# Patient Record
Sex: Female | Born: 1945
Health system: Southern US, Community
[De-identification: ages and names within clinical notes are randomized; demographics above are authoritative.]

## PROBLEM LIST (undated history)

## (undated) ENCOUNTER — Emergency Department (HOSPITAL_COMMUNITY): Payer: Medicare HMO

## (undated) DIAGNOSIS — I10 Essential (primary) hypertension: Secondary | ICD-10-CM

## (undated) DIAGNOSIS — R571 Hypovolemic shock: Secondary | ICD-10-CM

## (undated) DIAGNOSIS — I5032 Chronic diastolic (congestive) heart failure: Secondary | ICD-10-CM

## (undated) DIAGNOSIS — D649 Anemia, unspecified: Secondary | ICD-10-CM

## (undated) DIAGNOSIS — G40909 Epilepsy, unspecified, not intractable, without status epilepticus: Secondary | ICD-10-CM

## (undated) DIAGNOSIS — N189 Chronic kidney disease, unspecified: Secondary | ICD-10-CM

## (undated) DIAGNOSIS — R001 Bradycardia, unspecified: Secondary | ICD-10-CM

## (undated) DIAGNOSIS — I2510 Atherosclerotic heart disease of native coronary artery without angina pectoris: Secondary | ICD-10-CM

## (undated) DIAGNOSIS — R569 Unspecified convulsions: Secondary | ICD-10-CM

## (undated) DIAGNOSIS — E785 Hyperlipidemia, unspecified: Secondary | ICD-10-CM

## (undated) DIAGNOSIS — R51 Headache: Secondary | ICD-10-CM

## (undated) DIAGNOSIS — I959 Hypotension, unspecified: Secondary | ICD-10-CM

## (undated) DIAGNOSIS — K219 Gastro-esophageal reflux disease without esophagitis: Secondary | ICD-10-CM

## (undated) DIAGNOSIS — G459 Transient cerebral ischemic attack, unspecified: Secondary | ICD-10-CM

## (undated) DIAGNOSIS — M1990 Unspecified osteoarthritis, unspecified site: Secondary | ICD-10-CM

## (undated) DIAGNOSIS — I350 Nonrheumatic aortic (valve) stenosis: Secondary | ICD-10-CM

## (undated) DIAGNOSIS — R600 Localized edema: Secondary | ICD-10-CM

## (undated) DIAGNOSIS — R011 Cardiac murmur, unspecified: Secondary | ICD-10-CM

## (undated) DIAGNOSIS — I441 Atrioventricular block, second degree: Secondary | ICD-10-CM

## (undated) HISTORY — DX: Hyperlipidemia, unspecified: E78.5

## (undated) HISTORY — DX: Essential (primary) hypertension: I10

## (undated) HISTORY — DX: Nonrheumatic aortic (valve) stenosis: I35.0

## (undated) HISTORY — DX: Epilepsy, unspecified, not intractable, without status epilepticus: G40.909

## (undated) HISTORY — DX: Hypotension, unspecified: I95.9

## (undated) HISTORY — DX: Hypovolemic shock: R57.1

## (undated) HISTORY — PX: ABDOMINAL HYSTERECTOMY: SHX81

---
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
## 1997-09-07 ENCOUNTER — Encounter: Admission: RE | Admit: 1997-09-07 | Discharge: 1997-09-07 | Payer: Self-pay | Admitting: Internal Medicine

## 1997-11-10 ENCOUNTER — Encounter: Admission: RE | Admit: 1997-11-10 | Discharge: 1997-11-10 | Payer: Self-pay | Admitting: Hematology and Oncology

## 1997-12-12 ENCOUNTER — Ambulatory Visit (HOSPITAL_COMMUNITY): Admission: RE | Admit: 1997-12-12 | Discharge: 1997-12-12 | Payer: Self-pay | Admitting: Internal Medicine

## 1997-12-12 ENCOUNTER — Encounter: Admission: RE | Admit: 1997-12-12 | Discharge: 1997-12-12 | Payer: Self-pay | Admitting: Internal Medicine

## 1998-01-06 ENCOUNTER — Ambulatory Visit (HOSPITAL_COMMUNITY): Admission: RE | Admit: 1998-01-06 | Discharge: 1998-01-06 | Payer: Self-pay | Admitting: *Deleted

## 1998-01-17 ENCOUNTER — Encounter: Admission: RE | Admit: 1998-01-17 | Discharge: 1998-01-17 | Payer: Self-pay | Admitting: Hematology and Oncology

## 1999-05-14 ENCOUNTER — Encounter: Admission: RE | Admit: 1999-05-14 | Discharge: 1999-05-14 | Payer: Self-pay | Admitting: Internal Medicine

## 1999-05-25 ENCOUNTER — Encounter: Admission: RE | Admit: 1999-05-25 | Discharge: 1999-05-25 | Payer: Self-pay | Admitting: Internal Medicine

## 1999-06-14 ENCOUNTER — Encounter: Admission: RE | Admit: 1999-06-14 | Discharge: 1999-06-14 | Payer: Self-pay | Admitting: Internal Medicine

## 1999-07-23 ENCOUNTER — Encounter: Admission: RE | Admit: 1999-07-23 | Discharge: 1999-07-23 | Payer: Self-pay | Admitting: Internal Medicine

## 1999-07-30 ENCOUNTER — Encounter: Admission: RE | Admit: 1999-07-30 | Discharge: 1999-07-30 | Payer: Self-pay | Admitting: Internal Medicine

## 1999-08-02 ENCOUNTER — Encounter: Admission: RE | Admit: 1999-08-02 | Discharge: 1999-08-02 | Payer: Self-pay | Admitting: Internal Medicine

## 1999-08-07 ENCOUNTER — Encounter: Admission: RE | Admit: 1999-08-07 | Discharge: 1999-08-07 | Payer: Self-pay | Admitting: Hematology and Oncology

## 1999-08-21 ENCOUNTER — Encounter: Admission: RE | Admit: 1999-08-21 | Discharge: 1999-08-21 | Payer: Self-pay

## 1999-10-09 ENCOUNTER — Emergency Department (HOSPITAL_COMMUNITY): Admission: EM | Admit: 1999-10-09 | Discharge: 1999-10-09 | Payer: Self-pay | Admitting: Emergency Medicine

## 1999-10-09 ENCOUNTER — Encounter: Payer: Self-pay | Admitting: Emergency Medicine

## 1999-10-24 ENCOUNTER — Encounter: Admission: RE | Admit: 1999-10-24 | Discharge: 1999-10-24 | Payer: Self-pay | Admitting: Internal Medicine

## 1999-11-06 ENCOUNTER — Encounter: Admission: RE | Admit: 1999-11-06 | Discharge: 1999-11-06 | Payer: Self-pay | Admitting: Internal Medicine

## 1999-11-21 ENCOUNTER — Encounter: Admission: RE | Admit: 1999-11-21 | Discharge: 1999-11-21 | Payer: Self-pay | Admitting: Internal Medicine

## 1999-11-30 ENCOUNTER — Encounter: Admission: RE | Admit: 1999-11-30 | Discharge: 1999-11-30 | Payer: Self-pay | Admitting: Internal Medicine

## 2001-03-18 ENCOUNTER — Encounter: Admission: RE | Admit: 2001-03-18 | Discharge: 2001-03-18 | Payer: Self-pay | Admitting: Internal Medicine

## 2001-03-18 ENCOUNTER — Encounter: Payer: Self-pay | Admitting: Internal Medicine

## 2001-03-18 ENCOUNTER — Inpatient Hospital Stay (HOSPITAL_COMMUNITY): Admission: AD | Admit: 2001-03-18 | Discharge: 2001-03-20 | Payer: Self-pay | Admitting: Internal Medicine

## 2001-03-25 ENCOUNTER — Encounter: Admission: RE | Admit: 2001-03-25 | Discharge: 2001-03-25 | Payer: Self-pay | Admitting: Internal Medicine

## 2001-04-01 ENCOUNTER — Encounter: Admission: RE | Admit: 2001-04-01 | Discharge: 2001-04-01 | Payer: Self-pay | Admitting: Internal Medicine

## 2001-10-22 ENCOUNTER — Encounter: Admission: RE | Admit: 2001-10-22 | Discharge: 2001-10-22 | Payer: Self-pay | Admitting: Internal Medicine

## 2001-10-28 ENCOUNTER — Encounter: Payer: Self-pay | Admitting: Internal Medicine

## 2001-10-28 ENCOUNTER — Encounter: Admission: RE | Admit: 2001-10-28 | Discharge: 2001-10-28 | Payer: Self-pay | Admitting: Internal Medicine

## 2001-10-29 ENCOUNTER — Encounter: Admission: RE | Admit: 2001-10-29 | Discharge: 2001-10-29 | Payer: Self-pay | Admitting: Internal Medicine

## 2001-11-05 ENCOUNTER — Encounter: Admission: RE | Admit: 2001-11-05 | Discharge: 2001-11-05 | Payer: Self-pay | Admitting: Internal Medicine

## 2001-11-19 ENCOUNTER — Encounter: Admission: RE | Admit: 2001-11-19 | Discharge: 2001-11-19 | Payer: Self-pay | Admitting: Internal Medicine

## 2001-12-03 ENCOUNTER — Encounter: Admission: RE | Admit: 2001-12-03 | Discharge: 2001-12-03 | Payer: Self-pay | Admitting: Internal Medicine

## 2001-12-17 ENCOUNTER — Encounter: Admission: RE | Admit: 2001-12-17 | Discharge: 2001-12-17 | Payer: Self-pay | Admitting: Internal Medicine

## 2001-12-31 ENCOUNTER — Encounter: Admission: RE | Admit: 2001-12-31 | Discharge: 2001-12-31 | Payer: Self-pay | Admitting: Internal Medicine

## 2002-01-04 ENCOUNTER — Encounter: Admission: RE | Admit: 2002-01-04 | Discharge: 2002-01-04 | Payer: Self-pay | Admitting: Internal Medicine

## 2002-01-11 ENCOUNTER — Encounter: Admission: RE | Admit: 2002-01-11 | Discharge: 2002-01-11 | Payer: Self-pay | Admitting: Internal Medicine

## 2002-01-13 ENCOUNTER — Encounter: Admission: RE | Admit: 2002-01-13 | Discharge: 2002-01-13 | Payer: Self-pay | Admitting: Internal Medicine

## 2002-02-24 ENCOUNTER — Encounter: Admission: RE | Admit: 2002-02-24 | Discharge: 2002-02-24 | Payer: Self-pay | Admitting: Internal Medicine

## 2002-03-05 ENCOUNTER — Encounter: Admission: RE | Admit: 2002-03-05 | Discharge: 2002-03-05 | Payer: Self-pay | Admitting: Internal Medicine

## 2002-03-08 ENCOUNTER — Encounter: Admission: RE | Admit: 2002-03-08 | Discharge: 2002-03-08 | Payer: Self-pay | Admitting: Internal Medicine

## 2002-04-09 ENCOUNTER — Encounter: Admission: RE | Admit: 2002-04-09 | Discharge: 2002-04-09 | Payer: Self-pay | Admitting: Internal Medicine

## 2002-04-13 ENCOUNTER — Encounter: Admission: RE | Admit: 2002-04-13 | Discharge: 2002-04-13 | Payer: Self-pay | Admitting: Internal Medicine

## 2002-04-20 ENCOUNTER — Encounter: Admission: RE | Admit: 2002-04-20 | Discharge: 2002-04-20 | Payer: Self-pay | Admitting: Internal Medicine

## 2002-05-03 ENCOUNTER — Encounter: Admission: RE | Admit: 2002-05-03 | Discharge: 2002-05-03 | Payer: Self-pay | Admitting: Internal Medicine

## 2002-06-03 ENCOUNTER — Encounter: Admission: RE | Admit: 2002-06-03 | Discharge: 2002-06-03 | Payer: Self-pay | Admitting: Internal Medicine

## 2002-06-17 ENCOUNTER — Encounter: Admission: RE | Admit: 2002-06-17 | Discharge: 2002-06-17 | Payer: Self-pay | Admitting: Internal Medicine

## 2002-12-29 ENCOUNTER — Encounter: Admission: RE | Admit: 2002-12-29 | Discharge: 2002-12-29 | Payer: Self-pay | Admitting: Internal Medicine

## 2003-01-05 ENCOUNTER — Encounter: Admission: RE | Admit: 2003-01-05 | Discharge: 2003-01-05 | Payer: Self-pay | Admitting: Internal Medicine

## 2003-01-12 ENCOUNTER — Encounter: Admission: RE | Admit: 2003-01-12 | Discharge: 2003-01-12 | Payer: Self-pay | Admitting: Internal Medicine

## 2003-01-20 ENCOUNTER — Encounter: Admission: RE | Admit: 2003-01-20 | Discharge: 2003-01-20 | Payer: Self-pay | Admitting: Internal Medicine

## 2003-05-28 ENCOUNTER — Emergency Department (HOSPITAL_COMMUNITY): Admission: AD | Admit: 2003-05-28 | Discharge: 2003-05-28 | Payer: Self-pay | Admitting: Emergency Medicine

## 2003-07-15 ENCOUNTER — Encounter: Admission: RE | Admit: 2003-07-15 | Discharge: 2003-07-15 | Payer: Self-pay | Admitting: Internal Medicine

## 2003-07-29 ENCOUNTER — Encounter: Admission: RE | Admit: 2003-07-29 | Discharge: 2003-07-29 | Payer: Self-pay | Admitting: Internal Medicine

## 2003-08-01 ENCOUNTER — Ambulatory Visit (HOSPITAL_COMMUNITY): Admission: RE | Admit: 2003-08-01 | Discharge: 2003-08-01 | Payer: Self-pay | Admitting: Obstetrics and Gynecology

## 2003-08-24 ENCOUNTER — Encounter: Admission: RE | Admit: 2003-08-24 | Discharge: 2003-08-24 | Payer: Self-pay | Admitting: Internal Medicine

## 2004-01-01 ENCOUNTER — Emergency Department (HOSPITAL_COMMUNITY): Admission: EM | Admit: 2004-01-01 | Discharge: 2004-01-01 | Payer: Self-pay | Admitting: Emergency Medicine

## 2004-01-23 ENCOUNTER — Emergency Department (HOSPITAL_COMMUNITY): Admission: EM | Admit: 2004-01-23 | Discharge: 2004-01-23 | Payer: Self-pay | Admitting: Emergency Medicine

## 2004-10-26 ENCOUNTER — Ambulatory Visit: Payer: Self-pay | Admitting: Internal Medicine

## 2004-11-07 ENCOUNTER — Ambulatory Visit: Payer: Self-pay | Admitting: Internal Medicine

## 2004-11-09 ENCOUNTER — Ambulatory Visit (HOSPITAL_COMMUNITY): Admission: RE | Admit: 2004-11-09 | Discharge: 2004-11-09 | Payer: Self-pay | Admitting: *Deleted

## 2004-11-30 ENCOUNTER — Encounter: Admission: RE | Admit: 2004-11-30 | Discharge: 2004-11-30 | Payer: Self-pay | Admitting: Internal Medicine

## 2005-10-04 ENCOUNTER — Ambulatory Visit: Payer: Self-pay | Admitting: Internal Medicine

## 2005-10-08 ENCOUNTER — Ambulatory Visit: Payer: Self-pay | Admitting: Internal Medicine

## 2005-10-18 ENCOUNTER — Ambulatory Visit: Payer: Self-pay | Admitting: Hospitalist

## 2005-10-23 ENCOUNTER — Ambulatory Visit: Payer: Self-pay | Admitting: Hospitalist

## 2005-11-01 ENCOUNTER — Ambulatory Visit: Payer: Self-pay | Admitting: Internal Medicine

## 2005-11-05 ENCOUNTER — Ambulatory Visit: Payer: Self-pay | Admitting: Internal Medicine

## 2005-11-21 ENCOUNTER — Ambulatory Visit: Payer: Self-pay | Admitting: Internal Medicine

## 2006-01-21 ENCOUNTER — Ambulatory Visit: Payer: Self-pay | Admitting: Internal Medicine

## 2006-02-03 ENCOUNTER — Encounter: Admission: RE | Admit: 2006-02-03 | Discharge: 2006-02-17 | Payer: Self-pay | Admitting: *Deleted

## 2006-02-10 ENCOUNTER — Ambulatory Visit (HOSPITAL_COMMUNITY): Admission: RE | Admit: 2006-02-10 | Discharge: 2006-02-10 | Payer: Self-pay | Admitting: Internal Medicine

## 2006-02-13 ENCOUNTER — Ambulatory Visit: Payer: Self-pay | Admitting: Internal Medicine

## 2006-02-26 ENCOUNTER — Ambulatory Visit: Payer: Self-pay | Admitting: Hospitalist

## 2006-03-12 ENCOUNTER — Encounter (INDEPENDENT_AMBULATORY_CARE_PROVIDER_SITE_OTHER): Payer: Self-pay | Admitting: Interventional Cardiology

## 2006-03-12 ENCOUNTER — Ambulatory Visit: Admission: RE | Admit: 2006-03-12 | Discharge: 2006-03-12 | Payer: Self-pay | Admitting: Internal Medicine

## 2006-04-07 ENCOUNTER — Telehealth (INDEPENDENT_AMBULATORY_CARE_PROVIDER_SITE_OTHER): Payer: Self-pay | Admitting: *Deleted

## 2006-04-14 ENCOUNTER — Ambulatory Visit: Payer: Self-pay | Admitting: Hospitalist

## 2006-04-14 ENCOUNTER — Encounter: Payer: Self-pay | Admitting: Internal Medicine

## 2006-04-16 ENCOUNTER — Telehealth (INDEPENDENT_AMBULATORY_CARE_PROVIDER_SITE_OTHER): Payer: Self-pay | Admitting: Hospitalist

## 2006-05-12 ENCOUNTER — Telehealth (INDEPENDENT_AMBULATORY_CARE_PROVIDER_SITE_OTHER): Payer: Self-pay | Admitting: *Deleted

## 2006-05-13 ENCOUNTER — Encounter (INDEPENDENT_AMBULATORY_CARE_PROVIDER_SITE_OTHER): Payer: Self-pay | Admitting: *Deleted

## 2006-05-13 ENCOUNTER — Ambulatory Visit: Payer: Self-pay | Admitting: Hospitalist

## 2006-05-13 LAB — CONVERTED CEMR LAB: Phenobarbital: 33.2 ug/mL (ref 15.0–40.0)

## 2006-05-23 ENCOUNTER — Telehealth: Payer: Self-pay | Admitting: *Deleted

## 2006-05-23 LAB — CONVERTED CEMR LAB
ALT: 20 units/L (ref 0–35)
AST: 22 units/L (ref 0–37)
Albumin: 4.4 g/dL (ref 3.5–5.2)
Alkaline Phosphatase: 92 units/L (ref 39–117)
BUN: 34 mg/dL — ABNORMAL HIGH (ref 6–23)
CO2: 17 meq/L — ABNORMAL LOW (ref 19–32)
Calcium: 9.2 mg/dL (ref 8.4–10.5)
Chloride: 108 meq/L (ref 96–112)
Creatinine, Ser: 1.37 mg/dL — ABNORMAL HIGH (ref 0.40–1.20)
Glucose, Bld: 91 mg/dL (ref 70–99)
Potassium: 4.8 meq/L (ref 3.5–5.3)
Sodium: 142 meq/L (ref 135–145)
Total Bilirubin: 0.3 mg/dL (ref 0.3–1.2)
Total Protein: 7.2 g/dL (ref 6.0–8.3)

## 2006-06-04 ENCOUNTER — Ambulatory Visit: Payer: Self-pay | Admitting: Internal Medicine

## 2006-06-04 ENCOUNTER — Encounter (INDEPENDENT_AMBULATORY_CARE_PROVIDER_SITE_OTHER): Payer: Self-pay | Admitting: *Deleted

## 2006-06-04 DIAGNOSIS — M47816 Spondylosis without myelopathy or radiculopathy, lumbar region: Secondary | ICD-10-CM | POA: Insufficient documentation

## 2006-06-04 DIAGNOSIS — G40909 Epilepsy, unspecified, not intractable, without status epilepticus: Secondary | ICD-10-CM

## 2006-06-04 DIAGNOSIS — I10 Essential (primary) hypertension: Secondary | ICD-10-CM | POA: Insufficient documentation

## 2006-06-04 HISTORY — DX: Epilepsy, unspecified, not intractable, without status epilepticus: G40.909

## 2006-06-05 LAB — CONVERTED CEMR LAB
ALT: 15 units/L (ref 0–35)
AST: 20 units/L (ref 0–37)
Albumin: 4.5 g/dL (ref 3.5–5.2)
Alkaline Phosphatase: 79 units/L (ref 39–117)
BUN: 18 mg/dL (ref 6–23)
CO2: 23 meq/L (ref 19–32)
Calcium: 9 mg/dL (ref 8.4–10.5)
Chloride: 103 meq/L (ref 96–112)
Creatinine, Ser: 1.2 mg/dL (ref 0.40–1.20)
Glucose, Bld: 82 mg/dL (ref 70–99)
Potassium: 4.1 meq/L (ref 3.5–5.3)
Sodium: 139 meq/L (ref 135–145)
Total Bilirubin: 0.4 mg/dL (ref 0.3–1.2)
Total Protein: 7.5 g/dL (ref 6.0–8.3)

## 2006-07-29 ENCOUNTER — Ambulatory Visit: Payer: Self-pay | Admitting: Internal Medicine

## 2006-08-06 ENCOUNTER — Encounter (INDEPENDENT_AMBULATORY_CARE_PROVIDER_SITE_OTHER): Payer: Self-pay | Admitting: *Deleted

## 2006-08-06 ENCOUNTER — Ambulatory Visit: Payer: Self-pay | Admitting: Internal Medicine

## 2006-08-08 LAB — CONVERTED CEMR LAB
Cholesterol: 192 mg/dL (ref 0–200)
HDL: 60 mg/dL (ref >39)
LDL Cholesterol: 117 mg/dL — ABNORMAL HIGH (ref 0–99)
Phenobarbital: 39.2 ug/mL (ref 15.0–40.0)
Total CHOL/HDL Ratio: 3.2
Triglycerides: 73 mg/dL (ref <150)
VLDL: 15 mg/dL (ref 0–40)

## 2006-08-12 ENCOUNTER — Ambulatory Visit: Payer: Self-pay | Admitting: Internal Medicine

## 2006-08-12 ENCOUNTER — Encounter (INDEPENDENT_AMBULATORY_CARE_PROVIDER_SITE_OTHER): Payer: Self-pay | Admitting: *Deleted

## 2006-08-12 ENCOUNTER — Ambulatory Visit (HOSPITAL_COMMUNITY): Admission: RE | Admit: 2006-08-12 | Discharge: 2006-08-12 | Payer: Self-pay | Admitting: Internal Medicine

## 2006-08-12 DIAGNOSIS — R9431 Abnormal electrocardiogram [ECG] [EKG]: Secondary | ICD-10-CM | POA: Insufficient documentation

## 2006-08-12 DIAGNOSIS — I498 Other specified cardiac arrhythmias: Secondary | ICD-10-CM | POA: Insufficient documentation

## 2006-09-03 ENCOUNTER — Ambulatory Visit: Payer: Self-pay | Admitting: *Deleted

## 2006-09-03 ENCOUNTER — Encounter (INDEPENDENT_AMBULATORY_CARE_PROVIDER_SITE_OTHER): Payer: Self-pay | Admitting: *Deleted

## 2006-09-09 ENCOUNTER — Encounter (INDEPENDENT_AMBULATORY_CARE_PROVIDER_SITE_OTHER): Payer: Self-pay | Admitting: *Deleted

## 2006-10-27 ENCOUNTER — Ambulatory Visit: Payer: Self-pay | Admitting: Internal Medicine

## 2006-10-27 ENCOUNTER — Encounter (INDEPENDENT_AMBULATORY_CARE_PROVIDER_SITE_OTHER): Payer: Self-pay | Admitting: *Deleted

## 2006-10-28 LAB — CONVERTED CEMR LAB
ALT: 18 units/L (ref 0–35)
AST: 19 units/L (ref 0–37)
Albumin: 4.7 g/dL (ref 3.5–5.2)
Alkaline Phosphatase: 72 units/L (ref 39–117)
BUN: 26 mg/dL — ABNORMAL HIGH (ref 6–23)
CO2: 21 meq/L (ref 19–32)
Calcium: 9.3 mg/dL (ref 8.4–10.5)
Chloride: 107 meq/L (ref 96–112)
Creatinine, Ser: 1.37 mg/dL — ABNORMAL HIGH (ref 0.40–1.20)
Glucose, Bld: 93 mg/dL (ref 70–99)
Phenobarbital: 34 ug/mL (ref 15.0–40.0)
Potassium: 4.7 meq/L (ref 3.5–5.3)
Sodium: 141 meq/L (ref 135–145)
Total Bilirubin: 0.3 mg/dL (ref 0.3–1.2)
Total Protein: 7.7 g/dL (ref 6.0–8.3)

## 2006-11-01 ENCOUNTER — Ambulatory Visit (HOSPITAL_COMMUNITY): Admission: RE | Admit: 2006-11-01 | Discharge: 2006-11-01 | Payer: Self-pay | Admitting: *Deleted

## 2006-11-05 ENCOUNTER — Encounter (INDEPENDENT_AMBULATORY_CARE_PROVIDER_SITE_OTHER): Payer: Self-pay | Admitting: *Deleted

## 2006-11-05 ENCOUNTER — Ambulatory Visit (HOSPITAL_COMMUNITY): Admission: RE | Admit: 2006-11-05 | Discharge: 2006-11-05 | Payer: Self-pay | Admitting: Internal Medicine

## 2006-11-07 ENCOUNTER — Encounter (INDEPENDENT_AMBULATORY_CARE_PROVIDER_SITE_OTHER): Payer: Self-pay | Admitting: *Deleted

## 2006-11-14 ENCOUNTER — Encounter (INDEPENDENT_AMBULATORY_CARE_PROVIDER_SITE_OTHER): Payer: Self-pay | Admitting: *Deleted

## 2006-11-14 ENCOUNTER — Ambulatory Visit: Payer: Self-pay | Admitting: Internal Medicine

## 2006-11-26 ENCOUNTER — Encounter (INDEPENDENT_AMBULATORY_CARE_PROVIDER_SITE_OTHER): Payer: Self-pay | Admitting: *Deleted

## 2006-12-22 ENCOUNTER — Telehealth (INDEPENDENT_AMBULATORY_CARE_PROVIDER_SITE_OTHER): Payer: Self-pay | Admitting: *Deleted

## 2007-03-27 ENCOUNTER — Telehealth (INDEPENDENT_AMBULATORY_CARE_PROVIDER_SITE_OTHER): Payer: Self-pay | Admitting: *Deleted

## 2007-07-22 ENCOUNTER — Encounter (INDEPENDENT_AMBULATORY_CARE_PROVIDER_SITE_OTHER): Payer: Self-pay | Admitting: *Deleted

## 2007-07-22 ENCOUNTER — Ambulatory Visit: Payer: Self-pay | Admitting: Internal Medicine

## 2007-07-22 ENCOUNTER — Telehealth: Payer: Self-pay | Admitting: *Deleted

## 2007-07-22 DIAGNOSIS — M79609 Pain in unspecified limb: Secondary | ICD-10-CM | POA: Insufficient documentation

## 2007-07-23 ENCOUNTER — Encounter: Payer: Self-pay | Admitting: Internal Medicine

## 2007-07-23 ENCOUNTER — Ambulatory Visit (HOSPITAL_COMMUNITY): Admission: RE | Admit: 2007-07-23 | Discharge: 2007-07-23 | Payer: Self-pay | Admitting: Internal Medicine

## 2007-07-23 ENCOUNTER — Ambulatory Visit: Payer: Self-pay | Admitting: Vascular Surgery

## 2007-07-23 LAB — CONVERTED CEMR LAB
ALT: 16 units/L (ref 0–35)
AST: 21 units/L (ref 0–37)
Albumin: 4.3 g/dL (ref 3.5–5.2)
Alkaline Phosphatase: 92 units/L (ref 39–117)
BUN: 39 mg/dL — ABNORMAL HIGH (ref 6–23)
CO2: 22 meq/L (ref 19–32)
Calcium: 8.9 mg/dL (ref 8.4–10.5)
Chloride: 105 meq/L (ref 96–112)
Creatinine, Ser: 1.37 mg/dL — ABNORMAL HIGH (ref 0.40–1.20)
Glucose, Bld: 90 mg/dL (ref 70–99)
Phenobarbital: 29.8 ug/mL (ref 15.0–40.0)
Potassium: 5.3 meq/L (ref 3.5–5.3)
Sodium: 139 meq/L (ref 135–145)
Total Bilirubin: 0.2 mg/dL — ABNORMAL LOW (ref 0.3–1.2)
Total Protein: 7.8 g/dL (ref 6.0–8.3)

## 2007-07-28 ENCOUNTER — Ambulatory Visit: Payer: Self-pay | Admitting: *Deleted

## 2007-07-28 ENCOUNTER — Encounter (INDEPENDENT_AMBULATORY_CARE_PROVIDER_SITE_OTHER): Payer: Self-pay | Admitting: *Deleted

## 2007-07-29 LAB — CONVERTED CEMR LAB
BUN: 41 mg/dL — ABNORMAL HIGH (ref 6–23)
CO2: 23 meq/L (ref 19–32)
Calcium: 8.9 mg/dL (ref 8.4–10.5)
Chloride: 106 meq/L (ref 96–112)
Creatinine, Ser: 1.37 mg/dL — ABNORMAL HIGH (ref 0.40–1.20)
Glucose, Bld: 67 mg/dL — ABNORMAL LOW (ref 70–99)
Potassium: 4.7 meq/L (ref 3.5–5.3)
Sodium: 142 meq/L (ref 135–145)

## 2007-09-18 ENCOUNTER — Telehealth: Payer: Self-pay | Admitting: Internal Medicine

## 2007-12-17 ENCOUNTER — Ambulatory Visit: Payer: Self-pay | Admitting: Internal Medicine

## 2008-01-25 ENCOUNTER — Telehealth: Payer: Self-pay | Admitting: Internal Medicine

## 2008-03-28 ENCOUNTER — Telehealth: Payer: Self-pay | Admitting: Internal Medicine

## 2008-05-23 ENCOUNTER — Telehealth: Payer: Self-pay | Admitting: Internal Medicine

## 2008-06-28 ENCOUNTER — Encounter: Payer: Self-pay | Admitting: Internal Medicine

## 2008-06-28 ENCOUNTER — Ambulatory Visit: Payer: Self-pay | Admitting: *Deleted

## 2008-06-28 ENCOUNTER — Encounter (INDEPENDENT_AMBULATORY_CARE_PROVIDER_SITE_OTHER): Payer: Self-pay | Admitting: *Deleted

## 2008-06-28 DIAGNOSIS — R609 Edema, unspecified: Secondary | ICD-10-CM | POA: Insufficient documentation

## 2008-06-28 LAB — CONVERTED CEMR LAB
ALT: 20 units/L (ref 0–35)
AST: 25 units/L (ref 0–37)
Albumin: 4.2 g/dL (ref 3.5–5.2)
Alkaline Phosphatase: 86 units/L (ref 39–117)
BUN: 20 mg/dL (ref 6–23)
CO2: 22 meq/L (ref 19–32)
Calcium: 8.8 mg/dL (ref 8.4–10.5)
Chloride: 105 meq/L (ref 96–112)
Cholesterol: 244 mg/dL — ABNORMAL HIGH (ref 0–200)
Creatinine, Ser: 1.14 mg/dL (ref 0.40–1.20)
GFR calc Af Amer: 58 mL/min — ABNORMAL LOW (ref >60)
GFR calc non Af Amer: 48 mL/min — ABNORMAL LOW (ref >60)
Glucose, Bld: 96 mg/dL (ref 70–99)
HDL: 71 mg/dL (ref >39)
LDL Cholesterol: 147 mg/dL — ABNORMAL HIGH (ref 0–99)
Potassium: 4.3 meq/L (ref 3.5–5.3)
Sodium: 140 meq/L (ref 135–145)
Total Bilirubin: 0.3 mg/dL (ref 0.3–1.2)
Total CHOL/HDL Ratio: 3.4
Total Protein: 7.1 g/dL (ref 6.0–8.3)
Triglycerides: 130 mg/dL (ref <150)
VLDL: 26 mg/dL (ref 0–40)

## 2008-09-26 ENCOUNTER — Telehealth: Payer: Self-pay | Admitting: Internal Medicine

## 2009-04-03 ENCOUNTER — Telehealth: Payer: Self-pay | Admitting: Internal Medicine

## 2009-05-29 ENCOUNTER — Telehealth: Payer: Self-pay | Admitting: Internal Medicine

## 2009-05-30 ENCOUNTER — Telehealth: Payer: Self-pay | Admitting: Internal Medicine

## 2009-06-01 ENCOUNTER — Encounter: Payer: Self-pay | Admitting: Internal Medicine

## 2009-06-01 ENCOUNTER — Ambulatory Visit: Payer: Self-pay | Admitting: Internal Medicine

## 2009-06-01 LAB — CONVERTED CEMR LAB
ALT: 19 U/L (ref 0–35)
AST: 23 U/L (ref 0–37)
Albumin: 4.4 g/dL (ref 3.5–5.2)
Alkaline Phosphatase: 73 U/L (ref 39–117)
BUN: 21 mg/dL (ref 6–23)
Basophils Absolute: 0 K/uL (ref 0.0–0.1)
Basophils Relative: 1 % (ref 0–1)
CO2: 23 meq/L (ref 19–32)
Calcium: 9.2 mg/dL (ref 8.4–10.5)
Chloride: 104 meq/L (ref 96–112)
Creatinine, Ser: 1.39 mg/dL — ABNORMAL HIGH (ref 0.40–1.20)
Eosinophils Absolute: 0.1 K/uL (ref 0.0–0.7)
Eosinophils Relative: 3 % (ref 0–5)
Glucose, Bld: 88 mg/dL (ref 70–99)
HCT: 41.3 % (ref 36.0–46.0)
Hemoglobin: 13.2 g/dL (ref 12.0–15.0)
Lymphocytes Relative: 41 % (ref 12–46)
Lymphs Abs: 1.8 K/uL (ref 0.7–4.0)
MCHC: 32 g/dL (ref 30.0–36.0)
MCV: 85.9 fL (ref >=78.0)
Monocytes Absolute: 0.5 K/uL (ref 0.1–1.0)
Monocytes Relative: 10 % (ref 3–12)
Neutro Abs: 2 K/uL (ref 1.7–7.7)
Neutrophils Relative %: 45 % (ref 43–77)
Phenobarbital: 26.9 ug/mL (ref 15.0–40.0)
Platelets: 243 K/uL (ref 150–400)
Potassium: 3.8 meq/L (ref 3.5–5.3)
RBC: 4.81 M/uL (ref 3.87–5.11)
RDW: 13.6 % (ref 11.5–15.5)
Sodium: 142 meq/L (ref 135–145)
Total Bilirubin: 0.3 mg/dL (ref 0.3–1.2)
Total Protein: 7.5 g/dL (ref 6.0–8.3)
WBC: 4.3 10*3/microliter (ref 4.0–10.5)

## 2009-06-05 ENCOUNTER — Telehealth: Payer: Self-pay | Admitting: Internal Medicine

## 2009-06-08 ENCOUNTER — Ambulatory Visit (HOSPITAL_COMMUNITY): Admission: RE | Admit: 2009-06-08 | Discharge: 2009-06-08 | Payer: Self-pay | Admitting: Internal Medicine

## 2009-06-08 ENCOUNTER — Ambulatory Visit: Payer: Self-pay | Admitting: Internal Medicine

## 2009-06-13 LAB — CONVERTED CEMR LAB
BUN: 19 mg/dL (ref 6–23)
CO2: 23 meq/L (ref 19–32)
Calcium: 8.8 mg/dL (ref 8.4–10.5)
Chloride: 105 meq/L (ref 96–112)
Cholesterol: 185 mg/dL (ref 0–200)
Creatinine, Ser: 1.33 mg/dL — ABNORMAL HIGH (ref 0.40–1.20)
Glucose, Bld: 86 mg/dL (ref 70–99)
HDL: 66 mg/dL (ref >39)
LDL Cholesterol: 104 mg/dL — ABNORMAL HIGH (ref 0–99)
Potassium: 4.1 meq/L (ref 3.5–5.3)
Sodium: 142 meq/L (ref 135–145)
Total CHOL/HDL Ratio: 2.8
Triglycerides: 73 mg/dL (ref <150)
VLDL: 15 mg/dL (ref 0–40)

## 2009-07-13 ENCOUNTER — Telehealth: Payer: Self-pay | Admitting: Internal Medicine

## 2009-07-28 ENCOUNTER — Telehealth: Payer: Self-pay | Admitting: Internal Medicine

## 2009-07-31 ENCOUNTER — Ambulatory Visit: Payer: Self-pay | Admitting: Internal Medicine

## 2009-07-31 DIAGNOSIS — R269 Unspecified abnormalities of gait and mobility: Secondary | ICD-10-CM | POA: Insufficient documentation

## 2009-08-01 LAB — CONVERTED CEMR LAB
Anti Nuclear Antibody(ANA): NEGATIVE
Sed Rate: 18 mm/hr (ref 0–22)
Total CK: 200 units/L — ABNORMAL HIGH (ref 7–177)

## 2009-08-15 ENCOUNTER — Encounter: Admission: RE | Admit: 2009-08-15 | Discharge: 2009-11-13 | Payer: Self-pay | Admitting: Internal Medicine

## 2009-08-17 ENCOUNTER — Encounter: Payer: Self-pay | Admitting: Internal Medicine

## 2009-08-24 ENCOUNTER — Telehealth: Payer: Self-pay | Admitting: Internal Medicine

## 2009-08-31 ENCOUNTER — Encounter: Payer: Self-pay | Admitting: Internal Medicine

## 2009-09-01 ENCOUNTER — Encounter: Payer: Self-pay | Admitting: Internal Medicine

## 2009-09-12 ENCOUNTER — Encounter: Payer: Self-pay | Admitting: Internal Medicine

## 2009-09-18 ENCOUNTER — Ambulatory Visit: Payer: Self-pay | Admitting: Internal Medicine

## 2009-09-25 ENCOUNTER — Telehealth: Payer: Self-pay | Admitting: Internal Medicine

## 2009-10-03 ENCOUNTER — Encounter: Payer: Self-pay | Admitting: Internal Medicine

## 2009-10-10 ENCOUNTER — Telehealth: Payer: Self-pay | Admitting: *Deleted

## 2009-11-09 ENCOUNTER — Encounter: Payer: Self-pay | Admitting: Internal Medicine

## 2009-11-14 ENCOUNTER — Encounter: Payer: Self-pay | Admitting: Internal Medicine

## 2009-11-14 ENCOUNTER — Encounter
Admission: RE | Admit: 2009-11-14 | Discharge: 2010-01-16 | Payer: Self-pay | Source: Home / Self Care | Admitting: Internal Medicine

## 2009-12-19 ENCOUNTER — Encounter: Payer: Self-pay | Admitting: Internal Medicine

## 2010-01-17 ENCOUNTER — Encounter
Admission: RE | Admit: 2010-01-17 | Discharge: 2010-01-23 | Payer: Self-pay | Source: Home / Self Care | Admitting: Internal Medicine

## 2010-02-08 ENCOUNTER — Ambulatory Visit: Payer: Self-pay | Admitting: Internal Medicine

## 2010-02-08 LAB — CONVERTED CEMR LAB
ALT: 15 units/L (ref 0–35)
AST: 20 units/L (ref 0–37)
Albumin: 4.3 g/dL (ref 3.5–5.2)
Alkaline Phosphatase: 90 units/L (ref 39–117)
BUN: 43 mg/dL — ABNORMAL HIGH (ref 6–23)
CO2: 24 meq/L (ref 19–32)
Calcium: 9.2 mg/dL (ref 8.4–10.5)
Chloride: 106 meq/L (ref 96–112)
Creatinine, Ser: 1.43 mg/dL — ABNORMAL HIGH (ref 0.40–1.20)
Glucose, Bld: 89 mg/dL (ref 70–99)
HCT: 39.2 % (ref 36.0–46.0)
Hemoglobin: 12.6 g/dL (ref 12.0–15.0)
MCHC: 32.1 g/dL (ref 30.0–36.0)
MCV: 85.6 fL (ref 78.0–100.0)
Phenobarbital: 36.7 ug/mL (ref 15.0–40.0)
Platelets: 244 10*3/uL (ref 150–400)
Potassium: 4.5 meq/L (ref 3.5–5.3)
RBC: 4.58 M/uL (ref 3.87–5.11)
RDW: 14.5 % (ref 11.5–15.5)
Sodium: 143 meq/L (ref 135–145)
TSH: 1.991 microintl units/mL (ref 0.350–4.50)
Total Bilirubin: 0.3 mg/dL (ref 0.3–1.2)
Total CK: 109 units/L (ref 7–177)
Total Protein: 7.4 g/dL (ref 6.0–8.3)
WBC: 4 10*3/uL (ref 4.0–10.5)

## 2010-03-09 ENCOUNTER — Emergency Department (HOSPITAL_COMMUNITY)
Admission: EM | Admit: 2010-03-09 | Discharge: 2010-03-09 | Payer: Self-pay | Source: Home / Self Care | Admitting: Emergency Medicine

## 2010-03-12 LAB — POCT I-STAT, CHEM 8
BUN: 31 mg/dL — ABNORMAL HIGH (ref 6–23)
Calcium, Ion: 1.11 mmol/L — ABNORMAL LOW (ref 1.12–1.32)
Chloride: 105 mEq/L (ref 96–112)
Creatinine, Ser: 1.4 mg/dL — ABNORMAL HIGH (ref 0.4–1.2)
Glucose, Bld: 93 mg/dL (ref 70–99)
HCT: 39 % (ref 36.0–46.0)
Hemoglobin: 13.3 g/dL (ref 12.0–15.0)
Potassium: 4 mEq/L (ref 3.5–5.1)
Sodium: 138 mEq/L (ref 135–145)
TCO2: 27 mmol/L (ref 0–100)

## 2010-03-13 ENCOUNTER — Ambulatory Visit: Admission: RE | Admit: 2010-03-13 | Discharge: 2010-03-13 | Payer: Self-pay | Source: Home / Self Care

## 2010-03-20 NOTE — Progress Notes (Signed)
Summary: med refill/gp  Phone Note Refill Request Message from:  Fax from Pharmacy on April 03, 2009 3:45 PM  Refills Requested: Medication #1:  PHENOBARBITAL 97.2 MG TABS Take two tablets by mouth at bed time   Last Refilled: 02/26/2009  Method Requested: Electronic Initial call taken by: Morrison Old RN,  April 03, 2009 3:45 PM    Prescriptions: PHENOBARBITAL 97.2 MG TABS (PHENOBARBITAL) Take two tablets by mouth at bed time  #62 x 6   Entered and Authorized by:   Niel Hummer MD   Signed by:   Niel Hummer MD on 04/03/2009   Method used:   Telephoned to ...       [facility] [address] [phone]* (retail)       [address]       [place], [place]  [address]       Ph: [phone] or [phone]       Fax: [phone]   RxID:   [id number]   Appended Document: med refill/gp Above Rx refill request faxed to [facility].

## 2010-03-20 NOTE — Assessment & Plan Note (Signed)
Summary: f/u, checkup/pcp-Sherry [name]/hla   Vital Signs:  Patient profile:   65 year old female Height:      61 inches (154.94 cm) Temp:     97.5 degrees F (36.39 degrees C) oral Pulse rate:   66 / minute BP sitting:   138 / 74  (right arm) Cuff size:   regular  Vitals Entered By: Lucky Rathke NT II (September 18, 2009 1:38 PM) CC: BILATERAL LEG PAIN-CHRONIC  / MEDICATIO REFILL(WANT TO CHANGE PHARM. TO [facility])/ PATIENT NEW TO DR. Is Patient Diabetic? No Pain Assessment Patient in pain? yes     Location: LEGS Intensity:       10 Type: THROB/STING Onset of pain  Chronic  Have you ever been in a relationship where you felt threatened, hurt or afraid?No   Does patient need assistance? Functional Status Self care Ambulation Normal   Primary Care Provider:  Niel Hummer [name]  CC:  BILATERAL LEG PAIN-CHRONIC  / MEDICATIO REFILL(WANT TO CHANGE PHARM. TO [facility])/ PATIENT NEW TO DR.Marland [name]  History of Present Illness: Patient presents today for 1 month follow-up.  At her last clinic visit she was complaining of leg weakness and being unable to walk.  Patient states this problem started approximately 3 months ago.  She is unable to fully extend her legs while sitting or standing and this was not allowing her to walk.  At an earlier clinic visit, it was suggested to her that she try walking 30 minutes a day to help strengthen her legs, but she said this only made her feel worse.  At her last clinic visit she was prescribed outpatient PT/OT.  She states that enjoys physical therapy and thinks that it is helping.  Has met with OT and they have emphasized how to stay safer in the home.  Patient reports being more careful and did not fall even once this past week.    Patient states that she has had troubles falling with walking for the past few years, so she has used a walker to get around the house.  Typically falls about once a week.  None this past week.  Never  any injuries (besides bruising) that result from the falls.   Patient with h/o epilepsy - patient states it has been several years since her last seizure, but still has episodes of "lost time", the last one occuring approximately one month ago.  She states she is much improved with phenobarbital, which she has been on for several years.  She does not have a neurologist.  Patient with a h/o HTN.  She states that she was having headaches before her last clinic visit that she believed were related to her high blood pressure.  She has had none in the past month.    Today the patient denies any SOB.  Some soreness of arm and chest muscles with getting up to standing.  No N/V/abd pain.  Some constipation.  No urinary symptoms.     Preventive Screening-Counseling & Management  Alcohol-Tobacco     Smoking Status: quit     Packs/Day: 0.2     Year Started: LATE 30'S- 1pk/mth     Year Quit: 2005  Caffeine-Diet-Exercise     Does Patient Exercise: no  Current Medications (verified): 1)  Norvasc 10 Mg Tabs (Amlodipine Besylate) .... Take 1 Tablet By Mouth Once A Day 2)  Lisinopril 40 Mg Tabs (Lisinopril) .... Take Two Pills By Mouth Once A Day. 3)  Lopressor 50 Mg Tabs (Metoprolol Tartrate) .... Take 1/2 Tablet By Mouth Two Times A Day 4)  Simvastatin 20 Mg Tabs (Simvastatin) .... Take 1 Tablet By Mouth Once A Day 5)  Phenobarbital 97.2 Mg Tabs (Phenobarbital) .... Take Two Tablets By Mouth At Bed Time 6)  Aspirin 81 Mg Tbec (Aspirin) .... Take 1 Tablet By Mouth Once A Day 7)  Calcarb 600 1500 Mg Tabs (Calcium Carbonate) .... Take 1 Tablet By Mouth Two Times A Day 8)  Furosemide 40 Mg  Tabs (Furosemide) .... Take 1 Tablet By Mouth Once A Day 9)  Aldactone 25 Mg Tabs (Spironolactone) .... Take 1 Tablet By Mouth Daily.  Allergies (verified): No Known Drug Allergies  Past History:  Past Surgical History: Hysterectomy - 30 years ago  Family History: Father: Died from prostate cancer Mother:  Cardiomegaly, died of stroke at 64 y/o No FH of diabetes.  Social History: Former Smoker (stopped in 2005 but smoked for 20+ yrs). 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  Married and lives with husband.  Two adult children.  Review of Systems       see HPI  Physical Exam  General:  alert, cooperative to examination, and overweight-appearing.  alert and cooperative to examination.   Head:  no abnormalities observed.  no abnormalities observed.   Eyes:  pupils equal, pupils round, and pupils reactive to light.  pupils equal, pupils round, and pupils reactive to light.   Ears:  no external deformities.  no external deformities.   Nose:  no external deformity, no external erythema, and no nasal discharge.  no external deformity, no external erythema, and no nasal discharge.   Mouth:  pharynx pink and moist, no erythema, no exudates, poor dentition, and teeth missing.  pharynx pink and moist, no erythema, and no exudates.   Neck:  supple, full ROM, and no masses.  supple, full ROM, and no masses.   Lungs:  normal respiratory effort, normal breath sounds, no crackles, and no wheezes.  normal respiratory effort, normal breath sounds, no crackles, and no wheezes.   Heart:  normal rate and regular rhythm.  III/VI holosystolic murmur radiating to carotids Abdomen:  soft, non-tender, and normal bowel sounds.  soft, non-tender, and normal bowel sounds.   Msk:  See knee exam for specific abnormalities.  Otherwise normal ROM in other joints, no joint tenderness or swelling.   Pulses:  R dorsalis pedis normal and L dorsalis pedis normal.  Extremities:  trace left pedal edema and trace right pedal edema.  .   Neurologic:  alert & oriented X3, cranial nerves II-XII intact, strength normal in all extremities, sensation intact to light touch.  alert & oriented X3, cranial nerves II-XII intact, strength normal in all extremities, and sensation intact to light  touch.   Skin:  turgor normal.  turgor normal.  turgor normal.   Cervical Nodes:  no anterior cervical adenopathy.  no anterior cervical adenopathy.  no anterior cervical adenopathy.   Psych:  Oriented X3, memory intact for recent and remote, normally interactive, good eye contact, not anxious appearing, and not depressed appearing.  Oriented X3, memory intact for recent and remote, normally interactive, good eye contact, not anxious appearing, and not depressed appearing.     Knee Exam  General:    obese.    Gait:    Patient could not walk in the exam room because she did not have her walker.  Skin:    healthy-R and  healthy-L.    Inspection:    no deformities, swelling  Palpation:    not ttp  Knee Exam:    Right:    Inspection/Palpation:  patient cannot fully extend the knee with active or passive extension 2/2 pain.    Left:    Inspection/Palpation:  patient cannot fully extend the knee with active or passive extension 2/2 pain.     Impression & Recommendations:  Problem # 1:  ABNORMALITY OF GAIT (ICD-781.2) Patient presents today improved from her prior clinic visit.  It appears from PT notes that she is meeting her short-term PT goals.  She has also worked with OT and is following their recommendations at home to make herself safer.  I recommended to her that she continue to work with PT as planned for the next 4 weeks and we will see her back in one month to discuss her progress.  She was able to stand with assistance at this clinic visit however she was still unable to fully extend her legs.  I hope that with further PT she will be able to improve her functional status, walking, etc.    Labwork done at the last visit was normal except for slightly elevated CK which may be explained by her recent falls.    Problem # 2:  HYPERTENSION (ICD-401.9) The patient has a much improved BP today and is tolerating her medications.  No changes in management at this time.    Her  updated medication list for this problem includes:    Norvasc 10 Mg Tabs (Amlodipine besylate) .Sherry [name]... Take 1 tablet by mouth once a day    Lisinopril 40 Mg Tabs (Lisinopril) .Sherry [name]... Take two pills by mouth once a day.    Lopressor 50 Mg Tabs (Metoprolol tartrate) .Sherry [name]... Take 1/2 tablet by mouth two times a day    Furosemide 40 Mg Tabs (Furosemide) .Sherry [name]... Take 1 tablet by mouth once a day    Aldactone 25 Mg Tabs (Spironolactone) .Sherry [name]... Take 1 tablet by mouth daily.  Problem # 3:  SEIZURE DISORDER (ICD-780.39) Stable on current medical regimen.  However, it has been several years since the patient has seen a neurologist for her seizure disorder.  At her next clinic visit we will discuss the need to establish care with a neurologist.  Her updated medication list for this problem includes:    Phenobarbital 97.2 Mg Tabs (Phenobarbital) .Sherry [name]... Take two tablets by mouth at bed time  Problem # 4:  LEG EDEMA, BILATERAL (ICD-782.3) Per patient, this has improved with new medical regimen.  Continue current medications.    Her updated medication list for this problem includes:    Furosemide 40 Mg Tabs (Furosemide) .Sherry [name]... Take 1 tablet by mouth once a day    Aldactone 25 Mg Tabs (Spironolactone) .Sherry [name]... Take 1 tablet by mouth daily.  Problem # 5:  PREVENTIVE HEALTH CARE (ICD-V70.0) Patient is not sure if she has ever had a tetanus shot.  Will administer T-dap today.  Complete Medication List: 1)  Norvasc 10 Mg Tabs (Amlodipine besylate) .... Take 1 tablet by mouth once a day 2)  Lisinopril 40 Mg Tabs (Lisinopril) .... Take two pills by mouth once a day. 3)  Lopressor 50 Mg Tabs (Metoprolol tartrate) .... Take 1/2 tablet by mouth two times a day 4)  Simvastatin 20 Mg Tabs (Simvastatin) .... Take 1 tablet by mouth once a day 5)  Phenobarbital 97.2 Mg Tabs (Phenobarbital) .... Take two tablets by mouth at bed time 6)  Aspirin 81 Mg Tbec (Aspirin) .... Take 1 tablet by mouth once a day 7)  Calcarb 600 1500 Mg  Tabs (Calcium carbonate) .... Take 1 tablet by mouth two times a day 8)  Furosemide 40 Mg Tabs (Furosemide) .... Take 1 tablet by mouth once a day 9)  Aldactone 25 Mg Tabs (Spironolactone) .... Take 1 tablet by mouth daily.  Other Orders: Tdap => 76yrs IM [id number]) Admin 1st Vaccine [id number])  Patient Instructions: 1)  Please follow-up with me in 4-6 weeks. 2)  Please continue to take your medications as prescribed.  3)  Please continue to work on your diet and limit low-salt foods. 4)  Please continue to work with physical therapy and follow the recommendations of occupational therapy.  Prescriptions: ALDACTONE 25 MG TABS (SPIRONOLACTONE) Take 1 tablet by mouth daily.  #30 x 5   Entered and Authorized by:   Rikki Spearing, [name]   Signed by:   Rikki Spearing, [name] on 09/18/2009   Method used:   Electronically to        [facility]* (retail)       [place], [place]  [address]       Ph: [phone]       Fax: [phone]   RxID:   [id number] CALCARB 600 1500 MG TABS (CALCIUM CARBONATE) Take 1 tablet by mouth two times a day  #60 x 5   Entered and Authorized by:   Rikki Spearing, [name]   Signed by:   Rikki Spearing, [name] on 09/18/2009   Method used:   Electronically to        [facility]* (retail)       [place], [place]  [address]       Ph: [phone]       Fax: [phone]   [id number] LOPRESSOR 50 MG TABS (METOPROLOL TARTRATE) Take 1/2 tablet by mouth two times a day  #30 x 5   Entered and Authorized by:   Rikki Spearing, [name]   Signed by:   Rikki Spearing, [name] on 09/18/2009   Method used:   Electronically to        [facility]* (retail)       [place], [place]  [address]       Ph: [phone]       Fax: [phone]   [id number] NORVASC 10 MG TABS (AMLODIPINE BESYLATE) Take 1 tablet by mouth  once a day  #30 x 5   Entered and Authorized by:   Rikki Spearing, [name]   Signed by:   Rikki Spearing, [name] on 09/18/2009   Method used:   Electronically to        [facility]* (retail)       [address]       [place], [place]  [address]       Ph: [phone]       Fax: [phone]   [id number]    Prevention & Chronic Care Immunizations   Influenza vaccine: Fluvax Non-MCR  (12/17/2007)   Influenza vaccine deferral:  Not available  (09/18/2009)    Tetanus booster: 09/18/2009: Tdap    Pneumococcal vaccine: Not documented    H. zoster vaccine: Not documented  Colorectal Screening   Hemoccult: Not documented   Hemoccult action/deferral: Deferred  (06/01/2009)    Colonoscopy: Not documented   Colonoscopy action/deferral: Deferred  (09/18/2009)  Other Screening   Pap smear: Not documented   Pap smear action/deferral: Not indicated S/P hysterectomy  (06/01/2009)    Mammogram: ASSESSMENT: Negative - BI-RADS 1^MM DIGITAL SCREENING  (06/08/2009)   Mammogram action/deferral: Ordered  (06/01/2009)    DXA bone density scan: Not documented   DXA bone density action/deferral: Not indicated  (09/18/2009)  Reports requested:   Last colonoscopy report requested.  Smoking status: quit  (09/18/2009)  Lipids   Total Cholesterol: 185  (06/08/2009)   LDL: 104  (06/08/2009)   LDL Direct: Not documented   HDL: 66  (06/08/2009)   Triglycerides: 73  (06/08/2009)    SGOT (AST): 23  (06/01/2009)   SGPT (ALT): 19  (06/01/2009)   Alkaline phosphatase: 73  (06/01/2009)   Total bilirubin: 0.3  (06/01/2009)    Lipid flowsheet reviewed?: Yes   Progress toward LDL goal: Improved  Hypertension   Last Blood Pressure: 138 / 74  (09/18/2009)   Serum creatinine: 1.33  (06/08/2009)   Serum potassium 4.1  (06/08/2009)    Hypertension flowsheet reviewed?: Yes   Progress toward BP goal: Improved  Self-Management Support :   Personal Goals (by the  next clinic visit) :      Personal blood pressure goal: 140/90  (09/18/2009)     Personal LDL goal: 100  (09/18/2009)    Patient will work on the following items until the next clinic visit to reach self-care goals:     Medications and monitoring: take my medicines every day, bring all of my medications to every visit  (09/18/2009)     Eating: drink diet soda or water instead of juice or soda, eat more vegetables, use fresh or frozen vegetables, eat foods that are low in salt, eat baked foods instead of fried foods, eat fruit for snacks and desserts, limit or avoid alcohol  (09/18/2009)     Activity: take a 30 minute walk every day  (06/01/2009)    Hypertension self-management support: Education handout, Resources for patients handout  (07/31/2009)    Lipid self-management support: Education handout, Resources for patients handout  (07/31/2009)    Nursing Instructions: Give tetanus booster today Request report of last colonoscopy      Tetanus/Td Vaccine    Vaccine Type: Tdap    Site: left deltoid    Mfr: GlaxoSmithKline    Dose: 0.5 ml    Route: IM    Given by: Morrison Old RN    Exp. Date: 08/18/2011    Lot #: [id number]    VIS given: 01/06/07 version given September 18, 2009.

## 2010-03-20 NOTE — Progress Notes (Signed)
Summary: refill/gg  Phone Note Refill Request  on May 30, 2009 11:37 AM  Refills Requested: Medication #1:  LOPRESSOR 50 MG TABS Take 1/2 tablet by mouth two times a day  Method Requested: Electronic Initial call taken by: Gevena Cotton RN,  May 30, 2009 11:37 AM    Patient will need an appointment for regular follow up.Prescriptions: LOPRESSOR 50 MG TABS (METOPROLOL TARTRATE) Take 1/2 tablet by mouth two times a day  #30 x 3   Entered and Authorized by:   Niel Hummer MD   Signed by:   Niel Hummer MD on 05/31/2009   Method used:   [facility]ically to        [facility] [address] [phone]* (retail)       [address]       [place], [place]  [address]       Ph: [phone] or [phone]       Fax: [phone]   RxID:   [id number]

## 2010-03-20 NOTE — Miscellaneous (Signed)
Summary: [facility]   Imported By: Enedina Finner 09/05/2009 14:58:33  _____________________________________________________________________  External Attachment:    Type:   Image     Comment:   External Document

## 2010-03-20 NOTE — Assessment & Plan Note (Signed)
Summary: ACUTE-HAVING DIFFICULTY WALKING([facility])/CFB   Vital Signs:  Patient profile:   65 year old female Height:      61 inches Weight:      242.1 pounds BMI:     45.91 Temp:     97.5 degrees F oral Pulse rate:   70 / minute BP sitting:   173 / 93  (right arm)  Vitals Entered By: Silverio Decamp NT II (July 31, 2009 10:13 AM) CC: TROUBLE WITH LEHS NO STRENGTH Is Patient Diabetic? No Pain Assessment Patient in pain? yes     Location: ANKLES HIPS,AND LEGS Intensity: 8 Type: aching Onset of pain  Chronic  Does patient need assistance? Functional Status Self care Ambulation Normal   Primary Care Provider:  Niel Hummer MD  CC:  TROUBLE WITH LEHS NO STRENGTH.  History of Present Illness: Patient is a 65 yr old woman with a PMH of seizure disorder, HTN, HLD and chronic lower back pain that presents for acute visit with complaint of trouble walking for the past month. She reports that Dr. Tyrell Antonio, her PCP, recommended walking 30 minutes a day at last appointment. She started to do that and then became weaker and weaker and eventually could not walk. This occurred gradually, over the past month, and the patient states this is because she has gotten weaker in her lower extremities. Patient states that her knees hurt constantly, especially at night and that her ankles feel like that "are ready to break in two sometimes", but these pains are not the reason why she cannot ambulate. Prior to a month ago, patient was walking with assistance of a walker (she has been doing this for the past 5 years) as she had some falls in the past. She denies any urinary retention or incontinence with urine or stool, changes in sensation, tingling, or foot drop. She denies chest pains, shortness of breath or fever. She reports good upper extremity strength, which she has relied upon for mobility around the house with a cart-like device borrowed from her neighbor after that neighbor's husband passed away.  Her daughter with her requests a "Hover Round" for the patient and remarked to nursing that she cannot understand why her mother can no longer walk as everything appears normal, but "it's like her head just isn't telling her legs what to do".  Also, the patient reports that she has been taking all of her blood pressure medications, including aldactone which was recently added to her medication list.  Preventive Screening-Counseling & Management  Alcohol-Tobacco     Smoking Status: quit     Packs/Day: 0.2     Year Started: LATE 30'S- 1pk/mth     Year Quit: 2009  Caffeine-Diet-Exercise     Does Patient Exercise: no  Current Medications (verified): 1)  Norvasc 10 Mg Tabs (Amlodipine Besylate) .... Take 1 Tablet By Mouth Once A Day 2)  Lisinopril 40 Mg Tabs (Lisinopril) .... Take Two Pills By Mouth Once A Day. 3)  Lopressor 50 Mg Tabs (Metoprolol Tartrate) .... Take 1/2 Tablet By Mouth Two Times A Day 4)  Simvastatin 20 Mg Tabs (Simvastatin) .... Take 1 Tablet By Mouth Once A Day 5)  Phenobarbital 97.2 Mg Tabs (Phenobarbital) .... Take Two Tablets By Mouth At Bed Time 6)  Aspirin 81 Mg Tbec (Aspirin) .... Take 1 Tablet By Mouth Once A Day 7)  Calcarb 600 1500 Mg Tabs (Calcium Carbonate) .... Take 1 Tablet By Mouth Two Times A Day 8)  Furosemide 40 Mg  Tabs (  Furosemide) .... Take 1 Tablet By Mouth Once A Day 9)  Aldactone 25 Mg Tabs (Spironolactone) .... Take 1 Tablet By Mouth Daily.  Allergies (verified): No Known Drug Allergies  Review of Systems      See HPI  Physical Exam  General:  alert, well-developed, well-nourished, and well-hydrated.   Head:  normocephalic and atraumatic.   Eyes:  vision grossly intact.   Ears:  no external deformities.   Nose:  no external deformity and no nasal discharge.   Mouth:  pharynx pink and moist.   Lungs:  normal respiratory effort, normal breath sounds, no crackles, and no wheezes.   Heart:  normal rate and regular rhythm.  II/VI systolic  murmur noted over right upper sternal border. Abdomen:  soft and non-tender.   Msk:  normal ROM, no joint tenderness, no joint swelling, no joint warmth, no redness over joints, no joint deformities, no joint instability, and no muscle atrophy.  When attempting to assess gait, patient needs assitance on both sides to help her to a semi-standing position. She maintained a good amount of flexion in the knees stating that she could not straighten them further, although significant assitance was provided to hold patient up for her to extend knees. Of note, patient had very mild if any decreased ROM in knees with active or passive flexion on exam while sitting in chair. Extremities:  1+ left pedal edema and left pretibial edema, 1+ right pedal edema and right pretibial edema.   Neurologic:  alert & oriented X3, cranial nerves II-XII intact, strength appears 4+ to 5 of 5 in all extremities when encouraged and resistance increased, sensation intact to light touch, DTRs (patellar, achilles, biceps & brachioradialas)  seem symmetrical and normal, toes down bilaterally on Babinski, and patient unable to stand up straight enough to test gait. See MSK exam for details. Skin:  turgor normal and color normal.   Psych:  Oriented X3, memory intact for recent and remote, good eye contact, not anxious appearing, and not agitated.     Impression & Recommendations:  Problem # 1:  ABNORMALITY OF GAIT (ICD-781.2) More extensive history obtained by Dr. Nance Pew suggests that these symptoms have been present for longer than one month, with episodes of weakness in the past occuring as well, but not to this severity. Patient's exam doesn't indicate severe neurologic or motor deficits, but further testing may be appropriate after evaluation by neuro and PT/OT, ie nerve conduction studies, muscle biopsy, etc. Patient initially seems weak in bilateral lower extremities but with encouragement and increased resistance, the patient  appears to have good lower extremity strength. Pedal pulses are strong, doubt vascular compromise. Given decrease in functional status, will refer to PT/OT. Will check inflammatory markers given continued complaint of knee and ankle pain with ambulation as well as a CK for c/o muscle weakness. If further workup and evaluation does not clarify the diagnosis, would question conversion reaction, although patient denies any recent stressors or changes in mood or depression as a potential inciting cause.  Problem # 2:  HYPERTENSION (ICD-401.9) Patient with BP similar to level at last visit. Did not have ample opporutnity to discuss any changes to BP medications given time spent with leg pain and weakness. Patient had lab work and left immediately thereafter. Regardless, patient still affirms that she is taking her medications as well as the newly added aldactone and Dr. Tyrell Antonio verified filled prescriptions at her pharmacy after her last appointment. At followup appointment, if blood pressure continues to  be elevated, I would add either hydralazine or clonidine to get control of patient's blood pressures. I would prefer to try hydralazine or increase B-blocker (HR only at 70 today, though) prior to centrally acting agent such as clonidine given hx of seizures.  Her updated medication list for this problem includes:    Norvasc 10 Mg Tabs (Amlodipine besylate) .[name]... Take 1 tablet by mouth once a day    Lisinopril 40 Mg Tabs (Lisinopril) .[name]... Take two pills by mouth once a day.    Lopressor 50 Mg Tabs (Metoprolol tartrate) .[name]... Take 1/2 tablet by mouth two times a day    Furosemide 40 Mg Tabs (Furosemide) .[name]... Take 1 tablet by mouth once a day    Aldactone 25 Mg Tabs (Spironolactone) .[name]... Take 1 tablet by mouth daily.  Problem # 3:  LEG EDEMA, BILATERAL (ICD-782.3) Has some mild edema, 1-2 +, but is stable. Patient has not noted any change with addition of aldactone.  Her updated medication list for this  problem includes:    Furosemide 40 Mg Tabs (Furosemide) .[name]... Take 1 tablet by mouth once a day    Aldactone 25 Mg Tabs (Spironolactone) .[name]... Take 1 tablet by mouth daily.  Problem # 4:  SEIZURE DISORDER (ICD-780.39) Will schedule follow up with neurology as she has not been seen in years and now has primary complaint of bilateral lower extremity weakness. No complaints of any seizures recently.   Her updated medication list for this problem includes:    Phenobarbital 97.2 Mg Tabs (Phenobarbital) .[name]... Take two tablets by mouth at bed time  Complete Medication List: 1)  Norvasc 10 Mg Tabs (Amlodipine besylate) .... Take 1 tablet by mouth once a day 2)  Lisinopril 40 Mg Tabs (Lisinopril) .... Take two pills by mouth once a day. 3)  Lopressor 50 Mg Tabs (Metoprolol tartrate) .... Take 1/2 tablet by mouth two times a day 4)  Simvastatin 20 Mg Tabs (Simvastatin) .... Take 1 tablet by mouth once a day 5)  Phenobarbital 97.2 Mg Tabs (Phenobarbital) .... Take two tablets by mouth at bed time 6)  Aspirin 81 Mg Tbec (Aspirin) .... Take 1 tablet by mouth once a day 7)  Calcarb 600 1500 Mg Tabs (Calcium carbonate) .... Take 1 tablet by mouth two times a day 8)  Furosemide 40 Mg Tabs (Furosemide) .... Take 1 tablet by mouth once a day 9)  Aldactone 25 Mg Tabs (Spironolactone) .... Take 1 tablet by mouth daily.  Other Orders: T-Sed Rate (Automated) [phone]) T-Antinuclear Antib (ANA) [id number]) T-CK Total [phone])  Patient Instructions: 1)  Please schedule a follow-up appointment in 2 months. 2)  We will call you with appointments for PT/OT and neurology follow up. 3)  Please come back to clinic sooner if symptoms worsen or if any additional symptoms arise.  Prevention & Chronic Care Immunizations   Influenza vaccine: Fluvax Non-MCR  (12/17/2007)    Tetanus booster: Not documented    Pneumococcal vaccine: Not documented    H. zoster vaccine: Not documented  Colorectal  Screening   Hemoccult: Not documented   Hemoccult action/deferral: Deferred  (06/01/2009)    Colonoscopy: Not documented  Other Screening   Pap smear: Not documented   Pap smear action/deferral: Not indicated S/P hysterectomy  (06/01/2009)    Mammogram: ASSESSMENT: Negative - BI-RADS 1^MM DIGITAL SCREENING  (06/08/2009)   Mammogram action/deferral: Ordered  (06/01/2009)    DXA bone density scan: Not documented   Smoking status: quit  (07/31/2009)  Lipids  Total Cholesterol: 185  (06/08/2009)   LDL: 104  (06/08/2009)   LDL Direct: Not documented   HDL: 66  (06/08/2009)   Triglycerides: 73  (06/08/2009)    SGOT (AST): 23  (06/01/2009)   SGPT (ALT): 19  (06/01/2009)   Alkaline phosphatase: 73  (06/01/2009)   Total bilirubin: 0.3  (06/01/2009)  Hypertension   Last Blood Pressure: 173 / 93  (07/31/2009)   Serum creatinine: 1.33  (06/08/2009)   Serum potassium 4.1  (06/08/2009)  Self-Management Support :    Patient will work on the following items until the next clinic visit to reach self-care goals:     Medications and monitoring: take my medicines every day, bring all of my medications to every visit  (07/31/2009)     Eating: drink diet soda or water instead of juice or soda, eat more vegetables, use fresh or frozen vegetables, eat foods that are low in salt, eat baked foods instead of fried foods, eat fruit for snacks and desserts  (07/31/2009)     Activity: take a 30 minute walk every day  (06/01/2009)    Hypertension self-management support: Education handout, Resources for patients handout  (07/31/2009)   Hypertension education handout printed    Lipid self-management support: Education handout, Resources for patients handout  (07/31/2009)     Lipid education handout printed      Resource handout printed.   Process Orders Check Orders Results:     [facility]: Order checked:     Luvenia Starch MD NOT AUTHORIZED TO ORDER Tests Sent for  requisitioning (August 01, 2009 4:56 PM):     07/31/2009: [facility] -- T-Sed Rate (Automated) [id number] (signed)     07/31/2009: [facility] -- T-Antinuclear Antib (ANA) [id number] (signed)     07/31/2009: [facility] -- T-CK Total [[id number]] (signed)

## 2010-03-20 NOTE — Miscellaneous (Signed)
Summary: NEROREHABILITATION OCCUPATIONAL THERAPY EVAL  NEROREHABILITATION OCCUPATIONAL THERAPY EVAL   Imported By: Enedina Finner 09/11/2009 15:18:11  _____________________________________________________________________  External Attachment:    Type:   Image     Comment:   External Document

## 2010-03-20 NOTE — Miscellaneous (Signed)
Summary: RENEWAL SUMMARY FOR PT SERVICES  RENEWAL SUMMARY FOR PT SERVICES   Imported By: Enedina Finner 11/13/2009 16:18:00  _____________________________________________________________________  External Attachment:    Type:   Image     Comment:   External Document

## 2010-03-20 NOTE — Assessment & Plan Note (Signed)
Summary: est-ck/fu/meds/cfb   Vital Signs:  Patient profile:   65 year old female Height:      61 inches (154.94 cm) Weight:      247.01 pounds (112.28 kg) BMI:     46.84 Temp:     97.6 degrees F (36.44 degrees C) oral Pulse rate:   69 / minute BP sitting:   170 / 71  (left arm)  Vitals Entered By: Sander Nephew RN (June 01, 2009 1:55 PM)  Primary Care Provider:  Niel Hummer MD   History of Present Illness: 65 year old with Past Medical History: HTN HYPERLIPIDEMIA CHRONIC BACK PAIN, secondary to trauma H/o seizure disorder: First began in 1998                                   EEG(6/01):Diffuse and Rt posterior temporal slowing                                   Seen by Dr. Erling Cruz, neurologist in 2003                                   ?Complex partial                                   Idiopathic H/o Cardiac cath in 1992 Chronic LE edema    - Dopplers negative in 07/2007 Who presents for regular follow up and lab work. she is doing well. She doesnt have any complaints. She has been taking her BP medications. She denies adversed effect from meds. She is due for her Mammogram.    Depression History:      The patient denies a depressed mood most of the day and a diminished interest in her usual daily activities.         Preventive Screening-Counseling & Management  Alcohol-Tobacco     Smoking Status: quit     Packs/Day: 0.2     Year Started: LATE 30'S- 1pk/mth     Year Quit: 2009  Current Medications (verified): 1)  Norvasc 10 Mg Tabs (Amlodipine Besylate) .... Take 1 Tablet By Mouth Once A Day 2)  Lisinopril 40 Mg Tabs (Lisinopril) .... Take Two Pills By Mouth Once A Day. 3)  Lopressor 50 Mg Tabs (Metoprolol Tartrate) .... Take 1/2 Tablet By Mouth Two Times A Day 4)  Simvastatin 20 Mg Tabs (Simvastatin) .... Take 1 Tablet By Mouth Once A Day 5)  Phenobarbital 97.2 Mg Tabs (Phenobarbital) .... Take Two Tablets By Mouth At Bed Time 6)  Aspirin 81 Mg Tbec (Aspirin) ....  Take 1 Tablet By Mouth Once A Day 7)  Calcarb 600 1500 Mg Tabs (Calcium Carbonate) .... Take 1 Tablet By Mouth Two Times A Day 8)  Furosemide 40 Mg  Tabs (Furosemide) .... Take 1 Tablet By Mouth Once A Day  Allergies: No Known Drug Allergies  Social History: Smoking Status:  quit  Review of Systems  The patient denies fever, chest pain, syncope, prolonged cough, headaches, and hemoptysis.    Physical Exam  General:  alert, well-developed, and well-nourished.   Head:  normocephalic, atraumatic, and no abnormalities observed.   Lungs:  normal respiratory effort, no intercostal retractions, no accessory muscle  use, and normal breath sounds.   Heart:  normal rate, regular rhythm, no murmur, and no gallop.   Abdomen:  soft, non-tender, normal bowel sounds, no distention, and no masses.   Extremities:  plus 2 edema.  Neurologic:  alert & oriented X3 and cranial nerves II-XII intact.     Impression & Recommendations:  Problem # 1:  SEIZURE DISORDER (ICD-780.39) Patient denies any recent seizure episodes. I will check phenobarbital level, and cbc to  check for agranulocytosis. I will continue wth same dose for now.  Her updated medication list for this problem includes:    Phenobarbital 97.2 Mg Tabs (Phenobarbital) .[name]... Take two tablets by mouth at bed time  Orders: T-Phenobarbital [id number])  Problem # 2:  HYPERTENSION (ICD-401.9) Blood pressure very high. She relates thta she has been taking her medications. I got report from pharmacy and she has been getting medications  refill for last 2 months. I will start aldactone. i will check aldosterone, renine level. I will check bmet in 1 week to check for hyperkalemia.  Her updated medication list for this problem includes:    Norvasc 10 Mg Tabs (Amlodipine besylate) .[name]... Take 1 tablet by mouth once a day    Lisinopril 40 Mg Tabs (Lisinopril) .[name]... Take two pills by mouth once a day.    Lopressor 50 Mg Tabs (Metoprolol tartrate)  .[name]... Take 1/2 tablet by mouth two times a day    Furosemide 40 Mg Tabs (Furosemide) .[name]... Take 1 tablet by mouth once a day    Aldactone 25 Mg Tabs (Spironolactone) .[name]... Take 1 tablet by mouth daily.  Orders: T-Comprehensive Metabolic Panel ([id number]) T-CBC w/Diff [id number]) T- * Misc. Laboratory test ([id number]) T-Basic Metabolic Panel ([id number])  BP today: 170/71 Prior BP: 175/80 (06/28/2008)  Labs Reviewed: K+: 4.3 (06/28/2008) Creat: : 1.14 (06/28/2008)   Chol: 244 (06/28/2008)   HDL: 71 (06/28/2008)   LDL: 147 (06/28/2008)   TG: 130 (06/28/2008)  Problem # 3:  HYPERLIPIDEMIA (ICD-272.4)  I will check fasting lipid profile.  Her updated medication list for this problem includes:    Simvastatin 20 Mg Tabs (Simvastatin) .[name]... Take 1 tablet by mouth once a day  Orders: T-Lipid Profile [id number])  Problem # 4:  Screening Breast Cancer (ICD-V76.10) I will check Mammogram.   Complete Medication List: 1)  Norvasc 10 Mg Tabs (Amlodipine besylate) .... Take 1 tablet by mouth once a day 2)  Lisinopril 40 Mg Tabs (Lisinopril) .... Take two pills by mouth once a day. 3)  Lopressor 50 Mg Tabs (Metoprolol tartrate) .... Take 1/2 tablet by mouth two times a day 4)  Simvastatin 20 Mg Tabs (Simvastatin) .... Take 1 tablet by mouth once a day 5)  Phenobarbital 97.2 Mg Tabs (Phenobarbital) .... Take two tablets by mouth at bed time 6)  Aspirin 81 Mg Tbec (Aspirin) .... Take 1 tablet by mouth once a day 7)  Calcarb 600 1500 Mg Tabs (Calcium carbonate) .... Take 1 tablet by mouth two times a day 8)  Furosemide 40 Mg Tabs (Furosemide) .... Take 1 tablet by mouth once a day 9)  Aldactone 25 Mg Tabs (Spironolactone) .... Take 1 tablet by mouth daily.  Other Orders: Mammogram (Screening) (Mammo)  Patient Instructions: 1)  Come fasting for lab work in 1 week after you start taking new medication for blood pressure. 2)  You have a new medication for your blood pressure, aldactone. 3)   Please schedule an appointment in 1 month. 4)  bring your medication bottle  next visit. Is Patient Diabetic? No Pain Assessment Patient in pain? yes     Location: legs, neck Intensity: 7 Type: aching Onset of pain  Constant Nutritional Status BMI of > 30 = obese  Have you ever been in a relationship where you felt threatened, hurt or afraid?No   Does patient need assistance? Functional Status Self care Ambulation Impaired:Risk for fall Comments Uses a walker.  Check up.  Prescriptions: ALDACTONE 25 MG TABS (SPIRONOLACTONE) Take 1 tablet by mouth daily.  #30 x 4   Entered and Authorized by:   Niel Hummer MD   Signed by:   Niel Hummer MD on 06/01/2009   Method used:   Print then Give to Patient   RxID:   [id number] ALDACTONE 25 MG TABS (SPIRONOLACTONE) Take 1 tablet by mouth daily.  #30 x 4   Entered and Authorized by:   Niel Hummer MD   Signed by:   Niel Hummer MD on 06/01/2009   Method used:   Electronically to        [facility] [address] [phone]* (retail)       [address]       [place], [place]  [address]       Ph: [phone] or [phone]       Fax: [phone]   RxID:   [id number]   Prevention & Chronic Care Immunizations   Influenza vaccine: Fluvax Non-MCR  (12/17/2007)    Tetanus booster: Not documented    Pneumococcal vaccine: Not documented    H. zoster vaccine: Not documented  Colorectal Screening   Hemoccult: Not documented   Hemoccult action/deferral: Deferred  (06/01/2009)    Colonoscopy: Not documented  Other Screening   Pap smear: Not documented   Pap smear action/deferral: Not indicated S/P hysterectomy  (06/01/2009)    Mammogram: Not documented   Mammogram action/deferral: Ordered  (06/01/2009)    DXA bone density scan: Not documented   Smoking status: quit  (06/01/2009)  Lipids   Total Cholesterol: 244  (06/28/2008)   LDL: 147  (06/28/2008)   LDL Direct: Not documented   HDL:  71  (06/28/2008)   Triglycerides: 130  (06/28/2008)    SGOT (AST): 25  (06/28/2008)   SGPT (ALT): 20  (06/28/2008) CMP ordered    Alkaline phosphatase: 86  (06/28/2008)   Total bilirubin: 0.3  (06/28/2008)    Lipid flowsheet reviewed?: Yes   Progress toward LDL goal: Unchanged  Hypertension   Last Blood Pressure: 170 / 71  (06/01/2009)   Serum creatinine: 1.14  (06/28/2008)   Serum potassium 4.3  (06/28/2008) CMP ordered     Hypertension flowsheet reviewed?: Yes   Progress toward BP goal: Unchanged  Self-Management Support :    Patient will work on the following items until the next clinic visit to reach self-care goals:     Medications and monitoring: take my medicines every day, bring all of my medications to every visit  (06/01/2009)     Eating: drink diet soda or water instead of juice or soda, eat more vegetables, eat foods that are low in salt, eat baked foods instead of fried foods, eat fruit for snacks and desserts, limit or avoid alcohol  (06/01/2009)     Activity: take a 30 minute walk every day  (06/01/2009)    Hypertension self-management support: Education handout, Engineer, technical sales, Resources for patients handout, Written self-care plan  (06/01/2009)   Hypertension self-care plan printed.   Hypertension  education handout printed    Lipid self-management support: Education handout, Pre-printed educational material, Resources for patients handout, Written self-care plan  (06/01/2009)   Lipid self-care plan printed.   Lipid education handout printed      Resource handout printed.   Nursing Instructions: Schedule screening mammogram (see order)   Process Orders Check Orders Results:     [facility]: ABN not required for this insurance Tests Sent for requisitioning (June 02, 2009 11:08 AM):     06/01/2009: [facility] -- T-Comprehensive Metabolic Panel [id number] (signed)     06/01/2009: [facility] -- T-CBC w/Diff [id number] (signed)     06/01/2009: [facility] -- T-Phenobarbital [id number] (signed)     06/01/2009: [place]. Laboratory test [id number] (signed)     06/01/2009: [facility] -- T-Basic Metabolic Panel [id number] (signed)     06/01/2009: [facility] -- T-Lipid Profile [phone] (signed)     Vital Signs:  Patient profile:   65 year old female Height:      61 inches (154.94 cm) Weight:      247.01 pounds (112.28 kg) BMI:     46.84 Temp:     97.6 degrees F (36.44 degrees C) oral Pulse rate:   69 / minute BP sitting:   170 / 71  (left arm)  Vitals Entered By: Sander Nephew RN (June 01, 2009 1:55 PM)  Process Orders Check Orders Results:     [facility]: [id number] not required for this insurance Tests Sent for requisitioning (June 02, 2009 11:08 AM):     06/01/2009: [facility] -- T-Comprehensive Metabolic Panel [id number] (signed)     06/01/2009: [facility] -- T-CBC w/Diff [id number] (signed)     06/01/2009: [facility] -- T-Phenobarbital [id number] (signed)     06/01/2009: [place]. Laboratory test [id number] (signed)     06/01/2009: [facility] -- T-Basic Metabolic Panel [id number] (signed)     06/01/2009: [facility] -- T-Lipid Profile [phone] (signed)

## 2010-03-20 NOTE — Progress Notes (Signed)
Summary: refill/gg  Phone Note Refill Request  on May 29, 2009 4:10 PM  Refills Requested: Medication #1:  CALCARB 600 1500 MG TABS Take 1 tablet by mouth two times a day   Last Refilled: 05/01/2009  Method Requested: Electronic Initial call taken by: Gevena Cotton RN,  May 29, 2009 4:10 PM    Prescriptions: CALCARB 600 1500 MG TABS (CALCIUM CARBONATE) Take 1 tablet by mouth two times a day  #60 x 3   Entered and Authorized by:   Niel Hummer MD   Signed by:   Niel Hummer MD on 05/29/2009   Method used:   [facility]        [facility] [address] [phone]* (retail)       [address]       [place], [place]  [address]       Ph: [phone] or [phone]       Fax: [phone]   [id number]  Patient will need appointment to follow up phenobarbital level. Thanks.   Appended Document: refill/gg flag sent to [facility] to schedule appointment

## 2010-03-20 NOTE — Miscellaneous (Signed)
Summary: REHABILITATION DISCHARGE SUMMARY  REHABILITATION DISCHARGE SUMMARY   Imported By: Enedina Finner 12/01/2009 15:27:19  _____________________________________________________________________  External Attachment:    Type:   Image     Comment:   External Document

## 2010-03-20 NOTE — Progress Notes (Signed)
Summary: med refill/gp  Phone Note Refill Request Message from:  Fax from Pharmacy on Jul 13, 2009 10:47 AM  Refills Requested: Medication #1:  LISINOPRIL 40 MG TABS Take two pills by mouth once a day.   Last Refilled: 06/15/2009  Medication #2:  SIMVASTATIN 20 MG TABS Take 1 tablet by mouth once a day   Last Refilled: 06/06/2009 Last appt. 06/01/09 with labs.   Method Requested: Electronic Initial call taken by: Morrison Old RN,  Jul 13, 2009 10:47 AM    Prescriptions: SIMVASTATIN 20 MG TABS (SIMVASTATIN) Take 1 tablet by mouth once a day  #30 x 11   Entered and Authorized by:   Milta Deiters MD   Signed by:   Milta Deiters MD on 07/13/2009   Method used:   [facility]ronically to        [facility] [address] [phone]* (retail)       [address]       [place], [place]  [address]       Ph: [phone] or [phone]       Fax: [phone]   RxID:   [id number] LISINOPRIL 40 MG TABS (LISINOPRIL) Take two pills by mouth once a day.  #80 x 6   Entered and Authorized by:   Milta Deiters MD   Signed by:   Milta Deiters MD on 07/13/2009   Method used:   Electronically to        [facility] [address] [phone]* (retail)       [address]       [place], [place]  [address]       Ph: [phone] or [phone]       Fax: [phone]   RxID:   [id number]

## 2010-03-20 NOTE — Progress Notes (Signed)
Summary: Refill  Phone Note Refill Request Message from:  Fax from Pharmacy on September 25, 2009 9:59 AM  Refills Requested: Medication #1:  FUROSEMIDE 40 MG  TABS Take 1 tablet by mouth once a day Last labs 09/18/2009 .  Last labs 07/31/2009.   Method Requested: Electronic Initial call taken by: Sander Nephew RN,  September 25, 2009 9:59 AM    Prescriptions: FUROSEMIDE 40 MG  TABS (FUROSEMIDE) Take 1 tablet by mouth once a day  #30 x 5   Entered and Authorized by:   Oval Linsey MD   Signed by:   Oval Linsey MD on 09/25/2009   Method used:   Electronically to        [id number]* (retail)       [address] [place], [place]  [address]       Ph: [phone]       Fax: [phone]   [id number]

## 2010-03-20 NOTE — Progress Notes (Signed)
Summary: med refill/gp  Phone Note Refill Request Message from:  Fax from Pharmacy on July 28, 2009 1:53 PM  Refills Requested: Medication #1:  FUROSEMIDE 40 MG  TABS Take 1 tablet by mouth once a day   Last Refilled: 06/27/2009 Next appt June 13.   Had labs drawn Apr. 21.   Method Requested: Electronic Initial call taken by: Morrison Old RN,  July 28, 2009 1:53 PM  Follow-up for Phone Call        Refill approved-nurse to complete Follow-up by: Rhea Pink  DO,  July 28, 2009 1:57 PM    Prescriptions: FUROSEMIDE 40 MG  TABS (FUROSEMIDE) Take 1 tablet by mouth once a day  #30 x 0   Entered and Authorized by:   Rhea Pink  DO   Signed by:   Rhea Pink  DO on 07/28/2009   Method used:   [facility]ronically to        [facility] [address] [phone]* (retail)       [address]       [place], [place]  [address]       Ph: [phone] or [phone]       Fax: [phone]   RxID:   [id number]

## 2010-03-20 NOTE — Progress Notes (Signed)
Summary: refill/ hla  Phone Note Refill Request Message from:  Fax from Pharmacy on August 24, 2009 2:37 PM  Refills Requested: Medication #1:  FUROSEMIDE 40 MG  TABS Take 1 tablet by mouth once a day   Dosage confirmed as above?Dosage Confirmed   Last Refilled: 6/10 last visit and labs 6/13  Initial call taken by: Freddy Finner RN,  August 24, 2009 2:37 PM  Follow-up for Phone Call        Refill approved-nurse to complete Follow-up by: Rhea Pink  DO,  August 25, 2009 5:06 PM    Prescriptions: FUROSEMIDE 40 MG  TABS (FUROSEMIDE) Take 1 tablet by mouth once a day  #30 x 0   Entered by:   Rhea Pink  DO   Authorized by:   Alphia Moh MD   Signed by:   Rhea Pink  DO on 08/25/2009   Method used:   Electronically to        [facility] [address] [phone]* (retail)       [address]       [place], [place]  [address]       Ph: [phone] or [phone]       Fax: [phone]   RxID:   [id number]

## 2010-03-20 NOTE — Miscellaneous (Signed)
[facility]   Imported By: Enedina Finner 09/04/2009 09:05:18  _____________________________________________________________________  External Attachment:    Type:   Image     Comment:   External Document

## 2010-03-20 NOTE — Miscellaneous (Signed)
Summary: INITIAL SUMMARY [facility]  INITIAL SUMMARY [facility]   Imported By: Lacy Duverney 10/31/2009 11:37:38  _____________________________________________________________________  External Attachment:    Type:   Image     Comment:   External Document

## 2010-03-20 NOTE — Assessment & Plan Note (Signed)
Vital Signs:  Patient profile:   65 year old female Height:      61 inches (154.94 cm) Weight:      238.8 pounds (108.55 kg) BMI:     45.28 Temp:     97.7 degrees F (36.50 degrees C) oral Pulse rate:   65 / minute BP sitting:   183 / 83  (left arm)  Vitals Entered By: Hilda Blades Ditzler RN (December 19, 2009 2:50 PM)  Serial Vital Signs/Assessments:  Time      Position  BP       Pulse  Resp  Temp     By 4:10PM              167/77   61                    Debra Ditzler RN  Comments: 4:10PM right arm - Dr Leonia Reeves aware. By: Hilda Blades Ditzler RN   CC: follow-up visit Is Patient Diabetic? No Pain Assessment Patient in pain? no      Nutritional Status BMI of > 30 = obese Nutritional Status Detail appetite good  Have you ever been in a relationship where you felt threatened, hurt or afraid?denies   Does patient need assistance? Functional Status Self care Ambulation Wheelchair Comments Daughter with pt. Ck-up.   Primary Care Provider:  Rikki Spearing, MD  CC:  follow-up visit.  History of Present Illness: 65 y/o woman with PMH significant for b/l leg pain and difficulty walking presented today for a follow up visit. She denies any new complaints at this visit. She was discharged from outpatient physical rehab in septemeber, 2011. As per the patient , PT helped her a lot and she  continues to do exercise at home.  She is able to get out of bed, walk with the assistance i e holding things, able to use bathroom on her own , bath herself. But to make herself more independent in her daily chores like cleaning or cooking , she expressed her concern if we could help her getting the power wheel chair.  Depression History:      The patient denies a depressed mood most of the day and a diminished interest in her usual daily activities.  The patient denies significant weight loss, significant weight gain, insomnia, hypersomnia, psychomotor agitation, psychomotor retardation, fatigue (loss  of energy), feelings of worthlessness (guilt), impaired concentration (indecisiveness), and recurrent thoughts of death or suicide.         Preventive Screening-Counseling & Management  Alcohol-Tobacco     Smoking Status: quit     Packs/Day: 0.2     Year Started: LATE 30'S- 1pk/mth     Year Quit: 2005  Caffeine-Diet-Exercise     Does Patient Exercise: no  Current Medications (verified): 1)  Norvasc 10 Mg Tabs (Amlodipine Besylate) .... Take 1 Tablet By Mouth Once A Day 2)  Lisinopril 40 Mg Tabs (Lisinopril) .... Take Two Pills By Mouth Once A Day. 3)  Lopressor 50 Mg Tabs (Metoprolol Tartrate) .... Take 1/2 Tablet By Mouth Two Times A Day 4)  Simvastatin 20 Mg Tabs (Simvastatin) .... Take 1 Tablet By Mouth Once A Day 5)  Phenobarbital 97.2 Mg Tabs (Phenobarbital) .... Take Two Tablets By Mouth At Bed Time 6)  Aspirin 81 Mg Tbec (Aspirin) .... Take 1 Tablet By Mouth Once A Day 7)  Calcarb 600 1500 Mg Tabs (Calcium Carbonate) .... Take 1 Tablet By Mouth Two Times A Day 8)  Furosemide  40 Mg  Tabs (Furosemide) .... Take 1 Tablet By Mouth Once A Day 9)  Aldactone 25 Mg Tabs (Spironolactone) .... Take 1 Tablet By Mouth Daily.  Allergies (verified): No Known Drug Allergies  Review of Systems       See HPI  Physical Exam  General:  alert, well-developed, and well-nourished.   Head:  normocephalic and atraumatic.   Eyes:  pupils equal, pupils round, and pupils reactive to light.   Mouth:  pharynx pink and moist.   Neck:  supple.   Lungs:  normal respiratory effort, normal breath sounds, no dullness, no fremitus, no crackles, and no wheezes.   Heart:  normal rate, regular rhythm, no murmur, no gallop, and no rub.   Abdomen:  soft, non-tender, normal bowel sounds, no distention, no guarding, and no rigidity.   Msk:  normal ROM, no joint tenderness, no joint swelling, no joint warmth, and no redness over joints.   Extremities:  1+ left pedal edema and 1+ right pedal edema.     Neurologic:  alert & oriented X3, cranial nerves II-XII intact, strength normal in all extremities, sensation intact to pinprick, and DTRs symmetrical and normal.     Impression & Recommendations:  Problem # 1:  ABNORMALITY OF GAIT (ICD-781.2) Assessment Unchanged  Due to difficulty walking and desiring more independence in her ADL, she expressed her concern for the power wheel chair. We referred her to the physical therapy as she needs complete assessment of the strenghths of all the muscles before she could be qualified for the power wheel chair. We also tried to explain  the possible difficulties that she be facing with her insurance in regards with the payment. We also discussed that she needs an evaluation by the neurologist for the complete work up for her b/l leg weakness but she denies at this visit. She was encouraged to continue with the PT and exercise at home .  Orders: Physical Therapy Referral (PT)  Problem # 2:  HYPERTENSION (ICD-401.9) Assessment: Deteriorated Her systolic BP was running a little bit high in 180's when she presented to the clinic. The repeat BP after sometime was in 160's. The initial high BP could be exertional or from anxiety when she came into the clinic. she did take all her BP meds in the morning. At this point  I would not change her BP medications but if she continues to have high BP with the next visit, we may consider increasing the dose for her lopressor. Her updated medication list for this problem includes:    Norvasc 10 Mg Tabs (Amlodipine besylate) .[name]... Take 1 tablet by mouth once a day    Lisinopril 40 Mg Tabs (Lisinopril) .[name]... Take two pills by mouth once a day.    Lopressor 50 Mg Tabs (Metoprolol tartrate) .[name]... Take 1/2 tablet by mouth two times a day    Furosemide 40 Mg Tabs (Furosemide) .[name]... Take 1 tablet by mouth once a day    Aldactone 25 Mg Tabs (Spironolactone) .[name]... Take 1 tablet by mouth daily.  Complete Medication List: 1)   Norvasc 10 Mg Tabs (Amlodipine besylate) .... Take 1 tablet by mouth once a day 2)  Lisinopril 40 Mg Tabs (Lisinopril) .... Take two pills by mouth once a day. 3)  Lopressor 50 Mg Tabs (Metoprolol tartrate) .... Take 1/2 tablet by mouth two times a day 4)  Simvastatin 20 Mg Tabs (Simvastatin) .... Take 1 tablet by mouth once a day 5)  Phenobarbital 97.2 Mg Tabs (  Phenobarbital) .... Take two tablets by mouth at bed time 6)  Aspirin 81 Mg Tbec (Aspirin) .... Take 1 tablet by mouth once a day 7)  Calcarb 600 1500 Mg Tabs (Calcium carbonate) .... Take 1 tablet by mouth two times a day 8)  Furosemide 40 Mg Tabs (Furosemide) .... Take 1 tablet by mouth once a day 9)  Aldactone 25 Mg Tabs (Spironolactone) .... Take 1 tablet by mouth daily.  Other Orders: Influenza Vaccine NON MCR [id number])  Patient Instructions: 1)  Please take your medicines as directed. 2)  Please make an appointment with the physical therapy for the complete evaluaion of your muscle strenghths in order to get an approval for the power wheel chair. 3)  Please continue the physical therapy and exercise at your home . 4)  Please schedule a follow-up appointment in 2 months. 5)  It is important that you exercise regularly at least 20 minutes 5 times a week. If you develop chest pain, have severe difficulty breathing, or feel very tired , stop exercising immediately and seek medical attention. 6)  You need to lose weight. Consider a lower calorie diet and regular exercise.    Orders Added: 1)  Influenza Vaccine NON MCR [00028] 2)  Physical Therapy Referral [PT] 3)  Est. Patient Level III [id number]   Immunizations Administered:  Influenza Vaccine # 1:    Vaccine Type: Fluvax Non-MCR    Site: left deltoid    Mfr: GlaxoSmithKline    Dose: 0.5 ml    Route: IM    Given by: Hilda Blades Ditzler RN    Exp. Date: 08/18/2010    Lot #: [id number]    VIS given: 09/12/09 version given December 19, 2009.  Flu Vaccine Consent Questions:    Do  you have a history of severe allergic reactions to this vaccine? no    Any prior history of allergic reactions to egg and/or gelatin? no    Do you have a sensitivity to the preservative Thimersol? no    Do you have a past history of Guillan-Barre Syndrome? no    Do you currently have an acute febrile illness? no    Have you ever had a severe reaction to latex? no    Vaccine information given and explained to patient? yes    Are you currently pregnant? no   Immunizations Administered:  Influenza Vaccine # 1:    Vaccine Type: Fluvax Non-MCR    Site: left deltoid    Mfr: GlaxoSmithKline    Dose: 0.5 ml    Route: IM    Given by: Hilda Blades Ditzler RN    Exp. Date: 08/18/2010    Lot #: [id number]    VIS given: 09/12/09 version given December 19, 2009.  Prevention & Chronic Care Immunizations   Influenza vaccine: Fluvax Non-MCR  (12/19/2009)   Influenza vaccine deferral: Not available  (09/18/2009)    Tetanus booster: 09/18/2009: Tdap    Pneumococcal vaccine: Not documented    H. zoster vaccine: Not documented  Colorectal Screening   Hemoccult: Not documented   Hemoccult action/deferral: Deferred  (06/01/2009)    Colonoscopy: Not documented   Colonoscopy action/deferral: Deferred  (09/18/2009)  Other Screening   Pap smear: Not documented   Pap smear action/deferral: Not indicated S/P hysterectomy  (06/01/2009)    Mammogram: ASSESSMENT: Negative - BI-RADS 1^MM DIGITAL SCREENING  (06/08/2009)   Mammogram action/deferral: Ordered  (06/01/2009)    DXA bone density scan: Not documented   DXA bone density action/deferral: Not indicated  (  09/18/2009)   Smoking status: quit  (12/19/2009)  Lipids   Total Cholesterol: 185  (06/08/2009)   LDL: 104  (06/08/2009)   LDL Direct: Not documented   HDL: 66  (06/08/2009)   Triglycerides: 73  (06/08/2009)    SGOT (AST): 23  (06/01/2009)   SGPT (ALT): 19  (06/01/2009)   Alkaline phosphatase: 73  (06/01/2009)   Total bilirubin: 0.3   (06/01/2009)  Hypertension   Last Blood Pressure: 183 / 83  (12/19/2009)   Serum creatinine: 1.33  (06/08/2009)   Serum potassium 4.1  (06/08/2009)  Self-Management Support :   Personal Goals (by the next clinic visit) :      Personal blood pressure goal: 140/90  (09/18/2009)     Personal LDL goal: 100  (09/18/2009)    Patient will work on the following items until the next clinic visit to reach self-care goals:     Medications and monitoring: take my medicines every day, bring all of my medications to every visit  (12/19/2009)     Eating: drink diet soda or water instead of juice or soda, eat more vegetables, use fresh or frozen vegetables, eat foods that are low in salt, eat baked foods instead of fried foods, limit or avoid alcohol  (12/19/2009)     Activity: take a 30 minute walk every day  (12/19/2009)    Hypertension self-management support: Written self-care plan, Education handout, Resources for patients handout  (12/19/2009)   Hypertension self-care plan printed.   Hypertension education handout printed    Lipid self-management support: Written self-care plan, Education handout, Resources for patients handout  (12/19/2009)   Lipid self-care plan printed.   Lipid education handout printed      Resource handout printed.   Nursing Instructions: Give Flu vaccine today      Appended Document:  I discussed the patient with Dr. Leonia Reeves and I agree with the assessment and plan as outlined above. Her BP is elevated but all meds at max except her BB but HR will not toelrate further excalation on BB. Will need to verify compliance of meds and add another agent or investigate secondary causes at next visit.  Appended Document:  The cause of her gait abnl has not been determined. It seems to be a rather sig abnl. She had inflamm markers that were nl earlier this year. She had an MRI in 2008 that was done for "blacking out" that was essentially nl. Does she need neuro eval as this  is sig impairing her life?  Appended Document:  Pt had been given a F/U appt with Devani in Jan. I sent a flag to Ms Cyndi Bender to cancel that appt and to sch with Dr Shon Baton - even if means an earlier or later appt than requested bc I would like Dr Shon Baton to address whether neuro eval is indicated.

## 2010-03-20 NOTE — Progress Notes (Signed)
Summary: med refill/gp  Phone Note Refill Request Message from:  Fax from Pharmacy on June 05, 2009 10:37 AM  Refills Requested: Medication #1:  NORVASC 10 MG TABS Take 1 tablet by mouth once a day   Last Refilled: 05/07/2009  Method Requested: Electronic Initial call taken by: Morrison Old RN,  June 05, 2009 10:37 AM    Prescriptions: NORVASC 10 MG TABS (AMLODIPINE BESYLATE) Take 1 tablet by mouth once a day  #30 x 3   Entered and Authorized by:   Niel Hummer MD   Signed by:   Niel Hummer MD on 06/05/2009   Method used:   [facility]        [facility] [address] [phone]* (retail)       [address]       [place], [place]  [address]       Ph: [phone] or [phone]       Fax: [phone]   RxID:   [id number]

## 2010-03-20 NOTE — Progress Notes (Signed)
Summary: refill/ hla  Phone Note Refill Request Message from:  Fax from Pharmacy on October 10, 2009 11:06 AM  Refills Requested: Medication #1:  PHENOBARBITAL 97.2 MG TABS Take two tablets by mouth at bed time   Dosage confirmed as above?Dosage Confirmed   Supply Requested: 6 months   Last Refilled: 7/21 last visit 8/1, last labs 6/13  Initial call taken by: Freddy Finner RN,  October 10, 2009 11:07 AM  Additional Follow-up for Phone Call Additional follow up Details #1::        Rx called to pharmacy Additional Follow-up by: Freddy Finner RN,  October 10, 2009 4:17 PM    Prescriptions: PHENOBARBITAL 97.2 MG TABS (PHENOBARBITAL) Take two tablets by mouth at bed time  #62 x 5   Entered and Authorized by:   Oval Linsey MD   Signed by:   Oval Linsey MD on 10/10/2009   Method used:   Telephoned to ...       [id number]* (retail)       [address] [place], [place]  [address]       Ph: [phone]       Fax: [phone]   RxID:   [id number]

## 2010-03-22 NOTE — Assessment & Plan Note (Signed)
Summary: EST-CK/FU/MEDS/CFB ALSO SCOOTER PAPERWORK/CFB   Vital Signs:  Patient profile:   65 year old female Height:      61 inches (154.94 cm) Weight:      235.8 pounds (107.18 kg) BMI:     44.72 Temp:     97.8 degrees F (36.56 degrees C) oral Pulse rate:   73 / minute BP sitting:   162 / 77  (right arm)  Vitals Entered By: Hilda Blades Ditzler RN (February 08, 2010 2:24 PM) Is Patient Diabetic? No Pain Assessment Patient in pain? no      Nutritional Status BMI of > 30 = obese Nutritional Status Detail appetite good  Have you ever been in a relationship where you felt threatened, hurt or afraid?denies   Does patient need assistance? Functional Status Self care, Cook/clean, Shopping, Social activities Ambulation Wheelchair Comments Assist from family - daughter with pt. Discuss leg weakness - scotter was denied - ? reason.   Primary Care Provider:  Rikki Spearing, MD   History of Present Illness: Patient comes in complaining of continued bilateral leg weakness and difficulty staightening them out.  She has completed a course of physical therapy and though she was unable to complete formal PT as her insurance "ran out", she has continued to do the exercises at home, and though she has noticed some improvement, she continues to be debiliated by her weakness.  She can perform some ADLs, but she gets help to do them properly, such as dressing and bathing.  She is unable to really cook anymore because of her weakness, and her husband helps her a lot with her activities.  She says that what limits her most is not necessarily pain but a lack of strength.  She would like motorized wheelchair, but she does not think that she will be approved by her insurance company.  She is eager to do whatever she can to improve her functioning.    Pt denies CP, SOB, numbness or tingling, any new back pain, cold symptoms, bowel or bladder changes.  No vision changes.  Depression History:      The patient  denies a depressed mood most of the day and a diminished interest in her usual daily activities.         Preventive Screening-Counseling & Management  Alcohol-Tobacco     Smoking Status: quit     Packs/Day: 0.2     Year Started: LATE 30'S- 1pk/mth     Year Quit: 2005  Caffeine-Diet-Exercise     Does Patient Exercise: no  Current Medications (verified): 1)  Norvasc 10 Mg Tabs (Amlodipine Besylate) .... Take 1 Tablet By Mouth Once A Day 2)  Lisinopril 40 Mg Tabs (Lisinopril) .... Take Two Pills By Mouth Once A Day. 3)  Lopressor 50 Mg Tabs (Metoprolol Tartrate) .... Take Tablet By Mouth Two Times A Day 4)  Phenobarbital 97.2 Mg Tabs (Phenobarbital) .... Take Two Tablets By Mouth At Bed Time 5)  Aspirin 81 Mg Tbec (Aspirin) .... Take 1 Tablet By Mouth Once A Day 6)  Calcarb 600 1500 Mg Tabs (Calcium Carbonate) .... Take 1 Tablet By Mouth Two Times A Day 7)  Furosemide 40 Mg  Tabs (Furosemide) .... Take 1 Tablet By Mouth Once A Day 8)  Aldactone 25 Mg Tabs (Spironolactone) .... Take 1 Tablet By Mouth Daily.  Allergies (verified): No Known Drug Allergies  Past History:  Past medical, surgical, family and social histories (including risk factors) reviewed, and no changes noted (except as noted below).  Past Medical History: Reviewed history from 06/28/2008 and no changes required. HTN HYPERLIPIDEMIA CHRONIC BACK PAIN, secondary to trauma H/o seizure disorder: First began in 1998                                   EEG(6/01):Diffuse and Rt posterior temporal slowing                                   Seen by Dr. Erling Cruz, neurologist in 2003                                   ?Complex partial                                   Idiopathic H/o Cardiac cath in 1992 Chronic LE edema    - Dopplers negative in 07/2007  Past Surgical History: Reviewed history from 09/18/2009 and no changes required. Hysterectomy - 30 years ago  Family History: Reviewed history from 09/18/2009 and no changes  required. Father: Died from prostate cancer Mother: Cardiomegaly, died of stroke at 36 y/o No FH of diabetes.  Social History: Reviewed history from 09/18/2009 and no changes required. Former Smoker (stopped in 2005 but smoked for 20+ yrs). 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  Married and lives with husband.  Two adult children.  Review of Systems       See HPI  Physical Exam  General:  alert, well-developed, and well-nourished.   Mouth:  pharynx pink and moist.   Neck:  supple.   Lungs:  normal respiratory effort, normal breath sounds, no dullness, no fremitus, no crackles, and no wheezes.   Heart:  normal rate, regular rhythm, no murmur, no gallop, and no rub.   Abdomen:  obese, soft, non-tender, normal bowel sounds, no distention, no guarding, and no rigidity.   Msk:  bilateral knees with reduced range of motion with pain elicited on maximal passive extension ( ~75-80 degrees) of both knees   Pulses:  2+ bilateral pedal pulses  Extremities:  no edema.   Neurologic:  3+/5 strength in bilateral hip flexors and quadriceps. sensation normal.  CN grossly intact. A+Ox4.  gait severely antalgic - pt bears much of her weight on her arms to both rise to stand and to maintain standing Skin:  turgor normal.   Cervical Nodes:  no anterior cervical adenopathy.   Psych:  Oriented X3, memory intact for recent and remote, normally interactive, good eye contact, not anxious appearing, and not depressed appearing.     Impression & Recommendations:  Problem # 1:  ABNORMALITY OF GAIT (ICD-781.2) Discussed with Dr. Marinda Elk.  Patient with continued weakness with severe limitation of gait.  She has improved with PT somewhat.  Patient has been on statin since 2008; her symptoms began early in 2011.  Given the distribution of her weakness, it is possible that she is experiencing some side effect from statin use, which does not necessarily need to  correlate with just starting a statin.  CK was minimally elevated when I last saw the patient, and I attributed this to some falls she suffered before that visit.  Will recheck this today.  Will hold  statin for 1 month and consider restarting a different statin at that time.  Will also make neurology referral as patient's symptoms concerning for organic cause of weakness. For screening will check CBC, TSH, CMP, and phenobarbital level.  Orders: Neurology Referral (Neuro) T-CBC No Diff [id number]) T-CMP with Estimated GFR ([id number]) T-TSH [phone]) T-CK Total ([phone]) T-Phenobarbital [id number])  Problem # 2:  HYPERTENSION (ICD-401.9) Repeat BP 172/72 - very much above goal.  Pt on a complex regimen.  Will increase beta blocker after discussing case with Dr. Marinda Elk.  Reviewed patient's pill bottles and she appears to be taking her medicines as instructed.  If BP still not controlled at next visit, should investigate secondary causes of hypertension.  Her updated medication list for this problem includes:    Norvasc 10 Mg Tabs (Amlodipine besylate) .[name]... Take 1 tablet by mouth once a day    Lisinopril 40 Mg Tabs (Lisinopril) .[name]... Take two pills by mouth once a day.    Lopressor 50 Mg Tabs (Metoprolol tartrate) .[name]... Take tablet by mouth two times a day    Furosemide 40 Mg Tabs (Furosemide) .[name]... Take 1 tablet by mouth once a day    Aldactone 25 Mg Tabs (Spironolactone) .[name]... Take 1 tablet by mouth daily.  Complete Medication List: 1)  Norvasc 10 Mg Tabs (Amlodipine besylate) .... Take 1 tablet by mouth once a day 2)  Lisinopril 40 Mg Tabs (Lisinopril) .... Take two pills by mouth once a day. 3)  Lopressor 50 Mg Tabs (Metoprolol tartrate) .... Take tablet by mouth two times a day 4)  Phenobarbital 97.2 Mg Tabs (Phenobarbital) .... Take two tablets by mouth at bed time 5)  Aspirin 81 Mg Tbec (Aspirin) .... Take 1 tablet by mouth once a day 6)  Calcarb 600 1500 Mg Tabs (Calcium  carbonate) .... Take 1 tablet by mouth two times a day 7)  Furosemide 40 Mg Tabs (Furosemide) .... Take 1 tablet by mouth once a day 8)  Aldactone 25 Mg Tabs (Spironolactone) .... Take 1 tablet by mouth daily.  Patient Instructions: 1)  Take 1 whole metoprolol twice daily. 2)  Stop taking your cholesterol medicine (simvastatin). 3)  You will have a referral to a neurologist. 4)  Follow-up in the clinic in one month. 5)  Keep doing your physical therapy exercises. 6)  You had labwork done today; I will call you if anything is unusual. Prescriptions: LOPRESSOR 50 MG TABS (METOPROLOL TARTRATE) Take tablet by mouth two times a day  #60 x 5   Entered and Authorized by:   Rikki Spearing, MD   Signed by:   Rikki Spearing, MD on 02/08/2010   Method used:   Electronically to        [id number] (retail)       [address]       [place],   [address]       Ph: [phone]       Fax: [phone]   RxID:   [id number]    Orders Added: 1)  Neurology Referral [Neuro] 2)  T-CBC No Diff [id number] 3)  T-CMP with Estimated GFR [[id number]] 4)  T-TSH [id number] 5)  T-CK Total [[id number]] 6)  T-Phenobarbital [id number] 7)  Est. Patient Level IV [id number]   Process Orders Check Orders Results:     [facility]: [id number] not required for this insurance Tests Sent for requisitioning (February 08, 2010 7:19 PM):  02/08/2010: [facility] -- T-CBC No Diff [[id number]] (signed)     02/08/2010: [facility] -- T-CMP with Estimated GFR [[id number]] (signed)     02/08/2010: [facility] -- T-TSH [phone] (signed)     02/08/2010: [facility] -- T-CK Total [[id number]] (signed)     02/08/2010: [facility] -- T-Phenobarbital [id number] (signed)     Prevention & Chronic Care Immunizations   Influenza vaccine: Fluvax Non-MCR  (12/19/2009)    Influenza vaccine deferral: Not available  (09/18/2009)    Tetanus booster: 09/18/2009: Tdap    Pneumococcal vaccine: Not documented    H. zoster vaccine: Not documented  Colorectal Screening   Hemoccult: Not documented   Hemoccult action/deferral: Deferred  (06/01/2009)    Colonoscopy: Not documented   Colonoscopy action/deferral: Deferred  (09/18/2009)  Other Screening   Pap smear: Not documented   Pap smear action/deferral: Not indicated S/P hysterectomy  (06/01/2009)    Mammogram: ASSESSMENT: Negative - BI-RADS 1^MM DIGITAL SCREENING  (06/08/2009)   Mammogram action/deferral: Ordered  (06/01/2009)    DXA bone density scan: Not documented   DXA bone density action/deferral: Not indicated  (09/18/2009)   Smoking status: quit  (02/08/2010)  Lipids   Total Cholesterol: 185  (06/08/2009)   LDL: 104  (06/08/2009)   LDL Direct: Not documented   HDL: 66  (06/08/2009)   Triglycerides: 73  (06/08/2009)    SGOT (AST): 23  (06/01/2009)   SGPT (ALT): 19  (06/01/2009)   Alkaline phosphatase: 73  (06/01/2009)   Total bilirubin: 0.3  (06/01/2009)  Hypertension   Last Blood Pressure: 162 / 77  (02/08/2010)   Serum creatinine: 1.33  (06/08/2009)   Serum potassium 4.1  (06/08/2009)  Self-Management Support :   Personal Goals (by the next clinic visit) :      Personal blood pressure goal: 140/90  (09/18/2009)     Personal LDL goal: 100  (09/18/2009)    Patient will work on the following items until the next clinic visit to reach self-care goals:     Medications and monitoring: take my medicines every day, check my blood pressure, bring all of my medications to every visit, weigh myself weekly  (02/08/2010)     Eating: drink diet soda or water instead of juice or soda, eat more vegetables, use fresh or frozen vegetables, eat foods that are low in salt, eat baked foods instead of fried foods, eat fruit for snacks and desserts, limit or avoid alcohol  (02/08/2010)     Activity:  take a 30 minute walk every day  (12/19/2009)    Hypertension self-management support: Written self-care plan, Education handout, Resources for patients handout  (02/08/2010)   Hypertension self-care plan printed.   Hypertension education handout printed    Lipid self-management support: Written self-care plan, Education handout, Resources for patients handout  (02/08/2010)   Lipid self-care plan printed.   Lipid education handout printed      Resource handout printed.  Process Orders Check Orders Results:     [facility]: [id number] not required for this insurance Tests Sent for requisitioning (February 08, 2010 7:19 PM):     02/08/2010: [facility] -- T-CBC No Diff [id number] (signed)     02/08/2010: [facility] -- T-CMP with Estimated GFR [[id number]] (signed)     02/08/2010: [facility] -- T-TSH [phone] (signed)     02/08/2010: [facility] -- T-CK Total [id number] (signed)     02/08/2010: [facility] -- T-Phenobarbital [id number] (  signed)

## 2010-03-22 NOTE — Assessment & Plan Note (Signed)
Summary: ACUTE/[facility]/1 MONTH F/U VISIT/CH   Vital Signs:  Patient profile:   64 year old female Height:      61 inches (154.94 cm) Weight:      235.1 pounds (106.86 kg) BMI:     44.58 Temp:     98.0 degrees F (36.67 degrees C) oral Pulse rate:   61 / minute BP sitting:   184 / 71  (right arm)  Vitals Entered By: Hilda Blades Ditzler RN (March 13, 2010 10:04 AM) Is Patient Diabetic? No Pain Assessment Patient in pain? yes     Location: back neck Intensity: 8 Type: aching Onset of pain  off and on long time Nutritional Status BMI of > 30 = obese Nutritional Status Detail appetite good  Have you ever been in a relationship where you felt threatened, hurt or afraid?denies   Does patient need assistance? Functional Status Self care Ambulation Wheelchair Comments Daughter with pt. FU - went to ER 03/09/10 - BP up. Pt has notice breathing 2 breath each time. BP at 10:50AM left arm 143/80 pulse 83                          right arm 145/70 pulse 58   Primary Care Provider:  Rikki Spearing, MD   History of Present Illness: Pt is a 65 y/o F with PMH outlined in the EMR who presents today for f/u visit.  Pt reports recent ED visit for ? syncopal event.  Pt reports she blacked out and woke up with paramedics present.  SHe has had similar episode before.  She has a hx of sz d/o and she feels these symptoms may be realted to er seizures.   Work up at that time [employer] 8, EKG) was negative.  Pt plans to sched an appt with neurology once she is able to afford the visit.     HTN: pts BP still elevated well above goal despite therapy with lisinopril 80mg , lopressor 50mg  b.i.d, norvasc 10mg , Lasix 40mg , and aldactone 25mg .  She reports no side effects with her medicines and states she is 100% compliant with all  meds.  She states last summer her BP was under better control - at that time pt has more active and under less stress.  Pt has no other concerns or complaints  today.     Depression History:      The patient denies a depressed mood most of the day and a diminished interest in her usual daily activities.         Preventive Screening-Counseling & Management  Alcohol-Tobacco     Smoking Status: quit     Packs/Day: 0.2     Year Started: LATE 30'S- 1pk/mth     Year Quit: 2005  Caffeine-Diet-Exercise     Does Patient Exercise: no  Current Medications (verified): 1)  Norvasc 10 Mg Tabs (Amlodipine Besylate) .... Take 1 Tablet By Mouth Once A Day 2)  Lisinopril 40 Mg Tabs (Lisinopril) .... Take Two Pills By Mouth Once A Day. 3)  Lopressor 50 Mg Tabs (Metoprolol Tartrate) .... Take Tablet By Mouth Two Times A Day 4)  Phenobarbital 97.2 Mg Tabs (Phenobarbital) .... Take Two Tablets By Mouth At Bed Time 5)  Aspirin 81 Mg Tbec (Aspirin) .... Take 1 Tablet By Mouth Once A Day 6)  Calcarb 600 1500 Mg Tabs (Calcium Carbonate) .... Take 1 Tablet By Mouth Two Times A Day 7)  Furosemide 40 Mg  Tabs (Furosemide) .Marland KitchenMarland KitchenMarland Kitchen  Take 1 Tablet By Mouth Once A Day 8)  Spironolactone 50 Mg Tabs (Spironolactone) .... Take 1 Tablet By Mouth Once A Day  Allergies (verified): No Known Drug Allergies  Past History:  Past medical, surgical, family and social histories (including risk factors) reviewed for relevance to current acute and chronic problems.  Past Medical History: Reviewed history from 06/28/2008 and no changes required. HTN HYPERLIPIDEMIA CHRONIC BACK PAIN, secondary to trauma H/o seizure disorder: First began in 1998                                   EEG(6/01):Diffuse and Rt posterior temporal slowing                                   Seen by Dr. Erling Cruz, neurologist in 2003                                   ?Complex partial                                   Idiopathic H/o Cardiac cath in 1992 Chronic LE edema    - Dopplers negative in 07/2007  Past Surgical History: Reviewed history from 09/18/2009 and no changes required. Hysterectomy - 30 years  ago  Family History: Reviewed history from 09/18/2009 and no changes required. Father: Died from prostate cancer Mother: Cardiomegaly, died of stroke at 34 y/o No FH of diabetes.  Social History: Reviewed history from 09/18/2009 and no changes required. Former Smoker (stopped in 2005 but smoked for 20+ yrs). 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  Married and lives with husband.  Two adult children.  Physical Exam  General:  alert, well-developed, and well-nourished.   Head:  normocephalic and atraumatic.   Eyes:  pupils equal, pupils round, and pupils reactive to light.   Lungs:  normal respiratory effort, normal breath sounds, no dullness, no fremitus, no crackles, and no wheezes.   Heart:  normal rate, regular rhythm Extremities:  no edema.   Neurologic:  alert & oriented X3 and sensation intact to light touch.   Skin:  turgor normal, color normal, no rashes, and no edema.   Psych:  Oriented X3, memory intact for recent and remote, normally interactive, good eye contact, not anxious appearing, and not depressed appearing.     Impression & Recommendations:  Problem # 1:  HYPERTENSION (ICD-401.9) Pt continues to experience elevated BP well above goal despite aggressive tx, this may reflect underlying secondary htn.  WIll obtain duplex u/s to eval for renal artery stenosis.  Considered and discussed checking plasma renin/aldosterone with attending; this would require pt to stop BP meds (at least aldactone and probably ace, if not all meds) for at least 6 weeks before labs could be obtained.  In light of this and the fact that pt has not had any problems with hypokalemia and is without edema on exam, will not obtain aforementioned labs and increase aldactone to 50mg  qd.  This was discussed with pt and daughter, both agree with plan.    Will repeat BP measurements in both arm.  Pt intructed to record home BP measurements in a log book and  bring the readings with her to her next appt.  WIll have her f/u in 4-6 weeks for BP check and BMET.  Would also consider spot urine for prot and Cr,especially if pts cr remains elevated (last cr of 1.4).     Her repeat BP in L arm was 143/80 and in the R arm 145/70 -  this is reassuring. Her updated medication list for this problem includes:    Norvasc 10 Mg Tabs (Amlodipine besylate) .[name]... Take 1 tablet by mouth once a day    Lisinopril 40 Mg Tabs (Lisinopril) .[name]... Take two pills by mouth once a day.    Lopressor 50 Mg Tabs (Metoprolol tartrate) .[name]... Take tablet by mouth two times a day    Furosemide 40 Mg Tabs (Furosemide) .[name]... Take 1 tablet by mouth once a day    Spironolactone 50 Mg Tabs (Spironolactone) .[name]... Take 1 tablet by mouth once a day  Problem # 2:  SEIZURE DISORDER (ICD-780.39) It is unclear if pts "blackout" episodes are the result of her underlying sz d/o.  These episodes are a chronic problem for Ms. Blackeney and are unchanged from usual per pt.  Will check phenobarb level at next visit,  Her updated medication list for this problem includes:    Phenobarbital 97.2 Mg Tabs (Phenobarbital) .[name]... Take two tablets by mouth at bed time   >48min spent reviewing previous lab result of office records with at least  50% face to face time spent couseling pt about her chronic medical conditions.  Complete Medication List: 1)  Norvasc 10 Mg Tabs (Amlodipine besylate) .... Take 1 tablet by mouth once a day 2)  Lisinopril 40 Mg Tabs (Lisinopril) .... Take two pills by mouth once a day. 3)  Lopressor 50 Mg Tabs (Metoprolol tartrate) .... Take tablet by mouth two times a day 4)  Phenobarbital 97.2 Mg Tabs (Phenobarbital) .... Take two tablets by mouth at bed time 5)  Aspirin 81 Mg Tbec (Aspirin) .... Take 1 tablet by mouth once a day 6)  Calcarb 600 1500 Mg Tabs (Calcium carbonate) .... Take 1 tablet by mouth two times a day 7)  Furosemide 40 Mg Tabs (Furosemide) .... Take 1  tablet by mouth once a day 8)  Spironolactone 50 Mg Tabs (Spironolactone) .... Take 1 tablet by mouth once a day  Other Orders: Ultrasound (Ultrasound)  Patient Instructions: 1)  Please schedule a follow-up appointment in 4-6 weeks with Dr. Shon Baton or Dr. Jerelene Redden. 2)  We will set up a renal ultrasound test to help figure out why your blood pressure is so high. 3)  Your spironolactone (aldactone) was increased from 25mg  to 50mg  once daily. 4)  A new prescription for spironolactone was sent to your pharmacy along with refills on your other medicines. Prescriptions: SPIRONOLACTONE 50 MG TABS (SPIRONOLACTONE) Take 1 tablet by mouth once a day  #30 x 1   Entered and Authorized by:   Eduardo Osier DO   Signed by:   Eduardo Osier DO on 03/13/2010   Method used:   Electronically to        [facility] (retail)       [address] [place], [place]  [address]       Ph: [phone]       Fax: [phone]   RxID:   [id number]    Orders Added: 1)  Ultrasound [Ultrasound] 2)  Est.  Patient Level IV [id number]    {if (DOCUMENT.[id number] == "Yes") then CFMT(" Prevention & Chronic Care", "B,2","","") else ""   endif + " " + IF NOT ([phone] <> ""  ) THEN "" ELSE ("") ENDIF + IF NOT (DOCUMENT.PC:[id number] == ""  ) THEN "" ELSE ("") ENDIF + if (DOCUMENT.[phone] == "Yes") then CFMT("Immunizations", "B,1","","") else ""   endif + " " + IF NOT (TRUE) THEN "" ELSE (IF NOT (TRUE) THEN "" ELSE (if ([phone] == "Yes") then CFMT(LASTTEST2("FLU VAX"), "", "  Influenza vaccine: ", "B", " ") else ""   endif) ENDIF + IF NOT (ShowDue("FLU VAX","FLUVAXDUE",[id number],600,183,1320,[id number] )== "noshowdue"  AND match(DOCUMENT.N_[id number]_4,1,"Give Flu vaccine today")==0 ) THEN "" ELSE ("") ENDIF + IF NOT (match(DOCUMENT.N_[id number]_4,1,"Give Flu vaccine  today")>0) THEN "" ELSE ("") ENDIF + IF NOT (ShowDue("FLU VAX","FLUVAXDUE",[id number],  [id number] ) == "showdue"  AND match(DOCUMENT.N_[id number]_4,1,"Give Flu vaccine today")==0 ) THEN "" ELSE ("") ENDIF + if (DOCUMENT.[phone] == "Yes" AND LASTOBSVALUE("FLUVAXDECLN") <> "") then CFMT(LASTTEST2("FLUVAXDECLN"), "", "  Influenza vaccine deferral: ", "B", " ") else ""   endif + IF NOT (not(DOCUMENT.[phone] <> "" and [phone] =="")  ) THEN "" ELSE ("") ENDIF + IF NOT ([phone] <> "" and [phone] ==""  ) THEN "" ELSE ("") ENDIF + if (DOCUMENT.[id number] == "Yes") then CFMT([id number], "", "  Influenza vaccine due: ", "B", " ") else ""   endif) ENDIF + IF NOT (PATIENT._AGEINMONTHS > "216" ) THEN "" ELSE (IF NOT (TRUE) THEN "" ELSE (if (DOCUMENT.[phone] == "Yes") then CFMT(LASTTEST("TD BOOSTER"), "", "   Tetanus booster: ", "B", " ") else ""   endif) ENDIF + IF NOT (ShowDue("TD BOOSTER","TDBOOSTDUE",[id number],216,3650,1320,[id number]) == "noshowdue"  AND match(DOCUMENT.N_[id number]_4,1,"Give tetanus booster today")==0  ) THEN "" ELSE ("") ENDIF + IF NOT (match(DOCUMENT.N_2682_[id number]_4,1,"Give tetanus booster today")>0) THEN "" ELSE ("") ENDIF + IF NOT (ShowDue("TD BOOSTER","TDBOOSTDUE",[id number],216,3650,1320,[id number]) == "showdue"  AND match(DOCUMENT.N_2682_[id number]_4,1,"Give tetanus booster today")==0  ) THEN "" ELSE ("") ENDIF + if ([phone] == "Yes" AND LASTOBSVALUE("TDDECLINED") <> "") then CFMT(LASTTEST2("TDDECLINED"), "", "  Td booster deferral: ", "B", " ") else ""   endif + IF NOT (not([phone] <> "" and [phone] =="")  ) THEN "" ELSE ("") ENDIF + IF NOT ([phone] <> "" and [phone] ==""  ) THEN ""  ELSE ("") ENDIF + if (DOCUMENT.[phone] == "Yes") then CFMT([id number], "", "  Tetanus booster due: ", "B", " ") else ""   endif) ENDIF + IF NOT (TRUE) THEN "" ELSE (IF NOT (TRUE) THEN "" ELSE (if ([phone] == "Yes") then CFMT(LASTTEST2("PNEUMOVAX"), "", "   Pneumococcal vaccine: ", "B", " ") else ""   endif) ENDIF + IF NOT ((   ShowDue("PNEUMOVAX","PNEUMVXNEXT",[id number],780,3650,1320,[id number] ) == "noshowdue"  AND  not(LASTOBSVALUE("PNEUMOVAX") <> "" AND PATIENT._AGEINMONTHS >= 780 AND  DURATIONDAYS(str(LASTOBSDATE("PNEUMOVAX")),ADDDATES(str(PATIENT.DATEOFBIRTH),"65","0","0"))>0  AND (DURATIONDAYS(str(LASTOBSDATE("PNEUMOVAX")),str(._TODAYSDATE)) >= 1825)) ) AND match(DOCUMENT.N_[id number]_4,1,"Give Pneumovax today")==0 ) THEN "" ELSE ("") ENDIF + IF NOT (match(DOCUMENT.N_2682_[id number]_4,1,"Give Pneumovax today")>0) THEN "" ELSE ("") ENDIF + IF NOT ((ShowDue("PNEUMOVAX","PNEUMVXNEXT",[id number],780,3650,1320,[id number] ) == "showdue"  OR  (ShowDue("PNEUMOVAX","PNEUMVXNEXT",[id number], 780,3650,1320,DOCUMENT.PNEUMO_PRIOR )  <> "noshow"  AND  LASTOBSVALUE("PNEUMOVAX") <> "" AND PATIENT._AGEINMONTHS >= 780 AND  DURATIONDAYS(str(LASTOBSDATE("PNEUMOVAX")),ADDDATES(str(PATIENT.DATEOFBIRTH),"65","0","0"))>0  AND (DURATIONDAYS(str(LASTOBSDATE("PNEUMOVAX")),str(._TODAYSDATE)) >= 1825))) AND match(DOCUMENT.N_2682_[id number]_4,1,"Give Pneumovax today")==0 ) THEN "" ELSE ("") ENDIF + if (DOCUMENT.[phone] == "Yes" AND LASTOBSVALUE("PNEUVAXDECLN") <> "") then CFMT(LASTTEST2("PNEUVAXDECLN"), "", "  Pneumococcal vaccine deferral: ", "B", " ") else ""   endif + IF NOT (not (DOCUMENT.5172115617 <> "" and [phone] =="")  ) THEN "" ELSE ("") ENDIF + IF NOT ([phone] <> "" and [phone] ==""  ) THEN "" ELSE ("") ENDIF +  if  (DOCUMENT.[phone] == "Yes") then CFMT([id number], "", "  Pneumococcal vaccine due: ", "B", " ") else ""   endif) ENDIF + IF NOT (PATIENT._AGEINMONTHS >= "720" ) THEN "" ELSE (IF NOT (TRUE) THEN "" ELSE (if (DOCUMENT.[phone] == "Yes") then CFMT(LASTTEST("ZOSTAVAX"), "", "   H. zoster vaccine: ", "B", " ") else ""   endif) ENDIF + IF NOT (ShowDue("ZOSTAVAX","HZOSTERNEXT",[id number],720,18250,1320,[id number] ) == "noshowdue"  AND match(DOCUMENT.N_2682_[id number]_4,1,"Give Herpes zoster vaccine today")==0 ) THEN "" ELSE ("") ENDIF + IF NOT (match(DOCUMENT.N_2682_[id number]_4,1,"Give Herpes zoster vaccine today")>0) THEN "" ELSE ("") ENDIF + IF NOT (ShowDue("ZOSTAVAX","HZOSTERNEXT",[id number],720,18250,1320,[id number] ) == "showdue"  AND match(DOCUMENT.N_2682_[id number]_4,1,"Give Herpes zoster vaccine today")==0 ) THEN "" ELSE ("") ENDIF + if (DOCUMENT.[phone] == "Yes" AND LASTOBSVALUE("ZOSTREFUSED") <> "") then CFMT(LASTTEST2("ZOSTREFUSED"), "", "  H. zoster vaccine deferral: ", "B", " ") else ""   endif) ENDIF + if (DOCUMENT.I_2682_1293544386_300== "Yes") then CFMT([phone], "", "   Immunization comments: ", "B", " ") else ""   endif + IF NOT (PATIENT._AGEINMONTHS=>"600") THEN "" ELSE (if (DOCUMENT.[phone] == "Yes") then FMT(" Colorectal Screening", "B,1") else ""   endif + " ") ENDIF + IF NOT (PATIENT._AGEINMONTHS=>"600") THEN "" ELSE (IF NOT (TRUE) THEN "" ELSE (if ([phone] == "Yes") then CFMT(LASTTEST2("HEMOCCULT"), "", "  Hemoccult: ", "B", " ") else ""   endif) ENDIF + IF NOT (ShowDue("HEMOCCULT","HEMOCULTDUE",[id number],600,365,1320,[id number] )  == "noshowdue"  AND match(DOCUMENT.NEWORDERS,1,"82270")==0 AND match(DOCUMENT.NEWORDERS,1,"Hemoccult Cards")==0  ) THEN "" ELSE ("") ENDIF + IF NOT  (ShowDue("HEMOCCULT","HEMOCULTDUE",[id number],600,365,1320,[id number] ) == "showdue"  AND match(DOCUMENT.NEWORDERS,1,"82270")==0 AND match(DOCUMENT.NEWORDERS,1,"Hemoccult Cards")==0 ) THEN "" ELSE ("") ENDIF + IF NOT (match(DOCUMENT.NEWORDERS,1,"82270")>0 OR match(DOCUMENT.NEWORDERS,1,"Hemoccult Cards")>0) THEN "" ELSE ("") ENDIF + if (DOCUMENT.[phone] == "Yes" AND LASTOBSVALUE("HEMOCCRECACT") <> "") then CFMT(LASTTEST2("HEMOCCRECACT"), "", "  Hemoccult action/deferral: ", "B", " ") else ""   endif + IF NOT (not([phone] <> "" and [phone] =="" ) ) THEN "" ELSE ("") ENDIF + IF NOT (DOCUMENT.[phone] <> "" and [phone] ==""  ) THEN "" ELSE ("") ENDIF + if (DOCUMENT.I_2682_[id number]_316== "Yes") then CFMT([id number], "", "  Hemoccult due: ", "B", " ") else ""   endif) ENDIF + IF NOT (PATIENT._AGEINMONTHS=>"600") THEN "" ELSE (IF NOT (TRUE) THEN "" ELSE (if (DOCUMENT.[phone] == "Yes") then CFMT(LASTTEST2("COLONOSCOPY"), "", "   Colonoscopy: ", "B", " ") else ""   endif) ENDIF + IF NOT (ShowDue("COLONOSCOPY","COLONNXTDUE",[id number],600,3650,1320,[id number]) == "noshowdue"  AND match(DOCUMENT.NEWORDERS,1,"GI")==0  AND match(DOCUMENT.NEWORDERS,1,"Colon")==0 ) THEN "" ELSE ("") ENDIF + IF NOT (ShowDue("COLONOSCOPY","COLONNXTDUE",[id number],600,3650,1320,[id number]) == "showdue"  AND match(DOCUMENT.NEWORDERS,1,"GI")==0  AND match(DOCUMENT.NEWORDERS,1,"Colon")==0 ) THEN "" ELSE ("") ENDIF + IF NOT (match(DOCUMENT.NEWORDERS,1,"GI")>0) THEN "" ELSE ("") ENDIF + IF NOT (match(ORDERS_NEW("comma"),1,"Colon")>0) THEN "" ELSE ("") ENDIF + if (DOCUMENT.[phone] == "Yes" AND LASTOBSVALUE("COLONRECACT") <> "") then CFMT(LASTTEST2("COLONRECACT"), "", "  Colonoscopy action/deferral: ", "B", " ") else ""   endif + IF NOT  (not([phone] <> "" and [phone] =="" ) ) THEN "" ELSE ("") ENDIF + IF NOT ([phone] <> "" and [phone] ==""  ) THEN "" ELSE ("") ENDIF + if (DOCUMENT.I_2682_[id number]_316== "Yes") then CFMT([id number], "", "  Colonoscopy due: ", "B", " ") else ""   endif) ENDIF + IF NOT (TRUE) THEN "" ELSE (if (DOCUMENT.[phone] == "Yes") then FMT(" Other Screening", "B,1") else ""   endif + " ") ENDIF + IF NOT (PATIENT.SEX=="F" ) THEN "" ELSE (IF NOT (TRUE) THEN "" ELSE (if ([phone] == "Yes") then CFMT(LASTTEST2("PAP SMEAR"), "", "  Pap smear: ", "B", " ") else ""   endif) ENDIF + IF NOT (ShowDue("PAP SMEAR","PAP [phone]) == "noshowdue"  AND match(DOCUMENT.NEWORDERS,1,"88142")==0  AND match(DOCUMENT.NEWORDERS,1,"Q0091")==0  AND match(DOCUMENT.NEWORDERS,1,"Gyn")==0 AND match(DOCUMENT.NEWORDERS,1,"88175-97[address]")==0 )  THEN "" ELSE ("") ENDIF + IF NOT (ShowDue("PAP SMEAR","PAP [phone]) == "showdue"  AND match(DOCUMENT.NEWORDERS,1,"88142")==0  AND match(DOCUMENT.NEWORDERS,1,"Q0091")==0  AND match(DOCUMENT.NEWORDERS,1,"Gyn")==0 AND match(DOCUMENT.NEWORDERS,1,"88175-97000")==0 ) THEN "" ELSE ("") ENDIF + IF NOT (( match(DOCUMENT.NEWORDERS,1,"88142")>0  OR match(DOCUMENT.NEWORDERS,1,"88175-97000")>0 OR match(DOCUMENT.NEWORDERS,1,"Q0091")>0  ) AND match(DOCUMENT.NEWORDERS,1,"Gyn")==0) THEN "" ELSE ("") ENDIF + IF NOT (match(DOCUMENT.NEWORDERS,1,"Gyn")>0) THEN "" ELSE ("") ENDIF + if ([phone] == "Yes" AND LASTOBSVALUE("PAPRECACT") <> "") then CFMT(LASTTEST2("PAPRECACT"), "", "  Pap smear action/deferral: ", "B", " ") else ""   endif + IF NOT (not(DOCUMENT.[phone] <> "" and [phone] =="" ) ) THEN "" ELSE ("") ENDIF + IF NOT  ([phone] <> "" and [phone] ==""  ) THEN "" ELSE ("") ENDIF + if (DOCUMENT.I_[id number]_347== "Yes") then CFMT([id number], "", "  Pap smear due: ", "B", " ") else ""   endif) ENDIF + IF NOT (PATIENT.SEX=="M" AND    (   PATIENT._AGEINMONTHS=>"600"   OR (PATIENT._AGEINMONTHS=>"540" AND (PATIENT.RACE=="B"   OR match(DOCUMENT.PROBSAFTER,"ICD-V16.42")>0))   ) ) THEN "" ELSE (IF NOT (TRUE) THEN "" ELSE (if ([phone] == "Yes") then CFMT(LASTTEST2("PSA"), "", "  PSA: ", "B", " ") else ""   endif) ENDIF + IF NOT (ShowDue("PSA","PSADUE",[id number],600,365,1320,[id number] ) <> "noshow"  AND match(DOCUMENT.NEWORDERS,1,"[address]")==0  AND match(DOCUMENT.NEWORDERS,1,"84153-PSA")==0  AND match(DOCUMENT.NEWORDERS,1,"84153-23780")==0 AND [id number]=="Discussed-PSA requested" ) THEN "" ELSE ("") ENDIF + IF NOT (ShowDue("PSA","PSADUE",[id number]_749,600,365,1320,[id number] ) == "showdue"  AND match(DOCUMENT.NEWORDERS,1,"[address]")==0  AND match(DOCUMENT.NEWORDERS,1,"84153-PSA")==0  AND match(DOCUMENT.NEWORDERS,1,"84153-23780")==0 AND [id number]<>"Discussed-PSA requested"  AND LASTOBSVALUE("PSARECACT") <>  "Discussed-PSA declined" AND LASTOBSVALUE("PSARECACT") <>  "Not indicated" ) THEN "" ELSE ("") ENDIF + IF NOT (ShowDue("PSA","PSADUE",[id number]_749,600,365,1320,[id number] ) == "noshowdue"  AND match(DOCUMENT.NEWORDERS,1,"[address]")==0  AND match(DOCUMENT.NEWORDERS,1,"84153-PSA")==0  AND match(DOCUMENT.NEWORDERS,1,"84153-23780")==0 AND [id number]<>"Discussed-PSA requested"  AND LASTOBSVALUE("PSARECACT") <>  "Discussed-PSA declined" ) THEN "" ELSE ("") ENDIF + IF NOT (match(DOCUMENT.NEWORDERS,1,"[address]")>0  OR match(DOCUMENT.NEWORDERS,1,"84153-PSA")>0  OR match(DOCUMENT.NEWORDERS,1,"84153-23780")>0  ) THEN "" ELSE (if  (DOCUMENT.[phone] == "Yes") then FMT("  PSA ordered.", "B") else ""   endif + " ") ENDIF + if (DOCUMENT.[phone] == "Yes" AND LASTOBSVALUE("PSARECACT") <> "") then CFMT(LASTTEST2("PSARECACT"), "", "  PSA action/deferral: ", "B", " ") else ""   endif + IF NOT (not(DOCUMENT.H294456 <> "" and [phone] =="")  ) THEN "" ELSE ("") ENDIF + IF NOT (DOCUMENT.H294456 <> "" and [phone] ==""  ) THEN "" ELSE ("") ENDIF + if (DOCUMENT.I_[id number]_347== "Yes") then CFMT([id number], "", "  PSA due due: ", "B", " ") else ""   endif) ENDIF + IF NOT (PATIENT.SEX=="F" AND PATIENT._AGEINMONTHS=>"480" ) THEN "" ELSE (IF NOT (TRUE) THEN "" ELSE (if (DOCUMENT.[phone] == "Yes") then CFMT(LASTTEST2("MAMMOGRAM"), "", "   Mammogram: ", "B", " ") else ""   endif) ENDIF + IF NOT (ShowDue("MAMMOGRAM","MAMMO [id number])  == "noshowdue" AND match(DOCUMENT.NEWORDERS,1,"Mammo")==0 ) THEN "" ELSE ("") ENDIF + IF NOT (ShowDue("MAMMOGRAM","MAMMO ZM:8331017) == "showdue" AND match(DOCUMENT.NEWORDERS,1,"Mammo")==0 ) THEN "" ELSE ("") ENDIF + IF NOT (match(DOCUMENT.NEWORDERS,1,"Mammo")>0) THEN "" ELSE ("") ENDIF + if ([phone] == "Yes" AND LASTOBSVALUE("MAMMRECACT") <> "") then CFMT(LASTTEST2("MAMMRECACT"), "", "  Mammogram action/deferral: ", "B", " ") else ""   endif + IF NOT (not(DOCUMENT.[phone] <> "" and [phone] =="" ) ) THEN "" ELSE ("") ENDIF + IF NOT (DOCUMENT.[phone] <> "" and [phone] ==""  ) THEN "" ELSE ("") ENDIF + if (DOCUMENT.I_[id number]_347== "Yes") then CFMT([id number], "", "  Mammogram due: ", "B", " ") else ""   endif) ENDIF + IF NOT (PATIENT.SEX=="F" AND PATIENT._AGEINMONTHS>="720" ) THEN "" ELSE (IF NOT (TRUE) THEN  "" ELSE (if (DOCUMENT.[phone] == "Yes") then CFMT(LASTTEST2("BONE DENSITY"), "", "   DXA bone density scan: ", "B", " ") else ""   endif) ENDIF +  IF NOT (ShowDue("BONE DENSITY","DEXANXTDUE",[id number]_155,780,730,1320,[id number]_171) == "noshowdue"  AND match(DOCUMENT.NEWORDERS,1,"Dexa scan")==0) THEN "" ELSE ("") ENDIF + IF NOT (ShowDue("BONE DENSITY","DEXANXTDUE",[id number]_155,780,730,1320,[id number]) == "showdue"  AND match(DOCUMENT.NEWORDERS,1,"Dexa scan")==0) THEN "" ELSE ("") ENDIF + IF NOT (match(DOCUMENT.NEWORDERS,1,"Dexa scan")>0) THEN "" ELSE ("") ENDIF + if (DOCUMENT.[phone] == "Yes" AND LASTOBSVALUE("DEXARECACT") <> "") then CFMT(LASTTEST2("DEXARECACT"), "", "  DXA bone density action/deferral: ", "B", " ") else ""   endif + IF NOT (not(DOCUMENT.[phone] <> "" and [phone] =="" ) ) THEN "" ELSE ("") ENDIF + IF NOT ([phone] <> "" and [id number] ==""  ) THEN "" ELSE ("") ENDIF + if (DOCUMENT.[id number]== "Yes") then CFMT([id number], "", "  DXA scan due: ", "B", "  ") else ""   endif) ENDIF + if match(DOCUMENT.[id number],1,"Request")>0 then FMT(" Reports requested:", "B,1") else ""   endif + " " + IF NOT (match(DOCUMENT.[id number] report of last colonoscopy")>0  ) THEN "" ELSE (if ([phone] == "Yes") then FMT("  Last colonoscopy report requested.", "B") else ""   endif + " ") ENDIF + IF NOT (match(DOCUMENT.[id number] report of last colonoscopy")==0  ) THEN "" ELSE ("") ENDIF + IF NOT (PATIENT.SEX=="F"  AND match(DOCUMENT.[id number] report of last Pap")==0  ) THEN "" ELSE ("") ENDIF + IF NOT (PATIENT.SEX=="F"  AND match(DOCUMENT.[id number] report of last Pap")>0  ) THEN "" ELSE (if (DOCUMENT.[phone] == "Yes") then FMT("   Last Pap report requested.", "B") else ""   endif + " ") ENDIF + IF NOT (PATIENT.SEX=="F"  AND match(DOCUMENT.[id number] report of last mammogram")==0  ) THEN "" ELSE ("") ENDIF + IF NOT (PATIENT.SEX=="F"  AND match(DOCUMENT.[id number] report of last mammogram")>0  ) THEN "" ELSE (if ([phone] == "Yes") then FMT("  Last mammogram report requested.", "B") else ""   endif + " ") ENDIF + IF NOT (PATIENT.SEX=="F"  AND match(DOCUMENT.[id number] report of last DXA")==0  ) THEN "" ELSE ("") ENDIF + IF NOT (PATIENT.SEX=="F"  AND match(DOCUMENT.[id number] report of last DXA")>0  ) THEN "" ELSE (if (DOCUMENT.[phone] == "Yes") then FMT("  Last DXA report requested.", "B") else ""   endif + " ") ENDIF + if (DOCUMENT.[phone] == "Yes") then CFMT(LASTTEST2("SMOK STATUS"), "", " Smoking status: ", "B,1", " ") else ""   endif + IF NOT (LASTOBSVALUE("SMOK STATUS")=="current" OR LASTOBSVALUE("SMOK STATUS")=="Current"  AND [id number]=="") THEN "" ELSE ("") ENDIF + IF NOT ([id number]=="") THEN "" ELSE ("") ENDIF + IF NOT (LASTOBSVALUE("SMOK STATUS")=="current" OR LASTOBSVALUE("SMOK STATUS")=="Current") THEN "" ELSE (if ([phone] == "Yes" AND LASTOBSVALUE("SMOK ADVICE") <> "") then CFMT(LASTTEST2("SMOK ADVICE"), "", "  Smoking cessation counseling: ", "B", " ") else ""   endif) ENDIF + IF NOT (LASTOBSVALUE("SMOK STATUS")=="current" OR LASTOBSVALUE("SMOK STATUS")=="Current") THEN "" ELSE (if (DOCUMENT.[phone] == "Yes" AND LASTOBSVALUE("SMKCSTGQTDT") <> "") then CFMT(LASTTEST2("SMKCSTGQTDT"), "", "  Target quit date: ", "B", " ") else ""   endif) ENDIF + if (DOCUMENT.[id number]== "Yes" OR DOCUMENT.[id number]_316== "Yes"  OR DOCUMENT.INCLUDE__PSA== "Yes")  then CFMT([phone], "", "    Screening comments: ", "B", " ") else ""   endif + IF NOT (match(DOCUMENT.PROBSAFTER,"ICD-250")>0) THEN "" ELSE (if ([phone] == "Yes") then FMT(" Diabetes Mellitus", "B,1") else ""   endif + " ") ENDIF + IF NOT (match(DOCUMENT.PROBSAFTER,"ICD-250")>0) THEN "" ELSE (IF NOT (TRUE) THEN "" ELSE (if (DOCUMENT.[phone] == "Yes") then CFMT(LASTTEST2("HGBA1C"), "", "  HgbA1C: ", "B", " ") else ""   endif) ENDIF + IF NOT (ShowDue("HGBA1C","HGBA1CNXTDUE",[id number],1,90,1320,[id number]) == "noshowdue"   AND match(DOCUMENT.NEWORDERS,1,"83036QW")==0 AND match(DOCUMENT.NEWORDERS,1,"83036")==0 AND match(DOCUMENT.NEWORDERS,1,"83036-A1C")==0 ) THEN "" ELSE ("") ENDIF + IF NOT (ShowDue("HGBA1C","HGBA1CNXTDUE",[id number],1,90,1320,[id number]) == "showdue"   AND match(DOCUMENT.NEWORDERS,1,"83036QW")==0 AND match(DOCUMENT.NEWORDERS,1,"83036")==0 AND match(DOCUMENT.NEWORDERS,1,"83036-A1C")==0 ) THEN ""  ELSE ("") ENDIF + IF NOT (match(DOCUMENT.NEWORDERS,1,"83036QW")>0 OR match(DOCUMENT.NEWORDERS,1,"83036")>0 OR match(DOCUMENT.NEWORDERS,1,"83036-A1C")>0) THEN "" ELSE ("") ENDIF + IF NOT (match(DOCUMENT.NEWORDERS,1,"Glucose, (CBG)")==0  AND match(DOCUMENT.NEWORDERS,1,"Glucose Cap-FMC")==0  AND match(DOCUMENT.NEWORDERS,1,"T- Capillary Blood Glucose")==0 AND match(DOCUMENT.NEWORDERS,1,"T-Capillary Blood Glucose")==0) THEN "" ELSE ("") ENDIF + IF NOT (match(DOCUMENT.NEWORDERS,1,"Glucose, (CBG)")>0  OR match(DOCUMENT.NEWORDERS,1,"Glucose Cap-FMC")>0  OR match(DOCUMENT.NEWORDERS,1,"T- Capillary Blood Glucose")>0 OR match(DOCUMENT.NEWORDERS,1,"T-Capillary Blood Glucose")>0) THEN "" ELSE ("") ENDIF + if (DOCUMENT.[id number] == "Yes" AND LASTOBSVALUE("HGBA1CCACT") <> "") then CFMT(LASTTEST2("HGBA1CCACT"), "", "  HgbA1C action/deferral: ", "B", " ") else ""   endif + IF NOT (not(DOCUMENT.[phone] <> "" and  [id number] == "")  ) THEN "" ELSE ("") ENDIF + IF NOT (DOCUMENT.[phone] <> "" and [phone] == ""  ) THEN "" ELSE ("") ENDIF + if (DOCUMENT.[id number]_410== "Yes") then CFMT([id number], "", "  Hemoglobin A1C due: ", "B", " ") else ""   endif) ENDIF + IF NOT (match(DOCUMENT.PROBSAFTER,"ICD-250")>0) THEN "" ELSE (IF NOT (TRUE) THEN "" ELSE (if (DOCUMENT.[phone] == "Yes") then CFMT(LASTTEST2("DIAB EYE EX"), "", "   Eye exam: ", "B", " ") else ""   endif) ENDIF + IF NOT (match(DOCUMENT.[id number] report of last diabetic eye exam")==0  ) THEN "" ELSE ("") ENDIF + IF NOT (match(DOCUMENT.[id number] report of last diabetic eye exam")>0  ) THEN "" ELSE (if ([phone] == "Yes") then FMT("  Last eye exam report requested.", "B") else ""   endif + " ") ENDIF + IF NOT (match(DOCUMENT.NEWORDERS,1,"Ophthalmology")>0) THEN "" ELSE ("") ENDIF + IF NOT (ShowDue("DIAB EYE EX","DMEYEEXAMNXT",[id number],1,365,1320,[id number]) == "noshowdue"  AND match(DOCUMENT.NEWORDERS,1,"Ophthalmology")==0) THEN "" ELSE ("") ENDIF + IF NOT (ShowDue("DIAB EYE EX","DMEYEEXAMNXT",[id number],1,365,1320,[id number]) == "showdue"  AND match(DOCUMENT.NEWORDERS,1,"Ophthalmology")==0) THEN "" ELSE ("") ENDIF + if ([phone] == "Yes" AND LASTOBSVALUE("DMEYERECACT") <> "") then CFMT(LASTTEST2("DMEYERECACT"), "", "  Diabetic eye exam action/deferral: ", "B", " ") else ""   endif + IF NOT (not([phone] <> "" and [phone] == "" ) ) THEN "" ELSE ("") ENDIF + IF NOT ([phone] <> "" and [phone] == ""  ) THEN "" ELSE ("") ENDIF + if (DOCUMENT.I_[id number]_410== "Yes") then CFMT([id number], "", "  Eye exam due: ", "B", " ") else ""   endif) ENDIF + IF NOT  (match(DOCUMENT.PROBSAFTER,"ICD-250")>0) THEN "" ELSE (IF NOT (TRUE) THEN "" ELSE (if ([phone] == "Yes") then CFMT(LASTTEST2("DIAB FOOT CK"), "", "   Foot exam: ", "B", " ") else ""   endif) ENDIF + IF NOT (not ( ShowDue("DIAB FOOT CK","DB FT EX [phone]) == "showdue"   OR (LASTOBSVALUE("HIGHRISKFT") == "Yes"  AND ShowDue("DIAB FOOT CK","DB FT EX [phone]) == "showdue") ) ) THEN "" ELSE ("") ENDIF + IF NOT (ShowDue("DIAB FOOT CK","DB FT EX [phone]) == "showdue"   OR (LASTOBSVALUE("HIGHRISKFT") == "Yes"  AND ShowDue("DIAB FOOT CK","DB FT EX HQ:[phone]) == "showdue") ) THEN "" ELSE ("") ENDIF + if (DOCUMENT.[phone] == "Yes" AND LASTOBSVALUE("DIABFTRECACT")<>"") then CFMT(LASTOBSVALUE("DIABFTRECACT"), "", "  Foot exam action/deferral: ", "B", " ") else ""   endif + IF NOT (TRUE) THEN "" ELSE (if ([phone] == "Yes") then CFMT(LASTTEST2("HIGHRISKFT"), "", "  High risk foot: ", "B", " ") else ""   endif) ENDIF + IF NOT (DOCUMENT.LOCUSER <> "[facility]" AND ( not ( ShowDue("DIAB FOOT CK","DB FT EX [phone]) == "showdue"   OR (LASTOBSVALUE("HIGHRISKFT") == "Yes"  AND ShowDue("DIAB FOOT CK","DB FT EX [phone]) == "showdue") ) ) ) THEN "" ELSE ("") ENDIF + IF NOT (DOCUMENT.LOCUSER <> "[facility]" AND ( ShowDue("DIAB FOOT CK","DB FT EX [phone]) == "showdue"   OR (LASTOBSVALUE("HIGHRISKFT") == "Yes"  AND ShowDue("DIAB FOOT CK","DB FT EX [phone]) == "  showdue") ) ) THEN "" ELSE ("") ENDIF + IF NOT  ((LASTOBSVALUE("DIAB FOOT CK") == "" AND LASTOBSVALUE("DBT FT EX DT")<>"")   OR  (   LASTOBSVALUE("DIAB FOOT CK") <> "" AND LASTOBSVALUE("DBT FT EX DT")<>"" AND   DURATIONDAYS(str(LASTOBSDATE("DIAB FOOT CK")),str(._TODAYSDATE)) >   DURATIONDAYS(str(LASTOBSVALUE("DBT FT EX DT")),str(._TODAYSDATE)) ) ) THEN "" ELSE ("") ENDIF + IF NOT (TRUE) THEN "" ELSE (if (DOCUMENT.[phone] == "Yes") then CFMT(LASTTEST2("QD FTINSPECT"), "", "  Foot care education: ", "B", " ") else ""   endif) ENDIF + IF NOT ([phone] <> "" and [phone] ==""  ) THEN "" ELSE ("") ENDIF + IF NOT (not(   DOCUMENT.[phone] <> "" and [phone] =="" ) ) THEN "" ELSE ("") ENDIF + if (DOCUMENT.I_[id number]_410== "Yes") then CFMT([id number], "", "  Foot exam due: ", "B", " ") else ""   endif) ENDIF + IF NOT (match(DOCUMENT.PROBSAFTER,"ICD-250")>0) THEN "" ELSE (IF NOT (TRUE) THEN "" ELSE (if (DOCUMENT.[phone] == "Yes") then CFMT(LASTTEST2("MICROALB/CRE"), "", "   Urine microalbumin/creatinine ratio: ", "B", " ") else ""   endif) ENDIF + IF NOT (((USER.CURLOCATIONNAME == "[facility]" AND  ShowDue("MICROALB URN","MICRALB [phone]) == "noshowdue" ) OR  (USER.CURLOCATIONNAME <> "[facility]" AND  ShowDue("MICROALB/CRE","MICRALB [phone]) == "noshowdue" )) AND match(DOCUMENT.NEWORDERS,1,"82043 / [address]")==0  AND match(DOCUMENT.NEWORDERS,1,"82044")==0 AND match(DOCUMENT.NEWORDERS,1,"82043-MALB")==0 ) THEN "" ELSE ("") ENDIF + IF NOT (((USER.CURLOCATIONNAME == "[facility]" AND  ShowDue("MICROALB URN","MICRALB [phone]) == "showdue" ) OR  (USER.CURLOCATIONNAME <> "[facility]" AND  ShowDue("MICROALB/CRE","MICRALB  [phone]) == "showdue" )) AND match(DOCUMENT.NEWORDERS,1,"82043 / [address]")==0  AND match(DOCUMENT.NEWORDERS,1,"82044")==0 AND match(DOCUMENT.NEWORDERS,1,"82043-MALB")==0 ) THEN "" ELSE ("") ENDIF + IF NOT (match(DOCUMENT.NEWORDERS,1,"82043 / [address]")>0  OR match(DOCUMENT.NEWORDERS,1,"82044")>0  OR match(DOCUMENT.NEWORDERS,1,"82043-MALB")>0) THEN "" ELSE ("") ENDIF + if ([phone] == "Yes" AND LASTOBSVALUE("MICALBRECACT")<>"") then CFMT(LASTOBSVALUE("MICALBRECACT"), "", "  Urine microalbumin action/deferral: ", "B", " ") else ""   endif + IF NOT (not([phone] <> "" and [phone] =="")  ) THEN "" ELSE ("") ENDIF + IF NOT (DOCUMENT.[phone] <> "" and [phone] ==""  ) THEN "" ELSE ("") ENDIF + if (DOCUMENT.I_[id number]_410== "Yes") then CFMT([id number], "", "  Urine microalbumin/cr due: ", "B", " ") else ""   endif) ENDIF + IF NOT (match(DOCUMENT.PROBSAFTER,"ICD-250")>0) THEN "" ELSE (IF (STR([id number], [phone]) == "") THEN "" ELSE (if (DOCUMENT.[phone] == "Yes") then CFMT([id number], "", "   Diabetes flowsheet reviewed?: ", "B", " ") else ""   endif) ENDIF + if ([phone] == "Yes") then CFMT([id number], "", "  Progress toward A1C goal: ", "B", " ") else ""   endif + if (DOCUMENT.[phone] == "Yes") then CFMT([id number], "", "   Stage of readiness to change (diabetes management): ", "B", " ") else ""   endif + if ([phone] == "Yes") then CFMT(DOCUMENT.[phone], "", "  Diabetes comments: ", "B", " ") else ""   endif) ENDIF + IF NOT (match(DOCUMENT.PROBSAFTER,"ICD-272")>0   OR PATIENT._AGEINMONTHS=>"89") THEN "" ELSE (if (DOCUMENT.[phone] == "Yes") then FMT(" Lipids", "B,1") else ""   endif +  " " + if (DOCUMENT.[phone] == "Yes") then CFMT(LASTTEST2("CHOLESTEROL"), "", "  Total Cholesterol: ", "B", " ") else ""   endif + IF NOT (( ShowDue("CHOLESTEROL","LIPIDS [phone] ) == "showdue"  OR   (match(PROB_AFTER(),"ICD-250")>0  AND  ShowDue("CHOLESTEROL","LIPIDS [phone] ) == "showdue"  )  OR  (match(PROB_AFTER(),"ICD-272")>0  AND  ShowDue("CHOLESTEROL","LIPIDS [phone] ) == "showdue"  ) )  AND ( match(DOCUMENT.NEWORDERS,1,"80061-22930")==0  AND match(DOCUMENT.NEWORDERS,1,"80061-LIPID")==0 )  ) THEN "" ELSE ("") ENDIF + IF NOT (not ( ( ShowDue("CHOLESTEROL","LIPIDS [phone] ) == "showdue"  OR   (  match(PROB_AFTER(),"ICD-250")>0  AND  ShowDue("CHOLESTEROL","LIPIDS [phone] ) == "showdue"  )  OR  (match(PROB_AFTER(),"ICD-272")>0  AND  ShowDue("CHOLESTEROL","LIPIDS [phone] ) == "showdue"  ) ) ) AND ( match(DOCUMENT.NEWORDERS,1,"80061-22930")==0  AND match(DOCUMENT.NEWORDERS,1,"80061-LIPID")==0 ) ) THEN "" ELSE ("") ENDIF + IF NOT (match(DOCUMENT.NEWORDERS,1,"80061-22930")>0  OR match(DOCUMENT.NEWORDERS,1,"80061-LIPID")>0) THEN "" ELSE ("") ENDIF + if (DOCUMENT.[phone] == "Yes" AND LASTOBSVALUE("CHOLESTRECAC")<>"") then CFMT(LASTOBSVALUE("CHOLESTRECAC"), "", "  Lipid panel action/deferral: ", "B", " ") else ""   endif + if (DOCUMENT.[phone] == "Yes") then CFMT(LASTTEST2("LDL"), "", "  LDL: ", "B", " ") else ""   endif + if (DOCUMENT.[phone] == "Yes") then CFMT(LASTTEST2("LDL DIR"), "", "  LDL Direct: ", "B", " ") else ""   endif + IF NOT (match(DOCUMENT.NEWORDERS,1,"83721-81033")>0  OR  match(DOCUMENT.NEWORDERS,1,"83721-DIRLDL")>0) THEN "" ELSE ("") ENDIF + IF NOT (match(DOCUMENT.NEWORDERS,1,"83721-81033")==0  AND match(DOCUMENT.NEWORDERS,1,"83721-DIRLDL")==0) THEN "" ELSE ("") ENDIF + if (DOCUMENT.[phone] == "Yes") then CFMT(LASTTEST2("HDL"), "", "  HDL: ", "B", " ") else ""   endif + if ([phone] == "Yes") then CFMT(LASTTEST2("TRIGLYC TOT"), "", "  Triglycerides: ", "B", " ") else ""   endif + IF NOT (not([phone] <> "" and [phone] =="")  ) THEN "" ELSE ("") ENDIF + IF NOT (DOCUMENT.[phone] <> "" and [phone] ==""  ) THEN "" ELSE ("") ENDIF + if (DOCUMENT.[id number]_176== "Yes") then CFMT([id number], "", "  Lipid panel due: ", "B", " ") else ""   endif) ENDIF + IF NOT (match(DOCUMENT.PROBSAFTER,"ICD-272")>0) THEN "" ELSE (if ([phone] == "Yes") then CFMT(LASTTEST2("SGOT (AST)"), "", "   SGOT (AST): ", "B", " ") else ""   endif + IF NOT (ShowDue("SGOT (AST)","LIVFUNTDUE",[id number],216,365,1320,[id number] ) <> "showdue"  AND  ( match(DOCUMENT.NEWORDERS,1,"80076-22960")==0  AND match(DOCUMENT.NEWORDERS,1,"80076-HEPATIC")==0 AND match(DOCUMENT.NEWORDERS,1,"80053-22900")==0 AND match(DOCUMENT.NEWORDERS,1,"80053-COMP")==0 ) ) THEN "" ELSE ("") ENDIF + IF NOT (match(DOCUMENT.NEWORDERS,1,"80076-22960")>0  OR match(DOCUMENT.NEWORDERS,1,"80076-HEPATIC")>0) THEN "" ELSE ("") ENDIF + IF NOT (ShowDue("SGOT (AST)","LIVFUNTDUE",[id number],216,365,1320,[id number] ) == "showdue"  AND ( match(DOCUMENT.NEWORDERS,1,"80076-22960")==0  AND match(DOCUMENT.NEWORDERS,1,"80076-HEPATIC")==0 AND match(DOCUMENT.NEWORDERS,1,"80053-22900")==0 AND match(DOCUMENT.NEWORDERS,1,"80053-COMP")==0 )  ) THEN "" ELSE ("") ENDIF + if (DOCUMENT.[phone] == "Yes" AND LASTOBSVALUE("LIVPNLRECACT")<>"") then  CFMT(LASTOBSVALUE("LIVPNLRECACT"), "", "  BMP action: ", "B", " ") else ""   endif + if (DOCUMENT.[phone] == "Yes") then CFMT(LASTTEST2("SGPT (ALT)"), "", "  SGPT (ALT): ", "B", " ") else ""   endif + IF NOT (match(DOCUMENT.NEWORDERS,1,"80053-22900")==0 AND match(DOCUMENT.NEWORDERS,1,"80053-COMP")==0) THEN "" ELSE ("") ENDIF + IF NOT (match(DOCUMENT.NEWORDERS,1,"80053-22900")>0 OR match(DOCUMENT.NEWORDERS,1,"80053-COMP")>0) THEN "" ELSE ("CMP ordered  ") ENDIF + if (DOCUMENT.[phone] == "Yes") then CFMT(LASTTEST2("ALK PHOS"), "", "  Alkaline phosphatase: ", "B", " ") else ""   endif + if ([phone] == "Yes") then CFMT(LASTTEST2("BILI TOTAL"), "", "  Total bilirubin: ", "B", " ") else ""   endif + IF NOT (not (   DOCUMENT.[phone] <> "" and [phone] ==""   ) ) THEN "" ELSE ("") ENDIF + IF NOT ([phone] <> "" and [phone] ==""  ) THEN "" ELSE ("") ENDIF + if (DOCUMENT.[id number]_176== "Yes") then CFMT([id number], "", "  Liver panel due: ", "B", " ") else ""   endif) ENDIF + IF NOT (match(DOCUMENT.PROBSAFTER,"ICD-272")>0) THEN "" ELSE (if (DOCUMENT.[phone] == "Yes") then CFMT([id number], "", "   Lipid flowsheet reviewed?: ", "B", " ") else ""   endif + if (DOCUMENT.[phone] == "Yes") then CFMT([id number], "", "  Progress toward LDL goal: ", "B", " ") else ""   endif + if ([phone] == "Yes") then CFMT([id number], "", "   Stage of readiness to change (lipid management): ", "B", " ") else ""   endif + if (DOCUMENT.[phone] == "Yes") then CFMT(DOCUMENT.[phone], "", "  Lipid comments: ", "  B", " ") else ""   endif) ENDIF + IF NOT (match(DOCUMENT.PROBSAFTER,"ICD-401")>0 OR match(DOCUMENT.PROBSAFTER,"ICD-405")>0 ) THEN "" ELSE (if (DOCUMENT.[phone]  == "Yes") then FMT(" Hypertension", "B,1") else ""   endif + " " + IF (TRUE) THEN "" ELSE ("") ENDIF + IF NOT (TRUE) THEN "" ELSE (cond case DOCUMENT.[phone] == "Yes" and LASTOBSVALUE("BP SYSTOLIC") == "" CFMT(("Not Documented"), "", "  Last Blood Pressure: ", "B", " ") case DOCUMENT.[phone] == "Yes" and LASTOBSVALUE("BP SYSTOLIC") <> "" CFMT((LASTOBSVALUE("BP SYSTOLIC") + " / " + LASTOBSVALUE("BP DIASTOLIC") + "  (" + LASTOBSDATE("BP SYSTOLIC")+ ")"), "", "  Last Blood Pressure: ", "B", " ") else "" endcond) ENDIF + IF NOT (TRUE) THEN "" ELSE (if (DOCUMENT.[phone] == "Yes") then CFMT(LASTTEST2("CREATININE"), "", "  Serum creatinine: ", "B", " ") else ""   endif) ENDIF + IF NOT (ShowDue("CREATININE","BMP [phone]) == "noshowdue"   AND ( match(DOCUMENT.NEWORDERS,1,"80048-22910")==0  AND match(DOCUMENT.NEWORDERS,1,"80048-METABOL")==0 AND match(DOCUMENT.NEWORDERS,1,"80053-22900")==0 AND match(DOCUMENT.NEWORDERS,1,"80053-COMP")==0 ) ) THEN "" ELSE ("") ENDIF + IF NOT (ShowDue("CREATININE","BMP [phone]) == "showdue"   AND  ( match(DOCUMENT.NEWORDERS,1,"80048-22910")==0  AND match(DOCUMENT.NEWORDERS,1,"80048-METABOL")==0 AND match(DOCUMENT.NEWORDERS,1,"80053-22900")==0 AND match(DOCUMENT.NEWORDERS,1,"80053-COMP")==0 ) ) THEN "" ELSE ("") ENDIF + IF NOT (match(DOCUMENT.NEWORDERS,1,"80048-22910")>0  OR match(DOCUMENT.NEWORDERS,1,"80048-METABOL")>0) THEN "" ELSE ("") ENDIF + if ([phone] == "Yes" AND LASTOBSVALUE("BMPRECACT")<>"") then CFMT(LASTOBSVALUE("BMPRECACT"), "", "  BMP action: ", "B", " ") else ""   endif + IF NOT (TRUE) THEN "" ELSE (if (DOCUMENT.[phone] == "Yes") then CFMT(LASTTEST2("K SERUM"), "", "  Serum potassium ", "B", " ") else ""   endif) ENDIF + IF NOT  (match(DOCUMENT.NEWORDERS,1,"80053-22900")>0 OR match(DOCUMENT.NEWORDERS,1,"80053-COMP")>0) THEN "" ELSE ("CMP ordered  ") ENDIF + IF NOT (DOCUMENT.[phone] <> "" and [phone] ==""  ) THEN "" ELSE ("") ENDIF + IF NOT (not(   DOCUMENT.[phone] <> "" and [phone] =="" ) ) THEN "" ELSE ("") ENDIF + if (DOCUMENT.[id number]_332== "Yes") then CFMT([id number], "", "  Basic metabolic panel due: ", "B", " ") else ""   endif + IF (STR([id number], [phone]) == "") THEN "" ELSE (if (DOCUMENT.6087109741 == "Yes") then CFMT([id number], "", "   Hypertension flowsheet reviewed?: ", "B", " ") else ""   endif) ENDIF + if (DOCUMENT.[phone] == "Yes") then CFMT([id number], "", "  Progress toward BP goal: ", "B", " ") else ""   endif + if (DOCUMENT.[phone] == "Yes") then CFMT([id number], "", "   Stage of readiness to change (hypertension management): ", "B", " ") else ""   endif + if (DOCUMENT.[phone] == "Yes") then CFMT(DOCUMENT.[phone], "", "  Hypertension comments: ", "B", " ") else ""   endif) ENDIF + IF NOT (match(DOCUMENT.PROBSAFTER,"ICD-272")>0     OR match(DOCUMENT.PROBSAFTER,"ICD-250")>0    OR match(DOCUMENT.PROBSAFTER,"ICD-401")>0   OR match(DOCUMENT.PROBSAFTER,"ICD-405")>0                                  ) THEN "" ELSE (if (DOCUMENT.[phone] == "Yes") then FMT(" Self-Management Support :", "B,1") else ""   endif + " " + IF (STR(OBSANY("PERSA1CGOAL"), OBSANY("PERSBPGOAL"), OBSANY("PERSLDLGOAL")) == "") THEN "" ELSE (if ([phone] == "Yes") then FMT("  Personal Goals (by the next clinic visit) :", "B") else ""   endif + " " + IF NOT (match(DOCUMENT.PROBSAFTER,"ICD-250")>0) THEN "" ELSE (if (DOCUMENT.[phone] == "Yes" AND LASTOBSVALUE("PERSA1CGOAL") <> "") then  CFMT(LASTTEST2("PERSA1CGOAL"), "", "    Personal A1C goal: ", "B", "") else ""   endif) ENDIF + IF NOT (match(DOCUMENT.PROBSAFTER,"ICD-250")>0  OR match(DOCUMENT.PROBSAFTER,"ICD-401")>0 OR match(DOCUMENT.PROBSAFTER,"ICD-405")>0  ) THEN "" ELSE (if (DOCUMENT.[phone] == "Yes" AND LASTOBSVALUE("PERSBPGOAL") <> "") then  CFMT(LASTTEST2("PERSBPGOAL"), "", "     Personal blood pressure goal: ", "B", "") else ""   endif) ENDIF + IF NOT (match(DOCUMENT.PROBSAFTER,"ICD-250")>0 OR match(DOCUMENT.PROBSAFTER,"ICD-272")>0) THEN "" ELSE (if (DOCUMENT.[phone] == "Yes" AND LASTOBSVALUE("PERSLDLGOAL") <> "") then CFMT(LASTTEST2("PERSLDLGOAL"), "", "     Personal LDL goal: ", "B", " ") else ""   endif) ENDIF) ENDIF + IF (STR(OBSNOW("PTPLANMEDMON"), OBSNOW("PTPLANEATING"), OBSNOW("PTPLANACTIV"), OBSNOW("PTPLANOTHER")) == "") THEN "" ELSE (if (DOCUMENT.[phone] == "Yes") then FMT("   Patient will work on the following items until the next clinic visit to reach self-care goals:", "B") else ""   endif + " " + if ([phone] == "Yes" AND LASTOBSVALUE("PTPLANMEDMON") <> "") then CFMT(LASTTEST2("PTPLANMEDMON"), "", "    Medications and monitoring: ", "B", " ") else ""   endif + if (DOCUMENT.[phone] == "Yes" AND LASTOBSVALUE("PTPLANEATING") <> "") then CFMT(LASTTEST2("PTPLANEATING"), "", "    Eating: ", "B", " ") else ""   endif + if (DOCUMENT.[phone] == "Yes" AND LASTOBSVALUE("PTPLANACTIV") <> "") then CFMT(LASTTEST2("PTPLANACTIV"), "", "    Activity: ", "B", " ") else ""   endif + if ([phone] == "Yes" AND LASTOBSVALUE("PTPLANOTHER") <> "") then CFMT(LASTTEST2("PTPLANOTHER"), "", "    Other: ", "B", " ") else ""   endif) ENDIF + IF NOT (match(DOCUMENT.PROBSAFTER,"ICD-250")>0) THEN "" ELSE (if ([phone] == "Yes" AND LASTOBSVALUE("FREQ HOMEMON") <> "") then CFMT(LASTTEST2("FREQ  HOMEMON"), "", "    Home glucose monitoring frequency: ", "B", " ") else ""   endif) ENDIF) ENDIF + IF NOT (match(DOCUMENT.PROBSAFTER,"ICD-250")>0) THEN "" ELSE (IF NOT (TRUE) THEN "" ELSE (if (DOCUMENT.[phone] == "Yes") then CFMT(LASTTEST2("DMSMSUPP"), "", "   Diabetes self-management support: ", "B", " ") else ""   endif) ENDIF + IF NOT ([phone] ,1,"Written self-care plan")==0  ) THEN "" ELSE ("") ENDIF + IF NOT ([phone] ,1,"Written self-care plan")>0  ) THEN "" ELSE (if (DOCUMENT.[phone] == "Yes") then FMT("  Diabetes care plan printed ", "B") else ""   endif) ENDIF + IF NOT ([phone] ,1,"Education handout")==0) THEN "" ELSE ("") ENDIF + IF NOT ([phone] ,1,"Education handout")>0  ) THEN "" ELSE (if (DOCUMENT.[phone] == "Yes") then FMT("  Diabetes education handout printed", "B") else ""   endif + " ") ENDIF + if (DOCUMENT.[phone] == "Yes" AND LASTOBSVALUE("DMSMSUPPACT") <> "") then CFMT(LASTTEST2("DMSMSUPPACT"), "", "   Diabetes self-management support not done because: ", "B", " ") else ""   endif) ENDIF + IF NOT (match(DOCUMENT.PROBSAFTER,"ICD-250")>0) THEN "" ELSE (IF NOT (TRUE) THEN "" ELSE (if (DOCUMENT.[phone] == "Yes") then CFMT(LASTOBSVALUE("LSTSELFMNGD"), "", "  Last diabetes self-management training by diabetes educator: ", "B", " ") else ""   endif) ENDIF + IF NOT (match(ORDERS_NEW("comma"),1,"Diabetic Clinic Referral")>0) THEN "" ELSE (if ([phone] == "Yes") then FMT("  Referred for diabetes self-mgmt training. ", "B") else ""   endif) ENDIF + IF NOT (not ( (LASTOBSVALUE("LSTSELFMNGD") == "")  OR (LASTOBSVALUE("LSTSELFMNGD") <> "" AND LASTOBSVALUE("HGBA1C") > 7 AND  val(DURATIONDAYS(str(LASTOBSDATE("LSTSELFMNGD")),str(._TODAYSDATE))) >= 365)  ) AND match(ORDERS_NEW("comma"),1,"Diabetic Clinic  Referral")==0 ) THEN "" ELSE ("") ENDIF + IF NOT (( (LASTOBSVALUE("LSTSELFMNGD") == "")  OR (LASTOBSVALUE("LSTSELFMNGD") <> "" AND LASTOBSVALUE("HGBA1C") > 7 AND  val(DURATIONDAYS(str(LASTOBSDATE("LSTSELFMNGD")),str(._TODAYSDATE))) >= 365)  ) AND match(ORDERS_NEW("comma"),1,"Diabetic Clinic Referral")==0 ) THEN "" ELSE ("") ENDIF) ENDIF + IF NOT (match(DOCUMENT.PROBSAFTER,"ICD-401")>0 OR match(DOCUMENT.PROBSAFTER,"ICD-405")>0 OR match(DOCUMENT.PROBSAFTER,"ICD-272")>0   OR match(DOCUMENT.PROBSAFTER,"ICD-250")>0) THEN "" ELSE (IF NOT (TRUE) THEN "" ELSE (if ([phone] == "Yes") then CFMT(LASTOBSVALUE("LSTNUTRITION"), "", "  Last medical nutrition therapy: ", "B", " ") else ""   endif) ENDIF + IF NOT (( ((LASTOBSVALUE("BP SYSTOLIC") > [id number]  OR  LASTOBSVALUE("BP DIASTOLIC") > 90 OR [id number]") > 7 OR LASTOBSVALUE("LDL") > 130  OR LASTOBSVALUE("BMI") > 30) AND LASTOBSVALUE("LSTNUTRITION") == "")  OR (LASTOBSVALUE("LSTNUTRITION") <> "" AND (LASTOBSVALUE("BP SYSTOLIC") > XX123456  OR LASTOBSVALUE("BP DIASTOLIC") > 90 OR XX123456") > 7 OR LASTOBSVALUE("LDL") > 130  OR LASTOBSVALUE("BMI") > 30)  AND  val(DURATIONDAYS(str(LASTOBSDATE("LSTNUTRITION")),str(._TODAYSDATE))) >= 180)  ) AND match(ORDERS_NEW("comma"),1,"Nutrition Referral")==0 ) THEN "" ELSE ("") ENDIF + IF NOT (match(ORDERS_NEW("comma"),1,"Nutrition Referral")>0) THEN "" ELSE (if (DOCUMENT.[phone] == "Yes") then FMT("  Referred.", "B") else ""   endif + " ") ENDIF + IF NOT (not ( ( ((LASTOBSVALUE("BP SYSTOLIC") > XX123456  OR LASTOBSVALUE("BP DIASTOLIC") > 90 OR XX123456") > 7 OR LASTOBSVALUE("LDL") > 130  OR LASTOBSVALUE("BMI") > 30) AND LASTOBSVALUE("LSTNUTRITION") == "")  OR (LASTOBSVALUE("LSTNUTRITION") <> "" AND (LASTOBSVALUE("BP SYSTOLIC") > XX123456  OR LASTOBSVALUE("BP DIASTOLIC") > 90 OR XX123456") > 7 OR LASTOBSVALUE("LDL") > 130  OR  LASTOBSVALUE("BMI") > 30)  AND  val(DURATIONDAYS(str(LASTOBSDATE("LSTNUTRITION")),str(._TODAYSDATE))) >= 90)  ) ) AND match(ORDERS_NEW("comma"),1,"Nutrition Referral")==0 ) THEN "" ELSE ("") ENDIF) ENDIF + IF NOT (match(DOCUMENT.PROBSAFTER,"ICD-401")>0 OR match(DOCUMENT.PROBSAFTER,"ICD-405")>0 ) THEN "" ELSE (IF NOT (TRUE) THEN "" ELSE (if (DOCUMENT.[phone] == "Yes") then CFMT(LASTTEST2("HTNSMSUPP"), "", "   Hypertension self-management support: ", "B", " ") else ""   endif) ENDIF + IF NOT ([phone] ,1,"Written self-care plan")==0) THEN "" ELSE ("") ENDIF + IF NOT ([phone] ,1,"Written self-care plan")>0) THEN "" ELSE (if (DOCUMENT.[phone] == "Yes") then FMT("  Hypertension self-care plan printed.", "B") else ""   endif + " ") ENDIF + IF NOT ([phone] ,1,"Education handout")==0) THEN "" ELSE ("") ENDIF + IF NOT ([phone] ,1,"Education handout")>0) THEN "" ELSE (if (DOCUMENT.[phone] == "Yes") then FMT("  Hypertension education handout printed", "B") else ""   endif + " ") ENDIF + if ([phone] == "Yes" AND LASTOBSVALUE("HTNSMSUPPACT") <> "") then CFMT(LASTTEST2("HTNSMSUPPACT"), "", "   Hypertension self-management support not done because: ", "B", " ") else ""   endif) ENDIF + IF NOT (match(DOCUMENT.PROBSAFTER,"ICD-272")>0     ) THEN "" ELSE (IF NOT (TRUE) THEN "" ELSE (if (DOCUMENT.[phone] == "Yes") then CFMT(LASTTEST2("LIPSMSUPP"), "", "   Lipid self-management support: ", "B", "   ") else ""   endif) ENDIF + IF NOT ([phone] ,1,"Written self-care plan")==0) THEN "" ELSE ("") ENDIF + IF NOT ([phone] ,1,"Written self-care plan")>0) THEN "" ELSE (if (DOCUMENT.[phone] == "Yes") then FMT("Lipid self-care plan printed.", "B") else ""   endif + " ") ENDIF + IF NOT ([phone] ,1,"Education handout")==0) THEN "" ELSE ("") ENDIF + IF NOT ([phone] ,1,"Education handout")>0) THEN "" ELSE (if (DOCUMENT.[phone] == "Yes") then FMT("  Lipid education handout printed", "B") else ""   endif + " ") ENDIF + if (DOCUMENT.[phone] == "Yes" AND LASTOBSVALUE("LIPSMSUPPACT") <> "") then CFMT(LASTTEST2("LIPSMSUPPACT"), "", "   Lipid self-management support not done because: ", "B", " ") else ""   endif) ENDIF + if (DOCUMENT.[phone] == "Yes") then CFMT(DOCUMENT.[id number], "", "   Self-management comments: ", "B", " ") else ""   endif + IF NOT ([phone] ,1,"Resources for patients handout")==0 AND [phone] ,1,"Resources for patients handout")==0 AND [phone] ,1,"Resources for patients handout")==0) THEN "" ELSE ("") ENDIF + IF NOT ([id number] ,1,"Resources for patients handout")>0 OR [phone] ,1,"Resources for patients handout")>0 OR match( [id number] ,1,"Resources for patients handout")>0) THEN "" ELSE (if ([phone] == "Yes") then FMTApple Computer printed.", "B") else ""   endif + " ") ENDIF + CFMT(DOCUMENT.[id number], "", "  Nursing Instructions:" +  HRET, "B,1", " ") <-FUNCTION DEFINITION IS NOT EXECUTABLE

## 2010-03-23 NOTE — Miscellaneous (Signed)
Summary: DISCHARGE SUMMARY-OT SERVICES  DISCHARGE SUMMARY-OT SERVICES   Imported By: Lacy Duverney 10/31/2009 16:35:40  _____________________________________________________________________  External Attachment:    Type:   Image     Comment:   External Document

## 2010-03-27 ENCOUNTER — Encounter: Payer: Self-pay | Admitting: Internal Medicine

## 2010-03-27 ENCOUNTER — Ambulatory Visit (HOSPITAL_COMMUNITY)
Admission: RE | Admit: 2010-03-27 | Discharge: 2010-03-27 | Disposition: A | Discharge status: Home / Self Care | Payer: MEDICARE | Source: Ambulatory Visit | Attending: Internal Medicine | Admitting: Internal Medicine

## 2010-03-27 DIAGNOSIS — E669 Obesity, unspecified: Secondary | ICD-10-CM | POA: Insufficient documentation

## 2010-03-27 DIAGNOSIS — I10 Essential (primary) hypertension: Secondary | ICD-10-CM | POA: Insufficient documentation

## 2010-04-06 ENCOUNTER — Other Ambulatory Visit: Payer: Self-pay | Admitting: *Deleted

## 2010-04-06 ENCOUNTER — Other Ambulatory Visit: Payer: Self-pay | Admitting: Internal Medicine

## 2010-04-06 MED ORDER — FUROSEMIDE 40 MG PO TABS: 40.0000 mg | ORAL_TABLET | Freq: Every day | ORAL | Status: DC

## 2010-04-10 ENCOUNTER — Other Ambulatory Visit: Payer: Self-pay | Admitting: *Deleted

## 2010-04-10 MED ORDER — LISINOPRIL 40 MG PO TABS: ORAL_TABLET | ORAL | Status: DC

## 2010-04-10 MED ORDER — METOPROLOL TARTRATE 50 MG PO TABS: 50.0000 mg | ORAL_TABLET | Freq: Two times a day (BID) | ORAL | Status: DC

## 2010-04-10 MED ORDER — PHENOBARBITAL 97.2 MG PO TABS: ORAL_TABLET | ORAL | Status: DC

## 2010-04-10 MED ORDER — AMLODIPINE BESYLATE 10 MG PO TABS: 10.0000 mg | ORAL_TABLET | Freq: Every day | ORAL | Status: DC

## 2010-04-10 NOTE — Telephone Encounter (Signed)
Med called in

## 2010-04-10 NOTE — Telephone Encounter (Signed)
Seen last month. Was to have app't scheduled -- ? status.  Please call in scripts

## 2010-04-12 MED ORDER — FUROSEMIDE 40 MG PO TABS: 40.0000 mg | ORAL_TABLET | Freq: Every day | ORAL | Status: DC

## 2010-04-12 MED ORDER — SPIRONOLACTONE 50 MG PO TABS: 50.0000 mg | ORAL_TABLET | Freq: Every day | ORAL | Status: DC

## 2010-05-07 ENCOUNTER — Other Ambulatory Visit (HOSPITAL_COMMUNITY): Payer: Self-pay | Admitting: Internal Medicine

## 2010-05-07 DIAGNOSIS — Z1231 Encounter for screening mammogram for malignant neoplasm of breast: Secondary | ICD-10-CM

## 2010-05-10 ENCOUNTER — Other Ambulatory Visit: Payer: Self-pay | Admitting: Internal Medicine

## 2010-05-10 DIAGNOSIS — I10 Essential (primary) hypertension: Secondary | ICD-10-CM

## 2010-05-10 NOTE — Telephone Encounter (Signed)
Re-do

## 2010-05-10 NOTE — Telephone Encounter (Signed)
Please schedule for appointment in 2 weeks for follow-up labs and BP check.

## 2010-05-10 NOTE — Telephone Encounter (Signed)
Message sent to from desk

## 2010-05-10 NOTE — Telephone Encounter (Signed)
This patient needs return visit for BP check and BMET as she was supposed to follow-up with [place] at the end of February.  Can we call her to schedule this?  She may have this medicine Disp #30 with no refills as I want her to be seen.  Thank you!

## 2010-05-11 ENCOUNTER — Other Ambulatory Visit: Payer: Self-pay | Admitting: *Deleted

## 2010-05-14 MED ORDER — PHENOBARBITAL 97.2 MG PO TABS: ORAL_TABLET | ORAL | Status: DC

## 2010-05-14 NOTE — Telephone Encounter (Signed)
Faxed to [facility] on [address].

## 2010-05-30 ENCOUNTER — Ambulatory Visit: Payer: MEDICARE | Admitting: Internal Medicine

## 2010-06-04 ENCOUNTER — Encounter: Payer: Self-pay | Admitting: Internal Medicine

## 2010-06-04 ENCOUNTER — Ambulatory Visit (INDEPENDENT_AMBULATORY_CARE_PROVIDER_SITE_OTHER): Payer: MEDICARE | Admitting: Internal Medicine

## 2010-06-04 DIAGNOSIS — I10 Essential (primary) hypertension: Secondary | ICD-10-CM

## 2010-06-04 DIAGNOSIS — R569 Unspecified convulsions: Secondary | ICD-10-CM

## 2010-06-04 DIAGNOSIS — R609 Edema, unspecified: Secondary | ICD-10-CM

## 2010-06-04 DIAGNOSIS — E785 Hyperlipidemia, unspecified: Secondary | ICD-10-CM

## 2010-06-04 MED ORDER — SPIRONOLACTONE 50 MG PO TABS: 50.0000 mg | ORAL_TABLET | Freq: Every day | ORAL | Status: DC

## 2010-06-04 MED ORDER — PHENOBARBITAL 97.2 MG PO TABS: ORAL_TABLET | ORAL | Status: DC

## 2010-06-04 NOTE — Progress Notes (Signed)
  Subjective:    Patient ID: Sherry [name], female    DOB: 1945/04/24, 65 y.o.   MRN: [id number]  HPI Patient is 65 year old female with past medical history of resistant hypertension and multiple other medical problems related to her weight. Patient is here today for regular followup visit and medication refill. Patient's blood pressure today is still elevated at 188/80. Repeat blood pressure was 164/78. Patient is compliant with her blood pressure medications but hasn't been able to lose weight. Patient states that she's been trying a lot but still does not lose in fact she has gained weight since her last office visit. Patient is motivated to lose weight and has her daughter supporting her.  Patient wants to know about the results of her ultrasound which was done 2 months ago.   Review of Systems  Constitutional: Negative for fever, activity change and appetite change.  HENT: Negative for sore throat.   Respiratory: Negative for cough and shortness of breath.   Cardiovascular: Positive for leg swelling. Negative for chest pain.  Gastrointestinal: Negative for nausea, abdominal pain, diarrhea, constipation and abdominal distention.  Genitourinary: Negative for frequency, hematuria and difficulty urinating.  Neurological: Negative for dizziness and headaches.  Psychiatric/Behavioral: Negative for suicidal ideas and behavioral problems.       Objective:   Physical Exam  Constitutional: She is oriented to person, place, and time. She appears well-developed and well-nourished.  HENT:  Head: Normocephalic and atraumatic.  Eyes: Conjunctivae and EOM are normal. Pupils are equal, round, and reactive to light. No scleral icterus.  Neck: Normal range of motion. Neck supple. No JVD present. No thyromegaly present.  Cardiovascular: Normal rate, regular rhythm, normal heart sounds and intact distal pulses.  Exam reveals no gallop and no friction rub.   No murmur heard. Pulmonary/Chest: Effort  normal and breath sounds normal. No respiratory distress. She has no wheezes. She has no rales.  Abdominal: Soft. Bowel sounds are normal. She exhibits no distension and no mass. There is no tenderness. There is no rebound and no guarding.  Musculoskeletal: Normal range of motion. She exhibits edema. She exhibits no tenderness.  Lymphadenopathy:    She has no cervical adenopathy.  Neurological: She is alert and oriented to person, place, and time.  Psychiatric: She has a normal mood and affect. Her behavior is normal.          Assessment & Plan:

## 2010-06-04 NOTE — Patient Instructions (Signed)
1200 Calorie Diabetic Diet The 1200 calorie diabetic diet limits calories to 1200 each day. Following this diet and making healthy meal choices can help improve overall health. It controls blood sugar levels and can also help lower blood pressure and cholesterol.  SERVING SIZES: Measuring foods and serving sizes helps to make sure you are getting the right amount of food. The list below tells how big or small some common serving sizes are.   1 ounce (oz) . . . . . . . . . . . . . . . . 4 stacked dice  3 oz . . . . . . . . . . . . . . . . . . . . . . Deck of cards   1 teaspoon (tsp) . . . . . . . . . . . . . Tip of little finger   1 tablespoon (Tbsp) . . . . . . . . . . Thumb   2 Tbsp . . . . . . . . . . . . . . . . . . . . . Golf ball   1/2 cup . . . . . . . . . . . . . . . . . . . . Half of a fist   1 cup . . . . . . . . . . . . . . . . . . . . . A fist   GUIDELINES FOR CHOOSING FOODS: The goal of this diet is to eat a variety of foods and limit calories to 1200 each day. This can be done by choosing foods that are low in calories and fat. The diet also suggests eating small amounts of food frequently. Doing this helps control your blood sugar levels, so they do not get too high or too low. Each meal or snack may include a protein food source to help you feel more satisfied. Try to eat about the same amount of food around the same time each day. This includes weekend days, travel days, and days off work. Space your meals about 4-5 hours apart, and add a snack between them, if you wish.  For example, a daily food plan could include breakfast, a morning snack, lunch, dinner, and an evening snack. Healthy meals and snacks have different types of foods, including whole grains, vegetables, fruits, lean meats, poultry, fish, and dairy products. As you plan your meals, select a variety of foods. Choose from the bread and starch, vegetable, fruit, dairy, and meat/protein groups. Examples of foods from  each group are listed below, with their suggested serving sizes. Use measuring cups and spoons to become familiar with what a healthy portion looks like. Bread and Starch  Each serving equals 15 grams of carbohydrate 1 slice bread  1/4 bagel  3/4 cup cold cereal (unsweetened)  1/2 cup hot cereal or mashed potatoes  1 small potato (size of a computer mouse) 1/3 cup cooked pasta or rice 1/2 English muffin 1 cup broth based soup 3 cups of popcorn 4-6 whole wheat crackers 1/2 cup cooked beans, peas, or corn Vegetables Each serving equals 5 grams of carbohydrate 1/2 cup cooked vegetables 1 cup raw vegetables 1/2 cup tomato or vegetable juice Fruit  Each serving equals 15 grams of carbohydrate  1 small apple or orange  1-1/4 cup watermelon or strawberries  1/2 cup applesauce (no sugar added)  2 Tbsp raisins  1/2 banana 1/2 cup canned fruit, packed in water or in its own   juice 1/2 cup unsweetened fruit juice Dairy  Each serving equals 12-15 grams of carbohydrate  1 cup fat free milk 6 oz artificially sweetened yogurt or plain yogurt 1 cup low fat buttermilk 1 cup soy milk 1 cup almond milk Meat/Protein 1 large egg 2-3 oz meat, poultry, or fish 1/4 cup low fat cottage cheese 1 Tbsp peanut butter 1 oz low fat cheese 1/4 cup tuna, packed in water 1/2 cup tofu Fat 1 tsp oil 1 tsp trans fat free margarine 1 tsp butter 1 tsp mayonnaise 2 Tbsp avocado 1 Tbsp salad dressing 1 Tbsp cream cheese 2 Tbsp sour cream SAMPLE 1200 CALORIE DIET PLAN: Breakfast:  1 cup fat free milk (1 carb choice)   1 small orange (1 carb choice)   1 scrambled egg   1 slice whole wheat toast (1 carb choice)  Lunch:  Sandwich   2 slices whole wheat bread (2 carb choices)   2 oz lean meat   2 tsp reduced fat mayonnaise   1 lettuce leaf   2 slices tomato   1 cup carrot sticks  Afternoon Snack:  1 small apple (1 carb choice)   1 string cheese  Dinner:  2 oz meat   1 small  baked potato (1 carb choice)   1 tsp trans fat free margarine   1 cup steamed broccoli   1 cup fat free milk (1 carb choice)  Evening Snack:  1/2 small banana (1 carb choice)   6 vanilla wafers (1 carb choice)  Meal Plan You can use this worksheet to help you make a daily meal plan based on the 1200 calorie diabetic diet suggestions. If you are using this plan to help you control your blood glucose, you may interchange carbohydrate containing foods (dairy, starches, and fruits). Select a variety of fresh foods of varying colors and flavors. The total amount of carbohydrate in your meals or snacks is more important than making sure you include all of the food groups every time you eat. You can choose from approximately this many of the following foods to build your day's meals:  6 Starches.  3 Vegetables.   2 Fruits.  2 Dairy.   4-6 oz Meat/Protein.  Up to 3 Fats.   Your dietician can use this worksheet to help you decide how many servings and which types of foods are right for you. Breakfast  Food Group and Servings Food Choice Starch _________________________________________________________ Dairy __________________________________________________________ Fruit ___________________________________________________________ Meat/Protein_____________________________________________________ Fat ____________________________________________________________ Lunch  Food Group and Servings Food Choice  Starch _________________________________________________________ Meat/Protein ____________________________________________________ Vegetables ______________________________________________________ Fruit ___________________________________________________________ Dairy __________________________________________________________ Fat ____________________________________________________________ Afternoon Snack Food Group and Servings Food Choice Dairy  __________________________________________________________ Sherry [name] ___________________________________________________________ Starch __________________________________________________________ Meat/Protein_____________________________________________________ Sherry [name] Food Group and Servings Food Choice Starch _________________________________________________________ Meat/Protein ____________________________________________________ Dairy __________________________________________________________ Vegetables ______________________________________________________ Fruit ___________________________________________________________ Fat ____________________________________________________________ Evening Snack Food Group and Servings Food Choice Fruit ___________________________________________________________ Meat/Protein ____________________________________________________ Dairy __________________________________________________________ Starch __________________________________________________________ Daily Totals Starches _________________________ Vegetables _________________________ Fruits _____________________________ Dairy _____________________________ Meat/Protein________________________ Fats _______________________________  Document Released: 08/27/2004 Document Re-Released: 07/25/2009 Exit[employer] Patient Information 2011 [place].   See me back in 2 weeks with diary of BP readings.

## 2010-06-04 NOTE — Assessment & Plan Note (Signed)
Edema is much improved as per patient. I would not change any medications today.

## 2010-06-04 NOTE — Assessment & Plan Note (Signed)
The patient is on phenobarbital. Compliant with her medications. I refilled phenobarbital today.

## 2010-06-04 NOTE — Assessment & Plan Note (Signed)
Patient's blood pressure is elevated at 188/80 but repeat blood pressure at 164/78. Patient has had workup in the past for resistant hypertension with a recent renal artery ultrasound negative for stenosis. Patient is on 5 blood pressure medications currently. Have encouraged the daughter to check blood pressure at least once a day and maintain a diary. Encouraged the patient today to buy a weighing scale and monitor daily weights. I will follow her back in 2 weeks with a blood pressure diary and with a new diet plan.

## 2010-06-05 NOTE — Assessment & Plan Note (Signed)
Check lipid profile today. 

## 2010-06-11 ENCOUNTER — Ambulatory Visit (HOSPITAL_COMMUNITY): Payer: MEDICARE

## 2010-06-25 ENCOUNTER — Ambulatory Visit (HOSPITAL_COMMUNITY)
Admission: RE | Admit: 2010-06-25 | Discharge: 2010-06-25 | Disposition: A | Discharge status: Home / Self Care | Payer: MEDICARE | Source: Ambulatory Visit | Attending: Internal Medicine | Admitting: Internal Medicine

## 2010-06-25 DIAGNOSIS — Z1231 Encounter for screening mammogram for malignant neoplasm of breast: Secondary | ICD-10-CM | POA: Insufficient documentation

## 2010-07-06 NOTE — Consult Note (Signed)
[place]  Patient:    Sherry [name] Visit Number: [id number] MRN: [id number]          Service Type: MED Location: [id number] Attending Physician:  De Burrs Dictated by:   Alyson Locket. Love, M.D. Admit Date:  03/18/2001                            Consultation Report  PATIENT NAME:  Sherry [name].  PATIENT ADDRESS:  [address], [place], [place]  [address].  DATE OF BIRTH:  Feb 09, 1946.  INDICATIONS:  Sherry [name] is a 65 year old right-handed black married female from [place], [place]., seen in consultation at the request of the internal medicine service for evaluation of a history of suspected seizures.  HISTORY OF PRESENT ILLNESS:  Sherry [name] began having suspected seizures in 16 when she was living in [place], [place].  Initially she states her husband would describe episodes when she was asleep of jerking of her extremities suggestive of tonic-clonic grand mal seizures.  She was treated with Dilantin but some time after 1994 was changed to phenobarbital.  She states some time in the past she stopped having "seizures" and began having what she calls blackouts.  There is an aura of nausea before these occur.  She will then have altered consciousness.  The exact time period of the last is not clear but she will have them as frequently as two times per week.  She is currently being treated with phenobarbital 200 mg per day and has had an EEG in June 2001 which showed evidence of diffuse and right posterior temporal occipital slowing without associated epileptic form activity.  Her current medications include phenobarbital 200 mg q.h.s., and antihypertensive medicines, and she is admitted to [facility] because of elevated blood pressures noted in the clinic.  She also has a known past history of suspected coronary artery disease and had severe left ventricular hypertrophy with cardiac catheterization in  August 1992.  She has had atypical chest pain, obesity, hyperlipidemia, and a history of tension headaches.  Her examination revealed a well-developed obese female with a blood pressure of 220/90 with a large cuff in the right and left arm and a heart rate of 76 without bruits.  She was alert and oriented x 3.  Her cranial nerve examination revealed that visual fields were full.  The disks were flat. Spontaneous venous pulsations were seen.  The extraocular movements were full and corneals were present.  The patients sensation was equal.  There was no facial motor asymmetry.  Hearing was present.  Tongue was midline.  The uvula was midline.  Gags were present.  Sternocleidomastoid, trapezius testing were normal.  Motor examination revealed good strength in the upper and lower extremities.  Fair finger-to-nose.  Fair heel-to-shin.  Sensory examination: ______ to pinprick, touch, position, and vibration.  Deep tendon reflexes 1-2+ and plantar responses downgoing.  Gait examination was not obtained.  IMPRESSION: 1. Complex partial seizures by history, code 345.40. 2. 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  PLAN:  The plan at this time is to obtain an EEG for further evaluation.  I do not think she will be able to afford medications that are used instead of phenobarbital or complex partial seizures other than the possibility of carbamazepine which I do not think she has been on.  An EEG will be obtained for further evaluation. Dictated by:   Jeneen Rinks  Chelsea Primus, M.D. Attending Physician:  De Burrs DD:  03/18/01 TD:  03/19/01 Job: [id number]

## 2010-07-06 NOTE — Discharge Summary (Signed)
[place]. [facility]  Patient:    Sherry [name], Sherry [name] Visit Number: [id number] MRN: [id number]          Service Type: MED Location: [id number] Attending Physician:  Sherry [name] Dictated by:   Sherry [name], M.D. Admit Date:  03/18/2001 Discharge Date: 03/20/2001   CC:         Sherry [name], M.D.  Sherry [name], M.D.   Discharge Summary  DATE OF BIRTH:  02-11-1946.  DISCHARGE DIAGNOSES: 1. Hypertension urgency. 2. Coronary artery disease. 3. Idiopathic seizure disorder. 4. History of atypical chest pain. 5. Obesity. 6. Hyperlipidemia.  DISCHARGE MEDICATIONS: 1. Phenobarbital 100 mg p.o. q.a.m., 200 mg p.o. q.h.s. 2. Lasix 40 mg p.o. q.d. 3. Lopressor 25 mg p.o. q.d. 4. K-Dur 20 mEq p.o. q.d. 5. Aspirin 325 mg p.o. q.d.  FOLLOW UP:  Follow up is with Sherry [name] in the [name] on Wednesday March 23, 2001.  BRIEF ADMISSION NOTE:  The patient is seen in [name] by Dr. Marva [name] and was shown to have a blood pressure 239/120 with repeat blood pressure on the left side being 216/120 and right side pressure 240/126.  The patient has a history of uncontrolled hypertension secondary to noncompliance secondary to financial disability. She also has idiopathic seizure disorder for which she was last seen by Dr. Rosiland [name] of [facility] back in 2001. At this time she presented with mild bilateral temporal region headache.  She states this headache has been there two to there times a week, however, it was alleviated with over-the-counter medications primarily Aleve.  However, the morning of admission the patient woke up and had some visual blurring.  She denied any fevers, chills, constipation, diarrhea, dizziness, nausea or vomiting.  FAMILY HISTORY:  Mother is still alive with cardiomegaly, no heard disease, however, has severe arthritis.  Father has died, prostate cancer but denies any heart disease in father.   Has two children who are in good health.  SOCIAL HISTORY:  No tobacco, alcohol or intravenous drug abuse at this time, however, the patient did have a history of tobacco use, one-half pack per day for 10 years, quit one year ago.  Patient is disabled secondary to seizure disorder. Husband lives with the patient, is a Nature conservation officer and children are out of the house.  REVIEW OF SYSTEMS:  Positive for orthopnea and positive paroxysmal nocturnal dyspnea, positive dyspnea on exertion, no cough, fevers, chills.  Has edema in the lower extremities, 1+.  Headaches usually last a few hours, are relieved with over-the-counter Aleve. No dysuria or painful urination.  PHYSICAL EXAMINATION:  VITAL SIGNS: Patient is afebrile with blood pressures as per history of present illness. Heart rate 90.  Weight 241.  GENERAL:  Generally speaking in no acute distress, mild complaints of chest pain but resolved when I had seen the patient.  HEENT:  Funduscopic examination shows copper wire changes, however, no AV nicking and no papilledema present.  NECK:  Neck is soft, supple with no JVP.  CARDIOVASCULAR: Regular rate and rhythm with 3/6 systolic ejection murmur best heard at the right second intercostal space.  No third heart sounds were auscultated.  LUNGS:  Clear with good air movement.  ABDOMEN:  Unremarkable with good bowel sounds.  EXTREMITIES:  1+ pitting edema and 2+ pulses.  NEUROLOGICAL:  Sensation is intact. No gross focal deficits.  LABORATORY DATA:  ELECTROCARDIOGRAM:  No acute changes, significant T wave inversions were present in leads I, precordial leads IV,  V and VI.  The patient does have electrical events of left atrial enlargement as well as left ventricular hypertrophy but was normal sinus rhythm.  CBC:  White blood cell count 5.2, hemoglobin 15.1, hematocrit 44.2, platelet count 239K.  ELECTROLYTES:  Sodium 142, potassium low at 2.9, chloride 107, bicarb 24,  BUN 16, creatinine 1.2, glucose 98, calcium 8.5, AST and ALT were both normal at 26 and 22. Alkaline phosphatase 79. Total bilirubin of 0.6, total protein 7, albumin 3.7.  HOSPITAL COURSE: #1 - HYPERTENSION URGENCY:  She had no signs of inner organ failure.  We were going to start the patient back on her home regimen of antihypertensives as well as start a Nitro drip to control her pressures down to 160 to 180. However, with the administration of her beta blocker her pressures dropped significantly down to mid [id number] systolic.  Therefore, the patient never needed intravenous nitrates.  This is consistent with the fact that she is most likely not compliant secondary to financial concerns.  Throughout her hospital course her pressures remained stable.  #2 - QUESTIONABLE NEW ONSET MURMUR:  A 2D echocardiogram performed showed no abnormalities.  Ejection fraction of 55 to 65%.  Her aortic valve area was 1.4 cm.  Mild mitral valve regurgitation and left atrium was mildly dilated. There was a trivial pericardial effusion posterior to the heart also on the 2D echocardiogram.  #3 - BLACKOUT SPELLS:  The patient had been complaining that she has constantly been having black out spells, could not corroborate this with patients family members.  However, Dr. Marva [name] did briefly interview the husband who was there  for his wife in [name].  Husband says this is a chronic condition and going over old [name] charts she had been previously seen by Dr. Rosiland [name] in 2001 for similar concerns.  At that time electroencephalogram showed slow diffuse pattern as well as some focal slowing.  Repeat electroencephalogram seen by Dr. Erling [name] here as a consultation were interpreted as normal.  His recommendation was to increase her dose of phenobarbital to 100 mg in the morning and 200 mg at night.  We appreciate Dr. Bernardo [name] help with  her antiepileptic medications.  Otherwise the patient had no other events during the  hospital stay.  The patient is now being discharged with materials given for financial assistance concerning her medications.  I have written for two sets of prescriptions so she may have some medications in the short term and then once her paper work clears she can have long term help with obtaining her medications.  ACTIVITY:  No restrictions.  DIET:  Low salt, low fat diet.  WOUND CARE:  Not applicable.  FOLLOW UP:  Patient has a follow up appointment with Dr. Marva [name] on March 23, 2001 at 1:45 p.m. in the [facility] [name]. Dictated by:   Sherry [name], M.D. Attending Physician:  Sherry [name] DD:  03/20/01 TD:  03/21/01 Job: [id number]

## 2010-07-17 ENCOUNTER — Other Ambulatory Visit: Payer: Self-pay | Admitting: *Deleted

## 2010-07-18 MED ORDER — PHENOBARBITAL 97.2 MG PO TABS: ORAL_TABLET | ORAL | Status: DC

## 2010-07-18 NOTE — Telephone Encounter (Signed)
Called to pharm 

## 2010-07-23 ENCOUNTER — Encounter: Payer: MEDICARE | Admitting: Internal Medicine

## 2010-07-31 ENCOUNTER — Encounter: Payer: Self-pay | Admitting: Internal Medicine

## 2010-07-31 ENCOUNTER — Ambulatory Visit (INDEPENDENT_AMBULATORY_CARE_PROVIDER_SITE_OTHER): Payer: Medicare Other | Admitting: Internal Medicine

## 2010-07-31 VITALS — BP 158/53 | HR 53 | Temp 98.0°F | Ht 61.0 in | Wt 239.3 lb

## 2010-07-31 DIAGNOSIS — I10 Essential (primary) hypertension: Secondary | ICD-10-CM

## 2010-07-31 DIAGNOSIS — R269 Unspecified abnormalities of gait and mobility: Secondary | ICD-10-CM

## 2010-07-31 DIAGNOSIS — R569 Unspecified convulsions: Secondary | ICD-10-CM

## 2010-07-31 DIAGNOSIS — R609 Edema, unspecified: Secondary | ICD-10-CM

## 2010-07-31 DIAGNOSIS — E669 Obesity, unspecified: Secondary | ICD-10-CM

## 2010-07-31 MED ORDER — FUROSEMIDE 40 MG PO TABS: 80.0000 mg | ORAL_TABLET | ORAL | Status: DC

## 2010-07-31 MED ORDER — LISINOPRIL 40 MG PO TABS: 40.0000 mg | ORAL_TABLET | Freq: Every day | ORAL | Status: DC

## 2010-07-31 NOTE — Assessment & Plan Note (Signed)
Though she does not have the same equipment as they have at physical therapy the patient states that she's been doing her physical therapy exercises. She's been able to maintain much of the progress that she made in her gait mobility and flexibility and strength that she gained after undergoing physical therapy.  I encouraged her to continue to work on these exercises.

## 2010-07-31 NOTE — Assessment & Plan Note (Signed)
We can see how this does with the increase in the Lasix dose from 40 mg to 80 mg. She will follow up with [place] in 2-3 weeks.

## 2010-07-31 NOTE — Patient Instructions (Signed)
Take 1 tablet of lisinopril daily. Take 2 tablets of lasix every morning. Continue to check your blood pressure 3-4 times a week and bring those results to the clinic. You have been given information about the My Plate Planner - try reviewing your portions and try eating some more variety using the suggestions in the booklet.  If you have any questions, please bring them with you to your next visit. You will have bloodwork today - if there are any emergency results, I will phone you.  Otherwise, you may go over your results with the doctor when you come back in 2-3 weeks for another blood pressure check.

## 2010-07-31 NOTE — Assessment & Plan Note (Addendum)
We spent a great deal of time today (>30 minutes) on education about healthy eating and the goals of the patient's weight loss. I provided her with and explained to her in detail the My Plate Program where the focus is not only on portion control but also on introducing variety into the diet. I encouraged her to try to limit her portion sizes of calorie-rich foods and liberalize her portions of nutrient-rich but calorie-poor foods.  She and her daughter both seem very motivated to try to promote weight loss. There is a large education gap between what they understand about nutrition and what his reality. However I am hopeful that with consistent counseling, combined with the fact that the patient understands any changes she will make to her diet her one she should consider to be lifetime changes, the patient should be able to make a successful reduction in her overall weight. At followup visit it may be very useful to reinforce to the patient that by losing as little as 5% of her current body weight (and sustaining this loss), she can make a clinically significant improvement in her health.

## 2010-07-31 NOTE — Assessment & Plan Note (Signed)
Patient and daughter do not report any seizure activity. No change in this medication as needed.

## 2010-07-31 NOTE — Progress Notes (Signed)
  Subjective:    Patient ID: Sherry [name], female    DOB: 1945-08-20, 65 y.o.   MRN: [id number]  HPI Comments: Patient is a very pleasant 65 year old woman with a past medical history of hypertension and seizure disorder who presents for regular checkup. She states that she's been doing quite well since we last saw her in April. She denies any systemic complaints. She continues to do her physical therapy exercises for her legs stiffness and weakness. She states she is able to get around well in her house using a walker with wheels. She's been trying to work on losing weight and she describes her diet as such: three meals a day of lettuce, low-fat ranch dressing, [place] meat, and cheddar cheese.  She is a bit frustrated that she has not been able to lose weight with this regimen.  Her daughter accompanies her today, as she usually does. She brings with her a record of the blood pressures she is taken from her mother for the last 6 weeks. It appears that the average systolic blood pressure is around 155.     Review of Systems  Constitutional: Negative for fever and chills.  HENT: Negative for postnasal drip.   Eyes: Negative for visual disturbance.  Respiratory: Negative for cough, chest tightness and shortness of breath.   Cardiovascular: Positive for leg swelling. Negative for chest pain and palpitations.  Gastrointestinal: Negative for abdominal distention.  Genitourinary: Negative for dysuria and vaginal bleeding.  Musculoskeletal: Positive for arthralgias and gait problem.  Neurological: Positive for weakness. Negative for dizziness, seizures and numbness.  Psychiatric/Behavioral: Negative for behavioral problems and dysphoric mood.       Objective:   Physical Exam  Constitutional: She is oriented to person, place, and time. No distress.  HENT:  Head: Normocephalic and atraumatic.  Mouth/Throat: Oropharynx is clear and moist. No oropharyngeal exudate.  Eyes: Conjunctivae and EOM  are normal. Pupils are equal, round, and reactive to light.  Neck: Normal range of motion. Neck supple. No thyromegaly present.  Cardiovascular: Normal rate, regular rhythm and intact distal pulses.   Murmur heard. Pulmonary/Chest: Effort normal and breath sounds normal. She has no wheezes. She has no rales.  Abdominal: Soft. Bowel sounds are normal. She exhibits no distension and no mass. There is no tenderness. There is no rebound and no guarding.  Musculoskeletal: Normal range of motion.       Trace, slightly tender pitting edema bilaterally to mid shin  Lymphadenopathy:    She has no cervical adenopathy.  Neurological: She is alert and oriented to person, place, and time. No cranial nerve deficit. Coordination normal.  Skin: Skin is warm and dry. No rash noted.  Psychiatric: She has a normal mood and affect. Her behavior is normal. Judgment and thought content normal.          Assessment & Plan:

## 2010-07-31 NOTE — Assessment & Plan Note (Signed)
The patient appears to be mildly hypertensive despite an aggressive blood pressure regimen. I discussed this case with Dr. Stann Mainland today. Given that she continues to have some leg edema appears that we will likely be able to move up in her diuretic dose. I will change her Lasix to 80 mg once a day. Also I will decrease her lisinopril to 40 mg a day as 80 mg a day is not likely to help her blood pressure much as compared to 40.  Today I will check a basic metabolic panel and again in 2-3 weeks when she presents for followup.  We will be working on weight loss as well please see below problem obesity.

## 2010-08-01 LAB — BASIC METABOLIC PANEL
BUN: 40 mg/dL — ABNORMAL HIGH (ref 6–23)
CO2: 24 mEq/L (ref 19–32)
Calcium: 9.9 mg/dL (ref 8.4–10.5)
Creat: 1.36 mg/dL — ABNORMAL HIGH (ref 0.50–1.10)

## 2010-12-13 ENCOUNTER — Emergency Department (HOSPITAL_COMMUNITY)
Admission: EM | Admit: 2010-12-13 | Discharge: 2010-12-13 | Disposition: A | Discharge status: Home / Self Care | Payer: Medicare Other | Attending: Emergency Medicine | Admitting: Emergency Medicine

## 2010-12-13 DIAGNOSIS — K625 Hemorrhage of anus and rectum: Secondary | ICD-10-CM | POA: Insufficient documentation

## 2010-12-13 DIAGNOSIS — I10 Essential (primary) hypertension: Secondary | ICD-10-CM | POA: Insufficient documentation

## 2010-12-13 DIAGNOSIS — G40909 Epilepsy, unspecified, not intractable, without status epilepticus: Secondary | ICD-10-CM | POA: Insufficient documentation

## 2010-12-13 DIAGNOSIS — K644 Residual hemorrhoidal skin tags: Secondary | ICD-10-CM | POA: Insufficient documentation

## 2010-12-13 LAB — URINE MICROSCOPIC-ADD ON

## 2010-12-13 LAB — URINALYSIS, ROUTINE W REFLEX MICROSCOPIC
Glucose, UA: NEGATIVE mg/dL
Protein, ur: NEGATIVE mg/dL

## 2011-04-02 ENCOUNTER — Other Ambulatory Visit: Payer: Self-pay | Admitting: Internal Medicine

## 2011-04-15 ENCOUNTER — Other Ambulatory Visit: Payer: Self-pay | Admitting: *Deleted

## 2011-04-15 NOTE — Telephone Encounter (Signed)
Appt has been scheduled 04/22/11 w/Dr Leonia Reeves.

## 2011-04-22 ENCOUNTER — Ambulatory Visit (INDEPENDENT_AMBULATORY_CARE_PROVIDER_SITE_OTHER): Payer: Medicare Other | Admitting: Internal Medicine

## 2011-04-22 ENCOUNTER — Encounter: Payer: Self-pay | Admitting: Internal Medicine

## 2011-04-22 VITALS — BP 168/73 | HR 67 | Temp 97.6°F | Ht 61.0 in | Wt 237.2 lb

## 2011-04-22 DIAGNOSIS — E669 Obesity, unspecified: Secondary | ICD-10-CM

## 2011-04-22 DIAGNOSIS — I10 Essential (primary) hypertension: Secondary | ICD-10-CM

## 2011-04-22 DIAGNOSIS — E785 Hyperlipidemia, unspecified: Secondary | ICD-10-CM

## 2011-04-22 DIAGNOSIS — Z23 Encounter for immunization: Secondary | ICD-10-CM

## 2011-04-22 DIAGNOSIS — R569 Unspecified convulsions: Secondary | ICD-10-CM

## 2011-04-22 DIAGNOSIS — N189 Chronic kidney disease, unspecified: Secondary | ICD-10-CM

## 2011-04-22 LAB — CBC WITH DIFFERENTIAL/PLATELET
Basophils Absolute: 0 10*3/uL (ref 0.0–0.1)
Basophils Relative: 1 % (ref 0–1)
Eosinophils Relative: 5 % (ref 0–5)
Hemoglobin: 12.3 g/dL (ref 12.0–15.0)
MCH: 28.5 pg (ref 26.0–34.0)
MCHC: 32.5 g/dL (ref 30.0–36.0)
MCV: 87.7 fL (ref 78.0–100.0)
Neutro Abs: 1.8 10*3/uL (ref 1.7–7.7)
Neutrophils Relative %: 42 % — ABNORMAL LOW (ref 43–77)
Platelets: 229 10*3/uL (ref 150–400)
RDW: 13.5 % (ref 11.5–15.5)

## 2011-04-22 MED ORDER — SPIRONOLACTONE 50 MG PO TABS: 50.0000 mg | ORAL_TABLET | Freq: Every day | ORAL | Status: DC

## 2011-04-22 MED ORDER — FUROSEMIDE 40 MG PO TABS: 80.0000 mg | ORAL_TABLET | ORAL | Status: DC

## 2011-04-22 MED ORDER — AMLODIPINE BESYLATE 10 MG PO TABS: 10.0000 mg | ORAL_TABLET | Freq: Every day | ORAL | Status: DC

## 2011-04-22 MED ORDER — PHENOBARBITAL 97.2 MG PO TABS: ORAL_TABLET | ORAL | Status: DC

## 2011-04-22 MED ORDER — LISINOPRIL 40 MG PO TABS: 40.0000 mg | ORAL_TABLET | Freq: Every day | ORAL | Status: DC

## 2011-04-22 NOTE — Patient Instructions (Signed)
Please schedule a follow up appointment in 3 months . Please bring your medication bottles with your next appointment. Please take your medicines as prescribed. I will call you with your lab results if anything will be abnormal.

## 2011-04-22 NOTE — Progress Notes (Signed)
  Subjective:    Patient ID: Sherry [name], female    DOB: 1945/05/05, 66 y.o.   MRN: [id number]  HPI 66 year old woman with past medical history significant for hypertension, seizure disorder comes to the clinic for followup visit almost up to 6-8 months.  She states that she has been running out of her seizure medications for last 2 days. She states that she felt sick on her stomach that is typical aura for her seizure  but fortunately did not have seizure.  Otherwise denies any headaches, blurry vision, dizziness, chest pain, palpitations or shortness of breath.    Review of Systems  Constitutional: Negative for fever.  HENT: Negative for congestion, rhinorrhea and postnasal drip.   Eyes: Negative for visual disturbance.  Respiratory: Negative for cough, choking, chest tightness and shortness of breath.   Cardiovascular: Negative for chest pain, palpitations and leg swelling.  Musculoskeletal: Negative for arthralgias.  Neurological: Negative for dizziness and numbness.  Hematological: Negative for adenopathy.  Psychiatric/Behavioral: Negative for agitation.       Objective:   Physical Exam  Constitutional: She is oriented to person, place, and time. She appears well-developed and well-nourished. No distress.  HENT:  Head: Normocephalic and atraumatic.  Mouth/Throat: No oropharyngeal exudate.  Eyes: Conjunctivae and EOM are normal. Pupils are equal, round, and reactive to light.  Neck: Normal range of motion. Neck supple. No JVD present. No tracheal deviation present. No thyromegaly present.  Cardiovascular: Normal rate, regular rhythm, normal heart sounds and intact distal pulses.  Exam reveals no gallop and no friction rub.   No murmur heard. Pulmonary/Chest: Effort normal and breath sounds normal. No stridor. No respiratory distress. She has no wheezes. She has no rales.  Abdominal: Soft. Bowel sounds are normal. She exhibits no distension. There is no tenderness. There is  no rebound.  Musculoskeletal: Normal range of motion. She exhibits edema. She exhibits no tenderness.       Trace pedal edema  Lymphadenopathy:    She has no cervical adenopathy.  Neurological: She is alert and oriented to person, place, and time. She has normal reflexes. No cranial nerve deficit. Coordination normal.  Skin: She is not diaphoretic.          Assessment & Plan:

## 2011-04-23 LAB — COMPLETE METABOLIC PANEL WITH GFR
AST: 25 U/L (ref 0–37)
Albumin: 4.3 g/dL (ref 3.5–5.2)
Alkaline Phosphatase: 66 U/L (ref 39–117)
Potassium: 4.5 mEq/L (ref 3.5–5.3)
Sodium: 136 mEq/L (ref 135–145)
Total Protein: 7.5 g/dL (ref 6.0–8.3)

## 2011-04-23 LAB — LIPID PANEL
Cholesterol: 241 mg/dL — ABNORMAL HIGH (ref 0–200)
HDL: 66 mg/dL (ref >39)
Total CHOL/HDL Ratio: 3.7 Ratio
VLDL: 23 mg/dL (ref 0–40)

## 2011-04-27 ENCOUNTER — Other Ambulatory Visit: Payer: Self-pay | Admitting: Internal Medicine

## 2011-04-28 DIAGNOSIS — N183 Chronic kidney disease, stage 3 unspecified: Secondary | ICD-10-CM | POA: Insufficient documentation

## 2011-04-28 DIAGNOSIS — Z23 Encounter for immunization: Secondary | ICD-10-CM | POA: Insufficient documentation

## 2011-04-28 DIAGNOSIS — N189 Chronic kidney disease, unspecified: Secondary | ICD-10-CM

## 2011-04-28 DIAGNOSIS — N184 Chronic kidney disease, stage 4 (severe): Secondary | ICD-10-CM | POA: Insufficient documentation

## 2011-04-28 DIAGNOSIS — N179 Acute kidney failure, unspecified: Secondary | ICD-10-CM | POA: Insufficient documentation

## 2011-04-28 HISTORY — DX: Acute kidney failure, unspecified: N18.9

## 2011-04-28 HISTORY — DX: Acute kidney failure, unspecified: N17.9

## 2011-04-28 MED ORDER — LISINOPRIL 40 MG PO TABS: 40.0000 mg | ORAL_TABLET | Freq: Every day | ORAL | Status: DC

## 2011-04-28 MED ORDER — AMLODIPINE BESYLATE 10 MG PO TABS: 10.0000 mg | ORAL_TABLET | Freq: Every day | ORAL | Status: DC

## 2011-04-28 MED ORDER — FUROSEMIDE 40 MG PO TABS: 80.0000 mg | ORAL_TABLET | ORAL | Status: DC

## 2011-04-28 NOTE — Assessment & Plan Note (Signed)
She was running out of her seizure medications but fortunately did not have any seizures. Refill her meds today.

## 2011-04-28 NOTE — Assessment & Plan Note (Addendum)
Check lipid panel today.  Update- based on lipid panel- her risk score for CHD is 7%. Would counsel her on therapeutic lifestyle changes. Start her on statin if LDL >160 mg/dl.

## 2011-04-28 NOTE — Assessment & Plan Note (Signed)
She was a given a flu- shot today.

## 2011-04-28 NOTE — Assessment & Plan Note (Signed)
Check her CMET today.  Update: Her creatinine has trended up from 1.4-1.8 over a period of 9 months which could be a slow progressive decline from her poorly controlled HTN or (?acute on chronic renal failure from slight dehydration) . Will try to get a better control of her HTN.                Follow up in 1 month and recheck her BMET at that time.

## 2011-04-28 NOTE — Assessment & Plan Note (Addendum)
Lab Results  Component Value Date   NA 136 04/22/2011   K 4.5 04/22/2011   CL 101 04/22/2011   CO2 22 04/22/2011   BUN 48* 04/22/2011   CREATININE 1.83* 04/22/2011   CREATININE 1.4* 03/09/2010    BP Readings from Last 3 Encounters:  04/22/11 168/73  07/31/10 158/53  06/04/10 188/80    Assessment: Hypertension control:  mildly elevated  Progress toward goals:  improved Barriers to meeting goals:  no barriers identified  Plan: Hypertension treatment:  Increase metoprolol from 50 to 100 mg BID. Check BMET today

## 2011-04-28 NOTE — Assessment & Plan Note (Signed)
She was counseled on weight reduction and we discussed various measures. She states that she likes water aerobics. She was encouraged to do the exercises that she likes and was also advised  to eat healthy diet that would help in weight reduction.

## 2011-05-16 ENCOUNTER — Encounter: Payer: Self-pay | Admitting: *Deleted

## 2011-05-16 ENCOUNTER — Telehealth: Payer: Self-pay | Admitting: Internal Medicine

## 2011-05-16 NOTE — Telephone Encounter (Signed)
I called the patient and the pharmacy with the dose change for metoprolol but unfortunately could not correct it in EPIC.  She is currently on : Metoprolol 100 mg PO BID

## 2011-06-03 ENCOUNTER — Encounter: Payer: Self-pay | Admitting: Internal Medicine

## 2011-06-03 ENCOUNTER — Ambulatory Visit (INDEPENDENT_AMBULATORY_CARE_PROVIDER_SITE_OTHER): Payer: Medicare Other | Admitting: Internal Medicine

## 2011-06-03 ENCOUNTER — Encounter: Payer: Medicare Other | Admitting: Internal Medicine

## 2011-06-03 VITALS — BP 120/80 | HR 60 | Temp 97.0°F | Ht 61.0 in | Wt 240.0 lb

## 2011-06-03 DIAGNOSIS — I10 Essential (primary) hypertension: Secondary | ICD-10-CM

## 2011-06-03 DIAGNOSIS — R569 Unspecified convulsions: Secondary | ICD-10-CM

## 2011-06-03 DIAGNOSIS — N189 Chronic kidney disease, unspecified: Secondary | ICD-10-CM

## 2011-06-03 DIAGNOSIS — E785 Hyperlipidemia, unspecified: Secondary | ICD-10-CM

## 2011-06-03 DIAGNOSIS — I129 Hypertensive chronic kidney disease with stage 1 through stage 4 chronic kidney disease, or unspecified chronic kidney disease: Secondary | ICD-10-CM

## 2011-06-03 LAB — BASIC METABOLIC PANEL
BUN: 33 mg/dL — ABNORMAL HIGH (ref 6–23)
CO2: 26 mEq/L (ref 19–32)
Calcium: 9.6 mg/dL (ref 8.4–10.5)
Chloride: 101 mEq/L (ref 96–112)
Creat: 1.57 mg/dL — ABNORMAL HIGH (ref 0.50–1.10)
Glucose, Bld: 86 mg/dL (ref 70–99)
Potassium: 4.3 mEq/L (ref 3.5–5.3)
Sodium: 138 mEq/L (ref 135–145)

## 2011-06-03 MED ORDER — PRAVASTATIN SODIUM 40 MG PO TABS: 40.0000 mg | ORAL_TABLET | Freq: Every day | ORAL | Status: DC

## 2011-06-03 NOTE — Assessment & Plan Note (Addendum)
Her lipid panel is elevated. She has two risk factors ( age and HTN), which indicates the goal of her LDL should be < 130.  - start pravachol 40 mg po daily

## 2011-06-03 NOTE — Progress Notes (Signed)
Patient ID: Sherry [name], female   DOB: 1945-11-04, 66 y.o.   MRN: [id number]  Subjective:   Patient ID: Sherry [name] female   DOB: 1946/01/11 66 y.o.   MRN: [id number]  HPI: Sherry [name] is a 66 y.o. woman with past medical history significant for HLD, HTN, seizure disorder comes to the clinic for followup visit since last office visit.  1. HTN She states that she was told to increase her metoprolol from 50 mg po to 100 mg BID since last visit. She has not been checking her BP at home. She is here today for her BP check.  2. Seizure  she has been compliant with her medications. She states that she has not had seizures for over one year. She states that she has had one episode seizure activity where she passed out for 2-3 minutes since last visit in March. Her husband witnessed it. She had post ictal confusion and felt sleepy afterwards. Denies tonic clonic seizure activity. Denies aura. Denies any other seizure activity. Otherwise denies any headaches, blurry vision, dizziness, chest pain, palpitations or shortness of breath.  3. HLD Patient is here for lab review.  4. CKD She is  Here for her BMP    No past medical history on file. Current Outpatient Prescriptions  Medication Sig Dispense Refill  . amLODipine (NORVASC) 10 MG tablet Take 1 tablet (10 mg total) by mouth daily.  90 tablet  2  . aspirin 81 MG EC tablet Take 81 mg by mouth daily.        . Calcium-Vitamin D 600-200 MG-UNIT per tablet Take 2 tablets by mouth daily.        . furosemide (LASIX) 40 MG tablet Take 2 tablets (80 mg total) by mouth every morning.  60 tablet  11  . lisinopril (PRINIVIL,ZESTRIL) 40 MG tablet Take 1 tablet (40 mg total) by mouth daily.  30 tablet  11  . metoprolol (LOPRESSOR) 100 MG tablet Take 100 mg by mouth 2 (two) times daily.      [name] PHENobarbital (LUMINAL) 97.2 MG tablet Take 2 tablets at bedtime  180 tablet  2  . spironolactone (ALDACTONE) 50 MG tablet Take 1 tablet (50 mg  total) by mouth daily.  30 tablet  11  . furosemide (LASIX) 40 MG tablet Take 1 tablet (40 mg total) by mouth daily.  30 tablet  11  . metoprolol (LOPRESSOR) 50 MG tablet Take 1 tablet (50 mg total) by mouth 2 (two) times daily.  60 tablet  11  . pravastatin (PRAVACHOL) 40 MG tablet Take 1 tablet (40 mg total) by mouth daily.  30 tablet  1  . spironolactone (ALDACTONE) 50 MG tablet Take 1 tablet (50 mg total) by mouth daily.  30 tablet  0  . DISCONTD: metoprolol (LOPRESSOR) 50 MG tablet TAKE ONE (1) TABLET(S) BY MOUTH TWICE DAILY  60 tablet  1   No family history on file. History   Social History  . Marital Status: Married    Spouse Name: N/A    Number of Children: N/A  . Years of Education: N/A   Social History Main Topics  . Smoking status: Former Smoker    Quit date: 06/03/2000  . Smokeless tobacco: None  . Alcohol Use: None  . Drug Use: None  . Sexually Active: None   Other Topics Concern  . None   Social History Narrative  . None   Review of Systems:  No headache, fever, or sore throat. No shortness of  breath or dyspnea on exertion. No chest pain, chest pressure or palpitation No nausea, vomiting, or abdominal pain. No melena, diarrhea or incontinence. No muscle weakness.                   Denies depression. No appetite or weight changes.   Objective:  Physical Exam: Filed Vitals:   06/03/11 0930  BP: 120/80  Pulse: 60  Temp: 97 F (36.1 C)  TempSrc: Oral  Height: 5\' 1"  (1.549 m)  Weight: 240 lb (108.863 kg)  onstitutional: She is oriented to person, place, and time. She appears well-developed and well-nourished. No distress.  HENT:  Head: Normocephalic and atraumatic.  Mouth/Throat: No oropharyngeal exudate.  Eyes: Conjunctivae and EOM are normal. Pupils are equal, round, and reactive to light.  Neck: Normal range of motion. Neck supple. No JVD present. No tracheal deviation present. No thyromegaly present.  Cardiovascular: Normal rate, regular rhythm, normal  heart sounds and intact distal pulses.  Exam reveals no gallop and no friction rub.   No murmur heard. Pulmonary/Chest: Effort normal and breath sounds normal. No stridor. No respiratory distress. She has no wheezes. She has no rales.  Abdominal: Soft. Bowel sounds are normal. She exhibits no distension. There is no tenderness. There is no rebound.  Musculoskeletal: Normal range of motion.      1-2 + pedal edema  Lymphadenopathy:    She has no cervical adenopathy.  Neurological: She is alert and oriented to person, place, and time. She has normal reflexes. No cranial nerve deficit. Coordination normal.  Skin: She is not diaphoretic.  Assessment & Plan:

## 2011-06-03 NOTE — Assessment & Plan Note (Addendum)
She has been compliant with her treatment.  Her BP is 120/80 mg.  -instructed to take her BP daily at home. - continue the current regimen. - follow up in 3 weeks.

## 2011-06-03 NOTE — Patient Instructions (Addendum)
1. Follow up in 3 weeks. 2. Add pravachol for your cholesterol

## 2011-06-03 NOTE — Assessment & Plan Note (Addendum)
She ran out of her medications for a few days last month before she came for her office visit on 04/22/11. She had one episode of seizure activity after office visit, which likely due to her lack of consistent treatment during that time.  - Continue current medications  - if worsening, may consider neurology consults

## 2011-06-03 NOTE — Assessment & Plan Note (Signed)
Her baseline is at 1.4 to 1.8. Her last Cr in March 2013 was ~1.8.  Will recheck her BMP today.

## 2011-06-04 LAB — MICROALBUMIN / CREATININE URINE RATIO
Creatinine, Urine: 29.1 mg/dL
Microalb, Ur: 0.65 mg/dL (ref 0.00–1.89)

## 2011-06-24 ENCOUNTER — Ambulatory Visit (INDEPENDENT_AMBULATORY_CARE_PROVIDER_SITE_OTHER): Payer: Medicare Other | Admitting: Internal Medicine

## 2011-06-24 ENCOUNTER — Encounter: Payer: Self-pay | Admitting: Internal Medicine

## 2011-06-24 VITALS — BP 136/66 | HR 63 | Temp 99.0°F | Ht 61.0 in | Wt 240.8 lb

## 2011-06-24 DIAGNOSIS — E785 Hyperlipidemia, unspecified: Secondary | ICD-10-CM

## 2011-06-24 DIAGNOSIS — I10 Essential (primary) hypertension: Secondary | ICD-10-CM

## 2011-06-24 DIAGNOSIS — Z23 Encounter for immunization: Secondary | ICD-10-CM

## 2011-06-24 DIAGNOSIS — R569 Unspecified convulsions: Secondary | ICD-10-CM

## 2011-06-24 DIAGNOSIS — Z0000 Encounter for general adult medical examination without abnormal findings: Secondary | ICD-10-CM

## 2011-06-24 DIAGNOSIS — N189 Chronic kidney disease, unspecified: Secondary | ICD-10-CM

## 2011-06-24 MED ORDER — AMLODIPINE BESYLATE 10 MG PO TABS: 10.0000 mg | ORAL_TABLET | Freq: Every day | ORAL | Status: DC

## 2011-06-24 MED ORDER — SPIRONOLACTONE 50 MG PO TABS: 50.0000 mg | ORAL_TABLET | Freq: Every day | ORAL | Status: DC

## 2011-06-24 MED ORDER — PHENOBARBITAL 97.2 MG PO TABS: ORAL_TABLET | ORAL | Status: DC

## 2011-06-24 MED ORDER — PRAVASTATIN SODIUM 40 MG PO TABS: 40.0000 mg | ORAL_TABLET | Freq: Every day | ORAL | Status: DC

## 2011-06-24 NOTE — Progress Notes (Signed)
Patient ID: Sherry [name], female   DOB: 09-19-1945, 66 y.o.   MRN: [id number] Patient ID: Sherry [name], female   DOB: 06-25-1945, 66 y.o.   MRN: [id number]  Subjective:   Patient ID: Sherry [name] female   DOB: 10-17-1945 66 y.o.   MRN: [id number]  HPI: Ms.[name] is a 66 y.o. woman with past medical history significant for HLD, HTN, seizure disorder comes to the clinic for followup visit since last office visit.  1. HTN She states that she has been taking the increased dose of metoprolol and has been tolerating just fine without side effects. No dizziness, or lightheadedness reported.   2. Seizure: She has been compliant with her medications.Denies tonic clonic seizure activity. Denies aura. Denies any other seizure activity. Otherwise denies any headaches, blurry vision, dizziness, chest pain, palpitations or shortness of breath.  3. HLD - started on statin at last visit. Tolerating fine, without side effects.      Review of Systems:  No headache, fever, or sore throat. No shortness of breath or dyspnea on exertion. No chest pain, chest pressure or palpitation No nausea, vomiting, or abdominal pain. No melena, diarrhea or incontinence. No muscle weakness.                   Denies depression. No appetite or weight changes.   Objective:  Physical Exam: Filed Vitals:   06/24/11 0915  BP: 136/66  Pulse: 63  Temp: 99 F (37.2 C)  TempSrc: Oral  Height: 5\' 1"  (1.549 m)  Weight: 240 lb 12.8 oz (109.226 kg)   Constitutional: She is oriented to person, place, and time. She appears well-developed and well-nourished. No distress.  HENT:  Head: Normocephalic and atraumatic.  Mouth/Throat: No oropharyngeal exudate. Nasal turbinates inflamed Neck: Normal range of motion. Neck supple. No JVD present. No tracheal deviation present. No thyromegaly present.  Cardiovascular: Normal rate, regular rhythm, normal heart sounds and intact distal pulses.  Exam reveals no gallop No  murmur heard. Pulmonary/Chest: Effort normal and breath sounds normal. No respiratory distress. She has no wheezes. She has no rales.  Abdominal: Soft. Bowel sounds are normal.  There is no tenderness. There is no rebound.  Musculoskeletal: Normal range of motion.      1-2 + pedal edema

## 2011-06-24 NOTE — Assessment & Plan Note (Signed)
Lab Results  Component Value Date   NA 138 06/03/2011   K 4.3 06/03/2011   CL 101 06/03/2011   CO2 26 06/03/2011   BUN 33* 06/03/2011   CREATININE 1.57* 06/03/2011   CREATININE 1.4* 03/09/2010    BP Readings from Last 3 Encounters:  06/24/11 136/66  06/03/11 120/80  04/22/11 168/73    Assessment: Hypertension control:  controlled  Progress toward goals:  at goal Barriers to meeting goals:  no barriers identified  Plan: Hypertension treatment:  continue current medications

## 2011-06-24 NOTE — Patient Instructions (Signed)
Please take Loratidine or Claritin once a day. If your symptoms do not improve within 2 weeks, please contact the clinic and ask to speak to Dr. Ina Homes.  Please continue to take all medications as prescribed.

## 2011-06-24 NOTE — Assessment & Plan Note (Signed)
BMET    Component Value Date/Time   NA 138 06/03/2011 0918   K 4.3 06/03/2011 0918   CL 101 06/03/2011 0918   CO2 26 06/03/2011 0918   GLUCOSE 86 06/03/2011 0918   BUN 33* 06/03/2011 0918   CREATININE 1.57* 06/03/2011 0918   CREATININE 1.4* 03/09/2010 1452   CALCIUM 9.6 06/03/2011 0918   GFRNONAA 48* 06/28/2008 2131   GFRAA 58* 06/28/2008 2131    Cr currently at baseline.

## 2011-06-24 NOTE — Assessment & Plan Note (Signed)
Lab Results  Component Value Date   CHOL 241* 04/22/2011   HDL 66 04/22/2011   LDLCALC 152* 04/22/2011   TRIG 116 04/22/2011   CHOLHDL 3.7 04/22/2011   Started statin and tolerating fine. Would check CMP at next visit.

## 2011-09-12 ENCOUNTER — Other Ambulatory Visit: Payer: Self-pay | Admitting: *Deleted

## 2011-09-12 MED ORDER — METOPROLOL TARTRATE 100 MG PO TABS: 100.0000 mg | ORAL_TABLET | Freq: Two times a day (BID) | ORAL | Status: DC

## 2011-10-02 ENCOUNTER — Encounter: Payer: Medicare Other | Admitting: Internal Medicine

## 2011-10-07 ENCOUNTER — Encounter: Payer: Self-pay | Admitting: Internal Medicine

## 2011-10-07 ENCOUNTER — Ambulatory Visit (INDEPENDENT_AMBULATORY_CARE_PROVIDER_SITE_OTHER): Payer: Medicare Other | Admitting: Internal Medicine

## 2011-10-07 VITALS — BP 142/78 | HR 52 | Temp 97.6°F | Ht 61.0 in

## 2011-10-07 DIAGNOSIS — I10 Essential (primary) hypertension: Secondary | ICD-10-CM

## 2011-10-07 DIAGNOSIS — R569 Unspecified convulsions: Secondary | ICD-10-CM

## 2011-10-07 DIAGNOSIS — M25511 Pain in right shoulder: Secondary | ICD-10-CM | POA: Insufficient documentation

## 2011-10-07 DIAGNOSIS — M25519 Pain in unspecified shoulder: Secondary | ICD-10-CM

## 2011-10-07 DIAGNOSIS — N189 Chronic kidney disease, unspecified: Secondary | ICD-10-CM

## 2011-10-07 DIAGNOSIS — I129 Hypertensive chronic kidney disease with stage 1 through stage 4 chronic kidney disease, or unspecified chronic kidney disease: Secondary | ICD-10-CM

## 2011-10-07 LAB — COMPLETE METABOLIC PANEL WITH GFR
ALT: 14 U/L (ref 0–35)
BUN: 29 mg/dL — ABNORMAL HIGH (ref 6–23)
Calcium: 9.4 mg/dL (ref 8.4–10.5)
GFR, Est African American: 42 mL/min — ABNORMAL LOW
GFR, Est Non African American: 36 mL/min — ABNORMAL LOW
Potassium: 4.2 mEq/L (ref 3.5–5.3)
Total Protein: 7.3 g/dL (ref 6.0–8.3)

## 2011-10-07 MED ORDER — ACETAMINOPHEN 325 MG PO TABS: 650.0000 mg | ORAL_TABLET | Freq: Four times a day (QID) | ORAL | Status: AC | PRN

## 2011-10-07 NOTE — Assessment & Plan Note (Signed)
CKD stable at baseline  - repeat CMP

## 2011-10-07 NOTE — Assessment & Plan Note (Signed)
Well controlled except report of two episodes of "black out" for 2-3 minutes this year. Denies any clonic-tonic seizures and [place] seizures.   She is on Phenobarbital for seizure control and has not followed up with her neurologist for a while.  - will send her for neurologist for followup

## 2011-10-07 NOTE — Patient Instructions (Addendum)
1. Follow up in one month 2. Will send referral for neurologist and sports medicine 3. Tylenol 650 mg po every 6 hours as needed for your pain.

## 2011-10-07 NOTE — Assessment & Plan Note (Signed)
Well controlled on multiple antihypertensive medications. She had renal artery duplex done in 2012.  - will obtain full report - if she does not renal artery stenosis, it will change the treatment plan. We will continue to max her medical treatment.

## 2011-10-07 NOTE — Assessment & Plan Note (Addendum)
Right shoulder pain worsening with activity. Endorses numbness and tingling. No weakness. No injury or trauma. Tenderness and limited ROM on exam.   - will send her for sports medicine referral. - Tylenol 650 mg po Q6 PRN - No NSAIDs due to renal failure

## 2011-10-07 NOTE — Progress Notes (Signed)
Patient ID: Sherry [name], female   DOB: 1945/03/24, 66 y.o.   MRN: [id number]  Subjective:   Patient ID: Sherry [name] female   DOB: March 30, 1945 66 y.o.   MRN: [id number]  HPI: [name] is a 66 y.o. woman with past medical history significant for HLD, HTN, seizure disorder comes to the clinic for followup visit since last office visit.  1. HTN Patient is on multiple antihypertensive medications, and her BP is fairly controlled. She has had Kidney Duplex evaluated last year. Will obtain full report.   2. Seizure  she has been compliant with her medications. She states that she has not had seizures for over one year. She states that she has had two episodes "black out" where she passed out for 2-3 minutes. Last episode was one month ago. She had post ictal confusion and felt sleepy afterwards. Denies tonic clonic seizure activity. Denies aura. Denies any other seizure activity. Otherwise denies any headaches, blurry vision, dizziness, chest pain, palpitations or shortness of breath. She has not seen her neurologist over 1 year.    3. CKD  Last Cr 1.57 which is at her baseline. will recheck her CMP today.   5. Right shoulder pain  Patient reports right shoulder pain for one month. Denies injury or trauma. She is afraid to use her right arm. Endorses numbness and tingling.   No past medical history on file. Current Outpatient Prescriptions  Medication Sig Dispense Refill  . amLODipine (NORVASC) 10 MG tablet Take 1 tablet (10 mg total) by mouth daily.  90 tablet  2  . aspirin 81 MG EC tablet Take 81 mg by mouth daily.        . Calcium-Vitamin D 600-200 MG-UNIT per tablet Take 2 tablets by mouth daily.        . furosemide (LASIX) 40 MG tablet Take 2 tablets (80 mg total) by mouth every morning.  60 tablet  11  . lisinopril (PRINIVIL,ZESTRIL) 40 MG tablet Take 1 tablet (40 mg total) by mouth daily.  30 tablet  11  . metoprolol (LOPRESSOR) 100 MG tablet Take 1 tablet (100 mg  total) by mouth 2 (two) times daily.  60 tablet  11  . PHENobarbital (LUMINAL) 97.2 MG tablet Take 2 tablets at bedtime  180 tablet  2  . pravastatin (PRAVACHOL) 40 MG tablet Take 1 tablet (40 mg total) by mouth daily.  30 tablet  6  . spironolactone (ALDACTONE) 50 MG tablet Take 1 tablet (50 mg total) by mouth daily.  30 tablet  11  . acetaminophen (TYLENOL) 325 MG tablet Take 2 tablets (650 mg total) by mouth every 6 (six) hours as needed for pain.  100 tablet  2   No family history on file. History   Social History  . Marital Status: Married    Spouse Name: N/A    Number of Children: N/A  . Years of Education: N/A   Social History Main Topics  . Smoking status: Former Smoker    Quit date: 06/03/2000  . Smokeless tobacco: None  . Alcohol Use: No  . Drug Use: No  . Sexually Active: None   Other Topics Concern  . None   Social History Narrative  . None   Review of Systems:  No headache, fever, or sore throat. No shortness of breath or dyspnea on exertion. No chest pain, chest pressure or palpitation No nausea, vomiting, or abdominal pain. No melena, diarrhea or incontinence. No muscle weakness.  Denies depression. No appetite or weight changes.   Objective:  Physical Exam: Filed Vitals:   10/07/11 1057 10/07/11 1058  BP:  142/78  Pulse: 52   Temp: 97.6 F (36.4 C)   TempSrc: Oral   Height: 5\' 1"  (1.549 m)   General: alert, well-developed, and cooperative to examination.  Head: normocephalic and atraumatic.  Eyes: vision grossly intact, pupils equal, pupils round, pupils reactive to light, no injection and anicteric.  Mouth: pharynx pink and moist, no erythema, and no exudates.  Neck: supple, full ROM, no thyromegaly, no JVD, and no carotid bruits.  Lungs: normal respiratory effort, no accessory muscle use, normal breath sounds, no crackles, and no wheezes. Heart: normal rate, regular rhythm, no murmur, no gallop, and no rub.  Abdomen: soft,  non-tender, normal bowel sounds, no distention, no guarding, no rebound tenderness, no hepatomegaly, and no splenomegaly.  Msk: no joint swelling, no joint warmth, and no redness over joints. Right shoulder tenderness noted anterior and posteriorly. Limited ROM on all directions.  Pulses: 2+ DP/PT pulses bilaterally Extremities: No cyanosis, clubbing, edema Neurologic: alert & oriented X3, cranial nerves II-XII intact, strength normal in all extremities, sensation intact to light touch, and gait normal.  Skin: turgor normal and no rashes.  Psych: Oriented X3, memory intact for recent and remote, normally interactive, good eye contact, not anxious appearing, and not depressed appearing.   Assessment & Plan:

## 2011-10-15 ENCOUNTER — Telehealth: Payer: Self-pay | Admitting: Internal Medicine

## 2011-10-15 NOTE — Telephone Encounter (Signed)
Discussed with patient's daughter about kidney Duplex done 2012.  Vascular lab called and talked to "Joaquim Lai" who will contact Dr. Deitra Mayo for report on kidney Duplex.  I left my pager to her.

## 2011-10-28 ENCOUNTER — Ambulatory Visit: Payer: Medicare Other | Admitting: Sports Medicine

## 2011-10-28 NOTE — Addendum Note (Signed)
Addended by: Hulan Fray on: 10/28/2011 06:23 PM   Modules accepted: Orders

## 2011-11-14 ENCOUNTER — Encounter: Payer: Self-pay | Admitting: Internal Medicine

## 2011-11-14 ENCOUNTER — Ambulatory Visit (INDEPENDENT_AMBULATORY_CARE_PROVIDER_SITE_OTHER): Payer: Medicare Other | Admitting: Internal Medicine

## 2011-11-14 VITALS — BP 164/72 | HR 63 | Temp 97.6°F | Ht 61.0 in | Wt 236.6 lb

## 2011-11-14 DIAGNOSIS — I10 Essential (primary) hypertension: Secondary | ICD-10-CM

## 2011-11-14 DIAGNOSIS — R569 Unspecified convulsions: Secondary | ICD-10-CM

## 2011-11-14 DIAGNOSIS — N189 Chronic kidney disease, unspecified: Secondary | ICD-10-CM

## 2011-11-14 DIAGNOSIS — M25511 Pain in right shoulder: Secondary | ICD-10-CM

## 2011-11-14 DIAGNOSIS — Z0000 Encounter for general adult medical examination without abnormal findings: Secondary | ICD-10-CM

## 2011-11-14 DIAGNOSIS — M25519 Pain in unspecified shoulder: Secondary | ICD-10-CM

## 2011-11-14 DIAGNOSIS — E785 Hyperlipidemia, unspecified: Secondary | ICD-10-CM

## 2011-11-14 DIAGNOSIS — Z23 Encounter for immunization: Secondary | ICD-10-CM

## 2011-11-14 NOTE — Assessment & Plan Note (Signed)
She was referred to neurology and she has not seen her neurologist yet  - she is instructed to follow up.

## 2011-11-14 NOTE — Progress Notes (Addendum)
Patient ID: Sherry [name], female   DOB: 1945-11-23, 66 y.o.   MRN: [id number]  Subjective:   Patient ID: Sherry [name] female   DOB: 03-26-1945 66 y.o.   MRN: [id number]  HPI: [name] is a 66 y.o. woman with past medical history significant for HLD, HTN, seizure disorder comes to the clinic for followup visit since last office visit.  1. HTN Patient is on multiple antihypertensive medications, and her BP is fairly controlled. She has had Kidney Duplex evaluated last year. I have called vascular lab and still unable to get the full report. She is on Norvasc 10, metoprolol 100 BID, Spirolactone 50, Lasix 80 QD, Lisinopril 40,   2. Seizure  she has been compliant with her medications. She states that she has not had seizures for over one year. She states that she has had two episodes "black out" where she passed out for 2-3 minutes. Last episode was one month ago. She had post ictal confusion and felt sleepy afterwards. Denies tonic clonic seizure activity. Denies aura. Denies any other seizure activity. Otherwise denies any headaches, blurry vision, dizziness, chest pain, palpitations or shortness of breath. She has not seen her neurologist over 1 year.   3. CKD , stage III Last Cr 1.51 which is at her baseline. will recheck her BMP today.   4. Right shoulder pain  Patient reports right shoulder pain for two month. Denies injury or trauma. She is afraid to use her right arm. Endorses numbness and tingling. She was referred to sports medicine last month and missed her Appt. She states that her shoulder pain is unchanged.   Health maintenance 1. Pap smear>>overdue. She has had TAH years ago. Refused pap smear 2. Colonoscopy>>>overdue. She refused  3. DEXA>>>never had one. Refused. 4. Zoster vaccine>>>RX given today  No past medical history on file. Current Outpatient Prescriptions  Medication Sig Dispense Refill  . acetaminophen (TYLENOL) 325 MG tablet Take 2 tablets  (650 mg total) by mouth every 6 (six) hours as needed for pain.  100 tablet  2  . amLODipine (NORVASC) 10 MG tablet Take 1 tablet (10 mg total) by mouth daily.  90 tablet  2  . aspirin 81 MG EC tablet Take 81 mg by mouth daily.        . Calcium-Vitamin D 600-200 MG-UNIT per tablet Take 2 tablets by mouth daily.        . furosemide (LASIX) 40 MG tablet Take 2 tablets (80 mg total) by mouth every morning.  60 tablet  11  . lisinopril (PRINIVIL,ZESTRIL) 40 MG tablet Take 1 tablet (40 mg total) by mouth daily.  30 tablet  11  . metoprolol (LOPRESSOR) 100 MG tablet Take 1 tablet (100 mg total) by mouth 2 (two) times daily.  60 tablet  11  . PHENobarbital (LUMINAL) 97.2 MG tablet Take 2 tablets at bedtime  180 tablet  2  . pravastatin (PRAVACHOL) 40 MG tablet Take 1 tablet (40 mg total) by mouth daily.  30 tablet  6  . spironolactone (ALDACTONE) 50 MG tablet Take 1 tablet (50 mg total) by mouth daily.  30 tablet  11   No family history on file. History   Social History  . Marital Status: Married    Spouse Name: N/A    Number of Children: N/A  . Years of Education: N/A   Social History Main Topics  . Smoking status: Former Smoker    Quit date: 06/03/2000  . Smokeless tobacco: None  . Alcohol  Use: No  . Drug Use: No  . Sexually Active: None   Other Topics Concern  . None   Social History Narrative  . None   Review of Systems:  No headache, fever, or sore throat. No shortness of breath or dyspnea on exertion. No chest pain, chest pressure or palpitation No nausea, vomiting, or abdominal pain. No melena, diarrhea or incontinence. No muscle weakness.                   Denies depression. No appetite or weight changes.   Objective:  Physical Exam: Filed Vitals:   11/14/11 1400  BP: 164/72  Pulse: 63  Temp: 97.6 F (36.4 C)  TempSrc: Oral  Height: 5\' 1"  (1.549 m)  Weight: 236 lb 9.6 oz (107.321 kg)  SpO2: 98%  General: alert, well-developed, and cooperative to examination.  Head:  normocephalic and atraumatic.  Eyes: vision grossly intact, pupils equal, pupils round, pupils reactive to light, no injection and anicteric.  Mouth: pharynx pink and moist, no erythema, and no exudates.  Neck: supple, full ROM, no thyromegaly, no JVD, and no carotid bruits.  Lungs: normal respiratory effort, no accessory muscle use, normal breath sounds, no crackles, and no wheezes. Heart: normal rate, regular rhythm, no murmur, no gallop, and no rub.  Abdomen: soft, non-tender, normal bowel sounds, no distention, no guarding, no rebound tenderness, no hepatomegaly, and no splenomegaly.  Msk: no joint swelling, no joint warmth, and no redness over joints. Right shoulder tenderness noted anterior and posteriorly. Limited ROM on all directions.  Pulses: 2+ DP/PT pulses bilaterally Extremities: No cyanosis, clubbing, edema Neurologic: alert & oriented X3, cranial nerves II-XII intact, strength normal in all extremities, sensation intact to light touch, and gait normal.  Skin: turgor normal and no rashes.  Psych: Oriented X3, memory intact for recent and remote, normally interactive, good eye contact, not anxious appearing, and not depressed appearing.   Assessment & Plan:    Addendum Received assessment from [facility] house call service with following potential concerns.  1. Snoring    Given patient's body habitus and morbid obesity, OSA is likely needed. Will discuss with patient during next visit. 2. Heart Murmur     Nursing staff noticed ? Heart murmur during the house call. Last Echo in 2008. Given her chronic heart failure. May consider repeat echo. 3. Abnormal mini Cog reported as well     Will screen for dementia 4. Falls and possible neuropathy    Though her CBGS have been unremarkable, will need to obtain her Hb A1C during next visit.

## 2011-11-14 NOTE — Assessment & Plan Note (Signed)
Right shoulder pain unchanged.   Will ask her to reschedule her appt.

## 2011-11-14 NOTE — Assessment & Plan Note (Signed)
She is on Norvasc 10, metoprolol 100 BID, Spirolactone 50, Lasix 80 QD, Lisinopril 40. She presents with the higher BP at 164/72 and repeat BP 128/80.  - will continue the current regimen. - will call vascular surgeon for full report.

## 2011-11-14 NOTE — Patient Instructions (Addendum)
1. Please follow up with Sports medicine and Neurologist. Both referral were sent already 2. Continue your current medications for hypertension 3. Check your BP at home daily.  4. Follow up in 3 months

## 2011-11-15 LAB — LIPID PANEL
Cholesterol: 177 mg/dL (ref 0–200)
HDL: 59 mg/dL (ref >39)
Total CHOL/HDL Ratio: 3 Ratio
VLDL: 19 mg/dL (ref 0–40)

## 2011-11-15 LAB — BASIC METABOLIC PANEL WITH GFR
BUN: 35 mg/dL — ABNORMAL HIGH (ref 6–23)
CO2: 27 mEq/L (ref 19–32)
Chloride: 104 mEq/L (ref 96–112)
Creat: 1.88 mg/dL — ABNORMAL HIGH (ref 0.50–1.10)
GFR, Est African American: 32 mL/min — ABNORMAL LOW
GFR, Est Non African American: 28 mL/min — ABNORMAL LOW
Sodium: 141 mEq/L (ref 135–145)

## 2011-11-29 ENCOUNTER — Encounter: Payer: Self-pay | Admitting: Internal Medicine

## 2011-12-17 ENCOUNTER — Encounter: Payer: Self-pay | Admitting: Internal Medicine

## 2012-01-02 DIAGNOSIS — R2991 Unspecified symptoms and signs involving the musculoskeletal system: Secondary | ICD-10-CM | POA: Insufficient documentation

## 2012-01-02 DIAGNOSIS — G40219 Localization-related (focal) (partial) symptomatic epilepsy and epileptic syndromes with complex partial seizures, intractable, without status epilepticus: Secondary | ICD-10-CM

## 2012-01-02 HISTORY — DX: Localization-related (focal) (partial) symptomatic epilepsy and epileptic syndromes with complex partial seizures, intractable, without status epilepticus: G40.219

## 2012-01-20 ENCOUNTER — Encounter: Payer: Self-pay | Admitting: Internal Medicine

## 2012-01-20 DIAGNOSIS — K648 Other hemorrhoids: Secondary | ICD-10-CM

## 2012-01-20 DIAGNOSIS — K644 Residual hemorrhoidal skin tags: Secondary | ICD-10-CM | POA: Insufficient documentation

## 2012-01-20 DIAGNOSIS — M5030 Other cervical disc degeneration, unspecified cervical region: Secondary | ICD-10-CM | POA: Insufficient documentation

## 2012-01-20 HISTORY — DX: Residual hemorrhoidal skin tags: K64.4

## 2012-01-23 ENCOUNTER — Encounter: Payer: Medicare Other | Admitting: Internal Medicine

## 2012-01-23 DIAGNOSIS — I6990 Unspecified sequelae of unspecified cerebrovascular disease: Secondary | ICD-10-CM

## 2012-01-23 HISTORY — DX: Unspecified sequelae of unspecified cerebrovascular disease: I69.90

## 2012-02-13 ENCOUNTER — Other Ambulatory Visit: Payer: Self-pay | Admitting: Internal Medicine

## 2012-02-19 DIAGNOSIS — G459 Transient cerebral ischemic attack, unspecified: Secondary | ICD-10-CM | POA: Insufficient documentation

## 2012-02-19 HISTORY — DX: Transient cerebral ischemic attack, unspecified: G45.9

## 2012-03-05 ENCOUNTER — Telehealth: Payer: Self-pay | Admitting: *Deleted

## 2012-03-05 NOTE — Telephone Encounter (Signed)
[facility] from home PT - called about BP 200/96 and 180/84. Denies any changes. Being tx in Dec 2013 for seizures at [facility]. Appt made 03/10/12 3:15PM Dr Algis Liming. If any changes suggest going to ER. Only has transportation on Mon and Tues from family.  Will try to get records faxed to clinic from [facility] before appt. Hilda Blades Murl Zogg RN 03/05/12 5PM

## 2012-03-10 ENCOUNTER — Encounter: Payer: Self-pay | Admitting: Internal Medicine

## 2012-03-10 ENCOUNTER — Ambulatory Visit (INDEPENDENT_AMBULATORY_CARE_PROVIDER_SITE_OTHER): Payer: Medicare Other | Admitting: Internal Medicine

## 2012-03-10 VITALS — BP 159/74 | HR 52 | Temp 97.0°F | Ht 61.0 in

## 2012-03-10 DIAGNOSIS — R569 Unspecified convulsions: Secondary | ICD-10-CM

## 2012-03-10 DIAGNOSIS — I10 Essential (primary) hypertension: Secondary | ICD-10-CM

## 2012-03-10 NOTE — Progress Notes (Signed)
Subjective:   Patient ID: Sherry [name] female   DOB: [birth year] 67 y.o.   MRN: [id number]  HPI: Sherry [name] is a 67 y.o. woman with a past medical history of hypertension, hyperlipidemia, and seizures. Sherry [name] presenting today with her daughter for some outside of clinic/Hospital readings of hypertension as high as the 200s. Patient has been asymptomatic during these readings and there is some question as to whether the reading was taken after or during physical activity. The patient was currently evaluated and [facility] at [facility] on 12/13 for questionable stroke/repeat seizure activity. She has not had any repeat numbness/tingling, weakness, blurry vision, headaches, or changes in behavior since that time. Patient reports being compliant with her medication and has no other complaints.    No past medical history on file. Current Outpatient Prescriptions  Medication Sig Dispense Refill  . acetaminophen (TYLENOL) 325 MG tablet Take 2 tablets (650 mg total) by mouth every 6 (six) hours as needed for pain.  100 tablet  2  . amLODipine (NORVASC) 10 MG tablet Take 1 tablet (10 mg total) by mouth daily.  90 tablet  2  . aspirin 81 MG EC tablet Take 81 mg by mouth daily.        . Calcium-Vitamin D 600-200 MG-UNIT per tablet Take 2 tablets by mouth daily.        . furosemide (LASIX) 40 MG tablet Take 2 tablets (80 mg total) by mouth every morning.  60 tablet  11  . lisinopril (PRINIVIL,ZESTRIL) 40 MG tablet Take 1 tablet (40 mg total) by mouth daily.  30 tablet  11  . metoprolol (LOPRESSOR) 100 MG tablet Take 1 tablet (100 mg total) by mouth 2 (two) times daily.  60 tablet  11  . PHENobarbital (LUMINAL) 97.2 MG tablet Take 2 tablets at bedtime  180 tablet  2  . pravastatin (PRAVACHOL) 40 MG tablet TAKE ONE TABLET BY MOUTH ONE TIME DAILY  30 tablet  1  . spironolactone (ALDACTONE) 50 MG tablet Take 1 tablet (50 mg total) by mouth daily.  30 tablet  11   No family  history on file. History   Social History  . Marital Status: Married    Spouse Name: N/A    Number of Children: N/A  . Years of Education: N/A   Social History Main Topics  . Smoking status: Former Smoker    Quit date: 06/03/2000  . Smokeless tobacco: None  . Alcohol Use: No  . Drug Use: No  . Sexually Active: None   Other Topics Concern  . None   Social History Narrative  . None   Review of Systems: otherwise negative unless listed in HPI  Objective:  Physical Exam: Filed Vitals:   03/10/12 1521  BP: 159/74  Pulse: 52  Temp: 97 F (36.1 C)  TempSrc: Oral  Height: 5\' 1"  (1.549 m)  SpO2: 97%   General: NAD HEENT: PERRL, EOMI, no scleral icterus Cardiac: RRR, no rubs, murmurs or gallops Pulm: clear to auscultation bilaterally, moving normal volumes of air Abd: soft, nontender, nondistended, BS present Ext: warm and well perfused, no pedal edema Neuro: alert and oriented X3, cranial nerves II-XII grossly intact  Assessment & Plan:  1.hypertension: Patient has a long-standing history of hypertension and was referred today for some outside clinic readings of blood pressures in the 200s over 90s during physical therapy. Patient has been compliant with her medication and is at her baseline blood pressure today 160s/70s. -Continue current treatment of  lisinopril 40, amlodipine 10 mg, spinal Actos 50 mg, metoprolol 100 mg twice a day, Lasix 80 daily -May consider nephrology referral in light of CTD stage III this was discussed with patient and is positive for at this time and maybe revisit at followup appointment with PCP -Letter agreeing for patient to continue with PT was given  2.seizure: Patient has long-standing history of seizures currently on phenobarbital and being evaluated and [facility] at [facility]. -Obtaining records at this time  Pt discussed with Dr. Lynnae January

## 2012-03-10 NOTE — Patient Instructions (Signed)
The patient is able to continue with physical therapy at this time. Please check BP only at rest waiting five minutes after activity. If this continues to be a problem please do not hesitate to call the clinic: [phone].   Thank  You,   Clinton Gallant, MD

## 2012-03-13 ENCOUNTER — Encounter: Payer: Self-pay | Admitting: Internal Medicine

## 2012-03-18 ENCOUNTER — Encounter: Payer: Self-pay | Admitting: Internal Medicine

## 2012-03-18 NOTE — Telephone Encounter (Signed)
Test results from [facility] are under the "[place]" tab.

## 2012-03-21 ENCOUNTER — Encounter: Payer: Self-pay | Admitting: Internal Medicine

## 2012-03-25 ENCOUNTER — Encounter: Payer: Self-pay | Admitting: Internal Medicine

## 2012-04-03 ENCOUNTER — Other Ambulatory Visit: Payer: Self-pay | Admitting: Internal Medicine

## 2012-04-06 ENCOUNTER — Encounter: Payer: Self-pay | Admitting: Internal Medicine

## 2012-04-10 ENCOUNTER — Other Ambulatory Visit: Payer: Self-pay | Admitting: Internal Medicine

## 2012-04-12 ENCOUNTER — Encounter: Payer: Self-pay | Admitting: Internal Medicine

## 2012-04-13 ENCOUNTER — Encounter: Payer: Self-pay | Admitting: Internal Medicine

## 2012-04-27 ENCOUNTER — Encounter: Payer: Self-pay | Admitting: Internal Medicine

## 2012-04-30 ENCOUNTER — Encounter: Payer: Medicare Other | Admitting: Internal Medicine

## 2012-05-07 ENCOUNTER — Ambulatory Visit (INDEPENDENT_AMBULATORY_CARE_PROVIDER_SITE_OTHER): Payer: Medicare Other | Admitting: Internal Medicine

## 2012-05-07 ENCOUNTER — Encounter: Payer: Self-pay | Admitting: Internal Medicine

## 2012-05-07 VITALS — BP 157/82 | HR 52 | Temp 97.1°F | Ht 61.0 in | Wt 235.7 lb

## 2012-05-07 DIAGNOSIS — I6381 Other cerebral infarction due to occlusion or stenosis of small artery: Secondary | ICD-10-CM

## 2012-05-07 DIAGNOSIS — I6350 Cerebral infarction due to unspecified occlusion or stenosis of unspecified cerebral artery: Secondary | ICD-10-CM

## 2012-05-07 NOTE — Progress Notes (Signed)
Patient ID: Sherry [name], female   DOB: [birth year], 67 y.o.   MRN: [id number]  Subjective:   Patient ID: Sherry [name] female   DOB: May 05, 1945 67 y.o.   MRN: [id number]  HPI: [name] is a 67 y.o. woman with past medical history significant for HLD, HTN, seizure disorder comes to the clinic for Power chair evaluation for activities for ADLs to move from room to room.   Unable to use manual WC or walker as she is unable to stand or walk.  Unable to use mannual WC 2/2 weak upper body strength .  Scooter will not work at home environment. Not enough space.   Patient has late effects of CVA. Inability to shift weight. And has intermittent sacral skin breakdown per family.   Patient needs tilt/ recline Power WC to redistribute body weight. Will need PT eval for Power WC.   Patient is mentally and physically able to operate [place] at home  Face to face interaction is 45 minutes.   No past medical history on file. Current Outpatient Prescriptions  Medication Sig Dispense Refill  . acetaminophen (TYLENOL) 325 MG tablet Take 2 tablets (650 mg total) by mouth every 6 (six) hours as needed for pain.  100 tablet  2  . amLODipine (NORVASC) 10 MG tablet Take 1 tablet (10 mg total) by mouth daily.  90 tablet  2  . aspirin 81 MG EC tablet Take 81 mg by mouth daily.        . Calcium-Vitamin D 600-200 MG-UNIT per tablet Take 2 tablets by mouth daily.        . furosemide (LASIX) 40 MG tablet TAKE 2 TABLETS BY MOUTH EVERY MORNING  60 tablet  0  . lisinopril (PRINIVIL,ZESTRIL) 40 MG tablet Take 1 tablet (40 mg total) by mouth daily.  30 tablet  11  . metoprolol (LOPRESSOR) 100 MG tablet Take 1 tablet (100 mg total) by mouth 2 (two) times daily.  60 tablet  11  . PHENobarbital (LUMINAL) 97.2 MG tablet Take 2 tablets at bedtime  180 tablet  2  . pravastatin (PRAVACHOL) 40 MG tablet TAKE ONE TABLET BY MOUTH ONE TIME DAILY  30 tablet  11  . spironolactone (ALDACTONE) 50 MG tablet Take 1  tablet (50 mg total) by mouth daily.  30 tablet  11   No current facility-administered medications for this visit.   No family history on file. History   Social History  . Marital Status: Married    Spouse Name: N/A    Number of Children: N/A  . Years of Education: N/A   Social History Main Topics  . Smoking status: Former Smoker    Quit date: 06/03/2000  . Smokeless tobacco: None  . Alcohol Use: No  . Drug Use: No  . Sexually Active: None   Other Topics Concern  . None   Social History Narrative  . None   Review of Systems:  No headache, fever, or sore throat. No shortness of breath or dyspnea on exertion. No chest pain, chest pressure or palpitation No nausea, vomiting, or abdominal pain. No melena, diarrhea or incontinence. No muscle weakness.                   Denies depression. No appetite or weight changes.   Objective:  Physical Exam: Filed Vitals:   05/07/12 1614  BP: 157/82  Pulse: 52  Temp: 97.1 F (36.2 C)  TempSrc: Oral  Height: 5\' 1"  (1.549 m)  Weight:  235 lb 11.2 oz (106.913 kg)  SpO2: 100%  General: alert, well-developed, and cooperative to examination.  Head: normocephalic and atraumatic.  Eyes: vision grossly intact, pupils equal, pupils round, pupils reactive to light, no injection and anicteric.  Mouth: pharynx pink and moist, no erythema, and no exudates.  Neck: supple, full ROM, no thyromegaly, no JVD, and no carotid bruits.  Lungs: normal respiratory effort, no accessory muscle use, normal breath sounds, no crackles, and no wheezes. Heart: normal rate, regular rhythm, no murmur, no gallop, and no rub.  Abdomen: soft, non-tender, normal bowel sounds, no distention, no guarding, no rebound tenderness, no hepatomegaly, and no splenomegaly.  Msk: no joint swelling, no joint warmth, and no redness over joints. Pulses: 2+ DP/PT pulses bilaterally Extremities: No cyanosis, clubbing, edema Neurologic: alert & oriented X3, cranial nerves II-XII  intact, strength normal in all extremities, sensation intact to light touch, and gait normal. Generalized weakness especially at left sided.  Skin: turgor normal and no rashes. Sacral skin redness noted Psych: Oriented X3, memory intact for recent and remote, normally interactive, good eye contact, not anxious appearing, and not depressed appearing.   Assessment & Plan:

## 2012-05-07 NOTE — Patient Instructions (Addendum)
1. Follow-up in 3 months.

## 2012-05-11 ENCOUNTER — Encounter: Payer: Self-pay | Admitting: Internal Medicine

## 2012-05-11 ENCOUNTER — Other Ambulatory Visit: Payer: Self-pay | Admitting: *Deleted

## 2012-05-11 DIAGNOSIS — I10 Essential (primary) hypertension: Secondary | ICD-10-CM

## 2012-05-11 MED ORDER — FUROSEMIDE 40 MG PO TABS: 80.0000 mg | ORAL_TABLET | Freq: Every day | ORAL | Status: DC

## 2012-05-11 MED ORDER — LISINOPRIL 40 MG PO TABS: 40.0000 mg | ORAL_TABLET | Freq: Every day | ORAL | Status: DC

## 2012-05-11 NOTE — Telephone Encounter (Signed)
Talked with pt about doing labs this week - problems with transportation. Will try to get in for only labs.

## 2012-05-18 ENCOUNTER — Encounter: Payer: Self-pay | Admitting: Internal Medicine

## 2012-05-19 ENCOUNTER — Other Ambulatory Visit (INDEPENDENT_AMBULATORY_CARE_PROVIDER_SITE_OTHER): Payer: Medicare Other

## 2012-05-19 ENCOUNTER — Encounter: Payer: Self-pay | Admitting: Internal Medicine

## 2012-05-19 DIAGNOSIS — I10 Essential (primary) hypertension: Secondary | ICD-10-CM

## 2012-05-20 LAB — BASIC METABOLIC PANEL WITH GFR
CO2: 28 mEq/L (ref 19–32)
Chloride: 102 mEq/L (ref 96–112)
Glucose, Bld: 74 mg/dL (ref 70–99)
Potassium: 4.1 mEq/L (ref 3.5–5.3)
Sodium: 139 mEq/L (ref 135–145)

## 2012-06-04 ENCOUNTER — Ambulatory Visit: Payer: Medicare Other | Attending: Internal Medicine | Admitting: Physical Therapy

## 2012-06-04 DIAGNOSIS — R262 Difficulty in walking, not elsewhere classified: Secondary | ICD-10-CM | POA: Insufficient documentation

## 2012-06-04 DIAGNOSIS — IMO0001 Reserved for inherently not codable concepts without codable children: Secondary | ICD-10-CM | POA: Insufficient documentation

## 2012-06-08 ENCOUNTER — Encounter: Payer: Self-pay | Admitting: *Deleted

## 2012-06-08 ENCOUNTER — Other Ambulatory Visit: Payer: Self-pay | Admitting: *Deleted

## 2012-06-08 MED ORDER — FUROSEMIDE 40 MG PO TABS: 80.0000 mg | ORAL_TABLET | Freq: Every day | ORAL | Status: DC

## 2012-06-11 ENCOUNTER — Encounter: Payer: Self-pay | Admitting: Internal Medicine

## 2012-06-13 ENCOUNTER — Encounter: Payer: Self-pay | Admitting: Internal Medicine

## 2012-06-15 ENCOUNTER — Telehealth: Payer: Self-pay | Admitting: Internal Medicine

## 2012-06-15 ENCOUNTER — Encounter: Payer: Self-pay | Admitting: Internal Medicine

## 2012-06-15 NOTE — Telephone Encounter (Signed)
  INTERNAL MEDICINE RESIDENCY PROGRAM After-Hours Telephone Call    Reason for call:   I placed an outgoing call to [name] at 630 pm. I received an email from patient and her daughter who states that she was noted to have a low HR during her physical therapy on 06/04/12. I called patient who told me that her HR has always been at 35's. And she is asymptomatic. Of note, she is on Metoprolol 100 mg po BID.     Last encounter / Pertinent Data:    Last seen in Clinic by Dr. Nicoletta Dress for follow up on 05/07/12.     Assessment/ Plan:   I instruct patient that her relatively low HR is related to the medical therapy with metoprolol. And we should continue her treatment if she is hemodynamically stable and asymptomatic. Patient states that she has always had a relatively low HR and she feels fine now. She states that she will discuss with her daughter to see if she wants to see me or not. I will route this message to the front desk.   As always, pt is advised that if symptoms worsen or new symptoms arise, they should go to an urgent care facility or to to ER for further evaluation.    Charlann Lange, MD   06/15/2012, 6:40 PM

## 2012-06-16 ENCOUNTER — Encounter: Payer: Self-pay | Admitting: Internal Medicine

## 2012-06-23 ENCOUNTER — Other Ambulatory Visit: Payer: Self-pay | Admitting: Internal Medicine

## 2012-06-24 ENCOUNTER — Encounter: Payer: Self-pay | Admitting: Internal Medicine

## 2012-06-29 ENCOUNTER — Encounter: Payer: Self-pay | Admitting: Internal Medicine

## 2012-07-02 ENCOUNTER — Ambulatory Visit (HOSPITAL_COMMUNITY)
Admission: RE | Admit: 2012-07-02 | Discharge: 2012-07-02 | Disposition: A | Discharge status: Home / Self Care | Payer: Medicare Other | Source: Ambulatory Visit | Attending: Internal Medicine | Admitting: Internal Medicine

## 2012-07-02 ENCOUNTER — Ambulatory Visit (INDEPENDENT_AMBULATORY_CARE_PROVIDER_SITE_OTHER): Payer: Medicare Other | Admitting: Internal Medicine

## 2012-07-02 VITALS — BP 162/93 | HR 52

## 2012-07-02 DIAGNOSIS — R609 Edema, unspecified: Secondary | ICD-10-CM

## 2012-07-02 DIAGNOSIS — R001 Bradycardia, unspecified: Secondary | ICD-10-CM

## 2012-07-02 DIAGNOSIS — I498 Other specified cardiac arrhythmias: Secondary | ICD-10-CM

## 2012-07-02 DIAGNOSIS — R9431 Abnormal electrocardiogram [ECG] [EKG]: Secondary | ICD-10-CM | POA: Insufficient documentation

## 2012-07-02 HISTORY — DX: Bradycardia, unspecified: R00.1

## 2012-07-02 MED ORDER — FUROSEMIDE 40 MG PO TABS: ORAL_TABLET | ORAL | Status: DC

## 2012-07-02 NOTE — Progress Notes (Signed)
Patient ID: Sherry [name], female   DOB: 1945-11-25, 67 y.o.   MRN: [id number]  Subjective:   Patient ID: Sherry [name] female   DOB: [birth year] 67 y.o.   MRN: [id number]  HPI: [name] is a 67 y.o. woman with past medical history significant for HLD, HTN, seizure disorder comes to the clinic for worsening of ankle swelling.  1. B/L leg swelling. Patient has a history of chronic intermittent B/L ankle swelling. She reports worsening of ankle swelling for past week.  Denies chest pain, chest pressure, SOB. She states that she feels fine otherwise. She eats low salt diet, but admits that she drink up to a gallon of fluid daily.    No past medical history on file. Current Outpatient Prescriptions  Medication Sig Dispense Refill  . acetaminophen (TYLENOL) 325 MG tablet Take 2 tablets (650 mg total) by mouth every 6 (six) hours as needed for pain.  100 tablet  2  . amLODipine (NORVASC) 10 MG tablet TAKE ONE TABLET BY MOUTH ONE TIME DAILY  90 tablet  4  . aspirin 81 MG EC tablet Take 81 mg by mouth daily.        . Calcium-Vitamin D 600-200 MG-UNIT per tablet Take 2 tablets by mouth daily.        . furosemide (LASIX) 40 MG tablet Take 80 mg po in am and 40 mg po in pm.  90 tablet  5  . lisinopril (PRINIVIL,ZESTRIL) 40 MG tablet Take 1 tablet (40 mg total) by mouth daily.  30 tablet  11  . metoprolol (LOPRESSOR) 100 MG tablet Take 1 tablet (100 mg total) by mouth 2 (two) times daily.  60 tablet  11  . PHENobarbital (LUMINAL) 97.2 MG tablet Take 2 tablets at bedtime  180 tablet  2  . pravastatin (PRAVACHOL) 40 MG tablet TAKE ONE TABLET BY MOUTH ONE TIME DAILY  30 tablet  11  . spironolactone (ALDACTONE) 50 MG tablet Take 1 tablet (50 mg total) by mouth daily.  30 tablet  11   No current facility-administered medications for this visit.   No family history on file. History   Social History  . Marital Status: Married    Spouse Name: N/A    Number of Children: N/A  .  Years of Education: N/A   Social History Main Topics  . Smoking status: Former Smoker    Quit date: 06/03/2000  . Smokeless tobacco: Not on file  . Alcohol Use: No  . Drug Use: No  . Sexually Active: Not on file   Other Topics Concern  . Not on file   Social History Narrative  . No narrative on file   Review of Systems:  No headache, fever, or sore throat. No shortness of breath or dyspnea on exertion. No chest pain, chest pressure or palpitation No nausea, vomiting, or abdominal pain. No melena, diarrhea or incontinence. No muscle weakness.                   Denies depression. No appetite or weight changes.   Objective:  Physical Exam: Filed Vitals:   07/02/12 1739  BP: 162/93  Pulse: 52  General: alert, well-developed, and cooperative to examination.  Head: normocephalic and atraumatic.  Eyes: vision grossly intact, pupils equal, pupils round, pupils reactive to light, no injection and anicteric.  Mouth: pharynx pink and moist, no erythema, and no exudates.  Neck: supple, full ROM, no thyromegaly, no JVD, and no carotid bruits.  Lungs: normal  respiratory effort, no accessory muscle use, normal breath sounds, no crackles, and no wheezes. Heart: normal rate, regular rhythm, no murmur, no gallop, and no rub.  Abdomen: soft, non-tender, normal bowel sounds, no distention, no guarding, no rebound tenderness, no hepatomegaly, and no splenomegaly.  Msk: no joint swelling, no joint warmth, and no redness over joints. Pulses: 2+ DP/PT pulses bilaterally Extremities: No cyanosis or clubbing. B/L ankle 2-3 + pitting edema Neurologic: alert & oriented X3, cranial nerves II-XII intact, strength normal in all extremities, sensation intact to light touch, and gait normal. Generalized weakness especially at left sided.  Skin: turgor normal and no rashes. Sacral skin redness noted Psych: Oriented X3, memory intact for recent and remote, normally interactive, good eye contact, not anxious  appearing, and not depressed appearing.   Assessment & Plan:

## 2012-07-02 NOTE — Patient Instructions (Addendum)
1. Please limit your fluid intake to 1.5 to 2 L daily 2. Low salt diet 3. Please add additional lasix 40 mg daily pm and continue your lasix 80 daily am. 4. Please update me on Monday.

## 2012-07-02 NOTE — Assessment & Plan Note (Addendum)
Patient with a history of chronic LE edema, HTN, CKD and CVA presents with worsening of ankle edema. Denies SOB, CP/chest pressure. She admits drinking a gallon of fluid daily. Home meds include Lasix, Spirolactone, Norvasc, Lisinopril and metoprolol for HTN. Physical examination reveals Wt 233 Lbs ( 235 on 05/07/12), no JVD, B/L clear lung sounds clear. B/L ankle 2-3 + pitting edema. Of note, last echo in 2008 showed EF 75-80% with LVH.   - A/P The etiology of her chronic LE edema could be due to CCB Amlodipine versus veinous stasis. However, veinous stasis is usually associated with skin hyperpigmentation and non- pitting edema, both which are not noted on her examination.  The worsening of her ankle edema most likely associated with her excessive fluid intake  (1 gallon daily). However, we will need to rule out other etiologies including cardiac, liver or renal diseases.   CHF is unlikely since she has no other symptoms such as SOB or CP/pressure, and physical examination reveals no JVD and B/L lung sounds are clear. Her weight is actually down 2 lbs.  Given patient's history of HTN and CKD, will need to rule out renal/liver disease process.    --will obtain CMP, UA and urine microalbumin.  --patient is educated to avoid excess fluid intake and she should limit her fluid intake to 1.5-2 L daily - will increase her lasix dosage for more diuresis and better BP control ( BP 160's/90's)     I will add lasix 40 mg po daily pm to her regimen. And ask patient's daughter to update me in a few days.  --EKG performed given her bradycardia on examination.     EKG shows sinus bradycardia with 1st degree AV block. Leads V5, V6, V7 TWI (same as previous EKG). Leads II,III,AVF TWI (different).     When comparing to her previous EKG, the limbs leads are all different from previous EKG. I have discussed and reviewed EKG with [facility] cardiologist who think that LIMB LEAD are reversed. And they recommend to repeat EKG  at some point. And an echo is also recommended since last one was done in 2008. --will order 2 D echo

## 2012-07-03 ENCOUNTER — Other Ambulatory Visit: Payer: Self-pay | Admitting: Internal Medicine

## 2012-07-03 LAB — COMPLETE METABOLIC PANEL WITH GFR
ALT: 14 U/L (ref 0–35)
AST: 21 U/L (ref 0–37)
Alkaline Phosphatase: 65 U/L (ref 39–117)
CO2: 21 mEq/L (ref 19–32)
Creat: 1.69 mg/dL — ABNORMAL HIGH (ref 0.50–1.10)
GFR, Est African American: 36 mL/min — ABNORMAL LOW
Sodium: 135 mEq/L (ref 135–145)
Total Bilirubin: 0.3 mg/dL (ref 0.3–1.2)
Total Protein: 7.2 g/dL (ref 6.0–8.3)

## 2012-07-03 LAB — MICROALBUMIN / CREATININE URINE RATIO
Creatinine, Urine: 44.8 mg/dL
Microalb Creat Ratio: 31.3 mg/g — ABNORMAL HIGH (ref 0.0–30.0)
Microalb, Ur: 1.4 mg/dL (ref 0.00–1.89)

## 2012-07-03 LAB — URINALYSIS, MICROSCOPIC ONLY: Crystals: NONE SEEN

## 2012-07-03 LAB — URINALYSIS, ROUTINE W REFLEX MICROSCOPIC
Bilirubin Urine: NEGATIVE
Glucose, UA: NEGATIVE mg/dL
Ketones, ur: NEGATIVE mg/dL
Specific Gravity, Urine: 1.01 (ref 1.005–1.030)
Urobilinogen, UA: 0.2 mg/dL (ref 0.0–1.0)
pH: 6 (ref 5.0–8.0)

## 2012-07-06 NOTE — Progress Notes (Signed)
Case discussed with Dr. Nicoletta Dress soon after she evaluated the patient. We reviewed the resident's history and exam and pertinent patient test results. I agree with the assessment, diagnosis and plan of care documented in the resident's note.  LE edema likely secondary to a combination of chronic venous insufficiency and excessive fluid intake in the setting of underlying chronic kidney disease.  The history and exam does not support a cardiac etiology at this point.

## 2012-07-07 ENCOUNTER — Encounter: Payer: Self-pay | Admitting: Internal Medicine

## 2012-07-08 ENCOUNTER — Encounter: Payer: Self-pay | Admitting: Internal Medicine

## 2012-07-09 ENCOUNTER — Other Ambulatory Visit: Payer: Self-pay | Admitting: Internal Medicine

## 2012-07-09 DIAGNOSIS — R609 Edema, unspecified: Secondary | ICD-10-CM

## 2012-07-17 ENCOUNTER — Ambulatory Visit (HOSPITAL_COMMUNITY)
Admission: RE | Admit: 2012-07-17 | Discharge: 2012-07-17 | Disposition: A | Discharge status: Home / Self Care | Payer: Medicare Other | Source: Ambulatory Visit | Attending: Internal Medicine | Admitting: Internal Medicine

## 2012-07-17 DIAGNOSIS — E785 Hyperlipidemia, unspecified: Secondary | ICD-10-CM | POA: Insufficient documentation

## 2012-07-17 DIAGNOSIS — Z87891 Personal history of nicotine dependence: Secondary | ICD-10-CM | POA: Insufficient documentation

## 2012-07-17 DIAGNOSIS — I319 Disease of pericardium, unspecified: Secondary | ICD-10-CM

## 2012-07-17 DIAGNOSIS — Z6841 Body Mass Index (BMI) 40.0 and over, adult: Secondary | ICD-10-CM | POA: Insufficient documentation

## 2012-07-17 DIAGNOSIS — I10 Essential (primary) hypertension: Secondary | ICD-10-CM | POA: Insufficient documentation

## 2012-07-17 DIAGNOSIS — R609 Edema, unspecified: Secondary | ICD-10-CM

## 2012-07-17 NOTE — Progress Notes (Signed)
  Echocardiogram 2D Echocardiogram has been performed.  Sherry [name] FRANCES 07/17/2012, 4:27 PM

## 2012-07-19 ENCOUNTER — Encounter: Payer: Self-pay | Admitting: Internal Medicine

## 2012-07-22 ENCOUNTER — Encounter: Payer: Self-pay | Admitting: Internal Medicine

## 2012-07-23 ENCOUNTER — Encounter: Payer: Self-pay | Admitting: Internal Medicine

## 2012-07-26 ENCOUNTER — Encounter: Payer: Self-pay | Admitting: Internal Medicine

## 2012-07-27 ENCOUNTER — Encounter: Payer: Self-pay | Admitting: Internal Medicine

## 2012-08-04 ENCOUNTER — Encounter: Payer: Self-pay | Admitting: Internal Medicine

## 2012-08-14 ENCOUNTER — Encounter: Payer: Self-pay | Admitting: Internal Medicine

## 2012-08-17 ENCOUNTER — Encounter: Payer: Self-pay | Admitting: Internal Medicine

## 2012-08-20 ENCOUNTER — Encounter: Payer: Self-pay | Admitting: Internal Medicine

## 2012-09-10 ENCOUNTER — Other Ambulatory Visit: Payer: Self-pay | Admitting: *Deleted

## 2012-09-10 DIAGNOSIS — R569 Unspecified convulsions: Secondary | ICD-10-CM

## 2012-09-10 MED ORDER — METOPROLOL TARTRATE 100 MG PO TABS: 100.0000 mg | ORAL_TABLET | Freq: Two times a day (BID) | ORAL | Status: DC

## 2012-09-10 NOTE — Telephone Encounter (Signed)
i rec'd a call back from pt, she will call [facility] neuro about her phenobarb, she had forgotten that the md at [facility] needed to fill

## 2012-09-10 NOTE — Telephone Encounter (Signed)
It appears that phenobarbitol 60mg  three tablets every evening was refilled by her physician at [facility] on 7/21; please clarify this and route back to attending in-basket pool.

## 2012-09-10 NOTE — Telephone Encounter (Signed)
Tried to call pt, left vmail for return call

## 2012-09-14 ENCOUNTER — Encounter: Payer: Self-pay | Admitting: Internal Medicine

## 2012-09-14 ENCOUNTER — Telehealth: Payer: Self-pay | Admitting: Internal Medicine

## 2012-09-14 DIAGNOSIS — I350 Nonrheumatic aortic (valve) stenosis: Secondary | ICD-10-CM

## 2012-09-14 NOTE — Telephone Encounter (Signed)
  INTERNAL MEDICINE RESIDENCY PROGRAM After-Hours Telephone Call    Reason for call:   I placed an outgoing call to [name] 's daughter Sherry [name] at 1200 noon regarding a cardiology referral. Discussed with [name] about her Echo result in June 2014. However, patient was not able to grasp the concept of echo result and would like her daughter to talk to me. She was not unable to provide her daughter Sherry [name] phone number to me in June 2014.   I discussed the echo result with patient's daughter. A moderate aortic stenosis was noted on her Echo report. The [facility] cardiologist Dr. Einar Crow who read the report suggesting "TEE or further TTE images to make sure stenosis is not severe."      Assessment/ Plan:   I feel that a cardiology referral is needed given patient's co mobilities. I discussed with patient's daughter Sherry [name] who agrees with a cardiology consult and prefers for her mother not to going through any additional invasive procedure.   I will order a cardiology consult.  I will route this message for my nurse Mamie to arrange the appointment since her daughter is only available on Monday.   As always, pt is advised that if symptoms worsen or new symptoms arise, they should go to an urgent care facility or to to ER for further evaluation.    Charlann Lange, MD   09/14/2012, 12:14 PM

## 2012-10-06 ENCOUNTER — Encounter: Payer: Self-pay | Admitting: Internal Medicine

## 2012-10-15 ENCOUNTER — Encounter: Payer: Self-pay | Admitting: Internal Medicine

## 2012-10-16 ENCOUNTER — Encounter: Payer: Self-pay | Admitting: Cardiovascular Disease

## 2012-10-16 ENCOUNTER — Ambulatory Visit (INDEPENDENT_AMBULATORY_CARE_PROVIDER_SITE_OTHER): Payer: Medicare Other | Admitting: Cardiovascular Disease

## 2012-10-16 ENCOUNTER — Encounter: Payer: Self-pay | Admitting: Internal Medicine

## 2012-10-16 VITALS — BP 146/84 | HR 65 | Ht 61.0 in | Wt 229.0 lb

## 2012-10-16 DIAGNOSIS — I359 Nonrheumatic aortic valve disorder, unspecified: Secondary | ICD-10-CM

## 2012-10-16 DIAGNOSIS — I350 Nonrheumatic aortic (valve) stenosis: Secondary | ICD-10-CM

## 2012-10-16 NOTE — Patient Instructions (Addendum)
Your physician recommends that you continue on your current medications as directed. Please refer to the Current Medication list given to you today.  Your physician wants you to follow-up in: 6 MONTHS with Dr Burt Knack.  You will receive a reminder letter in the mail two months in advance. If you don't receive a letter, please call our office to schedule the follow-up appointment.  Your physician has requested that you have an echocardiogram in 6 MONTHS. Echocardiography is a painless test that uses sound waves to create images of your heart. It provides your doctor with information about the size and shape of your heart and how well your heart's chambers and valves are working. This procedure takes approximately one hour. There are no restrictions for this procedure.

## 2012-10-19 ENCOUNTER — Encounter: Payer: Self-pay | Admitting: Cardiovascular Disease

## 2012-10-19 NOTE — Progress Notes (Signed)
HPI:  This is a 67 year old woman referred for evaluation of aortic stenosis. The patient has been in very poor health for many years now. She is essentially nonambulatory due to generalized weakness. She had previously used a walker but now is in a wheelchair much of the time. She has struggled with chronic leg swelling. Other comorbid conditions include hypertension, chronic kidney disease, and seizure disorder. Congestive heart failure has been considered in the differential diagnosis of her leg swelling and an echocardiogram was ordered. This demonstrated a severely calcified aortic valve with moderate aortic stenosis by Doppler criteria. The aortic valve leaflets appeared severely restricted.  The patient complains of chronic leg swelling. She fell recently and injured her right leg which has been more edematous than the left since the fall. This occurred about a week ago and she notes this is improving. She's had occasional fleeting chest pains with no recent change in pattern. These occur in the epigastrium and feels sharp. She does have shortness of breath with exertion and this is long-standing. She also complains of palpitations.  Outpatient Encounter Prescriptions as of 10/16/2012  Medication Sig Dispense Refill  . amLODipine (NORVASC) 10 MG tablet TAKE ONE TABLET BY MOUTH ONE TIME DAILY  90 tablet  4  . aspirin 81 MG EC tablet Take 81 mg by mouth daily.        . Calcium-Vitamin D 600-200 MG-UNIT per tablet Take 2 tablets by mouth daily.        . furosemide (LASIX) 40 MG tablet Take 80 mg po in am and 40 mg po in pm.  90 tablet  5  . lisinopril (PRINIVIL,ZESTRIL) 40 MG tablet Take 1 tablet (40 mg total) by mouth daily.  30 tablet  11  . metoprolol (LOPRESSOR) 100 MG tablet Take 1 tablet (100 mg total) by mouth 2 (two) times daily.  180 tablet  1  . PHENObarbital (LUMINAL) 60 MG tablet 180 mg. Take 3 tablets (180 mg total) by mouth every evening.      [name] PHENobarbital (LUMINAL) 97.2 MG  tablet Take 2 tablets at bedtime  180 tablet  2  . pravastatin (PRAVACHOL) 40 MG tablet TAKE ONE TABLET BY MOUTH ONE TIME DAILY  30 tablet  11  . spironolactone (ALDACTONE) 50 MG tablet Take 1 tablet (50 mg total) by mouth daily.  30 tablet  11  . zonisamide (ZONEGRAN) 100 MG capsule 200 mg. Take 2 capsules (200 mg total) by mouth every evening.       No facility-administered encounter medications on file as of 10/16/2012.    Review of patient's allergies indicates no known allergies.  History reviewed. No pertinent past medical history.  History reviewed. No pertinent past surgical history.  History   Social History  . Marital Status: Married    Spouse Name: N/A    Number of Children: N/A  . Years of Education: N/A   Occupational History  . Not on file.   Social History Main Topics  . Smoking status: Former Smoker    Quit date: 06/03/2000  . Smokeless tobacco: Not on file  . Alcohol Use: No  . Drug Use: No  . Sexual Activity: Not on file   Other Topics Concern  . Not on file   Social History Narrative  . No narrative on file   Family history: Negative for premature coronary artery disease  ROS:  General: no fevers/chills/night sweats Eyes: no blurry vision, diplopia, or amaurosis ENT: no sore throat or hearing loss  Resp: no cough, wheezing, or hemoptysis CV: See history of present illness GI: no abdominal pain, nausea, vomiting, diarrhea, or constipation GU: no dysuria, frequency, or hematuria Skin: no rash Neuro: no headache, numbness, tingling, or weakness of extremities, positive for previous stroke Musculoskeletal: Positive for diffuse joint pains Heme: no bleeding, DVT, or easy bruising Endo: no polydipsia or polyuria  BP 146/84  Pulse 65  Ht 5\' 1"  (1.549 m)  Wt 229 lb (103.874 kg)  BMI 43.29 kg/m2  SpO2 98%  PHYSICAL EXAM: Pt is alert and oriented, chronically ill-appearing, pleasant obese woman in no acute distress HEENT: normal Neck: JVP  normal. Carotid upstrokes normal with bilateral bruits. No thyromegaly. Lungs: equal expansion, clear bilaterally CV: Apex is discrete and nondisplaced, RRR with grade 3/6 harsh systolic crescendo decrescendo murmur at the right upper sternal border, aortic closure sounds are diminished but remain present Abd: soft, NT, +BS, no bruit, no hepatosplenomegaly Back: no CVA tenderness Ext: 2+ pretibial edema with stasis dermatitis on the right, 1+ pretibial edema on the left Neuro: CNII-XII intact             The patient is alert and oriented, good insight   EKG:  Sinus rhythm 65 beats per minute, marked sinus arrhythmia, left ventricular hypertrophy with repolarization abnormality.  2-D echocardiogram 07/17/2012: Left ventricle: The cavity size was normal. Wall thickness was increased in a pattern of moderate LVH. Systolic function was vigorous. The estimated ejection fraction was in the range of 65% to 70%. Wall motion was normal; there were no regional wall motion abnormalities. Doppler parameters are consistent with abnormal left ventricular relaxation (grade 1 diastolic dysfunction).  ------------------------------------------------------------ Aortic valve: Probably trileaflet; moderately calcified leaflets. Doppler: There was moderate stenosis. No regurgitation. VTI ratio of LVOT to aortic valve: 0.38. Valve area: 1.08cm^2(VTI). Indexed valve area: 0.49cm^2/m^2 (VTI). Peak velocity ratio of LVOT to aortic valve: 0.37. Valve area: 1.04cm^2 (Vmax). Indexed valve area: 0.47cm^2/m^2 (Vmax). Mean gradient: 13mm Hg (S). Peak gradient:73mm Hg (S).  ------------------------------------------------------------ Aorta: Aortic root: The aortic root was normal in size. Ascending aorta: The ascending aorta was normal in size.  ------------------------------------------------------------ Mitral valve: Moderately calcified annulus. Doppler: There was no evidence for stenosis.  Trivial regurgitation. Peak gradient: 33mm Hg (D).  ------------------------------------------------------------ Left atrium: The atrium was mildly dilated.  ------------------------------------------------------------ Right ventricle: The cavity size was normal. Systolic function was normal.  ------------------------------------------------------------ Pulmonic valve: Structurally normal valve. Cusp separation was normal. Doppler: Transvalvular velocity was within the normal range. No regurgitation.  ------------------------------------------------------------ Tricuspid valve: Doppler: No significant regurgitation.  ------------------------------------------------------------ Pulmonary artery: No complete TR doppler jet so unable to estimate PA systolic pressure.  ------------------------------------------------------------ Right atrium: The atrium was normal in size.  ------------------------------------------------------------ Pericardium: Small circumferential pericardial effusion.  ------------------------------------------------------------ Systemic veins: Inferior vena cava: The vessel was normal in size; the respirophasic diameter changes were in the normal range (= 50%); findings are consistent with normal central venous pressure.  ASSESSMENT AND PLAN: 1. Aortic stenosis. This is in the moderate to severe range, but echo hemodynamics suggest moderate aortic stenosis. Recommend conservative management. The patient is in very poor general medical health and is not a good candidate for aortic valve replacement even if her aortic stenosis progresses. I will plan on repeating an echocardiogram in 6 months and will review further at that time. As her aortic valve disease progresses, we will have to consider options of palliative medical therapy versus valve replacement. At the present time I think continued observation is appropriate as her aortic stenosis  is only in the  moderate range.  2. Chronic diastolic heart failure. I'm certain that diastolic heart failure is contributing to her chronic edema. However, immobility and obesity are also playing a role here. She should continue on furosemide 80 mg twice daily. She has significant left ventricular hypertrophy on echo and this is probably related to aortic stenosis and hypertension.  3. Hypertension. Reasonable control on amlodipine, furosemide, lisinopril, metoprolol, and Aldactone.  I appreciate the opportunity to see this nice lady. I will see her back in 6 months after her echocardiogram is completed.  Sherren Mocha 10/19/2012 11:07 AM

## 2012-10-20 ENCOUNTER — Other Ambulatory Visit: Payer: Self-pay | Admitting: Internal Medicine

## 2012-10-20 DIAGNOSIS — R269 Unspecified abnormalities of gait and mobility: Secondary | ICD-10-CM

## 2012-10-22 ENCOUNTER — Encounter: Payer: Self-pay | Admitting: Internal Medicine

## 2012-10-30 ENCOUNTER — Inpatient Hospital Stay (HOSPITAL_COMMUNITY)
Admission: EM | Admit: 2012-10-30 | Discharge: 2012-11-02 | DRG: 593 | Disposition: A | Discharge status: Skilled Nursing Facility | Payer: Medicare Other | Attending: Internal Medicine | Admitting: Internal Medicine

## 2012-10-30 ENCOUNTER — Emergency Department (HOSPITAL_COMMUNITY): Payer: Medicare Other

## 2012-10-30 ENCOUNTER — Encounter (HOSPITAL_COMMUNITY): Payer: Self-pay | Admitting: *Deleted

## 2012-10-30 DIAGNOSIS — E669 Obesity, unspecified: Secondary | ICD-10-CM | POA: Diagnosis present

## 2012-10-30 DIAGNOSIS — N184 Chronic kidney disease, stage 4 (severe): Secondary | ICD-10-CM | POA: Diagnosis present

## 2012-10-30 DIAGNOSIS — L0291 Cutaneous abscess, unspecified: Secondary | ICD-10-CM

## 2012-10-30 DIAGNOSIS — Z8673 Personal history of transient ischemic attack (TIA), and cerebral infarction without residual deficits: Secondary | ICD-10-CM

## 2012-10-30 DIAGNOSIS — L97909 Non-pressure chronic ulcer of unspecified part of unspecified lower leg with unspecified severity: Principal | ICD-10-CM | POA: Diagnosis present

## 2012-10-30 DIAGNOSIS — I10 Essential (primary) hypertension: Secondary | ICD-10-CM | POA: Diagnosis present

## 2012-10-30 DIAGNOSIS — E785 Hyperlipidemia, unspecified: Secondary | ICD-10-CM | POA: Diagnosis present

## 2012-10-30 DIAGNOSIS — I739 Peripheral vascular disease, unspecified: Secondary | ICD-10-CM | POA: Diagnosis present

## 2012-10-30 DIAGNOSIS — N183 Chronic kidney disease, stage 3 unspecified: Secondary | ICD-10-CM | POA: Diagnosis present

## 2012-10-30 DIAGNOSIS — R413 Other amnesia: Secondary | ICD-10-CM | POA: Diagnosis present

## 2012-10-30 DIAGNOSIS — L0390 Cellulitis, unspecified: Secondary | ICD-10-CM

## 2012-10-30 DIAGNOSIS — Z87891 Personal history of nicotine dependence: Secondary | ICD-10-CM

## 2012-10-30 DIAGNOSIS — N179 Acute kidney failure, unspecified: Secondary | ICD-10-CM | POA: Diagnosis present

## 2012-10-30 DIAGNOSIS — I359 Nonrheumatic aortic valve disorder, unspecified: Secondary | ICD-10-CM | POA: Diagnosis present

## 2012-10-30 DIAGNOSIS — Z23 Encounter for immunization: Secondary | ICD-10-CM

## 2012-10-30 DIAGNOSIS — R269 Unspecified abnormalities of gait and mobility: Secondary | ICD-10-CM

## 2012-10-30 DIAGNOSIS — I5030 Unspecified diastolic (congestive) heart failure: Secondary | ICD-10-CM | POA: Diagnosis present

## 2012-10-30 DIAGNOSIS — I129 Hypertensive chronic kidney disease with stage 1 through stage 4 chronic kidney disease, or unspecified chronic kidney disease: Secondary | ICD-10-CM | POA: Diagnosis present

## 2012-10-30 DIAGNOSIS — Z7982 Long term (current) use of aspirin: Secondary | ICD-10-CM

## 2012-10-30 DIAGNOSIS — L02419 Cutaneous abscess of limb, unspecified: Secondary | ICD-10-CM | POA: Diagnosis present

## 2012-10-30 DIAGNOSIS — N189 Chronic kidney disease, unspecified: Secondary | ICD-10-CM | POA: Diagnosis present

## 2012-10-30 DIAGNOSIS — G40909 Epilepsy, unspecified, not intractable, without status epilepticus: Secondary | ICD-10-CM | POA: Diagnosis present

## 2012-10-30 DIAGNOSIS — I509 Heart failure, unspecified: Secondary | ICD-10-CM | POA: Diagnosis present

## 2012-10-30 DIAGNOSIS — L03115 Cellulitis of right lower limb: Secondary | ICD-10-CM | POA: Diagnosis present

## 2012-10-30 DIAGNOSIS — M79609 Pain in unspecified limb: Secondary | ICD-10-CM

## 2012-10-30 DIAGNOSIS — Z6841 Body Mass Index (BMI) 40.0 and over, adult: Secondary | ICD-10-CM

## 2012-10-30 DIAGNOSIS — Z79899 Other long term (current) drug therapy: Secondary | ICD-10-CM

## 2012-10-30 LAB — CBC
HCT: 34 % — ABNORMAL LOW (ref 36.0–46.0)
Hemoglobin: 11.5 g/dL — ABNORMAL LOW (ref 12.0–15.0)
MCV: 86.5 fL (ref 78.0–100.0)
WBC: 6.5 10*3/uL (ref 4.0–10.5)

## 2012-10-30 LAB — BASIC METABOLIC PANEL
BUN: 41 mg/dL — ABNORMAL HIGH (ref 6–23)
Chloride: 101 mEq/L (ref 96–112)
Creatinine, Ser: 1.66 mg/dL — ABNORMAL HIGH (ref 0.50–1.10)
Glucose, Bld: 135 mg/dL — ABNORMAL HIGH (ref 70–99)
Potassium: 3.8 mEq/L (ref 3.5–5.1)

## 2012-10-30 LAB — GLUCOSE, CAPILLARY

## 2012-10-30 MED ORDER — ONDANSETRON HCL 4 MG/2ML IJ SOLN
4.0000 mg | Freq: Once | INTRAMUSCULAR | Status: AC
  Administered 2012-10-30: 4 mg via INTRAVENOUS
  Filled 2012-10-30: qty 2

## 2012-10-30 MED ORDER — AMLODIPINE BESYLATE 10 MG PO TABS
10.0000 mg | ORAL_TABLET | Freq: Every day | ORAL | Status: DC
  Administered 2012-10-30 – 2012-11-02 (×4): 10 mg via ORAL
  Filled 2012-10-30 (×4): qty 1

## 2012-10-30 MED ORDER — HYDROCODONE-ACETAMINOPHEN 5-325 MG PO TABS
1.0000 | ORAL_TABLET | ORAL | Status: DC | PRN
  Administered 2012-10-31 – 2012-11-01 (×8): 1 via ORAL
  Administered 2012-11-01 (×3): 2 via ORAL
  Administered 2012-11-02 (×5): 1 via ORAL
  Filled 2012-10-30: qty 1
  Filled 2012-10-30: qty 2
  Filled 2012-10-30 (×7): qty 1
  Filled 2012-10-30 (×2): qty 2
  Filled 2012-10-30 (×5): qty 1

## 2012-10-30 MED ORDER — ACETAMINOPHEN 650 MG RE SUPP: 650.0000 mg | Freq: Four times a day (QID) | RECTAL | Status: DC | PRN

## 2012-10-30 MED ORDER — ASPIRIN EC 81 MG PO TBEC
81.0000 mg | DELAYED_RELEASE_TABLET | Freq: Every day | ORAL | Status: DC
  Administered 2012-10-31 – 2012-11-02 (×3): 81 mg via ORAL
  Filled 2012-10-30 (×4): qty 1

## 2012-10-30 MED ORDER — CALCIUM-VITAMIN D 600-200 MG-UNIT PO TABS: 2.0000 | ORAL_TABLET | Freq: Every day | ORAL | Status: DC

## 2012-10-30 MED ORDER — SODIUM CHLORIDE 0.9 % IV BOLUS (SEPSIS): 500.0000 mL | Freq: Once | INTRAVENOUS | Status: AC

## 2012-10-30 MED ORDER — SODIUM CHLORIDE 0.9 % IV BOLUS (SEPSIS)
500.0000 mL | Freq: Once | INTRAVENOUS | Status: AC
  Administered 2012-10-30: 500 mL via INTRAVENOUS

## 2012-10-30 MED ORDER — SODIUM CHLORIDE 0.9 % IV SOLN
INTRAVENOUS | Status: DC
  Administered 2012-10-30: 21:00:00 via INTRAVENOUS

## 2012-10-30 MED ORDER — ZONISAMIDE 100 MG PO CAPS
200.0000 mg | ORAL_CAPSULE | Freq: Every day | ORAL | Status: DC
  Administered 2012-10-30 – 2012-11-01 (×3): 200 mg via ORAL
  Filled 2012-10-30 (×5): qty 2

## 2012-10-30 MED ORDER — SODIUM CHLORIDE 0.9 % IJ SOLN
3.0000 mL | Freq: Two times a day (BID) | INTRAMUSCULAR | Status: DC
  Administered 2012-10-31 – 2012-11-02 (×4): 3 mL via INTRAVENOUS

## 2012-10-30 MED ORDER — HEPARIN SODIUM (PORCINE) 5000 UNIT/ML IJ SOLN
5000.0000 [IU] | Freq: Three times a day (TID) | INTRAMUSCULAR | Status: DC
  Administered 2012-10-30 – 2012-11-02 (×9): 5000 [IU] via SUBCUTANEOUS
  Filled 2012-10-30 (×12): qty 1

## 2012-10-30 MED ORDER — INFLUENZA VAC SPLIT QUAD 0.5 ML IM SUSP
0.5000 mL | INTRAMUSCULAR | Status: AC
  Administered 2012-10-31: 0.5 mL via INTRAMUSCULAR
  Filled 2012-10-30: qty 0.5

## 2012-10-30 MED ORDER — CALCIUM CARBONATE-VITAMIN D 500-200 MG-UNIT PO TABS
2.0000 | ORAL_TABLET | Freq: Every day | ORAL | Status: DC
  Administered 2012-10-31 – 2012-11-02 (×3): 2 via ORAL
  Filled 2012-10-30 (×5): qty 2

## 2012-10-30 MED ORDER — VANCOMYCIN HCL IN DEXTROSE 1-5 GM/200ML-% IV SOLN
1000.0000 mg | Freq: Once | INTRAVENOUS | Status: AC
  Administered 2012-10-30: 1000 mg via INTRAVENOUS
  Filled 2012-10-30: qty 200

## 2012-10-30 MED ORDER — CLINDAMYCIN PHOSPHATE 600 MG/50ML IV SOLN
600.0000 mg | Freq: Once | INTRAVENOUS | Status: AC
  Administered 2012-10-30: 600 mg via INTRAVENOUS
  Filled 2012-10-30: qty 50

## 2012-10-30 MED ORDER — ASPIRIN 81 MG PO CHEW
CHEWABLE_TABLET | ORAL | Status: AC
  Administered 2012-10-30: 81 mg
  Filled 2012-10-30: qty 1

## 2012-10-30 MED ORDER — ACETAMINOPHEN 500 MG PO TABS
1000.0000 mg | ORAL_TABLET | Freq: Once | ORAL | Status: AC
  Administered 2012-10-30: 1000 mg via ORAL
  Filled 2012-10-30: qty 2

## 2012-10-30 MED ORDER — METOPROLOL TARTRATE 100 MG PO TABS
100.0000 mg | ORAL_TABLET | Freq: Two times a day (BID) | ORAL | Status: DC
  Administered 2012-10-31 – 2012-11-02 (×5): 100 mg via ORAL
  Filled 2012-10-30 (×7): qty 1

## 2012-10-30 MED ORDER — PNEUMOCOCCAL VAC POLYVALENT 25 MCG/0.5ML IJ INJ
0.5000 mL | INJECTION | INTRAMUSCULAR | Status: AC
  Administered 2012-10-31: 0.5 mL via INTRAMUSCULAR
  Filled 2012-10-30: qty 0.5

## 2012-10-30 MED ORDER — PHENOBARBITAL 60 MG PO TABS
180.0000 mg | ORAL_TABLET | Freq: Every day | ORAL | Status: DC
  Filled 2012-10-30: qty 3

## 2012-10-30 MED ORDER — MORPHINE SULFATE 4 MG/ML IJ SOLN
4.0000 mg | Freq: Once | INTRAMUSCULAR | Status: AC
  Administered 2012-10-30: 4 mg via INTRAVENOUS
  Filled 2012-10-30: qty 1

## 2012-10-30 MED ORDER — FUROSEMIDE 40 MG PO TABS
40.0000 mg | ORAL_TABLET | Freq: Every day | ORAL | Status: DC
  Administered 2012-10-31 – 2012-11-01 (×2): 40 mg via ORAL
  Filled 2012-10-30 (×2): qty 1

## 2012-10-30 MED ORDER — LISINOPRIL 40 MG PO TABS
40.0000 mg | ORAL_TABLET | Freq: Every day | ORAL | Status: DC
  Administered 2012-10-31 – 2012-11-02 (×3): 40 mg via ORAL
  Filled 2012-10-30 (×3): qty 1

## 2012-10-30 MED ORDER — SPIRONOLACTONE 50 MG PO TABS
50.0000 mg | ORAL_TABLET | Freq: Every day | ORAL | Status: DC
  Administered 2012-10-30 – 2012-11-02 (×4): 50 mg via ORAL
  Filled 2012-10-30 (×4): qty 1

## 2012-10-30 MED ORDER — ACETAMINOPHEN 325 MG PO TABS
650.0000 mg | ORAL_TABLET | Freq: Four times a day (QID) | ORAL | Status: DC | PRN
  Administered 2012-11-01 – 2012-11-02 (×2): 650 mg via ORAL
  Filled 2012-10-30 (×2): qty 2

## 2012-10-30 MED ORDER — PHENOBARBITAL 32.4 MG PO TABS
194.4000 mg | ORAL_TABLET | Freq: Two times a day (BID) | ORAL | Status: DC
  Administered 2012-10-30 – 2012-10-31 (×2): 194.4 mg via ORAL
  Filled 2012-10-30 (×3): qty 6

## 2012-10-30 MED ORDER — SIMVASTATIN 20 MG PO TABS
20.0000 mg | ORAL_TABLET | Freq: Every day | ORAL | Status: DC
  Administered 2012-10-31 – 2012-11-01 (×2): 20 mg via ORAL
  Filled 2012-10-30 (×3): qty 1

## 2012-10-30 NOTE — ED Notes (Signed)
Patient transported to X-ray 

## 2012-10-30 NOTE — Progress Notes (Signed)
*  PRELIMINARY RESULTS* Vascular Ultrasound Right lower extremity venous duplex has been completed.  Preliminary findings: negative for DVT and baker's cyst.   Landry Mellow, RDMS, RVT  10/30/2012, 7:21 PM

## 2012-10-30 NOTE — H&P (Signed)
Date: 10/30/2012               Patient Name:  Sherry [name] MRN: [id number]  DOB: [birth year] Age / Sex: 67 y.o., female   PCP: Charlann Lange, MD         Medical Service: Internal Medicine Teaching Service         Attending Physician: Dr. Mirna Mires, MD    First Contact: Dr. Lesly Dukes Pager: [phone]  Second Contact: Dr. Jannette Fogo Pager: [phone]       After Hours (After 5p/  First Contact Pager: [phone]  weekends / holidays): Second Contact Pager: [phone]   Chief Complaint: Right leg wound  History of Present Illness: Sherry [name] is a 67 y.o. female PMH aortic stenosis, dCHF, HLD, HTN, PVD, CVA, seizure disorder who presents with a chief complaint of right leg wound.  She lost her balance and fell out of bed 2.5 weeks ago, hitting her right shin on her rolling walker. This led to a simple bruise. She was seen be her cardiologist a few days later on 10/16/12. At that time there was no skin breakdown. He told her to keep an eye on it and go to the ED if it became more red or swollen. Over the next few weeks, she states the skin changed color and eventually broke down. She put hydrogen peroxide and bandages on it, but it continued to worsen. It eventually became red, swollen, and warm. Last week it started to became painful. Today, she could not bear weight on the leg 2/2 pain and decided to come to the ED. She endorses decreased po intake over the past couple days 2/2 pain. She denies fevers, chills, chest pain, SOB, abdominal pain, nausea, vomiting, diarrhea. She notes the calf is tender. This has never happened to her before. No history of diabetes.   Current Outpatient Prescriptions on File Prior to Encounter  Medication Sig Dispense Refill  . amLODipine (NORVASC) 10 MG tablet TAKE ONE TABLET BY MOUTH ONE TIME DAILY  90 tablet  4  . aspirin 81 MG EC tablet Take 81 mg by mouth daily.        . Calcium-Vitamin D 600-200 MG-UNIT per tablet Take 2 tablets by mouth daily.        .  furosemide (LASIX) 40 MG tablet Take 80 mg po in am and 40 mg po in pm.  90 tablet  5  . lisinopril (PRINIVIL,ZESTRIL) 40 MG tablet Take 1 tablet (40 mg total) by mouth daily.  30 tablet  11  . metoprolol (LOPRESSOR) 100 MG tablet Take 1 tablet (100 mg total) by mouth 2 (two) times daily.  180 tablet  1  . PHENObarbital (LUMINAL) 60 MG tablet 180 mg. Take 3 tablets (180 mg total) by mouth every evening.      [name] PHENobarbital (LUMINAL) 97.2 MG tablet Take 2 tablets at bedtime  180 tablet  2  . pravastatin (PRAVACHOL) 40 MG tablet TAKE ONE TABLET BY MOUTH ONE TIME DAILY  30 tablet  11  . spironolactone (ALDACTONE) 50 MG tablet Take 1 tablet (50 mg total) by mouth daily.  30 tablet  11  . zonisamide (ZONEGRAN) 100 MG capsule 200 mg. Take 2 capsules (200 mg total) by mouth every evening.        Meds: Current Facility-Administered Medications  Medication Dose Route Frequency Provider Last Rate Last Dose  . 0.9 %  sodium chloride infusion   Intravenous Continuous Jeralene Huff, MD      .  acetaminophen (TYLENOL) tablet 650 mg  650 mg Oral Q6H PRN Jeralene Huff, MD       Or  . acetaminophen (TYLENOL) suppository 650 mg  650 mg Rectal Q6H PRN Jeralene Huff, MD      . amLODipine (NORVASC) tablet 10 mg  10 mg Oral Daily Jeralene Huff, MD      . aspirin EC tablet 81 mg  81 mg Oral Daily Jeralene Huff, MD      . Calcium-Vitamin D 600-200 MG-UNIT 2 tablet  2 tablet Oral Daily Jeralene Huff, MD      . clindamycin (CLEOCIN) IVPB 600 mg  600 mg Intravenous Once Joanell Rising, MD 100 mL/hr at 10/30/12 1848 600 mg at 10/30/12 1848  . furosemide (LASIX) tablet 40 mg  40 mg Oral Daily Jeralene Huff, MD      . HYDROcodone-acetaminophen (NORCO/VICODIN) 5-325 MG per tablet 1-2 tablet  1-2 tablet Oral Q4H PRN Jeralene Huff, MD      . lisinopril (PRINIVIL,ZESTRIL) tablet 40 mg  40 mg Oral Daily Jeralene Huff, MD      .  spironolactone (ALDACTONE) tablet 50 mg  50 mg Oral Daily Jeralene Huff, MD      . vancomycin (VANCOCIN) IVPB 1000 mg/200 mL premix  1,000 mg Intravenous Once Mirna Mires, MD       Current Outpatient Prescriptions  Medication Sig Dispense Refill  . amLODipine (NORVASC) 10 MG tablet TAKE ONE TABLET BY MOUTH ONE TIME DAILY  90 tablet  4  . aspirin 81 MG EC tablet Take 81 mg by mouth daily.        . Calcium-Vitamin D 600-200 MG-UNIT per tablet Take 2 tablets by mouth daily.        . furosemide (LASIX) 40 MG tablet Take 80 mg po in am and 40 mg po in pm.  90 tablet  5  . lisinopril (PRINIVIL,ZESTRIL) 40 MG tablet Take 1 tablet (40 mg total) by mouth daily.  30 tablet  11  . metoprolol (LOPRESSOR) 100 MG tablet Take 1 tablet (100 mg total) by mouth 2 (two) times daily.  180 tablet  1  . PHENObarbital (LUMINAL) 60 MG tablet 180 mg. Take 3 tablets (180 mg total) by mouth every evening.      [name] PHENobarbital (LUMINAL) 97.2 MG tablet Take 2 tablets at bedtime  180 tablet  2  . pravastatin (PRAVACHOL) 40 MG tablet TAKE ONE TABLET BY MOUTH ONE TIME DAILY  30 tablet  11  . spironolactone (ALDACTONE) 50 MG tablet Take 1 tablet (50 mg total) by mouth daily.  30 tablet  11  . zonisamide (ZONEGRAN) 100 MG capsule 200 mg. Take 2 capsules (200 mg total) by mouth every evening.        Allergies: Allergies as of 10/30/2012  . (No Known Allergies)   Past Medical History  Diagnosis Date  . Aortic stenosis   . Hypertension   . Seizure disorder   . Hyperlipidemia    History reviewed. No pertinent past surgical history. No family history on file. History   Social History  . Marital Status: Married    Spouse Name: N/A    Number of Children: N/A  . Years of Education: N/A   Occupational History  . Not on file.   Social History Main Topics  . Smoking status: Former Smoker    Quit date: 06/03/2000  . Smokeless tobacco: Not on file  . Alcohol Use: No  .  Drug Use: No  . Sexual Activity:  Not on file   Other Topics Concern  . Not on file   Social History Narrative  . No narrative on file    Review of Systems: Pertinent items are noted in HPI.  Physical Exam: Blood pressure 156/66, pulse 89, temperature 102.6 F (39.2 C), temperature source Rectal, resp. rate 19, SpO2 97.00%. Physical Exam  Constitutional: She is oriented to person, place, and time and well-developed, well-nourished, and in no distress.  HENT:  Head: Normocephalic and atraumatic.  Eyes: Conjunctivae and EOM are normal. Pupils are equal, round, and reactive to light.  Neck: Normal range of motion. Neck supple.  Cardiovascular: Normal rate, regular rhythm and intact distal pulses.  Exam reveals no gallop and no friction rub.   Murmur (3/6 harsh crescendo-descrendo murmur best heard @ RUSB, radiating to the carotids) heard. Dopplerable  dosalis pedis and posterior tibialis pulses. Capillary refill <2 seconds  Pulmonary/Chest: Effort normal. No respiratory distress. She has wheezes (Minimal expiratory wheezes, clearing as exam progressed). She has no rales. She exhibits no tenderness.  Abdominal: Soft. She exhibits no distension. There is no tenderness.  Musculoskeletal: She exhibits edema (2+ right LE edema, 1+ left).  Calf tenderness noted on right.  Neurological: She is alert and oriented to person, place, and time. She has normal strength. A sensory deficit (Patient reports subjective difference in sensation between right and left, but can still feel all stimuli) is present. GCS score is 15.  Skin: Skin is warm and dry. Lesion (There is a 2x3cm ulcer with central eschar on her anterior right shin, indurated to ~5x6cm, significant warmth, erythema involving entire lower leg. No flucuence, pus. Pain with direct palpation.) noted. She is not diaphoretic.        Lab results: Basic Metabolic Panel:  Recent Labs  10/30/12 1616  NA 138  K 3.8  CL 101  CO2 24  GLUCOSE 135*  BUN 41*  CREATININE  1.66*  CALCIUM 9.2   CBC:  Recent Labs  10/30/12 1616  WBC 6.5  HGB 11.5*  HCT 34.0*  MCV 86.5  PLT 285   CBG:  Recent Labs  10/30/12 1529  GLUCAP 113*    Imaging results:  Dg Tibia/fibula Right  10/30/2012   CLINICAL DATA:  Open wound, possible osteomyelitis, fall  EXAM: RIGHT TIBIA AND FIBULA - 2 VIEW  COMPARISON:  01/01/2004  FINDINGS: Four views of the right knee submitted. Again noted extensive degenerative changes with progression from prior exam. There is progression of spurring of femoral condyles. Progression of spurring of tibial plateau. There is diffuse narrowing of joint space most significant medially. Significant narrowing of patellofemoral joint space. Spurring of patella. Mild prepatellar soft tissue swelling. Atherosclerotic calcifications of femoral and popliteal artery. No definite evidence of bone destruction to suggest osteomyelitis. No acute fracture or subluxation.  IMPRESSION: No acute fracture or subluxation. Extensive osteoarthritic changes with progression from prior exam. No definite bone destruction to suggest osteomyelitis.   Electronically Signed   By: Lahoma Crocker   On: 10/30/2012 18:02    Other results: EKG: normal EKG, normal sinus rhythm, unchanged from previous tracings.  Assessment & Plan by Problem: Hina Grissom is a 67 y.o. female PMH aortic stenosis, dCHF, HLD, HTN, PVD, CVA, seizure disorder who presents with a chief complaint of right leg wound.  #Right pretibial ulcer with central eschar and surrounding cellulitis - Patient fell out of bed onto her rolling walker 2.5 weeks ago, bumping her right  shin. Wound has progressed from a bruise into a necrotic ulcer over time. She now has swelling, pain, erythema, warmth, consistent with cellulitis. Fever of 102.6 currently. Other vital signs stable. X-ray not concerning for osteomyelitis. Low concern for necrotizing fascitis as no crepitus on exam or x-ray, slow progression of symptoms. No  recent hospitalization or history of MRSA. She does have calf tenderness and swelling concerning for concomitant DVT. - Admit to IMTS - RLE venous duplex to rule out DVT - Continue clindamycin and vancomycin IV - IVF NS @100cc /hr - Heart healthy diet - Tylenol prn fever - Norco prn pain  #HTN - Stable. - Continue home amlodipine, lisinopril, metoprolol  #dCHF - Stable. - Continue home lasix, spironolactone, ASA  #Seizure disorder - Stable. Last seizure 1 month ago. - Continue home phenobarbital, zonisamide  #HLD - Continue home pravastatin.  Dispo: Disposition is deferred at this time, awaiting improvement of current medical problems. Anticipated discharge in approximately 1-3 day(s).   The patient does have a current PCP (Charlann Lange, MD) and does need an [facility] hospital follow-up appointment after discharge.  The patient does not have transportation limitations that hinder transportation to clinic appointments.  Signed: Lesly Dukes, MD 10/30/2012, 6:55 PM

## 2012-10-30 NOTE — ED Provider Notes (Signed)
CSN: [id number]     Arrival date & time 10/30/12  1518 History   First MD Initiated Contact with Patient 10/30/12 1522     Chief Complaint  Patient presents with  . Abscess   (Consider location/radiation/quality/duration/timing/severity/associated sxs/prior Treatment) Patient is a 67 y.o. female presenting with leg pain. The history is provided by the patient. No language interpreter was used.  Leg Pain Location:  Leg Time since incident:  2 weeks Injury: yes   Mechanism of injury: fall   Fall:    Fall occurred: from bed.   Height of fall:  75ft   Impact surface:  [employer] of impact: right leg.   Entrapped after fall: no   Leg location:  R leg Pain details:    Quality:  Aching   Radiates to:  Does not radiate   Severity:  Severe   Onset quality:  Gradual   Duration:  2 weeks   Timing:  Constant   Progression:  Worsening Chronicity:  New Dislocation: no   Foreign body present:  No foreign bodies Prior injury to area:  No Relieved by:  Nothing Worsened by:  Bearing weight (palpation) Ineffective treatments:  None tried Associated symptoms: decreased ROM   Associated symptoms: no fever, no muscle weakness, no neck pain, no numbness, no stiffness, no swelling and no tingling     Past Medical History  Diagnosis Date  . Aortic stenosis   . Hypertension   . Seizure disorder   . Hyperlipidemia    No past surgical history on file. No family history on file. History  Substance Use Topics  . Smoking status: Former Smoker    Quit date: 06/03/2000  . Smokeless tobacco: Not on file  . Alcohol Use: No   OB History   Grav Para Term Preterm Abortions TAB SAB Ect Mult Living                 Review of Systems  Constitutional: Negative for fever.  HENT: Negative for congestion, sore throat, rhinorrhea and neck pain.   Respiratory: Negative for cough and shortness of breath.   Cardiovascular: Negative for chest pain.  Gastrointestinal: Negative for nausea, vomiting,  abdominal pain and diarrhea.  Genitourinary: Negative for dysuria and hematuria.  Musculoskeletal: Negative for stiffness.  Skin: Negative for rash.  Neurological: Negative for syncope, light-headedness and headaches.  All other systems reviewed and are negative.    Allergies  Review of patient's allergies indicates no known allergies.  Home Medications   Current Outpatient Rx  Name  Route  Sig  Dispense  Refill  . amLODipine (NORVASC) 10 MG tablet      TAKE ONE TABLET BY MOUTH ONE TIME DAILY   90 tablet   4   . aspirin 81 MG EC tablet   Oral   Take 81 mg by mouth daily.           . Calcium-Vitamin D 600-200 MG-UNIT per tablet   Oral   Take 2 tablets by mouth daily.           . furosemide (LASIX) 40 MG tablet      Take 80 mg po in am and 40 mg po in pm.   90 tablet   5   . lisinopril (PRINIVIL,ZESTRIL) 40 MG tablet   Oral   Take 1 tablet (40 mg total) by mouth daily.   30 tablet   11   . metoprolol (LOPRESSOR) 100 MG tablet   Oral  Take 1 tablet (100 mg total) by mouth 2 (two) times daily.   180 tablet   1   . PHENObarbital (LUMINAL) 60 MG tablet      180 mg. Take 3 tablets (180 mg total) by mouth every evening.         [name] PHENobarbital (LUMINAL) 97.2 MG tablet      Take 2 tablets at bedtime   180 tablet   2   . pravastatin (PRAVACHOL) 40 MG tablet      TAKE ONE TABLET BY MOUTH ONE TIME DAILY   30 tablet   11   . spironolactone (ALDACTONE) 50 MG tablet   Oral   Take 1 tablet (50 mg total) by mouth daily.   30 tablet   11   . zonisamide (ZONEGRAN) 100 MG capsule      200 mg. Take 2 capsules (200 mg total) by mouth every evening.          BP 101/44  Pulse 73  Temp(Src) 98.9 F (37.2 C) (Oral)  Resp 19  SpO2 96% Physical Exam  Nursing note and vitals reviewed. Constitutional: She is oriented to person, place, and time. She appears well-developed and well-nourished.  HENT:  Head: Normocephalic and atraumatic.  Right Ear:  External ear normal.  Left Ear: External ear normal.  Eyes: EOM are normal.  Neck: Normal range of motion. Neck supple.  Cardiovascular: Normal rate, regular rhythm and intact distal pulses.  Exam reveals no gallop and no friction rub.   Murmur (3/6 AS murmur) heard. Pulmonary/Chest: Effort normal and breath sounds normal. No respiratory distress. She has no wheezes. She has no rales. She exhibits no tenderness.  Abdominal: Soft. Bowel sounds are normal. She exhibits no distension. There is no tenderness. There is no rebound.  Musculoskeletal: She exhibits edema and tenderness.  right Lower Extremity INSPECTION: large area of erythema/cellulitis, necrotic ulcer 3x2cm PALPATION: TTP over area of cellulitis ROM: Decreased global ROM due to pain. VASCULAR: Extremity warm and well-perfused.Dopplerable  dosalis pedis and posterior tibialis pulses. Capillary refill <2 seconds NEURO-SENSORY: Normal sensibility to light touch in DP/SP/sural/saphenous distributions. No numbness or paresthesias. No focal sensory deficit. NEURO-MOTOR: Intact EHL/FHL/TA/GS motor function. No focal motor deficit.  Lymphadenopathy:    She has no cervical adenopathy.  Neurological: She is alert and oriented to person, place, and time.  Skin: Skin is warm. No rash noted.  Psychiatric: She has a normal mood and affect. Her behavior is normal.    ED Course  Procedures (including critical care time) Labs Review Labs Reviewed  GLUCOSE, CAPILLARY - Abnormal; Notable for the following:    Glucose-Capillary 113 (*)    All other components within normal limits  CBC - Abnormal; Notable for the following:    Hemoglobin 11.5 (*)    HCT 34.0 (*)    All other components within normal limits  BASIC METABOLIC PANEL - Abnormal; Notable for the following:    Glucose, Bld 135 (*)    BUN 41 (*)    Creatinine, Ser 1.66 (*)    GFR calc non Af Amer 31 (*)    GFR calc Af Amer 36 (*)    All other components within normal limits   CULTURE, BLOOD (ROUTINE X 2)  CULTURE, BLOOD (ROUTINE X 2)  BASIC METABOLIC PANEL  CBC   Imaging Review No results found.  MDM   1. Cellulitis   2. Acute kidney injury    3:22 PM Pt is a 67 y.o. female with pertinent  PMHX of HTN,AS, Seizure, HLD who presents to the ED with cellulitis, necrotic ulcer to right leg. Pt fell out of bed 2 weeks ago and suffered a wound to her right lower extremity. Over the past week worsening swelling, pain culminating in a necrotic ulcer today. Seen by cardiology a week ago. Denies fevers. Denies history of diabetes. Denies recent illness, URI. Denies nausea vomiting or diarrhea. Pain  Worse with palpation and bearing weight. Pain constant without radiation. Pt noted worsening swelling warmth over the past week  On exam: elevated temp, VSS. No evidence of SIRs criteria currently on initial evaluation. Exam as above. Concern due to necrotic ulcer. Given timeline worsening over a week, low clinical suspicion for nec fascitis. Plan to give clindamycin IV, no history of MRSA. No recent hospitalization. Will also obtain plain film of the right tib/fib and screening labs  EKG personally reviewed by myself showed NSR, prolonged PR: 1st degree Av block, LVH Rate of 92, PR 256ms, QRS 162ms QT/QTC 360/460ms, normal axis, without evidence of new ischemia. Comparison showed similar, indication: cellulitis   Given cellulitis and need for IV antibiotics will consult medicine for admission. Per nursing rectal temp elevated, will give 1g tylenol and obtain wound culture  XR right tib/fib per my read showed no fracture no definite signs of osteo  Review of labs: CBC showed no leukocytosis, H&&H 11.5/34.0. BMP showed no electrolyte abnormalities. AKI noted  Plan for admission to family medicine for IV antibiotics and wound care for cellulitis.  The patient appears reasonably stabilized for admission considering the current resources, flow, and capabilities available in the  ED at this time, and I doubt any other [facility] requiring further screening and/or treatment in the ED prior to admission.   Plan for admission to Internal Medicine teaching service for further evaluation and management. PT transferred to floor VSS.  Labs, EKG and imaging reviewed by myself and considered in medical decision making if ordered.  Imaging interpreted by radiology. Pt was discussed with my attending, Dr. Dwyane Dee, MD 10/31/12 [phone]

## 2012-10-30 NOTE — ED Notes (Signed)
Pt from home. [place] out of bed two weeks ago and incurred a wound on right front shin. States that right leg has gotten progressively worse, swollen, painful, and hot to the touch. States that she saw her cardiologist a week ago and was told to watch the wound and follow up if it got worse. Wound site is approximately a half dollar in size and is necrotic at the base. Lower right leg down to foot is swollen, tight, and hot to the touch. Pt rates pain 1010 at this time.

## 2012-10-30 NOTE — Progress Notes (Signed)
Attempted to call RN to get report. Nurse not available. Will call back in 5 minutes

## 2012-10-31 ENCOUNTER — Encounter: Payer: Self-pay | Admitting: Internal Medicine

## 2012-10-31 DIAGNOSIS — S81009A Unspecified open wound, unspecified knee, initial encounter: Secondary | ICD-10-CM

## 2012-10-31 DIAGNOSIS — S81809A Unspecified open wound, unspecified lower leg, initial encounter: Secondary | ICD-10-CM

## 2012-10-31 DIAGNOSIS — L03115 Cellulitis of right lower limb: Secondary | ICD-10-CM | POA: Diagnosis present

## 2012-10-31 DIAGNOSIS — S91009A Unspecified open wound, unspecified ankle, initial encounter: Secondary | ICD-10-CM

## 2012-10-31 LAB — BASIC METABOLIC PANEL
BUN: 34 mg/dL — ABNORMAL HIGH (ref 6–23)
CO2: 22 mEq/L (ref 19–32)
Calcium: 8.3 mg/dL — ABNORMAL LOW (ref 8.4–10.5)
GFR calc non Af Amer: 34 mL/min — ABNORMAL LOW (ref >90)
Glucose, Bld: 88 mg/dL (ref 70–99)

## 2012-10-31 LAB — CBC
MCH: 29.3 pg (ref 26.0–34.0)
MCHC: 33.2 g/dL (ref 30.0–36.0)
MCV: 88.2 fL (ref 78.0–100.0)
Platelets: 235 10*3/uL (ref 150–400)

## 2012-10-31 MED ORDER — PHENOBARBITAL 32.4 MG PO TABS
194.4000 mg | ORAL_TABLET | Freq: Every day | ORAL | Status: DC
  Administered 2012-10-31 – 2012-11-01 (×2): 194.4 mg via ORAL
  Filled 2012-10-31 (×2): qty 6

## 2012-10-31 MED ORDER — PHENOBARBITAL 60 MG PO TABS: 180.0000 mg | ORAL_TABLET | Freq: Every day | ORAL | Status: DC

## 2012-10-31 MED ORDER — VANCOMYCIN HCL IN DEXTROSE 1-5 GM/200ML-% IV SOLN
1000.0000 mg | INTRAVENOUS | Status: DC
  Administered 2012-10-31 – 2012-11-01 (×2): 1000 mg via INTRAVENOUS
  Filled 2012-10-31 (×3): qty 200

## 2012-10-31 NOTE — ED Provider Notes (Signed)
I saw and evaluated the patient, reviewed the resident's note and I agree with the findings and plan. Pt notes contusion/abrasion to right leg 1-2 weeks ago. Now w progressive redness around wound. Distal pulses palp. Iv abx. Labs. Admit.   Mirna Mires, MD 10/31/12 [phone]

## 2012-10-31 NOTE — Progress Notes (Signed)
Pt admitted to the unit. Pt is alert and oriented. Pt oriented to room, staff, and call bell. Bed in lowest position. Full assessment to Epic. Call bell with in reach. Told to call for assists. Will continue to monitor.  Sherry [name] E  

## 2012-10-31 NOTE — Consult Note (Signed)
ANTIBIOTIC CONSULT NOTE - INITIAL  Pharmacy Consult for vancomycin Indication: cellulitis  Allergies  Allergen Reactions  . Ibuprofen Other (See Comments)    Dr advised pt not to take ibuprofen    Patient Measurements: Height: 5\' 1"  (154.9 cm) Weight: 214 lb 15.2 oz (97.5 kg) IBW/kg (Calculated) : 47.8  Vital Signs: Temp: 99.7 F (37.6 C) (09/13 0409) Temp src: Oral (09/13 0409) BP: 154/64 mmHg (09/13 0409) Pulse Rate: 82 (09/13 0409) Intake/Output from previous day:  no output charted yet Intake/Output from this shift:   Labs:  Recent Labs  10/30/12 1616 10/31/12 0535  WBC 6.5 4.9  HGB 11.5* 9.9*  PLT 285 235  CREATININE 1.66* 1.54*   Estimated Creatinine Clearance: 38.4 ml/min (by C-G formula based on Cr of 1.54). No results found for this basename: VANCOTROUGH, VANCOPEAK, VANCORANDOM, GENTTROUGH, GENTPEAK, GENTRANDOM, TOBRATROUGH, TOBRAPEAK, TOBRARND, AMIKACINPEAK, AMIKACINTROU, AMIKACIN,  in the last 72 hours   Microbiology: No results found for this or any previous visit (from the past 720 hour(s)).  Medical History: Past Medical History  Diagnosis Date  . Aortic stenosis   . Hypertension   . Seizure disorder   . Hyperlipidemia     Medications:  Scheduled:  . amLODipine  10 mg Oral Daily  . aspirin EC  81 mg Oral Daily  . calcium-vitamin D  2 tablet Oral Q breakfast  . furosemide  40 mg Oral Daily  . heparin  5,000 Units Subcutaneous Q8H  . influenza vac split quadrivalent PF  0.5 mL Intramuscular Tomorrow-1000  . lisinopril  40 mg Oral Daily  . metoprolol  100 mg Oral BID  . phenobarbital  194.4 mg Oral BID  . pneumococcal 23 valent vaccine  0.5 mL Intramuscular Tomorrow-1000  . simvastatin  20 mg Oral q1800  . sodium chloride  3 mL Intravenous Q12H  . spironolactone  50 mg Oral Daily  . vancomycin  1,000 mg Intravenous Q24H  . zonisamide  200 mg Oral QHS   Assessment: 67 yo F admitted 9/12 after a worsening injury from hitting her right  shin on her rolling walker 2 1/2 weeks ago. She states it started as a bruise, but the skin changed color and eventually broken down but now has worsened and is red, swollen, and warm. Pharmacy consulted to dose vancomycin for cellulitis. Patient is also receiving clindamycin per MD.   Tmax 102.6, currently afeb. WBC 4.9 SCr trending down (1.66 >> 1.54). CrCl ~38. Wt 97.5 kg  vanc 9/12 >> clindamycin 9/12 >>  9/12 blood x2 >> sent  Goal of Therapy:  Vancomycin trough level 10-15 mcg/ml  Plan:  -vancomycin 1 g IV q 24 hours -f/u renal function, culture data, clinical progression -consider vanc trough at steady state if therapy continues  Ricki Clack C. Jhoan Schmieder, PharmD Clinical Pharmacist-Resident Pager: [phone] Pharmacy: [phone] 10/31/2012 1:27 PM

## 2012-10-31 NOTE — Progress Notes (Signed)
Medical Student Daily Progress Note  Subjective: Sherry [name] is in good spirits this morning. She feels like the leg is less painful and less swollen this morning.  She has not noticed any additional fevers or chills.  Objective: Vital signs in last 24 hours: Filed Vitals:   10/30/12 2010 10/30/12 2202 10/31/12 0409 10/31/12 0413  BP: 112/50 101/44 154/64   Pulse: 67 73 82   Temp: 98.9 F (37.2 C)  99.7 F (37.6 C)   TempSrc: Oral  Oral   Resp: 19  18   Height:    5\' 1"  (1.549 m)  Weight:    214 lb 15.2 oz (97.5 kg)  SpO2: 96%  99%     Physical Exam: General: Well appearing female in no acute distress Lungs: clear to auscultation bilaterally, mild expiratory wheezes Heart: regular rate and rhythm and systolic murmur: systolic ejection 3/6, crescendo and decrescendo throughout the precordium, radiates to carotids Abdomen: soft, non-tender; bowel sounds normal; no masses,  no organomegaly Extremities: Skin is warm and dry. On her right anterior shin, there is an area of warmth and erythema. There is no pus, crepitus, or drainage. In the center of the erythematous region there is a 2x3 cm eschar.  There is 2+ pitting edema on the right, and 1+ pitting edema on the left. Pulses: Dopplerable bilateral lower extremity pulses   Lab Results: Basic Metabolic Panel:  Recent Labs Lab 10/30/12 1616 10/31/12 0535  NA 138 138  K 3.8 3.9  CL 101 105  CO2 24 22  GLUCOSE 135* 88  BUN 41* 34*  CREATININE 1.66* 1.54*  CALCIUM 9.2 8.3*   CBC:  Recent Labs Lab 10/30/12 1616 10/31/12 0535  WBC 6.5 4.9  HGB 11.5* 9.9*  HCT 34.0* 29.8*  MCV 86.5 88.2  PLT 285 235    Recent Labs Lab 10/30/12 1529  GLUCAP 113*    Studies/Results: Dg Tibia/fibula Right  10/30/2012   CLINICAL DATA:  Open wound, possible osteomyelitis, fall  EXAM: RIGHT TIBIA AND FIBULA - 2 VIEW  COMPARISON:  01/01/2004  FINDINGS: Four views of the right knee submitted. Again noted extensive degenerative  changes with progression from prior exam. There is progression of spurring of femoral condyles. Progression of spurring of tibial plateau. There is diffuse narrowing of joint space most significant medially. Significant narrowing of patellofemoral joint space. Spurring of patella. Mild prepatellar soft tissue swelling. Atherosclerotic calcifications of femoral and popliteal artery. No definite evidence of bone destruction to suggest osteomyelitis. No acute fracture or subluxation.  IMPRESSION: No acute fracture or subluxation. Extensive osteoarthritic changes with progression from prior exam. No definite bone destruction to suggest osteomyelitis.   Electronically Signed   By: Lahoma Crocker   On: 10/30/2012 18:02   Medications: I have reviewed the patient's current medications. Scheduled Meds: . amLODipine  10 mg Oral Daily  . aspirin EC  81 mg Oral Daily  . calcium-vitamin D  2 tablet Oral Q breakfast  . furosemide  40 mg Oral Daily  . heparin  5,000 Units Subcutaneous Q8H  . influenza vac split quadrivalent PF  0.5 mL Intramuscular Tomorrow-1000  . lisinopril  40 mg Oral Daily  . metoprolol  100 mg Oral BID  . phenobarbital  194.4 mg Oral BID  . pneumococcal 23 valent vaccine  0.5 mL Intramuscular Tomorrow-1000  . simvastatin  20 mg Oral q1800  . sodium chloride  3 mL Intravenous Q12H  . spironolactone  50 mg Oral Daily  . zonisamide  200 mg Oral QHS   Continuous Infusions: . sodium chloride 100 mL/hr at 10/30/12 2046   PRN Meds:.acetaminophen, acetaminophen, HYDROcodone-acetaminophen Assessment/Plan: #Right lower extremity cellulitis with pretibial ulcer with central eschar:  The patient sustained an injury to her right lower leg after falling out of bed the week of 08/25. The wound has progressed over the course of the past few weeks and has become increasingly swollen, painful, and erythematous. In the ED, she presented with a fever of 102, but otherwise her vital signs were stable.  -  Continue IV vancomycin - MRI right leg, possibly consult ortho - LE XR shows no definite bone destruction to suggest osteomyelitis.  - Surgery consult recommends no surgical intervention, may benefit from unna boot - Right lower extremity venous duplex showed no evidence of DVT or Baker's cyst - Discontinue IVF  #Hypertension: Blood pressure stable. Continue home blood pressure medications amlodipine 10 mg, metoprolol 100 mg BID, lisinopril 40 mg, furosemide 40 mg.  #Diastolic heart failure: Currently stable. Recent cardiology visit diagnosed diastolic ventricular dysfunction based on most recent echo and suggested that this is contributing to her lower extremity edema.  #Seizure disorder:  Her last seizure was several weeks ago.Takes phenobarbital 194.4 mg BID, zonisamide 200 mg  #Hyperlipidemia: Takes simvastatin 20 mg   LOS: 1 day   This is a Careers information officer Note.  The care of the patient was discussed with Dr. Lucila Maine and the assessment and plan formulated with their assistance.  Please see their attached note for official documentation of the daily encounter.  Dorthey Sawyer [place] 10/31/2012, 10:42 AM

## 2012-10-31 NOTE — H&P (Signed)
INTERNAL MEDICINE TEACHING SERVICE Attending Admission Note  Date: 10/31/2012  Patient name: Sherry [name]  Medical record number: [id number]  Date of birth: [birth year]    I have seen and evaluated [name] and discussed their care with the Residency Team.  67 yr old female with hx AS, HFpEF, HTN, HL, PVD, hx CVA, and hx of seizure disorder presented with pain, swelling on right leg.  She stated that she fell out of her bed around 2 weeks ago and hit her right leg on her rolling walker.  She stated that she was seen at her cardiologist office a few days later and only had a bruise in this area.  She states she took care of the wound her self, but slowly this area became red, swollen, and the skin began to slough off.  She used H2O2 to clean it.  She states she could not bear weight on the leg and came to the ED. On exam, she has a notable 3-4/6 crescendo-decresdendo murmur best heard in RUSB, compatible with AS.  Her RLE shows a 2x 3 cm ulcer with central eschar on anterior right shin with surrounding erythema and tenderness, along with increased warmth over this area. A doppler venous U/S was performed and negative for DVT in RLE.  BC obtained and patient was started on Vancomycin and Clindamycin IV. She states some improvement today.  GS has evaluated her and feels no need to intervene at this time. They recommended an UNNA boot and wound care.   I don't strongly suspect that she has underlying osteomyelitis. Xray does not show any periosteal bony changes or destruction. On hx, she states the injury may have occurred a little over 2 weeks ago, but not as much as a month ago. Osteomyelitis is in DDx, so would consider obtaining MRI of her leg since GS does not feel they need to look deeper at this time.   Dominic Pea, DO, [facility] 10/31/2012, 12:20 PM

## 2012-10-31 NOTE — Consult Note (Signed)
Reason for Consult: Right anterior tibia wound s/p fall Referring Physician: Dr. Lesly Dukes  Sherry [name] is an 67 y.o. female.  HPI: 67 yr old famle who prsented to [facility] with complaints of worsening problems with right leg wound.  About 2-3 weeks ago she fell getting out of bed and hit her shin on her walker.  She has been doing wound care to the area with hydrogen peroxide and wrapping it.  It continued to worsen and became red, swollen, and warm. Last week it started to became painful. Prior to admission, she could not bear weight on the leg 2/2 pain and decided to come to the ED.  We are asked to evaluate the wound for possible surgical intervention.  She does relate some chronic swelling in her legs prior to her fall.   Past Medical History  Diagnosis Date  . Aortic stenosis   . Hypertension   . Seizure disorder   . Hyperlipidemia     History reviewed. No pertinent past surgical history.  No family history on file.  Social History:  reports that she quit smoking about 12 years ago. She does not have any smokeless tobacco history on file. She reports that she does not drink alcohol or use illicit drugs.  Allergies:  Allergies  Allergen Reactions  . Ibuprofen Other (See Comments)    Dr advised pt not to take ibuprofen    Medications: I have reviewed the patient's current medications.  Results for orders placed during the hospital encounter of 10/30/12 (from the past 48 hour(s))  GLUCOSE, CAPILLARY     Status: Abnormal   Collection Time    10/30/12  3:29 PM      Result Value Range   Glucose-Capillary 113 (*) 70 - 99 mg/dL   Comment 1 Notify RN     Comment 2 Documented in Chart    CBC     Status: Abnormal   Collection Time    10/30/12  4:16 PM      Result Value Range   WBC 6.5  4.0 - 10.5 K/uL   RBC 3.93  3.87 - 5.11 MIL/uL   Hemoglobin 11.5 (*) 12.0 - 15.0 g/dL   HCT 34.0 (*) 36.0 - 46.0 %   MCV 86.5  78.0 - 100.0 fL   MCH 29.3  26.0 - 34.0 pg   MCHC 33.8  30.0  - 36.0 g/dL   RDW 13.3  11.5 - 15.5 %   Platelets 285  150 - 400 K/uL  BASIC METABOLIC PANEL     Status: Abnormal   Collection Time    10/30/12  4:16 PM      Result Value Range   Sodium 138  135 - 145 mEq/L   Potassium 3.8  3.5 - 5.1 mEq/L   Chloride 101  96 - 112 mEq/L   CO2 24  19 - 32 mEq/L   Glucose, Bld 135 (*) 70 - 99 mg/dL   BUN 41 (*) 6 - 23 mg/dL   Creatinine, Ser 1.66 (*) 0.50 - 1.10 mg/dL   Calcium 9.2  8.4 - 10.5 mg/dL   GFR calc non Af Amer 31 (*) >90 mL/min   GFR calc Af Amer 36 (*) >90 mL/min   Comment: (NOTE)     The eGFR has been calculated using the CKD EPI equation.     This calculation has not been validated in all clinical situations.     eGFR's persistently <90 mL/min signify possible Chronic Kidney  Disease.  BASIC METABOLIC PANEL     Status: Abnormal   Collection Time    10/31/12  5:35 AM      Result Value Range   Sodium 138  135 - 145 mEq/L   Potassium 3.9  3.5 - 5.1 mEq/L   Chloride 105  96 - 112 mEq/L   CO2 22  19 - 32 mEq/L   Glucose, Bld 88  70 - 99 mg/dL   BUN 34 (*) 6 - 23 mg/dL   Creatinine, Ser 1.54 (*) 0.50 - 1.10 mg/dL   Calcium 8.3 (*) 8.4 - 10.5 mg/dL   GFR calc non Af Amer 34 (*) >90 mL/min   GFR calc Af Amer 39 (*) >90 mL/min   Comment: (NOTE)     The eGFR has been calculated using the CKD EPI equation.     This calculation has not been validated in all clinical situations.     eGFR's persistently <90 mL/min signify possible Chronic Kidney     Disease.  CBC     Status: Abnormal   Collection Time    10/31/12  5:35 AM      Result Value Range   WBC 4.9  4.0 - 10.5 K/uL   RBC 3.38 (*) 3.87 - 5.11 MIL/uL   Hemoglobin 9.9 (*) 12.0 - 15.0 g/dL   HCT 29.8 (*) 36.0 - 46.0 %   MCV 88.2  78.0 - 100.0 fL   MCH 29.3  26.0 - 34.0 pg   MCHC 33.2  30.0 - 36.0 g/dL   RDW 13.5  11.5 - 15.5 %   Platelets 235  150 - 400 K/uL    Dg Tibia/fibula Right  10/30/2012   CLINICAL DATA:  Open wound, possible osteomyelitis, fall  EXAM: RIGHT TIBIA  AND FIBULA - 2 VIEW  COMPARISON:  01/01/2004  FINDINGS: Four views of the right knee submitted. Again noted extensive degenerative changes with progression from prior exam. There is progression of spurring of femoral condyles. Progression of spurring of tibial plateau. There is diffuse narrowing of joint space most significant medially. Significant narrowing of patellofemoral joint space. Spurring of patella. Mild prepatellar soft tissue swelling. Atherosclerotic calcifications of femoral and popliteal artery. No definite evidence of bone destruction to suggest osteomyelitis. No acute fracture or subluxation.  IMPRESSION: No acute fracture or subluxation. Extensive osteoarthritic changes with progression from prior exam. No definite bone destruction to suggest osteomyelitis.   Electronically Signed   By: Lahoma Crocker   On: 10/30/2012 18:02    Review of Systems  Constitutional: Positive for fever (subjective). Negative for chills and malaise/fatigue.  HENT: Negative.   Eyes: Negative.   Respiratory: Negative.   Cardiovascular: Negative.   Gastrointestinal: Negative.   Genitourinary: Negative.   Musculoskeletal:       Pain over right anterior tibia   Skin: Negative.   Neurological: Negative.   Endo/Heme/Allergies: Negative.   Psychiatric/Behavioral: Negative.    Blood pressure 154/64, pulse 82, temperature 99.7 F (37.6 C), temperature source Oral, resp. rate 18, height 5\' 1"  (1.549 m), weight 214 lb 15.2 oz (97.5 kg), SpO2 99.00%. Physical Exam  Constitutional: She is oriented to person, place, and time. She appears well-developed and well-nourished. No distress.  HENT:  Head: Normocephalic and atraumatic.  Eyes: Conjunctivae are normal. Pupils are equal, round, and reactive to light.  Neck: Normal range of motion. Neck supple.  Cardiovascular: Normal rate and regular rhythm.   Respiratory: Effort normal and breath sounds normal.  GI: Soft. Bowel sounds  are normal.  Genitourinary:   deferred  Musculoskeletal: She exhibits edema (Right leg 1+ edema).  Neurological: She is alert and oriented to person, place, and time.  Skin:  Irregular, about 2-3cm wound on right anterior tibia with eschar, surrounding swelling consistent with hematoma, skin changes consistent with hematoma  Doesn't appear to be infected  Psychiatric: She has a normal mood and affect. Her behavior is normal. Thought content normal.    Assessment/Plan: Traumatic Right anterior tibia wound: there is eschar over the wound and findings consistent with a large hematoma but it does not appear infected.  Would not proceed with any surgical intervention at this time, would recommend an UNNA boot for medication and compression and change this weekly.  Elevate leg to help with edema as well.  Will take time for wound to heal given underlying chronic edema and the hematoma that is present.  We will sign off for now, please call with questions or concerns.   WHITE, ELIZABETH 10/31/2012, 10:48 AM

## 2012-10-31 NOTE — Progress Notes (Signed)
Orthopedic Tech Progress Note Patient Details:  Sherry [name] 1946-01-13 [id number] Unna boot applied to Right LE. Application tolerated well.  Ortho Devices Type of Ortho Device: Haematologist Ortho Device/Splint Location: Right LE Ortho Device/Splint Interventions: Application   Asia R Thompson 10/31/2012, 12:51 PM

## 2012-10-31 NOTE — Progress Notes (Addendum)
Subjective: Patient seen at bedside. She feels well this morning. The tenderness in her wound has decreased, and she thinks the swelling may have gone down a little bit. She denies chest pain, abdominal pain, shortness of breath.  Objective: Vital signs in last 24 hours: Filed Vitals:   10/30/12 2010 10/30/12 2202 10/31/12 0409 10/31/12 0413  BP: 112/50 101/44 154/64   Pulse: 67 73 82   Temp: 98.9 F (37.2 C)  99.7 F (37.6 C)   TempSrc: Oral  Oral   Resp: 19  18   Height:    5\' 1"  (1.549 m)  Weight:    214 lb 15.2 oz (97.5 kg)  SpO2: 96%  99%    Weight change:   Intake/Output Summary (Last 24 hours) at 10/31/12 1253 Last data filed at 10/31/12 0410  Gross per 24 hour  Intake      0 ml  Output      0 ml  Net      0 ml    Physical Exam  Constitutional: She is oriented to person, place, and time and well-developed, well-nourished, and in no distress.  HENT:  Head: Normocephalic and atraumatic.  Eyes: Conjunctivae and EOM are normal. Pupils are equal, round, and reactive to light.  Neck: Normal range of motion. Neck supple.  Cardiovascular: Normal rate, regular rhythm and intact distal pulses. Exam reveals no gallop and no friction rub.  Murmur (3/6 harsh crescendo-descrendo murmur best heard @ RUSB, radiating to the carotids) heard. Capillary refill <2 seconds  Pulmonary/Chest: Effort normal. No respiratory distress. No wheezing. She has no rales. She exhibits no tenderness.  Abdominal: Soft. She exhibits no distension. There is no tenderness.  Musculoskeletal: She exhibits edema (2+ right LE edema, 1+ left).  Neurological: She is alert and oriented to person, place, and time. She has normal strength. A sensory deficit (Patient reports subjective difference in sensation between right and left, but can still feel all stimuli) is present. GCS score is 15.  Skin: Skin is warm and dry. Lesion (There is a 2x3cm ulcer with central eschar on her anterior right shin, indurated to  ~5x6cm, significant warmth, erythema involving entire lower leg. No flucuence, pus. Swelling and pain have improved this morning s/p antibiotics.) noted. She is not diaphoretic.       Lab Results: Basic Metabolic Panel:  Recent Labs Lab 10/30/12 1616 10/31/12 0535  NA 138 138  K 3.8 3.9  CL 101 105  CO2 24 22  GLUCOSE 135* 88  BUN 41* 34*  CREATININE 1.66* 1.54*  CALCIUM 9.2 8.3*   CBC:  Recent Labs Lab 10/30/12 1616 10/31/12 0535  WBC 6.5 4.9  HGB 11.5* 9.9*  HCT 34.0* 29.8*  MCV 86.5 88.2  PLT 285 235   CBG:  Recent Labs Lab 10/30/12 1529  GLUCAP 113*    Micro Results: No results found for this or any previous visit (from the past 240 hour(s)). Studies/Results: Dg Tibia/fibula Right  10/30/2012   CLINICAL DATA:  Open wound, possible osteomyelitis, fall  EXAM: RIGHT TIBIA AND FIBULA - 2 VIEW  COMPARISON:  01/01/2004  FINDINGS: Four views of the right knee submitted. Again noted extensive degenerative changes with progression from prior exam. There is progression of spurring of femoral condyles. Progression of spurring of tibial plateau. There is diffuse narrowing of joint space most significant medially. Significant narrowing of patellofemoral joint space. Spurring of patella. Mild prepatellar soft tissue swelling. Atherosclerotic calcifications of femoral and popliteal artery. No definite evidence  of bone destruction to suggest osteomyelitis. No acute fracture or subluxation.  IMPRESSION: No acute fracture or subluxation. Extensive osteoarthritic changes with progression from prior exam. No definite bone destruction to suggest osteomyelitis.   Electronically Signed   By: Lahoma Crocker   On: 10/30/2012 18:02   Medications: I have reviewed the patient's current medications. Scheduled Meds: . amLODipine  10 mg Oral Daily  . aspirin EC  81 mg Oral Daily  . calcium-vitamin D  2 tablet Oral Q breakfast  . furosemide  40 mg Oral Daily  . heparin  5,000 Units Subcutaneous  Q8H  . influenza vac split quadrivalent PF  0.5 mL Intramuscular Tomorrow-1000  . lisinopril  40 mg Oral Daily  . metoprolol  100 mg Oral BID  . phenobarbital  194.4 mg Oral BID  . pneumococcal 23 valent vaccine  0.5 mL Intramuscular Tomorrow-1000  . simvastatin  20 mg Oral q1800  . sodium chloride  3 mL Intravenous Q12H  . spironolactone  50 mg Oral Daily  . vancomycin  1,000 mg Intravenous Q24H  . zonisamide  200 mg Oral QHS   Continuous Infusions:   PRN Meds:.acetaminophen, acetaminophen, HYDROcodone-acetaminophen  Assessment/Plan: Sherry [name] is a 67 y.o. female PMH aortic stenosis, dCHF, HLD, HTN, PVD, CVA, seizure disorder who presents with a chief complaint of right leg wound.  #Right pretibial ulcer with central eschar and surrounding cellulitis - Patient fell out of bed onto her rolling walker 2.5 weeks ago, bumping her right shin. Wound has progressed from a bruise into a necrotic ulcer over time. She has swelling, pain, erythema, warmth, consistent with cellulitis. Appearance has improved overnight, with decreased swelling and pain. Ulcer still with eschar, unable to determine stage at this time. No fevers overnight. RLE venous duplex negative for DVT or Baker's cyst. - Appreciate surgery recs, no surgical intervention needed - UNNA boot with weekly changes - Will instruct nurse to elevate leg - Continue IV vancomycin, discontinue clindamycin - Heart healthy diet  - Tylenol prn fever  - Norco prn pain - Follow up blood cultures - Follow up am CBC, BMP  #HTN - Stable.  - Continue home amlodipine, lisinopril, metoprolol   #dCHF - Stable.  - Continue home lasix, spironolactone, ASA   #Seizure disorder - Stable. Last seizure 1 month ago.  - Continue home phenobarbital, zonisamide   #HLD - Continue home pravastatin.  #DVT PPX - subq heparin  Dispo: Disposition is deferred at this time, awaiting improvement of current medical problems.  Anticipated discharge in  approximately 1-3 day(s).   The patient does have a current PCP (Charlann Lange, MD) and does need an [facility] hospital follow-up appointment after discharge.  The patient does not have transportation limitations that hinder transportation to clinic appointments.  .Services Needed at time of discharge: Y = Yes, Blank = No PT:   OT:   RN:   Equipment:   Other:     LOS: 1 day   Lesly Dukes, MD 10/31/2012, 12:53 PM

## 2012-10-31 NOTE — Consult Note (Signed)
Pt seen and examined.  Hematoma and old wound noted.  May benefit from [place].  Cannot comment about osteomyelitis but plain films show no fracture or other bony issues.  This will help reduce swelling.  Normal WBC and less tender.

## 2012-11-01 LAB — CBC
HCT: 30.2 % — ABNORMAL LOW (ref 36.0–46.0)
Platelets: 223 10*3/uL (ref 150–400)
RDW: 13.3 % (ref 11.5–15.5)
WBC: 5.5 10*3/uL (ref 4.0–10.5)

## 2012-11-01 LAB — BASIC METABOLIC PANEL
Calcium: 8.9 mg/dL (ref 8.4–10.5)
Chloride: 104 mEq/L (ref 96–112)
Creatinine, Ser: 1.43 mg/dL — ABNORMAL HIGH (ref 0.50–1.10)
GFR calc Af Amer: 43 mL/min — ABNORMAL LOW (ref >90)
GFR calc non Af Amer: 37 mL/min — ABNORMAL LOW (ref >90)

## 2012-11-01 MED ORDER — SIMVASTATIN 20 MG PO TABS: 20.0000 mg | ORAL_TABLET | Freq: Every day | ORAL | Status: DC

## 2012-11-01 MED ORDER — FUROSEMIDE 40 MG PO TABS
40.0000 mg | ORAL_TABLET | Freq: Two times a day (BID) | ORAL | Status: DC
  Administered 2012-11-01 – 2012-11-02 (×2): 40 mg via ORAL
  Filled 2012-11-01 (×6): qty 1

## 2012-11-01 MED ORDER — AMLODIPINE BESYLATE 10 MG PO TABS: 10.0000 mg | ORAL_TABLET | Freq: Every day | ORAL | Status: DC

## 2012-11-01 NOTE — Progress Notes (Signed)
   CARE MANAGEMENT NOTE 11/01/2012  Patient:  Sherry [name], Sherry [name]   Account Number:  [id number]  Date Initiated:  11/01/2012  Documentation initiated by:  [facility]  Subjective/Objective Assessment:   adm: Right leg pain     Action/Plan:   discharge planning   Anticipated DC Date:  11/03/2012   Anticipated DC Plan:  [place]  CM consult      [facility] Choice  HOME HEALTH   Choice offered to / List presented to:  C-1 Patient           Status of service:  In process, will continue to follow Medicare Important Message given?   (If response is "NO", the following Medicare IM given date fields will be blank) Date Medicare IM given:   Date Additional Medicare IM given:    Discharge Disposition:    Per UR Regulation:    If discussed at Long Length of Stay Meetings, dates discussed:    Comments:  11/01/12 09:15 CM met with pt and pt's husband in room to offer choice of home health agency.  Pt states she would like [facility] to deliver any HH.  Waiting orders.  Will contine to monitor for discharge needs.  Mariane Masters, BSN, CM [phone].

## 2012-11-01 NOTE — Progress Notes (Signed)
Surgery attending note:  I have interviewed and examined this patient this morning. I agree with the assessment and treatment plan outlined by E.White, [place].   Edsel Petrin. Dalbert Batman, M.D., [facility] General and Minimally invasive Surgery Breast and Colorectal Surgery Office:   [phone] Pager:   [phone]

## 2012-11-01 NOTE — Progress Notes (Signed)
Patient ID: Sherry [name], female   DOB: 11-Mar-1945, 67 y.o.   MRN: [id number]    Subjective: Pt wo complaints, UNNA boot in place, wants to go home soon  Objective: Vital signs in last 24 hours: Temp:  [98.9 F (37.2 C)-99.6 F (37.6 C)] 99.5 F (37.5 C) (09/14 0430) Pulse Rate:  [70-86] 74 (09/14 0430) Resp:  [17-18] 17 (09/14 0430) BP: (127-173)/(70-75) 173/75 mmHg (09/14 0700) SpO2:  [96 %-98 %] 98 % (09/14 0430) Last BM Date: 10/21/12 (PTA)  Intake/Output from previous day: 09/13 0701 - 09/14 0700 In: 200 [IV Piggyback:200] Out: 250 [Urine:250] Intake/Output this shift:    PE: Ext: right leg in UNNA boot now, still with edema present, NV intact General: NAD  Lab Results:   Recent Labs  10/31/12 0535 11/01/12 0500  WBC 4.9 5.5  HGB 9.9* 9.8*  HCT 29.8* 30.2*  PLT 235 223   BMET  Recent Labs  10/31/12 0535 11/01/12 0500  NA 138 137  K 3.9 4.2  CL 105 104  CO2 22 21  GLUCOSE 88 95  BUN 34* 25*  CREATININE 1.54* 1.43*  CALCIUM 8.3* 8.9   PT/INR No results found for this basename: LABPROT, INR,  in the last 72 hours CMP     Component Value Date/Time   NA 137 11/01/2012 0500   K 4.2 11/01/2012 0500   CL 104 11/01/2012 0500   CO2 21 11/01/2012 0500   GLUCOSE 95 11/01/2012 0500   BUN 25* 11/01/2012 0500   CREATININE 1.43* 11/01/2012 0500   CREATININE 1.69* 07/02/2012 1642   CALCIUM 8.9 11/01/2012 0500   PROT 7.2 07/02/2012 1642   ALBUMIN 4.1 07/02/2012 1642   AST 21 07/02/2012 1642   ALT 14 07/02/2012 1642   ALKPHOS 65 07/02/2012 1642   BILITOT 0.3 07/02/2012 1642   GFRNONAA 37* 11/01/2012 0500   GFRAA 43* 11/01/2012 0500   Lipase  No results found for this basename: lipase       Studies/Results: Dg Tibia/fibula Right  10/30/2012   CLINICAL DATA:  Open wound, possible osteomyelitis, fall  EXAM: RIGHT TIBIA AND FIBULA - 2 VIEW  COMPARISON:  01/01/2004  FINDINGS: Four views of the right knee submitted. Again noted extensive degenerative changes with  progression from prior exam. There is progression of spurring of femoral condyles. Progression of spurring of tibial plateau. There is diffuse narrowing of joint space most significant medially. Significant narrowing of patellofemoral joint space. Spurring of patella. Mild prepatellar soft tissue swelling. Atherosclerotic calcifications of femoral and popliteal artery. No definite evidence of bone destruction to suggest osteomyelitis. No acute fracture or subluxation.  IMPRESSION: No acute fracture or subluxation. Extensive osteoarthritic changes with progression from prior exam. No definite bone destruction to suggest osteomyelitis.   Electronically Signed   By: Lahoma Crocker   On: 10/30/2012 18:02    Anti-infectives: Anti-infectives   Start     Dose/Rate Route Frequency Ordered Stop   10/31/12 2000  vancomycin (VANCOCIN) IVPB 1000 mg/200 mL premix     1,000 mg 200 mL/hr over 60 Minutes Intravenous Every 24 hours 10/31/12 1246     10/30/12 1730  vancomycin (VANCOCIN) IVPB 1000 mg/200 mL premix     1,000 mg 200 mL/hr over 60 Minutes Intravenous  Once 10/30/12 1726 10/30/12 2027   10/30/12 1545  clindamycin (CLEOCIN) IVPB 600 mg     600 mg 100 mL/hr over 30 Minutes Intravenous  Once 10/30/12 1539 10/30/12 1926       Assessment/Plan Right  anterior tibia traumatic wound with CVI: unna boot in place and this will need to be changed weekly, elevate leg as well to control swelling and help facilitate healing, no further recs from surgical standpoint, we will sign off, call with questions or concerns.   LOS: 2 days    Katlen Seyer 11/01/2012

## 2012-11-01 NOTE — Progress Notes (Signed)
Subjective: The patient is doing well this morning and is waiting for the scan of her leg. She is denying fevers, chills, pain in her leg. She is not having chest pain, SOB, nausea, vomiting, diarrhea.   Objective: Vital signs in last 24 hours: Filed Vitals:   10/31/12 1451 10/31/12 2220 11/01/12 0430 11/01/12 0700  BP: 153/75 127/70 165/70 173/75  Pulse: 70 86 74   Temp: 98.9 F (37.2 C) 99.6 F (37.6 C) 99.5 F (37.5 C)   TempSrc: Oral Oral Oral   Resp: 18 17 17    Height:      Weight:      SpO2: 97% 96% 98%    Weight change:   Intake/Output Summary (Last 24 hours) at 11/01/12 0940 Last data filed at 11/01/12 0653  Gross per 24 hour  Intake    200 ml  Output    250 ml  Net    -50 ml   General: resting in bed, about to eat breakfast HEENT: PERRL, EOMI, no scleral icterus Cardiac: S1 S2 normal Pulm: clear to auscultation bilaterally, moving normal volumes of air Abd: soft, nontender, nondistended, BS present Ext: warm and well perfused, no pedal edema, unna boot present on the right leg and did not remove but no drainage of the wound evident, good pulses in the right dorsalis pedis and posterior tibial on the right foot and left foot Neuro: alert and oriented X3, cranial nerves II-XII grossly intact  Lab Results: Basic Metabolic Panel:  Recent Labs Lab 10/31/12 0535 11/01/12 0500  NA 138 137  K 3.9 4.2  CL 105 104  CO2 22 21  GLUCOSE 88 95  BUN 34* 25*  CREATININE 1.54* 1.43*  CALCIUM 8.3* 8.9   CBC:  Recent Labs Lab 10/31/12 0535 11/01/12 0500  WBC 4.9 5.5  HGB 9.9* 9.8*  HCT 29.8* 30.2*  MCV 88.2 87.8  PLT 235 223   CBG:  Recent Labs Lab 10/30/12 1529  GLUCAP 113*   Studies/Results: Dg Tibia/fibula Right  10/30/2012   CLINICAL DATA:  Open wound, possible osteomyelitis, fall  EXAM: RIGHT TIBIA AND FIBULA - 2 VIEW  COMPARISON:  01/01/2004  FINDINGS: Four views of the right knee submitted. Again noted extensive degenerative changes with  progression from prior exam. There is progression of spurring of femoral condyles. Progression of spurring of tibial plateau. There is diffuse narrowing of joint space most significant medially. Significant narrowing of patellofemoral joint space. Spurring of patella. Mild prepatellar soft tissue swelling. Atherosclerotic calcifications of femoral and popliteal artery. No definite evidence of bone destruction to suggest osteomyelitis. No acute fracture or subluxation.  IMPRESSION: No acute fracture or subluxation. Extensive osteoarthritic changes with progression from prior exam. No definite bone destruction to suggest osteomyelitis.   Electronically Signed   By: Lahoma Crocker   On: 10/30/2012 18:02   Medications: I have reviewed the patient's current medications. Scheduled Meds: . amLODipine  10 mg Oral Daily  . aspirin EC  81 mg Oral Daily  . calcium-vitamin D  2 tablet Oral Q breakfast  . furosemide  40 mg Oral BID  . heparin  5,000 Units Subcutaneous Q8H  . lisinopril  40 mg Oral Daily  . metoprolol  100 mg Oral BID  . PHENObarbital  194.4 mg Oral QHS  . simvastatin  20 mg Oral q1800  . sodium chloride  3 mL Intravenous Q12H  . spironolactone  50 mg Oral Daily  . vancomycin  1,000 mg Intravenous Q24H  . zonisamide  200 mg Oral QHS   Continuous Infusions:  PRN Meds:.acetaminophen, acetaminophen, HYDROcodone-acetaminophen Assessment/Plan:  Cellulitis -  Continue to treat with vancomycin today and likely switch to PO regimen and discharge home tomorrow if MRI done. No progression and pain is improving. She may have vascular disease causing decreased healing. She has good pulses in her feet and swelling is decreased with unna boot. -Continue Vancomycin -Await MRI scan -Once determined no osteomyelitis can switch to PO regimen  HYPERLIPIDEMIA -Conversion of her home statin to formulary equivalent with no problems as of yet.   HYPERTENSION - BP high here and she is on her home medications.  Have adjusted dosing of lasix to BID as she takes it at home. HR not low (74) so room to increase metoprolol if needed.   SEIZURE DISORDER - Continuing the home zonegran and phenobarbital and no seizurelike activity noted here. Stable.   CKD (chronic kidney disease) stage 3, GFR 30-59 ml/min - Her creatinine is at baseline and we will follow closely given the vancomycin.   Dispo: Disposition is deferred at this time, awaiting improvement of current medical problems.  Anticipated discharge in approximately 1-2 day(s).   The patient does have a current PCP (Charlann Lange, MD) and does need an [facility] hospital follow-up appointment after discharge.  The patient does not have transportation limitations that hinder transportation to clinic appointments.  .Services Needed at time of discharge: Y = Yes, Blank = No PT:   OT:   RN:   Equipment:   Other:     LOS: 2 days   Olga Millers, MD 11/01/2012, 9:40 AM

## 2012-11-01 NOTE — Progress Notes (Signed)
Rehab Admissions Coordinator Note:  Patient was screened by Cleatrice Burke for appropriateness for an Inpatient Acute Rehab Consult.  At this time, we are recommending [place]. [employer] Medicare will not approve an inpt rehab admission for current diagnosis.   Cleatrice Burke 11/01/2012, 4:17 PM  I can be reached at [phone].

## 2012-11-01 NOTE — Progress Notes (Signed)
  I have seen and examined the patient, and reviewed the daily progress note by Dorthey Sawyer, MS 3 and discussed the care of the patient with them. Please see my progress note from 10/31/2012 for further details regarding assessment and plan.    Signed:  Lesly Dukes, MD 11/01/2012, 9:26 PM

## 2012-11-01 NOTE — Evaluation (Signed)
Physical Therapy Evaluation Patient Details Name: Sherry [name] MRN: [id number] DOB: 24-May-1945 Today's Date: 11/01/2012 Time: 1330-1410 PT Time Calculation (min): 40 min  PT Assessment / Plan / Recommendation History of Present Illness    HPI: 67 yr old famle who prsented to [facility] with complaints of worsening problems with right leg wound. About 2-3 weeks ago she fell getting out of bed and hit her shin on her walker. She has been doing wound care to the area with hydrogen peroxide and wrapping it. It continued to worsen and became red, swollen, and warm. Last week it started to became painful. Prior to admission, she could not bear weight on the leg 2/2 pain and decided to come to the ED.    Clinical Impression  Pt presents with profound mobility impairments, unable to independently move to sitting EOB and unable to attempt standing due to pain in legs/feet.  Also presents with mild confusion, see below for details.  Recommend postacute therapy (placed CIR to screen for potential admission); recommend sit EOB with nursing assist 2-3 times/day and continue acute PT as described below.   May benefit from cast shoe to right foot for standing/walking as she progresses, please order if you agree.      PT Assessment  Patient needs continued PT services    Follow Up Recommendations  CIR;SNF;Supervision/Assistance - 24 hour    Does the patient have the potential to tolerate intense rehabilitation      Barriers to Discharge        Equipment Recommendations  None recommended by PT    Recommendations for Other Services Rehab consult   Frequency Min 3X/week    Precautions / Restrictions Precautions Precautions: Fall Precaution Comments: uses RW baseline, now limbs are weak and painful and poor ability to repostion in bed Restrictions Weight Bearing Restrictions: No Other Position/Activity Restrictions: elevate limb   Pertinent Vitals/Pain Unable to rate pain, exacerbated by movement and  sitting with limb dependent.        Mobility  Bed Mobility Bed Mobility: Rolling Right;Rolling Left;Supine to Sit;Sit to Supine Rolling Right: 4: Min assist;With rail Rolling Left: 4: Min assist;With rail Supine to Sit: 3: Mod assist;With rails;HOB elevated (with bed pad to assist pivot to EOB) Sit to Supine: 2: Max assist;HOB flat Details for Bed Mobility Assistance: Use of pad to pivot hips to EOB and verbal/tactile cues to guide and direct along with physical assist to elevate legs back to bed surface; total assist +2 to scoot to HOB in supine; min assist with frequent instructional then demonstrational cues to seated-scoot to [facility] Transfers Transfers: Sit to Stand Sit to Stand: Other (comment) (unable) Details for Transfer Assistance: attempted to stand, pt not able to tolerate pressure on feet or figure out how to position self for maximum leverage.  Returned to supine.  Soaked in urine, CNA assisted to provide pericare and change linens/gown. Ambulation/Gait Ambulation/Gait Assistance: Not tested (comment) Stairs: No Wheelchair Mobility Wheelchair Mobility: No    Exercises     PT Diagnosis: Acute pain;Generalized weakness  PT Problem List: Pain;Obesity;Decreased safety awareness;Decreased knowledge of use of DME;Decreased cognition;Decreased mobility;Decreased activity tolerance;Decreased range of motion;Decreased strength;Decreased skin integrity PT Treatment Interventions: Modalities;Patient/family education;Therapeutic exercise;Therapeutic activities;Functional mobility training;Gait training;DME instruction     PT Goals(Current goals can be found in the care plan section) Acute Rehab PT Goals PT Goal Formulation: With patient Time For Goal Achievement: 11/15/12 Potential to Achieve Goals: Good  Visit Information  Last PT Received On: 11/01/12 Assistance Needed: +2  Prior [place] expects to be discharged to:: Private  residence Living Arrangements: Spouse/significant other Available Help at Discharge: Family;Available PRN/intermittently;Available 24 hours/day Type of Home: House Home Access: Ramped entrance Home Layout: One level Home Equipment: Walker - 4 wheels Additional Comments: may have other DME, pt somewhat confused and unable to clearly assess Prior Function Level of Independence: Independent with assistive device(s) (per pt; however chart states pt was less mobile) Comments: pt states she was mobile in home with RW and did not have difficulty getting around house.  Per RN and notes in chart, pt was minimally mobile.  Unclear TRUE prior level Communication Communication: No difficulties    Cognition  Cognition Arousal/Alertness: Awake/alert Behavior During Therapy: WFL for tasks assessed/performed Overall Cognitive Status: Impaired/Different from baseline Area of Impairment: Orientation;Following commands;Problem solving Orientation Level: Place (brief disorientation thinking she was still in her home) Following Commands: Follows multi-step commands inconsistently (problem processing multi-step motor command to scoot HOB) Problem Solving: Difficulty sequencing;Requires verbal cues;Requires tactile cues;Slow processing General Comments: Reported observations to RN who states this is new. Noted 100.4 temperature taken at end of session, and RN to reassess/monitor    Extremity/Trunk Assessment Upper Extremity Assessment Upper Extremity Assessment: Defer to OT evaluation Lower Extremity Assessment Lower Extremity Assessment: RLE deficits/detail;LLE deficits/detail RLE Deficits / Details: Unna Boot to limb, painful, poor ability to initiate muscular contraction for bed mobiilty, noted slightly improved by end of session with 2+ to 3-/5 at quads to SAQ RLE: Unable to fully assess due to pain;Unable to fully assess due to immobilization LLE Deficits / Details: generally weak and painful with  similar impairments as RIGHT limb LLE: Unable to fully assess due to pain   Balance Balance Balance Assessed: No  End of Session PT - End of Session Equipment Utilized During Treatment: Gait belt Activity Tolerance: Patient limited by pain Patient left: in bed;with call bell/phone within reach;with family/visitor present Nurse Communication: Mobility status  GP     Herbie Drape 11/01/2012, 2:17 PM

## 2012-11-01 NOTE — Consult Note (Signed)
WOC consult Note Reason for Consult:Patient requested by admitting MD for evaluation of necrotic ulceration on right LE; simultaneous consultation to CCS.  CCS has managed wound and ordered Unna's Boot that will require change once weekly.  [place] Nurse did not see and has nothing to add. Thanks, Maudie Flakes, MSN, RN, [place], [place], [place] [phone])

## 2012-11-02 DIAGNOSIS — R269 Unspecified abnormalities of gait and mobility: Secondary | ICD-10-CM

## 2012-11-02 LAB — CBC WITH DIFFERENTIAL/PLATELET
Basophils Absolute: 0 10*3/uL (ref 0.0–0.1)
HCT: 29.2 % — ABNORMAL LOW (ref 36.0–46.0)
Hemoglobin: 9.6 g/dL — ABNORMAL LOW (ref 12.0–15.0)
Lymphocytes Relative: 21 % (ref 12–46)
Monocytes Absolute: 0.7 10*3/uL (ref 0.1–1.0)
Monocytes Relative: 14 % — ABNORMAL HIGH (ref 3–12)
Neutro Abs: 2.9 10*3/uL (ref 1.7–7.7)
Neutrophils Relative %: 62 % (ref 43–77)
WBC: 4.7 10*3/uL (ref 4.0–10.5)

## 2012-11-02 LAB — BASIC METABOLIC PANEL
BUN: 22 mg/dL (ref 6–23)
CO2: 22 mEq/L (ref 19–32)
Chloride: 101 mEq/L (ref 96–112)
Creatinine, Ser: 1.46 mg/dL — ABNORMAL HIGH (ref 0.50–1.10)
Potassium: 3.8 mEq/L (ref 3.5–5.1)

## 2012-11-02 MED ORDER — DOXYCYCLINE HYCLATE 100 MG PO TABS: 100.0000 mg | ORAL_TABLET | Freq: Two times a day (BID) | ORAL | Status: AC

## 2012-11-02 MED ORDER — DOXYCYCLINE HYCLATE 100 MG PO TABS
100.0000 mg | ORAL_TABLET | Freq: Two times a day (BID) | ORAL | Status: DC
  Administered 2012-11-02: 100 mg via ORAL
  Filled 2012-11-02 (×2): qty 1

## 2012-11-02 MED ORDER — HYDROCODONE-ACETAMINOPHEN 5-325 MG PO TABS: 1.0000 | ORAL_TABLET | ORAL | Status: DC | PRN

## 2012-11-02 NOTE — Discharge Summary (Signed)
Name: Sherry [name] MRN: [id number] DOB: 11/19/1945 67 y.o. PCP: Charlann Lange, MD  Date of Admission: 10/30/2012  3:18 PM Date of Discharge: 11/02/2012 Attending Physician: Karren Cobble, MD  Discharge Diagnosis:  1. Right pretibial ulcer with central eschar and surrounding cellulitis 2. Left foot and ankle pain 3. Mild confusion 4. Hypertension 5. Diastolic congestive heart failure 6. Seizure disorder 7. Hyperlipidemia  Discharge Medications:   Medication List         amLODipine 10 MG tablet  Commonly known as:  NORVASC  Take 10 mg by mouth daily.     aspirin 81 MG EC tablet  Take 81 mg by mouth daily.     CALCIUM 500 +D 500-400 MG-UNIT Tabs  Generic drug:  Calcium Carb-Cholecalciferol  Take 1 tablet by mouth 2 (two) times daily.     doxycycline 100 MG tablet  Commonly known as:  VIBRA-TABS  Take 1 tablet (100 mg total) by mouth every 12 (twelve) hours.     furosemide 40 MG tablet  Commonly known as:  LASIX  Take 40-80 mg by mouth 2 (two) times daily. Take 2 tablets (80 mg) every morning and 1 tablet (40 mg) between 12pm and pm     HYDROcodone-acetaminophen 5-325 MG per tablet  Commonly known as:  NORCO/VICODIN  Take 1-2 tablets by mouth every 4 (four) hours as needed for pain.     lisinopril 40 MG tablet  Commonly known as:  PRINIVIL,ZESTRIL  Take 1 tablet (40 mg total) by mouth daily.     metoprolol 100 MG tablet  Commonly known as:  LOPRESSOR  Take 1 tablet (100 mg total) by mouth 2 (two) times daily.     OVER THE COUNTER MEDICATION  Place 1 application into the nose at bedtime. Nasal product     PHENObarbital 60 MG tablet  Commonly known as:  LUMINAL  Take 180 mg by mouth at bedtime.     pravastatin 40 MG tablet  Commonly known as:  PRAVACHOL  Take 40 mg by mouth daily.     spironolactone 50 MG tablet  Commonly known as:  ALDACTONE  Take 1 tablet (50 mg total) by mouth daily.     zonisamide 100 MG capsule  Commonly known as:  ZONEGRAN  Take  200 mg by mouth at bedtime.        Disposition and follow-up:   [name] was discharged from [facility] in Stable condition.  At the hospital follow up visit please address:  1.  Please ensure the patient was able to follow up with the [facility] 1 week s/p discharge.  2.  Labs / imaging needed at time of follow-up: Please consider MRI Tibia Fibula Right Wo Contrast to rule out osteomyelitis, if you feel it is indicated  3.  Pending labs/ test needing follow-up: Blood cultures  Follow-up Appointments: Follow-up Information   Follow up with [facility]             . Schedule an appointment as soon as possible for a visit in 1 week. (Please call [phone])    Contact information:   [address] [address] [phone]      Follow up with Ivor Costa, MD On 11/09/2012. (@10 :45)    Specialty:  Internal Medicine   Contact information:   [place] [address] [phone]       Discharge Instructions: Discharge  Orders   Future Appointments Provider Department Dept Phone   11/09/2012 10:45 AM Ivor Costa, MD [place] [facility] [phone]   Future Orders Complete By Expires   Diet - low sodium heart healthy  As directed    Increase activity slowly  As directed    Leave dressing on - Keep it clean, dry, and intact until clinic visit  As directed    Comments:     Please follow up with [facility] in 1 week for evaluation and UNNA boot dressing change      Consultations:  Surgery  Procedures Performed:  Dg Tibia/fibula Right  10/30/2012   CLINICAL DATA:  Open wound, possible osteomyelitis, fall  EXAM: RIGHT TIBIA AND FIBULA - 2 VIEW  COMPARISON:  01/01/2004  FINDINGS: Four views of the right knee submitted. Again noted extensive degenerative changes with progression from prior exam. There is  progression of spurring of femoral condyles. Progression of spurring of tibial plateau. There is diffuse narrowing of joint space most significant medially. Significant narrowing of patellofemoral joint space. Spurring of patella. Mild prepatellar soft tissue swelling. Atherosclerotic calcifications of femoral and popliteal artery. No definite evidence of bone destruction to suggest osteomyelitis. No acute fracture or subluxation.  IMPRESSION: No acute fracture or subluxation. Extensive osteoarthritic changes with progression from prior exam. No definite bone destruction to suggest osteomyelitis.   Electronically Signed   By: Lahoma Crocker   On: 10/30/2012 18:02    Admission HPI: Tyrell Delpino is a 67 y.o. female PMH aortic stenosis, dCHF, HLD, HTN, PVD, CVA, seizure disorder who presents with a chief complaint of right leg wound.  She lost her balance and fell out of bed 2.5 weeks ago, hitting her right shin on her rolling walker. This led to a simple bruise. She was seen be her cardiologist a few days later on 10/16/12. At that time there was no skin breakdown. He told her to keep an eye on it and go to the ED if it became more red or swollen. Over the next few weeks, she states the skin changed color and eventually broke down. She put hydrogen peroxide and bandages on it, but it continued to worsen. It eventually became red, swollen, and warm. Last week it started to became painful. Today, she could not bear weight on the leg 2/2 pain and decided to come to the ED. She endorses decreased po intake over the past couple days 2/2 pain. She denies fevers, chills, chest pain, SOB, abdominal pain, nausea, vomiting, diarrhea. She notes the calf is tender. This has never happened to her before. No history of diabetes.  Pertinent physical exam: Skin: Skin is warm and dry. Lesion (There is a 2x3cm ulcer with central eschar on her anterior right shin, indurated to ~5x6cm, significant warmth, erythema involving  entire lower leg. No flucuence, pus. Pain with direct palpation.) noted.       Hospital Course by problem list:  1. Right pretibial ulcer with central eschar and surrounding cellulitis - The patient reportedly fell out of bed onto her rolling walker ~3 weeks ago, bumping her right shin. She was evaluated by her cardiologist on 10/16/12, and at that time there was no skin breakdown. In subsequent weeks her wound developed into a necrotic ulcer (see description above). She presented with surrounding swelling, pain, erythema, and warmth, consistent with cellulitis. She also had a fever to 102.6, but no other vital sign abnormalities. IV vancomycin and clindamycin were started  in the ED, and IV vancomycin was continued through hospital day 4, when we switched her to PO doxycycline in preparation for discharge. RLE doppler on admission was negative for DVT or Baker's cyst. An x-ray of the lower extremity showed no evidence of osteomyelitis. Surgery was consulted, and recommended no surgical intervention. They placed her in an unna boot. PT was consulted and recommended SNF placement. Her pain was treated with prn Norco and improved throughout the remainder of her hospital course. She had some asymptomatic low-grade fevers to 100.9 during her stay, but no other vital sign abnormalities concerning for SIRS/sepsis. Admission blood cultures show NGTD, and blood cultures repeated on hospital day 3 are pending. The patient was discharged to SNF with a 10 day course of doxycycline 100 mg BID and unna boot. She will follow up with the [facility] in 1 week for dressing change and evaluation.  2. Left foot and ankle pain - The patient first expressed left foot/ankle pain on the day of discharge. The etiology is unknown, but likely represents musculoskeletal pain secondary to working with PT. Acute gout is possible, but she does not have a history of this, and the location of her pain is  more in her medial ankle than in her 1st MTP joint. There is no swelling, warmth, or erythema. DVT is unlikely as she has no calf tenderness and has been on subq heparin. There is no skin breakdown to suggest another ulcer. Outpatient follow up is recommended.  3. Mild confusion - On hospital day 3, physical therapy noted some mild confusion, disorientation, and problems following multi-step commands which may have been due to receiving Norco a few hours prior to the PT consult on top of baseline cognitive dysfunction. The patient does endorse trouble with memory at home and had some evidence of short term memory problems here (not remembering conversations with providers, etc.). On the day of discharge, she was alert and oriented x3.  4. Hypertension - Her blood pressure was stable throughout the hospitalization on her home regimen of amlodipine, lisinopril, metoprolol, lasix.  5. Diastolic congestive heart failure - This is a somewhat new diagnosis, made at her last cardiology visit on 10/16/2012. Her last echocardiogram from 07/17/2012 showed moderate LVH, vigorous systolic function with an estimated EF of 65-70%, and doppler parameters consistent with abnormal left ventricular relaxation (grade 1 diastolic dysfunction). The cardiologist felt that this may have been contributing to her lower extremity edema. We continued her home furosemide, spironolactone, ASA as an inpatient, and she exhibited no signs of CHF exacerbation.  6. Seizure disorder - Her last seizure was ~1 month ago. We continued her home phenobarbital and zonisamide. The patient had no seizure-like activity during the hospital course.   7. Hyperlipidemia - We continued her statin.   Discharge Vitals:   BP 165/75  Pulse 72  Temp(Src) 100.1 F (37.8 C) (Oral)  Resp 20  Ht 5\' 1"  (1.549 m)  Wt 230 lb 1.6 oz (104.373 kg)  BMI 43.5 kg/m2  SpO2 98%  Discharge Labs:  Results for orders placed during the hospital encounter of  10/30/12 (from the past 24 hour(s))  CULTURE, BLOOD (ROUTINE X 2)     Status: None   Collection Time    11/01/12  3:44 PM      Result Value Range   Specimen Description BLOOD RIGHT ANTECUBITAL     Special Requests       Value: BOTTLES DRAWN AEROBIC AND ANAEROBIC 10CC  BLUE, 5CC RED   Culture  Setup Time       Value: 11/01/2012 23:19     Performed at [facility]   Culture       Value:        BLOOD CULTURE RECEIVED NO GROWTH TO DATE CULTURE WILL BE HELD FOR 5 DAYS BEFORE ISSUING A FINAL NEGATIVE REPORT     Performed at [facility]   Report Status PENDING    CULTURE, BLOOD (ROUTINE X 2)     Status: None   Collection Time    11/01/12  3:51 PM      Result Value Range   Specimen Description BLOOD RIGHT FOREARM     Special Requests BOTTLES DRAWN AEROBIC AND ANAEROBIC 10CC     Culture  Setup Time       Value: 11/01/2012 23:19     Performed at [facility]   Culture       Value:        BLOOD CULTURE RECEIVED NO GROWTH TO DATE CULTURE WILL BE HELD FOR 5 DAYS BEFORE ISSUING A FINAL NEGATIVE REPORT     Performed at [facility]   Report Status PENDING    CBC WITH DIFFERENTIAL     Status: Abnormal   Collection Time    11/02/12  8:41 AM      Result Value Range   WBC 4.7  4.0 - 10.5 K/uL   RBC 3.35 (*) 3.87 - 5.11 MIL/uL   Hemoglobin 9.6 (*) 12.0 - 15.0 g/dL   HCT 29.2 (*) 36.0 - 46.0 %   MCV 87.2  78.0 - 100.0 fL   MCH 28.7  26.0 - 34.0 pg   MCHC 32.9  30.0 - 36.0 g/dL   RDW 13.2  11.5 - 15.5 %   Platelets 231  150 - 400 K/uL   Neutrophils Relative % 62  43 - 77 %   Neutro Abs 2.9  1.7 - 7.7 K/uL   Lymphocytes Relative 21  12 - 46 %   Lymphs Abs 1.0  0.7 - 4.0 K/uL   Monocytes Relative 14 (*) 3 - 12 %   Monocytes Absolute 0.7  0.1 - 1.0 K/uL   Eosinophils Relative 2  0 - 5 %   Eosinophils Absolute 0.1  0.0 - 0.7 K/uL   Basophils Relative 1  0 - 1 %   Basophils Absolute 0.0  0.0 - 0.1 K/uL  BASIC METABOLIC PANEL     Status: Abnormal   Collection  Time    11/02/12  8:41 AM      Result Value Range   Sodium 135  135 - 145 mEq/L   Potassium 3.8  3.5 - 5.1 mEq/L   Chloride 101  96 - 112 mEq/L   CO2 22  19 - 32 mEq/L   Glucose, Bld 97  70 - 99 mg/dL   BUN 22  6 - 23 mg/dL   Creatinine, Ser 1.46 (*) 0.50 - 1.10 mg/dL   Calcium 8.7  8.4 - 10.5 mg/dL   GFR calc non Af Amer 36 (*) >90 mL/min   GFR calc Af Amer 42 (*) >90 mL/min    Signed: Lesly Dukes, MD 11/02/2012, 1:31 PM   Time Spent on Discharge: 30 minutes Services Ordered on Discharge: None Equipment Ordered on Discharge: None

## 2012-11-02 NOTE — Progress Notes (Addendum)
Subjective: Patient seen at bedside. She feels well this morning. She is alert and oriented. The pain in her right leg is stable. She denies subjective fevers, chills, abdominal pain, nausea, vomiting, chest pain, shortness of breath. She is complaining this morning of pain in her LEFT dorsal foot and medial ankle, worse with movement and palpation. Denies calf tenderness. She has not had this pain before but notes that for the past few weeks she has been "overusing" her left foot to compensate for the pain in her right. No history of gout. We discussed going to SNF after discharge, and she is amenable.  Objective: Vital signs in last 24 hours: Filed Vitals:   11/01/12 1559 11/01/12 2129 11/02/12 0338 11/02/12 0556  BP:  130/68 165/75   Pulse:  72 72   Temp: 98.4 F (36.9 C) 100 F (37.8 C) 100.9 F (38.3 C) 100.1 F (37.8 C)  TempSrc: Oral Oral Oral   Resp:  18 20   Height:  5\' 1"  (1.549 m)    Weight:  230 lb 1.6 oz (104.373 kg)    SpO2:  95% 98%    Weight change:   Intake/Output Summary (Last 24 hours) at 11/02/12 1029 Last data filed at 11/02/12 [id number]  Gross per 24 hour  Intake    320 ml  Output    150 ml  Net    170 ml    Physical Exam  Constitutional: She is oriented to person, place, and time and well-developed, well-nourished, and in no distress.  HENT:  Head: Normocephalic and atraumatic.  Eyes: Conjunctivae and EOM are normal. Pupils are equal, round, and reactive to light.  Neck: Normal range of motion. Neck supple.  Cardiovascular: Normal rate, regular rhythm. Exam reveals no gallop and no friction rub.  Murmur (3/6 harsh crescendo-descrendo murmur best heard @ RUSB, radiating to the carotids) heard. Left DP pulse intact. Pulmonary/Chest: Effort normal. No respiratory distress. No wheezing. She has no rales. She exhibits no tenderness.  Abdominal: Soft. She exhibits no distension. There is no tenderness.  Musculoskeletal: She exhibits stable edema (1+ left).  There is pain with direct palpation of dorsum of left foot and medial ankle, and pain with passive ROM. No pain with palpation of 1st MTP joint. No warmth, swelling, erythema, skin breakdown. No calf tenderness. Neurological: She is alert and oriented to person, place, and time. She has normal strength. No sensory deficit between right and left toes. GCS score is 15.  Skin: Skin is warm and dry. UNNA boot in place, did not unwrap.   Lab Results: Basic Metabolic Panel:  Recent Labs Lab 11/01/12 0500 11/02/12 0841  NA 137 135  K 4.2 3.8  CL 104 101  CO2 21 22  GLUCOSE 95 97  BUN 25* 22  CREATININE 1.43* 1.46*  CALCIUM 8.9 8.7   CBC:  Recent Labs Lab 11/01/12 0500 11/02/12 0841  WBC 5.5 4.7  NEUTROABS  --  2.9  HGB 9.8* 9.6*  HCT 30.2* 29.2*  MCV 87.8 87.2  PLT 223 231   CBG:  Recent Labs Lab 10/30/12 1529  GLUCAP 113*    Micro Results: Recent Results (from the past 240 hour(s))  CULTURE, BLOOD (ROUTINE X 2)     Status: None   Collection Time    10/30/12  6:02 PM      Result Value Range Status   Specimen Description BLOOD ARM RIGHT   Final   Special Requests BOTTLES DRAWN AEROBIC AND ANAEROBIC 10CC  Final   Culture  Setup Time     Final   Value: 10/31/2012 02:13     Performed at [facility]   Culture     Final   Value:        BLOOD CULTURE RECEIVED NO GROWTH TO DATE CULTURE WILL BE HELD FOR 5 DAYS BEFORE ISSUING A FINAL NEGATIVE REPORT     Performed at [facility]   Report Status PENDING   Incomplete  CULTURE, BLOOD (ROUTINE X 2)     Status: None   Collection Time    10/30/12  6:08 PM      Result Value Range Status   Specimen Description BLOOD RIGHT FOREARM   Final   Special Requests BOTTLES DRAWN AEROBIC ONLY 10CC   Final   Culture  Setup Time     Final   Value: 10/31/2012 02:14     Performed at [facility]   Culture     Final   Value:        BLOOD CULTURE RECEIVED NO GROWTH TO DATE CULTURE WILL BE HELD FOR 5 DAYS BEFORE  ISSUING A FINAL NEGATIVE REPORT     Performed at [facility]   Report Status PENDING   Incomplete  CULTURE, BLOOD (ROUTINE X 2)     Status: None   Collection Time    11/01/12  3:44 PM      Result Value Range Status   Specimen Description BLOOD RIGHT ANTECUBITAL   Final   Special Requests     Final   Value: BOTTLES DRAWN AEROBIC AND ANAEROBIC 10CC BLUE, 5CC RED   Culture  Setup Time     Final   Value: 11/01/2012 23:19     Performed at [facility]   Culture     Final   Value:        BLOOD CULTURE RECEIVED NO GROWTH TO DATE CULTURE WILL BE HELD FOR 5 DAYS BEFORE ISSUING A FINAL NEGATIVE REPORT     Performed at [facility]   Report Status PENDING   Incomplete  CULTURE, BLOOD (ROUTINE X 2)     Status: None   Collection Time    11/01/12  3:51 PM      Result Value Range Status   Specimen Description BLOOD RIGHT FOREARM   Final   Special Requests BOTTLES DRAWN AEROBIC AND ANAEROBIC 10CC   Final   Culture  Setup Time     Final   Value: 11/01/2012 23:19     Performed at [facility]   Culture     Final   Value:        BLOOD CULTURE RECEIVED NO GROWTH TO DATE CULTURE WILL BE HELD FOR 5 DAYS BEFORE ISSUING A FINAL NEGATIVE REPORT     Performed at [facility]   Report Status PENDING   Incomplete   Studies/Results: No results found. Medications: I have reviewed the patient's current medications. Scheduled Meds: . amLODipine  10 mg Oral Daily  . aspirin EC  81 mg Oral Daily  . calcium-vitamin D  2 tablet Oral Q breakfast  . furosemide  40 mg Oral BID  . heparin  5,000 Units Subcutaneous Q8H  . lisinopril  40 mg Oral Daily  . metoprolol  100 mg Oral BID  . PHENObarbital  194.4 mg Oral QHS  . simvastatin  20 mg Oral q1800  . sodium chloride  3 mL Intravenous Q12H  . spironolactone  50 mg Oral  Daily  . vancomycin  1,000 mg Intravenous Q24H  . zonisamide  200 mg Oral QHS   Continuous Infusions:   PRN Meds:.acetaminophen, acetaminophen,  HYDROcodone-acetaminophen  Assessment/Plan: Sherry [name] is a 67 y.o. female PMH aortic stenosis, dCHF, HLD, HTN, PVD, CVA, seizure disorder who presents with a chief complaint of right leg wound.  #Right pretibial ulcer with central eschar and surrounding cellulitis - Patient fell out of bed onto her rolling walker 2.5 weeks ago, bumping her right shin. Wound has progressed from a bruise into a necrotic ulcer over time. She presented with swelling, pain, erythema, warmth, consistent with cellulitis. Pain is stable today. She had some low-grade fevers overnight to 100.9, but feels well. Blood cultures were repeated yesterday; admission blood cultures show NGTD. UNNA boot in place.  - Appreciate surgery recs, no surgical intervention needed, will schedule f/u with wound care center in 1 week - UNNA boot with weekly changes - MRI to evaluate for osteomyelitis; if this is not able to be done today, I would not let it hold up her discharge as she can get it as an outpatient - Nurse to elevate leg - Will d/c IV vancomycin and start po doxycycline 100mg  BID x10 days (last dose will be next Weds 9/24 in the evening) - Heart healthy diet  - Tylenol prn fever  - Norco prn pain - Follow up blood cultures - Consult to social work for SNF placement based on PT recs  #Left foot and ankle pain - Unknown etiology. Acute gout is possible, but she does not have a history of this, and the location of her pain is more in her medial ankle than in her 1st MTP joint. There is no swelling, warmth, or erythema. DVT is unlikely as she has no calf tenderness and has been on subq heparin. There is no skin breakdown to suggest another ulcer. She did work with PT yesterday, so I believe this is musculoskeletal. Strain/fracture is unlikely as they did not stand or walk with her. - Norco prn pain - Continue to monitor  #Mild confusion - Physical therapy noted some mild confusion, disorientation, and problems following  multi-step commands. She did tell me she sometimes has problems with memory at home This morning, she was alert and oriented x3. She did get Norco a few hours prior to the PT visit. Could be medication side effect +/- some baseline cognitive dysfunction. I do think she has some short term memory problems; she did not remember PT visiting until I prompted her. - Continue to monitor  #HTN - Stable.  - Continue home amlodipine, lisinopril, metoprolol   #dCHF - Stable.  - Continue home lasix, spironolactone, ASA   #Seizure disorder - Stable. Last seizure 1 month ago.  - Continue home phenobarbital, zonisamide   #HLD - Continue home pravastatin.  #DVT PPX - subq heparin  Dispo: Disposition is deferred at this time, awaiting improvement of current medical problems.  Anticipated discharge in approximately 1-3 day(s).   The patient does have a current PCP (Charlann Lange, MD) and does need an [facility] hospital follow-up appointment after discharge.  The patient does not have transportation limitations that hinder transportation to clinic appointments.  .Services Needed at time of discharge: Y = Yes, Blank = No PT:   OT:   RN:   Equipment:   Other:     LOS: 3 days   Lesly Dukes, MD 11/02/2012, 10:29 AM

## 2012-11-02 NOTE — Progress Notes (Signed)
Medical Student Daily Progress Note  Subjective: Patient is sitting up in bed, is alert and oriented to person, place, and time. She has noticed that she has been sleepy, but has not specifically noticed fevers or chills. Today both her right and left leg hurt.  She does not remember anyone telling her that she may be going to a skilled nursing facility, but agrees that she would be safer there while undergoing rehabilitation.  Objective: Vital signs in last 24 hours: Filed Vitals:   11/01/12 1559 11/01/12 2129 11/02/12 0338 11/02/12 0556  BP:  130/68 165/75   Pulse:  72 72   Temp: 98.4 F (36.9 C) 100 F (37.8 C) 100.9 F (38.3 C) 100.1 F (37.8 C)  TempSrc: Oral Oral Oral   Resp:  18 20   Height:  5\' 1"  (1.549 m)    Weight:  230 lb 1.6 oz (104.373 kg)    SpO2:  95% 98%     Intake/Output Summary (Last 24 hours) at 11/02/12 1129 Last data filed at 11/02/12 [id number]  Gross per 24 hour  Intake    320 ml  Output    150 ml  Net    170 ml   Physical Exam: General: Well apearing female in no acute distress Heart: Regular rate and rhythm, Harsh 3/6 systolic ejection murmur heard loudest over the right sternal border Lungs: Clear to auscultation bilaterally Abdomen: Obese, soft, non distended, nontender to palpation Extremities: Right leg is wrapped in unna boot and was not examined today. Previous examination of this leg revealed a 2x3 cm eschar surrounded by an erythematous region with no pus, crepitus, or drainage.  Left foot and ankle is extremely tender, but there is no calf tenderness. 2+ DP pulses.  Lab Results: Basic Metabolic Panel:  Recent Labs Lab 11/01/12 0500 11/02/12 0841  NA 137 135  K 4.2 3.8  CL 104 101  CO2 21 22  GLUCOSE 95 97  BUN 25* 22  CREATININE 1.43* 1.46*  CALCIUM 8.9 8.7    Recent Labs Lab 11/01/12 0500 11/02/12 0841  WBC 5.5 4.7  NEUTROABS  --  2.9  HGB 9.8* 9.6*  HCT 30.2* 29.2*  MCV 87.8 87.2  PLT 223 231   Medications: Scheduled  Meds: . amLODipine  10 mg Oral Daily  . aspirin EC  81 mg Oral Daily  . calcium-vitamin D  2 tablet Oral Q breakfast  . doxycycline  100 mg Oral Q12H  . furosemide  40 mg Oral BID  . heparin  5,000 Units Subcutaneous Q8H  . lisinopril  40 mg Oral Daily  . metoprolol  100 mg Oral BID  . PHENObarbital  194.4 mg Oral QHS  . simvastatin  20 mg Oral q1800  . sodium chloride  3 mL Intravenous Q12H  . spironolactone  50 mg Oral Daily  . zonisamide  200 mg Oral QHS   Continuous Infusions:  PRN Meds:.acetaminophen, acetaminophen, HYDROcodone-acetaminophen  Assessment/Plan: #Right lower extremity cellulitis with pretibial ulcer with central eschar: Unna boot placed. s/p fall and injury to her right lower leg during the week of 08/25. She has been mildly febrile over the past day, but not concerning for sepsis. - Switch IV vancomycin to PO doxycycline 100 mg BID for 10 days - MRI right leg today - PT evaluation recommends placement to SNF - LE XR shows no definite bone destruction to suggest osteomyelitis - Surgery consult recommends no surgical intervention at this time - Right lower extremity venous duplex showed no  evidence of DVT or Baker's cyst  - IV fluids medlocked  #Left leg pain: No calf tenderness, so unlikely represents DVT. Could be the beginning stages of gout since the pain is localized to the ankle and metatarsal area, but the patient has no history of gout and there is no warmth or erythema. This most likely represents musculoskeletal pain from overuse to keep weight off of her right leg in the setting of injury.  This is unlikely to be a compartment syndrome since there was no trauma to the left leg and there are strong DP pulses. - Serial clinical exams - Instruct SNF to monitor for signs of acute change  #Hypertension: Blood pressure stable. Continue home blood pressure medications amlodipine 10 mg, metoprolol 100 mg BID, lisinopril 40 mg, furosemide 40 mg BID.   #Diastolic  heart failure: Currently stable. Recent cardiology visit diagnosed diastolic ventricular dysfunction based on most recent echo and suggested that this is contributing to her lower extremity edema.   #Seizure disorder: Her last seizure was several weeks ago. Continue home medications: phenobarbital 194.4 mg BID, zonisamide 200 mg   #Hyperlipidemia: Takes simvastatin 20 mg  # Prophylaxis:  - Heparin injection subcuatenous q8 hours for DVT prophylaxis  #Disposition:  - PT recommends placement to SNF - Social work consult for SNF placement - Discharge this afternoon   LOS: 3 days   This is a Careers information officer Note.  The care of the patient was discussed with Dr. Lucila Maine and the assessment and plan formulated with their assistance.  Please see their attached note for official documentation of the daily encounter.  Sherry [name] 11/02/2012, 11:29 AM

## 2012-11-02 NOTE — Progress Notes (Signed)
  I have seen and examined the patient, and reviewed the daily progress note by Dorthey Sawyer, MS 3 and discussed the care of the patient with them. Please see my progress note from 11/02/2012 for further details regarding assessment and plan.    Signed:  Lesly Dukes, MD 11/02/2012, 12:52 PM

## 2012-11-02 NOTE — Progress Notes (Signed)
  I have seen and examined the patient, and reviewed the daily progress note by Chase Caller, MS 3 and discussed the care of the patient with them. Please see my progress note from 11/02/2012 for further details regarding assessment and plan.    Signed:  Lesly Dukes, MD 11/02/2012, 1:42 PM

## 2012-11-02 NOTE — Progress Notes (Signed)
Clinical Social Work Department BRIEF PSYCHOSOCIAL ASSESSMENT 11/02/2012  Patient:  Sherry [name], Sherry [name]     Account Number:  [id number]     Admit date:  10/30/2012  Clinical Social Worker:  Adair Laundry  Date/Time:  11/02/2012 11:30 AM  Referred by:  Physician  Date Referred:  11/02/2012 Referred for  SNF Placement   Other Referral:   Interview type:  Patient Other interview type:    PSYCHOSOCIAL DATA Living Status:  SIGNIFICANT OTHER Admitted from facility:   Level of care:   Primary support name:  Hall Busing [phone] Primary support relationship to patient:  CHILD, ADULT Degree of support available:   Pt stated she has good amount of support from family    CURRENT CONCERNS Current Concerns  Post-Acute Placement   Other Concerns:    [place] / PLAN CSW informed that pt will not be covered by insurance for CIR. CSW spoke to pt about SNF for ST rehab. Pt was agreeable but very adamant about staying close by. CSW explained SNF search process to pt and told pt we would only look in [facility] if that is what she was requesting. Pt was understanding of process and agreeable to being referred out. CSW to pass on information to unit CSW.   Assessment/plan status:  Psychosocial Support/Ongoing Assessment of Needs Other assessment/ plan:   Information/referral to community resources:   SNF List    PATIENT'S/FAMILY'S RESPONSE TO PLAN OF CARE: Pt was agreeable to SNF search only in [facility] [place], [place]

## 2012-11-02 NOTE — Progress Notes (Signed)
Internal Medicine Attending  Date: 11/02/2012  Patient name: Sherry [name] Medical record number: [id number] Date of birth: Jul 21, 1945 Age: 67 y.o. Gender: female  I saw and evaluated the patient. I reviewed the resident's note by Dr. Lucila Maine and I agree with the resident's findings and plans as documented in her progress note.  I agree with discharging Ms. Sitter to a skilled nursing facility for strengthening and coordination.  Will switch her vancomycin to doxycycline for her peri-lesion cellulitis.  MRI can be completed in the outpatient setting if appropriate.

## 2012-11-02 NOTE — Progress Notes (Signed)
MR tib/fib d/c'd per Dr Lucila Maine telephone order, pt will not need and can be d/c'd to SNF today.

## 2012-11-02 NOTE — Discharge Summary (Signed)
Pt DC to SNF, awaiting ambulance transportation. RLE wound with dressing c/d/i, pt is alert and oriented with no complaints, daughter at bedside, discharge instructions reviewed with pt and written information provided. Report called to [facility] at 1645 to [facility]. [place], Museum/gallery conservator E

## 2012-11-02 NOTE — Progress Notes (Signed)
  Subjective: Pt pleasant without complaints this morning.  Objective: Vital signs in last 24 hours: Temp:  [98.4 F (36.9 C)-100.9 F (38.3 C)] 100.1 F (37.8 C) (09/15 0556) Pulse Rate:  [71-72] 72 (09/15 0338) Resp:  [18-20] 20 (09/15 0338) BP: (130-172)/(66-75) 165/75 mmHg (09/15 0338) SpO2:  [95 %-98 %] 98 % (09/15 0338) Weight:  [230 lb 1.6 oz (104.373 kg)] 230 lb 1.6 oz (104.373 kg) (09/14 2129) Last BM Date: 10/21/12  Intake/Output from previous day: 09/14 0701 - 09/15 0700 In: 320 [P.O.:120; IV Piggyback:200] Out: 150 [Urine:150] Intake/Output this shift:    PE: RLE: Unna boot in place, did not unwrap.  Lab Results:   Recent Labs  10/31/12 0535 11/01/12 0500  WBC 4.9 5.5  HGB 9.9* 9.8*  HCT 29.8* 30.2*  PLT 235 223   BMET  Recent Labs  10/31/12 0535 11/01/12 0500  NA 138 137  K 3.9 4.2  CL 105 104  CO2 22 21  GLUCOSE 88 95  BUN 34* 25*  CREATININE 1.54* 1.43*  CALCIUM 8.3* 8.9   PT/INR No results found for this basename: LABPROT, INR,  in the last 72 hours CMP     Component Value Date/Time   NA 137 11/01/2012 0500   K 4.2 11/01/2012 0500   CL 104 11/01/2012 0500   CO2 21 11/01/2012 0500   GLUCOSE 95 11/01/2012 0500   BUN 25* 11/01/2012 0500   CREATININE 1.43* 11/01/2012 0500   CREATININE 1.69* 07/02/2012 1642   CALCIUM 8.9 11/01/2012 0500   PROT 7.2 07/02/2012 1642   ALBUMIN 4.1 07/02/2012 1642   AST 21 07/02/2012 1642   ALT 14 07/02/2012 1642   ALKPHOS 65 07/02/2012 1642   BILITOT 0.3 07/02/2012 1642   GFRNONAA 37* 11/01/2012 0500   GFRAA 43* 11/01/2012 0500   Lipase  No results found for this basename: lipase       Studies/Results: No results found.  Anti-infectives: Anti-infectives   Start     Dose/Rate Route Frequency Ordered Stop   10/31/12 2000  vancomycin (VANCOCIN) IVPB 1000 mg/200 mL premix     1,000 mg 200 mL/hr over 60 Minutes Intravenous Every 24 hours 10/31/12 1246     10/30/12 1730  vancomycin (VANCOCIN) IVPB 1000  mg/200 mL premix     1,000 mg 200 mL/hr over 60 Minutes Intravenous  Once 10/30/12 1726 10/30/12 2027   10/30/12 1545  clindamycin (CLEOCIN) IVPB 600 mg     600 mg 100 mL/hr over 30 Minutes Intravenous  Once 10/30/12 1539 10/30/12 1926       Assessment/Plan A:  Right anterior tibia traumatic wound with CVI P: Unna boot in place. No further surgical intervention or follow up needed. Patient should f/u with wound care center in 1 week. Surgery team will sign off. Please re-consult if further needs arise.   LOS: 3 days    Chrisandra Netters 11/02/2012, 8:08 AM PA Pager: [phone]

## 2012-11-02 NOTE — Progress Notes (Signed)
Clinical Social Work Department CLINICAL SOCIAL WORK PLACEMENT NOTE 11/02/2012  Patient:  Sherry [name], Sherry [name]  Account Number:  [id number] Admit date:  10/30/2012  Clinical Social Worker:  Berton Mount, [name]  Date/time:  11/02/2012 11:45 AM  Clinical Social Work is seeking post-discharge placement for this patient at the following level of care:   SKILLED NURSING   (*CSW will update this form in Epic as items are completed)   11/02/2012  Patient/family provided with [place] Department of Clinical Social Work's list of facilities offering this level of care within the geographic area requested by the patient (or if unable, by the patient's family).  11/02/2012  Patient/family informed of their freedom to choose among providers that offer the needed level of care, that participate in Medicare, Medicaid or managed care program needed by the patient, have an available bed and are willing to accept the patient.  11/02/2012  Patient/family informed of [facility]' ownership interest in [facility], as well as of the fact that they are under no obligation to receive care at this facility.  PASARR submitted to EDS on 11/02/2012 PASARR number received from EDS on 11/02/2012  FL2 transmitted to all facilities in geographic area requested by pt/family on  11/02/2012 FL2 transmitted to all facilities within larger geographic area on   Patient informed that his/her managed care company has contracts with or will negotiate with  certain facilities, including the following:     Patient/family informed of bed offers received:   Patient chooses bed at  Physician recommends and patient chooses bed at    Patient to be transferred to  on   Patient to be transferred to facility by   The following physician request were entered in Epic:   Additional CommentsBerton Mount, [place]

## 2012-11-02 NOTE — Progress Notes (Signed)
Hospital course by problem:  #Right pretibial ulcer with central eschar and surrounding cellulitis - The patient fell out of bed onto her rolling walker 2.5 weeks ago, bumping her right shin. In the weeks prior to admission, her wound progressed from a bruise into a necrotic ulcer over time. She presented with swelling, pain, erythema, warmth, consistent with cellulitis. IV vancomycin and clindamycin were started in the ED, and IV vancomycin was continued through hospital day 4 when we switched to PO doxycycline in preparation for discharge. An x-ray of the lower extremity showed no evidence of osteomyelitis. Surgery was consulted, and recommended no surgical intervention but placed an unna boot. PT was consulted and recommended SNF placement. Her pain was treated with prn Norco and Tylenol and her pain was stable throughout the remainder of her hospital course. She had some low-grade fevers but no symptoms concerning for sepsis. Admission blood cultures show NGTD and blood cultures were repeated on hospital day 3. The patient was discharged to SNF with a 10 day course of doxycycline 100 mg BID and unna boot.   #Left foot and ankle pain - The patient first expressed left foot pain on the day of discharge. The etiology is unknown, but likely represents musculoskeletal pain secondary to working with PT.  Acute gout is possible, but she does not have a history of this, and the location of her pain is more in her medial ankle than in her 1st MTP joint. There is no swelling, warmth, or erythema. DVT is unlikely as she has no calf tenderness and has been on subq heparin. There is no skin breakdown to suggest another ulcer.  #Mild confusion - On hospital day 3, physical therapy noted some mild confusion, disorientation, and problems following multi-step commands which may have been due to receiving Norco a few hours prior to the PT consult. The patient endorses trouble with memory at home. On the day of discharge,  she was alert and oriented x3 but she showed some deficits with short term memory.  #Hypertension - Her blood pressure was stable throughout the hospitalization on her home regimen of amlodipine, lisinopril, metoprolol.   #Diastolic congestive heart failure - This is a somewhat new diagnosis, made at her last cardiology visit on 10/16/2012. Her last echocardiogram from 07/17/2012 showed moderate LVH, vigorous systolic function with an estimated EF of 65-70%, and doppler parameters consistent with abnormal left ventricular relaxation (grade 1 diastolic dysfunction). The cardiologist felt that this may be contributing to her lower extremity edema. We continued her home furosemide, spironolactone, ASA. Her cardiologist recommended furosemide dosing of 80 mg BID, which is different from what she had been taking on admission (40 mg BID).   #Seizure disorder - Her last seizure was 1 month ago. We continue home phenobarbital, zonisamide.  The patient had no seizure-like activity during the hospital course.  #Hyperlipidemia - We continued her home pravastatin.

## 2012-11-03 NOTE — Progress Notes (Signed)
UR review completed. Jonnie Finner RN CCM

## 2012-11-04 ENCOUNTER — Encounter: Payer: Self-pay | Admitting: Internal Medicine

## 2012-11-06 ENCOUNTER — Encounter: Payer: Self-pay | Admitting: Internal Medicine

## 2012-11-06 LAB — CULTURE, BLOOD (ROUTINE X 2): Culture: NO GROWTH

## 2012-11-09 ENCOUNTER — Ambulatory Visit: Payer: Medicare Other | Admitting: Internal Medicine

## 2012-11-09 LAB — CULTURE, BLOOD (ROUTINE X 2): Culture: NO GROWTH

## 2012-11-10 ENCOUNTER — Telehealth: Payer: Self-pay | Admitting: Internal Medicine

## 2012-11-10 NOTE — Telephone Encounter (Signed)
  INTERNAL MEDICINE RESIDENCY PROGRAM Telephone Call    Reason for call:   I placed an outgoing call to [name] and spoke to her daughter Sherry [name] at 1100 am.  Patient states that she is doing fine and her leg wound dressing was changed by the SNF staff. Her clinic follow up appt on Monday 11/09/12 was cancelled. Patient's daughter states that she has not called the [facility] wound care center.    Last encounter / Pertinent Data:   Patient was hospitalized for evaluation of right pretibial ulcer between 9/12-9/15/14.     Assessment/ Plan:   I instructed patient's daughter to come to the clinic for a follow up appt at 230 pm 11/11/12.  I called the [facility] wound center and made a follow up appt for patient to be seen at 230 pm on Monday 11/16/12 ( this is the earliest appt I could get for patient), and I discussed with patent's daughter about above appts. She agrees with the plan and will bring patient to above appts.    As always, pt is advised that if symptoms worsen or new symptoms arise, they should go to an urgent care facility or to to ER for further evaluation.    Charlann Lange, MD   11/10/2012, 12:06 PM

## 2012-11-11 ENCOUNTER — Ambulatory Visit (INDEPENDENT_AMBULATORY_CARE_PROVIDER_SITE_OTHER): Payer: Medicare Other | Admitting: Internal Medicine

## 2012-11-11 ENCOUNTER — Encounter: Payer: Self-pay | Admitting: Internal Medicine

## 2012-11-11 VITALS — BP 127/68 | HR 51 | Temp 97.5°F

## 2012-11-11 DIAGNOSIS — L02419 Cutaneous abscess of limb, unspecified: Secondary | ICD-10-CM

## 2012-11-11 DIAGNOSIS — R569 Unspecified convulsions: Secondary | ICD-10-CM

## 2012-11-11 DIAGNOSIS — I10 Essential (primary) hypertension: Secondary | ICD-10-CM

## 2012-11-11 DIAGNOSIS — I509 Heart failure, unspecified: Secondary | ICD-10-CM

## 2012-11-11 DIAGNOSIS — I5032 Chronic diastolic (congestive) heart failure: Secondary | ICD-10-CM

## 2012-11-11 DIAGNOSIS — L03115 Cellulitis of right lower limb: Secondary | ICD-10-CM

## 2012-11-11 NOTE — Patient Instructions (Addendum)
1. Please continue to take and complete the 10 day course of doxycycline. 2. Please call us if you develop fever or chills, or if your pain gets worse.   3. Please try to elevate your right leg above your heart if it is not too hurting. 3. If you have worsening of your symptoms or new symptoms arise, please call the clinic [phone]), or go to the ER immediately if symptoms are severe.  You have done great job in taking all your medications. I appreciate it very much. Please continue doing that.  Cellulitis Cellulitis is an infection of the skin and the tissue beneath it. The infected area is usually red and tender. Cellulitis occurs most often in the arms and lower legs.  CAUSES  Cellulitis is caused by bacteria that enter the skin through cracks or cuts in the skin. The most common types of bacteria that cause cellulitis are Staphylococcus and Streptococcus. SYMPTOMS   Redness and warmth.  Swelling.  Tenderness or pain.  Fever. DIAGNOSIS  Your caregiver can usually determine what is wrong based on a physical exam. Blood tests may also be done. TREATMENT  Treatment usually involves taking an antibiotic medicine. HOME CARE INSTRUCTIONS   Take your antibiotics as directed. Finish them even if you start to feel better.  Keep the infected arm or leg elevated to reduce swelling.  Apply a warm cloth to the affected area up to 4 times per day to relieve pain.  Only take over-the-counter or prescription medicines for pain, discomfort, or fever as directed by your caregiver.  Keep all follow-up appointments as directed by your caregiver. SEEK MEDICAL CARE IF:   You notice red streaks coming from the infected area.  Your red area gets larger or turns dark in color.  Your bone or joint underneath the infected area becomes painful after the skin has healed.  Your infection returns in the same area or another area.  You notice a swollen bump in the infected area.  You develop new  symptoms. SEEK IMMEDIATE MEDICAL CARE IF:   You have a fever.  You feel very sleepy.  You develop vomiting or diarrhea.  You have a general ill feeling (malaise) with muscle aches and pains. MAKE SURE YOU:   Understand these instructions.  Will watch your condition.  Will get help right away if you are not doing well or get worse. Document Released: 11/14/2004 Document Revised: 08/06/2011 Document Reviewed: 04/22/2011 [facility] Patient Information 2014 [place].

## 2012-11-11 NOTE — Progress Notes (Signed)
Patient ID: Sherry [name], female   DOB: 06-Jul-1945, 67 y.o.   MRN: [id number] Subjective:   Patient ID: Sherry [name] female   DOB: Feb 08, 1946 67 y.o.   MRN: [id number]  CC:   Hospital followup visit.   HPI:  [name] is a 67 y.o. lady with past medical history as outlined below, who presents for a followup visit today  1. Right pretibial ulcer and surrounding cellulitis: Patient was hospitalized from 9/12 to 9/15 due to right pretibial ulcer and surrounding cellulitis. Her ulcer and cellulitis developed after a injury due to falling onto her rolling walker and bumping her right shin. The ulcer is most likely developed after 8/291/4 when she was seen by her cardiologist and at that time there was no skin breakdown. Patient presented with fever, surrounding swelling, pain, erythema, and warmth. Patient had RLE doppler on admission, which was negative for DVT or Baker's cyst.  An x-ray of the lower extremity showed no evidence of osteomyelitis. Surgery was consulted, and recommended no surgical intervention. Patient was treated with IV vancomycin and clindamycin in hospital for cellulitis and was switched to 10 day course of doxycycline at discharge. Her blood culture was negative. Patient was discharged to SNF per PT recommendation. She was supposed to  follow up with the [facility] in 1 week for dressing change and evaluation, but she missed the appointment. Her PCP, Dr. Nicoletta Dress called the [facility] and made a follow up appt for patient to be seen at 230 pm on Monday 11/16/12.Today patient feels better. She reports that she has been taking all her medications regularly, including doxycycline as prescribed, and her leg pain and leg swelling have improved. She does not have fever or chills.  2. Hypertension - Patient is currently taking amlodipine, lisinopril, metoprolol, spironolactone, and lasix. Her blood pressure is normal today. Does not  have chest pain and palpitation.  3. Diastolic congestive heart failure - Her 2-D echo on 07/17/12 showed EF 65-70% with grade 1 diastolic dysfunction. Patient has history of moderate aortic stenosis. Patient does not have shortness of breath, chest pain or palpitation today.  4. Seizure disorder - We continued her home phenobarbital and zonisamide. The patient had no seizure-like activity during the hospital course. Patient did not have a seizure episode recently.   ROS:  Denies fever, chills, headaches, cough, chest pain, SOB, abdominal pain, diarrhea, constipation, dysuria, urgency, frequency, hematuria.   Past Medical History  Diagnosis Date  . Aortic stenosis   . Hypertension   . Seizure disorder   . Hyperlipidemia    Current Outpatient Prescriptions  Medication Sig Dispense Refill  . amLODipine (NORVASC) 10 MG tablet Take 10 mg by mouth daily.      [name] aspirin 81 MG EC tablet Take 81 mg by mouth daily.       . Calcium Carb-Cholecalciferol (CALCIUM 500 +D) 500-400 MG-UNIT TABS Take 1 tablet by mouth 2 (two) times daily.      [name] doxycycline (VIBRA-TABS) 100 MG tablet Take 1 tablet (100 mg total) by mouth every 12 (twelve) hours.  20 tablet  0  . furosemide (LASIX) 40 MG tablet Take 40-80 mg by mouth 2 (two) times daily. Take 2 tablets (80 mg) every morning and 1 tablet (40 mg) between 12pm and pm      . HYDROcodone-acetaminophen (NORCO/VICODIN) 5-325 MG per tablet Take 1-2 tablets by mouth every 4 (four) hours as needed for pain.  30 tablet  0  .  lisinopril (PRINIVIL,ZESTRIL) 40 MG tablet Take 1 tablet (40 mg total) by mouth daily.  30 tablet  11  . metoprolol (LOPRESSOR) 100 MG tablet Take 1 tablet (100 mg total) by mouth 2 (two) times daily.  180 tablet  1  . OVER THE COUNTER MEDICATION Place 1 application into the nose at bedtime. Nasal product      . PHENObarbital (LUMINAL) 60 MG tablet Take 180 mg by mouth at bedtime.      . pravastatin (PRAVACHOL) 40 MG tablet Take 40 mg by mouth  daily.      [name] spironolactone (ALDACTONE) 50 MG tablet Take 1 tablet (50 mg total) by mouth daily.  30 tablet  11  . zonisamide (ZONEGRAN) 100 MG capsule Take 200 mg by mouth at bedtime.       No current facility-administered medications for this visit.   No family history on file. History   Social History  . Marital Status: Married    Spouse Name: N/A    Number of Children: N/A  . Years of Education: N/A   Social History Main Topics  . Smoking status: Former Smoker    Quit date: 06/03/2000  . Smokeless tobacco: None  . Alcohol Use: No  . Drug Use: No  . Sexual Activity: None   Other Topics Concern  . None   Social History Narrative  . None    Review of Systems: Full 14-point review of systems otherwise negative. See HPI.   Objective:  Physical Exam: Filed Vitals:   11/11/12 1438  BP: 127/68  Pulse: 51  Temp: 97.5 F (36.4 C)  TempSrc: Oral  SpO2: 98%    General: Not in acute distress HEENT: PERRL, EOMI, no scleral icterus, No JVD or bruit Cardiac: S1/S2, RRR, 3/6 harsh crescendo-descrendo murmur best heard over the aortic valve area. no gallop and no friction rub.  Pulm: Good air movement bilaterally. Clear to auscultation bilaterally. No rales, wheezing, rhonchi or rubs. Abd: Soft, nondistended, nontender, no rebound pain, no organomegaly, BS present Ext:  2+ pitting edema on the right root and 1+ pitting edema on the right leg. There is trace edema on the left leg.  Musculoskeletal: No joint deformities, erythema, or stiffness, ROM full Skin: There is an ulcer with central eschar on her anterior right shin, approximately 2 X 3 cm in size. The ulcer looks not deep. There is a little serous sanguinous fluid coming out from ulcer. No flucuence or pus. There is mild warmth and redness surrounding the ulcer. Pain with direct palpation.   Neuro: Alert and oriented X3, cranial nerves II-XII grossly intact, muscle strength 5/5 in all extremeties, sensation to light  touch intact.  Psych: Patient is not psychotic, no suicidal or hemocidal ideation.  Assessment & Plan:

## 2012-11-11 NOTE — Assessment & Plan Note (Signed)
Patient is currently taking doxycycline. Patient's leg ulcer and cellulitis are in healing process. Her pain and swelling improved. There is a little serous sanguinous fluid coming out from ulcer. No fluctuance or pus. Her ulcer looks not deep. Patient has no fever or chills. No signs of sepsis. Dr. Ellwood Dense also evaluated patient's wound. We both agreed that patient's wound does not look like deep enough for osteomyelitis. There is no need for MRI at this moment.  -Will let patient complete 10 day course of doxycycline.  -will follow up in one week. If wound does not heal well or get worse, will consider MRI to rule out osteomyelitis. -Patient has an appointment with [facility] on 11/16/12. -Patient is instructed to come back if she develops fever, chills or worsening pain -patient is instructed to elevate her right leg above heart to reduce swelling.

## 2012-11-11 NOTE — Assessment & Plan Note (Signed)
It is stable. Patient did not have new episode of seizure recently. She is currently taking phenobarbital and zonisamide. Will continue current regimen

## 2012-11-11 NOTE — Assessment & Plan Note (Signed)
Patient 2-D echo on 07/17/12 showed EF 65-70% with grade 1 diastolic dysfunction. Patient also has history of moderate aortic stenosis. Currently patient does not have signs of CHF exacerbation. Her right leg edema is obviously caused by cellulitis. Her left leg does not have significant edema. She does not have JVD. Her lung auscultation is clear bilaterally. He is euvolemic today.  -will continue current regimen.

## 2012-11-11 NOTE — Assessment & Plan Note (Signed)
Patient's blood pressure is normal today. Will continue amlodipine, Lasix, lisinopril, metoprolol, and spironolactone.

## 2012-11-12 NOTE — Progress Notes (Signed)
I saw and evaluated the patient.  I personally confirmed the key portions of the history and exam documented by Dr. Blaine Hamper and I reviewed pertinent patient test results.  The assessment, diagnosis, and plan were formulated together and I agree with the documentation in the resident's note.

## 2012-11-16 ENCOUNTER — Encounter (HOSPITAL_BASED_OUTPATIENT_CLINIC_OR_DEPARTMENT_OTHER): Payer: Medicare Other | Attending: General Surgery

## 2012-11-16 DIAGNOSIS — S99919A Unspecified injury of unspecified ankle, initial encounter: Secondary | ICD-10-CM | POA: Insufficient documentation

## 2012-11-16 DIAGNOSIS — X58XXXA Exposure to other specified factors, initial encounter: Secondary | ICD-10-CM | POA: Insufficient documentation

## 2012-11-16 DIAGNOSIS — S8990XA Unspecified injury of unspecified lower leg, initial encounter: Secondary | ICD-10-CM | POA: Insufficient documentation

## 2012-11-23 ENCOUNTER — Encounter (HOSPITAL_BASED_OUTPATIENT_CLINIC_OR_DEPARTMENT_OTHER): Payer: Medicare Other | Attending: Plastic Surgery

## 2012-11-23 DIAGNOSIS — I10 Essential (primary) hypertension: Secondary | ICD-10-CM | POA: Insufficient documentation

## 2012-11-23 DIAGNOSIS — L97809 Non-pressure chronic ulcer of other part of unspecified lower leg with unspecified severity: Secondary | ICD-10-CM | POA: Insufficient documentation

## 2012-11-23 DIAGNOSIS — E785 Hyperlipidemia, unspecified: Secondary | ICD-10-CM | POA: Insufficient documentation

## 2012-11-23 DIAGNOSIS — Z79899 Other long term (current) drug therapy: Secondary | ICD-10-CM | POA: Insufficient documentation

## 2012-11-23 DIAGNOSIS — I509 Heart failure, unspecified: Secondary | ICD-10-CM | POA: Insufficient documentation

## 2012-11-23 DIAGNOSIS — G40909 Epilepsy, unspecified, not intractable, without status epilepticus: Secondary | ICD-10-CM | POA: Insufficient documentation

## 2012-11-24 NOTE — Progress Notes (Signed)
[facility]  NAME:  Sherry [name], Sherry [name]           ACCOUNT NO.:  [id number]  MEDICAL RECORD NO.:  [id number]      DATE OF BIRTH:  07/08/1945  PHYSICIAN:  Theodoro Kos, DO       VISIT DATE:  11/23/2012                                  OFFICE VISIT   The patient is a 67 year old female who is here for followup on her right lower extremity traumatic ulcer from falling.  She had it debrided on her last visit and apparently had quite a bit of purulence with positive cultures.  She was switched from doxycycline to Cipro.  She looks much better today and is showing some very good signs of healing.  REVIEW OF SYSTEMS:  Otherwise negative.  PAST MEDICAL HISTORY:  Positive for pretibial ulcer, hypertension, congestive heart failure, seizure disorder, aortic stenosis, hyperlipidemia, she has had a hysterectomy.  MEDICATIONS:  Include Norvasc, aspirin, calcium, Lasix, Vicodin, Prinivil, Lopressor, Luminal, Pravachol, Aldactone, and Zonegran.  She does not have any allergies.  PHYSICAL EXAMINATION:  GENERAL:  On exam, she is alert, oriented, cooperative, not in any acute distress.  She is here with her family. EYES:  Her pupils are equal. CHEST:  Her breathing is unlabored. HEART:  Heart rate is regular.  The wound was debrided sharply with a 10 blade and overall it looks very good.  We will continue with the Santyl, but we will add a collagen.     Theodoro Kos, DO     CS/MEDQ  D:  11/23/2012  T:  11/24/2012  Job:  [id number]

## 2012-11-30 ENCOUNTER — Encounter: Payer: Self-pay | Admitting: Internal Medicine

## 2012-12-07 ENCOUNTER — Encounter: Payer: Self-pay | Admitting: Internal Medicine

## 2012-12-08 ENCOUNTER — Other Ambulatory Visit: Payer: Self-pay | Admitting: Internal Medicine

## 2012-12-08 ENCOUNTER — Telehealth: Payer: Self-pay | Admitting: *Deleted

## 2012-12-08 DIAGNOSIS — R2991 Unspecified symptoms and signs involving the musculoskeletal system: Secondary | ICD-10-CM

## 2012-12-08 NOTE — Progress Notes (Signed)
[facility]  NAME:  Sherry [name], Sherry [name]           ACCOUNT NO.:  [id number]  MEDICAL RECORD NO.:  [id number]      DATE OF BIRTH:  October 14, 1945  PHYSICIAN:  Theodoro Kos, DO       VISIT DATE:  12/07/2012                                  OFFICE VISIT   The patient is a 67 year old female who is here for followup on her right lower extremity traumatic ulcer after falling.  She had it debrided on her 1 of her visit.  Overall, the area is looking a little bit better.  She was switched from doxycycline to Cipro which I think has shown some signs of improvement.  There has been no change in her medical history and her review of systems is negative.  Medications are unchanged.  On exam, she is alert, oriented, and cooperative.  She is very pleasant. Her pupils are equal.  Her chest is unlabored in her breathing.  Her heart rate is regular.  The wound is located on her left leg and is 3.1 x 1.5 x 0.1 cm in size.  Her pulses are present.  She does have some swelling.  Was recommended elevation, multivitamin, vitamin C, zinc.  We will continue with dressing changes, and a wrap, Kerlix, Coban her right leg and we will see her back in followup.     Theodoro Kos, DO     CS/MEDQ  D:  12/07/2012  T:  12/08/2012  Job:  [id number]

## 2012-12-08 NOTE — Telephone Encounter (Signed)
Yes. Thanks I have ordered [facility] agency for PT/OT/RN/Aid evaluation.  Dr. Nicoletta Dress

## 2012-12-08 NOTE — Telephone Encounter (Signed)
PT calls to continue to see pt for 2 weeks x 6 for transfer training and lower extremity strengthening exercises- verbal given, do you approve?

## 2012-12-09 NOTE — Telephone Encounter (Signed)
CSW will fax complete order to [facility] on 12/09/12.

## 2012-12-11 NOTE — Telephone Encounter (Signed)
Pt is not current with [facility].  CSW placed call to [facility], order faxed to [employer].

## 2012-12-16 ENCOUNTER — Telehealth: Payer: Self-pay | Admitting: *Deleted

## 2012-12-16 NOTE — Telephone Encounter (Signed)
Home OT calls for verb ok to continue OT for adl's, safety issues in home, strengthening and medicine management, ok given, hope you approve

## 2012-12-17 NOTE — Telephone Encounter (Signed)
Agree with the plan. Thank you  Dr. Nicoletta Dress

## 2012-12-28 ENCOUNTER — Encounter (HOSPITAL_BASED_OUTPATIENT_CLINIC_OR_DEPARTMENT_OTHER): Payer: Medicare Other | Attending: Plastic Surgery

## 2012-12-28 DIAGNOSIS — L97409 Non-pressure chronic ulcer of unspecified heel and midfoot with unspecified severity: Secondary | ICD-10-CM | POA: Insufficient documentation

## 2012-12-29 NOTE — Progress Notes (Signed)
[facility]  NAME:  Sherry [name], Sherry [name]           ACCOUNT NO.:  [id number]  MEDICAL RECORD NO.:  [id number]      DATE OF BIRTH:  1946-01-20  PHYSICIAN:  Theodoro Kos, DO       VISIT DATE:  12/28/2012                                  OFFICE VISIT   The patient is a 67 year old female who is here for followup on her right lower extremity ulcer on the anterior aspect.  She is doing extremely well and is healing in very nicely.  There has been no change in her medications or social history.  On exam, she is alert, oriented, cooperative, very pleased with the results.  She has a compression stocking on her left leg.  Her breathing is unlabored.  Her heart rate is regular.  Her abdomen is large but soft.  The swelling has improved in her lower extremities.  Recommend continuing with the SilverCel and following up in 1 week.     Theodoro Kos, DO     CS/MEDQ  D:  12/28/2012  T:  12/29/2012  Job:  [id number]

## 2013-01-07 ENCOUNTER — Telehealth: Payer: Self-pay | Admitting: *Deleted

## 2013-01-07 NOTE — Telephone Encounter (Signed)
[facility] OT calls and states she needs some orders to continue [facility] assistance, verbal for continuing OT 1x/ week for 3 weeks for help doing tub transfers. She also needs a tub transfer bench.  Do you approve, a verbal approval was left on OT vmail, she is to call back if she needs a written order for the bench.  done

## 2013-01-08 NOTE — Telephone Encounter (Signed)
Ordered approved. Thanks. Dr. Nicoletta Dress

## 2013-01-11 ENCOUNTER — Encounter: Payer: Self-pay | Admitting: Internal Medicine

## 2013-01-11 NOTE — Progress Notes (Signed)
[facility]  NAME:  Sherry [name], Sherry [name]           ACCOUNT NO.:  [id number]  MEDICAL RECORD NO.:  [id number]      DATE OF BIRTH:  04-Dec-1945  PHYSICIAN:  Theodoro Kos, DO       VISIT DATE:  01/11/2013                                  OFFICE VISIT   The patient is a 67 year old female who is here for followup on her right lower extremity ulcer.  She is actually healed and looking very good.  She is wearing compression stockings which is helping great deal. She has good family support.  She is alert, awake, not in any acute distress.  Recommend continue with the stockings, elevation, multivitamin, vitamin C, zinc, and follow up as needed.     Theodoro Kos, DO     CS/MEDQ  D:  01/11/2013  T:  01/11/2013  Job:  [id number]

## 2013-01-25 ENCOUNTER — Ambulatory Visit (INDEPENDENT_AMBULATORY_CARE_PROVIDER_SITE_OTHER): Payer: Medicare Other | Admitting: Internal Medicine

## 2013-01-25 VITALS — BP 134/64 | HR 73 | Temp 97.1°F | Wt 218.0 lb

## 2013-01-25 DIAGNOSIS — L02419 Cutaneous abscess of limb, unspecified: Secondary | ICD-10-CM

## 2013-01-25 DIAGNOSIS — I6350 Cerebral infarction due to unspecified occlusion or stenosis of unspecified cerebral artery: Secondary | ICD-10-CM

## 2013-01-25 DIAGNOSIS — L03115 Cellulitis of right lower limb: Secondary | ICD-10-CM

## 2013-01-25 DIAGNOSIS — I509 Heart failure, unspecified: Secondary | ICD-10-CM

## 2013-01-25 DIAGNOSIS — I5032 Chronic diastolic (congestive) heart failure: Secondary | ICD-10-CM

## 2013-01-25 DIAGNOSIS — I6381 Other cerebral infarction due to occlusion or stenosis of small artery: Secondary | ICD-10-CM

## 2013-01-25 DIAGNOSIS — I10 Essential (primary) hypertension: Secondary | ICD-10-CM

## 2013-01-25 DIAGNOSIS — D649 Anemia, unspecified: Secondary | ICD-10-CM | POA: Insufficient documentation

## 2013-01-25 DIAGNOSIS — R569 Unspecified convulsions: Secondary | ICD-10-CM

## 2013-01-25 LAB — POCT GLYCOSYLATED HEMOGLOBIN (HGB A1C): Hemoglobin A1C: 5.8

## 2013-01-25 NOTE — Assessment & Plan Note (Signed)
Assessment CBC    Component Value Date/Time   WBC 4.7 11/02/2012 0841   RBC 3.35* 11/02/2012 0841   HGB 9.6* 11/02/2012 0841   HCT 29.2* 11/02/2012 0841   PLT 231 11/02/2012 0841   MCV 87.2 11/02/2012 0841   MCH 28.7 11/02/2012 0841   MCHC 32.9 11/02/2012 0841   RDW 13.2 11/02/2012 0841   LYMPHSABS 1.0 11/02/2012 0841   MONOABS 0.7 11/02/2012 0841   EOSABS 0.1 11/02/2012 0841   BASOSABS 0.0 11/02/2012 0841   Patient is asymptomatic. Denies fatigue, SOB and chest discomfort. Her HH is ~ 9/29.  Plan - will check Anemia panel and TSH

## 2013-01-25 NOTE — Assessment & Plan Note (Signed)
Assessment: Stable. Denies SOB, orthopnea or PND. Weight down 17 lbs since last Dec.  Reports medical compliance.  Plan: - continue current medications - follow up with her cardiologist Dr. Burt Knack as scheduled.  - thorough discussion on salt and fluid restriction. Verbal and written instructions given to patient.

## 2013-01-25 NOTE — Progress Notes (Signed)
Patient: Sherry [name]   MRN: [id number]  DOB: 28-Apr-1945  PCP: Charlann Lange, MD   Subjective:    CC: Hypertension and Follow-up   HPI: Sherry [name] is a 67 y.o. female with a PMHx of morbid obesity,HTN, CKD stage 3, chronic diastolic HF, CVA, and seizure disorder, who presented to clinic today for the following:  # HTN    Patient is currently taking amlodipine, lisinopril, metoprolol, spironolactone, and lasix. Her blood pressure is normal today. Does not have chest pain and palpitation.  # Chronic diastolic heart failure    Denies SOB, orthopnea, or PND. Sleeps on two pillows without problems. Weight down 17 lbs since last Dec. She is not ambulatory much and uses power W/C for morbility. She has been managed by [facility]. Next appt in Feb, 2014.  # Right pretibial ulcer   She has been managed by [facility] wound care center and was discharged two weeks since "my ulcer is healed".   Health maintenance  # Mammogram- daughter needs to schedule.  # Colonoscopy-- patient will need talk to her Neurologist and Cardiologist. Suspect risk is more than benefit given her comorbilities.   Review of Systems: Review of Systems:  Constitutional:  Denies fever, chills, diaphoresis, appetite change and fatigue.   HEENT:  Denies congestion, sore throat, rhinorrhea, sneezing, mouth sores, trouble swallowing, neck pain   Respiratory:  Denies SOB, DOE, cough, and wheezing.   Cardiovascular:  Denies palpitations and leg swelling.   Gastrointestinal:  Denies nausea, vomiting, abdominal pain, diarrhea, constipation, blood in stool and abdominal distention.   Genitourinary:  Denies dysuria, urgency, frequency, hematuria, flank pain and difficulty urinating.   Musculoskeletal:  Denies myalgias, back pain, joint swelling, arthralgias and gait problem.   Skin:  Denies pallor, rash and wound.   Neurological:  Denies dizziness, seizures, syncope, weakness, light-headedness, numbness and  headaches.    .     Current Outpatient Medications: Current Outpatient Prescriptions  Medication Sig Dispense Refill  . amLODipine (NORVASC) 10 MG tablet Take 10 mg by mouth daily.      [name] aspirin 81 MG EC tablet Take 81 mg by mouth daily.       . Calcium Carb-Cholecalciferol (CALCIUM 500 +D) 500-400 MG-UNIT TABS Take 1 tablet by mouth 2 (two) times daily.      . furosemide (LASIX) 40 MG tablet Take 40-80 mg by mouth 2 (two) times daily. Take 2 tablets (80 mg) every morning and 1 tablet (40 mg) between 12pm and pm      . HYDROcodone-acetaminophen (NORCO/VICODIN) 5-325 MG per tablet Take 1-2 tablets by mouth every 4 (four) hours as needed for pain.  30 tablet  0  . lisinopril (PRINIVIL,ZESTRIL) 40 MG tablet Take 1 tablet (40 mg total) by mouth daily.  30 tablet  11  . metoprolol (LOPRESSOR) 100 MG tablet Take 1 tablet (100 mg total) by mouth 2 (two) times daily.  180 tablet  1  . OVER THE COUNTER MEDICATION Place 1 application into the nose at bedtime. Nasal product      . PHENobarbital (LUMINAL) 97.2 MG tablet Take 97.2 mg by mouth at bedtime.      . pravastatin (PRAVACHOL) 40 MG tablet Take 40 mg by mouth daily.      [name] spironolactone (ALDACTONE) 50 MG tablet Take 1 tablet (50 mg total) by mouth daily.  30 tablet  11  . zonisamide (ZONEGRAN) 100 MG capsule Take 200 mg by mouth at bedtime.  No current facility-administered medications for this visit.    Allergies: Allergies  Allergen Reactions  . Ibuprofen Other (See Comments)    Dr advised pt not to take ibuprofen    Past Medical History  Diagnosis Date  . Aortic stenosis   . Hypertension   . Seizure disorder   . Hyperlipidemia     Objective:    Physical Exam:  Filed Vitals:   01/25/13 0949 01/25/13 1001  BP: 145/70 134/64  Pulse: 73   Temp: 97.1 F (36.2 C)   TempSrc: Oral   Weight: 218 lb (98.884 kg)   SpO2: 100%      General: Vital signs reviewed and noted. Well-developed, well-nourished, in no acute  distress; alert, appropriate and cooperative throughout examination.  Head: Normocephalic, atraumatic.  Lungs:  Normal respiratory effort. Clear to auscultation BL without crackles or wheezes.  Heart: RRR. S1 and S2 normal without gallop, rubs. Right 2nd intercostal space 3/6 systolic murmur.  Abdomen:  BS normoactive. Soft, Nondistended, non-tender.  No masses or organomegaly.  Extremities: No pretibial edema. Bilateral stocking on. Right LE dressing.    Assessment/ Plan:

## 2013-01-25 NOTE — Assessment & Plan Note (Signed)
Assessment:  Patient has a history of seizure disorders. She has been managed by her neurologist Dr. Mallie Darting at [facility]. Her phenobarbital was decreased to 97.2 mg po daily to reduce her oversedation. She reports tha she is doing well, and has not had seizure activity activity for years.  Plan: 1. Medication dose updated on Epic. 2. Continue to follow up with her neurologist

## 2013-01-25 NOTE — Assessment & Plan Note (Signed)
BP Readings from Last 3 Encounters:  01/25/13 134/64  11/11/12 127/68  11/02/12 151/80    Lab Results  Component Value Date   Marilynne Dupuis 135 11/02/2012   K 3.8 11/02/2012   CREATININE 1.46* 11/02/2012    Assessment: Blood pressure control: controlled Progress toward BP goal:  at goal             Reports medical compliance.  Plan: Medications:  continue current medications Educational resources provided:   Self management tools provided:   1. Continue current therapy.

## 2013-01-25 NOTE — Patient Instructions (Addendum)
1. Continue your medications 2. Continue all your appt with your neurologist and cardiologist 3. Follow up in 3 months.  Sodium and Fluid Restriction Some health conditions may require you to restrict your sodium and fluid intake. Sodium is part of the salt in the blood. Sodium may be restricted because when you take in a lot of salt, you become thirsty. Limiting salt with help you become less thirsty and may make it easier to restrict fluid. Talk to your caregiver or dietician about how many cups of fluid and how many milligrams of sodium you are allowed each day. If your caregiver has restricted your sodium and fluids, usually the amount you can drink depends on several things, such as:  Your urine output.  How much fluid you are retaining.  Your blood pressure. Every 2 cups (500 mL) of fluid retained in the body becomes an extra 1 pound (0.5 kg) of body weight. The following are examples of some fluids you will have to restrict:  Tea, coffee, soda, lemonade, milk, water, and juice.  Alcoholic beverages.  Cream.  Gravy.  Ice cubes.  Soup and broth. The following are foods that become liquid at room temperature. These foods will count towards your fluid intake.  Ice cream and ice milk.  Frozen yogurt and sherbet.  Frozen ice pops.  Flavored gelatin. YOU MAY BE TAKING IN TOO MUCH FLUID IF:  Your weight increases.  Your face, hands, legs, feet, and abdomen start to swell.  You have trouble breathing. HOME CARE INSTRUCTIONS If you follow a low sodium diet closely, you will eat approximately 1,500 mg of sodium a day.   Avoid salty foods. This increases your thirst and makes fluid control more difficult. Foods high in sodium include:  Most canned foods, including most meats.  Most processed foods, including most meats.  Cheese.  Dried pasta and rice mixes.  Snack foods (chips, popcorn, pretzels, cheese puffs, salted nuts).  Dips, sauces, and salad dressings.  Do  not use salt in cooking or add salt to your meal. Lacinda Axon with herbs and spices, but not those that have salt in the name. Ask your caregiver if it is okay to use salt substitutes.  Eat home-prepared meals. Use fresh ingredients. Avoid canned, frozen, or packaged meals.  Read food labels to see how much sodium is in the food. Know how much sodium you are allowed each day.  When eating out, ask for dressings and sauces on the side.  Weigh yourself every morning with an empty bladder before you eat or drink. If your weight is going up, you are retaining fluid.  Freeze fruit juice or water in an ice cube tray. Use this as part of your fluid allowance.  Brush your teeth often or rinse your mouth with mouthwash to help your dry mouth. Lemon wedges, hard sour candies, chewing gum, or breath spray may help to moisten your mouth too.  Add a slice of fresh lemon or lemon juice to water or ice. This helps satisfy your thirst.  Try frozen fruits between meals, such as grapes or strawberries.  Swallow your pills along with meals or soft foods. This helps you save your fluid for something you enjoy.  Use small cups and glasses and learn to sip fluids slowly.  Keep your home cooler. Keep the air in your home as humid as possible. Dry air increases thirst.  Avoid being out in the hot sun. Each morning, fill a jug with the amount of water you  are allowed for the day. You can use this water as a guideline for fluid allowance. Each time you take in fluid, pour an equal amount of water out of the container. This helps you to see how much fluid you are taking in. It also helps plan your fluid intake for the rest of the day. CONVERSIONS TO HELP MEASURE FLUID INTAKE  1 cup equals 8 oz (240 mL).   cup equals 6 oz (180 mL).   cup equals 5  oz (160 mL).   cup equals 4 oz (120 mL).   cup equals 2  oz (80 mL).   cup equals 2 oz (60 mL).  2 tbs equals 1 oz (30 mL). Document Released: 12/02/2006 Document  Revised: 08/06/2011 Document Reviewed: 07/19/2011 [facility] Patient Information 2014 [place], [place].

## 2013-01-25 NOTE — Assessment & Plan Note (Signed)
Assessment: Patient reports that her pretibial ulcer is healed and she is discharged from [facility]. She continues to have stocking and dressing on for better healing. [place] staff assess and change it weekly.   Plan: - continue [place] care. - patient is instructed to call the clinic

## 2013-01-26 ENCOUNTER — Telehealth: Payer: Self-pay | Admitting: Licensed Clinical Social Worker

## 2013-01-26 LAB — ANEMIA PANEL
ABS Retic: 33 10*3/uL (ref 19.0–186.0)
Iron: 96 ug/dL (ref 42–145)
Retic Ct Pct: 0.8 % (ref 0.4–2.3)
UIBC: 120 ug/dL — ABNORMAL LOW (ref 125–400)
Vitamin B-12: 607 pg/mL (ref 211–911)

## 2013-01-26 NOTE — Progress Notes (Signed)
Case discussed with Dr. Nicoletta Dress at time of visit.  We reviewed the resident's history and exam and pertinent patient test results.  I agree with the assessment, diagnosis, and plan of care documented in the resident's note.

## 2013-01-26 NOTE — Telephone Encounter (Signed)
I called patient to remind her that it was time for a mammogram, she states that she was having heart trouble and was advised that she did not have to have the mammogram at this time.

## 2013-01-27 ENCOUNTER — Other Ambulatory Visit: Payer: Self-pay | Admitting: Internal Medicine

## 2013-02-01 ENCOUNTER — Telehealth: Payer: Self-pay | Admitting: Cardiovascular Disease

## 2013-02-01 NOTE — Telephone Encounter (Signed)
New Problem    Sherry Olp RN [employer] pt is in the [facility] program, Rn called to get pt's ejection fraction and date of it?

## 2013-02-01 NOTE — Telephone Encounter (Signed)
Left message on Linda's voicemail to return call.

## 2013-02-01 NOTE — Telephone Encounter (Signed)
Called Marin Olp, RN, [facility] program RN. She had called in requesting patient's Ejection Fraction and the date. Provided this information (65% LV EF in May, 2014). [employer] [facility] is a program for heart failure patients. They provide them with a scale and provide assessments/follow up to patients on Heart Failure education and contact them regularly to assist them with heart failure treatment compliance and information.

## 2013-02-01 NOTE — Telephone Encounter (Signed)
Follow up  Sherry [name] called back for the information, please give her a call.

## 2013-02-06 ENCOUNTER — Other Ambulatory Visit: Payer: Self-pay | Admitting: Internal Medicine

## 2013-02-09 ENCOUNTER — Telehealth: Payer: Self-pay | Admitting: *Deleted

## 2013-02-09 ENCOUNTER — Telehealth: Payer: Self-pay | Admitting: Nurse Practitioner

## 2013-02-09 ENCOUNTER — Telehealth: Payer: Self-pay | Admitting: Internal Medicine

## 2013-02-09 MED ORDER — METOPROLOL TARTRATE 50 MG PO TABS: 50.0000 mg | ORAL_TABLET | Freq: Two times a day (BID) | ORAL | Status: DC

## 2013-02-09 NOTE — Telephone Encounter (Signed)
Received call from [place] with [facility] who states she saw patient today for wound care to lower extremity. States she noted that patient's HR was 35-48 bpm.  Dawn states that the patient denies dizziness, light-headedness; states patient does c/o being a little tired.  Dawn states patient reports that low heart rate has occurred before and then it returns to normal shortly after. Patient's PCP's office spoke with the patient and advised patient to go to Urgent Care since there are no appointments available today at their office - pt refused.  Patient was advised to call our office.  I am sending message to Dr. Burt Knack for advice.

## 2013-02-09 NOTE — Telephone Encounter (Signed)
If asymptomatic, we'll bring her in for an EKG and reduce metoprolol to 50 mg twice daily.

## 2013-02-09 NOTE — Telephone Encounter (Signed)
Received call from [facility] stating that the patient's HR is in the upper 30's. I called the patient at home and she states other than mild fatigue, she feels all right. She denies CP, SOB, dizziness. Denies pre-syncope. I have asked her to be seen in urgent care today, as we have no available appointments in clinic, but she declines at this time. I have asked her to call 911/EMS if she has any SOB, dizziness, CP. She understands. I have asked that the [facility] call  Cards Dr. Antionette Char office to see if they have available appointments.  Dominic Pea, DO, [facility] 02/09/2013, 10:42 AM

## 2013-02-09 NOTE — Telephone Encounter (Signed)
Spoke with patient who states she is feeling well; patient denies dizziness, fatigue or light-headedness.  Patient states she does not have anyone that can drive her here for an ekg.  She states that she may be able to have someone bring her on Monday.   Patient verbalized understanding to reduce Metoprolol to 50 mg BID.  New Rx sent to [facility]; patient aware that she can break 100 mg tablets in half until she has finished that bottle.   I advised patient that since she has transportation issues that I will have a triage nurse call her Friday to check on her and we will schedule an ekg for Monday if patient is able to find transportation; patient agrees to call office with worsening symptoms or to seek emergency medical attention.  Patient verbalized agreement and understanding.

## 2013-02-09 NOTE — Telephone Encounter (Signed)
Message left on recording in clinic from [facility] with [facility] - heart rate 35-48. All other vital sign were WNL.  Talked with Dr Murlean Caller and Dr Murlean Caller called pt - only c/o was fatique. Suggest for Dawn to call [facility] - sees Dr Burt Knack. Unable to reach [facility] at [phone] - call [facility] office and talked with Marliss Coots and will give message to [facility]. Hilda Blades Sammantha Mehlhaff RN 02/09/13 11AM

## 2013-02-12 NOTE — Telephone Encounter (Signed)
Called in response to note on 02/09/13 by Duaine Dredge.  Pt states she does feel better. Is trying to get more rest.  Will come in [place] for nurse visit for EKG.  States her daughter can bring her.  No dizziness or light headedness. Has been taking Metoprolol 50 mg BID.  Will bring meds with her to visit on Monday.  To call back if has any concerns or worsening symptoms

## 2013-02-15 ENCOUNTER — Emergency Department (HOSPITAL_COMMUNITY): Payer: Medicare Other

## 2013-02-15 ENCOUNTER — Ambulatory Visit: Payer: Medicare Other

## 2013-02-15 ENCOUNTER — Encounter (HOSPITAL_COMMUNITY): Payer: Self-pay | Admitting: Emergency Medicine

## 2013-02-15 ENCOUNTER — Inpatient Hospital Stay (HOSPITAL_COMMUNITY)
Admission: EM | Admit: 2013-02-15 | Discharge: 2013-02-18 | DRG: 309 | Disposition: A | Discharge status: Home / Self Care | Payer: Medicare Other | Attending: Internal Medicine | Admitting: Internal Medicine

## 2013-02-15 ENCOUNTER — Ambulatory Visit (INDEPENDENT_AMBULATORY_CARE_PROVIDER_SITE_OTHER): Payer: Medicare Other | Admitting: *Deleted

## 2013-02-15 DIAGNOSIS — I129 Hypertensive chronic kidney disease with stage 1 through stage 4 chronic kidney disease, or unspecified chronic kidney disease: Secondary | ICD-10-CM | POA: Diagnosis present

## 2013-02-15 DIAGNOSIS — I10 Essential (primary) hypertension: Secondary | ICD-10-CM | POA: Diagnosis present

## 2013-02-15 DIAGNOSIS — Z6841 Body Mass Index (BMI) 40.0 and over, adult: Secondary | ICD-10-CM

## 2013-02-15 DIAGNOSIS — I441 Atrioventricular block, second degree: Secondary | ICD-10-CM

## 2013-02-15 DIAGNOSIS — I359 Nonrheumatic aortic valve disorder, unspecified: Secondary | ICD-10-CM

## 2013-02-15 DIAGNOSIS — I498 Other specified cardiac arrhythmias: Secondary | ICD-10-CM

## 2013-02-15 DIAGNOSIS — Z79899 Other long term (current) drug therapy: Secondary | ICD-10-CM

## 2013-02-15 DIAGNOSIS — I350 Nonrheumatic aortic (valve) stenosis: Secondary | ICD-10-CM

## 2013-02-15 DIAGNOSIS — Z87891 Personal history of nicotine dependence: Secondary | ICD-10-CM

## 2013-02-15 DIAGNOSIS — R001 Bradycardia, unspecified: Secondary | ICD-10-CM

## 2013-02-15 DIAGNOSIS — I509 Heart failure, unspecified: Secondary | ICD-10-CM | POA: Diagnosis present

## 2013-02-15 DIAGNOSIS — D649 Anemia, unspecified: Secondary | ICD-10-CM | POA: Diagnosis present

## 2013-02-15 DIAGNOSIS — N179 Acute kidney failure, unspecified: Secondary | ICD-10-CM | POA: Diagnosis present

## 2013-02-15 DIAGNOSIS — I5032 Chronic diastolic (congestive) heart failure: Secondary | ICD-10-CM | POA: Diagnosis present

## 2013-02-15 DIAGNOSIS — G40909 Epilepsy, unspecified, not intractable, without status epilepticus: Secondary | ICD-10-CM | POA: Diagnosis present

## 2013-02-15 DIAGNOSIS — Z7401 Bed confinement status: Secondary | ICD-10-CM

## 2013-02-15 DIAGNOSIS — D638 Anemia in other chronic diseases classified elsewhere: Secondary | ICD-10-CM | POA: Diagnosis present

## 2013-02-15 DIAGNOSIS — N183 Chronic kidney disease, stage 3 unspecified: Secondary | ICD-10-CM | POA: Diagnosis present

## 2013-02-15 DIAGNOSIS — N189 Chronic kidney disease, unspecified: Secondary | ICD-10-CM | POA: Diagnosis present

## 2013-02-15 DIAGNOSIS — N184 Chronic kidney disease, stage 4 (severe): Secondary | ICD-10-CM | POA: Diagnosis present

## 2013-02-15 DIAGNOSIS — N289 Disorder of kidney and ureter, unspecified: Secondary | ICD-10-CM

## 2013-02-15 DIAGNOSIS — E785 Hyperlipidemia, unspecified: Secondary | ICD-10-CM | POA: Diagnosis present

## 2013-02-15 DIAGNOSIS — Z7982 Long term (current) use of aspirin: Secondary | ICD-10-CM

## 2013-02-15 HISTORY — DX: Bradycardia, unspecified: R00.1

## 2013-02-15 HISTORY — DX: Chronic kidney disease, unspecified: N18.9

## 2013-02-15 HISTORY — DX: Chronic diastolic (congestive) heart failure: I50.32

## 2013-02-15 HISTORY — DX: Atrioventricular block, second degree: I44.1

## 2013-02-15 HISTORY — DX: Localized edema: R60.0

## 2013-02-15 HISTORY — DX: Morbid (severe) obesity due to excess calories: E66.01

## 2013-02-15 HISTORY — DX: Anemia, unspecified: D64.9

## 2013-02-15 LAB — CBC
HCT: 31 % — ABNORMAL LOW (ref 36.0–46.0)
Hemoglobin: 10.1 g/dL — ABNORMAL LOW (ref 12.0–15.0)
MCV: 86.8 fL (ref 78.0–100.0)
Platelets: 207 10*3/uL (ref 150–400)
RBC: 3.57 MIL/uL — ABNORMAL LOW (ref 3.87–5.11)
RDW: 14.6 % (ref 11.5–15.5)
WBC: 3.6 10*3/uL — ABNORMAL LOW (ref 4.0–10.5)

## 2013-02-15 LAB — CBC WITH DIFFERENTIAL/PLATELET
Basophils Relative: 1 % (ref 0–1)
HCT: 33.5 % — ABNORMAL LOW (ref 36.0–46.0)
Hemoglobin: 10.9 g/dL — ABNORMAL LOW (ref 12.0–15.0)
Lymphocytes Relative: 44 % (ref 12–46)
Lymphs Abs: 1.8 10*3/uL (ref 0.7–4.0)
MCV: 87 fL (ref 78.0–100.0)
Monocytes Absolute: 0.4 10*3/uL (ref 0.1–1.0)
Monocytes Relative: 11 % (ref 3–12)
Neutro Abs: 1.6 10*3/uL — ABNORMAL LOW (ref 1.7–7.7)
Neutrophils Relative %: 42 % — ABNORMAL LOW (ref 43–77)
RBC: 3.85 MIL/uL — ABNORMAL LOW (ref 3.87–5.11)
WBC: 3.9 10*3/uL — ABNORMAL LOW (ref 4.0–10.5)

## 2013-02-15 LAB — CREATININE, SERUM
Creatinine, Ser: 2.84 mg/dL — ABNORMAL HIGH (ref 0.50–1.10)
GFR calc Af Amer: 19 mL/min — ABNORMAL LOW (ref >90)
GFR calc non Af Amer: 16 mL/min — ABNORMAL LOW (ref >90)

## 2013-02-15 LAB — COMPREHENSIVE METABOLIC PANEL
ALT: 15 U/L (ref 0–35)
CO2: 23 mEq/L (ref 19–32)
Calcium: 9.8 mg/dL (ref 8.4–10.5)
Chloride: 103 mEq/L (ref 96–112)
Creatinine, Ser: 2.84 mg/dL — ABNORMAL HIGH (ref 0.50–1.10)
GFR calc Af Amer: 19 mL/min — ABNORMAL LOW (ref >90)
GFR calc non Af Amer: 16 mL/min — ABNORMAL LOW (ref >90)
Glucose, Bld: 98 mg/dL (ref 70–99)

## 2013-02-15 LAB — MAGNESIUM: Magnesium: 2.5 mg/dL (ref 1.5–2.5)

## 2013-02-15 LAB — PRO B NATRIURETIC PEPTIDE: Pro B Natriuretic peptide (BNP): 9842 pg/mL — ABNORMAL HIGH (ref 0–125)

## 2013-02-15 LAB — MRSA PCR SCREENING: MRSA by PCR: POSITIVE — AB

## 2013-02-15 LAB — GLUCOSE, CAPILLARY: Glucose-Capillary: 137 mg/dL — ABNORMAL HIGH (ref 70–99)

## 2013-02-15 MED ORDER — SIMVASTATIN 20 MG PO TABS
20.0000 mg | ORAL_TABLET | Freq: Every day | ORAL | Status: DC
  Administered 2013-02-16 – 2013-02-17 (×2): 20 mg via ORAL
  Filled 2013-02-15 (×3): qty 1

## 2013-02-15 MED ORDER — SODIUM CHLORIDE 0.9 % IV SOLN: 250.0000 mL | INTRAVENOUS | Status: DC | PRN

## 2013-02-15 MED ORDER — SODIUM CHLORIDE 0.9 % IV BOLUS (SEPSIS)
250.0000 mL | Freq: Once | INTRAVENOUS | Status: AC
  Administered 2013-02-15: 12:00:00 via INTRAVENOUS

## 2013-02-15 MED ORDER — PHENOBARBITAL 97.2 MG PO TABS
97.2000 mg | ORAL_TABLET | Freq: Every day | ORAL | Status: DC
  Administered 2013-02-15 – 2013-02-17 (×3): 97.2 mg via ORAL
  Filled 2013-02-15 (×3): qty 1

## 2013-02-15 MED ORDER — SODIUM CHLORIDE 0.9 % IJ SOLN: 3.0000 mL | INTRAMUSCULAR | Status: DC | PRN

## 2013-02-15 MED ORDER — SODIUM CHLORIDE 0.9 % IJ SOLN
3.0000 mL | Freq: Two times a day (BID) | INTRAMUSCULAR | Status: DC
  Administered 2013-02-15 – 2013-02-17 (×5): 3 mL via INTRAVENOUS

## 2013-02-15 MED ORDER — AMLODIPINE BESYLATE 10 MG PO TABS
10.0000 mg | ORAL_TABLET | Freq: Every day | ORAL | Status: DC
  Administered 2013-02-16: 10 mg via ORAL
  Filled 2013-02-15 (×2): qty 1

## 2013-02-15 MED ORDER — SODIUM CHLORIDE 0.9 % IV SOLN
INTRAVENOUS | Status: DC
  Administered 2013-02-15: 12:00:00 via INTRAVENOUS

## 2013-02-15 MED ORDER — ENOXAPARIN SODIUM 40 MG/0.4ML ~~LOC~~ SOLN
40.0000 mg | SUBCUTANEOUS | Status: DC
  Administered 2013-02-15: 40 mg via SUBCUTANEOUS
  Filled 2013-02-15: qty 0.4

## 2013-02-15 MED ORDER — ACETAMINOPHEN 325 MG PO TABS
650.0000 mg | ORAL_TABLET | ORAL | Status: DC | PRN
  Administered 2013-02-17: 650 mg via ORAL
  Filled 2013-02-15: qty 2

## 2013-02-15 MED ORDER — ASPIRIN EC 81 MG PO TBEC
81.0000 mg | DELAYED_RELEASE_TABLET | Freq: Every day | ORAL | Status: DC
  Administered 2013-02-16 – 2013-02-18 (×3): 81 mg via ORAL
  Filled 2013-02-15 (×3): qty 1

## 2013-02-15 MED ORDER — ZONISAMIDE 100 MG PO CAPS
200.0000 mg | ORAL_CAPSULE | Freq: Every day | ORAL | Status: DC
  Administered 2013-02-15 – 2013-02-17 (×2): 200 mg via ORAL
  Filled 2013-02-15 (×5): qty 2

## 2013-02-15 MED ORDER — SPIRONOLACTONE 50 MG PO TABS
50.0000 mg | ORAL_TABLET | Freq: Every day | ORAL | Status: DC
  Filled 2013-02-15: qty 1

## 2013-02-15 MED ORDER — ONDANSETRON HCL 4 MG/2ML IJ SOLN: 4.0000 mg | Freq: Four times a day (QID) | INTRAMUSCULAR | Status: DC | PRN

## 2013-02-15 NOTE — ED Notes (Signed)
Family at bedside. 

## 2013-02-15 NOTE — ED Notes (Signed)
Gave pt. A box lunch and gingerale

## 2013-02-15 NOTE — ED Notes (Signed)
Pt. Resting comfortably, denies any pain or sob.  Pt. Stated, "I'm tired.  "  Will continue to monitor

## 2013-02-15 NOTE — ED Notes (Signed)
Pt sent from MD office for a low heart rate , pt here with a HR of 30 , no chest pain , no sob

## 2013-02-15 NOTE — H&P (Signed)
Patient ID: Sherry [name] MRN: [id number], DOB/AGE: [birth year]   Admit date: 02/15/2013  EP consult/ admit  Primary Physician: Charlann Lange, MD Primary Cardiologist: Dr. Burt Knack  Pt. Profile: This is a 67 year old woman who is basically nonambulatory with a history of chronic fatigue, LE edema, chronic diastolic heart failure, HTN, CKD, and seizure disorder who is being evaluated today for asymptomatic bradycardia x 1week. She was sent from [facility] heartcare's [address] office to the ED today for 2:1 heart block, HR 29.  The patient complains of chronic leg swelling, right more than left after a fall and chronic fatigue. Patient denies any chest pain, SOB, palpitations, lightheadedness, dizziness. She denies abdominal pain, n/v, pre syncope, syncope, recent illness, fevers or chills. She takes amlodipine 10 mg, lasix 40 mg BID, metoprorol 50 mg BID, lisinopril 40 mg, spironolactone 50 mg,  phenobarbital 97.2 QHS. She took all her scheduled medications this morning. She was on 100 mg metoporol BID, but it was recently decreased to 50 after phone correspondences with Dr. Burt Knack on 02/09/13. She is followed by Dr. Burt Knack for AS and chronic diastolic heart failure. 06/2012 ECHO demonstrated a severely calcified aortic valve with moderate aortic stenosis by Doppler criteria. The aortic valve leaflets appeared severely restricted. 65-70% with grade 1 diastolic dysfunction.    Today EKG reveals 2:1 heart block, HR 29.  BNP 9,842, Troponin neg,  H/H: 10.9/ 33.5 normocytic anemia, WBC 3.9, RBC 3.85, HgA1c normal  Problem List  Past Medical History  Diagnosis Date  . Aortic stenosis     a. 06/2012 ECHO: severly calcified AV, with with mod AS by Doppler criteria. Sig LVH  . Hypertension   . Seizure disorder   . Hyperlipidemia   . Chronic diastolic heart failure     a. 06/2012 ECHO: EF Q000111Q, grd 1 distolic dysfxn, severly calcified AV, with with mod AS by Doppler criteria. Sig LVH  . CKD  (chronic kidney disease)     a. baseline CKD stage III  . Morbid obesity     History reviewed. No pertinent past surgical history.   Allergies  Allergies  Allergen Reactions  . Ibuprofen Other (See Comments)    Dr advised pt not to take ibuprofen    Home Medications  Prior to Admission medications   Medication Sig Start Date End Date Taking? Authorizing Provider  amLODipine (NORVASC) 10 MG tablet Take 10 mg by mouth daily.    Historical Provider, MD  aspirin 81 MG EC tablet Take 81 mg by mouth daily.     Historical Provider, MD  Calcium Carb-Cholecalciferol (CALCIUM 500 +D) 500-400 MG-UNIT TABS Take 1 tablet by mouth 2 (two) times daily.    Historical Provider, MD  furosemide (LASIX) 40 MG tablet TAKE TWO TABLETS BY MOUTH IN THE MORNING--& 1 IN THE EVENING 02/06/13   Na Li, MD  lisinopril (PRINIVIL,ZESTRIL) 40 MG tablet Take 1 tablet (40 mg total) by mouth daily. 05/11/12   Charlann Lange, MD  metoprolol (LOPRESSOR) 50 MG tablet Take 1 tablet (50 mg total) by mouth 2 (two) times daily. 02/09/13   Sherren Mocha, MD  OVER THE COUNTER MEDICATION Place 1 application into the nose at bedtime. Nasal product    Historical Provider, MD  PHENobarbital (LUMINAL) 97.2 MG tablet Take 97.2 mg by mouth at bedtime.    Historical Provider, MD  pravastatin (PRAVACHOL) 40 MG tablet Take 40 mg by mouth daily.    Historical Provider, MD  spironolactone (ALDACTONE) 50 MG tablet Take 1 tablet (  50 mg total) by mouth daily. 07/03/12   Charlann Lange, MD  zonisamide (ZONEGRAN) 100 MG capsule Take 200 mg by mouth at bedtime.    Historical Provider, MD    Family History  History reviewed. No pertinent family history.  Social History  History   Social History  . Marital Status: Married    Spouse Name: N/A    Number of Children: N/A  . Years of Education: N/A   Occupational History  . Not on file.   Social History Main Topics  . Smoking status: Former Smoker    Quit date: 06/03/2000  . Smokeless tobacco: Not on  file  . Alcohol Use: No  . Drug Use: No  . Sexual Activity: Not on file   Other Topics Concern  . Not on file   Social History Narrative  . No narrative on file      Physical Exam  Blood pressure 149/40, pulse 33, temperature 97.5 F (36.4 C), temperature source Oral, resp. rate 11, height 5\' 1"  (1.549 m), weight 218 lb (98.884 kg), SpO2 100.00%.   General: Pt is alert and oriented, chronically ill-appearing, pleasant obese woman in NAD HEENT: normal  Neck: JVP normal. Carotid upstrokes normal with bilateral bruits. No thyromegaly.  Lungs: CTAB CV:  RRR with grade 3/6 harsh systolic crescendo decrescendo murmur at the right upper sternal border that radiates to the carotids Abd: soft, NT, +BS, no bruit, no hepatosplenomegaly  Back: no CVA tenderness  Ext: trace edema, wearing compression stockings Neuro: CNII-XII intact. The patient is alert and oriented, good insight    Labs  Recent Labs  02/15/13 1125  TROPONINI <0.30   Lab Results  Component Value Date   WBC 3.9* 02/15/2013   HGB 10.9* 02/15/2013   HCT 33.5* 02/15/2013   MCV 87.0 02/15/2013   PLT 227 02/15/2013     Recent Labs Lab 02/15/13 1125  NA 139  K 5.2*  CL 103  CO2 23  BUN 86*  CREATININE 2.84*  CALCIUM 9.8  PROT 8.0  BILITOT 0.2*  ALKPHOS 64  ALT 15  AST 19  GLUCOSE 98    Radiology/Studies  Dg Chest Port 1 View  02/15/2013   CLINICAL DATA:  Bradycardia  EXAM: PORTABLE CHEST - 1 VIEW  COMPARISON:  None.  FINDINGS: Cardiac shadow is mildly enlarged. A monitoring device is noted over the midline. The lungs are clear. No bony abnormality is noted.  IMPRESSION: No acute abnormality noted.     ECHO Study Date: 07/17/2012 ------------------------------------------------------ LV EF: 65% - 70% ------------------------------------------------------------ Indications: Edema 782.3. ------------------------------------------------------------ History: Risk factors: Former tobacco use.  Hypertension. Morbidly obese. Dyslipidemia. ------------------------------------------------------------ Study Conclusions - Left ventricle: The cavity size was normal. Wall thickness was increased in a pattern of moderate LVH. Systolic function was vigorous. The estimated ejection fraction was in the range of 65% to 70%. Wall motion was normal; there were no regional wall motion abnormalities. Doppler parameters are consistent with abnormal left ventricular relaxation (grade 1 diastolic dysfunction). - Aortic valve: Probably trileaflet; moderately calcified leaflets. There was moderate stenosis. Mean gradient: 37mm Hg (S). Peak gradient:41mm Hg (S). Valve area: 1.08cm^2(VTI). Valve area: 1.04cm^2 (Vmax). - Mitral valve: Moderately calcified annulus. Trivial regurgitation. - Left atrium: The atrium was mildly dilated. - Right ventricle: The cavity size was normal. Systolic function was normal. - Pulmonary arteries: No complete TR doppler jet so unable to estimate PA systolic pressure. - Inferior vena cava: The vessel was normal in size; the respirophasic diameter changes were  in the normal range (= 50%); findings are consistent with normal central venous pressure. - Pericardium, extracardiac: Small circumferential pericardial effusion. Impressions: - Normal LV size with moderate LV hypertrophy. EF 65-70%. At least moderate aortic stenosis with mean gradient 27 mmHg. Normal RV size and systolic function. Would consider TEE or further TTE images to make sure stenosis is not severe (valve is significantly restricted on 2-D images).- ------------------------------------------------------------ Left ventricle: The cavity size was normal. Wall thickness was increased in a pattern of moderate LVH. Systolic function was vigorous. The estimated ejection fraction was in the range of 65% to 70%. Wall motion was normal; there were no regional wall motion abnormalities. Doppler parameters  are consistent with abnormal left ventricular relaxation (grade 1 diastolic dysfunction). ------------------------------------------------------------ Aortic valve: Probably trileaflet; moderately calcified leaflets. Doppler: There was moderate stenosis. No regurgitation. VTI ratio of LVOT to aortic valve: 0.38. Valve area: 1.08cm^2(VTI). Indexed valve area: 0.49cm^2/m^2 (VTI). Peak velocity ratio of LVOT to aortic valve: 0.37. Valve area: 1.04cm^2 (Vmax). Indexed valve area: 0.47cm^2/m^2 (Vmax). Mean gradient: 14mm Hg (S). Peak gradient:44mm Hg (S). ------------------------------------------------------------ Aorta: Aortic root: The aortic root was normal in size. Ascending aorta: The ascending aorta was normal in size. ------------------------------------------------------------ Mitral valve: Moderately calcified annulus. Doppler: There was no evidence for stenosis. Trivial regurgitation. Peak gradient: 17mm Hg (D). ------------------------------------------------------------ Left atrium: The atrium was mildly dilated. ------------------------------------------------------------ Right ventricle: The cavity size was normal. Systolic function was normal. ------------------------------------------------------------ Pulmonic valve: Structurally normal valve. Cusp separation was normal. Doppler: Transvalvular velocity was within the normal range. No regurgitation. ------------------------------------------------------------ Tricuspid valve: Doppler: No significant regurgitation. ------------------------------------------------------------ Pulmonary artery: No complete TR doppler jet so unable to estimate PA systolic pressure. ------------------------------------------------------------ Right atrium: The atrium was normal in size. ------------------------------------------------------------ Pericardium: Small circumferential pericardial  effusion. ------------------------------------------------------------ Systemic veins: Inferior vena cava: The vessel was normal in size; the respirophasic diameter changes were in the normal range (= 50%); findings are consistent with normal central venous pressure.   ECG  Chart/ EKG 2:1 heart block, HR 29 bpm.   ASSESSMENT AND PLAN Active Problems:   Bradycardia   Hyperlipidemia   Hypertension   Seizure disorder   Bilateral lower extremity edema   Morbid obesity with BMI of 40.0-44.9, adult   CKD (chronic kidney disease) stage 3, GFR 30-59 ml/min   Aortic stenosis, moderate   Chronic diastolic congestive heart failure   Anemia  This is a 67 year old woman who is basically nonambulatory with a history of chronic fatigue, LE edema, chronic diastolic heart failure, HTN, CKD, and seizure disorder who is being evaluated today for asymptomatic bradycardia x 1week. She was sent from [facility] heartcare's [address] office to the ED today for 2:1 heart block, HR 29.  Bradycardia- She took all medications this AM. Transfer to step down  -- Hemodynamically stable, asymptomatic  -- Will likely require pacemaker placement -- To remain in bed, foley placed -- Check TSH  -- Will check a phenobarbital/albumin level (taken for seizures) as this can cause bradycardia   HTN-  BP consistantly elevated. Currently 159/44. She took all medications this AM, may normalize when paced -- On amlodipine 10 mg, lasix 40 mg BID, metoprorol 50 mg BID, lisinopril 40 mg, spironolactone 50 mg -- Hold metoprolol 2/2 bradycardia, Hold ACE and Lasix 2/2 AKI   Chronic diastolic heart failure- 0000000 ECHO with EF Q000111Q, grade 1 distolic dysfxn, severly calcified AV, with with moderate  AS by Doppler criteria. -- Hold Lasix 40 BID in the setting of acute kidney injury  and AS -- Strict I/Os, daily weights -- Restrict Na and H20 -- Pending repeat ECHO  AKI-- Cr elevated today 2.84, baseline ~1.5  -- Gentle  hydration in the setting of chronic lower extremity swelling and diastolic dysfunction -- Hold Lasix for no, Hold ACE -- Continue to monitor kidney function  Aortic stenosis- in very poor general medical health and is not a good candidate for AVR even if AS progresses per Dr. Antionette Char note.  -- Continue to monitor, hold Lasix as we do not want to compromise preload -- Pending repeat ECHO  Chronic LE swelling- R>L, no SOB, bradycardic, Sp02 100% on RA.   Anemia- H/H: 10.9/ 33.5 normocytic anemia-- likely anemia of chronic disease   HLD- continue pravastatin   Signed,  Perry Mount, PA-C 02/15/2013, 2:55 PM  I have seen, examined the patient, and reviewed the above assessment and plan.  Changes to above are made where necessary.  The patient presents with symptomatic 2:1 AV block.  She has acute on chronic renal failure and elevated BP.  At this time, we will stop metoprolol and allow it to wash out.  If her AV block persists then she will require PPM.  Her EF has previously been normal, though she has at least moderate AS.  We will gently hydration/ maintain preload.  She will be watched closely in TCU.  Co Sign: Thompson Grayer, MD 02/15/2013 3:41 PM

## 2013-02-15 NOTE — ED Notes (Signed)
Family at bedside.foley has been inserted

## 2013-02-15 NOTE — ED Notes (Signed)
Md in to see patient, pt is asymptomatic at this time , on monitor and hooked up to zoll pads

## 2013-02-15 NOTE — Progress Notes (Signed)
Pt arrived with family member in a wheelchair, no signs of distress. ekg completed while sitting in WC. Pt states she feels ok/ denies dizziness, no SOB. Chart/ EKG reviewed by DOD Dr Johnsie Cancel. Pt is in 2:1 heart block, HR 29 bpm. pt to go to ED now and may go via family member. ED was called to inform, Trish notified.

## 2013-02-15 NOTE — Progress Notes (Signed)
Unit CM UR Completed by [facility] ED CM  W. Jori Thrall RN  

## 2013-02-15 NOTE — ED Provider Notes (Signed)
CSN: [id number]     Arrival date & time 02/15/13  1056 History   First MD Initiated Contact with Patient 02/15/13 1126     Chief Complaint  Patient presents with  . Irregular Heart Beat   (Consider location/radiation/quality/duration/timing/severity/associated sxs/prior Treatment) The history is provided by the patient.   67 year old female followed by outpatient clinics. Has a home nurse come 3 times a week today noticed that her heart rate was very slow patient without shortness of breath or chest pain or fatigue or any symptoms. No new medications. Patient is on Lopressor but recently it was decreased. Patient had a heart rate in the 30s patient referred from there to her doctor's office for doctor's office to here. Patient completely asymptomatic.  Past Medical History  Diagnosis Date  . Aortic stenosis   . Hypertension   . Seizure disorder   . Hyperlipidemia    History reviewed. No pertinent past surgical history. History reviewed. No pertinent family history. History  Substance Use Topics  . Smoking status: Former Smoker    Quit date: 06/03/2000  . Smokeless tobacco: Not on file  . Alcohol Use: No   OB History   Grav Para Term Preterm Abortions TAB SAB Ect Mult Living                 Review of Systems  Constitutional: Negative for fever and fatigue.  HENT: Negative for congestion.   Eyes: Negative for visual disturbance.  Respiratory: Negative for shortness of breath.   Cardiovascular: Negative for chest pain.  Gastrointestinal: Negative for nausea, vomiting and abdominal pain.  Genitourinary: Negative for dysuria.  Musculoskeletal: Negative for back pain.  Skin: Negative for rash.  Neurological: Negative for headaches.  Hematological: Does not bruise/bleed easily.  Psychiatric/Behavioral: Negative for confusion.    Allergies  Ibuprofen  Home Medications   Current Outpatient Rx  Name  Route  Sig  Dispense  Refill  . amLODipine (NORVASC) 10 MG tablet  Oral   Take 10 mg by mouth daily.         [name] aspirin 81 MG EC tablet   Oral   Take 81 mg by mouth daily.          . Calcium Carb-Cholecalciferol (CALCIUM 500 +D) 500-400 MG-UNIT TABS   Oral   Take 1 tablet by mouth 2 (two) times daily.         . furosemide (LASIX) 40 MG tablet      TAKE TWO TABLETS BY MOUTH IN THE MORNING--& 1 IN THE EVENING   90 tablet   2   . lisinopril (PRINIVIL,ZESTRIL) 40 MG tablet   Oral   Take 1 tablet (40 mg total) by mouth daily.   30 tablet   11   . metoprolol (LOPRESSOR) 50 MG tablet   Oral   Take 1 tablet (50 mg total) by mouth 2 (two) times daily.   180 tablet   1   . OVER THE COUNTER MEDICATION   Nasal   Place 1 application into the nose at bedtime. Nasal product         . PHENobarbital (LUMINAL) 97.2 MG tablet   Oral   Take 97.2 mg by mouth at bedtime.         . pravastatin (PRAVACHOL) 40 MG tablet   Oral   Take 40 mg by mouth daily.         [name] spironolactone (ALDACTONE) 50 MG tablet   Oral   Take 1 tablet (50 mg total)  by mouth daily.   30 tablet   11   . zonisamide (ZONEGRAN) 100 MG capsule   Oral   Take 200 mg by mouth at bedtime.          BP 130/53  Pulse 32  Temp(Src) 97.7 F (36.5 C) (Oral)  Resp 18  Ht 5\' 1"  (1.549 m)  Wt 218 lb (98.884 kg)  BMI 41.21 kg/m2  SpO2 100% Physical Exam  Nursing note and vitals reviewed. Constitutional: She is oriented to person, place, and time. She appears well-developed and well-nourished. No distress.  HENT:  Head: Normocephalic and atraumatic.  Mouth/Throat: Oropharynx is clear and moist.  Eyes: Conjunctivae and EOM are normal. Pupils are equal, round, and reactive to light.  Neck: Normal range of motion.  Cardiovascular: Normal heart sounds.   Marked bradycardia irregular.  Pulmonary/Chest: Effort normal and breath sounds normal. No respiratory distress.  Abdominal: Soft. Bowel sounds are normal. There is no tenderness.  Musculoskeletal: Normal range of  motion. She exhibits no edema.  Neurological: She is alert and oriented to person, place, and time. No cranial nerve deficit. She exhibits normal muscle tone. Coordination normal.  Skin: Skin is warm. No rash noted.    ED Course  Procedures (including critical care time) Labs Review Labs Reviewed  COMPREHENSIVE METABOLIC PANEL - Abnormal; Notable for the following:    Potassium 5.2 (*)    BUN 86 (*)    Creatinine, Ser 2.84 (*)    Total Bilirubin 0.2 (*)    GFR calc non Af Amer 16 (*)    GFR calc Af Amer 19 (*)    All other components within normal limits  CBC WITH DIFFERENTIAL - Abnormal; Notable for the following:    WBC 3.9 (*)    RBC 3.85 (*)    Hemoglobin 10.9 (*)    HCT 33.5 (*)    Neutrophils Relative % 42 (*)    Neutro Abs 1.6 (*)    All other components within normal limits  PRO B NATRIURETIC PEPTIDE - Abnormal; Notable for the following:    Pro B Natriuretic peptide (BNP) 9842.0 (*)    All other components within normal limits  TROPONIN I   Imaging Review Dg Chest Port 1 View  02/15/2013   CLINICAL DATA:  Bradycardia  EXAM: PORTABLE CHEST - 1 VIEW  COMPARISON:  None.  FINDINGS: Cardiac shadow is mildly enlarged. A monitoring device is noted over the midline. The lungs are clear. No bony abnormality is noted.  IMPRESSION: No acute abnormality noted.   Electronically Signed   By: Inez Catalina M.D.   On: 02/15/2013 12:09    EKG Interpretation    Date/Time:  Monday February 15 2013 11:01:57 EST Ventricular Rate:  32 PR Interval:  290 QRS Duration: 98 QT Interval:  532 QTC Calculation: 388 R Axis:   31 Text Interpretation:  Mobitz II 2-degree AV block with 2:1 A-V conduction ST \\T \ T wave abnormality, consider inferolateral ischemia Abnormal ECG Since last tracing AV block is new Confirmed by SHELDON  MD, CHARLES ([id number]) on 02/15/2013 11:09:03 AM Also confirmed by Rogene Houston  MD, [place] ([id number])  on 02/15/2013 12:07:27 PM           Results for orders placed during  the hospital encounter of 02/15/13  COMPREHENSIVE METABOLIC PANEL      Result Value Range   Sodium 139  135 - 145 mEq/L   Potassium 5.2 (*) 3.5 - 5.1 mEq/L   Chloride 103  96 -  112 mEq/L   CO2 23  19 - 32 mEq/L   Glucose, Bld 98  70 - 99 mg/dL   BUN 86 (*) 6 - 23 mg/dL   Creatinine, Ser 2.84 (*) 0.50 - 1.10 mg/dL   Calcium 9.8  8.4 - 10.5 mg/dL   Total Protein 8.0  6.0 - 8.3 g/dL   Albumin 3.9  3.5 - 5.2 g/dL   AST 19  0 - 37 U/L   ALT 15  0 - 35 U/L   Alkaline Phosphatase 64  39 - 117 U/L   Total Bilirubin 0.2 (*) 0.3 - 1.2 mg/dL   GFR calc non Af Amer 16 (*) >90 mL/min   GFR calc Af Amer 19 (*) >90 mL/min  CBC WITH DIFFERENTIAL      Result Value Range   WBC 3.9 (*) 4.0 - 10.5 K/uL   RBC 3.85 (*) 3.87 - 5.11 MIL/uL   Hemoglobin 10.9 (*) 12.0 - 15.0 g/dL   HCT 33.5 (*) 36.0 - 46.0 %   MCV 87.0  78.0 - 100.0 fL   MCH 28.3  26.0 - 34.0 pg   MCHC 32.5  30.0 - 36.0 g/dL   RDW 14.6  11.5 - 15.5 %   Platelets 227  150 - 400 K/uL   Neutrophils Relative % 42 (*) 43 - 77 %   Neutro Abs 1.6 (*) 1.7 - 7.7 K/uL   Lymphocytes Relative 44  12 - 46 %   Lymphs Abs 1.8  0.7 - 4.0 K/uL   Monocytes Relative 11  3 - 12 %   Monocytes Absolute 0.4  0.1 - 1.0 K/uL   Eosinophils Relative 3  0 - 5 %   Eosinophils Absolute 0.1  0.0 - 0.7 K/uL   Basophils Relative 1  0 - 1 %   Basophils Absolute 0.0  0.0 - 0.1 K/uL  TROPONIN I      Result Value Range   Troponin I <0.30  <0.30 ng/mL  PRO B NATRIURETIC PEPTIDE      Result Value Range   Pro B Natriuretic peptide (BNP) 9842.0 (*) 0 - 125 pg/mL     MDM   1. Mobitz type 2 second degree AV block    Stable heart block looks like Mobitz type of tube. Discussed with outpatient clinics they wanted cardiology do the admission cardiology will be seeing the patient. Patient is remaining stable.  The patient is on a beta blocker patient states that they cut the amount down recently. Patient is followed by [facility] cardiology. Patient's chest x-ray without  pneumonia pneumothorax or pulmonary edema. Patient's labs remarkable for potassium of 5.2 creatinine of 2.84.  Patient will require admission and most likely pacemaker placement.  Mervin Kung, MD 02/15/13 [phone]

## 2013-02-16 DIAGNOSIS — I359 Nonrheumatic aortic valve disorder, unspecified: Secondary | ICD-10-CM

## 2013-02-16 DIAGNOSIS — I10 Essential (primary) hypertension: Secondary | ICD-10-CM

## 2013-02-16 LAB — BASIC METABOLIC PANEL
CO2: 19 mEq/L (ref 19–32)
CO2: 22 mEq/L (ref 19–32)
Calcium: 8.5 mg/dL (ref 8.4–10.5)
Calcium: 8.5 mg/dL (ref 8.4–10.5)
Chloride: 104 mEq/L (ref 96–112)
Chloride: 104 mEq/L (ref 96–112)
Creatinine, Ser: 2.7 mg/dL — ABNORMAL HIGH (ref 0.50–1.10)
GFR calc non Af Amer: 17 mL/min — ABNORMAL LOW (ref >90)
Glucose, Bld: 88 mg/dL (ref 70–99)
Glucose, Bld: 115 mg/dL — ABNORMAL HIGH (ref 70–99)
Sodium: 137 mEq/L (ref 137–147)

## 2013-02-16 LAB — TSH: TSH: 1.99 u[IU]/mL (ref 0.350–4.500)

## 2013-02-16 MED ORDER — SODIUM CHLORIDE 0.9 % IV SOLN: INTRAVENOUS | Status: DC

## 2013-02-16 MED ORDER — CEFAZOLIN SODIUM-DEXTROSE 2-3 GM-% IV SOLR
2.0000 g | INTRAVENOUS | Status: AC
  Filled 2013-02-16: qty 50

## 2013-02-16 MED ORDER — MUPIROCIN 2 % EX OINT
1.0000 "application " | TOPICAL_OINTMENT | Freq: Two times a day (BID) | CUTANEOUS | Status: DC
  Administered 2013-02-16 – 2013-02-18 (×5): 1 via NASAL
  Filled 2013-02-16: qty 22

## 2013-02-16 MED ORDER — ENOXAPARIN SODIUM 30 MG/0.3ML ~~LOC~~ SOLN
30.0000 mg | Freq: Every day | SUBCUTANEOUS | Status: DC
  Filled 2013-02-16: qty 0.3

## 2013-02-16 MED ORDER — CHLORHEXIDINE GLUCONATE 4 % EX LIQD
60.0000 mL | Freq: Once | CUTANEOUS | Status: AC
  Administered 2013-02-16: 4 via TOPICAL
  Filled 2013-02-16 (×2): qty 60

## 2013-02-16 MED ORDER — CHLORHEXIDINE GLUCONATE CLOTH 2 % EX PADS
6.0000 | MEDICATED_PAD | Freq: Every day | CUTANEOUS | Status: DC
  Administered 2013-02-16: 6 via TOPICAL

## 2013-02-16 MED ORDER — SODIUM CHLORIDE 0.9 % IR SOLN
80.0000 mg | Status: AC
  Filled 2013-02-16: qty 2

## 2013-02-16 MED ORDER — CHLORHEXIDINE GLUCONATE 4 % EX LIQD: 60.0000 mL | Freq: Once | CUTANEOUS | Status: DC

## 2013-02-16 NOTE — Progress Notes (Signed)
Echo Lab  2D Echocardiogram completed.  [place], RDCS 02/16/2013 9:28 AM

## 2013-02-16 NOTE — Care Management Note (Signed)
    Page 1 of 1   02/16/2013     8:42:26 AM   CARE MANAGEMENT NOTE 02/16/2013  Patient:  Sherry [name], Sherry [name]   Account Number:  [id number]  Date Initiated:  02/16/2013  Documentation initiated by:  Elissa Hefty  Subjective/Objective Assessment:   adm w hyperlipidemia,heart failure     Action/Plan:   lives w husband, pcp dr Charlann Lange   Anticipated DC Date:     Anticipated DC Plan:        DC Planning Services  CM consult      Choice offered to / List presented to:             Status of service:   Medicare Important Message given?   (If response is "NO", the following Medicare IM given date fields will be blank) Date Medicare IM given:   Date Additional Medicare IM given:    Discharge Disposition:    Per UR Regulation:  Reviewed for med. necessity/level of care/duration of stay  If discussed at [place] of Stay Meetings, dates discussed:    Comments:  12/30  0840 debbie Dayton Sherr rn,bsn will moniter pt for [facility] needs as pt progresses.

## 2013-02-16 NOTE — Progress Notes (Signed)
SUBJECTIVE: The patient is doing well today.  At this time, she denies chest pain, shortness of breath, or any new concerns.  Remains in 2:1 heart block.   CURRENT MEDICATIONS: . amLODipine  10 mg Oral Daily  . aspirin EC  81 mg Oral Daily  . enoxaparin (LOVENOX) injection  30 mg Subcutaneous QHS  . PHENobarbital  97.2 mg Oral QHS  . simvastatin  20 mg Oral q1800  . sodium chloride  3 mL Intravenous Q12H  . spironolactone  50 mg Oral Daily  . zonisamide  200 mg Oral QHS   . sodium chloride 75 mL/hr at 02/15/13 1228    OBJECTIVE: Physical Exam: Filed Vitals:   02/15/13 2200 02/16/13 0005 02/16/13 0407 02/16/13 0500  BP: 100/27     Pulse: 38     Temp:  98.5 F (36.9 C) 98.7 F (37.1 C)   TempSrc:  Oral Oral   Resp: 14     Height:      Weight:    207 lb 10.8 oz (94.2 kg)  SpO2: 100%       Intake/Output Summary (Last 24 hours) at 02/16/13 0615 Last data filed at 02/15/13 1803  Gross per 24 hour  Intake    515 ml  Output    600 ml  Net    -85 ml    Telemetry reveals 2:1 heart block with ventricular rates in the 30's  GEN- The patient is elderly appearing, alert and oriented x 3 today.   Head- normocephalic, atraumatic Eyes-  Sclera clear, conjunctiva pink Ears- hearing intact Oropharynx- clear Neck- supple,  Lungs- Clear to ausculation bilaterally, normal work of breathing Heart- bradycardic regular rhythm GI- soft, NT, ND, + BS Extremities- no clubbing, cyanosis, + edema Neuro- strength and sensation are intact  LABS: Basic Metabolic Panel:  Recent Labs  02/15/13 1125 02/15/13 1955  NA 139  --   K 5.2*  --   CL 103  --   CO2 23  --   GLUCOSE 98  --   BUN 86*  --   CREATININE 2.84* 2.84*  CALCIUM 9.8  --   MG  --  2.5   Liver Function Tests:  Recent Labs  02/15/13 1125 02/15/13 1955  AST 19  --   ALT 15  --   ALKPHOS 64  --   BILITOT 0.2*  --   PROT 8.0  --   ALBUMIN 3.9 3.2*   CBC:  Recent Labs  02/15/13 1125 02/15/13 1955  WBC  3.9* 3.6*  NEUTROABS 1.6*  --   HGB 10.9* 10.1*  HCT 33.5* 31.0*  MCV 87.0 86.8  PLT 227 207   Cardiac Enzymes:  Recent Labs  02/15/13 1125  TROPONINI <0.30   Thyroid Function Tests:  Recent Labs  02/15/13 1955  TSH 1.990    RADIOLOGY: Dg Chest Port 1 View 02/15/2013   CLINICAL DATA:  Bradycardia  EXAM: PORTABLE CHEST - 1 VIEW  COMPARISON:  None.  FINDINGS: Cardiac shadow is mildly enlarged. A monitoring device is noted over the midline. The lungs are clear. No bony abnormality is noted.  IMPRESSION: No acute abnormality noted.   Electronically Signed   By: Inez Catalina M.D.   On: 02/15/2013 12:09    ASSESSMENT AND PLAN:  Active Problems:   HYPERLIPIDEMIA   HYPERTENSION   SEIZURE DISORDER   LEG EDEMA, BILATERAL   Morbid obesity with BMI of 40.0-44.9, adult   CKD (chronic kidney disease) stage 3, GFR 30-59  ml/min   Bradycardia   Aortic stenosis, moderate   Chronic diastolic congestive heart failure   Anemia   Heart block AV second degree   1. 2:1 AV block Persists Will continue to let metoprolol washout.  IF her AV block persists tomorrow then she will require PPM.  Risks, benefits, alternatives to pacemaker implantation were discussed in detail with the patient today. The patient understands that the risks include but are not limited to bleeding, infection, pneumothorax, perforation, tamponade, vascular damage, renal failure, MI, stroke, death,  and lead dislodgement and wishes to proceed. We will therefore schedule the procedure for tomorrow.  2. Aortic stenosis Clinically stable Dr Burt Knack is following closely  3. HTN Improved  4. Acute on chronic renal failure Stable Hopefully will improve with PPM and increased HR

## 2013-02-17 ENCOUNTER — Encounter (HOSPITAL_COMMUNITY)
Admission: EM | Disposition: A | Discharge status: Home / Self Care | Payer: Self-pay | Source: Home / Self Care | Attending: Internal Medicine

## 2013-02-17 LAB — BASIC METABOLIC PANEL
BUN: 72 mg/dL — ABNORMAL HIGH (ref 6–23)
CO2: 20 mEq/L (ref 19–32)
Calcium: 8.5 mg/dL (ref 8.4–10.5)
Creatinine, Ser: 2.27 mg/dL — ABNORMAL HIGH (ref 0.50–1.10)
GFR calc non Af Amer: 21 mL/min — ABNORMAL LOW (ref >90)
Glucose, Bld: 94 mg/dL (ref 70–99)

## 2013-02-17 SURGERY — PERMANENT PACEMAKER INSERTION
Anesthesia: LOCAL

## 2013-02-17 MED ORDER — HYDRALAZINE HCL 25 MG PO TABS
25.0000 mg | ORAL_TABLET | Freq: Three times a day (TID) | ORAL | Status: DC
  Administered 2013-02-17 – 2013-02-18 (×4): 25 mg via ORAL
  Filled 2013-02-17 (×7): qty 1

## 2013-02-17 NOTE — Progress Notes (Signed)
SUBJECTIVE: The patient is doing well today.  At this time, she denies chest pain, shortness of breath, or any new concerns.  Remains in 2:1 heart block with intermittnet 3:2 Mobitz 1   Renal function stable.  Echo yesterday demonstrated an EF of 60-65%, severe AS, severe MAC of mitral valve,PA pressure 45, trivial pericardial effusion.   CURRENT MEDICATIONS: . amLODipine  10 mg Oral Daily  . aspirin EC  81 mg Oral Daily  .  ceFAZolin (ANCEF) IV  2 g Intravenous On Call  . chlorhexidine  60 mL Topical Once  . Chlorhexidine Gluconate Cloth  6 each Topical Q0600  . gentamicin irrigation  80 mg Irrigation On Call  . mupirocin ointment  1 application Nasal BID  . PHENobarbital  97.2 mg Oral QHS  . simvastatin  20 mg Oral q1800  . sodium chloride  3 mL Intravenous Q12H  . zonisamide  200 mg Oral QHS   . sodium chloride Stopped (02/16/13 0800)  . sodium chloride      OBJECTIVE: Physical Exam: Filed Vitals:   02/16/13 1603 02/16/13 1925 02/16/13 2259 02/17/13 0000  BP: 118/19 135/30 148/48   Pulse: 36 47 40 30  Temp: 98.2 F (36.8 C) 98.3 F (36.8 C) 98 F (36.7 C)   TempSrc: Oral Oral Oral   Resp: 14 16 17 12   Height:      Weight:      SpO2: 100% 100% 100% 100%    Intake/Output Summary (Last 24 hours) at 02/17/13 0602 Last data filed at 02/17/13 [id number]  Gross per 24 hour  Intake   1770 ml  Output   1150 ml  Net    620 ml    Telemetry reveals 2:1 heart block with ventricular rates in the 30's  GEN- The patient is elderly appearing, alert and oriented x 3 today.   Head- normocephalic, atraumatic Eyes-  Sclera clear, conjunctiva pink Ears- hearing intact Oropharynx- clear Neck- supple,  Lungs- Clear to ausculation bilaterally, normal work of breathing Heart- bradycardic regular rhythm 3/6 murmur GI- soft, NT, ND, + BS Extremities- no clubbing, cyanosis, + edema Neuro- strength and sensation are intact  LABS: Basic Metabolic Panel:  Recent Labs  02/15/13 1955  02/16/13 0443 02/16/13 1541  NA  --  137 138  K  --  5.6* 4.9  CL  --  104 104  CO2  --  19 22  GLUCOSE  --  88 115*  BUN  --  86* 79*  CREATININE 2.84* 2.69* 2.70*  CALCIUM  --  8.5 8.5  MG 2.5  --   --    Liver Function Tests:  Recent Labs  02/15/13 1125 02/15/13 1955  AST 19  --   ALT 15  --   ALKPHOS 64  --   BILITOT 0.2*  --   PROT 8.0  --   ALBUMIN 3.9 3.2*   CBC:  Recent Labs  02/15/13 1125 02/15/13 1955  WBC 3.9* 3.6*  NEUTROABS 1.6*  --   HGB 10.9* 10.1*  HCT 33.5* 31.0*  MCV 87.0 86.8  PLT 227 207   Cardiac Enzymes:  Recent Labs  02/15/13 1125  TROPONINI <0.30   Thyroid Function Tests:  Recent Labs  02/15/13 1955  TSH 1.990    RADIOLOGY: Dg Chest Port 1 View 02/15/2013   CLINICAL DATA:  Bradycardia  EXAM: PORTABLE CHEST - 1 VIEW  COMPARISON:  None.  FINDINGS: Cardiac shadow is mildly enlarged. A monitoring device is noted over  the midline. The lungs are clear. No bony abnormality is noted.  IMPRESSION: No acute abnormality noted.   Electronically Signed   By: Inez Catalina M.D.   On: 02/15/2013 12:09    ASSESSMENT AND PLAN:  Active Problems:   HYPERLIPIDEMIA   HYPERTENSION   SEIZURE DISORDER   LEG EDEMA, BILATERAL   Morbid obesity with BMI of 40.0-44.9, adult   CKD (chronic kidney disease) stage 3, GFR 30-59 ml/min   Bradycardia   Aortic stenosis, moderate   Chronic diastolic congestive heart failure   Anemia   Heart block AV second degree   1. 2:1 AV block with intermittent MBZ1  Suggesting a more benign physiology.  She was referred to hosp without symptoms  She has a narrow QRS  DrJA reminds me that she has severe underlying AS which may have impact on conduction system as well but for now without symptoms and with some improvements, and still on amlodipine which rarely is aggravating have spoken extensively with family and we will hold on pacing and watch The issue of renal dysfunction is concering as potential hemodynamic  consequence but with BP 160+ i doubt  2. Aortic stenosis Clinically stable Dr Burt Knack is following closely  3. HTN Improved  4. Acute on chronic renal failure  improved

## 2013-02-18 ENCOUNTER — Encounter (HOSPITAL_COMMUNITY): Payer: Self-pay | Admitting: Nurse Practitioner

## 2013-02-18 DIAGNOSIS — I350 Nonrheumatic aortic (valve) stenosis: Secondary | ICD-10-CM

## 2013-02-18 LAB — BASIC METABOLIC PANEL
BUN: 58 mg/dL — ABNORMAL HIGH (ref 6–23)
CO2: 18 mEq/L — ABNORMAL LOW (ref 19–32)
Calcium: 8.5 mg/dL (ref 8.4–10.5)
Chloride: 105 mEq/L (ref 96–112)
Creatinine, Ser: 1.91 mg/dL — ABNORMAL HIGH (ref 0.50–1.10)
GFR calc Af Amer: 30 mL/min — ABNORMAL LOW (ref >90)
GFR calc non Af Amer: 26 mL/min — ABNORMAL LOW (ref >90)
Glucose, Bld: 99 mg/dL (ref 70–99)
Potassium: 4.8 mEq/L (ref 3.7–5.3)
Sodium: 138 mEq/L (ref 137–147)

## 2013-02-18 MED ORDER — CHLORHEXIDINE GLUCONATE CLOTH 2 % EX PADS
6.0000 | MEDICATED_PAD | Freq: Every day | CUTANEOUS | Status: DC
Start: 2013-02-18 — End: 2013-02-18
  Administered 2013-02-18: 6 via TOPICAL

## 2013-02-18 MED ORDER — HYDRALAZINE HCL 50 MG PO TABS: 50.0000 mg | ORAL_TABLET | Freq: Three times a day (TID) | ORAL | Status: DC

## 2013-02-18 NOTE — Progress Notes (Signed)
Discharged home accompanied by husband,discharge instructions given to pt., belongings with spouse, pt is stable.

## 2013-02-18 NOTE — Discharge Summary (Signed)
Discharge Summary   Patient ID: Sherry [name],  MRN: [id number], DOB/AGE: September 25, 1945 68 y.o.  Admit date: 02/15/2013 Discharge date: 02/18/2013  Primary Care Provider: Nicoletta Dress, [place] Primary Cardiologist: Jerilynn Mages. Burt Knack, MD   Discharge Diagnoses Principal Problem:   Bradycardia and Mobitz I Heart Block  Active Problems:   HYPERTENSION   CKD (chronic kidney disease) stage 3, GFR 30-59 ml/min   Chronic diastolic congestive heart failure   Severe aortic stenosis   HYPERLIPIDEMIA   SEIZURE DISORDER   Morbid obesity with BMI of 40.0-44.9, adult   Anemia   Allergies Allergies  Allergen Reactions  . Ibuprofen Other (See Comments)    Dr advised pt not to take ibuprofen    Procedures  2D Echocardiogram 12.30.2014  Study Conclusions  - Left ventricle: The cavity size was mildly dilated. Wall   thickness was increased in a pattern of moderate LVH.   Systolic function was normal. The estimated ejection   fraction was in the range of 60% to 65%. - Aortic valve: There was severe stenosis. Valve area:   1.18cm^2(VTI). Valve area: 1.35cm^2 (Vmax). - Mitral valve: Severe MAC especially posteriorly - Left atrium: The atrium was mildly dilated. - Atrial septum: No defect or patent foramen ovale was   identified. - Pulmonary arteries: PA peak pressure: 41mm Hg (S). - Pericardium, extracardiac: A trivial pericardial effusion   was identified. _____________   History of Present Illness  68 y/o female with a h/o chronic diastolic CHF, fatigue, lower extremity edema, HTN, chronic kidney disease, and seizure disorder.  She presented to the [facility] office on 12/29 with complaints of dizziness and was found to be bradycardic with 2:1 heart block and rates in the high 20's.  She was immediately referred to the ER for proper evaluation.  There, she was hemodynamically stable but remained bradycardic.  Creatinine was elevated at 2.84.  She was admitted for further evaluation.  Hospital  Course  Upon admission, her previous home doses of amlodipine, lasix, metoprolol, lisinopril, and spironolactone were held secondary to both bradycardia and acute renal failure.  Troponin was normal.  She remained hemodynamically stable and she was noted on telemetry to continue to have 2:1 heart block with intermittent 3:2 Mobitz 1 heart block.  She was seen by electrophysiology and it was felt that in the setting of intermittent Mobitz 1 heart block that her heart block overall was of a more benign physiology.  Plans for pacemaker placement were put on hold and this morning she remains hemodynamically stable with stable rhythm and ongoing asymptomatic bradycardia.  She will be discharged today.  Her renal function has improved (creat now 1.91), however we will continue to hold ace inhibitor, lasix, and spironolactone therapy.  In the setting of holding antihypertensives and discontinuation of beta blocker and calcium channel blocker, we have added hydralazine therapy for management of her hypertension.  She will follow-up with Dr. Burt Knack as scheduled.  Discharge Vitals Blood pressure 153/24, pulse 37, temperature 98 F (36.7 C), temperature source Oral, resp. rate 14, height 5\' 1"  (1.549 m), weight 210 lb 1.6 oz (95.3 kg), SpO2 100.00%.  Filed Weights   02/16/13 0500 02/17/13 0500 02/18/13 0500  Weight: 207 lb 10.8 oz (94.2 kg) 208 lb 8.9 oz (94.6 kg) 210 lb 1.6 oz (95.3 kg)    Labs  CBC  Recent Labs  02/15/13 1955  WBC 3.6*  HGB 10.1*  HCT 31.0*  MCV 86.8  PLT [id number]   Basic Metabolic Panel  Recent Labs  02/15/13  1955  02/17/13 0445 02/18/13 0413  NA  --   < > 140 138  K  --   < > 5.0 4.8  CL  --   < > 106 105  CO2  --   < > 20 18*  GLUCOSE  --   < > 94 99  BUN  --   < > 72* 58*  CREATININE 2.84*  < > 2.27* 1.91*  CALCIUM  --   < > 8.5 8.5  MG 2.5  --   --   --   < > = values in this interval not displayed. Liver Function Tests  Recent Labs  02/15/13 1955  ALBUMIN 3.2*     Thyroid Function Tests  Recent Labs  02/15/13 1955  TSH 1.990   Disposition  Pt is being discharged home today in good condition.  Follow-up Plans & Appointments  Follow-up Information   Follow up with Sherren Mocha, MD On 03/29/2013. (3:00 PM)    Specialty:  Cardiology   Contact information:   [address] [place] [address] [phone]       Follow up with LI, NA, MD. (as scheduled.)    Specialty:  Internal Medicine   Contact information:   [place] [address] [phone]      Discharge Medications    Medication List    STOP taking these medications       amLODipine 10 MG tablet  Commonly known as:  NORVASC     furosemide 40 MG tablet  Commonly known as:  LASIX     lisinopril 40 MG tablet  Commonly known as:  PRINIVIL,ZESTRIL     spironolactone 50 MG tablet  Commonly known as:  ALDACTONE      TAKE these medications       aspirin 81 MG EC tablet  Take 81 mg by mouth daily.     CALCIUM 500 +D 500-400 MG-UNIT Tabs  Generic drug:  Calcium Carb-Cholecalciferol  Take 1 tablet by mouth 2 (two) times daily.     hydrALAZINE 50 MG tablet  Commonly known as:  APRESOLINE  Take 1 tablet (50 mg total) by mouth 3 (three) times daily.     OVER THE COUNTER MEDICATION  Place 1 application into the nose at bedtime. Nasal product     PHENobarbital 97.2 MG tablet  Commonly known as:  LUMINAL  Take 97.2 mg by mouth at bedtime.     pravastatin 40 MG tablet  Commonly known as:  PRAVACHOL  Take 40 mg by mouth daily.     zonisamide 100 MG capsule  Commonly known as:  ZONEGRAN  Take 200 mg by mouth at bedtime.       Outstanding Labs/Studies  None  Duration of Discharge Encounter   Greater than 30 minutes including physician time.  Signed, Murray Hodgkins NP 02/18/2013, 11:51 AM

## 2013-02-18 NOTE — Discharge Instructions (Signed)
***  PLEASE REMEMBER TO BRING ALL OF YOUR MEDICATIONS TO EACH OF YOUR FOLLOW-UP OFFICE VISITS.  

## 2013-02-18 NOTE — Progress Notes (Signed)
SUBJECTIVE: The patient is doing well today.  At this time, she denies chest pain, shortness of breath, or any new concerns.  Remains in 2:1 heart block with intermittnet 3:2 Mobitz 1   Renal function improved  Echo 12./14 demonstrated an EF of 60-65%, severe AS, severe MAC of mitral valve,PA pressure 45, trivial pericardial effusion.   CURRENT MEDICATIONS: . aspirin EC  81 mg Oral Daily  . chlorhexidine  60 mL Topical Once  . Chlorhexidine Gluconate Cloth  6 each Topical Q0600  . hydrALAZINE  25 mg Oral Q8H  . mupirocin ointment  1 application Nasal BID  . PHENobarbital  97.2 mg Oral QHS  . simvastatin  20 mg Oral q1800  . sodium chloride  3 mL Intravenous Q12H  . zonisamide  200 mg Oral QHS   . sodium chloride Stopped (02/16/13 0800)  . sodium chloride      OBJECTIVE: Physical Exam: Filed Vitals:   02/17/13 2315 02/18/13 0500 02/18/13 0545 02/18/13 0847  BP: 166/43  154/54 153/24  Pulse: 40   37  Temp: 98.1 F (36.7 C)  98.4 F (36.9 C) 98 F (36.7 C)  TempSrc: Oral  Oral Oral  Resp: 14  19 14   Height:      Weight:  210 lb 1.6 oz (95.3 kg)    SpO2: 100%  99% 100%    Intake/Output Summary (Last 24 hours) at 02/18/13 [id number] Last data filed at 02/18/13 0900  Gross per 24 hour  Intake    750 ml  Output    950 ml  Net   -200 ml    Telemetry reveals 2:1 heart block with ventricular rates in the 30's with intermittent 3:2 WCB 2 AVB  GEN- The patient is elderly appearing, alert and oriented x 3 today.   Head- normocephalic, atraumatic Eyes-  Sclera clear, conjunctiva pink Ears- hearing intact Oropharynx- clear Neck- supple,  Lungs- Clear to ausculation bilaterally, normal work of breathing Heart- bradycardic regular rhythm 3/6 murmur GI- soft, NT, ND, + BS Extremities- no clubbing, cyanosis, + edema Neuro- strength and sensation are intact  LABS: Basic Metabolic Panel:  Recent Labs  02/15/13 1955  02/17/13 0445 02/18/13 0413  NA  --   < > 140 138  K  --    < > 5.0 4.8  CL  --   < > 106 105  CO2  --   < > 20 18*  GLUCOSE  --   < > 94 99  BUN  --   < > 72* 58*  CREATININE 2.84*  < > 2.27* 1.91*  CALCIUM  --   < > 8.5 8.5  MG 2.5  --   --   --   < > = values in this interval not displayed. Liver Function Tests:  Recent Labs  02/15/13 1125 02/15/13 1955  AST 19  --   ALT 15  --   ALKPHOS 64  --   BILITOT 0.2*  --   PROT 8.0  --   ALBUMIN 3.9 3.2*   CBC:  Recent Labs  02/15/13 1125 02/15/13 1955  WBC 3.9* 3.6*  NEUTROABS 1.6*  --   HGB 10.9* 10.1*  HCT 33.5* 31.0*  MCV 87.0 86.8  PLT 227 207   Cardiac Enzymes:  Recent Labs  02/15/13 1125  TROPONINI <0.30   Thyroid Function Tests:  Recent Labs  02/15/13 1955  TSH 1.990    RADIOLOGY: Dg Chest Port 1 View 02/15/2013   CLINICAL DATA:  Bradycardia  EXAM: PORTABLE CHEST - 1 VIEW  COMPARISON:  None.  FINDINGS: Cardiac shadow is mildly enlarged. A monitoring device is noted over the midline. The lungs are clear. No bony abnormality is noted.  IMPRESSION: No acute abnormality noted.   Electronically Signed   By: Inez Catalina M.D.   On: 02/15/2013 12:09    ASSESSMENT AND PLAN:  Active Problems:   HYPERLIPIDEMIA   HYPERTENSION   SEIZURE DISORDER   LEG EDEMA, BILATERAL   Morbid obesity with BMI of 40.0-44.9, adult   CKD (chronic kidney disease) stage 3, GFR 30-59 ml/min   Bradycardia   Aortic stenosis, moderate   Chronic diastolic congestive heart failure   Anemia   Heart block AV second degree   1. 2:1 AV block with intermittent MBZ1  Suggesting a more benign physiology.  She was referred to hosp without symptoms   Her hemodynamics are stable with improving renal function   2. Aortic stenosis Clinically stable Dr Burt Knack is following closely  3. HTN Improved  4. Acute on chronic renal failure  improving    5. Bed bound-chronic  With asypmtomatic 2AVB1 esp bed bound no indication for pacing   Will continue no amlodipine and will use hydralazine for  BP Increase >50 q8h Has scheduled followup with dr Burt Knack

## 2013-02-19 ENCOUNTER — Telehealth: Payer: Self-pay | Admitting: Cardiovascular Disease

## 2013-02-19 ENCOUNTER — Telehealth: Payer: Self-pay | Admitting: *Deleted

## 2013-02-19 SURGERY — PERMANENT PACEMAKER INSERTION
Anesthesia: LOCAL

## 2013-02-19 NOTE — Telephone Encounter (Signed)
I spoke with Pamala Hurry the home health nurse and she was following the pt for venous stasis ulcer treatment per PCP. The pt is scheduled to be discharged from home health today and she wanted to know if the pt would need home health from a cardiology stand point.  I reviewed the pt's discharge summary and home health was not indicated.

## 2013-02-19 NOTE — Telephone Encounter (Signed)
I did make Pamala Hurry aware of the pt's post hospital follow-up appointments.

## 2013-02-19 NOTE — Telephone Encounter (Signed)
Call from [place] with [facility] - # [phone] Pt has been in hospital since Monday and d/c yesterday. S/p Mobitz type 2 heart block. They needs orders to resume Home health care.  Pt was being seen for venous status ulcer.  Nurse asked to see pt and evaluate wound.  Call back after visit for orders or d/c. Will this be okay?

## 2013-02-19 NOTE — Telephone Encounter (Signed)
New message     Home Health nurse says pt was seen in hospital for 2nd degree heart block do we need to see her for a f/u

## 2013-02-21 ENCOUNTER — Encounter: Payer: Self-pay | Admitting: Internal Medicine

## 2013-02-22 NOTE — Telephone Encounter (Signed)
Please resume orders for [facility] care and it is ok for [facility] visit for wound eval.   Thanks    Dr. Nicoletta Dress

## 2013-02-24 ENCOUNTER — Telehealth: Payer: Self-pay | Admitting: Internal Medicine

## 2013-02-24 NOTE — Telephone Encounter (Signed)
  INTERNAL MEDICINE RESIDENCY PROGRAM After-Hours Telephone Call    Reason for call:   I placed an outgoing call to [name] at 1200 noon regarding medication clarification.   Patient was recently admitted to the hospital by [facility] cardiology for the evaluation of heart block. Her home meds of Lasix, amlodipine, metoprolol, lisinopril, and Spirolactone were stopped upon discharge and Hydralazine was added to her regimen for BP control.   I have called her pharmacist and verbally clarified her meds as above.      Charlann Lange, MD   02/24/2013, 11:59 AM

## 2013-02-24 NOTE — Telephone Encounter (Signed)
Done

## 2013-03-02 ENCOUNTER — Telehealth: Payer: Self-pay | Admitting: *Deleted

## 2013-03-02 NOTE — Telephone Encounter (Signed)
Sure, Thanks   Dr. Nicoletta Dress

## 2013-03-02 NOTE — Telephone Encounter (Signed)
Cecille Rubin with [facility] [phone] called for PT orders for home care. Averbal order was given and will fax orders to [facility] for Dr Nicoletta Dress. Hilda Blades Azarel Banner RN 03/02/13 9AM

## 2013-03-05 ENCOUNTER — Other Ambulatory Visit: Payer: Self-pay | Admitting: Internal Medicine

## 2013-03-05 ENCOUNTER — Encounter: Payer: Self-pay | Admitting: Internal Medicine

## 2013-03-05 ENCOUNTER — Ambulatory Visit (HOSPITAL_COMMUNITY): Payer: Medicare Other

## 2013-03-05 ENCOUNTER — Ambulatory Visit (INDEPENDENT_AMBULATORY_CARE_PROVIDER_SITE_OTHER): Payer: Medicare Other | Admitting: Internal Medicine

## 2013-03-05 ENCOUNTER — Ambulatory Visit (HOSPITAL_COMMUNITY)
Admission: RE | Admit: 2013-03-05 | Discharge: 2013-03-05 | Disposition: A | Discharge status: Home / Self Care | Payer: Medicare Other | Source: Ambulatory Visit | Attending: Internal Medicine | Admitting: Internal Medicine

## 2013-03-05 ENCOUNTER — Inpatient Hospital Stay (HOSPITAL_COMMUNITY): Payer: Medicare Other

## 2013-03-05 ENCOUNTER — Telehealth: Payer: Self-pay | Admitting: *Deleted

## 2013-03-05 ENCOUNTER — Inpatient Hospital Stay (HOSPITAL_COMMUNITY)
Admission: AD | Admit: 2013-03-05 | Discharge: 2013-03-08 | DRG: 307 | Disposition: A | Discharge status: Home / Self Care | Payer: Medicare Other | Source: Ambulatory Visit | Attending: Internal Medicine | Admitting: Internal Medicine

## 2013-03-05 VITALS — BP 120/70 | HR 91 | Temp 97.2°F | Ht 61.0 in | Wt 204.8 lb

## 2013-03-05 DIAGNOSIS — R42 Dizziness and giddiness: Secondary | ICD-10-CM

## 2013-03-05 DIAGNOSIS — I440 Atrioventricular block, first degree: Secondary | ICD-10-CM

## 2013-03-05 DIAGNOSIS — N189 Chronic kidney disease, unspecified: Secondary | ICD-10-CM | POA: Diagnosis present

## 2013-03-05 DIAGNOSIS — E785 Hyperlipidemia, unspecified: Secondary | ICD-10-CM | POA: Diagnosis present

## 2013-03-05 DIAGNOSIS — I10 Essential (primary) hypertension: Secondary | ICD-10-CM | POA: Diagnosis present

## 2013-03-05 DIAGNOSIS — Z79899 Other long term (current) drug therapy: Secondary | ICD-10-CM

## 2013-03-05 DIAGNOSIS — Z7982 Long term (current) use of aspirin: Secondary | ICD-10-CM

## 2013-03-05 DIAGNOSIS — I359 Nonrheumatic aortic valve disorder, unspecified: Principal | ICD-10-CM | POA: Diagnosis present

## 2013-03-05 DIAGNOSIS — I498 Other specified cardiac arrhythmias: Secondary | ICD-10-CM | POA: Diagnosis present

## 2013-03-05 DIAGNOSIS — N184 Chronic kidney disease, stage 4 (severe): Secondary | ICD-10-CM | POA: Diagnosis present

## 2013-03-05 DIAGNOSIS — N183 Chronic kidney disease, stage 3 unspecified: Secondary | ICD-10-CM | POA: Diagnosis present

## 2013-03-05 DIAGNOSIS — I441 Atrioventricular block, second degree: Secondary | ICD-10-CM | POA: Diagnosis present

## 2013-03-05 DIAGNOSIS — I951 Orthostatic hypotension: Secondary | ICD-10-CM | POA: Diagnosis present

## 2013-03-05 DIAGNOSIS — G40909 Epilepsy, unspecified, not intractable, without status epilepticus: Secondary | ICD-10-CM | POA: Diagnosis present

## 2013-03-05 DIAGNOSIS — R55 Syncope and collapse: Secondary | ICD-10-CM | POA: Diagnosis not present

## 2013-03-05 DIAGNOSIS — I350 Nonrheumatic aortic (valve) stenosis: Secondary | ICD-10-CM | POA: Diagnosis present

## 2013-03-05 DIAGNOSIS — I509 Heart failure, unspecified: Secondary | ICD-10-CM | POA: Diagnosis present

## 2013-03-05 DIAGNOSIS — I5030 Unspecified diastolic (congestive) heart failure: Secondary | ICD-10-CM | POA: Diagnosis present

## 2013-03-05 DIAGNOSIS — Z6841 Body Mass Index (BMI) 40.0 and over, adult: Secondary | ICD-10-CM

## 2013-03-05 DIAGNOSIS — N179 Acute kidney failure, unspecified: Secondary | ICD-10-CM | POA: Diagnosis present

## 2013-03-05 DIAGNOSIS — I5032 Chronic diastolic (congestive) heart failure: Secondary | ICD-10-CM | POA: Diagnosis present

## 2013-03-05 DIAGNOSIS — R569 Unspecified convulsions: Secondary | ICD-10-CM

## 2013-03-05 DIAGNOSIS — Z87891 Personal history of nicotine dependence: Secondary | ICD-10-CM

## 2013-03-05 DIAGNOSIS — Z8673 Personal history of transient ischemic attack (TIA), and cerebral infarction without residual deficits: Secondary | ICD-10-CM

## 2013-03-05 DIAGNOSIS — A599 Trichomoniasis, unspecified: Secondary | ICD-10-CM | POA: Diagnosis present

## 2013-03-05 DIAGNOSIS — I129 Hypertensive chronic kidney disease with stage 1 through stage 4 chronic kidney disease, or unspecified chronic kidney disease: Secondary | ICD-10-CM | POA: Diagnosis present

## 2013-03-05 HISTORY — DX: Gastro-esophageal reflux disease without esophagitis: K21.9

## 2013-03-05 HISTORY — DX: Unspecified convulsions: R56.9

## 2013-03-05 HISTORY — DX: Unspecified osteoarthritis, unspecified site: M19.90

## 2013-03-05 HISTORY — DX: Atherosclerotic heart disease of native coronary artery without angina pectoris: I25.10

## 2013-03-05 HISTORY — DX: Headache: R51

## 2013-03-05 HISTORY — DX: Cardiac murmur, unspecified: R01.1

## 2013-03-05 HISTORY — DX: Transient cerebral ischemic attack, unspecified: G45.9

## 2013-03-05 LAB — COMPREHENSIVE METABOLIC PANEL
ALK PHOS: 73 U/L (ref 39–117)
ALT: 14 U/L (ref 0–35)
AST: 18 U/L (ref 0–37)
Albumin: 3.8 g/dL (ref 3.5–5.2)
BILIRUBIN TOTAL: 0.2 mg/dL — AB (ref 0.3–1.2)
BUN: 65 mg/dL — AB (ref 6–23)
CO2: 21 mEq/L (ref 19–32)
Calcium: 9.7 mg/dL (ref 8.4–10.5)
Chloride: 99 mEq/L (ref 96–112)
Creat: 2.34 mg/dL — ABNORMAL HIGH (ref 0.50–1.10)
Glucose, Bld: 111 mg/dL — ABNORMAL HIGH (ref 70–99)
Potassium: 4.4 mEq/L (ref 3.5–5.3)
Sodium: 137 mEq/L (ref 135–145)
Total Protein: 7.9 g/dL (ref 6.0–8.3)

## 2013-03-05 LAB — CBC WITH DIFFERENTIAL/PLATELET
BASOS PCT: 1 % (ref 0–1)
Basophils Absolute: 0.1 10*3/uL (ref 0.0–0.1)
Eosinophils Absolute: 0.1 10*3/uL (ref 0.0–0.7)
Eosinophils Relative: 1 % (ref 0–5)
HCT: 34.7 % — ABNORMAL LOW (ref 36.0–46.0)
Hemoglobin: 11.5 g/dL — ABNORMAL LOW (ref 12.0–15.0)
LYMPHS ABS: 1.4 10*3/uL (ref 0.7–4.0)
Lymphocytes Relative: 35 % (ref 12–46)
MCH: 28.3 pg (ref 26.0–34.0)
MCHC: 33.1 g/dL (ref 30.0–36.0)
MCV: 85.5 fL (ref 78.0–100.0)
MONO ABS: 0.3 10*3/uL (ref 0.1–1.0)
Monocytes Relative: 7 % (ref 3–12)
Neutro Abs: 2.3 10*3/uL (ref 1.7–7.7)
Neutrophils Relative %: 56 % (ref 43–77)
Platelets: 281 10*3/uL (ref 150–400)
RBC: 4.06 MIL/uL (ref 3.87–5.11)
RDW: 14.9 % (ref 11.5–15.5)
WBC: 4 10*3/uL (ref 4.0–10.5)

## 2013-03-05 LAB — CK TOTAL AND CKMB (NOT AT ARMC)
CK, MB: 2.7 ng/mL (ref 0.3–4.0)
Relative Index: INVALID (ref 0.0–2.5)
Total CK: 69 U/L (ref 7–177)

## 2013-03-05 LAB — TROPONIN I
Troponin I: <0.3 ng/mL (ref <0.30)
Troponin I: 0.31 ng/mL (ref <0.30)

## 2013-03-05 LAB — LIPID PANEL
Cholesterol: 205 mg/dL — ABNORMAL HIGH (ref 0–200)
HDL: 67 mg/dL (ref >39)
LDL CALC: 121 mg/dL — AB (ref 0–99)
TRIGLYCERIDES: 87 mg/dL (ref <150)
Total CHOL/HDL Ratio: 3.1 RATIO
VLDL: 17 mg/dL (ref 0–40)

## 2013-03-05 LAB — PHENOBARBITAL LEVEL: Phenobarbital: 24.3 ug/mL (ref 15.0–40.0)

## 2013-03-05 MED ORDER — CALCIUM CARBONATE-VITAMIN D 500-200 MG-UNIT PO TABS
1.0000 | ORAL_TABLET | Freq: Two times a day (BID) | ORAL | Status: DC
  Administered 2013-03-05 – 2013-03-08 (×6): 1 via ORAL
  Filled 2013-03-05 (×7): qty 1

## 2013-03-05 MED ORDER — SIMVASTATIN 20 MG PO TABS
20.0000 mg | ORAL_TABLET | Freq: Every day | ORAL | Status: DC
  Administered 2013-03-05 – 2013-03-07 (×3): 20 mg via ORAL
  Filled 2013-03-05 (×5): qty 1

## 2013-03-05 MED ORDER — PHENOBARBITAL 97.2 MG PO TABS: 97.2000 mg | ORAL_TABLET | Freq: Every day | ORAL | Status: DC

## 2013-03-05 MED ORDER — ZONISAMIDE 100 MG PO CAPS
200.0000 mg | ORAL_CAPSULE | Freq: Every day | ORAL | Status: DC
  Administered 2013-03-05 – 2013-03-07 (×3): 200 mg via ORAL
  Filled 2013-03-05 (×4): qty 2

## 2013-03-05 MED ORDER — HEPARIN SODIUM (PORCINE) 5000 UNIT/ML IJ SOLN
5000.0000 [IU] | Freq: Three times a day (TID) | INTRAMUSCULAR | Status: DC
  Administered 2013-03-05 – 2013-03-08 (×9): 5000 [IU] via SUBCUTANEOUS
  Filled 2013-03-05 (×11): qty 1

## 2013-03-05 MED ORDER — ONDANSETRON HCL 4 MG PO TABS: 4.0000 mg | ORAL_TABLET | Freq: Four times a day (QID) | ORAL | Status: DC | PRN

## 2013-03-05 MED ORDER — SODIUM CHLORIDE 0.9 % IJ SOLN
3.0000 mL | Freq: Two times a day (BID) | INTRAMUSCULAR | Status: DC
  Administered 2013-03-05: 3 mL via INTRAVENOUS

## 2013-03-05 MED ORDER — PHENOBARBITAL 60 MG PO TABS: 60.0000 mg | ORAL_TABLET | Freq: Every day | ORAL | Status: DC

## 2013-03-05 MED ORDER — ASPIRIN EC 81 MG PO TBEC
81.0000 mg | DELAYED_RELEASE_TABLET | Freq: Every day | ORAL | Status: DC
  Filled 2013-03-05: qty 1

## 2013-03-05 MED ORDER — ASPIRIN 325 MG PO TABS
325.0000 mg | ORAL_TABLET | Freq: Once | ORAL | Status: AC
  Administered 2013-03-05: 325 mg via ORAL
  Filled 2013-03-05 (×2): qty 1

## 2013-03-05 MED ORDER — ACETAMINOPHEN 650 MG RE SUPP: 650.0000 mg | Freq: Four times a day (QID) | RECTAL | Status: DC | PRN

## 2013-03-05 MED ORDER — ONDANSETRON HCL 4 MG/2ML IJ SOLN: 4.0000 mg | Freq: Four times a day (QID) | INTRAMUSCULAR | Status: DC | PRN

## 2013-03-05 MED ORDER — PHENOBARBITAL 32.4 MG PO TABS
162.0000 mg | ORAL_TABLET | Freq: Every day | ORAL | Status: DC
  Administered 2013-03-05 – 2013-03-07 (×3): 162 mg via ORAL
  Filled 2013-03-05 (×3): qty 5

## 2013-03-05 MED ORDER — PHENOBARBITAL 97.2 MG PO TABS: 157.2000 mg | ORAL_TABLET | Freq: Every day | ORAL | Status: DC

## 2013-03-05 MED ORDER — ASPIRIN EC 81 MG PO TBEC
81.0000 mg | DELAYED_RELEASE_TABLET | Freq: Every day | ORAL | Status: DC
  Administered 2013-03-06 – 2013-03-08 (×3): 81 mg via ORAL
  Filled 2013-03-05 (×3): qty 1

## 2013-03-05 MED ORDER — PHENOBARBITAL 97.2 MG PO TABS: 162.0000 mg | ORAL_TABLET | Freq: Every day | ORAL | Status: DC

## 2013-03-05 MED ORDER — CALCIUM CARB-CHOLECALCIFEROL 500-400 MG-UNIT PO TABS: 1.0000 | ORAL_TABLET | Freq: Two times a day (BID) | ORAL | Status: DC

## 2013-03-05 MED ORDER — ACETAMINOPHEN 325 MG PO TABS
650.0000 mg | ORAL_TABLET | Freq: Four times a day (QID) | ORAL | Status: DC | PRN
  Administered 2013-03-07: 650 mg via ORAL
  Filled 2013-03-05: qty 2

## 2013-03-05 NOTE — Consult Note (Addendum)
CARDIOLOGY CONSULT NOTE  Patient ID: Sherry [name] MRN: [id number] DOB/AGE: 06-05-1945 68 y.o.  Admit date: 03/05/2013 Primary Physician Charlann Lange, MD Primary Cardiologist Dr. Burt Knack Chief Complaint  Presyncope  HPI:  Who was recently admitted with 2:1 HB.  However, this improved after holding meds including her beta blocker and amlodipine.   She does have a history of severe AS.  She has multiple other medical problems as listed below.  She presented today with symptoms of dizziness and near syncope.  She says that the event today also happened on Monday.  She reports that she was sitting in taking her sponge bath. She felt like the room was moving. She couldn't focus. She felt like she was not able to see. She was nauseated. She had propped against the sink. She did not lose consciousness.  Her husband the other day did witness some left arm shaking apparently. He put cold packs on her forehead and her neck and this seems to stop the events. She said it felt like her previous seizures. She had otherwise been doing relatively well though she is very limited because of arthritis. She gets around a little bit with a walker. She did have a low normal phenobarbital level. She does have first-degree heart block on EKG but is not evidence of higher degree heart block. However, was felt that this could possibly be a cardiac etiology.   Past Medical History  Diagnosis Date  . Aortic stenosis     a. 06/2012 ECHO: severly calcified AV, with with mod AS by Doppler criteria. Sig LVH;  b. 01/2013 Echo: EF 60-65%, mod LVH, Sev AS (1.18cm^2 VTI, 1.35cm^2 Vmax), Severe MAC esp posteriorly, mildly dil LA, PASP 31mmHg.  [name] Hypertension   . Seizure disorder   . Hyperlipidemia   . Chronic diastolic heart failure     a. 06/2012 ECHO: EF [id number], grd 1 distolic dysfxn, severly calcified AV, with with mod AS by Doppler criteria. Sig LVH  . CKD (chronic kidney disease)     a. baseline CKD stage III  . Morbid  obesity   . Anemia   . Bilateral lower extremity edema   . Mobitz (type) I (Wenckebach's) atrioventricular block     a. 01/2013 - asymptomatic.  . Bradycardia     a. 01/2013 - asymptomatic.    No past surgical history on file.  Allergies  Allergen Reactions  . Ibuprofen Other (See Comments)    Dr advised pt not to take ibuprofen   Prescriptions prior to admission  Medication Sig Dispense Refill  . amLODipine (NORVASC) 10 MG tablet Take 10 mg by mouth daily.      [name] aspirin 81 MG EC tablet Take 81 mg by mouth daily.       . Calcium Carb-Cholecalciferol (CALCIUM 500 +D) 500-400 MG-UNIT TABS Take 1 tablet by mouth 2 (two) times daily.      . furosemide (LASIX) 40 MG tablet Take 40-80 mg by mouth 2 (two) times daily. Take 80mg  in the morning and 40mg  at bedtime.      . hydrALAZINE (APRESOLINE) 50 MG tablet Take 1 tablet (50 mg total) by mouth 3 (three) times daily.  90 tablet  6  . lisinopril (PRINIVIL,ZESTRIL) 40 MG tablet Take 40 mg by mouth daily.      [name] OVER THE COUNTER MEDICATION Place 1 application into the nose at bedtime. Nasal product      . PHENobarbital (LUMINAL) 97.2 MG tablet Take 97.2 mg by mouth at bedtime.      [name]  pravastatin (PRAVACHOL) 40 MG tablet Take 40 mg by mouth daily.      [name] spironolactone (ALDACTONE) 50 MG tablet Take 50 mg by mouth daily.      . Vitamin D, Ergocalciferol, (DRISDOL) 50000 UNITS CAPS capsule Take 50,000 Units by mouth every 7 (seven) days. On Friday      . zonisamide (ZONEGRAN) 100 MG capsule Take 200 mg by mouth at bedtime.       No family history on file.  History   Social History  . Marital Status: Married    Spouse Name: N/A    Number of Children: N/A  . Years of Education: N/A   Occupational History  . Not on file.   Social History Main Topics  . Smoking status: Former Smoker    Quit date: 06/03/2000  . Smokeless tobacco: Not on file  . Alcohol Use: No  . Drug Use: No  . Sexual Activity: Not on file   Other Topics Concern  .  Not on file   Social History Narrative  . No narrative on file     ROS:  Limited by arthritis.  As stated in the HPI and negative for all other systems.  Physical Exam: Blood pressure 152/41, pulse 115, temperature 98.6 F (37 C), temperature source Oral, resp. rate 18, height 5\' 1"  (1.549 m), weight 203 lb 4.2 oz (92.2 kg), SpO2 99.00%.  GENERAL:  Well appearing HEENT:  Pupils equal round and reactive, fundi not visualized, oral mucosa unremarkable NECK:  No jugular venous distention, waveform within normal limits, carotid upstroke brisk and symmetric, no bruits, no thyromegaly LYMPHATICS:  No cervical, inguinal adenopathy LUNGS:  Clear to auscultation bilaterally BACK:  No CVA tenderness CHEST:  Unremarkable HEART:  PMI not displaced or sustained,S1 and S2 within normal limits, no S3, no S4, no clicks, no rubs, 3/6 mid to late peaking systolic murmur radiating out the aortic outflow tract, no diastolic murmurs ABD:  Flat, positive bowel sounds normal in frequency in pitch, no bruits, no rebound, no guarding, no midline pulsatile mass, no hepatomegaly, no splenomegaly EXT:  2 plus pulses upper, absent dorsalis pedis and posterior tibialis bilaterally, mild right greater than left lower extremity edema, chronic venous stasis changes on the right, severe muscle wasting, severe arthritic changes of the knees no cyanosis no clubbing SKIN:  No rashes no nodules NEURO:  Cranial nerves II through XII grossly intact, motor grossly intact throughout PSYCH:  Cognitively intact, oriented to person place and time   Labs: Lab Results  Component Value Date   BUN 65* 03/05/2013   Lab Results  Component Value Date   CREATININE 2.34* 03/05/2013   Lab Results  Component Value Date   NA 137 03/05/2013   K 4.4 03/05/2013   CL 99 03/05/2013   CO2 21 03/05/2013   Lab Results  Component Value Date   TROPONINI <0.30 03/05/2013   Lab Results  Component Value Date   WBC 4.0 03/05/2013   HGB 11.5*  03/05/2013   HCT 34.7* 03/05/2013   MCV 85.5 03/05/2013   PLT 281 03/05/2013    Lab Results  Component Value Date   ALT 14 03/05/2013   AST 18 03/05/2013   ALKPHOS 73 03/05/2013   BILITOT 0.2* 03/05/2013     EKG:  Sinus rhythm, rate 74, first degree AV block, lateral T-wave inversions unchanged from previous, no acute ST-T wave changes.   ASSESSMENT AND PLAN:   PRESYNCOPE:  The patient did not have a true syncopal  episode. She thinks her events were similar to previous seizures. There is no weight dispute this in the clear cardiac etiology. She did have heart block in the past although this was related to her medications and acute on chronic renal insufficiency. She is no longer on beta blockers and Norvasc. There is no evidence of a higher degree heart block and first-degree. At this point she can continue to be monitored while in hospital and probably sent home monitor. There is no indication for pacing at this time. It is possible his symptoms are related to aortic stenosis which are addressed below.  AS:  She does have  severe aortic stenosis by echocardiogram.. This seems to have progressed somewhat from an echo earlier this year to the one she had last month. However, she was felt by Dr. Burt Knack to be a very poor candidate for any valve procedure and in particular would not be an open valve replacement candidate. She can be followed as an outpatient for further consideration of other therapies such as TAVR.    ACUTE ON CHRONIC CKD:  She was taken off of spironolactone, lisinopril and amlodipine at the last visit.  Her creatinine can be followed by primary team. This is however not far from previous readings.  HTN:  Her blood pressure was normal in the clinic. Seems to be a little bit elevated this evening. She'll likely have to have a hydralazine restarted. He has not been ordered initially as far as I can tell.   SignedMinus Breeding 03/05/2013, 7:32 PM

## 2013-03-05 NOTE — H&P (Signed)
Date: 03/05/2013               Patient Name:  Sherry [name] MRN: [id number]  DOB: 25-Aug-1945 Age / Sex: 68 y.o., female   PCP: Charlann Lange, MD         Medical Service: Internal Medicine Teaching Service         Attending Physician: Dr. Axel Filler, MD    First Contact: Dr. Mechele Claude Pager: [phone]  Second Contact: Dr. Eula Fried Pager: [phone]       After Hours (After 5p/  First Contact Pager: [phone]  weekends / holidays): Second Contact Pager: [phone]   Chief Complaint: near syncope  History of Present Illness:  SherryPari [name] is a 68 y.o. woman with h/o seizure, HTN, HLD, CKD stage 3, CVA and severe aortic stenosis (per Echo 01/2013) who presents w/ c/o near syncope.   Patient reports she was taking a bath at 8AM this morning when after a few minutes of being in the tub she had a feeling of nausea, indigestion/burning in her chest, SOB, some belching, and room spinning dizziness that lasted about 25 minutes in total. Patient also notes that she had no LOC, though feels as though her memory of the event is somewhat foggy. She had some shaking of her L arm during this episode as well that lasted only about 5 minutes (witnessed by husband). Patient also feels as though for a few minutes she was seeing "double" during this episode. She had an identical episode of this while bathing on Monday of this week that lasted about 1 hour. In between episodes she has felt completely herself, no DOE, no chest pain, diaphoresis, vomiting, dysuria, F/C. She denies falling or LOC during these episodes, though she did feel like she slumped over.   Patient is followed at [facility] neurology for her seizures. Per chart review, patient was seen last in Oct 2014 at which time her phenobarb level was elevated at 41 and patient had been drowsy in clinic. Neurologist called patient and daughter to let them know to decrease the phenobarb dosing from 180mg  daily to 97.5mg  + 60mg  daily. However, patient never got  this message and had only been taking 97.5mg  along with the prescribed zonisamide 200mg  qHS.  Additionally, patient was recently hospitalized on the Cardiology service (12/29-1/1) for dizziness, found to have bradycardia to high 20s w/ 2:1 heart block w/ intermittent 3:2 mobitz type 1 AV block. During this admission, patient's symptoms resolved, so pace maker was not placed. They discontinued her metoprolol, lasix, lisinopril, spironolactone, amlodipine. However, patient has been taking all of these medications except for the metoprolol. Upon discharge they added hydralazine 50mg  TID for HTN control.   Pt was admitted from clinic and found to have phenobarbital level of 24 (low-normal). Troponin negative x 1. CMP significant for glucose 111, BUN 65 and Cr 2.34 (baseline Cr 1.4-1.6). EKG showed NSR long PR w/ nonspecific T wave changes.   Patient does have home health PT that works with her. She lives at home with her husband.  Meds: Medications Prior to Admission  Medication Sig Dispense Refill  . amLODipine (NORVASC) 10 MG tablet Take 10 mg by mouth daily.      [name] aspirin 81 MG EC tablet Take 81 mg by mouth daily.       . Calcium Carb-Cholecalciferol (CALCIUM 500 +D) 500-400 MG-UNIT TABS Take 1 tablet by mouth 2 (two) times daily.      . furosemide (LASIX) 40 MG tablet Take 40-80  mg by mouth 2 (two) times daily. Take 80mg  in the morning and 40mg  at bedtime.      . hydrALAZINE (APRESOLINE) 50 MG tablet Take 1 tablet (50 mg total) by mouth 3 (three) times daily.  90 tablet  6  . lisinopril (PRINIVIL,ZESTRIL) 40 MG tablet Take 40 mg by mouth daily.      [name] OVER THE COUNTER MEDICATION Place 1 application into the nose at bedtime. Nasal product      . PHENobarbital (LUMINAL) 97.2 MG tablet Take 97.2 mg by mouth at bedtime.      . pravastatin (PRAVACHOL) 40 MG tablet Take 40 mg by mouth daily.      [name] spironolactone (ALDACTONE) 50 MG tablet Take 50 mg by mouth daily.      . Vitamin D, Ergocalciferol,  (DRISDOL) 50000 UNITS CAPS capsule Take 50,000 Units by mouth every 7 (seven) days. On Friday      . zonisamide (ZONEGRAN) 100 MG capsule Take 200 mg by mouth at bedtime.        Allergies: Allergies as of 03/05/2013 - Review Complete 03/05/2013  Allergen Reaction Noted  . Ibuprofen Other (See Comments) 10/30/2012   Past Medical History  Diagnosis Date  . Aortic stenosis     a. 06/2012 ECHO: severly calcified AV, with with mod AS by Doppler criteria. Sig LVH;  b. 01/2013 Echo: EF 60-65%, mod LVH, Sev AS (1.18cm^2 VTI, 1.35cm^2 Vmax), Severe MAC esp posteriorly, mildly dil LA, PASP 53mmHg.  [name] Hypertension   . Seizure disorder   . Hyperlipidemia   . Chronic diastolic heart failure     a. 06/2012 ECHO: EF Q000111Q, grd 1 distolic dysfxn, severly calcified AV, with with mod AS by Doppler criteria. Sig LVH  . CKD (chronic kidney disease)     a. baseline CKD stage III  . Morbid obesity   . Anemia   . Bilateral lower extremity edema   . Mobitz (type) I (Wenckebach's) atrioventricular block     a. 01/2013 - asymptomatic.  . Bradycardia     a. 01/2013 - asymptomatic.   No past surgical history on file. No family history on file. History   Social History  . Marital Status: Married    Spouse Name: N/A    Number of Children: N/A  . Years of Education: N/A   Occupational History  . Not on file.   Social History Main Topics  . Smoking status: Former Smoker    Quit date: 06/03/2000  . Smokeless tobacco: Not on file  . Alcohol Use: No  . Drug Use: No  . Sexual Activity: Not on file   Other Topics Concern  . Not on file   Social History Narrative  . No narrative on file    Review of Systems: General: no fevers, chills, changes in weight, changes in appetite Skin: no rash HEENT: see HPI Pulm: see HPI CV: see HPI Abd: see HPI GU: no dysuria, hematuria, polyuria Ext: no arthralgias, myalgias Neuro: no weakness, numbness, or tingling  Physical Exam: Blood pressure 152/41,  pulse 115, temperature 98.6 F (37 C), temperature source Oral, resp. rate 18, height 5\' 1"  (1.549 m), weight 203 lb 4.2 oz (92.2 kg), SpO2 99.00%. Weight is down from 10/2012 which was 230lbs  General: alert, cooperative, sitting upright in chair and in no apparent distress HEENT: pupils equal round and reactive to light, vision grossly intact, oropharynx clear and non-erythematous, MMM Neck: supple Lungs: clear to ascultation bilaterally, normal work of respiration, no wheezes,  rales, ronchi Heart: regular rate and rhythm, harsh 3/6 systolic murmur Abdomen: soft, non-tender, non-distended, normal bowel sounds Extremities: warm extremities, compression stockings in place, trace BLE edema bilaterally  Neurologic: alert & oriented X3, cranial nerves II-XII intact, strength 5/5 throughout, sensation intact to light touch, cerebellar function intact  Lab results: Basic Metabolic Panel:  Recent Labs  03/05/13 1450  NA 137  K 4.4  CL 99  CO2 21  GLUCOSE 111*  BUN 65*  CREATININE 2.34*  CALCIUM 9.7   Liver Function Tests:  Recent Labs  03/05/13 1450  AST 18  ALT 14  ALKPHOS 73  BILITOT 0.2*  PROT 7.9  ALBUMIN 3.8   CBC:  Recent Labs  03/05/13 1450  WBC 4.0  NEUTROABS 2.3  HGB 11.5*  HCT 34.7*  MCV 85.5  PLT 281   Cardiac Enzymes:  Recent Labs  03/05/13 1450  TROPONINI <0.30   Imaging results:  No results found.  Other results: EKG: NSR w/ prolonged PR interval, normal axis, some nonspecific T wave changes that appear to be stable.   Assessment & Plan by Problem:  # Near syncope: Patient presenting with symptoms suspicious for presyncope. She has known severe AS as per Echo 01/2013, which may be the cause of her symptoms. Patient also has h/o seizures and during the episodes she described having some shaking to L arm, though this was less "violent" than her usual seizure. She does admit that these episodes might be similar to other seizures. Additionally, she  was not taking as much phenobarbitol as her neurologist had recommended w/ a low-normal phenobarb level today. BPPV vs other etiologies of vertigo should also be considered given the "room spinning" dizziness, though this is less likely as her primary symptom is not dizziness. ACS should also be considered given her RFs  HTN, HLD, obesity. TIMI score is 2, 8% risk. -EKG in AM -CXR -BMP, lipid panel -cycle troponins -ASA 325mg , the ASA 81mg  daily after that -continue seizure medications as per below -continue statin -mointor on tele -neuro checks q4h -seizure precautions -orthostatic vital signs -cardiology consult - HOLD-amlodipine, lasix, lisinopril, spironolactone  # Seizure disorder: Unclear if these episodes represent seizures. They are not patient's typical seizures, though the aura sensation of nausea is typical of her seizures. She did have some shaking on L side during both of these episodes and she is not taking her medications as prescribed with a low-normal phenobarb level upon admission. -continue phenobarbital 97.2mg  + 60mg  qHS and zonisamide 200mg  qHS -seizure precautions and neuro checks as above -bed rest  # HTN: BP 152/41 on admission. Will hold antihypertensives given low diastolic pressure. Will continue to monitor. - holding home hydralazine  # 1st degree AV block: Patient has h/o 1st degree AV block with intermittent Mobitz type 1 AV block with significant bradycardia and recent hospital admission for these issues. Pacer was not placed. This episode was thought to be 2/2 medications and A/CKD. The metoprolol, lisinopril, amlodipine, spironolactone and lasix were stopped at her prior admission, though patient continues to take all except for the metoprolol. -hold all home meds listed above -repeat EKG in AM  # Acute on CKD stage 3: Cr 2.34 on admission. Baseline Cr approx 1.4-1.6 (1.46 10/2012, though 2.6-2.8 since 12/29)  # HLD: Continue statin. Checking lipid  panel.  # VTE: heparin  # Diet: HH  Code status: full  Dispo: Disposition is deferred at this time, awaiting improvement of current medical problems. Anticipated discharge in approximately 1-2  day(s).   The patient does have a current PCP (Charlann Lange, MD) and does need an [facility] hospital follow-up appointment after discharge.  The patient does not have transportation limitations that hinder transportation to clinic appointments.  Signed: Rebecca Eaton, MD 03/05/2013, 7:10 PM

## 2013-03-05 NOTE — Telephone Encounter (Signed)
Talked with pt since PT was with pt now - states had 2 seizures this week in AM. Still cont with med for seizures - last seizure was years ago. Appt made 03/05/13 1:45PM Dr Burnard Bunting.  Problems with transportation. Hilda Blades Niralya Ohanian RN 03/05/13 10:15AM

## 2013-03-05 NOTE — Progress Notes (Signed)
Subjective:   Patient ID: Sherry [name] female   DOB: [birth year] 68 y.o.   MRN: [id number]  Chief Complaint  Patient presents with  . Seizures    HPI: [name] is a 68 y.o. woman with h/o seizure d/c, HTN, HLD, CKD3, cervical disc disease, CVA and aortic stenosis who presents for seizure follow up.  Triage spoke with patient earlier today, patient had 67 seizures earlier this week and one today, last seizure was years ago.   Today, patient was taking a bath, and started to feel extreme indigestion, seeing double, and feeling dizzy all of a sudden.  Now feels almost back to normal, but sensation of indigestion has lingered.  Similar episode for the last 2 mornings while bathing. Was SOB this morning, but now resolved. No diaphoresis. No chest pain.  Has not actually lost consciousness.  Patient is followed by Dr. Mallie Darting (neuro at [facility], most recent visit 11/30/12) --> Per care everywhere AED meds/dosages are zonisamide 200mg  qhs & Phenobarbital 97.2+60 mg qHS. She has follow up with them on 06/03/13.  She has been taking zonisamide 200, but only 97.2mg  of phenobarbital.    She was hospitalized 12/29-1/1 with complaints of dizziness and found to be bradycardic with 2:1 heart block with rates in the high 20s. At hospital discharge, ACEI, lasix, spironolactone, amlodipine and BB were all held, but she has been taking this medication still.    Review of Systems: Constitutional: Denies fever, chills, diaphoresis, appetite change and fatigue.  HEENT: Denies photophobia, eye pain, redness, hearing loss, ear pain, congestion, sore throat, rhinorrhea, sneezing, mouth sores, trouble swallowing, neck pain, neck stiffness and tinnitus.  Respiratory: Denies cough, chest tightness, and wheezing.  Cardiovascular: Denies chest pain, palpitations and leg swelling.  Gastrointestinal: Denies vomiting, abdominal pain, diarrhea,blood in stool and abdominal distention.  Genitourinary: Denies  dysuria, urgency, frequency, hematuria, flank pain and difficulty urinating.  Musculoskeletal: Denies myalgias, back pain, joint swelling, arthralgias and gait problem.  Skin: Denies pallor, rash and wound.  Neurological: per HPI  Past Medical History  Diagnosis Date  . Aortic stenosis     a. 06/2012 ECHO: severly calcified AV, with with mod AS by Doppler criteria. Sig LVH;  b. 01/2013 Echo: EF 60-65%, mod LVH, Sev AS (1.18cm^2 VTI, 1.35cm^2 Vmax), Severe MAC esp posteriorly, mildly dil LA, PASP 39mmHg.  [name] Hypertension   . Seizure disorder   . Hyperlipidemia   . Chronic diastolic heart failure     a. 06/2012 ECHO: EF Q000111Q, grd 1 distolic dysfxn, severly calcified AV, with with mod AS by Doppler criteria. Sig LVH  . CKD (chronic kidney disease)     a. baseline CKD stage III  . Morbid obesity   . Anemia   . Bilateral lower extremity edema   . Mobitz (type) I (Wenckebach's) atrioventricular block     a. 01/2013 - asymptomatic.  . Bradycardia     a. 01/2013 - asymptomatic.   Current Outpatient Prescriptions  Medication Sig Dispense Refill  . aspirin 81 MG EC tablet Take 81 mg by mouth daily.       . Calcium Carb-Cholecalciferol (CALCIUM 500 +D) 500-400 MG-UNIT TABS Take 1 tablet by mouth 2 (two) times daily.      . hydrALAZINE (APRESOLINE) 50 MG tablet Take 1 tablet (50 mg total) by mouth 3 (three) times daily.  90 tablet  6  . OVER THE COUNTER MEDICATION Place 1 application into the nose at bedtime. Nasal product      .  PHENobarbital (LUMINAL) 97.2 MG tablet Take 97.2 mg by mouth at bedtime.      . pravastatin (PRAVACHOL) 40 MG tablet Take 40 mg by mouth daily.      [name] zonisamide (ZONEGRAN) 100 MG capsule Take 200 mg by mouth at bedtime.       No current facility-administered medications for this visit.   No family history on file. History   Social History  . Marital Status: Married    Spouse Name: N/A    Number of Children: N/A  . Years of Education: N/A   Social History  Main Topics  . Smoking status: Former Smoker    Quit date: 06/03/2000  . Smokeless tobacco: None  . Alcohol Use: No  . Drug Use: No  . Sexual Activity: None   Other Topics Concern  . None   Social History Narrative  . None    Objective:  Physical Exam: Filed Vitals:   03/05/13 1357  BP: 126/68  Pulse: 80  Temp: 97.2 F (36.2 C)  TempSrc: Oral  Height: 5\' 1"  (1.549 m)  Weight: 204 lb 12.8 oz (92.897 kg)  SpO2: 96%   General:pleseant, appears as stated age, wheelchair (too far to walk, arthritis) HEENT: PERRL, EOMI, no scleral icterus Cardiac: RRR, +systolic murmur at right 2nd intercostal space Pulm: clear to auscultation bilaterally, moving normal volumes of air Abd: soft, nontender, nondistended, BS normoactive Ext: warm and well perfused, no pedal edema (compression stockings in place) Neuro: alert and oriented X3, cranial nerves II-XII grossly intact, 5/5 strength in b/l UE & LE  Review of EKG: non-specific T wave changes in V1 and V6 with PR int 440  Labs:    Component Value Date/Time   NA 137 03/05/2013 1450   K 4.4 03/05/2013 1450   CL 99 03/05/2013 1450   CO2 21 03/05/2013 1450   GLUCOSE 111* 03/05/2013 1450   BUN 65* 03/05/2013 1450   CREATININE 2.34* 03/05/2013 1450   CREATININE 1.91* 02/18/2013 0413   CALCIUM 9.7 03/05/2013 1450   GFRNONAA 26* 02/18/2013 0413   GFRAA 30* 02/18/2013 0413     Assessment & Plan:  Case and care discussed with Dr. Daryll Drown.  Please see problem oriented charting for further details. Patient to be admitted for further work up.

## 2013-03-05 NOTE — Assessment & Plan Note (Deleted)
Per patient, symptoms are typical of her seizure disorder. We will check troponin, ekg, cmet and cbc to rule out acute etiology such as ACS.  She is currently without CP or SOB, so will not get CXR.  I have also placed a call to [facility] neurology to discuss dosing of phenobarbital and patient follow up.

## 2013-03-05 NOTE — Assessment & Plan Note (Addendum)
Dizziness with near syncope & SOB this morning.  Recent admission for heart block. Patient felt that this was the typical presentation of her seizure disorder.  She was supposed to be taking 97.2mg +60mg  of phenobarbital but was confused and only taking 97.2mg .  She also has history of severe AS.    Initially, thought secondary to seizure d/o.  I spoke with patient's neurologist at [facility] (Dr. Assunta Found) who suggested that if this was a seizure, it would be reasonable to either restart prior dose of phenobarbital 180 qHS or phenobarb 97.2+32.4 qHS - the risk of the higher dose is risk of sedation/encephalopathy like she has had in the past; risk of the smaller dose is repeat seizures.  Patient has follow up with [facility] neuro in 05/2013.  Labs and EKG were done to r/o cardiac etiology.  EKG has nonspecific T wave changes with a prolonged PR int, initial troponin was negative.  Phenobarb was low normal.  Cr was 2.34 from 1.91 at hospital discharge (just less than 30% increase). At discharge, she was to stop ACEI, lasix, spironolactone, amlodipine and BB, but she has been taking this medication still. She was seen by EP during hospitalization due to 2:1 heart block with intermittent 3:2 Mobitz 1 heart block.  Plans for pacemaker were post-poned since she was hemodynamically stable and asymptomatic once off of medications. Orthostatic vital signs today only significant for rise in HR, not fall in pressure.  Given that she has been on a lower dose of phenobarb since Oct and level is low normal, I think this may be less likely due to her seizure.  Cardiac etiology likely given rise in Cr.  We are going to admit her to observation to continue to monitor her heart rate & rhythm, and also r/o ACS.    Admission discussed with Dr. Eula Fried. Patient to admitted to obs tele bed with seizure precautions.

## 2013-03-05 NOTE — Progress Notes (Signed)
Patient has arrived to unit, patient and vital signs are stable; will continue to monitor patient. Ruben Im RN

## 2013-03-06 ENCOUNTER — Encounter: Payer: Self-pay | Admitting: Internal Medicine

## 2013-03-06 ENCOUNTER — Other Ambulatory Visit: Payer: Self-pay

## 2013-03-06 ENCOUNTER — Encounter (HOSPITAL_COMMUNITY): Payer: Self-pay | Admitting: General Practice

## 2013-03-06 DIAGNOSIS — R42 Dizziness and giddiness: Secondary | ICD-10-CM

## 2013-03-06 DIAGNOSIS — N183 Chronic kidney disease, stage 3 unspecified: Secondary | ICD-10-CM

## 2013-03-06 DIAGNOSIS — A599 Trichomoniasis, unspecified: Secondary | ICD-10-CM

## 2013-03-06 DIAGNOSIS — I129 Hypertensive chronic kidney disease with stage 1 through stage 4 chronic kidney disease, or unspecified chronic kidney disease: Secondary | ICD-10-CM

## 2013-03-06 DIAGNOSIS — R21 Rash and other nonspecific skin eruption: Secondary | ICD-10-CM

## 2013-03-06 DIAGNOSIS — G40909 Epilepsy, unspecified, not intractable, without status epilepticus: Secondary | ICD-10-CM

## 2013-03-06 LAB — URINALYSIS, ROUTINE W REFLEX MICROSCOPIC
Bilirubin Urine: NEGATIVE
GLUCOSE, UA: NEGATIVE mg/dL
Hgb urine dipstick: NEGATIVE
Ketones, ur: NEGATIVE mg/dL
Nitrite: NEGATIVE
PH: 5 (ref 5.0–8.0)
Protein, ur: NEGATIVE mg/dL
Specific Gravity, Urine: 1.016 (ref 1.005–1.030)
Urobilinogen, UA: 0.2 mg/dL (ref 0.0–1.0)

## 2013-03-06 LAB — URINE MICROSCOPIC-ADD ON

## 2013-03-06 LAB — BASIC METABOLIC PANEL
BUN: 67 mg/dL — ABNORMAL HIGH (ref 6–23)
CO2: 19 mEq/L (ref 19–32)
Calcium: 9.4 mg/dL (ref 8.4–10.5)
Chloride: 105 mEq/L (ref 96–112)
Creatinine, Ser: 2.3 mg/dL — ABNORMAL HIGH (ref 0.50–1.10)
GFR calc Af Amer: 24 mL/min — ABNORMAL LOW (ref >90)
GFR calc non Af Amer: 21 mL/min — ABNORMAL LOW (ref >90)
GLUCOSE: 88 mg/dL (ref 70–99)
POTASSIUM: 4.4 meq/L (ref 3.7–5.3)
Sodium: 140 mEq/L (ref 137–147)

## 2013-03-06 LAB — GLUCOSE, CAPILLARY: GLUCOSE-CAPILLARY: 81 mg/dL (ref 70–99)

## 2013-03-06 LAB — TROPONIN I: Troponin I: <0.3 ng/mL (ref <0.30)

## 2013-03-06 MED ORDER — METRONIDAZOLE 500 MG PO TABS: 500.0000 mg | ORAL_TABLET | Freq: Once | ORAL | Status: DC

## 2013-03-06 MED ORDER — METRONIDAZOLE 500 MG PO TABS
2000.0000 mg | ORAL_TABLET | Freq: Once | ORAL | Status: AC
  Administered 2013-03-06: 2000 mg via ORAL
  Filled 2013-03-06: qty 4

## 2013-03-06 MED ORDER — SODIUM CHLORIDE 0.9 % IV SOLN
INTRAVENOUS | Status: AC
  Administered 2013-03-06: 09:00:00 via INTRAVENOUS

## 2013-03-06 NOTE — H&P (Signed)
  Date: 03/06/2013  Patient name: Sherry [name]  Medical record number: [id number]  Date of birth: 07-08-1945   I have seen and evaluated Sherry [name] and discussed their care with the Residency Team. [name] was admitted for 2 episodes of near syncope. She has h/o severe AS and known sz d/o.   Assessment and Plan: I have seen and evaluated the patient as outlined above. I agree with the formulated Assessment and Plan as detailed in the residents' admission note, with the following changes:   1. Near syncope - the likely etiology appears to be arrythmia as dr Percival Spanish documented Mobitz I & sinus tachy with CHB overnight although the tele strips are not yet loaded into the EHR. We will cont to monitor tele for next 48 hrs and cards will decide on pacer placement.   Other possible, but less likely etiologies are AMI - R/O via EKG and Trop I Sz - Her phenobarb dose was inappropriately low and her level was low. She states that these two pre-syncope episodes were a bit like her szs. We have corrected her phenobarb dose and will check level. BPPV - both episodes occurred when pt was bathing, involving head movements. Had episode this AM when moved from toilet to bed. But due to arrythmia, BPPV less likely.  AS - this is severe per ECHO but poor candidate for intervention.  Orthostatic - she was orthostatic per vitals. Gentle IVF for 12 hours and repeat. No decreased intake. Had been taking diurectics even though MD had stopped them.   2. HTN - agree with stopping all meds and following BP  3. A on chronic renal failure - hold all BP meds inc diuretics and give gentle IVF and follow Cr. Likely pre-renal.    Bartholomew Crews, MD 1/17/201512:53 PM

## 2013-03-06 NOTE — Progress Notes (Signed)
Pt a/o, no c/o pain, pt on NS @ 75 ml/hr, pt ambulated in hall way with walker, pt tolerated well, walked about 50 ft, pt stable

## 2013-03-06 NOTE — Progress Notes (Signed)
Subjective: Patient feels better this morning. She did have one episode of feeling lightheaded have perhaps had some mild indigestion after her bath this morning. No vision changes, SOB, CP, palpitations. She tells me she has been eating and drinking ok, though she has been fluid restricting herself per her cardiologist as well as taking her diuretics. Tele showed intermittent  second degree type 1 AV block w/ intermittent sinus tachycardia.   Objective: Vital signs in last 24 hours: Filed Vitals:   03/05/13 1902 03/05/13 2036 03/06/13 0156 03/06/13 0441  BP: 152/41 112/51 105/57 118/63  Pulse: 115 80 73 78  Temp: 98.6 F (37 C) 97.3 F (36.3 C) 98.1 F (36.7 C) 98 F (36.7 C)  TempSrc: Oral Oral Oral Oral  Resp: 18 18 16 18   Height:      Weight:    202 lb 13.2 oz (92 kg)  SpO2: 99% 100% 99% 100%   Weight change:   Intake/Output Summary (Last 24 hours) at 03/06/13 1246 Last data filed at 03/06/13 0843  Gross per 24 hour  Intake    720 ml  Output    350 ml  Net    370 ml    Patient was orthostatic by HR and DBP yesterday evening.  Physical Exam: General: alert, cooperative, sitting upright on side of bed and in no apparent distress HEENT: pupils equal round and reactive to light, vision grossly intact, oropharynx clear and non-erythematous, MMM Neck: supple Lungs: clear to ascultation bilaterally, normal work of respiration, no wheezes, rales, ronchi Heart: regular rate and rhythm, harsh 3/6 systolic murmur  Abdomen: soft, non-tender, non-distended, normal bowel sounds  Extremities: warm extremities, no BLE edema Neurologic: alert & oriented X3, cranial nerves II-XII intact, strength 5/5 throughout, sensation intact to light touch, cerebellar function intact  Lab Results: Basic Metabolic Panel:  Recent Labs Lab 03/05/13 1450 03/06/13 0420  NA 137 140  K 4.4 4.4  CL 99 105  CO2 21 19  GLUCOSE 111* 88  BUN 65* 67*  CREATININE 2.34* 2.30*  CALCIUM 9.7 9.4    Liver Function Tests:  Recent Labs Lab 03/05/13 1450  AST 18  ALT 14  ALKPHOS 73  BILITOT 0.2*  PROT 7.9  ALBUMIN 3.8   CBC:  Recent Labs Lab 03/05/13 1450  WBC 4.0  NEUTROABS 2.3  HGB 11.5*  HCT 34.7*  MCV 85.5  PLT 281   Cardiac Enzymes:  Recent Labs Lab 03/05/13 1450 03/05/13 2104 03/06/13 0420  CKTOTAL  --  69  --   CKMB  --  2.7  --   TROPONINI <0.30 0.31* <0.30   CBG:  Recent Labs Lab 03/06/13 0605  GLUCAP 81   Fasting Lipid Panel:  Recent Labs Lab 03/05/13 2104  CHOL 205*  HDL 67  LDLCALC 121*  TRIG 87  CHOLHDL 3.1   Urinalysis:  Recent Labs Lab 03/06/13 0519  COLORURINE YELLOW  LABSPEC 1.016  PHURINE 5.0  GLUCOSEU NEGATIVE  HGBUR NEGATIVE  BILIRUBINUR NEGATIVE  KETONESUR NEGATIVE  PROTEINUR NEGATIVE  UROBILINOGEN 0.2  NITRITE NEGATIVE  LEUKOCYTESUR LARGE*   Studies/Results: X-ray Chest Pa And Lateral   03/05/2013   CLINICAL DATA:  Chest pain.  Epigastric and abdominal pain.  EXAM: CHEST  2 VIEW  COMPARISON:  Chest x-ray 02/15/2013.  FINDINGS: Lung volumes are normal. No consolidative airspace disease. No pleural effusions. No evidence of pulmonary edema. Heart size appears borderline enlarged. Upper mediastinal contours are within normal limits. Atherosclerosis in the thoracic aorta.  IMPRESSION:  1. No radiographic evidence of acute cardiopulmonary disease. 2. Borderline cardiomegaly. 3. Atherosclerosis.   Electronically Signed   By: Vinnie Langton M.D.   On: 03/05/2013 21:53   Medications: I have reviewed the patient's current medications. Scheduled Meds: . aspirin EC  81 mg Oral Daily  . calcium-vitamin D  1 tablet Oral BID  . heparin  5,000 Units Subcutaneous Q8H  . PHENobarbital  162 mg Oral QHS  . simvastatin  20 mg Oral QHS  . zonisamide  200 mg Oral QHS   Continuous Infusions: . sodium chloride 75 mL/hr at 03/06/13 0853   PRN Meds:.acetaminophen, acetaminophen, ondansetron (ZOFRAN) IV,  ondansetron  Assessment/Plan:  # Near syncope: Patient had an episode of lightheadedness this morning, though this was not the same as her prior episodes. She is orthostatic and likely has prerenal AKI, therefore she will need fluid resuscitation. Tele showed intermittent mobitz type 1 AV block w/ some sinus tach overnight. Cardiology evaluated her and recommend observing over the weekend with possible pacemaker placement on Monday. ACS ruled out with 3 negative troponins (middle one slightly elevated, though this is trivial). CXR wnl. Lipid panel showed Total chol 205, LDL 121, TG 87, HDL 67. CK was wnl.  -continue daily ASA -continue statin -telemetry -seizure precautions, neuro checks - HOLD-amlodipine, lasix, lisinopril, spironolactone  -cardiology consult- appreciate recs  # Seizure disorder: Unclear if these episodes represent seizures. No seizure-like activity overnight. -continue phenobarbital 97.2mg  + 60mg  qHS and zonisamide 200mg  qHS  -seizure precautions and neuro checks as above   # HTN: BP 152/41 on admission, though somewhat low-normal this morning.  - holding home hydralazine   # Acute on CKD stage 3: Cr 2.34 on admission and today Cr 2.3. Baseline Cr approx 1.4-1.6 (1.46 10/2012, though 2.6-2.8 since 12/29). I believe this represents prerenal AKI given she has been fluid restriction herself at home, her BUN/Cr ratio >20 and she is orthostatic. -IVF 75cc/hr x 12 hours -recheck orthostatics tonight  #Trichomonas- found on UA. Will give metronidazole 2g x 1 dose. Will need to counsel patient that partner needs to be treated.  # VTE: heparin  # Diet: HH  Code status: full  Dispo: Disposition is deferred at this time, awaiting improvement of current medical problems.  Anticipated discharge in approximately 2 day(s).   The patient does have a current PCP (Charlann Lange, MD) and does not need an [facility] hospital follow-up appointment after discharge.  The patient does not have  transportation limitations that hinder transportation to clinic appointments.  .Services Needed at time of discharge: Y = Yes, Blank = No PT:   OT:   RN:   Equipment:   Other:     LOS: 1 day   Rebecca Eaton, MD 03/06/2013, 12:46 PM

## 2013-03-06 NOTE — Progress Notes (Signed)
CRITICAL VALUE ALERT  Critical value received: 0.31 Troponin  Date of notification:  03/05/2012  Time of notification:  2100  Critical value read back: yes  Nurse who received alert:  Renelda Loma RN  MD notified (1st page):  Dr. Nicoletta Dress  Time of first page:  2105  Responding MD:  Dr. Nicoletta Dress  Time MD responded:  2110  A CK MB was added to the previous blood draw and a UA was also ordered.  The MD asked the RN to page with the results of both.  The RN will carry out the orders.

## 2013-03-06 NOTE — Progress Notes (Signed)
SUBJECTIVE:  No further presyncope   PHYSICAL EXAM Filed Vitals:   03/05/13 1902 03/05/13 2036 03/06/13 0156 03/06/13 0441  BP: 152/41 112/51 105/57 118/63  Pulse: 115 80 73 78  Temp: 98.6 F (37 C) 97.3 F (36.3 C) 98.1 F (36.7 C) 98 F (36.7 C)  TempSrc: Oral Oral Oral Oral  Resp: 18 18 16 18   Height:      Weight:    202 lb 13.2 oz (92 kg)  SpO2: 99% 100% 99% 100%   General:  No distress Lungs:  Clear Heart:  RRR Abdomen: Positive bowel sounds, no rebound no guarding Extremities:  No edema   LABS: Lab Results  Component Value Date   TROPONINI <0.30 03/06/2013   Results for orders placed during the hospital encounter of 03/05/13 (from the past 24 hour(s))  TROPONIN I     Status: Abnormal   Collection Time    03/05/13  9:04 PM      Result Value Range   Troponin I 0.31 (*) <0.30 ng/mL  LIPID PANEL     Status: Abnormal   Collection Time    03/05/13  9:04 PM      Result Value Range   Cholesterol 205 (*) 0 - 200 mg/dL   Triglycerides 87  <150 mg/dL   HDL 67  >39 mg/dL   Total CHOL/HDL Ratio 3.1     VLDL 17  0 - 40 mg/dL   LDL Cholesterol 121 (*) 0 - 99 mg/dL  CK TOTAL AND CKMB     Status: None   Collection Time    03/05/13  9:04 PM      Result Value Range   Total CK 69  7 - 177 U/L   CK, MB 2.7  0.3 - 4.0 ng/mL   Relative Index RELATIVE INDEX IS INVALID  0.0 - 2.5  TROPONIN I     Status: None   Collection Time    03/06/13  4:20 AM      Result Value Range   Troponin I <0.30  <0.30 ng/mL  BASIC METABOLIC PANEL     Status: Abnormal   Collection Time    03/06/13  4:20 AM      Result Value Range   Sodium 140  137 - 147 mEq/L   Potassium 4.4  3.7 - 5.3 mEq/L   Chloride 105  96 - 112 mEq/L   CO2 19  19 - 32 mEq/L   Glucose, Bld 88  70 - 99 mg/dL   BUN 67 (*) 6 - 23 mg/dL   Creatinine, Ser 2.30 (*) 0.50 - 1.10 mg/dL   Calcium 9.4  8.4 - 10.5 mg/dL   GFR calc non Af Amer 21 (*) >90 mL/min   GFR calc Af Amer 24 (*) >90 mL/min  URINALYSIS, ROUTINE W REFLEX  MICROSCOPIC     Status: Abnormal   Collection Time    03/06/13  5:19 AM      Result Value Range   Color, Urine YELLOW  YELLOW   APPearance CLOUDY (*) CLEAR   Specific Gravity, Urine 1.016  1.005 - 1.030   pH 5.0  5.0 - 8.0   Glucose, UA NEGATIVE  NEGATIVE mg/dL   Hgb urine dipstick NEGATIVE  NEGATIVE   Bilirubin Urine NEGATIVE  NEGATIVE   Ketones, ur NEGATIVE  NEGATIVE mg/dL   Protein, ur NEGATIVE  NEGATIVE mg/dL   Urobilinogen, UA 0.2  0.0 - 1.0 mg/dL   Nitrite NEGATIVE  NEGATIVE   Leukocytes, UA  LARGE (*) NEGATIVE  URINE MICROSCOPIC-ADD ON     Status: Abnormal   Collection Time    03/06/13  5:19 AM      Result Value Range   Squamous Epithelial / LPF RARE  RARE   WBC, UA 7-10  <3 WBC/hpf   RBC / HPF 0-2  <3 RBC/hpf   Bacteria, UA FEW (*) RARE   Casts HYALINE CASTS (*) NEGATIVE   Urine-Other TRICHOMONAS PRESENT    GLUCOSE, CAPILLARY     Status: None   Collection Time    03/06/13  6:05 AM      Result Value Range   Glucose-Capillary 81  70 - 99 mg/dL    Intake/Output Summary (Last 24 hours) at 03/06/13 1204 Last data filed at 03/06/13 0843  Gross per 24 hour  Intake    720 ml  Output    350 ml  Net    370 ml    ASSESSMENT AND PLAN:  PRESYNCOPE:  She did have Mobitz I overnight and what appeared to be a brief episode of sinus tachy with CHB.  I would like to observer the rhythm on telemetry over the weekend and make a decision about possible pacemaker on Monday.   ELEVATED TROPONIN:  Trivial troponin elevation.  This does not represent an acute ischemi episode.  No further work up of this is indicated.  EKG reviewed today.  No change from yesterday with LVH and repolarization.    AS:   This is likely severe.  However, we will follow this as an outpatient as mentioned in the consult note.   Jeneen Rinks [facility] 03/06/2013 12:04 PM

## 2013-03-07 LAB — BASIC METABOLIC PANEL
BUN: 55 mg/dL — ABNORMAL HIGH (ref 6–23)
CO2: 18 mEq/L — ABNORMAL LOW (ref 19–32)
Calcium: 9.4 mg/dL (ref 8.4–10.5)
Chloride: 104 mEq/L (ref 96–112)
Creatinine, Ser: 2.03 mg/dL — ABNORMAL HIGH (ref 0.50–1.10)
GFR calc non Af Amer: 24 mL/min — ABNORMAL LOW (ref >90)
GFR, EST AFRICAN AMERICAN: 28 mL/min — AB (ref >90)
Glucose, Bld: 86 mg/dL (ref 70–99)
Potassium: 4.8 mEq/L (ref 3.7–5.3)
SODIUM: 138 meq/L (ref 137–147)

## 2013-03-07 LAB — GLUCOSE, CAPILLARY: GLUCOSE-CAPILLARY: 81 mg/dL (ref 70–99)

## 2013-03-07 LAB — URINE CULTURE: Colony Count: 85000

## 2013-03-07 MED ORDER — SODIUM CHLORIDE 0.9 % IV SOLN
INTRAVENOUS | Status: AC
  Administered 2013-03-07: 21:00:00 via INTRAVENOUS

## 2013-03-07 MED ORDER — SODIUM CHLORIDE 0.9 % IJ SOLN
3.0000 mL | Freq: Two times a day (BID) | INTRAMUSCULAR | Status: DC
  Administered 2013-03-07 – 2013-03-08 (×2): 3 mL via INTRAVENOUS

## 2013-03-07 NOTE — Progress Notes (Addendum)
orth stat bp done sbp 126 lying 138 sitting 186 standing ( RN - and NT had to help Pt stand with walker) Pt stated that standing is very difficult for her. Set Pt back in bed SBP 175 lying will recheck in 10 min. Will continue to monitor

## 2013-03-07 NOTE — Progress Notes (Signed)
SUBJECTIVE:  She apparently did have an episode of discomfort and her husband put ice on her neck like he dose at home.  This was not presyncope.  The nurses did not notice any arrhythmias on tele at that time.  I have again reviewed the tele strips.  She has sinus tach with minimal exertion (bathing).  She has dyspnea with minimal exertion.  Tele with Mobitz I, first degree AV block, 2:1 block.   PHYSICAL EXAM Filed Vitals:   03/06/13 2012 03/06/13 2014 03/07/13 0130 03/07/13 0513  BP: 118/69 130/91 121/61 147/73  Pulse: 74 116 72 94  Temp:   98.1 F (36.7 C) 98 F (36.7 C)  TempSrc:   Oral Oral  Resp:   18 20  Height:      Weight:    204 lb 5.9 oz (92.7 kg)  SpO2:   98% 99%   General:  No distress Lungs:  Clear Heart:  RRR Abdomen: Positive bowel sounds, no rebound no guarding Extremities:  Right greater than left edema   LABS: Lab Results  Component Value Date   TROPONINI <0.30 03/06/2013   Results for orders placed during the hospital encounter of 03/05/13 (from the past 24 hour(s))  BASIC METABOLIC PANEL     Status: Abnormal   Collection Time    03/07/13  5:05 AM      Result Value Range   Sodium 138  137 - 147 mEq/L   Potassium 4.8  3.7 - 5.3 mEq/L   Chloride 104  96 - 112 mEq/L   CO2 18 (*) 19 - 32 mEq/L   Glucose, Bld 86  70 - 99 mg/dL   BUN 55 (*) 6 - 23 mg/dL   Creatinine, Ser 2.03 (*) 0.50 - 1.10 mg/dL   Calcium 9.4  8.4 - 10.5 mg/dL   GFR calc non Af Amer 24 (*) >90 mL/min   GFR calc Af Amer 28 (*) >90 mL/min  GLUCOSE, CAPILLARY     Status: None   Collection Time    03/07/13  6:06 AM      Result Value Range   Glucose-Capillary 81  70 - 99 mg/dL   Comment 1 Notify RN     Comment 2 Documented in Chart      Intake/Output Summary (Last 24 hours) at 03/07/13 1040 Last data filed at 03/07/13 0842  Gross per 24 hour  Intake    840 ml  Output      0 ml  Net    840 ml    ASSESSMENT AND PLAN:  PRESYNCOPE:  She did have Mobitz I overnight.  Two  nights ago there was what appeared to be a brief episode of sinus tachy with CHB.  I cannot find this now to print it but there has been no further evidence of this.  I will have EP see her on rounds tomorrow to review.  At the very least she needs out patient event monitoring.  At this point I am leaning against pacing unless we can prove symptoms are related to slow heart rate or there is high degree heart block off of beta blocker  ELEVATED TROPONIN:  Trivial troponin elevation.  This does not represent an acute ischemi episode.  No further work up of this is indicated.    AS:   This is likely severe.  However, we will follow this as an outpatient as mentioned in the consult note.  This likely contributes to dyspnea and might contribute to "  episodes"   Minus Breeding 03/07/2013 10:40 AM

## 2013-03-07 NOTE — Progress Notes (Signed)
Pt states had a breif "episode" after lying back down from orthstat bp. Pt state it resolved quickly

## 2013-03-07 NOTE — Progress Notes (Signed)
Rn put pt on continuus o2 monitor per IM, Rn reviewed tele [phone] when Pt was have pain episode per cards request. Tele show heart block  @ times and tachy 100-120 @ times. Will continue to monitor Pt

## 2013-03-07 NOTE — Progress Notes (Signed)
Subjective: Sherry [name] was seen and examined at bedside.  She was doing well this morning and denied dizziness but shortly after I left she had another one of her "episodes" where she felt tightness in her throat and chest and trouble breathing.  The episode spontaneous resolved after a few minutes and with ice pack applied to throat.  On re-evaluation she was feeling better but tired and anxious.    Tele strips overnight reveal episodes of bradycardia down to HR 40's and tachycardia along with type 2 heart block and at times type 1 block as well.   Objective: Vital signs in last 24 hours: Filed Vitals:   03/06/13 2012 03/06/13 2014 03/07/13 0130 03/07/13 0513  BP: 118/69 130/91 121/61 147/73  Pulse: 74 116 72 94  Temp:   98.1 F (36.7 C) 98 F (36.7 C)  TempSrc:   Oral Oral  Resp:   18 20  Height:      Weight:    204 lb 5.9 oz (92.7 kg)  SpO2:   98% 99%   Weight change: 1 lb 1.6 oz (0.5 kg)  Intake/Output Summary (Last 24 hours) at 03/07/13 1047 Last data filed at 03/07/13 0842  Gross per 24 hour  Intake    840 ml  Output      0 ml  Net    840 ml    Patient was orthostatic by HR yesterday after fluids.   Physical Exam: Vitals reviewed. General: resting in bed, anxious but not in acute distress HEENT: PERRL, EOMI Cardiac: RRR [id number] loud pansystolic murmur, loudest at L upper sternal border Pulm: clear to auscultation bilaterally, no wheezes Abd: soft, nontender, nondistended, BS present Ext: warm and well perfused, no pedal edema,  dp pulses present, darkened discoloration of b/l extremities with healing scars, reports pain in b/l knees, appears to have increased skin/fat on medial side of b/l knees but no tenderness to palpation, erythema or warmth to touch Neuro: alert and oriented X3, cranial nerves II-XII grossly intact, strength equal in bilateral upper and lower extremities.  Lab Results: Basic Metabolic Panel:  Recent Labs Lab 03/06/13 0420 03/07/13 0505    NA 140 138  K 4.4 4.8  CL 105 104  CO2 19 18*  GLUCOSE 88 86  BUN 67* 55*  CREATININE 2.30* 2.03*  CALCIUM 9.4 9.4   Liver Function Tests:  Recent Labs Lab 03/05/13 1450  AST 18  ALT 14  ALKPHOS 73  BILITOT 0.2*  PROT 7.9  ALBUMIN 3.8   CBC:  Recent Labs Lab 03/05/13 1450  WBC 4.0  NEUTROABS 2.3  HGB 11.5*  HCT 34.7*  MCV 85.5  PLT 281   Cardiac Enzymes:  Recent Labs Lab 03/05/13 1450 03/05/13 2104 03/06/13 0420  CKTOTAL  --  69  --   CKMB  --  2.7  --   TROPONINI <0.30 0.31* <0.30   CBG:  Recent Labs Lab 03/06/13 0605 03/07/13 0606  GLUCAP 81 81   Fasting Lipid Panel:  Recent Labs Lab 03/05/13 2104  CHOL 205*  HDL 67  LDLCALC 121*  TRIG 87  CHOLHDL 3.1   Urinalysis:  Recent Labs Lab 03/06/13 0519  COLORURINE YELLOW  LABSPEC 1.016  PHURINE 5.0  GLUCOSEU NEGATIVE  HGBUR NEGATIVE  BILIRUBINUR NEGATIVE  KETONESUR NEGATIVE  PROTEINUR NEGATIVE  UROBILINOGEN 0.2  NITRITE NEGATIVE  LEUKOCYTESUR LARGE*   Studies/Results: X-ray Chest Pa And Lateral   03/05/2013   CLINICAL DATA:  Chest pain.  Epigastric and abdominal pain.  EXAM: CHEST  2 VIEW  COMPARISON:  Chest x-ray 02/15/2013.  FINDINGS: Lung volumes are normal. No consolidative airspace disease. No pleural effusions. No evidence of pulmonary edema. Heart size appears borderline enlarged. Upper mediastinal contours are within normal limits. Atherosclerosis in the thoracic aorta.  IMPRESSION: 1. No radiographic evidence of acute cardiopulmonary disease. 2. Borderline cardiomegaly. 3. Atherosclerosis.   Electronically Signed   By: Vinnie Langton M.D.   On: 03/05/2013 21:53   Medications: I have reviewed the patient's current medications. Scheduled Meds: . aspirin EC  81 mg Oral Daily  . calcium-vitamin D  1 tablet Oral BID  . heparin  5,000 Units Subcutaneous Q8H  . PHENobarbital  162 mg Oral QHS  . simvastatin  20 mg Oral QHS  . zonisamide  200 mg Oral QHS   Continuous  Infusions:   PRN Meds:.acetaminophen, acetaminophen, ondansetron (ZOFRAN) IV, ondansetron  Assessment/Plan: Sherry [name] is a 68 year old female with PMH severe AS, obesity, seizure disorder, dCHF, and CKD3 admitted for near syncope and found to have non-specific t wave changes on EKG. Recent hospital admission by cardiology for heart block.   Near syncope in setting of severe AS and orthostatic hypotension--likely further exacerbated by dehydration in setting of continuing to take her medications that were discontinued on last hospitalization including lasix, spironolactone, lisinopril, and amlodipine.  Light-headedness has improved slightly with IVF hydration and drinking and eating well today, however, remained orthostatic by HR yesterday night.  Had another "episode" this morning that spontaneous resolved and was more trouble breathing and throat chest pain.  Cardiology has been following.  Tele-monitoring reveals episodes of bradycardia and tachycardia along with type 1 and 2 heart blocks.  She does have severe AS, but deemed to be a poor candidate for any valve procedure and recommended to have outpatient follow up with Dr. Burt Knack.  Pacemaker remain in consideration this admission.   -continue daily ASA -continue statin -tele monitoring -made sure that prior medications including amlodipine, lasix, lisinopril, spironolactone that patient was still taking despite being instructed to discontinue have been discontinued and patient and family have been counseled -appreciate cardiology following, EP to evaluate tomorrow, ?pacemaker placement with outpatient follow up for severe AS (with Dr. Burt Knack) -continue fluid hydration and recheck orthostatic vital signs -continue pulse-oximetry  -neuro checks  Seizure disorder: followed at [facility], on phenobarbital.  Apparently had phenobarbital dose reduced to 97.2 and 60mg  qhs but patient had only been taking 97.2mg  daily prior to admission.   Phenobarbital level on admission was 24.3 (reference range 15-40) and resumed on the dose she should be taking, total 157.2mg  daily for now and will need to have level check in approximately 4-5 days for steady state to be reached.  No apparent seizure episodes since admissions.   -continue phenobarbital and zonisamide -seizure precautions and neuro checks   HTN: BP stable off all anti-hypertensive medications.  This morning BP 147/73 but noted to have BP of 90's/60's overnight.  Counseled to discontinue all prior medications that she continued to take including lasix, spironolactone, lisinopril, and amlodipine.  Will continue to hold hydralazine 50mg  TID for now given BP but may need to resume if trending back up. - recheck orthostatic vital signs - may need to resume hydralazine  Acute on CKD stage 3--improving with hydration.  Likely pre-renal in setting of dehydration from continuing diuretic medications despite them being discontinued on prior hospitalization.  Cr was 2.34 on admission and improved to 2.03 today.  -will continue to hydrate  today -recheck orthostatics   Trichomonas- found on UA. Given 2g x 1 dose yesterday.  Patient denies having sex for years and says she has been monogamous with husband, thus, she is very confused how she has it.  She was counseled on need to treat partners as well and said she will discuss with husband, but knows that he has not been with anyone either.    VTE: heparin  Diet: HH  Code status: full  Dispo: Disposition is deferred at this time, awaiting improvement of current medical problems.  Anticipated discharge in approximately 2-3 day(s).   The patient does have a current PCP (Charlann Lange, MD) and does not need an [facility] hospital follow-up appointment after discharge.  The patient does not have transportation limitations that hinder transportation to clinic appointments.  Services Needed at time of discharge: Y = Yes, Blank = No PT:   OT:   RN:     Equipment:   Other:     LOS: 2 days   Jerene Pitch, MD 03/07/2013, 10:47 AM

## 2013-03-07 NOTE — Progress Notes (Signed)
Pt states indigestion and throat pain. Pt states in with not stop, Pt states 10 pain. MD page for prn. Ice pack given, husband states he has been using ice pack for her pain

## 2013-03-08 DIAGNOSIS — R55 Syncope and collapse: Secondary | ICD-10-CM

## 2013-03-08 DIAGNOSIS — I441 Atrioventricular block, second degree: Secondary | ICD-10-CM

## 2013-03-08 LAB — BASIC METABOLIC PANEL
BUN: 51 mg/dL — ABNORMAL HIGH (ref 6–23)
CO2: 19 mEq/L (ref 19–32)
CREATININE: 1.9 mg/dL — AB (ref 0.50–1.10)
Calcium: 9.2 mg/dL (ref 8.4–10.5)
Chloride: 105 mEq/L (ref 96–112)
GFR, EST AFRICAN AMERICAN: 31 mL/min — AB (ref >90)
GFR, EST NON AFRICAN AMERICAN: 26 mL/min — AB (ref >90)
GLUCOSE: 86 mg/dL (ref 70–99)
Potassium: 4.5 mEq/L (ref 3.7–5.3)
Sodium: 138 mEq/L (ref 137–147)

## 2013-03-08 LAB — GLUCOSE, CAPILLARY: Glucose-Capillary: 90 mg/dL (ref 70–99)

## 2013-03-08 MED ORDER — PHENOBARBITAL 60 MG PO TABS: 60.0000 mg | ORAL_TABLET | Freq: Every day | ORAL | Status: DC

## 2013-03-08 NOTE — Progress Notes (Signed)
Subjective: Sherry [name] feels well this morning. No more presyncopal episodes overnight. She denies CP, SOB, palpitations. Tele shows first degree AV block, with intermittent second degree type 1. Also with some intermittent sinus tachycardia.   Objective: Vital signs in last 24 hours: Filed Vitals:   03/07/13 1841 03/07/13 1856 03/07/13 2057 03/08/13 0502  BP: 175/62 121/59 103/53 140/77  Pulse: 99 71 65 70  Temp:   97.4 F (36.3 C) 98.1 F (36.7 C)  TempSrc:   Oral Oral  Resp:  18 18 20   Height:      Weight:    206 lb 11.2 oz (93.759 kg)  SpO2: 98% 99% 98% 98%   Weight change: 2 lb 5.3 oz (1.059 kg)  Intake/Output Summary (Last 24 hours) at 03/08/13 0947 Last data filed at 03/08/13 0900  Gross per 24 hour  Intake    683 ml  Output    700 ml  Net    -17 ml    Patient remains orthostatic by HR only  Physical Exam: General: resting in bed, appears calm  HEENT: [place]/AT, vision grossly intact Cardiac: RRR [id number] loud pansystolic murmur, loudest at R upper sternal border Pulm: clear to auscultation bilaterally Abd: soft, nontender, nondistended, BS present Ext: warm and well perfused, no pedal edema Neuro: alert and oriented X3, cranial nerves II-XII grossly intact, strength equal in bilateral upper and lower extremities.  Lab Results: Basic Metabolic Panel:  Recent Labs Lab 03/07/13 0505 03/08/13 0333  NA 138 138  K 4.8 4.5  CL 104 105  CO2 18* 19  GLUCOSE 86 86  BUN 55* 51*  CREATININE 2.03* 1.90*  CALCIUM 9.4 9.2   Liver Function Tests:  Recent Labs Lab 03/05/13 1450  AST 18  ALT 14  ALKPHOS 73  BILITOT 0.2*  PROT 7.9  ALBUMIN 3.8   CBC:  Recent Labs Lab 03/05/13 1450  WBC 4.0  NEUTROABS 2.3  HGB 11.5*  HCT 34.7*  MCV 85.5  PLT 281   Cardiac Enzymes:  Recent Labs Lab 03/05/13 1450 03/05/13 2104 03/06/13 0420  CKTOTAL  --  69  --   CKMB  --  2.7  --   TROPONINI <0.30 0.31* <0.30   CBG:  Recent Labs Lab 03/06/13 0605  03/07/13 0606 03/08/13 0624  GLUCAP 81 81 90   Fasting Lipid Panel:  Recent Labs Lab 03/05/13 2104  CHOL 205*  HDL 67  LDLCALC 121*  TRIG 87  CHOLHDL 3.1   Urinalysis:  Recent Labs Lab 03/06/13 0519  COLORURINE YELLOW  LABSPEC 1.016  PHURINE 5.0  GLUCOSEU NEGATIVE  HGBUR NEGATIVE  BILIRUBINUR NEGATIVE  KETONESUR NEGATIVE  PROTEINUR NEGATIVE  UROBILINOGEN 0.2  NITRITE NEGATIVE  LEUKOCYTESUR LARGE*   Studies/Results: No results found. Medications: I have reviewed the patient's current medications. Scheduled Meds: . aspirin EC  81 mg Oral Daily  . calcium-vitamin D  1 tablet Oral BID  . heparin  5,000 Units Subcutaneous Q8H  . PHENobarbital  162 mg Oral QHS  . simvastatin  20 mg Oral QHS  . sodium chloride  3 mL Intravenous Q12H  . zonisamide  200 mg Oral QHS   Continuous Infusions:   PRN Meds:.acetaminophen, acetaminophen, ondansetron (ZOFRAN) IV, ondansetron  Assessment/Plan:  Near syncope in setting of severe AS and orthostatic hypotension--Patient's symptoms have improved. Perhaps 2/2 severe aortic stenosis, though cardiology wants to do close outpatient follow up for this issue. I do not believe these episodes are seizures. Patient remains orthostatic only by HR  even after fluid resuscitation. She will need close follow up with her PCP and her cardiologist. Patient does have first degree AV block w/ intermittent second degree type 1 AV block. Cardiology feels as though patient can be discharged with 30 day heart monitor w/ close follow up.  -continue daily ASA -continue statin -tele monitoring- will discharge with 30 day cardiac monitor -made sure that prior medications including amlodipine, lasix, lisinopril, spironolactone that patient was still taking despite being instructed to discontinue have been discontinued and patient and family have been counseled -appreciate cardiology following- pt has follow up with Dr Burt Knack on 2/9 -continue pulse-oximetry   -neuro checks  Seizure disorder: Stable, no seizure like activity during this admission.  -continue phenobarbital and zonisamide -seizure precautions and neuro checks   HTN: BP is slightly elevated, though with wide range of blood pressure readings: 103-175/60s. She is not on any antihypertensives and given she remains orthostatic by HR, will continue holding her home hydralazine. She was counseled to discontinue all prior medications that she continued to take including lasix, spironolactone, lisinopril, and amlodipine.  - plan to hold home hydralazine at discharge with close PCP follow up  Acute on CKD stage 3--improving with hydration.  Cr was 2.34 on admission and is 1.90 today.   Trichomonas- found on UA, s/o flagyl 2g x 1 dose.  VTE: heparin  Diet: HH  Code status: full  Dispo: Disposition is deferred at this time, awaiting improvement of current medical problems.  Anticipated discharge in approximately 2-3 day(s).   The patient does have a current PCP (Charlann Lange, MD) and does not need an [facility] hospital follow-up appointment after discharge.  The patient does not have transportation limitations that hinder transportation to clinic appointments.  Services Needed at time of discharge: Y = Yes, Blank = No PT:   OT:   RN:   Equipment:   Other:     LOS: 3 days   Rebecca Eaton, MD 03/08/2013, 9:47 AM

## 2013-03-08 NOTE — Discharge Summary (Signed)
Name: Sherry [name] MRN: [id number] DOB: 05-13-1945 68 y.o. PCP: Charlann Lange, MD  Date of Admission: 03/05/2013  6:30 PM Date of Discharge: 03/08/2013 Attending Physician: Axel Filler, MD  Discharge Diagnosis: Principal Problem:   Near syncope- unclear etiology; severe AS vs symptomatic AV block vs orthostasis vs seizure Active Problems:   HYPERLIPIDEMIA   HYPERTENSION   SEIZURE DISORDER   Morbid obesity with BMI of 40.0-44.9, adult   CKD (chronic kidney disease) stage 3, GFR 30-59 ml/min   Chronic diastolic congestive heart failure   Severe aortic stenosis   1St degree AV block and intermittent Mobitz type 1- discharged with plan for 30 day event monitor  Discharge Medications:   Medication List    STOP taking these medications       amLODipine 10 MG tablet  Commonly known as:  NORVASC     furosemide 40 MG tablet  Commonly known as:  LASIX     hydrALAZINE 50 MG tablet  Commonly known as:  APRESOLINE     lisinopril 40 MG tablet  Commonly known as:  PRINIVIL,ZESTRIL     spironolactone 50 MG tablet  Commonly known as:  ALDACTONE      TAKE these medications       aspirin 81 MG EC tablet  Take 81 mg by mouth daily.     CALCIUM 500 +D 500-400 MG-UNIT Tabs  Generic drug:  Calcium Carb-Cholecalciferol  Take 1 tablet by mouth 2 (two) times daily.     OVER THE COUNTER MEDICATION  Place 1 application into the nose at bedtime. Nasal product     PHENobarbital 97.2 MG tablet  Commonly known as:  LUMINAL  Take 97.2 mg by mouth. Take with 60mg  tab     PHENObarbital 60 MG tablet  Commonly known as:  LUMINAL  Take 1 tablet (60 mg total) by mouth at bedtime. Take with 97.2mg  tab     pravastatin 40 MG tablet  Commonly known as:  PRAVACHOL  Take 40 mg by mouth daily.     Vitamin D (Ergocalciferol) 50000 UNITS Caps capsule  Commonly known as:  DRISDOL  Take 50,000 Units by mouth every 7 (seven) days. On Friday     zonisamide 100 MG capsule  Commonly known as:   ZONEGRAN  Take 200 mg by mouth at bedtime.        Disposition and follow-up:   Sherry [name] was discharged from [facility] in Stable condition.  At the hospital follow up visit please address:  1.  HTN- patient's hydralazine was held at discharge- please evaluate for continued orthostatic hypotension as well as elevated BP 2. AKI- may be wise to recheck BMP to evaluate Cr 3. Near syncope- please evaluate for further episodes; also make sure she has received her 30 day event monitor from cardiology 4. Seizures- patient had been taking less than the recommended dose of phenobarbital- please make sure she is taking phenobarbital 97.2mg  PLUS 60mg  (together) qHS. Can consider rechecking phenobarbital level as it was low-normal at time of admission.  2.  Labs / imaging needed at time of follow-up: BMP  3.  Pending labs/ test needing follow-up:   Follow-up Appointments:     Follow-up Information   Follow up with Sherren Mocha, MD On 03/29/2013. (@3pm )    Specialty:  Cardiology   Contact information:   [address] [place] [address] [phone]       Follow up with LI, NA, MD. Call in  4 days. (Please call to make an appointment within the next week)    Specialty:  Internal Medicine   Contact information:   [address] [place] [address] [phone]       Follow up with Virl Axe, MD. (monitor placement at 12:45pm)    Specialty:  Cardiology   Contact information:   [id number] [place] [address] [phone]      Discharge Instructions: Discharge Orders   Future Appointments Provider Department Dept Phone   03/09/2013 12:45 PM [facility] [place] [facility] [phone]   03/29/2013 2:00 PM [facility] Echo Echo [place] [facility] ECHO LAB [phone]   03/29/2013 3:00 PM Sherren Mocha, MD [place] Office [phone]   04/12/2013 10:00 AM Charlann Lange, MD [place] [phone]   Future Orders Complete By Expires   Call MD for:  difficulty breathing, headache or visual disturbances  As directed    Call MD for:  extreme fatigue  As directed    Call MD for:  persistant dizziness or light-headedness  As directed    Call MD for:  persistant nausea and vomiting  As directed    Call MD for:  temperature >100.4  As directed    Diet - low sodium heart healthy  As directed    Increase activity slowly  As directed       Consultations:  cardiology, EP  Procedures Performed:  X-ray Chest Pa And Lateral   03/05/2013   CLINICAL DATA:  Chest pain.  Epigastric and abdominal pain.  EXAM: CHEST  2 VIEW  COMPARISON:  Chest x-ray 02/15/2013.  FINDINGS: Lung volumes are normal. No consolidative airspace disease. No pleural effusions. No evidence of pulmonary edema. Heart size appears borderline enlarged. Upper mediastinal contours are within normal limits. Atherosclerosis in the thoracic aorta.  IMPRESSION: 1. No radiographic evidence of acute cardiopulmonary disease. 2. Borderline cardiomegaly. 3. Atherosclerosis.   Electronically Signed   By: Vinnie Langton M.D.   On: 03/05/2013 21:53   Dg Chest Port 1 View  02/15/2013   CLINICAL DATA:  Bradycardia  EXAM: PORTABLE CHEST - 1 VIEW  COMPARISON:  None.  FINDINGS: Cardiac shadow is mildly enlarged. A monitoring device is noted over the midline. The lungs are clear. No bony abnormality is noted.  IMPRESSION: No acute abnormality noted.   Electronically Signed   By: Inez Catalina M.D.   On: 02/15/2013 12:09   Admission HPI:  Sherry [name] is a 68 y.o. woman with h/o seizure, HTN, HLD, CKD stage 3, CVA and severe aortic stenosis (per Echo 01/2013) who presents w/ c/o near syncope.  Patient reports she was taking a bath at 8AM this morning when after a few minutes of being in the tub she had a feeling of nausea, indigestion/burning in her chest, SOB, some belching, and room  spinning dizziness that lasted about 25 minutes in total. Patient also notes that she had no LOC, though feels as though her memory of the event is somewhat foggy. She had some shaking of her L arm during this episode as well that lasted only about 5 minutes (witnessed by husband). Patient also feels as though for a few minutes she was seeing "double" during this episode. She had an identical episode of this while bathing on Monday of this week that lasted about 1 hour. In between episodes she has felt completely herself, no DOE, no chest pain, diaphoresis,  vomiting, dysuria, F/C. She denies falling or LOC during these episodes, though she did feel like she slumped over.  Patient is followed at [facility] neurology for her seizures. Per chart review, patient was seen last in Oct 2014 at which time her phenobarb level was elevated at 41 and patient had been drowsy in clinic. Neurologist called patient and daughter to let them know to decrease the phenobarb dosing from 180mg  daily to 97.5mg  + 60mg  daily. However, patient never got this message and had only been taking 97.5mg  along with the prescribed zonisamide 200mg  qHS.  Additionally, patient was recently hospitalized on the Cardiology service (12/29-1/1) for dizziness, found to have bradycardia to high 20s w/ 2:1 heart block w/ intermittent 3:2 mobitz type 1 AV block. During this admission, patient's symptoms resolved, so pace maker was not placed. They discontinued her metoprolol, lasix, lisinopril, spironolactone, amlodipine. However, patient has been taking all of these medications except for the metoprolol. Upon discharge they added hydralazine 50mg  TID for HTN control.  Pt was admitted from clinic and found to have phenobarbital level of 24 (low-normal). Troponin negative x 1. CMP significant for glucose 111, BUN 65 and Cr 2.34 (baseline Cr 1.4-1.6). EKG showed NSR long PR w/ nonspecific T wave changes.  Patient does have home health PT that works with her. She  lives at home with her husband.  Hospital Course by problem list:  Near syncope in setting of severe AS and orthostatic hypotension--Etiology remains unclear- severe AS vs seizure (had been on decreased dose of phenobarbital) vs symptomatic Mobitz type 1 AV block vs orthostatic hypotension. Pt had been taking all of her home medications (lasix, lisinopril, spironolactone, amlodipine) despite the fact that these had been discontinued at her last discharge earlier this month. Pt had stopped metoprolol, however. ACS was ruled out with 3 negative troponins (middle one slightly elevated, though this is trivial). CXR wnl. Lipid panel showed Total chol 205, LDL 121, TG 87, HDL 67. CK was wnl. Cardiology was consulted and they recommended close outpatient f/u for her AS- though she is a poor candidate for total valve replacement. Pt was monitored on telemetry and had persistent first degree AV block w/ episodes of asymptomatic bradycardia (HR 40s-50s) as well as intermittent second degree type 1 AV block and intermittent sinus tachycardia. Cardiology feels as though patient can be discharged with 30 day heart monitor w/ close follow up (will get this monitor placed in clinic 03/09/13). No pacer placement this admission. Patient remained orthostatic only by HR even after fluid resuscitation. She will need close f/u with PCP within 1-1.5weeks- I sent a message to [facility] front desk for this to be scheduled as today is holiday. Patient's ASA, statin were continued. She was monitored on continuous pulse ox, with good O2 saturation on r/a. I made sure that prior medications including amlodipine, lasix, lisinopril, spironolactone that patient was still taking despite being instructed to discontinue have been discontinued and patient and family were counseled multiple times regarding this issue. Her home hydralazine was also held this admission and at discharge given she was orthostatic by HR and her BP was relatively stable. This  may need to be restarted at her f/u appointment if her BP are elevated again. Pt has follow up with Dr. Burt Knack on 2/9. Pt has [facility] already set up, so this was continued at discharge.  Seizure disorder: Stable, no seizure like activity during this admission. We continued phenobarbital 97.2mg  + 60mg  qHS and zonisamide 200mg  qHS during admission and  at discharge. Of note, patient had been taking only 97.2mg  of the phenobarb at the time of admission. I believe this was a misunderstanding between her and her neurologist (at [facility]) given per chart review, patient had elevated phenobarb levels back in Oct 2014 and was called and told to decrease her dose from 180mg  daily to 97.2mg  + 60mg  qHS. Patient somehow started taking only the 97.2mg , therefore on admission her phenobarbital level was low-normal. I do not think these episodes represented seizures, though patient did note having some L sided shaking during each episode prior to admission.  HTN: BP was relatively stable and remained somewhat wide ranging: 103-175/60s. She is supposed to be on only hydralazine 50mg  TID at home, though had been taking lasix, spironolactone, lisinopril, amlodipine at the time of her admission. All of these medications had been stopped during her admission a few weeks ago for symptomatic bradycardia. Patient received IVF x 2 days and remained orthostatic only by HR at time of discharge. She did not complain of dizziness while standing, however. She was counseled to discontinue hydralazine, lasix, spironolactone, lisinopril, and amlodipine. May need to add back hydralazine as BP allows.  Acute on CKD stage 3-Cr was 2.34 on admission and is 1.90 today. Likely prerenal given BUN/Cr >20 along with orthostatic hypotension. May want to recheck BMP at her f/u visit.  Trichomonas- found on UA. She received flagyl 2g x 1 dose. I counseled her that her husband or any sexual partner will also need to be treated. She was quite confused given she  stated to me that she has not had sex for >17 years.    Discharge Vitals:   BP 124/65  Pulse 70  Temp(Src) 98.1 F (36.7 C) (Oral)  Resp 20  Ht 5\' 1"  (1.549 m)  Wt 206 lb 11.2 oz (93.759 kg)  BMI 39.08 kg/m2  SpO2 100%  Discharge Labs:  Results for orders placed during the hospital encounter of 03/05/13 (from the past 24 hour(s))  BASIC METABOLIC PANEL     Status: Abnormal   Collection Time    03/08/13  3:33 AM      Result Value Range   Sodium 138  137 - 147 mEq/L   Potassium 4.5  3.7 - 5.3 mEq/L   Chloride 105  96 - 112 mEq/L   CO2 19  19 - 32 mEq/L   Glucose, Bld 86  70 - 99 mg/dL   BUN 51 (*) 6 - 23 mg/dL   Creatinine, Ser 1.90 (*) 0.50 - 1.10 mg/dL   Calcium 9.2  8.4 - 10.5 mg/dL   GFR calc non Af Amer 26 (*) >90 mL/min   GFR calc Af Amer 31 (*) >90 mL/min  GLUCOSE, CAPILLARY     Status: None   Collection Time    03/08/13  6:24 AM      Result Value Range   Glucose-Capillary 90  70 - 99 mg/dL   Comment 1 Notify RN     Comment 2 Documented in Chart      Signed: Rebecca Eaton, MD 03/08/2013, 3:05 PM   Time Spent on Discharge: 35 minutes Services Ordered on Discharge: continued [facility] (had this already prior to admission) Equipment Ordered on Discharge: none

## 2013-03-08 NOTE — Progress Notes (Signed)
Reviewed Heart Failure Booklet, including the Zone tool, medications, fluid restrictions, weighing self daily, and 2s/s for when to call the doctor.  Stated understanding of all.  Has electronic scales at home.

## 2013-03-08 NOTE — Progress Notes (Signed)
UR completed Mandalyn Pasqua K. Becci Batty, RN, BSN, [place], CCM  03/08/2013 4:01 PM

## 2013-03-08 NOTE — Progress Notes (Signed)
Discharged for home via wheelchair escort to vehicle transport by daughter and son-in-law.  No voiced complaints

## 2013-03-08 NOTE — Plan of Care (Signed)
Problem: Discharge Progression Outcomes Goal: Discharge plan in place and appropriate Outcome: Completed/Met Date Met:  03/08/13 Discharging to home with spouse.f/u appts reviewed for Dr. Caryl Comes tomorrow @ 12:45, Dr. Nicoletta Dress in 4 days, and Dr. Burt Knack 03/29/13 @ 3pm

## 2013-03-08 NOTE — Plan of Care (Signed)
Problem: Discharge Progression Outcomes Goal: Pain controlled with appropriate interventions Outcome: Not Applicable Date Met:  34/91/79 Denies pain

## 2013-03-08 NOTE — Discharge Instructions (Signed)
Please make sure you stand up SLOWLY to prevent lightheadedness. Please STOP taking your hydralazine, lisinopril, spironolactone, metoprolol, amlodipine.  Please go to see Dr. Caryl Comes to get your heart monitor placed TOMORROW (03/09/2013) at 12:45pm.You will wear this for 30 days. Please follow up with your cardiologist on 03/29/2013.  Please continue taking your zonegran as previously prescribed. Take Luminal 97.2mg  PLUS 60mg  (TOGETHER) daily to prevent seizure recurrence.   Cardiac Arrhythmia Your heart is a muscle that works to pump blood through your body by regular contractions. The beating of your heart is controlled by a system of special pacemaker cells. These cells control the electrical activity of the heart. When the system controlling this regular beating is disturbed, a heart rhythm abnormality (arrhythmia) results. WHEN YOUR HEART SKIPS A BEAT One of the most common and least serious heart arrhythmias is called an ectopic or premature atrial heartbeat (PAC). This may be noticed as a small change in your regular pulse. A PAC originates from the top part (atrium) of the heart. Within the right atrium, the SA node is the area that normally controls the regularity of the heart. PACs occur in heart tissue outside of the SA node region. You may feel this as a skipped beat or heart flutter, especially if several occur in succession or occur frequently.  Another arrhythmia is ventricular premature complex (VCP or PVC). These extra beats start out in the bottom, more muscular chambers of the heart. In most cases a PVC is harmless. If there are underlying causes that are making the heart irritable such as an overactive thyroid or a prior heart attack PVCs may be of more concern. In a few cases, medications to control the heart rhythm may be prescribed. Things to try at home:  Cut down or avoid alcohol, tobacco and caffeine.  Get enough sleep.  Reduce stress.  Exercise more. WHEN THE HEART BEATS  TOO FAST Atrial tachycardia is a fast heart rate, which starts out in the atrium. It may last from minutes to much longer. Your heart may beat 140 to 240 times per minute instead of the normal 60 to 100.  Symptoms include a worried feeling (anxiety) and a sense that your heart is beating fast and hard.  You may be able to stop the fast rate by holding your breath or bearing down as if you were going to have a bowel movement.  This type of fast rate is usually not dangerous. Atrial fibrillation and atrial flutter are other fast rhythms that start in the atria. Both conditions keep the atria from filling with enough blood so the heart does not work well.  Symptoms include feeling light-headed or faint.  These fast rates may be the result of heart damage or disease. Too much thyroid hormone may play a role.  There may be no clear cause or it may be from heart disease or damage.  Medication or a special electrical treatment (cardioversion) may be needed to get the heart beating normally. Ventricular tachycardia is a fast heart rate that starts in the lower muscular chambers (ventricles) This is a serious disorder that requires treatment as soon as possible. You need someone else to get and use a small defibrillator.  Symptoms include collapse, chest pain, or being short of breath.  Treatment may include medication, procedures to improve blood flow to the heart, or an implantable cardiac defibrillator (ICD). DIAGNOSIS   A cardiogram (EKG or ECG) will be done to see the arrhythmia, as well as lab tests  to check the underlying cause.  If the extra beats or fast rate come and go, you may wear a Holter monitor that records your heart rate for a longer period of time. SEEK MEDICAL CARE IF:  You have irregular or fast heartbeats (palpitations).  You experience skipped beats.  You develop lightheadedness.  You have chest discomfort.  You have shortness of breath.  You have more frequent  episodes, if you are already being treated. SEEK IMMEDIATE MEDICAL CARE IF:   You have severe chest pain, especially if the pain is crushing or pressure-like and spreads to the arms, back, neck, or jaw, or if you have sweating, feeling sick to your stomach (nausea), or shortness of breath. THIS IS AN EMERGENCY. Do not wait to see if the pain will go away. Get medical help at once. Call 911 or 0 (operator). DO NOT drive yourself to the hospital.  You feel dizzy or faint.  You have episodes of previously documented atrial tachycardia that do not resolve with the techniques your caregiver has taught you.  Irregular or rapid heartbeats begin to occur more often than in the past, especially if they are associated with more pronounced symptoms or of longer duration. Document Released: 02/04/2005 Document Revised: 04/29/2011 Document Reviewed: 09/23/2007 [facility] Patient Information 2014 [place]. Near-Syncope Near-syncope (commonly known as near fainting) is sudden weakness, dizziness, or feeling like you might pass out. During an episode of near-syncope, you may also develop pale skin, have tunnel vision, or feel sick to your stomach (nauseous). Near-syncope may occur when getting up after sitting or while standing for a long time. It is caused by a sudden decrease in blood flow to the brain. This decrease can result from various causes or triggers, most of which are not serious. However, because near-syncope can sometimes be a sign of something serious, a medical evaluation is required. The specific cause is often not determined. HOME CARE INSTRUCTIONS  Monitor your condition for any changes. The following actions may help to alleviate any discomfort you are experiencing:  Have someone stay with you until you feel stable.  Lie down right away if you start feeling like you might faint. Breathe deeply and steadily. Wait until all the symptoms have passed. Most of these episodes last only a few  minutes. You may feel tired for several hours.   Drink enough fluids to keep your urine clear or pale yellow.   If you are taking blood pressure or heart medicine, get up slowly when seated or lying down. Take several minutes to sit and then stand. This can reduce dizziness.  Follow up with your health care provider as directed. SEEK IMMEDIATE MEDICAL CARE IF:   You have a severe headache.   You have unusual pain in the chest, abdomen, or back.   You are bleeding from the mouth or rectum, or you have black or tarry stool.   You have an irregular or very fast heartbeat.   You have repeated fainting or have seizure-like jerking during an episode.   You faint when sitting or lying down.   You have confusion.   You have difficulty walking.   You have severe weakness.   You have vision problems.  MAKE SURE YOU:   Understand these instructions.  Will watch your condition.  Will get help right away if you are not doing well or get worse. Document Released: 02/04/2005 Document Revised: 10/07/2012 Document Reviewed: 07/10/2012 [facility] Patient Information 2014 [place].

## 2013-03-08 NOTE — Progress Notes (Signed)
Internal Medicine Attending  Date: 03/08/2013  Patient name: Sherry [name] Medical record number: [id number] Date of birth: 1946/01/16 Age: 68 y.o. Gender: female  I saw and evaluated the patient. I reviewed the resident's note by Dr. Mechele Claude and I agree with the resident's findings and plans as documented in her note, with the following additional comments.  Cardiology has recommended discharge home today with a 30 day event monitor.

## 2013-03-08 NOTE — Care Management Note (Addendum)
  Page 1 of 1   03/08/2013     2:21:01 PM   CARE MANAGEMENT NOTE 03/08/2013  Patient:  Sherry [name], Sherry [name]   Account Number:  [id number]  Date Initiated:  03/08/2013  Documentation initiated by:  Mariann Laster  Subjective/Objective Assessment:   Admintted with heart block     Action/Plan:   CM Consult   Anticipated DC Date:  03/09/2013   Anticipated DC Plan:  [place]  CM consult      Choice offered to / List presented to:             Status of service:  Completed, signed off Medicare Important Message given?   (If response is "NO", the following Medicare IM given date fields will be blank) Date Medicare IM given:   Date Additional Medicare IM given:    Discharge Disposition:    Per UR Regulation:    If discussed at Long Length of Stay Meetings, dates discussed:    Comments:  03/08/2013 Disposition:  D/c home with HHS:  PT/OT (Resume HHS). Patient unable to give name of provider. CM has contacted [place], [place], [facility] and [facility] but shows no active service. Patient states she was just recertified and will notify her agency when she gets home and has the contact information. CM provided patient with HHS list/[place] to assist as needed. CM advised that ordering MD/ PCP can asssit with continued OP HHS if needed. No other needs identified. Halayna Blane RN, BSN, [place], CCM 03/08/2013

## 2013-03-08 NOTE — Progress Notes (Addendum)
   EP Follow-up Note (initial evaluation last admission)  SUBJECTIVE:  No symptoms of chest pain SOB, dizziness, or presyncope.   Tele with Mobitz I, first degree AV block, 2:1 block at times.   PHYSICAL EXAM Filed Vitals:   03/07/13 1856 03/07/13 2057 03/08/13 0502 03/08/13 0900  BP: 121/59 103/53 140/77 124/65  Pulse: 71 65 70 70  Temp:  97.4 F (36.3 C) 98.1 F (36.7 C) 98.1 F (36.7 C)  TempSrc:  Oral Oral Oral  Resp: 18 18 20 20   Height:      Weight:   206 lb 11.2 oz (93.759 kg)   SpO2: 99% 98% 98% 100%   General:  No distress, alert HEENTL OP clear Lungs:  Clear Heart:  RRR, 2/6 SEM LUSB which is late peaking Abdomen: Positive bowel sounds, no rebound no guarding Extremities:  Right greater than left edema   LABS: Lab Results  Component Value Date   TROPONINI <0.30 03/06/2013   Results for orders placed during the hospital encounter of 03/05/13 (from the past 24 hour(s))  BASIC METABOLIC PANEL     Status: Abnormal   Collection Time    03/08/13  3:33 AM      Result Value Range   Sodium 138  137 - 147 mEq/L   Potassium 4.5  3.7 - 5.3 mEq/L   Chloride 105  96 - 112 mEq/L   CO2 19  19 - 32 mEq/L   Glucose, Bld 86  70 - 99 mg/dL   BUN 51 (*) 6 - 23 mg/dL   Creatinine, Ser 1.90 (*) 0.50 - 1.10 mg/dL   Calcium 9.2  8.4 - 10.5 mg/dL   GFR calc non Af Amer 26 (*) >90 mL/min   GFR calc Af Amer 31 (*) >90 mL/min  GLUCOSE, CAPILLARY     Status: None   Collection Time    03/08/13  6:24 AM      Result Value Range   Glucose-Capillary 90  70 - 99 mg/dL   Comment 1 Notify RN     Comment 2 Documented in Chart      Intake/Output Summary (Last 24 hours) at 03/08/13 1107 Last data filed at 03/08/13 0900  Gross per 24 hour  Intake    683 ml  Output    700 ml  Net    -17 ml    ASSESSMENT AND PLAN:  PRESYNCOPE:   Stable mobitz I second degree AV block.  I agree with Dr Percival Spanish that we are not certain that this is the cause for her symptoms.  Severe AS is also  possible cause.  Dr Burt Knack has been following her AS as an outpatient.  Dr Caryl Comes has advised against pacing for mobitz I second degree AV block. At time point, I think it would be reasonable to discharge with a 30 day event monitor.  She has scheduled follow-up wih Dr Burt Knack 03/29/13.    Call with questions   Thompson Grayer 03/08/2013 11:07 AM

## 2013-03-09 ENCOUNTER — Encounter (INDEPENDENT_AMBULATORY_CARE_PROVIDER_SITE_OTHER): Payer: Medicare Other

## 2013-03-09 ENCOUNTER — Telehealth: Payer: Self-pay | Admitting: *Deleted

## 2013-03-09 ENCOUNTER — Encounter: Payer: Self-pay | Admitting: *Deleted

## 2013-03-09 ENCOUNTER — Other Ambulatory Visit: Payer: Self-pay | Admitting: *Deleted

## 2013-03-09 DIAGNOSIS — I440 Atrioventricular block, first degree: Secondary | ICD-10-CM

## 2013-03-09 DIAGNOSIS — I441 Atrioventricular block, second degree: Secondary | ICD-10-CM

## 2013-03-09 DIAGNOSIS — R55 Syncope and collapse: Secondary | ICD-10-CM

## 2013-03-09 NOTE — Telephone Encounter (Signed)
Call from Carl[place] with [facility] - [phone]  Pt was d/c from hospital on 1/19 and [facility] request resumption of orders for Nursing. Pt was being seen for Heart Block/cardiac care. Will this be okay with you?

## 2013-03-09 NOTE — Progress Notes (Signed)
Patient ID: Sherry [name], female   DOB: 1945/11/04, 68 y.o.   MRN: [id number] E-Cardio Verite 30 day cardiac event monitor applied to patient.

## 2013-03-10 ENCOUNTER — Other Ambulatory Visit: Payer: Self-pay | Admitting: Internal Medicine

## 2013-03-10 ENCOUNTER — Telehealth: Payer: Self-pay | Admitting: Internal Medicine

## 2013-03-10 NOTE — Progress Notes (Signed)
Case discussed with Dr. Burnard Bunting at the time of the visit.  We reviewed the resident's history and exam and pertinent patient test results.  I agree with the assessment, diagnosis, and plan of care documented in the resident's note.   Agree with plan for admission to observation given symptoms.

## 2013-03-10 NOTE — Telephone Encounter (Signed)
[facility] informed 

## 2013-03-10 NOTE — Telephone Encounter (Signed)
Yes, Thanks  Dr. Nicoletta Dress

## 2013-03-10 NOTE — Telephone Encounter (Signed)
  INTERNAL MEDICINE RESIDENCY PROGRAM After-Hours Telephone Call    Reason for call:   I placed an outgoing call to Sherry [name] at 1200 noon regarding updates on her DC medications as following. I went over every one of her medication and her Pharmacy updated as following  Discharge Medications:    Medication List     STOP taking these medications       amLODipine 10 MG tablet    Commonly known as: NORVASC    furosemide 40 MG tablet    Commonly known as: LASIX    hydrALAZINE 50 MG tablet    Commonly known as: APRESOLINE    lisinopril 40 MG tablet    Commonly known as: PRINIVIL,ZESTRIL    spironolactone 50 MG tablet    Commonly known as: ALDACTONE     TAKE these medications       aspirin 81 MG EC tablet    Take 81 mg by mouth daily.    CALCIUM 500 +D 500-400 MG-UNIT Tabs    Generic drug: Calcium Carb-Cholecalciferol    Take 1 tablet by mouth 2 (two) times daily.    OVER THE COUNTER MEDICATION    Place 1 application into the nose at bedtime. Nasal product    PHENobarbital 97.2 MG tablet    Commonly known as: LUMINAL    Take 97.2 mg by mouth. Take with 60mg  tab    PHENObarbital 60 MG tablet    Commonly known as: LUMINAL    Take 1 tablet (60 mg total) by mouth at bedtime. Take with 97.2mg  tab    pravastatin 40 MG tablet    Commonly known as: PRAVACHOL    Take 40 mg by mouth daily.    Vitamin D (Ergocalciferol) 50000 UNITS Caps capsule    Commonly known as: DRISDOL    Take 50,000 Units by mouth every 7 (seven) days. On Friday    zonisamide 100 MG capsule    Commonly known as: ZONEGRAN    Take 200 mg by mouth at bedtime.        Charlann Lange, MD   03/10/2013, 12:09 PM

## 2013-03-11 ENCOUNTER — Telehealth: Payer: Self-pay | Admitting: Nurse Practitioner

## 2013-03-11 DIAGNOSIS — I5032 Chronic diastolic (congestive) heart failure: Secondary | ICD-10-CM

## 2013-03-11 DIAGNOSIS — I509 Heart failure, unspecified: Secondary | ICD-10-CM

## 2013-03-11 NOTE — Telephone Encounter (Signed)
Spoke with Sherry Derrick, RN  Case Manager with [facility] who called to inform Dr. Burt Knack that the patient has experienced a 9-10 lb weight gain in the last few days per the scales they placed in the patient's home.  Sherry [name] spoke with the patient who states she was d/c'ed from the hospital on 1/19 and all of her diuretics were stopped.  Patient denies complaints.  Patient states she is concerned about gaining fluid again and having to go back into the hospital but is not currently experiencing any edema, SOB, or other complaints.  I advised Sherry [name] that I will send message to Dr. Burt Knack for advice and will contact the patient with his advice.  I advised that I will also call her back to update her on patient's treatment.  Sherry [name] verbalized agreement and understanding.

## 2013-03-11 NOTE — Telephone Encounter (Signed)
Would start back on lasix 80 mg daily (half of her previous dose) and check a BMET in 1 week. thx

## 2013-03-12 ENCOUNTER — Telehealth: Payer: Self-pay | Admitting: Licensed Clinical Social Worker

## 2013-03-12 DIAGNOSIS — I509 Heart failure, unspecified: Secondary | ICD-10-CM

## 2013-03-12 MED ORDER — FUROSEMIDE 80 MG PO TABS: 80.0000 mg | ORAL_TABLET | Freq: Every day | ORAL | Status: DC

## 2013-03-12 NOTE — Addendum Note (Signed)
Addended by: Emmaline Life on: 03/12/2013 10:45 AM   Modules accepted: Orders

## 2013-03-12 NOTE — Telephone Encounter (Signed)
Called Abelino Derrick, RN with [facility] to inform her that Dr. Burt Knack advised patient resume Lasix at 80 mg once daily. Roselyn Reef thanked me for the call.

## 2013-03-12 NOTE — Telephone Encounter (Signed)
Sherry [name] also aware that we are checking BMET on 2/2

## 2013-03-12 NOTE — Telephone Encounter (Signed)
CSW placed call to Ms. Reader to discuss referral to [facility] per Dr. August Saucer request.  Discussed benefits and services provided, pt in agreement with referral.  Consult placed for [facility].

## 2013-03-12 NOTE — Telephone Encounter (Addendum)
Spoke with patient's daughter who is caregiver and advised her to restart Lasix 80 mg QD.  Daughter states she removed all medications from patient's home that were discontinued when discharged from hospital last week.  I advised that I am sending new Rx to patient's pharmacy so they have the medication over the weekend but that if she has 20 mg or 40 mg tablets that patient can take those to equal 80 mg daily.  I scheduled BMET for 2/2 per daughter's request that due to her work schedule appointments have to be on Mondays.  Patient's daughter verbalized agreement and understanding of plan.  I advised her that I would call patient as well to inform her of Dr. Antionette Char advice.   I left a message for patient to call office for instructions on starting medication.

## 2013-03-15 ENCOUNTER — Telehealth: Payer: Self-pay | Admitting: *Deleted

## 2013-03-15 NOTE — Telephone Encounter (Signed)
eval done today since last disch from hosp. They would like to do PT at home for 2x a week for 5 weeks, gave verb approval, are you ok with this?

## 2013-03-15 NOTE — Telephone Encounter (Signed)
Yes, Please.  Thanks  Nicoletta Dress

## 2013-03-22 ENCOUNTER — Other Ambulatory Visit: Payer: Medicare Other

## 2013-03-22 ENCOUNTER — Ambulatory Visit (INDEPENDENT_AMBULATORY_CARE_PROVIDER_SITE_OTHER): Payer: Medicare Other | Admitting: Internal Medicine

## 2013-03-22 VITALS — BP 158/74 | HR 87 | Temp 98.6°F | Ht 61.0 in | Wt 206.4 lb

## 2013-03-22 DIAGNOSIS — I10 Essential (primary) hypertension: Secondary | ICD-10-CM

## 2013-03-22 DIAGNOSIS — I509 Heart failure, unspecified: Secondary | ICD-10-CM

## 2013-03-22 DIAGNOSIS — G40909 Epilepsy, unspecified, not intractable, without status epilepticus: Secondary | ICD-10-CM

## 2013-03-22 DIAGNOSIS — R569 Unspecified convulsions: Secondary | ICD-10-CM

## 2013-03-22 DIAGNOSIS — I5032 Chronic diastolic (congestive) heart failure: Secondary | ICD-10-CM

## 2013-03-22 DIAGNOSIS — R55 Syncope and collapse: Secondary | ICD-10-CM

## 2013-03-22 LAB — BASIC METABOLIC PANEL WITH GFR
BUN: 31 mg/dL — ABNORMAL HIGH (ref 6–23)
CALCIUM: 9.6 mg/dL (ref 8.4–10.5)
CO2: 28 mEq/L (ref 19–32)
CREATININE: 1.85 mg/dL — AB (ref 0.50–1.10)
Chloride: 100 mEq/L (ref 96–112)
GFR, Est African American: 32 mL/min — ABNORMAL LOW
GFR, Est Non African American: 28 mL/min — ABNORMAL LOW
GLUCOSE: 92 mg/dL (ref 70–99)
Potassium: 4 mEq/L (ref 3.5–5.3)
SODIUM: 139 meq/L (ref 135–145)

## 2013-03-22 NOTE — Patient Instructions (Signed)
1. Will check her BMP and phenobarbital level. 2. Will continue her lasix dose as instructed per cardiologist 3. Follow up in one month.  Seizure, Adult A seizure is abnormal electrical activity in the brain. Seizures usually last from 30 seconds to 2 minutes. There are various types of seizures. Before a seizure, you may have a warning sensation (aura) that a seizure is about to occur. An aura may include the following symptoms:   Fear or anxiety.  Nausea.  Feeling like the room is spinning (vertigo).  Vision changes, such as seeing flashing lights or spots. Common symptoms during a seizure include:  A change in attention or behavior (altered mental status).  Convulsions with rhythmic jerking movements.  Drooling.  Rapid eye movements.  Grunting.  Loss of bladder and bowel control.  Bitter taste in the mouth.  Tongue biting. After a seizure, you may feel confused and sleepy. You may also have an injury resulting from convulsions during the seizure. HOME CARE INSTRUCTIONS   If you are given medicines, take them exactly as prescribed by your health care provider.  Keep all follow-up appointments as directed by your health care provider.  Do not swim or drive or engage in risky activity during which a seizure could cause further injury to you or others until your health care provider says it is OK.  Get adequate rest.  Teach friends and family what to do if you have a seizure. They should:  Lay you on the ground to prevent a fall.  Put a cushion under your head.  Loosen any tight clothing around your neck.  Turn you on your side. If vomiting occurs, this helps keep your airway clear.  Stay with you until you recover.  Know whether or not you need emergency care. SEEK IMMEDIATE MEDICAL CARE IF:  The seizure lasts longer than 5 minutes.  The seizure is severe or you do not wake up immediately after the seizure.  You have an altered mental status after the  seizure.  You are having more frequent or worsening seizures. Someone should drive you to the emergency department or call local emergency services (911 in [place] MAKE SURE YOU:  Understand these instructions.  Will watch your condition.  Will get help right away if you are not doing well or get worse. Document Released: 02/02/2000 Document Revised: 11/25/2012 Document Reviewed: 09/16/2012 [facility] Patient Information 2014 [place].

## 2013-03-22 NOTE — Assessment & Plan Note (Addendum)
Assessment She reports no seizure activity since hospital discharge.  She reports medical compliance with her phenobarbital dosage 60 mg +97.2 mg at bedtime.  Zonisamide 200 mg at bedtime is on her medication list, however,  her daughters on impression that this med was discontinued from her neurologist.  Per Care everywhere, cardiologist as recommended to continue to take Zonisamide and phenobarbital.  Plan - Will check phenobarbital today - Will call her neurologist to clarify her medications.    Addendum Phenobarbital level is 40 ( 15-40). I have called and discussed the lab results with the patient, who agrees for me to call her Neurologist. I placed a call to her neurologist Dr. Assunta Found at [facility].

## 2013-03-22 NOTE — Assessment & Plan Note (Signed)
Assessment Patient states that she stay well.  Denies shortness of breath, orthopnea or PND.  Weight is stable and a slightly decrease since hospitalization.  Her Lasix 80 mg by mouth daily was added back to her regimen by cardiologist the cardiologist.  Plan -Will check her BMP today-we have her cardiologist that we will check her BMP (she is actually scheduled check her BMP at her cardiologist office today) -Continue her current medication dosage and followup with her cardiologist in one week per schedule.

## 2013-03-22 NOTE — Progress Notes (Signed)
Patient ID: Sherry [name], female   DOB: [birth year], 68 y.o.   MRN: [id number]    Patient: Sherry [name]   MRN: [id number]  DOB: 1945/08/12  PCP: Charlann Lange, MD   Subjective:    CC: Follow-up   HPI: [name] is a 68 y.o. female with a PMHx of morbid obesity,HTN, CKD stage 3, chronic diastolic HF, CVA, and seizure disorder, who presented to clinic today for the hospital followup.  She was admitted for the evaluation of near syncope between 1/16- 03/08/13. The etiology of her near syncope was thought to be related to severe AS,vs symptomatic AV block vs orthostatics vs seizure. Almost all of her antihypertensive medications were on hold upon discharge including Amlodipine, lasix, hydralazine, lisinorpil and spirolactone. After the hospital discharge, her lasix 80 daily was added back per her cardiologist instruction. She has an appt with him on 03/29/13.   She reports that she is doing well. No c/o dizziness, lightheadedness, falls, syncope, seizures. She reports medicine compliance with all of her medications except for zonisamide. Her daughter states that she is under impression taht this med was dc'd by her neurologist. However, based on her neurologist last [place] in Oct 2014 from care everywhere, this med was recs by her neurologist.    Health maintenance  # Mammogram- daughter needs to schedule.  # Colonoscopy-- patient will need talk to her Neurologist and Cardiologist. Suspect risk is more than benefit given her comorbilities.   Review of Systems: Review of Systems:  Constitutional:  Denies fever, chills, diaphoresis, appetite change and fatigue.   HEENT:  Denies congestion, sore throat, rhinorrhea, sneezing, mouth sores, trouble swallowing, neck pain   Respiratory:  Denies SOB, DOE, cough, and wheezing.   Cardiovascular:  Denies palpitations and leg swelling.   Gastrointestinal:  Denies nausea, vomiting, abdominal pain, diarrhea, constipation, blood in stool and abdominal  distention.   Genitourinary:  Denies dysuria, urgency, frequency, hematuria, flank pain and difficulty urinating.   Musculoskeletal:  Denies myalgias, back pain, joint swelling, arthralgias and gait problem.   Skin:  Denies pallor, rash and wound.   Neurological:  Denies dizziness, seizures, syncope, weakness, light-headedness, numbness and headaches.    .     Current Outpatient Medications: Current Outpatient Prescriptions  Medication Sig Dispense Refill  . aspirin 81 MG EC tablet Take 81 mg by mouth daily.       . Calcium Carb-Cholecalciferol (CALCIUM 500 +D) 500-400 MG-UNIT TABS Take 1 tablet by mouth 2 (two) times daily.      . furosemide (LASIX) 80 MG tablet Take 1 tablet (80 mg total) by mouth daily.  90 tablet  3  . OVER THE COUNTER MEDICATION Place 1 application into the nose at bedtime. Nasal product      . PHENObarbital (LUMINAL) 60 MG tablet Take 1 tablet (60 mg total) by mouth at bedtime. Take with 97.2mg  tab  30 tablet  0  . PHENobarbital (LUMINAL) 97.2 MG tablet Take 97.2 mg by mouth. Take with 60mg  tab      . pravastatin (PRAVACHOL) 40 MG tablet Take 40 mg by mouth daily.      . Vitamin D, Ergocalciferol, (DRISDOL) 50000 UNITS CAPS capsule Take 50,000 Units by mouth every 7 (seven) days. On Friday      . zonisamide (ZONEGRAN) 100 MG capsule Take 200 mg by mouth at bedtime.       No current facility-administered medications for this visit.    Allergies: Allergies  Allergen Reactions  . Ibuprofen Other (See  Comments)    Dr advised pt not to take ibuprofen    Past Medical History  Diagnosis Date  . Aortic stenosis     a. 06/2012 ECHO: severly calcified AV, with with mod AS by Doppler criteria. Sig LVH;  b. 01/2013 Echo: EF 60-65%, mod LVH, Sev AS (1.18cm^2 VTI, 1.35cm^2 Vmax), Severe MAC esp posteriorly, mildly dil LA, PASP 63mmHg.  [name] Hypertension   . Seizure disorder   . Hyperlipidemia   . Chronic diastolic heart failure     a. 06/2012 ECHO: EF Q000111Q, grd 1  distolic dysfxn, severly calcified AV, with with mod AS by Doppler criteria. Sig LVH  . CKD (chronic kidney disease)     a. baseline CKD stage III  . Morbid obesity   . Anemia   . Bilateral lower extremity edema   . Mobitz (type) I (Wenckebach's) atrioventricular block     a. 01/2013 - asymptomatic.  . Bradycardia     a. 01/2013 - asymptomatic.  [name] Coronary artery disease   . Heart murmur   . GERD (gastroesophageal reflux disease)   . Seizures   . Headache(784.0)   . Arthritis   . TIA (transient ischemic attack) 2014    pt stated she had "mini strokes"    Objective:    Physical Exam:  Filed Vitals:   03/22/13 0958  BP: 158/74  Pulse: 87  Temp: 98.6 F (37 C)  TempSrc: Oral  Height: 5\' 1"  (1.549 m)  Weight: 206 lb 6.4 oz (93.622 kg)  SpO2: 100%     General: Vital signs reviewed and noted. Well-developed, well-nourished, in no acute distress; alert, appropriate and cooperative throughout examination.  Head: Normocephalic, atraumatic.  Lungs:  Normal respiratory effort. Clear to auscultation BL without crackles or wheezes.  Heart: RRR. S1 and S2 normal without gallop, rubs. Right 2nd intercostal space 3/6 systolic murmur.  Abdomen:  BS normoactive. Soft, Nondistended, non-tender.  No masses or organomegaly.  Extremities: No pretibial edema. Bilateral stocking on. Right LE dressing.    Assessment/ Plan:

## 2013-03-22 NOTE — Assessment & Plan Note (Addendum)
Assessment: She is here for hospital follow up. States that she is doing well. Denies  dizziness, lightheadedness, falls, syncope, seizures.  Orthostatic VS Lying BP 180/70, HR 60 Sitting BP 160/70. HR 72 Standing BP 160/70. HR 72 No dizziness or lightheadedness  Plan: - will check her orthostatic VS per hospital discharge instruction.    She does have mild orthostatic VS without associated symptoms. I will ask her to continue to take her Lasix dosage instructed by her cardiologist since it was just resumed one week ago. I will ask her to follow up with her cardiologist in one week as scheduled.   -Instruct patient to take all her medications as prescribed. - She has received 30 day event monitor from her cardiology Patient has a followup appointment with her cardiologist in one week.

## 2013-03-23 LAB — PHENOBARBITAL LEVEL: PHENOBARBITAL: 40 ug/mL (ref 15.0–40.0)

## 2013-03-23 NOTE — Progress Notes (Signed)
Case discussed with Dr. Nicoletta Dress  at time of visit.  We reviewed the resident's history and exam and pertinent patient test results.  I agree with the assessment, diagnosis, and plan of care documented in the resident's note.

## 2013-03-29 ENCOUNTER — Ambulatory Visit (INDEPENDENT_AMBULATORY_CARE_PROVIDER_SITE_OTHER): Payer: Medicare Other | Admitting: Cardiovascular Disease

## 2013-03-29 ENCOUNTER — Ambulatory Visit (HOSPITAL_COMMUNITY): Payer: Medicare Other

## 2013-03-29 ENCOUNTER — Telehealth: Payer: Self-pay | Admitting: *Deleted

## 2013-03-29 ENCOUNTER — Encounter: Payer: Self-pay | Admitting: Cardiovascular Disease

## 2013-03-29 VITALS — BP 158/78 | HR 79 | Ht 61.0 in | Wt 196.0 lb

## 2013-03-29 DIAGNOSIS — I359 Nonrheumatic aortic valve disorder, unspecified: Secondary | ICD-10-CM

## 2013-03-29 DIAGNOSIS — I350 Nonrheumatic aortic (valve) stenosis: Secondary | ICD-10-CM

## 2013-03-29 DIAGNOSIS — I5032 Chronic diastolic (congestive) heart failure: Secondary | ICD-10-CM

## 2013-03-29 DIAGNOSIS — E785 Hyperlipidemia, unspecified: Secondary | ICD-10-CM

## 2013-03-29 DIAGNOSIS — I509 Heart failure, unspecified: Secondary | ICD-10-CM

## 2013-03-29 NOTE — Telephone Encounter (Signed)
Call from [facility] with [facility] [phone] Nurse is asking for orders to continue seeing pt  3 times a week for 4 weeks.   Pt has POT monitor on and elevated BP.  Will this be okay with you?

## 2013-03-29 NOTE — Progress Notes (Signed)
HPI:  68 year old woman presenting for follow-up evaluation of aortic stenosis. I last saw her in August 2014. She has been hospitalized several times since that office visit. She was hospitalized in September with cellulitis, December with 2:1 heart block, and again in January 2015 with presyncope. An echocardiogram during her most recent hospital admission show significant progression of her aortic stenosis, now clearly in the severe range. She's also had issues with congestive heart failure and diuretic dosing has had to be adjusted based on renal function as she has significant chronic kidney disease.  The patient is participating in physical therapy at home. She is able to transfer on her own. She has had limited physical mobility for several years and this is due to leg weakness and osteoarthritis primarily. She lives with her husband and has a reasonably good quality of life. She has shortness of breath with activity, but this is essentially unchanged over the past few months. She has mild chronic leg swelling. She denies orthopnea or PND. She's had no recent chest pain or pressure. No episodes of pre-syncope since her hospital discharge. She is currently wearing an event monitor to evaluate for bradyarrhythmias.  Outpatient Encounter Prescriptions as of 03/29/2013  Medication Sig  . aspirin 81 MG EC tablet Take 81 mg by mouth daily.   . Calcium Carb-Cholecalciferol (CALCIUM 500 +D) 500-400 MG-UNIT TABS Take 1 tablet by mouth 2 (two) times daily.  . furosemide (LASIX) 80 MG tablet Take 1 tablet (80 mg total) by mouth daily.  [name] OVER THE COUNTER MEDICATION Place 1 application into the nose at bedtime. Nasal product  . PHENObarbital (LUMINAL) 60 MG tablet Take 1 tablet (60 mg total) by mouth at bedtime. Take with 97.2mg  tab  . PHENobarbital (LUMINAL) 97.2 MG tablet Take 97.2 mg by mouth. Take with 60mg  tab  . pravastatin (PRAVACHOL) 40 MG tablet Take 40 mg by mouth daily.  . Vitamin D,  Ergocalciferol, (DRISDOL) 50000 UNITS CAPS capsule Take 50,000 Units by mouth every 7 (seven) days. On Friday  . zonisamide (ZONEGRAN) 100 MG capsule Take 200 mg by mouth at bedtime.    Allergies  Allergen Reactions  . Ibuprofen Other (See Comments)    Dr advised pt not to take ibuprofen    Past Medical History  Diagnosis Date  . Aortic stenosis     a. 06/2012 ECHO: severly calcified AV, with with mod AS by Doppler criteria. Sig LVH;  b. 01/2013 Echo: EF 60-65%, mod LVH, Sev AS (1.18cm^2 VTI, 1.35cm^2 Vmax), Severe MAC esp posteriorly, mildly dil LA, PASP 75mmHg.  [name] Hypertension   . Seizure disorder   . Hyperlipidemia   . Chronic diastolic heart failure     a. 06/2012 ECHO: EF Q000111Q, grd 1 distolic dysfxn, severly calcified AV, with with mod AS by Doppler criteria. Sig LVH  . CKD (chronic kidney disease)     a. baseline CKD stage III  . Morbid obesity   . Anemia   . Bilateral lower extremity edema   . Mobitz (type) I (Wenckebach's) atrioventricular block     a. 01/2013 - asymptomatic.  . Bradycardia     a. 01/2013 - asymptomatic.  [name] Coronary artery disease   . Heart murmur   . GERD (gastroesophageal reflux disease)   . Seizures   . Headache(784.0)   . Arthritis   . TIA (transient ischemic attack) 2014    pt stated she had "mini strokes"    ROS: Negative except as per HPI  BP  158/78  Pulse 79  Ht 5\' 1"  (1.549 m)  Wt 196 lb (88.905 kg)  BMI 37.05 kg/m2  PHYSICAL EXAM: Pt is alert and oriented, pleasant African American woman in a wheelchair, in NAD HEENT: normal Neck: JVP - normal, carotids delayed bilaterally Lungs: CTA bilaterally CV: RRR with grade 3/6 harsh late peaking systolic murmur at the carina upper sternal border Abd: soft, NT, Positive BS, no hepatomegaly Ext: Trace bilateral pretibial edema, distal pulses intact and equal Skin: warm/dry no rash  EKG: Normal sinus rhythm 79 beats per minute, first degree AV block, left ventricular hypertrophy with  repolarization abnormality, cannot rule out septal infarction age undetermined.  2D Echo: Left ventricle: The cavity size was mildly dilated. Wall thickness was increased in a pattern of moderate LVH. Systolic function was normal. The estimated ejection fraction was in the range of 60% to 65%.  ------------------------------------------------------------ Aortic valve: Severely calcified leaflets. Doppler: There was severe stenosis. VTI ratio of LVOT to aortic valve: 0.37. Valve area: 1.18cm^2(VTI). Indexed valve area: 0.57cm^2/m^2 (VTI). Peak velocity ratio of LVOT to aortic valve: 0.43. Valve area: 1.35cm^2 (Vmax). Indexed valve area: 0.65cm^2/m^2 (Vmax). Mean gradient: 24mm Hg (S). Peak gradient: 7mm Hg (S).  ------------------------------------------------------------ Mitral valve: Severe MAC especially posteriorly Doppler: Trivial regurgitation. Peak gradient: 71mm Hg (D).  ------------------------------------------------------------ Left atrium: The atrium was mildly dilated.  ------------------------------------------------------------ Atrial septum: No defect or patent foramen ovale was identified.  ------------------------------------------------------------ Right ventricle: The cavity size was normal. Wall thickness was normal. Systolic function was normal.  ------------------------------------------------------------ Pulmonic valve: Structurally normal valve. Cusp separation was normal. Doppler: Transvalvular velocity was within the normal range. Trivial regurgitation.  ------------------------------------------------------------ Tricuspid valve: Structurally normal valve. Leaflet separation was normal. Doppler: Transvalvular velocity was within the normal range. Mild regurgitation.  ------------------------------------------------------------ Right atrium: The atrium was normal in  size.  ------------------------------------------------------------ Pericardium: A trivial pericardial effusion was identified.  RISK SCORES Procedure: AV Replacement Risk of Mortality: 2.126% Morbidity or Mortality: 25.247% Long Length of Stay: 11.695% Short Length of Stay: 22.146% Permanent Stroke: 3.182% Prolonged Ventilation: 17.452% DSW Infection: 0.182% Renal Failure: 7.269% Reoperation: 8.96%   ASSESSMENT AND PLAN: 1. Severe aortic stenosis, symptomatic 2. Chronic diastolic heart failure 3. Mobitz 1 and 2:1 AV block now off of all AV nodal blocking agents 4. Hypertension 5. Chronic kidney disease, stage IV 6. History of stroke  With symptoms of presyncope and heart failure, I think it is fairly clear she has developed severe symptomatic aortic stenosis. When I saw her last, her transaortic valve gradients were in the moderate range but there has been significant progression since that time. Her exam is consistent with severe AS. We discussed natural history, limitations of medical therapy, and treatment options at length. The patient is here with her daughter today. She understands the poor prognosis associated with severe aortic stenosis. I do not think she is a candidate for surgical aortic valve replacement considering her multiple comorbid medical conditions which include prior stroke, seizure disorder, chronic diastolic heart failure, stage IV CKD, and most importantly the fact that she is nonambulatory. Despite her inability to walk, she is able to care for herself, transfer, and live independently with her husband.  I think her nonambulatory status makes her operative risk much higher than that predicted by the STS risk calculator. However, she has not been formally evaluated by cardiac surgery and this should be the next step in evaluation of her aortic stenosis. We discussed the possibility of TAVR as a treatment alternative. She is interested  in pursuing this, but would like  a few weeks to think about things. She understands that she would require several contrast studies and that there is some risk related to her chronic kidney disease. She would like to see one of the cardiac surgeons next month. For now will continue her current medical program. She understands to call if shortness of breath worsens or symptoms of chest pain/presyncope occur. Will review her event monitor as soon as a result is available. Will keep her off of all AV nodal blockers.  Sherren Mocha 03/29/2013 3:40 PM

## 2013-03-29 NOTE — Telephone Encounter (Signed)
Sure. Sherry [name]

## 2013-03-29 NOTE — Patient Instructions (Signed)
Your physician recommends that you follow up on Monday, April 27 at 3:00 pm.  Your physician recommends that you continue on your current medications as directed. Please refer to the Current Medication list given to you today.

## 2013-03-29 NOTE — Telephone Encounter (Signed)
Order given to Dawn,HHN

## 2013-04-12 ENCOUNTER — Encounter: Payer: Self-pay | Admitting: Internal Medicine

## 2013-04-12 ENCOUNTER — Ambulatory Visit: Payer: Medicare Other | Admitting: Internal Medicine

## 2013-04-12 ENCOUNTER — Ambulatory Visit (INDEPENDENT_AMBULATORY_CARE_PROVIDER_SITE_OTHER): Payer: Medicare Other | Admitting: Internal Medicine

## 2013-04-12 VITALS — BP 180/80 | HR 62 | Temp 98.4°F | Wt 201.9 lb

## 2013-04-12 DIAGNOSIS — I509 Heart failure, unspecified: Secondary | ICD-10-CM

## 2013-04-12 DIAGNOSIS — I359 Nonrheumatic aortic valve disorder, unspecified: Secondary | ICD-10-CM

## 2013-04-12 DIAGNOSIS — G40909 Epilepsy, unspecified, not intractable, without status epilepticus: Secondary | ICD-10-CM

## 2013-04-12 DIAGNOSIS — I350 Nonrheumatic aortic (valve) stenosis: Secondary | ICD-10-CM

## 2013-04-12 DIAGNOSIS — I10 Essential (primary) hypertension: Secondary | ICD-10-CM

## 2013-04-12 DIAGNOSIS — I5032 Chronic diastolic (congestive) heart failure: Secondary | ICD-10-CM

## 2013-04-12 DIAGNOSIS — R569 Unspecified convulsions: Secondary | ICD-10-CM

## 2013-04-12 NOTE — Patient Instructions (Signed)
1. You have done great job in taking all your medications. I appreciate it very much. Please continue doing that. 2. Please take all medications as prescribed and come back in two month.  3. If you have worsening of your symptoms or new symptoms arise, please call the clinic [phone] or go to the ER immediately if symptoms are severe.  Please bring in all your medication bottles with you in next visit.

## 2013-04-12 NOTE — Progress Notes (Signed)
Patient ID: Sherry [name], female   DOB: 05-Jan-1946, 68 y.o.   MRN: [id number] Subjective:   Patient ID: Sherry [name] female   DOB: 22-Dec-1945 68 y.o.   MRN: [id number]  CC:   Follow up visit.   HPI:  [name] is a 68 y.o. lady with past medical history as outlined below, who presents for a followup visit today  1. AS:  Patient has been followed up by Dr. Burt [name]. The patient was seen by Dr. Burt [name] on 03/29/13. Per Dr. Antionette [name] note, patient is not a good candidate for AVRT, but she was offered to get evaluation by vascular surgeon. Patient has an appointment with Dr. Roxy [name] on 3//16. Because of bradycardia and AV block, all her AV blockers were discontinued. The heart rate is 62 today. Reports that she had one minor feeling of presyncope in the last Sunday, but no fall or loss of consciousness. It lasted for a few seconds and resolved spontaneously. Today generally, she feels good. She has mild shortness of breath which is at her baseline. She does not have chest pain. Her leg edema improved significantly. Today she does not have leg edema.  2. HTN: The patient is only on Lasix 80 mg daily which is also for congestive heart failure. Her blood pressure was found to be 189/87 initially when she came in to the clinic. The repeated blood pressure measurements showed: 180/80 mmHg in L arm and 160/80 mmHg in right arm. She does not have any chest pain, no weakness in her right arm on exertion.   4. CHF: Patient is currently taking Lasix 80 mg daily and aspirin 81 mg daily. Her body weight decreased by 5 pounds (206 on 03/22/13-->201 LBs today). She feels that he is breathing better. She does not have any leg edema. Her shortness of breath is at her baseline.  ROS: has mild shortness of breath and generalized weakness. Denies fever, chills, headaches, cough, chest pain, abdominal pain, diarrhea, constipation, dysuria, urgency, frequency, hematuria or leg swelling.  Past Medical History   Diagnosis Date  . Aortic stenosis     a. 06/2012 ECHO: severly calcified AV, with with mod AS by Doppler criteria. Sig LVH;  b. 01/2013 Echo: EF 60-65%, mod LVH, Sev AS (1.18cm^2 VTI, 1.35cm^2 Vmax), Severe MAC esp posteriorly, mildly dil LA, PASP 57mmHg.  [name] Hypertension   . Seizure disorder   . Hyperlipidemia   . Chronic diastolic heart failure     a. 06/2012 ECHO: EF Q000111Q, grd 1 distolic dysfxn, severly calcified AV, with with mod AS by Doppler criteria. Sig LVH  . CKD (chronic kidney disease)     a. baseline CKD stage III  . Morbid obesity   . Anemia   . Bilateral lower extremity edema   . Mobitz (type) I (Wenckebach's) atrioventricular block     a. 01/2013 - asymptomatic.  . Bradycardia     a. 01/2013 - asymptomatic.  [name] Coronary artery disease   . Heart murmur   . GERD (gastroesophageal reflux disease)   . Seizures   . Headache(784.0)   . Arthritis   . TIA (transient ischemic attack) 2014    pt stated she had "mini strokes"   Current Outpatient Prescriptions  Medication Sig Dispense Refill  . aspirin 81 MG EC tablet Take 81 mg by mouth daily.       . Calcium Carb-Cholecalciferol (CALCIUM 500 +D) 500-400 MG-UNIT TABS Take 1 tablet by mouth 2 (two) times daily.      [name] PHENObarbital (  LUMINAL) 60 MG tablet Take 1 tablet (60 mg total) by mouth at bedtime. Take with 97.2mg  tab  30 tablet  0  . PHENobarbital (LUMINAL) 97.2 MG tablet Take 97.2 mg by mouth. Take with 60mg  tab      . pravastatin (PRAVACHOL) 40 MG tablet Take 40 mg by mouth daily.      . Vitamin D, Ergocalciferol, (DRISDOL) 50000 UNITS CAPS capsule Take 50,000 Units by mouth every 7 (seven) days. On Friday      . zonisamide (ZONEGRAN) 100 MG capsule Take 200 mg by mouth at bedtime.      . furosemide (LASIX) 80 MG tablet Take 1 tablet (80 mg total) by mouth daily.  90 tablet  3  . OVER THE COUNTER MEDICATION Place 1 application into the nose at bedtime. Nasal product       No current facility-administered medications  for this visit.   No family history on file. History   Social History  . Marital Status: Married    Spouse Name: N/A    Number of Children: N/A  . Years of Education: N/A   Social History Main Topics  . Smoking status: Former Smoker    Types: Cigarettes    Quit date: 06/03/2000  . Smokeless tobacco: None  . Alcohol Use: No  . Drug Use: No  . Sexual Activity: Not Currently   Other Topics Concern  . None   Social History Narrative  . None    Review of Systems: Full 14-point review of systems otherwise negative. See HPI.   Objective:  Physical Exam: Filed Vitals:   04/12/13 1009 04/12/13 1041 04/12/13 1135  BP: 189/87 160/80 180/80  Pulse: 62    Temp: 98.4 F (36.9 C)    TempSrc: Oral    Weight: 201 lb 14.4 oz (91.581 kg)    SpO2: 99%     Constitutional: Vital signs reviewed.  Patient is a well-developed and well-nourished, in no acute distress and cooperative with exam.   HEENT:  Head: Normocephalic and atraumatic Eyes: PERRL, EOMI, conjunctivae normal, No scleral icterus.  Neck: Supple, Trachea midline normal ROM, No JVD  Cardiovascular: RRR, S1 normal, S2 normal, Right 2nd intercostal space 3/6 systolic murmur.  Pulmonary/Chest: CTAB, no wheezes, rales, or rhonchi Abdominal: Soft. Non-tender, non-distended, bowel sounds are normal, no masses, organomegaly, or guarding present.  GU: no CVA tenderness Musculoskeletal: No joint deformities, erythema, or stiffness, ROM full and non-tender Extremities: No leg edema. Bilateral stocking on. Hematology: no cervical, inginal, or axillary adenopathy.  Neurological: A&O x3, Strength is normal and symmetric bilaterally, cranial nerve II-XII are grossly intact, no focal motor deficit, sensory intact to light touch bilaterally.  Skin: Warm, dry and intact. No rash, cyanosis, or clubbing.  Psychiatric: Normal mood and affect. No suicidal or homicidal ideation.  Assessment & Plan:

## 2013-04-12 NOTE — Assessment & Plan Note (Signed)
BP Readings from Last 3 Encounters:  04/12/13 180/80  03/29/13 158/78  03/22/13 158/74    Lab Results  Component Value Date   NA 139 03/22/2013   K 4.0 03/22/2013   CREATININE 1.85* 03/22/2013    Assessment: Blood pressure control: mildly elevated Progress toward BP goal:  deteriorated Comments:   Plan: Medications:  continue current medications Educational resources provided: brochure Self management tools provided:   Other plans: There is discrepancy of 20 mmHg in her systolic blood pressure between two arms. Not very sure about the etiology. She does not have any chest pain, making the aortic dissection unlikely. Possibly due to atherosclerosis. She is asymptomatic for this difference of blood pressure in two arms.  Patient does not have weakness on exertion in her right arm.  Will need to follow up in future visit by measuring bp in both arms and asking for any symptoms. Will continue current regimen today.

## 2013-04-12 NOTE — Assessment & Plan Note (Signed)
2-D echo on 2/30/14 showed EF 60-65% with severe aortic stenosis. The mean transaortic valve pressure gradient was 52 mmHg. The patient has mild shortness of breath which is at the baseline. The body weight decreased by 5 pounds. She is clinically euvolemic without any leg edema.  -will continue Lasix 80 mg daily.

## 2013-04-12 NOTE — Assessment & Plan Note (Signed)
Patient did not have new episodes of seizure since January (she had possible syncope rather than seizure episode in January). Followed up by neurologist, Dr. Mallie Darting. She has an appointment with Dr. Mallie Darting on 05/17/13. She is currently taking Zonegran and luminal.   -will continue currently regimen and follow up recommendations

## 2013-04-12 NOTE — Assessment & Plan Note (Signed)
Patient is followed up by cardiologist, Dr. Burt Knack. She was given referral to vascular surgeon for evaluation of AVRT. She has appointment with Dr. Roxy Manns on 05/03/13. She did not have new syncope of episode (had a minor feeling of presyncope, no loss of consciousness, no fall).   -will follow up recommendations - will continue Lasix 80 mg daily for congestive heart failure.

## 2013-04-14 NOTE — Progress Notes (Signed)
Case discussed with Dr. Blaine Hamper at the time of the visit.  We reviewed the resident's history and exam and pertinent patient test results.  I agree with the assessment, diagnosis, and plan of care documented in the resident's note.

## 2013-04-21 ENCOUNTER — Other Ambulatory Visit: Payer: Self-pay | Admitting: Internal Medicine

## 2013-05-03 ENCOUNTER — Institutional Professional Consult (permissible substitution) (INDEPENDENT_AMBULATORY_CARE_PROVIDER_SITE_OTHER): Payer: Medicare Other | Admitting: Thoracic Surgery (Cardiothoracic Vascular Surgery)

## 2013-05-03 ENCOUNTER — Other Ambulatory Visit: Payer: Self-pay | Admitting: *Deleted

## 2013-05-03 ENCOUNTER — Encounter: Payer: Self-pay | Admitting: Thoracic Surgery (Cardiothoracic Vascular Surgery)

## 2013-05-03 VITALS — BP 189/90 | HR 83 | Resp 20 | Ht 61.0 in | Wt 193.0 lb

## 2013-05-03 DIAGNOSIS — I350 Nonrheumatic aortic (valve) stenosis: Secondary | ICD-10-CM

## 2013-05-03 DIAGNOSIS — I359 Nonrheumatic aortic valve disorder, unspecified: Secondary | ICD-10-CM

## 2013-05-03 NOTE — H&P (Addendum)
[facility] [place] [facility] CONSULTATION REPORT  Referring Provider is Sherren Mocha, MD PCP is Nicoletta Dress, NA, MD  Chief Complaint  Patient presents with  . Aortic Stenosis    Surgical eval for AVR v/s TAVR, Echo 02/16/2013    HPI:  Patient is a 68 year old married [place] American female from [place] with complex past medical history who has been referred for surgical consultation to discuss treatment options for severe symptomatic aortic stenosis. The patient has a long history of seizure disorder which dates back more than 20 years. She also has severe degenerative arthritis with severe chronic bilateral knee pain and severe lower extremity weakness which has left the patient essentially nonambulatory.  She has long-standing hypertension and stage IV chronic kidney disease. She has been told that she suffered multiple miniature strokes by her neurologist who follows her for chronic seizures. She was first discovered to have a heart murmur on routine physical exam 5 or 6 years ago. Echocardiograms have documented the presence of aortic stenosis which has progressed in severity over the last few years. She was initially referred to Dr. Burt Knack last August, and repeat transthoracic echocardiogram performed 02/16/2013 revealed further progression of disease with peak velocity across the aortic valve measured 4.6 m/s corresponding to peak and mean transvalvular gradients estimated to be 85 and 52 mm mercury respectively. Left ventricular systolic function remains preserved. The patient has been hospitalized on several occasions over the past year, most recently in January with a near syncopal episode associated with bradycardia, 2-1 heart block and orthostatic hypotension.  Symptoms resolved with adjustment in her medical therapy.  Since hospital discharge the patient was seen in followup by Dr. Burt Knack who feels the patient would be  relatively poor candidate for conventional surgical aortic valve replacement because of her numerous comorbid medical problems. She has been referred for surgical consultation to discuss treatment options.  The patient lives at home with her husband. She has been disabled for several years because of her chronic medical problems. She battled severe chronic pain in the lower extremity weakness for years, and for the past several years she has been essentially nonambulatory. She is able to stand and transfer in and out of a motorized wheelchair. She describes stable symptoms of mild exertional shortness of breath which only occur with relatively strenuous activity.  She states that she does not get dyspneic with ordinary day to day activities and she has never had any resting shortness of breath, PND, or orthopnea. She has occasional "twinges" of atypical chest pain that are not related to physical exertion. She has had some dizziness and near-syncope related to her to most recent hospitalizations, although the patient currently denies ever having any significant dizzy spells and she states that she has not had a black out spell for several years.  She states for the past few years her a seizure like activity has been transient spells where she just "phases out". She has not had any generalized seizure-like activity for quite some time.  Past Medical History  Diagnosis Date  . Aortic stenosis     a. 06/2012 ECHO: severly calcified AV, with with mod AS by Doppler criteria. Sig LVH;  b. 01/2013 Echo: EF 60-65%, mod LVH, Sev AS (1.18cm^2 VTI, 1.35cm^2 Vmax), Severe MAC esp posteriorly, mildly dil LA, PASP 70mmHg.  [name] Hypertension   . Seizure disorder   . Hyperlipidemia   . Chronic diastolic heart failure     a.  06/2012 ECHO: EF [id number], grd 1 distolic dysfxn, severly calcified AV, with with mod AS by Doppler criteria. Sig LVH  . CKD (chronic kidney disease)     a. baseline CKD stage III  . Morbid obesity   .  Anemia   . Bilateral lower extremity edema   . Mobitz (type) I (Wenckebach's) atrioventricular block     a. 01/2013 - asymptomatic.  . Bradycardia     a. 01/2013 - asymptomatic.  [name] Coronary artery disease   . Heart murmur   . GERD (gastroesophageal reflux disease)   . Seizures   . Headache(784.0)   . Arthritis   . TIA (transient ischemic attack) 2014    pt stated she had "mini strokes"    Past Surgical History  Procedure Laterality Date  . Abdominal hysterectomy      No family history on file.  History   Social History  . Marital Status: Married    Spouse Name: N/A    Number of Children: N/A  . Years of Education: N/A   Occupational History  . Not on file.   Social History Main Topics  . Smoking status: Former Smoker    Types: Cigarettes    Quit date: 06/03/2000  . Smokeless tobacco: Not on file  . Alcohol Use: No  . Drug Use: No  . Sexual Activity: Not Currently   Other Topics Concern  . Not on file   Social History Narrative  . No narrative on file    Current Outpatient Prescriptions  Medication Sig Dispense Refill  . aspirin 81 MG EC tablet Take 81 mg by mouth daily.       . Calcium Carb-Cholecalciferol (CALCIUM 500 +D) 500-400 MG-UNIT TABS Take 1 tablet by mouth 2 (two) times daily.      . furosemide (LASIX) 80 MG tablet Take 1 tablet (80 mg total) by mouth daily.  90 tablet  3  . OVER THE COUNTER MEDICATION Place 1 application into the nose at bedtime. Nasal product      . PHENObarbital (LUMINAL) 60 MG tablet Take 1 tablet (60 mg total) by mouth at bedtime. Take with 97.2mg  tab  30 tablet  0  . PHENobarbital (LUMINAL) 97.2 MG tablet Take 97.2 mg by mouth. Take with 60mg  tab      . pravastatin (PRAVACHOL) 40 MG tablet TAKE 1 TABLET BY MOUTH DAILY  30 tablet  11  . Vitamin D, Ergocalciferol, (DRISDOL) 50000 UNITS CAPS capsule Take 50,000 Units by mouth every 7 (seven) days. On Friday      . zonisamide (ZONEGRAN) 100 MG capsule Take 200 mg by mouth at  bedtime.       No current facility-administered medications for this visit.    Allergies  Allergen Reactions  . Ibuprofen Other (See Comments)    Dr advised pt not to take ibuprofen      Review of Systems:   General:  normal appetite, normal energy, no weight gain, no weight loss, no fever  Cardiac:  no chest pain with exertion, occasional chest pain at rest, + SOB with exertion, no resting SOB, no PND, no orthopnea, + palpitations, + arrhythmia, no atrial fibrillation, + LE edema, + dizzy spells, no syncope  Respiratory:  + exertional shortness of breath, no home oxygen, no productive cough, occasional dry cough, no bronchitis, no wheezing, no hemoptysis, no asthma, no pain with inspiration or cough, no sleep apnea, no CPAP at night  GI:   no difficulty swallowing, no reflux, no frequent  heartburn, no hiatal hernia, no abdominal pain, + constipation, no diarrhea, no hematochezia, no hematemesis, no melena  GU:   no dysuria,  no frequency, no urinary tract infection, no hematuria, no kidney stones, + kidney disease  Vascular:  no pain suggestive of claudication, + pain in feet, + leg cramps, no varicose veins, no DVT, no non-healing foot ulcer  Neuro:   + remote stroke, no TIA's, + seizures, no headaches, no temporary blindness one eye,  + slurred speech, no peripheral neuropathy, + chronic pain, + instability of gait, some memory/cognitive dysfunction  Musculoskeletal: + severe arthritis, + joint swelling, no myalgias, + difficulty walking, very limited mobility   Skin:   no rash, no itching, + recent skin infections, no pressure sores or ulcerations  Psych:   no anxiety, no depression, no nervousness, no unusual recent stress  Eyes:   no blurry vision, no floaters, no recent vision changes, + wears glasses or contacts  ENT:   no hearing loss, no loose or painful teeth, no dentures, last saw dentist many years ago  Hematologic:  + easy bruising, no abnormal bleeding, no clotting disorder,  no frequent epistaxis  Endocrine:  no diabetes, does not check CBG's at home           Physical Exam:   BP 189/90  Pulse 83  Resp 20  Ht 5\' 1"  (1.549 m)  Wt 193 lb (87.544 kg)  BMI 36.49 kg/m2  SpO2 98%  General:  Obese female in NAD  HEENT:  Unremarkable   Neck:   no JVD, no bruits, no adenopathy   Chest:   clear to auscultation, symmetrical breath sounds, no wheezes, no rhonchi   CV:   RRR, grade III/VI crescendo/decrescendo murmur heard best at RUSB,  no diastolic murmur  Abdomen:  soft, non-tender, no masses   Extremities:  warm, well-perfused, pulses not palpable, + bilateral LE edema  Rectal/GU  Deferred  Neuro:   Grossly non-focal and symmetrical throughout  Skin:   Clean and dry, no rashes, no breakdown   Diagnostic Tests:  Transthoracic Echocardiography  Patient: Sherry [name], Sherry [name] MR #: [id number] Study Date: 02/16/2013 Gender: F Age: 69 Height: 154.9cm Weight: 94.2kg BSA: 2.9m^2 Pt. Status: Room: [place], MD ATTENDING Thompson Grayer, MD SONOGRAPHER [facility], RDCS ORDERING Perry [place] Dewaine Conger cc:  ------------------------------------------------------------ LV EF: 60% - 65%  ------------------------------------------------------------ History: PMH: Aortic Stenosis. Congestive heart failure. Risk factors: Hypertension. Dyslipidemia.  ------------------------------------------------------------ Study Conclusions  - Left ventricle: The cavity size was mildly dilated. Wall thickness was increased in a pattern of moderate LVH. Systolic function was normal. The estimated ejection fraction was in the range of 60% to 65%. - Aortic valve: There was severe stenosis. Valve area: 1.18cm^2(VTI). Valve area: 1.35cm^2 (Vmax). - Mitral valve: Severe MAC especially posteriorly - Left atrium: The atrium was mildly dilated. - Atrial septum: No defect or patent foramen ovale  was identified. - Pulmonary arteries: PA peak pressure: 2mm Hg (S). - Pericardium, extracardiac: A trivial pericardial effusion was identified. Transthoracic echocardiography. M-mode, complete 2D, spectral Doppler, and color Doppler. Height: Height: 154.9cm. Height: 61in. Weight: Weight: 94.2kg. Weight: 207.2lb. Body mass index: BMI: 39.3kg/m^2. Body surface area: BSA: 2.56m^2. Blood pressure: 128/35. Patient status: Inpatient. Location: ICU/CCU.  ------------------------------------------------------------  ------------------------------------------------------------ Left ventricle: The cavity size was mildly dilated. Wall thickness was increased in a pattern of moderate LVH. Systolic function was normal. The estimated ejection fraction was in the range of 60% to 65%.  ------------------------------------------------------------  Aortic valve: Severely calcified leaflets. Doppler: There was severe stenosis. VTI ratio of LVOT to aortic valve: 0.37. Valve area: 1.18cm^2(VTI). Indexed valve area: 0.57cm^2/m^2 (VTI). Peak velocity ratio of LVOT to aortic valve: 0.43. Valve area: 1.35cm^2 (Vmax). Indexed valve area: 0.65cm^2/m^2 (Vmax). Mean gradient: 102mm Hg (S). Peak gradient: 26mm Hg (S).  ------------------------------------------------------------ Mitral valve: Severe MAC especially posteriorly Doppler: Trivial regurgitation. Peak gradient: 15mm Hg (D).  ------------------------------------------------------------ Left atrium: The atrium was mildly dilated.  ------------------------------------------------------------ Atrial septum: No defect or patent foramen ovale was identified.  ------------------------------------------------------------ Right ventricle: The cavity size was normal. Wall thickness was normal. Systolic function was normal.  ------------------------------------------------------------ Pulmonic valve: Structurally normal valve. Cusp separation was  normal. Doppler: Transvalvular velocity was within the normal range. Trivial regurgitation.  ------------------------------------------------------------ Tricuspid valve: Structurally normal valve. Leaflet separation was normal. Doppler: Transvalvular velocity was within the normal range. Mild regurgitation.  ------------------------------------------------------------ Right atrium: The atrium was normal in size.  ------------------------------------------------------------ Pericardium: A trivial pericardial effusion was identified.  ------------------------------------------------------------  2D measurements Normal Doppler measurements Normal Left ventricle Main pulmonary LVID ED, 50.8 mm 43-52 artery chord, Pressure, 45 mm Hg =30 PLAX S LVID ES, 29.2 mm 23-38 Left ventricle chord, Ea, lat 13.4 cm/s ------ PLAX ann, tiss FS, 43 % >29 DP chord, E/Ea, lat 12.0 ------ PLAX ann, tiss 9 LVPW, ED 12.1 mm ------ DP IVS/LVPW 1.01 <1.3 Ea, med 7.79 cm/s ------ ratio, ED ann, tiss Ventricular septum DP IVS, ED 12.28 mm ------ E/Ea, med 20.8 ------ LVOT ann, tiss Diam, S 20 mm ------ DP Area 3.14 cm^2 ------ LVOT Aorta Peak vel, 198 cm/s ------ Root 26 mm ------ S diam, ED VTI, S 33.5 cm ------ Left atrium Peak 16 mm Hg ------ AP dim 34 mm ------ gradient, AP dim 1.65 cm/m^2 <2.2 S index Aortic valve Peak vel, 460 cm/s ------ S Mean vel, 333 cm/s ------ S VTI, S 89.5 cm ------ Mean 52 mm Hg ------ gradient, S Peak 85 mm Hg ------ gradient, S VTI ratio 0.37 ------ LVOT/AV Area, VTI 1.18 cm^2 ------ Area index 0.57 cm^2/m ------ (VTI) ^2 Peak vel 0.43 ------ ratio, LVOT/AV Area, Vmax 1.35 cm^2 ------ Area index 0.65 cm^2/m ------ (Vmax) ^2 Mitral valve Peak E vel 162 cm/s ------ Peak A vel 149 cm/s ------ Decelerati 194 ms 150-23 on time 0 Peak 10 mm Hg ------ gradient, D Peak E/A 1.1 ------ ratio Tricuspid valve Regurg 294 cm/s ------ peak vel Peak  RV-RA 35 mm Hg ------ gradient, S Systemic veins Estimated 10 mm Hg ------ CVP Right ventricle Pressure, 45 mm Hg <30 S Sa vel, 15.9 cm/s ------ lat ann, tiss DP  ------------------------------------------------------------ Prepared and Electronically Authenticated by  Jenkins Rouge 2014-12-30T14:06:01.803    STS Risk Calculator  Procedure    AVR  Risk of Mortality   2.1% Morbidity or Mortality  24.9% Prolonged LOS   12.0% Short LOS    21.2% Permanent Stroke   3.2% Prolonged Vent Support  16.7% DSW Infection    0.2% Renal Failure    6.9% Reoperation    9.7%    Impression:  The patient has severe symptomatic aortic stenosis with normal left ventricular systolic function. She has been hospitalized on 2 occasions recently with dizzy spells and near syncope, although these episodes have also been associated with bradycardia and both first and second degree AV block. From a symptomatic standpoint she might benefit from placement of permanent pacemaker if these symptoms persist.  However, I have personally reviewed the patient's most recent  transthoracic echocardiogram and there is no question that she has severe aortic stenosis with peak velocity across the aortic valve well above 4.5 m/s and severe thickening and restriction of mobility of all 3 calcified leaflets. Left ventricular systolic function remains normal.  The patient describes stable symptoms of mild exertional shortness of breath which occurs only with relatively strenuous exertion, consistent with chronic diastolic congestive heart failure New York Heart Association functional class II. The patient has a complex past medical history with severe comorbidities that are most notable for long-standing seizure disorder, previous stroke, morbid obesity, and extremely limited physical mobility with severe chronic pain and weakness in both lower extremities. I agree with Dr. Antionette Char impression that risks associated with  conventional surgical aortic valve replacement would be much higher than those predicted using traditional risk model such as the STS risk calculator. Although risks associated with conventional surgery would likely not be associated with an extremely high-risk of mortality, I think the risks of morbidity would be considerable, potentially > 50%.      Plan:  The patient and her family were counseled at length regarding treatment alternatives for management of severe symptomatic aortic stenosis. Alternative approaches such as conventional aortic valve replacement, transcatheter aortic valve replacement, and continued medical therapy were compared and contrasted at length.  The risks associated with conventional surgical aortic valve replacement were been discussed in detail, as were expectations for post-operative convalescence. Long-term prognosis with medical therapy was discussed. This discussion was placed in the context of the patient's own specific clinical presentation and past medical history.  All of their questions been addressed.  I think it would be reasonable to proceed with further diagnostic workup to begin with left and right heart catheterization. We will also obtain baseline physical therapy assessment and pulmonary function tests. Depending upon results of catheterization we will decide whether or not to proceed with CT angiography to further assess treatment alternatives. The patient will be followed in the multidisciplinary heart valve clinic.     Valentina Gu. Roxy Manns, MD 05/03/2013 11:55 AM

## 2013-05-07 ENCOUNTER — Telehealth: Payer: Self-pay | Admitting: Nurse Practitioner

## 2013-05-07 NOTE — Telephone Encounter (Signed)
Called patient to schedule right and left heart catheterization per Dr. Burt Knack and Dr. Roxy Manns.  Patient scheduled for 3/30 per request that she can only have appointments on Monday.  Patient states that she already has an appointment in [facility] on that day for a doctor "that checks on my heart and my seizures."  I advised patient that next available Monday with Dr. Burt Knack in the cath lab is 4/20.  I advised patient to discuss these 2 appointments with her daughter who is her transportation and to call back to schedule.  I advised patient that it is important that she have this procedure due to the progression of her symptoms from aortic stenosis.  Patient verbalized understanding and agreement and will call back to let me know her decision.

## 2013-05-10 ENCOUNTER — Encounter: Payer: Self-pay | Admitting: Internal Medicine

## 2013-05-10 ENCOUNTER — Ambulatory Visit (HOSPITAL_COMMUNITY)
Admission: RE | Admit: 2013-05-10 | Discharge: 2013-05-10 | Disposition: A | Discharge status: Home / Self Care | Payer: Medicare Other | Source: Ambulatory Visit | Attending: Thoracic Surgery (Cardiothoracic Vascular Surgery) | Admitting: Thoracic Surgery (Cardiothoracic Vascular Surgery)

## 2013-05-10 ENCOUNTER — Encounter (HOSPITAL_COMMUNITY): Payer: Self-pay | Admitting: Pharmacy Technician

## 2013-05-10 DIAGNOSIS — I359 Nonrheumatic aortic valve disorder, unspecified: Secondary | ICD-10-CM | POA: Insufficient documentation

## 2013-05-10 LAB — PULMONARY FUNCTION TEST
DL/VA % PRED: 138 %
DL/VA: 6.09 ml/min/mmHg/L
DLCO UNC % PRED: 107 %
DLCO UNC: 21.83 ml/min/mmHg
DLCO cor % pred: 107 %
DLCO cor: 21.83 ml/min/mmHg
FEF 25-75 POST: 1.99 L/s
FEF 25-75 Pre: 1.35 L/sec
FEF2575-%Change-Post: 47 %
FEF2575-%Pred-Post: 124 %
FEF2575-%Pred-Pre: 84 %
FEV1-%CHANGE-POST: 8 %
FEV1-%PRED-POST: 95 %
FEV1-%Pred-Pre: 88 %
FEV1-POST: 1.58 L
FEV1-Pre: 1.46 L
FEV1FVC-%Change-Post: 9 %
FEV1FVC-%Pred-Pre: 99 %
FEV6-%Change-Post: 0 %
FEV6-%PRED-PRE: 90 %
FEV6-%Pred-Post: 91 %
FEV6-POST: 1.86 L
FEV6-PRE: 1.86 L
FEV6FVC-%CHANGE-POST: 0 %
FEV6FVC-%PRED-POST: 104 %
FEV6FVC-%PRED-PRE: 103 %
FVC-%Change-Post: 0 %
FVC-%PRED-PRE: 87 %
FVC-%Pred-Post: 87 %
FVC-PRE: 1.87 L
FVC-Post: 1.86 L
PRE FEV6/FVC RATIO: 99 %
Post FEV1/FVC ratio: 85 %
Post FEV6/FVC ratio: 100 %
Pre FEV1/FVC ratio: 78 %
RV % PRED: 118 %
RV: 2.34 L
TLC % pred: 94 %
TLC: 4.35 L

## 2013-05-10 MED ORDER — ALBUTEROL SULFATE (2.5 MG/3ML) 0.083% IN NEBU
2.5000 mg | INHALATION_SOLUTION | Freq: Once | RESPIRATORY_TRACT | Status: AC
Start: 2013-05-10 — End: 2013-05-10
  Administered 2013-05-10: 2.5 mg via RESPIRATORY_TRACT

## 2013-05-12 ENCOUNTER — Encounter: Payer: Self-pay | Admitting: Nurse Practitioner

## 2013-05-12 ENCOUNTER — Telehealth: Payer: Self-pay | Admitting: Nurse Practitioner

## 2013-05-12 DIAGNOSIS — I359 Nonrheumatic aortic valve disorder, unspecified: Secondary | ICD-10-CM

## 2013-05-12 NOTE — Telephone Encounter (Signed)
Spoke with patient and per her request have scheduled her for a cardiac catheterization on Monday 4/6 with Dr. Tamala Julian.  Patient is scheduled for pre-cath labs on 3/31 here at [facility].  I reviewed pre-cath instructions with patient on telephone and advised her that I am mailing her a copy of the instructions.  Patient verbalized understanding of all instructions.  Orders in epic.

## 2013-05-13 ENCOUNTER — Encounter: Payer: Self-pay | Admitting: Internal Medicine

## 2013-05-13 ENCOUNTER — Telehealth: Payer: Self-pay | Admitting: Cardiovascular Disease

## 2013-05-13 NOTE — Telephone Encounter (Signed)
New problem   Pt need to speak to nurse to see if an order can be sent to [facility] for labs that Dr Burt Knack need. Pt has an appt at [facility] on 05/17/13. Please call pt

## 2013-05-13 NOTE — Telephone Encounter (Signed)
Pt is aware that she is scheduled for pre-cath labs on 05/18/13 at [address] office ; also that her cardiac cath on 05/24/13 at [place]. Pt states that her daughter wonted  for her to get the labs in [place] when she goes on Tuesday 05/19/13 . Pt was not sure what kind of appointment she had and with whom. Pt states will keep the two appointments here.

## 2013-05-14 ENCOUNTER — Emergency Department (HOSPITAL_COMMUNITY)
Admission: EM | Admit: 2013-05-14 | Discharge: 2013-05-14 | Disposition: A | Discharge status: Home / Self Care | Payer: Medicare Other | Attending: Emergency Medicine | Admitting: Emergency Medicine

## 2013-05-14 ENCOUNTER — Other Ambulatory Visit: Payer: Self-pay | Admitting: Internal Medicine

## 2013-05-14 ENCOUNTER — Encounter (HOSPITAL_COMMUNITY): Payer: Self-pay | Admitting: Emergency Medicine

## 2013-05-14 ENCOUNTER — Emergency Department (HOSPITAL_COMMUNITY): Payer: Medicare Other

## 2013-05-14 DIAGNOSIS — I359 Nonrheumatic aortic valve disorder, unspecified: Secondary | ICD-10-CM | POA: Insufficient documentation

## 2013-05-14 DIAGNOSIS — E785 Hyperlipidemia, unspecified: Secondary | ICD-10-CM | POA: Insufficient documentation

## 2013-05-14 DIAGNOSIS — N183 Chronic kidney disease, stage 3 unspecified: Secondary | ICD-10-CM | POA: Diagnosis present

## 2013-05-14 DIAGNOSIS — G40909 Epilepsy, unspecified, not intractable, without status epilepticus: Secondary | ICD-10-CM | POA: Diagnosis present

## 2013-05-14 DIAGNOSIS — I2510 Atherosclerotic heart disease of native coronary artery without angina pectoris: Secondary | ICD-10-CM | POA: Insufficient documentation

## 2013-05-14 DIAGNOSIS — N179 Acute kidney failure, unspecified: Secondary | ICD-10-CM | POA: Diagnosis present

## 2013-05-14 DIAGNOSIS — Z7982 Long term (current) use of aspirin: Secondary | ICD-10-CM | POA: Insufficient documentation

## 2013-05-14 DIAGNOSIS — M129 Arthropathy, unspecified: Secondary | ICD-10-CM | POA: Insufficient documentation

## 2013-05-14 DIAGNOSIS — I350 Nonrheumatic aortic (valve) stenosis: Secondary | ICD-10-CM | POA: Diagnosis present

## 2013-05-14 DIAGNOSIS — I5032 Chronic diastolic (congestive) heart failure: Secondary | ICD-10-CM | POA: Diagnosis present

## 2013-05-14 DIAGNOSIS — D649 Anemia, unspecified: Secondary | ICD-10-CM | POA: Insufficient documentation

## 2013-05-14 DIAGNOSIS — K219 Gastro-esophageal reflux disease without esophagitis: Secondary | ICD-10-CM | POA: Insufficient documentation

## 2013-05-14 DIAGNOSIS — N189 Chronic kidney disease, unspecified: Secondary | ICD-10-CM | POA: Insufficient documentation

## 2013-05-14 DIAGNOSIS — Z8673 Personal history of transient ischemic attack (TIA), and cerebral infarction without residual deficits: Secondary | ICD-10-CM | POA: Insufficient documentation

## 2013-05-14 DIAGNOSIS — Z79899 Other long term (current) drug therapy: Secondary | ICD-10-CM | POA: Insufficient documentation

## 2013-05-14 DIAGNOSIS — R569 Unspecified convulsions: Secondary | ICD-10-CM | POA: Insufficient documentation

## 2013-05-14 DIAGNOSIS — R079 Chest pain, unspecified: Secondary | ICD-10-CM

## 2013-05-14 DIAGNOSIS — Z6841 Body Mass Index (BMI) 40.0 and over, adult: Secondary | ICD-10-CM

## 2013-05-14 DIAGNOSIS — I120 Hypertensive chronic kidney disease with stage 5 chronic kidney disease or end stage renal disease: Secondary | ICD-10-CM | POA: Insufficient documentation

## 2013-05-14 DIAGNOSIS — I6381 Other cerebral infarction due to occlusion or stenosis of small artery: Secondary | ICD-10-CM | POA: Diagnosis present

## 2013-05-14 DIAGNOSIS — I441 Atrioventricular block, second degree: Secondary | ICD-10-CM | POA: Diagnosis present

## 2013-05-14 DIAGNOSIS — N184 Chronic kidney disease, stage 4 (severe): Secondary | ICD-10-CM | POA: Diagnosis present

## 2013-05-14 DIAGNOSIS — R011 Cardiac murmur, unspecified: Secondary | ICD-10-CM | POA: Insufficient documentation

## 2013-05-14 DIAGNOSIS — R269 Unspecified abnormalities of gait and mobility: Secondary | ICD-10-CM | POA: Diagnosis present

## 2013-05-14 DIAGNOSIS — Z87891 Personal history of nicotine dependence: Secondary | ICD-10-CM | POA: Insufficient documentation

## 2013-05-14 DIAGNOSIS — Z8679 Personal history of other diseases of the circulatory system: Secondary | ICD-10-CM | POA: Insufficient documentation

## 2013-05-14 DIAGNOSIS — I10 Essential (primary) hypertension: Secondary | ICD-10-CM | POA: Diagnosis present

## 2013-05-14 DIAGNOSIS — R609 Edema, unspecified: Secondary | ICD-10-CM | POA: Diagnosis present

## 2013-05-14 LAB — CBC
HCT: 36.8 % (ref 36.0–46.0)
HEMOGLOBIN: 12.4 g/dL (ref 12.0–15.0)
MCH: 29.2 pg (ref 26.0–34.0)
MCHC: 33.7 g/dL (ref 30.0–36.0)
MCV: 86.6 fL (ref 78.0–100.0)
Platelets: 231 10*3/uL (ref 150–400)
RBC: 4.25 MIL/uL (ref 3.87–5.11)
RDW: 14 % (ref 11.5–15.5)
WBC: 3.1 10*3/uL — ABNORMAL LOW (ref 4.0–10.5)

## 2013-05-14 LAB — BASIC METABOLIC PANEL
BUN: 20 mg/dL (ref 6–23)
CO2: 26 meq/L (ref 19–32)
CREATININE: 1.37 mg/dL — AB (ref 0.50–1.10)
Calcium: 9.5 mg/dL (ref 8.4–10.5)
Chloride: 100 mEq/L (ref 96–112)
GFR calc Af Amer: 45 mL/min — ABNORMAL LOW (ref >90)
GFR, EST NON AFRICAN AMERICAN: 39 mL/min — AB (ref >90)
GLUCOSE: 87 mg/dL (ref 70–99)
Potassium: 3.6 mEq/L — ABNORMAL LOW (ref 3.7–5.3)
SODIUM: 141 meq/L (ref 137–147)

## 2013-05-14 LAB — TROPONIN I

## 2013-05-14 LAB — I-STAT TROPONIN, ED: Troponin i, poc: 0.15 ng/mL (ref 0.00–0.08)

## 2013-05-14 LAB — PRO B NATRIURETIC PEPTIDE: Pro B Natriuretic peptide (BNP): 2373 pg/mL — ABNORMAL HIGH (ref 0–125)

## 2013-05-14 MED ORDER — ASPIRIN 81 MG PO CHEW
324.0000 mg | CHEWABLE_TABLET | Freq: Once | ORAL | Status: AC
  Administered 2013-05-14: 324 mg via ORAL
  Filled 2013-05-14: qty 4

## 2013-05-14 MED ORDER — HYDRALAZINE HCL 20 MG/ML IJ SOLN
10.0000 mg | Freq: Once | INTRAMUSCULAR | Status: AC
  Administered 2013-05-14: 10 mg via INTRAVENOUS
  Filled 2013-05-14: qty 1

## 2013-05-14 MED ORDER — HYDRALAZINE HCL 20 MG/ML IJ SOLN: 10.0000 mg | Freq: Once | INTRAMUSCULAR | Status: DC

## 2013-05-14 NOTE — H&P (Signed)
Patient ID: Sherry [name] MRN: [id number], DOB/AGE: 1945/11/27   Admit date: 05/14/2013   Primary Physician: Charlann Lange, MD Primary Cardiologist: Dr Jerilynn Mages. Cooper  HPI: 68 y/o AA female with severe AS and multiple comorbidities, presents to the ER this pm with chest pain. She was just evaluated 05/03/13 evaluated by Dr Nils Pyle for consideration of traditional AVR vs TAVR vs medical Rx. Her last echo showed her peak gradient to be 85 mmHg, mean 52 mmHg. She has preserved LVF. She has stage 3 CRI (l SCr 1.37 today), prior CVA/TIA, asymptomatic Mobitz 1 second degree AVB, seizure disorder, morbid obesity, and chronic lower extremity pain and immobility.  Dr Nils Pyle had suggested further work up including PFTs, PT evaluation, and Rt and Lt heart cath.  Today she developed a "tickle" in her chest. This gradually progressed to vague pressure. She denies any radiation to her jaw, increased dyspnea, or nausea. In the ER her Troponin POC is elevated 0.15, her Tropon I is negative x 1. It also should be noted that her B/P is elevated and her BNP is elevated 2373. She is currently comfortable but still complains of vague Lt sided chest discomfort.    Problem List: Past Medical History  Diagnosis Date  . Aortic stenosis     a. 06/2012 ECHO: severly calcified AV, with with mod AS by Doppler criteria. Sig LVH;  b. 01/2013 Echo: EF 60-65%, mod LVH, Sev AS (1.18cm^2 VTI, 1.35cm^2 Vmax), Severe MAC esp posteriorly, mildly dil LA, PASP 58mmHg.  [name] Hypertension   . Seizure disorder   . Hyperlipidemia   . Chronic diastolic heart failure     a. 06/2012 ECHO: EF [id number], grd 1 distolic dysfxn, severly calcified AV, with with mod AS by Doppler criteria. Sig LVH  . CKD (chronic kidney disease)     a. baseline CKD stage III  . Morbid obesity   . Anemia   . Bilateral lower extremity edema   . Mobitz (type) I (Wenckebach's) atrioventricular block     a. 01/2013 - asymptomatic.  . Bradycardia     a. 01/2013 -  asymptomatic.  [name] Coronary artery disease   . Heart murmur   . GERD (gastroesophageal reflux disease)   . Seizures   . Headache(784.0)   . Arthritis   . TIA (transient ischemic attack) 2014    pt stated she had "mini strokes"    Past Surgical History  Procedure Laterality Date  . Abdominal hysterectomy       Allergies:  Allergies  Allergen Reactions  . Ibuprofen Other (See Comments)    Dr advised pt not to take ibuprofen     Home Medications No current facility-administered medications for this encounter.   Current Outpatient Prescriptions  Medication Sig Dispense Refill  . aspirin 81 MG EC tablet Take 81 mg by mouth daily.       . Calcium Carb-Cholecalciferol (CALCIUM 500 +D) 500-400 MG-UNIT TABS Take 1 tablet by mouth 2 (two) times daily.      . furosemide (LASIX) 80 MG tablet Take 80 mg by mouth daily.      [name] PHENObarbital (LUMINAL) 60 MG tablet Take 60 mg by mouth at bedtime. Take with 97.2mg  tab      . PHENobarbital (LUMINAL) 97.2 MG tablet Take 97.2 mg by mouth. Take with 60mg  tab      . pravastatin (PRAVACHOL) 40 MG tablet Take 40 mg by mouth daily.      . Vitamin D, Ergocalciferol, (DRISDOL) 50000  UNITS CAPS capsule Take 50,000 Units by mouth every 7 (seven) days. On Friday      . zonisamide (ZONEGRAN) 100 MG capsule Take 200 mg by mouth at bedtime.         FMHX- negative for early CAD  History   Social History  . Marital Status: Married    Spouse Name: N/A    Number of Children: N/A  . Years of Education: N/A   Occupational History  . Not on file.   Social History Main Topics  . Smoking status: Former Smoker    Types: Cigarettes    Quit date: 06/03/2000  . Smokeless tobacco: Not on file  . Alcohol Use: No  . Drug Use: No  . Sexual Activity: Not Currently   Other Topics Concern  . Not on file   Social History Narrative  . No narrative on file     Review of Systems: General: negative for chills, fever, night sweats or weight changes.    Cardiovascular: negative for chest pain, dyspnea on exertion, edema, orthopnea, palpitations, paroxysmal nocturnal dyspnea or shortness of breath Dermatological: negative for rash Respiratory: negative for cough or wheezing Urologic: negative for hematuria Abdominal: negative for nausea, vomiting, diarrhea, bright red blood per rectum, melena, or hematemesis Neurologic: negative for visual changes, syncope, or dizziness All other systems reviewed and are otherwise negative except as noted above.  Physical Exam: Blood pressure 187/88, pulse 65, temperature 98 F (36.7 C), temperature source Oral, resp. rate 16, height 5\' 1"  (1.549 m), weight 193 lb (87.544 kg), SpO2 99.00%.  General appearance: alert, cooperative, no distress and moderately obese Neck: transmitted murmur to carotids Lungs: clear to auscultation bilaterally Heart: regular rate and rhythm and 3/6 sytolic murmur AOv, with diminnished S2 Abdomen: obese, non tender Extremities: chronic edema, compression stockings in place Pulses: diminnished distal pulses Skin: cool and dry Neurologic: Grossly normal    Labs:   Results for orders placed during the hospital encounter of 05/14/13 (from the past 24 hour(s))  CBC     Status: Abnormal   Collection Time    05/14/13  2:55 PM      Result Value Ref Range   WBC 3.1 (*) 4.0 - 10.5 K/uL   RBC 4.25  3.87 - 5.11 MIL/uL   Hemoglobin 12.4  12.0 - 15.0 g/dL   HCT 36.8  36.0 - 46.0 %   MCV 86.6  78.0 - 100.0 fL   MCH 29.2  26.0 - 34.0 pg   MCHC 33.7  30.0 - 36.0 g/dL   RDW 14.0  11.5 - 15.5 %   Platelets 231  150 - 400 K/uL  BASIC METABOLIC PANEL     Status: Abnormal   Collection Time    05/14/13  2:55 PM      Result Value Ref Range   Sodium 141  137 - 147 mEq/L   Potassium 3.6 (*) 3.7 - 5.3 mEq/L   Chloride 100  96 - 112 mEq/L   CO2 26  19 - 32 mEq/L   Glucose, Bld 87  70 - 99 mg/dL   BUN 20  6 - 23 mg/dL   Creatinine, Ser 1.37 (*) 0.50 - 1.10 mg/dL   Calcium 9.5  8.4 -  10.5 mg/dL   GFR calc non Af Amer 39 (*) >90 mL/min   GFR calc Af Amer 45 (*) >90 mL/min  PRO B NATRIURETIC PEPTIDE     Status: Abnormal   Collection Time    05/14/13  2:55  PM      Result Value Ref Range   Pro B Natriuretic peptide (BNP) 2373.0 (*) 0 - 125 pg/mL  TROPONIN I     Status: None   Collection Time    05/14/13  2:55 PM      Result Value Ref Range   Troponin I <0.30  <0.30 ng/mL  I-STAT TROPOININ, ED     Status: Abnormal   Collection Time    05/14/13  3:08 PM      Result Value Ref Range   Troponin i, poc 0.15 (*) 0.00 - 0.08 ng/mL   Comment NOTIFIED PHYSICIAN     Comment 3              Radiology/Studies: No results found.  EKG:NSR, Ist degree AVB, LVH  ASSESSMENT AND PLAN:  Principal Problem:   Chest pain Active Problems:   Severe aortic stenosis   HTN- B/P up in ER   CKD (chronic kidney disease) stage 3, GFR 30-59 ml/min   Chronic diastolic congestive heart failure   Heart block AV second degree   SEIZURE DISORDER   Abnormality of gait- basically wheelchair bound   LEG EDEMA, BILATERAL   Morbid obesity with BMI of 40.0-44.9, adult   Multiple lacunar infarcts   PLAN: Will review with MD she is scheduled to have a cath 05/24/13. - ? Admit,  R/O MI, B/P control. Consider going ahead with cath Rt and Lt Monday.    Henri Medal, PA-C 05/14/2013, 5:09 PM  Patient seen and examined with Melina Copa, PA-C. We discussed all aspects of the encounter. I agree with the assessment and plan as stated above.   CP does not appear ischemic. Denies HF or syncope. She is at baseline. Main issue is severe HTN. Will give a dose of IV hydralazine and if we can get SBP 160-170 she should be stable for d/c from ER with outpatient f/u as scheduled.  Daniel Bensimhon,MD 6:12 PM

## 2013-05-14 NOTE — ED Provider Notes (Signed)
CSN: [id number]     Arrival date & time 05/14/13  1444 History   First MD Initiated Contact with Patient 05/14/13 1519     Chief Complaint  Patient presents with  . Chest Pain      HPI  She presents with episode of chest pain. She is a complex past medical history including critical aortic stenosis is been recently progressive. Has been seen by cardiothoracic surgery, and also Dr. Sherren Mocha of cardiology. Scheduled to have outpatient left and right heart catheterizations prior to a formal discussion about possible TAVR . She was not thought to be a candidate for open or formal aortic valve replacement.  While sitting in her bathroom on her scooter chair she had episode of chest pain pressure in her chest. Started as a "tickling".  Over a few hours it became a pressure lasting 3 hours and resolved while en route here. She is asymptomatic upon her arrival here.  Past Medical History  Diagnosis Date  . Aortic stenosis 12/14    Severe- mean grad 52, peak 85  . Hypertension   . Seizure disorder   . Hyperlipidemia   . Chronic diastolic heart failure   . CKD (chronic kidney disease)     a. baseline CKD stage III  . Morbid obesity   . Anemia   . Bilateral lower extremity edema   . Mobitz (type) I (Wenckebach's) atrioventricular block     a. 01/2013 - asymptomatic.  . Bradycardia     a. 01/2013 - asymptomatic.  [name] Coronary artery disease   . Heart murmur   . GERD (gastroesophageal reflux disease)   . Seizures   . Headache(784.0)   . Arthritis   . TIA (transient ischemic attack) 2014    pt stated she had "mini strokes"   Past Surgical History  Procedure Laterality Date  . Abdominal hysterectomy     History reviewed. No pertinent family history. History  Substance Use Topics  . Smoking status: Former Smoker    Types: Cigarettes    Quit date: 06/03/2000  . Smokeless tobacco: Not on file  . Alcohol Use: No   OB History   Grav Para Term Preterm Abortions TAB SAB Ect Mult  Living                 Review of Systems  Constitutional: Negative for fever, chills, diaphoresis, appetite change and fatigue.  HENT: Negative for mouth sores, sore throat and trouble swallowing.   Eyes: Negative for visual disturbance.  Respiratory: Positive for shortness of breath. Negative for cough, chest tightness and wheezing.   Cardiovascular: Positive for chest pain.  Gastrointestinal: Negative for nausea, vomiting, abdominal pain, diarrhea and abdominal distention.  Endocrine: Negative for polydipsia, polyphagia and polyuria.  Genitourinary: Negative for dysuria, frequency and hematuria.  Musculoskeletal: Negative for gait problem.  Skin: Negative for color change, pallor and rash.  Neurological: Negative for dizziness, syncope, light-headedness and headaches.  Hematological: Does not bruise/bleed easily.  Psychiatric/Behavioral: Negative for behavioral problems and confusion.      Allergies  Ibuprofen  Home Medications   Current Outpatient Rx  Name  Route  Sig  Dispense  Refill  . aspirin 81 MG EC tablet   Oral   Take 81 mg by mouth daily.          . Calcium Carb-Cholecalciferol (CALCIUM 500 +D) 500-400 MG-UNIT TABS   Oral   Take 1 tablet by mouth 2 (two) times daily.         [name]  furosemide (LASIX) 80 MG tablet   Oral   Take 80 mg by mouth daily.         [name] PHENObarbital (LUMINAL) 60 MG tablet   Oral   Take 60 mg by mouth at bedtime. Take with 97.2mg  tab         . PHENobarbital (LUMINAL) 97.2 MG tablet   Oral   Take 97.2 mg by mouth. Take with 60mg  tab         . pravastatin (PRAVACHOL) 40 MG tablet   Oral   Take 40 mg by mouth daily.         . Vitamin D, Ergocalciferol, (DRISDOL) 50000 UNITS CAPS capsule   Oral   Take 50,000 Units by mouth every 7 (seven) days. On Friday         . zonisamide (ZONEGRAN) 100 MG capsule   Oral   Take 200 mg by mouth at bedtime.          BP 165/58  Pulse 70  Temp(Src) 98 F (36.7 C) (Oral)  Resp 16   Ht 5\' 1"  (1.549 m)  Wt 193 lb (87.544 kg)  BMI 36.49 kg/m2  SpO2 100% Physical Exam  Constitutional: She is oriented to person, place, and time. She appears well-developed and well-nourished. No distress.  HENT:  Head: Normocephalic.  Eyes: Conjunctivae are normal. Pupils are equal, round, and reactive to light. No scleral icterus.  Neck: Normal range of motion. Neck supple. No thyromegaly present.  Cardiovascular: Normal rate and regular rhythm.  Exam reveals no gallop and no friction rub.   Murmur heard. Crescendoing murmur across the anterior precordium and radiating to the carotids.  Murmur consistent with Aortic stenosis.  Pulmonary/Chest: Effort normal and breath sounds normal. No respiratory distress. She has no wheezes. She has no rales.  Abdominal: Soft. Bowel sounds are normal. She exhibits no distension. There is no tenderness. There is no rebound.  Musculoskeletal: Normal range of motion.  Neurological: She is alert and oriented to person, place, and time.  Skin: Skin is warm and dry. No rash noted.  Psychiatric: She has a normal mood and affect. Her behavior is normal.    ED Course  Procedures (including critical care time) Labs Review Labs Reviewed  CBC - Abnormal; Notable for the following:    WBC 3.1 (*)    All other components within normal limits  BASIC METABOLIC PANEL - Abnormal; Notable for the following:    Potassium 3.6 (*)    Creatinine, Ser 1.37 (*)    GFR calc non Af Amer 39 (*)    GFR calc Af Amer 45 (*)    All other components within normal limits  PRO B NATRIURETIC PEPTIDE - Abnormal; Notable for the following:    Pro B Natriuretic peptide (BNP) 2373.0 (*)    All other components within normal limits  I-STAT TROPOININ, ED - Abnormal; Notable for the following:    Troponin i, poc 0.15 (*)    All other components within normal limits  TROPONIN I   Imaging Review Dg Chest Port 1 View  05/14/2013   CLINICAL DATA:  Chest pain  EXAM: PORTABLE CHEST  - 1 VIEW  COMPARISON:  None.  FINDINGS: There is no focal parenchymal opacity, pleural effusion, or pneumothorax. There is cardiomegaly. Normal pulmonary vasculature.  There are degenerative changes of bilateral acromioclavicular joints. There is mild thoracic spine spondylosis.  IMPRESSION: No active disease.   Electronically Signed   By: Kathreen Devoid   On: 05/14/2013  17:16     EKG Interpretation   Date/Time:  Friday May 14 2013 14:50:28 EDT Ventricular Rate:  71 PR Interval:  302 QRS Duration: 98 QT Interval:  424 QTC Calculation: 460 R Axis:   -10 Text Interpretation:  Sinus rhythm with 1st degree A-V block Left  ventricular hypertrophy with repolarization abnormality Abnormal ECG  Confirmed by Jeneen Rinks  MD, [place] ([id number]) on 05/14/2013 7:24:11 PM      MDM   Final diagnoses:  HTN (hypertension)    Formal troponin I is normal. No acute abnormalities on EKG. Patient seen in ER by Dr. Britta Mccreedy and cardiology. He is planning discharge of the patient. She was given hydralazine her blood pressures improved.    Tanna Furry, MD 05/14/13 1925

## 2013-05-14 NOTE — Discharge Instructions (Signed)
Aortic Stenosis Aortic stenosis is a narrowing of the aortic valve. The aortic valve is a gatelike structure that is located between the lower left chamber of the heart (left ventricle) and the blood vessel that leads away from the heart (aorta). When the aortic valve is narrowed, it does not open all the way. This makes it hard for the heart to pump blood into the aorta and causes the heart to work harder. The extra work can weaken the heart over time and lead to heart failure. CAUSES  Causes of aortic stenosis include:  Calcium deposits on the aortic valve that have made the valve stiff. This condition generally affects those over the age of 19. It is the most common cause of aortic stenosis.  A birth defect.  Rheumatic fever. This is a problem that may occur after a strep throat infection that was not treated adequately. Rheumatic fever can cause permanent damage to heart valves. SYMPTOMS  People with aortic stenosis usually have no symptoms until the condition becomes severe. It may take 10 20 years for mild or moderate aortic stenosis to become severe. Symptoms may include:   Shortness of breath, especially with physical activity.   Feeling weak and tired (fatigued) or getting tired easily.  Chest discomfort (angina). This may occur with minimal activity if the aortic stenosis is severe.  An irregular or faster-than-normal heartbeat.  Dizziness or fainting that happens with exertion or after taking certain heart medicines (such as nitroglycerin). DIAGNOSIS  Aortic stenosis is usually diagnosed with a physical exam and with a type of imaging test called echocardiography. During echocardiography, sound waves are used to evaluate how blood flows through the heart. If your caregiver suspects aortic stenosis but the test does not clearly show it, a procedure called cardiac catheterization may be done to diagnose the condition. Tests may also be done to evaluate heart function. They may  include:  Electrocardiography. During this test, the electrical impulses of the heart are recorded while you are lying down and sticky patches are placed on your chest, arms, and legs.  Stress tests. There is more than one type of stress test. If a stress test is needed, ask your caregiver about which type is best for you.  Blood tests. TREATMENT  Treatment depends on how severe the aortic stenosis is, your symptoms, and the problems it is causing.   Observation. If the aortic stenosis is mild, no treatment may be needed. However, you will need to have the condition checked regularly to make sure it is not getting worse or causing serious problems.  Surgery. Surgery to repair or replace the aortic valve is the most common treatment for aortic stenosis. Several types of surgeries are available. The most common are open-heart surgery and transcutaneous aortic valve replacement (TAVR). TAVR does not require that the chest be opened. It is usually performed on elderly patients and those who are not able to have open-heart surgery.  Medicines. Medicines may be given to keep symptoms from getting worse. Medicines cannot reverse aortic stenosis. HOME CARE INSTRUCTIONS   You may need to avoid certain types of physical activity. If your aortic stenosis is mild, you may need to avoid only strenuous activity. The more severe your aortic stenosis, the more activities you will need to avoid. Talk with your caregiver about the types of activity you should avoid.  Take all medicines as directed by your caregiver.  If you are a woman with aortic valve stenosis and want to get pregnant, talk  to your caregiver before you become pregnant.  If you are a woman with aortic valve stenosis and are pregnant, keep all follow-up appointments with all recommended caregivers.  Keep all follow-up appointments for tests, exams, and treatments. SEEK IMMEDIATE MEDICAL CARE IF:  You develop chest pain or tightness.    You develop shortness of breath or difficulty breathing.   You develop lightheadedness or faint.   It feels like your heartbeat is irregular or faster than normal.  You have a fever or persistent symptoms for more than 2 3 days. Document Released: 11/03/2002 Document Revised: 06/01/2012 Document Reviewed: 01/30/2012 [facility] Patient Information 2014 [place], [place].  Chest Pain (Nonspecific) It is often hard to give a specific diagnosis for the cause of chest pain. There is always a chance that your pain could be related to something serious, such as a heart attack or a blood clot in the lungs. You need to follow up with your caregiver for further evaluation. CAUSES   Heartburn.  Pneumonia or bronchitis.  Anxiety or stress.  Inflammation around your heart (pericarditis) or lung (pleuritis or pleurisy).  A blood clot in the lung.  A collapsed lung (pneumothorax). It can develop suddenly on its own (spontaneous pneumothorax) or from injury (trauma) to the chest.  Shingles infection (herpes zoster virus). The chest wall is composed of bones, muscles, and cartilage. Any of these can be the source of the pain.  The bones can be bruised by injury.  The muscles or cartilage can be strained by coughing or overwork.  The cartilage can be affected by inflammation and become sore (costochondritis). DIAGNOSIS  Lab tests or other studies, such as X-rays, electrocardiography, stress testing, or cardiac imaging, may be needed to find the cause of your pain.  TREATMENT   Treatment depends on what may be causing your chest pain. Treatment may include:  Acid blockers for heartburn.  Anti-inflammatory medicine.  Pain medicine for inflammatory conditions.  Antibiotics if an infection is present.  You may be advised to change lifestyle habits. This includes stopping smoking and avoiding alcohol, caffeine, and chocolate.  You may be advised to keep your head raised (elevated) when  sleeping. This reduces the chance of acid going backward from your stomach into your esophagus.  Most of the time, nonspecific chest pain will improve within 2 to 3 days with rest and mild pain medicine. HOME CARE INSTRUCTIONS   If antibiotics were prescribed, take your antibiotics as directed. Finish them even if you start to feel better.  For the next few days, avoid physical activities that bring on chest pain. Continue physical activities as directed.  Do not smoke.  Avoid drinking alcohol.  Only take over-the-counter or prescription medicine for pain, discomfort, or fever as directed by your caregiver.  Follow your caregiver's suggestions for further testing if your chest pain does not go away.  Keep any follow-up appointments you made. If you do not go to an appointment, you could develop lasting (chronic) problems with pain. If there is any problem keeping an appointment, you must call to reschedule. SEEK MEDICAL CARE IF:   You think you are having problems from the medicine you are taking. Read your medicine instructions carefully.  Your chest pain does not go away, even after treatment.  You develop a rash with blisters on your chest. SEEK IMMEDIATE MEDICAL CARE IF:   You have increased chest pain or pain that spreads to your arm, neck, jaw, back, or abdomen.  You develop shortness of breath,  an increasing cough, or you are coughing up blood.  You have severe back or abdominal pain, feel nauseous, or vomit.  You develop severe weakness, fainting, or chills.  You have a fever. THIS IS AN EMERGENCY. Do not wait to see if the pain will go away. Get medical help at once. Call your local emergency services (911 in [place] Do not drive yourself to the hospital. MAKE SURE YOU:   Understand these instructions.  Will watch your condition.  Will get help right away if you are not doing well or get worse. Document Released: 11/14/2004 Document Revised: 04/29/2011 Document  Reviewed: 09/10/2007 [facility] Patient Information 2014 [place].

## 2013-05-14 NOTE — ED Notes (Signed)
MD aware of the elevated Trop.

## 2013-05-14 NOTE — ED Notes (Signed)
Pt reports that she started having midsternal chest pain that started this morning. States that she has a hx of CHF. Denies any n/v/sob with the pain. Skin warm and dry. Reports the pain started at rest

## 2013-05-17 ENCOUNTER — Encounter: Payer: Self-pay | Admitting: Internal Medicine

## 2013-05-17 ENCOUNTER — Encounter: Payer: Self-pay | Admitting: Cardiovascular Disease

## 2013-05-17 ENCOUNTER — Telehealth: Payer: Self-pay | Admitting: *Deleted

## 2013-05-17 NOTE — Telephone Encounter (Signed)
Pt daughter calls in upset ( b/c she sent a request 3 times to her pcp about having labs ordered by Dr. Burt Knack sent to [facility] neurologist) I calmly stated to pt that we did not receive these requests b/c she did not send them to our office.  Her mother has an office visit with Dr. Markham Jordan at [facility] neurologist. Daughter states she has spoken with Dr. Mallie Darting & she will have lab work drawn there & fax results to our office.  Fax # [phone]   faxed lab orders to their office & cancel appt for labs on 3/31 Horton Chin RN

## 2013-05-18 ENCOUNTER — Other Ambulatory Visit: Payer: Medicare Other

## 2013-05-18 ENCOUNTER — Telehealth: Payer: Self-pay | Admitting: *Deleted

## 2013-05-18 ENCOUNTER — Ambulatory Visit (INDEPENDENT_AMBULATORY_CARE_PROVIDER_SITE_OTHER): Payer: Medicare Other | Admitting: *Deleted

## 2013-05-18 DIAGNOSIS — I5032 Chronic diastolic (congestive) heart failure: Secondary | ICD-10-CM

## 2013-05-18 DIAGNOSIS — I509 Heart failure, unspecified: Secondary | ICD-10-CM

## 2013-05-18 DIAGNOSIS — I441 Atrioventricular block, second degree: Secondary | ICD-10-CM

## 2013-05-18 LAB — CBC WITH DIFFERENTIAL/PLATELET
BASOS ABS: 0 10*3/uL (ref 0.0–0.1)
Basophils Relative: 0.7 % (ref 0.0–3.0)
EOS ABS: 0 10*3/uL (ref 0.0–0.7)
Eosinophils Relative: 1.2 % (ref 0.0–5.0)
HEMATOCRIT: 38.1 % (ref 36.0–46.0)
HEMOGLOBIN: 12.4 g/dL (ref 12.0–15.0)
LYMPHS ABS: 1 10*3/uL (ref 0.7–4.0)
Lymphocytes Relative: 36.2 % (ref 12.0–46.0)
MCHC: 32.5 g/dL (ref 30.0–36.0)
MCV: 87.7 fl (ref 78.0–100.0)
Monocytes Absolute: 0.3 10*3/uL (ref 0.1–1.0)
Monocytes Relative: 11 % (ref 3.0–12.0)
NEUTROS ABS: 1.4 10*3/uL (ref 1.4–7.7)
Neutrophils Relative %: 50.9 % (ref 43.0–77.0)
Platelets: 245 10*3/uL (ref 150.0–400.0)
RBC: 4.34 Mil/uL (ref 3.87–5.11)
RDW: 15.1 % — AB (ref 11.5–14.6)
WBC: 2.7 10*3/uL — ABNORMAL LOW (ref 4.5–10.5)

## 2013-05-18 LAB — BASIC METABOLIC PANEL
BUN: 21 mg/dL (ref 6–23)
CHLORIDE: 100 meq/L (ref 96–112)
CO2: 29 meq/L (ref 19–32)
Calcium: 9.5 mg/dL (ref 8.4–10.5)
Creatinine, Ser: 1.6 mg/dL — ABNORMAL HIGH (ref 0.4–1.2)
GFR: 41.36 mL/min — ABNORMAL LOW (ref >60.00)
GLUCOSE: 85 mg/dL (ref 70–99)
POTASSIUM: 3.5 meq/L (ref 3.5–5.1)
SODIUM: 140 meq/L (ref 135–145)

## 2013-05-18 LAB — PROTIME-INR
INR: 1.1 ratio — ABNORMAL HIGH (ref 0.8–1.0)
Prothrombin Time: 11.8 s (ref 10.2–12.4)

## 2013-05-18 NOTE — Telephone Encounter (Signed)
Call from [facility] with [facility] [phone] She reports an elevated BP of 171/81, pt is asymptomatic. No c/o She just wanted you to be aware.  Pt is not on any BP meds. Ex. Lasix.  Last visit 2/23 in clinic and BP was 180/80  Please advise

## 2013-05-19 NOTE — Telephone Encounter (Signed)
Patient has severe Aortic stenosis and her antihypertensive meds were D/C'd except for Lasix.  Her cardiologist is now managing her CHF, AS and HTN. She is scheduled to follow up with the cardiology today and I am not sure why she cancelled the appt.   Please advise her to call her cardiologist office and schedule an appt as soon as possible. Please call her cardiologist office and fax over the [place] note on 04/12/13 and your note.   Thanks  Nicoletta Dress

## 2013-05-24 ENCOUNTER — Encounter (HOSPITAL_COMMUNITY)
Admission: RE | Disposition: A | Discharge status: Home / Self Care | Payer: Self-pay | Source: Ambulatory Visit | Attending: Cardiovascular Disease

## 2013-05-24 ENCOUNTER — Ambulatory Visit (HOSPITAL_COMMUNITY)
Admission: RE | Admit: 2013-05-24 | Discharge: 2013-05-24 | Disposition: A | Discharge status: Home / Self Care | Payer: Medicare Other | Source: Ambulatory Visit | Attending: Cardiovascular Disease | Admitting: Cardiovascular Disease

## 2013-05-24 DIAGNOSIS — R011 Cardiac murmur, unspecified: Secondary | ICD-10-CM | POA: Insufficient documentation

## 2013-05-24 DIAGNOSIS — Z993 Dependence on wheelchair: Secondary | ICD-10-CM | POA: Insufficient documentation

## 2013-05-24 DIAGNOSIS — M79609 Pain in unspecified limb: Secondary | ICD-10-CM | POA: Insufficient documentation

## 2013-05-24 DIAGNOSIS — I441 Atrioventricular block, second degree: Secondary | ICD-10-CM | POA: Insufficient documentation

## 2013-05-24 DIAGNOSIS — K219 Gastro-esophageal reflux disease without esophagitis: Secondary | ICD-10-CM | POA: Insufficient documentation

## 2013-05-24 DIAGNOSIS — Z6841 Body Mass Index (BMI) 40.0 and over, adult: Secondary | ICD-10-CM | POA: Insufficient documentation

## 2013-05-24 DIAGNOSIS — I5032 Chronic diastolic (congestive) heart failure: Secondary | ICD-10-CM | POA: Insufficient documentation

## 2013-05-24 DIAGNOSIS — R269 Unspecified abnormalities of gait and mobility: Secondary | ICD-10-CM | POA: Insufficient documentation

## 2013-05-24 DIAGNOSIS — Z8673 Personal history of transient ischemic attack (TIA), and cerebral infarction without residual deficits: Secondary | ICD-10-CM | POA: Insufficient documentation

## 2013-05-24 DIAGNOSIS — I129 Hypertensive chronic kidney disease with stage 1 through stage 4 chronic kidney disease, or unspecified chronic kidney disease: Secondary | ICD-10-CM | POA: Insufficient documentation

## 2013-05-24 DIAGNOSIS — I359 Nonrheumatic aortic valve disorder, unspecified: Secondary | ICD-10-CM

## 2013-05-24 DIAGNOSIS — E785 Hyperlipidemia, unspecified: Secondary | ICD-10-CM | POA: Insufficient documentation

## 2013-05-24 DIAGNOSIS — I2510 Atherosclerotic heart disease of native coronary artery without angina pectoris: Secondary | ICD-10-CM | POA: Insufficient documentation

## 2013-05-24 DIAGNOSIS — Z87891 Personal history of nicotine dependence: Secondary | ICD-10-CM | POA: Insufficient documentation

## 2013-05-24 DIAGNOSIS — I509 Heart failure, unspecified: Secondary | ICD-10-CM | POA: Insufficient documentation

## 2013-05-24 DIAGNOSIS — N183 Chronic kidney disease, stage 3 unspecified: Secondary | ICD-10-CM | POA: Insufficient documentation

## 2013-05-24 DIAGNOSIS — Z7982 Long term (current) use of aspirin: Secondary | ICD-10-CM | POA: Insufficient documentation

## 2013-05-24 DIAGNOSIS — G40909 Epilepsy, unspecified, not intractable, without status epilepticus: Secondary | ICD-10-CM | POA: Insufficient documentation

## 2013-05-24 HISTORY — PX: LEFT AND RIGHT HEART CATHETERIZATION WITH CORONARY ANGIOGRAM: SHX5449

## 2013-05-24 LAB — POCT I-STAT 3, VENOUS BLOOD GAS (G3P V)
ACID-BASE DEFICIT: 2 mmol/L (ref 0.0–2.0)
Bicarbonate: 24.4 mEq/L — ABNORMAL HIGH (ref 20.0–24.0)
O2 Saturation: 63 %
TCO2: 26 mmol/L (ref 0–100)
pCO2, Ven: 46.6 mmHg (ref 45.0–50.0)
pH, Ven: 7.326 — ABNORMAL HIGH (ref 7.250–7.300)
pO2, Ven: 36 mmHg (ref 30.0–45.0)

## 2013-05-24 LAB — BASIC METABOLIC PANEL
BUN: 16 mg/dL (ref 6–23)
CALCIUM: 9.4 mg/dL (ref 8.4–10.5)
CO2: 27 mEq/L (ref 19–32)
Chloride: 101 mEq/L (ref 96–112)
Creatinine, Ser: 1.42 mg/dL — ABNORMAL HIGH (ref 0.50–1.10)
GFR calc Af Amer: 44 mL/min — ABNORMAL LOW (ref >90)
GFR, EST NON AFRICAN AMERICAN: 38 mL/min — AB (ref >90)
Glucose, Bld: 85 mg/dL (ref 70–99)
Potassium: 3.8 mEq/L (ref 3.7–5.3)
SODIUM: 143 meq/L (ref 137–147)

## 2013-05-24 LAB — POCT ACTIVATED CLOTTING TIME: ACTIVATED CLOTTING TIME: 160 s

## 2013-05-24 LAB — POCT I-STAT 3, ART BLOOD GAS (G3+)
Acid-base deficit: 1 mmol/L (ref 0.0–2.0)
Bicarbonate: 24.1 mEq/L — ABNORMAL HIGH (ref 20.0–24.0)
O2 Saturation: 96 %
PH ART: 7.4 (ref 7.350–7.450)
TCO2: 25 mmol/L (ref 0–100)
pCO2 arterial: 39 mmHg (ref 35.0–45.0)
pO2, Arterial: 83 mmHg (ref 80.0–100.0)

## 2013-05-24 SURGERY — LEFT AND RIGHT HEART CATHETERIZATION WITH CORONARY ANGIOGRAM
Anesthesia: LOCAL

## 2013-05-24 MED ORDER — SODIUM CHLORIDE 0.9 % IJ SOLN: 3.0000 mL | INTRAMUSCULAR | Status: DC | PRN

## 2013-05-24 MED ORDER — ASPIRIN 81 MG PO CHEW: 81.0000 mg | CHEWABLE_TABLET | ORAL | Status: DC

## 2013-05-24 MED ORDER — HEPARIN SODIUM (PORCINE) 1000 UNIT/ML IJ SOLN
INTRAMUSCULAR | Status: AC
  Filled 2013-05-24: qty 1

## 2013-05-24 MED ORDER — OXYCODONE-ACETAMINOPHEN 5-325 MG PO TABS: 1.0000 | ORAL_TABLET | ORAL | Status: DC | PRN

## 2013-05-24 MED ORDER — MIDAZOLAM HCL 2 MG/2ML IJ SOLN
INTRAMUSCULAR | Status: AC
  Filled 2013-05-24: qty 2

## 2013-05-24 MED ORDER — HEPARIN (PORCINE) IN NACL 2-0.9 UNIT/ML-% IJ SOLN
INTRAMUSCULAR | Status: AC
  Filled 2013-05-24: qty 1000

## 2013-05-24 MED ORDER — HYDRALAZINE HCL 20 MG/ML IJ SOLN
10.0000 mg | Freq: Once | INTRAMUSCULAR | Status: DC
Start: 2013-05-24 — End: 2013-05-24
  Filled 2013-05-24: qty 1

## 2013-05-24 MED ORDER — SODIUM CHLORIDE 0.9 % IV SOLN: 250.0000 mL | INTRAVENOUS | Status: DC | PRN

## 2013-05-24 MED ORDER — NITROGLYCERIN 0.2 MG/ML ON CALL CATH LAB
INTRAVENOUS | Status: AC
  Filled 2013-05-24: qty 1

## 2013-05-24 MED ORDER — SODIUM CHLORIDE 0.9 % IJ SOLN: 3.0000 mL | Freq: Two times a day (BID) | INTRAMUSCULAR | Status: DC

## 2013-05-24 MED ORDER — SODIUM CHLORIDE 0.9 % IJ SOLN
3.0000 mL | Freq: Two times a day (BID) | INTRAMUSCULAR | Status: DC
Start: 2013-05-25 — End: 2013-05-24

## 2013-05-24 MED ORDER — HEPARIN (PORCINE) IN NACL 2-0.9 UNIT/ML-% IJ SOLN
INTRAMUSCULAR | Status: AC
  Filled 2013-05-24: qty 500

## 2013-05-24 MED ORDER — SODIUM CHLORIDE 0.9 % IV SOLN: INTRAVENOUS | Status: DC

## 2013-05-24 MED ORDER — VERAPAMIL HCL 2.5 MG/ML IV SOLN
INTRAVENOUS | Status: AC
  Filled 2013-05-24: qty 2

## 2013-05-24 MED ORDER — FENTANYL CITRATE 0.05 MG/ML IJ SOLN
INTRAMUSCULAR | Status: AC
  Filled 2013-05-24: qty 2

## 2013-05-24 MED ORDER — LIDOCAINE HCL (PF) 1 % IJ SOLN
INTRAMUSCULAR | Status: AC
  Filled 2013-05-24: qty 30

## 2013-05-24 NOTE — Interval H&P Note (Signed)
Cath Lab Visit (complete for each Cath Lab visit)  Clinical Evaluation Leading to the Procedure:   ACS: no  Non-ACS:    Anginal Classification: CCS III  Anti-ischemic medical therapy: Maximal Therapy (2 or more classes of medications)  Non-Invasive Test Results: No non-invasive testing performed  Prior CABG: No previous CABG      History and Physical Interval Note:  05/24/2013 7:39 AM  Sherry [name]  has presented today for surgery, with the diagnosis of Severe Aortic Stenosis  The various methods of treatment have been discussed with the patient and family. After consideration of risks, benefits and other options for treatment, the patient has consented to  Procedure(s): LEFT AND RIGHT HEART CATHETERIZATION WITH CORONARY ANGIOGRAM (N/A) as a surgical intervention .  The patient's history has been reviewed, patient examined, no change in status, stable for surgery.  I have reviewed the patient's chart and labs.  Questions were answered to the patient's satisfaction.     Sinclair Grooms

## 2013-05-24 NOTE — CV Procedure (Signed)
Left and Right  Heart Catheterization with Coronary Angiography  Report  Sherry [name]  68 y.o.  female January 03, 1946  Procedure Date: 05/24/2013 Referring Physician: Burt Knack, MD/ Roxy Manns, MD Primary Cardiologist: Burt Knack, MD  INDICATIONS: Aortic stenosis, critical by echocardiography. The studies being done to investigate pulmonary artery pressure and coronary artery  PROCEDURE: 1. Left heart catheterization; 2. Coronary angiography; 3. Right heart catheterization; 4. Left ventriculography  CONSENT:  The risks, benefits, and details of the procedure were explained in detail to the patient. Risks including death, stroke, heart attack, kidney injury, allergy, limb ischemia, bleeding and radiation injury were discussed.  The patient verbalized understanding and wanted to proceed.  Informed written consent was obtained.  PROCEDURE TECHNIQUE:  We attempted to perform the right heart catheterization via the left antecubital vein changing out an 18-gauge antecubital Angiocath. The antecubital vein into the into the subclavian at an acute angle and that prevented injury into the subclavian vein despite using an angioplasty guidewire to improve steerability. After approximately 15 minutes I decided to convert the case to a right femoral artery and vein approach.  After Xylocaine anesthesia a 5 [place] and 7 [place] sheaths was placed in the right femoral artery and vein with the modified Seldinger technique.  Coronary angiography was done using a 5 [place] JR 4 and JL 3.5 cm diagnostic catheters.  Left ventriculography was done using the 5 [place] JR 4 catheter and hand injection.   A 7 French Swan-Ganz catheter was used for hemodynamic recordings in the right heart and pulmonary arterial. Thermodilution cardiac outputs were performed. The main pulmonary artery and aortic O2 saturation was obtained.  Hemostasis was achieved with manual compression.   CONTRAST:  Total of 80 cc.  COMPLICATIONS:   Unable to advance the right heart catheter into the subclavian vein as outlined above   HEMODYNAMICS:  Aortic pressure 166/70 mmHg; LV pressure 195/12 mmHg; LVEDP 25 mm mercury; RA 7 mmHg; RV 35/7 mmHg; PA 34/12 mmHg; PCWP(mean) 17 mmHg; Cardiac Output 4.97 L per minute (Fick); 3.74 L per minute (thermodilution); Pull back across the aortic valve: left ventricular pressure 196/23 mmHg and aortic pressure 175/753 mmHg; AV gradient 21 mm mercury Peak-to-Peak.; Aortic valve area by thermodilution 1.43 cm square and by Fick 1.90 cm square  ANGIOGRAPHIC DATA:   The left main coronary artery is widely patent.  The left anterior descending artery has moderate to heavy proximal and mid calcification with tandem 80 and 50% stenoses noted after the large first septal perforator. The LAD wraps around the left ventricular apex..  The left circumflex artery is large, giving origin to 4 obtuse marginal branches. The second obtuse marginal is the dominant vessel and bifurcates on the mid lateral wall. The first obtuse marginal is small and contains ostial 70-80% narrowing. The mid circumflex contains an eccentric 80% stenosis within a heavily calcified segment. There is 30-50% narrowing in the ostium of the second obtuse marginal. The continuation of the circumflex beyond the second obtuse marginal contains segmental 85% stenosis before the origin of thE third and fourth obtuse marginalS.  The right coronary artery is dominant and contains severe segmental mid to distal disease with stenoses up to 90%.   LEFT VENTRICULOGRAM:  Left ventricular angiogram was done in the 30 RAO projection and revealed normal to small LV cavity with hyperdynamic contractility and an estimated ejection fraction of 70-80%. This is artificially higher than felt due to post PVC increase in contractility.   IMPRESSIONS:  1. Moderate to  severe three-vessel coronary disease as outlined above. The circumflex and right coronary are relatively  diffusely diseased.   2. Aortic stenosis, with a surprisingly low transvalvular gradient based on direct hemodynamics. This would raise a question about the possibility of contamination of the aortic velocities on echo rom dynamic LV outflow obstruction.  3. Normal to hyperdynamic LV function.  4. Normal PA pressures.   RECOMMENDATION:  Per Dr. Burt Knack and Dr. Roxy Manns.

## 2013-05-24 NOTE — H&P (View-Only) (Signed)
Patient ID: Sherry [name] MRN: [id number], DOB/AGE: September 28, 1945   Admit date: 05/14/2013   Primary Physician: Charlann Lange, MD Primary Cardiologist: Dr Jerilynn Mages. Cooper  HPI: 68 y/o AA female with severe AS and multiple comorbidities, presents to the ER this pm with chest pain. She was just evaluated 05/03/13 evaluated by Dr Nils Pyle for consideration of traditional AVR vs TAVR vs medical Rx. Her last echo showed her peak gradient to be 85 mmHg, mean 52 mmHg. She has preserved LVF. She has stage 3 CRI (l SCr 1.37 today), prior CVA/TIA, asymptomatic Mobitz 1 second degree AVB, seizure disorder, morbid obesity, and chronic lower extremity pain and immobility.  Dr Nils Pyle had suggested further work up including PFTs, PT evaluation, and Rt and Lt heart cath.  Today she developed a "tickle" in her chest. This gradually progressed to vague pressure. She denies any radiation to her jaw, increased dyspnea, or nausea. In the ER her Troponin POC is elevated 0.15, her Tropon I is negative x 1. It also should be noted that her B/P is elevated and her BNP is elevated 2373. She is currently comfortable but still complains of vague Lt sided chest discomfort.    Problem List: Past Medical History  Diagnosis Date  . Aortic stenosis     a. 06/2012 ECHO: severly calcified AV, with with mod AS by Doppler criteria. Sig LVH;  b. 01/2013 Echo: EF 60-65%, mod LVH, Sev AS (1.18cm^2 VTI, 1.35cm^2 Vmax), Severe MAC esp posteriorly, mildly dil LA, PASP 81mmHg.  [name] Hypertension   . Seizure disorder   . Hyperlipidemia   . Chronic diastolic heart failure     a. 06/2012 ECHO: EF Q000111Q, grd 1 distolic dysfxn, severly calcified AV, with with mod AS by Doppler criteria. Sig LVH  . CKD (chronic kidney disease)     a. baseline CKD stage III  . Morbid obesity   . Anemia   . Bilateral lower extremity edema   . Mobitz (type) I (Wenckebach's) atrioventricular block     a. 01/2013 - asymptomatic.  . Bradycardia     a. 01/2013 -  asymptomatic.  [name] Coronary artery disease   . Heart murmur   . GERD (gastroesophageal reflux disease)   . Seizures   . Headache(784.0)   . Arthritis   . TIA (transient ischemic attack) 2014    pt stated she had "mini strokes"    Past Surgical History  Procedure Laterality Date  . Abdominal hysterectomy       Allergies:  Allergies  Allergen Reactions  . Ibuprofen Other (See Comments)    Dr advised pt not to take ibuprofen     Home Medications No current facility-administered medications for this encounter.   Current Outpatient Prescriptions  Medication Sig Dispense Refill  . aspirin 81 MG EC tablet Take 81 mg by mouth daily.       . Calcium Carb-Cholecalciferol (CALCIUM 500 +D) 500-400 MG-UNIT TABS Take 1 tablet by mouth 2 (two) times daily.      . furosemide (LASIX) 80 MG tablet Take 80 mg by mouth daily.      [name] PHENObarbital (LUMINAL) 60 MG tablet Take 60 mg by mouth at bedtime. Take with 97.2mg  tab      . PHENobarbital (LUMINAL) 97.2 MG tablet Take 97.2 mg by mouth. Take with 60mg  tab      . pravastatin (PRAVACHOL) 40 MG tablet Take 40 mg by mouth daily.      . Vitamin D, Ergocalciferol, (DRISDOL) 50000  UNITS CAPS capsule Take 50,000 Units by mouth every 7 (seven) days. On Friday      . zonisamide (ZONEGRAN) 100 MG capsule Take 200 mg by mouth at bedtime.         FMHX- negative for early CAD  History   Social History  . Marital Status: Married    Spouse Name: N/A    Number of Children: N/A  . Years of Education: N/A   Occupational History  . Not on file.   Social History Main Topics  . Smoking status: Former Smoker    Types: Cigarettes    Quit date: 06/03/2000  . Smokeless tobacco: Not on file  . Alcohol Use: No  . Drug Use: No  . Sexual Activity: Not Currently   Other Topics Concern  . Not on file   Social History Narrative  . No narrative on file     Review of Systems: General: negative for chills, fever, night sweats or weight changes.    Cardiovascular: negative for chest pain, dyspnea on exertion, edema, orthopnea, palpitations, paroxysmal nocturnal dyspnea or shortness of breath Dermatological: negative for rash Respiratory: negative for cough or wheezing Urologic: negative for hematuria Abdominal: negative for nausea, vomiting, diarrhea, bright red blood per rectum, melena, or hematemesis Neurologic: negative for visual changes, syncope, or dizziness All other systems reviewed and are otherwise negative except as noted above.  Physical Exam: Blood pressure 187/88, pulse 65, temperature 98 F (36.7 C), temperature source Oral, resp. rate 16, height 5\' 1"  (1.549 m), weight 193 lb (87.544 kg), SpO2 99.00%.  General appearance: alert, cooperative, no distress and moderately obese Neck: transmitted murmur to carotids Lungs: clear to auscultation bilaterally Heart: regular rate and rhythm and 3/6 sytolic murmur AOv, with diminnished S2 Abdomen: obese, non tender Extremities: chronic edema, compression stockings in place Pulses: diminnished distal pulses Skin: cool and dry Neurologic: Grossly normal    Labs:   Results for orders placed during the hospital encounter of 05/14/13 (from the past 24 hour(s))  CBC     Status: Abnormal   Collection Time    05/14/13  2:55 PM      Result Value Ref Range   WBC 3.1 (*) 4.0 - 10.5 K/uL   RBC 4.25  3.87 - 5.11 MIL/uL   Hemoglobin 12.4  12.0 - 15.0 g/dL   HCT 36.8  36.0 - 46.0 %   MCV 86.6  78.0 - 100.0 fL   MCH 29.2  26.0 - 34.0 pg   MCHC 33.7  30.0 - 36.0 g/dL   RDW 14.0  11.5 - 15.5 %   Platelets 231  150 - 400 K/uL  BASIC METABOLIC PANEL     Status: Abnormal   Collection Time    05/14/13  2:55 PM      Result Value Ref Range   Sodium 141  137 - 147 mEq/L   Potassium 3.6 (*) 3.7 - 5.3 mEq/L   Chloride 100  96 - 112 mEq/L   CO2 26  19 - 32 mEq/L   Glucose, Bld 87  70 - 99 mg/dL   BUN 20  6 - 23 mg/dL   Creatinine, Ser 1.37 (*) 0.50 - 1.10 mg/dL   Calcium 9.5  8.4 -  10.5 mg/dL   GFR calc non Af Amer 39 (*) >90 mL/min   GFR calc Af Amer 45 (*) >90 mL/min  PRO B NATRIURETIC PEPTIDE     Status: Abnormal   Collection Time    05/14/13  2:55  PM      Result Value Ref Range   Pro B Natriuretic peptide (BNP) 2373.0 (*) 0 - 125 pg/mL  TROPONIN I     Status: None   Collection Time    05/14/13  2:55 PM      Result Value Ref Range   Troponin I <0.30  <0.30 ng/mL  I-STAT TROPOININ, ED     Status: Abnormal   Collection Time    05/14/13  3:08 PM      Result Value Ref Range   Troponin i, poc 0.15 (*) 0.00 - 0.08 ng/mL   Comment NOTIFIED PHYSICIAN     Comment 3              Radiology/Studies: No results found.  EKG:NSR, Ist degree AVB, LVH  ASSESSMENT AND PLAN:  Principal Problem:   Chest pain Active Problems:   Severe aortic stenosis   HTN- B/P up in ER   CKD (chronic kidney disease) stage 3, GFR 30-59 ml/min   Chronic diastolic congestive heart failure   Heart block AV second degree   SEIZURE DISORDER   Abnormality of gait- basically wheelchair bound   LEG EDEMA, BILATERAL   Morbid obesity with BMI of 40.0-44.9, adult   Multiple lacunar infarcts   PLAN: Will review with MD she is scheduled to have a cath 05/24/13. - ? Admit,  R/O MI, B/P control. Consider going ahead with cath Rt and Lt Monday.    Henri Medal, PA-C 05/14/2013, 5:09 PM  Patient seen and examined with Melina Copa, PA-C. We discussed all aspects of the encounter. I agree with the assessment and plan as stated above.   CP does not appear ischemic. Denies HF or syncope. She is at baseline. Main issue is severe HTN. Will give a dose of IV hydralazine and if we can get SBP 160-170 she should be stable for d/c from ER with outpatient f/u as scheduled.  Alois Colgan,MD 6:12 PM

## 2013-05-24 NOTE — Progress Notes (Signed)
Left antecubital venous site without unremarkable

## 2013-05-24 NOTE — Discharge Instructions (Signed)
Angiography, Care After °Refer to this sheet in the next few weeks. These instructions provide you with information on caring for yourself after your procedure. Your health care provider may also give you more specific instructions. Your treatment has been planned according to current medical practices, but problems sometimes occur. Call your health care provider if you have any problems or questions after your procedure.  °WHAT TO EXPECT AFTER THE PROCEDURE °After your procedure, it is typical to have the following sensations: °· Minor discomfort or tenderness and a small bump at the catheter insertion site. The bump should usually decrease in size and tenderness within 1 to 2 weeks. °· Any bruising will usually fade within 2 to 4 weeks. °HOME CARE INSTRUCTIONS  °· You may need to keep taking blood thinners if they were prescribed for you. Only take over-the-counter or prescription medicines for pain, fever, or discomfort as directed by your health care provider. °· Do not apply powder or lotion to the site. °· Do not sit in a bathtub, swimming pool, or whirlpool for 5 to 7 days. °· You may shower 24 hours after the procedure. Remove the bandage (dressing) and gently wash the site with plain soap and water. Gently pat the site dry. °· Inspect the site at least twice daily. °· Limit your activity for the first 48 hours. Do not bend, squat, or lift anything over 20 lb (9 kg) or as directed by your health care provider. °· Do not drive home if you are discharged the day of the procedure. Have someone else drive you. Follow instructions about when you can drive or return to work. °SEEK MEDICAL CARE IF: °· You get lightheaded when standing up. °· You have drainage (other than a small amount of blood on the dressing). °· You have chills. °· You have a fever. °· You have redness, warmth, swelling, or pain at the insertion site. °SEEK IMMEDIATE MEDICAL CARE IF:  °· You develop chest pain or shortness of breath, feel faint,  or pass out. °· You have bleeding, swelling larger than a walnut, or drainage from the catheter insertion site. °· You develop pain, discoloration, coldness, or severe bruising in the leg or arm that held the catheter. °· You develop bleeding from any other place, such as the bowels. You may see bright red blood in your urine or stools, or your stools may appear black and tarry. °· You have heavy bleeding from the site. If this happens, hold pressure on the site. °MAKE SURE YOU: °· Understand these instructions. °· Will watch your condition. °· Will get help right away if you are not doing well or get worse. °Document Released: 08/23/2004 Document Revised: 10/07/2012 Document Reviewed: 06/29/2012 °[employer]® Patient Information ©2014 [employer]. ° °

## 2013-05-24 NOTE — Progress Notes (Signed)
DR Tamala Julian NOTIFIED OF B/P RUNNING Q000111Q SYSTOLIC AND ORDER NOTED

## 2013-05-27 NOTE — Telephone Encounter (Signed)
Pt admitted for Cardiac Cath on 4/6.  Seen by Dr Linard Millers and Dr Ezzie Dural.  She has a f/u with them.

## 2013-05-27 NOTE — Telephone Encounter (Signed)
Thanks  Nicoletta Dress

## 2013-05-31 ENCOUNTER — Ambulatory Visit: Payer: Medicare Other | Attending: Thoracic Surgery (Cardiothoracic Vascular Surgery) | Admitting: Physical Therapy

## 2013-05-31 DIAGNOSIS — IMO0001 Reserved for inherently not codable concepts without codable children: Secondary | ICD-10-CM | POA: Insufficient documentation

## 2013-05-31 DIAGNOSIS — I359 Nonrheumatic aortic valve disorder, unspecified: Secondary | ICD-10-CM | POA: Insufficient documentation

## 2013-05-31 DIAGNOSIS — M6281 Muscle weakness (generalized): Secondary | ICD-10-CM | POA: Insufficient documentation

## 2013-06-14 ENCOUNTER — Encounter: Payer: Self-pay | Admitting: Cardiovascular Disease

## 2013-06-14 ENCOUNTER — Ambulatory Visit (INDEPENDENT_AMBULATORY_CARE_PROVIDER_SITE_OTHER): Payer: Medicare Other | Admitting: Cardiovascular Disease

## 2013-06-14 VITALS — BP 140/82 | HR 62 | Ht 61.0 in | Wt 190.0 lb

## 2013-06-14 DIAGNOSIS — I359 Nonrheumatic aortic valve disorder, unspecified: Secondary | ICD-10-CM

## 2013-06-14 NOTE — Patient Instructions (Signed)
Your physician wants you to follow-up in: 4 MONTHS with Dr Cooper.  You will receive a reminder letter in the mail two months in advance. If you don't receive a letter, please call our office to schedule the follow-up appointment.  Your physician recommends that you continue on your current medications as directed. Please refer to the Current Medication list given to you today.  

## 2013-06-14 NOTE — Progress Notes (Signed)
HPI:  68 year old woman presenting for followup cardiac evaluation. She was last seen here in February 2015. The patient has been followed for severe aortic stenosis and intermittent 2-1 heart block. She has been hospitalized twice with syncope/presyncope. Her first hospitalization in December 2014 was associated with 2-1 AV block. Her beta blocker and calcium channel blocker were discontinued at that time. She was evaluated by electrophysiology and recommendations were made for continued observation. She presented again in January of this year with pre-syncope. She did have episodic 2-1 AV block again.  Her conduction disease again was considered to be "benign" because of intermittent first degree and Mobitz 1 block. She has undergone further evaluation for severe aortic stenosis. An echocardiogram has demonstrated progressive aortic stenosis with peak and mean transaortic gradient of 85 and 52 mmHg, respectively. She then underwent cardiac catheterization which surprisingly demonstrated relatively low transaortic valve gradients. However, three-vessel coronary artery disease was demonstrated with severe stenoses of the right coronary artery and left circumflex and at least moderate stenosis of the mid LAD. She presents today for further discussion.  The patient remains remarkably asymptomatic. She reports no symptoms of chest pain, shortness of breath, lightheadedness, edema, or syncope. Her primary limitation relates to leg weakness. She can stand up for less than 1 minute. She really cannot walk much at all. She does ambulate very short distances in her house with the assistance of a walker. She does not walk outside.  Outpatient Encounter Prescriptions as of 06/14/2013  Medication Sig  . aspirin 81 MG EC tablet Take 81 mg by mouth daily.   . Calcium Carb-Cholecalciferol (CALCIUM 500 +D) 500-400 MG-UNIT TABS Take 1 tablet by mouth 2 (two) times daily.  . furosemide (LASIX) 80 MG tablet Take 80 mg by  mouth daily.  [name] PHENObarbital (LUMINAL) 60 MG tablet Take 2 tabs daily  . pravastatin (PRAVACHOL) 40 MG tablet Take 40 mg by mouth daily.  . Vitamin D, Ergocalciferol, (DRISDOL) 50000 UNITS CAPS capsule Take 50,000 Units by mouth every 7 (seven) days. On Friday  . zonisamide (ZONEGRAN) 100 MG capsule Take 200 mg by mouth at bedtime.  . [DISCONTINUED] PHENobarbital (LUMINAL) 97.2 MG tablet Take 97.2 mg by mouth. Take with 60mg  tab    Allergies  Allergen Reactions  . Ibuprofen Other (See Comments)    Dr advised pt not to take ibuprofen    Past Medical History  Diagnosis Date  . Aortic stenosis 12/14    Severe- mean grad 52, peak 85  . Hypertension   . Seizure disorder   . Hyperlipidemia   . Chronic diastolic heart failure   . CKD (chronic kidney disease)     a. baseline CKD stage III  . Morbid obesity   . Anemia   . Bilateral lower extremity edema   . Mobitz (type) I (Wenckebach's) atrioventricular block     a. 01/2013 - asymptomatic.  . Bradycardia     a. 01/2013 - asymptomatic.  [name] Coronary artery disease   . Heart murmur   . GERD (gastroesophageal reflux disease)   . Seizures   . Headache(784.0)   . Arthritis   . TIA (transient ischemic attack) 2014    pt stated she had "mini strokes"    ROS: Negative except as per HPI  BP 140/82  Pulse 62  Ht 5\' 1"  (1.549 m)  Wt 190 lb (86.183 kg)  BMI 35.92 kg/m2  PHYSICAL EXAM: Pt is alert and oriented, NAD. Patient is in a wheelchair. HEENT: normal  Neck: JVP - normal, carotids 2+= without bruits Lungs: CTA bilaterally CV: RRR with grade 3/6 harsh crescendo decrescendo murmur at the right upper sternal border Abd: soft, NT, Positive BS, no hepatomegaly Ext: no C/C/E, distal pulses intact and equal Skin: warm/dry no rash  2D Echo 02/16/2013: Left ventricle: The cavity size was mildly dilated. Wall thickness was increased in a pattern of moderate LVH. Systolic function was normal. The estimated ejection fraction was in  the range of 60% to 65%.  ------------------------------------------------------------ Aortic valve: Severely calcified leaflets. Doppler: There was severe stenosis. VTI ratio of LVOT to aortic valve: 0.37. Valve area: 1.18cm^2(VTI). Indexed valve area: 0.57cm^2/m^2 (VTI). Peak velocity ratio of LVOT to aortic valve: 0.43. Valve area: 1.35cm^2 (Vmax). Indexed valve area: 0.65cm^2/m^2 (Vmax). Mean gradient: 66mm Hg (S). Peak gradient: 59mm Hg (S).  ------------------------------------------------------------ Mitral valve: Severe MAC especially posteriorly Doppler: Trivial regurgitation. Peak gradient: 33mm Hg (D).  ------------------------------------------------------------ Left atrium: The atrium was mildly dilated.  ------------------------------------------------------------ Atrial septum: No defect or patent foramen ovale was identified.  ------------------------------------------------------------ Right ventricle: The cavity size was normal. Wall thickness was normal. Systolic function was normal.  ------------------------------------------------------------ Pulmonic valve: Structurally normal valve. Cusp separation was normal. Doppler: Transvalvular velocity was within the normal range. Trivial regurgitation.  ------------------------------------------------------------ Tricuspid valve: Structurally normal valve. Leaflet separation was normal. Doppler: Transvalvular velocity was within the normal range. Mild regurgitation.  ------------------------------------------------------------ Right atrium: The atrium was normal in size.  ------------------------------------------------------------ Pericardium: A trivial pericardial effusion was identified.  Cardiac Cath 05/24/2013: CONTRAST: Total of 80 cc.  COMPLICATIONS: Unable to advance the right heart catheter into the subclavian vein as outlined above  HEMODYNAMICS: Aortic pressure 166/70 mmHg; LV pressure 195/12 mmHg;  LVEDP 25 mm mercury; RA 7 mmHg; RV 35/7 mmHg; PA 34/12 mmHg; PCWP(mean) 17 mmHg; Cardiac Output 4.97 L per minute (Fick); 3.74 L per minute (thermodilution); Pull back across the aortic valve: left ventricular pressure 196/23 mmHg and aortic pressure 175/753 mmHg; AV gradient 21 mm mercury Peak-to-Peak.; Aortic valve area by thermodilution 1.43 cm square and by Fick 1.90 cm square  ANGIOGRAPHIC DATA: The left main coronary artery is widely patent.  The left anterior descending artery has moderate to heavy proximal and mid calcification with tandem 80 and 50% stenoses noted after the large first septal perforator. The LAD wraps around the left ventricular apex..  The left circumflex artery is large, giving origin to 4 obtuse marginal branches. The second obtuse marginal is the dominant vessel and bifurcates on the mid lateral wall. The first obtuse marginal is small and contains ostial 70-80% narrowing. The mid circumflex contains an eccentric 80% stenosis within a heavily calcified segment. There is 30-50% narrowing in the ostium of the second obtuse marginal. The continuation of the circumflex beyond the second obtuse marginal contains segmental 85% stenosis before the origin of thE third and fourth obtuse marginalS.  The right coronary artery is dominant and contains severe segmental mid to distal disease with stenoses up to 90%.  LEFT VENTRICULOGRAM: Left ventricular angiogram was done in the 30 RAO projection and revealed normal to small LV cavity with hyperdynamic contractility and an estimated ejection fraction of 70-80%. This is artificially higher than felt due to post PVC increase in contractility.  IMPRESSIONS: 1. Moderate to severe three-vessel coronary disease as outlined above. The circumflex and right coronary are relatively diffusely diseased.  2. Aortic stenosis, with a surprisingly low transvalvular gradient based on direct hemodynamics. This would raise a question about the possibility of  contamination of  the aortic velocities on echo rom dynamic LV outflow obstruction.  3. Normal to hyperdynamic LV function.  4. Normal PA pressures.  RECOMMENDATION: Per Dr. Burt Knack and Dr. Roxy Manns.   ASSESSMENT AND PLAN: 1. Severe aortic stenosis 2. Three-vessel coronary artery disease 3. Intermittent AV block 4. Stage 3-4 chronic kidney disease 5. History of stroke  While the patient remains remarkably asymptomatic, she has three-vessel coronary artery disease and severe aortic stenosis. She has particularly severe stenosis of the RCA which covers a large distribution. She also has at least moderately severe disease of the left circumflex and LAD. Despite her lack of symptoms, I think her risks of a major cardiac event or even sudden death are relatively high with the combination of severe coronary artery disease, severe aortic stenosis, and intermittent conduction disease. From a pure cardiac perspective, I think CABG/aortic valve replacement would be her most definitive treatment. However, as previously noted her risk of a prolonged recovery from open cardiac surgery is increased in the setting of her previous stroke, generalized weakness, and very limited mobility. With any intervention on her aortic valve, I believe her risk of postoperative pacemaker dependence is extremely high and I think she would benefit from reevaluation by our electrophysiology team prior to any aortic valve surgery. Of note, she has undergone a 30 day event monitor that showed no further evidence of high-grade AV block.  We will discuss her case in detail with the multidisciplinary valve team and then refer back to Dr Roxy Manns for consideration of further treatment options. In the meantime, will refer her to Dr Rayann Heman for EP evaluation in the context of likely aortic valve surgery.  I have reviewed echo and cath films, event monitor results, and extensive clinical notes from multiple providers as part of her evaluation today.  Time spent between chart review and patient great = 45 minutes with greater than 50% of that time in direct discussion with the patient and her daughter.  Sherren Mocha 06/14/2013 3:24 PM

## 2013-06-28 ENCOUNTER — Encounter: Payer: Self-pay | Admitting: Thoracic Surgery (Cardiothoracic Vascular Surgery)

## 2013-06-28 ENCOUNTER — Ambulatory Visit (INDEPENDENT_AMBULATORY_CARE_PROVIDER_SITE_OTHER): Payer: Medicare Other | Admitting: Thoracic Surgery (Cardiothoracic Vascular Surgery)

## 2013-06-28 VITALS — BP 186/81 | HR 81 | Resp 16 | Ht 61.0 in | Wt 190.0 lb

## 2013-06-28 DIAGNOSIS — I2510 Atherosclerotic heart disease of native coronary artery without angina pectoris: Secondary | ICD-10-CM | POA: Insufficient documentation

## 2013-06-28 DIAGNOSIS — I350 Nonrheumatic aortic (valve) stenosis: Secondary | ICD-10-CM

## 2013-06-28 DIAGNOSIS — I359 Nonrheumatic aortic valve disorder, unspecified: Secondary | ICD-10-CM

## 2013-06-28 NOTE — Patient Instructions (Signed)
Notify both Dr. Burt Knack and myself should he develop any symptoms of dizziness, chest discomfort, or shortness of breath

## 2013-06-28 NOTE — Progress Notes (Signed)
[address]       [place] [address]             [place] OFFICE NOTE  Referring Provider is Sherren Mocha, MD PCP is Nicoletta Dress, NA, MD   HPI:  Patient returns for followup of severe aortic stenosis. She has a complicated past medical history with numerous comorbid medical problems that would substantially increased the risks associated with conventional surgery.  She originally presented with symptoms of dizziness and syncope associated with intermittent 2:1 AV block. She was felt to have Mobitz type I AV block. and a decision was made to observe her without placement of a permanent pacemaker.  She was found to have severe aortic stenosis with normal left ventricular systolic function for which she was evaluated by Dr. Burt Knack and subsequently referred for surgical consultation. She was originally seen in consultation on 05/03/2013. Since then she underwent left and right heart catheterization by Dr. Tamala Julian.  At catheterization she was found to have severe three-vessel coronary artery disease with particularly high-grade stenosis involving the right coronary artery. Transvalvular gradients measured at cath were somewhat lower than that found on transthoracic echocardiogram. She has been seen in followup by Dr. Burt Knack recently and she returns to our office today for further followup.  She has not had any further episodes of dizziness or near-syncope. She has never had any chest pain or chest tightness either with activity or at rest. At present she remains remarkably asymptomatic. She states that she will only gets short of breath if she really pushes herself strenuously. Moreover, over the past month she has made significant improvement in her physical mobility. She has remained on a strenuous diet and continues to lose weight. She is now able to get herself up and go to the bathroom without assistance. She is ambulating some using a walker. She  states that her strength continues to improve.  She attributes her in improved mobility to weight loss and her determination to increase her activity.   Current Outpatient Prescriptions  Medication Sig Dispense Refill  . aspirin 81 MG EC tablet Take 81 mg by mouth daily.       . Calcium Carb-Cholecalciferol (CALCIUM 500 +D) 500-400 MG-UNIT TABS Take 1 tablet by mouth 2 (two) times daily.      . furosemide (LASIX) 80 MG tablet Take 80 mg by mouth daily.      [name] PHENObarbital (LUMINAL) 60 MG tablet Take 2 tabs daily      . Vitamin D, Ergocalciferol, (DRISDOL) 50000 UNITS CAPS capsule Take 50,000 Units by mouth every 7 (seven) days. On Friday      . zonisamide (ZONEGRAN) 100 MG capsule Take 200 mg by mouth at bedtime.      . pravastatin (PRAVACHOL) 40 MG tablet Take 40 mg by mouth daily.       No current facility-administered medications for this visit.      Physical Exam:   BP 186/81  Pulse 81  Resp 16  Ht 5\' 1"  (1.549 m)  Wt 190 lb (86.183 kg)  BMI 35.92 kg/m2  SpO2 98%  General:  Well-appearing  Chest:   Clear to auscultation  CV:   Regular rate and rhythm with harsh systolic murmur  Incisions:  n/a  Abdomen:  Soft and nontender  Extremities:  Warm and well-perfused, no edema  Diagnostic Tests:  Left and Right Heart Catheterization with Coronary Angiography Report   Sherry [name]  68 y.o.  female  1945/10/05  Procedure Date: 05/24/2013  Referring Physician: Burt Knack, MD/ Roxy Manns, MD  Primary Cardiologist: Burt Knack, MD  INDICATIONS: Aortic stenosis, critical by echocardiography. The studies being done to investigate pulmonary artery pressure and coronary artery  PROCEDURE: 1. Left heart catheterization; 2. Coronary angiography; 3. Right heart catheterization; 4. Left ventriculography  CONSENT:  The risks, benefits, and details of the procedure were explained in detail to the patient. Risks including death, stroke, heart attack, kidney injury, allergy, limb ischemia, bleeding  and radiation injury were discussed. The patient verbalized understanding and wanted to proceed. Informed written consent was obtained.  PROCEDURE TECHNIQUE: We attempted to perform the right heart catheterization via the left antecubital vein changing out an 18-gauge antecubital Angiocath. The antecubital vein into the into the subclavian at an acute angle and that prevented injury into the subclavian vein despite using an angioplasty guidewire to improve steerability. After approximately 15 minutes I decided to convert the case to a right femoral artery and vein approach.  After Xylocaine anesthesia a 5 [place] and 7 [place] sheaths was placed in the right femoral artery and vein with the modified Seldinger technique. Coronary angiography was done using a 5 [place] JR 4 and JL 3.5 cm diagnostic catheters. Left ventriculography was done using the 5 [place] JR 4 catheter and hand injection.  A 7 French Swan-Ganz catheter was used for hemodynamic recordings in the right heart and pulmonary arterial. Thermodilution cardiac outputs were performed. The main pulmonary artery and aortic O2 saturation was obtained.  Hemostasis was achieved with manual compression.  CONTRAST: Total of 80 cc.  COMPLICATIONS: Unable to advance the right heart catheter into the subclavian vein as outlined above  HEMODYNAMICS: Aortic pressure 166/70 mmHg; LV pressure 195/12 mmHg; LVEDP 25 mm mercury; RA 7 mmHg; RV 35/7 mmHg; PA 34/12 mmHg; PCWP(mean) 17 mmHg; Cardiac Output 4.97 L per minute (Fick); 3.74 L per minute (thermodilution); Pull back across the aortic valve: left ventricular pressure 196/23 mmHg and aortic pressure 175/753 mmHg; AV gradient 21 mm mercury Peak-to-Peak.; Aortic valve area by thermodilution 1.43 cm square and by Fick 1.90 cm square  ANGIOGRAPHIC DATA: The left main coronary artery is widely patent.  The left anterior descending artery has moderate to heavy proximal and mid calcification with tandem 80 and 50%  stenoses noted after the large first septal perforator. The LAD wraps around the left ventricular apex..  The left circumflex artery is large, giving origin to 4 obtuse marginal branches. The second obtuse marginal is the dominant vessel and bifurcates on the mid lateral wall. The first obtuse marginal is small and contains ostial 70-80% narrowing. The mid circumflex contains an eccentric 80% stenosis within a heavily calcified segment. There is 30-50% narrowing in the ostium of the second obtuse marginal. The continuation of the circumflex beyond the second obtuse marginal contains segmental 85% stenosis before the origin of thE third and fourth obtuse marginalS.  The right coronary artery is dominant and contains severe segmental mid to distal disease with stenoses up to 90%.  LEFT VENTRICULOGRAM: Left ventricular angiogram was done in the 30 RAO projection and revealed normal to small LV cavity with hyperdynamic contractility and an estimated ejection fraction of 70-80%. This is artificially higher than felt due to post PVC increase in contractility.  IMPRESSIONS: 1. Moderate to severe three-vessel coronary disease as outlined above. The circumflex and right coronary are relatively diffusely diseased.  2. Aortic stenosis, with a surprisingly low transvalvular gradient based on direct hemodynamics. This  would raise a question about the possibility of contamination of the aortic velocities on echo rom dynamic LV outflow obstruction.  3. Normal to hyperdynamic LV function.  4. Normal PA pressures.  RECOMMENDATION: Per Dr. Burt Knack and Dr. Roxy Manns.    Impression:  The patient has severe aortic stenosis with normal left ventricular systolic function. She also has severe three-vessel coronary artery disease with particular high-grade stenosis involving the right coronary artery.  She originally presented with symptoms of dizziness and presyncope in the setting of 2:1 AV block. This has not recurred and she  recently underwent and event monitor that reportedly demonstrated no evidence of high-grade AV blook.  She otherwise remains essentially asymptomatic with no history of chest discomfort and shortness of breath that develops only with very strenuous exertion.  I am particularly impressed by the fact that over the past 6 weeks the patient has done remarkably well on medical therapy and she has made considerable improvements in her overall strength and physical mobility.  After extensive review of the patient's echocardiogram and cardiac catheterization, I feel that the patient would best be treated with conventional surgical aortic valve replacement and coronary artery bypass grafting. Risks associated with surgery will be fairly high but likely not insurmountable.  Because the patient remains essentially asymptomatic at this time alternatives include either proceeding with surgery in the immediate future or watching her closely on medical therapy.  Given the history of intermittent conduction disease and multivessel CAD with high-grade stenosis of the right coronary artery, there may be a small but significant risk of acute cardiac event or perhaps even sudden death.   Plan:  I discussed options at length with the patient and her daughter here in the office today. Given the fact that she continues to make significant improvement with her physical rehabilitation and mobility, they are both inclined to hold off on surgery in the immediate future. We will plan to have him return in 3 months for followup. They have been counseled at length regarding size to be concerned about including any development of symptoms of dizzy spells, chest discomfort, or exertional shortness of breath.  All of their questions have been addressed.   I spent in excess of 15 minutes during the conduct of this office consultation and >50% of this time involved direct face-to-face encounter with the patient for counseling and/or  coordination of their care.   Sherry [name]. Roxy Manns, MD 06/28/2013 11:27 AM

## 2013-07-23 ENCOUNTER — Other Ambulatory Visit: Payer: Self-pay

## 2013-07-26 ENCOUNTER — Ambulatory Visit (INDEPENDENT_AMBULATORY_CARE_PROVIDER_SITE_OTHER): Payer: Medicare Other | Admitting: Internal Medicine

## 2013-07-26 ENCOUNTER — Encounter: Payer: Self-pay | Admitting: Internal Medicine

## 2013-07-26 VITALS — BP 180/77 | HR 50 | Ht 61.0 in | Wt 185.0 lb

## 2013-07-26 DIAGNOSIS — I359 Nonrheumatic aortic valve disorder, unspecified: Secondary | ICD-10-CM

## 2013-07-26 DIAGNOSIS — I350 Nonrheumatic aortic (valve) stenosis: Secondary | ICD-10-CM

## 2013-07-26 DIAGNOSIS — I441 Atrioventricular block, second degree: Secondary | ICD-10-CM

## 2013-07-26 DIAGNOSIS — I119 Hypertensive heart disease without heart failure: Secondary | ICD-10-CM

## 2013-07-26 NOTE — Patient Instructions (Signed)
Your physician recommends that you continue on your current medications as directed. Please refer to the Current Medication list given to you today.  No follow up is needed at this time with Dr. Rayann Heman.  Please follow up with Dr. Burt Knack as scheduled.

## 2013-07-30 ENCOUNTER — Encounter: Payer: Self-pay | Admitting: Internal Medicine

## 2013-07-30 NOTE — Progress Notes (Signed)
Primary Care Physician: Arman Filter, MD Referring Physician:  Dr Joni Reining is a 68 y.o. female with a h/o multiple chronic comorbidities including severe AS and advanced AV block who presents for EP follow-up.  I have seen her previously in the hospital for consultation.  Initially pacemaker was advised.  After subsequent evaluation by Dr Caryl Comes, a more conservative approach was decided upon.  She has actually done quite well without a pacemaker.  Recent event monitoring by Dr Burt Knack did not reveal significant AV block and any prolonged pauses.  She is quite limited in activity at baseline.  She was recently evaluated in the valve clinic and medical therapy with watchful waiting was recommended.  Today, she denies symptoms of palpitations, chest pain, shortness of breath, orthopnea, PND, dizziness, presyncope, syncope, or neurologic sequela. The patient is tolerating medications without difficulties and is otherwise without complaint today.   Past Medical History  Diagnosis Date  . Aortic stenosis 12/14    Severe- mean grad 52, peak 85  . Hypertension   . Seizure disorder   . Hyperlipidemia   . Chronic diastolic heart failure   . CKD (chronic kidney disease)     a. baseline CKD stage III  . Morbid obesity   . Anemia   . Bilateral lower extremity edema   . Mobitz (type) I (Wenckebach's) atrioventricular block     a. 01/2013 - asymptomatic.  . Bradycardia     a. 01/2013 - asymptomatic.  [name] Coronary artery disease   . Heart murmur   . GERD (gastroesophageal reflux disease)   . Seizures   . Headache(784.0)   . Arthritis   . TIA (transient ischemic attack) 2014    pt stated she had "mini strokes"   Past Surgical History  Procedure Laterality Date  . Abdominal hysterectomy      Current Outpatient Prescriptions  Medication Sig Dispense Refill  . aspirin 81 MG EC tablet Take 81 mg by mouth daily.       . Calcium Carb-Cholecalciferol (CALCIUM 500 +D) 500-400  MG-UNIT TABS Take 1 tablet by mouth 2 (two) times daily.      . furosemide (LASIX) 80 MG tablet Take 80 mg by mouth daily.      [name] PHENobarbital (LUMINAL) 64.8 MG tablet Take 129.6 mg by mouth daily.      . pravastatin (PRAVACHOL) 40 MG tablet Take 40 mg by mouth daily.      . Vitamin D, Ergocalciferol, (DRISDOL) 50000 UNITS CAPS capsule Take 50,000 Units by mouth every 7 (seven) days. On Friday      . zonisamide (ZONEGRAN) 100 MG capsule Take 200 mg by mouth at bedtime.       No current facility-administered medications for this visit.    Allergies  Allergen Reactions  . Ibuprofen Other (See Comments)    Dr advised pt not to take ibuprofen    History   Social History  . Marital Status: Married    Spouse Name: N/A    Number of Children: N/A  . Years of Education: N/A   Occupational History  . Not on file.   Social History Main Topics  . Smoking status: Former Smoker    Types: Cigarettes    Quit date: 06/03/2000  . Smokeless tobacco: Not on file  . Alcohol Use: No  . Drug Use: No  . Sexual Activity: Not Currently   Other Topics Concern  . Not on file   Social History Narrative  . No narrative  on file    Family History  Problem Relation Age of Onset  . Hypertension      ROS- All systems are reviewed and negative except as per the HPI above  Physical Exam: Filed Vitals:   07/26/13 1042  BP: 180/77  Pulse: 50  Height: 5\' 1"  (1.549 m)  Weight: 185 lb (83.915 kg)    GEN- The patient is chronically ill appearing, alert and oriented x 3 today.  In a wheelchair today Head- normocephalic, atraumatic Eyes-  Sclera clear, conjunctiva pink Ears- hearing intact Oropharynx- clear Neck- supple, Lungs- Clear to ausculation bilaterally, normal work of breathing Heart- Regular rate and rhythm, 2/6 SEM LUSB which is late peaking GI- soft, NT, ND, + BS Extremities- no clubbing, cyanosis, + edema MS- no significant deformity or atrophy Skin- no rash or lesion Psych-  euthymic mood, full affect Neuro- strength and sensation are intact  ekg today reveals sinus bradycardia 47 bpm, LVH  Event monitor is reviewed Epic records including Dr Roxy Manns and Dr York Cerise notes are reviewed  Assessment and Plan:  1. AV block The patient has previously been documented to have advanced AV block.  This is likely due to degenerative conduction disease accompanying her severe AS.  She has done well with a conservative strategy.  She would like to avoid PPM at this time.  Given her preferences we will follow with a watchful waiting approach.  She is instructed to contact our office immediately should she become symptomatic, develop presyncope/syncope.  2. Severe AS Managed in the valve clinic Though she will likely require surgery eventually, plan is a conservative strategy for now.  3. Hypertensive heart disease She reports that her blood pressure is low at times.  She is reluctant to make changes today.  Salt restriction and avoidance of NSAIDs is encouraged  She will follow-up with Dr Burt Knack I will see as needed going foward

## 2013-08-30 ENCOUNTER — Encounter (HOSPITAL_BASED_OUTPATIENT_CLINIC_OR_DEPARTMENT_OTHER): Payer: Medicare Other | Attending: Plastic Surgery

## 2013-08-30 DIAGNOSIS — M79609 Pain in unspecified limb: Secondary | ICD-10-CM | POA: Diagnosis not present

## 2013-09-20 ENCOUNTER — Encounter: Payer: Self-pay | Admitting: Physician Assistant

## 2013-09-20 ENCOUNTER — Ambulatory Visit (INDEPENDENT_AMBULATORY_CARE_PROVIDER_SITE_OTHER): Payer: Medicare Other | Admitting: Physician Assistant

## 2013-09-20 VITALS — BP 152/92 | HR 58 | Ht 61.0 in | Wt 176.0 lb

## 2013-09-20 DIAGNOSIS — I5032 Chronic diastolic (congestive) heart failure: Secondary | ICD-10-CM

## 2013-09-20 DIAGNOSIS — I350 Nonrheumatic aortic (valve) stenosis: Secondary | ICD-10-CM

## 2013-09-20 DIAGNOSIS — I359 Nonrheumatic aortic valve disorder, unspecified: Secondary | ICD-10-CM

## 2013-09-20 DIAGNOSIS — I2584 Coronary atherosclerosis due to calcified coronary lesion: Secondary | ICD-10-CM

## 2013-09-20 DIAGNOSIS — I509 Heart failure, unspecified: Secondary | ICD-10-CM

## 2013-09-20 DIAGNOSIS — I2510 Atherosclerotic heart disease of native coronary artery without angina pectoris: Secondary | ICD-10-CM

## 2013-09-20 NOTE — Patient Instructions (Signed)
Your physician recommends that you continue on your current medications as directed. Please refer to the Current Medication list given to you today.  Your physician recommends that you schedule a follow-up appointment in: 3 months DR COOPER ONLY

## 2013-09-20 NOTE — Assessment & Plan Note (Signed)
Severe three-vessel disease that will require CABG. Being followed closely by Dr. Roxy Manns. Patient asymptomatic at this time.

## 2013-09-20 NOTE — Progress Notes (Signed)
HPI: This is a very complicated female patient of Dr. Rayann Heman and Dr. Burt Knack who has severe AS with normal systolic function and advanced AV block that initially pacemaker was advised that a more conservative approach was decided upon and she has done quite well without a pacemaker. Right and left heart catheterization showed severe three-vessel CAD with particularly high grade stenosis involving the right coronary artery. Transvalvular gradients measured at cath were somewhat lower than that found on transthoracic echo. The patient will eventually require surgery but has multiple comorbid conditions. She is being followed closely with by Dr.Owen. She essentially remains asymptomatic at this time and would like to hold off on surgery as long as possible.  She is here today for routine followup. The daughter requests that she not have an EKG because of excessive bills that continue to grow. She denies any change in symptoms. She is trying to gain strength on a daily basis. She denies any chest pain, palpitations, dyspnea, dyspnea on exertion unless she does strenuous exertion, palpitations, dizziness or presyncope.    Allergies-- Ibuprofen -- Other (See Comments)   --  Dr advised pt not to take ibuprofen  Current Outpatient Prescriptions on File Prior to Visit: aspirin 81 MG EC tablet, Take 81 mg by mouth daily. , Disp: , Rfl:  Calcium Carb-Cholecalciferol (CALCIUM 500 +D) 500-400 MG-UNIT TABS, Take 1 tablet by mouth 2 (two) times daily., Disp: , Rfl:  furosemide (LASIX) 80 MG tablet, Take 80 mg by mouth daily., Disp: , Rfl:  PHENobarbital (LUMINAL) 64.8 MG tablet, Take 129.6 mg by mouth daily., Disp: , Rfl:  pravastatin (PRAVACHOL) 40 MG tablet, Take 40 mg by mouth daily., Disp: , Rfl:  Vitamin D, Ergocalciferol, (DRISDOL) 50000 UNITS CAPS capsule, Take 50,000 Units by mouth every 7 (seven) days. On Friday, Disp: , Rfl:  zonisamide (ZONEGRAN) 100 MG capsule, Take 200 mg by mouth at bedtime., Disp: ,  Rfl:   No current facility-administered medications on file prior to visit.   Past Medical History:   Aortic stenosis                                 12/14          Comment:Severe- mean grad 53, peak 85   Hypertension                                                 Seizure disorder                                             Hyperlipidemia                                               Chronic diastolic heart failure                              CKD (chronic kidney disease)  Comment:a. baseline CKD stage III   Morbid obesity                                               Anemia                                                       Bilateral lower extremity edema                              Mobitz (type) I (Wenckebach's) atrioventricula*                Comment:a. 01/2013 - asymptomatic.   Bradycardia                                                    Comment:a. 01/2013 - asymptomatic.   Coronary artery disease                                      Heart murmur                                                 GERD (gastroesophageal reflux disease)                       Seizures                                                     Headache(784.0)                                              Arthritis                                                    TIA (transient ischemic attack)                 2014           Comment:pt stated she had "mini strokes"  Past Surgical History:   ABDOMINAL HYSTERECTOMY                                       Review of patient's family history indicates:   Hypertension  Social History   Marital Status: Married             Spouse Name:                      Years of Education:                 Number of children:             Occupational History   None on file  Social History Main Topics   Smoking Status: Former Smoker                   Packs/Day: 0.00  Years:           Types:  Cigarettes     Quit date: 06/03/2000   Smokeless Status: Not on file                      Alcohol Use: No             Drug Use: No             Sexual Activity: Not Currently      Other Topics            Concern   None on file  Social History Narrative   None on file    ROS: See history of present illness otherwise negative   PHYSICAL EXAM: Well-nournished, in no acute distress, in a wheelchair. Neck: No JVD, HJR, Bruit, or thyroid enlargement  Lungs: No tachypnea, clear without wheezing, rales, or rhonchi  Cardiovascular: RRR, 3/6 harsh systolic murmur at the left sternal border up into her carotids, decreased S2, no gallops, bruit, thrill, or heave.  Abdomen: BS normal. Soft without organomegaly, masses, lesions or tenderness.  Extremities: without cyanosis, clubbing or edema. Good distal pulses bilateral  SKin: Warm, no lesions or rashes   Musculoskeletal: No deformities  Neuro: no focal signs  BP 152/92  Pulse 58  Ht 5\' 1"  (1.549 m)  Wt 176 lb (79.833 kg)  BMI 33.27 kg/m2    EKG: Not performed at the request of her daughter

## 2013-09-20 NOTE — Assessment & Plan Note (Signed)
No evidence of heart failure on exam 

## 2013-09-20 NOTE — Assessment & Plan Note (Signed)
Will eventually need surgery. To see Dr. Roxy Manns on 10/04/13.  Patient asymptomatic at this time.

## 2013-10-04 ENCOUNTER — Ambulatory Visit (INDEPENDENT_AMBULATORY_CARE_PROVIDER_SITE_OTHER): Payer: Medicare Other | Admitting: Thoracic Surgery (Cardiothoracic Vascular Surgery)

## 2013-10-04 ENCOUNTER — Encounter: Payer: Self-pay | Admitting: Thoracic Surgery (Cardiothoracic Vascular Surgery)

## 2013-10-04 VITALS — BP 194/83 | HR 78 | Resp 20 | Ht 61.0 in | Wt 176.0 lb

## 2013-10-04 DIAGNOSIS — I2510 Atherosclerotic heart disease of native coronary artery without angina pectoris: Secondary | ICD-10-CM

## 2013-10-04 DIAGNOSIS — I350 Nonrheumatic aortic (valve) stenosis: Secondary | ICD-10-CM

## 2013-10-04 DIAGNOSIS — I359 Nonrheumatic aortic valve disorder, unspecified: Secondary | ICD-10-CM

## 2013-10-04 NOTE — Patient Instructions (Signed)
Contact Dr Antionette Char office as soon as possible if you develop shortness of breath, chest discomfort, dizziness or if you black out

## 2013-10-04 NOTE — Progress Notes (Signed)
[address]       [place] [address]             [phone]     CARDIOTHORACIC SURGERY OFFICE NOTE  Referring Provider is Sherren Mocha, MD PCP is Arman Filter, MD   HPI:  Patient returns for followup of severe aortic stenosis. She has a complicated past medical history with numerous comorbid medical problems that would substantially increased the risks associated with conventional surgery. She originally presented with symptoms of dizziness and syncope associated with intermittent 2:1 AV block. She was felt to have Mobitz type I AV block. and a decision was made to observe her without placement of a permanent pacemaker. She was found to have severe aortic stenosis with normal left ventricular systolic function for which she was evaluated by Dr. Burt Knack and subsequently referred for surgical consultation. She was originally seen in consultation on 05/03/2013. Since then she underwent left and right heart catheterization by Dr. Tamala Julian. At catheterization she was found to have severe three-vessel coronary artery disease with particularly high-grade stenosis involving the right coronary artery. Transvalvular gradients measured at cath were somewhat lower than that found on transthoracic echocardiogram.  Her symptoms of dizziness resolved with adjustment in her medical therapy and she has had no further episodes of dizziness, syncope, nor documented advanced AV block. She has never had any symptoms of chest pain or chest tightness either with activity or at rest, although the patient remains very sedentary. In addition, the patient has never had any symptoms of shortness of breath. She was last seen here in our office on 06/28/2013. At that time she continued to remain asymptomatic from a cardiac standpoint, and she expressed a desire to continue to hold off on any plans for high-risk surgical intervention.  She returns to the office today for followup with her husband and  daughter.  She states that over the past 3 months she has felt well and she specifically denies any symptoms of shortness of breath either with activity or at rest. She denies any symptoms of chest discomfort either with activity or at rest. She has not had any dizzy spells, nor syncope. Her husband states that last week she had a very brief "spell" where she was transiently confused. This was not associated with any dizziness, chest discomfort, or loss of consciousness.  The patient did not have any unusual motor activity to suggest that she was having a seizure, and within a few seconds she was responding appropriately. According to the family she has had these episodes in the past, although none recently. The remainder of her review of systems is unchanged from previously.    Current Outpatient Prescriptions  Medication Sig Dispense Refill  . aspirin 81 MG EC tablet Take 81 mg by mouth daily.       . Calcium Carb-Cholecalciferol (CALCIUM 500 +D) 500-400 MG-UNIT TABS Take 1 tablet by mouth 2 (two) times daily.      . furosemide (LASIX) 80 MG tablet Take 80 mg by mouth daily.      [name] PHENobarbital (LUMINAL) 64.8 MG tablet Take 129.6 mg by mouth daily.      . pravastatin (PRAVACHOL) 40 MG tablet Take 40 mg by mouth daily.      . Vitamin D, Ergocalciferol, (DRISDOL) 50000 UNITS CAPS capsule Take 50,000 Units by mouth every 7 (seven) days. On Friday      . zonisamide (ZONEGRAN) 100 MG capsule Take 200 mg by mouth at bedtime.  No current facility-administered medications for this visit.      Physical Exam:   BP 194/83  Pulse 78  Resp 20  Ht 5\' 1"  (1.549 m)  Wt 176 lb (79.833 kg)  BMI 33.27 kg/m2  SpO2 96%  General:  Obese but well-appearing  Chest:   Clear to auscultation  CV:   Regular rate and rhythm with prominent systolic murmur  Incisions:  n/a  Abdomen:  Soft and nontender  Extremities:  Warm and well-perfused  Diagnostic Tests:  n/a   Impression:  The patient has  severe aortic stenosis with normal left ventricular systolic function. She also has severe three-vessel coronary artery disease with particular high-grade stenosis involving the right coronary artery. She originally presented with symptoms of dizziness and presyncope in the setting of 2:1 AV block. This has not recurred since her medications were adjusted early last spring. She otherwise remains essentially asymptomatic with no history of chest discomfort and shortness of breath that develops only with very strenuous exertion.  The patient's family reports that she did have a transient "spell" last week where she was transiently confused, but this was not associated with dizziness, syncope, chest discomfort, nor shortness of breath. It is somewhat difficult to know what to make of these events.   Plan:  I discussed matters at length with the patient, her husband, and her daughter in the office today.  They understand that there is a very small but perhaps significant risk of syncope and/or sudden death given the nature of the patient's underlying cardiac disease.  The patient remains very rule out him to consider surgical intervention.  She will continue to followup with Dr. Burt Knack and we will plan to see her back in our office in 6 months. She understands that if she develops any symptoms of shortness of breath, chest discomfort, dizziness, or syncope she needs to return as soon as practical for further evaluation. All of their questions been addressed.  I spent in excess of 30 minutes during the conduct of this office consultation and >50% of this time involved direct face-to-face encounter with the patient for counseling and/or coordination of their care.   Valentina Gu. Roxy Manns, MD 10/04/2013 2:17 PM

## 2013-10-12 ENCOUNTER — Telehealth: Payer: Self-pay | Admitting: Cardiovascular Disease

## 2013-10-12 NOTE — Telephone Encounter (Signed)
New message     Pt has had a 7.4lbs wt gain over 3 days.  Please call pt back--[phone]

## 2013-10-12 NOTE — Telephone Encounter (Signed)
I spoke with the pt and she said she feels fine.  The pt denies SOB and swelling at this time.  The pt said she did eat more food than normal after attending a family cookout over the weekend.  I advised the pt to continue to monitor her weight and if it continues to increase or if she develops symptoms she should contact our office. Pt agreed with plan.

## 2013-10-12 NOTE — Telephone Encounter (Signed)
Agree; thx 

## 2014-01-03 ENCOUNTER — Encounter: Payer: Self-pay | Admitting: Cardiovascular Disease

## 2014-01-03 ENCOUNTER — Ambulatory Visit (INDEPENDENT_AMBULATORY_CARE_PROVIDER_SITE_OTHER): Payer: Medicare Other | Admitting: Cardiovascular Disease

## 2014-01-03 VITALS — BP 180/88 | HR 62 | Ht 61.0 in | Wt 170.4 lb

## 2014-01-03 DIAGNOSIS — I350 Nonrheumatic aortic (valve) stenosis: Secondary | ICD-10-CM

## 2014-01-03 MED ORDER — AMLODIPINE BESYLATE 5 MG PO TABS: 5.0000 mg | ORAL_TABLET | Freq: Every day | ORAL | Status: DC

## 2014-01-03 NOTE — Patient Instructions (Signed)
Your physician has recommended you make the following change in your medication:  START Amlodipine 5mg  take one by mouth daily  Your physician has requested that you have an echocardiogram in January 2016. Echocardiography is a painless test that uses sound waves to create images of your heart. It provides your doctor with information about the size and shape of your heart and how well your heart's chambers and valves are working. This procedure takes approximately one hour. There are no restrictions for this procedure.  Your physician wants you to follow-up in: 6 MONTHS with Dr Burt Knack.  You will receive a reminder letter in the mail two months in advance. If you don't receive a letter, please call our office to schedule the follow-up appointment.

## 2014-01-03 NOTE — Progress Notes (Signed)
Background: The patient is followed for coronary artery disease, severe aortic stenosis, and conduction system disease with history of 2-1 AV block. She's been managed conservatively in the setting of multiple comorbid medical conditions, previous stroke, and limited mobility. As part of her evaluation for aortic stenosis, she underwent cardiac catheterization in 2015 demonstrating severe multivessel coronary artery disease.  HPI:  68 year old woman presenting for follow-up evaluation. The patient is doing well. She is here with her daughter today. She denies chest pain, chest pressure, or shortness of breath. She's had no recent lightheaded spells. She continues to have marked weakness in her legs and she is essentially wheelchair bound. She can stand up for brief periods of time.  Studies:  2D Echo 02/16/2013: Left ventricle: The cavity size was mildly dilated. Wall thickness was increased in a pattern of moderate LVH. Systolic function was normal. The estimated ejection fraction was in the range of 60% to 65%.  ------------------------------------------------------------ Aortic valve:  Severely calcified leaflets. Doppler: There was severe stenosis.   VTI ratio of LVOT to aortic valve: 0.37. Valve area: 1.18cm^2(VTI). Indexed valve area: 0.57cm^2/m^2 (VTI). Peak velocity ratio of LVOT to aortic valve: 0.43. Valve area: 1.35cm^2 (Vmax). Indexed valve area: 0.65cm^2/m^2 (Vmax).  Mean gradient: 72mm Hg (S). Peak gradient: 53mm Hg (S).  ------------------------------------------------------------ Mitral valve: Severe MAC especially posteriorly Doppler: Trivial regurgitation.  Peak gradient: 52mm Hg (D).  ------------------------------------------------------------ Left atrium: The atrium was mildly dilated.  ------------------------------------------------------------ Atrial septum: No defect or patent foramen ovale  was identified.  ------------------------------------------------------------ Right ventricle: The cavity size was normal. Wall thickness was normal. Systolic function was normal.  ------------------------------------------------------------ Pulmonic valve:  Structurally normal valve.  Cusp separation was normal. Doppler: Transvalvular velocity was within the normal range. Trivial regurgitation.  ------------------------------------------------------------ Tricuspid valve:  Structurally normal valve.  Leaflet separation was normal. Doppler: Transvalvular velocity was within the normal range. Mild regurgitation.  ------------------------------------------------------------ Right atrium: The atrium was normal in size.  ------------------------------------------------------------ Pericardium: A trivial pericardial effusion was identified.  Cardiac catheterization 05/24/2013: HEMODYNAMICS: Aortic pressure 166/70 mmHg; LV pressure 195/12 mmHg; LVEDP 25 mm mercury; RA 7 mmHg; RV 35/7 mmHg; PA 34/12 mmHg; PCWP(mean) 17 mmHg; Cardiac Output 4.97 L per minute (Fick); 3.74 L per minute (thermodilution); Pull back across the aortic valve: left ventricular pressure 196/23 mmHg and aortic pressure 175/753 mmHg; AV gradient 21 mm mercury Peak-to-Peak.; Aortic valve area by thermodilution 1.43 cm square and by Fick 1.90 cm square  ANGIOGRAPHIC DATA: The left main coronary artery is widely patent.  The left anterior descending artery has moderate to heavy proximal and mid calcification with tandem 80 and 50% stenoses noted after the large first septal perforator. The LAD wraps around the left ventricular apex..  The left circumflex artery is large, giving origin to 4 obtuse marginal branches. The second obtuse marginal is the dominant vessel and bifurcates on the mid lateral wall. The first obtuse marginal is small and contains ostial 70-80% narrowing. The mid circumflex contains an  eccentric 80% stenosis within a heavily calcified segment. There is 30-50% narrowing in the ostium of the second obtuse marginal. The continuation of the circumflex beyond the second obtuse marginal contains segmental 85% stenosis before the origin of thE third and fourth obtuse marginalS.  The right coronary artery is dominant and contains severe segmental mid to distal disease with stenoses up to 90%.   LEFT VENTRICULOGRAM: Left ventricular angiogram was done in the 30 RAO projection and revealed normal to small LV cavity with hyperdynamic contractility and  an estimated ejection fraction of 70-80%. This is artificially higher than felt due to post PVC increase in contractility.   IMPRESSIONS: 1. Moderate to severe three-vessel coronary disease as outlined above. The circumflex and right coronary are relatively diffusely diseased.  2. Aortic stenosis, with a surprisingly low transvalvular gradient based on direct hemodynamics. This would raise a question about the possibility of contamination of the aortic velocities on echo rom dynamic LV outflow obstruction.  3. Normal to hyperdynamic LV function.  4. Normal PA pressures.  Outpatient Encounter Prescriptions as of 01/03/2014  Medication Sig  . aspirin 81 MG EC tablet Take 81 mg by mouth daily.   . Calcium Carb-Cholecalciferol (CALCIUM 500 +D) 500-400 MG-UNIT TABS Take 1 tablet by mouth 2 (two) times daily.  [name] FLUZONE HIGH-DOSE 0.5 ML SUSY   . furosemide (LASIX) 80 MG tablet Take 80 mg by mouth daily.  [name] PHENobarbital (LUMINAL) 64.8 MG tablet Take 129.6 mg by mouth daily.  . pravastatin (PRAVACHOL) 40 MG tablet Take 40 mg by mouth daily.  . Vitamin D, Ergocalciferol, (DRISDOL) 50000 UNITS CAPS capsule Take 50,000 Units by mouth every 7 (seven) days. On Friday  . zonisamide (ZONEGRAN) 100 MG capsule Take 200 mg by mouth at bedtime.    Allergies  Allergen Reactions  . Ibuprofen Other (See Comments)    Dr advised pt not to take  ibuprofen    Past Medical History  Diagnosis Date  . Aortic stenosis 12/14    Severe- mean grad 52, peak 85  . Hypertension   . Seizure disorder   . Hyperlipidemia   . Chronic diastolic heart failure   . CKD (chronic kidney disease)     a. baseline CKD stage III  . Morbid obesity   . Anemia   . Bilateral lower extremity edema   . Mobitz (type) I (Wenckebach's) atrioventricular block     a. 01/2013 - asymptomatic.  . Bradycardia     a. 01/2013 - asymptomatic.  [name] Coronary artery disease   . Heart murmur   . GERD (gastroesophageal reflux disease)   . Seizures   . Headache(784.0)   . Arthritis   . TIA (transient ischemic attack) 2014    pt stated she had "mini strokes"    family history includes Hypertension in an other family member.   ROS: Negative except as per HPI  BP 180/88 mmHg  Pulse 62  Ht 5\' 1"  (1.549 m)  Wt 170 lb 6.4 oz (77.293 kg)  BMI 32.21 kg/m2  SpO2 99%  PHYSICAL EXAM: Pt is alert and oriented, NAD, in whellchair HEENT: normal Neck: JVP - normal, carotids 2+=  Lungs: CTA bilaterally CV: RRR with grade 3/6 harsh late peaking systolic murmur at the LSB Abd: soft, NT, Positive BS, no hepatomegaly Ext: trace edema bilaterally Skin: warm/dry no rash  ASSESSMENT AND PLAN: 1. Severe aortic stenosis. The patient is currently asymptomatic. She has complex cardiac disease with multivessel CAD, aortic stenosis, and marked left ventricular hypertrophy. I personally reviewed her echo images again today. The patient has mid-cavitary obliteration of the left ventricle during systole. The aortic valve is clearly calcified and restricted with a peak velocity in excess of 4 m/s. Her LVOT velocity is increased so that the aortic valve dimensionless index is 0.43. I think she has a combination of myocardial disease and aortic stenosis. I agree that her risk of surgery is high, and it may be best to continue to watch her with medical therapy while she remains  asymptomatic.  2. Multivessel CAD. Discussion as above. We'll continue with medical therapy which includes aspirin and pravastatin. No beta blocker with her history of AV block.  3. Intermittent 2-1 AV block. The patient has been evaluated by Dr. Rayann Heman. Considering her asymptomatic status and her desire to avoid a pacemaker, we are observing closely for now.  4. Chronic kidney disease, stage III  5. Hypertension with suboptimal control. Amlodipine 5 mg daily was added today.  Follow-up: Recommend a repeat echo before she sees Dr Roxy Manns in follow-up in February. I will see her back in 6 months unless problems arise.   Sherren Mocha, MD 01/03/2014 10:46 AM

## 2014-01-04 ENCOUNTER — Telehealth: Payer: Self-pay | Admitting: Cardiovascular Disease

## 2014-01-04 NOTE — Telephone Encounter (Signed)
New Message  Sherry [name] the pt's nurse case manager in heart failure program called for the pt's recent BP and pulse reading.[name]

## 2014-01-04 NOTE — Telephone Encounter (Signed)
I left a message with pt's BP and pulse on Janie's confidential voicemail [facility]).

## 2014-01-27 ENCOUNTER — Encounter (HOSPITAL_COMMUNITY): Payer: Self-pay | Admitting: Interventional Cardiology

## 2014-02-07 ENCOUNTER — Other Ambulatory Visit: Payer: Self-pay | Admitting: Cardiovascular Disease

## 2014-03-07 ENCOUNTER — Ambulatory Visit (HOSPITAL_COMMUNITY): Payer: Medicare Other | Attending: Cardiovascular Disease

## 2014-03-07 ENCOUNTER — Encounter (HOSPITAL_COMMUNITY): Payer: Self-pay | Admitting: Cardiovascular Disease

## 2014-03-07 DIAGNOSIS — I350 Nonrheumatic aortic (valve) stenosis: Secondary | ICD-10-CM | POA: Diagnosis not present

## 2014-03-07 DIAGNOSIS — I359 Nonrheumatic aortic valve disorder, unspecified: Secondary | ICD-10-CM | POA: Diagnosis not present

## 2014-03-07 NOTE — Progress Notes (Signed)
2D Echo completed. 03/07/2014

## 2014-03-14 ENCOUNTER — Ambulatory Visit: Payer: Medicare Other | Admitting: Internal Medicine

## 2014-03-16 ENCOUNTER — Encounter: Payer: Self-pay | Admitting: Internal Medicine

## 2014-03-16 NOTE — Telephone Encounter (Signed)
I spoke with the pt and she states that this is not the correct transportation company.  She states that her enrollment in the transportation program expired in 2014 and they are currently having to complete the paper work again to re-enroll.  I made her aware that if we need to sign a form stating that she comes to our office for visits then we would be happy to help if needed.

## 2014-04-11 ENCOUNTER — Encounter: Payer: Self-pay | Admitting: Thoracic Surgery (Cardiothoracic Vascular Surgery)

## 2014-04-11 ENCOUNTER — Ambulatory Visit (INDEPENDENT_AMBULATORY_CARE_PROVIDER_SITE_OTHER): Payer: Medicare Other | Admitting: Thoracic Surgery (Cardiothoracic Vascular Surgery)

## 2014-04-11 VITALS — BP 197/79 | HR 75 | Resp 20 | Ht 61.0 in | Wt 163.0 lb

## 2014-04-11 DIAGNOSIS — I350 Nonrheumatic aortic (valve) stenosis: Secondary | ICD-10-CM | POA: Diagnosis not present

## 2014-04-11 DIAGNOSIS — I25119 Atherosclerotic heart disease of native coronary artery with unspecified angina pectoris: Secondary | ICD-10-CM

## 2014-04-11 MED ORDER — AMLODIPINE BESYLATE 5 MG PO TABS: 10.0000 mg | ORAL_TABLET | Freq: Every day | ORAL | Status: DC

## 2014-04-11 NOTE — Patient Instructions (Addendum)
Increase your dose of amlodipine (Norvasc) to 10 mg daily  Continue to monitor your blood pressure daily and follow up closely with Dr Burt Knack  Call and report any development of symptoms of chest pain or shortness of breath with activity.

## 2014-04-11 NOTE — Progress Notes (Addendum)
[address]       [place] [address]             [phone]     CARDIOTHORACIC SURGERY OFFICE NOTE  Referring Provider is Blane Ohara, MD PCP is Arman Filter, MD   HPI:  Patient returns for followup of aortic stenosis. She has a complicated past medical history with numerous comorbid medical problems, and she originally presented with symptoms of dizziness and syncope associated with intermittent 2:1 AV block. She was felt to have Mobitz type I AV block and a decision was made to observe her without placement of a permanent pacemaker.  Transthoracic echocardiogram performed 02/16/2013 suggested the presence of severe aortic stenosis with peak velocity measured across the aortic valve measured in excess of 4 m/s and mean transvalvular gradient estimated at 52 mmHg.  Left ventricular systolic function was reported normal with ejection fraction estimated 60-65%.  She was initially referred to Dr. Burt Knack who subsequently referred her for surgical consultation. I first had the opportunity to see her in consultation on 05/03/2013. Since then she underwent left and right heart catheterization by Dr. Tamala Julian. At catheterization she was found to have severe three-vessel coronary artery disease with particularly high-grade stenosis involving the right coronary artery. Transvalvular gradients measured at cath were considerably lower than that found on transthoracic echocardiogram, with peak to peak gradient reported 21 mmHg on pullback and aortic valve area estimated 1.43 cm by thermodilution and 1.90 cm by Fick cardiac output. The patient's symptoms of dizziness resolved with adjustment in her medical therapy and she has had no further episodes of dizziness, syncope, nor documented advanced AV block. She has never had any symptoms of chest pain or chest tightness either with activity or at rest, although the patient remains very sedentary. In addition, the patient has never  had any symptoms of shortness of breath. She was last seen here in our office on 10/04/2013 at which time she remained entirely asymptomatic. She has been seen in follow-up more recently by Dr. Burt Knack on 01/03/2014 at which time she was started on low-dose amlodipine for hypertension. Continued medical therapy for management of the patient's aortic stenosis and coronary artery disease was recommended.  Since then she has undergone follow-up echocardiogram and she returns to our office for routine follow-up today.  The patient reports feeling exceptionally well. She has made an effort to cut back on how much she eats, and over the past 6 months she has lost nearly 75 pounds. She denies any history of chest pain or chest tightness either with activity or rest. She denies any history of exertional shortness of breath. She states that she is getting around as well as she ever has and she feels quite well. She has not had any dizzy spells or palpitations. She has been wearing an event monitor at home.  He states that her blood pressure has been under better control, although it typically remains somewhat high with systolic pressure ranging between 150 and 180 mmHg.   Current Outpatient Prescriptions  Medication Sig Dispense Refill  . amLODipine (NORVASC) 5 MG tablet Take 1 tablet (5 mg total) by mouth daily. 90 tablet 3  . aspirin 81 MG EC tablet Take 81 mg by mouth daily.     . Calcium Carb-Cholecalciferol (CALCIUM 500 +D) 500-400 MG-UNIT TABS Take 1 tablet by mouth 2 (two) times daily.    . furosemide (LASIX) 80 MG tablet TAKE 1 TABLET BY MOUTH EVERY DAY  90 tablet 1  . PHENobarbital (LUMINAL) 64.8 MG tablet Take 129.6 mg by mouth daily.    . pravastatin (PRAVACHOL) 40 MG tablet Take 40 mg by mouth daily.    . Vitamin D, Ergocalciferol, (DRISDOL) 50000 UNITS CAPS capsule Take 50,000 Units by mouth every 7 (seven) days. On Friday    . zonisamide (ZONEGRAN) 100 MG capsule Take 200 mg by mouth at bedtime.       No current facility-administered medications for this visit.      Physical Exam:   BP 197/79 mmHg  Pulse 75  Resp 20  Ht 5\' 1"  (1.549 m)  Wt 163 lb (73.936 kg)  BMI 30.81 kg/m2  SpO2   General:  Well-appearing  Chest:   clear  CV:   Regular rate and rhythm with systolic murmur  Incisions:  n/a  Abdomen:  Soft and nontender  Extremities:  Warm and well-perfused  Diagnostic Tests:  Transthoracic Echocardiography  Patient:  Sherry [name], Sherry [name] MR #:    [id number] Study Date: 03/07/2014 Gender:   F Age:    69 Height:   154.9 cm Weight:   77.1 kg BSA:    1.85 m^2 Pt. Status: Room:  ATTENDING  Sherren Mocha ORDERING   [place] SONOGRAPHER Wyatt Mage, RDCS PERFORMING  [facility], Outpatient  cc:  ------------------------------------------------------------------- LV EF: 65% -  70%  ------------------------------------------------------------------- Indications:   Aortic stenosis (I35.0).  ------------------------------------------------------------------- History:  PMH:  Syncope. Coronary artery disease. Congestive heart failure. Risk factors: Aortic stenosis. Second degree AV block. CKD stage III. Bradycardia. Anemia. Dizziness. Former tobacco use. Hypertension. Morbidly obese. Dyslipidemia.  ------------------------------------------------------------------- Study Conclusions  - Left ventricle: The cavity size was normal. Wall thickness was increased in a pattern of severe LVH. Systolic function was vigorous. The estimated ejection fraction was in the range of 65% to 70%. Doppler parameters are consistent with abnormal left ventricular relaxation (grade 1 diastolic dysfunction). - Aortic valve: AV is thickened, heavily calcified with restricted motion Peak and mean gradients through the valve are 38 and 23 mm Hg respectively conssitent with mild AS. Leaflets are  difficult to see. In short axis motion is identified to suggest moderate AS but again, calcification is significant. . These findings are very different from gradients found in echo done in 2014, But, gradients do correspond to those found on recent cath. Consider TEE to further define. Valve area (VTI): 1.05 cm^2. Valve area (Vmean): 1.15 cm^2. - Mitral valve: There was mild regurgitation. - Left atrium: The atrium was mildly dilated. - Pericardium, extracardiac: A small pericardial effusion was identified.  ------------------------------------------------------------------- Labs, prior tests, procedures, and surgery: Transthoracic echocardiography (02/16/2013).   EF was 65% and PA pressure was 45 (systolic). Aortic valve: peak gradient of 85 mm Hg and mean gradient of 52 mm Hg.  Transthoracic echocardiography. M-mode, complete 2D, spectral Doppler, and color Doppler. Birthdate: Patient birthdate: Oct 26, 1945. Age: Patient is 69 yr old. Sex: Gender: female. BMI: 32.1 kg/m^2. Blood pressure:   180/88 Patient status: Outpatient. Study date: Study date: 03/07/2014. Study time: 10:23 AM. Location: [facility]  -------------------------------------------------------------------  ------------------------------------------------------------------- Left ventricle: The cavity size was normal. Wall thickness was increased in a pattern of severe LVH. Systolic function was vigorous. The estimated ejection fraction was in the range of 65% to 70%. Doppler parameters are consistent with abnormal left ventricular relaxation (grade 1 diastolic dysfunction).  ------------------------------------------------------------------- Aortic valve: AV is thickened, heavily calcified with restricted motion Peak and mean gradients through the valve are 38  and 23 mm Hg respectively conssitent with mild AS. Leaflets are difficult to see. In short axis motion is  identified to suggest moderate AS but again, calcification is significant. . These findings are very different from gradients found in echo done in 2014, But, gradients do correspond to those found on recent cath. Consider TEE to further define. Doppler:   VTI ratio of LVOT to aortic valve: 0.33. Valve area (VTI): 1.05 cm^2. Indexed valve area (VTI): 0.57 cm^2/m^2. Mean velocity ratio of LVOT to aortic valve: 0.37. Valve area (Vmean): 1.15 cm^2. Indexed valve area (Vmean): 0.62 cm^2/m^2.  Mean gradient (S): 19 mm Hg.  ------------------------------------------------------------------- Mitral valve:  Calcified annulus. Moderately thickened leaflets . Doppler: There was mild regurgitation.  Peak gradient (D): 4 mm Hg.  ------------------------------------------------------------------- Left atrium: The atrium was mildly dilated.  ------------------------------------------------------------------- Right ventricle: The cavity size was normal. Wall thickness was normal. Systolic function was normal.  ------------------------------------------------------------------- Pulmonic valve:  Structurally normal valve.  Cusp separation was normal. Doppler: Transvalvular velocity was within the normal range. There was no regurgitation.  ------------------------------------------------------------------- Tricuspid valve:  Structurally normal valve.  Leaflet separation was normal. Doppler: Transvalvular velocity was within the normal range. There was trivial regurgitation.  ------------------------------------------------------------------- Right atrium: The atrium was normal in size.  ------------------------------------------------------------------- Pericardium: A small pericardial effusion was identified.  ------------------------------------------------------------------- Systemic veins: Inferior vena cava: The vessel was normal in size. The respirophasic diameter  changes were in the normal range (= 50%), consistent with normal central venous pressure.  ------------------------------------------------------------------- Measurements  Left ventricle              Value     Reference LV ID, ED, PLAX chordal      (L)   38  mm    43 - 52 LV ID, ES, PLAX chordal      (L)   22  mm    23 - 38 LV fx shortening, PLAX chordal      42  %    >=29 LV PW thickness, ED            17  mm    --------- IVS/LV PW ratio, ED            0.82      <=1.3 Stroke volume, 2D             82  ml    --------- Stroke volume/bsa, 2D           44  ml/m^2  --------- LV e&', lateral              3.83 cm/s   --------- LV E/e&', lateral             27.42     --------- LV e&', medial               3.53 cm/s   --------- LV E/e&', medial              29.75     --------- LV e&', average              3.68 cm/s   --------- LV E/e&', average             28.53     ---------  Ventricular septum            Value     Reference IVS thickness, ED             14  mm    ---------  LVOT  Value     Reference LVOT ID, S                20  mm    --------- LVOT area                 3.14 cm^2   --------- LVOT mean velocity, S           74  cm/s   --------- LVOT VTI, S                26.2 cm    ---------  Aortic valve               Value     Reference Aortic valve mean velocity, S       202  cm/s   --------- Aortic valve VTI, S            78.4 cm    --------- Aortic mean gradient, S          19  mm Hg  --------- VTI ratio, LVOT/AV             0.33      --------- Aortic valve area, VTI          1.05 cm^2   --------- Aortic valve area/bsa, VTI        0.57 cm^2/m^2 --------- Velocity ratio, mean, LVOT/AV       0.37      --------- Aortic valve area, mean velocity     1.15 cm^2   --------- Aortic valve area/bsa, mean        0.62 cm^2/m^2 --------- velocity  Aorta                   Value     Reference Aortic root ID, ED            31  mm    ---------  Left atrium                Value     Reference LA ID, A-P, ES              25  mm    --------- LA ID/bsa, A-P              1.35 cm/m^2  <=2.2 LA volume, S               75  ml    --------- LA volume/bsa, S             40.5 ml/m^2  --------- LA volume, ES, 1-p A4C          45.8 ml    --------- LA volume/bsa, ES, 1-p A4C        24.7 ml/m^2  --------- LA volume, ES, 1-p A2C          105  ml    --------- LA volume/bsa, ES, 1-p A2C        56.6 ml/m^2  ---------  Mitral valve               Value     Reference Mitral E-wave peak velocity        105  cm/s   --------- Mitral deceleration time     (H)   412  ms    150 - 230 Mitral peak gradient, D          4   mm Hg  --------- Mitral E/A ratio, peak  0.9      ---------  Systemic veins              Value     Reference Estimated CVP               3   mm Hg  ---------  Right ventricle              Value     Reference RV s&', lateral, S             9.9  cm/s   ---------  Legend: (L) and (H) mark values outside specified reference range.  ------------------------------------------------------------------- Prepared and Electronically  Authenticated by  Dorris Carnes, M.D. 2016-01-18T21:01:38   Impression:  I have personally reviewed the patient's recent transthoracic echocardiogram and the diagnostic cardiac catheterization performed last April. The patient likely has moderate aortic stenosis.  On careful review of the patient's recent echocardiogram, one can appreciate that one of the 3 leaflets of the aortic valve moves reasonably well, although all 3 leaflets are unquestionably thickened and somewhat restricted. Peak velocity measured across the aortic valve was measured considerably less than 4 m/s, and the corresponding mean transvalvular gradient was estimated only 19 mmHg, similar to the gradient measured at the time of catheterization last April.  The dimensionless velocity ratio between the LVOT and the aortic valve measured 0.37 and left ventricular systolic function remains normal or hyperdynamic, with ejection fraction estimated 65-70%. The patient does have three-vessel coronary artery disease including fairly high-grade stenosis involving both the right coronary artery and the terminal branch of the left circumflex coronary artery. She has never had any symptoms of chest pain or chest tightness consistent with angina pectoris, although she did originally present with second degree AV block.  Alternatives include continued medical therapy versus proceeding with elective aortic valve replacement and coronary artery bypass grafting in the near future. Risks associated with conventional surgery will be significant because of the patient's underlying comorbid medical conditions.  I feel that it remains reasonable to continue to watch this patient carefully with close follow-up on medical therapy.  I do not feel that she has severe aortic stenosis, and in the near future I suspect that her continued clinical stability will depend more on her underlying coronary artery disease than on her aortic valve disease. She certainly remains at  risk for acute myocardial infarction or other events related to her underlying coronary artery disease.  However, she is clearly doing very well on medical therapy at this time.      Plan:  I discussed matters at length with the patient and her daughter in the office today. She desires to continue to follow-up carefully with Dr. Burt Knack. We will plan to see her back in 1 year, or sooner should her clinical condition change in any significant way. She has been reminded to remain vigilant to report any changes in her clinical condition, with particular concerns should she develop exertional shortness of breath or chest discomfort.  I did instruct the patient to increase her dose of amlodipine to 10 mg daily and continue to monitor her blood pressure carefully.  All of her questions have been addressed.     I spent in excess of 15 minutes during the conduct of this office consultation and >50% of this time involved direct face-to-face encounter with the patient for counseling and/or coordination of their care.   Valentina Gu. Roxy Manns, MD 04/11/2014 2:38 PM

## 2014-04-13 ENCOUNTER — Other Ambulatory Visit: Payer: Self-pay | Admitting: *Deleted

## 2014-04-13 MED ORDER — PRAVASTATIN SODIUM 40 MG PO TABS: 40.0000 mg | ORAL_TABLET | Freq: Every day | ORAL | Status: DC

## 2014-04-22 ENCOUNTER — Ambulatory Visit (INDEPENDENT_AMBULATORY_CARE_PROVIDER_SITE_OTHER): Payer: Medicare Other | Admitting: Internal Medicine

## 2014-04-22 ENCOUNTER — Encounter: Payer: Self-pay | Admitting: Internal Medicine

## 2014-04-22 VITALS — BP 185/81 | HR 48 | Temp 97.8°F | Ht 61.0 in | Wt 163.7 lb

## 2014-04-22 DIAGNOSIS — Z7982 Long term (current) use of aspirin: Secondary | ICD-10-CM

## 2014-04-22 DIAGNOSIS — E559 Vitamin D deficiency, unspecified: Secondary | ICD-10-CM

## 2014-04-22 DIAGNOSIS — G40909 Epilepsy, unspecified, not intractable, without status epilepticus: Secondary | ICD-10-CM

## 2014-04-22 DIAGNOSIS — I10 Essential (primary) hypertension: Secondary | ICD-10-CM | POA: Diagnosis not present

## 2014-04-22 DIAGNOSIS — I2510 Atherosclerotic heart disease of native coronary artery without angina pectoris: Secondary | ICD-10-CM | POA: Diagnosis not present

## 2014-04-22 DIAGNOSIS — I25119 Atherosclerotic heart disease of native coronary artery with unspecified angina pectoris: Secondary | ICD-10-CM

## 2014-04-22 DIAGNOSIS — Z0000 Encounter for general adult medical examination without abnormal findings: Secondary | ICD-10-CM

## 2014-04-22 HISTORY — DX: Vitamin D deficiency, unspecified: E55.9

## 2014-04-22 LAB — BASIC METABOLIC PANEL WITH GFR
BUN: 21 mg/dL (ref 6–23)
CO2: 24 mEq/L (ref 19–32)
Calcium: 9.3 mg/dL (ref 8.4–10.5)
Chloride: 101 mEq/L (ref 96–112)
Creat: 1.33 mg/dL — ABNORMAL HIGH (ref 0.50–1.10)
GFR, Est African American: 48 mL/min — ABNORMAL LOW
GFR, Est Non African American: 41 mL/min — ABNORMAL LOW
GLUCOSE: 80 mg/dL (ref 70–99)
Potassium: 3.2 mEq/L — ABNORMAL LOW (ref 3.5–5.3)
Sodium: 141 mEq/L (ref 135–145)

## 2014-04-22 LAB — CBC
HEMATOCRIT: 40 % (ref 36.0–46.0)
Hemoglobin: 13.3 g/dL (ref 12.0–15.0)
MCH: 28.1 pg (ref 26.0–34.0)
MCHC: 33.3 g/dL (ref 30.0–36.0)
MCV: 84.4 fL (ref 78.0–100.0)
MPV: 10.7 fL (ref 8.6–12.4)
PLATELETS: 274 10*3/uL (ref 150–400)
RBC: 4.74 MIL/uL (ref 3.87–5.11)
RDW: 15.4 % (ref 11.5–15.5)
WBC: 3.3 10*3/uL — ABNORMAL LOW (ref 4.0–10.5)

## 2014-04-22 NOTE — Progress Notes (Signed)
Medicine attending: Medical history, presenting problems, physical findings, and medications, reviewed with Dr Karle Starch Moding and I concur with his evaluation and management plan. Lady with AS. Chronically elevated systolic BP. We will defer any med changes to Cardiology.

## 2014-04-22 NOTE — Assessment & Plan Note (Addendum)
BP Readings from Last 3 Encounters:  04/22/14 185/81  04/11/14 197/79  01/03/14 180/88    Lab Results  Component Value Date   NA 143 05/24/2013   K 3.8 05/24/2013   CREATININE 1.42* 05/24/2013    Assessment: Blood pressure control: severely elevated Progress toward BP goal:  unchanged Comments: Her blood pressure has been persistently elevated over the last year. She has been following with cardiology who has been slowly tapering up her amlodipine. She must avoid beta blockers given her AV block.  Plan: Medications:  Continue current medications: amlodipine 10 mg daily, Lasix 80 mg daily. I sent a message to Dr. Burt Knack asking him for a conditions on additional antihypertensives given her severe aortic stenosis. Educational resources provided: brochure Self management tools provided: home blood pressure logbook Other plans: We will follow-up recommendations from Dr. Burt Knack and relay them to the patient. Check BMP and CBC today.  --Addendum-- Arman Filter, MD, PhD Internal Medicine Intern Pager: [phone] 04/25/2014,12:24 PM  She is hypokalemic with K of 3.2 on BMP due to increased dose of Lasix a couple of months ago. -Start KDur 20 mEq daily. Called patient and sent prescription to [facility]. -Recheck K at follow up in 1 month.  --Addendum-- Arman Filter, MD, PhD Internal Medicine Intern Pager: [phone] 04/27/2014,8:54 AM  Received a message from Dr. Burt Knack who recommended starting an ACE inhibitor for her blood pressure due to concern that hydralazine/Imdur would lead to too much vasodilation with her aortic stenosis.  She cannot tolerate a beta blocker given her heart block.  She has been on lisinopril in the past without issue.  She will need close follow up given her CKD. -Start lisinopril 10 mg daily. -Sent prescription to pharmacy and called patient to ask her to pick it up. -Recheck BMP at follow up appointment.

## 2014-04-22 NOTE — Assessment & Plan Note (Addendum)
She continues to follow with Dr. Burt Knack of cardiology who recommends medical management of her aortic stenosis and coronary disease. Other than some lightheadedness yesterday, she continues to be asymptomatic currently. -Follow-up with Dr. Burt Knack. -Continue aspirin 81 mg daily.

## 2014-04-22 NOTE — Progress Notes (Signed)
   Subjective:    Patient ID: Sherry [name], female    DOB: 03-19-1945, 69 y.o.   MRN: [id number]  HPI Sherry [name] is a 69 year old woman with history of severe three-vessel coronary artery disease and severe aortic stenosis managed medically, chronic diastolic heart failure, asymptomatic bradycardia with Mobitz AV block, hypertension, seizure disorder, hyperlipidemia, CKD stage III, morbid obesity, anemia, GERD, and CVA who presents for routine follow-up.  She has been following with Dr. Burt Knack of cardiology regarding her coronary artery disease, aortic stenosis, and intermittent 2-1 AV block. She last saw him on 01/03/2014. She was started on amlodipine 5 mg daily, and told to follow-up with Dr. Roxy Manns of cardiac thoracic surgery. She was told to follow up again with Dr. Burt Knack in 6 months.  She had a follow-up appointment with Dr. Roxy Manns of cardiothoracic surgery on 03/22/2014 to discuss her aortic stenosis and coronary artery disease. At that time, it was decided to continue with medical therapy at this time given her significant comorbid conditions. She was told to follow-up in one year. Her dose of amlodipine was increased to 10 mg daily.  She reports taking that along with her Lasix this morning.  She reports feeling well lately.  She felt a little light-headed yesterday while taking a bath, but she thinks she may have overexerted herself.  She denies any recent syncope or falls.  She denies chest pain or shortness of breath.  She has been measuring her blood pressure at home, and it has been running in the 150s-160s.  She follows with [facility] regarding her seizure disorder, they currently have her on phenobarbital and zonisamide.  She hasn't had a seizure in several years, and she last saw them in December.  Review of Systems  Constitutional: Negative for fever, chills and fatigue.  HENT: Negative for congestion, rhinorrhea and sore throat.   Respiratory: Negative for cough and  shortness of breath.   Cardiovascular: Negative for chest pain and palpitations.  Gastrointestinal: Negative for nausea, vomiting, diarrhea, constipation and abdominal distention.  Genitourinary: Negative for dysuria and difficulty urinating.  Musculoskeletal: Negative for myalgias and arthralgias.  Skin: Negative for rash.  Neurological: Positive for dizziness. Negative for syncope, weakness, numbness and headaches.       Objective:   Physical Exam  Constitutional: She is oriented to person, place, and time. She appears well-developed and well-nourished. No distress.  Cardiovascular: Regular rhythm.   Murmur (3/6 Systolic ejection murmur, best heard at RUS border.) heard. Bradycardic.  Pulmonary/Chest: Effort normal and breath sounds normal. No respiratory distress. She has no wheezes. She has no rales.  Abdominal: Soft. Bowel sounds are normal. She exhibits no distension. There is no tenderness.  Musculoskeletal: Normal range of motion. She exhibits edema (trace lower extremity.). She exhibits no tenderness.  Neurological: She is alert and oriented to person, place, and time. No cranial nerve deficit. She exhibits normal muscle tone.  Skin: Skin is warm and dry. No rash noted. No erythema.          Assessment & Plan:  Please see problem-based assessment and plan.

## 2014-04-22 NOTE — Assessment & Plan Note (Signed)
She reports getting a flu shot earlier this year at [facility]. She is due for colonoscopy, mammogram, and DEXA scan. However, she has a temporary lapse in her insurance due to insurance card issues, and I am unable to place referrals today. -Return to clinic in 1 month with placement of referrals for health maintenance at that time.

## 2014-04-22 NOTE — Assessment & Plan Note (Addendum)
She was previously on ergocalciferol 50,000 units weekly, but this was recently stopped. She does not have any vitamin D levels in Epic, so it is unclear what dose of vitamin D she should be on currently. -Check vitamin D 25. -Continue calcium 500+ D twice a day for now.  --Addendum-- Arman Filter, MD, PhD Internal Medicine Intern Pager: [phone] 04/25/2014,12:26 PM  Vitamin D25 normal. -Continue calcium/vit D as above.

## 2014-04-22 NOTE — Assessment & Plan Note (Signed)
She's not had a seizure in some time. -Continue phenobarbital 129.6 mg daily and zonisamide 200 milligrams daily at bedtime.  -Continue to follow with [facility]

## 2014-04-22 NOTE — Patient Instructions (Signed)
Thank you for coming to clinic today Sherry [name].  General instructions: -I will discuss your blood pressure with Dr. Burt Knack. I will let you know if he wants me to add any new medications. -I would like to check some labs today. I will contact you if they're abnormal. -Make sure to follow up with your cardiologist and neurologist. -Please make a follow up appointment to return to clinic in 1 month, after your insurance has been sorted out.  Thank you for bringing your medicines today. This helps [place] keep you safe from mistakes.

## 2014-04-23 LAB — VITAMIN D 25 HYDROXY (VIT D DEFICIENCY, FRACTURES): Vit D, 25-Hydroxy: 32 ng/mL (ref 30–100)

## 2014-04-25 ENCOUNTER — Encounter: Payer: Self-pay | Admitting: Internal Medicine

## 2014-04-25 MED ORDER — POTASSIUM CHLORIDE ER 10 MEQ PO TBCR: 20.0000 meq | EXTENDED_RELEASE_TABLET | Freq: Every day | ORAL | Status: DC

## 2014-04-25 NOTE — Addendum Note (Signed)
Addended by: Charlesetta Shanks on: 04/25/2014 12:27 PM   Modules accepted: Orders, SmartSet

## 2014-04-25 NOTE — Addendum Note (Signed)
Addended by: Charlesetta Shanks on: 04/25/2014 12:30 PM   Modules accepted: Miquel Dunn

## 2014-04-27 MED ORDER — LISINOPRIL 10 MG PO TABS: 10.0000 mg | ORAL_TABLET | Freq: Every day | ORAL | Status: DC

## 2014-04-27 NOTE — Addendum Note (Signed)
Addended by: Charlesetta Shanks on: 04/27/2014 08:57 AM   Modules accepted: Orders, SmartSet

## 2014-04-29 ENCOUNTER — Telehealth: Payer: Self-pay | Admitting: *Deleted

## 2014-04-29 NOTE — Telephone Encounter (Signed)
Janie with [place] [phone] called to get BP from last office visit 04/22/14. She is with heart failure program with insurance company. Hilda Blades Liah Morr RN 04/29/14 11AM

## 2014-04-29 NOTE — Telephone Encounter (Signed)
Talked with pt about meds request of [employer] heart failure program   -  Dr Trudee Kuster added lisinopril and K CL to med list. Hilda Blades Beanca Kiester RN 04/29/14 4:30PM

## 2014-05-03 ENCOUNTER — Telehealth: Payer: Self-pay | Admitting: *Deleted

## 2014-05-03 NOTE — Telephone Encounter (Signed)
I started lisinopril at the recommendation of her cardiologist Dr. Burt Knack.  Her kidney function was at baseline prior to initiation.  I agree that she should be seen for labs and follow up as soon as she can get into clinic.  It would also be helpful for her to follow up with her cardiologist.

## 2014-05-03 NOTE — Telephone Encounter (Signed)
Pt is unable to come in today.  She will find a ride and call back.

## 2014-05-03 NOTE — Telephone Encounter (Signed)
Call from [facility] case [employer].# [phone] Nurse states pt's weight has gone up 5.2 pounds in 3 days and some swelling.  Denies SOB Pt feels she is retaining fluid, and she is not going to Bathroom as much as she has. Pt was started on Lisinopril 10 mg daily on 3/9 Nurse states this med was stopped in past related to kidney function.  Next visit 4/15 with PCP

## 2014-05-03 NOTE — Telephone Encounter (Signed)
Ethelene Hal has a 2:45. Pls sch her in that slot but have her come at 1:30 for stat labs (I will enter once she confirms.)

## 2014-05-04 NOTE — Telephone Encounter (Signed)
Pt called today and she can get a ride tomorrow.  Scheduled for 2:30 on 3/17

## 2014-05-05 ENCOUNTER — Ambulatory Visit (INDEPENDENT_AMBULATORY_CARE_PROVIDER_SITE_OTHER): Payer: Medicare Other | Admitting: Internal Medicine

## 2014-05-05 ENCOUNTER — Encounter: Payer: Self-pay | Admitting: Internal Medicine

## 2014-05-05 VITALS — BP 138/68 | HR 66 | Temp 98.1°F | Wt 169.5 lb

## 2014-05-05 DIAGNOSIS — Z0000 Encounter for general adult medical examination without abnormal findings: Secondary | ICD-10-CM

## 2014-05-05 DIAGNOSIS — I5032 Chronic diastolic (congestive) heart failure: Secondary | ICD-10-CM

## 2014-05-05 DIAGNOSIS — I10 Essential (primary) hypertension: Secondary | ICD-10-CM | POA: Diagnosis not present

## 2014-05-05 LAB — BASIC METABOLIC PANEL WITH GFR
BUN: 28 mg/dL — AB (ref 6–23)
CALCIUM: 9 mg/dL (ref 8.4–10.5)
CHLORIDE: 106 meq/L (ref 96–112)
CO2: 24 meq/L (ref 19–32)
CREATININE: 1.54 mg/dL — AB (ref 0.50–1.10)
GFR, EST AFRICAN AMERICAN: 40 mL/min — AB
GFR, Est Non African American: 35 mL/min — ABNORMAL LOW
Glucose, Bld: 94 mg/dL (ref 70–99)
POTASSIUM: 4.1 meq/L (ref 3.5–5.3)
Sodium: 141 mEq/L (ref 135–145)

## 2014-05-05 NOTE — Assessment & Plan Note (Addendum)
BP Readings from Last 3 Encounters:  05/05/14 138/68  04/22/14 185/81  04/11/14 197/79    Lab Results  Component Value Date   NA 141 04/22/2014   K 3.2* 04/22/2014   CREATININE 1.33* 04/22/2014    Assessment: Blood pressure control: controlled Progress toward BP goal:  at goal Comments: She was recently started on lisinopril 10 mg after consultation with Dr Burt Knack about what antihypertensive is safe in setting of her aortic stenosis. She was already on amlodipine 10 mg daily, lasix 80 mg daily, and KDur 35mEq daily  Plan: Medications:  continue current medications Educational resources provided:   Self management tools provided:   Other plans: check BMP today since starting ACEi   ADDENDUM: BMP returned with K improved at 4.1 since starting ACEi. While creatinine is up at 1.54 from 1.33 two weeks prior, her baseline appears to be around 1.6 so will continue for now. Consider rechecking BMP at next visit.  Lottie Mussel, MD 05/06/14, 9:19 am

## 2014-05-05 NOTE — Assessment & Plan Note (Addendum)
Ms Unknown Foley came in for suspected volume overload. She is up 6 lbs from her last visit 2 weeks ago. She subjectively feels edematous in her legs and abdomen. Her legs do have 2+ pitting edema up to knees but abdomen is benign on exam. She does not appear to be in decompensated HF. She has absolutely no respiratory symptoms and lungs are clear on exam. Last echo recently 02/2014 with EF 65-70% and grade 1 diastolic dysfunction and aortic valve with significant calcification and moderate stenosis. I do not think that aggressive diuresis is appropriate at this time as she is not in decompensated heart failure. She was given clear return precautions with emphasis on symptomology. -continue fluid restriction diet, compression stockings, elevation -continue lasix 80 mg daily, KDur 20 mEq daily -BMP -RTC with previously scheduled PCP visit in 05/2014 for re-evaluation if no alarm symptoms to return earlier

## 2014-05-05 NOTE — Patient Instructions (Signed)
It was a pleasure to see you today. You got the pneumonia vaccine. We will check your blood work today and call you if there are any issues. We did not make any medicine changes today. Please return to clinic or seek medical attention if you have any new or worsening trouble breathing at rest or exertion, chest pain, or other worrisome medical condition. We look forward to seeing you again in about one month.  Lottie Mussel, MD  General Instructions:   Please try to bring all your medicines next time. This will help [place] keep you safe from mistakes.   Progress Toward Treatment Goals:  Treatment Goal 05/05/2014  Blood pressure at goal  Prevent falls -    Self Care Goals & Plans:  Self Care Goal 04/22/2014  Manage my medications take my medicines as prescribed; bring my medications to every visit; refill my medications on time  Monitor my health -  Eat healthy foods drink diet soda or water instead of juice or soda; eat more vegetables; eat foods that are low in salt; eat baked foods instead of fried foods; eat fruit for snacks and desserts  Be physically active -    No flowsheet data found.   Care Management & Community Referrals:  Referral 01/25/2013  Referrals made for care management support none needed

## 2014-05-05 NOTE — Progress Notes (Signed)
   Subjective:    Patient ID: Sherry [name], female    DOB: 1945/06/19, 69 y.o.   MRN: [id number]  HPI  Ms Unknown Foley is a 69 year old woman with CAD, severe aortic stenosis, diastolic chronic HF, asymptomatic bradycardia with Mobitz AV block, HTN, seizures, HL, CKD3, CVA here for concerns about increased weight.  Her RN noted that her weight had increased 5 lbs in three days and was concerned about some swelling. She did not have SOB. She had recently been started on lisinopril 10 mg daily with consultation of Dr Burt Knack of cardiology due to her aortic stenosis.  She is concerned she feels like she is fluid up. She says this is mostly in her stomach and legs. She is up 6 lbs today from two weeks ago. She is fluid restricted to 1 L and been compliant with this as well as with medicines. She denies SOB at rest or even with exertion, orthopnea. She says she feels fine but was told by her nurse to come.  Review of Systems  Constitutional: Negative for fever, chills and diaphoresis.  Respiratory: Negative for chest tightness and shortness of breath.   Cardiovascular: Positive for leg swelling. Negative for chest pain.  Gastrointestinal: Positive for abdominal distention. Negative for nausea, vomiting, abdominal pain, diarrhea and constipation.  Neurological: Negative for weakness, numbness and headaches.       Objective:   Physical Exam  Constitutional: She is oriented to person, place, and time. She appears well-developed and well-nourished. No distress.  HENT:  Head: Normocephalic and atraumatic.  Mouth/Throat: Oropharynx is clear and moist.  Cardiovascular: Normal rate, regular rhythm and intact distal pulses.  Exam reveals no gallop and no friction rub.   3/6 systolic ejection murmur   Pulmonary/Chest: Effort normal and breath sounds normal. No respiratory distress. She has no wheezes.  Abdominal: Soft. Bowel sounds are normal. She exhibits no distension. There is no tenderness.    Musculoskeletal:  B/l 2+ pitting edema up to knees  Neurological: She is alert and oriented to person, place, and time.  Skin: She is not diaphoretic.  Vitals reviewed.         Assessment & Plan:

## 2014-05-05 NOTE — Progress Notes (Signed)
Case discussed with Dr. Rothman at time of visit. We reviewed the resident's history and exam and pertinent patient test results. I agree with the assessment, diagnosis, and plan of care documented in the resident's note. 

## 2014-05-05 NOTE — Assessment & Plan Note (Signed)
Sherry [name] wants the prevnar vaccine today but the [facility] is out. Please revisit at next appointment.

## 2014-06-03 ENCOUNTER — Encounter: Payer: Medicare Other | Admitting: Internal Medicine

## 2014-06-06 ENCOUNTER — Ambulatory Visit (INDEPENDENT_AMBULATORY_CARE_PROVIDER_SITE_OTHER): Payer: Medicare Other | Admitting: Internal Medicine

## 2014-06-06 ENCOUNTER — Encounter: Payer: Self-pay | Admitting: Internal Medicine

## 2014-06-06 VITALS — BP 139/62 | HR 60 | Temp 98.2°F | Ht 61.0 in | Wt 164.2 lb

## 2014-06-06 DIAGNOSIS — I10 Essential (primary) hypertension: Secondary | ICD-10-CM

## 2014-06-06 DIAGNOSIS — R609 Edema, unspecified: Secondary | ICD-10-CM

## 2014-06-06 DIAGNOSIS — I5032 Chronic diastolic (congestive) heart failure: Secondary | ICD-10-CM

## 2014-06-06 DIAGNOSIS — Z0000 Encounter for general adult medical examination without abnormal findings: Secondary | ICD-10-CM | POA: Diagnosis not present

## 2014-06-06 DIAGNOSIS — Z23 Encounter for immunization: Secondary | ICD-10-CM

## 2014-06-06 LAB — BASIC METABOLIC PANEL WITH GFR
BUN: 25 mg/dL — ABNORMAL HIGH (ref 6–23)
CHLORIDE: 102 meq/L (ref 96–112)
CO2: 28 mEq/L (ref 19–32)
CREATININE: 1.64 mg/dL — AB (ref 0.50–1.10)
Calcium: 9.3 mg/dL (ref 8.4–10.5)
GFR, Est African American: 37 mL/min — ABNORMAL LOW
GFR, Est Non African American: 32 mL/min — ABNORMAL LOW
Glucose, Bld: 90 mg/dL (ref 70–99)
Potassium: 4.1 mEq/L (ref 3.5–5.3)
SODIUM: 138 meq/L (ref 135–145)

## 2014-06-06 NOTE — Assessment & Plan Note (Signed)
1+ b/l edema today  Continue with compression stockings and leg elevation, no need to inc. Lasix at this time

## 2014-06-06 NOTE — Assessment & Plan Note (Signed)
BP Readings from Last 3 Encounters:  06/06/14 139/62  05/05/14 138/68  04/22/14 185/81    Lab Results  Component Value Date   NA 141 05/05/2014   K 4.1 05/05/2014   CREATININE 1.54* 05/05/2014    Assessment: Blood pressure control: controlled Progress toward BP goal:  at goal Comments: none  Plan: Medications:  continue current medications Lasix 80 mg qd, Lisinopril 10 mg qd  Other plans: f/u 3 months, check BMET today

## 2014-06-06 NOTE — Assessment & Plan Note (Signed)
Prevnar today, refer for mammogram Will likely need colonoscopy in 2018

## 2014-06-06 NOTE — Assessment & Plan Note (Signed)
Wt down, 1+ leg edema Will cont. Lasix 80 mg qd Check BMET today Limit salt and fluid intake disc'ed  Cont to weigh at home with [facility] RN, f/u in 3 months

## 2014-06-06 NOTE — Patient Instructions (Signed)
General Instructions: Please follow up in 3 months with your regular doctor Take care  We will check labs today and be in touch!   Treatment Goals:  Goals (1 Years of Data) as of 06/06/14          As of Today 05/05/14 04/22/14 04/22/14 04/11/14     Blood Pressure   . Blood Pressure < 130/80  139/62 138/68 185/81 195/86 197/79    Note created  05/18/2012  4:48 PM by Charlann Lange, MD   Given her CKD      Lifestyle   . Prevent Falls           Result Component   . LDL CALC < 130            Progress Toward Treatment Goals:  Treatment Goal 06/06/2014  Blood pressure at goal  Prevent falls -    Self Care Goals & Plans:  Self Care Goal 06/06/2014  Manage my medications take my medicines as prescribed; bring my medications to every visit; refill my medications on time; follow the sick day instructions if I am sick  Monitor my health keep track of my blood pressure  Eat healthy foods -  Be physically active find an activity I enjoy  Meeting treatment goals maintain the current self-care plan    No flowsheet data found.   Care Management & Community Referrals:  Referral 06/06/2014  Referrals made for care management support none needed  Referrals made to community resources none    Heart Failure Heart failure is a condition in which the heart has trouble pumping blood. This means your heart does not pump blood efficiently for your body to work well. In some cases of heart failure, fluid may back up into your lungs or you may have swelling (edema) in your lower legs. Heart failure is usually a long-term (chronic) condition. It is important for you to take good care of yourself and follow your health care provider's treatment plan. CAUSES  Some health conditions can cause heart failure. Those health conditions include:  High blood pressure (hypertension). Hypertension causes the heart muscle to work harder than normal. When pressure in the blood vessels is high, the heart needs to pump  (contract) with more force in order to circulate blood throughout the body. High blood pressure eventually causes the heart to become stiff and weak.  Coronary artery disease (CAD). CAD is the buildup of cholesterol and fat (plaque) in the arteries of the heart. The blockage in the arteries deprives the heart muscle of oxygen and blood. This can cause chest pain and may lead to a heart attack. High blood pressure can also contribute to CAD.  Heart attack (myocardial infarction). A heart attack occurs when one or more arteries in the heart become blocked. The loss of oxygen damages the muscle tissue of the heart. When this happens, part of the heart muscle dies. The injured tissue does not contract as well and weakens the heart's ability to pump blood.  Abnormal heart valves. When the heart valves do not open and close properly, it can cause heart failure. This makes the heart muscle pump harder to keep the blood flowing.  Heart muscle disease (cardiomyopathy or myocarditis). Heart muscle disease is damage to the heart muscle from a variety of causes. These can include drug or alcohol abuse, infections, or unknown reasons. These can increase the risk of heart failure.  Lung disease. Lung disease makes the heart work harder because the lungs do not  work properly. This can cause a strain on the heart, leading it to fail.  Diabetes. Diabetes increases the risk of heart failure. High blood sugar contributes to high fat (lipid) levels in the blood. Diabetes can also cause slow damage to tiny blood vessels that carry important nutrients to the heart muscle. When the heart does not get enough oxygen and food, it can cause the heart to become weak and stiff. This leads to a heart that does not contract efficiently.  Other conditions can contribute to heart failure. These include abnormal heart rhythms, thyroid problems, and low blood counts (anemia). Certain unhealthy behaviors can increase the risk of heart  failure, including:  Being overweight.  Smoking or chewing tobacco.  Eating foods high in fat and cholesterol.  Abusing illicit drugs or alcohol.  Lacking physical activity. SYMPTOMS  Heart failure symptoms may vary and can be hard to detect. Symptoms may include:  Shortness of breath with activity, such as climbing stairs.  Persistent cough.  Swelling of the feet, ankles, legs, or abdomen.  Unexplained weight gain.  Difficulty breathing when lying flat (orthopnea).  Waking from sleep because of the need to sit up and get more air.  Rapid heartbeat.  Fatigue and loss of energy.  Feeling light-headed, dizzy, or close to fainting.  Loss of appetite.  Nausea.  Increased urination during the night (nocturia). DIAGNOSIS  A diagnosis of heart failure is based on your history, symptoms, physical examination, and diagnostic tests. Diagnostic tests for heart failure may include:  Echocardiography.  Electrocardiography.  Chest X-ray.  Blood tests.  Exercise stress test.  Cardiac angiography.  Radionuclide scans. TREATMENT  Treatment is aimed at managing the symptoms of heart failure. Medicines, behavioral changes, or surgical intervention may be necessary to treat heart failure.  Medicines to help treat heart failure may include:  Angiotensin-converting enzyme (ACE) inhibitors. This type of medicine blocks the effects of a blood protein called angiotensin-converting enzyme. ACE inhibitors relax (dilate) the blood vessels and help lower blood pressure.  Angiotensin receptor blockers (ARBs). This type of medicine blocks the actions of a blood protein called angiotensin. Angiotensin receptor blockers dilate the blood vessels and help lower blood pressure.  Water pills (diuretics). Diuretics cause the kidneys to remove salt and water from the blood. The extra fluid is removed through urination. This loss of extra fluid lowers the volume of blood the heart  pumps.  Beta blockers. These prevent the heart from beating too fast and improve heart muscle strength.  Digitalis. This increases the force of the heartbeat.  Healthy behavior changes include:  Obtaining and maintaining a healthy weight.  Stopping smoking or chewing tobacco.  Eating heart-healthy foods.  Limiting or avoiding alcohol.  Stopping illicit drug use.  Physical activity as directed by your health care provider.  Surgical treatment for heart failure may include:  A procedure to open blocked arteries, repair damaged heart valves, or remove damaged heart muscle tissue.  A pacemaker to improve heart muscle function and control certain abnormal heart rhythms.  An internal cardioverter defibrillator to treat certain serious abnormal heart rhythms.  A left ventricular assist device (LVAD) to assist the pumping ability of the heart. HOME CARE INSTRUCTIONS   Take medicines only as directed by your health care provider. Medicines are important in reducing the workload of your heart, slowing the progression of heart failure, and improving your symptoms.  Do not stop taking your medicine unless directed by your health care provider.  Do not skip any dose  of medicine.  Refill your prescriptions before you run out of medicine. Your medicines are needed every day.  Engage in moderate physical activity if directed by your health care provider. Moderate physical activity can benefit some people. The elderly and people with severe heart failure should consult with a health care provider for physical activity recommendations.  Eat heart-healthy foods. Food choices should be free of trans fat and low in saturated fat, cholesterol, and salt (sodium). Healthy choices include fresh or frozen fruits and vegetables, fish, lean meats, legumes, fat-free or low-fat dairy products, and whole grain or high fiber foods. Talk to a dietitian to learn more about heart-healthy foods.  Limit sodium  if directed by your health care provider. Sodium restriction may reduce symptoms of heart failure in some people. Talk to a dietitian to learn more about heart-healthy seasonings.  Use healthy cooking methods. Healthy cooking methods include roasting, grilling, broiling, baking, poaching, steaming, or stir-frying. Talk to a dietitian to learn more about healthy cooking methods.  Limit fluids if directed by your health care provider. Fluid restriction may reduce symptoms of heart failure in some people.  Weigh yourself every day. Daily weights are important in the early recognition of excess fluid. You should weigh yourself every morning after you urinate and before you eat breakfast. Wear the same amount of clothing each time you weigh yourself. Record your daily weight. Provide your health care provider with your weight record.  Monitor and record your blood pressure if directed by your health care provider.  Check your pulse if directed by your health care provider.  Lose weight if directed by your health care provider. Weight loss may reduce symptoms of heart failure in some people.  Stop smoking or chewing tobacco. Nicotine makes your heart work harder by causing your blood vessels to constrict. Do not use nicotine gum or patches before talking to your health care provider.  Keep all follow-up visits as directed by your health care provider. This is important.  Limit alcohol intake to no more than 1 drink per day for nonpregnant women and 2 drinks per day for men. One drink equals 12 ounces of beer, 5 ounces of wine, or 1 ounces of hard liquor. Drinking more than that is harmful to your heart. Tell your health care provider if you drink alcohol several times a week. Talk with your health care provider about whether alcohol is safe for you. If your heart has already been damaged by alcohol or you have severe heart failure, drinking alcohol should be stopped completely.  Stop illicit drug  use.  Stay up-to-date with immunizations. It is especially important to prevent respiratory infections through current pneumococcal and influenza immunizations.  Manage other health conditions such as hypertension, diabetes, thyroid disease, or abnormal heart rhythms as directed by your health care provider.  Learn to manage stress.  Plan rest periods when fatigued.  Learn strategies to manage high temperatures. If the weather is extremely hot:  Avoid vigorous physical activity.  Use air conditioning or fans or seek a cooler location.  Avoid caffeine and alcohol.  Wear loose-fitting, lightweight, and light-colored clothing.  Learn strategies to manage cold temperatures. If the weather is extremely cold:  Avoid vigorous physical activity.  Layer clothes.  Wear mittens or gloves, a hat, and a scarf when going outside.  Avoid alcohol.  Obtain ongoing education and support as needed.  Participate in or seek rehabilitation as needed to maintain or improve independence and quality of life. [place]  CARE IF:   Your weight increases by 03 lb/1.4 kg in 1 day or 05 lb/2.3 kg in a week.  You have increasing shortness of breath that is unusual for you.  You are unable to participate in your usual physical activities.  You tire easily.  You cough more than normal, especially with physical activity.  You have any or more swelling in areas such as your hands, feet, ankles, or abdomen.  You are unable to sleep because it is hard to breathe.  You feel like your heart is beating fast (palpitations).  You become dizzy or light-headed upon standing up. SEEK IMMEDIATE MEDICAL CARE IF:   You have difficulty breathing.  There is a change in mental status such as decreased alertness or difficulty with concentration.  You have a pain or discomfort in your chest.  You have an episode of fainting (syncope). MAKE SURE YOU:   Understand these instructions.  Will watch your  condition.  Will get help right away if you are not doing well or get worse. Document Released: 02/04/2005 Document Revised: 06/21/2013 Document Reviewed: 03/06/2012 [facility] Patient Information 2015 [place], [place]. This information is not intended to replace advice given to you by your health care provider. Make sure you discuss any questions you have with your health care provider.

## 2014-06-06 NOTE — Progress Notes (Signed)
   Subjective:    Patient ID: Sherry [name], female    DOB: 08-02-1945, 69 y.o.   MRN: [id number]  HPI Comments: 69 y.o h/o severe aortic stenosis, OA, obesity, HTN, HLD, epilepsy, CAD, CKD 3, chronic diastolic HF, h/o bradycardia, h/o AV block  She presents for f/u  1. Last visit 3/17 weight was up to 169 today wt 164.2 with clothes on and 159 this am. She is taking Lasix 80 mg x 1, denies sob, still has chronic leg swelling.  She limits fluid to 1 L per day 2. She would like prevnar today   HM -will refer for mammogram, per pt last colonoscopy was 2008 was wnl so likely needs repeat 2018, prevnar today     Review of Systems  Respiratory: Negative for shortness of breath.   Cardiovascular: Positive for leg swelling.  Gastrointestinal: Positive for constipation.       Objective:   Physical Exam  Constitutional: She is oriented to person, place, and time. Vital signs are normal. She appears well-developed and well-nourished. She is cooperative. No distress.  HENT:  Head: Normocephalic and atraumatic.  Mouth/Throat: No oropharyngeal exudate.  Eyes: Conjunctivae are normal. Right eye exhibits no discharge. Left eye exhibits no discharge. No scleral icterus.  Cardiovascular: Normal rate, regular rhythm, S1 normal and S2 normal.   Murmur heard.  Systolic murmur is present  1+ lower ext edema b/l   Pulmonary/Chest: Effort normal and breath sounds normal. No respiratory distress. She has no wheezes.  Abdominal: Soft. Bowel sounds are normal. There is no tenderness.  Neurological: She is alert and oriented to person, place, and time.  In wheelchair   Skin: Skin is warm, dry and intact. No rash noted. She is not diaphoretic.  Psychiatric: She has a normal mood and affect. Her speech is normal and behavior is normal. Judgment and thought content normal. Cognition and memory are normal.  Nursing note and vitals reviewed.         Assessment & Plan:  F/u in 3 months

## 2014-06-07 NOTE — Addendum Note (Signed)
Addended by: Sander Nephew F on: 06/07/2014 10:10 AM   Modules accepted: Orders

## 2014-06-07 NOTE — Progress Notes (Signed)
INTERNAL MEDICINE TEACHING ATTENDING ADDENDUM - Sherry Melman, MD: I reviewed and discussed at the time of visit with the resident Dr. McLean, the patient's medical history, physical examination, diagnosis and results of pertinent tests and treatment and I agree with the patient's care as documented.  

## 2014-06-17 ENCOUNTER — Encounter: Payer: Self-pay | Admitting: *Deleted

## 2014-07-04 ENCOUNTER — Telehealth: Payer: Self-pay | Admitting: Cardiovascular Disease

## 2014-07-04 NOTE — Telephone Encounter (Signed)
New messsage       Need most recent bp and pulse and date.  She also needs her most recent ejection fraction.

## 2014-07-04 NOTE — Telephone Encounter (Signed)
Sherry [name] from [facility] calling requesting her latest BP and HR and echo w/EF.  Gave her readings from [place] on 06/06/14 and Dr. Burt Knack 01/03/14.  She had an Echo 1/16 and gave her EF. No further questions.

## 2014-07-11 ENCOUNTER — Other Ambulatory Visit: Payer: Self-pay | Admitting: Cardiovascular Disease

## 2014-08-06 ENCOUNTER — Other Ambulatory Visit: Payer: Self-pay | Admitting: Cardiovascular Disease

## 2014-08-16 ENCOUNTER — Other Ambulatory Visit: Payer: Self-pay | Admitting: *Deleted

## 2014-08-16 NOTE — Telephone Encounter (Signed)
Pt changed to new pharmacy

## 2014-09-06 ENCOUNTER — Encounter: Payer: Self-pay | Admitting: Licensed Clinical Social Worker

## 2014-09-06 ENCOUNTER — Telehealth: Payer: Self-pay | Admitting: *Deleted

## 2014-09-06 ENCOUNTER — Ambulatory Visit (INDEPENDENT_AMBULATORY_CARE_PROVIDER_SITE_OTHER): Payer: Medicare Other | Admitting: Internal Medicine

## 2014-09-06 ENCOUNTER — Encounter: Payer: Self-pay | Admitting: Internal Medicine

## 2014-09-06 VITALS — BP 158/62 | HR 50 | Temp 98.0°F | Ht 61.0 in | Wt 156.8 lb

## 2014-09-06 DIAGNOSIS — G40909 Epilepsy, unspecified, not intractable, without status epilepticus: Secondary | ICD-10-CM

## 2014-09-06 DIAGNOSIS — I10 Essential (primary) hypertension: Secondary | ICD-10-CM

## 2014-09-06 DIAGNOSIS — I5032 Chronic diastolic (congestive) heart failure: Secondary | ICD-10-CM | POA: Diagnosis not present

## 2014-09-06 DIAGNOSIS — Z0000 Encounter for general adult medical examination without abnormal findings: Secondary | ICD-10-CM

## 2014-09-06 LAB — BASIC METABOLIC PANEL WITH GFR
BUN: 24 mg/dL — AB (ref 6–23)
CHLORIDE: 102 meq/L (ref 96–112)
CO2: 26 mEq/L (ref 19–32)
Calcium: 9.3 mg/dL (ref 8.4–10.5)
Creat: 1.35 mg/dL — ABNORMAL HIGH (ref 0.50–1.10)
GFR, EST AFRICAN AMERICAN: 47 mL/min — AB
GFR, Est Non African American: 40 mL/min — ABNORMAL LOW
Glucose, Bld: 76 mg/dL (ref 70–99)
Potassium: 4.6 mEq/L (ref 3.5–5.3)
Sodium: 139 mEq/L (ref 135–145)

## 2014-09-06 MED ORDER — PHENOBARBITAL 64.8 MG PO TABS: 129.6000 mg | ORAL_TABLET | Freq: Every day | ORAL | Status: DC

## 2014-09-06 NOTE — Patient Instructions (Signed)
Thank you for visiting Sherry [name] [name].  I have sent a refill prescription of Phenobarbital to [facility]. Please let Sherry [name] know if you need any other medications refilled.  I have asked Golden Hurter, our social services worker, to speak with you about helping you with transportation. We can refer you for colonoscopy when this issue is resolved.  Today we are checking a blood test called BMP to assess your kidney function.  We will follow up with you in 3 months.  Thank you for bringing your medicines today. This helps Sherry [name] keep you safe from mistakes.

## 2014-09-06 NOTE — Assessment & Plan Note (Signed)
Patient had a colonoscopy on 11/26/2006 which showed small internal and external hemorrhoids. GI at the time recommended repeat colonoscopy in 5 years. She has not had one since then. Patient was also referred for mammogram in April, which was not done. She states that she has difficulty with transportation. Her daughter is her main source of transport, but her schedule has been busy since starting a new job. Discussed with the patient about having colonoscopy, mammogram as well as DEXA completed if we can resolve her transport issues. She states that she is willing to have these tests once this issue is resolved.  -Referral to [place] for possible transportation options. Will refer for colonoscopy once this issue is resolved.

## 2014-09-06 NOTE — Progress Notes (Signed)
Internal Medicine Clinic Attending  I saw and evaluated the patient.  I personally confirmed the key portions of the history and exam documented by Dr. Patel,Vishal and I reviewed pertinent patient test results.  The assessment, diagnosis, and plan were formulated together and I agree with the documentation in the resident's note.  

## 2014-09-06 NOTE — Assessment & Plan Note (Addendum)
Patient denies any symptoms of dyspnea, orthopnea, SOB, lightheadedness, syncope, fatigue, or chest pain. She does mention that she sees occasional swelling in her legs, but not too often. She is restricting her fluids to 1 liter a day, wears compression stockings daily, and elevates her feet at night. Her weight is down to 156 lbs compared to 164 lbs in April. TTE in January 2016 showed EF of 65-70%.  -Continue Lasix 80 mg once daily -BMP today -Continue fluid restriction, compression socks, and elevation. -Continue to follow with cardiology -F/U in 3 months.

## 2014-09-06 NOTE — Telephone Encounter (Signed)
Prescription for Phenobarbital 64.8 mg tablets 2 po daily (129.6 mg ) per order of Dr. Zada Finders # 180 no refills called to [facility] [address].  Sander Nephew, RN 09/06/2014 3:30PM.

## 2014-09-06 NOTE — Assessment & Plan Note (Signed)
BP Readings from Last 3 Encounters:  09/06/14 158/62  06/06/14 139/62  05/05/14 138/68   Patient checks her BP at home daily with recent readings in the 150s/60s and states that she is compliant with her medication. She was started on Lisinopril 10 mg several months ago. Last creatinine in April was 1.64, which is near or slightly above her baseline. -Continue current medications: Amlodipine 5 mg twice daily, Lisinopril 10 mg once daily, Lasix 80 mg once daily -Continue to check BP at home and current diet -Check BMET today

## 2014-09-06 NOTE — Progress Notes (Signed)
Sherry [name] was referred to CSW for transportation assistance.  CSW met with pt and spouse following her scheduled [facility] appointment.  Sherry [name] states she has put off too many medical procedures/appointments due to transportation issues.  Today, pt's sister had to reschedule an appointment to transport pt/spouse today.  CSW discuss several options available.  Pt is familiar with [employer] and potentially completed an application but states "my daughter does not want [place] to ride [employer] alone".  Lobbyist provided along with [employer].  CSW informed pt/spouse [employer] is similar to [employer], CSW will check to confirm if [employer] still has funding for [employer] transportation.  CSW encouraged Sherry [name] when it is time to re-enroll, pt may want to look into Medicare programs that offer medical transportation as there are some plans that were available this year.  CSW provided letter with contact information on the above services.   Encouraged pt to discuss with daughter and contact CSW for additional questions.  Pt and spouse notified CSW is available to assist as needed.  Pt thankful and denied add'l social work needs at this time.

## 2014-09-06 NOTE — Progress Notes (Signed)
Patient ID: Sherry [name], female   DOB: March 23, 1945, 69 y.o.   MRN: [id number]   Subjective:   Patient ID: Sherry [name] female   DOB: 12/10/1945 69 y.o.   MRN: [id number]  HPI: [name] is a 69 y.o. pleasant lady with PMH of severe aortic stenosis, CAD w/ 3 vessel disease, HTN, HLD, epilepsy, CKD stage 3, chronic diastolic HF, h/o bradycardia, and h/o AV block 2:1. She is visiting [place] after 3 months for a regular follow up and medication refill. She is accompanied by her husband.  Please see problem list for details.    Past Medical History  Diagnosis Date  . Aortic stenosis 12/14    Severe- mean grad 52, peak 85  . Hypertension   . Seizure disorder   . Hyperlipidemia   . Chronic diastolic heart failure   . CKD (chronic kidney disease)     a. baseline CKD stage III  . Morbid obesity   . Anemia   . Bilateral lower extremity edema   . Mobitz (type) I (Wenckebach's) atrioventricular block     a. 01/2013 - asymptomatic.  . Bradycardia     a. 01/2013 - asymptomatic.  [name] Coronary artery disease   . Heart murmur   . GERD (gastroesophageal reflux disease)   . Seizures   . Headache(784.0)   . Arthritis   . TIA (transient ischemic attack) 2014    pt stated she had "mini strokes"   Current Outpatient Prescriptions  Medication Sig Dispense Refill  . amLODipine (NORVASC) 5 MG tablet Take 2 tablets (10 mg total) by mouth daily. 90 tablet 3  . aspirin 81 MG EC tablet Take 81 mg by mouth daily.     . Calcium Carb-Cholecalciferol (CALCIUM 500 +D) 500-400 MG-UNIT TABS Take 1 tablet by mouth 2 (two) times daily.    . furosemide (LASIX) 80 MG tablet TAKE 1 TABLET BY MOUTH EVERY DAY 30 tablet 1  . lisinopril (PRINIVIL,ZESTRIL) 10 MG tablet Take 1 tablet (10 mg total) by mouth daily. 30 tablet 2  . PHENobarbital (LUMINAL) 64.8 MG tablet Take 2 tablets (129.6 mg total) by mouth daily. 180 tablet 0  . potassium chloride (K-DUR) 10 MEQ tablet Take 2 tablets (20 mEq total) by  mouth daily. 60 tablet 2  . pravastatin (PRAVACHOL) 40 MG tablet Take 1 tablet (40 mg total) by mouth daily. 30 tablet 11  . zonisamide (ZONEGRAN) 100 MG capsule Take 200 mg by mouth at bedtime.     No current facility-administered medications for this visit.   Family History  Problem Relation Age of Onset  . Hypertension     History   Social History  . Marital Status: Married    Spouse Name: N/A  . Number of Children: N/A  . Years of Education: N/A   Social History Main Topics  . Smoking status: Former Smoker    Types: Cigarettes    Quit date: 06/03/2000  . Smokeless tobacco: Not on file  . Alcohol Use: No  . Drug Use: No  . Sexual Activity: Not Currently   Other Topics Concern  . None   Social History Narrative   Review of Systems: Review of Systems  Constitutional: Negative for fever, chills and malaise/fatigue.  Eyes:       No changes in vision.  Respiratory: Negative for cough, shortness of breath and wheezing.   Cardiovascular: Negative for chest pain, palpitations, orthopnea and claudication.       Positive for occasional leg swelling. No syncope.  Gastrointestinal: Positive for constipation. Negative for nausea, vomiting, diarrhea and blood in stool.  Genitourinary: Negative for dysuria, urgency, frequency and hematuria.  Musculoskeletal: Negative for falls.  Neurological: Negative for dizziness, focal weakness, seizures and headaches.     Objective:  Physical Exam: Filed Vitals:   09/06/14 1009  BP: 158/62  Pulse: 50  Temp: 98 F (36.7 C)  TempSrc: Oral  Height: 5\' 1"  (1.549 m)  Weight: 156 lb 12.8 oz (71.124 kg)  SpO2: 100%   Physical Exam  Constitutional: She is oriented to person, place, and time. She appears well-developed and well-nourished.  HENT:  Head: Normocephalic and atraumatic.  Eyes: Conjunctivae and EOM are normal.  Cardiovascular: Regular rhythm and intact distal pulses.  Bradycardia present.   Murmur heard.  Systolic murmur is  present  Systolic murmur loudest at RUS border.  Pulmonary/Chest: Effort normal and breath sounds normal. No respiratory distress. She has no wheezes. She has no rales.  Abdominal: Soft. There is no tenderness.  Musculoskeletal: She exhibits no edema.  Uses a walker to ambulate.  Neurological: She is alert and oriented to person, place, and time.  Skin: Skin is warm.  Psychiatric: She has a normal mood and affect. Her behavior is normal.    Assessment & Plan:  Please see problem based charting for current assessment and plan.

## 2014-09-06 NOTE — Assessment & Plan Note (Signed)
Patient has not had a seizure in quite some time. She is seen at [facility] for epilepsy.  Will refill Phenobarbital 129.6 mg daily, continue zonisamide 200 mg daily at night.  Continue to follow up with [facility].

## 2014-09-07 ENCOUNTER — Other Ambulatory Visit: Payer: Self-pay | Admitting: Internal Medicine

## 2014-09-27 ENCOUNTER — Other Ambulatory Visit: Payer: Self-pay | Admitting: Cardiovascular Disease

## 2014-10-04 ENCOUNTER — Telehealth: Payer: Self-pay | Admitting: Licensed Clinical Social Worker

## 2014-10-04 ENCOUNTER — Encounter: Payer: Self-pay | Admitting: Licensed Clinical Social Worker

## 2014-10-04 NOTE — Telephone Encounter (Signed)
CSW received voicemail from pt's daughter requesting information regarding transportation.  CSW returned call to Ms. Brewer and provided the information discussed with Ms. Alessandra on 09/06/14.  Daughter concerned regarding her mother/father riding the [employer] bus; pt had a prior experience when she left her medications on the [employer] bus.  Daughter concerned regarding pt's out of county appointments and pt being unable to ride the [employer] bus.  CSW provided daugther with the letter provided to Ms. Chavero, [employer] application Part A&B, information on [facility], information on  [employer], information on [place] regarding potential [employer] with transportation, application for [employer] through [place] and self addressed envelopes for both [facility] and [employer].

## 2014-10-13 ENCOUNTER — Other Ambulatory Visit: Payer: Self-pay | Admitting: Internal Medicine

## 2014-10-14 ENCOUNTER — Other Ambulatory Visit: Payer: Self-pay | Admitting: Internal Medicine

## 2014-10-19 ENCOUNTER — Encounter: Payer: Self-pay | Admitting: Licensed Clinical Social Worker

## 2014-10-19 NOTE — Progress Notes (Signed)
CSW received completed PART A of the [employer] application and signed ROI PART B by mail from pt/daughter.  CSW completed and signed PART B.  Completed [employer] application faxed to  [employer] today.

## 2014-10-21 ENCOUNTER — Other Ambulatory Visit: Payer: Self-pay | Admitting: Cardiovascular Disease

## 2014-10-30 ENCOUNTER — Encounter: Payer: Self-pay | Admitting: Internal Medicine

## 2014-11-02 ENCOUNTER — Encounter: Payer: Self-pay | Admitting: Internal Medicine

## 2014-11-04 ENCOUNTER — Telehealth: Payer: Self-pay | Admitting: Internal Medicine

## 2014-11-04 NOTE — Telephone Encounter (Signed)
Called patient and spoke to her directly. She is stilling willing to have mammogram completed, is awaiting transportation issue to be resolved. Hopefully this will be soon, as completed [employer] application has been sent to [employer] by Golden Hurter, LCSW. Appreciate all the help from Ms. Jeralyn Ruths.

## 2014-11-08 ENCOUNTER — Telehealth: Payer: Self-pay | Admitting: Licensed Clinical Social Worker

## 2014-11-08 NOTE — Telephone Encounter (Signed)
CSW received call from Ms. Brewer inquiring about pt's [employer] application.  Daughter requesting CSW to follow up with pt.  CSW placed call to Ms. Pennisi.  Pt informed Part A and B faxed to [employer] on 10/19/14.  CSW provided contact number to [employer] eligibility for pt to follow up.

## 2014-11-15 ENCOUNTER — Telehealth: Payer: Self-pay | Admitting: Cardiovascular Disease

## 2014-11-15 NOTE — Telephone Encounter (Signed)
LMTCB

## 2014-11-15 NOTE — Telephone Encounter (Signed)
New message    [facility] calling     Patient has weight gain  5.6 lbs in 3 days. Patients denied symptoms   Will be faxing over weight graft

## 2014-11-17 ENCOUNTER — Ambulatory Visit (INDEPENDENT_AMBULATORY_CARE_PROVIDER_SITE_OTHER): Payer: Medicare Other | Admitting: Internal Medicine

## 2014-11-17 ENCOUNTER — Encounter: Payer: Self-pay | Admitting: Internal Medicine

## 2014-11-17 ENCOUNTER — Other Ambulatory Visit: Payer: Self-pay | Admitting: Internal Medicine

## 2014-11-17 VITALS — BP 154/71 | HR 66 | Temp 98.2°F | Ht 61.0 in | Wt 154.3 lb

## 2014-11-17 DIAGNOSIS — I10 Essential (primary) hypertension: Secondary | ICD-10-CM

## 2014-11-17 DIAGNOSIS — Z87891 Personal history of nicotine dependence: Secondary | ICD-10-CM

## 2014-11-17 DIAGNOSIS — Z0000 Encounter for general adult medical examination without abnormal findings: Secondary | ICD-10-CM

## 2014-11-17 DIAGNOSIS — Z79899 Other long term (current) drug therapy: Secondary | ICD-10-CM

## 2014-11-17 DIAGNOSIS — I5032 Chronic diastolic (congestive) heart failure: Secondary | ICD-10-CM

## 2014-11-17 NOTE — Progress Notes (Signed)
Medicine attending: I personally interviewed and briefly examined this patient, and reviewed pertinent clinical laboratory  data  with resident physician Dr.Vishal Posey Pronto and we discussed a  management plan.

## 2014-11-17 NOTE — Assessment & Plan Note (Signed)
Patient's transportation issues are now resolved and agrees to referral for Mammogram. Patient had colonoscopy on 11/26/2006 which showed small internal and external hemorrhoids and GI at that time recommended repeat colonoscopy in 5 years. However, since no polyps were reported, I feel we can wait until her 10 year follow in 2018 for repeat colonoscopy. -Referral for screening mammogram

## 2014-11-17 NOTE — Progress Notes (Signed)
Patient ID: Sherry [name], female   DOB: November 21, 1945, 69 y.o.   MRN: [id number]   Subjective:   Patient ID: Sherry [name] female   DOB: Sep 11, 1945 69 y.o.   MRN: [id number]  HPI: Sherry [name] is a 69 y.o. with PMH as listed below who presents for follow up visit for management of HTN and for mammogram referral.   She has history of chronic diastolic CHF and there was concern about recent weight gain. Patient brought her daily weight log and reports a one day weight gain of 4 pounds from 154 to 158 back down to 153 the next day. She denies any symptoms at that time of CP, SOB, DOE, PND, cough, or lower extremity swelling. She is taking Lasix 80 mg daily, wears compression stockings, elevates her legs at night above the head, and limits fluid intake to 1L per day.  Patient currently takes Amlodipine 5 mg daily, Lisinopril 10 mg daily, and Lasix for HTN. She states compliance to these medications.  Past Medical History  Diagnosis Date  . Aortic stenosis 12/14    Severe- mean grad 52, peak 85  . Hypertension   . Seizure disorder   . Hyperlipidemia   . Chronic diastolic heart failure   . CKD (chronic kidney disease)     a. baseline CKD stage III  . Morbid obesity   . Anemia   . Bilateral lower extremity edema   . Mobitz (type) I (Wenckebach's) atrioventricular block     a. 01/2013 - asymptomatic.  . Bradycardia     a. 01/2013 - asymptomatic.  [name] Coronary artery disease   . Heart murmur   . GERD (gastroesophageal reflux disease)   . Seizures   . Headache(784.0)   . Arthritis   . TIA (transient ischemic attack) 2014    pt stated she had "mini strokes"   Current Outpatient Prescriptions  Medication Sig Dispense Refill  . amLODipine (NORVASC) 5 MG tablet Take 2 tablets (10 mg total) by mouth daily. (Patient taking differently: Take 5 mg by mouth daily. ) 90 tablet 3  . aspirin 81 MG EC tablet Take 81 mg by mouth daily.     . Calcium Carb-Cholecalciferol (CALCIUM 500 +D)  500-400 MG-UNIT TABS Take 1 tablet by mouth 2 (two) times daily.    . furosemide (LASIX) 80 MG tablet TAKE 1 TABLET BY MOUTH EVERY DAY( NEEDS TO SCHEDULE AN APPOINTMENT) 30 tablet 0  . lisinopril (PRINIVIL,ZESTRIL) 10 MG tablet TAKE 1 TABLET BY MOUTH DAILY 30 tablet 11  . PHENobarbital (LUMINAL) 64.8 MG tablet Take 2 tablets (129.6 mg total) by mouth daily. 180 tablet 0  . potassium chloride (K-DUR) 10 MEQ tablet TAKE 2 TABLETS BY MOUTH DAILY 60 tablet 0  . pravastatin (PRAVACHOL) 40 MG tablet Take 1 tablet (40 mg total) by mouth daily. 30 tablet 11  . zonisamide (ZONEGRAN) 100 MG capsule Take 200 mg by mouth at bedtime.     No current facility-administered medications for this visit.   Family History  Problem Relation Age of Onset  . Hypertension     Social History   Social History  . Marital Status: Married    Spouse Name: N/A  . Number of Children: N/A  . Years of Education: N/A   Social History Main Topics  . Smoking status: Former Smoker    Types: Cigarettes    Quit date: 06/03/2000  . Smokeless tobacco: None  . Alcohol Use: No  . Drug Use: No  . Sexual Activity: Not  Currently   Other Topics Concern  . None   Social History Narrative   Review of Systems: Review of Systems  Constitutional: Negative for fever, chills, weight loss and malaise/fatigue.  Respiratory: Negative for cough, sputum production, shortness of breath and wheezing.   Cardiovascular: Negative for chest pain, palpitations, orthopnea, leg swelling and PND.  Gastrointestinal: Negative for nausea, vomiting, abdominal pain and diarrhea.  Genitourinary: Negative for dysuria.  Musculoskeletal: Negative for falls.  Neurological: Negative for dizziness, tingling, weakness and headaches.    Objective:  Physical Exam: Filed Vitals:   11/17/14 1429  BP: 154/71  Pulse: 66  Temp: 98.2 F (36.8 C)  TempSrc: Oral  Height: 5\' 1"  (1.549 m)  Weight: 154 lb 4.8 oz (69.99 kg)  SpO2: 100%   Physical Exam    Constitutional: She is oriented to person, place, and time. She appears well-developed and well-nourished. No distress.  Elderly lady, pleasant, sitting in wheelchair.  HENT:  Head: Normocephalic and atraumatic.  Cardiovascular: Normal rate and regular rhythm.   Systolic murmur [id number] heard best at upper sternal borders.  Pulmonary/Chest: Effort normal and breath sounds normal. No respiratory distress. She has no wheezes. She has no rales.  Abdominal: Soft. There is no tenderness.  Musculoskeletal: She exhibits no edema or tenderness.  Neurological: She is alert and oriented to person, place, and time.  Skin: Skin is warm.    Assessment & Plan:  Please see problem based charting for assessment and plan.

## 2014-11-17 NOTE — Assessment & Plan Note (Signed)
She has history of chronic diastolic CHF and there was concern about recent weight gain. Patient brought her daily weight log and reports a one day weight gain of 4 pounds from 154 to 158 back down to 153 the next day. She denies any symptoms at that time of CP, SOB, DOE, PND, cough, or lower extremity swelling. She is taking Lasix 80 mg daily, wears compression stockings, elevates her legs at night above the head, and limits fluid intake to 1L per day.  Weight today is 154.3, 156 on last visit in July. She is doing well and controlled at this time. -Continue Lasix 80 mg once daily -Continue compression socks, elevation, and fluid restriction -Patient advised to make appointment with her Cardiologist as she has not seen for almost a year now -f/u in 3 months

## 2014-11-17 NOTE — Patient Instructions (Signed)
It was a pleasure to see you again Ms. Disch.  I will send a referral to have the Mammogram done.  Please try to make an appointment with your Cardiologist, Dr. Burt Knack.

## 2014-11-17 NOTE — Telephone Encounter (Signed)
Chart reviewed and pt is seeing primary care today.  I spoke with pt and she denies any symptoms and reports she is feeling fine.  She is going to primary care this afternoon and I asked her to make doctor aware of weight gain at appt today.  I have not been able to locate fax of weights sent from [facility]. I placed call to [facility] at [facility] and made her aware we had not received fax and that pt is seeing primary care today

## 2014-11-17 NOTE — Assessment & Plan Note (Signed)
BP Readings from Last 3 Encounters:  11/17/14 154/71  09/06/14 158/62  06/06/14 139/62  Patient currently takes Amlodipine 5 mg daily, Lisinopril 10 mg daily, and Lasix for HTN. She states compliance to these medications. Last creatinine in July was 1.35, improved from 1.64 in April. -Continue current medications -Continue to check BP at home and current diet

## 2014-11-22 ENCOUNTER — Other Ambulatory Visit: Payer: Self-pay | Admitting: Cardiovascular Disease

## 2014-11-23 ENCOUNTER — Other Ambulatory Visit: Payer: Self-pay | Admitting: *Deleted

## 2014-12-05 ENCOUNTER — Other Ambulatory Visit: Payer: Self-pay | Admitting: Cardiovascular Disease

## 2014-12-08 ENCOUNTER — Other Ambulatory Visit: Payer: Self-pay | Admitting: Internal Medicine

## 2014-12-09 NOTE — Telephone Encounter (Signed)
Pt see neuro at [facility] and they Rx 6 month supply in Jan and also Rx Zonisamide 300 mg daily. We have not gotten phenobarb level in almost 2 yrs and [facility] last got level Dec 2015. She needs to call [facility] and keep all of her sz care at one facility.

## 2014-12-12 ENCOUNTER — Other Ambulatory Visit: Payer: Self-pay | Admitting: Internal Medicine

## 2014-12-12 ENCOUNTER — Encounter: Payer: Self-pay | Admitting: Internal Medicine

## 2014-12-13 ENCOUNTER — Other Ambulatory Visit: Payer: Self-pay | Admitting: Internal Medicine

## 2014-12-13 MED ORDER — PHENOBARBITAL 64.8 MG PO TABS: 129.6000 mg | ORAL_TABLET | Freq: Every day | ORAL | Status: DC

## 2014-12-14 NOTE — Telephone Encounter (Signed)
Call from pt's dtr-a little upset that patient has not received her refill.  Pt's pcp did authorize refill, so rx was called into pharmacy. Phone call complete.Despina Hidden Cassady10/26/201610:43 AM

## 2014-12-21 ENCOUNTER — Other Ambulatory Visit: Payer: Self-pay | Admitting: Internal Medicine

## 2014-12-25 ENCOUNTER — Other Ambulatory Visit: Payer: Self-pay | Admitting: Cardiovascular Disease

## 2014-12-29 NOTE — Telephone Encounter (Signed)
No need, pharmacy sent by mistake.

## 2014-12-29 NOTE — Telephone Encounter (Signed)
I refilled this on 10/24, please correct me if I'm wrong.   Thank you, Vishal

## 2015-01-03 ENCOUNTER — Telehealth: Payer: Self-pay | Admitting: Cardiovascular Disease

## 2015-01-03 ENCOUNTER — Encounter: Payer: Self-pay | Admitting: Student

## 2015-01-03 NOTE — Telephone Encounter (Signed)
At this time I have not received fax from [facility]. I left Jamie (RN with [facility]) a message that I have not received fax at this time and she can contact our office in regards to the pt.

## 2015-01-03 NOTE — Telephone Encounter (Signed)
New Message  RN from [facility] will be sending data on pt's recent weight gain to office fax- Rn requested pt's last BP and HR. Please call back and discuss.

## 2015-01-04 NOTE — Telephone Encounter (Signed)
Lm for Roselyn Reef (RN at [facility]) to advise her that they only refilled 14 of Lasix since she hasn't been seen in over a year.   Called pt and made her an appointment to see Sandria Senter on Mon 11/21.  She states that she has to get transportation but can make the appointment.

## 2015-01-04 NOTE — Telephone Encounter (Signed)
New Message  [facility] nurse called on behalf of the pt. She reports that the pt has lost 2 lbs as of today and she is feeling better, Pt's only concern is about lasix 80 mg Dr. Burt Knack fills that RX for her usually for a yearly suppy, however,  he only gave her 76 when it was last filled and she is not sure why.  Request a call back to discuss.

## 2015-01-05 ENCOUNTER — Encounter: Payer: Self-pay | Admitting: Cardiology

## 2015-01-06 ENCOUNTER — Other Ambulatory Visit: Payer: Self-pay | Admitting: Cardiovascular Disease

## 2015-01-09 ENCOUNTER — Encounter: Payer: Self-pay | Admitting: Cardiology

## 2015-01-09 ENCOUNTER — Ambulatory Visit (INDEPENDENT_AMBULATORY_CARE_PROVIDER_SITE_OTHER): Payer: Medicare Other | Admitting: Cardiology

## 2015-01-09 VITALS — BP 132/70 | HR 53 | Ht 61.0 in | Wt 155.0 lb

## 2015-01-09 DIAGNOSIS — I10 Essential (primary) hypertension: Secondary | ICD-10-CM

## 2015-01-09 DIAGNOSIS — I2510 Atherosclerotic heart disease of native coronary artery without angina pectoris: Secondary | ICD-10-CM

## 2015-01-09 DIAGNOSIS — I5032 Chronic diastolic (congestive) heart failure: Secondary | ICD-10-CM

## 2015-01-09 DIAGNOSIS — I350 Nonrheumatic aortic (valve) stenosis: Secondary | ICD-10-CM | POA: Diagnosis not present

## 2015-01-09 DIAGNOSIS — E785 Hyperlipidemia, unspecified: Secondary | ICD-10-CM | POA: Diagnosis not present

## 2015-01-09 DIAGNOSIS — I2584 Coronary atherosclerosis due to calcified coronary lesion: Secondary | ICD-10-CM

## 2015-01-09 DIAGNOSIS — I441 Atrioventricular block, second degree: Secondary | ICD-10-CM

## 2015-01-09 DIAGNOSIS — R001 Bradycardia, unspecified: Secondary | ICD-10-CM

## 2015-01-09 MED ORDER — FUROSEMIDE 80 MG PO TABS
80.0000 mg | ORAL_TABLET | Freq: Every day | ORAL | Status: DC
Start: 2015-01-09 — End: 2015-03-09

## 2015-01-09 MED ORDER — PRAVASTATIN SODIUM 40 MG PO TABS: 40.0000 mg | ORAL_TABLET | Freq: Every day | ORAL | Status: DC

## 2015-01-09 MED ORDER — LISINOPRIL 10 MG PO TABS: 10.0000 mg | ORAL_TABLET | Freq: Every day | ORAL | Status: DC

## 2015-01-09 MED ORDER — POTASSIUM CHLORIDE ER 10 MEQ PO TBCR: 20.0000 meq | EXTENDED_RELEASE_TABLET | Freq: Every day | ORAL | Status: DC

## 2015-01-09 MED ORDER — AMLODIPINE BESYLATE 5 MG PO TABS: 5.0000 mg | ORAL_TABLET | Freq: Every day | ORAL | Status: DC

## 2015-01-09 NOTE — Progress Notes (Signed)
Cardiology Office Note   Date:  01/09/2015   ID:  Sherry, [name] 1945/03/02, MRN [id number]  PCP:  Zada Finders, MD  Cardiologist:  Dr. Burt Knack    Chief Complaint  Patient presents with  . Aortic Stenosis    no complaints.  . Coronary Artery Disease      History of Present Illness: Sherry [name] is a 69 y.o. female who presents for her CAD and severe AS.    She has a hx of coronary artery disease, severe aortic stenosis, and conduction system disease with history of 2-1 AV block. She's been managed conservatively in the setting of multiple comorbid medical conditions, previous stroke, and limited mobility. As part of her evaluation for aortic stenosis, she underwent cardiac catheterization in 2015 demonstrating severe multivessel coronary artery disease. She has seen Dr. Rayann Heman for her hx 2:1 block.    Today she is here due to being out of meds. In next day or so.  She is doing well, no chest pain, no SOB. No lightheadedness and no syncope.  She is in a wheelchair due to sever arthritis of both knees and unable to have surgery.  She does her housekeeping while in wheelchair. She sweeps and does housework without problems.   Last echo was in 02/2014, she is to follow up with Dr. Roxy Manns next year. .    Study Conclusions 02/2014  - Left ventricle: The cavity size was normal. Wall thickness was increased in a pattern of severe LVH. Systolic function was vigorous. The estimated ejection fraction was in the range of 65% to 70%. Doppler parameters are consistent with abnormal left ventricular relaxation (grade 1 diastolic dysfunction). - Aortic valve: AV is thickened, heavily calcified with restricted motion Peak and mean gradients through the valve are 38 and 23 mm Hg respectively conssitent with mild AS. Leaflets are difficult to see. In short axis motion is identified to suggest moderate AS but again, calcification is significant. . These findings  are very different from gradients found in echo done in 2014, But, gradients do correspond to those found on recent cath. Consider TEE to further define. Valve area (VTI): 1.05 cm^2. Valve area (Vmean): 1.15 cm^2. - Mitral valve: There was mild regurgitation. - Left atrium: The atrium was mildly dilated. - Pericardium, extracardiac: A small pericardial effusion was identified.   Past Medical History  Diagnosis Date  . Aortic stenosis 12/14    Severe- mean grad 52, peak 85  . Hypertension   . Seizure disorder ([place])   . Hyperlipidemia   . Chronic diastolic heart failure ([place])   . CKD (chronic kidney disease)     a. baseline CKD stage III  . Morbid obesity ([place])   . Anemia   . Bilateral lower extremity edema   . Mobitz (type) I (Wenckebach's) atrioventricular block     a. 01/2013 - asymptomatic.  . Bradycardia     a. 01/2013 - asymptomatic.  [name] Coronary artery disease   . Heart murmur   . GERD (gastroesophageal reflux disease)   . Seizures ([place])   . Headache(784.0)   . Arthritis   . TIA (transient ischemic attack) 2014    pt stated she had "mini strokes"    Past Surgical History  Procedure Laterality Date  . Abdominal hysterectomy    . Left and right heart catheterization with coronary angiogram N/A 05/24/2013    Procedure: LEFT AND RIGHT HEART CATHETERIZATION WITH CORONARY ANGIOGRAM;  Surgeon: Sinclair Grooms, MD;  Location: [facility] CATH  LAB;  Service: Cardiovascular;  Laterality: N/A;     Current Outpatient Prescriptions  Medication Sig Dispense Refill  . amLODipine (NORVASC) 5 MG tablet Take 1 tablet (5 mg total) by mouth daily. 90 tablet 3  . aspirin 81 MG EC tablet Take 81 mg by mouth daily.     . Calcium Carb-Cholecalciferol (CALCIUM 500 +D) 500-400 MG-UNIT TABS Take 1 tablet by mouth daily.     . furosemide (LASIX) 80 MG tablet Take 1 tablet (80 mg total) by mouth daily. 90 tablet 3  . lisinopril (PRINIVIL,ZESTRIL) 10 MG tablet Take 1 tablet (10 mg total)  by mouth daily. 90 tablet 3  . PHENobarbital (LUMINAL) 64.8 MG tablet Take 2 tablets (129.6 mg total) by mouth daily. 180 tablet 0  . potassium chloride (K-DUR) 10 MEQ tablet Take 2 tablets (20 mEq total) by mouth daily. 180 tablet 3  . pravastatin (PRAVACHOL) 40 MG tablet Take 1 tablet (40 mg total) by mouth daily. 90 tablet 3  . zonisamide (ZONEGRAN) 100 MG capsule Take 300 mg by mouth at bedtime.      No current facility-administered medications for this visit.    Allergies:   Ibuprofen    Social History:  The patient  reports that she quit smoking about 14 years ago. Her smoking use included Cigarettes. She does not have any smokeless tobacco history on file. She reports that she does not drink alcohol or use illicit drugs.   Family History:  The patient's family history includes Hypertension in an other family member.    ROS:  General:no colds or fevers, no weight changes Skin:no rashes or ulcers HEENT:no blurred vision, no congestion CV:see HPI PUL:see HPI GI:no diarrhea constipation or melena, no indigestion GU:no hematuria, no dysuria MS:no joint pain, no claudication Neuro:no syncope, no lightheadedness Endo:no diabetes, no thyroid disease  Wt Readings from Last 3 Encounters:  01/09/15 155 lb (70.308 kg)  11/17/14 154 lb 4.8 oz (69.99 kg)  09/06/14 156 lb 12.8 oz (71.124 kg)     PHYSICAL EXAM: VS:  BP 132/70 mmHg  Pulse 53  Ht 5\' 1"  (1.549 m)  Wt 155 lb (70.308 kg)  BMI 29.30 kg/m2 , BMI Body mass index is 29.3 kg/(m^2). General:Pleasant affect, NAD Skin:Warm and dry, brisk capillary refill HEENT:normocephalic, sclera clear, mucus membranes moist Neck:supple, no JVD, + bruits radiation of AS Heart:S1S2 RRR with [id number] systolic murmur,no  gallup, rub or click Lungs:clear without rales, rhonchi, or wheezes [id number], non tender, + BS, do not palpate liver spleen or masses Ext:no lower ext edema, 2+ pedal pulses, 2+ radial pulses Neuro:alert and oriented X 3, MAE,  follows commands, + facial symmetry    EKG:  EKG is ordered today. The ekg ordered today demonstrates SB at 53 1st degree av block no acute changes.   Recent Labs: 04/22/2014: Hemoglobin 13.3; Platelets 274 09/06/2014: BUN 24*; Creat 1.35*; Potassium 4.6; Sodium 139    Lipid Panel    Component Value Date/Time   CHOL 205* 03/05/2013 2104   TRIG 87 03/05/2013 2104   HDL 67 03/05/2013 2104   CHOLHDL 3.1 03/05/2013 2104   VLDL 17 03/05/2013 2104   LDLCALC 121* 03/05/2013 2104       Other studies Reviewed: Additional studies/ records that were reviewed today include: previous notes  . Studies:  2D Echo 02/16/2013: Left ventricle: The cavity size was mildly dilated. Wall thickness was increased in a pattern of moderate LVH. Systolic function was normal. The estimated ejection fraction was in the range  of 60% to 65%.  ------------------------------------------------------------ Aortic valve:  Severely calcified leaflets. Doppler: There was severe stenosis.   VTI ratio of LVOT to aortic valve: 0.37. Valve area: 1.18cm^2(VTI). Indexed valve area: 0.57cm^2/m^2 (VTI). Peak velocity ratio of LVOT to aortic valve: 0.43. Valve area: 1.35cm^2 (Vmax). Indexed valve area: 0.65cm^2/m^2 (Vmax).  Mean gradient: 55mm Hg (S). Peak gradient: 39mm Hg (S).  ------------------------------------------------------------ Mitral valve: Severe MAC especially posteriorly Doppler: Trivial regurgitation.  Peak gradient: 51mm Hg (D).  ------------------------------------------------------------ Left atrium: The atrium was mildly dilated.  ------------------------------------------------------------ Atrial septum: No defect or patent foramen ovale was identified.  ------------------------------------------------------------ Right ventricle: The cavity size was normal. Wall thickness was normal. Systolic function was  normal.  ------------------------------------------------------------ Pulmonic valve:  Structurally normal valve.  Cusp separation was normal. Doppler: Transvalvular velocity was within the normal range. Trivial regurgitation.  ------------------------------------------------------------ Tricuspid valve:  Structurally normal valve.  Leaflet separation was normal. Doppler: Transvalvular velocity was within the normal range. Mild regurgitation.  ------------------------------------------------------------ Right atrium: The atrium was normal in size.  ------------------------------------------------------------ Pericardium: A trivial pericardial effusion was identified.  Cardiac catheterization 05/24/2013: HEMODYNAMICS: Aortic pressure 166/70 mmHg; LV pressure 195/12 mmHg; LVEDP 25 mm mercury; RA 7 mmHg; RV 35/7 mmHg; PA 34/12 mmHg; PCWP(mean) 17 mmHg; Cardiac Output 4.97 L per minute (Fick); 3.74 L per minute (thermodilution); Pull back across the aortic valve: left ventricular pressure 196/23 mmHg and aortic pressure 175/753 mmHg; AV gradient 21 mm mercury Peak-to-Peak.; Aortic valve area by thermodilution 1.43 cm square and by Fick 1.90 cm square  ANGIOGRAPHIC DATA: The left main coronary artery is widely patent.  The left anterior descending artery has moderate to heavy proximal and mid calcification with tandem 80 and 50% stenoses noted after the large first septal perforator. The LAD wraps around the left ventricular apex..  The left circumflex artery is large, giving origin to 4 obtuse marginal branches. The second obtuse marginal is the dominant vessel and bifurcates on the mid lateral wall. The first obtuse marginal is small and contains ostial 70-80% narrowing. The mid circumflex contains an eccentric 80% stenosis within a heavily calcified segment. There is 30-50% narrowing in the ostium of the second obtuse marginal. The continuation of the circumflex beyond the  second obtuse marginal contains segmental 85% stenosis before the origin of thE third and fourth obtuse marginalS.  The right coronary artery is dominant and contains severe segmental mid to distal disease with stenoses up to 90%.   LEFT VENTRICULOGRAM: Left ventricular angiogram was done in the 30 RAO projection and revealed normal to small LV cavity with hyperdynamic contractility and an estimated ejection fraction of 70-80%. This is artificially higher than felt due to post PVC increase in contractility.   IMPRESSIONS: 1. Moderate to severe three-vessel coronary disease as outlined above. The circumflex and right coronary are relatively diffusely diseased.  2. Aortic stenosis, with a surprisingly low transvalvular gradient based on direct hemodynamics. This would raise a question about the possibility of contamination of the aortic velocities on echo rom dynamic LV outflow obstruction.  3. Normal to hyperdynamic LV function.  4. Normal PA pressures.  Outpatient Encounter Prescriptions as            ASSESSMENT AND PLAN:   1. Severe aortic stenosis. Per Dr. Burt Knack "The patient is currently asymptomatic. She has complex cardiac disease with multivessel CAD, aortic stenosis, and marked left ventricular hypertrophy. I personally reviewed her echo images again today. The patient has mid-cavitary obliteration of the left ventricle during systole. The aortic  valve is clearly calcified and restricted with a peak velocity in excess of 4 m/s. Her LVOT velocity is increased so that the aortic valve dimensionless index is 0.43. I think she has a combination of myocardial disease and aortic stenosis. I agree that her risk of surgery is high, and it may be best to continue to watch her with medical therapy while she remains asymptomatic."  Pt   Echo in 02/2014: Restricted Ao valve but gradients less than in past. LV function remains vigorous with severe LVH and preserved LVEF.  Will recheck  Echo in Jan and follow up with Dr. Burt Knack in 6 months unless problems occur.   2. Multivessel CAD. Discussion as above. We'll continue with medical therapy which includes aspirin and pravastatin. No beta blocker with her history of AV block. Will check CMP and Lipid panel. Next week.   3. Intermittent 2-1 AV block. The patient has been evaluated by Dr. Rayann Heman. Considering her asymptomatic status and her desire to avoid a pacemaker, we are observing closely for now.  Today SB at 27.  4. Chronic kidney disease, stage III recent SCr 1.35  5. Hypertension with control. Amlodipine 5 mg daily lasix lisinopril     Current medicines are reviewed with the patient today.  The patient Has no concerns regarding medicines.  The following changes have been made:  See above Labs/ tests ordered today include:see above  Disposition:   FU:  see above  Lennie Muckle, NP  01/09/2015 5:53 PM    [facility] [address], [place], [place], [place] Phone: [phone]; Fax: [phone]

## 2015-01-09 NOTE — Patient Instructions (Addendum)
Medication Instructions:  1) Refills of your Amlodipine, Furosemide, Lisinopril, Potassium and Pravachol have been sent to your pharmacy  Labwork: Your physician recommends that you return for lab work in: 1 week (Lipid, CMET)   Testing/Procedures: Your physician has requested that you have an echocardiogram in January. Echocardiography is a painless test that uses sound waves to create images of your heart. It provides your doctor with information about the size and shape of your heart and how well your heart's chambers and valves are working. This procedure takes approximately one hour. There are no restrictions for this procedure.    Follow-Up: Your physician wants you to follow-up in: 6 months with Dr. Burt Knack. You will receive a reminder letter in the mail two months in advance. If you don't receive a letter, please call our office to schedule the follow-up appointment.   Any Other Special Instructions Will Be Listed Below (If Applicable).     If you need a refill on your cardiac medications before your next appointment, please call your pharmacy.

## 2015-01-16 ENCOUNTER — Other Ambulatory Visit (INDEPENDENT_AMBULATORY_CARE_PROVIDER_SITE_OTHER): Payer: Medicare Other | Admitting: *Deleted

## 2015-01-16 DIAGNOSIS — E785 Hyperlipidemia, unspecified: Secondary | ICD-10-CM

## 2015-01-16 DIAGNOSIS — I10 Essential (primary) hypertension: Secondary | ICD-10-CM | POA: Diagnosis not present

## 2015-01-16 LAB — LIPID PANEL
CHOL/HDL RATIO: 2.3 ratio (ref <=5.0)
Cholesterol: 174 mg/dL (ref 125–200)
HDL: 76 mg/dL (ref >=46)
LDL CALC: 86 mg/dL (ref <130)
Triglycerides: 58 mg/dL (ref <150)
VLDL: 12 mg/dL (ref <30)

## 2015-01-16 LAB — COMPREHENSIVE METABOLIC PANEL
ALT: 12 U/L (ref 6–29)
AST: 21 U/L (ref 10–35)
Albumin: 4 g/dL (ref 3.6–5.1)
Alkaline Phosphatase: 89 U/L (ref 33–130)
BUN: 61 mg/dL — AB (ref 7–25)
CHLORIDE: 106 mmol/L (ref 98–110)
CO2: 22 mmol/L (ref 20–31)
CREATININE: 1.88 mg/dL — AB (ref 0.50–0.99)
Calcium: 9 mg/dL (ref 8.6–10.4)
GLUCOSE: 81 mg/dL (ref 65–99)
Potassium: 4.5 mmol/L (ref 3.5–5.3)
SODIUM: 138 mmol/L (ref 135–146)
TOTAL PROTEIN: 7.2 g/dL (ref 6.1–8.1)
Total Bilirubin: 0.3 mg/dL (ref 0.2–1.2)

## 2015-01-18 ENCOUNTER — Telehealth: Payer: Self-pay | Admitting: Cardiovascular Disease

## 2015-01-18 NOTE — Telephone Encounter (Signed)
If weight remains increased, would add furosemide back at normal dose. Will need close follow-up of renal function - would check repeat BMET in 2 weeks to make sure no progressive renal dysfunction.

## 2015-01-18 NOTE — Telephone Encounter (Signed)
New message     FYI Pt has had a 6.6lb wt gain in 2 days.  She does not have any other symptoms.  Pt was taken off one of her fluid pills.  Please call pt with recommendations.

## 2015-01-18 NOTE — Telephone Encounter (Signed)
I spoke with the pt and she does confirm a 6.6 lb weight gain on her scale.  The pt denies SOB, swelling and orthopnea. The pt follows a strict diet in regards to salt and she did not over eat during the holiday. She is taking her furosemide as prescribed.  The pt plans to weigh tomorrow morning and I will contact her to see if her weight continues to increase.  I will forward this information to Dr Burt Knack to review and give recommendations in regards to plan if the pt's weight has increased.

## 2015-01-19 NOTE — Telephone Encounter (Signed)
I spoke with the pt and her weight today was 153.0, yesterday she was 154.  The pt continues to be asymptomatic at this time.  I advised the pt to continue to weigh daily and contact our office if her weight increases or she develops any symptoms of CHF. Pt agreed with plan.  I reviewed the pt's chart through 2015 and cannot locate that she has been on a higher dosage of Furosemide in the past. I will have Dr Burt Knack give specific instructions in regards to Furosemide dosing if her weight does increase.

## 2015-01-19 NOTE — Telephone Encounter (Signed)
Agree 

## 2015-01-22 ENCOUNTER — Other Ambulatory Visit: Payer: Self-pay | Admitting: Internal Medicine

## 2015-01-23 NOTE — Addendum Note (Signed)
Addended by: Marlis Edelson C on: 01/23/2015 02:43 PM   Modules accepted: Orders

## 2015-02-03 ENCOUNTER — Other Ambulatory Visit: Payer: Self-pay | Admitting: Internal Medicine

## 2015-02-03 DIAGNOSIS — Z1231 Encounter for screening mammogram for malignant neoplasm of breast: Secondary | ICD-10-CM

## 2015-02-21 ENCOUNTER — Other Ambulatory Visit: Payer: Self-pay

## 2015-02-21 ENCOUNTER — Ambulatory Visit (HOSPITAL_COMMUNITY): Payer: Medicare Other | Attending: Cardiovascular Disease

## 2015-02-21 DIAGNOSIS — I313 Pericardial effusion (noninflammatory): Secondary | ICD-10-CM | POA: Diagnosis not present

## 2015-02-21 DIAGNOSIS — E785 Hyperlipidemia, unspecified: Secondary | ICD-10-CM | POA: Insufficient documentation

## 2015-02-21 DIAGNOSIS — I059 Rheumatic mitral valve disease, unspecified: Secondary | ICD-10-CM | POA: Diagnosis not present

## 2015-02-21 DIAGNOSIS — I10 Essential (primary) hypertension: Secondary | ICD-10-CM | POA: Insufficient documentation

## 2015-02-21 DIAGNOSIS — I340 Nonrheumatic mitral (valve) insufficiency: Secondary | ICD-10-CM | POA: Diagnosis not present

## 2015-02-21 DIAGNOSIS — I441 Atrioventricular block, second degree: Secondary | ICD-10-CM | POA: Diagnosis not present

## 2015-02-21 DIAGNOSIS — I350 Nonrheumatic aortic (valve) stenosis: Secondary | ICD-10-CM | POA: Insufficient documentation

## 2015-02-21 DIAGNOSIS — I517 Cardiomegaly: Secondary | ICD-10-CM | POA: Diagnosis not present

## 2015-02-22 ENCOUNTER — Telehealth: Payer: Self-pay | Admitting: *Deleted

## 2015-02-22 ENCOUNTER — Ambulatory Visit
Admission: RE | Admit: 2015-02-22 | Discharge: 2015-02-22 | Disposition: A | Discharge status: Home / Self Care | Payer: Medicare Other | Source: Ambulatory Visit | Attending: Internal Medicine | Admitting: Internal Medicine

## 2015-02-22 DIAGNOSIS — I2510 Atherosclerotic heart disease of native coronary artery without angina pectoris: Secondary | ICD-10-CM

## 2015-02-22 DIAGNOSIS — Z1231 Encounter for screening mammogram for malignant neoplasm of breast: Secondary | ICD-10-CM | POA: Diagnosis not present

## 2015-02-22 NOTE — Telephone Encounter (Signed)
-----   Message from Isaiah Serge, NP sent at 02/21/2015  4:07 PM EST ----- Please have BMP in 2 weeks.  See note from Dr. Burt Knack attached.  Thanks.

## 2015-02-22 NOTE — Telephone Encounter (Signed)
Per Cecilie Kicks, NP, called pt to advise her of her ECHO results.  Pt is aware that she needs to come in the week 03/06/15 to have her BMP rechecked and that we needed to do a repeat ECHO in 1 year.  Orders for both are in EPIC.  Pt verbalized understanding.

## 2015-03-03 ENCOUNTER — Other Ambulatory Visit: Payer: Self-pay | Admitting: Cardiovascular Disease

## 2015-03-03 ENCOUNTER — Other Ambulatory Visit: Payer: Self-pay | Admitting: Internal Medicine

## 2015-03-07 NOTE — Telephone Encounter (Signed)
Called to pharm 

## 2015-03-08 ENCOUNTER — Other Ambulatory Visit (INDEPENDENT_AMBULATORY_CARE_PROVIDER_SITE_OTHER): Payer: Medicare Other | Admitting: *Deleted

## 2015-03-08 DIAGNOSIS — I2510 Atherosclerotic heart disease of native coronary artery without angina pectoris: Secondary | ICD-10-CM | POA: Diagnosis not present

## 2015-03-08 LAB — BASIC METABOLIC PANEL
BUN: 48 mg/dL — AB (ref 7–25)
CALCIUM: 9 mg/dL (ref 8.6–10.4)
CHLORIDE: 104 mmol/L (ref 98–110)
CO2: 23 mmol/L (ref 20–31)
CREATININE: 1.95 mg/dL — AB (ref 0.50–0.99)
GLUCOSE: 83 mg/dL (ref 65–99)
Potassium: 5.1 mmol/L (ref 3.5–5.3)
Sodium: 135 mmol/L (ref 135–146)

## 2015-03-09 ENCOUNTER — Other Ambulatory Visit: Payer: Self-pay | Admitting: Internal Medicine

## 2015-03-09 ENCOUNTER — Telehealth: Payer: Self-pay | Admitting: *Deleted

## 2015-03-09 DIAGNOSIS — I5022 Chronic systolic (congestive) heart failure: Secondary | ICD-10-CM

## 2015-03-09 MED ORDER — FUROSEMIDE 80 MG PO TABS: 40.0000 mg | ORAL_TABLET | Freq: Every day | ORAL | Status: DC

## 2015-03-09 NOTE — Telephone Encounter (Signed)
Pt has been made aware of labs.  She will decrease lasix back to 40 mg (1/2 of a 80) and get repeat BMET in 2 weeks.  Order in [place].  Pt verbalized understanding.

## 2015-03-09 NOTE — Telephone Encounter (Signed)
Pt requesting phenobarbital to be filled @ [facility].

## 2015-03-09 NOTE — Telephone Encounter (Signed)
Called pt to let her know that she is to d/c Potassium.  Pt verbalized understanding.

## 2015-03-09 NOTE — Telephone Encounter (Signed)
-----   Message from Isaiah Serge, NP sent at 03/09/2015  2:40 PM EST ----- Yes unless her weight climbs or she gets short of breath then call back. ----- Message -----    From: Jeanann Lewandowsky, RMA    Sent: 03/09/2015   2:28 PM      To: Isaiah Serge, NP  Mickel Baas, I just want to make sure that I understand.. Decrease Lasix to 40 mg and d/c Potassium?  She is currently on 80 mg of Lasix.[name] Please advise so I can call the pt.  Thanks!

## 2015-03-09 NOTE — Telephone Encounter (Signed)
-----   Message from Sherry Serge, NP sent at 03/09/2015  2:21 PM EST ----- Does she have any SOB or wt gain?  If not lets decrease lasix to half a tab a day- 40 mg.  Though if wt gain of 3 lbs in a day or 5 lbs in a week call us. Recheck BMP in 2 weeks.

## 2015-03-10 NOTE — Telephone Encounter (Signed)
This was done and pt was aware

## 2015-03-23 ENCOUNTER — Other Ambulatory Visit: Payer: Medicare Other | Admitting: *Deleted

## 2015-03-23 DIAGNOSIS — I441 Atrioventricular block, second degree: Secondary | ICD-10-CM

## 2015-03-23 DIAGNOSIS — I5032 Chronic diastolic (congestive) heart failure: Secondary | ICD-10-CM | POA: Diagnosis not present

## 2015-03-23 DIAGNOSIS — I350 Nonrheumatic aortic (valve) stenosis: Secondary | ICD-10-CM

## 2015-03-23 DIAGNOSIS — I10 Essential (primary) hypertension: Secondary | ICD-10-CM

## 2015-03-23 DIAGNOSIS — E785 Hyperlipidemia, unspecified: Secondary | ICD-10-CM

## 2015-03-23 LAB — BASIC METABOLIC PANEL
BUN: 28 mg/dL — AB (ref 7–25)
CHLORIDE: 107 mmol/L (ref 98–110)
CO2: 22 mmol/L (ref 20–31)
Calcium: 8.5 mg/dL — ABNORMAL LOW (ref 8.6–10.4)
Creat: 1.56 mg/dL — ABNORMAL HIGH (ref 0.50–0.99)
GLUCOSE: 72 mg/dL (ref 65–99)
POTASSIUM: 4 mmol/L (ref 3.5–5.3)
SODIUM: 139 mmol/L (ref 135–146)

## 2015-03-23 NOTE — Addendum Note (Signed)
Addended by: Eulis Foster on: 03/23/2015 09:14 AM   Modules accepted: Orders

## 2015-03-23 NOTE — Addendum Note (Signed)
Addended by: Eulis Foster on: 03/23/2015 08:55 AM   Modules accepted: Orders

## 2015-03-29 ENCOUNTER — Other Ambulatory Visit: Payer: Self-pay | Admitting: Internal Medicine

## 2015-04-17 ENCOUNTER — Encounter: Payer: Self-pay | Admitting: Thoracic Surgery (Cardiothoracic Vascular Surgery)

## 2015-04-17 ENCOUNTER — Ambulatory Visit (INDEPENDENT_AMBULATORY_CARE_PROVIDER_SITE_OTHER): Payer: Medicare Other | Admitting: Thoracic Surgery (Cardiothoracic Vascular Surgery)

## 2015-04-17 VITALS — BP 152/72 | HR 54 | Resp 16 | Ht 61.0 in | Wt 156.0 lb

## 2015-04-17 DIAGNOSIS — I5032 Chronic diastolic (congestive) heart failure: Secondary | ICD-10-CM | POA: Diagnosis not present

## 2015-04-17 DIAGNOSIS — I25119 Atherosclerotic heart disease of native coronary artery with unspecified angina pectoris: Secondary | ICD-10-CM

## 2015-04-17 DIAGNOSIS — I350 Nonrheumatic aortic (valve) stenosis: Secondary | ICD-10-CM | POA: Diagnosis not present

## 2015-04-17 NOTE — Progress Notes (Signed)
[address]       [place] [address]             [id number]     CARDIOTHORACIC SURGERY OFFICE NOTE  Referring Provider is Sherren Mocha, MD PCP is Zada Finders, MD   HPI:  Patient is a 70 year old African-American female with multiple medical problems who returns to the office today for follow-up of aortic stenosis. She originally presented with symptoms of dizziness and syncope associated with intermittent 2-1 AV block. She was felt to have Mobitz type I AV block and a decision was made to observe her without pacemaker placement. Her symptoms resolved following adjustments in her medical therapy.  Transthoracic echocardiogram performed 02/16/2013 suggested the presence of normal left ventricular systolic function with severe aortic stenosis including peak velocity measured across the aortic valve in excess of 4 m/s and mean transvalvular gradient estimated at 52 mmHg.  I first had the opportunity to see her in consultation on 05/03/2013. She underwent diagnostic cardiac catheterization was found to have severe three-vessel coronary artery disease with high-grade stenosis involving the right coronary artery. Transvalvular gradients measured at the time of catheterization were much lower than that suggested by the transthoracic echocardiogram performed in 2014.  Repeat transthoracic echocardiogram performed last year revealed findings suggestive of only moderate aortic stenosis with peak and mean transvalvular gradients estimated 38 and 23 mmHg respectively.  Because the patient remained asymptomatic at that time a plan for continued medical therapy with observation was made. Since then the patient has continued to do well clinically and she returns to our office for routine follow-up. She states that over the past year she has done well. Her physical mobility is somewhat limited but she reports no symptoms of exertional shortness of breath, chest discomfort, or fatigue. She  states that she is getting around fairly well at home and her exercise tolerance remains stable. She has not had any dizzy spells, syncope, PND, orthopnea, or lower extremity edema.   Current Outpatient Prescriptions  Medication Sig Dispense Refill  . amLODipine (NORVASC) 5 MG tablet Take 1 tablet (5 mg total) by mouth daily. 90 tablet 3  . aspirin 81 MG EC tablet Take 81 mg by mouth daily.     . Calcium Carb-Cholecalciferol (CALCIUM 500 +D) 500-400 MG-UNIT TABS Take 1 tablet by mouth daily.     . furosemide (LASIX) 80 MG tablet Take 0.5 tablets (40 mg total) by mouth daily. 90 tablet 3  . lisinopril (PRINIVIL,ZESTRIL) 10 MG tablet Take 1 tablet (10 mg total) by mouth daily. 90 tablet 3  . PHENobarbital (LUMINAL) 64.8 MG tablet TAKE 2 TABLETS BY MOUTH EVERY DAY 180 tablet 0  . pravastatin (PRAVACHOL) 40 MG tablet Take 1 tablet (40 mg total) by mouth daily. 90 tablet 3  . zonisamide (ZONEGRAN) 100 MG capsule Take 300 mg by mouth at bedtime.      No current facility-administered medications for this visit.      Physical Exam:   BP 152/72 mmHg  Pulse 54  Resp 16  Ht 5\' 1"  (1.549 m)  Wt 156 lb (70.761 kg)  BMI 29.49 kg/m2  SpO2 98%  General:  Well-appearing  Chest:   Clear to auscultation  CV:   Grade 4/6 crescendo decrescendo systolic murmur heard best along the sternal border  Incisions:  n/a  Abdomen:  Soft and nontender  Extremities:  Warm and well-perfused  Diagnostic Tests:  Transthoracic Echocardiography  Patient:  Sherry [name], Sherry [name] MR #:  [id number] Study Date: 02/21/2015 Gender:   F Age:    62 Height:   154.9 cm Weight:   70.3 kg BSA:    1.76 m^2 Pt. Status: Room:  ATTENDING  Aram Beecham   Isaiah Serge REFERRING  Cecilie Kicks R SONOGRAPHER Marygrace Drought, RCS PERFORMING  [facility], Outpatient  cc:  ------------------------------------------------------------------- LV EF: 60% -   65%  ------------------------------------------------------------------- Indications:   Aortic stenosis (I35.0).  ------------------------------------------------------------------- History:  PMH:  Aortic valve disease. Risk factors: 1st and 2nd degree AV block. Hypertension. Dyslipidemia.  ------------------------------------------------------------------- Study Conclusions  - Left ventricle: The cavity size was normal. Wall thickness was increased in a pattern of severe LVH. Systolic function was normal. The estimated ejection fraction was in the range of 60% to 65%. - Aortic valve: 03/07/14 AV gradients 23/38 mean/peak compared to 15 mmHg and 32 mmHg now. There was mild to moderate stenosis. Valve area (VTI): 1 cm^2. Valve area (Vmax): 0.88 cm^2. Valve area (Vmean): 0.99 cm^2. - Mitral valve: Calcified annulus. There was mild regurgitation. Valve area by pressure half-time: 1.58 cm^2. Valve area by continuity equation (using LVOT flow): 1.25 cm^2. - Atrial septum: No defect or patent foramen ovale was identified. - Pericardium, extracardiac: Small to moderate pericardial effusion IVC flat no evidence of tamponade Morphologically LV myocardiium suggests possible amyloidosis clinical correlation suggested.  ------------------------------------------------------------------- Labs, prior tests, procedures, and surgery: Transthoracic echocardiography (03/07/2014).   EF was 65%. Aortic valve: peak gradient of 38 mm Hg and mean gradient of 23 mm Hg.  Transthoracic echocardiography. M-mode, complete 2D, spectral Doppler, and color Doppler. Birthdate: Patient birthdate: Nov 22, 1945. Age: Patient is 70 yr old. Sex: Gender: female. BMI: 29.3 kg/m^2. Blood pressure:   132/70 Patient status: Outpatient. Study date: Study date: 02/21/2015. Study time: 10:50 AM. Location: [facility]  3  -------------------------------------------------------------------  ------------------------------------------------------------------- Left ventricle: The cavity size was normal. Wall thickness was increased in a pattern of severe LVH. Systolic function was normal. The estimated ejection fraction was in the range of 60% to 65%.  ------------------------------------------------------------------- Aortic valve: 03/07/14 AV gradients 23/38 mean/peak compared to 15 mmHg and 32 mmHg now. Severely calcified leaflets. Doppler: There was mild to moderate stenosis.   VTI ratio of LVOT to aortic valve: 0.35. Valve area (VTI): 1 cm^2. Indexed valve area (VTI): 0.57 cm^2/m^2. Peak velocity ratio of LVOT to aortic valve: 0.31. Valve area (Vmax): 0.88 cm^2. Indexed valve area (Vmax): 0.5 cm^2/m^2. Mean velocity ratio of LVOT to aortic valve: 0.35. Valve area (Vmean): 0.99 cm^2. Indexed valve area (Vmean): 0.56 cm^2/m^2.  Mean gradient (S): 15 mm Hg. Peak gradient (S): 32 mm Hg.  ------------------------------------------------------------------- Aorta: The aorta was normal, not dilated, and non-diseased.  ------------------------------------------------------------------- Mitral valve:  Calcified annulus. Doppler: There was mild regurgitation.  Valve area by pressure half-time: 1.58 cm^2. Indexed valve area by pressure half-time: 0.9 cm^2/m^2. Valve area by continuity equation (using LVOT flow): 1.25 cm^2. Indexed valve area by continuity equation (using LVOT flow): 0.71 cm^2/m^2. Mean gradient (D): 2 mm Hg. Peak gradient (D): 3 mm Hg.  ------------------------------------------------------------------- Left atrium: The atrium was normal in size.  ------------------------------------------------------------------- Atrial septum: No defect or patent foramen ovale was identified.  ------------------------------------------------------------------- Right ventricle: The  cavity size was normal. Wall thickness was normal. Systolic function was normal.  ------------------------------------------------------------------- Pulmonic valve:  Doppler: There was trivial regurgitation.  ------------------------------------------------------------------- Tricuspid valve:  Doppler: There was mild regurgitation.  ------------------------------------------------------------------- Right atrium: The atrium was normal in size.  ------------------------------------------------------------------- Pericardium: Small to moderate pericardial effusion  IVC flat no evidence of tamponade Morphologically LV myocardiium suggests possible amyloidosis clinical correlation suggested.  ------------------------------------------------------------------- Post procedure conclusions Ascending Aorta:  - The aorta was normal, not dilated, and non-diseased.  ------------------------------------------------------------------- Measurements  Left ventricle              Value     Reference LV ID, ED, PLAX chordal      (L)   40.5 mm    43 - 52 LV ID, ES, PLAX chordal      (L)   18.4 mm    23 - 38 LV fx shortening, PLAX chordal      55  %    >=29 LV PW thickness, ED            15  mm    --------- IVS/LV PW ratio, ED            1.02      <=1.3 Stroke volume, 2D             66  ml    --------- Stroke volume/bsa, 2D           37  ml/m^2  --------- LV ejection fraction, 1-p A4C       73  %    --------- LV e&', lateral              5.87 cm/s   --------- LV E/e&', lateral             14.7      --------- LV e&', medial               4.79 cm/s   --------- LV E/e&', medial              18.02     --------- LV e&', average              5.33 cm/s   --------- LV  E/e&', average             16.19     ---------  Ventricular septum            Value     Reference IVS thickness, ED             15.3 mm    ---------  LVOT                   Value     Reference LVOT ID, S                19  mm    --------- LVOT area                 2.84 cm^2   --------- LVOT peak velocity, S           87.2 cm/s   --------- LVOT mean velocity, S           61.2 cm/s   --------- LVOT VTI, S                23.2 cm    ---------  Aortic valve               Value     Reference Aortic valve peak velocity, S       281  cm/s   --------- Aortic valve mean velocity, S       176  cm/s   --------- Aortic valve VTI, S            65.8 cm    ---------  Aortic mean gradient, S          15  mm Hg  --------- Aortic peak gradient, S          32  mm Hg  --------- VTI ratio, LVOT/AV            0.35      --------- Aortic valve area, VTI          1   cm^2   --------- Aortic valve area/bsa, VTI        0.57 cm^2/m^2 --------- Velocity ratio, peak, LVOT/AV       0.31      --------- Aortic valve area, peak velocity     0.88 cm^2   --------- Aortic valve area/bsa, peak        0.5  cm^2/m^2 --------- velocity Velocity ratio, mean, LVOT/AV       0.35      --------- Aortic valve area, mean velocity     0.99 cm^2   --------- Aortic valve area/bsa, mean        0.56 cm^2/m^2 --------- velocity  Aorta                   Value     Reference Aortic root ID, ED            28  mm    ---------  Left atrium                Value     Reference LA ID, A-P, ES              34  mm     --------- LA ID/bsa, A-P              1.93 cm/m^2  <=2.2 LA volume, S               73.9 ml    --------- LA volume/bsa, S             41.9 ml/m^2  --------- LA volume, ES, 1-p A4C          56.5 ml    --------- LA volume/bsa, ES, 1-p A4C        32  ml/m^2  --------- LA volume, ES, 1-p A2C          85.1 ml    --------- LA volume/bsa, ES, 1-p A2C        48.2 ml/m^2  ---------  Mitral valve               Value     Reference Mitral E-wave peak velocity        86.3 cm/s   --------- Mitral A-wave peak velocity        86.8 cm/s   --------- Mitral mean velocity, D          55.3 cm/s   --------- Mitral deceleration time     (H)   310  ms    150 - 230 Mitral pressure half-time         152  ms    --------- Mitral mean gradient, D          2   mm Hg  --------- Mitral peak gradient, D          3   mm Hg  --------- Mitral E/A ratio, peak          1       --------- Mitral valve area, PHT, DP  1.58 cm^2   --------- Mitral valve area/bsa, PHT, DP      0.9  cm^2/m^2 --------- Mitral valve area, LVOT          1.25 cm^2   --------- continuity Mitral valve area/bsa, LVOT        0.71 cm^2/m^2 --------- continuity Mitral annulus VTI, D           52.7 cm    ---------  Systemic veins              Value     Reference Estimated CVP               3   mm Hg  ---------  Right ventricle              Value     Reference TAPSE                   15.6 mm    --------- RV s&', lateral, S             10.9 cm/s   ---------  Legend: (L) and (H) mark values outside specified reference  range.  ------------------------------------------------------------------- Prepared and Electronically Authenticated by  Jenkins Rouge, M.D. 2017-01-03T12:44:48        Impression:  Patient has at least stage B (progressive - moderate) and possibly stage C1 (severe asymptomatic) aortic stenosis with multivessel coronary artery disease.  She remains essentially asymptomatic at this time, although she admits that she is not physically very active because of her numerous other medical problems and limited physical mobility. I have personally reviewed the patient's recent follow-up transthoracic echocardiogram. She has severe thickening with restricted leaflet mobility involving 2 of the 3 leaflets of her aortic valve. Peak velocity across the aortic valve was measured 2.8 m/s corresponding to mean transvalvular gradient estimated at only 15 mmHg. The dimensionless velocity ratio was reported 0.31.  Left ventricular systolic function remains normal with ejection fraction estimated 60-65%.  The patient does have severe left ventricular hypertrophy with likely significant diastolic dysfunction. She could have paradoxical low flow low gradient and normal EF severe aortic stenosis, but the patient claims to remain asymptomatic at this time. Her recent follow-up echocardiogram looks similar to the echocardiogram performed one year ago. The patient also has significant multivessel coronary artery disease but she has never had any symptoms suggestive of angina pectoris and she did not have any significant disease in the left main coronary artery or the left anterior descending coronary artery at the time of her previous catheterization.  Under the circumstances it seems reasonable to continue to follow her on medical therapy although she will need to be followed closely.    Plan:  The patient will continue to follow-up regularly with Dr. Burt Knack. We will plan to see her again in follow-up in one year. The  patient will call and return sooner should she develop worsening symptoms of exertional shortness of breath, chest discomfort, fatigue, dizzy spells, or syncope.   I spent in excess of 15 minutes during the conduct of this office consultation and >50% of this time involved direct face-to-face encounter with the patient for counseling and/or coordination of their care.   Valentina Gu. Roxy Manns, MD 04/17/2015 1:06 PM

## 2015-04-17 NOTE — Patient Instructions (Signed)
Continue all previous medications without any changes at this time  

## 2015-04-19 ENCOUNTER — Encounter: Payer: Self-pay | Admitting: Internal Medicine

## 2015-04-19 ENCOUNTER — Ambulatory Visit (INDEPENDENT_AMBULATORY_CARE_PROVIDER_SITE_OTHER): Payer: Medicare Other | Admitting: Internal Medicine

## 2015-04-19 VITALS — BP 168/68 | HR 62 | Temp 98.2°F | Wt 152.9 lb

## 2015-04-19 DIAGNOSIS — Z1159 Encounter for screening for other viral diseases: Secondary | ICD-10-CM

## 2015-04-19 DIAGNOSIS — Z0000 Encounter for general adult medical examination without abnormal findings: Secondary | ICD-10-CM | POA: Diagnosis not present

## 2015-04-19 DIAGNOSIS — I110 Hypertensive heart disease with heart failure: Secondary | ICD-10-CM | POA: Diagnosis not present

## 2015-04-19 DIAGNOSIS — I350 Nonrheumatic aortic (valve) stenosis: Secondary | ICD-10-CM | POA: Diagnosis not present

## 2015-04-19 DIAGNOSIS — G40909 Epilepsy, unspecified, not intractable, without status epilepticus: Secondary | ICD-10-CM | POA: Diagnosis not present

## 2015-04-19 DIAGNOSIS — I5032 Chronic diastolic (congestive) heart failure: Secondary | ICD-10-CM | POA: Diagnosis not present

## 2015-04-19 DIAGNOSIS — I10 Essential (primary) hypertension: Secondary | ICD-10-CM

## 2015-04-19 NOTE — Progress Notes (Signed)
Patient ID: Sherry [name], female   DOB: 05-Sep-1945, 71 y.o.   MRN: [id number]   Subjective:   Patient ID: Sherry [name] female   DOB: 02-Oct-1945 70 y.o.   MRN: [id number]  HPI: [name] is a 70 y.o. female with PMH as listed below who presents for management of her HTN. Please see problem based charting for status of patient's chronic medical conditions.    Past Medical History  Diagnosis Date  . Aortic stenosis   . Hypertension   . Seizure disorder ([place])   . Hyperlipidemia   . Chronic diastolic heart failure ([place])   . CKD (chronic kidney disease)     a. baseline CKD stage III  . Morbid obesity ([place])   . Anemia   . Bilateral lower extremity edema   . Mobitz (type) I (Wenckebach's) atrioventricular block     a. 01/2013 - asymptomatic.  . Bradycardia     a. 01/2013 - asymptomatic.  [name] Coronary artery disease   . Heart murmur   . GERD (gastroesophageal reflux disease)   . Seizures ([place])   . Headache(784.0)   . Arthritis   . TIA (transient ischemic attack) 2014    pt stated she had "mini strokes"   Current Outpatient Prescriptions  Medication Sig Dispense Refill  . amLODipine (NORVASC) 5 MG tablet Take 1 tablet (5 mg total) by mouth daily. 90 tablet 3  . aspirin 81 MG EC tablet Take 81 mg by mouth daily.     . Calcium Carb-Cholecalciferol (CALCIUM 500 +D) 500-400 MG-UNIT TABS Take 1 tablet by mouth daily.     . furosemide (LASIX) 80 MG tablet Take 0.5 tablets (40 mg total) by mouth daily. 90 tablet 3  . lisinopril (PRINIVIL,ZESTRIL) 10 MG tablet Take 1 tablet (10 mg total) by mouth daily. 90 tablet 3  . PHENobarbital (LUMINAL) 64.8 MG tablet TAKE 2 TABLETS BY MOUTH EVERY DAY 180 tablet 0  . pravastatin (PRAVACHOL) 40 MG tablet Take 1 tablet (40 mg total) by mouth daily. 90 tablet 3  . zonisamide (ZONEGRAN) 100 MG capsule Take 300 mg by mouth at bedtime.      No current facility-administered medications for this visit.   Family History  Problem Relation  Age of Onset  . Hypertension     Social History   Social History  . Marital Status: Married    Spouse Name: N/A  . Number of Children: N/A  . Years of Education: N/A   Social History Main Topics  . Smoking status: Former Smoker    Types: Cigarettes    Quit date: 06/03/2000  . Smokeless tobacco: None  . Alcohol Use: No  . Drug Use: No  . Sexual Activity: Not Currently   Other Topics Concern  . None   Social History Narrative   Review of Systems: Review of Systems  Constitutional: Negative for fever, chills and diaphoresis.  HENT: Negative for congestion and sore throat.   Respiratory: Negative for cough, shortness of breath and wheezing.   Cardiovascular: Negative for chest pain, palpitations and orthopnea.       Occasional mild leg swelling  Gastrointestinal: Negative for nausea, vomiting, abdominal pain, diarrhea, blood in stool and melena.  Genitourinary: Negative for dysuria.  Musculoskeletal: Positive for joint pain. Negative for myalgias, back pain and falls.  Neurological: Negative for dizziness, seizures, loss of consciousness and headaches.    Objective:  Physical Exam: Filed Vitals:   04/19/15 1316  BP: 168/68  Pulse: 62  Temp: 98.2 F (36.8  C)  TempSrc: Oral  Weight: 152 lb 14.4 oz (69.355 kg)  SpO2: 100%   Physical Exam  Constitutional: She is oriented to person, place, and time. No distress.  Pleasant lady, sitting in wheelchair  HENT:  Head: Normocephalic and atraumatic.  Cardiovascular: Normal rate.   Murmur heard.  Systolic murmur is present with a grade of 3/6  Systolic murmur heard best at RUS border radiating to carotids  Pulmonary/Chest: Effort normal. No respiratory distress. She has no wheezes. She has no rales.  Abdominal: Soft. There is no tenderness.  Musculoskeletal: She exhibits no edema.  Neurological: She is alert and oriented to person, place, and time.  Skin: Skin is warm.  Psychiatric: She has a normal mood and affect.     Assessment & Plan:  Please see problem list for assessment and plan.

## 2015-04-19 NOTE — Patient Instructions (Signed)
It was a pleasure to see you today Sherry [name].  We are doing a blood test to screen for Hepatitis C today. I will also order a bone scan to check for osteoporosis and risk for broken bones.  Please continue your current medications as prescribed.

## 2015-04-19 NOTE — Assessment & Plan Note (Signed)
Patient on Phenobarbital and Zonisamide for history of epilepsy. She denies any seizures for quite some time. She follows at [facility] for her medications.  A/P: Controlled on current medications -Continue to follow with [facility] for management

## 2015-04-19 NOTE — Assessment & Plan Note (Signed)
Patient states she received flu shot at [facility]. She is eligible for Hep C and osteoporosis screening and agrees to screening. Her calculated FRAX score for 10 years is 4.3% for major fracture and 0.7% for hip fracture.  A/P: -Check Hep C antibody -Order DEXA

## 2015-04-19 NOTE — Assessment & Plan Note (Signed)
Patient follows with cardiology and had Lasix cut in half recently from 80 mg to 40 mg daily. Her weight today is 152.9 compared to previous weights of 156 and 155. She checks her weight at home daily. She has noticed some mild swelling in her legs with the lower Lasix dose, but this resolves after elevating legs and only occurs occasionally. She wears compression socks every day. She is not on a beta-blocker due to history of AV block. Patient has lost nearly 100 lbs in the last 5 years. Patient says this was intentional, done through diet and exercise, after being told she needed to lose weight if she wanted to survive.  A/P: Weight stable, no rales on lung exam or edema of lower extremities. Patient doing well and under good control at this time. -Continue compression socks, elevation, and fluid restriction. -Continue diet and exercise -Continue Lasix 40 mg daily -Continue to follow with Cardiology

## 2015-04-19 NOTE — Assessment & Plan Note (Signed)
Patient with moderate to severe asymptomatic aortic stenosis. She follows with Cardiology and has seen Dr. Burt Knack and Dr. Roxy Manns with Cardiothoracic surgery recently who are recommending conservative therapy for now as she is asymptomatic.  A/P:  -Continue to follow with Cardiology

## 2015-04-19 NOTE — Assessment & Plan Note (Signed)
Patient taking Amlodipine 5 mg daily, Lisinopril 10 mg daily, and Lasix 40 mg (0.5 of 80 mg tab) daily. BP on arrival was 168/68. Repeat BP is 130/60. Patient reports checking her BP daily at home and keeping a log, but forgot to bring today as she was in a rush.   A/P: BP control stable. Last SCr is 1.56 compared to 1.95 prior. -Continue current medications -Continue checking BP at home -Continue diet and exercise

## 2015-04-20 LAB — HEPATITIS C ANTIBODY

## 2015-04-21 NOTE — Progress Notes (Signed)
Internal Medicine Clinic Attending  Case discussed with Dr. Patel,Vishal soon after the resident saw the patient.  We reviewed the resident's history and exam and pertinent patient test results.  I agree with the assessment, diagnosis, and plan of care documented in the resident's note. 

## 2015-05-02 ENCOUNTER — Telehealth: Payer: Self-pay | Admitting: Internal Medicine

## 2015-05-02 NOTE — Telephone Encounter (Signed)
Pt requesting the nurse to call back regarding aspirin.

## 2015-05-02 NOTE — Telephone Encounter (Signed)
She wanted to make sure she had the right kind of ASA, 81mg  EC, ok'ed, it is on her med list

## 2015-06-06 ENCOUNTER — Other Ambulatory Visit: Payer: Self-pay | Admitting: Internal Medicine

## 2015-06-07 NOTE — Telephone Encounter (Signed)
Last appt was 04/19/15; no f/u appt was scheduled.

## 2015-06-08 NOTE — Telephone Encounter (Signed)
Phenobarbital rx called to [facility].

## 2015-08-30 ENCOUNTER — Other Ambulatory Visit: Payer: Self-pay | Admitting: *Deleted

## 2015-08-30 MED ORDER — PHENOBARBITAL 64.8 MG PO TABS: 129.6000 mg | ORAL_TABLET | Freq: Every day | ORAL | Status: DC

## 2015-09-01 ENCOUNTER — Other Ambulatory Visit: Payer: Self-pay | Admitting: Internal Medicine

## 2015-09-01 NOTE — Telephone Encounter (Signed)
Pt requesting a  Phenobarbital Refill

## 2015-09-01 NOTE — Telephone Encounter (Signed)
Phenobarital rx called to [facility]. Pt called/informed.

## 2015-09-07 ENCOUNTER — Encounter: Payer: Self-pay | Admitting: Internal Medicine

## 2015-09-29 ENCOUNTER — Other Ambulatory Visit: Payer: Self-pay | Admitting: Internal Medicine

## 2015-10-11 ENCOUNTER — Other Ambulatory Visit: Payer: Self-pay | Admitting: Internal Medicine

## 2015-10-11 ENCOUNTER — Encounter: Payer: Self-pay | Admitting: Internal Medicine

## 2015-10-11 ENCOUNTER — Ambulatory Visit (INDEPENDENT_AMBULATORY_CARE_PROVIDER_SITE_OTHER): Payer: Medicare Other | Admitting: Internal Medicine

## 2015-10-11 VITALS — BP 154/68 | HR 43 | Temp 98.0°F | Ht 61.0 in | Wt 152.1 lb

## 2015-10-11 DIAGNOSIS — I5032 Chronic diastolic (congestive) heart failure: Secondary | ICD-10-CM

## 2015-10-11 DIAGNOSIS — M170 Bilateral primary osteoarthritis of knee: Secondary | ICD-10-CM

## 2015-10-11 DIAGNOSIS — Z23 Encounter for immunization: Secondary | ICD-10-CM | POA: Diagnosis not present

## 2015-10-11 DIAGNOSIS — N183 Chronic kidney disease, stage 3 unspecified: Secondary | ICD-10-CM

## 2015-10-11 DIAGNOSIS — M810 Age-related osteoporosis without current pathological fracture: Secondary | ICD-10-CM | POA: Insufficient documentation

## 2015-10-11 DIAGNOSIS — R2689 Other abnormalities of gait and mobility: Secondary | ICD-10-CM

## 2015-10-11 DIAGNOSIS — I130 Hypertensive heart and chronic kidney disease with heart failure and stage 1 through stage 4 chronic kidney disease, or unspecified chronic kidney disease: Secondary | ICD-10-CM

## 2015-10-11 DIAGNOSIS — Z993 Dependence on wheelchair: Secondary | ICD-10-CM

## 2015-10-11 DIAGNOSIS — E2839 Other primary ovarian failure: Secondary | ICD-10-CM

## 2015-10-11 DIAGNOSIS — Z0000 Encounter for general adult medical examination without abnormal findings: Secondary | ICD-10-CM

## 2015-10-11 DIAGNOSIS — R269 Unspecified abnormalities of gait and mobility: Secondary | ICD-10-CM

## 2015-10-11 DIAGNOSIS — I10 Essential (primary) hypertension: Secondary | ICD-10-CM

## 2015-10-11 DIAGNOSIS — Z79899 Other long term (current) drug therapy: Secondary | ICD-10-CM

## 2015-10-11 MED ORDER — AMLODIPINE BESYLATE 10 MG PO TABS: 10.0000 mg | ORAL_TABLET | Freq: Every day | ORAL | 3 refills | Status: DC

## 2015-10-11 NOTE — Assessment & Plan Note (Signed)
Patient reports adherence to Amlodipine 5 mg daily, Lisinopril 10 mg daily, and Lasix 40 mg daily. She checks her BP at home but forgot to bring her log today. She has been seeing systolic values in the [id number]. BP on arrival to clinic today is 168/64 with repeat value at end of visit of 154/68. SBP above goal. Will increase her Amlodipine and have her follow up in 2 weeks for recheck and to see if she is tolerating the higher dose. -Increase Amlodpine to 10 mg daily -Continue Lisinopril 10 mg daily -Continue Lasix 40 mg daily -Continue checking BP at home -Continue diet modifications

## 2015-10-11 NOTE — Assessment & Plan Note (Addendum)
SCr stable at 1.56 on 03/23/15 and GFR > 30. Last Vitamin D level was low normal in March 2016. She takes Calcium + D daily. Will check labs today to evaluate for CKD-MBD in anticipation of bone density scan to evaluate for osteoporosis.  -Check BMP, Phos, and Vitamin D level

## 2015-10-11 NOTE — Progress Notes (Signed)
CC: HTN  HPI:  [name] is a 70 y.o. female with PMH as listed below who presents for follow up management of her HTN, dCHF, and discussion for a new powered wheelchair. Please see problem based charting for status of patient's chronic medical issues.  HTN Patient reports adherence to Amlodipine 5 mg daily, Lisinopril 10 mg daily, and Lasix 40 mg daily. She checks her BP at home but forgot to bring her log today. She has been seeing systolic values in the Q000111Q. BP on arrival to clinic today is 168/64 with repeat value at end of visit of 154/68.  dCHF Patient reports adherence to Lasix 40 mg (half 80 mg tab) daily. She wears compression stockings daily, elevates her legs, and monitors fluid and salt intake. She checks daily weights every morning reports consistent numbers. Wt today is 152.1 lbs. She denies any chest pain, dyspnea, pedal edema, or new limitations in activity.  Mobility/osteoarthritis Patient with longstanding limited ROM and strength of lower extremities secondary to osteoarthritis. She has been using a powered wheelchair since at least 2011. She is able to ambulate around the house with aid of a walker to some of her daily activities. She has been unable to fully extend her legs for a number of years due to arthritis in her knees. She was told that surgery was not an option for her in the past. She is requesting we start the process to help her obtain a new or refurbished powered wheelchair.  CKD 3 SCr stable at 1.56 on 03/23/15.   Health maintenance Patient agreeable to flu shot today. A bone density scan was ordered on last visit to evaluate for osteoporosis, however this was not completed. She does have limited mobility requiring use of powered wheelchair or walker to ambulate. She reports that she is careful not to ambulate without assistance to avoid falls and denies any recent falls.  Past Medical History:  Diagnosis Date  . Anemia   . Aortic stenosis   .  Arthritis   . Bilateral lower extremity edema   . Bradycardia    a. 01/2013 - asymptomatic.  [name] Chronic diastolic heart failure ([place])   . CKD (chronic kidney disease)    a. baseline CKD stage III  . Coronary artery disease   . GERD (gastroesophageal reflux disease)   . Headache(784.0)   . Heart murmur   . Hyperlipidemia   . Hypertension   . Mobitz (type) I (Wenckebach's) atrioventricular block    a. 01/2013 - asymptomatic.  . Morbid obesity ([place])   . Seizure disorder ([place])   . Seizures ([place])   . TIA (transient ischemic attack) 2014   pt stated she had "mini strokes"    Review of Systems:   Review of Systems  Eyes:       No change in vision  Respiratory: Negative for cough, shortness of breath and wheezing.   Cardiovascular: Negative for chest pain, orthopnea, leg swelling and PND.  Musculoskeletal: Negative for falls and myalgias.  Neurological: Negative for focal weakness, seizures and loss of consciousness.     Physical Exam:  Vitals:   10/11/15 1456 10/11/15 1608  BP: (!) 168/64 (!) 154/68  Pulse: (!) 52 (!) 43  Temp: 98 F (36.7 C)   TempSrc: Oral   SpO2: 100%   Weight: 152 lb 1.6 oz (69 kg)   Height: 5\' 1"  (1.549 m)    Physical Exam  Constitutional: She is oriented to person, place, and time.  Pleasant woman  resting in powered wheelchair  HENT:  Head: Normocephalic and atraumatic.  Cardiovascular: Regular rhythm.   Murmur heard. Systolic murmur throughout loudest at RUS border with radiation to carotids  Pulmonary/Chest: Effort normal. No respiratory distress. She has no wheezes. She has no rales.  Musculoskeletal: She exhibits no edema or tenderness.  Unable to fully extend bilateral lower extremities at the knees  Neurological: She is alert and oriented to person, place, and time.  Psychiatric: She has a normal mood and affect.    Assessment & Plan:   See Encounters Tab for problem based charting.  Patient discussed with Dr. Lynnae January

## 2015-10-11 NOTE — Assessment & Plan Note (Signed)
Agreeable for flu shot today. -Given flu shot

## 2015-10-11 NOTE — Assessment & Plan Note (Signed)
Patient reports adherence to Lasix 40 mg (half 80 mg tab) daily. She wears compression stockings daily, elevates her legs, and monitors fluid and salt intake. She checks daily weights every morning reports consistent numbers. Wt today is 152.1 lbs. She is not on a beta-blocker due to history of AV block. She denies any chest pain, dyspnea, pedal edema, or new limitations in activity. Weight is stable, no pedal edema or rales on lung exam. -Continue Lasix 40 mg daily -Continue to follow with Cardiology -Continue compression socks, elevation, and fluid restriction

## 2015-10-11 NOTE — Patient Instructions (Addendum)
It was good to see you again Sherry [name].  Your blood pressure is still a little elevated, so we will increase your Amlodipine to 10 mg daily from 5 mg. Please follow up in about 2 weeks to recheck your blood pressure on the new dose.  We will work on helping you obtain a new powered wheelchair.  Please continue your other medications as prescribed.  Please call for any questions or refill on your medications.

## 2015-10-11 NOTE — Assessment & Plan Note (Signed)
Patient with longstanding limited ROM and strength of lower extremities secondary to osteoarthritis. She has been using a powered wheelchair since at least 2011. She is able to ambulate around the house with aid of a walker to some of her daily activities. She has been unable to fully extend her legs for a number of years due to arthritis in her knees. She was told that surgery was not an option for her in the past. She is requesting we start the process to help her obtain a new or refurbished powered wheelchair. She is at high risk for fall given her limited mobility and does benefit from wheelchair use. -We will begin the process to help her obtain a new powered wheelchair

## 2015-10-11 NOTE — Assessment & Plan Note (Signed)
Patient postmenopausal and high risk for fall given limited mobility. No recent falls at home or known fragility fractures. -Obtain bone density scan to evaluate for osteoporosis and bisphosphonate therapy if indicated -Check BMP, Ph, Vitamin D to evaluate for CKD-MBD

## 2015-10-12 ENCOUNTER — Telehealth: Payer: Self-pay | Admitting: *Deleted

## 2015-10-12 LAB — BMP8+ANION GAP
ANION GAP: 20 mmol/L — AB (ref 10.0–18.0)
BUN/Creatinine Ratio: 21 (ref 12–28)
BUN: 27 mg/dL (ref 8–27)
CALCIUM: 9.2 mg/dL (ref 8.7–10.3)
CO2: 21 mmol/L (ref 18–29)
Chloride: 102 mmol/L (ref 96–106)
Creatinine, Ser: 1.29 mg/dL — ABNORMAL HIGH (ref 0.57–1.00)
GFR, EST AFRICAN AMERICAN: 49 mL/min/{1.73_m2} — AB (ref >59)
GFR, EST NON AFRICAN AMERICAN: 42 mL/min/{1.73_m2} — AB (ref >59)
Glucose: 74 mg/dL (ref 65–99)
Potassium: 4.3 mmol/L (ref 3.5–5.2)
Sodium: 143 mmol/L (ref 134–144)

## 2015-10-12 LAB — PHOSPHORUS: PHOSPHORUS: 4.1 mg/dL (ref 2.5–4.5)

## 2015-10-12 LAB — VITAMIN D 25 HYDROXY (VIT D DEFICIENCY, FRACTURES): Vit D, 25-Hydroxy: 25.9 ng/mL — ABNORMAL LOW (ref 30.0–100.0)

## 2015-10-13 NOTE — Progress Notes (Signed)
Internal Medicine Clinic Attending  Case discussed with Dr. Patel,Vishal at the time of the visit.  We reviewed the resident's history and exam and pertinent patient test results.  I agree with the assessment, diagnosis, and plan of care documented in the resident's note.  

## 2015-10-20 ENCOUNTER — Ambulatory Visit
Admission: RE | Admit: 2015-10-20 | Discharge: 2015-10-20 | Disposition: A | Discharge status: Home / Self Care | Payer: Medicare Other | Source: Ambulatory Visit | Attending: Internal Medicine | Admitting: Internal Medicine

## 2015-10-20 DIAGNOSIS — M810 Age-related osteoporosis without current pathological fracture: Secondary | ICD-10-CM | POA: Diagnosis not present

## 2015-10-20 DIAGNOSIS — E2839 Other primary ovarian failure: Secondary | ICD-10-CM

## 2015-10-20 DIAGNOSIS — Z780 Asymptomatic menopausal state: Secondary | ICD-10-CM | POA: Diagnosis not present

## 2015-10-25 ENCOUNTER — Encounter: Payer: Self-pay | Admitting: Internal Medicine

## 2015-10-25 ENCOUNTER — Ambulatory Visit (INDEPENDENT_AMBULATORY_CARE_PROVIDER_SITE_OTHER): Payer: Medicare Other | Admitting: Internal Medicine

## 2015-10-25 VITALS — BP 150/63 | HR 52 | Temp 97.8°F | Ht 61.0 in | Wt 152.6 lb

## 2015-10-25 DIAGNOSIS — M810 Age-related osteoporosis without current pathological fracture: Secondary | ICD-10-CM

## 2015-10-25 DIAGNOSIS — E2839 Other primary ovarian failure: Secondary | ICD-10-CM

## 2015-10-25 DIAGNOSIS — Z79899 Other long term (current) drug therapy: Secondary | ICD-10-CM

## 2015-10-25 DIAGNOSIS — I10 Essential (primary) hypertension: Secondary | ICD-10-CM | POA: Diagnosis not present

## 2015-10-25 MED ORDER — CALCIUM CARB-CHOLECALCIFEROL 500-400 MG-UNIT PO TABS: 1.0000 | ORAL_TABLET | Freq: Two times a day (BID) | ORAL | 2 refills | Status: DC

## 2015-10-25 MED ORDER — ALENDRONATE SODIUM 70 MG PO TABS: 70.0000 mg | ORAL_TABLET | ORAL | 2 refills | Status: DC

## 2015-10-25 MED ORDER — LISINOPRIL 20 MG PO TABS: 20.0000 mg | ORAL_TABLET | Freq: Every day | ORAL | 2 refills | Status: DC

## 2015-10-25 NOTE — Assessment & Plan Note (Signed)
BP Readings from Last 3 Encounters:  10/25/15 (!) 150/63  10/11/15 (!) 154/68  04/19/15 (!) 168/68   A: Last month her amlodipine was increased from 5 to 10mg  daily.  She was continued on lasix 40mg  daily and lisinopril 10mg  daily.  We are avoiding use of BB given her bradycardia and history of heart block.  Hydralazine and imdur also being avoided with her history of aortic stenosis.  Her renal function checked 2 weeks ago was stable and actually improved from 7 months prior.  She brought her home BP log today with daily readings averaging 130s - 150s.  She has not been experiencing any symptoms of hypotension such as lightheadedness or dizziness.  No blurred vision or headaches.  P: - continue amlodipine 10 mg daily - continue Lasix 40mg  daily.   She has no peripheral edema on exam and no complaints of orthopnea.  Her weight is also stable. - will increase her Lisinopril to 20mg  daily - RTC 2 weeks for BP check and BMET to ensure stability of her renal function on increased lisinopril dose

## 2015-10-25 NOTE — Progress Notes (Signed)
CC: here for HTN f/u  HPI:  [name] is a 70 y.o. woman with a past medical history listed below here today for follow up of her HTN.  For details of today's visit and the status of her chronic medical issues please refer to the assessment and plan.   Past Medical History:  Diagnosis Date  . Anemia   . Aortic stenosis   . Arthritis   . Bilateral lower extremity edema   . Bradycardia    a. 01/2013 - asymptomatic.  [name] Chronic diastolic heart failure ([place])   . CKD (chronic kidney disease)    a. baseline CKD stage III  . Coronary artery disease   . GERD (gastroesophageal reflux disease)   . Headache(784.0)   . Heart murmur   . Hyperlipidemia   . Hypertension   . Mobitz (type) I (Wenckebach's) atrioventricular block    a. 01/2013 - asymptomatic.  . Morbid obesity ([place])   . Seizure disorder ([place])   . Seizures ([place])   . TIA (transient ischemic attack) 2014   pt stated she had "mini strokes"    Review of Systems:   Please see pertinent ROS reviewed in HPI and problem based charting.   Physical Exam:  Vitals:   10/25/15 1024  BP: (!) 150/63  Pulse: (!) 52  Temp: 97.8 F (36.6 C)  TempSrc: Oral  SpO2: 99%  Weight: 152 lb 9.6 oz (69.2 kg)  Height: 5\' 1"  (1.549 m)   Physical Exam  Constitutional: She is oriented to person, place, and time.  Pleasant, AA woman, sitting in wheelchair, accompanied by husband.  HENT:  Head: Normocephalic and atraumatic.  Pulmonary/Chest: Effort normal.  Musculoskeletal: She exhibits no edema.  Wearing compression stockings.  Neurological: She is alert and oriented to person, place, and time.  Skin: Skin is warm and dry.  Psychiatric: Mood and affect normal.     Assessment & Plan:   See Encounters Tab for problem based charting.  Patient discussed with Dr. Dareen Piano.  Osteoporosis A: Patient with recent DEXA scan showed T-score of of -2.8 and considered osteoporotic.  Currently only on calcium and vitamin D  supplementation.  No recent falls at home and no history of fragility fracture.  P: - I have started her on Fosamax 70mg  weekly.  No dose adjustment needed given her degree of CKD. - I have increased her calcium and vitamin D3 to 500mg -400mg  BID from once daily.  Vitamin D level last month was less than 30 reflecting insufficiency.  Essential hypertension BP Readings from Last 3 Encounters:  10/25/15 (!) 150/63  10/11/15 (!) 154/68  04/19/15 (!) 168/68   A: Last month her amlodipine was increased from 5 to 10mg  daily.  She was continued on lasix 40mg  daily and lisinopril 10mg  daily.  We are avoiding use of BB given her bradycardia and history of heart block.  Hydralazine and imdur also being avoided with her history of aortic stenosis.  Her renal function checked 2 weeks ago was stable and actually improved from 7 months prior.  She brought her home BP log today with daily readings averaging 130s - 150s.  She has not been experiencing any symptoms of hypotension such as lightheadedness or dizziness.  No blurred vision or headaches.  P: - continue amlodipine 10 mg daily - continue Lasix 40mg  daily.   She has no peripheral edema on exam and no complaints of orthopnea.  Her weight is also stable. - will increase her Lisinopril to 20mg  daily -  RTC 2 weeks for BP check and BMET to ensure stability of her renal function on increased lisinopril dose

## 2015-10-25 NOTE — Patient Instructions (Signed)
Thank you for coming to see me today. It was a pleasure. Today we talked about:   Blood pressure: Continue your medications as previously prescribed.  We have made one change and increased your lisinopril to 20mg  daily.    Osteoporosis: your recent bone scan indicates you have osteoporosis.  I have sent in a new prescription to your pharmacy called Fosamax to decrease your fracture risk.  I have also increased your calcium and vitamin D to twice per day.  Please follow-up with [place] in 2 weeks.  If you have any questions or concerns, please do not hesitate to call the office at [phone].  Take Care,   Jule Ser, DO

## 2015-10-25 NOTE — Assessment & Plan Note (Addendum)
A: Patient with recent DEXA scan showed T-score of of -2.8 and considered osteoporotic.  Currently only on calcium and vitamin D supplementation.  No recent falls at home and no history of fragility fracture.  P: - I have started her on Fosamax 70mg  weekly.  No dose adjustment needed given her degree of CKD. - I have increased her calcium and vitamin D3 to 500mg -400mg  BID from once daily.  Vitamin D level last month was less than 30 reflecting insufficiency.

## 2015-10-26 NOTE — Progress Notes (Signed)
Internal Medicine Clinic Attending  Case discussed with Dr. Wallace at the time of the visit.  We reviewed the resident's history and exam and pertinent patient test results.  I agree with the assessment, diagnosis, and plan of care documented in the resident's note.  

## 2015-11-07 ENCOUNTER — Telehealth: Payer: Self-pay | Admitting: Internal Medicine

## 2015-11-07 NOTE — Telephone Encounter (Signed)
APT. REMINDER CALL, LMTCB °

## 2015-11-08 ENCOUNTER — Encounter: Payer: Self-pay | Admitting: Internal Medicine

## 2015-11-08 ENCOUNTER — Ambulatory Visit (INDEPENDENT_AMBULATORY_CARE_PROVIDER_SITE_OTHER): Payer: Medicare Other | Admitting: Internal Medicine

## 2015-11-08 VITALS — BP 137/68 | HR 48 | Temp 97.8°F | Ht 61.0 in | Wt 156.2 lb

## 2015-11-08 DIAGNOSIS — Z87891 Personal history of nicotine dependence: Secondary | ICD-10-CM

## 2015-11-08 DIAGNOSIS — Z79899 Other long term (current) drug therapy: Secondary | ICD-10-CM | POA: Diagnosis not present

## 2015-11-08 DIAGNOSIS — I10 Essential (primary) hypertension: Secondary | ICD-10-CM

## 2015-11-08 MED ORDER — FUROSEMIDE 80 MG PO TABS: 40.0000 mg | ORAL_TABLET | Freq: Every day | ORAL | 1 refills | Status: DC

## 2015-11-08 NOTE — Assessment & Plan Note (Signed)
BP Readings from Last 3 Encounters:  11/08/15 137/68  10/25/15 (!) 150/63  10/11/15 (!) 154/68   A:  Patient here for f/u of her BP.  Last visit, we continued her amlodipine and lasix.  We increased her lisinopril from 10 to 20mg  daily. She has tolerated this well.  No symptoms of low BP such as lightheadedness, dizziness, or syncope.  No symptoms of high BP such as HA or blurry vision.  Her only complaint today was some knee pain that got better with heating pad and getting up to walk.  This was 3 days ago.  BP on presentation to clinic was 163/66 and then 137/68 on repeat.  Her home BP log shows daily measurements with readings mostly in the 120-130 range.  There are 2 readings above 140.  Her weight is stable and she is not having worsening edema.  P:  - continue current meds: lisinopril 20mg  daily, amlodipine 10mg  daily, and lasix 40mg  daily - BMET today with increased lisinopril dose - RTC in 2-3 months for f/u with PCP.

## 2015-11-08 NOTE — Progress Notes (Signed)
   CC: here for f/u of HTN  HPI:  [name] is a 70 y.o. woman with a past medical history listed below here today for follow up of her HTN.  For details of today's visit and the status of her chronic medical issues please refer to the assessment and plan.   Past Medical History:  Diagnosis Date  . Anemia   . Aortic stenosis   . Arthritis   . Bilateral lower extremity edema   . Bradycardia    a. 01/2013 - asymptomatic.  [name] Chronic diastolic heart failure ([place])   . CKD (chronic kidney disease)    a. baseline CKD stage III  . Coronary artery disease   . GERD (gastroesophageal reflux disease)   . Headache(784.0)   . Heart murmur   . Hyperlipidemia   . Hypertension   . Mobitz (type) I (Wenckebach's) atrioventricular block    a. 01/2013 - asymptomatic.  . Morbid obesity ([place])   . Seizure disorder ([place])   . Seizures ([place])   . TIA (transient ischemic attack) 2014   pt stated she had "mini strokes"    Review of Systems:   Please see pertinent ROS reviewed in HPI and problem based charting.   Physical Exam:  Vitals:   11/08/15 1044 11/08/15 1116  BP: (!) 163/66 137/68  Pulse: 60 (!) 48  Temp: 97.8 F (36.6 C)   TempSrc: Oral   SpO2: 100%   Weight: 156 lb 3.2 oz (70.9 kg)   Height: 5\' 1"  (1.549 m)    Physical Exam  Constitutional: She is oriented to person, place, and time and well-developed, well-nourished, and in no distress.  Very pleasant, AA lady, sitting in wheelchair.   HENT:  Head: Normocephalic and atraumatic.  Cardiovascular: Normal rate and regular rhythm.   Murmur heard. 3/6 systolic murmur best heard over RUSB  Pulmonary/Chest: Effort normal and breath sounds normal. She has no wheezes. She has no rales.  Musculoskeletal: She exhibits no edema.  Neurological: She is alert and oriented to person, place, and time.     Assessment & Plan:   See Encounters Tab for problem based charting.  Patient discussed with Dr. Angelia Mould.  Essential  hypertension BP Readings from Last 3 Encounters:  11/08/15 137/68  10/25/15 (!) 150/63  10/11/15 (!) 154/68   A:  Patient here for f/u of her BP.  Last visit, we continued her amlodipine and lasix.  We increased her lisinopril from 10 to 20mg  daily. She has tolerated this well.  No symptoms of low BP such as lightheadedness, dizziness, or syncope.  No symptoms of high BP such as HA or blurry vision.  Her only complaint today was some knee pain that got better with heating pad and getting up to walk.  This was 3 days ago.  BP on presentation to clinic was 163/66 and then 137/68 on repeat.  Her home BP log shows daily measurements with readings mostly in the 120-130 range.  There are 2 readings above 140.  Her weight is stable and she is not having worsening edema.  P:  - continue current meds: lisinopril 20mg  daily, amlodipine 10mg  daily, and lasix 40mg  daily - BMET today with increased lisinopril dose - RTC in 2-3 months for f/u with PCP.

## 2015-11-08 NOTE — Progress Notes (Signed)
Internal Medicine Clinic Attending  Case discussed with Dr. Wallace at the time of the visit.  We reviewed the resident's history and exam and pertinent patient test results.  I agree with the assessment, diagnosis, and plan of care documented in the resident's note.  

## 2015-11-08 NOTE — Patient Instructions (Addendum)
Thank you for coming to see me today. It was a pleasure. Today we talked about:  We have not made any changes to your medication today.  Continue with your current blood pressure medications.   I have refilled your Lasix medication.  We will check some blood work today and let you know if anything is abnormal.  Please follow-up with Dr. Posey Pronto in 1-3 months.  If you have any questions or concerns, please do not hesitate to call the office at [phone].  Take Care,   Jule Ser, DO

## 2015-11-09 LAB — BMP8+ANION GAP
Anion Gap: 17 mmol/L (ref 10.0–18.0)
BUN/Creatinine Ratio: 21 (ref 12–28)
BUN: 24 mg/dL (ref 8–27)
CO2: 20 mmol/L (ref 18–29)
Calcium: 8.1 mg/dL — ABNORMAL LOW (ref 8.7–10.3)
Chloride: 105 mmol/L (ref 96–106)
Creatinine, Ser: 1.17 mg/dL — ABNORMAL HIGH (ref 0.57–1.00)
GFR calc Af Amer: 55 mL/min/1.73 — ABNORMAL LOW
GFR calc non Af Amer: 48 mL/min/1.73 — ABNORMAL LOW
Glucose: 81 mg/dL (ref 65–99)
Potassium: 4 mmol/L (ref 3.5–5.2)
Sodium: 142 mmol/L (ref 134–144)

## 2016-01-24 ENCOUNTER — Other Ambulatory Visit: Payer: Self-pay | Admitting: Internal Medicine

## 2016-01-24 DIAGNOSIS — M810 Age-related osteoporosis without current pathological fracture: Secondary | ICD-10-CM

## 2016-02-05 ENCOUNTER — Ambulatory Visit (INDEPENDENT_AMBULATORY_CARE_PROVIDER_SITE_OTHER): Payer: Medicare Other | Admitting: Internal Medicine

## 2016-02-05 ENCOUNTER — Encounter: Payer: Self-pay | Admitting: Internal Medicine

## 2016-02-05 VITALS — BP 203/79 | HR 68 | Temp 97.4°F | Ht 61.0 in | Wt 151.4 lb

## 2016-02-05 DIAGNOSIS — M810 Age-related osteoporosis without current pathological fracture: Secondary | ICD-10-CM

## 2016-02-05 DIAGNOSIS — Z993 Dependence on wheelchair: Secondary | ICD-10-CM

## 2016-02-05 DIAGNOSIS — Z87891 Personal history of nicotine dependence: Secondary | ICD-10-CM

## 2016-02-05 DIAGNOSIS — Z9181 History of falling: Secondary | ICD-10-CM

## 2016-02-05 DIAGNOSIS — I110 Hypertensive heart disease with heart failure: Secondary | ICD-10-CM | POA: Diagnosis not present

## 2016-02-05 DIAGNOSIS — R2689 Other abnormalities of gait and mobility: Secondary | ICD-10-CM

## 2016-02-05 DIAGNOSIS — Z0000 Encounter for general adult medical examination without abnormal findings: Secondary | ICD-10-CM

## 2016-02-05 DIAGNOSIS — Z79899 Other long term (current) drug therapy: Secondary | ICD-10-CM

## 2016-02-05 DIAGNOSIS — I10 Essential (primary) hypertension: Secondary | ICD-10-CM

## 2016-02-05 DIAGNOSIS — M170 Bilateral primary osteoarthritis of knee: Secondary | ICD-10-CM | POA: Diagnosis not present

## 2016-02-05 DIAGNOSIS — R269 Unspecified abnormalities of gait and mobility: Secondary | ICD-10-CM

## 2016-02-05 DIAGNOSIS — I5032 Chronic diastolic (congestive) heart failure: Secondary | ICD-10-CM | POA: Diagnosis not present

## 2016-02-05 MED ORDER — FUROSEMIDE 40 MG PO TABS: 40.0000 mg | ORAL_TABLET | Freq: Every day | ORAL | 5 refills | Status: DC

## 2016-02-05 NOTE — Progress Notes (Signed)
CC: HTN  HPI:  [name] is a 70 y.o. female with PMH as listed below who presents for follow up management of her HTN.  HTN Patient reports adherence to Amlodipine 10 mg daily, Lisinopril 20 mg daily. She takes Lasix 40 mg daily, but ran out about 3 weeks ago. She keeps a log of her home BPs which mostly run between 656-812X systolic. BP on arrival to clinic today is elevated at 203/79. She denies any chest pain, palpitations, headache, SOB, change in vision.  dCHF Patient reports running out of Lasix 40 mg (half 80 mg tab) daily about 3 weeks ago. She checks her weights daily, usually 149-150 lbs per review of her home log. She wears compression stockings daily, elevates her legs, and monitors fluid and salt intake. Wt today is 151 lbs. She denies any lower extremity edema or shortness of breath.  Osteoporosis: Patient started on Fosamax 70 mg weekly in September 2017 after DEXA scan showed T-score of -2.8 at the right femur neck. She is tolerating Fosamax well, taking it every Friday with a glass of water and sitting upright.  Mobility/Osteoarthritis: Patient with longstanding limited ROM and strength of lower extremities secondary to osteoarthritis. She has been using a powered wheelchair since at least 2011. She is able to ambulate around the house with aid of a walker to complete  some of her daily activities. She has been unable to fully extend her legs for a number of years due to arthritis in her knees. She was told that surgery was not an option for her in the past. She is requesting assistance to help her obtain a new or refurbished powered wheelchair as her current one is having issues with controlling direction changes.  Healthcare Maintenance: Patient last had a colonoscopy on 11/26/2006 which showed small internal and external hemorrhoids without polyps.  Past Medical History:  Diagnosis Date  . Anemia   . Aortic stenosis   . Arthritis   . Bilateral lower  extremity edema   . Bradycardia    a. 01/2013 - asymptomatic.  [name] Chronic diastolic heart failure ([name])   . CKD (chronic kidney disease)    a. baseline CKD stage III  . Coronary artery disease   . GERD (gastroesophageal reflux disease)   . Headache(784.0)   . Heart murmur   . Hyperlipidemia   . Hypertension   . Mobitz (type) I (Wenckebach's) atrioventricular block    a. 01/2013 - asymptomatic.  . Morbid obesity ([name])   . Seizure disorder ([name])   . Seizures ([name])   . TIA (transient ischemic attack) 2014   pt stated she had "mini strokes"    Review of Systems:   Review of Systems  Constitutional: Negative for diaphoresis.  Eyes:       No change in vision  Respiratory: Negative for shortness of breath and wheezing.   Cardiovascular: Negative for chest pain, palpitations, orthopnea, leg swelling and PND.  Gastrointestinal: Negative for blood in stool and melena.  Musculoskeletal: Negative for falls.       Decreased ROM at knees  Neurological: Negative for dizziness, loss of consciousness and headaches.     Physical Exam:  Vitals:   02/05/16 1330  BP: (!) 203/79  Pulse: 68  Temp: 97.4 F (36.3 C)  TempSrc: Oral  SpO2: 100%  Weight: 151 lb 6.4 oz (68.7 kg)  Height: 5\' 1"  (1.549 m)   Physical Exam  Constitutional: She is oriented to person, place, and time. No distress.  Cardiovascular: Normal rate and regular rhythm.   Murmur heard. 3/6 systolic murmur loudest at RUS border with radiation to carotids  Pulmonary/Chest: Effort normal. No respiratory distress. She has no wheezes. She has no rales.  Musculoskeletal: She exhibits no edema or tenderness.  Neurological: She is alert and oriented to person, place, and time.  Skin: Skin is warm. She is not diaphoretic.    Assessment & Plan:   See Encounters Tab for problem based charting.  Patient discussed with Dr. Daryll Drown

## 2016-02-05 NOTE — Patient Instructions (Addendum)
It was a pleasure to see you again Sherry [name].  Your blood pressure was high today at 203/79. We rechecked it and it was 160/80. Please continue to check your blood pressure at home and bring the log with you on follow up.  Please continue your medications as prescribed. I have refilled your Lasix (furosemide) 40 mg daily.  I have spoken to our staff to check if you are eligible for a new power wheelchair.  Please follow up with [place] in about 2 weeks to recheck your blood pressure.

## 2016-02-06 NOTE — Assessment & Plan Note (Signed)
Patient started on Fosamax 70 mg weekly in September 2017 after DEXA scan showed T-score of -2.8 at the right femur neck. She is tolerating Fosamax well, taking it every Friday with a glass of water and sitting upright. -Continue Fosamax 70 mg weekly

## 2016-02-06 NOTE — Assessment & Plan Note (Signed)
Patient reports running out of Lasix 40 mg (half 80 mg tab) daily about 3 weeks ago. She checks her weights daily, usually 149-150 lbs per review of her home log. She wears compression stockings daily, elevates her legs, and monitors fluid and salt intake. Wt today is 151 lbs. She denies any lower extremity edema or shortness of breath.  CHF is stable despite running out of Lasix. Will refill Lasix and continue current medications. She will continue to use compression socks, elevate legs, check daily weights, and monitor diet for salt and fluid restrictions.

## 2016-02-06 NOTE — Assessment & Plan Note (Signed)
Patient with longstanding limited ROM and strength of lower extremities secondary to osteoarthritis. She has been using a powered wheelchair since at least 2011. She is able to ambulate around the house with aid of a walker to complete  some of her daily activities. She has been unable to fully extend her legs for a number of years due to arthritis in her knees. She was told that surgery was not an option for her in the past. She is requesting assistance to help her obtain a new or refurbished powered wheelchair as her current one is having issues with controlling direction changes. She does benefit from wheelchair use given her fall risk. -Discussed with our staff, we will look into if she is eligible for a new or refurbished power wheelchair

## 2016-02-06 NOTE — Assessment & Plan Note (Signed)
Patient last had a colonoscopy on 11/26/2006 which showed small internal and external hemorrhoids without polyps.  Discussed repeat screening colonoscopy with patient, she prefers to wait until follow up to decide on this. Will discuss again at that time.

## 2016-02-06 NOTE — Assessment & Plan Note (Signed)
BP Readings from Last 3 Encounters:  02/05/16 (!) 203/79  11/08/15 137/68  10/25/15 (!) 150/63    Patient reports adherence to Amlodipine 10 mg daily, Lisinopril 20 mg daily. She takes Lasix 40 mg daily, but ran out about 3 weeks ago. She keeps a log of her home BPs which mostly run between 034-035C systolic. BP on arrival to clinic today is elevated at 203/79. She denies any chest pain, palpitations, headache, SOB, change in vision.  Repeat BP is 160/80. She is currently asymptomatic. Will refill patient's Lasix and continue current medications. She will follow up in 2 weeks for recheck of her blood pressure and bring her BP log back for review. -Continue Amlodipine 10 mg daily -Continue Lisinopril 20 mg daily -Continue Lasix 40 mg daily -f/u in 2 weeks for BP check

## 2016-02-07 ENCOUNTER — Encounter: Payer: Medicare Other | Admitting: Internal Medicine

## 2016-02-09 NOTE — Progress Notes (Signed)
Internal Medicine Clinic Attending  Case discussed with Dr. Patel,Vishal soon after the resident saw the patient.  We reviewed the resident's history and exam and pertinent patient test results.  I agree with the assessment, diagnosis, and plan of care documented in the resident's note. 

## 2016-02-20 ENCOUNTER — Ambulatory Visit (INDEPENDENT_AMBULATORY_CARE_PROVIDER_SITE_OTHER): Payer: Medicare Other | Admitting: Internal Medicine

## 2016-02-20 DIAGNOSIS — Z79899 Other long term (current) drug therapy: Secondary | ICD-10-CM | POA: Diagnosis not present

## 2016-02-20 DIAGNOSIS — I10 Essential (primary) hypertension: Secondary | ICD-10-CM | POA: Diagnosis not present

## 2016-02-20 DIAGNOSIS — Z87891 Personal history of nicotine dependence: Secondary | ICD-10-CM | POA: Diagnosis not present

## 2016-02-20 MED ORDER — LISINOPRIL 40 MG PO TABS: 40.0000 mg | ORAL_TABLET | Freq: Every day | ORAL | 1 refills | Status: DC

## 2016-02-20 NOTE — Patient Instructions (Signed)
Sherry [name],   It was a pleasure meeting you today. Your blood pressure is still a little higher than I would like it to be. Our goal is to get you less than 140/90 and you have been in the upper 140s-150s.  Continue taking the amlodipine 10 mg daily and the Lasix 40 mg daily. I am going to increase the dose of your lisinopril 20 mg to 40 mg daily. You can take 2 pills of the 20 mg pills you have at home starting tomorrow. Your new prescription at the pharmacy will be 40 mg dose and once you have that filled go back to taking 1 pill daily.   I would like you to follow up in 2 weeks to re-check your blood pressure.

## 2016-02-20 NOTE — Assessment & Plan Note (Signed)
BP Readings from Last 3 Encounters:  02/20/16 (!) 148/70  02/05/16 (!) 203/79  11/08/15 137/68    Lab Results  Component Value Date   NA 142 11/08/2015   K 4.0 11/08/2015   CREATININE 1.17 (H) 11/08/2015    Assessment: BP 152/64. Re-check 148/70. Patient reports adherence to Amlodipine 10 mg daily, Lisinopril 20 mg daily as well as the Lasix 40 mg daily since getting it refilled at last visit. She reports she checks her BP at home which she says mostly run between 432-761Y systolic. BP on arrival to clinic today is elevated at 152/64. It is 148/70 on re-check. She denies any chest pain, palpitations, headache, SOB, change in vision, nausea/vomiting.   Plan: Will increase Lisinopril to 40 mg daily today. Continue Amlodipine 10 mg daily and Lasix 40 mg daily. Re-check BMET at next visit Follow up in 2 weeks

## 2016-02-20 NOTE — Progress Notes (Signed)
8  CC: HTN re-check  HPI:  [name] is a 71 y.o. female with a past medical history listed below here today for follow up of her HTN.  HTN - BP 152/64. Re-check 148/70. Patient reports adherence to Amlodipine 10 mg daily, Lisinopril 20 mg daily as well as the Lasix 40 mg daily since getting it refilled at last visit. She reports she checks her BP at home which she says mostly run between 740-814G systolic. BP on arrival to clinic today is elevated at 152/64. It is 148/70 on re-check. She denies any chest pain, palpitations, headache, SOB, change in vision, nausea/vomiting.   Past Medical History:  Diagnosis Date  . Anemia   . Aortic stenosis   . Arthritis   . Bilateral lower extremity edema   . Bradycardia    a. 01/2013 - asymptomatic.  [name] Chronic diastolic heart failure ([place])   . CKD (chronic kidney disease)    a. baseline CKD stage III  . Coronary artery disease   . GERD (gastroesophageal reflux disease)   . Headache(784.0)   . Heart murmur   . Hyperlipidemia   . Hypertension   . Mobitz (type) I (Wenckebach's) atrioventricular block    a. 01/2013 - asymptomatic.  . Morbid obesity ([place])   . Seizure disorder ([place])   . Seizures ([place])   . TIA (transient ischemic attack) 2014   pt stated she had "mini strokes"    Review of Systems:   Review of Systems  Eyes: Negative for blurred vision and double vision.  Respiratory: Negative for shortness of breath.   Cardiovascular: Negative for chest pain and palpitations.  Gastrointestinal: Negative for abdominal pain, nausea and vomiting.  Neurological: Negative for dizziness and headaches.   Physical Exam:  Vitals:   02/20/16 1051 02/20/16 1130  BP: (!) 152/64 (!) 148/70  Pulse: (!) 53 (!) 44  Temp: 97.9 F (36.6 C)   TempSrc: Oral   SpO2: 100%   Weight: 151 lb 6.4 oz (68.7 kg)   Height: 5\' 1"  (1.549 m)    Physical Exam  Constitutional: She is well-developed, well-nourished, and in no distress.  HENT:  Head:  Normocephalic and atraumatic.  Eyes: EOM are normal. Pupils are equal, round, and reactive to light.  Cardiovascular: Normal rate and regular rhythm.   Murmur heard. 3/6 systolic murmur loudest at RUS border with radiation to carotids   Pulmonary/Chest: Effort normal and breath sounds normal.    Assessment & Plan:   See Encounters Tab for problem based charting.  Patient discussed with Dr. Lynnae January

## 2016-02-23 NOTE — Progress Notes (Signed)
Internal Medicine Clinic Attending  Case discussed with Dr. Boswell soon after the resident saw the patient.  We reviewed the resident's history and exam and pertinent patient test results.  I agree with the assessment, diagnosis, and plan of care documented in the resident's note. 

## 2016-02-27 ENCOUNTER — Other Ambulatory Visit: Payer: Self-pay

## 2016-02-27 ENCOUNTER — Ambulatory Visit (HOSPITAL_COMMUNITY): Payer: Medicare Other | Attending: Internal Medicine

## 2016-02-27 DIAGNOSIS — I2510 Atherosclerotic heart disease of native coronary artery without angina pectoris: Secondary | ICD-10-CM | POA: Diagnosis not present

## 2016-02-27 DIAGNOSIS — I350 Nonrheumatic aortic (valve) stenosis: Secondary | ICD-10-CM | POA: Insufficient documentation

## 2016-02-27 DIAGNOSIS — I501 Left ventricular failure: Secondary | ICD-10-CM | POA: Insufficient documentation

## 2016-02-27 DIAGNOSIS — I313 Pericardial effusion (noninflammatory): Secondary | ICD-10-CM | POA: Diagnosis not present

## 2016-02-27 DIAGNOSIS — R9439 Abnormal result of other cardiovascular function study: Secondary | ICD-10-CM | POA: Insufficient documentation

## 2016-02-27 DIAGNOSIS — I361 Nonrheumatic tricuspid (valve) insufficiency: Secondary | ICD-10-CM | POA: Insufficient documentation

## 2016-03-01 ENCOUNTER — Telehealth: Payer: Self-pay | Admitting: *Deleted

## 2016-03-01 NOTE — Telephone Encounter (Signed)
Return pt's call - states her hands/feet are swollen. She's taking Lasix 40 mg but not going to bathroom. Weight this morning was 150lbs but states unsure if it is accurate. States her feet does look too swollen on top but bottom of her feet, has some trouble walking. Denies SOB. Offer appt today - but cannot come d/t she rides transportation. Has an appt already scheduled on the 17th. Told her I will inform her doctor but if swelling/weight increase,SOB - to go to the ED. States she will but "I will ok until my appointment next week".

## 2016-03-01 NOTE — Telephone Encounter (Signed)
Pt called - informed if she needs to be seen sooner/ change in condition to call and we will schedule her to be seen by someone else (in [facility]). Voiced understanding.

## 2016-03-01 NOTE — Telephone Encounter (Signed)
Agree with plan. Can also consider [facility] visit earlier if needed.

## 2016-03-04 ENCOUNTER — Other Ambulatory Visit: Payer: Self-pay | Admitting: Internal Medicine

## 2016-03-06 ENCOUNTER — Encounter: Payer: Medicare Other | Admitting: Internal Medicine

## 2016-03-08 NOTE — Telephone Encounter (Signed)
rx phoned into [email or web address].Despina Hidden Cassady1/19/20183:18 PM

## 2016-03-13 ENCOUNTER — Ambulatory Visit (INDEPENDENT_AMBULATORY_CARE_PROVIDER_SITE_OTHER): Payer: Medicare Other | Admitting: Internal Medicine

## 2016-03-13 VITALS — BP 156/68 | HR 59 | Ht 61.0 in | Wt 151.0 lb

## 2016-03-13 DIAGNOSIS — I350 Nonrheumatic aortic (valve) stenosis: Secondary | ICD-10-CM

## 2016-03-13 DIAGNOSIS — I10 Essential (primary) hypertension: Secondary | ICD-10-CM | POA: Diagnosis not present

## 2016-03-13 DIAGNOSIS — I130 Hypertensive heart and chronic kidney disease with heart failure and stage 1 through stage 4 chronic kidney disease, or unspecified chronic kidney disease: Secondary | ICD-10-CM

## 2016-03-13 DIAGNOSIS — I129 Hypertensive chronic kidney disease with stage 1 through stage 4 chronic kidney disease, or unspecified chronic kidney disease: Secondary | ICD-10-CM

## 2016-03-13 DIAGNOSIS — G40909 Epilepsy, unspecified, not intractable, without status epilepticus: Secondary | ICD-10-CM

## 2016-03-13 DIAGNOSIS — Z79899 Other long term (current) drug therapy: Secondary | ICD-10-CM

## 2016-03-13 DIAGNOSIS — N183 Chronic kidney disease, stage 3 unspecified: Secondary | ICD-10-CM

## 2016-03-13 DIAGNOSIS — I5032 Chronic diastolic (congestive) heart failure: Secondary | ICD-10-CM

## 2016-03-13 DIAGNOSIS — M170 Bilateral primary osteoarthritis of knee: Secondary | ICD-10-CM

## 2016-03-13 DIAGNOSIS — Z5181 Encounter for therapeutic drug level monitoring: Secondary | ICD-10-CM

## 2016-03-13 DIAGNOSIS — Z87891 Personal history of nicotine dependence: Secondary | ICD-10-CM

## 2016-03-13 NOTE — Patient Instructions (Addendum)
It was a pleasure to see you again Sherry [name].  Please call us if you have return of swelling in your feet, legs, or hands or if you have trouble with urination again.  I will refer you to a local neurologist for your history of seizures.  We are checking blood work today for your kidneys and phenobarbital level.  Please let us know if you need refills on your medications.

## 2016-03-13 NOTE — Progress Notes (Signed)
CC: CHF  HPI:  [name] is a 71 y.o. female with PMH as listed below who presents for follow up management of her diastolic CHF, HTN, and epilepsy.  Diastolic CHF: Patient reports that about 2 weeks ago she experienced swelling in her hands and legs that coincided with decreased urine output. She did not miss any doses of her Lasix 40 mg daily or report any dietary indiscretion. Her symptoms resolved on their own and she continued using her compression stockings and elevating her feet at night. She did not have any associated shortness of breath, increased dyspnea on exertion, or increased orthopnea (usually sleeps with 5-6 pillows beneath her head at night). Her weight is unchanged at 151 lbs. She feels back to her baseline today with good urine output. Repeat TTE on 02/27/2016 showed an EF of 85% with severe concentric hypertrophy.  Aortic Stenosis: Patient with moderate-severe aortic stenosis. Repeat TTE on 02/27/2016 showed a mean AS gradient of 26 mmHg, peak 50 mmHg, valve area 1.1 cm^2. She currently denies any lightheadedness/dizziness or syncopal episodes. Physical mobility is limited due to bilateral knee OA. She follows with Cardiology (Dr. Burt Knack) and CT Surgery (Dr. Roxy Manns) with follow ups scheduled.  HTN: Currently takes Amlodipine 10 mg daily, Lisinopril 40 mg daily (increased from 20 mg daily three weeks ago), and Lasix 40 mg daily. BP this visit is 156/68.  Epilepsy: Patient currently takes Phenobarbital 129.6 mg daily and Zonisamide 300 mg qhs for her history of seizures. She has previously followed with neurology in [facility], but has not been back due to difficulty with travelling. She has not had a seizure in years.  CKD Stage 3: Baseline creatinine is between 1.2 - 1.6. She does report recent decreased urine output as noted above which has now resolved.   Past Medical History:  Diagnosis Date  . Anemia   . Aortic stenosis   . Arthritis   . Bilateral lower  extremity edema   . Bradycardia    a. 01/2013 - asymptomatic.  [name] Chronic diastolic heart failure ([place])   . CKD (chronic kidney disease)    a. baseline CKD stage III  . Coronary artery disease   . GERD (gastroesophageal reflux disease)   . Headache(784.0)   . Heart murmur   . Hyperlipidemia   . Hypertension   . Mobitz (type) I (Wenckebach's) atrioventricular block    a. 01/2013 - asymptomatic.  . Morbid obesity ([place])   . Seizure disorder ([place])   . Seizures ([place])   . TIA (transient ischemic attack) 2014   pt stated she had "mini strokes"    Review of Systems:   Review of Systems  Constitutional: Negative for chills and fever.  Respiratory: Negative for shortness of breath.   Cardiovascular: Negative for chest pain and palpitations.       Recent swelling of hands and feet, now improved  Musculoskeletal: Positive for joint pain.  Neurological: Negative for dizziness, seizures and loss of consciousness.     Physical Exam:  Vitals:   03/13/16 1456  BP: (!) 156/68  Pulse: (!) 59  SpO2: 100%  Weight: 151 lb (68.5 kg)  Height: 5\' 1"  (1.549 m)   Physical Exam  Constitutional: She is oriented to person, place, and time. No distress.  Pleasant woman, resting in a wheelchair.  Cardiovascular: Regular rhythm.   Borderline bradycardia, 3/6 systolic murmur loudest at the RUS border  Pulmonary/Chest: Effort normal. No respiratory distress. She has no wheezes. She has no rales.  Musculoskeletal: She exhibits no edema or tenderness.  Neurological: She is alert and oriented to person, place, and time.  Skin: Skin is warm. She is not diaphoretic.    Assessment & Plan:   See Encounters Tab for problem based charting.  Patient discussed with Dr. Beryle Beams

## 2016-03-14 LAB — BMP8+ANION GAP
ANION GAP: 17 mmol/L (ref 10.0–18.0)
BUN/Creatinine Ratio: 21 (ref 12–28)
BUN: 28 mg/dL — AB (ref 8–27)
CO2: 19 mmol/L (ref 18–29)
CREATININE: 1.32 mg/dL — AB (ref 0.57–1.00)
Calcium: 8.5 mg/dL — ABNORMAL LOW (ref 8.7–10.3)
Chloride: 104 mmol/L (ref 96–106)
GFR calc Af Amer: 47 mL/min/{1.73_m2} — ABNORMAL LOW (ref >59)
GFR, EST NON AFRICAN AMERICAN: 41 mL/min/{1.73_m2} — AB (ref >59)
Glucose: 77 mg/dL (ref 65–99)
Potassium: 4.2 mmol/L (ref 3.5–5.2)
SODIUM: 140 mmol/L (ref 134–144)

## 2016-03-14 LAB — PHENOBARBITAL LEVEL: Phenobarbital, Serum: 28 ug/mL (ref 15–40)

## 2016-03-15 NOTE — Assessment & Plan Note (Signed)
Baseline creatinine is between 1.2 - 1.6. She does report recent decreased urine output as noted above which has now resolved.  She reports good UOP now. Repeat BMET shows a creatinine of 1.32 at her baseline. Will continue to monitor.

## 2016-03-15 NOTE — Assessment & Plan Note (Signed)
Currently takes Amlodipine 10 mg daily, Lisinopril 40 mg daily (increased from 20 mg daily three weeks ago), and Lasix 40 mg daily. BP this visit is 156/68.  Repeat BMET this visit shows a creatinine of 1.32 at her baseline and K of 4.2. Given her mod-severe AS, it is unclear how aggressive we should be in regards to further reduction in BP as this may be counterproductive. Will continue her current medications as noted above and continue to monitor.

## 2016-03-15 NOTE — Assessment & Plan Note (Signed)
Patient reports that about 2 weeks ago she experienced swelling in her hands and legs that coincided with decreased urine output. She did not miss any doses of her Lasix 40 mg daily or report any dietary indiscretion. Her symptoms resolved on their own and she continued using her compression stockings and elevating her feet at night. She did not have any associated shortness of breath, increased dyspnea on exertion, or increased orthopnea (usually sleeps with 5-6 pillows beneath her head at night). Her weight is unchanged at 151 lbs. She feels back to her baseline today with good urine output. Repeat TTE on 02/27/2016 showed an EF of 85% with severe concentric hypertrophy.  Symptomatically, her CHF is currently stable with exception of the recent swelling in her hands and legs as noted above. This has now resolved and she does not have any edema in her hands or legs on exam. Her lungs are clear to auscultation. Will continue her current medications. She will continue to use compression socks, elevate legs, check daily weights, and monitor her diet for salt and fluid restrictions.

## 2016-03-15 NOTE — Assessment & Plan Note (Signed)
Patient currently takes Phenobarbital 129.6 mg daily and Zonisamide 300 mg qhs for her history of seizures. She has previously followed with neurology in [facility], but has not been back due to difficulty with travelling. She has not had a seizure in years.  Her epilepsy seems to be well-controlled on her current medications. Her phenobarbital level this visit is within therapeutic range at 28. Given her difficulty with travelling to [facility], I will refer her to local Neurology for further input and recommendations on management. Will continue current medications.

## 2016-03-15 NOTE — Assessment & Plan Note (Signed)
Patient with moderate-severe aortic stenosis. Repeat TTE on 02/27/2016 showed a mean AS gradient of 26 mmHg, peak 50 mmHg, valve area 1.1 cm^2. She currently denies any lightheadedness/dizziness or syncopal episodes. Physical mobility is limited due to bilateral knee OA. She follows with Cardiology (Dr. Burt Knack) and CT Surgery (Dr. Roxy Manns) with follow ups scheduled.  She is essentially asymptomatic although repeat TTE did show increased AS. Will continue current management and observation. She will follow up with Dr. Roxy Manns next month.

## 2016-03-18 NOTE — Progress Notes (Signed)
Medicine attending: Medical history, presenting problems, physical findings, and medications, reviewed with resident physician Dr Vishal Patel on the day of the patient visit and I concur with his evaluation and management plan. 

## 2016-03-20 ENCOUNTER — Other Ambulatory Visit: Payer: Self-pay | Admitting: Cardiology

## 2016-03-21 NOTE — Telephone Encounter (Signed)
Rx has been sent to the pharmacy electronically. ° °

## 2016-04-08 ENCOUNTER — Other Ambulatory Visit: Payer: Self-pay

## 2016-04-08 MED ORDER — ZONISAMIDE 100 MG PO CAPS: 300.0000 mg | ORAL_CAPSULE | Freq: Every day | ORAL | 1 refills | Status: DC

## 2016-04-08 NOTE — Telephone Encounter (Signed)
zonisamide (ZONEGRAN) 100 MG capsule, refill request @ [email or web address] on [address].

## 2016-04-15 ENCOUNTER — Ambulatory Visit (INDEPENDENT_AMBULATORY_CARE_PROVIDER_SITE_OTHER): Payer: Medicare Other | Admitting: Thoracic Surgery (Cardiothoracic Vascular Surgery)

## 2016-04-15 ENCOUNTER — Encounter: Payer: Self-pay | Admitting: Thoracic Surgery (Cardiothoracic Vascular Surgery)

## 2016-04-15 VITALS — BP 126/63 | Resp 16 | Ht 61.0 in | Wt 151.0 lb

## 2016-04-15 DIAGNOSIS — I5032 Chronic diastolic (congestive) heart failure: Secondary | ICD-10-CM | POA: Diagnosis not present

## 2016-04-15 DIAGNOSIS — I350 Nonrheumatic aortic (valve) stenosis: Secondary | ICD-10-CM | POA: Diagnosis not present

## 2016-04-15 NOTE — Progress Notes (Signed)
[address]       [place] [address]             [phone]     CARDIOTHORACIC SURGERY OFFICE NOTE  Referring Provider is Sherren Mocha, MD PCP is Zada Finders, MD   HPI:  Patient is a 71 year old African-American female with multiple medical problems who returns to the office today for follow-up of aortic stenosis. She originally presented with symptoms of dizziness and syncope associated with intermittent 21 AV block. She was felt to have Mobitz type I AV block and a decision was made to observe her without pacemaker placement. Her symptoms resolved following adjustments in her medical therapy. Transthoracic echocardiograms have documented the presence of normal left ventricular systolic function with severe left ventricular hypertrophy and moderate aortic stenosis.  I first had the opportunity to see her in consultation in March 2015. Since then she has followed up regularly with Dr. Burt Knack and she was last seen in our office on 04/17/2015. Since then she has remained stable from a cardiac standpoint. She follows up regularly with her primary care physician, Dr. Posey Pronto.  She was last seen in their office in January at which time she reported some increased swelling in her feet without any associated shortness of breath or dyspnea on exertion. She underwent a follow-up echocardiogram which revealed preserved left ventricular systolic function but severe left ventricular hypertrophy and significant progression in the severity of aortic stenosis. She returns to our office for routine follow-up today. She is accompanied by her husband and notes that she still does not have any symptoms of exertional shortness of breath or chest discomfort. Her activity level remains stable. Despite her chronic physical limitations she still does chores around the house. She has not had any dizzy spells or near syncopal episodes, although her husband states that she had one spell recently  where he spoke with her and she simply stared blankly into space and didn't answer for several seconds. This was not associated with any symptoms of dizziness nor did the patient slumped over in her chair. The patient denies any PND, orthopnea, tachycardia palpitations, or chest discomfort. She has not been having problems with swelling around her ankles over the last several weeks.   Current Outpatient Prescriptions  Medication Sig Dispense Refill  . alendronate (FOSAMAX) 70 MG tablet TAKE 1 TABLET BY MOUTH ONCE WEEKLY WITH A FULL GLASS OF WATER ON AN EMPTY STOMACH. 12 tablet 2  . amLODipine (NORVASC) 10 MG tablet Take 1 tablet (10 mg total) by mouth daily. 90 tablet 3  . aspirin 81 MG EC tablet Take 81 mg by mouth daily.     . Calcium Carb-Cholecalciferol (CALCIUM 500 +D) 500-400 MG-UNIT TABS Take 1 tablet by mouth 2 (two) times daily. 60 tablet 2  . furosemide (LASIX) 40 MG tablet Take 1 tablet (40 mg total) by mouth daily. 30 tablet 5  . lisinopril (PRINIVIL,ZESTRIL) 40 MG tablet Take 1 tablet (40 mg total) by mouth daily. 30 tablet 1  . PHENobarbital (LUMINAL) 64.8 MG tablet TAKE 2 TABLETS BY MOUTH EVERY DAY. 180 tablet 0  . pravastatin (PRAVACHOL) 40 MG tablet Take 1 tablet (40 mg total) by mouth daily. Need appointment before anymore refills 30 tablet 0  . zonisamide (ZONEGRAN) 100 MG capsule Take 3 capsules (300 mg total) by mouth at bedtime. 90 capsule 1   No current facility-administered medications for this visit.       Physical Exam:  BP 126/63 (BP Location: Left Leg, Patient Position: Sitting, Cuff Size: Large)   Resp 16   Ht 5\' 1"  (1.549 m)   Wt 151 lb (68.5 kg)   SpO2 97%   BMI 28.53 kg/m   General:  Well-appearing  Chest:   Clear to auscultation with symmetrical breath sounds  CV:   Regular rate and rhythm with prominent systolic murmur heard best along the sternal border  Incisions:  n/a  Abdomen:  Soft nontender  Extremities:  Warm and well-perfused, no lower  extremity edema  Diagnostic Tests:  Transthoracic Echocardiography  Patient:    Sherry [name], Streed MR #:       [id number] Study Date: 02/27/2016 Gender:     F Age:        41 Height:     154.9 cm Weight:     68.7 kg BSA:        1.74 m^2 Pt. Status: Room:   ORDERING     [place], [place] MD  SONOGRAPHER  Marygrace Drought, RCS  PERFORMING   [facility], Outpatient  cc:  ------------------------------------------------------------------- LV EF: 85%  ------------------------------------------------------------------- Indications:      CAD (I25.10).  ------------------------------------------------------------------- History:   PMH:  CKD, Hyperlipidemia.  Murmur.  Aortic valve disease.  Transient ischemic attack.  Risk factors:  Hypertension.   ------------------------------------------------------------------- Study Conclusions  - Left ventricle: Small LV cavity. There was severe concentric   hypertrophy (consider infiltrative or hypertrophic cardiomyopathy   given appearance). The estimated ejection fraction was 85%. Wall   motion was normal; there were no regional wall motion   abnormalities. Doppler parameters are consistent with abnormal   left ventricular relaxation (grade 1 diastolic dysfunction). The   E/&'e ratio is >15, suggesting elevated LV filling pressure. - Aortic valve: Moderately calcified with moderate stenosis. There   was no regurgitation. Mean gradient (S): 26 mm Hg. Peak gradient   (S): 50 mm Hg. Valve area (Vmax): 1.05 cm^2. Valve area (Vmean):   1.1 cm^2. - Mitral valve: Calcified annulus. Mildly thickened leaflets . No   significant stenosis. Pressure half-time: 101 ms. Mean gradient   (D): 3 mm Hg. Valve area by pressure half-time: 2.06 cm^2. - Left atrium: The atrium was mildly dilated. - Right ventricle: The cavity size was mildly dilated. Systolic   function is mildly reduced. - Tricuspid  valve: There was mild regurgitation. - Pulmonary arteries: PA peak pressure: 31 mm Hg (S). - Inferior vena cava: The vessel was normal in size. The   respirophasic diameter changes were in the normal range (= 50%),   consistent with normal central venous pressure. - Pericardium, extracardiac: A trivial pericardial effusion was   identified posterior to the heart. Features were not consistent   with tamponade physiology.  Impressions:  - Compared to a prior echo in 02/2015, the LVEF is higher at >70%.   There is now moderate to severe aortic stenosis - AVA around   1-1.1 cm2. A trivial pericardial effusion persists. Infiltrative   cardiomyopathy such as amyloidosis should be considered.  ------------------------------------------------------------------- Study data:  Comparison was made to the study of 02/21/2015.  Study status:  Routine.  Procedure:  The patient reported no pain pre or post test. Transthoracic echocardiography. Image quality was adequate.          Transthoracic echocardiography.  M-mode, complete 2D, spectral Doppler, and color Doppler.  Birthdate: Patient birthdate: October 24, 1945.  Age:  Patient is  71 yr old.  Sex: Gender: female.    BMI: 28.6 kg/m^2.  Blood pressure:     170/88 Patient status:  Outpatient.  Study date:  Study date: 02/27/2016. Study time: 03:22 PM.  Location:  [place]  -------------------------------------------------------------------  ------------------------------------------------------------------- Left ventricle:  Small LV cavity. There was severe concentric hypertrophy (consider infiltrative or hypertrophic cardiomyopathy given appearance). The estimated ejection fraction was 85%. Wall motion was normal; there were no regional wall motion abnormalities. Doppler parameters are consistent with abnormal left ventricular relaxation (grade 1 diastolic dysfunction). The E/&'e ratio is >15, suggesting elevated LV filling  pressure.  ------------------------------------------------------------------- Aortic valve:  Moderately calcified with moderate stenosis. Doppler:  There was no regurgitation.    VTI ratio of LVOT to aortic valve: 0.29. Indexed valve area (VTI): 0.58 cm^2/m^2. Peak velocity ratio of LVOT to aortic valve: 0.3. Valve area (Vmax): 1.05 cm^2. Indexed valve area (Vmax): 0.6 cm^2/m^2. Mean velocity ratio of LVOT to aortic valve: 0.32. Valve area (Vmean): 1.1 cm^2. Indexed valve area (Vmean): 0.63 cm^2/m^2.    Mean gradient (S): 26 mm Hg. Peak gradient (S): 50 mm Hg.  ------------------------------------------------------------------- Aorta:  Aortic root: The aortic root was normal in size. Ascending aorta: The ascending aorta was normal in size.  ------------------------------------------------------------------- Mitral valve:   Calcified annulus. Mildly thickened leaflets . No significant stenosis.  Doppler:     Valve area by pressure half-time: 2.06 cm^2. Indexed valve area by pressure half-time: 1.18 cm^2/m^2. Indexed valve area by continuity equation (using LVOT flow): 0.84 cm^2/m^2.    Mean gradient (D): 3 mm Hg. Peak gradient (D): 5 mm Hg.  ------------------------------------------------------------------- Left atrium:  The atrium was mildly dilated.  ------------------------------------------------------------------- Atrial septum:  No defect or patent foramen ovale was identified.   ------------------------------------------------------------------- Right ventricle:  The cavity size was mildly dilated. Systolic function is mildly reduced.  ------------------------------------------------------------------- Pulmonic valve:    The valve appears to be grossly normal. Doppler:  There was trivial regurgitation.  ------------------------------------------------------------------- Tricuspid valve:   Doppler:  There was mild  regurgitation.  ------------------------------------------------------------------- Pulmonary artery:   The main pulmonary artery was normal-sized.  ------------------------------------------------------------------- Right atrium:  The atrium was at the upper limits of normal in size.  ------------------------------------------------------------------- Pericardium:  A trivial pericardial effusion was identified posterior to the heart.  Doppler:  Features were not consistent with tamponade physiology.  ------------------------------------------------------------------- Systemic veins: Inferior vena cava: The vessel was normal in size. The respirophasic diameter changes were in the normal range (= 50%), consistent with normal central venous pressure. Diameter: 11 mm.  ------------------------------------------------------------------- Measurements   IVC                                       Value          Reference  ID                                        11    mm       ---------    Left ventricle                            Value          Reference  LV ID, ED, PLAX chordal  43.4  mm       43 - 52  LV ID, ES, PLAX chordal           (L)     19.4  mm       23 - 38  LV fx shortening, PLAX chordal            55    %        >=29  LV PW thickness, ED                       16    mm       ---------  IVS/LV PW ratio, ED                       0.88           <=1.3  Stroke volume, 2D                         74    ml       ---------  Stroke volume/bsa, 2D                     42    ml/m^2   ---------  LV ejection fraction, 1-p A4C             81    %        ---------  LV e&', lateral                            6.8   cm/s     ---------  LV E/e&', lateral                          15.88          ---------  LV e&', medial                             6.14  cm/s     ---------  LV E/e&', medial                           17.59          ---------  LV e&', average                             6.47  cm/s     ---------  LV E/e&', average                          16.69          ---------    Ventricular septum                        Value          Reference  IVS thickness, ED                         14    mm       ---------    LVOT  Value          Reference  LVOT ID, S                                21.1  mm       ---------  LVOT area                                 3.5   cm^2     ---------  LVOT peak velocity, S                     106   cm/s     ---------  LVOT mean velocity, S                     74.2  cm/s     ---------  LVOT VTI, S                               26.1  cm       ---------  Stroke volume (SV), LVOT DP               91.3  ml       ---------  Stroke index (SV/bsa), LVOT DP            52.4  ml/m^2   ---------    Aortic valve                              Value          Reference  Aortic valve peak velocity, S             353   cm/s     ---------  Aortic valve mean velocity, S             235   cm/s     ---------  Aortic valve VTI, S                       90.7  cm       ---------  Aortic mean gradient, S                   26    mm Hg    ---------  Aortic peak gradient, S                   50    mm Hg    ---------  VTI ratio, LVOT/AV                        0.29           ---------  Aortic valve area/bsa, VTI                0.58  cm^2/m^2 ---------  Velocity ratio, peak, LVOT/AV             0.3            ---------  Aortic valve area, peak velocity          1.05  cm^2     ---------  Aortic valve area/bsa, peak  0.6   cm^2/m^2 ---------  velocity  Velocity ratio, mean, LVOT/AV             0.32           ---------  Aortic valve area, mean velocity          1.1   cm^2     ---------  Aortic valve area/bsa, mean               0.63  cm^2/m^2 ---------  velocity    Aorta                                     Value          Reference  Aortic root ID, ED                        26    mm       ---------    Left  atrium                               Value          Reference  LA ID, A-P, ES                            36    mm       ---------  LA ID/bsa, A-P                            2.07  cm/m^2   <=2.2  LA volume, S                              62    ml       ---------  LA volume/bsa, S                          35.6  ml/m^2   ---------  LA volume, ES, 1-p A4C                    53    ml       ---------  LA volume/bsa, ES, 1-p A4C                30.4  ml/m^2   ---------  LA volume, ES, 1-p A2C                    72    ml       ---------  LA volume/bsa, ES, 1-p A2C                41.3  ml/m^2   ---------    Mitral valve                              Value          Reference  Mitral E-wave peak velocity               108   cm/s     ---------  Mitral A-wave peak velocity  119   cm/s     ---------  Mitral mean velocity, D                   77.4  cm/s     ---------  Mitral deceleration time          (H)     243   ms       150 - 230  Mitral pressure half-time                 101   ms       ---------  Mitral mean gradient, D                   3     mm Hg    ---------  Mitral peak gradient, D                   5     mm Hg    ---------  Mitral E/A ratio, peak                    0.9            ---------  Mitral valve area, PHT, DP                2.06  cm^2     ---------  Mitral valve area/bsa, PHT, DP            1.18  cm^2/m^2 ---------  Mitral valve area/bsa, LVOT               0.84  cm^2/m^2 ---------  continuity  Mitral annulus VTI, D                     50.8  cm       ---------    Pulmonary arteries                        Value          Reference  PA pressure, S, DP                (H)     31    mm Hg    <=30    Tricuspid valve                           Value          Reference  Tricuspid regurg peak velocity            264   cm/s     ---------  Tricuspid peak RV-RA gradient             28    mm Hg    ---------  Tricuspid maximal regurg                  264   cm/s     ---------  velocity,  PISA    Systemic veins                            Value          Reference  Estimated CVP                             3     mm Hg    ---------  Right ventricle                           Value          Reference  RV pressure, S, DP                (H)     31    mm Hg    <=30  RV s&', lateral, S                         9.65  cm/s     ---------  Legend: (L)  and  (H)  mark values outside specified reference range.  ------------------------------------------------------------------- Prepared and Electronically Authenticated by  Lyman Bishop MD 2018-01-09T17:13:16   Impression:  Patient has at least stage B (progressive - moderate) bordering on stage C1 (severe asymptomatic) aortic stenosis with multivessel coronary artery disease. She continues to maintain that she is not having any symptoms of exertional shortness of breath or chest discomfort, although recently she did report some increased lower extremity edema to her primary care physician. She is not very active physically because of her severely limited physical mobility. I personally reviewed the patient's recent follow-up transthoracic echocardiogram. There is severe thickening, calcification, and restricted leaflet mobility involving all 3 of the leaflets. This appears notably worse and the peak velocity across the aortic valve has increased to 3.5 m/s which is up from 2.8 m/s when it was last checked one year previously. Left ventricular systolic function remains normal. The patient has severe left ventricular hypertrophy with likely severe diastolic dysfunction. She may well have paradoxical low flow low gradient normal ejection fraction severe aortic stenosis, and at this point the transvalvular gradients have increased considerably. However, the patient continues to maintain that she remains asymptomatic. I'm concerned about progression of disease over the past year.  Plan:  We will make sure the patient has a follow-up  appointment within the next 2-3 months with Dr. Burt Knack. We may need to increase the frequency of follow-up echocardiograms. It might be reasonable to consider stress echocardiography. I have reminded the patient and her husband regarding symptoms and signs to be concerned about. All their questions been addressed.  I spent in excess of 15 minutes during the conduct of this office consultation and >50% of this time involved direct face-to-face encounter with the patient for counseling and/or coordination of their care.    Valentina Gu. Roxy Manns, MD 04/15/2016 1:16 PM

## 2016-04-15 NOTE — Patient Instructions (Signed)
Continue all previous medications without any changes at this time  Contact Dr. Antionette Char office at once if you begin to experience shortness of breath and/or chest discomfort with or without activity or if you begin to have dizzy spells

## 2016-04-18 ENCOUNTER — Other Ambulatory Visit: Payer: Self-pay | Admitting: Internal Medicine

## 2016-04-18 DIAGNOSIS — I10 Essential (primary) hypertension: Secondary | ICD-10-CM

## 2016-05-01 ENCOUNTER — Other Ambulatory Visit: Payer: Self-pay | Admitting: *Deleted

## 2016-05-01 MED ORDER — PRAVASTATIN SODIUM 40 MG PO TABS: 40.0000 mg | ORAL_TABLET | Freq: Every day | ORAL | 3 refills | Status: DC

## 2016-05-01 NOTE — Telephone Encounter (Signed)
Has appt with [facility] on 05/08/2016

## 2016-05-08 ENCOUNTER — Encounter: Payer: Self-pay | Admitting: Internal Medicine

## 2016-05-08 ENCOUNTER — Ambulatory Visit (INDEPENDENT_AMBULATORY_CARE_PROVIDER_SITE_OTHER): Payer: Medicare Other | Admitting: Internal Medicine

## 2016-05-08 VITALS — BP 142/70 | HR 65 | Temp 98.4°F | Wt 143.3 lb

## 2016-05-08 DIAGNOSIS — I700 Atherosclerosis of aorta: Secondary | ICD-10-CM

## 2016-05-08 DIAGNOSIS — I350 Nonrheumatic aortic (valve) stenosis: Secondary | ICD-10-CM

## 2016-05-08 DIAGNOSIS — G40909 Epilepsy, unspecified, not intractable, without status epilepticus: Secondary | ICD-10-CM

## 2016-05-08 DIAGNOSIS — H538 Other visual disturbances: Secondary | ICD-10-CM

## 2016-05-08 DIAGNOSIS — I5032 Chronic diastolic (congestive) heart failure: Secondary | ICD-10-CM

## 2016-05-08 DIAGNOSIS — Z87891 Personal history of nicotine dependence: Secondary | ICD-10-CM

## 2016-05-08 DIAGNOSIS — I10 Essential (primary) hypertension: Secondary | ICD-10-CM

## 2016-05-08 DIAGNOSIS — M810 Age-related osteoporosis without current pathological fracture: Secondary | ICD-10-CM | POA: Diagnosis not present

## 2016-05-08 DIAGNOSIS — Z79899 Other long term (current) drug therapy: Secondary | ICD-10-CM

## 2016-05-08 DIAGNOSIS — B372 Candidiasis of skin and nail: Secondary | ICD-10-CM

## 2016-05-08 DIAGNOSIS — M170 Bilateral primary osteoarthritis of knee: Secondary | ICD-10-CM

## 2016-05-08 DIAGNOSIS — I110 Hypertensive heart disease with heart failure: Secondary | ICD-10-CM

## 2016-05-08 HISTORY — DX: Other visual disturbances: H53.8

## 2016-05-08 HISTORY — DX: Atherosclerosis of aorta: I70.0

## 2016-05-08 MED ORDER — NYSTATIN 100000 UNIT/GM EX CREA
1.0000 | TOPICAL_CREAM | Freq: Two times a day (BID) | CUTANEOUS | 0 refills | Status: DC
Start: 2016-05-08 — End: 2016-07-29

## 2016-05-08 NOTE — Assessment & Plan Note (Signed)
Patient with moderate-severe aortic stenosis. Repeat TTE on 02/27/2016 showed a mean AS gradient of 26 mmHg, peak 50 mmHg, valve area 1.1 cm^2. She currently denies any lightheadedness/dizziness or syncopal episodes. Physical mobility is limited due to bilateral knee OA. She continues to follow with Cardiology (Dr. Burt Knack) and CT Surgery (Dr. Roxy Manns). She may be considered for more frequent monitoring with echocardiograms and possible stress echo per Dr. Guy Sandifer note.  Patient currently denies any symptoms from her AS. She will continue with her current management with follow up arranged in May with Dr. Burt Knack.

## 2016-05-08 NOTE — Assessment & Plan Note (Signed)
Atherosclerosis of thoracic aorta seen on CXR January 2015. She is currently on Pravastatin 40 mg daily which we will continue.

## 2016-05-08 NOTE — Assessment & Plan Note (Signed)
Currently takes Amlodipine 10 mg daily, Lisinopril 40 mg daily, and Lasix 40 mg daily. BP this visit is 142/70.  BP is controlled. Will continue current management in setting of mod-severe aortic stenosis.

## 2016-05-08 NOTE — Assessment & Plan Note (Signed)
Patient denies any recent seizures. She is scheduled for appointment with neurology for which she is aware. She will continue her Phenobarbital 129.6 mg daily and Zonisamide 300 mg qhs.

## 2016-05-08 NOTE — Assessment & Plan Note (Signed)
Patient's CHF is stable. She is asymptomatic and continues to limit sodium and fluid intake, check daily weights, wear compression stockings, and elevate her legs. We will continue her Lasix 40 mg daily and Lisinopril 40 mg daily.

## 2016-05-08 NOTE — Assessment & Plan Note (Signed)
Patient with mild appearing intertriginous candidiasis of the intergluteal cleft. Will treat with topical nystatin cream.

## 2016-05-08 NOTE — Progress Notes (Signed)
CC: HTN  HPI:  [name] is a 71 y.o. female with PMH as listed below including mod-severe Aortic Stenosis, diastolic CHF, HTN, CKD 3, and Epilepsy who presents for follow up management of her HTN.  Diastolic CHF: Patient currently takes Lasix 40 mg daily. She limits salt and fluid intake. She continues to [place] her compression stockings daily and elevating her feet at night. She denies any shortness of breath, increased dyspnea on exertion, or increased orthopnea (usually sleeps with 5-6 pillows beneath her head at night). Her weight is down to 143 lbs from 151 lbs. She feels at her baseline today. Repeat TTE on 02/27/2016 showed an EF of 85% with severe concentric hypertrophy. She continues to follow with her cardiothoracic surgeon Dr. Roxy Manns and her cardiologist Dr. Burt Knack.  Aortic Stenosis: Patient with moderate-severe aortic stenosis. Repeat TTE on 02/27/2016 showed a mean AS gradient of 26 mmHg, peak 50 mmHg, valve area 1.1 cm^2. She currently denies any lightheadedness/dizziness or syncopal episodes. Physical mobility is limited due to bilateral knee OA. She continues to follow with Cardiology (Dr. Burt Knack) and CT Surgery (Dr. Roxy Manns). She may be considered for more frequent monitoring with echocardiograms and possible stress echo per Dr. Guy Sandifer note.  HTN: Currently takes Amlodipine 10 mg daily, Lisinopril 40 mg daily, and Lasix 40 mg daily. BP this visit is 142/70.  Epilepsy: Patient currently takes Phenobarbital 129.6 mg daily and Zonisamide 300 mg qhs for her history of seizures. She has previously followed with neurology in [facility], but has not been back due to difficulty with travelling. She has not had a seizure in years. She has been referred to Neurology in [place] and has an appointment on 05/20/2016. Her phenobarbital level was 28 mcg/mL on 03/13/2016.  Thoracic aortic atherosclerosis: Atherosclerosis of thoracic aorta seen on CXR January 2015. She is currently on  Pravastatin 40 mg daily.  Osteoporosis: Patient started on Fosamax 70 mg weekly in September 2017 after DEXA scan showed T-score of -2.8 at the right femur neck. She is tolerating Fosamax well.  Intertriginous Candidiasis of Intergluteal Cleft: Patient reports a pruritic rash in her gluteal cleft which has not responded to lotions or moisturizers.   Blurry Vision: Patient has bilateral blurry vision for which she wears reading glasses. She has noticed her vision has been a little more blurry recently and thinks she needs to update her corrective lenses. She has not seen an eye specialist for some time.  Past Medical History:  Diagnosis Date  . Anemia   . Aortic stenosis   . Arthritis   . Bilateral lower extremity edema   . Bradycardia    a. 01/2013 - asymptomatic.  [name] Chronic diastolic heart failure ([place])   . CKD (chronic kidney disease)    a. baseline CKD stage III  . Coronary artery disease   . GERD (gastroesophageal reflux disease)   . Headache(784.0)   . Heart murmur   . Hyperlipidemia   . Hypertension   . Mobitz (type) I (Wenckebach's) atrioventricular block    a. 01/2013 - asymptomatic.  . Morbid obesity ([place])   . Seizure disorder ([place])   . Seizures ([place])   . TIA (transient ischemic attack) 2014   pt stated she had "mini strokes"    Review of Systems:   Review of Systems  Constitutional: Positive for weight loss. Negative for chills and fever.  Respiratory: Negative for shortness of breath.   Cardiovascular: Negative for chest pain, palpitations and leg swelling.  Musculoskeletal: Negative for  falls.  Skin: Positive for rash.       Rash upper buttocks  Neurological: Negative for dizziness, sensory change, focal weakness, seizures, loss of consciousness and weakness.     Physical Exam:  Vitals:   05/08/16 1401  BP: (!) 142/70  Pulse: 65  Temp: 98.4 F (36.9 C)  TempSrc: Oral  SpO2: 100%  Weight: 143 lb 4.8 oz (65 kg)   Physical Exam  Constitutional:  She is oriented to person, place, and time. She appears well-developed and well-nourished. No distress.  Resting comfortably in wheelchair  HENT:  Head: Normocephalic and atraumatic.  Cardiovascular: Normal rate and regular rhythm.   4/6 systolic murmur loudest at RUS border with radiation to carotids  Pulmonary/Chest: Effort normal. No respiratory distress. She has no wheezes.  Musculoskeletal: She exhibits no edema or tenderness.  Neurological: She is alert and oriented to person, place, and time.  Skin: She is not diaphoretic.  Mild Intertriginous candidiasis at superior intergluteal cleft  Psychiatric: She has a normal mood and affect.    Assessment & Plan:   See Encounters Tab for problem based charting.  Patient discussed with Dr. Angelia Mould  Thoracic aortic atherosclerosis [facility]) Atherosclerosis of thoracic aorta seen on CXR January 2015. She is currently on Pravastatin 40 mg daily which we will continue.  Chronic diastolic congestive heart failure Patient's CHF is stable. She is asymptomatic and continues to limit sodium and fluid intake, check daily weights, wear compression stockings, and elevate her legs. We will continue her Lasix 40 mg daily and Lisinopril 40 mg daily.  Aortic stenosis Patient with moderate-severe aortic stenosis. Repeat TTE on 02/27/2016 showed a mean AS gradient of 26 mmHg, peak 50 mmHg, valve area 1.1 cm^2. She currently denies any lightheadedness/dizziness or syncopal episodes. Physical mobility is limited due to bilateral knee OA. She continues to follow with Cardiology (Dr. Burt Knack) and CT Surgery (Dr. Roxy Manns). She may be considered for more frequent monitoring with echocardiograms and possible stress echo per Dr. Guy Sandifer note.  Patient currently denies any symptoms from her AS. She will continue with her current management with follow up arranged in May with Dr. Burt Knack.  Essential hypertension Currently takes Amlodipine 10 mg daily, Lisinopril 40 mg  daily, and Lasix 40 mg daily. BP this visit is 142/70.  BP is controlled. Will continue current management in setting of mod-severe aortic stenosis.  Epilepsy Patient denies any recent seizures. She is scheduled for appointment with neurology for which she is aware. She will continue her Phenobarbital 129.6 mg daily and Zonisamide 300 mg qhs.  Osteoporosis Patient started on Fosamax 70 mg weekly in September 2017 after DEXA scan showed T-score of -2.8 at the right femur neck. She is tolerating Fosamax well. - Continue Fosamax 70 mg weekly  Intertriginous candidiasis Patient with mild appearing intertriginous candidiasis of the intergluteal cleft. Will treat with topical nystatin cream.  Blurry vision, bilateral Patient has bilateral blurry vision for which she wears reading glasses. She has noticed her vision has been a little more blurry recently and thinks she needs to update her corrective lenses. She has not seen an eye specialist for some time. - Refer to optometry

## 2016-05-08 NOTE — Assessment & Plan Note (Signed)
Patient has bilateral blurry vision for which she wears reading glasses. She has noticed her vision has been a little more blurry recently and thinks she needs to update her corrective lenses. She has not seen an eye specialist for some time. - Refer to optometry

## 2016-05-08 NOTE — Patient Instructions (Signed)
It was a pleasure to see you again Sherry [name].  Please keep up the good work and continue your current medications.  I have sent a Nystatin cream for your rash. Use this twice a day for 2 weeks/until your rash clears.  Please follow up with the Neurologist and Heart doctors as scheduled.  Please follow up with me in 6 months or sooner if needed.  Nystatin skin cream or ointment What is this medicine? NYSTATIN (nye STAT in) is an antifungal medicine. It is used to treat certain kinds of fungal or yeast infections of the skin. This medicine may be used for other purposes; ask your health care provider or pharmacist if you have questions. COMMON BRAND NAME(S): Mycostatin, Nystex, Pediaderm AF What should I tell my health care provider before I take this medicine? They need to know if you have any of these conditions: -an unusual or allergic reaction to nystatin, other foods, dyes or preservatives -pregnant or trying to get pregnant -breast-feeding How should I use this medicine? This medicine is for external use on the skin only. Follow the directions on the prescription label. Wash hands before and after use. If treating a hand or nail infection, wash hands before use only. Apply a thin layer of this medicine to cover the affected skin and surrounding area. You can cover the area with a sterile gauze dressing (bandage). Do not use an airtight bandage (such as a plastic-covered bandage). Do not get the medicine in your eyes. If you do, rinse out with plenty of cool tap water. Use the full course of treatment prescribed, even if you think the infection is getting better. Use at regular intervals. Do not use your medicine more often than directed. Do not use this medicine for any condition other than the one for which it was prescribed. Talk to your pediatrician regarding the use of this medicine in children. Special care may be needed. Overdosage: If you think you have taken too much of this  medicine contact a poison control center or emergency room at once. NOTE: This medicine is only for you. Do not share this medicine with others. What if I miss a dose? If you miss a dose, use it as soon as you can. If it is almost time for your next dose, use only that dose. Do not use double or extra doses. What may interact with this medicine? Interactions are not expected. Do not use any other skin products on the affected area without telling your doctor or health care professional. This list may not describe all possible interactions. Give your health care provider a list of all the medicines, herbs, non-prescription drugs, or dietary supplements you use. Also tell them if you smoke, drink alcohol, or use illegal drugs. Some items may interact with your medicine. What should I watch for while using this medicine? Tell your doctor or health care professional if your symptoms do not improve after 3 days. After bathing make sure that your skin is very dry. Fungal infections like moist conditions. Do not walk around barefoot. To help prevent reinfection, wear freshly washed cotton, not synthetic, clothing. What side effects may I notice from receiving this medicine? Side effects that usually do not require medical attention (report to your doctor or health care professional if they continue or are bothersome): -skin irritation This list may not describe all possible side effects. Call your doctor for medical advice about side effects. You may report side effects to FDA at 1-800-FDA-1088. Where should  I keep my medicine? Keep out of the reach of children. Store at room temperature 15 to 30 degrees C (59 to 86 degrees F). Throw away any unused medicine after the expiration date. NOTE: This sheet is a summary. It may not cover all possible information. If you have questions about this medicine, talk to your doctor, pharmacist, or health care provider.  2018 Elsevier/Gold Standard (2015-03-08  16:28:30)

## 2016-05-08 NOTE — Assessment & Plan Note (Signed)
Patient started on Fosamax 70 mg weekly in September 2017 after DEXA scan showed T-score of -2.8 at the right femur neck. She is tolerating Fosamax well. - Continue Fosamax 70 mg weekly

## 2016-05-09 ENCOUNTER — Other Ambulatory Visit: Payer: Self-pay

## 2016-05-13 NOTE — Progress Notes (Signed)
Internal Medicine Clinic Attending  Case discussed with Dr. Patel,Vishal at the time of the visit.  We reviewed the resident's history and exam and pertinent patient test results.  I agree with the assessment, diagnosis, and plan of care documented in the resident's note.  

## 2016-05-20 ENCOUNTER — Ambulatory Visit (INDEPENDENT_AMBULATORY_CARE_PROVIDER_SITE_OTHER): Payer: Medicare Other | Admitting: Neurology

## 2016-05-20 ENCOUNTER — Encounter: Payer: Self-pay | Admitting: Neurology

## 2016-05-20 VITALS — BP 136/68 | HR 61 | Temp 98.5°F | Resp 16 | Ht 61.0 in | Wt 140.6 lb

## 2016-05-20 DIAGNOSIS — G40209 Localization-related (focal) (partial) symptomatic epilepsy and epileptic syndromes with complex partial seizures, not intractable, without status epilepticus: Secondary | ICD-10-CM | POA: Diagnosis not present

## 2016-05-20 MED ORDER — PHENOBARBITAL 64.8 MG PO TABS: 129.6000 mg | ORAL_TABLET | Freq: Every day | ORAL | 3 refills | Status: DC

## 2016-05-20 MED ORDER — ZONISAMIDE 100 MG PO CAPS: ORAL_CAPSULE | ORAL | 6 refills | Status: DC

## 2016-05-20 NOTE — Progress Notes (Signed)
NEUROLOGY CONSULTATION NOTE  Sherry [name] MRN: [id number] DOB: 1945/04/11  Referring provider: Dr. Zada Finders Primary care provider: Dr. Zada Finders  Reason for consult:  Establish care for seizures  Dear Dr Posey Pronto:  Thank you for your kind referral of Sherry [name] for consultation of the above symptoms. Although her history is well known to you, please allow me to reiterate it for the purpose of our medical record. The patient was accompanied to the clinic by her husband who also provides collateral information. Records and images were personally reviewed where available.  HISTORY OF PRESENT ILLNESS: This is a pleasant 71 year old right-handed woman with a history of hypertension, hyperlipidemia, AV block, CAD, and seizures, presenting to establish care for epilepsy. Records from [facility] were reviewed. She started having seizures in the 70s (age 8s), initially she was having generalized convulsions often preceded by a sensation of severe nausea, "sick to stomach," followed by eyes rolling up, shaking of both arms and legs with tongue bite. They were initially occurring every 2 weeks while on Dilantin, none since adding Phenobarbital. They report the last GTC was probably in her 81s (>10 years ago). Family would also note episodes of unresponsiveness where her head drops forward slightly and she does not respond. She would be amnestic of these and feel like she woke up from sleep. Her daughter previously reported she seemed confused afterwards. There is note that these resolved after phenobarbital reduction in 2013, however her husband reports occasional episodes where he would be talking to her, then he comes back in the room and she would look at him like they did not talk earlier, she would not understand and say "huh?" and he asks if she heard him. He last noticed this around 1.5 months ago, prior to that was 7-8 months ago. She continues to have the sensation of a sickening  feeling every few months or so, these always seem to occur when she is in the bath. She had one 2 weeks ago, these last up to a minute, she denies any confusion or focal symptoms. She denies any olfactory/gustatory hallucinations, deja vu, focal numbness/tingling/weakness, myoclonic jerks. She notices her hand twitches sometimes. Her husband denies any nocturnal seizures.  She is currently taking Phenobarbital 64.8mg  2 tablets daily and Zonisamide 300mg  qhs. She was previously drowsy on higher doses of Phenobarbital, and with reduction of dose, she currently denies any side effects to her medications. She denies any headaches, dizziness, diplopia, dysarthria/dysphagia, neck/back pain, bladder dysfunction. She has constipation. She needs a walker to ambulate and denies any falls. She denies any focal symptoms but reports tingling in both hands and weakness in both legs, no pain. She reports sleep is good, and reports good compliance to medications. She reports a history of migraines, but these have resolved.  Epilepsy Risk Factors:  A maternal cousin had seizures when younger. Otherwise she had a normal birth and early development.  There is no history of febrile convulsions, CNS infections such as meningitis/encephalitis, significant traumatic brain injury, neurosurgical procedures.  Prior AEDs: Dilantin (ineffective) Laboratory Data: Phenobarbital level 03/13/16 - 28. BMP showed a creatinine of 1.32, BUN 28; vitamin D level in 09/2015 was 25.9 EEG per [facility] notes: mild diffuse slowing on routine EEG MRI per [facility] notes: showed evidence of old strokes  PAST MEDICAL HISTORY: Past Medical History:  Diagnosis Date  . Anemia   . Aortic stenosis   . Arthritis   . Bilateral lower extremity edema   . Bradycardia  a. 01/2013 - asymptomatic.  [name] Chronic diastolic heart failure ([place])   . CKD (chronic kidney disease)    a. baseline CKD stage III  . Coronary artery disease   . GERD (gastroesophageal  reflux disease)   . Headache(784.0)   . Heart murmur   . Hyperlipidemia   . Hypertension   . Mobitz (type) I (Wenckebach's) atrioventricular block    a. 01/2013 - asymptomatic.  . Morbid obesity ([place])   . Seizure disorder ([place])   . Seizures ([place])   . TIA (transient ischemic attack) 2014   pt stated she had "mini strokes"    PAST SURGICAL HISTORY: Past Surgical History:  Procedure Laterality Date  . ABDOMINAL HYSTERECTOMY    . LEFT AND RIGHT HEART CATHETERIZATION WITH CORONARY ANGIOGRAM N/A 05/24/2013   Procedure: LEFT AND RIGHT HEART CATHETERIZATION WITH CORONARY ANGIOGRAM;  Surgeon: Sinclair Grooms, MD;  Location: [facility] CATH LAB;  Service: Cardiovascular;  Laterality: N/A;    MEDICATIONS: Current Outpatient Prescriptions on File Prior to Visit  Medication Sig Dispense Refill  . alendronate (FOSAMAX) 70 MG tablet TAKE 1 TABLET BY MOUTH ONCE WEEKLY WITH A FULL GLASS OF WATER ON AN EMPTY STOMACH. 12 tablet 2  . amLODipine (NORVASC) 10 MG tablet Take 1 tablet (10 mg total) by mouth daily. 90 tablet 3  . aspirin 81 MG EC tablet Take 81 mg by mouth daily.     . Calcium Carb-Cholecalciferol (CALCIUM 500 +D) 500-400 MG-UNIT TABS Take 1 tablet by mouth 2 (two) times daily. 60 tablet 2  . furosemide (LASIX) 40 MG tablet Take 1 tablet (40 mg total) by mouth daily. 30 tablet 5  . lisinopril (PRINIVIL,ZESTRIL) 40 MG tablet TAKE 1 TABLET(40 MG) BY MOUTH DAILY 90 tablet 3  . nystatin cream (MYCOSTATIN) Apply 1 application topically 2 (two) times daily. 30 g 0  . PHENobarbital (LUMINAL) 64.8 MG tablet TAKE 2 TABLETS BY MOUTH EVERY DAY. 180 tablet 0  . pravastatin (PRAVACHOL) 40 MG tablet Take 1 tablet (40 mg total) by mouth daily. 90 tablet 3  . zonisamide (ZONEGRAN) 100 MG capsule Take 3 capsules (300 mg total) by mouth at bedtime. 90 capsule 1   No current facility-administered medications on file prior to visit.     ALLERGIES: Allergies  Allergen Reactions  . Ibuprofen Other (See Comments)     Dr advised pt not to take ibuprofen    FAMILY HISTORY: Family History  Problem Relation Age of Onset  . Hypertension      SOCIAL HISTORY: Social History   Social History  . Marital status: Married    Spouse name: N/A  . Number of children: N/A  . Years of education: N/A   Occupational History  . Not on file.   Social History Main Topics  . Smoking status: Former Smoker    Types: Cigarettes    Quit date: 06/03/2000  . Smokeless tobacco: Never Used  . Alcohol use No  . Drug use: No  . Sexual activity: Not Currently   Other Topics Concern  . Not on file   Social History Narrative  . No narrative on file    REVIEW OF SYSTEMS: Constitutional: No fevers, chills, or sweats, no generalized fatigue, change in appetite Eyes: No visual changes, double vision, eye pain Ear, nose and throat: No hearing loss, ear pain, nasal congestion, sore throat Cardiovascular: No chest pain, palpitations Respiratory:  No shortness of breath at rest or with exertion, wheezes GastrointestinaI: No nausea, vomiting, diarrhea, abdominal pain,  fecal incontinence Genitourinary:  No dysuria, urinary retention or frequency Musculoskeletal:  No neck pain, back pain Integumentary: No rash, pruritus, skin lesions Neurological: as above Psychiatric: No depression, insomnia, anxiety Endocrine: No palpitations, fatigue, diaphoresis, mood swings, change in appetite, change in weight, increased thirst Hematologic/Lymphatic:  No anemia, purpura, petechiae. Allergic/Immunologic: no itchy/runny eyes, nasal congestion, recent allergic reactions, rashes  PHYSICAL EXAM: Vitals:   05/20/16 1022  BP: 136/68  Pulse: 61  Resp: 16  Temp: 98.5 F (36.9 C)   General: No acute distress, sitting on wheelchair Head:  Normocephalic/atraumatic Eyes: Fundoscopic exam shows bilateral sharp discs, no vessel changes, exudates, or hemorrhages Neck: supple, no paraspinal tenderness, full range of motion Back: No  paraspinal tenderness Heart: regular rate and rhythm Lungs: Clear to auscultation bilaterally. Vascular: No carotid bruits. Skin/Extremities: No rash, no edema Neurological Exam: Mental status: alert and oriented to person, place, and time, no dysarthria or aphasia, Fund of knowledge is appropriate.  Recent and remote memory are intact.  Attention and concentration are normal.    Able to name objects and repeat phrases. Cranial nerves: CN I: not tested CN II: pupils equal, round and reactive to light, visual fields intact, fundi unremarkable. CN III, IV, VI:  full range of motion, no nystagmus, no ptosis CN V: facial sensation intact CN VII: shallow left nasolabial fold CN VIII: hearing intact to finger rub CN IX, X: gag intact, uvula midline CN XI: sternocleidomastoid and trapezius muscles intact CN XII: tongue midline Bulk & Tone: normal, no fasciculations. Motor: 5/5 throughout with no pronator drift. Sensation: decreased pin on the left UE and LE, otherwise intact to light touch, cold,ibration and joint position sense.  No extinction to double simultaneous stimulation.   Deep Tendon Reflexes: +2 on both UE, unable to elicit in both LE Plantar responses: downgoing bilaterally Cerebellar: no incoordination on finger to nose testing Gait: not tested, sitting on wheelchair, needs walker to ambulate (no walker today) Tremor: none  IMPRESSION: This is a pleasant 71 year old right-handed woman with a history of  hypertension, hyperlipidemia, AV block, CAD, and seizures, presenting to establish care for epilepsy. Seizures suggestive of focal to bilateral tonic-clonic epilepsy possibly of temporal lobe origin. She reports a sickening feeling, and family has reported episodes of unresponsiveness. No convulsions for >10 years. She is taking Phenobarbital 64.8mg  2 tablets daily and Zonisamide 300mg  qhs. We discussed simple partial seizures (sickening feeling), and the episodes where her husband  notes some possible loss of time, a 1-hour EEG will be ordered to further classify her seizures. She will increase Zonisamide to 400mg  qhs and keep a calendar of her symptoms. Her vitamin [place] level was low last August 2017, continue to monitor levels with her PCP, as long-term Phenobarbital use can have a negative effect on bone health, if not done, would recommend a bone density scan. She does not drive. She will follow-up in 4 months and knows to call for any changes.   Thank you for allowing me to participate in the care of this patient. Please do not hesitate to call for any questions or concerns.   Ellouise Newer, M.D.  CC: Dr. Posey Pronto

## 2016-05-20 NOTE — Patient Instructions (Addendum)
1. Increase Zonisamide 100mg : take 4 capsules at night 2. Continue Phenobarbital 3. Schedule 1-hour EEG 4. Keep a calendar of your symptoms 5. Follow-up in 4 months, call for any changes  Seizure Precautions: 1. If medication has been prescribed for you to prevent seizures, take it exactly as directed.  Do not stop taking the medicine without talking to your doctor first, even if you have not had a seizure in a long time.   2. Avoid activities in which a seizure would cause danger to yourself or to others.  Don't operate dangerous machinery, swim alone, or climb in high or dangerous places, such as on ladders, roofs, or girders.  Do not drive unless your doctor says you may.  3. If you have any warning that you may have a seizure, lay down in a safe place where you can't hurt yourself.    4.  No driving for 6 months from last seizure, as per [facility].   Please refer to the following link on the [place] website for more information: [email or web address]   5.  Maintain good sleep hygiene. Avoid alcohol.  6.  Contact your doctor if you have any problems that may be related to the medicine you are taking.  7.  Call 911 and bring the patient back to the ED if:        A.  The seizure lasts longer than 5 minutes.       B.  The patient doesn't awaken shortly after the seizure  C.  The patient has new problems such as difficulty seeing, speaking or moving  D.  The patient was injured during the seizure  E.  The patient has a temperature over 102 F (39C)  F.  The patient vomited and now is having trouble breathing

## 2016-05-29 ENCOUNTER — Ambulatory Visit (INDEPENDENT_AMBULATORY_CARE_PROVIDER_SITE_OTHER): Payer: Medicare Other | Admitting: Neurology

## 2016-05-29 DIAGNOSIS — G40209 Localization-related (focal) (partial) symptomatic epilepsy and epileptic syndromes with complex partial seizures, not intractable, without status epilepticus: Secondary | ICD-10-CM | POA: Diagnosis not present

## 2016-05-31 ENCOUNTER — Telehealth: Payer: Self-pay | Admitting: Neurology

## 2016-05-31 NOTE — Telephone Encounter (Signed)
Dsicussed EEG showing right temporal epileptiform discharges. She is taking Phenobarbital and Zonisamide, she is tolerating higher dose zonisamide 400mg  qhs. She will keep a calendar of symptoms and f/u as scheduled.

## 2016-05-31 NOTE — Procedures (Signed)
ELECTROENCEPHALOGRAM REPORT  Date of Study: 05/29/2016  Patient's Name: Sherry [name] MRN: [id number] Date of Birth: September 01, 1945  Referring Provider: Dr. Ellouise Newer  Clinical History: This is a 71 year old woman with recurrent episodes of epigastric sensation and unresponsiveness.  Medications: ZONEGRAN  FOSAMAX  NORVASC aspirin 81 MG     CALCIUM 500 +D LASIX PRINIVIL,ZESTRIL MYCOSTATIN LUMINAL PRAVACHOL  Technical Summary: A multichannel digital 1-hour EEG recording measured by the international 10-20 system with electrodes applied with paste and impedances below 5000 ohms performed in our laboratory with EKG monitoring in an awake and asleep patient.  Hyperventilation was not performed. Photic stimulation was performed.  The digital EEG was referentially recorded, reformatted, and digitally filtered in a variety of bipolar and referential montages for optimal display.    Description: The patient is awake and asleep during the recording.  During maximal wakefulness, there is a poorly sustained medium voltage 8 Hz posterior dominant rhythm that attenuates with eye opening. There is occasional focal 4-5 Hz theta slowing over the right temporal region, at times sharply contoured. During drowsiness and sleep, there is an increase in theta slowing of the background.  Vertex waves and symmetric sleep spindles were seen.  Photic stimulation did not elicit any abnormalities.  There were frequent right frontotemporal broad sharp waves seen. No electrographic seizures noted.  EKG lead showed sinus bradycardia at 54 bpm.  Impression: This 1-hour awake and asleep EEG is abnormal due to the presence of:  1. Occasional focal slowing over the right temporal region 2. Frequent right frontotemporal epileptiform discharges  Clinical Correlation of the above findings indicates focal cerebral dysfunction over the right temporal region suggestive of underlying structural or physiologic  abnormality, with a tendency for seizures to arise from this region. Clinical correlation is advised.   Ellouise Newer, M.D.

## 2016-06-29 ENCOUNTER — Other Ambulatory Visit: Payer: Self-pay | Admitting: Adult Health

## 2016-07-08 ENCOUNTER — Ambulatory Visit: Payer: Medicare Other | Admitting: Cardiovascular Disease

## 2016-07-18 ENCOUNTER — Other Ambulatory Visit: Payer: Self-pay | Admitting: Internal Medicine

## 2016-07-18 DIAGNOSIS — I5032 Chronic diastolic (congestive) heart failure: Secondary | ICD-10-CM

## 2016-07-29 ENCOUNTER — Encounter: Payer: Self-pay | Admitting: Cardiovascular Disease

## 2016-07-29 ENCOUNTER — Ambulatory Visit (INDEPENDENT_AMBULATORY_CARE_PROVIDER_SITE_OTHER): Payer: Medicare Other | Admitting: Cardiovascular Disease

## 2016-07-29 VITALS — BP 130/70 | HR 52 | Ht 61.0 in | Wt 140.2 lb

## 2016-07-29 DIAGNOSIS — I350 Nonrheumatic aortic (valve) stenosis: Secondary | ICD-10-CM | POA: Diagnosis not present

## 2016-07-29 DIAGNOSIS — I10 Essential (primary) hypertension: Secondary | ICD-10-CM | POA: Diagnosis not present

## 2016-07-29 DIAGNOSIS — I5032 Chronic diastolic (congestive) heart failure: Secondary | ICD-10-CM | POA: Diagnosis not present

## 2016-07-29 NOTE — Patient Instructions (Signed)
Medication Instructions:  Your physician recommends that you continue on your current medications as directed. Please refer to the Current Medication list given to you today.  Labwork: No new orders.   Testing/Procedures: Your physician has requested that you have an echocardiogram in January 2019. Echocardiography is a painless test that uses sound waves to create images of your heart. It provides your doctor with information about the size and shape of your heart and how well your heart's chambers and valves are working. This procedure takes approximately one hour. There are no restrictions for this procedure.  Follow-Up: Your physician wants you to follow-up in: January 2019 with Dr Burt Knack.  You will receive a reminder letter in the mail two months in advance. If you don't receive a letter, please call our office to schedule the follow-up appointment.   Any Other Special Instructions Will Be Listed Below (If Applicable).     If you need a refill on your cardiac medications before your next appointment, please call your pharmacy.

## 2016-07-29 NOTE — Progress Notes (Signed)
Cardiology Office Note Date:  07/29/2016   ID:  Sherry, [name] 1945/02/23, MRN [id number]  PCP:  Zada Finders, MD  Cardiologist:  Sherren Mocha, MD    Chief Complaint  Patient presents with  . Follow-up     History of Present Illness: Sherry [name] is a 71 y.o. female who presents for follow-up of CAD and aortic stenosis. She's been followed for aortic stenosis and severe LVH, noted to have minimal symptoms over time. The patient underwent catheterization in 2015 demonstrating three vessel CAD. She's had no anginal symptoms. Most recent echo demonstrated findings consistent with moderate aortic stenosis and severe LVH with small pericardial effusion.   The patient reports no cardiac symptoms today. Today, she denies symptoms of palpitations, chest pain, shortness of breath, orthopnea, PND, lower extremity edema, dizziness, or syncope. States she's able to walk around the house and do chores with no dyspnea or chest pain.    Past Medical History:  Diagnosis Date  . Anemia   . Aortic stenosis   . Arthritis   . Bilateral lower extremity edema   . Bradycardia    a. 01/2013 - asymptomatic.  [name] Chronic diastolic heart failure ([place])   . CKD (chronic kidney disease)    a. baseline CKD stage III  . Coronary artery disease   . GERD (gastroesophageal reflux disease)   . Headache(784.0)   . Heart murmur   . Hyperlipidemia   . Hypertension   . Mobitz (type) I (Wenckebach's) atrioventricular block    a. 01/2013 - asymptomatic.  . Morbid obesity ([place])   . Seizure disorder ([place])   . Seizures ([place])   . TIA (transient ischemic attack) 2014   pt stated she had "mini strokes"    Past Surgical History:  Procedure Laterality Date  . ABDOMINAL HYSTERECTOMY    . LEFT AND RIGHT HEART CATHETERIZATION WITH CORONARY ANGIOGRAM N/A 05/24/2013   Procedure: LEFT AND RIGHT HEART CATHETERIZATION WITH CORONARY ANGIOGRAM;  Surgeon: Sinclair Grooms, MD;  Location: [facility] LAB;  Service:  Cardiovascular;  Laterality: N/A;    Current Outpatient Prescriptions  Medication Sig Dispense Refill  . alendronate (FOSAMAX) 70 MG tablet TAKE 1 TABLET BY MOUTH ONCE WEEKLY WITH A FULL GLASS OF WATER ON AN EMPTY STOMACH. 12 tablet 2  . amLODipine (NORVASC) 10 MG tablet Take 1 tablet (10 mg total) by mouth daily. 90 tablet 3  . aspirin 81 MG EC tablet Take 81 mg by mouth daily.     . Calcium Carb-Cholecalciferol (CALCIUM 500 +D) 500-400 MG-UNIT TABS Take 1 tablet by mouth 2 (two) times daily. 60 tablet 2  . furosemide (LASIX) 40 MG tablet TAKE 1 TABLET BY MOUTH DAILY 90 tablet 1  . lisinopril (PRINIVIL,ZESTRIL) 40 MG tablet TAKE 1 TABLET(40 MG) BY MOUTH DAILY 90 tablet 3  . PHENobarbital (LUMINAL) 64.8 MG tablet Take 2 tablets (129.6 mg total) by mouth daily. 180 tablet 3  . potassium chloride SA (K-DUR,KLOR-CON) 20 MEQ tablet Take 20 mEq by mouth as directed. Take 1 tablet by mouth with each extra dose of furosemide  0  . pravastatin (PRAVACHOL) 40 MG tablet Take 1 tablet (40 mg total) by mouth daily. 90 tablet 3  . zonisamide (ZONEGRAN) 100 MG capsule Take 400 mg by mouth daily.     No current facility-administered medications for this visit.     Allergies:   Ibuprofen   Social History:  The patient  reports that she quit smoking about 16 years ago. Her smoking  use included Cigarettes. She has never used smokeless tobacco. She reports that she does not drink alcohol or use drugs.   Family History:  The patient's  family history is not on file.    ROS:  Please see the history of present illness.  Otherwise, review of systems is positive for difficulty urinating.  All other systems are reviewed and negative.    PHYSICAL EXAM: VS:  BP 130/70   Pulse (!) 52   Ht 5\' 1"  (1.549 m)   Wt 140 lb 3.2 oz (63.6 kg)   BMI 26.49 kg/m  , BMI Body mass index is 26.49 kg/m. GEN: pleasant elderly woman in wheelchair, in no acute distress  HEENT: normal  Neck: no JVD, no masses. No carotid  bruits Cardiac: RRR with 3/6 harsh late peaking systolic murmur at the RUSB              Respiratory:  clear to auscultation bilaterally, normal work of breathing GI: soft, nontender, nondistended, + BS MS: no deformity or atrophy  Ext: no pretibial edema Skin: warm and dry, no rash Neuro:  Strength and sensation are intact Psych: euthymic mood, full affect  EKG:  EKG is not ordered today.  Recent Labs: 03/13/2016: BUN 28; Creatinine, Ser 1.32; Potassium 4.2; Sodium 140   Lipid Panel     Component Value Date/Time   CHOL 174 01/16/2015 1040   TRIG 58 01/16/2015 1040   HDL 76 01/16/2015 1040   CHOLHDL 2.3 01/16/2015 1040   VLDL 12 01/16/2015 1040   LDLCALC 86 01/16/2015 1040      Wt Readings from Last 3 Encounters:  07/29/16 140 lb 3.2 oz (63.6 kg)  05/20/16 140 lb 9.6 oz (63.8 kg)  05/08/16 143 lb 4.8 oz (65 kg)     Cardiac Studies Reviewed: 2D Echo 02/27/2016: ------------------------------------------------------------------- Study Conclusions  - Left ventricle: Small LV cavity. There was severe concentric   hypertrophy (consider infiltrative or hypertrophic cardiomyopathy   given appearance). The estimated ejection fraction was 85%. Wall   motion was normal; there were no regional wall motion   abnormalities. Doppler parameters are consistent with abnormal   left ventricular relaxation (grade 1 diastolic dysfunction). The   E/&'e ratio is >15, suggesting elevated LV filling pressure. - Aortic valve: Moderately calcified with moderate stenosis. There   was no regurgitation. Mean gradient (S): 26 mm Hg. Peak gradient   (S): 50 mm Hg. Valve area (Vmax): 1.05 cm^2. Valve area (Vmean):   1.1 cm^2. - Mitral valve: Calcified annulus. Mildly thickened leaflets . No   significant stenosis. Pressure half-time: 101 ms. Mean gradient   (D): 3 mm Hg. Valve area by pressure half-time: 2.06 cm^2. - Left atrium: The atrium was mildly dilated. - Right ventricle: The cavity size  was mildly dilated. Systolic   function is mildly reduced. - Tricuspid valve: There was mild regurgitation. - Pulmonary arteries: PA peak pressure: 31 mm Hg (S). - Inferior vena cava: The vessel was normal in size. The   respirophasic diameter changes were in the normal range (= 50%),   consistent with normal central venous pressure. - Pericardium, extracardiac: A trivial pericardial effusion was   identified posterior to the heart. Features were not consistent   with tamponade physiology.  Impressions:  - Compared to a prior echo in 02/2015, the LVEF is higher at >70%.   There is now moderate to severe aortic stenosis - AVA around   1-1.1 cm2. A trivial pericardial effusion persists. Infiltrative   cardiomyopathy  such as amyloidosis should be considered.  ------------------------------------------------------------------- Study data:  Comparison was made to the study of 02/21/2015.  Study status:  Routine.  Procedure:  The patient reported no pain pre or post test. Transthoracic echocardiography. Image quality was adequate.          Transthoracic echocardiography.  M-mode, complete 2D, spectral Doppler, and color Doppler.  Birthdate: Patient birthdate: 10-03-1945.  Age:  Patient is 71 yr old.  Sex: Gender: female.    BMI: 28.6 kg/m^2.  Blood pressure:     170/88 Patient status:  Outpatient.  Study date:  Study date: 02/27/2016. Study time: 03:22 PM.  Location:  [facility]  -------------------------------------------------------------------  ------------------------------------------------------------------- Left ventricle:  Small LV cavity. There was severe concentric hypertrophy (consider infiltrative or hypertrophic cardiomyopathy given appearance). The estimated ejection fraction was 85%. Wall motion was normal; there were no regional wall motion abnormalities. Doppler parameters are consistent with abnormal left ventricular relaxation (grade 1 diastolic  dysfunction). The E/&'e ratio is >15, suggesting elevated LV filling pressure.  ------------------------------------------------------------------- Aortic valve:  Moderately calcified with moderate stenosis. Doppler:  There was no regurgitation.    VTI ratio of LVOT to aortic valve: 0.29. Indexed valve area (VTI): 0.58 cm^2/m^2. Peak velocity ratio of LVOT to aortic valve: 0.3. Valve area (Vmax): 1.05 cm^2. Indexed valve area (Vmax): 0.6 cm^2/m^2. Mean velocity ratio of LVOT to aortic valve: 0.32. Valve area (Vmean): 1.1 cm^2. Indexed valve area (Vmean): 0.63 cm^2/m^2.    Mean gradient (S): 26 mm Hg. Peak gradient (S): 50 mm Hg.  ------------------------------------------------------------------- Aorta:  Aortic root: The aortic root was normal in size. Ascending aorta: The ascending aorta was normal in size.  ------------------------------------------------------------------- Mitral valve:   Calcified annulus. Mildly thickened leaflets . No significant stenosis.  Doppler:     Valve area by pressure half-time: 2.06 cm^2. Indexed valve area by pressure half-time: 1.18 cm^2/m^2. Indexed valve area by continuity equation (using LVOT flow): 0.84 cm^2/m^2.    Mean gradient (D): 3 mm Hg. Peak gradient (D): 5 mm Hg.  ------------------------------------------------------------------- Left atrium:  The atrium was mildly dilated.  ------------------------------------------------------------------- Atrial septum:  No defect or patent foramen ovale was identified.   ------------------------------------------------------------------- Right ventricle:  The cavity size was mildly dilated. Systolic function is mildly reduced.  ------------------------------------------------------------------- Pulmonic valve:    The valve appears to be grossly normal. Doppler:  There was trivial regurgitation.  ------------------------------------------------------------------- Tricuspid valve:    Doppler:  There was mild regurgitation.  ------------------------------------------------------------------- Pulmonary artery:   The main pulmonary artery was normal-sized.  ------------------------------------------------------------------- Right atrium:  The atrium was at the upper limits of normal in size.  ------------------------------------------------------------------- Pericardium:  A trivial pericardial effusion was identified posterior to the heart.  Doppler:  Features were not consistent with tamponade physiology.  ------------------------------------------------------------------- Systemic veins: Inferior vena cava: The vessel was normal in size. The respirophasic diameter changes were in the normal range (= 50%), consistent with normal central venous pressure. Diameter: 11 mm.  ------------------------------------------------------------------- Measurements   IVC                                       Value          Reference  ID  11    mm       ---------    Left ventricle                            Value          Reference  LV ID, ED, PLAX chordal                   43.4  mm       43 - 52  LV ID, ES, PLAX chordal           (L)     19.4  mm       23 - 38  LV fx shortening, PLAX chordal            55    %        >=29  LV PW thickness, ED                       16    mm       ---------  IVS/LV PW ratio, ED                       0.88           <=1.3  Stroke volume, 2D                         74    ml       ---------  Stroke volume/bsa, 2D                     42    ml/m^2   ---------  LV ejection fraction, 1-p A4C             81    %        ---------  LV e&', lateral                            6.8   cm/s     ---------  LV E/e&', lateral                          15.88          ---------  LV e&', medial                             6.14  cm/s     ---------  LV E/e&', medial                           17.59          ---------  LV  e&', average                            6.47  cm/s     ---------  LV E/e&', average                          16.69          ---------    Ventricular septum  Value          Reference  IVS thickness, ED                         14    mm       ---------    LVOT                                      Value          Reference  LVOT ID, S                                21.1  mm       ---------  LVOT area                                 3.5   cm^2     ---------  LVOT peak velocity, S                     106   cm/s     ---------  LVOT mean velocity, S                     74.2  cm/s     ---------  LVOT VTI, S                               26.1  cm       ---------  Stroke volume (SV), LVOT DP               91.3  ml       ---------  Stroke index (SV/bsa), LVOT DP            52.4  ml/m^2   ---------    Aortic valve                              Value          Reference  Aortic valve peak velocity, S             353   cm/s     ---------  Aortic valve mean velocity, S             235   cm/s     ---------  Aortic valve VTI, S                       90.7  cm       ---------  Aortic mean gradient, S                   26    mm Hg    ---------  Aortic peak gradient, S                   50    mm Hg    ---------  VTI ratio, LVOT/AV                        0.29           ---------  Aortic valve  area/bsa, VTI                0.58  cm^2/m^2 ---------  Velocity ratio, peak, LVOT/AV             0.3            ---------  Aortic valve area, peak velocity          1.05  cm^2     ---------  Aortic valve area/bsa, peak               0.6   cm^2/m^2 ---------  velocity  Velocity ratio, mean, LVOT/AV             0.32           ---------  Aortic valve area, mean velocity          1.1   cm^2     ---------  Aortic valve area/bsa, mean               0.63  cm^2/m^2 ---------  velocity    Aorta                                     Value          Reference  Aortic root ID, ED                        26    mm        ---------    Left atrium                               Value          Reference  LA ID, A-P, ES                            36    mm       ---------  LA ID/bsa, A-P                            2.07  cm/m^2   <=2.2  LA volume, S                              62    ml       ---------  LA volume/bsa, S                          35.6  ml/m^2   ---------  LA volume, ES, 1-p A4C                    53    ml       ---------  LA volume/bsa, ES, 1-p A4C                30.4  ml/m^2   ---------  LA volume, ES, 1-p A2C                    72    ml       ---------  LA volume/bsa, ES, 1-p A2C                41.3  ml/m^2   ---------    Mitral valve                              Value          Reference  Mitral E-wave peak velocity               108   cm/s     ---------  Mitral A-wave peak velocity               119   cm/s     ---------  Mitral mean velocity, D                   77.4  cm/s     ---------  Mitral deceleration time          (H)     243   ms       150 - 230  Mitral pressure half-time                 101   ms       ---------  Mitral mean gradient, D                   3     mm Hg    ---------  Mitral peak gradient, D                   5     mm Hg    ---------  Mitral E/A ratio, peak                    0.9            ---------  Mitral valve area, PHT, DP                2.06  cm^2     ---------  Mitral valve area/bsa, PHT, DP            1.18  cm^2/m^2 ---------  Mitral valve area/bsa, LVOT               0.84  cm^2/m^2 ---------  continuity  Mitral annulus VTI, D                     50.8  cm       ---------    Pulmonary arteries                        Value          Reference  PA pressure, S, DP                (H)     31    mm Hg    <=30    Tricuspid valve                           Value          Reference  Tricuspid regurg peak velocity            264   cm/s     ---------  Tricuspid peak RV-RA gradient             28    mm Hg    ---------  Tricuspid maximal regurg  264   cm/s      ---------  velocity, PISA    Systemic veins                            Value          Reference  Estimated CVP                             3     mm Hg    ---------    Right ventricle                           Value          Reference  RV pressure, S, DP                (H)     31    mm Hg    <=30  RV s&', lateral, S                         9.65  cm/s     ---------  ASSESSMENT AND PLAN: 1.  Moderate/severe aortic stenosis, Stage C: clinically stable. Repeat echo in 6 months and FU at that time. Discussed natural hx of disease with patient today. Continue observation. I have reviewed her echo and suspect she has significant myocardial disease only partially related to aortic stenosis. She is remarkably asymptomatic.   2. CAD, multivessel: no angina. Continue current management.  3. Intermittent 2:1 AV block - no recent syncope or lightheadedness. Followed by Dr Rayann Heman.   4. HTN: controlled on current Rx.   Current medicines are reviewed with the patient today.  The patient does not have concerns regarding medicines.  Labs/ tests ordered today include:  No orders of the defined types were placed in this encounter.   Disposition:   FU 6 months  Signed, Sherren Mocha, MD  07/29/2016 2:52 PM    [facility] [address], [place], [place]  [address] Phone: [phone]; Fax: [phone]

## 2016-08-27 NOTE — Addendum Note (Signed)
Addended by: Hulan Fray on: 08/27/2016 06:31 PM   Modules accepted: Orders

## 2016-09-20 ENCOUNTER — Ambulatory Visit (INDEPENDENT_AMBULATORY_CARE_PROVIDER_SITE_OTHER): Payer: Medicare Other | Admitting: Neurology

## 2016-09-20 ENCOUNTER — Encounter: Payer: Self-pay | Admitting: Neurology

## 2016-09-20 VITALS — BP 106/50 | HR 56 | Ht 61.0 in | Wt 135.0 lb

## 2016-09-20 DIAGNOSIS — G40209 Localization-related (focal) (partial) symptomatic epilepsy and epileptic syndromes with complex partial seizures, not intractable, without status epilepticus: Secondary | ICD-10-CM

## 2016-09-20 DIAGNOSIS — M810 Age-related osteoporosis without current pathological fracture: Secondary | ICD-10-CM

## 2016-09-20 MED ORDER — PHENOBARBITAL 64.8 MG PO TABS: 129.6000 mg | ORAL_TABLET | Freq: Every day | ORAL | 3 refills | Status: DC

## 2016-09-20 MED ORDER — ZONISAMIDE 100 MG PO CAPS: 400.0000 mg | ORAL_CAPSULE | Freq: Every day | ORAL | 3 refills | Status: DC

## 2016-09-20 NOTE — Patient Instructions (Addendum)
1. Continue Phenobarbital 64.8mg  2 tablets daily 2. Continue Zonisamide 400mg  at bedtime 3. Keep a calendar of your symptoms 4. Follow-up in 6 months, call for any changes  Seizure Precautions: 1. If medication has been prescribed for you to prevent seizures, take it exactly as directed.  Do not stop taking the medicine without talking to your doctor first, even if you have not had a seizure in a long time.   2. Avoid activities in which a seizure would cause danger to yourself or to others.  Don't operate dangerous machinery, swim alone, or climb in high or dangerous places, such as on ladders, roofs, or girders.  Do not drive unless your doctor says you may.  3. If you have any warning that you may have a seizure, lay down in a safe place where you can't hurt yourself.    4.  No driving for 6 months from last seizure, as per [facility].   Please refer to the following link on the [place] website for more information: http://[email or web address].org/[email or web address]/driving/drivingu.cfm   5.  Maintain good sleep hygiene. Avoid alcohol.  6.  Contact your doctor if you have any problems that may be related to the medicine you are taking.  7.  Call 911 and bring the patient back to the ED if:        A.  The seizure lasts longer than 5 minutes.       B.  The patient doesn't awaken shortly after the seizure  C.  The patient has new problems such as difficulty seeing, speaking or moving  D.  The patient was injured during the seizure  E.  The patient has a temperature over 102 F (39C)  F.  The patient vomited and now is having trouble breathing

## 2016-09-20 NOTE — Progress Notes (Signed)
NEUROLOGY FOLLOW UP OFFICE NOTE  Sherry [name] [id number] [birth year]  HISTORY OF PRESENT ILLNESS: I had the pleasure of seeing Sherry [name] in follow-up in the neurology clinic on 09/20/2016.  The patient was last seen 4 months ago for epilepsy. She is again accompanied by her husband who helps supplement the history today.  Records and images were personally reviewed where available. Her 1-hour EEG was abnormal due to occasional right temporal focal slowing with frequent right frontotemporal epileptiform discharges. On her last visit, she and her husband continued to report occasional episodes of gaps in time and epigastric sensation. Zonisamide dose was increased to 400mg  qhs. She and her husband deny any other these changes since her last visit. She reports doing well on current medications. She denies any headaches, dizziness, focal numbness/tingling/weakness, no falls. She always ambulates with walker due to arthritis changes in both knees.   HPI 05/20/2016: This is a pleasant 71 yo RH woman with a history of hypertension, hyperlipidemia, AV block, CAD, and seizures. [facility] from [facility] were reviewed. She started having seizures in the 31s (age 36s), initially she was having generalized convulsions often preceded by a sensation of severe nausea, "sick to stomach," followed by eyes rolling up, shaking of both arms and legs with tongue bite. They were initially occurring every 2 weeks while on Dilantin, none since adding Phenobarbital. They report the last GTC was probably in her 18s (>10 years ago). Family would also note episodes of unresponsiveness where her head drops forward slightly and she does not respond. She would be amnestic of these and feel like she woke up from sleep. Her daughter previously reported she seemed confused afterwards. There is note that these resolved after phenobarbital reduction in 2013, however her husband reports occasional episodes where he would be  talking to her, then he comes back in the room and she would look at him like they did not talk earlier, she would not understand and say "huh?" and he asks if she heard him. He last noticed this around 1.5 months ago, prior to that was 7-8 months ago. She continues to have the sensation of a sickening feeling every few months or so, these always seem to occur when she is in the bath. She had one 2 weeks ago, these last up to a minute, she denies any confusion or focal symptoms. She denies any olfactory/gustatory hallucinations, deja vu, focal numbness/tingling/weakness, myoclonic jerks. She notices her hand twitches sometimes. Her husband denies any nocturnal seizures.  She is currently taking Phenobarbital 64.8mg  2 tablets daily and Zonisamide 300mg  qhs. She was previously drowsy on higher doses of Phenobarbital, and with reduction of dose, she currently denies any side effects to her medications. She denies any headaches, dizziness, diplopia, dysarthria/dysphagia, neck/back pain, bladder dysfunction. She has constipation. She needs a walker to ambulate and denies any falls. She denies any focal symptoms but reports tingling in both hands and weakness in both legs, no pain. She reports sleep is good, and reports good compliance to medications. She reports a history of migraines, but these have resolved.  Epilepsy Risk Factors:  A maternal cousin had seizures when younger. Otherwise she had a normal birth and early development.  There is no history of febrile convulsions, CNS infections such as meningitis/encephalitis, significant traumatic brain injury, neurosurgical procedures.  Prior AEDs: Dilantin (ineffective) Laboratory Data: Phenobarbital level 03/13/16 - 28. BMP showed a creatinine of 1.32, BUN 28; vitamin D level in 09/2015 was 25.9 EEG per [facility] notes: mild  diffuse slowing on routine EEG MRI per [facility] notes: showed evidence of old strokes 1-hour EEG: Occasional right temporal slowing,  frequent right frontotemporal epileptiform discharges  PAST MEDICAL HISTORY: Past Medical History:  Diagnosis Date  . Anemia   . Aortic stenosis   . Arthritis   . Bilateral lower extremity edema   . Bradycardia    a. 01/2013 - asymptomatic.  [name] Chronic diastolic heart failure ([place])   . CKD (chronic kidney disease)    a. baseline CKD stage III  . Coronary artery disease   . GERD (gastroesophageal reflux disease)   . Headache(784.0)   . Heart murmur   . Hyperlipidemia   . Hypertension   . Mobitz (type) I (Wenckebach's) atrioventricular block    a. 01/2013 - asymptomatic.  . Morbid obesity ([place])   . Seizure disorder ([place])   . Seizures ([place])   . TIA (transient ischemic attack) 2014   pt stated she had "mini strokes"    MEDICATIONS: Current Outpatient Prescriptions on File Prior to Visit  Medication Sig Dispense Refill  . alendronate (FOSAMAX) 70 MG tablet TAKE 1 TABLET BY MOUTH ONCE WEEKLY WITH A FULL GLASS OF WATER ON AN EMPTY STOMACH. 12 tablet 2  . amLODipine (NORVASC) 10 MG tablet Take 1 tablet (10 mg total) by mouth daily. 90 tablet 3  . aspirin 81 MG EC tablet Take 81 mg by mouth daily.     . Calcium Carb-Cholecalciferol (CALCIUM 500 +D) 500-400 MG-UNIT TABS Take 1 tablet by mouth 2 (two) times daily. 60 tablet 2  . furosemide (LASIX) 40 MG tablet TAKE 1 TABLET BY MOUTH DAILY 90 tablet 1  . lisinopril (PRINIVIL,ZESTRIL) 40 MG tablet TAKE 1 TABLET(40 MG) BY MOUTH DAILY 90 tablet 3  . PHENobarbital (LUMINAL) 64.8 MG tablet Take 2 tablets (129.6 mg total) by mouth daily. 180 tablet 3  . potassium chloride SA (K-DUR,KLOR-CON) 20 MEQ tablet Take 20 mEq by mouth as directed. Take 1 tablet by mouth with each extra dose of furosemide  0  . pravastatin (PRAVACHOL) 40 MG tablet Take 1 tablet (40 mg total) by mouth daily. 90 tablet 3  . zonisamide (ZONEGRAN) 100 MG capsule Take 400 mg by mouth daily.     No current facility-administered medications on file prior to visit.      ALLERGIES: Allergies  Allergen Reactions  . Ibuprofen Other (See Comments)    Dr advised pt not to take ibuprofen    FAMILY HISTORY: Family History  Problem Relation Age of Onset  . Hypertension Unknown     SOCIAL HISTORY: Social History   Social History  . Marital status: Married    Spouse name: N/A  . Number of children: N/A  . Years of education: N/A   Occupational History  . Not on file.   Social History Main Topics  . Smoking status: Former Smoker    Types: Cigarettes    Quit date: 06/03/2000  . Smokeless tobacco: Never Used  . Alcohol use No  . Drug use: No  . Sexual activity: Not Currently   Other Topics Concern  . Not on file   Social History Narrative  . No narrative on file    REVIEW OF SYSTEMS: Constitutional: No fevers, chills, or sweats, no generalized fatigue, change in appetite Eyes: No visual changes, double vision, eye pain Ear, nose and throat: No hearing loss, ear pain, nasal congestion, sore throat Cardiovascular: No chest pain, palpitations Respiratory:  No shortness of breath at rest or with exertion,  wheezes GastrointestinaI: No nausea, vomiting, diarrhea, abdominal pain, fecal incontinence Genitourinary:  No dysuria, urinary retention or frequency Musculoskeletal:  No neck pain, back pain Integumentary: No rash, pruritus, skin lesions Neurological: as above Psychiatric: No depression, insomnia, anxiety Endocrine: No palpitations, fatigue, diaphoresis, mood swings, change in appetite, change in weight, increased thirst Hematologic/Lymphatic:  No anemia, purpura, petechiae. Allergic/Immunologic: no itchy/runny eyes, nasal congestion, recent allergic reactions, rashes  PHYSICAL EXAM: Vitals:   09/20/16 1108  BP: (!) 106/50  Pulse: (!) 56   General: No acute distress, sitting in wheelchair Head:  Normocephalic/atraumatic Neck: supple, no paraspinal tenderness, full range of motion Heart:  Regular rate and rhythm Lungs:  Clear  to auscultation bilaterally Back: No paraspinal tenderness Skin/Extremities: No rash, no edema Neurological Exam: alert and oriented to person, place, and time. No aphasia or dysarthria. Fund of knowledge is appropriate.  Recent and remote memory are intact.  Attention and concentration are normal.    Able to name objects and repeat phrases. Cranial nerves: Pupils equal, round, reactive to light.  Extraocular movements intact with no nystagmus. Visual fields full. Facial sensation intact. No facial asymmetry. Tongue, uvula, palate midline.  Motor: Bulk and tone normal, muscle strength 5/5 throughout with no pronator drift.  Sensation to light touch intact.  No extinction to double simultaneous stimulation.  Deep tendon reflexes +2 on both UE, unable to elicit in both LE.  Finger to nose testing intact.  Gait hunched with both knees bent (chronic), needs 2-person assist, usually ambulates with walker at home.  IMPRESSION: This is a pleasant 71 yo RH woman with a history of  hypertension, hyperlipidemia, AV block, CAD, and right temporal lobe epilepsy. She reports a sickening feeling, and family has reported episodes of unresponsiveness. No convulsions for >10 years. She is doing well on Phenobarbital 64.8mg  2 tablets daily and increased dose of Zonisamide 400mg  qhs. Continue to monitor symptoms. Her last bone density scan showed osteoporosis, she is on Fosamax. She does not drive. She will follow-up in 6 months and knows to call for any changes  Thank you for allowing me to participate in her care.  Please do not hesitate to call for any questions or concerns.  The duration of this appointment visit was 15 minutes of face-to-face time with the patient.  Greater than 50% of this time was spent in counseling, explanation of diagnosis, planning of further management, and coordination of care.   Ellouise Newer, M.D.   CC: Dr. Posey Pronto

## 2016-09-27 ENCOUNTER — Other Ambulatory Visit: Payer: Self-pay | Admitting: Internal Medicine

## 2016-09-27 DIAGNOSIS — M810 Age-related osteoporosis without current pathological fracture: Secondary | ICD-10-CM

## 2016-10-10 ENCOUNTER — Other Ambulatory Visit: Payer: Self-pay | Admitting: Internal Medicine

## 2016-11-04 ENCOUNTER — Ambulatory Visit: Payer: Medicare Other | Admitting: Cardiovascular Disease

## 2016-12-04 ENCOUNTER — Other Ambulatory Visit: Payer: Self-pay | Admitting: Neurology

## 2016-12-04 ENCOUNTER — Other Ambulatory Visit: Payer: Self-pay

## 2016-12-04 DIAGNOSIS — G40209 Localization-related (focal) (partial) symptomatic epilepsy and epileptic syndromes with complex partial seizures, not intractable, without status epilepticus: Secondary | ICD-10-CM

## 2016-12-04 NOTE — Telephone Encounter (Signed)
PHENobarbital (LUMINAL) 64.8 MG tablet   Refill request @ [facility] on [address]

## 2016-12-05 NOTE — Telephone Encounter (Signed)
Patient has an AWV scheduled in [facility] for Dr. Posey Pronto on 12/09/16 at 10:15 am.

## 2016-12-06 ENCOUNTER — Telehealth: Payer: Self-pay | Admitting: Internal Medicine

## 2016-12-06 DIAGNOSIS — R269 Unspecified abnormalities of gait and mobility: Secondary | ICD-10-CM

## 2016-12-06 NOTE — Telephone Encounter (Signed)
Patient requesting a new Civil engineer, contracting as her's has rusted and is 71 years old.  Please advise.

## 2016-12-06 NOTE — Telephone Encounter (Signed)
That is okay with me. Please let me know what I can assist with on my end.

## 2016-12-09 ENCOUNTER — Ambulatory Visit (INDEPENDENT_AMBULATORY_CARE_PROVIDER_SITE_OTHER): Payer: Medicare Other | Admitting: Internal Medicine

## 2016-12-09 ENCOUNTER — Ambulatory Visit: Payer: Medicare Other

## 2016-12-09 VITALS — BP 162/64 | HR 48 | Temp 97.8°F | Ht 61.0 in | Wt 137.2 lb

## 2016-12-09 DIAGNOSIS — K644 Residual hemorrhoidal skin tags: Secondary | ICD-10-CM

## 2016-12-09 DIAGNOSIS — K5900 Constipation, unspecified: Secondary | ICD-10-CM

## 2016-12-09 DIAGNOSIS — Z9861 Coronary angioplasty status: Secondary | ICD-10-CM

## 2016-12-09 DIAGNOSIS — R001 Bradycardia, unspecified: Secondary | ICD-10-CM | POA: Diagnosis not present

## 2016-12-09 DIAGNOSIS — I350 Nonrheumatic aortic (valve) stenosis: Secondary | ICD-10-CM

## 2016-12-09 DIAGNOSIS — Z23 Encounter for immunization: Secondary | ICD-10-CM | POA: Diagnosis not present

## 2016-12-09 DIAGNOSIS — I130 Hypertensive heart and chronic kidney disease with heart failure and stage 1 through stage 4 chronic kidney disease, or unspecified chronic kidney disease: Secondary | ICD-10-CM

## 2016-12-09 DIAGNOSIS — I2510 Atherosclerotic heart disease of native coronary artery without angina pectoris: Secondary | ICD-10-CM | POA: Diagnosis not present

## 2016-12-09 DIAGNOSIS — R011 Cardiac murmur, unspecified: Secondary | ICD-10-CM

## 2016-12-09 DIAGNOSIS — Z79899 Other long term (current) drug therapy: Secondary | ICD-10-CM

## 2016-12-09 DIAGNOSIS — N183 Chronic kidney disease, stage 3 (moderate): Secondary | ICD-10-CM | POA: Diagnosis not present

## 2016-12-09 DIAGNOSIS — I5032 Chronic diastolic (congestive) heart failure: Secondary | ICD-10-CM

## 2016-12-09 DIAGNOSIS — K648 Other hemorrhoids: Secondary | ICD-10-CM

## 2016-12-09 DIAGNOSIS — Z7982 Long term (current) use of aspirin: Secondary | ICD-10-CM

## 2016-12-09 DIAGNOSIS — Z0000 Encounter for general adult medical examination without abnormal findings: Secondary | ICD-10-CM

## 2016-12-09 DIAGNOSIS — I440 Atrioventricular block, first degree: Secondary | ICD-10-CM

## 2016-12-09 DIAGNOSIS — I10 Essential (primary) hypertension: Secondary | ICD-10-CM

## 2016-12-09 NOTE — Progress Notes (Signed)
CC: HTN  HPI:  [name] is a 71 y.o. female with PMH as listed below including CAD, mod-severe Aortic stenosis, diastolic CHF, 1st degree AV block with bradycardia, HTN, and CKD 3 who presents for follow up management of her HTN.  HTN: She has been taking Lisinopril 40 mg daily, Amlodipine 10 mg daily, and Lasix 40 mg daily. She reports adherence to her medications but says her Amlodipine was filled differently than usual and she has run out of her pills early and has been without it for a couple weeks. She says she is unable to get her refills for another 2 weeks due to insurance limiting an early refill. Her BP this visit is 162/64.  CAD: Patient with moderate to severe three vessel coronary artery disease seen on left and right heart cath in 05/2013. At the time of her cardiac catheterization, possible intervention of CABG and aortic valve replacement was discussed. After a discussion or risks and benefits with cardiology, the decision was made to continue medical management with close monitoring for the development of symptoms. She has been asymptomatic and continues to maintain on medical management with low dose aspirin and statin.  Healthcare Maintenance: Patient's last colonoscopy was on 11/26/2006 which showed small internal and external hemorrhoids. She would be due for repeat colorectal cancer screening now.   Past Medical History:  Diagnosis Date  . Anemia   . Aortic stenosis   . Arthritis   . Bilateral lower extremity edema   . Bradycardia    a. 01/2013 - asymptomatic.  [name] Chronic diastolic heart failure ([place])   . CKD (chronic kidney disease)    a. baseline CKD stage III  . Coronary artery disease   . GERD (gastroesophageal reflux disease)   . Headache(784.0)   . Heart murmur   . Hyperlipidemia   . Hypertension   . Mobitz (type) I (Wenckebach's) atrioventricular block    a. 01/2013 - asymptomatic.  . Morbid obesity ([place])   . Seizure disorder ([place])   .  Seizures ([place])   . TIA (transient ischemic attack) 2014   pt stated she had "mini strokes"   Review of Systems:   Review of Systems  Constitutional: Negative for diaphoresis and malaise/fatigue.  Respiratory: Negative for cough, shortness of breath and wheezing.   Cardiovascular: Negative for chest pain, palpitations and leg swelling.  Gastrointestinal: Positive for constipation. Negative for abdominal pain, blood in stool and melena.  Genitourinary: Negative for dysuria and frequency.  Musculoskeletal: Negative for falls.  Neurological: Negative for dizziness, loss of consciousness and weakness.     Physical Exam:  Vitals:   12/09/16 0951  BP: (!) 162/64  Pulse: (!) 48  Temp: 97.8 F (36.6 C)  TempSrc: Oral  SpO2: 100%  Weight: 137 lb 3.2 oz (62.2 kg)  Height: 5\' 1"  (1.549 m)   Physical Exam  Constitutional: She is oriented to person, place, and time. She appears well-developed. No distress.  Resting in wheelchair.  HENT:  Head: Normocephalic and atraumatic.  Cardiovascular:  Bradycardic, 3/6 systolic murmur loudest RUS border with radiation to the carotids. Radial pulses equal bilaterally.  Pulmonary/Chest: Effort normal. No respiratory distress. She has no wheezes. She has no rales.  Musculoskeletal: She exhibits no edema or tenderness.  Compression socks in place.  Neurological: She is alert and oriented to person, place, and time.  Skin: Skin is warm.  Psychiatric: She has a normal mood and affect.     Assessment & Plan:   See Encounters  Tab for problem based charting.  Patient discussed with Dr. Eppie Gibson  Coronary artery disease Currently on medical management alone while asymptomatic after risks/benefits of surgical intervention (CABG plus AVR) were discussed with cardiology. Will continue low dose Aspirin 81 mg daily and Pravastatin 40 mg daily. Can consider changing to high-intensity Atorvastatin or Rosuvastatin as her Pravastatin is moderate-intensity. Low  threshold to have her seen emergently for any anginal symptoms.  Essential hypertension BP Readings from Last 3 Encounters:  12/09/16 (!) 162/64  09/20/16 (!) 106/50  07/29/16 130/70   Her BP is elevated this visit in the setting of running out of her Amlodipine. We have provided a short supply of Amlodipine until she is able to get her regular refill. Will continue current management and monitor BP on follow up. - Continue Lisinopril 40 mg daily - Continue Amlodipine 10 mg daily; short supply given this visit - Appreciate pharmacy assistance  Health care maintenance She is due for repeat colorectal cancer screening as she is <16 years old. We discussed the risks and benefits of screening vs not screening given her underlying cardiac disease and options for FIT testing or colonoscopy. She has opted for FIT testing at this time. We did discuss that if this returns positive, we would have to strongly consider repeat colonoscopy with the possibility that her known 3 vessel CAD and moderate-severe aortic stenosis may need to be addressed prior. She understands and agrees. - FIT testing for annual colorectal cancer screening  Need for immunization against influenza Flu shot given this visit.

## 2016-12-09 NOTE — Assessment & Plan Note (Addendum)
Currently on medical management alone while asymptomatic after risks/benefits of surgical intervention (CABG plus AVR) were discussed with cardiology. Will continue low dose Aspirin 81 mg daily and Pravastatin 40 mg daily. Can consider changing to high-intensity Atorvastatin or Rosuvastatin as her Pravastatin is moderate-intensity. Low threshold to have her seen emergently for any anginal symptoms.

## 2016-12-09 NOTE — Patient Instructions (Addendum)
It was a pleasure to see you Sherry [name].  We will give some samples of your Amlodipine until you are able to refill it.  We are giving you a stool card for colorectal cancer screening today.  We are giving you the flu shot today.  Try fiber supplements for constipation.  Please follow up with me in about 3 months or sooner if needed.   Fiber Content in Foods See the following list for the dietary fiber content of some common foods. High-fiber foods High-fiber foods contain 4 grams or more (4g or more) of fiber per serving. They include:  Artichoke (fresh) - 1 medium has 10.3g of fiber.  Baked beans, plain or vegetarian (canned) -  cup has 5.2g of fiber.  Blackberries or raspberries (fresh) -  cup has 4g of fiber.  Bran cereal -  cup has 8.6g of fiber.  Bulgur (cooked) -  cup has 4g of fiber.  Kidney beans (canned) -  cup has 6.8g of fiber.  Lentils (cooked) -  cup has 7.8g of fiber.  Pear (fresh) - 1 medium has 5.1g of fiber.  Peas (frozen) -  cup has 4.4g of fiber.  Pinto beans (canned) -  cup has 5.5g of fiber.  Pinto beans (dried and cooked) -  cup has 7.7g of fiber.  Potato with skin (baked) - 1 medium has 4.4g of fiber.  Quinoa (cooked) -  cup has 5g of fiber.  Soybeans (canned, frozen, or fresh) -  cup has 5.1g of fiber.  Moderate-fiber foods Moderate-fiber foods contain 1-4 grams (1-4g) of fiber per serving. They include:  Almonds - 1 oz. has 3.5g of fiber.  Apple with skin - 1 medium has 3.3g of fiber.  Applesauce, sweetened -  cup has 1.5g of fiber.  Bagel, plain - one 4-inch (10-cm) bagel has 2g of fiber.  Banana - 1 medium has 3.1g of fiber.  Broccoli (cooked) -  cup has 2.5g of fiber.  Carrots (cooked) -  cup has 2.3g of fiber.  Corn (canned or frozen) -  cup has 2.1g of fiber.  Corn tortilla - one 6-inch (15-cm) tortilla has 1.5g of fiber.  Green beans (canned) -  cup has 2g of fiber.  Instant oatmeal -  cup has  about 2g of fiber.  Long-grain brown rice (cooked) - 1 cup has 3.5g of fiber.  Macaroni, enriched (cooked) - 1 cup has 2.5g of fiber.  Melon - 1 cup has 1.4g of fiber.  Multigrain cereal -  cup has about 2-4g of fiber.  Orange - 1 small has 3.1g of fiber.  Potatoes, mashed -  cup has 1.6g of fiber.  Raisins - 1/4 cup has 1.6g of fiber.  Squash -  cup has 2.9g of fiber.  Sunflower seeds -  cup has 1.1g of fiber.  Tomato - 1 medium has 1.5g of fiber.  Vegetable or soy patty - 1 has 3.4g of fiber.  Whole-wheat bread - 1 slice has 2g of fiber.  Whole-wheat spaghetti -  cup has 3.2g of fiber.  Low-fiber foods Low-fiber foods contain less than 1 gram (less than 1g) of fiber per serving. They include:  Egg - 1 large.  Flour tortilla - one 6-inch (15-cm) tortilla.  Fruit juice -  cup.  Lettuce - 1 cup.  Meat, poultry, or fish - 1 oz.  Milk - 1 cup.  Spinach (raw) - 1 cup.  White bread - 1 slice.  White rice -  cup.  Yogurt -  cup.  Actual amounts of fiber in foods may be different depending on processing. Talk with your dietitian about how much fiber you need in your diet. This information is not intended to replace advice given to you by your health care provider. Make sure you discuss any questions you have with your health care provider. Document Released: 06/23/2006 Document Revised: 07/13/2015 Document Reviewed: 03/30/2015 Elsevier Interactive Patient Education  2018 [employer].

## 2016-12-10 DIAGNOSIS — Z23 Encounter for immunization: Secondary | ICD-10-CM | POA: Insufficient documentation

## 2016-12-10 NOTE — Assessment & Plan Note (Signed)
She is due for repeat colorectal cancer screening as she is <70 years old. We discussed the risks and benefits of screening vs not screening given her underlying cardiac disease and options for FIT testing or colonoscopy. She has opted for FIT testing at this time. We did discuss that if this returns positive, we would have to strongly consider repeat colonoscopy with the possibility that her known 3 vessel CAD and moderate-severe aortic stenosis may need to be addressed prior. She understands and agrees. - FIT testing for annual colorectal cancer screening

## 2016-12-10 NOTE — Assessment & Plan Note (Signed)
BP Readings from Last 3 Encounters:  12/09/16 (!) 162/64  09/20/16 (!) 106/50  07/29/16 130/70   Her BP is elevated this visit in the setting of running out of her Amlodipine. We have provided a short supply of Amlodipine until she is able to get her regular refill. Will continue current management and monitor BP on follow up. - Continue Lisinopril 40 mg daily - Continue Amlodipine 10 mg daily; short supply given this visit - [place] assistance

## 2016-12-10 NOTE — Assessment & Plan Note (Signed)
Flu shot given this visit. 

## 2016-12-11 NOTE — Telephone Encounter (Signed)
Thanks!  Please place a new DME Order for a Shower Chair and I will send it to [facility].

## 2016-12-19 ENCOUNTER — Other Ambulatory Visit: Payer: Self-pay | Admitting: Internal Medicine

## 2016-12-19 DIAGNOSIS — I5032 Chronic diastolic (congestive) heart failure: Secondary | ICD-10-CM

## 2016-12-20 NOTE — Telephone Encounter (Signed)
furosemide (LASIX) 40 MG tablet, REFILL REQUEST. 

## 2016-12-23 NOTE — Progress Notes (Signed)
Case discussed with Dr. Patel at the time of the visit.  We reviewed the resident's history and exam and pertinent patient test results.  I agree with the assessment, diagnosis and plan of care documented in the resident's note. 

## 2016-12-27 NOTE — Telephone Encounter (Signed)
DME Order has been faxed to [facility] @ [phone].  Please allow 48 hrs for turn around time to process the ins.  The patient can call [phone] if any questions about delivery or pick up.

## 2017-02-02 ENCOUNTER — Other Ambulatory Visit: Payer: Self-pay | Admitting: Internal Medicine

## 2017-02-02 DIAGNOSIS — I10 Essential (primary) hypertension: Secondary | ICD-10-CM

## 2017-02-05 DIAGNOSIS — R600 Localized edema: Secondary | ICD-10-CM | POA: Insufficient documentation

## 2017-02-05 DIAGNOSIS — M1990 Unspecified osteoarthritis, unspecified site: Secondary | ICD-10-CM | POA: Insufficient documentation

## 2017-02-05 DIAGNOSIS — K219 Gastro-esophageal reflux disease without esophagitis: Secondary | ICD-10-CM | POA: Insufficient documentation

## 2017-02-05 DIAGNOSIS — I441 Atrioventricular block, second degree: Secondary | ICD-10-CM | POA: Insufficient documentation

## 2017-02-05 DIAGNOSIS — D649 Anemia, unspecified: Secondary | ICD-10-CM | POA: Insufficient documentation

## 2017-02-05 DIAGNOSIS — E785 Hyperlipidemia, unspecified: Secondary | ICD-10-CM | POA: Insufficient documentation

## 2017-02-05 DIAGNOSIS — R569 Unspecified convulsions: Secondary | ICD-10-CM | POA: Insufficient documentation

## 2017-02-14 ENCOUNTER — Ambulatory Visit (HOSPITAL_COMMUNITY): Payer: Medicare Other | Attending: Internal Medicine

## 2017-02-14 ENCOUNTER — Other Ambulatory Visit: Payer: Self-pay

## 2017-02-14 DIAGNOSIS — I10 Essential (primary) hypertension: Secondary | ICD-10-CM

## 2017-02-14 DIAGNOSIS — I350 Nonrheumatic aortic (valve) stenosis: Secondary | ICD-10-CM | POA: Diagnosis not present

## 2017-02-14 DIAGNOSIS — I110 Hypertensive heart disease with heart failure: Secondary | ICD-10-CM | POA: Insufficient documentation

## 2017-02-14 DIAGNOSIS — I5032 Chronic diastolic (congestive) heart failure: Secondary | ICD-10-CM | POA: Diagnosis not present

## 2017-02-20 ENCOUNTER — Ambulatory Visit: Payer: Medicare Other | Admitting: Cardiovascular Disease

## 2017-02-20 VITALS — BP 128/72 | HR 48 | Ht 61.0 in | Wt 132.6 lb

## 2017-02-20 DIAGNOSIS — I2510 Atherosclerotic heart disease of native coronary artery without angina pectoris: Secondary | ICD-10-CM | POA: Diagnosis not present

## 2017-02-20 DIAGNOSIS — I517 Cardiomegaly: Secondary | ICD-10-CM

## 2017-02-20 DIAGNOSIS — I350 Nonrheumatic aortic (valve) stenosis: Secondary | ICD-10-CM | POA: Diagnosis not present

## 2017-02-20 NOTE — Patient Instructions (Signed)

## 2017-02-20 NOTE — Progress Notes (Signed)
Cardiology Office Note Date:  02/22/2017   ID:  Richie, Vadala 12/20/1945, MRN [id number]  PCP:  Zada Finders, MD  Cardiologist:  Sherren Mocha, MD    Chief Complaint  Patient presents with  . Follow-up    aortic stenosis     History of Present Illness: Sherry [name] is a 72 y.o. female who presents for follow-up of chronic diastolic heart failure, aortic stenosis, and CAD.  The patient has been evaluated for multivessel coronary artery disease and aortic stenosis in the past.  After multidisciplinary review we felt it best to treat her medically.  She has had remarkably minimal symptoms over time.  She has a history of stroke and resultant very poor functional capacity.  She is here in a motorized scooter today.  She is lost a lot of weight since I last saw her.  Dates that much of this is been related to her stress level.  She has lost several family members from medical illnesses.  She specifically denies chest pain, shortness of breath, leg swelling, lightheadedness, or syncope.  States that she is still able to do her activities of daily living and light household work.    Past Medical History:  Diagnosis Date  . Anemia   . Aortic stenosis   . Arthritis   . Bilateral lower extremity edema   . Bradycardia    a. 01/2013 - asymptomatic.  [name] Chronic diastolic heart failure ([place])   . CKD (chronic kidney disease)    a. baseline CKD stage III  . Coronary artery disease   . GERD (gastroesophageal reflux disease)   . Headache(784.0)   . Heart murmur   . Hyperlipidemia   . Hypertension   . Mobitz (type) I (Wenckebach's) atrioventricular block    a. 01/2013 - asymptomatic.  . Morbid obesity ([place])   . Seizure disorder ([place])   . Seizures ([place])   . TIA (transient ischemic attack) 2014   pt stated she had "mini strokes"    Past Surgical History:  Procedure Laterality Date  . ABDOMINAL HYSTERECTOMY    . LEFT AND RIGHT HEART CATHETERIZATION WITH CORONARY ANGIOGRAM  N/A 05/24/2013   Procedure: LEFT AND RIGHT HEART CATHETERIZATION WITH CORONARY ANGIOGRAM;  Surgeon: Sinclair Grooms, MD;  Location: [facility] CATH LAB;  Service: Cardiovascular;  Laterality: N/A;    Current Outpatient Medications  Medication Sig Dispense Refill  . alendronate (FOSAMAX) 70 MG tablet TAKE 1 TABLET BY MOUTH ONCE WEEKLY WITH A FULL GLASS OF WATER ON AN EMPTY STOMACH. 12 tablet 3  . amLODipine (NORVASC) 10 MG tablet TAKE 1 TABLET BY MOUTH DAILY 90 tablet 3  . aspirin 81 MG EC tablet Take 81 mg by mouth daily.     . Calcium Carb-Cholecalciferol (CALCIUM 500 +D) 500-400 MG-UNIT TABS Take 1 tablet by mouth 2 (two) times daily. 60 tablet 2  . furosemide (LASIX) 40 MG tablet TAKE 1 TABLET BY MOUTH DAILY 90 tablet 0  . lisinopril (PRINIVIL,ZESTRIL) 40 MG tablet TAKE 1 TABLET BY MOUTH EVERY DAY 90 tablet 3  . PHENobarbital (LUMINAL) 64.8 MG tablet TAKE 2 TABLETS BY MOUTH EVERY DAY. 180 tablet 1  . potassium chloride SA (K-DUR,KLOR-CON) 20 MEQ tablet Take 20 mEq by mouth as directed. Take 1 tablet by mouth with each extra dose of furosemide  0  . pravastatin (PRAVACHOL) 40 MG tablet Take 1 tablet (40 mg total) by mouth daily. 90 tablet 3  . zonisamide (ZONEGRAN) 100 MG capsule Take 4 capsules (400 mg  total) by mouth daily. 360 capsule 3   No current facility-administered medications for this visit.     Allergies:   Ibuprofen   Social History:  The patient  reports that she quit smoking about 16 years ago. Her smoking use included cigarettes. she has never used smokeless tobacco. She reports that she does not drink alcohol or use drugs.   Family History:  The patient's family history includes Hypertension in her unknown relative.    ROS:  Please see the history of present illness.  Otherwise, review of systems is positive for weight loss, easy bruising, leg pain.  All other systems are reviewed and negative.    PHYSICAL EXAM: VS:  BP 128/72   Pulse (!) 48   Ht 5\' 1"  (1.549 m)   Wt 132  lb 9.6 oz (60.1 kg)   BMI 25.05 kg/m  , BMI Body mass index is 25.05 kg/m. GEN: Well nourished, well developed, in no acute distress  HEENT: normal  Neck: no JVD, no masses. No carotid bruits Cardiac: RRR with 3/6 harsh systolic murmur, mid peaking, at the right upper sternal border               Respiratory:  clear to auscultation bilaterally, normal work of breathing GI: soft, nontender, nondistended, + BS MS: no deformity or atrophy  Ext: no pretibial edema Skin: warm and dry, no rash Neuro:  Strength and sensation are intact Psych: euthymic mood, full affect  EKG:  EKG is ordered today. The ekg ordered today shows sinus bradycardia 48 bpm, second-degree type I AV block, LVH with repolarization abnormality.  Recent Labs: 03/13/2016: BUN 28; Creatinine, Ser 1.32; Potassium 4.2; Sodium 140   Lipid Panel     Component Value Date/Time   CHOL 174 01/16/2015 1040   TRIG 58 01/16/2015 1040   HDL 76 01/16/2015 1040   CHOLHDL 2.3 01/16/2015 1040   VLDL 12 01/16/2015 1040   LDLCALC 86 01/16/2015 1040      Wt Readings from Last 3 Encounters:  02/20/17 132 lb 9.6 oz (60.1 kg)  12/09/16 137 lb 3.2 oz (62.2 kg)  09/20/16 135 lb (61.2 kg)     Cardiac Studies Reviewed: 2D Echo 02-14-2017: Study Conclusions  - Left ventricle: Small LV cavity. Severely thickened LV walls-   probably infiltrative or hypertrophic cardiomyopathy. The   estimated ejection fraction was 85%. Doppler parameters are   consistent with abnormal left ventricular relaxation (grade 1   diastolic dysfunction). The E/e&' ratio is >20, suggesting   markedly elevated LV filling pressure. - Aortic valve: Calcified with leaflet restriction. Moderate   stenosis. Mean gradient (S): 25 mm Hg. Peak gradient (S): 58 mm   Hg. Valve area (VTI): 1.27 cm^2. Valve area (Vmax): 1.06 cm^2.   Valve area (Vmean): 1.27 cm^2. - Mitral valve: Calcified annulus. Mildly thickened leaflets . - Left atrium: Moderately dilated. -  Right atrium: The atrium was mildly dilated. Mild diastolic   collapse is noted. - Tricuspid valve: There was trivial regurgitation. - Pulmonary arteries: PA peak pressure: 22 mm Hg (S). - Inferior vena cava: The vessel was normal in size. The   respirophasic diameter changes were in the normal range (>= 50%),   consistent with normal central venous pressure. - Pericardium, extracardiac: Small pericardial effusion. Features   were not consistent with tamponade physiology.  Impressions:  - Compared to a prior study in 02/2016, the LVEF is unchanged. The   LV walls now measure thicker. LV filling pressure is very  high.   The pericardial effusion is now small, but tamponade physiology   is not present.   ASSESSMENT AND PLAN: 1.  Aortic stenosis, moderate: By Doppler criteria the patient has moderate aortic stenosis.  Her valve is severely calcified and restricted.  She has marked LVH and may be a patient who has paradoxical low flow, low gradient, severe aortic stenosis.  She continues to have remarkably minimal symptoms.  We will continue to watch her closely with serial follow-up.  2.  Coronary artery disease, multivessel: No anginal symptoms.  Continue medical management.  She is treated with an ACE inhibitor, calcium blocker, statin drug, and aspirin.  She is not a candidate for a beta-blocker because of AV block and bradycardia.  3.  Chronic diastolic heart failure: The patient's echo study and images are reviewed.  She has very marked ventricular hypertrophy, small pericardial effusion, and Doppler findings suggestive of diastolic dysfunction.  The echo appearance is that of a possible infiltrative or restrictive cardiomyopathy.  I will review her case with heart failure colleagues, but doubt there is a whole lot to offer from a therapeutic standpoint in this patient with fairly poor overall functional capacity.  For now we will continue with her current medical therapy.  Current  medicines are reviewed with the patient today.  The patient does not have concerns regarding medicines.  Labs/ tests ordered today include:   Orders Placed This Encounter  Procedures  . EKG 12-Lead    Disposition:   FU 6 months  Signed, Sherren Mocha, MD  02/22/2017 4:10 PM    [facility] [address], [place], [place]  [address] Phone: [phone]; Fax: [phone]

## 2017-02-22 ENCOUNTER — Encounter: Payer: Self-pay | Admitting: Cardiovascular Disease

## 2017-02-23 NOTE — Progress Notes (Signed)
Think she needs amyloidosis workup.  Needs cardiac MRI to look for infiltrative disease.  Would send SPEP/urine IFE and if this is negative and MRI concerning, could consider PYP scan.  We would be glad to see her.

## 2017-03-03 ENCOUNTER — Other Ambulatory Visit (HOSPITAL_COMMUNITY): Payer: Medicare Other

## 2017-03-05 ENCOUNTER — Other Ambulatory Visit: Payer: Self-pay

## 2017-03-05 DIAGNOSIS — I5032 Chronic diastolic (congestive) heart failure: Secondary | ICD-10-CM

## 2017-03-05 NOTE — Telephone Encounter (Signed)
furosemide (LASIX) 40 MG tablet, Refill request @ [facility] on [address].

## 2017-03-06 MED ORDER — FUROSEMIDE 40 MG PO TABS: 40.0000 mg | ORAL_TABLET | Freq: Every day | ORAL | 1 refills | Status: DC

## 2017-03-19 ENCOUNTER — Encounter: Payer: Self-pay | Admitting: Internal Medicine

## 2017-03-19 ENCOUNTER — Ambulatory Visit (INDEPENDENT_AMBULATORY_CARE_PROVIDER_SITE_OTHER): Payer: Medicare Other | Admitting: Internal Medicine

## 2017-03-19 ENCOUNTER — Other Ambulatory Visit: Payer: Self-pay

## 2017-03-19 VITALS — BP 137/56 | HR 45 | Temp 97.5°F | Ht 61.0 in | Wt 137.5 lb

## 2017-03-19 DIAGNOSIS — R001 Bradycardia, unspecified: Secondary | ICD-10-CM

## 2017-03-19 DIAGNOSIS — I440 Atrioventricular block, first degree: Secondary | ICD-10-CM

## 2017-03-19 DIAGNOSIS — I2510 Atherosclerotic heart disease of native coronary artery without angina pectoris: Secondary | ICD-10-CM

## 2017-03-19 DIAGNOSIS — R011 Cardiac murmur, unspecified: Secondary | ICD-10-CM | POA: Diagnosis not present

## 2017-03-19 DIAGNOSIS — I350 Nonrheumatic aortic (valve) stenosis: Secondary | ICD-10-CM

## 2017-03-19 DIAGNOSIS — I700 Atherosclerosis of aorta: Secondary | ICD-10-CM

## 2017-03-19 DIAGNOSIS — I10 Essential (primary) hypertension: Secondary | ICD-10-CM | POA: Diagnosis not present

## 2017-03-19 DIAGNOSIS — Z7982 Long term (current) use of aspirin: Secondary | ICD-10-CM

## 2017-03-19 DIAGNOSIS — N183 Chronic kidney disease, stage 3 unspecified: Secondary | ICD-10-CM

## 2017-03-19 DIAGNOSIS — Z7983 Long term (current) use of bisphosphonates: Secondary | ICD-10-CM

## 2017-03-19 DIAGNOSIS — I5030 Unspecified diastolic (congestive) heart failure: Secondary | ICD-10-CM

## 2017-03-19 DIAGNOSIS — Z79899 Other long term (current) drug therapy: Secondary | ICD-10-CM

## 2017-03-19 DIAGNOSIS — I5032 Chronic diastolic (congestive) heart failure: Secondary | ICD-10-CM

## 2017-03-19 DIAGNOSIS — G40909 Epilepsy, unspecified, not intractable, without status epilepticus: Secondary | ICD-10-CM

## 2017-03-19 DIAGNOSIS — I130 Hypertensive heart and chronic kidney disease with heart failure and stage 1 through stage 4 chronic kidney disease, or unspecified chronic kidney disease: Secondary | ICD-10-CM

## 2017-03-19 DIAGNOSIS — M810 Age-related osteoporosis without current pathological fracture: Secondary | ICD-10-CM | POA: Diagnosis not present

## 2017-03-19 NOTE — Progress Notes (Signed)
Internal Medicine Clinic Attending  Case discussed with Dr. Patel  at the time of the visit.  We reviewed the resident's history and exam and pertinent patient test results.  I agree with the assessment, diagnosis, and plan of care documented in the resident's note.  Alexander N Raines, MD   

## 2017-03-19 NOTE — Assessment & Plan Note (Signed)
Sherry [name] has moderate to severe aortic stenosis and repeat TTE in December 2018 showed associated significant left ventricular hypertrophy. She follows with cardiology and cardiothoracic surgery. She continues to deny any lightheadedness, dizziness, syncope, or falls. Her physical mobility is limited due to bilateral knee OA and mostly relies on a wheelchair for transport. A/P: Her moderate-severe aortic stenosis is stable and she continues to remain asymptomatic. Recommend continued monitoring and follow up with cardiology and cardiothoracic surgery as scheduled.

## 2017-03-19 NOTE — Progress Notes (Signed)
CC: HTN  HPI:  Sherry [name] is a 72 y.o. female with PMH as listed below including CAD, moderate-severe Aortic Stenosis, dCHF, 1st degree AV block with bradycardia, HTN, CKD3, Osteoporosis, and Seizure disorder who presents for follow up management of HTN.  Essential hypertension She is currently prescribed Lisinopril 40 mg daily and Amlodipine 10 mg daily in addition to Lasix 40 mg daily. She reports adherence without side effect. BP Readings from Last 3 Encounters:  03/19/17 (!) 137/56  02/20/17 128/72  12/09/16 (!) 162/64  A/P: BP is stable and well-controlled in the setting of mod-severe Aortic Stenosis. Will continue current management and repeat BMET today.  Aortic stenosis Sherry [name] has moderate to severe aortic stenosis and repeat TTE in December 2018 showed associated significant left ventricular hypertrophy. She follows with cardiology and cardiothoracic surgery. She continues to deny any lightheadedness, dizziness, syncope, or falls. Her physical mobility is limited due to bilateral knee OA and mostly relies on a wheelchair for transport. A/P: Her moderate-severe aortic stenosis is stable and she continues to remain asymptomatic. Recommend continued monitoring and follow up with cardiology and cardiothoracic surgery as scheduled.  Coronary artery disease She has moderate to severe 3V CAD on medical management alone. She denies any recent chest pain.  A/P: Currently stable without anginal symptoms. She is on medical management alone while asymptomatic as risks of CABG plus AVR may be greater than benefits at this time (or in the future). She is on pravastatin and low dose aspirin. She is not a candidate for beta blocker given her 1st degree AV block and bradycardia.  Thoracic aortic atherosclerosis (HCC) Atherosclerosis of thoracic aorta seen on CXR in January 2015. She is currently on Pravastatin 40 mg daily which we will continue.  Epilepsy [facility]) She has a  prior history of seizures and is currently following with Neurology. She is on Phenobarbital and Zonisamide. She denies any recent seizures. A/P: Currently stable. She will continue her current medications and follow up with Neurology as scheduled on 04/01/17.  Osteoporosis DEXA in 10/2015 showed a T-score of -2.8 at the right femur neck. She was started on Fosamax 70 mg weekly which she is tolerating well. A/P: Currently stable without recent falls or fractures. Will continue Foxamax 70 mg daily. Would consider repeat DEXA and discontinuing bisphosphonate at 5 year mark (10/2020).  CKD (chronic kidney disease) stage 3, GFR 30-59 ml/min Her baseline creatinine is between 1.2-1.6. She reports good urine output and denies any dysuria. A/P: Will repeat BMET to monitor renal function.    Past Medical History:  Diagnosis Date  . Anemia   . Aortic stenosis   . Arthritis   . Bilateral lower extremity edema   . Bradycardia    a. 01/2013 - asymptomatic.  [name] Chronic diastolic heart failure ([place])   . CKD (chronic kidney disease)    a. baseline CKD stage III  . Coronary artery disease   . GERD (gastroesophageal reflux disease)   . Headache(784.0)   . Heart murmur   . Hyperlipidemia   . Hypertension   . Mobitz (type) I (Wenckebach's) atrioventricular block    a. 01/2013 - asymptomatic.  . Morbid obesity ([place])   . Seizure disorder ([place])   . Seizures ([place])   . TIA (transient ischemic attack) 2014   pt stated she had "mini strokes"   Review of Systems:   Review of Systems  Respiratory: Negative for shortness of breath.   Cardiovascular: Negative for chest pain, palpitations and leg swelling.  Genitourinary: Negative for dysuria.  Musculoskeletal: Positive for joint pain. Negative for falls.  Neurological: Negative for dizziness, focal weakness and loss of consciousness.     Physical Exam:  Vitals:   03/19/17 1357  BP: (!) 137/56  Pulse: (!) 45  Temp: (!) 97.5 F (36.4 C)    TempSrc: Oral  SpO2: 100%  Weight: 137 lb 8 oz (62.4 kg)  Height: 5\' 1"  (1.549 m)   Physical Exam  Constitutional: She is oriented to person, place, and time. No distress.  Resting in wheelchair.  HENT:  Head: Normocephalic and atraumatic.  Cardiovascular:  Bradycardic, 3/6 systolic murmur loudest at RUS border with radiation to carotids  Pulmonary/Chest: Effort normal. No respiratory distress. She has no wheezes. She has no rales.  Musculoskeletal: She exhibits no edema.  Wearing Compression socks  Neurological: She is alert and oriented to person, place, and time.  Skin: Skin is warm. She is not diaphoretic.  Psychiatric: She has a normal mood and affect.    Assessment & Plan:   See Encounters Tab for problem based charting.  Patient discussed with Dr. Rebeca Alert

## 2017-03-19 NOTE — Assessment & Plan Note (Signed)
She is currently prescribed Lisinopril 40 mg daily and Amlodipine 10 mg daily in addition to Lasix 40 mg daily. She reports adherence without side effect. BP Readings from Last 3 Encounters:  03/19/17 (!) 137/56  02/20/17 128/72  12/09/16 (!) 162/64  A/P: BP is stable and well-controlled in the setting of mod-severe Aortic Stenosis. Will continue current management and repeat BMET today.

## 2017-03-19 NOTE — Patient Instructions (Addendum)
It was a pleasure to see you again Sherry [name].  Please continue your medications as prescribed.  We are checking routine blood work today.  I have given you information about [facility].  Please follow up with [place] again in 6 months or sooner if needed.

## 2017-03-19 NOTE — Assessment & Plan Note (Addendum)
Her baseline creatinine is between 1.2-1.6. She reports good urine output and denies any dysuria. A/P: Will repeat BMET to monitor renal function.  ADDENDUM 03/21/17: BUN/Cr increased from prior at 42/1.62. Will decrease her furosemide from 40 mg daily to 20 mg daily and repeat BMET in 2-3 weeks for a recheck.

## 2017-03-19 NOTE — Assessment & Plan Note (Signed)
Atherosclerosis of thoracic aorta seen on CXR in January 2015. She is currently on Pravastatin 40 mg daily which we will continue.

## 2017-03-19 NOTE — Assessment & Plan Note (Signed)
She has moderate to severe 3V CAD on medical management alone. She denies any recent chest pain.  A/P: Currently stable without anginal symptoms. She is on medical management alone while asymptomatic as risks of CABG plus AVR may be greater than benefits at this time (or in the future). She is on pravastatin and low dose aspirin. She is not a candidate for beta blocker given her 1st degree AV block and bradycardia.

## 2017-03-19 NOTE — Assessment & Plan Note (Signed)
She has a prior history of seizures and is currently following with Neurology. She is on Phenobarbital and Zonisamide. She denies any recent seizures. A/P: Currently stable. She will continue her current medications and follow up with Neurology as scheduled on 04/01/17.

## 2017-03-19 NOTE — Assessment & Plan Note (Signed)
DEXA in 10/2015 showed a T-score of -2.8 at the right femur neck. She was started on Fosamax 70 mg weekly which she is tolerating well. A/P: Currently stable without recent falls or fractures. Will continue Foxamax 70 mg daily. Would consider repeat DEXA and discontinuing bisphosphonate at 5 year mark (10/2020).

## 2017-03-20 LAB — BMP8+ANION GAP
Anion Gap: 18 mmol/L (ref 10.0–18.0)
BUN/Creatinine Ratio: 26 (ref 12–28)
BUN: 42 mg/dL — ABNORMAL HIGH (ref 8–27)
CO2: 17 mmol/L — ABNORMAL LOW (ref 20–29)
Calcium: 8.5 mg/dL — ABNORMAL LOW (ref 8.7–10.3)
Chloride: 107 mmol/L — ABNORMAL HIGH (ref 96–106)
Creatinine, Ser: 1.62 mg/dL — ABNORMAL HIGH (ref 0.57–1.00)
GFR calc Af Amer: 37 mL/min/{1.73_m2} — ABNORMAL LOW (ref >59)
GFR calc non Af Amer: 32 mL/min/{1.73_m2} — ABNORMAL LOW (ref >59)
Glucose: 81 mg/dL (ref 65–99)
Potassium: 4.8 mmol/L (ref 3.5–5.2)
Sodium: 142 mmol/L (ref 134–144)

## 2017-03-21 MED ORDER — FUROSEMIDE 40 MG PO TABS: 20.0000 mg | ORAL_TABLET | Freq: Every day | ORAL | 1 refills | Status: DC

## 2017-03-21 NOTE — Addendum Note (Signed)
Addended by: Lenore Cordia on: 03/21/2017 06:38 AM   Modules accepted: Orders

## 2017-04-01 ENCOUNTER — Encounter: Payer: Self-pay | Admitting: Neurology

## 2017-04-01 ENCOUNTER — Other Ambulatory Visit: Payer: Self-pay

## 2017-04-01 ENCOUNTER — Ambulatory Visit: Payer: Medicare HMO | Admitting: Neurology

## 2017-04-01 VITALS — BP 148/62 | HR 59 | Ht 61.0 in | Wt 137.0 lb

## 2017-04-01 DIAGNOSIS — G40209 Localization-related (focal) (partial) symptomatic epilepsy and epileptic syndromes with complex partial seizures, not intractable, without status epilepticus: Secondary | ICD-10-CM | POA: Diagnosis not present

## 2017-04-01 MED ORDER — PHENOBARBITAL 64.8 MG PO TABS: 129.6000 mg | ORAL_TABLET | Freq: Every day | ORAL | 3 refills | Status: DC

## 2017-04-01 MED ORDER — ZONISAMIDE 100 MG PO CAPS: 400.0000 mg | ORAL_CAPSULE | Freq: Every day | ORAL | 3 refills | Status: DC

## 2017-04-01 NOTE — Progress Notes (Signed)
NEUROLOGY FOLLOW UP OFFICE NOTE  Sherry [name] [id number] 1945-06-10  HISTORY OF PRESENT ILLNESS: I had the pleasure of seeing Sherry [name] in follow-up in the neurology clinic on 04/01/2017.  The patient was last seen 6 months ago for right temporal lobe epilepsy. She is again accompanied by her husband who helps supplement the history today.  Since her last visit, she and her husband deny any further episodes of gaps in time and epigastric sensation for a year, since Zonisamide dose was increased to 400mg  qhs. She is also taking Phenobarbital 64.8mg  2 tabs daily. No side effects on medications. She and her husband deny any staring/unresponsive episodes, gaps in time, olfactory/gustatory hallucinations, focal numbness/tingling/weakness, myoclonic jerks. She denies any headaches, dizziness, vision changes, no falls. She has a motorized wheelchair. She is reporting pain in her hands from arthritis.   HPI 05/20/2016: This is a pleasant 72 yo RH woman with a history of hypertension, hyperlipidemia, AV block, CAD, and seizures. Records from [facility] were reviewed. She started having seizures in the 72s (age 16s), initially she was having generalized convulsions often preceded by a sensation of severe nausea, "sick to stomach," followed by eyes rolling up, shaking of both arms and legs with tongue bite. They were initially occurring every 2 weeks while on Dilantin, none since adding Phenobarbital. They report the last GTC was probably in her 16s (>10 years ago). Family would also note episodes of unresponsiveness where her head drops forward slightly and she does not respond. She would be amnestic of these and feel like she woke up from sleep. Her daughter previously reported she seemed confused afterwards. There is note that these resolved after phenobarbital reduction in 2013, however her husband reports occasional episodes where he would be talking to her, then he comes back in the room and she  would look at him like they did not talk earlier, she would not understand and say "huh?" and he asks if she heard him. He last noticed this around 1.5 months ago, prior to that was 7-8 months ago. She continues to have the sensation of a sickening feeling every few months or so, these always seem to occur when she is in the bath. She had one 2 weeks ago, these last up to a minute, she denies any confusion or focal symptoms. She denies any olfactory/gustatory hallucinations, deja vu, focal numbness/tingling/weakness, myoclonic jerks. She notices her hand twitches sometimes. Her husband denies any nocturnal seizures.  She is currently taking Phenobarbital 64.8mg  2 tablets daily and Zonisamide 300mg  qhs. She was previously drowsy on higher doses of Phenobarbital, and with reduction of dose, she currently denies any side effects to her medications. She denies any headaches, dizziness, diplopia, dysarthria/dysphagia, neck/back pain, bladder dysfunction. She has constipation. She needs a walker to ambulate and denies any falls. She denies any focal symptoms but reports tingling in both hands and weakness in both legs, no pain. She reports sleep is good, and reports good compliance to medications. She reports a history of migraines, but these have resolved.  Epilepsy Risk Factors:  A maternal cousin had seizures when younger. Otherwise she had a normal birth and early development.  There is no history of febrile convulsions, CNS infections such as meningitis/encephalitis, significant traumatic brain injury, neurosurgical procedures.  Prior AEDs: Dilantin (ineffective) Laboratory Data: Phenobarbital level 03/13/16 - 28. BMP showed a creatinine of 1.32, BUN 28; vitamin D level in 09/2015 was 25.9 EEG per [facility] notes: mild diffuse slowing on routine EEG MRI per  [facility] notes: showed evidence of old strokes 1-hour EEG: Occasional right temporal slowing, frequent right frontotemporal epileptiform  discharges  PAST MEDICAL HISTORY: Past Medical History:  Diagnosis Date  . Anemia   . Aortic stenosis   . Arthritis   . Bilateral lower extremity edema   . Bradycardia    a. 01/2013 - asymptomatic.  [name] Chronic diastolic heart failure ([place])   . CKD (chronic kidney disease)    a. baseline CKD stage III  . Coronary artery disease   . GERD (gastroesophageal reflux disease)   . Headache(784.0)   . Heart murmur   . Hyperlipidemia   . Hypertension   . Mobitz (type) I (Wenckebach's) atrioventricular block    a. 01/2013 - asymptomatic.  . Morbid obesity ([place])   . Seizure disorder ([place])   . Seizures ([place])   . TIA (transient ischemic attack) 2014   pt stated she had "mini strokes"    MEDICATIONS: Current Outpatient Medications on File Prior to Visit  Medication Sig Dispense Refill  . alendronate (FOSAMAX) 70 MG tablet TAKE 1 TABLET BY MOUTH ONCE WEEKLY WITH A FULL GLASS OF WATER ON AN EMPTY STOMACH. 12 tablet 3  . amLODipine (NORVASC) 10 MG tablet TAKE 1 TABLET BY MOUTH DAILY 90 tablet 3  . aspirin 81 MG EC tablet Take 81 mg by mouth daily.     . Calcium Carb-Cholecalciferol (CALCIUM 500 +D) 500-400 MG-UNIT TABS Take 1 tablet by mouth 2 (two) times daily. 60 tablet 2  . furosemide (LASIX) 40 MG tablet Take 0.5 tablets (20 mg total) by mouth daily. 90 tablet 1  . lisinopril (PRINIVIL,ZESTRIL) 40 MG tablet TAKE 1 TABLET BY MOUTH EVERY DAY 90 tablet 3  . PHENobarbital (LUMINAL) 64.8 MG tablet TAKE 2 TABLETS BY MOUTH EVERY DAY. 180 tablet 1  . potassium chloride SA (K-DUR,KLOR-CON) 20 MEQ tablet Take 20 mEq by mouth as directed. Take 1 tablet by mouth with each extra dose of furosemide  0  . pravastatin (PRAVACHOL) 40 MG tablet Take 1 tablet (40 mg total) by mouth daily. 90 tablet 3  . zonisamide (ZONEGRAN) 100 MG capsule Take 4 capsules (400 mg total) by mouth daily. 360 capsule 3   No current facility-administered medications on file prior to visit.     ALLERGIES: Allergies   Allergen Reactions  . Ibuprofen Other (See Comments)    Dr advised pt not to take ibuprofen    FAMILY HISTORY: Family History  Problem Relation Age of Onset  . Hypertension Unknown     SOCIAL HISTORY: Social History   Socioeconomic History  . Marital status: Married    Spouse name: Not on file  . Number of children: Not on file  . Years of education: Not on file  . Highest education level: Not on file  Social Needs  . Financial resource strain: Not on file  . Food insecurity - worry: Not on file  . Food insecurity - inability: Not on file  . Transportation needs - medical: Not on file  . Transportation needs - non-medical: Not on file  Occupational History  . Not on file  Tobacco Use  . Smoking status: Former Smoker    Types: Cigarettes    Last attempt to quit: 06/03/2000    Years since quitting: 16.8  . Smokeless tobacco: Never Used  Substance and Sexual Activity  . Alcohol use: No    Alcohol/week: 0.0 oz  . Drug use: No  . Sexual activity: Not Currently  Other Topics Concern  .  Not on file  Social History Narrative  . Not on file    REVIEW OF SYSTEMS: Constitutional: No fevers, chills, or sweats, no generalized fatigue, change in appetite Eyes: No visual changes, double vision, eye pain Ear, nose and throat: No hearing loss, ear pain, nasal congestion, sore throat Cardiovascular: No chest pain, palpitations Respiratory:  No shortness of breath at rest or with exertion, wheezes GastrointestinaI: No nausea, vomiting, diarrhea, abdominal pain, fecal incontinence Genitourinary:  No dysuria, urinary retention or frequency Musculoskeletal:  No neck pain, back pain Integumentary: No rash, pruritus, skin lesions Neurological: as above Psychiatric: No depression, insomnia, anxiety Endocrine: No palpitations, fatigue, diaphoresis, mood swings, change in appetite, change in weight, increased thirst Hematologic/Lymphatic:  No anemia, purpura,  petechiae. Allergic/Immunologic: no itchy/runny eyes, nasal congestion, recent allergic reactions, rashes  PHYSICAL EXAM: Vitals:   04/01/17 0937  BP: (!) 148/62  Pulse: (!) 59  SpO2: 99%   General: No acute distress, sitting in motorized wheelchair Head:  Normocephalic/atraumatic Neck: supple, no paraspinal tenderness, full range of motion Heart:  Regular rate and rhythm Lungs:  Clear to auscultation bilaterally Back: No paraspinal tenderness Skin/Extremities: No rash, no edema Neurological Exam: alert and oriented to person, place, and time. No aphasia or dysarthria. Fund of knowledge is appropriate.  Recent and remote memory are intact. 2/3 delayed recall.  Attention and concentration are normal.    Able to name objects and repeat phrases. Cranial nerves: Pupils equal, round, reactive to light.  Extraocular movements intact with no nystagmus. Visual fields full. Facial sensation intact. No facial asymmetry. Tongue, uvula, palate midline.  Motor: Bulk and tone normal, muscle strength 5/5 throughout with no pronator drift.  Sensation to light touch intact.  No extinction to double simultaneous stimulation.  Deep tendon reflexes +2 on both UE, unable to elicit in both LE.  Finger to nose testing intact.  Gait not tested.  IMPRESSION: This is a pleasant 72 yo RH woman with a history of  hypertension, hyperlipidemia, AV block, CAD, and right temporal lobe epilepsy. She reports a sickening feeling, and family has reported episodes of unresponsiveness. No convulsions for >10 years. No further focal seizures in the past year since Zonisamide increased. Continue Phenobarbital 64.8mg  2 tablets daily and Zonisamide 400mg  qhs. She does not drive. She will follow-up in 1 year and knows to call for any changes  Thank you for allowing me to participate in her care.  Please do not hesitate to call for any questions or concerns.  The duration of this appointment visit was 15 minutes of face-to-face time  with the patient.  Greater than 50% of this time was spent in counseling, explanation of diagnosis, planning of further management, and coordination of care.   Ellouise Newer, M.D.   CC: Dr. Posey Pronto

## 2017-04-01 NOTE — Patient Instructions (Signed)
Great seeing you! Continue Phenobarbital 64.8mg  2 tablets daily, and Zonisamide 100mg  4 capsules daily. Follow-up in 1 year, call for any changes.  Seizure Precautions: 1. If medication has been prescribed for you to prevent seizures, take it exactly as directed.  Do not stop taking the medicine without talking to your doctor first, even if you have not had a seizure in a long time.   2. Avoid activities in which a seizure would cause danger to yourself or to others.  Don't operate dangerous machinery, swim alone, or climb in high or dangerous places, such as on ladders, roofs, or girders.  Do not drive unless your doctor says you may.  3. If you have any warning that you may have a seizure, lay down in a safe place where you can't hurt yourself.    4.  No driving for 6 months from last seizure, as per [facility].   Please refer to the following link on the [place] website for more information: [email or web address]   5.  Maintain good sleep hygiene. Avoid alcohol.  6.  Contact your doctor if you have any problems that may be related to the medicine you are taking.  7.  Call 911 and bring the patient back to the ED if:        A.  The seizure lasts longer than 5 minutes.       B.  The patient doesn't awaken shortly after the seizure  C.  The patient has new problems such as difficulty seeing, speaking or moving  D.  The patient was injured during the seizure  E.  The patient has a temperature over 102 F (39C)  F.  The patient vomited and now is having trouble breathing

## 2017-04-21 ENCOUNTER — Encounter: Payer: Self-pay | Admitting: Thoracic Surgery (Cardiothoracic Vascular Surgery)

## 2017-04-21 ENCOUNTER — Other Ambulatory Visit: Payer: Self-pay

## 2017-04-21 ENCOUNTER — Ambulatory Visit: Payer: Medicare Other | Admitting: Thoracic Surgery (Cardiothoracic Vascular Surgery)

## 2017-04-21 VITALS — BP 167/66 | HR 63 | Resp 18 | Ht 61.0 in | Wt 132.4 lb

## 2017-04-21 DIAGNOSIS — I350 Nonrheumatic aortic (valve) stenosis: Secondary | ICD-10-CM | POA: Diagnosis not present

## 2017-04-21 DIAGNOSIS — I2510 Atherosclerotic heart disease of native coronary artery without angina pectoris: Secondary | ICD-10-CM

## 2017-04-21 DIAGNOSIS — I5032 Chronic diastolic (congestive) heart failure: Secondary | ICD-10-CM

## 2017-04-21 NOTE — Patient Instructions (Addendum)

## 2017-04-21 NOTE — Progress Notes (Signed)
[facility]       CARDIOTHORACIC SURGERY NOTE  Referring Provider is Sherren Mocha, MD PCP is Zada Finders, MD   HPI:  Patient is a 72 year old African-American female with multiple medical problems who returns to the office today for follow-up of aortic stenosis. She originally presented with symptoms of dizziness and syncope associated with intermittent 21 AV block. She was felt to have Mobitz type I AV block and a decision was made to observe her without pacemaker placement. Her symptoms resolved following adjustments in her medical therapy. Transthoracic echocardiograms have documented the presence of normal left ventricular systolic function with severe left ventricular hypertrophy and moderate aortic stenosis.  I first had the opportunity to see her in consultation in March 2015. Since then she has followed up regularly with Dr. Burt Knack and she was last seen in our office on 04/15/2016. Since then she has remained stable from a cardiac standpoint. She follows up regularly with her primary care physician, Dr. Posey Pronto, and she was recently seen in follow-up by Dr. Burt Knack on February 20, 2017.    She continues to have minimal symptoms.  She specifically denies any exertional shortness of breath or fatigue.  She has very limited functional capacity with severe chronic weakness involving both lower legs.  She can only stand on her legs for short periods of time and for the most part she stays in a wheelchair.  She denies any history of chest pain or chest tightness.  She denies any resting shortness of breath, PND, orthopnea, or lower extremity edema.  Her weight has been stable.  She takes low-dose Lasix once a day.  Overall she has no complaints.   Current Outpatient Medications  Medication Sig Dispense Refill  . alendronate (FOSAMAX) 70 MG tablet TAKE 1 TABLET BY MOUTH ONCE WEEKLY WITH A FULL GLASS OF WATER ON AN EMPTY STOMACH. 12 tablet 3    . amLODipine (NORVASC) 10 MG tablet TAKE 1 TABLET BY MOUTH DAILY 90 tablet 3  . aspirin 81 MG EC tablet Take 81 mg by mouth daily.     . Calcium Carb-Cholecalciferol (CALCIUM 500 +D) 500-400 MG-UNIT TABS Take 1 tablet by mouth 2 (two) times daily. 60 tablet 2  . furosemide (LASIX) 40 MG tablet Take 0.5 tablets (20 mg total) by mouth daily. 90 tablet 1  . lisinopril (PRINIVIL,ZESTRIL) 40 MG tablet TAKE 1 TABLET BY MOUTH EVERY DAY 90 tablet 3  . PHENobarbital (LUMINAL) 64.8 MG tablet Take 2 tablets (129.6 mg total) by mouth daily. 180 tablet 3  . pravastatin (PRAVACHOL) 40 MG tablet Take 1 tablet (40 mg total) by mouth daily. 90 tablet 3  . zonisamide (ZONEGRAN) 100 MG capsule Take 4 capsules (400 mg total) by mouth daily. 360 capsule 3   No current facility-administered medications for this visit.       Physical Exam:   BP (!) 167/66 (BP Location: Left Arm, Patient Position: Sitting, Cuff Size: Normal)   Pulse 63   Resp 18   Ht 5\' 1"  (1.549 m)   Wt 132 lb 6.4 oz (60.1 kg)   SpO2 99% Comment: RA  BMI 25.02 kg/m   General:  Debilitated but well-appearing  Chest:   Clear to auscultation  CV:   Regular rate and rhythm with prominent harsh systolic murmur  Incisions:  n/a  Abdomen:  Soft nontender  Extremities:  Warm and well perfused, no edema  Diagnostic Tests:  Transthoracic Echocardiography  Patient:  Sherry [name], Sherry [name] MR #:       [id number] Study Date: 02/14/2017 Gender:     F Age:        45 Height:     154.9 cm Weight:     62.2 kg BSA:        1.65 m^2 Pt. Status: Room:   SONOGRAPHER  Diamond Nickel  ATTENDING    Sherren Mocha, MD  ORDERING     Sherren Mocha, MD  [place], MD  PERFORMING   [facility], Outpatient  cc:  ------------------------------------------------------------------- LV EF: 85%  ------------------------------------------------------------------- Indications:      I10 Essential hypertension. I35.0 Aortic valve stenosis.  I50.32 Congestive heart failure.  ------------------------------------------------------------------- History:   PMH:  Bradycardia. Severe left ventricular hypertrophy. Murmur.  Coronary artery disease.  Transient ischemic attack.  Risk factors:  Dyslipidemia.  ------------------------------------------------------------------- Study Conclusions  - Left ventricle: Small LV cavity. Severely thickened LV walls-   probably infiltrative or hypertrophic cardiomyopathy. The   estimated ejection fraction was 85%. Doppler parameters are   consistent with abnormal left ventricular relaxation (grade 1   diastolic dysfunction). The E/e&' ratio is >20, suggesting   markedly elevated LV filling pressure. - Aortic valve: Calcified with leaflet restriction. Moderate   stenosis. Mean gradient (S): 25 mm Hg. Peak gradient (S): 58 mm   Hg. Valve area (VTI): 1.27 cm^2. Valve area (Vmax): 1.06 cm^2.   Valve area (Vmean): 1.27 cm^2. - Mitral valve: Calcified annulus. Mildly thickened leaflets . - Left atrium: Moderately dilated. - Right atrium: The atrium was mildly dilated. Mild diastolic   collapse is noted. - Tricuspid valve: There was trivial regurgitation. - Pulmonary arteries: PA peak pressure: 22 mm Hg (S). - Inferior vena cava: The vessel was normal in size. The   respirophasic diameter changes were in the normal range (>= 50%),   consistent with normal central venous pressure. - Pericardium, extracardiac: Small pericardial effusion. Features   were not consistent with tamponade physiology.  Impressions:  - Compared to a prior study in 02/2016, the LVEF is unchanged. The   LV walls now measure thicker. LV filling pressure is very high.   The pericardial effusion is now small, but tamponade physiology   is not present.  ------------------------------------------------------------------- Study data:  Comparison was made to the study of 02/27/2016.  Study status:  Routine.  Procedure:   The patient reported no pain pre or post test. Transthoracic echocardiography. Image quality was adequate.  Study completion:  There were no complications. Transthoracic echocardiography.  M-mode, complete 2D, spectral Doppler, and color Doppler.  Birthdate:  Patient birthdate: Jul 17, 1945.  Age:  Patient is 72 yr old.  Sex:  Gender: female. BMI: 25.9 kg/m^2.  Blood pressure:     158/79  Patient status: Outpatient.  Study date:  Study date: 02/14/2017. Study time: 10:38 AM.  Location:   Site [facility]  -------------------------------------------------------------------  ------------------------------------------------------------------- Left ventricle:  Small LV cavity. Severely thickened LV walls- probably infiltrative or hypertrophic cardiomyopathy. The estimated ejection fraction was 85%. Doppler parameters are consistent with abnormal left ventricular relaxation (grade 1 diastolic dysfunction). The E/e&' ratio is >20, suggesting markedly elevated LV filling pressure.  ------------------------------------------------------------------- Aortic valve:  Calcified with leaflet restriction. Moderate stenosis.  Doppler:     VTI ratio of LVOT to aortic valve: 0.37. Valve area (VTI): 1.27 cm^2. Indexed valve area (VTI): 0.77 cm^2/m^2. Peak velocity ratio of LVOT to aortic valve: 0.31. Valve area (Vmax): 1.06 cm^2. Indexed valve area (Vmax): 0.64 cm^2/m^2. Mean velocity  ratio of LVOT to aortic valve: 0.37. Valve area (Vmean): 1.27 cm^2. Indexed valve area (Vmean): 0.77 cm^2/m^2. Mean gradient (S): 25 mm Hg. Peak gradient (S): 58 mm Hg.  ------------------------------------------------------------------- Aorta:  Aortic root: The aortic root was normal in size. Ascending aorta: The ascending aorta was normal in size.  ------------------------------------------------------------------- Mitral valve:   Calcified annulus. Mildly thickened leaflets . Doppler:     Peak gradient (D): 3 mm  Hg.  ------------------------------------------------------------------- Left atrium:  Moderately dilated.  ------------------------------------------------------------------- Atrial septum:  No defect or patent foramen ovale was identified.   ------------------------------------------------------------------- Right ventricle:  The cavity size was normal. Wall thickness was normal. Systolic function was normal.  ------------------------------------------------------------------- Pulmonic valve:    The valve appears to be grossly normal. Doppler:  There was trivial regurgitation.  ------------------------------------------------------------------- Tricuspid valve:   Doppler:  There was trivial regurgitation.  ------------------------------------------------------------------- Pulmonary artery:   The main pulmonary artery was normal-sized.  ------------------------------------------------------------------- Right atrium:  The atrium was mildly dilated. Mild diastolic collapse is noted.  ------------------------------------------------------------------- Pericardium:  Small pericardial effusion. There was no pericardial effusion.  Doppler:  Features were not consistent with tamponade physiology.  ------------------------------------------------------------------- Systemic veins: Inferior vena cava: The vessel was normal in size. The respirophasic diameter changes were in the normal range (>= 50%), consistent with normal central venous pressure.  ------------------------------------------------------------------- Measurements   Left ventricle                            Value          Reference  LV ID, ED, PLAX chordal           (L)     36.4  mm       43 - 52  LV ID, ES, PLAX chordal           (L)     17.9  mm       23 - 38  LV fx shortening, PLAX chordal            51    %        >=29  LV PW thickness, ED                       18.2  mm       ---------  IVS/LV PW  ratio, ED                       0.85           <=1.3  Stroke volume, 2D                         104   ml       ---------  Stroke volume/bsa, 2D                     63    ml/m^2   ---------  LV e&', lateral                            3.72  cm/s     ---------  LV E/e&', lateral                          24.68          ---------  LV e&', medial  3.61  cm/s     ---------  LV E/e&', medial                           25.43          ---------  LV e&', average                            3.67  cm/s     ---------  LV E/e&', average                          25.05          ---------    Ventricular septum                        Value          Reference  IVS thickness, ED                         15.4  mm       ---------    LVOT                                      Value          Reference  LVOT ID, S                                21    mm       ---------  LVOT area                                 3.46  cm^2     ---------  LVOT peak velocity, S                     116   cm/s     ---------  LVOT mean velocity, S                     81.7  cm/s     ---------  LVOT VTI, S                               30    cm       ---------  LVOT peak gradient, S                     5     mm Hg    ---------    Aortic valve                              Value          Reference  Aortic valve peak velocity, S             380   cm/s     ---------  Aortic valve mean velocity, S             223   cm/s     ---------  Aortic valve VTI, S  81.7  cm       ---------  Aortic mean gradient, S                   25    mm Hg    ---------  Aortic peak gradient, S                   58    mm Hg    ---------  VTI ratio, LVOT/AV                        0.37           ---------  Aortic valve area, VTI                    1.27  cm^2     ---------  Aortic valve area/bsa, VTI                0.77  cm^2/m^2 ---------  Velocity ratio, peak, LVOT/AV             0.31           ---------  Aortic valve  area, peak velocity          1.06  cm^2     ---------  Aortic valve area/bsa, peak               0.64  cm^2/m^2 ---------  velocity  Velocity ratio, mean, LVOT/AV             0.37           ---------  Aortic valve area, mean velocity          1.27  cm^2     ---------  Aortic valve area/bsa, mean               0.77  cm^2/m^2 ---------  velocity    Aorta                                     Value          Reference  Aortic root ID, ED                        29    mm       ---------    Left atrium                               Value          Reference  LA ID, A-P, ES                            27    mm       ---------  LA ID/bsa, A-P                            1.63  cm/m^2   <=2.2  LA volume, S                              70    ml       ---------  LA volume/bsa, S  42.4  ml/m^2   ---------  LA volume, ES, 1-p A4C                    52    ml       ---------  LA volume/bsa, ES, 1-p A4C                31.5  ml/m^2   ---------  LA volume, ES, 1-p A2C                    89    ml       ---------  LA volume/bsa, ES, 1-p A2C                53.9  ml/m^2   ---------    Mitral valve                              Value          Reference  Mitral E-wave peak velocity               91.8  cm/s     ---------  Mitral A-wave peak velocity               110   cm/s     ---------  Mitral deceleration time          (H)     380   ms       150 - 230  Mitral peak gradient, D                   3     mm Hg    ---------  Mitral E/A ratio, peak                    0.8            ---------    Pulmonary arteries                        Value          Reference  PA pressure, S, DP                        22    mm Hg    <=30    Tricuspid valve                           Value          Reference  Tricuspid regurg peak velocity            216   cm/s     ---------  Tricuspid peak RV-RA gradient             19    mm Hg    ---------    Systemic veins                            Value          Reference   Estimated CVP                             3     mm Hg    ---------    Right ventricle  Value          Reference  RV pressure, S, DP                        22    mm Hg    <=30  RV s&', lateral, S                         7.73  cm/s     ---------  Legend: (L)  and  (H)  mark values outside specified reference range.  ------------------------------------------------------------------- Prepared and Electronically Authenticated by  Lyman Bishop MD 2018-12-28T15:47:33   Impression:  Patient has at least stage B (progressive-moderate) bordering on stage C 1 (severe) asymptomatic aortic stenosis with multivessel coronary artery disease.  She continues to deny any symptoms of chest discomfort or shortness of breath with or without activity.  She has not had exacerbations of congestive heart failure.  She denies any history of dizzy spells.  I have personally reviewed her recent follow-up echocardiogram.  The aortic valve is trileaflet with severe thickening, calcification, and restricted leaflet mobility involving all of the leaflets of the valve.  Peak velocity across the aortic valve range between 3.3 and 3.8 m/s corresponding to mean transvalvular gradient estimated 26 mmHg.  The DVI was reported 0.29 and the aortic valve area was estimated between 1.0 1.1 cm.  There is severe left ventricular hypertrophy with normal left ventricular systolic function.  Left ventricular ejection fraction is calculated greater than 70%.     Plan:  The patient's aortic stenosis has progressed, and I suspect that she will need intervention at some point in the not too distant future.  However, she remains entirely asymptomatic.  Under the circumstances it seems reasonable to continue to follow her very closely.  The patient has been reminded regarding the importance of dental hygiene.  She does not see a dentist on a regular basis and she may need referral for dental service consultation  and possible extraction if elective aortic valve replacement is to be considered.  We have not recommended any changes to her current medications.  I spent in excess of 15 minutes during the conduct of this office consultation and >50% of this time involved direct face-to-face encounter with the patient for counseling and/or coordination of their care.    Valentina Gu. Roxy Manns, MD 04/21/2017 10:52 AM

## 2017-05-05 ENCOUNTER — Other Ambulatory Visit: Payer: Self-pay | Admitting: Internal Medicine

## 2017-05-05 MED ORDER — PRAVASTATIN SODIUM 40 MG PO TABS: 40.0000 mg | ORAL_TABLET | Freq: Every day | ORAL | 3 refills | Status: DC

## 2017-05-05 NOTE — Telephone Encounter (Signed)
pravastatin (PRAVACHOL) 40 MG tablet, refill request @ [facility] on [address].

## 2017-05-05 NOTE — Telephone Encounter (Signed)
Patient is requesting a refill on pravachol

## 2017-06-05 ENCOUNTER — Telehealth: Payer: Self-pay

## 2017-06-05 NOTE — Telephone Encounter (Signed)
PHENobarbital (LUMINAL) 64.8 MG tablet, Refill request @ [name] on [address]

## 2017-06-05 NOTE — Telephone Encounter (Signed)
pls send refills, thanks

## 2017-06-05 NOTE — Telephone Encounter (Signed)
Spoke with [name] - for some reason the Rx that was sent on 2/12 did not go through.  Gave Rx verbally.

## 2017-07-21 ENCOUNTER — Other Ambulatory Visit: Payer: Self-pay | Admitting: Internal Medicine

## 2017-07-21 ENCOUNTER — Other Ambulatory Visit: Payer: Self-pay

## 2017-07-21 DIAGNOSIS — I5032 Chronic diastolic (congestive) heart failure: Secondary | ICD-10-CM

## 2017-07-21 DIAGNOSIS — M810 Age-related osteoporosis without current pathological fracture: Secondary | ICD-10-CM

## 2017-07-21 NOTE — Telephone Encounter (Signed)
appt [facility] for fri 6/7 at 0915 for refills, these were refused earlier by dr patel due to no recent appt

## 2017-07-21 NOTE — Telephone Encounter (Signed)
alendronate (FOSAMAX) 70 MG tablet,  furosemide (LASIX) 40 MG tablet, REFILL REQUEST @ [facility] [address].

## 2017-07-25 ENCOUNTER — Encounter: Payer: Self-pay | Admitting: Internal Medicine

## 2017-07-25 ENCOUNTER — Ambulatory Visit (INDEPENDENT_AMBULATORY_CARE_PROVIDER_SITE_OTHER): Payer: Medicare HMO | Admitting: Internal Medicine

## 2017-07-25 VITALS — BP 134/67 | HR 45 | Temp 98.8°F | Wt 130.3 lb

## 2017-07-25 DIAGNOSIS — N183 Chronic kidney disease, stage 3 unspecified: Secondary | ICD-10-CM

## 2017-07-25 DIAGNOSIS — I130 Hypertensive heart and chronic kidney disease with heart failure and stage 1 through stage 4 chronic kidney disease, or unspecified chronic kidney disease: Secondary | ICD-10-CM | POA: Diagnosis not present

## 2017-07-25 DIAGNOSIS — Z79899 Other long term (current) drug therapy: Secondary | ICD-10-CM

## 2017-07-25 DIAGNOSIS — G40802 Other epilepsy, not intractable, without status epilepticus: Secondary | ICD-10-CM | POA: Diagnosis not present

## 2017-07-25 DIAGNOSIS — M810 Age-related osteoporosis without current pathological fracture: Secondary | ICD-10-CM | POA: Diagnosis not present

## 2017-07-25 DIAGNOSIS — I440 Atrioventricular block, first degree: Secondary | ICD-10-CM

## 2017-07-25 DIAGNOSIS — G40909 Epilepsy, unspecified, not intractable, without status epilepticus: Secondary | ICD-10-CM

## 2017-07-25 DIAGNOSIS — I5032 Chronic diastolic (congestive) heart failure: Secondary | ICD-10-CM | POA: Diagnosis not present

## 2017-07-25 DIAGNOSIS — I10 Essential (primary) hypertension: Secondary | ICD-10-CM

## 2017-07-25 DIAGNOSIS — I350 Nonrheumatic aortic (valve) stenosis: Secondary | ICD-10-CM | POA: Diagnosis not present

## 2017-07-25 MED ORDER — ALENDRONATE SODIUM 70 MG PO TABS: ORAL_TABLET | ORAL | 3 refills | Status: DC

## 2017-07-25 MED ORDER — FUROSEMIDE 40 MG PO TABS: 40.0000 mg | ORAL_TABLET | Freq: Every day | ORAL | 1 refills | Status: DC

## 2017-07-25 NOTE — Progress Notes (Signed)
Internal Medicine Clinic Attending  Case discussed with Dr. Huang at the time of the visit.  We reviewed the resident's history and exam and pertinent patient test results.  I agree with the assessment, diagnosis, and plan of care documented in the resident's note. 

## 2017-07-25 NOTE — Assessment & Plan Note (Addendum)
Assessment At last visit, creatinine noted to be elevated compared to prior, although still within baseline at 1.62. Patient was instructed to decrease lasix to 20mg  daily and to return for follow up BMP, however, patient has continued to take lasix 40mg  daily and no repeat labs were drawn. Will continue 40mg  daily for now and repeat BMP today.  Plan - Continue lasix 40mg  daily - BMP today  ADDENDUM: Cr improved to 1.48 compared to 1.62 on prior, within patient's baseline. Called patient to inform her of results.

## 2017-07-25 NOTE — Progress Notes (Signed)
   CC: management of HTN  HPI:  Sherry [name] is a 72 y.o. female with PMH of HFpEF, CAD, CKD, HTN, AS, seizure disorder, and 1st degree AV block who presents for management of HTN.  She has been doing well since her last visit in January. She denies chest pain, shortness of breath, orthopnea, dysuria, or lightheadedness. She reports adherence with her medications. She is continuing to take lasix 40mg  daily, despite note that states she was instructed to decrease to lasix 20mg  daily due to mild increase in creatinine.  Please see the assessment and plan below for the status of the patient's chronic medical problems.  Past Medical History:  Diagnosis Date  . Anemia   . Aortic stenosis   . Arthritis   . Bilateral lower extremity edema   . Bradycardia    a. 01/2013 - asymptomatic.  [name] Chronic diastolic heart failure ([place])   . CKD (chronic kidney disease)    a. baseline CKD stage III  . Coronary artery disease   . GERD (gastroesophageal reflux disease)   . Headache(784.0)   . Heart murmur   . Hyperlipidemia   . Hypertension   . Mobitz (type) I (Wenckebach's) atrioventricular block    a. 01/2013 - asymptomatic.  . Morbid obesity ([place])   . Seizure disorder ([place])   . Seizures ([place])   . TIA (transient ischemic attack) 2014   pt stated she had "mini strokes"   Review of Systems:  NEURO: Negative for lightheadedness CV: Negative for chest pain or orthopnea PULM: Negative for SOB EXT: Negative for LE swelling  Physical Exam:  Vitals:   07/25/17 0918  BP: 134/67  Pulse: (!) 45  Temp: 98.8 F (37.1 C)  TempSrc: Oral  SpO2: 100%  Weight: 130 lb 4.8 oz (59.1 kg)   GEN: Sitting comfortably in wheelchair in NAD CV: Bradycardic & RR, crescendo-decrescendo systolic murmur best heard at LUSB, radiating to carotids PULM: CTAB, no wheezes or rales EXT: No LE edema  Assessment & Plan:   See Encounters Tab for problem based charting.  Patient discussed with Dr. Lynnae January

## 2017-07-25 NOTE — Assessment & Plan Note (Signed)
Assessment Asymptomatic. Euvolemic on exam. Taking lasix 40mg  daily  Plan - Continue lasix 40mg  daily and lisinopril 40mg  daily

## 2017-07-25 NOTE — Assessment & Plan Note (Signed)
Assessment BP well-controlled at 134/67 today. Reports adherence to anti-hypertensives  Plan - Continue amlodipine 10mg  daily and lisinopril 40mg  daily - Continue lasix 40mg  daily

## 2017-07-25 NOTE — Assessment & Plan Note (Addendum)
Assessment Follows with Neurology. Reports adherence with phenobarbital and zonisamide. She does not drive. It has been >1 year for phenobarbital level, so will recheck today.  Plan - Continue phenobarbital and zonisamide - Check serum phenobarbital level - Continue f/u with Neurology  ADDENDUM: Serum phenobarbital level normal. Called patient to inform her of results.

## 2017-07-25 NOTE — Assessment & Plan Note (Addendum)
Stable. Tolerating bisphosphonate. - Refilled alendronate 70mg  weekly (she takes it on Fridays)

## 2017-07-25 NOTE — Patient Instructions (Signed)
FOLLOW-UP INSTRUCTIONS When: 6 months For: follow up of blood pressure What to bring: medications   Sherry [name],  It was nice to meet you today.  I have sent a refill of your lasix and fosamax to the pharmacy.  You are doing a great job with your blood pressure. Please continue taking your medicines as you are doing. - We are checking some bloodwork today. I will let you know the results.  Please return to see [place] in about 6 months.  If you have any questions or concerns, call our clinic at [phone] or after hours call [phone] and ask for the internal medicine resident on call.

## 2017-07-28 LAB — BMP8+ANION GAP
Anion Gap: 14 mmol/L (ref 10.0–18.0)
BUN/Creatinine Ratio: 23 (ref 12–28)
BUN: 34 mg/dL — ABNORMAL HIGH (ref 8–27)
CO2: 20 mmol/L (ref 20–29)
Calcium: 8.5 mg/dL — ABNORMAL LOW (ref 8.7–10.3)
Chloride: 104 mmol/L (ref 96–106)
Creatinine, Ser: 1.48 mg/dL — ABNORMAL HIGH (ref 0.57–1.00)
GFR calc Af Amer: 41 mL/min/{1.73_m2} — ABNORMAL LOW (ref >59)
GFR calc non Af Amer: 35 mL/min/{1.73_m2} — ABNORMAL LOW (ref >59)
GLUCOSE: 77 mg/dL (ref 65–99)
POTASSIUM: 4.9 mmol/L (ref 3.5–5.2)
SODIUM: 138 mmol/L (ref 134–144)

## 2017-07-28 LAB — PHENOBARBITAL LEVEL: Phenobarbital, Serum: 30 ug/mL (ref 15–40)

## 2017-07-29 DIAGNOSIS — Z809 Family history of malignant neoplasm, unspecified: Secondary | ICD-10-CM | POA: Diagnosis not present

## 2017-07-29 DIAGNOSIS — Z87891 Personal history of nicotine dependence: Secondary | ICD-10-CM | POA: Diagnosis not present

## 2017-07-29 DIAGNOSIS — Z993 Dependence on wheelchair: Secondary | ICD-10-CM | POA: Diagnosis not present

## 2017-07-29 DIAGNOSIS — Z8249 Family history of ischemic heart disease and other diseases of the circulatory system: Secondary | ICD-10-CM | POA: Diagnosis not present

## 2017-07-29 DIAGNOSIS — E785 Hyperlipidemia, unspecified: Secondary | ICD-10-CM | POA: Diagnosis not present

## 2017-07-29 DIAGNOSIS — I10 Essential (primary) hypertension: Secondary | ICD-10-CM | POA: Diagnosis not present

## 2017-07-29 DIAGNOSIS — R609 Edema, unspecified: Secondary | ICD-10-CM | POA: Diagnosis not present

## 2017-07-29 DIAGNOSIS — G40909 Epilepsy, unspecified, not intractable, without status epilepticus: Secondary | ICD-10-CM | POA: Diagnosis not present

## 2017-07-29 DIAGNOSIS — Z7982 Long term (current) use of aspirin: Secondary | ICD-10-CM | POA: Diagnosis not present

## 2017-08-07 ENCOUNTER — Other Ambulatory Visit: Payer: Self-pay | Admitting: Internal Medicine

## 2017-08-07 ENCOUNTER — Telehealth: Payer: Self-pay | Admitting: Internal Medicine

## 2017-08-07 NOTE — Telephone Encounter (Signed)
Called pharm they will transfer from [facility] to [facility]

## 2017-08-07 NOTE — Telephone Encounter (Signed)
Patient is requesting refill Pravastatin 40mg , pls send to [facility] on [address]

## 2017-09-08 ENCOUNTER — Encounter: Payer: Self-pay | Admitting: *Deleted

## 2017-10-15 ENCOUNTER — Other Ambulatory Visit: Payer: Self-pay | Admitting: Internal Medicine

## 2017-10-15 NOTE — Telephone Encounter (Signed)
Needs PCP appt HTN F/U Dec +/- 2 months

## 2017-12-03 ENCOUNTER — Ambulatory Visit (INDEPENDENT_AMBULATORY_CARE_PROVIDER_SITE_OTHER): Payer: Medicare HMO | Admitting: *Deleted

## 2017-12-03 DIAGNOSIS — Z23 Encounter for immunization: Secondary | ICD-10-CM

## 2017-12-05 ENCOUNTER — Ambulatory Visit: Payer: Medicare HMO | Admitting: Cardiovascular Disease

## 2017-12-05 ENCOUNTER — Encounter: Payer: Self-pay | Admitting: Cardiovascular Disease

## 2017-12-05 VITALS — BP 122/76 | HR 48 | Ht 61.0 in | Wt 135.0 lb

## 2017-12-05 DIAGNOSIS — I350 Nonrheumatic aortic (valve) stenosis: Secondary | ICD-10-CM | POA: Diagnosis not present

## 2017-12-05 DIAGNOSIS — I2510 Atherosclerotic heart disease of native coronary artery without angina pectoris: Secondary | ICD-10-CM | POA: Diagnosis not present

## 2017-12-05 DIAGNOSIS — I5032 Chronic diastolic (congestive) heart failure: Secondary | ICD-10-CM | POA: Diagnosis not present

## 2017-12-05 NOTE — Progress Notes (Signed)
Cardiology Office Note:    Date:  12/05/2017   ID:  Alleen Kehm, DOB 05/18/1945, MRN [id number]  PCP:  [name], MD  Cardiologist:  Sherren Mocha, MD  Electrophysiologist:  None   Referring MD: [name], MD   Chief Complaint  Patient presents with  . Follow-up    aortic valve disease    History of Present Illness:    Sherry [name] is a 72 y.o. female with a hx of coronary artery disease, chronic diastolic heart failure, and aortic stenosis.  She is been noted to have multivessel CAD and moderately severe aortic stenosis.  Multidisciplinary review recommended ongoing medical therapy in the setting of her poor functional capacity and chronic medical conditions.  She has done remarkably well with minimal symptoms over time.  She is here with her husband today. Remains asymptomatic from a cardiac perspective. Today, she denies symptoms of palpitations, chest pain, shortness of breath, orthopnea, PND, lower extremity edema, dizziness, or syncope. She is in a wheelchair as usual, but reports functional independence at home. Able to bathe, cook, and transfer without assistance.   Past Medical History:  Diagnosis Date  . Anemia   . Aortic stenosis   . Arthritis   . Bilateral lower extremity edema   . Bradycardia    a. 01/2013 - asymptomatic.  [name] Chronic diastolic heart failure ([place])   . CKD (chronic kidney disease)    a. baseline CKD stage III  . Coronary artery disease   . GERD (gastroesophageal reflux disease)   . Headache(784.0)   . Heart murmur   . Hyperlipidemia   . Hypertension   . Mobitz (type) I (Wenckebach's) atrioventricular block    a. 01/2013 - asymptomatic.  . Morbid obesity ([place])   . Seizure disorder ([place])   . Seizures ([place])   . TIA (transient ischemic attack) 2014   pt stated she had "mini strokes"    Past Surgical History:  Procedure Laterality Date  . ABDOMINAL HYSTERECTOMY    . LEFT AND RIGHT HEART CATHETERIZATION WITH CORONARY  ANGIOGRAM N/A 05/24/2013   Procedure: LEFT AND RIGHT HEART CATHETERIZATION WITH CORONARY ANGIOGRAM;  Surgeon: Sinclair Grooms, MD;  Location: [facility] CATH LAB;  Service: Cardiovascular;  Laterality: N/A;    Current Medications: No outpatient medications have been marked as taking for the 12/05/17 encounter (Office Visit) with Sherren Mocha, MD.     Allergies:   Ibuprofen   Social History   Socioeconomic History  . Marital status: Married    Spouse name: Not on file  . Number of children: Not on file  . Years of education: Not on file  . Highest education level: Not on file  Occupational History  . Not on file  Social Needs  . Financial resource strain: Not on file  . Food insecurity:    Worry: Not on file    Inability: Not on file  . Transportation needs:    Medical: Not on file    Non-medical: Not on file  Tobacco Use  . Smoking status: Former Smoker    Types: Cigarettes    Last attempt to quit: 06/03/2000    Years since quitting: 17.5  . Smokeless tobacco: Never Used  Substance and Sexual Activity  . Alcohol use: No    Alcohol/week: 0.0 standard drinks  . Drug use: No  . Sexual activity: Not Currently  Lifestyle  . Physical activity:    Days per week: Not on file    Minutes per session:  Not on file  . Stress: Not on file  Relationships  . Social connections:    Talks on phone: Not on file    Gets together: Not on file    Attends religious service: Not on file    Active member of club or organization: Not on file    Attends meetings of clubs or organizations: Not on file    Relationship status: Not on file  Other Topics Concern  . Not on file  Social History Narrative  . Not on file     Family History: The patient's family history includes Hypertension in her unknown relative.  ROS:   Please see the history of present illness.    All other systems reviewed and are negative.  EKGs/Labs/Other Studies Reviewed:    The following studies were reviewed  today: Echo 02-14-2017: Study Conclusions  - Left ventricle: Small LV cavity. Severely thickened LV walls-   probably infiltrative or hypertrophic cardiomyopathy. The   estimated ejection fraction was 85%. Doppler parameters are   consistent with abnormal left ventricular relaxation (grade 1   diastolic dysfunction). The E/e&' ratio is >20, suggesting   markedly elevated LV filling pressure. - Aortic valve: Calcified with leaflet restriction. Moderate   stenosis. Mean gradient (S): 25 mm Hg. Peak gradient (S): 58 mm   Hg. Valve area (VTI): 1.27 cm^2. Valve area (Vmax): 1.06 cm^2.   Valve area (Vmean): 1.27 cm^2. - Mitral valve: Calcified annulus. Mildly thickened leaflets . - Left atrium: Moderately dilated. - Right atrium: The atrium was mildly dilated. Mild diastolic   collapse is noted. - Tricuspid valve: There was trivial regurgitation. - Pulmonary arteries: PA peak pressure: 22 mm Hg (S). - Inferior vena cava: The vessel was normal in size. The   respirophasic diameter changes were in the normal range (>= 50%),   consistent with normal central venous pressure. - Pericardium, extracardiac: Small pericardial effusion. Features   were not consistent with tamponade physiology.  Impressions:  - Compared to a prior study in 02/2016, the LVEF is unchanged. The   LV walls now measure thicker. LV filling pressure is very high.   The pericardial effusion is now small, but tamponade physiology   is not present.   EKG:  EKG is not ordered today.    Recent Labs: 07/25/2017: BUN 34; Creatinine, Ser 1.48; Potassium 4.9; Sodium 138  Recent Lipid Panel    Component Value Date/Time   CHOL 174 01/16/2015 1040   TRIG 58 01/16/2015 1040   HDL 76 01/16/2015 1040   CHOLHDL 2.3 01/16/2015 1040   VLDL 12 01/16/2015 1040   LDLCALC 86 01/16/2015 1040    Physical Exam:    VS:  BP 122/76   Pulse (!) 48   Ht 5\' 1"  (1.549 m)   Wt 135 lb (61.2 kg)   SpO2 99%   BMI 25.51 kg/m     Wt  Readings from Last 3 Encounters:  12/05/17 135 lb (61.2 kg)  07/25/17 130 lb 4.8 oz (59.1 kg)  04/21/17 132 lb 6.4 oz (60.1 kg)     GEN: elderly appearing woman in no acute distress HEENT: Normal NECK: No JVD; No carotid bruits LYMPHATICS: No lymphadenopathy CARDIAC: RRR, harsh 3/6 mid to late peaking systolic murmur at the right upper sternal border, diminished A2 RESPIRATORY:  Clear to auscultation without rales, wheezing or rhonchi  ABDOMEN: Soft, non-tender, non-distended MUSCULOSKELETAL:  No edema; No deformity  SKIN: Warm and dry NEUROLOGIC:  Alert and oriented x 3 PSYCHIATRIC:  Normal affect   ASSESSMENT:    1. Chronic diastolic congestive heart failure ([place])   2. Aortic valve stenosis, etiology of cardiac valve disease unspecified   3. Coronary artery disease involving native coronary artery of native heart without angina pectoris    PLAN:    In order of problems listed above:  1. The patient appears stable without symptoms at present.  Exam suggest euvolemia.  She has limited functional capacity and I suspect just does not get to threshold of becoming symptomatic.  Her echocardiogram has some suggestive features of infiltrative cardiomyopathy or amyloidosis.  We will check a cardiac MRI. 2. The patient remains asymptomatic.  We will repeat an echocardiogram in approximately 6 months and see her back in follow-up at that time.  She is counseled regarding potential symptoms of shortness of breath, chest discomfort, or lightheadedness/syncope. 3. The patient has no symptoms of angina on aspirin for antiplatelet therapy as well as amlodipine and lisinopril.  She is taking a statin drug.  She is not a candidate for beta-blocker because of AV conduction disease and bradycardia.    Medication Adjustments/Labs and Tests Ordered: Current medicines are reviewed at length with the patient today.  Concerns regarding medicines are outlined above.  Orders Placed This Encounter   Procedures  . MR CARDIAC MORPHOLOGY W WO CONTRAST  . ECHOCARDIOGRAM COMPLETE   No orders of the defined types were placed in this encounter.   Patient Instructions  Medication Instructions:  Your provider recommends that you continue on your current medications as directed. Please refer to the Current Medication list given to you today.    Labwork: None  Testing/Procedures: Dr. Burt Knack recommends you have a CARDIAC MRI.  Follow-Up: You will have an echo and visit with Dr. Burt Knack in 6 months. You will be called to arrange these appointments.  Any Other Special Instructions Will Be Listed Below (If Applicable).     If you need a refill on your cardiac medications before your next appointment, please call your pharmacy.      Signed, Sherren Mocha, MD  12/05/2017 8:53 AM    [place]

## 2017-12-05 NOTE — Patient Instructions (Signed)
Medication Instructions:  Your provider recommends that you continue on your current medications as directed. Please refer to the Current Medication list given to you today.    Labwork: None  Testing/Procedures: Dr. Burt Knack recommends you have a CARDIAC MRI.  Follow-Up: You will have an echo and visit with Dr. Burt Knack in 6 months. You will be called to arrange these appointments.  Any Other Special Instructions Will Be Listed Below (If Applicable).     If you need a refill on your cardiac medications before your next appointment, please call your pharmacy.

## 2017-12-11 ENCOUNTER — Other Ambulatory Visit: Payer: Self-pay | Admitting: Internal Medicine

## 2017-12-11 DIAGNOSIS — I10 Essential (primary) hypertension: Secondary | ICD-10-CM

## 2017-12-12 ENCOUNTER — Other Ambulatory Visit: Payer: Self-pay | Admitting: *Deleted

## 2017-12-12 DIAGNOSIS — I10 Essential (primary) hypertension: Secondary | ICD-10-CM

## 2017-12-12 MED ORDER — LISINOPRIL 40 MG PO TABS: 40.0000 mg | ORAL_TABLET | Freq: Every day | ORAL | 3 refills | Status: DC

## 2017-12-15 ENCOUNTER — Encounter: Payer: Self-pay | Admitting: *Deleted

## 2017-12-16 ENCOUNTER — Other Ambulatory Visit: Payer: Self-pay | Admitting: Internal Medicine

## 2017-12-16 NOTE — Telephone Encounter (Signed)
She just moved and lost her lisinopril (PRINIVIL,ZESTRIL) 40 MG tablet  At [name]  ;pt contact [phone]

## 2017-12-19 NOTE — Telephone Encounter (Signed)
rtc 10/29, spoke w/ pt and ask her to call or go to pharm and see if insurance would pay 1x replacement, also suggested she call insurance customer service and explain situation. She was agreeable

## 2017-12-24 ENCOUNTER — Ambulatory Visit (HOSPITAL_COMMUNITY)
Admission: RE | Admit: 2017-12-24 | Discharge: 2017-12-24 | Disposition: A | Discharge status: Home / Self Care | Payer: Medicare HMO | Source: Ambulatory Visit | Attending: Cardiovascular Disease | Admitting: Cardiovascular Disease

## 2017-12-24 DIAGNOSIS — I350 Nonrheumatic aortic (valve) stenosis: Secondary | ICD-10-CM | POA: Insufficient documentation

## 2017-12-24 DIAGNOSIS — I5032 Chronic diastolic (congestive) heart failure: Secondary | ICD-10-CM

## 2017-12-24 DIAGNOSIS — I313 Pericardial effusion (noninflammatory): Secondary | ICD-10-CM | POA: Diagnosis not present

## 2017-12-24 LAB — CREATININE, SERUM
CREATININE: 1.32 mg/dL — AB (ref 0.44–1.00)
GFR calc Af Amer: 46 mL/min — ABNORMAL LOW (ref >60)
GFR, EST NON AFRICAN AMERICAN: 40 mL/min — AB (ref >60)

## 2017-12-24 MED ORDER — GADOBUTROL 1 MMOL/ML IV SOLN
7.5000 mL | Freq: Once | INTRAVENOUS | Status: AC | PRN
  Administered 2017-12-24: 7 mL via INTRAVENOUS

## 2017-12-25 ENCOUNTER — Telehealth: Payer: Self-pay

## 2017-12-25 DIAGNOSIS — R9389 Abnormal findings on diagnostic imaging of other specified body structures: Secondary | ICD-10-CM

## 2017-12-25 DIAGNOSIS — I517 Cardiomegaly: Secondary | ICD-10-CM

## 2017-12-25 NOTE — Telephone Encounter (Signed)
-----   Message from Sherren Mocha, MD sent at 12/24/2017  9:54 PM EST ----- MR study suggestive of amyloid as suspected. Will check with Dr Haroldine Laws and Aundra Dubin about referral to [facility].

## 2017-12-25 NOTE — Telephone Encounter (Signed)
Informed patient of results and verbal understanding expressed.  Per Dr. Burt Knack and Dr. Aundra Dubin, referral placed for [facility]. Patient was grateful for call and agrees with treatment plan.

## 2017-12-26 ENCOUNTER — Other Ambulatory Visit: Payer: Self-pay | Admitting: Internal Medicine

## 2017-12-26 DIAGNOSIS — I5032 Chronic diastolic (congestive) heart failure: Secondary | ICD-10-CM

## 2017-12-26 NOTE — Telephone Encounter (Signed)
Amyloid scan scheduled 11/22.

## 2017-12-26 NOTE — Telephone Encounter (Signed)
Call made to pharmacy-no refills left on rx.  Will send request to pcp for review, please advise.Despina Hidden Cassady11/8/201912:01 PM

## 2017-12-26 NOTE — Telephone Encounter (Signed)
Per Dr. Burt Knack, PYP scan ordered for scheduling prior to consult with Dr. Aundra Dubin. Patient agrees with treatment plan.

## 2017-12-26 NOTE — Addendum Note (Signed)
Addended by: Harland German A on: 12/26/2017 12:42 PM   Modules accepted: Orders

## 2017-12-31 ENCOUNTER — Other Ambulatory Visit: Payer: Self-pay

## 2017-12-31 ENCOUNTER — Encounter: Payer: Self-pay | Admitting: Internal Medicine

## 2017-12-31 ENCOUNTER — Ambulatory Visit (INDEPENDENT_AMBULATORY_CARE_PROVIDER_SITE_OTHER): Payer: Medicare HMO | Admitting: Internal Medicine

## 2017-12-31 VITALS — BP 147/72 | HR 56 | Temp 98.5°F | Ht 61.0 in | Wt 133.2 lb

## 2017-12-31 DIAGNOSIS — K219 Gastro-esophageal reflux disease without esophagitis: Secondary | ICD-10-CM | POA: Diagnosis not present

## 2017-12-31 DIAGNOSIS — Z7983 Long term (current) use of bisphosphonates: Secondary | ICD-10-CM

## 2017-12-31 DIAGNOSIS — Z0000 Encounter for general adult medical examination without abnormal findings: Secondary | ICD-10-CM

## 2017-12-31 DIAGNOSIS — I2510 Atherosclerotic heart disease of native coronary artery without angina pectoris: Secondary | ICD-10-CM | POA: Diagnosis not present

## 2017-12-31 DIAGNOSIS — I5032 Chronic diastolic (congestive) heart failure: Secondary | ICD-10-CM | POA: Diagnosis not present

## 2017-12-31 DIAGNOSIS — R011 Cardiac murmur, unspecified: Secondary | ICD-10-CM

## 2017-12-31 DIAGNOSIS — M810 Age-related osteoporosis without current pathological fracture: Secondary | ICD-10-CM

## 2017-12-31 DIAGNOSIS — I441 Atrioventricular block, second degree: Secondary | ICD-10-CM

## 2017-12-31 DIAGNOSIS — N183 Chronic kidney disease, stage 3 unspecified: Secondary | ICD-10-CM

## 2017-12-31 DIAGNOSIS — I130 Hypertensive heart and chronic kidney disease with heart failure and stage 1 through stage 4 chronic kidney disease, or unspecified chronic kidney disease: Secondary | ICD-10-CM | POA: Diagnosis not present

## 2017-12-31 DIAGNOSIS — G40909 Epilepsy, unspecified, not intractable, without status epilepticus: Secondary | ICD-10-CM | POA: Diagnosis not present

## 2017-12-31 DIAGNOSIS — I350 Nonrheumatic aortic (valve) stenosis: Secondary | ICD-10-CM | POA: Diagnosis not present

## 2017-12-31 DIAGNOSIS — Z79899 Other long term (current) drug therapy: Secondary | ICD-10-CM

## 2017-12-31 DIAGNOSIS — I10 Essential (primary) hypertension: Secondary | ICD-10-CM

## 2017-12-31 NOTE — Assessment & Plan Note (Signed)
Assessment: She takes phenobarbital and zonisamide.  Last phenobarbital level on 07/25/2017 was within normal limits.  She denies any recent seizure activity.  Plan: 1.  Continue phenobarbital 129.6 mg daily, zonisamide 400 mg daily

## 2017-12-31 NOTE — Progress Notes (Signed)
CC: Follow-up of her chronic medical conditions.  HPI:  [name] is a 72 y.o. female with hypertension, chronic diastolic (grade 1) congestive heart failure, second-degree AV heart block, aortic stenosis, multivessel coronary artery disease, GERD, osteoporosis, generative cervical disc, CKD stage III, who presents for follow-up of her chronic medical conditions.  Chronic diastolic (grade 1) congestive heart failure: Echo completed on 02/14/2017 showed stable aortic stenosis and severe left ventricular hypertrophy.  Possible restrictive/infiltrative cardiomyopathy noted.  Cardiac MRI performed 1 week ago which showed signs suggesting cardiac amyloidosis. A myocardial amyloid scan has been scheduled and she has been referred to the advanced heart failure clinic.  Her cardioloist recommended continued medical management with amlodipine, lisinopril, and furosemide.  Today Sherry [name] denies any chest pain, shortness of breath, lower extremity edema, dizziness.  She is wheelchair-bound due to severe arthritis of the lower extremities.  Hypertension: Currently taking amlodipine 10 mg daily, lisinopril 40 mg daily, and Lasix 40 mg daily.  Blood pressure today is 147/72.  Chronic kidney disease stage III: Baseline creatinine between 1.3-1.5.  Last creatinine on 12/24/2017 was 1.32.  Epilepsy: Takes phenobarbital and zonisamide.  Last phenobarbital level on 07/25/2017 was within normal limits.  She denies any recent seizures.  Osteoporosis:  She is currently taking alendronate 70 mg weekly.  She reports good compliance with this medication.  Healthcare maintenance: Breast cancer screening-screening mammogram performed on 02/22/2015 and showed no findings suspicious for malignancy. Colon cancer screening- she had a screening colonoscopy on 11/26/2006 which showed small internal and external hemorrhoids without polyps.  She has not had a repeat colonoscopy since then.    Past Medical  History:  Diagnosis Date  . Anemia   . Aortic stenosis   . Arthritis   . Bilateral lower extremity edema   . Bradycardia    a. 01/2013 - asymptomatic.  [name] Chronic diastolic heart failure ([place])   . CKD (chronic kidney disease)    a. baseline CKD stage III  . Coronary artery disease   . GERD (gastroesophageal reflux disease)   . Headache(784.0)   . Heart murmur   . Hyperlipidemia   . Hypertension   . Mobitz (type) I (Wenckebach's) atrioventricular block    a. 01/2013 - asymptomatic.  . Morbid obesity ([place])   . Seizure disorder ([place])   . Seizures ([place])   . TIA (transient ischemic attack) 2014   pt stated she had "mini strokes"   Review of Systems:   Review of Systems  Constitutional: Negative for chills, fever and weight loss.  HENT: Negative for congestion, sinus pain and sore throat.   Respiratory: Negative for cough, sputum production and shortness of breath.   Cardiovascular: Negative for chest pain, palpitations, orthopnea and leg swelling.  Gastrointestinal: Negative for abdominal pain, constipation, diarrhea, nausea and vomiting.  Genitourinary: Negative for dysuria, frequency, hematuria and urgency.  Skin: Negative for itching and rash.  Neurological: Negative for dizziness, weakness and headaches.    Physical Exam:  Vitals:   12/31/17 1419  BP: (!) 147/72  Pulse: (!) 56  Temp: 98.5 F (36.9 C)  SpO2: 100%  Weight: 133 lb 3.2 oz (60.4 kg)  Height: 5\' 1"  (1.549 m)   Physical Exam  Constitutional: She appears well-developed and well-nourished.  HENT:  Head: Normocephalic and atraumatic.  Eyes: Conjunctivae and EOM are normal.  Neck: Normal range of motion.  Cardiovascular: Normal rate and regular rhythm.  Crescendo decrescendo systolic murmur present.  Pulmonary/Chest: Effort normal and breath sounds normal. She has  no wheezes.  Abdominal: Soft. She exhibits no distension. There is no tenderness.  Musculoskeletal: She exhibits no edema or tenderness.    Decreased range of motion of the lower extremities at the knees laterally.  She has difficulty flexing at her knees.  Neurological: She is alert.  Skin: Skin is warm and dry.  Psychiatric: She has a normal mood and affect. Her behavior is normal.  Nursing note and vitals reviewed.   Assessment & Plan:   See Encounters Tab for problem based charting.  Patient seen with Dr. Eppie Gibson

## 2017-12-31 NOTE — Assessment & Plan Note (Signed)
Assessment: She is currently taking alendronate 70 mg weekly.  She reports good compliance with this medication.  I am however concerned about her use of this medication with her chronic kidney disease (contraindicated in patients with GFR of less than 35).  Most recent GFR is 41.  If her kidney function worsens she will need to stop taking alendronate.  Plan: 1.  Continue alendronate 70 mg weekly

## 2017-12-31 NOTE — Assessment & Plan Note (Signed)
Assessment: Last creatinine on 12/24/2017 was 1.32 (at her baseline).  Plan: 1.  Continue to monitor

## 2017-12-31 NOTE — Assessment & Plan Note (Addendum)
Assessment: Sherry [name] has had an extensive work-up of her heart failure which has revealed signs suggesting cardiac amyloidosis.  She has a myocardial amyloid scan scheduled for 01/09/18. Her cardiologist has recommended continued medical management with amlodipine, lisinopril, and feel furosemide.  She is currently asymptomatic.    Plan: 1.  Continue amlodipine 10 mg daily, lisinopril 40 mg daily, furosemide 40 mg daily.

## 2017-12-31 NOTE — Assessment & Plan Note (Signed)
Assessment: Blood pressure elevated today at 147/72.  She has however had multiple blood pressures in the past within acceptable limits.  I hesitate to add another blood pressure medication to her antihypertensive medication regiment (amlodipine, lisinopril, Lasix) given her severe aortic stenosis.  I will continue to monitor her blood pressure closely and will likely add another blood pressure medication if she continues to have elevated BP.  Plan:  1.  Continue amlodipine 10 mg daily, lisinopril 40 mg daily, and Lasix 40 mg daily

## 2017-12-31 NOTE — Assessment & Plan Note (Addendum)
Assessment: She is due for screening mammogram which I have ordered today.  Is also due for a colonoscopy but declines this today.  She is however interested in the fecal immunochemical test which I have ordered.  Plan: 1.  Follow-up mammogram and FIT  ADDENDUM: FIT Negative. Will need to repeat in 1 year.

## 2018-01-01 NOTE — Progress Notes (Signed)
Patient ID: Sherry [name], female   DOB: 04-12-1945, 72 y.o.   MRN: [id number]  I saw and evaluated the patient.  I personally confirmed the key portions of Dr. Jerlyn Ly history and exam and reviewed pertinent patient test results.  The assessment, diagnosis, and plan were formulated together and I agree with the documentation in the resident's note.

## 2018-01-06 ENCOUNTER — Telehealth (HOSPITAL_COMMUNITY): Payer: Self-pay | Admitting: *Deleted

## 2018-01-06 NOTE — Telephone Encounter (Signed)
Patient given detailed instructions per Myocardial Perfusion Study Information Sheet for the test on 01/09/18 at 1230. Patient notified to arrive 15 minutes early and that it is imperative to arrive on time for appointment to keep from having the test rescheduled.  If you need to cancel or reschedule your appointment, please call the office within 24 hours of your appointment. . Patient verbalized understanding.Sherry [name], Ranae Palms

## 2018-01-07 DIAGNOSIS — Z0000 Encounter for general adult medical examination without abnormal findings: Secondary | ICD-10-CM | POA: Diagnosis not present

## 2018-01-08 ENCOUNTER — Other Ambulatory Visit: Payer: Medicare HMO

## 2018-01-08 DIAGNOSIS — Z0000 Encounter for general adult medical examination without abnormal findings: Secondary | ICD-10-CM

## 2018-01-09 ENCOUNTER — Ambulatory Visit (HOSPITAL_COMMUNITY): Payer: Medicare HMO | Attending: Cardiology

## 2018-01-09 DIAGNOSIS — I517 Cardiomegaly: Secondary | ICD-10-CM | POA: Diagnosis not present

## 2018-01-09 DIAGNOSIS — R9389 Abnormal findings on diagnostic imaging of other specified body structures: Secondary | ICD-10-CM | POA: Diagnosis not present

## 2018-01-09 MED ORDER — TECHNETIUM TC 99M PYROPHOSPHATE
20.3000 | Freq: Once | INTRAVENOUS | Status: AC
  Administered 2018-01-09: 20.3 via INTRAVENOUS

## 2018-01-10 LAB — FECAL OCCULT BLOOD, IMMUNOCHEMICAL: Fecal Occult Bld: NEGATIVE

## 2018-01-16 ENCOUNTER — Telehealth: Payer: Self-pay | Admitting: Internal Medicine

## 2018-01-16 NOTE — Telephone Encounter (Signed)
FIT negative. I called Ms. Lubinski to inform her of the results. She will need a repeat in 1 year. She expressed understanding and agreement.

## 2018-02-02 ENCOUNTER — Ambulatory Visit (HOSPITAL_COMMUNITY): Payer: Medicare HMO | Admitting: Internal Medicine

## 2018-03-02 ENCOUNTER — Other Ambulatory Visit: Payer: Self-pay | Admitting: Neurology

## 2018-03-02 DIAGNOSIS — G40209 Localization-related (focal) (partial) symptomatic epilepsy and epileptic syndromes with complex partial seizures, not intractable, without status epilepticus: Secondary | ICD-10-CM

## 2018-03-07 ENCOUNTER — Other Ambulatory Visit: Payer: Self-pay | Admitting: Neurology

## 2018-03-07 DIAGNOSIS — G40209 Localization-related (focal) (partial) symptomatic epilepsy and epileptic syndromes with complex partial seizures, not intractable, without status epilepticus: Secondary | ICD-10-CM

## 2018-03-10 ENCOUNTER — Other Ambulatory Visit: Payer: Self-pay

## 2018-03-10 NOTE — Telephone Encounter (Signed)
This was already done 1/13 by dr Delice Lesch, neuro, sent to [facility]

## 2018-03-10 NOTE — Telephone Encounter (Signed)
PHENobarbital (LUMINAL) 64.8 MG tablet   Refill request @  [facility] [address], [place] [phone] (Phone) [phone] (Fax)

## 2018-03-11 ENCOUNTER — Telehealth: Payer: Self-pay

## 2018-03-11 NOTE — Telephone Encounter (Signed)
Needs to speak with a nurse about PHENobarbital (LUMINAL) 64.8 MG tablet

## 2018-03-11 NOTE — Telephone Encounter (Signed)
Pt was informed once again that dr Delice Lesch does her script for phenobarb, looked at script for her and noticed it was set to print ask her to call dr aquino's office and speak to nurse, gave her ph# will send to dr Delice Lesch for phenobarb

## 2018-03-12 ENCOUNTER — Telehealth: Payer: Self-pay | Admitting: Neurology

## 2018-03-12 ENCOUNTER — Other Ambulatory Visit: Payer: Self-pay | Admitting: Neurology

## 2018-03-12 DIAGNOSIS — G40209 Localization-related (focal) (partial) symptomatic epilepsy and epileptic syndromes with complex partial seizures, not intractable, without status epilepticus: Secondary | ICD-10-CM

## 2018-03-12 MED ORDER — ZONISAMIDE 100 MG PO CAPS: 400.0000 mg | ORAL_CAPSULE | Freq: Every day | ORAL | 3 refills | Status: DC

## 2018-03-12 MED ORDER — PHENOBARBITAL 64.8 MG PO TABS: 129.6000 mg | ORAL_TABLET | Freq: Every day | ORAL | 3 refills | Status: DC

## 2018-03-12 NOTE — Telephone Encounter (Signed)
Refill sent, thanks!

## 2018-03-12 NOTE — Telephone Encounter (Signed)
Patient left message with after hours service regarding needing a Prescription for Phenobarbital. Please Call. Thanks

## 2018-03-13 NOTE — Telephone Encounter (Signed)
Phenobarbital 64.8mg  #180 with 3 refills  Sig = take 2 tab daily  Zonisamide 100mg  #360 with 3 refills Sig = take 4 caps daily  Were sent to  [facility] 03/12/2018

## 2018-04-01 ENCOUNTER — Other Ambulatory Visit: Payer: Self-pay

## 2018-04-01 ENCOUNTER — Ambulatory Visit: Payer: Medicare HMO | Admitting: Neurology

## 2018-04-01 ENCOUNTER — Encounter: Payer: Self-pay | Admitting: Neurology

## 2018-04-01 DIAGNOSIS — G40209 Localization-related (focal) (partial) symptomatic epilepsy and epileptic syndromes with complex partial seizures, not intractable, without status epilepticus: Secondary | ICD-10-CM

## 2018-04-01 MED ORDER — PHENOBARBITAL 64.8 MG PO TABS: 129.6000 mg | ORAL_TABLET | Freq: Every day | ORAL | 3 refills | Status: DC

## 2018-04-01 MED ORDER — ZONISAMIDE 100 MG PO CAPS: 400.0000 mg | ORAL_CAPSULE | Freq: Every day | ORAL | 3 refills | Status: DC

## 2018-04-01 NOTE — Patient Instructions (Signed)
Great seeing you! Continue your medications. Follow-up in 1 year, call for any changes  Seizure Precautions: 1. If medication has been prescribed for you to prevent seizures, take it exactly as directed.  Do not stop taking the medicine without talking to your doctor first, even if you have not had a seizure in a long time.   2. Avoid activities in which a seizure would cause danger to yourself or to others.  Don't operate dangerous machinery, swim alone, or climb in high or dangerous places, such as on ladders, roofs, or girders.  Do not drive unless your doctor says you may.  3. If you have any warning that you may have a seizure, lay down in a safe place where you can't hurt yourself.    4.  No driving for 6 months from last seizure, as per [facility].   Please refer to the following link on the [place] website for more information: http://www.epilepsyfoundation.org/answerplace/Social/driving/drivingu.cfm   5.  Maintain good sleep hygiene. Avoid alcohol.  6.  Contact your doctor if you have any problems that may be related to the medicine you are taking.  7.  Call 911 and bring the patient back to the ED if:        A.  The seizure lasts longer than 5 minutes.       B.  The patient doesn't awaken shortly after the seizure  C.  The patient has new problems such as difficulty seeing, speaking or moving  D.  The patient was injured during the seizure  E.  The patient has a temperature over 102 F (39C)  F.  The patient vomited and now is having trouble breathing

## 2018-04-01 NOTE — Progress Notes (Signed)
NEUROLOGY FOLLOW UP OFFICE NOTE  Sherry [name] [id number] 19-Aug-1945  HISTORY OF PRESENT ILLNESS: I had the pleasure of seeing Sherry [name] in follow-up in the neurology clinic on 04/01/2018.  The patient was last seen a year ago for right temporal lobe epilepsy. She is again accompanied by her husband who helps supplement the history today. Since her last visit, she and her husband deny any seizures for 2 years, since Zonisamide dose was increased to 400mg  qhs in April 2018. She is also taking Phenobarbital 64.8mg  2 tabs qhs. No side effects on medications. No convulsions since age 73. Her husband denies any staring/unresponsive episodes. She denies any sick feeling, gaps in time, olfactory/gustatory hallucinations, myoclonic jerks. She has occasional paresthesias in her hands that she attributes to arthritis. No headaches, dizziness, vision changes. No falls. She has a motorized wheelchair and walker to get around the house.   HPI 05/20/2016: This is a pleasant 73 yo RH woman with a history of hypertension, hyperlipidemia, AV block, CAD, and seizures. Records from [facility] were reviewed. She started having seizures in the 40s (age 30s), initially she was having generalized convulsions often preceded by a sensation of severe nausea, "sick to stomach," followed by eyes rolling up, shaking of both arms and legs with tongue bite. They were initially occurring every 2 weeks while on Dilantin, none since adding Phenobarbital. They report the last GTC was probably in her 72s (>10 years ago). Family would also note episodes of unresponsiveness where her head drops forward slightly and she does not respond. She would be amnestic of these and feel like she woke up from sleep. Her daughter previously reported she seemed confused afterwards. There is note that these resolved after phenobarbital reduction in 2073, however her husband reports occasional episodes where he would be talking to her, then he  comes back in the room and she would look at him like they did not talk earlier, she would not understand and say "huh?" and he asks if she heard him. He last noticed this around 72 months ago, prior to that was 7-8 months ago. She continues to have the sensation of a sickening feeling every few months or so, these always seem to occur when she is in the bath. She had one 2 weeks ago, these last up to a minute, she denies any confusion or focal symptoms. She denies any olfactory/gustatory hallucinations, deja vu, focal numbness/tingling/weakness, myoclonic jerks. She notices her hand twitches sometimes. Her husband denies any nocturnal seizures.  She is currently taking Phenobarbital 64.8mg  2 tablets daily and Zonisamide 300mg  qhs. She was previously drowsy on higher doses of Phenobarbital, and with reduction of dose, she currently denies any side effects to her medications. She denies any headaches, dizziness, diplopia, dysarthria/dysphagia, neck/back pain, bladder dysfunction. She has constipation. She needs a walker to ambulate and denies any falls. She denies any focal symptoms but reports tingling in both hands and weakness in both legs, no pain. She reports sleep is good, and reports good compliance to medications. She reports a history of migraines, but these have resolved.  Epilepsy Risk Factors:  A maternal cousin had seizures when younger. Otherwise she had a normal birth and early development.  There is no history of febrile convulsions, CNS infections such as meningitis/encephalitis, significant traumatic brain injury, neurosurgical procedures.  Prior AEDs: Dilantin (ineffective) Laboratory Data: Phenobarbital level 03/13/16 - 28. BMP showed a creatinine of 1.32, BUN 28; vitamin D level in 09/2015 was 25.9 EEG per [facility] notes:  mild diffuse slowing on routine EEG MRI per [facility] notes: showed evidence of old strokes 1-hour EEG: Occasional right temporal slowing, frequent right frontotemporal  epileptiform discharges  PAST MEDICAL HISTORY: Past Medical History:  Diagnosis Date  . Anemia   . Aortic stenosis   . Arthritis   . Bilateral lower extremity edema   . Bradycardia    a. 01/2013 - asymptomatic.  [name] Chronic diastolic heart failure ([place])   . CKD (chronic kidney disease)    a. baseline CKD stage III  . Coronary artery disease   . GERD (gastroesophageal reflux disease)   . Headache(784.0)   . Heart murmur   . Hyperlipidemia   . Hypertension   . Mobitz (type) I (Wenckebach's) atrioventricular block    a. 01/2013 - asymptomatic.  . Morbid obesity ([place])   . Seizure disorder ([place])   . Seizures ([place])   . TIA (transient ischemic attack) 2014   pt stated she had "mini strokes"    MEDICATIONS: Current Outpatient Medications on File Prior to Visit  Medication Sig Dispense Refill  . alendronate (FOSAMAX) 70 MG tablet TAKE 1 TABLET BY MOUTH ONCE WEEKLY WITH A FULL GLASS OF WATER ON AN EMPTY STOMACH. 12 tablet 3  . amLODipine (NORVASC) 10 MG tablet TAKE ONE TABLET BY MOUTH DAILY 90 tablet 3  . aspirin 81 MG EC tablet Take 81 mg by mouth daily.     . Calcium Carb-Cholecalciferol (CALCIUM 500 +D) 500-400 MG-UNIT TABS Take 1 tablet by mouth 2 (two) times daily. 60 tablet 2  . furosemide (LASIX) 40 MG tablet TAKE ONE TABLET BY MOUTH DAILY 90 tablet 2  . lisinopril (PRINIVIL,ZESTRIL) 40 MG tablet TAKE ONE TABLET BY MOUTH DAILY 90 tablet 1  . PHENobarbital (LUMINAL) 64.8 MG tablet Take 2 tablets (129.6 mg total) by mouth daily. 180 tablet 3  . pravastatin (PRAVACHOL) 40 MG tablet Take 1 tablet (40 mg total) by mouth daily. 90 tablet 3  . zonisamide (ZONEGRAN) 100 MG capsule Take 4 capsules (400 mg total) by mouth daily. 360 capsule 3   No current facility-administered medications on file prior to visit.     ALLERGIES: Allergies  Allergen Reactions  . Ibuprofen Other (See Comments)    Dr advised pt not to take ibuprofen    FAMILY HISTORY: Family History  Problem  Relation Age of Onset  . Hypertension Unknown     SOCIAL HISTORY: Social History   Socioeconomic History  . Marital status: Married    Spouse name: Not on file  . Number of children: Not on file  . Years of education: Not on file  . Highest education level: Not on file  Occupational History  . Not on file  Social Needs  . Financial resource strain: Not on file  . Food insecurity:    Worry: Not on file    Inability: Not on file  . Transportation needs:    Medical: Not on file    Non-medical: Not on file  Tobacco Use  . Smoking status: Former Smoker    Types: Cigarettes    Last attempt to quit: 06/03/2000    Years since quitting: 17.8  . Smokeless tobacco: Never Used  Substance and Sexual Activity  . Alcohol use: No    Alcohol/week: 0.0 standard drinks  . Drug use: No  . Sexual activity: Not Currently    Partners: Male  Lifestyle  . Physical activity:    Days per week: Not on file    Minutes per session:  Not on file  . Stress: Not on file  Relationships  . Social connections:    Talks on phone: Not on file    Gets together: Not on file    Attends religious service: Not on file    Active member of club or organization: Not on file    Attends meetings of clubs or organizations: Not on file    Relationship status: Not on file  . Intimate partner violence:    Fear of current or ex partner: Not on file    Emotionally abused: Not on file    Physically abused: Not on file    Forced sexual activity: Not on file  Other Topics Concern  . Not on file  Social History Narrative  . Not on file    REVIEW OF SYSTEMS: Constitutional: No fevers, chills, or sweats, no generalized fatigue, change in appetite Eyes: No visual changes, double vision, eye pain Ear, nose and throat: No hearing loss, ear pain, nasal congestion, sore throat Cardiovascular: No chest pain, palpitations Respiratory:  No shortness of breath at rest or with exertion, wheezes GastrointestinaI: No nausea,  vomiting, diarrhea, abdominal pain, fecal incontinence Genitourinary:  No dysuria, urinary retention or frequency Musculoskeletal:  No neck pain, back pain Integumentary: No rash, pruritus, skin lesions Neurological: as above Psychiatric: No depression, insomnia, anxiety Endocrine: No palpitations, fatigue, diaphoresis, mood swings, change in appetite, change in weight, increased thirst Hematologic/Lymphatic:  No anemia, purpura, petechiae. Allergic/Immunologic: no itchy/runny eyes, nasal congestion, recent allergic reactions, rashes  PHYSICAL EXAM: Vitals:   04/01/18 1009  BP: 126/68  Pulse: (!) 54  SpO2: 100%   General: No acute distress, sitting in wheelchair Head:  Normocephalic/atraumatic Neck: supple, no paraspinal tenderness, full range of motion Heart:  Regular rate and rhythm Lungs:  Clear to auscultation bilaterally Back: No paraspinal tenderness Skin/Extremities: No rash, no edema Neurological Exam: alert and oriented to person, place, and time. No aphasia or dysarthria. Fund of knowledge is appropriate.  Recent and remote memory are intact. Attention and concentration are normal.    Able to name objects and repeat phrases. Cranial nerves: Pupils equal, round, reactive to light.  Extraocular movements intact with no nystagmus. Visual fields full. Facial sensation intact. No facial asymmetry. Tongue, uvula, palate midline.  Motor: Bulk and tone normal, muscle strength 5/5 throughout with no pronator drift.  Sensation to light touch intact.  No extinction to double simultaneous stimulation.  Deep tendon reflexes +2 on both UE, unable to elicit in both LE (similar to prior).  Finger to nose testing intact.  Gait not tested, wheelchair/walker at home, did not bring walker today).  IMPRESSION: This is a pleasant 73 yo RH woman with a history of  hypertension, hyperlipidemia, AV block, CAD, and right temporal lobe epilepsy. No convulsions since age 73, no further partial seizures  since Zonisamide increased to 400mg  qhs in April 2018. Refills for Phenobarbital 64.8mg  2 tablets daily and Zonisamide 400mg  qhs sent today. She does not drive. She will follow-up in 1 year and knows to call for any changes  Thank you for allowing me to participate in her care.  Please do not hesitate to call for any questions or concerns.  The duration of this appointment visit was 15 minutes of face-to-face time with the patient.  Greater than 50% of this time was spent in counseling, explanation of diagnosis, planning of further management, and coordination of care.   Ellouise Newer, M.D.   CC: Dr. Alfonse Spruce

## 2018-04-14 DIAGNOSIS — H269 Unspecified cataract: Secondary | ICD-10-CM | POA: Diagnosis not present

## 2018-04-14 DIAGNOSIS — M810 Age-related osteoporosis without current pathological fracture: Secondary | ICD-10-CM | POA: Diagnosis not present

## 2018-04-14 DIAGNOSIS — R569 Unspecified convulsions: Secondary | ICD-10-CM | POA: Diagnosis not present

## 2018-04-14 DIAGNOSIS — I110 Hypertensive heart disease with heart failure: Secondary | ICD-10-CM | POA: Diagnosis not present

## 2018-04-14 DIAGNOSIS — Z823 Family history of stroke: Secondary | ICD-10-CM | POA: Diagnosis not present

## 2018-04-14 DIAGNOSIS — Z7982 Long term (current) use of aspirin: Secondary | ICD-10-CM | POA: Diagnosis not present

## 2018-04-14 DIAGNOSIS — I509 Heart failure, unspecified: Secondary | ICD-10-CM | POA: Diagnosis not present

## 2018-04-14 DIAGNOSIS — Z7983 Long term (current) use of bisphosphonates: Secondary | ICD-10-CM | POA: Diagnosis not present

## 2018-04-14 DIAGNOSIS — E785 Hyperlipidemia, unspecified: Secondary | ICD-10-CM | POA: Diagnosis not present

## 2018-04-14 DIAGNOSIS — Z8249 Family history of ischemic heart disease and other diseases of the circulatory system: Secondary | ICD-10-CM | POA: Diagnosis not present

## 2018-04-20 ENCOUNTER — Encounter: Payer: Self-pay | Admitting: Thoracic Surgery (Cardiothoracic Vascular Surgery)

## 2018-04-20 ENCOUNTER — Ambulatory Visit (INDEPENDENT_AMBULATORY_CARE_PROVIDER_SITE_OTHER): Payer: Medicare HMO | Admitting: Thoracic Surgery (Cardiothoracic Vascular Surgery)

## 2018-04-20 VITALS — BP 130/66 | HR 52 | Resp 20 | Ht 63.0 in | Wt 132.0 lb

## 2018-04-20 DIAGNOSIS — I350 Nonrheumatic aortic (valve) stenosis: Secondary | ICD-10-CM

## 2018-04-20 NOTE — Progress Notes (Signed)
[address]       [place] [address]             [phone]     CARDIOTHORACIC SURGERY OFFICE NOTE  Primary Cardiologist is Sherren Mocha, MD PCP is [name], MD   HPI:  Patient is a 73 year old African-American female with multiple medical problems who returns to the office today for follow-up of aortic stenosis. She originally presented with symptoms of dizziness and syncope associated with intermittent 21 AV block. She was felt to have Mobitz type I AV block and a decision was made to observe her without pacemaker placement. Her symptoms resolved following adjustments in her medical therapy. Transthoracic echocardiograms have documented the presence of normal left ventricular systolic function with severe left ventricular hypertrophy and moderate aortic stenosis. I first had the opportunity to see her in consultation in March 2015. Since then she has followed up regularly with Dr. Burt Knack and she was last seen in our office on 04/21/2017.  Since then she was seen in follow-up by Dr. Burt Knack December 05, 2017.  At that time she continued to deny any symptoms of congestive heart failure.  She subsequently underwent cardiac MRI December 24, 2017 which suggested the possibility of cardiac amyloidosis.  There was also a moderate pericardial effusion without signs of tamponade.  Left ventricular ejection fraction was measured 76%.  She has not had a follow-up echocardiogram within the past year but one has been scheduled for early next month.    Patient returns to our office today with her husband present.  She remains cheerful and she specifically denies any symptoms of worsening shortness of breath or chest discomfort.  She has very limited mobility because of chronic weakness in her legs, so she admits that she does not get around too much but she notes that this has not gotten any worse.  She denies any significant lower extremity edema, orthopnea, or PND.  She has  never had any chest pain or chest tightness.  She has not had any palpitations, dizzy spells, or syncope.  She reports that her weight has been stable.   Current Outpatient Medications  Medication Sig Dispense Refill  . alendronate (FOSAMAX) 70 MG tablet TAKE 1 TABLET BY MOUTH ONCE WEEKLY WITH A FULL GLASS OF WATER ON AN EMPTY STOMACH. 12 tablet 3  . amLODipine (NORVASC) 10 MG tablet TAKE ONE TABLET BY MOUTH DAILY 90 tablet 3  . aspirin 81 MG EC tablet Take 81 mg by mouth daily.     . Calcium Carb-Cholecalciferol (CALCIUM 500 +D) 500-400 MG-UNIT TABS Take 1 tablet by mouth 2 (two) times daily. 60 tablet 2  . furosemide (LASIX) 40 MG tablet TAKE ONE TABLET BY MOUTH DAILY 90 tablet 2  . lisinopril (PRINIVIL,ZESTRIL) 40 MG tablet TAKE ONE TABLET BY MOUTH DAILY 90 tablet 1  . PHENobarbital (LUMINAL) 64.8 MG tablet Take 2 tablets (129.6 mg total) by mouth daily. 180 tablet 3  . pravastatin (PRAVACHOL) 40 MG tablet Take 1 tablet (40 mg total) by mouth daily. 90 tablet 3  . zonisamide (ZONEGRAN) 100 MG capsule Take 4 capsules (400 mg total) by mouth daily. 360 capsule 3   No current facility-administered medications for this visit.       Physical Exam:   BP 130/66   Pulse (!) 52   Resp 20   Ht 5\' 3"  (1.6 m)   Wt 132 lb (59.9 kg)   SpO2 100% Comment: RA  BMI  23.38 kg/m   General:  Elderly and frail-appearing  Chest:   Clear to auscultation  CV:   Regular rate and rhythm with prominent grade 3/6 harsh crescendo decrescendo systolic murmur heard best at the sternal border  Incisions:  n/a  Abdomen:  Soft nontender  Extremities:  Warm and well-perfused  Diagnostic Tests:  CARDIAC MRI  TECHNIQUE: The patient was scanned on a 1.5 Tesla GE magnet. A dedicated cardiac coil was used. Functional imaging was done using Fiesta sequences. 2,3, and 4 chamber views were done to assess for RWMA's. Modified Simpson's rule using a short axis stack was used to calculate an ejection fraction on a  dedicated work Conservation officer, nature. The patient received 7 cc of Gadavist. After 10 minutes inversion recovery sequences were used to assess for infiltration and scar tissue.  CONTRAST:  7 cc Gadavist  FINDINGS: Limited images of the lung fields showed no gross abnormalities.  There was a moderate, circumferential pericardial effusion without evidence for tamponade.  There was normal left ventricular size with moderate concentric LV hypertrophy, EF 76% with no wall motion abnormalities. Normal right ventricular size and systolic function, EF 65%. Mild left atrial enlargement. Normal right atrial size. There was mitral annular calcification with mild mitral regurgitation. The aortic valve was calcified and restricted. Degree of stenosis is better estimated by echo.  On delayed enhancement imaging, the myocardium was difficult to null. There was a suggestion of diffuse subendocardial late gadolinium enhancement (LGE).  Measurements:  LVEDV 155 mL  LVSV 118 mL  LVEF 76%  RVEDV 124 mL  RVSV 57 mL  RVEF 46%  IMPRESSION: 1. This study is suggestive of cardiac amyloidosis. There is a moderate pericardial effusion (without tamponade), moderate concentric LV hypertrophy, and on delayed enhancement imaging, the myocardium is difficult to null with a suggestion of diffuse subendocardial LGE.  2. There is aortic stenosis present, the degree would be better assessed by echo.  Dalton Mclean   Electronically Signed   By: Loralie Champagne M.D.   On: 12/24/2017 21:26   Impression:  Patient has known history of moderate aortic stenosis, nonobstructive coronary artery disease, and chronic diastolic congestive heart failure.  She remains clinically stable and essentially asymptomatic, although admittedly the patient is not active physically.  Recent cardiac MRI suggests presence of cardiac amyloidosis.  She is due a follow-up echocardiogram which has  been scheduled for next month.   Plan:  We have not recommended any changes to the patient's current medications.  I agree with plans outlined previously by Dr. Burt Knack.  If the patient's aortic stenosis is noted to progress to any significant degree, transcatheter aortic valve replacement could be considered.  I would be very reluctant to consider this patient a candidate for conventional surgery under any circumstances.   I spent in excess of 15 minutes during the conduct of this office consultation and >50% of this time involved direct face-to-face encounter with the patient for counseling and/or coordination of their care.   Valentina Gu. Roxy Manns, MD 04/20/2018 9:36 AM

## 2018-04-20 NOTE — Patient Instructions (Signed)
Continue all previous medications without any changes at this time  

## 2018-04-27 ENCOUNTER — Ambulatory Visit: Payer: Medicare HMO | Admitting: Cardiovascular Disease

## 2018-05-01 ENCOUNTER — Other Ambulatory Visit: Payer: Self-pay | Admitting: Internal Medicine

## 2018-05-15 ENCOUNTER — Telehealth: Payer: Self-pay

## 2018-05-15 NOTE — Telephone Encounter (Signed)
Due to COVID-19 pandemic, the patient wishes to postpone her 4/1 echo and office visit. She understands she will be called to arrange appointment when restrictions are lifted.  She was grateful for assistance.

## 2018-05-20 ENCOUNTER — Ambulatory Visit: Payer: Medicare HMO | Admitting: Cardiovascular Disease

## 2018-05-20 ENCOUNTER — Ambulatory Visit (HOSPITAL_COMMUNITY): Payer: Medicare HMO

## 2018-05-26 ENCOUNTER — Other Ambulatory Visit: Payer: Self-pay

## 2018-05-26 ENCOUNTER — Ambulatory Visit (INDEPENDENT_AMBULATORY_CARE_PROVIDER_SITE_OTHER): Payer: Medicare HMO | Admitting: Internal Medicine

## 2018-05-26 DIAGNOSIS — G40909 Epilepsy, unspecified, not intractable, without status epilepticus: Secondary | ICD-10-CM | POA: Diagnosis not present

## 2018-05-26 DIAGNOSIS — I130 Hypertensive heart and chronic kidney disease with heart failure and stage 1 through stage 4 chronic kidney disease, or unspecified chronic kidney disease: Secondary | ICD-10-CM | POA: Diagnosis not present

## 2018-05-26 DIAGNOSIS — N183 Chronic kidney disease, stage 3 unspecified: Secondary | ICD-10-CM

## 2018-05-26 DIAGNOSIS — I10 Essential (primary) hypertension: Secondary | ICD-10-CM

## 2018-05-26 DIAGNOSIS — I5032 Chronic diastolic (congestive) heart failure: Secondary | ICD-10-CM | POA: Diagnosis not present

## 2018-05-26 NOTE — Progress Notes (Deleted)
   CC: ***  This is a telephone encounter between Sherry [name] and Sherry [name] on 05/26/2018 for ***. The visit was conducted with the patient located at {NAMES:[id number]::"home"} and Sherry [name] at [employer]. The patient's identity was confirmed using their DOB and current address. The {WHO:3044014::"patient","his/her legal guardian","***"} has consented to being evaluated through a telephone encounter and understands the associated risks (an examination cannot be done and the patient may need to come in for an appointment) / benefits (allows the patient to remain at home, decreasing exposure to coronavirus). I personally spent {Numbers; [id number]} minutes on medical discussion.   HPI:  [name] is a 73 y.o. with chronic diastolic congestive heart failure, second-degree AV heart block, aortic stenosis, multivessel coronary artery disease, GERD, osteoporosis, and CKD stage III.  Please see A&P for assessment of the patient's acute and chronic medical conditions.   Epilepsy: She takes phenobarbital and zonisamide without issues.  Last phenobarbital level was on 07/25/2017 was within normal limits.  CKD stage III: Stable.  Creatinine on 12/24/2017 was 1.32 and at her baseline.  Chronic diastolic congestive heart failure: [name] has had an extensive work-up of her heart failure including a cardiac MRI which suggested presence of cardiac amyloidosis.  She h had a follow-up echo scheduled for this month but this has not been performed.  Her cardiologist has recommended continuing medical management with amlodipine, lisinopril, and furosemide.  She denies orthopnea, PND, shortness of breath, chest pain, palpitations, weight gain.  Hypertension: Currently taking amlodipine, lisinopril, Lasix.  Care maintenance: Mammogram?  Past Medical History:  Diagnosis Date  . Anemia   . Aortic stenosis   . Arthritis   . Bilateral lower extremity edema    . Bradycardia    a. 01/2013 - asymptomatic.  [name] Chronic diastolic heart failure ([place])   . CKD (chronic kidney disease)    a. baseline CKD stage III  . Coronary artery disease   . GERD (gastroesophageal reflux disease)   . Headache(784.0)   . Heart murmur   . Hyperlipidemia   . Hypertension   . Mobitz (type) I (Wenckebach's) atrioventricular block    a. 01/2013 - asymptomatic.  . Morbid obesity ([place])   . Seizure disorder ([place])   . Seizures ([place])   . TIA (transient ischemic attack) 2014   pt stated she had "mini strokes"   Review of Systems:  ***    Assessment & Plan:   See Encounters Tab for problem based charting.  Patient {GC/GE:3044014::"discussed with","seen with"} Dr. {NAMES:[id number]::"Butcher","Granfortuna","E. Hoffman","Klima","Mullen","Narendra","Raines","Vincent"}

## 2018-05-27 ENCOUNTER — Encounter: Payer: Medicare HMO | Admitting: Internal Medicine

## 2018-05-28 NOTE — Assessment & Plan Note (Signed)
Assessment: She takes phenobarbital and zonisamide without issue.  She recently saw her neurologist that recommended continuing these medications.  She denies any recent seizure activity.  Plan:  1.  Continue phenobarbital and zonisamide.

## 2018-05-28 NOTE — Progress Notes (Signed)
   CC: follow-up of her chronic medical conditions  This is a telephone encounter between Sherry [name] and Sherry [name] on 05/28/2018 for follow-up of her chronic medical conditions. The visit was conducted with the patient located at home and Sherry [name] at [facility]. The patient's identity was confirmed using their DOB and current address. The patient has consented to being evaluated through a telephone encounter and understands the associated risks (an examination cannot be done and the patient may need to come in for an appointment) / benefits (allows the patient to remain at home, decreasing exposure to coronavirus). I personally spent 30 minutes on medical discussion.   HPI:  [name] is a 73 y.o. with PMH as below.   Please see A&P for assessment of the patient's acute and chronic medical conditions.   Past Medical History:  Diagnosis Date  . Anemia   . Aortic stenosis   . Arthritis   . Bilateral lower extremity edema   . Bradycardia    a. 01/2013 - asymptomatic.  [name] Chronic diastolic heart failure ([place])   . CKD (chronic kidney disease)    a. baseline CKD stage III  . Coronary artery disease   . GERD (gastroesophageal reflux disease)   . Headache(784.0)   . Heart murmur   . Hyperlipidemia   . Hypertension   . Mobitz (type) I (Wenckebach's) atrioventricular block    a. 01/2013 - asymptomatic.  . Morbid obesity ([place])   . Seizure disorder ([place])   . Seizures ([place])   . TIA (transient ischemic attack) 2014   pt stated she had "mini strokes"   Review of Systems:   Review of Systems  Constitutional: Negative for chills and fever.  Respiratory: Negative for cough and shortness of breath.   Cardiovascular: Negative for chest pain, palpitations, leg swelling and PND.  Gastrointestinal: Negative for abdominal pain, diarrhea, nausea and vomiting.  Neurological: Negative for dizziness and headaches.  All other systems reviewed and are negative.    Assessment  & Plan:   See Encounters Tab for problem based charting.  Patient seen with Dr. Beryle Beams

## 2018-05-28 NOTE — Assessment & Plan Note (Signed)
Assessment/plan: Last creatinine was 1.32 on 12/24/2017.  At her baseline.  We will plan on repeating BMP at next in-person clinic visit.

## 2018-05-28 NOTE — Assessment & Plan Note (Signed)
Assessment: Sherry [name] states that she has been taking her amlodipine, lisinopril, and Lasix medications without difficulty.  He has not checked her blood pressure at home.  She does not need any refills today.  Plan: 1.  Continue amlodipine 10 mg daily, lisinopril 40 mg daily, and Lasix 40 mg daily 2.  Check blood pressure at next in person appointment

## 2018-05-28 NOTE — Assessment & Plan Note (Signed)
Assessment: [name] had a recent myocardial amyloid scan which showed evidence of cardiac amyloidosis.  She has been following with her cardiologist who recommends continuing medical management with amlodipine, lisinopril, furosemide.  He denies orthopnea, PND, shortness of breath, chest pain, lower extremity edema, weight gain, and palpitations.  Plan: 1.  Continue amlodipine 10 mg daily, lisinopril 40 mg daily, furosemide 40 mg daily

## 2018-06-01 NOTE — Progress Notes (Signed)
Medicine attending: Medical history, presenting problems,  and medications, reviewed with resident physician Dr Nita Sickle on the day of the patient telephone consultation and I concur with her evaluation and management plan. Medically complex woman w HTN, amyloid TTR? Cardiomyopathy, seizure disorder. Stable at present. Continue current med regimen.

## 2018-06-02 NOTE — Telephone Encounter (Signed)
Scheduled patient 8/31 for echo and subsequent visit with Dr. Burt Knack. She was grateful for call and agrees with treatment plan.

## 2018-06-08 ENCOUNTER — Other Ambulatory Visit: Payer: Self-pay | Admitting: Neurology

## 2018-06-08 DIAGNOSIS — G40209 Localization-related (focal) (partial) symptomatic epilepsy and epileptic syndromes with complex partial seizures, not intractable, without status epilepticus: Secondary | ICD-10-CM

## 2018-06-18 ENCOUNTER — Other Ambulatory Visit: Payer: Self-pay | Admitting: Neurology

## 2018-06-18 DIAGNOSIS — G40209 Localization-related (focal) (partial) symptomatic epilepsy and epileptic syndromes with complex partial seizures, not intractable, without status epilepticus: Secondary | ICD-10-CM

## 2018-06-19 ENCOUNTER — Other Ambulatory Visit: Payer: Self-pay | Admitting: *Deleted

## 2018-06-19 DIAGNOSIS — M810 Age-related osteoporosis without current pathological fracture: Secondary | ICD-10-CM

## 2018-06-19 MED ORDER — ALENDRONATE SODIUM 70 MG PO TABS: ORAL_TABLET | ORAL | 1 refills | Status: DC

## 2018-06-24 ENCOUNTER — Other Ambulatory Visit: Payer: Self-pay

## 2018-06-24 NOTE — Telephone Encounter (Signed)
Called pt, informed her dr Delice Lesch had sent refills to [facility], ask her to call dr aquino's office for problems w/ zonegran, she is agreeable

## 2018-06-24 NOTE — Telephone Encounter (Signed)
zonisamide (ZONEGRAN) 100 MG capsule, refill request @  [facility], [place] AT [address] [phone] (Phone) [phone] (Fax)

## 2018-08-09 ENCOUNTER — Encounter: Payer: Self-pay | Admitting: *Deleted

## 2018-09-04 ENCOUNTER — Other Ambulatory Visit: Payer: Self-pay | Admitting: *Deleted

## 2018-09-04 DIAGNOSIS — I5032 Chronic diastolic (congestive) heart failure: Secondary | ICD-10-CM

## 2018-09-04 MED ORDER — FUROSEMIDE 40 MG PO TABS: 40.0000 mg | ORAL_TABLET | Freq: Every day | ORAL | 2 refills | Status: DC

## 2018-09-04 NOTE — Telephone Encounter (Signed)
Next appt scheduled 7/29 with PCP. 

## 2018-09-16 ENCOUNTER — Other Ambulatory Visit: Payer: Self-pay

## 2018-09-16 ENCOUNTER — Encounter: Payer: Self-pay | Admitting: Internal Medicine

## 2018-09-16 ENCOUNTER — Ambulatory Visit (INDEPENDENT_AMBULATORY_CARE_PROVIDER_SITE_OTHER): Payer: Medicare HMO | Admitting: Internal Medicine

## 2018-09-16 VITALS — BP 174/55 | HR 45 | Temp 97.9°F | Wt 142.2 lb

## 2018-09-16 DIAGNOSIS — R011 Cardiac murmur, unspecified: Secondary | ICD-10-CM

## 2018-09-16 DIAGNOSIS — I440 Atrioventricular block, first degree: Secondary | ICD-10-CM

## 2018-09-16 DIAGNOSIS — I10 Essential (primary) hypertension: Secondary | ICD-10-CM

## 2018-09-16 DIAGNOSIS — I350 Nonrheumatic aortic (valve) stenosis: Secondary | ICD-10-CM

## 2018-09-16 DIAGNOSIS — M170 Bilateral primary osteoarthritis of knee: Secondary | ICD-10-CM | POA: Diagnosis not present

## 2018-09-16 DIAGNOSIS — G40909 Epilepsy, unspecified, not intractable, without status epilepticus: Secondary | ICD-10-CM | POA: Diagnosis not present

## 2018-09-16 DIAGNOSIS — R269 Unspecified abnormalities of gait and mobility: Secondary | ICD-10-CM

## 2018-09-16 DIAGNOSIS — Z993 Dependence on wheelchair: Secondary | ICD-10-CM | POA: Diagnosis not present

## 2018-09-16 DIAGNOSIS — Z79899 Other long term (current) drug therapy: Secondary | ICD-10-CM

## 2018-09-16 DIAGNOSIS — R531 Weakness: Secondary | ICD-10-CM

## 2018-09-16 DIAGNOSIS — M47816 Spondylosis without myelopathy or radiculopathy, lumbar region: Secondary | ICD-10-CM

## 2018-09-16 MED ORDER — LISINOPRIL 40 MG PO TABS: 40.0000 mg | ORAL_TABLET | Freq: Every day | ORAL | 1 refills | Status: DC

## 2018-09-16 NOTE — Assessment & Plan Note (Addendum)
She states she needs a new motorized wheelchair, she has had hers for the past ten years, and it is beginning to come apart. She requires motorized wheelchair due to knee osteoarthritis and diffuse LE weakness with some UE weakness as well as diffuse weakness secondary to comorbidities including severe aortic stenosis with heart failure. Scooter would be inappropriate as she has some difficulty transferring and wheelchair provides her more support. 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 She uses the wheelchair for all her activities of daily living. She has tried using a walker in the past and can barely walk with this.   - f/u two weeks for paperwork

## 2018-09-16 NOTE — Assessment & Plan Note (Signed)
Cardiac MRI 12/24/2017 showed evidence of cardiac amyloidosis although. Myocardial amyloid spect imaging done 01/09/2018 showed no evidence of TRR amyloidosis. She has moderate-severe aortic stenosis and repeat ECHO planned for 04/2018 unable to be done due to covid-19. She has been following with her cardiologist who recommends continuing medical management with amlodipine, lisinopril, furosemide. Blood pressure today elevated to 175/55 and similar findings on repeat. She has significant aortic stenosis on PE, baseline murmur appears to be III/VI, but I am uncertain if this has increased. She denies orthopnea, PND, shortness of breath, chest pain, lower extremity edema, weight gain, and palpitations.  - orthostatics normal - chronic bradycardia with 1st degree av block present - will complete echo earlier and have her f/u in two weeks for repeat blood pressure check - will hold change in bp medication at this time and continue current medications.  - given strict return precautions

## 2018-09-16 NOTE — Progress Notes (Addendum)
   CC: BMP   HPI:  [name] is a 73 y.o. with PMH as below.   Please see A&P for assessment of the patient's acute and chronic medical conditions.   Past Medical History:  Diagnosis Date  . Anemia   . Aortic stenosis   . Arthritis   . Bilateral lower extremity edema   . Bradycardia    a. 01/2013 - asymptomatic.  [name] Chronic diastolic heart failure ([place])   . CKD (chronic kidney disease)    a. baseline CKD stage III  . Coronary artery disease   . GERD (gastroesophageal reflux disease)   . Headache(784.0)   . Heart block AV second degree 02/15/2013  . Heart murmur   . Hyperlipidemia   . Hypertension   . Mobitz (type) I (Wenckebach's) atrioventricular block    a. 01/2013 - asymptomatic.  . Morbid obesity ([place])   . Seizure disorder ([place])   . Seizures ([place])   . TIA (transient ischemic attack) 2014   pt stated she had "mini strokes"   Review of Systems:   Review of Systems  Respiratory: Negative for cough, shortness of breath and wheezing.   Cardiovascular: Negative for chest pain, palpitations, orthopnea and leg swelling.  Gastrointestinal: Negative for constipation and nausea.  Genitourinary: Negative for dysuria, frequency and urgency.  Neurological: Positive for weakness. Negative for dizziness and focal weakness.   Physical Exam:  Constitution: NAD, wheelchair bound Cardio: IV/VI systolic murmur RUSB, bradycardic, irregular rhythm; no LE edema Respiratory: CTAB, no wheezing rales rhonchi  MSK: LE strength 3/5, UE strength 4/5, difficulty extending at the knee Skin: c/d/i    Vitals:   09/16/18 0944 09/16/18 1003  BP: (!) 175/65 (!) 174/55  Pulse: (!) 51 (!) 45  Temp: 97.9 F (36.6 C)   TempSrc: Oral   SpO2: 100%   Weight: 142 lb 3.2 oz (64.5 kg)      Assessment & Plan:   See Encounters Tab for problem based charting.  Patient discussed with Dr. Dareen Piano

## 2018-09-16 NOTE — Assessment & Plan Note (Addendum)
BP Readings from Last 3 Encounters:  09/16/18 (!) 174/55  04/20/18 130/66  04/01/18 126/68   She states she has been taking her medications daily. Obtaining echo with history of aortic stenosis and will f/u in two weeks for repeat blood pressure check.   - continue amlodipine 10 mg qd, lisinopril 10 mg qd and lasix 40 mg qd  - f/u two weeks blood pressure check after echo

## 2018-09-16 NOTE — Assessment & Plan Note (Deleted)
Cardiac MRI 12/24/2017 showed evidence of cardiac amyloidosis although. Myocardial amyloid spect imaging done 01/09/2018 showed no evidence of TRR amyloidosis. She has moderate-severe aortic stenosis and repeat ECHO planned for 04/2018 unable to be done due to covid-19. She has been following with her cardiologist who recommends continuing medical management with amlodipine, lisinopril, furosemide. Blood pressure today elevated to 175/55 and similar findings on repeat. She has significant aortic stenosis on PE, baseline murmur appears to be III/VI, but I am uncertain if this has increased. She denies orthopnea, PND, shortness of breath, chest pain, lower extremity edema, weight gain, and palpitations.  - orthostatics normal - chronic bradycardia with 1st degree av block present - will complete echo earlier and have her f/u in two weeks for repeat blood pressure check - will hold change in bp medication at this time and continue current medications.  - given strict return precautions

## 2018-09-16 NOTE — Assessment & Plan Note (Signed)
This problem is chronic and stable, no recent seizure activity. She follows up with neurology in one month.   - continue phenobarbital 64.8 mg qd - continue zonegran 100 mg 4 capsules daily

## 2018-09-16 NOTE — Patient Instructions (Signed)
Thank you for allowing [place] to provide your care today. Today we discussed your aortic stenosis and hypertension.     I have ordered the following labs for you:  Basic metabolic panel   I will call if any are abnormal.    I have also reordered an Echocardiogram for you. Please complete this as soon as possible and then follow-up in clinic for blood pressure check in two weeks.   Should you have any questions or concerns please call the internal medicine clinic at [phone].

## 2018-09-17 ENCOUNTER — Other Ambulatory Visit: Payer: Self-pay | Admitting: Internal Medicine

## 2018-09-17 LAB — BMP8+ANION GAP
Anion Gap: 19 mmol/L — ABNORMAL HIGH (ref 10.0–18.0)
BUN/Creatinine Ratio: 25 (ref 12–28)
BUN: 38 mg/dL — ABNORMAL HIGH (ref 8–27)
CO2: 17 mmol/L — ABNORMAL LOW (ref 20–29)
Calcium: 8.5 mg/dL — ABNORMAL LOW (ref 8.7–10.3)
Chloride: 105 mmol/L (ref 96–106)
Creatinine, Ser: 1.5 mg/dL — ABNORMAL HIGH (ref 0.57–1.00)
GFR calc Af Amer: 40 mL/min/{1.73_m2} — ABNORMAL LOW (ref >59)
GFR calc non Af Amer: 35 mL/min/{1.73_m2} — ABNORMAL LOW (ref >59)
Glucose: 72 mg/dL (ref 65–99)
Potassium: 4.7 mmol/L (ref 3.5–5.2)
Sodium: 141 mmol/L (ref 134–144)

## 2018-09-17 NOTE — Telephone Encounter (Signed)
Needs refill on furosemide (LASIX) 40 MG tablet,   zonisamide (ZONEGRAN) 100 MG capsule     ;pt contact [place], [place]

## 2018-09-17 NOTE — Telephone Encounter (Signed)
Call made to pharmacy-patient has lasix refill on file.  Zonisamide filled by neurology and pharmacy will contact that office for refill.  Pt aware.  No further action needed, phone call complete.Despina Hidden Cassady7/30/202012:03 PM

## 2018-09-18 NOTE — Progress Notes (Signed)
Internal Medicine Clinic Attending  Case discussed with Dr. Seawell at the time of the visit.  We reviewed the resident's history and exam and pertinent patient test results.  I agree with the assessment, diagnosis, and plan of care documented in the resident's note.    

## 2018-09-23 ENCOUNTER — Telehealth: Payer: Self-pay | Admitting: *Deleted

## 2018-09-23 DIAGNOSIS — R269 Unspecified abnormalities of gait and mobility: Secondary | ICD-10-CM

## 2018-09-23 DIAGNOSIS — M47816 Spondylosis without myelopathy or radiculopathy, lumbar region: Secondary | ICD-10-CM

## 2018-09-23 NOTE — Telephone Encounter (Signed)
RE: Power wheelchair Received: 2 days ago [place], [place], [place], [place] will fax you an order to get signed for power wheelchair to begin the process. Please fax it back to [phone] as well as patient's demographics.   Thank you!   Previous Messages  ----- Message -----  From: Marcelino Duster, CMA  Sent: 09/16/2018 10:52 AM EDT  To: Shela Nevin  Subject: Power wheelchair                 Good Morning Debbie,  We have a patient who is currently using a motorized w/c chair that is "falling apart". Pt states she has had the chair for about 10 years, but cant remember which agency provided it. Can you please assist [place] in helping her obtain a new one?   Thank you  Saadiya Wilfong "[facility]  [phone]

## 2018-10-01 NOTE — Telephone Encounter (Signed)
RE: Power wheelchair Received: Today Message Contents  Sherry [name], Sherry [name], Sherry [name] has been placed in epic.   Thank you.   Previous Messages  ----- Message -----  From: Sherry [name]  Sent: 10/01/2018 11:45 AM EDT  To: Sherry [name], CMA  Subject: FW: Power wheelchair               I have faxed this order to you to be completed for Power Wheelchair or you can add the Order in [place] and I will pull it but if you add it, please let me know so I know to pull it.   Thank you!

## 2018-10-07 ENCOUNTER — Other Ambulatory Visit: Payer: Self-pay

## 2018-10-07 ENCOUNTER — Ambulatory Visit (HOSPITAL_COMMUNITY)
Admission: RE | Admit: 2018-10-07 | Discharge: 2018-10-07 | Disposition: A | Discharge status: Home / Self Care | Payer: Medicare HMO | Source: Ambulatory Visit | Attending: Cardiovascular Disease | Admitting: Cardiovascular Disease

## 2018-10-07 ENCOUNTER — Ambulatory Visit (INDEPENDENT_AMBULATORY_CARE_PROVIDER_SITE_OTHER): Payer: Medicare HMO | Admitting: Internal Medicine

## 2018-10-07 ENCOUNTER — Encounter: Payer: Self-pay | Admitting: Internal Medicine

## 2018-10-07 VITALS — BP 158/69 | HR 45 | Temp 97.6°F | Wt 140.0 lb

## 2018-10-07 DIAGNOSIS — E785 Hyperlipidemia, unspecified: Secondary | ICD-10-CM | POA: Insufficient documentation

## 2018-10-07 DIAGNOSIS — I5032 Chronic diastolic (congestive) heart failure: Secondary | ICD-10-CM | POA: Diagnosis not present

## 2018-10-07 DIAGNOSIS — I2510 Atherosclerotic heart disease of native coronary artery without angina pectoris: Secondary | ICD-10-CM | POA: Diagnosis not present

## 2018-10-07 DIAGNOSIS — I10 Essential (primary) hypertension: Secondary | ICD-10-CM | POA: Diagnosis not present

## 2018-10-07 DIAGNOSIS — Z993 Dependence on wheelchair: Secondary | ICD-10-CM | POA: Diagnosis not present

## 2018-10-07 DIAGNOSIS — I110 Hypertensive heart disease with heart failure: Secondary | ICD-10-CM | POA: Insufficient documentation

## 2018-10-07 DIAGNOSIS — Z79899 Other long term (current) drug therapy: Secondary | ICD-10-CM | POA: Diagnosis not present

## 2018-10-07 DIAGNOSIS — I313 Pericardial effusion (noninflammatory): Secondary | ICD-10-CM | POA: Diagnosis not present

## 2018-10-07 DIAGNOSIS — I350 Nonrheumatic aortic (valve) stenosis: Secondary | ICD-10-CM | POA: Insufficient documentation

## 2018-10-07 DIAGNOSIS — R011 Cardiac murmur, unspecified: Secondary | ICD-10-CM | POA: Diagnosis not present

## 2018-10-07 DIAGNOSIS — I440 Atrioventricular block, first degree: Secondary | ICD-10-CM | POA: Diagnosis not present

## 2018-10-07 LAB — ECHOCARDIOGRAM COMPLETE: Weight: 2240 oz

## 2018-10-07 MED ORDER — AMLODIPINE BESYLATE 10 MG PO TABS: 10.0000 mg | ORAL_TABLET | Freq: Every day | ORAL | 3 refills | Status: DC

## 2018-10-07 NOTE — Progress Notes (Signed)
  Echocardiogram 2D Echocardiogram has been performed.  Matilde Bash 10/07/2018, 10:55 AM

## 2018-10-07 NOTE — Assessment & Plan Note (Signed)
HR 45 today. This is a chronic and stable problem. Asymptomatic. Echo scheduled today after appointment.

## 2018-10-07 NOTE — Assessment & Plan Note (Signed)
Planned to have f/u after echo but she has not been able to complete this yet. We were able to move her appointment to right after her appointment today.   - f/u echo  - f/u cardiology 8/31

## 2018-10-07 NOTE — Assessment & Plan Note (Addendum)
BP Readings from Last 3 Encounters:  10/07/18 (!) 158/69  09/16/18 (!) 174/55  04/20/18 130/66   Curent medications include amlodipine 10 mg qd, lisinopril 10 mg qd, and lasix 40 mg qd. During last appointment blood pressure elevated. Prior to changing blood pressure medications we had planned for repeat echo with her severe aortic stenosis which has not yet been completed. We were able to move echo to directly after appointment today. Blood pressure slightly improved and will wait for results.   ADDENDUM: Echo demonstrates severe AV stenosis with area of .99 from 1.27 in 2018. Severe hypertrophy with LV cavity size decreased. EF 60%. Cardiology aware and plan to discuss with valve team at f/u appoint on 8/31.   - will discuss bp medications after echo  - f/u three months for BMP  - f/u cardiology

## 2018-10-07 NOTE — Progress Notes (Signed)
   CC: hypertension  HPI:  Sherry [name] is a 73 y.o. with PMH as below presenting for follow-up blood pressure check and for aortic stenosis.   Please see A&P for assessment of the patient's acute and chronic medical conditions.   Past Medical History:  Diagnosis Date  . Anemia   . Aortic stenosis   . Arthritis   . Bilateral lower extremity edema   . Bradycardia    a. 01/2013 - asymptomatic.  [name] Chronic diastolic heart failure ([place])   . CKD (chronic kidney disease)    a. baseline CKD stage III  . Coronary artery disease   . GERD (gastroesophageal reflux disease)   . Headache(784.0)   . Heart block AV second degree 02/15/2013  . Heart murmur   . Hyperlipidemia   . Hypertension   . Mobitz (type) I (Wenckebach's) atrioventricular block    a. 01/2013 - asymptomatic.  . Morbid obesity ([place])   . Seizure disorder ([place])   . Seizures ([place])   . TIA (transient ischemic attack) 2014   pt stated she had "mini strokes"   Review of Systems:   Review of Systems  Respiratory: Negative for cough and shortness of breath.   Cardiovascular: Negative for chest pain, palpitations and leg swelling.  Neurological: Negative for dizziness and focal weakness.    Physical Exam:  Constitution: NAD, wheelchair bound Cardio: bradycardic, IV/VI systolic murmur RUSB Respiratory: CTA, no w/r/r :   Vitals:   10/07/18 0922  BP: (!) 158/69  Pulse: (!) 45  Temp: 97.6 F (36.4 C)  TempSrc: Oral  SpO2: (!) 48%  Weight: 140 lb (63.5 kg)     Assessment & Plan:   See Encounters Tab for problem based charting.  Patient discussed with Dr. Dareen Piano

## 2018-10-07 NOTE — Patient Instructions (Addendum)
Thank you for allowing [place] to provide your care today. Today we discussed your needed echocardiogram.    I will call you with the results.   Please make sure to follow-up with your cardiologist.   Please follow-up in three months to recheck kidney function.   Please call the internal medicine center clinic if you have any questions or concerns, we may be able to help and keep you from a long and expensive emergency room wait. Our clinic and after hours phone number is [phone], the best time to call is Monday through Friday 9 am to 4 pm but there is always someone available 24/7 if you have an emergency. If you need medication refills please notify your pharmacy one week in advance and they will send [place] a request.

## 2018-10-08 ENCOUNTER — Other Ambulatory Visit: Payer: Self-pay | Admitting: Internal Medicine

## 2018-10-08 DIAGNOSIS — I10 Essential (primary) hypertension: Secondary | ICD-10-CM

## 2018-10-08 NOTE — Progress Notes (Signed)
Internal Medicine Clinic Attending  Case discussed with Dr. Seawell at the time of the visit.  We reviewed the resident's history and exam and pertinent patient test results.  I agree with the assessment, diagnosis, and plan of care documented in the resident's note.    

## 2018-10-08 NOTE — Addendum Note (Signed)
Addended by: Aldine Contes on: 10/08/2018 01:55 PM   Modules accepted: Level of Service

## 2018-10-19 ENCOUNTER — Encounter: Payer: Self-pay | Admitting: Cardiovascular Disease

## 2018-10-19 ENCOUNTER — Ambulatory Visit (INDEPENDENT_AMBULATORY_CARE_PROVIDER_SITE_OTHER): Payer: Medicare HMO | Admitting: Cardiovascular Disease

## 2018-10-19 ENCOUNTER — Other Ambulatory Visit (HOSPITAL_COMMUNITY): Payer: Medicare HMO

## 2018-10-19 ENCOUNTER — Other Ambulatory Visit: Payer: Self-pay

## 2018-10-19 VITALS — BP 120/80 | HR 52 | Ht 61.0 in | Wt 137.6 lb

## 2018-10-19 DIAGNOSIS — I350 Nonrheumatic aortic (valve) stenosis: Secondary | ICD-10-CM

## 2018-10-19 DIAGNOSIS — I2510 Atherosclerotic heart disease of native coronary artery without angina pectoris: Secondary | ICD-10-CM | POA: Diagnosis not present

## 2018-10-19 DIAGNOSIS — I5032 Chronic diastolic (congestive) heart failure: Secondary | ICD-10-CM | POA: Diagnosis not present

## 2018-10-19 NOTE — Patient Instructions (Signed)
Medication Instructions:  Your provider recommends that you continue on your current medications as directed. Please refer to the Current Medication list given to you today.    Labwork: None   Testing/Procedures: Your provider has requested that you have an echocardiogram in 1 year. Echocardiography is a painless test that uses sound waves to create images of your heart. It provides your doctor with information about the size and shape of your heart and how well your heart's chambers and valves are working. This procedure takes approximately one hour. There are no restrictions for this procedure.    Follow-Up: You will be called to arrange your 1 year echo and office visit with Dr. Burt Knack when the schedule is available.

## 2018-10-19 NOTE — Progress Notes (Signed)
Cardiology Office Note:    Date:  10/19/2018   ID:  Sherry [name], DOB [birth year], MRN [id number]  PCP:  Marty Heck, DO  Cardiologist:  Sherren Mocha, MD  Electrophysiologist:  None   Referring MD: [name], MD   Chief Complaint  Patient presents with  . Follow-up    aortic valve disease    History of Present Illness:    Sherry [name] is a 73 y.o. female with a hx of chronic diastolic heart failure, multivessel coronary artery disease, and aortic stenosis.  The patient is very limited from a physical perspective and is nearly wheelchair-bound.  She is able to help with some cooking and cleaning around the house but mostly has to do this from a seated position.  She is here with her husband today.  She reports continued clinical stability with no symptoms of chest pain, chest pressure, shortness of breath, orthopnea, PND, or heart palpitations.  She had a recent echocardiogram that is reviewed with her today.  Past Medical History:  Diagnosis Date  . Anemia   . Aortic stenosis   . Arthritis   . Bilateral lower extremity edema   . Bradycardia    a. 01/2013 - asymptomatic.  [name] Chronic diastolic heart failure ([place])   . CKD (chronic kidney disease)    a. baseline CKD stage III  . Coronary artery disease   . GERD (gastroesophageal reflux disease)   . Headache(784.0)   . Heart block AV second degree 02/15/2013  . Heart murmur   . Hyperlipidemia   . Hypertension   . Mobitz (type) I (Wenckebach's) atrioventricular block    a. 01/2013 - asymptomatic.  . Morbid obesity ([place])   . Seizure disorder ([place])   . Seizures ([place])   . TIA (transient ischemic attack) 2014   pt stated she had "mini strokes"    Past Surgical History:  Procedure Laterality Date  . ABDOMINAL HYSTERECTOMY    . LEFT AND RIGHT HEART CATHETERIZATION WITH CORONARY ANGIOGRAM N/A 05/24/2013   Procedure: LEFT AND RIGHT HEART CATHETERIZATION WITH CORONARY ANGIOGRAM;  Surgeon: Sinclair Grooms,  MD;  Location: [facility] CATH LAB;  Service: Cardiovascular;  Laterality: N/A;    Current Medications: Current Meds  Medication Sig  . alendronate (FOSAMAX) 70 MG tablet TAKE 1 TABLET BY MOUTH ONCE WEEKLY WITH A FULL GLASS OF WATER ON AN EMPTY STOMACH.  [name] amLODipine (NORVASC) 10 MG tablet Take 1 tablet (10 mg total) by mouth daily.  [name] aspirin 81 MG EC tablet Take 81 mg by mouth daily.   . Calcium Carb-Cholecalciferol (CALCIUM 500 +D) 500-400 MG-UNIT TABS Take 1 tablet by mouth 2 (two) times daily.  . furosemide (LASIX) 40 MG tablet Take 1 tablet (40 mg total) by mouth daily.  [name] lisinopril (ZESTRIL) 40 MG tablet Take 1 tablet (40 mg total) by mouth daily.  [name] PHENobarbital (LUMINAL) 64.8 MG tablet TAKE TWO TABLETS BY MOUTH DAILY  . pravastatin (PRAVACHOL) 40 MG tablet TAKE ONE TABLET BY MOUTH DAILY  . zonisamide (ZONEGRAN) 100 MG capsule TAKE FOUR CAPSULES BY MOUTH DAILY     Allergies:   Ibuprofen   Social History   Socioeconomic History  . Marital status: Married    Spouse name: Not on file  . Number of children: Not on file  . Years of education: Not on file  . Highest education level: Not on file  Occupational History  . Not on file  Social Needs  . Financial resource strain: Not on  file  . Food insecurity    Worry: Not on file    Inability: Not on file  . Transportation needs    Medical: Not on file    Non-medical: Not on file  Tobacco Use  . Smoking status: Former Smoker    Types: Cigarettes    Quit date: 06/03/2000    Years since quitting: 18.3  . Smokeless tobacco: Never Used  Substance and Sexual Activity  . Alcohol use: No    Alcohol/week: 0.0 standard drinks  . Drug use: No  . Sexual activity: Not Currently    Partners: Male  Lifestyle  . Physical activity    Days per week: Not on file    Minutes per session: Not on file  . Stress: Not on file  Relationships  . Social Herbalist on phone: Not on file    Gets together: Not on file    Attends  religious service: Not on file    Active member of club or organization: Not on file    Attends meetings of clubs or organizations: Not on file    Relationship status: Not on file  Other Topics Concern  . Not on file  Social History Narrative  . Not on file     Family History: The patient's family history includes Hypertension in her unknown relative.  ROS:   Please see the history of present illness.    Positive for generalized weakness.  All other systems reviewed and are negative.  EKGs/Labs/Other Studies Reviewed:    The following studies were reviewed today: Myocardial Amyloid Scan 01-09-2018: Study Highlights   H/CL ratio is 1.08.  By semi-quantitative assessment there is Grade 0 that represents no uptake and normal bone uptake.  There are hot spots with increased radiotracer uptake in both kidneys. Further evaluation suggested  The study is normal and not suggestive of TRR amyloidosis.   Echo 10-07-2018: IMPRESSIONS    1. The AoV is severely calcified and restricted in motion. The V max is 3.34 m/s with peak/mean gradients of 45 mmHG/83mmHG. The AVA is measured at 0.99 cm2. This is overestimated due to incorrect LVOT VTI measurement (do not believe LVOT VTI of 32 cm  is real, especially looking at measurement). Her LVOT VTI and stroke volume index are likely well below 35 cc/m2 given the small LV cavity and obliteration of the cavity in systole. Overall, this likely represents paradoxical low-flow low gradient severe  aortic stenosis. An aortic valve calcium score would easily be able to diagnose severe aortic stenosis if intervention is considered.  2. The aortic valve is tricuspid. Severely thickening of the aortic valve. Severe calcifcation of the aortic valve. Moderate-severe stenosis of the aortic valve.  3. There is severe concentric LVH. This can be due to hypertensive heart disease. Her RV also is noted to have significant hypetrophy, and in setting of  pericardial effusion, cardiac amyloidosis is a possibility. There is complete obliteration of the LV  cavity in systole with an intracavitary gradient (~9 mmHg).  4. The left ventricle has hyperdynamic systolic function, with an ejection fraction of >65%. The cavity size was small. There is severe concentric left ventricular hypertrophy. Grade 2 diastolic dysfunction. Elevated mean left atrial pressure.  5. The right ventricle has low normal systolic function. The cavity was normal. There is moderately increased right ventricular wall thickness. Right ventricular systolic pressure is normal.  6. Left atrial size was mild-moderately dilated.  7. Small pericardial effusion.  8. The pericardial  effusion is circumferential.  9. The mitral valve is grossly normal. Mild thickening of the mitral valve leaflet. Moderate calcification of the mitral valve leaflet. There is moderate to severe mitral annular calcification present. 10. The tricuspid valve is grossly normal. 11. The aorta is normal unless otherwise noted. 12. There is evidence of plaque in the aortic root. 13. When compared to the prior study: No significant change from prior echo (02/14/2017).  FINDINGS  Left Ventricle: The left ventricle has hyperdynamic systolic function, with an ejection fraction of >65%. The cavity size was small. There is severe concentric left ventricular hypertrophy. Grade 2 diastolic dysfunction. Elevated mean left atrial  pressure There is severe concentric LVH. This can be due to hypertensive heart disease. Her RV also is noted to have significant hypetrophy, and in setting of pericardial effusion, cardiac amyloidosis is a possibility. There is complete obliteration of  the LV cavity in systole with an intracavitary gradient (~9 mmHg).  Right Ventricle: The right ventricle has low normal systolic function. The cavity was normal. There is moderately increased right ventricular wall thickness. Right ventricular  systolic pressure is normal.  Left Atrium: Left atrial size was mild-moderately dilated.  Right Atrium: Right atrial size was normal in size.  Interatrial Septum: No atrial level shunt detected by color flow Doppler.  Pericardium: A small pericardial effusion is present. The pericardial effusion is circumferential.  Mitral Valve: The mitral valve is grossly normal. Mild thickening of the mitral valve leaflet. Moderate calcification of the mitral valve leaflet. There is moderate to severe mitral annular calcification present. Mitral valve regurgitation is mild by  color flow Doppler.  Tricuspid Valve: The tricuspid valve is grossly normal. Tricuspid valve regurgitation was not visualized by color flow Doppler.  Aortic Valve: The aortic valve is tricuspid Severely thickening of the aortic valve, with moderately decreased cusp excursion. Severe calcifcation of the aortic valve. Aortic valve regurgitation was not visualized by color flow Doppler. There is  Moderate-severe stenosis of the aortic valve, with a calculated valve area of 0.97 cm. The AoV is severely calcified and restricted in motion. The V max is 3.34 m/s with peak/mean gradients of 45 mmHG/5mmHG. The AVA is measured at 0.99 cm2. This is  overestimated due to incorrect LVOT VTI measurement (do not believe LVOT VTI of 32 cm is real, especially looking at measurement). Her LVOT VTI and stroke volume index are likely well below 35 cc/m2 given the small LV cavity and obliteration of the  cavity in systole. Overall, this likely represents paradoxical low-flow low gradient severe aortic stenosis. An aortic valve calcium score would easily be able to diagnose severe aortic stenosis if intervention is considered.  Pulmonic Valve: The pulmonic valve was grossly normal. Pulmonic valve regurgitation is trivial by color flow Doppler.  Aorta: The aorta is normal unless otherwise noted. There is evidence of plaque in the aortic root.   Venous: The inferior vena cava is normal in size with greater than 50% respiratory variability.  Compared to previous exam: No significant change from prior echo (02/14/2017).    +--------------+--------++ LEFT VENTRICLE         +----------------+---------++ +--------------+--------++ Diastology                PLAX 2D                +----------------+---------++ +--------------+--------++ LV e' lateral:  3.37 cm/s LVIDd:        4.20 cm  +----------------+---------++ +--------------+--------++ LV E/e' lateral:33.8  LVIDs:        1.90 cm  +----------------+---------++ +--------------+--------++ LV e' medial:   3.02 cm/s LV PW:        1.30 cm  +----------------+---------++ +--------------+--------++ LV E/e' medial: 37.7      LV IVS:       1.40 cm  +----------------+---------++ +--------------+--------++ LVOT diam:    1.90 cm  +--------------+--------++ LV SV:        67 ml    +--------------+--------++ LV SV Index:  39.86    +--------------+--------++ LVOT Area:    2.84 cm +--------------+--------++                        +--------------+--------++  +---------------+---------++ RIGHT VENTRICLE          +---------------+---------++ RV Basal diam: 3.10 cm   +---------------+---------++ RV S prime:    8.73 cm/s +---------------+---------++ TAPSE (M-mode):1.8 cm    +---------------+---------++ RVSP:          23.2 mmHg +---------------+---------++  +---------------+-------++-----------++ LEFT ATRIUM           Index       +---------------+-------++-----------++ LA diam:       2.80 cm1.69 cm/m  +---------------+-------++-----------++ LA Vol (A2C):  39.5 ml23.77 ml/m +---------------+-------++-----------++ LA Vol (A4C):  68.6 ml41.28 ml/m +---------------+-------++-----------++ LA Biplane Vol:52.3 ml31.47 ml/m +---------------+-------++-----------++  +------------+---------++-----------++ RIGHT ATRIUM         Index       +------------+---------++-----------++ RA Pressure:3.00 mmHg            +------------+---------++-----------++ RA Area:    15.00 cm            +------------+---------++-----------++ RA Volume:  38.80 ml 23.35 ml/m +------------+---------++-----------++  +------------------+------------++ AORTIC VALVE                   +------------------+------------++ AV Area (Vmax):   0.96 cm     +------------------+------------++ AV Area (Vmean):  0.99 cm     +------------------+------------++ AV Area (VTI):    0.97 cm     +------------------+------------++ AV Vmax:          329.00 cm/s  +------------------+------------++ AV Vmean:         228.000 cm/s +------------------+------------++ AV VTI:           0.925 m      +------------------+------------++ AV Peak Grad:     43.3 mmHg    +------------------+------------++ AV Mean Grad:     23.0 mmHg    +------------------+------------++ LVOT Vmax:        111.00 cm/s  +------------------+------------++ LVOT Vmean:       79.300 cm/s  +------------------+------------++ LVOT VTI:         0.317 m      +------------------+------------++ LVOT/AV VTI ratio:0.34         +------------------+------------++   +-------------+-------++ AORTA                +-------------+-------++ Ao Root diam:2.90 cm +-------------+-------++  +--------------+-----------++ +---------------+-----------++ MITRAL VALVE              TRICUSPID VALVE            +--------------+-----------++ +---------------+-----------++ MV Area (PHT):1.78 cm    TR Peak grad:  20.2 mmHg   +--------------+-----------++ +---------------+-----------++ MV PHT:       123.83 msec TR Vmax:       233.00 cm/s +--------------+-----------++ +---------------+-----------++ MV Decel Time:427 msec    Estimated RAP:  3.00 mmHg   +--------------+-----------++ +---------------+-----------++ +--------------+-----------++ RVSP:  23.2 mmHg   MV E velocity:114.00 cm/s +---------------+-----------++ +--------------+-----------++ MV A velocity:116.00 cm/s +--------------+-------+ +--------------+-----------++ SHUNTS                MV E/A ratio: 0.98        +--------------+-------+ +--------------+-----------++ Systemic VTI: 0.32 m                                +--------------+-------+                               Systemic Diam:1.90 cm                               +--------------+-------+   EKG:  EKG is not ordered today.    Recent Labs: 09/16/2018: BUN 38; Creatinine, Ser 1.50; Potassium 4.7; Sodium 141  Recent Lipid Panel    Component Value Date/Time   CHOL 174 01/16/2015 1040   TRIG 58 01/16/2015 1040   HDL 76 01/16/2015 1040   CHOLHDL 2.3 01/16/2015 1040   VLDL 12 01/16/2015 1040   LDLCALC 86 01/16/2015 1040    Physical Exam:    VS:  BP 120/80   Pulse (!) 52   Ht 5\' 1"  (1.549 m)   Wt 137 lb 9.6 oz (62.4 kg)   SpO2 99%   BMI 26.00 kg/m     Wt Readings from Last 3 Encounters:  10/19/18 137 lb 9.6 oz (62.4 kg)  10/07/18 140 lb (63.5 kg)  09/16/18 142 lb 3.2 oz (64.5 kg)     GEN:  Pleasant woman, in wheelchair in no acute distress HEENT: Normal NECK: No JVD; bilateral carotid bruits LYMPHATICS: No lymphadenopathy CARDIAC: Bradycardic and regular with grade 3/6 harsh late peaking systolic murmur at the right upper sternal border with no diastolic murmur RESPIRATORY:  Clear to auscultation without rales, wheezing or rhonchi  ABDOMEN: Soft, non-tender, non-distended MUSCULOSKELETAL: Trace bilateral pretibial edema; No deformity  SKIN: Warm and dry NEUROLOGIC:  Alert and oriented x 3 PSYCHIATRIC:  Normal affect   ASSESSMENT:    1. Aortic valve stenosis, etiology of cardiac valve disease unspecified   2. Chronic diastolic congestive heart  failure ([place])   3. Coronary artery disease involving native coronary artery of native heart without angina pectoris    PLAN:    In order of problems listed above:  1. The patient's echocardiogram is reviewed.  She has evidence of paradoxical low flow low gradient severe aortic stenosis.  Her valve is severely calcified with limited mobility of the leaflets.  She remains asymptomatic at her low physical activity level.  Considering the multitude of medical problems and lack of symptoms, we have elected to treat her conservatively.  The patient is also in favor of this strategy.  I will see her back in 1 year for follow-up with a repeat echocardiogram. 2. The patient has New York Heart Association functional class I symptoms at a low functional activity level.  She has no evidence of volume overload on exam.  She has no symptoms of orthopnea or PND.  Continue current management.  As outlined above, she has undergone testing for transthyretin amyloid with a negative scan. 3. No anginal symptoms on low-dose aspirin, amlodipine, and a statin drug.  Continue medical management.  Overall the patient remains remarkably stable from a clinical perspective.  I will  see her back in 1 year with a follow-up echocardiogram at that time.   Medication Adjustments/Labs and Tests Ordered: Current medicines are reviewed at length with the patient today.  Concerns regarding medicines are outlined above.  Orders Placed This Encounter  Procedures  . ECHOCARDIOGRAM COMPLETE   No orders of the defined types were placed in this encounter.   Patient Instructions  Medication Instructions:  Your provider recommends that you continue on your current medications as directed. Please refer to the Current Medication list given to you today.    Labwork: None   Testing/Procedures: Your provider has requested that you have an echocardiogram in 1 year. Echocardiography is a painless test that uses sound waves to create  images of your heart. It provides your doctor with information about the size and shape of your heart and how well your heart's chambers and valves are working. This procedure takes approximately one hour. There are no restrictions for this procedure.    Follow-Up: You will be called to arrange your 1 year echo and office visit with Dr. Burt Knack when the schedule is available.    Signed, Sherren Mocha, MD  10/19/2018 1:09 PM    [place]

## 2018-11-11 DIAGNOSIS — R69 Illness, unspecified: Secondary | ICD-10-CM | POA: Diagnosis not present

## 2018-11-12 ENCOUNTER — Telehealth: Payer: Self-pay

## 2018-11-12 NOTE — Telephone Encounter (Signed)
Requesting power wheelchair, please call pt back.

## 2018-11-13 NOTE — Telephone Encounter (Signed)
RE: Power wheelchair Received: 1 month ago Message Contents  Nora, Laurell Roof, DO  [place], [place], [place], [place]        Note has been addended. Thanks for your help.

## 2018-12-04 ENCOUNTER — Telehealth: Payer: Self-pay | Admitting: Internal Medicine

## 2018-12-04 NOTE — Telephone Encounter (Signed)
Pt is calling checking on status for wheelchair; pls contact [phone]

## 2018-12-04 NOTE — Telephone Encounter (Signed)
This is being addressed in separate phone encounter started 11/12/2018. Hubbard Hartshorn, RN, BSN

## 2018-12-04 NOTE — Telephone Encounter (Signed)
Returned call to patient. States she has had her current power w/c for about 12 years and she received it from [facility]. Explained they are now called [place]. Telehealth appt scheduled for 12/07/2018 at 0915 to discuss need for new power w/c. Community message also sent to [employer] at [employer].  Hubbard Hartshorn, RN, BSN

## 2018-12-05 ENCOUNTER — Other Ambulatory Visit: Payer: Self-pay | Admitting: Internal Medicine

## 2018-12-05 DIAGNOSIS — M810 Age-related osteoporosis without current pathological fracture: Secondary | ICD-10-CM

## 2018-12-07 ENCOUNTER — Other Ambulatory Visit: Payer: Self-pay

## 2018-12-07 ENCOUNTER — Ambulatory Visit (INDEPENDENT_AMBULATORY_CARE_PROVIDER_SITE_OTHER): Payer: Medicare HMO | Admitting: Internal Medicine

## 2018-12-07 DIAGNOSIS — I5032 Chronic diastolic (congestive) heart failure: Secondary | ICD-10-CM | POA: Diagnosis not present

## 2018-12-07 DIAGNOSIS — M170 Bilateral primary osteoarthritis of knee: Secondary | ICD-10-CM

## 2018-12-07 DIAGNOSIS — R269 Unspecified abnormalities of gait and mobility: Secondary | ICD-10-CM | POA: Diagnosis not present

## 2018-12-07 DIAGNOSIS — Z993 Dependence on wheelchair: Secondary | ICD-10-CM

## 2018-12-07 DIAGNOSIS — I350 Nonrheumatic aortic (valve) stenosis: Secondary | ICD-10-CM

## 2018-12-07 NOTE — Telephone Encounter (Signed)
Sherry [name], Orvis Brill, RN        Lauren -   We are working with patient for a power wheelchair. Patient had an appointment with Dr. Sharon Seller on 09/16/18 and most of the information for the F2F is included in this note although I did reach out to Dr. Sharon Seller and request that manual wheelchair be ruled out in the note in order to qualify.   Since patient has an appointment on Monday, please make sure that Manual wheelchair is also ruled out in addition to the rest of the criteria.   I will fax the mobility guidelines to your attention for this appointment on Monday. This will be the guidelines when a PT evaluation is not required. Please let me know if you have any questions.

## 2018-12-07 NOTE — Progress Notes (Signed)
   CC: Abnormality of gait  This is a telephone encounter between [place] on 12/07/2018 for Abnormality of gait. The visit was conducted with the patient located at home and [facility] at [facility]. The patient's identity was confirmed using their DOB and current address. The patient has consented to being evaluated through a telephone encounter and understands the associated risks (an examination cannot be done and the patient may need to come in for an appointment) / benefits (allows the patient to remain at home, decreasing exposure to coronavirus). I personally spent 6 minutes on medical discussion.   HPI:  [name] is a 73 y.o. with PMH as below.   Please see A&P for assessment of the patient's acute and chronic medical conditions.   Past Medical History:  Diagnosis Date  . Anemia   . Aortic stenosis   . Arthritis   . Bilateral lower extremity edema   . Bradycardia    a. 01/2013 - asymptomatic.  [name] Chronic diastolic heart failure ([place])   . CKD (chronic kidney disease)    a. baseline CKD stage III  . Coronary artery disease   . GERD (gastroesophageal reflux disease)   . Headache(784.0)   . Heart block AV second degree 02/15/2013  . Heart murmur   . Hyperlipidemia   . Hypertension   . Mobitz (type) I (Wenckebach's) atrioventricular block    a. 01/2013 - asymptomatic.  . Morbid obesity ([place])   . Seizure disorder ([place])   . Seizures ([place])   . TIA (transient ischemic attack) 2014   pt stated she had "mini strokes"   Review of Systems:  Performed and all others negative.  Assessment & Plan:   See Encounters Tab for problem based charting.  Patient discussed with Dr. Angelia Mould

## 2018-12-07 NOTE — Telephone Encounter (Signed)
Standard Written Order for power w/c faxed to [place] at [phone]. Hubbard Hartshorn, BSN, RN-BC

## 2018-12-07 NOTE — Assessment & Plan Note (Signed)
Patient states that she needs a new motorized wheelchair. She has been in a wheelchair for greater than 13 years. Her current wheelchair is starting to break down. She requires a motorized wheelchair due to bilateral knee osteoarthritis and diffuse lower extremity weakness. She is currently unable to provide some of her ADLs including, cooking, toileting, and cleaning do to her weakness and inability to stand. She has the mental capacity and willingness to navigate motorized wheelchair.  She is unable to operate a manual wheelchair due to upper extremity weakness and her comorbidities including heart failure secondary to severe aortic stenosis. She is unable to use a cane, walker, or crutches due to upper extremity weakness and recurrent falls when she does attempt. She is also unable to use a scooter as she has difficulty transferring and again due to her upper and lower extremity weakness.  A/P: - MD seeing patient today to assess need for motorized wheelchair.  - MD to send out a DME order for a power/electric wheelchair

## 2018-12-09 NOTE — Progress Notes (Signed)
Internal Medicine Clinic Attending  Case discussed with Dr. Helberg at the time of the visit.  We reviewed the resident's history and exam and pertinent patient test results.  I agree with the assessment, diagnosis, and plan of care documented in the resident's note.    

## 2018-12-28 NOTE — Telephone Encounter (Signed)
Detail Written order form placed in Dr. Jerrell Mylar box for signature and date. Hubbard Hartshorn, BSN, RN-BC

## 2018-12-29 NOTE — Telephone Encounter (Addendum)
Signed DRO for power w/c faxed to Andria Rhein at [facility] at [phone]. Hubbard Hartshorn, BSN, RN-BC

## 2018-12-29 NOTE — Telephone Encounter (Signed)
Form is signed in my box. Thank you.

## 2019-01-05 ENCOUNTER — Encounter (HOSPITAL_COMMUNITY): Payer: Self-pay

## 2019-01-05 ENCOUNTER — Emergency Department (HOSPITAL_COMMUNITY)
Admission: EM | Admit: 2019-01-05 | Discharge: 2019-01-06 | Disposition: A | Discharge status: Home / Self Care | Payer: Medicare HMO | Attending: Emergency Medicine | Admitting: Emergency Medicine

## 2019-01-05 ENCOUNTER — Other Ambulatory Visit: Payer: Self-pay

## 2019-01-05 DIAGNOSIS — N183 Chronic kidney disease, stage 3 unspecified: Secondary | ICD-10-CM | POA: Insufficient documentation

## 2019-01-05 DIAGNOSIS — R52 Pain, unspecified: Secondary | ICD-10-CM | POA: Diagnosis not present

## 2019-01-05 DIAGNOSIS — Z20828 Contact with and (suspected) exposure to other viral communicable diseases: Secondary | ICD-10-CM | POA: Insufficient documentation

## 2019-01-05 DIAGNOSIS — I5032 Chronic diastolic (congestive) heart failure: Secondary | ICD-10-CM | POA: Diagnosis not present

## 2019-01-05 DIAGNOSIS — R531 Weakness: Secondary | ICD-10-CM

## 2019-01-05 DIAGNOSIS — I4891 Unspecified atrial fibrillation: Secondary | ICD-10-CM | POA: Diagnosis not present

## 2019-01-05 DIAGNOSIS — I2510 Atherosclerotic heart disease of native coronary artery without angina pectoris: Secondary | ICD-10-CM | POA: Diagnosis not present

## 2019-01-05 DIAGNOSIS — N3001 Acute cystitis with hematuria: Secondary | ICD-10-CM

## 2019-01-05 DIAGNOSIS — I130 Hypertensive heart and chronic kidney disease with heart failure and stage 1 through stage 4 chronic kidney disease, or unspecified chronic kidney disease: Secondary | ICD-10-CM | POA: Insufficient documentation

## 2019-01-05 DIAGNOSIS — Z7982 Long term (current) use of aspirin: Secondary | ICD-10-CM | POA: Insufficient documentation

## 2019-01-05 DIAGNOSIS — I517 Cardiomegaly: Secondary | ICD-10-CM | POA: Diagnosis not present

## 2019-01-05 DIAGNOSIS — Z87891 Personal history of nicotine dependence: Secondary | ICD-10-CM | POA: Diagnosis not present

## 2019-01-05 DIAGNOSIS — R509 Fever, unspecified: Secondary | ICD-10-CM

## 2019-01-05 DIAGNOSIS — Z79899 Other long term (current) drug therapy: Secondary | ICD-10-CM | POA: Insufficient documentation

## 2019-01-05 DIAGNOSIS — Z8673 Personal history of transient ischemic attack (TIA), and cerebral infarction without residual deficits: Secondary | ICD-10-CM | POA: Diagnosis not present

## 2019-01-05 DIAGNOSIS — I10 Essential (primary) hypertension: Secondary | ICD-10-CM | POA: Diagnosis not present

## 2019-01-05 DIAGNOSIS — R569 Unspecified convulsions: Secondary | ICD-10-CM | POA: Diagnosis not present

## 2019-01-05 DIAGNOSIS — R5081 Fever presenting with conditions classified elsewhere: Secondary | ICD-10-CM

## 2019-01-05 LAB — BASIC METABOLIC PANEL
Anion gap: 11 (ref 5–15)
BUN: 41 mg/dL — ABNORMAL HIGH (ref 8–23)
CO2: 21 mmol/L — ABNORMAL LOW (ref 22–32)
Calcium: 9.2 mg/dL (ref 8.9–10.3)
Chloride: 108 mmol/L (ref 98–111)
Creatinine, Ser: 1.74 mg/dL — ABNORMAL HIGH (ref 0.44–1.00)
GFR calc Af Amer: 33 mL/min — ABNORMAL LOW (ref >60)
GFR calc non Af Amer: 29 mL/min — ABNORMAL LOW (ref >60)
Glucose, Bld: 138 mg/dL — ABNORMAL HIGH (ref 70–99)
Potassium: 4.1 mmol/L (ref 3.5–5.1)
Sodium: 140 mmol/L (ref 135–145)

## 2019-01-05 LAB — CBC
HCT: 39.4 % (ref 36.0–46.0)
Hemoglobin: 12.6 g/dL (ref 12.0–15.0)
MCH: 30.3 pg (ref 26.0–34.0)
MCHC: 32 g/dL (ref 30.0–36.0)
MCV: 94.7 fL (ref 80.0–100.0)
Platelets: 168 10*3/uL (ref 150–400)
RBC: 4.16 MIL/uL (ref 3.87–5.11)
RDW: 12.6 % (ref 11.5–15.5)
WBC: 6 10*3/uL (ref 4.0–10.5)
nRBC: 0 % (ref 0.0–0.2)

## 2019-01-05 LAB — CBG MONITORING, ED: Glucose-Capillary: 103 mg/dL — ABNORMAL HIGH (ref 70–99)

## 2019-01-05 MED ORDER — ACETAMINOPHEN 325 MG PO TABS
650.0000 mg | ORAL_TABLET | Freq: Once | ORAL | Status: AC | PRN
  Administered 2019-01-05: 650 mg via ORAL
  Filled 2019-01-05: qty 2

## 2019-01-05 MED ORDER — SODIUM CHLORIDE 0.9% FLUSH: 3.0000 mL | Freq: Once | INTRAVENOUS | Status: DC

## 2019-01-05 NOTE — ED Triage Notes (Signed)
Pt from home with ems for generalized weakness and chronic bilateral leg pain for the past 2 hrs. Per EMS family states she has been shaking for 2 hrs straight, family concerned for a seizure. Pt alert and oriented x4 upon arrival to ED, nad noted

## 2019-01-06 ENCOUNTER — Emergency Department (HOSPITAL_COMMUNITY): Payer: Medicare HMO

## 2019-01-06 DIAGNOSIS — R531 Weakness: Secondary | ICD-10-CM | POA: Diagnosis not present

## 2019-01-06 DIAGNOSIS — I517 Cardiomegaly: Secondary | ICD-10-CM | POA: Diagnosis not present

## 2019-01-06 LAB — URINALYSIS, ROUTINE W REFLEX MICROSCOPIC
Bilirubin Urine: NEGATIVE
Glucose, UA: NEGATIVE mg/dL
Ketones, ur: NEGATIVE mg/dL
Nitrite: NEGATIVE
Protein, ur: 100 mg/dL — AB
Specific Gravity, Urine: 1.02 (ref 1.005–1.030)
pH: 6 (ref 5.0–8.0)

## 2019-01-06 LAB — SARS CORONAVIRUS 2 (TAT 6-24 HRS): SARS Coronavirus 2: NEGATIVE

## 2019-01-06 MED ORDER — CEPHALEXIN 250 MG PO CAPS
500.0000 mg | ORAL_CAPSULE | Freq: Once | ORAL | Status: AC
  Administered 2019-01-06: 06:00:00 500 mg via ORAL
  Filled 2019-01-06: qty 2

## 2019-01-06 MED ORDER — ONDANSETRON 4 MG PO TBDP: 4.0000 mg | ORAL_TABLET | Freq: Three times a day (TID) | ORAL | 0 refills | Status: AC | PRN

## 2019-01-06 MED ORDER — CEPHALEXIN 500 MG PO CAPS: 500.0000 mg | ORAL_CAPSULE | Freq: Three times a day (TID) | ORAL | 0 refills | Status: AC

## 2019-01-06 NOTE — ED Provider Notes (Signed)
[facility] EMERGENCY DEPARTMENT Provider Note  CSN: [id number] Arrival date & time: 01/05/19 2000  Chief Complaint(s) Weakness  HPI Sherry [name] is a 73 y.o. female   The history is provided by the patient.  Weakness Severity:  Severe Onset quality:  Gradual Duration:  1 day Timing:  Constant Progression:  Waxing and waning Chronicity:  New Relieved by:  Nothing Worsened by:  Nothing Associated symptoms: cough (mild), fever, lethargy, nausea and vomiting (one episode just PTA)   Associated symptoms: no abdominal pain, no dysuria, no frequency, no headaches, no melena and no shortness of breath   Risk factors: congestive heart failure    Patient was noted to be shaking at home, but still conscious.   No known sick contacts.  Past Medical History Past Medical History:  Diagnosis Date  . Anemia   . Aortic stenosis   . Arthritis   . Bilateral lower extremity edema   . Bradycardia    a. 01/2013 - asymptomatic.  [name] Chronic diastolic heart failure ([place])   . CKD (chronic kidney disease)    a. baseline CKD stage III  . Coronary artery disease   . GERD (gastroesophageal reflux disease)   . Headache(784.0)   . Heart block AV second degree 02/15/2013  . Heart murmur   . Hyperlipidemia   . Hypertension   . Mobitz (type) I (Wenckebach's) atrioventricular block    a. 01/2013 - asymptomatic.  . Morbid obesity ([place])   . Seizure disorder ([place])   . Seizures ([place])   . TIA (transient ischemic attack) 2014   pt stated she had "mini strokes"   Patient Active Problem List   Diagnosis Date Noted  . Seizures ([place])   . Hyperlipidemia   . GERD (gastroesophageal reflux disease)   . Arthritis   . Blurry vision, bilateral 05/08/2016  . Thoracic aortic atherosclerosis ([place]) 05/08/2016  . Osteoporosis 10/11/2015  . Vitamin D deficiency 04/22/2014  . Health care maintenance 04/22/2014  . Coronary artery disease   . 1st degree AV block 03/05/2013  . Aortic  stenosis 02/18/2013  . Chronic diastolic congestive heart failure ([place]) 11/11/2012  . Bradycardia 07/02/2012  . Multiple lacunar infarcts ([place]) 05/07/2012  . Late effects of cerebrovascular disease 01/23/2012  . Internal and external hemorrhoids without complication [id number]  . Degenerative cervical disc 01/20/2012  . CKD (chronic kidney disease) stage 3, GFR 30-59 ml/min 04/28/2011  . Abnormality of gait- basically wheelchair bound 07/31/2009  . Essential hypertension 06/04/2006  . Osteoarthritis of lumbar spine 06/04/2006  . Epilepsy ([place]) 06/04/2006   Home Medication(s) Prior to Admission medications   Medication Sig Start Date End Date Taking? Authorizing Provider  alendronate (FOSAMAX) 70 MG tablet TAKE 1 TABLET BY MOUTH ONCE WEEKLY ON AN EMPTY STOMACH BEFORE BREAKFAST. REMAIN UPRIGHT FOR 30 MINUTES & TAKE WITH 8 OUNCES OF WATER Patient taking differently: Take 70 mg by mouth every Friday.  12/07/18  Yes Seawell, Jaimie A, DO  amLODipine (NORVASC) 10 MG tablet Take 1 tablet (10 mg total) by mouth daily. 10/07/18  Yes Seawell, Jaimie A, DO  aspirin 81 MG EC tablet Take 81 mg by mouth daily.    Yes [provider]  Calcium Carb-Cholecalciferol (CALCIUM 500 +D) 500-400 MG-UNIT TABS Take 1 tablet by mouth 2 (two) times daily. 10/25/15  Yes Jule Ser, DO  furosemide (LASIX) 40 MG tablet Take 1 tablet (40 mg total) by mouth daily. 09/04/18  Yes Seawell, Jaimie A, DO  lisinopril (ZESTRIL) 40 MG tablet Take  1 tablet (40 mg total) by mouth daily. 09/16/18  Yes Seawell, Jaimie A, DO  PHENobarbital (LUMINAL) 64.8 MG tablet TAKE TWO TABLETS BY MOUTH DAILY Patient taking differently: Take 129.6 mg by mouth at bedtime.  06/08/18  Yes Cameron Sprang, MD  pravastatin (PRAVACHOL) 40 MG tablet TAKE ONE TABLET BY MOUTH DAILY Patient taking differently: Take 40 mg by mouth at bedtime.  05/01/18 05/01/19 Yes [name], MD  zonisamide (ZONEGRAN) 100 MG capsule TAKE FOUR CAPSULES BY MOUTH  DAILY Patient taking differently: Take 400 mg by mouth at bedtime.  06/18/18  Yes Cameron Sprang, MD  cephALEXin (KEFLEX) 500 MG capsule Take 1 capsule (500 mg total) by mouth 3 (three) times daily for 7 days. 01/06/19 01/13/19  Fatima Blank, MD  ondansetron (ZOFRAN ODT) 4 MG disintegrating tablet Take 1 tablet (4 mg total) by mouth every 8 (eight) hours as needed for up to 3 days for nausea or vomiting. 01/06/19 01/09/19  Fatima Blank, MD                                                                                                                                    Past Surgical History Past Surgical History:  Procedure Laterality Date  . ABDOMINAL HYSTERECTOMY    . LEFT AND RIGHT HEART CATHETERIZATION WITH CORONARY ANGIOGRAM N/A 05/24/2013   Procedure: LEFT AND RIGHT HEART CATHETERIZATION WITH CORONARY ANGIOGRAM;  Surgeon: Sinclair Grooms, MD;  Location[facility] CATH LAB;  Service: Cardiovascular;  Laterality: N/A;   Family History Family History  Problem Relation Age of Onset  . Hypertension Other     Social History Social History   Tobacco Use  . Smoking status: Former Smoker    Types: Cigarettes    Quit date: 06/03/2000    Years since quitting: 18.6  . Smokeless tobacco: Never Used  Substance Use Topics  . Alcohol use: No    Alcohol/week: 0.0 standard drinks  . Drug use: No   Allergies Ibuprofen  Review of Systems Review of Systems  Constitutional: Positive for fatigue and fever. Negative for chills.  HENT: Negative for ear pain and rhinorrhea.   Respiratory: Positive for cough (mild). Negative for shortness of breath.   Gastrointestinal: Positive for nausea and vomiting (one episode just PTA). Negative for abdominal distention, abdominal pain and melena.  Genitourinary: Negative for dysuria and frequency.  Neurological: Positive for weakness. Negative for headaches.   All other systems are reviewed and are negative for acute change except as noted in  the HPI  Physical Exam Vital Signs  I have reviewed the triage vital signs BP (!) 153/70   Pulse 69   Temp 101.2 F (38.4)  (Oral)   Resp 18   SpO2 99%   Physical Exam Vitals signs reviewed.  Constitutional:      General: She is not in acute distress.    Appearance: She is well-developed. She  is not diaphoretic.  HENT:     Head: Normocephalic and atraumatic.     Nose: Nose normal.  Eyes:     General: No scleral icterus.       Right eye: No discharge.        Left eye: No discharge.     Conjunctiva/sclera: Conjunctivae normal.     Pupils: Pupils are equal, round, and reactive to light.  Neck:     Musculoskeletal: Normal range of motion and neck supple.  Cardiovascular:     Rate and Rhythm: Normal rate and regular rhythm.     Heart sounds: No murmur. No friction rub. No gallop.   Pulmonary:     Effort: Pulmonary effort is normal. No respiratory distress.     Breath sounds: Normal breath sounds. No stridor. No rales.  Abdominal:     General: There is no distension.     Palpations: Abdomen is soft.     Tenderness: There is no abdominal tenderness.  Musculoskeletal:        General: No tenderness.  Skin:    General: Skin is warm and dry.     Findings: No erythema or rash.  Neurological:     Mental Status: She is alert and oriented to person, place, and time.     ED Results and Treatments Labs (all labs ordered are listed, but only abnormal results are displayed) Labs Reviewed  BASIC METABOLIC PANEL - Abnormal; Notable for the following components:      Result Value   CO2 21 (*)    Glucose, Bld 138 (*)    BUN 41 (*)    Creatinine, Ser 1.74 (*)    GFR calc non Af Amer 29 (*)    GFR calc Af Amer 33 (*)    All other components within normal limits  URINALYSIS, ROUTINE W REFLEX MICROSCOPIC - Abnormal; Notable for the following components:   APPearance HAZY (*)    Hgb urine dipstick SMALL (*)    Protein, ur 100 (*)    Leukocytes,Ua MODERATE (*)    Bacteria, UA MANY  (*)    All other components within normal limits  CBG MONITORING, ED - Abnormal; Notable for the following components:   Glucose-Capillary 103 (*)    All other components within normal limits  SARS CORONAVIRUS 2 (TAT 6-24 HRS)  CBC                                                                                                                         EKG  EKG Interpretation  Date/Time:  Tuesday January 05 2019 20:20:17 EST Ventricular Rate:  111 PR Interval:    QRS Duration: 100 QT Interval:  334 QTC Calculation: 454 R Axis:   32 Text Interpretation: Atrial fibrillation with rapid ventricular response Minimal voltage criteria for LVH, may be normal variant ( Cornell product ) ST & T wave abnormality, consider lateral ischemia Abnormal ECG ** Poor data quality, interpretation may be adversely affected Confirmed  by Addison Lank [phone]) on 01/06/2019 3:26:38 AM      Radiology Cxr Port 1 View - Chest Pain  Result Date: 01/06/2019 CLINICAL DATA:  Generalized weakness with chronic bilateral leg pain EXAM: PORTABLE CHEST 1 VIEW COMPARISON:  05/14/2013 FINDINGS: Chronic cardiomegaly. Negative mediastinal contours other than atherosclerotic calcification. Mitral annular calcification. There is no edema, consolidation, effusion, or pneumothorax. Shoulder osteoarthritis and diffuse degenerative endplate spurring. IMPRESSION: 1. No evidence of acute disease. 2. Cardiomegaly. Electronically Signed   By: Monte Fantasia M.D.   On: 01/06/2019 04:18    Pertinent labs & imaging results that were available during my care of the patient were reviewed by me and considered in my medical decision making (see chart for details).  Medications Ordered in ED Medications  sodium chloride flush (NS) 0.9 % injection 3 mL (has no administration in time range)  cephALEXin (KEFLEX) capsule 500 mg (has no administration in time range)  acetaminophen (TYLENOL) tablet 650 mg (650 mg Oral Given 01/05/19 2016)                                                                                                                                     Procedures Procedures  (including critical care time)  Medical Decision Making / ED Course I have reviewed the nursing notes for this encounter and the patient's prior records (if available in EHR or on provided paperwork).   Necola Troll was evaluated in Emergency Department on 01/06/2019 for the symptoms described in the history of present illness. She was evaluated in the context of the global COVID-19 pandemic, which necessitated consideration that the patient might be at risk for infection with the SARS-CoV-2 virus that causes COVID-19. Institutional protocols and algorithms that pertain to the evaluation of patients at risk for COVID-19 are in a state of rapid change based on information released by regulatory bodies including the CDC and federal and state organizations. These policies and algorithms were followed during the patient's care in the ED.  Patient presents with generalized fatigue.  She was found to be febrile upon arrival.  Endorsing viral type symptoms.  Labs reassuring without leukocytosis or anemia.  Close to baseline renal function.  UA did reveal evidence of a urinary tract infection for which she will be treated.  Patient was tested for Covid.  She was not hypoxic or in any respiratory distress.  She was nontoxic-appearing. Reported significant improvement since arriving.  Feels she is appropriate for outpatient management with strict return precautions.      Final Clinical Impression(s) / ED Diagnoses Final diagnoses:  Fever  Acute cystitis with hematuria  Fever in other diseases  Generalized weakness    The patient appears reasonably screened and/or stabilized for discharge and I doubt any other medical condition or other [facility] requiring further screening, evaluation, or treatment in the ED at this time prior to discharge.   Disposition: Discharge  Condition: Good  I have discussed the results, Dx and Tx plan with the patient who expressed understanding and agree(s) with the plan. Discharge instructions discussed at great length. The patient was given strict return precautions who verbalized understanding of the instructions. No further questions at time of discharge.    ED Discharge Orders         Ordered    cephALEXin (KEFLEX) 500 MG capsule  3 times daily     01/06/19 0541    ondansetron (ZOFRAN ODT) 4 MG disintegrating tablet  Every 8 hours PRN     01/06/19 0541            Follow Up: Primary care provider  Schedule an appointment as soon as possible for a visit  in 3-5 days, If symptoms do not improve or  worsen      This chart was dictated using voice recognition software.  Despite best efforts to proofread,  errors can occur which can change the documentation meaning.   Fatima Blank, MD 01/06/19 [phone]

## 2019-01-27 NOTE — Telephone Encounter (Signed)
Following message received on 12/29/2018:  Tiana Loft, RN        Signature received. I have sent this to our submission review team to send to patient's insurance for prior auth.

## 2019-02-24 ENCOUNTER — Other Ambulatory Visit: Payer: Self-pay | Admitting: Internal Medicine

## 2019-02-24 ENCOUNTER — Other Ambulatory Visit: Payer: Self-pay | Admitting: Neurology

## 2019-02-24 DIAGNOSIS — M810 Age-related osteoporosis without current pathological fracture: Secondary | ICD-10-CM

## 2019-02-24 DIAGNOSIS — I10 Essential (primary) hypertension: Secondary | ICD-10-CM

## 2019-02-24 DIAGNOSIS — G40209 Localization-related (focal) (partial) symptomatic epilepsy and epileptic syndromes with complex partial seizures, not intractable, without status epilepticus: Secondary | ICD-10-CM

## 2019-02-27 ENCOUNTER — Other Ambulatory Visit: Payer: Self-pay | Admitting: Neurology

## 2019-02-27 DIAGNOSIS — G40209 Localization-related (focal) (partial) symptomatic epilepsy and epileptic syndromes with complex partial seizures, not intractable, without status epilepticus: Secondary | ICD-10-CM

## 2019-03-01 NOTE — Telephone Encounter (Signed)
Dr. Delice Lesch,  Will you please send in Phenobarbitol for pt. She was last seen in the office with you in Feb. Of 2020. You did want her to continue the medication with her Zonisamide at that time. Pt has ha follow up scheduled 03/31/19

## 2019-03-03 ENCOUNTER — Other Ambulatory Visit: Payer: Self-pay | Admitting: Internal Medicine

## 2019-03-03 DIAGNOSIS — M810 Age-related osteoporosis without current pathological fracture: Secondary | ICD-10-CM

## 2019-03-10 ENCOUNTER — Encounter: Payer: Self-pay | Admitting: Internal Medicine

## 2019-03-10 ENCOUNTER — Other Ambulatory Visit: Payer: Self-pay | Admitting: Neurology

## 2019-03-10 ENCOUNTER — Other Ambulatory Visit: Payer: Self-pay

## 2019-03-10 ENCOUNTER — Ambulatory Visit (INDEPENDENT_AMBULATORY_CARE_PROVIDER_SITE_OTHER): Payer: Medicare HMO | Admitting: Internal Medicine

## 2019-03-10 VITALS — BP 178/64 | HR 42 | Temp 98.0°F | Ht 61.0 in | Wt 136.4 lb

## 2019-03-10 DIAGNOSIS — I700 Atherosclerosis of aorta: Secondary | ICD-10-CM

## 2019-03-10 DIAGNOSIS — M810 Age-related osteoporosis without current pathological fracture: Secondary | ICD-10-CM | POA: Diagnosis not present

## 2019-03-10 DIAGNOSIS — I350 Nonrheumatic aortic (valve) stenosis: Secondary | ICD-10-CM

## 2019-03-10 DIAGNOSIS — E782 Mixed hyperlipidemia: Secondary | ICD-10-CM

## 2019-03-10 DIAGNOSIS — N183 Chronic kidney disease, stage 3 unspecified: Secondary | ICD-10-CM | POA: Diagnosis not present

## 2019-03-10 DIAGNOSIS — R531 Weakness: Secondary | ICD-10-CM

## 2019-03-10 DIAGNOSIS — I10 Essential (primary) hypertension: Secondary | ICD-10-CM

## 2019-03-10 DIAGNOSIS — Z79899 Other long term (current) drug therapy: Secondary | ICD-10-CM

## 2019-03-10 DIAGNOSIS — E785 Hyperlipidemia, unspecified: Secondary | ICD-10-CM

## 2019-03-10 DIAGNOSIS — R569 Unspecified convulsions: Secondary | ICD-10-CM | POA: Diagnosis not present

## 2019-03-10 DIAGNOSIS — Z0000 Encounter for general adult medical examination without abnormal findings: Secondary | ICD-10-CM

## 2019-03-10 DIAGNOSIS — G40209 Localization-related (focal) (partial) symptomatic epilepsy and epileptic syndromes with complex partial seizures, not intractable, without status epilepticus: Secondary | ICD-10-CM

## 2019-03-10 DIAGNOSIS — Z1231 Encounter for screening mammogram for malignant neoplasm of breast: Secondary | ICD-10-CM

## 2019-03-10 DIAGNOSIS — Z1211 Encounter for screening for malignant neoplasm of colon: Secondary | ICD-10-CM

## 2019-03-10 NOTE — Assessment & Plan Note (Signed)
On moderate intensity pravastatin 40 mg qd for hyperlipidemia. Continue current medications.

## 2019-03-10 NOTE — Assessment & Plan Note (Signed)
This problem is chronic and stable. Most recent echo 10/19/2018 showed severe aortic stnosis with severe calcifications of the valve. With her comorbidities she has opted for medical management and continues to be asymptomatic. She denies shortness of breath, orthopnea, or LE edema.   - f/u with cardiology 09/2019 for echo

## 2019-03-10 NOTE — Assessment & Plan Note (Addendum)
Needs refill on zonisamide.   - will send message to Dr. Delice Lesch that her GFR is < 50 for alternate options.  ADDENDUM: discussed with Dr. Delice Lesch. She will order refill. Patient has f/u in two weeks and they will discuss alternate medication at that time.

## 2019-03-10 NOTE — Assessment & Plan Note (Signed)
-   fit test ordered - mammogram ordered - she has already received the flu vaccine this year.

## 2019-03-10 NOTE — Patient Instructions (Addendum)
Thank you for allowing [place] to provide your care today. Today we discussed your hypertension and osteoporosis.     I have ordered the following labs for you:  Basic metabolic panel    I will call if any are abnormal.    Today we made the following changes to your medications:   Please stop taking alendronate. We will check your kidney function today and determine if we should continue this medication.   Please follow-up in one month for blood pressure check.    Please call the internal medicine center clinic if you have any questions or concerns, we may be able to help and keep you from a long and expensive emergency room wait. Our clinic and after hours phone number is [phone], the best time to call is Monday through Friday 9 am to 4 pm but there is always someone available 24/7 if you have an emergency. If you need medication refills please notify your pharmacy one week in advance and they will send [place] a request.

## 2019-03-10 NOTE — Progress Notes (Signed)
   CC: hypertension  HPI:  [name] is a 74 y.o. with PMH as below.   Please see A&P for assessment of the patient's acute and chronic medical conditions.   Past Medical History:  Diagnosis Date  . Anemia   . Aortic stenosis   . Arthritis   . Bilateral lower extremity edema   . Bradycardia    a. 01/2013 - asymptomatic.  [name] Chronic diastolic heart failure ([place])   . CKD (chronic kidney disease)    a. baseline CKD stage III  . Coronary artery disease   . GERD (gastroesophageal reflux disease)   . Headache(784.0)   . Heart block AV second degree 02/15/2013  . Heart murmur   . Hyperlipidemia   . Hypertension   . Mobitz (type) I (Wenckebach's) atrioventricular block    a. 01/2013 - asymptomatic.  . Morbid obesity ([place])   . Seizure disorder ([place])   . Seizures ([place])   . TIA (transient ischemic attack) 2014   pt stated she had "mini strokes"   Review of Systems:   Review of Systems  Constitutional: Negative for fever and weight loss.  Respiratory: Negative for cough and shortness of breath.   Cardiovascular: Negative for chest pain, palpitations, orthopnea, leg swelling and PND.  Gastrointestinal: Negative for constipation, diarrhea and nausea.  Genitourinary: Negative for dysuria, frequency and urgency.  Musculoskeletal: Positive for joint pain. Negative for falls.  Neurological: Positive for weakness (chronic). Negative for dizziness.   Physical Exam:  Constitution: NAD, appears stated age 32: bradycardic, V/VI murmur, no LE edema, no JVD  Respiratory: CTA, no w/r/r MSK: moving all extremities Neuro: normal affect, a&ox3 Skin: c/d/i   Vitals:   03/10/19 1342 03/10/19 1349  BP: (!) 196/70 (!) 178/64  Pulse: (!) 46 (!) 42  Temp: 98 F (36.7 C)   TempSrc: Oral   SpO2: 100%   Weight: 136 lb 6.4 oz (61.9 kg)   Height: 5\' 1"  (1.549 m)     Assessment & Plan:   See Encounters Tab for problem based charting.  Patient discussed with Dr. Dareen Piano

## 2019-03-10 NOTE — Assessment & Plan Note (Addendum)
Will need to hold alendronate for GFR < 35. She has been on this medication for about four years, ideally would continue until at least 10/2020. Last dexa showed T-score of -2.8 in 2017.   - BMP today, will hold alendronate if GFR <35 - with CKD consider PTH, vitamin D, and phosphorous levels at follow-up   ADDENDUM: GFR >35 but still on the lower side and fluctuating. Will decrease alendronate to 35 mg qweekly. Discussed results and plan with her and she agrees.

## 2019-03-10 NOTE — Assessment & Plan Note (Addendum)
BP Readings from Last 3 Encounters:  03/10/19 (!) 178/64  01/06/19 (!) 154/69  10/19/18 120/80   Current medications include amlodipine 10 mg qd, lisinopril 10 mg qd, and lasix 40  Mg qd.  Repeat blood pressure continues to be elevated. With her severe aortic stenosis and bradycardia will discuss with cardiology for next line therapy.   - discuss medications with Dr. Burt Knack  - bmp today - f/u one month   ADDENDUM: Discussed with Dr. Burt Knack and next best option would be chlorthalidone. However, after discussing with her, she checks BP at home and this runs [id number] systolic. Will have her keep track of bp at home and bring cuff to follow-up.

## 2019-03-11 LAB — BMP8+ANION GAP
Anion Gap: 12 mmol/L (ref 10.0–18.0)
BUN/Creatinine Ratio: 25 (ref 12–28)
BUN: 34 mg/dL — ABNORMAL HIGH (ref 8–27)
CO2: 20 mmol/L (ref 20–29)
Calcium: 8.5 mg/dL — ABNORMAL LOW (ref 8.7–10.3)
Chloride: 106 mmol/L (ref 96–106)
Creatinine, Ser: 1.35 mg/dL — ABNORMAL HIGH (ref 0.57–1.00)
GFR calc Af Amer: 45 mL/min/{1.73_m2} — ABNORMAL LOW (ref >59)
GFR calc non Af Amer: 39 mL/min/{1.73_m2} — ABNORMAL LOW (ref >59)
Glucose: 67 mg/dL (ref 65–99)
Potassium: 4.5 mmol/L (ref 3.5–5.2)
Sodium: 138 mmol/L (ref 134–144)

## 2019-03-11 NOTE — Progress Notes (Signed)
Internal Medicine Clinic Attending  Case discussed with Dr. Seawell at the time of the visit.  We reviewed the resident's history and exam and pertinent patient test results.  I agree with the assessment, diagnosis, and plan of care documented in the resident's note.    

## 2019-03-12 ENCOUNTER — Other Ambulatory Visit: Payer: Self-pay | Admitting: Internal Medicine

## 2019-03-12 DIAGNOSIS — M810 Age-related osteoporosis without current pathological fracture: Secondary | ICD-10-CM

## 2019-03-12 MED ORDER — ALENDRONATE SODIUM 70 MG PO TABS: 35.0000 mg | ORAL_TABLET | ORAL | 0 refills | Status: DC

## 2019-03-15 ENCOUNTER — Other Ambulatory Visit: Payer: Self-pay | Admitting: Internal Medicine

## 2019-03-15 DIAGNOSIS — M810 Age-related osteoporosis without current pathological fracture: Secondary | ICD-10-CM

## 2019-03-15 NOTE — Telephone Encounter (Signed)
Refill Request   alendronate (FOSAMAX) 70 MG tablet  [place], [place] - [place]

## 2019-03-16 MED ORDER — ALENDRONATE SODIUM 70 MG PO TABS: 35.0000 mg | ORAL_TABLET | ORAL | 0 refills | Status: DC

## 2019-03-16 NOTE — Addendum Note (Signed)
Addended by: Molli Hazard A on: 03/16/2019 03:10 PM   Modules accepted: Orders

## 2019-03-30 ENCOUNTER — Encounter: Payer: Self-pay | Admitting: Neurology

## 2019-03-31 ENCOUNTER — Encounter: Payer: Self-pay | Admitting: Neurology

## 2019-03-31 ENCOUNTER — Other Ambulatory Visit: Payer: Self-pay

## 2019-03-31 ENCOUNTER — Telehealth (INDEPENDENT_AMBULATORY_CARE_PROVIDER_SITE_OTHER): Payer: Medicare HMO | Admitting: Neurology

## 2019-03-31 VITALS — Ht 61.0 in | Wt 136.0 lb

## 2019-03-31 DIAGNOSIS — G40209 Localization-related (focal) (partial) symptomatic epilepsy and epileptic syndromes with complex partial seizures, not intractable, without status epilepticus: Secondary | ICD-10-CM | POA: Diagnosis not present

## 2019-03-31 MED ORDER — ZONISAMIDE 100 MG PO CAPS: ORAL_CAPSULE | ORAL | 3 refills | Status: DC

## 2019-03-31 MED ORDER — PHENOBARBITAL 64.8 MG PO TABS: 129.6000 mg | ORAL_TABLET | Freq: Every day | ORAL | 3 refills | Status: DC

## 2019-03-31 NOTE — Progress Notes (Signed)
Telephone (Audio) Visit The purpose of this telephone visit is to provide medical care while limiting exposure to the novel coronavirus.    Consent was obtained for telephone visit:  Yes.   Answered questions that patient had about telehealth interaction:  Yes.   I discussed the limitations, risks, security and privacy concerns of performing an evaluation and management service by telephone. I also discussed with the patient that there may be a patient responsible charge related to this service. The patient expressed understanding and agreed to proceed.  Pt location: Home Physician Location: office Name of referring provider:  [name], MD I connected with .Sherry [name] at patients initiation/request on 03/31/2019 at 11:30 AM EST by telephone and verified that I am speaking with the correct person using two identifiers.  Pt MRN:  [id number] Pt DOB:  [birth year]   History of Present Illness:  The patient had a telephone visit on 03/31/2019. She was last seen a year ago in the neurology clinic for right temporal lobe epilepsy. Since her last visit, she continues to do well, seizure-free since April 2018 when Zonisamide dose was increased to 400mg  qhs. She is also on Phenobarbtial 64.8mg  2 tabs qhs. She had her annual PCP visit last month with note of GFR around 30. On review of records, she was initially seen in our office in 2018, she had been started on Zonisamide at [facility] in 2015. GFR has been in the 40s since 2015, 30 in November 2020. The patient denies any staring/unresponsive episodes, gaps in time, olfactory/gustatory hallucinations, rising epigastric sensation, focal numbness/tingling/weakness. Her legs "don't work well" due to arthritis, no falls. She has a motorized wheelchair and walker. She does not drive. She denies any headaches, dizziness, sleep and mood are good.  History on Initial Assessment 05/20/2016: This is a pleasant 74 yo RH woman with a history of hypertension,  hyperlipidemia, AV block, CAD, and seizures. Records from [facility] were reviewed. She started having seizures in the 42s (age 36s), initially she was having generalized convulsions often preceded by a sensation of severe nausea, "sick to stomach," followed by eyes rolling up, shaking of both arms and legs with tongue bite. They were initially occurring every 2 weeks while on Dilantin, none since adding Phenobarbital. They report the last GTC was probably in her 66s (>10 years ago). Family would also note episodes of unresponsiveness where her head drops forward slightly and she does not respond. She would be amnestic of these and feel like she woke up from sleep. Her daughter previously reported she seemed confused afterwards. There is note that these resolved after phenobarbital reduction in 2013, however her husband reports occasional episodes where he would be talking to her, then he comes back in the room and she would look at him like they did not talk earlier, she would not understand and say "huh?" and he asks if she heard him. He last noticed this around 1.5 months ago, prior to that was 7-8 months ago. She continues to have the sensation of a sickening feeling every few months or so, these always seem to occur when she is in the bath. She had one 2 weeks ago, these last up to a minute, she denies any confusion or focal symptoms. She denies any olfactory/gustatory hallucinations, deja vu, focal numbness/tingling/weakness, myoclonic jerks. She notices her hand twitches sometimes. Her husband denies any nocturnal seizures.  She is currently taking Phenobarbital 64.8mg  2 tablets daily and Zonisamide 300mg  qhs. She was previously drowsy  on higher doses of Phenobarbital, and with reduction of dose, she currently denies any side effects to her medications. She denies any headaches, dizziness, diplopia, dysarthria/dysphagia, neck/back pain, bladder dysfunction. She has constipation. She needs a walker to  ambulate and denies any falls. She denies any focal symptoms but reports tingling in both hands and weakness in both legs, no pain. She reports sleep is good, and reports good compliance to medications. She reports a history of migraines, but these have resolved.  Epilepsy Risk Factors:  A maternal cousin had seizures when younger. Otherwise she had a normal birth and early development.  There is no history of febrile convulsions, CNS infections such as meningitis/encephalitis, significant traumatic brain injury, neurosurgical procedures.  Prior AEDs: Dilantin (ineffective) Laboratory Data: Phenobarbital level 03/13/16 - 28. BMP showed a creatinine of 1.32, BUN 28; vitamin D level in 09/2015 was 25.9 EEG per [facility] notes: mild diffuse slowing on routine EEG MRI per [facility] notes: showed evidence of old strokes 1-hour EEG: Occasional right temporal slowing, frequent right frontotemporal epileptiform discharges  MEDICATIONS: Current Outpatient Medications on File Prior to Visit  Medication Sig Dispense Refill  . alendronate (FOSAMAX) 70 MG tablet Take 0.5 tablets (35 mg total) by mouth once a week. TAKE 1/2 TABLET BY MOUTH ONCE WEEKLY ON AN EMPTY STOMACH BEFORE BREAKFAST. REMAIN UPRIGHT FOR 30 MINUTES & TAKE WITH 8 OUNCES OF WATER 12 tablet 0  . amLODipine (NORVASC) 10 MG tablet Take 1 tablet (10 mg total) by mouth daily. 90 tablet 3  . aspirin 81 MG EC tablet Take 81 mg by mouth daily.     . Calcium Carb-Cholecalciferol (CALCIUM 500 +D) 500-400 MG-UNIT TABS Take 1 tablet by mouth 2 (two) times daily. 60 tablet 2  . furosemide (LASIX) 40 MG tablet Take 1 tablet (40 mg total) by mouth daily. 90 tablet 2  . lisinopril (ZESTRIL) 40 MG tablet TAKE 1 TABLET BY MOUTH DAILY 90 tablet 0  . PHENobarbital (LUMINAL) 64.8 MG tablet TAKE 2 TABLETS BY MOUTH DAILY 180 tablet 3  . pravastatin (PRAVACHOL) 40 MG tablet TAKE ONE TABLET BY MOUTH DAILY (Patient taking differently: Take 40 mg by mouth at bedtime. ) 90  tablet 3  . zonisamide (ZONEGRAN) 100 MG capsule TAKE FOUR CAPSULES BY MOUTH DAILY 360 capsule 1   No current facility-administered medications on file prior to visit.     Observations/Objective:   Vitals:   03/30/19 1233  Weight: 136 lb (61.7 kg)  Height: 5\' 1"  (1.549 m)   Exam limited due to nature of telephone visit. Patient is awake, alert, able to answer questions without dysarthria or confusion.  Assessment and Plan:   This is a pleasant 74 yo RH woman with a history of  hypertension, hyperlipidemia, AV block, CAD, and right temporal lobe epilepsy. No convulsions since age 48, no further partial seizures since Zonisamide increased to 400mg  qhs in April 2018. She has been on Zonisamide since 2015 with baseline GFR in the 40s, however last November GFR went down to 30. Renal clearance decreases with decreased renal function. We discussed reducing Zonisamide back to 300mg  qhs. Continue Phenobarbital 64.8mg  2 tablets daily. If seizures recur, discussed that we will need to add on a different medication, she expressed understanding. She does not drive. Follow-up in 6 months, she knows to call for any changes.   Follow Up Instructions:   -I discussed the assessment and treatment plan with the patient. The patient was provided an opportunity to ask questions and all were  answered. The patient agreed with the plan and demonstrated an understanding of the instructions.   The patient was advised to call back or seek an in-person evaluation if the symptoms worsen or if the condition fails to improve as anticipated.    Total Time spent in visit with the patient was:  7:40 minutes, of which 100% of the time was spent in counseling and/or coordinating care on the above.   Pt understands and agrees with the plan of care outlined.     Cameron Sprang, MD

## 2019-04-02 DIAGNOSIS — K5900 Constipation, unspecified: Secondary | ICD-10-CM | POA: Diagnosis not present

## 2019-04-02 DIAGNOSIS — I110 Hypertensive heart disease with heart failure: Secondary | ICD-10-CM | POA: Diagnosis not present

## 2019-04-02 DIAGNOSIS — Z008 Encounter for other general examination: Secondary | ICD-10-CM | POA: Diagnosis not present

## 2019-04-02 DIAGNOSIS — K219 Gastro-esophageal reflux disease without esophagitis: Secondary | ICD-10-CM | POA: Diagnosis not present

## 2019-04-02 DIAGNOSIS — E785 Hyperlipidemia, unspecified: Secondary | ICD-10-CM | POA: Diagnosis not present

## 2019-04-02 DIAGNOSIS — I739 Peripheral vascular disease, unspecified: Secondary | ICD-10-CM | POA: Diagnosis not present

## 2019-04-02 DIAGNOSIS — R32 Unspecified urinary incontinence: Secondary | ICD-10-CM | POA: Diagnosis not present

## 2019-04-02 DIAGNOSIS — I509 Heart failure, unspecified: Secondary | ICD-10-CM | POA: Diagnosis not present

## 2019-04-02 DIAGNOSIS — G3184 Mild cognitive impairment, so stated: Secondary | ICD-10-CM | POA: Diagnosis not present

## 2019-04-02 DIAGNOSIS — M810 Age-related osteoporosis without current pathological fracture: Secondary | ICD-10-CM | POA: Diagnosis not present

## 2019-04-02 DIAGNOSIS — G40909 Epilepsy, unspecified, not intractable, without status epilepticus: Secondary | ICD-10-CM | POA: Diagnosis not present

## 2019-04-03 ENCOUNTER — Other Ambulatory Visit: Payer: Self-pay | Admitting: Internal Medicine

## 2019-04-27 ENCOUNTER — Other Ambulatory Visit: Payer: Self-pay | Admitting: *Deleted

## 2019-04-27 MED ORDER — PRAVASTATIN SODIUM 40 MG PO TABS: 40.0000 mg | ORAL_TABLET | Freq: Every day | ORAL | 1 refills | Status: DC

## 2019-04-28 ENCOUNTER — Telehealth: Payer: Self-pay

## 2019-04-28 NOTE — Telephone Encounter (Signed)
error 

## 2019-05-06 DIAGNOSIS — R269 Unspecified abnormalities of gait and mobility: Secondary | ICD-10-CM | POA: Diagnosis not present

## 2019-05-18 ENCOUNTER — Other Ambulatory Visit: Payer: Self-pay | Admitting: Internal Medicine

## 2019-05-18 DIAGNOSIS — M810 Age-related osteoporosis without current pathological fracture: Secondary | ICD-10-CM

## 2019-05-18 DIAGNOSIS — I10 Essential (primary) hypertension: Secondary | ICD-10-CM

## 2019-05-25 ENCOUNTER — Other Ambulatory Visit: Payer: Self-pay | Admitting: Internal Medicine

## 2019-05-25 DIAGNOSIS — I5032 Chronic diastolic (congestive) heart failure: Secondary | ICD-10-CM

## 2019-06-10 ENCOUNTER — Ambulatory Visit
Admission: RE | Admit: 2019-06-10 | Discharge: 2019-06-10 | Disposition: A | Discharge status: Home / Self Care | Payer: Medicare HMO | Source: Ambulatory Visit | Attending: Internal Medicine | Admitting: Internal Medicine

## 2019-06-10 ENCOUNTER — Other Ambulatory Visit: Payer: Self-pay

## 2019-06-10 DIAGNOSIS — Z1231 Encounter for screening mammogram for malignant neoplasm of breast: Secondary | ICD-10-CM | POA: Diagnosis not present

## 2019-08-11 ENCOUNTER — Other Ambulatory Visit: Payer: Self-pay | Admitting: Internal Medicine

## 2019-08-11 DIAGNOSIS — I10 Essential (primary) hypertension: Secondary | ICD-10-CM

## 2019-08-24 ENCOUNTER — Telehealth: Payer: Self-pay | Admitting: Internal Medicine

## 2019-08-24 NOTE — Telephone Encounter (Signed)
Pt is requesting physician contact her; pt is wanting to travel [phone]

## 2019-08-24 NOTE — Telephone Encounter (Signed)
Called pt, she wants to know if she can go to [employer], about 55min to 1 hr away from [place] in august, cautioned her to wear a mask and maintain 6 ft social distance. No hugging, kissing.not to touch face and to stay away from anyone sick. She is agreeable.she has not had the vaccine Do you agree?

## 2019-08-25 NOTE — Telephone Encounter (Signed)
I agree. Thank you.

## 2019-08-30 ENCOUNTER — Other Ambulatory Visit: Payer: Self-pay | Admitting: Neurology

## 2019-08-30 ENCOUNTER — Other Ambulatory Visit: Payer: Self-pay

## 2019-08-30 DIAGNOSIS — G40209 Localization-related (focal) (partial) symptomatic epilepsy and epileptic syndromes with complex partial seizures, not intractable, without status epilepticus: Secondary | ICD-10-CM

## 2019-08-30 MED ORDER — PHENOBARBITAL 64.8 MG PO TABS: 129.6000 mg | ORAL_TABLET | Freq: Every day | ORAL | 3 refills | Status: DC

## 2019-08-31 ENCOUNTER — Other Ambulatory Visit: Payer: Self-pay | Admitting: Neurology

## 2019-08-31 DIAGNOSIS — G40209 Localization-related (focal) (partial) symptomatic epilepsy and epileptic syndromes with complex partial seizures, not intractable, without status epilepticus: Secondary | ICD-10-CM

## 2019-08-31 MED ORDER — PHENOBARBITAL 64.8 MG PO TABS: 129.6000 mg | ORAL_TABLET | Freq: Every day | ORAL | 3 refills | Status: DC

## 2019-09-01 ENCOUNTER — Other Ambulatory Visit: Payer: Self-pay

## 2019-09-01 ENCOUNTER — Other Ambulatory Visit: Payer: Self-pay | Admitting: Neurology

## 2019-09-01 DIAGNOSIS — G40209 Localization-related (focal) (partial) symptomatic epilepsy and epileptic syndromes with complex partial seizures, not intractable, without status epilepticus: Secondary | ICD-10-CM

## 2019-09-01 NOTE — Telephone Encounter (Signed)
This RX was filled by [facility] yesterday via Germaine [facility]. SChaplin, RN,BSN

## 2019-09-01 NOTE — Telephone Encounter (Signed)
PHENobarbital (LUMINAL) 64.8 MG tablet, REFILL REQUEST @  [facility] [address] [place] Phone:  [phone]  Fax:  [phone]

## 2019-09-03 ENCOUNTER — Telehealth: Payer: Self-pay | Admitting: Neurology

## 2019-09-03 ENCOUNTER — Other Ambulatory Visit: Payer: Self-pay

## 2019-09-03 DIAGNOSIS — G40209 Localization-related (focal) (partial) symptomatic epilepsy and epileptic syndromes with complex partial seizures, not intractable, without status epilepticus: Secondary | ICD-10-CM

## 2019-09-03 MED ORDER — PHENOBARBITAL 64.8 MG PO TABS: 129.6000 mg | ORAL_TABLET | Freq: Every day | ORAL | 3 refills | Status: DC

## 2019-09-03 NOTE — Telephone Encounter (Signed)
PHARMACY CALLED VERBAL GIVEN

## 2019-09-03 NOTE — Telephone Encounter (Signed)
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

## 2019-09-07 ENCOUNTER — Other Ambulatory Visit: Payer: Self-pay | Admitting: Internal Medicine

## 2019-09-07 DIAGNOSIS — I10 Essential (primary) hypertension: Secondary | ICD-10-CM

## 2019-09-10 NOTE — Telephone Encounter (Signed)
Spoke with the patient.  Appt sch with PCP for 10/26/2019.

## 2019-09-23 ENCOUNTER — Encounter: Payer: Self-pay | Admitting: *Deleted

## 2019-10-07 ENCOUNTER — Other Ambulatory Visit: Payer: Self-pay

## 2019-10-07 ENCOUNTER — Ambulatory Visit (HOSPITAL_COMMUNITY): Payer: Medicare HMO | Attending: Cardiology

## 2019-10-07 DIAGNOSIS — I350 Nonrheumatic aortic (valve) stenosis: Secondary | ICD-10-CM | POA: Diagnosis not present

## 2019-10-07 LAB — ECHOCARDIOGRAM COMPLETE
AR max vel: 0.93 cm2
AV Area VTI: 0.98 cm2
AV Area mean vel: 1.03 cm2
AV Mean grad: 21.7 mmHg
AV Peak grad: 42.8 mmHg
Ao pk vel: 3.27 m/s
Area-P 1/2: 2.17 cm2
S' Lateral: 2.2 cm

## 2019-10-11 ENCOUNTER — Encounter: Payer: Self-pay | Admitting: Cardiovascular Disease

## 2019-10-11 ENCOUNTER — Ambulatory Visit (INDEPENDENT_AMBULATORY_CARE_PROVIDER_SITE_OTHER): Payer: Medicare HMO | Admitting: Cardiovascular Disease

## 2019-10-11 ENCOUNTER — Other Ambulatory Visit: Payer: Self-pay

## 2019-10-11 ENCOUNTER — Encounter: Payer: Self-pay | Admitting: *Deleted

## 2019-10-11 VITALS — BP 124/58 | HR 46 | Ht 61.0 in | Wt 139.4 lb

## 2019-10-11 DIAGNOSIS — I350 Nonrheumatic aortic (valve) stenosis: Secondary | ICD-10-CM

## 2019-10-11 DIAGNOSIS — R001 Bradycardia, unspecified: Secondary | ICD-10-CM

## 2019-10-11 DIAGNOSIS — I2510 Atherosclerotic heart disease of native coronary artery without angina pectoris: Secondary | ICD-10-CM

## 2019-10-11 NOTE — Progress Notes (Signed)
Cardiology Office Note:    Date:  10/11/2019   ID:  Sherry, [name] August 22, 1945, MRN [id number]  PCP:  Marty Heck, DO  [place] Cardiologist:  Sherren Mocha, MD  [place] Electrophysiologist:  None   Referring MD: Marty Heck, DO   Chief Complaint  Patient presents with  . Aortic Stenosis    History of Present Illness:    Sherry [name] is a 74 y.o. female with a hx of chronic diastolic heart failure, severe concentric left ventricular hypertrophy, multivessel coronary artery disease, chronic bradycardia, and aortic stenosis.  The patient presents for follow-up evaluation today.  She is here with her husband.  She remains physically quite limited and is essentially wheelchair-bound.  She does get up for short periods of time when she is cooking or cleaning.  She continues to deny any symptoms of chest pain, chest pressure, shortness of breath, fatigue, lightheadedness, or syncope.  She denies leg swelling, orthopnea, or PND.  She has had no interval change in symptoms since her last evaluation here 1 year ago.  Past Medical History:  Diagnosis Date  . Anemia   . Aortic stenosis   . Arthritis   . Bilateral lower extremity edema   . Bradycardia    a. 01/2013 - asymptomatic.  [name] Chronic diastolic heart failure ([place])   . CKD (chronic kidney disease)    a. baseline CKD stage III  . Coronary artery disease   . GERD (gastroesophageal reflux disease)   . Headache(784.0)   . Heart block AV second degree 02/15/2013  . Heart murmur   . Hyperlipidemia   . Hypertension   . Mobitz (type) I (Wenckebach's) atrioventricular block    a. 01/2013 - asymptomatic.  . Morbid obesity ([place])   . Seizure disorder ([place])   . Seizures ([place])   . TIA (transient ischemic attack) 2014   pt stated she had "mini strokes"    Past Surgical History:  Procedure Laterality Date  . ABDOMINAL HYSTERECTOMY    . LEFT AND RIGHT HEART CATHETERIZATION WITH CORONARY ANGIOGRAM N/A  05/24/2013   Procedure: LEFT AND RIGHT HEART CATHETERIZATION WITH CORONARY ANGIOGRAM;  Surgeon: Sinclair Grooms, MD;  Location: [facility] CATH LAB;  Service: Cardiovascular;  Laterality: N/A;    Current Medications: Current Meds  Medication Sig  . alendronate (FOSAMAX) 70 MG tablet Take 0.5 tablets (35 mg total) by mouth once a week.  [name] amLODipine (NORVASC) 10 MG tablet TAKE ONE TABLET BY MOUTH DAILY  . aspirin 81 MG EC tablet Take 81 mg by mouth daily.   . Calcium Carb-Cholecalciferol (CALCIUM 500 +D) 500-400 MG-UNIT TABS Take 1 tablet by mouth 2 (two) times daily.  . furosemide (LASIX) 40 MG tablet TAKE 1 TABLET BY MOUTH DAILY  . lisinopril (ZESTRIL) 40 MG tablet TAKE ONE TABLET BY MOUTH DAILY  . PHENobarbital (LUMINAL) 64.8 MG tablet Take 2 tablets (129.6 mg total) by mouth daily.  . pravastatin (PRAVACHOL) 40 MG tablet Take 1 tablet (40 mg total) by mouth at bedtime.  [name] zonisamide (ZONEGRAN) 100 MG capsule Take 3 capsules every night     Allergies:   Ibuprofen   Social History   Socioeconomic History  . Marital status: Married    Spouse name: Not on file  . Number of children: 2  . Years of education: 4  . Highest education level: Not on file  Occupational History  . Occupation: retired  Tobacco Use  . Smoking status: Former Smoker    Types:  Cigarettes    Quit date: 06/03/2000    Years since quitting: 19.3  . Smokeless tobacco: Never Used  Vaping Use  . Vaping Use: Never used  Substance and Sexual Activity  . Alcohol use: No    Alcohol/week: 0.0 standard drinks  . Drug use: No  . Sexual activity: Not Currently    Partners: Male  Other Topics Concern  . Not on file  Social History Narrative   Right handed   One story home   Social Determinants of Health   Financial Resource Strain:   . Difficulty of Paying Living Expenses: Not on file  Food Insecurity:   . Worried About Charity fundraiser in the Last Year: Not on file  . Ran Out of Food in the Last Year: Not on  file  Transportation Needs:   . Lack of Transportation (Medical): Not on file  . Lack of Transportation (Non-Medical): Not on file  Physical Activity:   . Days of Exercise per Week: Not on file  . Minutes of Exercise per Session: Not on file  Stress:   . Feeling of Stress : Not on file  Social Connections:   . Frequency of Communication with Friends and Family: Not on file  . Frequency of Social Gatherings with Friends and Family: Not on file  . Attends Religious Services: Not on file  . Active Member of Clubs or Organizations: Not on file  . Attends Archivist Meetings: Not on file  . Marital Status: Not on file     Family History: The patient's family history includes Hypertension in an other family member.  ROS:   Please see the history of present illness.    All other systems reviewed and are negative.  EKGs/Labs/Other Studies Reviewed:    The following studies were reviewed today: Echo 10/07/2019: IMPRESSIONS    1. Left ventricular ejection fraction, by estimation, is 65 to 70%. The  left ventricle has normal function. The left ventricle has no regional  wall motion abnormalities. There is severe concentric left ventricular  hypertrophy. Left ventricular diastolic  parameters are consistent with Grade II diastolic dysfunction  (pseudonormalization). Elevated left ventricular end-diastolic pressure.  2. Right ventricular systolic function is normal. The right ventricular  size is normal. Moderately increased right ventricular wall thickness.  Tricuspid regurgitation signal is inadequate for assessing PA pressure.  3. Left atrial size was moderately dilated.  4. Right atrial size was mildly dilated.  5. The mitral valve is abnormal. Mild mitral valve regurgitation. No  evidence of mitral stenosis.  6. The aortic valve is abnormal. Aortic valve regurgitation is not  visualized. Moderate to severe aortic valve stenosis.  7. The inferior vena cava is  normal in size with greater than 50%  respiratory variability, suggesting right atrial pressure of 3 mmHg.   Comparison(s): No significant change from prior study.   Conclusion(s)/Recommendation(s): Severe LVH, reviewed that prior PYP did  not suggest amyloid but cMRI did. Small LV cavity with near obliteration  at the apex in systole. Moderate to severe AS (moderate by gradients,  severe by AVA). Agree with prior  concern for low-flow, low-gradient AS.   Cardiac MRI 12-24-2017: FINDINGS: Limited images of the lung fields showed no gross abnormalities.  There was a moderate, circumferential pericardial effusion without evidence for tamponade.  There was normal left ventricular size with moderate concentric LV hypertrophy, EF 76% with no wall motion abnormalities. Normal right ventricular size and systolic function, EF 16%. Mild left atrial  enlargement. Normal right atrial size. There was mitral annular calcification with mild mitral regurgitation. The aortic valve was calcified and restricted. Degree of stenosis is better estimated by echo.  On delayed enhancement imaging, the myocardium was difficult to null. There was a suggestion of diffuse subendocardial late gadolinium enhancement (LGE).  Measurements:  LVEDV 155 mL  LVSV 118 mL  LVEF 76%  RVEDV 124 mL  RVSV 57 mL  RVEF 46%  IMPRESSION: 1. This study is suggestive of cardiac amyloidosis. There is a moderate pericardial effusion (without tamponade), moderate concentric LV hypertrophy, and on delayed enhancement imaging, the myocardium is difficult to null with a suggestion of diffuse subendocardial LGE.  2. There is aortic stenosis present, the degree would be better assessed by echo.  EKG:  EKG is ordered today.  The ekg ordered today demonstrates marked sinus bradycardia 46 bpm, first-degree AV block with PR interval 334 ms  Recent Labs: 01/05/2019: Hemoglobin 12.6; Platelets 168 03/10/2019:  BUN 34; Creatinine, Ser 1.35; Potassium 4.5; Sodium 138  Recent Lipid Panel    Component Value Date/Time   CHOL 174 01/16/2015 1040   TRIG 58 01/16/2015 1040   HDL 76 01/16/2015 1040   CHOLHDL 2.3 01/16/2015 1040   VLDL 12 01/16/2015 1040   LDLCALC 86 01/16/2015 1040    Physical Exam:    VS:  BP (!) 124/58   Pulse (!) 46   Ht 5\' 1"  (1.549 m)   Wt 139 lb 6.4 oz (63.2 kg)   SpO2 99%   BMI 26.34 kg/m     Wt Readings from Last 3 Encounters:  10/11/19 139 lb 6.4 oz (63.2 kg)  03/30/19 136 lb (61.7 kg)  03/10/19 136 lb 6.4 oz (61.9 kg)     GEN: Pleasant, elderly appearing woman, in a wheelchair, in no acute distress HEENT: Normal NECK: No JVD; No carotid bruits LYMPHATICS: No lymphadenopathy CARDIAC: RRR, 3/6 harsh late peaking systolic murmur at the right upper sternal border RESPIRATORY:  Clear to auscultation without rales, wheezing or rhonchi  ABDOMEN: Soft, non-tender, non-distended MUSCULOSKELETAL:  No edema; No deformity  SKIN: Warm and dry NEUROLOGIC:  Alert and oriented x 3 PSYCHIATRIC:  Normal affect   ASSESSMENT:    1. Bradycardia   2. Severe aortic stenosis   3. Coronary artery disease involving native coronary artery of native heart without angina pectoris    PLAN:    In order of problems listed above:  1. The patient has significant conduction disease.  She has had transient heart block in the past but none recently documented.  She has very long PR interval and is markedly bradycardic.  Remarkably, she has absolutely no symptoms.  I have recommended a 72-hour outpatient ZIO monitor. 2. I have reviewed the patient's most recent echocardiogram.  I believe she has severe, stage D3 (paradoxical low flow low gradient) aortic stenosis.  She has severe concentric LVH with a small LV cavity and low stroke-volume index.  The aortic valve is severely calcified and restricted.  Again, the patient denies any symptoms.  She has markedly limited functional capacity.  In  the presence of aortic stenosis, coronary artery disease, and poor functional capacity, she prefers ongoing observation considering her lack of symptoms.  I think this is a reasonable approach.  I discussed options at length with her today and advised her that we would normally be considering both permanent pacing and TAVR, but with her lack of symptoms we agree that we will continue to follow her for now.  I will see her back in 1 year with an echocardiogram at that time.  She understands to contact me if she has any problems.  In addition, the patient may have cardiac amyloid.  She has tested negative for transthyretin amyloid, but her MRI has been suggestive of amyloidosis.  She was referred to the advanced heart failure clinic a few years ago and did not follow through with the consultation. 3. No anginal symptoms.  Continue current management.   Medication Adjustments/Labs and Tests Ordered: Current medicines are reviewed at length with the patient today.  Concerns regarding medicines are outlined above.  Orders Placed This Encounter  Procedures  . LONG TERM MONITOR (3-14 DAYS)  . EKG 12-Lead   No orders of the defined types were placed in this encounter.   Patient Instructions  Medication Instructions:  Your provider recommends that you continue on your current medications as directed. Please refer to the Current Medication list given to you today.   *If you need a refill on your cardiac medications before your next appointment, please call your pharmacy*  Testing/Procedures: Dr. Burt Knack recommends you wear a 3 [place].  Follow-Up: At [facility], you and your health needs are our priority.  As part of our continuing mission to provide you with exceptional heart care, we have created designated Provider Care Teams.  These Care Teams include your primary Cardiologist (physician) and Advanced Practice Providers (APPs -  Physician Assistants and Nurse Practitioners) who all work  together to provide you with the care you need, when you need it. Your next appointment:   12 month(s) The format for your next appointment:   In person Provider:   You may see Sherren Mocha, MD or one of the following Advanced Practice Providers on your designated Care Team:    Richardson Dopp, PA-C  Vin Bhagat, PA-C     [place] Monitor Instructions   Your physician has requested you wear your ZIO patch monitor 3 days.   This is a single patch monitor.  [employer] supplies one patch monitor per enrollment.  Additional stickers are not available.   Please do not apply patch if you will be having a Nuclear Stress Test, Echocardiogram, Cardiac CT, MRI, or Chest Xray during the time frame you would be wearing the monitor. The patch cannot be worn during these tests.  You cannot remove and re-apply the ZIO XT patch monitor.   Your ZIO patch monitor will be sent USPS Priority mail from [facility] directly to your home address. The monitor may also be mailed to a PO BOX if home delivery is not available.   It may take 3-5 days to receive your monitor after you have been enrolled.   Once you have received you monitor, please review enclosed instructions.  Your monitor has already been registered assigning a specific monitor serial # to you.   Applying the monitor   Shave hair from upper left chest.   Hold abrader disc by orange tab.  Rub abrader in 40 strokes over left upper chest as indicated in your monitor instructions.   Clean area with 4 enclosed alcohol pads .  Use all pads to assure are is cleaned thoroughly.  Let dry.   Apply patch as indicated in monitor instructions.  Patch will be place under collarbone on left side of chest with arrow pointing upward.   Rub patch adhesive wings for 2 minutes.Remove white label marked "1".  Remove white label marked "2".  Rub patch adhesive wings for 2 additional minutes.   While looking in a mirror, press and release button  in center of patch.  A small green light will flash 3-4 times .  This will be your only indicator the monitor has been turned on.     Do not shower for the first 24 hours.  You may shower after the first 24 hours.   Press button if you feel a symptom. You will hear a small click.  Record Date, Time and Symptom in the Patient Log Book.   When you are ready to remove patch, follow instructions on last 2 pages of Patient Log Book.  Stick patch monitor onto last page of Patient Log Book.   Place Patient Log Book in [place] box.  Use locking tab on box and tape box closed securely.  The [employer] and [employer] has [employer] on it.  Please place in mailbox as soon as possible.  Your physician should have your test results approximately 7 days after the monitor has been mailed back to [facility].   Call [place] at [phone] if you have questions regarding your ZIO XT patch monitor.  Call them immediately if you see an orange light blinking on your monitor.   If your monitor falls off in less than 4 days contact our Monitor department at [phone].  If your monitor becomes loose or falls off after 4 days call [employer] at [phone] for suggestions on securing your monitor.      Signed, Sherren Mocha, MD  10/11/2019 1:49 PM    [facility]

## 2019-10-11 NOTE — Patient Instructions (Signed)
Medication Instructions:  Your provider recommends that you continue on your current medications as directed. Please refer to the Current Medication list given to you today.   *If you need a refill on your cardiac medications before your next appointment, please call your pharmacy*  Testing/Procedures: Dr. Burt Knack recommends you wear a 3 [place].  Follow-Up: At [facility], you and your health needs are our priority.  As part of our continuing mission to provide you with exceptional heart care, we have created designated Provider Care Teams.  These Care Teams include your primary Cardiologist (physician) and Advanced Practice Providers (APPs -  Physician Assistants and Nurse Practitioners) who all work together to provide you with the care you need, when you need it. Your next appointment:   12 month(s) The format for your next appointment:   In person Provider:   You may see Sherren Mocha, MD or one of the following Advanced Practice Providers on your designated Care Team:    Richardson Dopp, PA-C  Vin Bhagat, PA-C     [place] Monitor Instructions   Your physician has requested you wear your ZIO patch monitor 3 days.   This is a single patch monitor.  [employer] supplies one patch monitor per enrollment.  Additional stickers are not available.   Please do not apply patch if you will be having a Nuclear Stress Test, Echocardiogram, Cardiac CT, MRI, or Chest Xray during the time frame you would be wearing the monitor. The patch cannot be worn during these tests.  You cannot remove and re-apply the ZIO XT patch monitor.   Your ZIO patch monitor will be sent USPS Priority mail from [facility] directly to your home address. The monitor may also be mailed to a PO BOX if home delivery is not available.   It may take 3-5 days to receive your monitor after you have been enrolled.   Once you have received you monitor, please review enclosed instructions.  Your  monitor has already been registered assigning a specific monitor serial # to you.   Applying the monitor   Shave hair from upper left chest.   Hold abrader disc by orange tab.  Rub abrader in 40 strokes over left upper chest as indicated in your monitor instructions.   Clean area with 4 enclosed alcohol pads .  Use all pads to assure are is cleaned thoroughly.  Let dry.   Apply patch as indicated in monitor instructions.  Patch will be place under collarbone on left side of chest with arrow pointing upward.   Rub patch adhesive wings for 2 minutes.Remove white label marked "1".  Remove white label marked "2".  Rub patch adhesive wings for 2 additional minutes.   While looking in a mirror, press and release button in center of patch.  A small green light will flash 3-4 times .  This will be your only indicator the monitor has been turned on.     Do not shower for the first 24 hours.  You may shower after the first 24 hours.   Press button if you feel a symptom. You will hear a small click.  Record Date, Time and Symptom in the Patient Log Book.   When you are ready to remove patch, follow instructions on last 2 pages of Patient Log Book.  Stick patch monitor onto last page of Patient Log Book.   Place Patient Log Book in [place] box.  Use locking tab on box and tape  box closed securely.  The [employer] and [employer] has [employer] on it.  Please place in mailbox as soon as possible.  Your physician should have your test results approximately 7 days after the monitor has been mailed back to [facility].   Call [place] at [phone] if you have questions regarding your ZIO XT patch monitor.  Call them immediately if you see an orange light blinking on your monitor.   If your monitor falls off in less than 4 days contact our Monitor department at [phone].  If your monitor becomes loose or falls off after 4 days call [employer] at [phone] for suggestions on  securing your monitor.

## 2019-10-11 NOTE — Progress Notes (Signed)
Patient ID: Sherry [name], female   DOB: 25-Feb-1945, 74 y.o.   MRN: [id number] Patient enrolled for [employer] to ship a 3 day ZIO XT long term holter monitor to her home.

## 2019-10-15 ENCOUNTER — Other Ambulatory Visit (INDEPENDENT_AMBULATORY_CARE_PROVIDER_SITE_OTHER): Payer: Medicare HMO

## 2019-10-15 DIAGNOSIS — I350 Nonrheumatic aortic (valve) stenosis: Secondary | ICD-10-CM | POA: Diagnosis not present

## 2019-10-15 DIAGNOSIS — R001 Bradycardia, unspecified: Secondary | ICD-10-CM

## 2019-10-20 ENCOUNTER — Other Ambulatory Visit: Payer: Self-pay

## 2019-10-20 ENCOUNTER — Encounter: Payer: Self-pay | Admitting: Neurology

## 2019-10-20 ENCOUNTER — Ambulatory Visit (INDEPENDENT_AMBULATORY_CARE_PROVIDER_SITE_OTHER): Payer: Medicare HMO | Admitting: Neurology

## 2019-10-20 VITALS — BP 145/65 | HR 50 | Ht 61.0 in | Wt 139.0 lb

## 2019-10-20 DIAGNOSIS — G40209 Localization-related (focal) (partial) symptomatic epilepsy and epileptic syndromes with complex partial seizures, not intractable, without status epilepticus: Secondary | ICD-10-CM | POA: Diagnosis not present

## 2019-10-20 MED ORDER — ZONISAMIDE 100 MG PO CAPS: ORAL_CAPSULE | ORAL | 3 refills | Status: DC

## 2019-10-20 MED ORDER — PHENOBARBITAL 64.8 MG PO TABS: 129.6000 mg | ORAL_TABLET | Freq: Every day | ORAL | 3 refills | Status: DC

## 2019-10-20 NOTE — Progress Notes (Signed)
NEUROLOGY FOLLOW UP OFFICE NOTE  Sherry [name] [id number] Jun 26, 1945  HISTORY OF PRESENT ILLNESS: I had the pleasure of seeing Sherry [name] in follow-up in the neurology clinic on 10/20/2019.  The patient was last evaluated 7 months ago in the neurology clinic by telephone for right temporal lobe epilepsy. No convulsions since age 74, last partial seizure was in 2018. On her last visit, we discussed reducing Zonisamide to 300mg  qhs due to increasing GFR. Her GFR has been in the 40s since 2015, she has been on Zonisamide since 2015 [facility] at [facility]. She is also on Phenobarbital 64.8mg  2 tabs qhs. Last GFR in January 2021 was 38. She has a follow-up next week with Dr. Sharon Seller. She and her husband deny any seizures with reduction in Zonisamide dose. They deny any staring/unresponsive episodes, gaps in time, olfactory/gustatory hallucinations, focal numbness/tingling/weakness, myoclonic jerks. No headaches, dizziness, no falls. She is mostly in her motorized wheelchair because her legs "don't want to work." Sleep and mood are good. No side effects on medications. She manages her own medications, her husband makes sure she takes them.   History on Initial Assessment 05/20/2016: This is a pleasant 74 yo RH woman with a history of hypertension, hyperlipidemia, AV block, CAD, and seizures. Records from [facility] were reviewed. She started having seizures in the 87s (age 38s), initially she was having generalized convulsions often preceded by a sensation of severe nausea, "sick to stomach," followed by eyes rolling up, shaking of both arms and legs with tongue bite. They were initially occurring every 2 weeks while on Dilantin, none since adding Phenobarbital. They report the last GTC was probably in her 15s (>10 years ago). Family would also note episodes of unresponsiveness where her head drops forward slightly and she does not respond. She would be amnestic of these and feel like she woke up from  sleep. Her daughter previously reported she seemed confused afterwards. There is note that these resolved after phenobarbital reduction in 2013, however her husband reports occasional episodes where he would be talking to her, then he comes back in the room and she would look at him like they did not talk earlier, she would not understand and say "huh?" and he asks if she heard him. He last noticed this around 1.5 months ago, prior to that was 7-8 months ago. She continues to have the sensation of a sickening feeling every few months or so, these always seem to occur when she is in the bath. She had one 2 weeks ago, these last up to a minute, she denies any confusion or focal symptoms. She denies any olfactory/gustatory hallucinations, deja vu, focal numbness/tingling/weakness, myoclonic jerks. She notices her hand twitches sometimes. Her husband denies any nocturnal seizures.  She is currently taking Phenobarbital 64.8mg  2 tablets daily and Zonisamide 300mg  qhs. She was previously drowsy on higher doses of Phenobarbital, and with reduction of dose, she currently denies any side effects to her medications. She denies any headaches, dizziness, diplopia, dysarthria/dysphagia, neck/back pain, bladder dysfunction. She has constipation. She needs a walker to ambulate and denies any falls. She denies any focal symptoms but reports tingling in both hands and weakness in both legs, no pain. She reports sleep is good, and reports good compliance to medications. She reports a history of migraines, but these have resolved.  Epilepsy Risk Factors:  A maternal cousin had seizures when younger. Otherwise she had a normal birth and early development.  There is no history of febrile convulsions, CNS infections  such as meningitis/encephalitis, significant traumatic brain injury, neurosurgical procedures.  Prior AEDs: Dilantin (ineffective) Laboratory Data: Phenobarbital level 03/13/16 - 28. BMP showed a creatinine of 1.32,  BUN 28; vitamin D level in 09/2015 was 25.9 EEG per [facility] notes: mild diffuse slowing on routine EEG MRI per [facility] notes: showed evidence of old strokes 1-hour EEG: Occasional right temporal slowing, frequent right frontotemporal epileptiform discharges   PAST MEDICAL HISTORY: Past Medical History:  Diagnosis Date  . Anemia   . Aortic stenosis   . Arthritis   . Bilateral lower extremity edema   . Bradycardia    a. 01/2013 - asymptomatic.  [name] Chronic diastolic heart failure ([place])   . CKD (chronic kidney disease)    a. baseline CKD stage III  . Coronary artery disease   . GERD (gastroesophageal reflux disease)   . Headache(784.0)   . Heart block AV second degree 02/15/2013  . Heart murmur   . Hyperlipidemia   . Hypertension   . Mobitz (type) I (Wenckebach's) atrioventricular block    a. 01/2013 - asymptomatic.  . Morbid obesity ([place])   . Seizure disorder ([place])   . Seizures ([place])   . TIA (transient ischemic attack) 2014   pt stated she had "mini strokes"    MEDICATIONS: Current Outpatient Medications on File Prior to Visit  Medication Sig Dispense Refill  . alendronate (FOSAMAX) 70 MG tablet Take 0.5 tablets (35 mg total) by mouth once a week. 20 tablet 0  . amLODipine (NORVASC) 10 MG tablet TAKE ONE TABLET BY MOUTH DAILY 90 tablet 2  . aspirin 81 MG EC tablet Take 81 mg by mouth daily.     . Calcium Carb-Cholecalciferol (CALCIUM 500 +D) 500-400 MG-UNIT TABS Take 1 tablet by mouth 2 (two) times daily. 60 tablet 2  . furosemide (LASIX) 40 MG tablet TAKE 1 TABLET BY MOUTH DAILY 90 tablet 1  . lisinopril (ZESTRIL) 40 MG tablet TAKE ONE TABLET BY MOUTH DAILY 90 tablet 0  . PHENobarbital (LUMINAL) 64.8 MG tablet Take 2 tablets (129.6 mg total) by mouth daily. 180 tablet 3  . pravastatin (PRAVACHOL) 40 MG tablet Take 1 tablet (40 mg total) by mouth at bedtime. 90 tablet 1  . zonisamide (ZONEGRAN) 100 MG capsule Take 3 capsules every night 270 capsule 3   No current  facility-administered medications on file prior to visit.    ALLERGIES: Allergies  Allergen Reactions  . Ibuprofen Other (See Comments)    Dr advised pt not to take ibuprofen    FAMILY HISTORY: Family History  Problem Relation Age of Onset  . Hypertension Other     SOCIAL HISTORY: Social History   Socioeconomic History  . Marital status: Married    Spouse name: Not on file  . Number of children: 2  . Years of education: 4  . Highest education level: Not on file  Occupational History  . Occupation: retired  Tobacco Use  . Smoking status: Former Smoker    Types: Cigarettes    Quit date: 06/03/2000    Years since quitting: 19.3  . Smokeless tobacco: Never Used  Vaping Use  . Vaping Use: Never used  Substance and Sexual Activity  . Alcohol use: No    Alcohol/week: 0.0 standard drinks  . Drug use: No  . Sexual activity: Not Currently    Partners: Male  Other Topics Concern  . Not on file  Social History Narrative   Right handed   One story home   Drinks no caffeine  Social Determinants of Health   Financial Resource Strain:   . Difficulty of Paying Living Expenses: Not on file  Food Insecurity:   . Worried About Charity fundraiser in the Last Year: Not on file  . Ran Out of Food in the Last Year: Not on file  Transportation Needs:   . Lack of Transportation (Medical): Not on file  . Lack of Transportation (Non-Medical): Not on file  Physical Activity:   . Days of Exercise per Week: Not on file  . Minutes of Exercise per Session: Not on file  Stress:   . Feeling of Stress : Not on file  Social Connections:   . Frequency of Communication with Friends and Family: Not on file  . Frequency of Social Gatherings with Friends and Family: Not on file  . Attends Religious Services: Not on file  . Active Member of Clubs or Organizations: Not on file  . Attends Archivist Meetings: Not on file  . Marital Status: Not on file  Intimate Partner Violence:    . Fear of Current or Ex-Partner: Not on file  . Emotionally Abused: Not on file  . Physically Abused: Not on file  . Sexually Abused: Not on file    PHYSICAL EXAM: Vitals:   10/20/19 1055  BP: (!) 145/65  Pulse: (!) 50  SpO2: 99%   General: No acute distress, sitting on motorized wheelchair, in good spirits Head:  Normocephalic/atraumatic Skin/Extremities: No rash, no edema Neurological Exam: alert and oriented to person, place, and time. No aphasia or dysarthria. Fund of knowledge is appropriate.  Recent and remote memory are intact.  Attention and concentration are normal.  Cranial nerves: Pupils equal, round. Extraocular movements intact with no nystagmus. Visual fields full.  No facial asymmetry.  Motor: Bulk and tone normal, muscle strength 5/5 throughout with no pronator drift.  Finger to nose testing intact.  Gait not tested.   IMPRESSION: This is a pleasant 74 yo RH woman with a history of  hypertension, hyperlipidemia, AV block, CAD, and right temporal lobe epilepsy. No convulsions since age 26, no partial seizures since 2018. Records were reviewed, she has been on Zonisamide since 2015 with baseline GFR in the 40s, however last November GFR went down to 30. Zonisamide reduced to 300mg  qhs. Last GFR on record from January 2021 was 7. Continue to monitor with PCP. We discussed that if there is further worsening in GFR, we can try reducing Zonisamide further to 200mg  qhs, monitoring for seizure recurrence. Continue Phenobarbital 64.8mg  2 tablets daily and Zonisamide 300mg  qhs for now. If seizures recur with dose reduction we will  need to add on a different medication. She does not drive. Follow-up in 6 months, she knows to call for any changes.   Thank you for allowing me to participate in her care.  Please do not hesitate to call for any questions or concerns.   Ellouise Newer, M.D.   CC: Dr. Sharon Seller

## 2019-10-20 NOTE — Patient Instructions (Signed)
Always good to see you! Continue Phenobarbital 64.8mg : take 2 tablets every night, and Zonisamide 100mg : Take 3 capsules every night. Continue to monitor kidney function with Dr. Sharon Seller, we will let you know if medication adjustment is again needed after your visit with her. Follow-up in 6 months, call for any changes.   Seizure Precautions: 1. If medication has been prescribed for you to prevent seizures, take it exactly as directed.  Do not stop taking the medicine without talking to your doctor first, even if you have not had a seizure in a long time.   2. Avoid activities in which a seizure would cause danger to yourself or to others.  Don't operate dangerous machinery, swim alone, or climb in high or dangerous places, such as on ladders, roofs, or girders.  Do not drive unless your doctor says you may.  3. If you have any warning that you may have a seizure, lay down in a safe place where you can't hurt yourself.    4.  No driving for 6 months from last seizure, as per [facility].   Please refer to the following link on the [place] website for more information: http://www.epilepsyfoundation.org/answerplace/Social/driving/drivingu.cfm   5.  Maintain good sleep hygiene.  6.  Contact your doctor if you have any problems that may be related to the medicine you are taking.  7.  Call 911 and bring the patient back to the ED if:        A.  The seizure lasts longer than 5 minutes.       B.  The patient doesn't awaken shortly after the seizure  C.  The patient has new problems such as difficulty seeing, speaking or moving  D.  The patient was injured during the seizure  E.  The patient has a temperature over 102 F (39C)  F.  The patient vomited and now is having trouble breathing

## 2019-10-22 ENCOUNTER — Other Ambulatory Visit: Payer: Self-pay | Admitting: Internal Medicine

## 2019-10-22 NOTE — Telephone Encounter (Signed)
Next appt scheduled 9/8 with PCP.

## 2019-10-26 ENCOUNTER — Encounter: Payer: Medicare HMO | Admitting: Internal Medicine

## 2019-10-27 ENCOUNTER — Encounter: Payer: Self-pay | Admitting: Internal Medicine

## 2019-10-27 ENCOUNTER — Ambulatory Visit (INDEPENDENT_AMBULATORY_CARE_PROVIDER_SITE_OTHER): Payer: Medicare HMO | Admitting: Internal Medicine

## 2019-10-27 ENCOUNTER — Other Ambulatory Visit: Payer: Self-pay

## 2019-10-27 VITALS — BP 154/63 | HR 42 | Temp 98.2°F | Ht 61.0 in | Wt 141.1 lb

## 2019-10-27 DIAGNOSIS — L943 Sclerodactyly: Secondary | ICD-10-CM | POA: Diagnosis not present

## 2019-10-27 DIAGNOSIS — I10 Essential (primary) hypertension: Secondary | ICD-10-CM

## 2019-10-27 DIAGNOSIS — I5032 Chronic diastolic (congestive) heart failure: Secondary | ICD-10-CM

## 2019-10-27 DIAGNOSIS — Z993 Dependence on wheelchair: Secondary | ICD-10-CM | POA: Diagnosis not present

## 2019-10-27 DIAGNOSIS — Z0000 Encounter for general adult medical examination without abnormal findings: Secondary | ICD-10-CM

## 2019-10-27 DIAGNOSIS — I350 Nonrheumatic aortic (valve) stenosis: Secondary | ICD-10-CM

## 2019-10-27 DIAGNOSIS — Z23 Encounter for immunization: Secondary | ICD-10-CM | POA: Diagnosis not present

## 2019-10-27 DIAGNOSIS — Z1211 Encounter for screening for malignant neoplasm of colon: Secondary | ICD-10-CM

## 2019-10-27 DIAGNOSIS — R269 Unspecified abnormalities of gait and mobility: Secondary | ICD-10-CM

## 2019-10-27 DIAGNOSIS — R2689 Other abnormalities of gait and mobility: Secondary | ICD-10-CM

## 2019-10-27 DIAGNOSIS — I110 Hypertensive heart disease with heart failure: Secondary | ICD-10-CM | POA: Diagnosis not present

## 2019-10-27 MED ORDER — FUROSEMIDE 40 MG PO TABS: 40.0000 mg | ORAL_TABLET | Freq: Every day | ORAL | 2 refills | Status: DC

## 2019-10-27 MED ORDER — LISINOPRIL 40 MG PO TABS: 40.0000 mg | ORAL_TABLET | Freq: Every day | ORAL | 2 refills | Status: DC

## 2019-10-27 NOTE — Progress Notes (Signed)
   CC: hypertension  HPI:  [name] is a 74 y.o. with PMH as below.   Please see A&P for assessment of the patient's acute and chronic medical conditions.   Past Medical History:  Diagnosis Date  . Anemia   . Aortic stenosis   . Arthritis   . Bilateral lower extremity edema   . Bradycardia    a. 01/2013 - asymptomatic.  [name] Chronic diastolic heart failure ([place])   . CKD (chronic kidney disease)    a. baseline CKD stage III  . Coronary artery disease   . GERD (gastroesophageal reflux disease)   . Headache(784.0)   . Heart block AV second degree 02/15/2013  . Heart murmur   . Hyperlipidemia   . Hypertension   . Mobitz (type) I (Wenckebach's) atrioventricular block    a. 01/2013 - asymptomatic.  . Morbid obesity ([place])   . Seizure disorder ([place])   . Seizures ([place])   . TIA (transient ischemic attack) 2014   pt stated she had "mini strokes"   Review of Systems:   Review of Systems  Constitutional: Negative for malaise/fatigue and weight loss.  Respiratory: Negative for cough, shortness of breath and wheezing.   Cardiovascular: Positive for orthopnea. Negative for chest pain, palpitations and leg swelling.  Neurological: Positive for focal weakness and weakness. Negative for dizziness.   Physical Exam:   Constitution: NAD, appears stated age Cardio: bradycardic, V/VI systolic mumur, no LE edema  Respiratory: CTA, no w/r/r MSK: moving all extremities, 4/5 strength LE bilaterally; 4/5 strength UE bilaterally Neuro: normal affect, a&ox3 Skin: c/d/i    Vitals:   10/27/19 1314 10/27/19 1357  BP: (!) 169/68 (!) 154/63  Pulse: (!) 41 (!) 42  Temp: 98.2 F (36.8 C)   TempSrc: Oral   SpO2: 100%   Weight: 141 lb 1.6 oz (64 kg)   Height: 5\' 1"  (1.549 m)     Assessment & Plan:   See Encounters Tab for problem based charting.  Patient discussed with Dr. Dareen Piano

## 2019-10-27 NOTE — Patient Instructions (Addendum)
Thank you for allowing [place] to provide your care today. Today we discussed your blood pressure and tightening of your skin and kidney disease.     I have ordered the following labs for you:  Basic metabolic panel, autoimmune labs    I will call if any are abnormal.    Please complete the Fecal Hemoccult test and return this to the office.  Today we made the following changes to your medications:   Please follow-up in six months.    Please call the internal medicine center clinic if you have any questions or concerns, we may be able to help and keep you from a long and expensive emergency room wait. Our clinic and after hours phone number is [phone], the best time to call is Monday through Friday 9 am to 4 pm but there is always someone available 24/7 if you have an emergency. If you need medication refills please notify your pharmacy one week in advance and they will send [place] a request.

## 2019-10-29 DIAGNOSIS — R001 Bradycardia, unspecified: Secondary | ICD-10-CM | POA: Diagnosis not present

## 2019-10-29 DIAGNOSIS — L943 Sclerodactyly: Secondary | ICD-10-CM | POA: Insufficient documentation

## 2019-10-29 DIAGNOSIS — I350 Nonrheumatic aortic (valve) stenosis: Secondary | ICD-10-CM | POA: Diagnosis not present

## 2019-10-29 HISTORY — DX: Sclerodactyly: L94.3

## 2019-10-29 LAB — BMP8+ANION GAP
Anion Gap: 13 mmol/L (ref 10.0–18.0)
BUN/Creatinine Ratio: 35 — ABNORMAL HIGH (ref 12–28)
BUN: 49 mg/dL — ABNORMAL HIGH (ref 8–27)
CO2: 22 mmol/L (ref 20–29)
Calcium: 8.7 mg/dL (ref 8.7–10.3)
Chloride: 106 mmol/L (ref 96–106)
Creatinine, Ser: 1.39 mg/dL — ABNORMAL HIGH (ref 0.57–1.00)
GFR calc Af Amer: 43 mL/min/{1.73_m2} — ABNORMAL LOW (ref >59)
GFR calc non Af Amer: 38 mL/min/{1.73_m2} — ABNORMAL LOW (ref >59)
Glucose: 72 mg/dL (ref 65–99)
Potassium: 5.3 mmol/L — ABNORMAL HIGH (ref 3.5–5.2)
Sodium: 141 mmol/L (ref 134–144)

## 2019-10-29 LAB — CENTROMERE ANTIBODIES: Centromere Ab Screen: <0.2 AI (ref 0.0–0.9)

## 2019-10-29 LAB — ANTI-SCLERODERMA ANTIBODY: Scleroderma (Scl-70) (ENA) Antibody, IgG: <0.2 AI (ref 0.0–0.9)

## 2019-10-29 LAB — CK: Total CK: 106 U/L (ref 32–182)

## 2019-10-29 LAB — ANTINUCLEAR ANTIBODIES, IFA: ANA Titer 1: NEGATIVE

## 2019-10-29 NOTE — Assessment & Plan Note (Signed)
Stage D3 aortic stenosis. Continues to be asymptomatic, no dizziness on standing. Recently seen by cardiology 8/23 and patient prefers continued monitoring. She will follow-up with cardiology in one year.

## 2019-10-29 NOTE — Assessment & Plan Note (Signed)
Fit test ordered today

## 2019-10-29 NOTE — Assessment & Plan Note (Addendum)
History of conduction disease and previous cardiac MRI positive for amyloidosis although transthyretin negative. She also has CKD. 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 No calcifications, dysphagia or telangectasias.   - ANA, topoisomerase, CK, and anticentromere  ADDENDUM: labs negative for sclerosis and ANA negative

## 2019-10-29 NOTE — Assessment & Plan Note (Signed)
Patient seen today. Chronic lower extremity weakness that has required a motorized wheelchair for more than 13 years, additionally has some upper extremity weakness as well. She requires a hospital bed due to difficulty with positioning and pain in addition to chronic severe heart failure requiring elevation more than 30 degrees.   - DME order for hospital bed

## 2019-10-29 NOTE — Assessment & Plan Note (Signed)
BP Readings from Last 3 Encounters:  10/27/19 (!) 154/63  10/20/19 (!) 145/65  10/11/19 (!) 124/58   Blood pressure slightly elevated. She also checks this at home, ranges from 115 and does not go above 160. She did not remember to bring her blood pressure cuff today. Recent bp with cardiology follow-up 124/58. With her severe aortic stenosis and conduction disorder will continue to monitor as she is having normal pressures as well. Previously discussed with her cardiologist and can consider chlorthalidone if consistently elevated.  - f/u six months - bmp today  - cont lisinopril  40 mg qd, norvasc 10 mg qd, lasix 40 mg qd

## 2019-11-01 DIAGNOSIS — Z1211 Encounter for screening for malignant neoplasm of colon: Secondary | ICD-10-CM | POA: Diagnosis not present

## 2019-11-02 ENCOUNTER — Other Ambulatory Visit: Payer: Medicare HMO

## 2019-11-02 ENCOUNTER — Telehealth: Payer: Self-pay | Admitting: *Deleted

## 2019-11-02 ENCOUNTER — Other Ambulatory Visit: Payer: Self-pay

## 2019-11-02 DIAGNOSIS — Z1211 Encounter for screening for malignant neoplasm of colon: Secondary | ICD-10-CM

## 2019-11-02 NOTE — Telephone Encounter (Signed)
CM sent to Skeet Latch at [facility] for [facility] bed. F2F was 10/27/2019. Hubbard Hartshorn, BSN, RN-BC

## 2019-11-02 NOTE — Addendum Note (Signed)
Addended by: Molli Hazard A on: 11/02/2019 09:07 AM   Modules accepted: Orders

## 2019-11-02 NOTE — Telephone Encounter (Signed)
[name], Sherry  Susanne Baumgarner L, RN; [name], Sherry; Stenson, Melissa; Gilliam, Pamela; New, Bradley; 1 other ° ° °received, thanks  ° °

## 2019-11-04 LAB — FECAL OCCULT BLOOD, IMMUNOCHEMICAL: Fecal Occult Bld: NEGATIVE

## 2019-11-04 NOTE — Progress Notes (Signed)
Internal Medicine Clinic Attending  Case discussed with Dr. Seawell  At the time of the visit.  We reviewed the resident's history and exam and pertinent patient test results.  I agree with the assessment, diagnosis, and plan of care documented in the resident's note.  

## 2019-11-05 ENCOUNTER — Encounter: Payer: Self-pay | Admitting: Internal Medicine

## 2019-11-05 DIAGNOSIS — I5032 Chronic diastolic (congestive) heart failure: Secondary | ICD-10-CM | POA: Diagnosis not present

## 2019-12-05 DIAGNOSIS — I5032 Chronic diastolic (congestive) heart failure: Secondary | ICD-10-CM | POA: Diagnosis not present

## 2020-01-05 DIAGNOSIS — I5032 Chronic diastolic (congestive) heart failure: Secondary | ICD-10-CM | POA: Diagnosis not present

## 2020-01-10 ENCOUNTER — Telehealth: Payer: Self-pay

## 2020-01-10 NOTE — Telephone Encounter (Signed)
Patient called about her bed. I called the patient back and she said the mattress was hard and it was hurting her back,she said she has had the bed for about 2 months. I will send a community message to [employer] to see if they can help [place] out [place], [place] C11/22/20214:15 PM

## 2020-01-10 NOTE — Telephone Encounter (Signed)
Requesting to speak with a nurse about getting a bigger bed. Please call back.

## 2020-01-11 NOTE — Telephone Encounter (Signed)
RE: hospital bed Received: Sandford Craze, Baxter Kail, Trish F[place], [place]; Skeet Latch; [place], Charlotte Sanes; 1 other   Hello,   Unfortunately the only additional possibility would be some type of overlay. She just received the bed in September and her weight only qualifies her for the bed she has.   Thanks       Previous Messages   ----- Message -----  From: Judge Stall, [place]  Sent: 01/10/2020  4:15 PM EST  To: Darlina Guys, Kem Kays, *  Subject: hospital bed                   Good afternoon: patient has called today to say her bed is hurting her back,it is too small and the mattress is not comfortable.Is there anything that can be done [place], [place] C11/22/20214:20 PM

## 2020-01-15 ENCOUNTER — Telehealth: Payer: Self-pay | Admitting: Internal Medicine

## 2020-01-15 NOTE — Telephone Encounter (Addendum)
Internal [facility] 24h Emergency Line Patient Call Note:  75MOL with diastolic heart failure (M7EM) and severe LVH. Pt noting increase bilateral ankle swelling over the past few days. She denies orthopnea or dyspnea. She reports good compliance with 40mg  lasix daily. Denies any dietary indiscretions. Weight is up 2lb from last week.  Plan: Instructed her to take 40mg  lasix twice daily until Monday and to call the clinic to arrange appointment to be evaluated. Will have her follow up in the clinic next week at the next available appointment for re-evaluation and to recheck a BMP.  Mitzi Hansen, MD Internal Medicine Resident PGY-2 [facility] Pager: [phone] 01/15/2020 2:00 PM

## 2020-01-19 ENCOUNTER — Ambulatory Visit (HOSPITAL_COMMUNITY)
Admission: RE | Admit: 2020-01-19 | Discharge: 2020-01-19 | Disposition: A | Discharge status: Home / Self Care | Payer: Medicare HMO | Source: Ambulatory Visit | Attending: Internal Medicine | Admitting: Internal Medicine

## 2020-01-19 ENCOUNTER — Observation Stay: Admission: AD | Admit: 2020-01-19 | Payer: Medicare HMO | Source: Home / Self Care | Admitting: Internal Medicine

## 2020-01-19 ENCOUNTER — Observation Stay (HOSPITAL_BASED_OUTPATIENT_CLINIC_OR_DEPARTMENT_OTHER)
Admission: AD | Admit: 2020-01-19 | Discharge: 2020-01-19 | Disposition: A | Discharge status: Home / Self Care | Payer: Medicare HMO | Source: Home / Self Care

## 2020-01-19 ENCOUNTER — Ambulatory Visit (INDEPENDENT_AMBULATORY_CARE_PROVIDER_SITE_OTHER): Payer: Medicare HMO | Admitting: Internal Medicine

## 2020-01-19 ENCOUNTER — Encounter: Payer: Self-pay | Admitting: Internal Medicine

## 2020-01-19 ENCOUNTER — Other Ambulatory Visit: Payer: Self-pay

## 2020-01-19 ENCOUNTER — Observation Stay (HOSPITAL_COMMUNITY)
Admission: AD | Admit: 2020-01-19 | Discharge: 2020-01-20 | Disposition: A | Discharge status: Home / Self Care | Payer: Medicare HMO | Attending: Emergency Medicine | Admitting: Emergency Medicine

## 2020-01-19 VITALS — BP 147/60 | HR 74 | Wt 137.5 lb

## 2020-01-19 DIAGNOSIS — I440 Atrioventricular block, first degree: Secondary | ICD-10-CM | POA: Diagnosis not present

## 2020-01-19 DIAGNOSIS — R269 Unspecified abnormalities of gait and mobility: Secondary | ICD-10-CM | POA: Diagnosis not present

## 2020-01-19 DIAGNOSIS — R600 Localized edema: Secondary | ICD-10-CM | POA: Insufficient documentation

## 2020-01-19 DIAGNOSIS — R609 Edema, unspecified: Secondary | ICD-10-CM

## 2020-01-19 DIAGNOSIS — M7989 Other specified soft tissue disorders: Secondary | ICD-10-CM

## 2020-01-19 DIAGNOSIS — I350 Nonrheumatic aortic (valve) stenosis: Secondary | ICD-10-CM | POA: Diagnosis not present

## 2020-01-19 DIAGNOSIS — I130 Hypertensive heart and chronic kidney disease with heart failure and stage 1 through stage 4 chronic kidney disease, or unspecified chronic kidney disease: Secondary | ICD-10-CM | POA: Insufficient documentation

## 2020-01-19 DIAGNOSIS — M79604 Pain in right leg: Secondary | ICD-10-CM

## 2020-01-19 DIAGNOSIS — N183 Chronic kidney disease, stage 3 unspecified: Secondary | ICD-10-CM | POA: Diagnosis not present

## 2020-01-19 DIAGNOSIS — M79605 Pain in left leg: Secondary | ICD-10-CM

## 2020-01-19 DIAGNOSIS — Z79899 Other long term (current) drug therapy: Secondary | ICD-10-CM | POA: Diagnosis not present

## 2020-01-19 DIAGNOSIS — Z7982 Long term (current) use of aspirin: Secondary | ICD-10-CM | POA: Diagnosis not present

## 2020-01-19 DIAGNOSIS — Z87891 Personal history of nicotine dependence: Secondary | ICD-10-CM | POA: Diagnosis not present

## 2020-01-19 DIAGNOSIS — I5032 Chronic diastolic (congestive) heart failure: Secondary | ICD-10-CM | POA: Insufficient documentation

## 2020-01-19 LAB — COMPREHENSIVE METABOLIC PANEL
ALT: 23 U/L (ref 0–44)
AST: 38 U/L (ref 15–41)
Albumin: 3.1 g/dL — ABNORMAL LOW (ref 3.5–5.0)
Alkaline Phosphatase: 57 U/L (ref 38–126)
Anion gap: 13 (ref 5–15)
BUN: 51 mg/dL — ABNORMAL HIGH (ref 8–23)
CO2: 21 mmol/L — ABNORMAL LOW (ref 22–32)
Calcium: 8.7 mg/dL — ABNORMAL LOW (ref 8.9–10.3)
Chloride: 109 mmol/L (ref 98–111)
Creatinine, Ser: 1.73 mg/dL — ABNORMAL HIGH (ref 0.44–1.00)
GFR, Estimated: 31 mL/min — ABNORMAL LOW (ref >60)
Glucose, Bld: 88 mg/dL (ref 70–99)
Potassium: 4.3 mmol/L (ref 3.5–5.1)
Sodium: 143 mmol/L (ref 135–145)
Total Bilirubin: 0.4 mg/dL (ref 0.3–1.2)
Total Protein: 7.5 g/dL (ref 6.5–8.1)

## 2020-01-19 LAB — BRAIN NATRIURETIC PEPTIDE: B Natriuretic Peptide: 268.8 pg/mL — ABNORMAL HIGH (ref 0.0–100.0)

## 2020-01-19 LAB — MAGNESIUM: Magnesium: 2.7 mg/dL — ABNORMAL HIGH (ref 1.7–2.4)

## 2020-01-19 NOTE — ED Triage Notes (Signed)
Pt sent here from IM clinic for eval of possible DVT in bilateral legs. Swelling in feet x 1 week. Compliant with Lasix. Denies shob.

## 2020-01-19 NOTE — Progress Notes (Signed)
dvt b  CC: lower extremity pain and swelling  HPI:  [name] is a 74 y.o. with PMH as below.   Please see A&P for assessment of the patient's acute and chronic medical conditions.   Left leg swelling and severe pain for one week, about two days ago she also began having severe pain and swelling in the right leg. Her daughter and husband also endorse bilateral swelling as not initially present. She has been unable to walk or stand due to pain. She denies shortness of breath, chest pain, fever, chills, cough. She has been taking her medications daily. High risk for HF exacerbation with severe aortic stenosis, conduction disease and chronic diastolic heart failure.    Past Medical History:  Diagnosis Date  . Anemia   . Aortic stenosis   . Arthritis   . Bilateral lower extremity edema   . Bradycardia    a. 01/2013 - asymptomatic.  [name] Chronic diastolic heart failure ([place])   . CKD (chronic kidney disease)    a. baseline CKD stage III  . Coronary artery disease   . GERD (gastroesophageal reflux disease)   . Headache(784.0)   . Heart block AV second degree 02/15/2013  . Heart murmur   . Hyperlipidemia   . Hypertension   . Mobitz (type) I (Wenckebach's) atrioventricular block    a. 01/2013 - asymptomatic.  . Morbid obesity ([place])   . Seizure disorder ([place])   . Seizures ([place])   . TIA (transient ischemic attack) 2014   pt stated she had "mini strokes"   Review of Systems:   10 point ROS negative except as noted in HPI  Physical Exam:   Constitution: NAD, appears stated age HENT: Eyes: no icterus or injection  Cardio: RRR, V/VI systolic murmur, +3 pitting edema to knees, no JVD but sitting upright Respiratory: CTA, no w/r/r Abdominal: NTTP, soft, non-distended MSK: moving all extremities, very TTP bilateral legs and feet Neuro: normal affect, a&ox3 Skin: c/d/i    Vitals:   01/19/20 1454  BP: (!) 147/60  Pulse: 74  SpO2: 100%    Assessment & Plan:   See  Encounters Tab for problem based charting.  Patient discussed with Dr. Dareen Piano

## 2020-01-19 NOTE — Assessment & Plan Note (Signed)
Left leg swelling and severe pain for one week, about two days ago she also began having severe pain and swelling in the right leg. Her daughter and husband also endorse bilateral swelling as not initially present. She has been unable to walk or stand due to pain. She denies shortness of breath, chest pain, fever, chills, cough. She has been taking her medications daily. High risk for HF exacerbation with severe aortic stenosis, conduction disease and chronic diastolic heart failure.  Weight is decreased from prior, no JVD on exam but difficult to evaluate due to patient sitting upright in wheelchair. Edema up to the knees bilaterally. Lungs clear  - ECG shows no acute changes  - Likely HF exacerbation but with severity of pain and family endorsing initially unilateral swelling, would like to rule out DVT - unable to obtain today so will send to the ER as she is also having extreme pain - BNP, CMP

## 2020-01-19 NOTE — ED Provider Notes (Signed)
MSE was initiated and I personally evaluated the patient and placed orders (if any) at  6:04 PM on January 19, 2020.  Called to triage to assess patient for concern for bilateral DVTs.  I have low suspicion for this.  She has history of CHF and has had bilateral leg swelling that is symmetric with no calf tenderness and swelling restricted only to the feet this is been present for 1 week.  She is on Lasix she states she takes 1 tablet by mouth daily.  She states she has been taking this.  She has no history of VTE, she denies any recent immobilization, surgery, no exogenous estrogen use, she denies any chest pain or hemoptysis, states that she has no history of cancer.  CONSTITUTIONAL:  well-appearing, NAD NEURO:  Alert and oriented x 3, no focal deficits EYES:  pupils equal and reactive ENT/NECK:  trachea midline, no JVD CARDIO:  reg rate, reg rhythm, well-perfused PULM:  None labored breathing GI/GU:  Abdomen non-distended MSK/SPINE:  No gross deformities, bilateral lower extremities with swelling that is symmetric to bilateral feet.  Scant pitting edema. SKIN:  no rash obvious, atraumatic, no ecchymosis  PSYCH:  Appropriate speech and behavior  We will obtain bilateral lower extremity ultrasounds given clinical concern for DVT expressed by patient's PCP.  Very low suspicion for this however.  The patient appears stable so that the remainder of the MSE may be completed by another provider.   Tedd Sias, [place] 01/19/20 1806    Lennice Sites, DO 01/19/20 2326

## 2020-01-19 NOTE — Progress Notes (Signed)
Bilateral lower extremity venous duplex has been completed. Preliminary results can be found in CV Proc through chart review.  Results were given to [facility] PA.  01/19/20 6:36 PM Carlos Levering RVT

## 2020-01-20 ENCOUNTER — Encounter: Payer: Medicare HMO | Admitting: Internal Medicine

## 2020-01-20 ENCOUNTER — Other Ambulatory Visit: Payer: Self-pay | Admitting: Internal Medicine

## 2020-01-20 DIAGNOSIS — M810 Age-related osteoporosis without current pathological fracture: Secondary | ICD-10-CM

## 2020-01-20 MED ORDER — ACETAMINOPHEN 500 MG PO TABS
1000.0000 mg | ORAL_TABLET | Freq: Once | ORAL | Status: AC
  Administered 2020-01-20: 1000 mg via ORAL
  Filled 2020-01-20: qty 2

## 2020-01-20 NOTE — Discharge Instructions (Signed)
Increase your lasix to 40 mg in the morning and 20 mg at night for 1 week. Follow up with your PCP to recheck your kidney function in 5-7 days.

## 2020-01-20 NOTE — ED Provider Notes (Signed)
[facility] EMERGENCY DEPARTMENT Provider Note  CSN: [id number] Arrival date & time: 01/19/20 1713  Chief Complaint(s) No chief complaint on file.  HPI Sherry [name] is a 74 y.o. female who presents to the emergency department with 1 week of gradually worsening bilateral lower extremity feet swelling with associated pain that is aching/throbbing in nature.  It worse with palpation and ambulation.  No alleviating factor.  Patient has a history of chronic diastolic heart failure and CKD on Lasix, 40 mg daily.  Denies any chest pain or shortness of breath.  Patient seen by her PCP and recommended evaluation to rule out DVT.  She was seen in the first look process and ultrasound ordered.  Ultrasound is negative for DVT on either leg.   HPI  Past Medical History Past Medical History:  Diagnosis Date  . Anemia   . Aortic stenosis   . Arthritis   . Bilateral lower extremity edema   . Bradycardia    a. 01/2013 - asymptomatic.  [name] Chronic diastolic heart failure ([place])   . CKD (chronic kidney disease)    a. baseline CKD stage III  . Coronary artery disease   . GERD (gastroesophageal reflux disease)   . Headache(784.0)   . Heart block AV second degree 02/15/2013  . Heart murmur   . Hyperlipidemia   . Hypertension   . Mobitz (type) I (Wenckebach's) atrioventricular block    a. 01/2013 - asymptomatic.  . Morbid obesity ([place])   . Seizure disorder ([place])   . Seizures ([place])   . TIA (transient ischemic attack) 2014   pt stated she had "mini strokes"   Patient Active Problem List   Diagnosis Date Noted  . 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 Seizures ([place])   . Hyperlipidemia   . GERD (gastroesophageal reflux disease)   . Bilateral lower extremity edema   . Arthritis   . Blurry vision, bilateral 05/08/2016  . Thoracic aortic atherosclerosis ([place]) 05/08/2016  . Osteoporosis 10/11/2015  . Vitamin D deficiency 04/22/2014  . Health care maintenance 04/22/2014  . Coronary  artery disease   . 1st degree AV block 03/05/2013  . Aortic stenosis 02/18/2013  . Chronic diastolic congestive heart failure ([place]) 11/11/2012  . Bradycardia 07/02/2012  . Multiple lacunar infarcts ([place]) 05/07/2012  . Late effects of cerebrovascular disease 01/23/2012  . Internal and external hemorrhoids without complication 01/75/1025  . Degenerative cervical disc 01/20/2012  . Intractable focal epilepsy with impairment of consciousness ([place]) 01/02/2012  . CKD (chronic kidney disease) stage 3, GFR 30-59 ml/min (HCC) 04/28/2011  . Abnormality of gait- basically wheelchair bound 07/31/2009  . Essential hypertension 06/04/2006  . Osteoarthritis of lumbar spine 06/04/2006  . Epilepsy ([place]) 06/04/2006   Home Medication(s) Prior to Admission medications   Medication Sig Start Date End Date Taking? Authorizing Provider  alendronate (FOSAMAX) 70 MG tablet Take 0.5 tablets (35 mg total) by mouth once a week. 05/19/19   Seawell, Jaimie A, DO  amLODipine (NORVASC) 10 MG tablet TAKE ONE TABLET BY MOUTH DAILY 09/07/19   Marianna Payment, MD  aspirin 81 MG EC tablet Take 81 mg by mouth daily.     [provider]  Calcium Carb-Cholecalciferol (CALCIUM 500 +D) 500-400 MG-UNIT TABS Take 1 tablet by mouth 2 (two) times daily. 10/25/15   Mignon Pine, DO  furosemide (LASIX) 40 MG tablet Take 1 tablet (40 mg total) by mouth daily. 10/27/19   Seawell, Jaimie A, DO  lisinopril (ZESTRIL) 40 MG tablet Take 1 tablet (40  mg total) by mouth daily. 10/27/19   Seawell, Jaimie A, DO  PHENobarbital (LUMINAL) 64.8 MG tablet Take 2 tablets (129.6 mg total) by mouth daily. 10/20/19   Cameron Sprang, MD  pravastatin (PRAVACHOL) 40 MG tablet TAKE ONE TABLET BY MOUTH AT BEDTIME 10/22/19   Cato Mulligan, MD  zonisamide [facility]) 100 MG capsule Take 3 capsules every night 10/20/19   Cameron Sprang, MD                                                                                                                                     Past Surgical History Past Surgical History:  Procedure Laterality Date  . ABDOMINAL HYSTERECTOMY    . LEFT AND RIGHT HEART CATHETERIZATION WITH CORONARY ANGIOGRAM N/A 05/24/2013   Procedure: LEFT AND RIGHT HEART CATHETERIZATION WITH CORONARY ANGIOGRAM;  Surgeon: Sinclair Grooms, MD;  Location[facility] CATH LAB;  Service: Cardiovascular;  Laterality: N/A;   Family History Family History  Problem Relation Age of Onset  . Hypertension Other     Social History Social History   Tobacco Use  . Smoking status: Former Smoker    Types: Cigarettes    Quit date: 06/03/2000    Years since quitting: 19.6  . Smokeless tobacco: Never Used  Vaping Use  . Vaping Use: Never used  Substance Use Topics  . Alcohol use: No    Alcohol/week: 0.0 standard drinks  . Drug use: No   Allergies Ibuprofen  Review of Systems Review of Systems All other systems are reviewed and are negative for acute change except as noted in the HPI  Physical Exam Vital Signs  I have reviewed the triage vital signs BP 135/66 (BP Location: Right Arm)   Pulse (!) 58   Temp 98.2 F (36.8 C) (Oral)   Resp 16   SpO2 98%   Physical Exam Vitals reviewed.  Constitutional:      General: She is not in acute distress.    Appearance: She is well-developed. She is not diaphoretic.  HENT:     Head: Normocephalic and atraumatic.     Right Ear: External ear normal.     Left Ear: External ear normal.     Nose: Nose normal.  Eyes:     General: No scleral icterus.    Conjunctiva/sclera: Conjunctivae normal.  Neck:     Trachea: Phonation normal.  Cardiovascular:     Rate and Rhythm: Normal rate and regular rhythm.     Pulses:          Dorsalis pedis pulses are 1+ on the right side and 1+ on the left side.       Posterior tibial pulses are 1+ on the right side and 1+ on the left side.  Pulmonary:     Effort: Pulmonary effort is normal. No respiratory distress.     Breath sounds: No stridor.  Abdominal:  General: There is no distension.  Musculoskeletal:        General: Normal range of motion.     Cervical back: Normal range of motion.     Right lower leg: 1+ Pitting Edema present.     Left lower leg: 1+ Pitting Edema present.  Neurological:     Mental Status: She is alert and oriented to person, place, and time.  Psychiatric:        Behavior: Behavior normal.     ED Results and Treatments Labs (all labs ordered are listed, but only abnormal results are displayed) Labs Reviewed  CBC  COMPREHENSIVE METABOLIC PANEL                                                                                                                         EKG  EKG Interpretation  Date/Time:    Ventricular Rate:    PR Interval:    QRS Duration:   QT Interval:    QTC Calculation:   R Axis:     Text Interpretation:        Radiology VAS [place] LOWER EXTREMITY VENOUS (DVT) (ONLY [facility] & [facility])  Result Date: 01/19/2020  Lower Venous DVT Study Indications: Swelling.  Risk Factors: None identified. Limitations: Poor ultrasound/tissue interface and patient positioning, patient pain tolerance, patient immobility. Comparison Study: No prior studies. Performing Technologist: Oliver Hum RVT  Examination Guidelines: A complete evaluation includes B-mode imaging, spectral Doppler, color Doppler, and power Doppler as needed of all accessible portions of each vessel. Bilateral testing is considered an integral part of a complete examination. Limited examinations for reoccurring indications may be performed as noted. The reflux portion of the exam is performed with the patient in reverse Trendelenburg.  +---------+---------------+---------+-----------+----------+--------------+ RIGHT    CompressibilityPhasicitySpontaneityPropertiesThrombus Aging +---------+---------------+---------+-----------+----------+--------------+ CFV      Full           Yes      Yes                                  +---------+---------------+---------+-----------+----------+--------------+ SFJ      Full                                                        +---------+---------------+---------+-----------+----------+--------------+ FV Prox  Full                                                        +---------+---------------+---------+-----------+----------+--------------+ FV Mid   Full                                                        +---------+---------------+---------+-----------+----------+--------------+  FV DistalFull           Yes      Yes                                 +---------+---------------+---------+-----------+----------+--------------+ PFV      Full                                                        +---------+---------------+---------+-----------+----------+--------------+ POP      Full           Yes      Yes                                 +---------+---------------+---------+-----------+----------+--------------+ PTV      Full                                                        +---------+---------------+---------+-----------+----------+--------------+ PERO     Full                                                        +---------+---------------+---------+-----------+----------+--------------+   +---------+---------------+---------+-----------+----------+--------------+ LEFT     CompressibilityPhasicitySpontaneityPropertiesThrombus Aging +---------+---------------+---------+-----------+----------+--------------+ CFV      Full           Yes      Yes                                 +---------+---------------+---------+-----------+----------+--------------+ SFJ      Full                                                        +---------+---------------+---------+-----------+----------+--------------+ FV Prox  Full                                                         +---------+---------------+---------+-----------+----------+--------------+ FV Mid   Full                                                        +---------+---------------+---------+-----------+----------+--------------+ FV DistalFull                                                        +---------+---------------+---------+-----------+----------+--------------+  PFV      Full                                                        +---------+---------------+---------+-----------+----------+--------------+ POP      Full           Yes      Yes                                 +---------+---------------+---------+-----------+----------+--------------+ PTV      Full                                                        +---------+---------------+---------+-----------+----------+--------------+ PERO     Full                                                        +---------+---------------+---------+-----------+----------+--------------+     Summary: RIGHT: - There is no evidence of deep vein thrombosis in the lower extremity. However, portions of this examination were limited- see technologist comments above.  - No cystic structure found in the popliteal fossa.  LEFT: - There is no evidence of deep vein thrombosis in the lower extremity. However, portions of this examination were limited- see technologist comments above.  - No cystic structure found in the popliteal fossa.  *See table(s) above for measurements and observations. Electronically signed by Harold Barban MD on 01/19/2020 at 8:50:56 PM.    Final     Pertinent labs & imaging results that were available during my care of the patient were reviewed by me and considered in my medical decision making (see chart for details).  Medications Ordered in ED Medications - No data to display                                                                                                                                   Procedures Procedures  (including critical care time)  Medical Decision Making / ED Course I have reviewed the nursing notes for this encounter and the patient's prior records (if available in EHR or on provided paperwork).   Sherry [name] was evaluated in Emergency Department on 01/20/2020 for the symptoms described in the history of present illness. She was evaluated in the context of the global COVID-19 pandemic, which necessitated consideration that the patient might be at risk for infection with the  SARS-CoV-2 virus that causes COVID-19. Institutional protocols and algorithms that pertain to the evaluation of patients at risk for COVID-19 are in a state of rapid change based on information released by regulatory bodies including the CDC and federal and state organizations. These policies and algorithms were followed during the patient's care in the ED.  Patient presents with bilateral lower extremity pain related to peripheral edema.  DVT ultrasound performed and negative.  Basic labs drawn earlier by PCP noted mild worsening of her renal sufficiency.  Will have patient increase her Lasix dose from 40 mg to 60 mg daily for 1 week.  Recommended she follow-up with her primary care provider in 5 to 7 days for repeat renal panel.  Also recommended compression stockings/wraps and elevating extremity.      Final Clinical Impression(s) / ED Diagnoses Final diagnoses:  Peripheral edema    The patient appears reasonably screened and/or stabilized for discharge and I doubt any other medical condition or other [facility] requiring further screening, evaluation, or treatment in the ED at this time prior to discharge. Safe for discharge with strict return precautions.  Disposition: Discharge  Condition: Good  I have discussed the results, Dx and Tx plan with the patient/family who expressed understanding and agree(s) with the plan. Discharge instructions discussed at length. The patient/family was  given strict return precautions who verbalized understanding of the instructions. No further questions at time of discharge.    ED Discharge Orders    None       Follow Up: Primary care provider  Call  To schedule an appointment for close follow up, in 5-7 days     This chart was dictated using voice recognition software.  Despite best efforts to proofread,  errors can occur which can change the documentation meaning.   Fatima Blank, MD 01/20/20 [phone]

## 2020-01-24 NOTE — Addendum Note (Signed)
Addended by: Molli Hazard A on: 01/24/2020 09:15 AM   Modules accepted: Orders

## 2020-01-24 NOTE — Progress Notes (Signed)
Internal Medicine Clinic Attending  Case discussed with Dr. Seawell  At the time of the visit.  We reviewed the resident's history and exam and pertinent patient test results.  I agree with the assessment, diagnosis, and plan of care documented in the resident's note.  

## 2020-01-24 NOTE — Assessment & Plan Note (Signed)
Patient presenting today with acute lower extremity edema that is completely limiting her ability to stand, likely secondary to chronic heart failure and aortic stenosis. She also has chronic lower extremity weakness and is wheelchair bound. She is at increased risk for ulcers from limited mobility and would benefit from gel overlay on her bed.

## 2020-01-25 NOTE — Telephone Encounter (Signed)
CM sent to Skeet Latch at [facility] for gel overlay mattress. F2F was 01/19/2020. Hubbard Hartshorn, BSN, RN-BC

## 2020-01-25 NOTE — Telephone Encounter (Signed)
Theophilus Bones, RN; Sandi Raveling, [place]; [place], Leory Plowman; [place], [place]; 1 other   got it, thanks!

## 2020-01-26 NOTE — Progress Notes (Signed)
   CC: lower extremity pain and swelling  HPI:  [name] is a 74 y.o. with PMH as below.   Please see A&P for assessment of the patient's acute and chronic medical conditions.   Past Medical History:  Diagnosis Date  . Anemia   . Aortic stenosis   . Arthritis   . Bilateral lower extremity edema   . Bradycardia    a. 01/2013 - asymptomatic.  [name] Chronic diastolic heart failure ([place])   . CKD (chronic kidney disease)    a. baseline CKD stage III  . Coronary artery disease   . GERD (gastroesophageal reflux disease)   . Headache(784.0)   . Heart block AV second degree 02/15/2013  . Heart murmur   . Hyperlipidemia   . Hypertension   . Mobitz (type) I (Wenckebach's) atrioventricular block    a. 01/2013 - asymptomatic.  . Morbid obesity ([place])   . Seizure disorder ([place])   . Seizures ([place])   . TIA (transient ischemic attack) 2014   pt stated she had "mini strokes"   Review of Systems:   Review of Systems  Respiratory: Negative for cough, shortness of breath and wheezing.   Cardiovascular: Positive for leg swelling. Negative for chest pain, palpitations and claudication.  Gastrointestinal: Negative for abdominal pain, nausea and vomiting.  Genitourinary: Negative for dysuria, frequency and urgency.  Neurological: Negative for dizziness, tingling, focal weakness and headaches.   Physical Exam: Constitution: NAD, appears stated age 52: RRR, V/VI systolic murmur, no JVD, pedal pulses intact, pitting edema to mid shin Respiratory: clear to auscultation bilaterally, non-labored breathing MSK: moving all extremities; left lower extremity very tender with any movement or palpation, RL extremity no pain with movement, mild tenderness with palpation; no pain above the ankles bilaterally Neuro: normal affect, a&ox3 Skin: c/d/i    Vitals:   01/27/20 0943  BP: (!) 154/70  Pulse: 76  Temp: 98.5 F (36.9 C)  TempSrc: Oral  SpO2: 97%    Assessment & Plan:   See  Encounters Tab for problem based charting.  Patient discussed with Dr. Evette Doffing

## 2020-01-27 ENCOUNTER — Ambulatory Visit (INDEPENDENT_AMBULATORY_CARE_PROVIDER_SITE_OTHER): Payer: Medicare HMO | Admitting: Internal Medicine

## 2020-01-27 ENCOUNTER — Other Ambulatory Visit: Payer: Self-pay | Admitting: Internal Medicine

## 2020-01-27 VITALS — BP 154/70 | HR 76 | Temp 98.5°F

## 2020-01-27 DIAGNOSIS — I5033 Acute on chronic diastolic (congestive) heart failure: Secondary | ICD-10-CM | POA: Diagnosis not present

## 2020-01-27 DIAGNOSIS — M79604 Pain in right leg: Secondary | ICD-10-CM | POA: Diagnosis not present

## 2020-01-27 DIAGNOSIS — R600 Localized edema: Secondary | ICD-10-CM | POA: Diagnosis not present

## 2020-01-27 DIAGNOSIS — M79671 Pain in right foot: Secondary | ICD-10-CM | POA: Diagnosis not present

## 2020-01-27 DIAGNOSIS — E782 Mixed hyperlipidemia: Secondary | ICD-10-CM

## 2020-01-27 DIAGNOSIS — M79672 Pain in left foot: Secondary | ICD-10-CM

## 2020-01-27 DIAGNOSIS — M79605 Pain in left leg: Secondary | ICD-10-CM | POA: Diagnosis not present

## 2020-01-27 DIAGNOSIS — M109 Gout, unspecified: Secondary | ICD-10-CM

## 2020-01-27 LAB — BASIC METABOLIC PANEL
Anion gap: 12 (ref 5–15)
BUN: 39 mg/dL — ABNORMAL HIGH (ref 8–23)
CO2: 22 mmol/L (ref 22–32)
Calcium: 8.6 mg/dL — ABNORMAL LOW (ref 8.9–10.3)
Chloride: 102 mmol/L (ref 98–111)
Creatinine, Ser: 1.51 mg/dL — ABNORMAL HIGH (ref 0.44–1.00)
GFR, Estimated: 36 mL/min — ABNORMAL LOW (ref >60)
Glucose, Bld: 97 mg/dL (ref 70–99)
Potassium: 5.1 mmol/L (ref 3.5–5.1)
Sodium: 136 mmol/L (ref 135–145)

## 2020-01-27 LAB — LIPID PANEL
Cholesterol: 189 mg/dL (ref 0–200)
HDL: 46 mg/dL (ref >40)
LDL Cholesterol: 122 mg/dL — ABNORMAL HIGH (ref 0–99)
Total CHOL/HDL Ratio: 4.1 RATIO
Triglycerides: 104 mg/dL (ref <150)
VLDL: 21 mg/dL (ref 0–40)

## 2020-01-27 LAB — URIC ACID: Uric Acid, Serum: 8 mg/dL — ABNORMAL HIGH (ref 2.5–7.1)

## 2020-01-27 LAB — MAGNESIUM: Magnesium: 2.7 mg/dL — ABNORMAL HIGH (ref 1.7–2.4)

## 2020-01-27 MED ORDER — PREDNISONE 20 MG PO TABS: 40.0000 mg | ORAL_TABLET | Freq: Every day | ORAL | 0 refills | Status: DC

## 2020-01-27 NOTE — Patient Instructions (Signed)
Thank you for allowing [place] to provide your care today. Today we discussed your lower extremity pain and swelling  I have ordered the following labs for you:  Basic metabolic panel, uric acid, magnesium, lipid panel    I will call if any are abnormal.    Please wear compression stockings or wraps 12 hours throughout the day and continue to elevate your legs. This will continue to help your swelling  Today we made the following changes to your medications:   Please continue taking lasix 60 mg once per day   Please follow-up in one week.    Please call the internal medicine center clinic if you have any questions or concerns, we may be able to help and keep you from a long and expensive emergency room wait. Our clinic and after hours phone number is [phone], the best time to call is Monday through Friday 9 am to 4 pm but there is always someone available 24/7 if you have an emergency. If you need medication refills please notify your pharmacy one week in advance and they will send [place] a request.

## 2020-01-27 NOTE — Assessment & Plan Note (Addendum)
Follow-up from one week ago with very painful bilateral LE swelling that started unilateral on the left, sent to the ER to rule out DVT, which was negative. No SOB or JVP at the time. BNP 268, lasix was increased from 40 mg qd to 60 mg qd. She has HFpEF, severe aortic stenosis, and conduction delay, which limits her diuresis. She follows with Dr. Burt Knack. She returns today after taking the lasix 60 mg qd for 7 days, reduced back to her normal dose.  She continues to have swelling and pain in the lower extremities although this has slightly improved. The pain in the left foot has significantly improved. The right side continues to hurt. Last night her right foot hurt so bad she couldn't sleep and anything touching it was extremely painful. She has been trying to elevate her legs and use compression stockings although does not wear them throughout the day. The pain is not worse with elevation. No numbness or tingling.  The pain in her feet has been out of proportion to what I would expect for her degree of edema as she has tenderness with very light touch to her left food. The swelling does appear slightly improved compared to prior visit. Lungs are clear, no JVD.   - bmp, mg - continue lasix 60 mg qd, f/u in one week - can do telehealth if symptoms continue to improve - discussed importance of wearing compression stockings throughout the day - uric acid   ADDENDUM: uric acid elevated. This may be in the context of her CKD, but with the intensity, onset and description of her pain, may be an initial episode of gout. Will prescribe Prednisone 40 mg qam. Will f/u in five days to assess symptoms.

## 2020-01-28 MED ORDER — PREDNISONE 20 MG PO TABS: 40.0000 mg | ORAL_TABLET | Freq: Every day | ORAL | 0 refills | Status: DC

## 2020-01-28 NOTE — Progress Notes (Signed)
Internal Medicine Clinic Attending  Case discussed with Dr. Seawell  At the time of the visit.  We reviewed the resident's history and exam and pertinent patient test results.  I agree with the assessment, diagnosis, and plan of care documented in the resident's note.  

## 2020-01-31 ENCOUNTER — Other Ambulatory Visit: Payer: Self-pay

## 2020-01-31 ENCOUNTER — Emergency Department (HOSPITAL_COMMUNITY): Payer: Medicare HMO

## 2020-01-31 ENCOUNTER — Inpatient Hospital Stay (HOSPITAL_COMMUNITY)
Admission: EM | Admit: 2020-01-31 | Discharge: 2020-02-03 | DRG: 286 | Disposition: A | Discharge status: Home / Self Care | Payer: Medicare HMO | Attending: Internal Medicine | Admitting: Internal Medicine

## 2020-01-31 ENCOUNTER — Telehealth: Payer: Self-pay

## 2020-01-31 DIAGNOSIS — M109 Gout, unspecified: Secondary | ICD-10-CM | POA: Diagnosis present

## 2020-01-31 DIAGNOSIS — X58XXXA Exposure to other specified factors, initial encounter: Secondary | ICD-10-CM | POA: Diagnosis present

## 2020-01-31 DIAGNOSIS — I517 Cardiomegaly: Secondary | ICD-10-CM | POA: Diagnosis not present

## 2020-01-31 DIAGNOSIS — R570 Cardiogenic shock: Secondary | ICD-10-CM | POA: Diagnosis not present

## 2020-01-31 DIAGNOSIS — D631 Anemia in chronic kidney disease: Secondary | ICD-10-CM | POA: Diagnosis present

## 2020-01-31 DIAGNOSIS — Z20822 Contact with and (suspected) exposure to covid-19: Secondary | ICD-10-CM | POA: Diagnosis present

## 2020-01-31 DIAGNOSIS — I2510 Atherosclerotic heart disease of native coronary artery without angina pectoris: Secondary | ICD-10-CM | POA: Diagnosis present

## 2020-01-31 DIAGNOSIS — E785 Hyperlipidemia, unspecified: Secondary | ICD-10-CM | POA: Diagnosis present

## 2020-01-31 DIAGNOSIS — K219 Gastro-esophageal reflux disease without esophagitis: Secondary | ICD-10-CM | POA: Diagnosis present

## 2020-01-31 DIAGNOSIS — I43 Cardiomyopathy in diseases classified elsewhere: Secondary | ICD-10-CM | POA: Diagnosis present

## 2020-01-31 DIAGNOSIS — R001 Bradycardia, unspecified: Secondary | ICD-10-CM | POA: Diagnosis not present

## 2020-01-31 DIAGNOSIS — G40909 Epilepsy, unspecified, not intractable, without status epilepticus: Secondary | ICD-10-CM | POA: Diagnosis present

## 2020-01-31 DIAGNOSIS — N179 Acute kidney failure, unspecified: Secondary | ICD-10-CM | POA: Diagnosis present

## 2020-01-31 DIAGNOSIS — I350 Nonrheumatic aortic (valve) stenosis: Secondary | ICD-10-CM | POA: Diagnosis present

## 2020-01-31 DIAGNOSIS — Z7982 Long term (current) use of aspirin: Secondary | ICD-10-CM | POA: Diagnosis not present

## 2020-01-31 DIAGNOSIS — D72819 Decreased white blood cell count, unspecified: Secondary | ICD-10-CM | POA: Diagnosis present

## 2020-01-31 DIAGNOSIS — R062 Wheezing: Secondary | ICD-10-CM | POA: Diagnosis not present

## 2020-01-31 DIAGNOSIS — E861 Hypovolemia: Secondary | ICD-10-CM | POA: Diagnosis not present

## 2020-01-31 DIAGNOSIS — I959 Hypotension, unspecified: Secondary | ICD-10-CM | POA: Diagnosis not present

## 2020-01-31 DIAGNOSIS — Z79899 Other long term (current) drug therapy: Secondary | ICD-10-CM

## 2020-01-31 DIAGNOSIS — I5032 Chronic diastolic (congestive) heart failure: Secondary | ICD-10-CM | POA: Diagnosis present

## 2020-01-31 DIAGNOSIS — E872 Acidosis: Secondary | ICD-10-CM | POA: Diagnosis present

## 2020-01-31 DIAGNOSIS — E871 Hypo-osmolality and hyponatremia: Secondary | ICD-10-CM | POA: Diagnosis present

## 2020-01-31 DIAGNOSIS — I313 Pericardial effusion (noninflammatory): Secondary | ICD-10-CM | POA: Diagnosis present

## 2020-01-31 DIAGNOSIS — T502X5A Adverse effect of carbonic-anhydrase inhibitors, benzothiadiazides and other diuretics, initial encounter: Secondary | ICD-10-CM | POA: Diagnosis present

## 2020-01-31 DIAGNOSIS — N390 Urinary tract infection, site not specified: Secondary | ICD-10-CM | POA: Diagnosis present

## 2020-01-31 DIAGNOSIS — Z7983 Long term (current) use of bisphosphonates: Secondary | ICD-10-CM | POA: Diagnosis not present

## 2020-01-31 DIAGNOSIS — R571 Hypovolemic shock: Secondary | ICD-10-CM

## 2020-01-31 DIAGNOSIS — I9589 Other hypotension: Secondary | ICD-10-CM | POA: Diagnosis not present

## 2020-01-31 DIAGNOSIS — M810 Age-related osteoporosis without current pathological fracture: Secondary | ICD-10-CM | POA: Diagnosis present

## 2020-01-31 DIAGNOSIS — Z87891 Personal history of nicotine dependence: Secondary | ICD-10-CM

## 2020-01-31 DIAGNOSIS — J9811 Atelectasis: Secondary | ICD-10-CM | POA: Diagnosis not present

## 2020-01-31 DIAGNOSIS — R059 Cough, unspecified: Secondary | ICD-10-CM | POA: Diagnosis not present

## 2020-01-31 DIAGNOSIS — E860 Dehydration: Secondary | ICD-10-CM | POA: Diagnosis present

## 2020-01-31 DIAGNOSIS — K5909 Other constipation: Secondary | ICD-10-CM | POA: Diagnosis present

## 2020-01-31 DIAGNOSIS — I440 Atrioventricular block, first degree: Secondary | ICD-10-CM | POA: Diagnosis not present

## 2020-01-31 DIAGNOSIS — R1110 Vomiting, unspecified: Secondary | ICD-10-CM | POA: Diagnosis not present

## 2020-01-31 DIAGNOSIS — Z886 Allergy status to analgesic agent status: Secondary | ICD-10-CM

## 2020-01-31 DIAGNOSIS — R5381 Other malaise: Secondary | ICD-10-CM | POA: Diagnosis present

## 2020-01-31 DIAGNOSIS — G8929 Other chronic pain: Secondary | ICD-10-CM | POA: Diagnosis present

## 2020-01-31 DIAGNOSIS — R404 Transient alteration of awareness: Secondary | ICD-10-CM | POA: Diagnosis not present

## 2020-01-31 DIAGNOSIS — N1832 Chronic kidney disease, stage 3b: Secondary | ICD-10-CM | POA: Diagnosis present

## 2020-01-31 DIAGNOSIS — I6782 Cerebral ischemia: Secondary | ICD-10-CM | POA: Diagnosis not present

## 2020-01-31 DIAGNOSIS — I130 Hypertensive heart and chronic kidney disease with heart failure and stage 1 through stage 4 chronic kidney disease, or unspecified chronic kidney disease: Secondary | ICD-10-CM | POA: Diagnosis present

## 2020-01-31 DIAGNOSIS — M545 Low back pain, unspecified: Secondary | ICD-10-CM | POA: Diagnosis present

## 2020-01-31 DIAGNOSIS — Z8673 Personal history of transient ischemic attack (TIA), and cerebral infarction without residual deficits: Secondary | ICD-10-CM

## 2020-01-31 DIAGNOSIS — R0602 Shortness of breath: Secondary | ICD-10-CM

## 2020-01-31 DIAGNOSIS — R579 Shock, unspecified: Secondary | ICD-10-CM | POA: Diagnosis not present

## 2020-01-31 DIAGNOSIS — Z8249 Family history of ischemic heart disease and other diseases of the circulatory system: Secondary | ICD-10-CM

## 2020-01-31 DIAGNOSIS — I6389 Other cerebral infarction: Secondary | ICD-10-CM | POA: Diagnosis not present

## 2020-01-31 HISTORY — DX: Acute kidney failure, unspecified: N17.9

## 2020-01-31 LAB — BRAIN NATRIURETIC PEPTIDE: B Natriuretic Peptide: 167.9 pg/mL — ABNORMAL HIGH (ref 0.0–100.0)

## 2020-01-31 LAB — TROPONIN I (HIGH SENSITIVITY)
Troponin I (High Sensitivity): 32 ng/L — ABNORMAL HIGH (ref <18)
Troponin I (High Sensitivity): 35 ng/L — ABNORMAL HIGH (ref <18)

## 2020-01-31 LAB — CBC WITH DIFFERENTIAL/PLATELET
Abs Immature Granulocytes: 0.03 10*3/uL (ref 0.00–0.07)
Basophils Absolute: 0 10*3/uL (ref 0.0–0.1)
Basophils Relative: 0 %
Eosinophils Absolute: 0 10*3/uL (ref 0.0–0.5)
Eosinophils Relative: 1 %
HCT: 38 % (ref 36.0–46.0)
Hemoglobin: 12.1 g/dL (ref 12.0–15.0)
Immature Granulocytes: 1 %
Lymphocytes Relative: 17 %
Lymphs Abs: 0.6 10*3/uL — ABNORMAL LOW (ref 0.7–4.0)
MCH: 29.4 pg (ref 26.0–34.0)
MCHC: 31.8 g/dL (ref 30.0–36.0)
MCV: 92.2 fL (ref 80.0–100.0)
Monocytes Absolute: 0.3 10*3/uL (ref 0.1–1.0)
Monocytes Relative: 8 %
Neutro Abs: 2.5 10*3/uL (ref 1.7–7.7)
Neutrophils Relative %: 73 %
Platelets: 351 10*3/uL (ref 150–400)
RBC: 4.12 MIL/uL (ref 3.87–5.11)
RDW: 11.8 % (ref 11.5–15.5)
WBC: 3.4 10*3/uL — ABNORMAL LOW (ref 4.0–10.5)
nRBC: 0 % (ref 0.0–0.2)

## 2020-01-31 LAB — BASIC METABOLIC PANEL
Anion gap: 16 — ABNORMAL HIGH (ref 5–15)
BUN: 80 mg/dL — ABNORMAL HIGH (ref 8–23)
CO2: 20 mmol/L — ABNORMAL LOW (ref 22–32)
Calcium: 8.8 mg/dL — ABNORMAL LOW (ref 8.9–10.3)
Chloride: 96 mmol/L — ABNORMAL LOW (ref 98–111)
Creatinine, Ser: 2.11 mg/dL — ABNORMAL HIGH (ref 0.44–1.00)
GFR, Estimated: 24 mL/min — ABNORMAL LOW (ref >60)
Glucose, Bld: 157 mg/dL — ABNORMAL HIGH (ref 70–99)
Potassium: 4.4 mmol/L (ref 3.5–5.1)
Sodium: 132 mmol/L — ABNORMAL LOW (ref 135–145)

## 2020-01-31 LAB — PHENOBARBITAL LEVEL: Phenobarbital: 26.4 ug/mL (ref 15.0–30.0)

## 2020-01-31 LAB — RESP PANEL BY RT-PCR (FLU A&B, COVID) ARPGX2
Influenza A by PCR: NEGATIVE
Influenza B by PCR: NEGATIVE
SARS Coronavirus 2 by RT PCR: NEGATIVE

## 2020-01-31 LAB — LACTIC ACID, PLASMA: Lactic Acid, Venous: 1.3 mmol/L (ref 0.5–1.9)

## 2020-01-31 LAB — TSH: TSH: 4.577 u[IU]/mL — ABNORMAL HIGH (ref 0.350–4.500)

## 2020-01-31 MED ORDER — PHENOBARBITAL 32.4 MG PO TABS: 129.6000 mg | ORAL_TABLET | Freq: Every day | ORAL | Status: DC

## 2020-01-31 MED ORDER — ENOXAPARIN SODIUM 30 MG/0.3ML ~~LOC~~ SOLN: 30.0000 mg | SUBCUTANEOUS | Status: DC

## 2020-01-31 MED ORDER — PHENOBARBITAL 32.4 MG PO TABS
129.6000 mg | ORAL_TABLET | Freq: Every day | ORAL | Status: DC
  Administered 2020-01-31 – 2020-02-02 (×3): 129.6 mg via ORAL
  Filled 2020-01-31 (×4): qty 4

## 2020-01-31 MED ORDER — ACETAMINOPHEN 325 MG PO TABS
650.0000 mg | ORAL_TABLET | Freq: Once | ORAL | Status: AC
  Administered 2020-01-31: 650 mg via ORAL
  Filled 2020-01-31: qty 2

## 2020-01-31 MED ORDER — ACETAMINOPHEN 650 MG RE SUPP: 650.0000 mg | Freq: Four times a day (QID) | RECTAL | Status: DC | PRN

## 2020-01-31 MED ORDER — ZONISAMIDE 100 MG PO CAPS
200.0000 mg | ORAL_CAPSULE | Freq: Every day | ORAL | Status: DC
  Administered 2020-01-31 – 2020-02-02 (×3): 200 mg via ORAL
  Filled 2020-01-31 (×4): qty 2

## 2020-01-31 MED ORDER — LIDOCAINE 5 % EX PTCH
1.0000 | MEDICATED_PATCH | CUTANEOUS | Status: DC
  Administered 2020-01-31 – 2020-02-02 (×2): 1 via TRANSDERMAL
  Filled 2020-01-31 (×3): qty 1

## 2020-01-31 MED ORDER — ENOXAPARIN SODIUM 30 MG/0.3ML ~~LOC~~ SOLN
30.0000 mg | SUBCUTANEOUS | Status: DC
  Administered 2020-01-31: 30 mg via SUBCUTANEOUS
  Filled 2020-01-31: qty 0.3

## 2020-01-31 MED ORDER — ACETAMINOPHEN 325 MG PO TABS
650.0000 mg | ORAL_TABLET | Freq: Four times a day (QID) | ORAL | Status: DC | PRN
  Administered 2020-02-01 – 2020-02-02 (×2): 650 mg via ORAL
  Filled 2020-01-31 (×2): qty 2

## 2020-01-31 MED ORDER — SODIUM CHLORIDE 0.9 % IV BOLUS
250.0000 mL | Freq: Once | INTRAVENOUS | Status: AC
  Administered 2020-01-31: 250 mL via INTRAVENOUS

## 2020-01-31 MED ORDER — ASPIRIN EC 81 MG PO TBEC
81.0000 mg | DELAYED_RELEASE_TABLET | Freq: Every day | ORAL | Status: DC
  Administered 2020-02-01 – 2020-02-03 (×3): 81 mg via ORAL
  Filled 2020-01-31 (×4): qty 1

## 2020-01-31 MED ORDER — ASPIRIN 81 MG PO TBEC: 81.0000 mg | DELAYED_RELEASE_TABLET | Freq: Every day | ORAL | Status: DC

## 2020-01-31 MED ORDER — ZONISAMIDE 100 MG PO CAPS: 300.0000 mg | ORAL_CAPSULE | Freq: Every day | ORAL | Status: DC

## 2020-01-31 MED ORDER — ZONISAMIDE 100 MG PO CAPS: 200.0000 mg | ORAL_CAPSULE | Freq: Every day | ORAL | Status: DC

## 2020-01-31 NOTE — Telephone Encounter (Signed)
Received telephone call from patient with c/o "having difficulty breathing".  States she can't catch her breath and is SOB, with dizziness and states symptoms started this morning.  Denies chest pain.  Patient sounds hoarse during conversation, RN asked patient if she feels as if her voice is hoarse and she states "yes and her throat feels a little swollen".  RN instructed patient to present to ED and to call 911 immediately, she verbalized understanding and agrees.  Gentleman heard in background who also verbalized understanding. SChaplin, RN,BSN

## 2020-01-31 NOTE — ED Notes (Signed)
Pt bladder scanned-- 278mL

## 2020-01-31 NOTE — Telephone Encounter (Signed)
I agree

## 2020-01-31 NOTE — ED Provider Notes (Addendum)
[facility] EMERGENCY DEPARTMENT Provider Note   CSN: [id number] Arrival date & time: 01/31/20  1516     History Chief Complaint  Patient presents with  . Hypotension    Sherry [name] is a 73 y.o. female.  Patient with history of anemia, aortic stenosis, severe concentric left ventricular hypertrophy, HFpEF on furosemide, chronic kidney disease, multivessel coronary artery disease, chronic bradycardia -- presents to the emergency department for shortness of breath.  Patient states that she fell asleep very early this morning when she awoke she had pain in her throat which is making it difficult for her to breathe.  She describes it as a sore throat.  She has not had any significant chest pain.  She is unclear as to if she vomited today.  No diarrhea reported.  No recent fevers, cold or flu symptoms.  EMS was called by the patient's husband who stated that she was very lethargic today.  Per EMS report, blood pressures ranged from 01-02 systolic.  They placed patient on oxygen but was unable to get a pulse ox due to slow capillary refill.  The onset of this condition was acute. The course is constant. Aggravating factors: none. Alleviating factors: none.  She reports starting a new medicine for gout 3 days ago.  Otherwise denies any skin changes, new foods or medications.  She is allergic to ibuprofen.         Past Medical History:  Diagnosis Date  . Anemia   . Aortic stenosis   . Arthritis   . Bilateral lower extremity edema   . Bradycardia    a. 01/2013 - asymptomatic.  [name] Chronic diastolic heart failure ([place])   . CKD (chronic kidney disease)    a. baseline CKD stage III  . Coronary artery disease   . GERD (gastroesophageal reflux disease)   . Headache(784.0)   . Heart block AV second degree 02/15/2013  . Heart murmur   . Hyperlipidemia   . Hypertension   . Mobitz (type) I (Wenckebach's) atrioventricular block    a. 01/2013 - asymptomatic.  . Morbid  obesity ([place])   . Seizure disorder ([place])   . Seizures ([place])   . TIA (transient ischemic attack) 2014   pt stated she had "mini strokes"    Patient Active Problem List   Diagnosis Date Noted  . 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 Seizures ([place])   . Hyperlipidemia   . GERD (gastroesophageal reflux disease)   . Bilateral lower extremity edema   . Arthritis   . Blurry vision, bilateral 05/08/2016  . Thoracic aortic atherosclerosis ([place]) 05/08/2016  . Osteoporosis 10/11/2015  . Vitamin D deficiency 04/22/2014  . Health care maintenance 04/22/2014  . Coronary artery disease   . 1st degree AV block 03/05/2013  . Aortic stenosis 02/18/2013  . Chronic diastolic congestive heart failure ([place]) 11/11/2012  . Bradycardia 07/02/2012  . Multiple lacunar infarcts ([place]) 05/07/2012  . Late effects of cerebrovascular disease 01/23/2012  . Internal and external hemorrhoids without complication 72/53/6644  . Degenerative cervical disc 01/20/2012  . Intractable focal epilepsy with impairment of consciousness ([place]) 01/02/2012  . CKD (chronic kidney disease) stage 3, GFR 30-59 ml/min ([facility]) 04/28/2011  . Abnormality of gait- basically wheelchair bound 07/31/2009  . Essential hypertension 06/04/2006  . Osteoarthritis of lumbar spine 06/04/2006  . Epilepsy ([place]) 06/04/2006    Past Surgical History:  Procedure Laterality Date  . ABDOMINAL HYSTERECTOMY    . LEFT AND RIGHT HEART CATHETERIZATION WITH CORONARY ANGIOGRAM N/A 05/24/2013  Procedure: LEFT AND RIGHT HEART CATHETERIZATION WITH CORONARY ANGIOGRAM;  Surgeon: Sinclair Grooms, MD;  Location: [facility] CATH LAB;  Service: Cardiovascular;  Laterality: N/A;     OB History   No obstetric history on file.     Family History  Problem Relation Age of Onset  . Hypertension Other     Social History   Tobacco Use  . Smoking status: Former Smoker    Types: Cigarettes    Quit date: 06/03/2000    Years since quitting: 19.6  . Smokeless tobacco: Never Used   Vaping Use  . Vaping Use: Never used  Substance Use Topics  . Alcohol use: No    Alcohol/week: 0.0 standard drinks  . Drug use: No    Home Medications Prior to Admission medications   Medication Sig Start Date End Date Taking? Authorizing Provider  alendronate (FOSAMAX) 70 MG tablet TAKE HALF A TABLET BY MOUTH ONCE A WEEK 01/21/20   Seawell, Jaimie A, DO  amLODipine (NORVASC) 10 MG tablet TAKE ONE TABLET BY MOUTH DAILY 09/07/19   Marianna Payment, MD  aspirin 81 MG EC tablet Take 81 mg by mouth daily.     [provider]  Calcium Carb-Cholecalciferol (CALCIUM 500 +D) 500-400 MG-UNIT TABS Take 1 tablet by mouth 2 (two) times daily. 10/25/15   Mignon Pine, DO  furosemide (LASIX) 40 MG tablet Take 1 tablet (40 mg total) by mouth daily. 10/27/19   Seawell, Jaimie A, DO  lisinopril (ZESTRIL) 40 MG tablet Take 1 tablet (40 mg total) by mouth daily. 10/27/19   Seawell, Jaimie A, DO  PHENobarbital (LUMINAL) 64.8 MG tablet Take 2 tablets (129.6 mg total) by mouth daily. 10/20/19   Cameron Sprang, MD  pravastatin (PRAVACHOL) 40 MG tablet TAKE ONE TABLET BY MOUTH AT BEDTIME 10/22/19   Cato Mulligan, MD  predniSONE (DELTASONE) 20 MG tablet Take 2 tablets (40 mg total) by mouth daily with breakfast. 01/28/20   Seawell, Andris Baumann A, DO  zonisamide (ZONEGRAN) 100 MG capsule Take 3 capsules every night 10/20/19   Cameron Sprang, MD    Allergies    Ibuprofen  Review of Systems   Review of Systems  Constitutional: Negative for fever.  HENT: Positive for sore throat. Negative for rhinorrhea.   Eyes: Negative for redness.  Respiratory: Positive for shortness of breath. Negative for cough.   Cardiovascular: Positive for leg swelling (Chronic). Negative for chest pain.  Gastrointestinal: Positive for vomiting (Patient thinks that she might have). Negative for abdominal pain, diarrhea and nausea.  Genitourinary: Negative for dysuria, frequency, hematuria and urgency.  Musculoskeletal: Negative for  myalgias.  Skin: Negative for rash.  Neurological: Negative for headaches.    Physical Exam Updated Vital Signs BP 110/60   Pulse 73   Temp (!) 97 F (36.1 C) (Rectal)   Resp 20   Ht 5\' 1"  (1.549 m)   Wt 62.4 kg   SpO2 100%   BMI 25.99 kg/m   Physical Exam Vitals and nursing note reviewed.  Constitutional:      General: She is not in acute distress.    Appearance: She is well-developed.  HENT:     Head: Normocephalic and atraumatic.     Right Ear: External ear normal.     Left Ear: External ear normal.     Nose: Nose normal.     Mouth/Throat:     Mouth: Mucous membranes are moist.     Pharynx: No oropharyngeal exudate or posterior oropharyngeal erythema.  Comments: No obvious swelling of the tongue or posterior oropharynx, no signs of infection when patient protrudes her tongue. Eyes:     Conjunctiva/sclera: Conjunctivae normal.  Neck:     Comments: Patient is able to look left and right without significant discomfort. Cardiovascular:     Rate and Rhythm: Normal rate and regular rhythm.     Heart sounds: Murmur (high-pitched) heard.    Pulmonary:     Effort: No respiratory distress.     Breath sounds: Rhonchi present. No wheezing or rales.     Comments: Occasional cough during exam.  Rhonchi noted. Abdominal:     Palpations: Abdomen is soft.     Tenderness: There is no abdominal tenderness. There is no guarding or rebound.  Musculoskeletal:     Cervical back: Normal range of motion and neck supple.     Right lower leg: Edema present.     Left lower leg: Edema present.  Skin:    General: Skin is warm and dry.     Findings: No rash.  Neurological:     General: No focal deficit present.     Mental Status: She is alert. Mental status is at baseline.     Motor: No weakness.  Psychiatric:        Mood and Affect: Mood normal.     ED Results / Procedures / Treatments   Labs (all labs ordered are listed, but only abnormal results are displayed) Labs  Reviewed  CBC WITH DIFFERENTIAL/PLATELET - Abnormal; Notable for the following components:      Result Value   WBC 3.4 (*)    Lymphs Abs 0.6 (*)    All other components within normal limits  BASIC METABOLIC PANEL - Abnormal; Notable for the following components:   Sodium 132 (*)    Chloride 96 (*)    CO2 20 (*)    Glucose, Bld 157 (*)    BUN 80 (*)    Creatinine, Ser 2.11 (*)    Calcium 8.8 (*)    GFR, Estimated 24 (*)    Anion gap 16 (*)    All other components within normal limits  BRAIN NATRIURETIC PEPTIDE - Abnormal; Notable for the following components:   B Natriuretic Peptide 167.9 (*)    All other components within normal limits  TROPONIN I (HIGH SENSITIVITY) - Abnormal; Notable for the following components:   Troponin I (High Sensitivity) 32 (*)    All other components within normal limits  RESP PANEL BY RT-PCR (FLU A&B, COVID) ARPGX2  LACTIC ACID, PLASMA  URINALYSIS, ROUTINE W REFLEX MICROSCOPIC  TROPONIN I (HIGH SENSITIVITY)    EKG EKG Interpretation  Date/Time:  Monday January 31 2020 15:38:51 EST Ventricular Rate:  53 PR Interval:    QRS Duration: 106 QT Interval:  447 QTC Calculation: 420 R Axis:   50 Text Interpretation: Sinus rhythm Prolonged PR interval Abnormal R-wave progression, early transition Nonspecific T abnrm, anterolateral leads Confirmed by Gerlene Fee [id number]) on 01/31/2020 4:09:29 PM   Radiology CT Head Wo Contrast  Result Date: 01/31/2020 CLINICAL DATA:  Mental status change, unknown cause. Additional history provided: Shortness of breath, vomiting. EXAM: CT HEAD WITHOUT CONTRAST TECHNIQUE: Contiguous axial images were obtained from the base of the skull through the vertex without intravenous contrast. COMPARISON:  Brain MRI 11/01/2006.  Head CT 01/23/2004. FINDINGS: Brain: Mild cerebral and cerebellar atrophy. Redemonstrated chronic small-vessel infarcts within the left corona radiata/frontal white matter. Prominent perivascular space  within the left subinsular region. Background moderate  ill-defined hypoattenuation within the cerebral white matter which is nonspecific, but compatible chronic small vessel ischemic disease. There is no acute infarct. No evidence of intracranial mass. No chronic intracranial blood products. No extra-axial fluid collection. No midline shift. Vascular: No hyperdense vessel.  Atherosclerotic calcifications. Skull: Normal. Negative for fracture or focal lesion. Sinuses/Orbits: Visualized orbits show no acute finding. No significant paranasal sinus disease. IMPRESSION: No evidence of acute intracranial abnormality. Redemonstrated chronic small-vessel infarcts within the left cerebral white matter. Background moderate cerebral white matter chronic small vessel ischemic disease. Mild parenchymal atrophy. Electronically Signed   By: Kellie Simmering DO   On: 01/31/2020 17:13   DG Chest Port 1 View  Result Date: 01/31/2020 CLINICAL DATA:  Shortness of breath. EXAM: PORTABLE CHEST 1 VIEW COMPARISON:  01/06/2019 FINDINGS: Chronic cardiomegaly not significantly changed from prior exam. Aortic atherosclerosis. There is no pulmonary edema. No pleural fluid or pneumothorax. No focal airspace disease. Degenerative change of both shoulders. IMPRESSION: Stable chronic cardiomegaly. No acute chest findings. Electronically Signed   By: Keith Rake M.D.   On: 01/31/2020 16:14    Procedures Procedures (including critical care time)  Medications Ordered in ED Medications  lidocaine (LIDODERM) 5 % 1 patch (1 patch Transdermal Patch Applied 01/31/20 1815)  sodium chloride 0.9 % bolus 250 mL (250 mLs Intravenous New Bag/Given 01/31/20 1814)  acetaminophen (TYLENOL) tablet 650 mg (650 mg Oral Given 01/31/20 1815)    ED Course  I have reviewed the triage vital signs and the nursing notes.  Pertinent labs & imaging results that were available during my care of the patient were reviewed by me and considered in my medical  decision making (see chart for details).  Patient seen and examined. Work-up initiated.  Arrival complaint and patient history is vague. Symptoms not classic for CHF exacerbation, but is on differential. Consider ACS, PE, anaphylaxis, pharyngitis/epiglotitis/retropharyngeal abscess, PNA, CHF exacerbation. She does not appear to be in acute distress.  Temp 97.41F rectally. Reported 100% O2 with pulse ox placed on ear lobe.   Vital signs reviewed and are as follows: Ht 5\' 1"  (1.549 m)   Wt 62.4 kg   BMI 25.99 kg/m   3:50 PM EKG reviewed. BP 100's/40's on monitor, sinus bradycardia with 1st degree block. Discussed with Dr. Sedonia Small who will see.   6:36 PM Pt has been stable during multiple rechecks.  Husband is now at bedside.  We reviewed results with him and patient.  Patient is currently mainly complaining of chronic lower back pain.  I have ordered Tylenol, her usual home medication, and Lidoderm patch for this.  No reported recent falls.  She has been eating and drinking well.  Husband corroborates EMS report.  Patient does have acute kidney injury and worsening kidney function.  Head CT is reassuring.  Chest x-ray with unchanged chronic cardiomegaly.  BNP mild elevation, lower than previous, doubt significantly worsening CHF. Slightly elevated troponin, likely 2/2 AKI and CKD -- 2nd marker pending to establish trend.   Pt appears to be euvolemic at this point. Will give small 250cc fluid bolus 2/2 AKI and uremia.   I requested consult/admission from [facility] for AKI and lethargy. They have agreed to see patient in ED.   BP (!) 103/56 (BP Location: Right Arm)   Pulse 73   Temp (!) 97 F (36.1 C) (Rectal)   Resp 16   Ht 5\' 1"  (1.549 m)   Wt 62.4 kg   SpO2 100%   BMI 25.99 kg/m  7:04 PM Troponin 32 > 35.     MDM Rules/Calculators/A&P                          Admit.   Final Clinical Impression(s) / ED Diagnoses Final diagnoses:  AKI (acute kidney injury) ([place])  Shortness of breath     Rx / DC Orders ED Discharge Orders    None       Carlisle Cater, PA-C 01/31/20 1841    Carlisle Cater, PA-C 01/31/20 1904    Maudie Flakes, MD 01/31/20 2324

## 2020-01-31 NOTE — H&P (Addendum)
Date: 01/31/2020               [name] Name:  Sherry [name] MRN: [id number]  DOB: [birth year] Age / Sex: 74 y.o., female   PCP: Marty Heck, DO         Medical Service: Internal Medicine Teaching Service         Attending Physician: Dr. Velna Ochs, MD    First Contact: Dr. Honor Junes, MD Pager: [phone]  Second Contact: Dr. Jose Persia, MD Pager: [phone]       After Hours (After 5p/  First Contact Pager: [phone]  weekends / holidays): Second Contact Pager: [phone]   Chief Complaint: cough, SHOB  History of Present Illness:   S[name] is a 74 year old female with history of HTN, HLD, CAD, aortic stenosis, Mobitz type 1 AV block (2014), chronic diastolic HF, chronic bradycardia, and seizure disorder presenting to [name] for [name] and cough that began this afternoon.   Husband was at bedside during my encounter.  [name] reports that [name] was in [name] usual state of health today until this afternoon. [name] started having some stomach pain, which [name] and [name] husband attributed to [name] chronic constipation. [name] drank some prune juice and sat [name] on a bedside commode to try and pass a BM. While on [name] commode, [name] began experiencing progressive SHOB and also began having a non-productive cough around [name] same time. Husband called [name] and was told to call 911 and have [name] come to [name]. While waiting for EMS to arrive, [name] was feeling nauseous and had one episode of NBNB emesis. After EMS arrived, they noted that [name] [name]'s SBP ranged from 70s-90s. EMS placed [name] on oxygen but were unable to obtain a pulse ox due to delayed capillary refill. [name] was subsequently brought in to [name] ED. Denies headache, fevers, chills, congestion, chest pain, palpitations, abdominal pain, diarrhea, dysuria, hematuria, recent sick contacts. [name] does endorse fatigue and chronic constipation.  Husband states that [name] was recently placed on prednisone  for [name] gout and was concerned if [name] symptoms were caused by [name] prednisone.   Of note, [name] has a history of severe aortic stenosis and HFpEF. [name] reports that [name] lasix dose was recently increased.  During my encounter, [name] complaining of significant left hip pain that shoots down [name] leg. As per [name], this has been a chronic issue. Denies any recent falls.  Lastly, [name] does have a history of CKD III. [name] reports that [name] is able to urinate and has actually been having increased urinary frequency since [name] lasix dose was increased to [name] point where [name] has to wake up at night to urinate as well. [name] does complain of some burning with urination at times.   ED course: [name] found to be hypotensive (100s/40s) with EKG showing sinus bradycardia with 1st degree block (chronic). [name] was c/o chronic low back pain and was given tylenol, home med, and lidoderm patch. CBC showing leukopenia (seen on prior labs as well). BMP showing sodium 132, HCO3 20, AG 16, glucose 157, Cr 2.11, BUN 80. Troponins 32 > 35. BNP 167.9. Lactic acid 1.3. COVID-19 and influenza negative. Urinalysis pending. CT head with no acute intracranial abnormality. CXR showing stable chronic cardiomegaly with no acute chest findings. IMTS was asked to admit [name] for AKI.   Meds:  No current facility-administered medications on file prior to encounter.   Current Outpatient Medications on File Prior to Encounter  Medication  Sig Dispense Refill  . alendronate (FOSAMAX) 70 MG tablet TAKE HALF A TABLET BY MOUTH ONCE A WEEK ([name] taking differently: Take 70 mg by mouth once a week.) 6 tablet 0  . amLODipine (NORVASC) 10 MG tablet TAKE ONE TABLET BY MOUTH DAILY ([name] taking differently: Take 10 mg by mouth daily.) 90 tablet 2  . aspirin 81 MG EC tablet Take 81 mg by mouth daily.    . Calcium Carb-Cholecalciferol (CALCIUM 500 +D) 500-400 MG-UNIT TABS Take 1 tablet by mouth 2 (two) times daily. 60 tablet 2  .  furosemide (LASIX) 40 MG tablet Take 1 tablet (40 mg total) by mouth daily. 90 tablet 2  . lisinopril (ZESTRIL) 40 MG tablet Take 1 tablet (40 mg total) by mouth daily. 90 tablet 2  . PHENobarbital (LUMINAL) 64.8 MG tablet Take 2 tablets (129.6 mg total) by mouth daily. ([name] [name] taking differently: Take 129.6 mg by mouth at bedtime.) 180 tablet 3  . pravastatin (PRAVACHOL) 40 MG tablet TAKE ONE TABLET BY MOUTH AT BEDTIME ([name] taking differently: Take 40 mg by mouth at bedtime.) 90 tablet 1  . predniSONE (DELTASONE) 20 MG tablet Take 2 tablets (40 mg total) by mouth daily with breakfast. 10 tablet 0  . zonisamide (ZONEGRAN) 100 MG capsule Take 3 capsules every night ([name] taking differently: Take 300 mg by mouth at bedtime.) 270 capsule 3     Allergies: Allergies as of 01/31/2020 - Review Complete 01/31/2020  Allergen Reaction Noted  . Ibuprofen Other (See Comments) 10/30/2012   Past Medical History:  Diagnosis Date  . Anemia   . Aortic stenosis   . Arthritis   . Bilateral lower extremity edema   . Bradycardia    a. 01/2013 - asymptomatic.  [name] Chronic diastolic heart failure ([place])   . CKD (chronic kidney disease)    a. baseline CKD stage III  . Coronary artery disease   . GERD (gastroesophageal reflux disease)   . Headache(784.0)   . Heart block AV second degree 02/15/2013  . Heart murmur   . Hyperlipidemia   . Hypertension   . Mobitz (type) I (Wenckebach's) atrioventricular block    a. 01/2013 - asymptomatic.  . Morbid obesity ([place])   . Seizure disorder ([place])   . Seizures ([place])   . TIA (transient ischemic attack) 2014   pt stated [name] had "mini strokes"    Family History:  Family History  Problem Relation Age of Onset  . Hypertension Other     Social History: Social History   Socioeconomic History  . Marital status: Married    Spouse name: Not on file  . Number of children: 2  . Years of education: 4  . Highest education level: Not on file  Occupational  History  . Occupation: retired  Tobacco Use  . Smoking status: Former Smoker    Types: Cigarettes    Quit date: 06/03/2000    Years since quitting: 19.6  . Smokeless tobacco: Never Used  Vaping Use  . Vaping Use: Never used  Substance and Sexual Activity  . Alcohol use: No    Alcohol/week: 0.0 standard drinks  . Drug use: No  . Sexual activity: Not Currently    Partners: Male  Other Topics Concern  . Not on file  Social History Narrative   Right handed   One story home   Drinks no caffeine    Social Determinants of Health   Financial Resource Strain: Not on file  Food Insecurity: Not on file  Transportation Needs: Not on file  Physical Activity: Not on file  Stress: Not on file  Social Connections: Not on file  Intimate Partner Violence: Not on file    Review of Systems: A complete ROS was negative except as per HPI.   Physical Exam: Blood pressure (!) 95/56, pulse 82, temperature (!) 97 F (36.1 C), temperature source Rectal, resp. rate 15, height 5\' 1"  (1.549 m), weight 62.4 kg, SpO2 100 %. Physical Exam Constitutional:      Comments: Elderly female appearing chronically ill, lying in bed, NAD.  HENT:     Head: Normocephalic and atraumatic.     Nose: Nose normal. No congestion.     Mouth/Throat:     Mouth: Mucous membranes are dry.     Pharynx: Oropharynx is clear.     Comments: No sign of tongue swelling. Posterior oropharynx with no erythema or exudate. Eyes:     Extraocular Movements: Extraocular movements intact.     Conjunctiva/sclera: Conjunctivae normal.     Pupils: Pupils are equal, round, and reactive to light.  Cardiovascular:     Rate and Rhythm: Normal rate and regular rhythm.     Pulses: Normal pulses.     Heart sounds: Murmur heard.  No friction rub. No gallop.   Pulmonary:     Breath sounds: No stridor. No wheezing or rales.     Comments: [name] does not appear to be in acute respiratory distress. However, rhonchi noted bilaterally  throughout.  Abdominal:     General: Bowel sounds are normal. There is no distension.     Palpations: Abdomen is soft.     Tenderness: There is no abdominal tenderness. There is no guarding or rebound.  Musculoskeletal:        General: Normal range of motion.     Cervical back: Normal range of motion.     Comments: Trace BLE edema, chronic. Left hip TTP and ROM limited in left hip.  Skin:    General: Skin is warm and dry.     Comments: Decreased skin turgor.   Neurological:     Mental Status: [name] is alert and oriented to person, place, and time. Mental status is at baseline.  Psychiatric:        Mood and Affect: Mood normal.        Behavior: Behavior normal.        Thought Content: Thought content normal.     EKG EKG Interpretation  Date/Time:                  Monday January 31 2020 15:38:51 EST Ventricular Rate:         53 PR Interval:                   QRS Duration: 106 QT Interval:                 447 QTC Calculation:        420 R Axis:                         50 Text Interpretation:      Sinus rhythm Prolonged PR interval Abnormal R-wave progression, early transition Nonspecific T abnrm, anterolateral leads Confirmed by Gerlene Fee [phone]) on 01/31/2020 4:09:29 PM   CT Head Wo Contrast  Result Date: 01/31/2020 CLINICAL DATA:  Mental status change, unknown cause. Additional history provided: Shortness of breath, vomiting. EXAM: CT HEAD WITHOUT CONTRAST TECHNIQUE: Contiguous axial  images were obtained from [name] base of [name] skull through [name] vertex without intravenous contrast. COMPARISON:  Brain MRI 11/01/2006.  Head CT 01/23/2004. FINDINGS: Brain: Mild cerebral and cerebellar atrophy. Redemonstrated chronic small-vessel infarcts within [name] left corona radiata/frontal white matter. Prominent perivascular space within [name] left subinsular region. Background moderate ill-defined hypoattenuation within [name] cerebral white matter which is nonspecific, but compatible chronic small  vessel ischemic disease. There is no acute infarct. No evidence of intracranial mass. No chronic intracranial blood products. No extra-axial fluid collection. No midline shift. Vascular: No hyperdense vessel.  Atherosclerotic calcifications. Skull: Normal. Negative for fracture or focal lesion. Sinuses/Orbits: Visualized orbits show no acute finding. No significant paranasal sinus disease. IMPRESSION: No evidence of acute intracranial abnormality. Redemonstrated chronic small-vessel infarcts within [name] left cerebral white matter. Background moderate cerebral white matter chronic small vessel ischemic disease. Mild parenchymal atrophy. Electronically Signed   By: Kellie Simmering DO   On: 01/31/2020 17:13   DG Chest Port 1 View  Result Date: 01/31/2020 CLINICAL DATA:  Shortness of breath. EXAM: PORTABLE CHEST 1 VIEW COMPARISON:  01/06/2019 FINDINGS: Chronic cardiomegaly not significantly changed from prior exam. Aortic atherosclerosis. There is no pulmonary edema. No pleural fluid or pneumothorax. No focal airspace disease. Degenerative change of both shoulders. IMPRESSION: Stable chronic cardiomegaly. No acute chest findings. Electronically Signed   By: Keith Rake M.D.   On: 01/31/2020 16:14     Assessment & Plan by Problem: Active Problems:   AKI (acute kidney injury) [facility])  Ms. Federer is a 74 yo female with history of HTN, HLD, CAD, HFpEF, aortic stenosis, Mobitz type 1 AV block, chronic bradycardia, CKD stage III, and seizure disorder presenting to [name] and cough, admitted for an AKI.  AKI on CKD III Baseline creatinine appears to be around 1.3 - 1.4. On admission, creatinine 2.11. [name]'s dry weight appears to be around 63-64kg upon chart review and [name] weight today is 62.4kg. [name]'s AKI is likely prerenal in nature from dehydration in [name] setting of recently increased lasix dose. [name]'s mucous membranes are dry and [name] has decreased skin turgor on exam. Furthermore, [name] is  mildly hyponatremia as well.  - Given 279mL of NS in [name] ED - f/u urinalysis w/reflex - f/u urine urea and urine creatinine to determine pre-renal vs intrinsic - monitor UOP, I/O's - avoid nephrotoxic medications - holding lasix at this time  Cough SHOB Symptoms began abruptly earlier this afternoon. Cough is non-productive. Rhonchi were noted bilaterally throughout. [name]'s SHOB could be due to exacerbation of [name] aortic stenosis in [name] setting of being volume depleted. [name] is COVID-19 and influenza negative. Denies any sick contacts. CXR was negative. Overall, [name]'s cough and SHOB were not too impressive. Although [name] had a couple of bouts of coughing during our encounter, [name] was not hypoxic or in any respiratory distress. This could simply be from a viral URI. - supportive care - supplemental O2 prn to keep sats >90%   HTN HLD CAD Chronic and stable. On norvasc 10mg  daily and lisinopril 40mg  daily for HTN. On pravastatin 40mg  qhs for HLD. On ASA 81mg  for CAD. - continue ASA 81mg  daily - holding home anti-HTN meds as [name] was hypotensive on admission    HFpEF On lasix 40mg  daily at home. [name] presented to [name] ED on 01/20/20 with b/l LE swelling, which was when [name] lasix was increased to 60mg  daily for 1 week and then dose was reduced back to 40mg  daily when [name] saw  [name] PCP on 01/27/20. - holding lasix due to AKI   Severe Aortic Stenosis Mobitz Type 1 AV block (2014) Chronic bradycardia All of these are chronic and stable.   Seizure Disorder Stable. First began in 1998, with some recurrence but chart review shows last seizure around 2015. [name] herself reports that [name] has not had a seizure in a while now. On phenobarbitol 129.6mg  qhs and zonegran 300mg  qhs. - continue home phenobarbitol - decreased dose of zonegran to 200mg  qhs in [name] setting of AKI (can increase to home dose once AKI is resolved)   Leukopenia Appears to be chronic, dating back to 5-6 years  ago. Unsure as to etiology of this and unsure if any workup has been done for this, so will obtain a path smear for further evaluation. - f/u path/tech smear   Dispo: Admit [name] to Observation with expected length of stay less than 2 midnights.  Signed: Virl Axe, MD 01/31/2020, 9:41 PM  Pager: [phone] After 5pm on weekdays and 1pm on weekends: On Call pager: [phone]

## 2020-01-31 NOTE — ED Notes (Signed)
Provider placed pt on [place] for comfort

## 2020-01-31 NOTE — ED Notes (Signed)
Pt's husband called 911  Pt lethargic, vomited prior to EMS transport, SOB. Pt hypotensive with EMS SBP 70s-90s. EMS unable to get reading for pulse ox-- noted slow cap refill and expiratory wheezing. Placed on O2 4LNC  Hx of CHF

## 2020-01-31 NOTE — Hospital Course (Addendum)
Ms. Bascom 74 y.o. female with a history of severe aortic stenosis, CKD stage III, chronic diastolic heart failure, CAD, hypertension who presents with hypovolemic shock    Hypovolemic shock: Resolved: Secondary to overdiuresis.  Lactate normalized with IV fluids.  Echo showed LVEF 55 to 60%, severe LVH, severe paradoxical low flow low gradient AS.  [place]ana on 12/14 showed RA 2, RV 27/7, PA 30/2/13, PCWP 5, CI 2.7, PA sat 66%, aortic valve mean gradient 22. Resolved with IV fluids, currently normotensive.  Hold further diuresis   AKI: Improved: Creatinine 2.1 on admission.  Suspect prerenal in setting of dehydration, improved with IV fluids.  Creatinine 1.45   UTI: Resolved-s/p Ceftriaxone   Pericardial effusion: Appears moderate on echo 12/14 with intermittent RV diastolic collapse seen, though IVC small/collapsible and no significant mitral inflow respiratory variation. She has chronic pericardial effusion, suspect seeing some early tamponade physiology in setting of severe dehydration with low RA/RV pressures.  Would expect to be resolved now that she has been fluid resuscitated.  Aortic stenosis: Echo suggests paradoxical low-flow low gradient severe AS.  Follows with Dr. Burt Knack   Severe LVH: Concerning for cardiac amyloidosis.  PYP scan negative in 2019.  Light chains/SPEP/UPEP pending to evaluate for AL amyloid  Normocytic Anemia New this admission. No clinical evidence of bleeding. Iron studies shows Iron 20, TIBC 154, Ferritin 222. Folate 8.2 , Vitamin B12: 296. Possibly anemia of chronic disease.  Last colonoscopy was in 2008, report not available. Will need outpatient follow up and GI referral for repeat screening colonoscopy

## 2020-02-01 ENCOUNTER — Encounter (HOSPITAL_COMMUNITY)
Admission: EM | Disposition: A | Discharge status: Home / Self Care | Payer: Self-pay | Source: Home / Self Care | Attending: Internal Medicine

## 2020-02-01 ENCOUNTER — Encounter: Payer: Self-pay | Admitting: *Deleted

## 2020-02-01 ENCOUNTER — Observation Stay (HOSPITAL_COMMUNITY): Payer: Medicare HMO

## 2020-02-01 DIAGNOSIS — R570 Cardiogenic shock: Secondary | ICD-10-CM

## 2020-02-01 DIAGNOSIS — I313 Pericardial effusion (noninflammatory): Secondary | ICD-10-CM | POA: Diagnosis not present

## 2020-02-01 DIAGNOSIS — I2510 Atherosclerotic heart disease of native coronary artery without angina pectoris: Secondary | ICD-10-CM | POA: Diagnosis present

## 2020-02-01 DIAGNOSIS — I6389 Other cerebral infarction: Secondary | ICD-10-CM | POA: Diagnosis not present

## 2020-02-01 DIAGNOSIS — E871 Hypo-osmolality and hyponatremia: Secondary | ICD-10-CM | POA: Diagnosis present

## 2020-02-01 DIAGNOSIS — X58XXXA Exposure to other specified factors, initial encounter: Secondary | ICD-10-CM | POA: Diagnosis not present

## 2020-02-01 DIAGNOSIS — N1832 Chronic kidney disease, stage 3b: Secondary | ICD-10-CM | POA: Diagnosis present

## 2020-02-01 DIAGNOSIS — E872 Acidosis: Secondary | ICD-10-CM | POA: Diagnosis not present

## 2020-02-01 DIAGNOSIS — I959 Hypotension, unspecified: Secondary | ICD-10-CM

## 2020-02-01 DIAGNOSIS — N390 Urinary tract infection, site not specified: Secondary | ICD-10-CM | POA: Diagnosis not present

## 2020-02-01 DIAGNOSIS — E860 Dehydration: Secondary | ICD-10-CM | POA: Diagnosis present

## 2020-02-01 DIAGNOSIS — K219 Gastro-esophageal reflux disease without esophagitis: Secondary | ICD-10-CM | POA: Diagnosis present

## 2020-02-01 DIAGNOSIS — Z79899 Other long term (current) drug therapy: Secondary | ICD-10-CM | POA: Diagnosis not present

## 2020-02-01 DIAGNOSIS — R1110 Vomiting, unspecified: Secondary | ICD-10-CM | POA: Diagnosis not present

## 2020-02-01 DIAGNOSIS — R404 Transient alteration of awareness: Secondary | ICD-10-CM | POA: Diagnosis not present

## 2020-02-01 DIAGNOSIS — M109 Gout, unspecified: Secondary | ICD-10-CM | POA: Diagnosis present

## 2020-02-01 DIAGNOSIS — R001 Bradycardia, unspecified: Secondary | ICD-10-CM

## 2020-02-01 DIAGNOSIS — R0602 Shortness of breath: Secondary | ICD-10-CM | POA: Diagnosis not present

## 2020-02-01 DIAGNOSIS — G40909 Epilepsy, unspecified, not intractable, without status epilepticus: Secondary | ICD-10-CM | POA: Diagnosis present

## 2020-02-01 DIAGNOSIS — I350 Nonrheumatic aortic (valve) stenosis: Principal | ICD-10-CM

## 2020-02-01 DIAGNOSIS — I130 Hypertensive heart and chronic kidney disease with heart failure and stage 1 through stage 4 chronic kidney disease, or unspecified chronic kidney disease: Secondary | ICD-10-CM | POA: Diagnosis not present

## 2020-02-01 DIAGNOSIS — I9589 Other hypotension: Secondary | ICD-10-CM | POA: Diagnosis not present

## 2020-02-01 DIAGNOSIS — I5032 Chronic diastolic (congestive) heart failure: Secondary | ICD-10-CM | POA: Diagnosis not present

## 2020-02-01 DIAGNOSIS — Z20822 Contact with and (suspected) exposure to covid-19: Secondary | ICD-10-CM | POA: Diagnosis not present

## 2020-02-01 DIAGNOSIS — N179 Acute kidney failure, unspecified: Secondary | ICD-10-CM | POA: Diagnosis not present

## 2020-02-01 DIAGNOSIS — D631 Anemia in chronic kidney disease: Secondary | ICD-10-CM | POA: Diagnosis present

## 2020-02-01 DIAGNOSIS — R579 Shock, unspecified: Secondary | ICD-10-CM

## 2020-02-01 DIAGNOSIS — I6782 Cerebral ischemia: Secondary | ICD-10-CM | POA: Diagnosis not present

## 2020-02-01 DIAGNOSIS — I517 Cardiomegaly: Secondary | ICD-10-CM | POA: Diagnosis not present

## 2020-02-01 DIAGNOSIS — E861 Hypovolemia: Secondary | ICD-10-CM | POA: Diagnosis not present

## 2020-02-01 DIAGNOSIS — T502X5A Adverse effect of carbonic-anhydrase inhibitors, benzothiadiazides and other diuretics, initial encounter: Secondary | ICD-10-CM | POA: Diagnosis present

## 2020-02-01 DIAGNOSIS — R059 Cough, unspecified: Secondary | ICD-10-CM | POA: Diagnosis not present

## 2020-02-01 DIAGNOSIS — Z7982 Long term (current) use of aspirin: Secondary | ICD-10-CM | POA: Diagnosis not present

## 2020-02-01 DIAGNOSIS — K5909 Other constipation: Secondary | ICD-10-CM | POA: Diagnosis present

## 2020-02-01 DIAGNOSIS — R062 Wheezing: Secondary | ICD-10-CM | POA: Diagnosis not present

## 2020-02-01 DIAGNOSIS — E785 Hyperlipidemia, unspecified: Secondary | ICD-10-CM | POA: Diagnosis present

## 2020-02-01 DIAGNOSIS — J9811 Atelectasis: Secondary | ICD-10-CM | POA: Diagnosis not present

## 2020-02-01 DIAGNOSIS — I440 Atrioventricular block, first degree: Secondary | ICD-10-CM | POA: Diagnosis not present

## 2020-02-01 DIAGNOSIS — R571 Hypovolemic shock: Secondary | ICD-10-CM | POA: Diagnosis not present

## 2020-02-01 DIAGNOSIS — I43 Cardiomyopathy in diseases classified elsewhere: Secondary | ICD-10-CM | POA: Diagnosis present

## 2020-02-01 DIAGNOSIS — Z7983 Long term (current) use of bisphosphonates: Secondary | ICD-10-CM | POA: Diagnosis not present

## 2020-02-01 HISTORY — PX: RIGHT HEART CATH: CATH118263

## 2020-02-01 HISTORY — PX: LEFT HEART CATH: CATH118248

## 2020-02-01 LAB — CBC WITH DIFFERENTIAL/PLATELET
Abs Immature Granulocytes: 0.04 10*3/uL (ref 0.00–0.07)
Abs Immature Granulocytes: 0.04 10*3/uL (ref 0.00–0.07)
Basophils Absolute: 0 10*3/uL (ref 0.0–0.1)
Basophils Absolute: 0 10*3/uL (ref 0.0–0.1)
Basophils Relative: 0 %
Basophils Relative: 0 %
Eosinophils Absolute: 0 10*3/uL (ref 0.0–0.5)
Eosinophils Absolute: 0 10*3/uL (ref 0.0–0.5)
Eosinophils Relative: 0 %
Eosinophils Relative: 0 %
HCT: 33.9 % — ABNORMAL LOW (ref 36.0–46.0)
HCT: 31.5 % — ABNORMAL LOW (ref 36.0–46.0)
Hemoglobin: 10.7 g/dL — ABNORMAL LOW (ref 12.0–15.0)
Hemoglobin: 10.7 g/dL — ABNORMAL LOW (ref 12.0–15.0)
Immature Granulocytes: 0 %
Immature Granulocytes: 0 %
Lymphocytes Relative: 4 %
Lymphocytes Relative: 8 %
Lymphs Abs: 0.4 10*3/uL — ABNORMAL LOW (ref 0.7–4.0)
Lymphs Abs: 0.8 10*3/uL (ref 0.7–4.0)
MCH: 29.2 pg (ref 26.0–34.0)
MCH: 30.6 pg (ref 26.0–34.0)
MCHC: 31.6 g/dL (ref 30.0–36.0)
MCHC: 34 g/dL (ref 30.0–36.0)
MCV: 92.4 fL (ref 80.0–100.0)
MCV: 90 fL (ref 80.0–100.0)
Monocytes Absolute: 0.7 10*3/uL (ref 0.1–1.0)
Monocytes Absolute: 0.7 10*3/uL (ref 0.1–1.0)
Monocytes Relative: 6 %
Monocytes Relative: 7 %
Neutro Abs: 9.9 10*3/uL — ABNORMAL HIGH (ref 1.7–7.7)
Neutro Abs: 7.8 10*3/uL — ABNORMAL HIGH (ref 1.7–7.7)
Neutrophils Relative %: 90 %
Neutrophils Relative %: 85 %
Platelets: 366 10*3/uL (ref 150–400)
Platelets: 400 10*3/uL (ref 150–400)
RBC: 3.67 MIL/uL — ABNORMAL LOW (ref 3.87–5.11)
RBC: 3.5 MIL/uL — ABNORMAL LOW (ref 3.87–5.11)
RDW: 11.8 % (ref 11.5–15.5)
RDW: 12.2 % (ref 11.5–15.5)
WBC: 11.1 10*3/uL — ABNORMAL HIGH (ref 4.0–10.5)
WBC: 9.3 10*3/uL (ref 4.0–10.5)
nRBC: 0 % (ref 0.0–0.2)
nRBC: 0 % (ref 0.0–0.2)

## 2020-02-01 LAB — TECHNOLOGIST SMEAR REVIEW

## 2020-02-01 LAB — POCT I-STAT 7, (LYTES, BLD GAS, ICA,H+H)
Acid-base deficit: 3 mmol/L — ABNORMAL HIGH (ref 0.0–2.0)
Bicarbonate: 21.6 mmol/L (ref 20.0–28.0)
Calcium, Ion: 1.03 mmol/L — ABNORMAL LOW (ref 1.15–1.40)
HCT: 28 % — ABNORMAL LOW (ref 36.0–46.0)
Hemoglobin: 9.5 g/dL — ABNORMAL LOW (ref 12.0–15.0)
O2 Saturation: 100 %
Potassium: 4.5 mmol/L (ref 3.5–5.1)
Sodium: 134 mmol/L — ABNORMAL LOW (ref 135–145)
TCO2: 23 mmol/L (ref 22–32)
pCO2 arterial: 36 mmHg (ref 32.0–48.0)
pH, Arterial: 7.385 (ref 7.350–7.450)
pO2, Arterial: 189 mmHg — ABNORMAL HIGH (ref 83.0–108.0)

## 2020-02-01 LAB — BASIC METABOLIC PANEL
Anion gap: 16 — ABNORMAL HIGH (ref 5–15)
BUN: 79 mg/dL — ABNORMAL HIGH (ref 8–23)
CO2: 17 mmol/L — ABNORMAL LOW (ref 22–32)
Calcium: 8.5 mg/dL — ABNORMAL LOW (ref 8.9–10.3)
Chloride: 98 mmol/L (ref 98–111)
Creatinine, Ser: 1.88 mg/dL — ABNORMAL HIGH (ref 0.44–1.00)
GFR, Estimated: 28 mL/min — ABNORMAL LOW (ref >60)
Glucose, Bld: 148 mg/dL — ABNORMAL HIGH (ref 70–99)
Potassium: 4.2 mmol/L (ref 3.5–5.1)
Sodium: 131 mmol/L — ABNORMAL LOW (ref 135–145)

## 2020-02-01 LAB — ECHOCARDIOGRAM LIMITED
AR max vel: 1.43 cm2
AV Area VTI: 1.85 cm2
AV Area mean vel: 1.48 cm2
AV Mean grad: 25 mmHg
AV Peak grad: 45.6 mmHg
Ao pk vel: 3.38 m/s
Area-P 1/2: 2.16 cm2
Height: 61 in
S' Lateral: 2.7 cm
Weight: 2201.07 oz

## 2020-02-01 LAB — LACTIC ACID, PLASMA
Lactic Acid, Venous: 2.5 mmol/L (ref 0.5–1.9)
Lactic Acid, Venous: 1.1 mmol/L (ref 0.5–1.9)

## 2020-02-01 LAB — POCT ACTIVATED CLOTTING TIME: Activated Clotting Time: 148 seconds

## 2020-02-01 LAB — COMPREHENSIVE METABOLIC PANEL
ALT: 42 U/L (ref 0–44)
AST: 39 U/L (ref 15–41)
Albumin: 2.6 g/dL — ABNORMAL LOW (ref 3.5–5.0)
Alkaline Phosphatase: 67 U/L (ref 38–126)
Anion gap: 14 (ref 5–15)
BUN: 79 mg/dL — ABNORMAL HIGH (ref 8–23)
CO2: 16 mmol/L — ABNORMAL LOW (ref 22–32)
Calcium: 8.3 mg/dL — ABNORMAL LOW (ref 8.9–10.3)
Chloride: 99 mmol/L (ref 98–111)
Creatinine, Ser: 1.91 mg/dL — ABNORMAL HIGH (ref 0.44–1.00)
GFR, Estimated: 27 mL/min — ABNORMAL LOW (ref >60)
Glucose, Bld: 157 mg/dL — ABNORMAL HIGH (ref 70–99)
Potassium: 4.7 mmol/L (ref 3.5–5.1)
Sodium: 129 mmol/L — ABNORMAL LOW (ref 135–145)
Total Bilirubin: 0.5 mg/dL (ref 0.3–1.2)
Total Protein: 6.4 g/dL — ABNORMAL LOW (ref 6.5–8.1)

## 2020-02-01 LAB — URINALYSIS, ROUTINE W REFLEX MICROSCOPIC
Bilirubin Urine: NEGATIVE
Glucose, UA: NEGATIVE mg/dL
Hgb urine dipstick: NEGATIVE
Ketones, ur: NEGATIVE mg/dL
Nitrite: NEGATIVE
Protein, ur: NEGATIVE mg/dL
Specific Gravity, Urine: 1.014 (ref 1.005–1.030)
pH: 5 (ref 5.0–8.0)

## 2020-02-01 LAB — POCT I-STAT EG7
Acid-base deficit: 5 mmol/L — ABNORMAL HIGH (ref 0.0–2.0)
Bicarbonate: 20.5 mmol/L (ref 20.0–28.0)
Calcium, Ion: 0.91 mmol/L — ABNORMAL LOW (ref 1.15–1.40)
HCT: 26 % — ABNORMAL LOW (ref 36.0–46.0)
Hemoglobin: 8.8 g/dL — ABNORMAL LOW (ref 12.0–15.0)
O2 Saturation: 66 %
Potassium: 3.9 mmol/L (ref 3.5–5.1)
Sodium: 138 mmol/L (ref 135–145)
TCO2: 22 mmol/L (ref 22–32)
pCO2, Ven: 37.4 mmHg — ABNORMAL LOW (ref 44.0–60.0)
pH, Ven: 7.347 (ref 7.250–7.430)
pO2, Ven: 36 mmHg (ref 32.0–45.0)

## 2020-02-01 LAB — PATHOLOGIST SMEAR REVIEW

## 2020-02-01 LAB — SODIUM, URINE, RANDOM: Sodium, Ur: 14 mmol/L

## 2020-02-01 LAB — CREATININE, URINE, RANDOM: Creatinine, Urine: 108.99 mg/dL

## 2020-02-01 LAB — MRSA PCR SCREENING: MRSA by PCR: POSITIVE — AB

## 2020-02-01 LAB — OSMOLALITY, URINE: Osmolality, Ur: 386 mOsm/kg (ref 300–900)

## 2020-02-01 LAB — PROCALCITONIN: Procalcitonin: 0.27 ng/mL

## 2020-02-01 LAB — OSMOLALITY: Osmolality: 302 mOsm/kg — ABNORMAL HIGH (ref 275–295)

## 2020-02-01 SURGERY — RIGHT HEART CATH
Anesthesia: LOCAL

## 2020-02-01 MED ORDER — LACTATED RINGERS IV BOLUS
500.0000 mL | Freq: Once | INTRAVENOUS | Status: AC
  Administered 2020-02-01: 500 mL via INTRAVENOUS

## 2020-02-01 MED ORDER — ACETAMINOPHEN 325 MG PO TABS: 650.0000 mg | ORAL_TABLET | ORAL | Status: DC | PRN

## 2020-02-01 MED ORDER — SODIUM CHLORIDE 0.9 % IV SOLN
INTRAVENOUS | Status: AC | PRN
  Administered 2020-02-01 (×2): 10 mL/h via INTRAVENOUS

## 2020-02-01 MED ORDER — LABETALOL HCL 5 MG/ML IV SOLN: 10.0000 mg | INTRAVENOUS | Status: AC | PRN

## 2020-02-01 MED ORDER — VERAPAMIL HCL 2.5 MG/ML IV SOLN
INTRAVENOUS | Status: DC | PRN
  Administered 2020-02-01: 10 mL via INTRA_ARTERIAL

## 2020-02-01 MED ORDER — LIDOCAINE HCL (PF) 1 % IJ SOLN
INTRAMUSCULAR | Status: AC
  Filled 2020-02-01: qty 30

## 2020-02-01 MED ORDER — LIDOCAINE HCL (PF) 1 % IJ SOLN
INTRAMUSCULAR | Status: DC | PRN
  Administered 2020-02-01: 5 mL

## 2020-02-01 MED ORDER — SODIUM CHLORIDE 0.9% FLUSH: 3.0000 mL | INTRAVENOUS | Status: DC | PRN

## 2020-02-01 MED ORDER — ENOXAPARIN SODIUM 30 MG/0.3ML ~~LOC~~ SOLN
30.0000 mg | SUBCUTANEOUS | Status: DC
  Administered 2020-02-02 – 2020-02-03 (×2): 30 mg via SUBCUTANEOUS
  Filled 2020-02-01 (×2): qty 0.3

## 2020-02-01 MED ORDER — FENTANYL CITRATE (PF) 100 MCG/2ML IJ SOLN
INTRAMUSCULAR | Status: DC | PRN
  Administered 2020-02-01: 25 ug via INTRAVENOUS

## 2020-02-01 MED ORDER — FENTANYL CITRATE (PF) 100 MCG/2ML IJ SOLN
INTRAMUSCULAR | Status: AC
  Filled 2020-02-01: qty 2

## 2020-02-01 MED ORDER — LACTATED RINGERS IV BOLUS: 500.0000 mL | Freq: Once | INTRAVENOUS | Status: DC

## 2020-02-01 MED ORDER — SODIUM CHLORIDE 0.9 % IV SOLN
1.0000 g | INTRAVENOUS | Status: DC
  Administered 2020-02-01 – 2020-02-03 (×3): 1 g via INTRAVENOUS
  Filled 2020-02-01 (×2): qty 1
  Filled 2020-02-01: qty 10

## 2020-02-01 MED ORDER — ONDANSETRON HCL 4 MG/2ML IJ SOLN: 4.0000 mg | Freq: Four times a day (QID) | INTRAMUSCULAR | Status: DC | PRN

## 2020-02-01 MED ORDER — SODIUM CHLORIDE 0.9% FLUSH
3.0000 mL | Freq: Two times a day (BID) | INTRAVENOUS | Status: DC
  Administered 2020-02-02 – 2020-02-03 (×3): 3 mL via INTRAVENOUS

## 2020-02-01 MED ORDER — SODIUM CHLORIDE 0.9 % IV SOLN
250.0000 mL | INTRAVENOUS | Status: DC | PRN
  Administered 2020-02-02: 250 mL via INTRAVENOUS

## 2020-02-01 MED ORDER — HYDRALAZINE HCL 20 MG/ML IJ SOLN: 10.0000 mg | INTRAMUSCULAR | Status: AC | PRN

## 2020-02-01 MED ORDER — CHLORHEXIDINE GLUCONATE CLOTH 2 % EX PADS
6.0000 | MEDICATED_PAD | Freq: Every day | CUTANEOUS | Status: DC
  Administered 2020-02-02 – 2020-02-03 (×2): 6 via TOPICAL

## 2020-02-01 MED ORDER — HEPARIN (PORCINE) IN NACL 1000-0.9 UT/500ML-% IV SOLN
INTRAVENOUS | Status: DC | PRN
  Administered 2020-02-01: 500 mL

## 2020-02-01 MED ORDER — MUPIROCIN 2 % EX OINT
1.0000 "application " | TOPICAL_OINTMENT | Freq: Two times a day (BID) | CUTANEOUS | Status: DC
  Administered 2020-02-02 – 2020-02-03 (×3): 1 via NASAL
  Filled 2020-02-01 (×2): qty 22

## 2020-02-01 MED ORDER — MIDAZOLAM HCL 2 MG/2ML IJ SOLN
INTRAMUSCULAR | Status: AC
  Filled 2020-02-01: qty 2

## 2020-02-01 MED ORDER — SODIUM CHLORIDE 0.9 % IV SOLN: INTRAVENOUS | Status: AC

## 2020-02-01 SURGICAL SUPPLY — 15 items
CATH INFINITI JR4 5F (CATHETERS) ×2 IMPLANT
CATH SWAN GANZ VIP 7.5F (CATHETERS) ×2 IMPLANT
GLIDESHEATH SLEND SS 6F .021 (SHEATH) ×2 IMPLANT
GUIDEWIRE INQWIRE 1.5J.035X260 (WIRE) ×1 IMPLANT
INQWIRE 1.5J .035X260CM (WIRE) ×2
KIT HEART LEFT (KITS) ×2 IMPLANT
KIT MICROPUNCTURE NIT STIFF (SHEATH) ×2 IMPLANT
PACK CARDIAC CATHETERIZATION (CUSTOM PROCEDURE TRAY) ×2 IMPLANT
SHEATH GLIDE SLENDER 4/5FR (SHEATH) ×2 IMPLANT
SHEATH PINNACLE 8F 10CM (SHEATH) ×2 IMPLANT
SHEATH PROBE COVER 6X72 (BAG) ×2 IMPLANT
SLEEVE REPOSITIONING LENGTH 30 (MISCELLANEOUS) ×2 IMPLANT
TRANSDUCER W/STOPCOCK (MISCELLANEOUS) ×4 IMPLANT
TUBING ART PRESS 72  MALE/FEM (TUBING) ×2
TUBING ART PRESS 72 MALE/FEM (TUBING) ×1 IMPLANT

## 2020-02-01 NOTE — Progress Notes (Signed)
Paged regarding hypotension x2 on both arms, approximately ~ 72/50. Patient was evaluated at bedside. She denies any acute complaints, including chest pain, SOB, dizziness.   On evaluation, patient alert and oriented, no acute neurological deficits, appears at baseline compared to examination earlier in the morning. 2+ peripheral pulses. No respiratory distress, maintain 100% oxygen saturation.   Will plan for bolus trial and re-check BP. Patient does have findings consistent with UTI but no evidence of systemic infection to suspect septic shock. I am concerned this is an indication of worsening aortic stenosis with known hx of severe D3 aortic stenosis. Cardiology consulted placed.   Dr. Jose Persia Internal Medicine PGY-2  Pager: [phone] After 5pm on weekdays and 1pm on weekends: On Call pager [phone]  02/01/2020, 12:02 PM

## 2020-02-01 NOTE — Consult Note (Signed)
Advanced Heart Failure Team Consult Note   Primary Physician: Marty Heck, DO PCP-Cardiologist:  Sherren Mocha, MD  Reason for Consultation: Cardiogenic shock  HPI:    Sherry [name] is seen today for evaluation of cardiogenic shock at the request of *Dr. Gardiner Rhyme  Sherry [name] is 74 y/o woman with CAD, aortic stenosis, HTN, hypertophic CM. She has been followed by Dr. Burt Knack and has been quite debilitated.   Given her severe LVH. She had prior testing for cardiac amyloid.  MRI was suggestive of amyloidosis but PYP was negative.   She was admitted for weakness and hypotension. Received 2L NS and was persistently hypotensive. Echo as below.  Based on concern for ongoing cardiogenic shock was taken to the cath lab for Needles which showed very low filling pressures, normal cardiac output and moderate AS.    Ao = 108/56 (76) LV = 136/4 RA =  2 RV = 27/7 PA = 30/2 (13) PCW = 5 Fick cardiac output/index = 4.3/2.7 Thermo CO/CI = 4.1/2.5 PVR = 2.0 WU FA sat = 99% PA sat = 66% No RV/LV interaction AoV: mean gradient 17mHG AVA 1.47 cm2 (Fick) 1.38 cm2 (Thermo)    Echo today showed 1. Left ventricular ejection fraction, by estimation, is 55 to 60%. The  left ventricle has normal function. There is severe concentric left  ventricular hypertrophy. Diastology indeterminant due to severe MAC.  2. Right ventricular systolic function is normal. The right ventricular  size is normal. Tricuspid regurgitation signal is inadequate for assessing  PA pressure.  3. A small, circumferential pericaridal effusion is present. Appears to  be largest around the RV measuring 1.1cm at end diastole.  4. The mitral valve is abnormal. Trivial mitral valve regurgitation. No  evidence of mitral stenosis. Severe mitral annular calcification.  5. The aortic valve is calcified. There is severe calcifcation of the  aortic valve. There is severe thickening of the aortic valve. Aortic valve   regurgitation is not visualized. Suspect severe low flow, low gradient  aortic stenosis with preserved EF.  Mean gradient 334mg, peak 5845m, vmax 3.58m/258mAVA 1.2cm2 by continuty.  DOI 0.28. Suspect AVA and DOI underestimate severity of AS due to  difficulty obtaining coxial doppler interrogation.  6. The inferior vena cava is normal in size with greater than 50%  respiratory variability, suggesting right atrial pressure of 3 mmHg.  7. Findings concerning for possible amyloidosis.     Review of Systems: [y] = yes, _0  = no   . General: Weight gain _1 ; Weight loss _2 ; Anorexia _3 ; Fatigue [y ]Blue.ReeseFever _4 ; Chills _5 ; Weakness [y ]Blue.Reese. Cardiac: Chest pain/pressure _6 ; Resting SOB [ y]; Exertional SOB [ y]; Orthopnea _7 ; Pedal Edema [y ]Blue.ReesePalpitations _8 ; Syncope [ y]; Presyncope [y ]Blue.ReeseParoxysmal nocturnal dyspnea_9   . Pulmonary: Cough [y ]Blue.ReeseWheezing_10 ; Hemoptysis_11 ; Sputum _12 ; Snoring _13   . GI: Vomiting_14 ; Dysphagia_15 ; Melena_16 ; Hematochezia _17 ; Heartburn_18 ; Abdominal pain _19 ; Constipation _20 ; Diarrhea _21 ; BRBPR _22   . GU: Hematuria_23 ; Dysuria _24 ; Nocturia_25   . Vascular: Pain in legs with walking [y ]Blue.ReesePain in feet with lying flat _26 ; Non-healing sores _27 ; Stroke _28 ; TIA _29 ; Slurred speech _30 ;  . Neuro: Headaches[ y]; Vertigo_31 ; Seizures[ y]; Paresthesias_32 ;Blurred vision _33 ; Diplopia _34 ; Vision changes _35   . Ortho/Skin:  Arthritis Blue.Reese ]; Joint pain [ y]; Muscle pain _0 ; Joint swelling _1 ; Back Pain Blue.Reese ]; Rash _2   . Psych: Depression_3 ; Anxiety_4   . Heme: Bleeding problems _5 ; Clotting disorders _6 ; Anemia _7   . Endocrine: Diabetes _8 ; Thyroid dysfunction_9   Home Medications Prior to Admission medications   Medication Sig Start Date End Date Taking? Authorizing Provider  alendronate (FOSAMAX) 70 MG tablet TAKE HALF A TABLET BY MOUTH ONCE A WEEK Patient taking differently: Take 70 mg by mouth once a week. 01/21/20  Yes Seawell, Jaimie A, DO   amLODipine (NORVASC) 10 MG tablet TAKE ONE TABLET BY MOUTH DAILY Patient taking differently: Take 10 mg by mouth daily. 09/07/19  Yes Marianna Payment, MD  aspirin 81 MG EC tablet Take 81 mg by mouth daily.   Yes [provider]  Calcium Carb-Cholecalciferol (CALCIUM 500 +D) 500-400 MG-UNIT TABS Take 1 tablet by mouth 2 (two) times daily. 10/25/15  Yes Mignon Pine, DO  furosemide (LASIX) 40 MG tablet Take 1 tablet (40 mg total) by mouth daily. 10/27/19  Yes Seawell, Jaimie A, DO  lisinopril (ZESTRIL) 40 MG tablet Take 1 tablet (40 mg total) by mouth daily. 10/27/19  Yes Seawell, Jaimie A, DO  PHENobarbital (LUMINAL) 64.8 MG tablet Take 2 tablets (129.6 mg total) by mouth daily. Patient taking differently: Take 129.6 mg by mouth at bedtime. 10/20/19  Yes Cameron Sprang, MD  pravastatin (PRAVACHOL) 40 MG tablet TAKE ONE TABLET BY MOUTH AT BEDTIME Patient taking differently: Take 40 mg by mouth at bedtime. 10/22/19  Yes Cato Mulligan, MD  predniSONE (DELTASONE) 20 MG tablet Take 2 tablets (40 mg total) by mouth daily with breakfast. 01/28/20  Yes Seawell, Jaimie A, DO  zonisamide (ZONEGRAN) 100 MG capsule Take 3 capsules every night Patient taking differently: Take 300 mg by mouth at bedtime. 10/20/19  Yes Cameron Sprang, MD    Past Medical History: Past Medical History:  Diagnosis Date  . Anemia   . Aortic stenosis   . Arthritis   . Bilateral lower extremity edema   . Bradycardia    a. 01/2013 - asymptomatic.  [name] Chronic diastolic heart failure ([place])   . CKD (chronic kidney disease)    a. baseline CKD stage III  . Coronary artery disease   . GERD (gastroesophageal reflux disease)   . Headache(784.0)   . Heart block AV second degree 02/15/2013  . Heart murmur   . Hyperlipidemia   . Hypertension   . Mobitz (type) I (Wenckebach's) atrioventricular block    a. 01/2013 - asymptomatic.  . Morbid obesity ([place])   . Seizure disorder ([place])   . Seizures ([place])   . TIA (transient  ischemic attack) 2014   pt stated she had "mini strokes"    Past Surgical History: Past Surgical History:  Procedure Laterality Date  . ABDOMINAL HYSTERECTOMY    . LEFT AND RIGHT HEART CATHETERIZATION WITH CORONARY ANGIOGRAM N/A 05/24/2013   Procedure: LEFT AND RIGHT HEART CATHETERIZATION WITH CORONARY ANGIOGRAM;  Surgeon: Sinclair Grooms, MD;  Location: [facility] CATH LAB;  Service: Cardiovascular;  Laterality: N/A;    Family History: Family History  Problem Relation Age of Onset  . Hypertension Other     Social History: Social History   Socioeconomic History  . Marital status: Married    Spouse name: Not on file  . Number of children: 2  . Years of education: 4  . Highest education level:  Not on file  Occupational History  . Occupation: retired  Tobacco Use  . Smoking status: Former Smoker    Types: Cigarettes    Quit date: 06/03/2000    Years since quitting: 19.6  . Smokeless tobacco: Never Used  Vaping Use  . Vaping Use: Never used  Substance and Sexual Activity  . Alcohol use: No    Alcohol/week: 0.0 standard drinks  . Drug use: No  . Sexual activity: Not Currently    Partners: Male  Other Topics Concern  . Not on file  Social History Narrative   Right handed   One story home   Drinks no caffeine    Social Determinants of Health   Financial Resource Strain: Not on file  Food Insecurity: Not on file  Transportation Needs: Not on file  Physical Activity: Not on file  Stress: Not on file  Social Connections: Not on file    Allergies:  Allergies  Allergen Reactions  . Ibuprofen Other (See Comments)    Dr advised pt not to take ibuprofen    Objective:    Vital Signs:   Temp:  [97.6 F (36.4 C)-98.7 F (37.1 C)] 97.6 F (36.4 C) (12/14 0739) Pulse Rate:  [0-144] 0 (12/14 1822) Resp:  [0-46] 0 (12/14 1822) BP: (72-132)/(39-73) 124/67 (12/14 1813) SpO2:  [0 %-100 %] 0 % (12/14 1822)    Weight change: Filed Weights   01/31/20 1535  Weight: 62.4  kg    Intake/Output:   Intake/Output Summary (Last 24 hours) at 02/01/2020 1900 Last data filed at 02/01/2020 1413 Gross per 24 hour  Intake 1750 ml  Output --  Net 1750 ml      Physical Exam    General:  Well appearing. No resp difficulty HEENT: normal Neck: supple. JVP . Carotids 2+ bilat; no bruits. No lymphadenopathy or thyromegaly appreciated. Cor: PMI nondisplaced. Regular rate & rhythm. No rubs, gallops or murmurs. Lungs: clear Abdomen: soft, nontender, nondistended. No hepatosplenomegaly. No bruits or masses. Good bowel sounds. Extremities: no cyanosis, clubbing, rash, edema Neuro: alert & orientedx3, cranial nerves grossly intact. moves all 4 extremities w/o difficulty. Affect pleasant   Telemetry   EKG:  sinus brady, first degree AV block, TWI lateral leads, nonspecific ST changes anterior leads Personally reviewed  Telemetry: SR, 1st degree AV block, HR 60s Personally reviewed  Labs   Basic Metabolic Panel: Recent Labs  Lab 01/27/20 1032 01/31/20 1528 02/01/20 0227 02/01/20 0744 02/01/20 0915 02/01/20 1749 02/01/20 1752  NA 136 132* 129* SPECIMEN HEMOLYZED. HEMOLYSIS MAY AFFECT INTEGRITY OF RESULTS. 131* 138 134*  K 5.1 4.4 4.7 SPECIMEN HEMOLYZED. HEMOLYSIS MAY AFFECT INTEGRITY OF RESULTS. 4.2 3.9 4.5  CL 102 96* 99 SPECIMEN HEMOLYZED. HEMOLYSIS MAY AFFECT INTEGRITY OF RESULTS. 98  --   --   CO2 22 20* 16* SPECIMEN HEMOLYZED. HEMOLYSIS MAY AFFECT INTEGRITY OF RESULTS. 17*  --   --   GLUCOSE 97 157* 157* SPECIMEN HEMOLYZED. HEMOLYSIS MAY AFFECT INTEGRITY OF RESULTS. 148*  --   --   BUN 39* 80* 79* SPECIMEN HEMOLYZED. HEMOLYSIS MAY AFFECT INTEGRITY OF RESULTS. 79*  --   --   CREATININE 1.51* 2.11* 1.91* SPECIMEN HEMOLYZED. HEMOLYSIS MAY AFFECT INTEGRITY OF RESULTS. 1.88*  --   --   CALCIUM 8.6* 8.8* 8.3* SPECIMEN HEMOLYZED. HEMOLYSIS MAY AFFECT INTEGRITY OF RESULTS. 8.5*  --   --   MG 2.7*  --   --   --   --   --   --  Liver Function Tests: Recent  Labs  Lab 02/01/20 0227  AST 39  ALT 42  ALKPHOS 67  BILITOT 0.5  PROT 6.4*  ALBUMIN 2.6*   No results for input(s): LIPASE, AMYLASE in the last 168 hours. No results for input(s): AMMONIA in the last 168 hours.  CBC: Recent Labs  Lab 01/31/20 1528 02/01/20 0227 02/01/20 0744 02/01/20 1749 02/01/20 1752  WBC 3.4* 11.1* 9.3  --   --   NEUTROABS 2.5 9.9* 7.8*  --   --   HGB 12.1 10.7* 10.7* 8.8* 9.5*  HCT 38.0 33.9* 31.5* 26.0* 28.0*  MCV 92.2 92.4 90.0  --   --   PLT 351 366 400  --   --     Cardiac Enzymes: No results for input(s): CKTOTAL, CKMB, CKMBINDEX, TROPONINI in the last 168 hours.  BNP: BNP (last 3 results) Recent Labs    01/19/20 1536 01/31/20 1528  BNP 268.8* 167.9*    ProBNP (last 3 results) No results for input(s): PROBNP in the last 8760 hours.   CBG: No results for input(s): GLUCAP in the last 168 hours.  Coagulation Studies: No results for input(s): LABPROT, INR in the last 72 hours.   Imaging   CARDIAC CATHETERIZATION  Result Date: 02/01/2020 Findings: Ao = 108/56 (76) LV = 136/4 RA =  2 RV = 27/7 PA = 30/2 (13) PCW = 5 Fick cardiac output/index = 4.3/2.7 Thermo CO/CI = 4.1/2.5 PVR = 2.0 WU FA sat = 99% PA sat = 66% No RV/LV interaction AoV: mean gradient 40mHG AVA 1.47 cm2 (Fick) 1.38 cm2 (Thermo) Assessment: 1. Low filling pressures with normal output 2. Moderate aortic stenosis Plan/Discussion: Continue hydration. DGlori Bickers MD 6:56 PM   ECHOCARDIOGRAM LIMITED  Result Date: 02/01/2020    ECHOCARDIOGRAM LIMITED REPORT   Patient Name:   Sherry WAMBLEDate of Exam: 02/01/2020 Medical Rec #:  [id number]        Height:       61.0 in Accession #:    [id number]       Weight:       137.6 lb Date of Birth:  409-Jul-1947        BSA:          1.611 m Patient Age:    89years          BP:           101/55 mmHg Patient Gender: F                 HR:           70 bpm. Exam Location:  Inpatient Procedure: Limited Echo, Limited Color  Doppler and Cardiac Doppler Indications:    Aortic Stenosis 424.1 / 135.0  History:        Patient has prior history of Echocardiogram examinations, most                 recent 10/07/2019. CHF, CAD; TIA. Chronic kidney disease.                 Bilateral lower extremity edema. Bradycardia. Heart block AV                 second degree. Mobitz Type I (Wenckebach) atrioventricular                 block.  Sonographer:    TDarlina SicilianRDCS Referring Phys: [id number] Ochs Sonographer Comments: Technically difficult study due to poor  echo windows. IMPRESSIONS  1. Left ventricular ejection fraction, by estimation, is 55 to 60%. The left ventricle has normal function. There is severe concentric left ventricular hypertrophy. Diastology indeterminant due to severe MAC.  2. Right ventricular systolic function is normal. The right ventricular size is normal. Tricuspid regurgitation signal is inadequate for assessing PA pressure.  3. A small, circumferential pericaridal effusion is present. Appears to be largest around the RV measuring 1.1cm at end diastole.  4. The mitral valve is abnormal. Trivial mitral valve regurgitation. No evidence of mitral stenosis. Severe mitral annular calcification.  5. The aortic valve is calcified. There is severe calcifcation of the aortic valve. There is severe thickening of the aortic valve. Aortic valve regurgitation is not visualized. Suspect severe low flow, low gradient aortic stenosis with preserved EF. Mean gradient 40mHg, peak 545mg, vmax 3.40m101m AVA 1.2cm2 by continuty. DOI 0.28. Suspect AVA and DOI underestimate severity of AS due to difficulty obtaining coxial doppler interrogation.  6. The inferior vena cava is normal in size with greater than 50% respiratory variability, suggesting right atrial pressure of 3 mmHg.  7. Findings concerning for possible amyloidosis. Comparison(s): Compared to prior study in 09/2019, there is no significant change. FINDINGS  Left Ventricle:  Left ventricular ejection fraction, by estimation, is 55 to 60%. The left ventricle has normal function. The left ventricular internal cavity size was small. There is severe concentric left ventricular hypertrophy. Diastology indeterminant due to severe MAC. Right Ventricle: The right ventricular size is normal. Right vetricular wall thickness was not well visualized. Right ventricular systolic function is normal. Tricuspid regurgitation signal is inadequate for assessing PA pressure. The tricuspid regurgitant velocity is 2.14 m/s, and with an assumed right atrial pressure of 3 mmHg, the estimated right ventricular systolic pressure is 21.17.4Hg. Pericardium: A small, circumferential pericaridal effusion is present. Appears to be largest around the RV measuring 1.1cm at end diastole. A small pericardial effusion is present. The pericardial effusion is circumferential. Mitral Valve: The mitral valve is abnormal. There is moderate thickening of the mitral valve leaflet(s). There is moderate calcification of the mitral valve leaflet(s). Severe mitral annular calcification. Trivial mitral valve regurgitation. No significant mitral stenosis. Tricuspid Valve: The tricuspid valve is normal in structure. Tricuspid valve regurgitation is mild. Aortic Valve: There is severe calcifcation of the aortic valve. There is severe thickening of the aortic valve. Aortic valve regurgitation is not visualized. Suspect severe low flow, low gradient aortic stenosis with preserved EF. Mean gradient 542m240m peak 540mm74mvmax 3.40m/s.24mA 1.2cm2 by continuty. DOI 0.28. Suspect AVA and DOI underestimate severity of AS due to difficulty obtaining coaxial doppler interrogation. Pulmonic Valve: The pulmonic valve was grossly normal. Pulmonic valve regurgitation is not visualized. Venous: The inferior vena cava is normal in size with greater than 50% respiratory variability, suggesting right atrial pressure of 3 mmHg. LEFT VENTRICLE PLAX 2D LVIDd:          3.80 cm  Diastology LVIDs:         2.70 cm  LV e' medial:    3.12 cm/s LV PW:         0.90 cm  LV E/e' medial:  22.9 LV IVS:        1.00 cm  LV e' lateral:   2.56 cm/s LVOT diam:     2.10 cm  LV E/e' lateral: 28.0 LV SV:         76 LV SV Index:   47 LVOT Area:     3.46  cm                          3D Volume EF:                         3D EF:        54 %                         LV EDV:       108 ml                         LV ESV:       50 ml                         LV SV:        58 ml AORTIC VALVE AV Area (Vmax):    1.43 cm AV Area (Vmean):   1.48 cm AV Area (VTI):     1.85 cm AV Vmax:           337.80 cm/s AV Vmean:          226.400 cm/s AV VTI:            0.412 m AV Peak Grad:      45.6 mmHg AV Mean Grad:      25.0 mmHg LVOT Vmax:         139.00 cm/s LVOT Vmean:        97.000 cm/s LVOT VTI:          0.220 m LVOT/AV VTI ratio: 0.53 MITRAL VALVE                TRICUSPID VALVE MV Area (PHT): 2.16 cm     TR Peak grad:   18.3 mmHg MV Peak grad:  7.5 mmHg     TR Vmax:        214.00 cm/s MV Mean grad:  3.0 mmHg MV Vmax:       1.37 m/s     SHUNTS MV Vmean:      79.8 cm/s    Systemic VTI:  0.22 m MV Decel Time: 352 msec     Systemic Diam: 2.10 cm MV E velocity: 71.60 cm/s MV A velocity: 129.00 cm/s MV E/A ratio:  0.56 Gwyndolyn Kaufman MD Electronically signed by Gwyndolyn Kaufman MD Signature Date/Time: 02/01/2020/4:38:56 PM    Final       Medications:     Current Medications: . aspirin EC  81 mg Oral Daily  . Chlorhexidine Gluconate Cloth  6 each Topical Daily  . [START ON 02/02/2020] enoxaparin (LOVENOX) injection  30 mg Subcutaneous Q24H  . lidocaine  1 patch Transdermal Q24H  . PHENobarbital  129.6 mg Oral QHS  . sodium chloride flush  3 mL Intravenous Q12H  . zonisamide  200 mg Oral QHS     Infusions: . sodium chloride 100 mL/hr at 02/01/20 1848  . sodium chloride    . cefTRIAXone (ROCEPHIN)  IV 1 g (02/01/20 1641)  . lactated ringers         Assessment/Plan   1. Cardiogenic  shock 2. CKD IV  3. Aortic stenosis  4. Severe LVH 5. Pericardial effusion 6. 3v CAD  Presentation concerning for cardiogenic shock with rising lactic acidosis. Echo reviewed personally and discussed with Dr. Gardiner Rhyme shows normal LV function with  severe LVH which appears underfilled. The aortic valve is heavily calcified and raises the questions of low gradient severe AS. There is also a moderate pericardial effusion with concern for possible early tamponade.   Given worsening hypotension and lactic acidosis despite IVF resuscitation we took her to the cath lab emergently and findings c/w volume depletion and moderate AS. Will give another liter of NS.   In reviewing her imaging studies (ECHO, MRI and PYP) I agree with concern for cardiac amyloidosis with severe LVH, valvular disease and heart block however PYP in 2019 was resoundingly negative despite severe LVH and thus she is unlikely to have TTR amyloid. Natural history of her disease is not c/w AL amyloid. She may have another amyloid variant but I suspect she more likely has hypertrophic CM. Given her debilitated state doubt that we will have much to offer her other than careful volume management. Will hydrate and watch in ICU overnight. Suspect we can pull swan in am and transfer back to floor.   CRITICAL CARE Performed by: Glori Bickers  Total critical care time: 60 minutes  Critical care time was exclusive of separately billable procedures and treating other patients.  Critical care was necessary to treat or prevent imminent or life-threatening deterioration.  Critical care was time spent personally by me (independent of midlevel providers or residents) on the following activities: development of treatment plan with patient and/or surrogate as well as nursing, discussions with consultants, evaluation of patient's response to treatment, examination of patient, obtaining history from patient or surrogate, ordering and performing  treatments and interventions, ordering and review of laboratory studies, ordering and review of radiographic studies, pulse oximetry and re-evaluation of patient's condition.    Length of Stay: 0  Glori Bickers, MD  02/01/2020, 7:00 PM  Advanced Heart Failure Team Pager [phone] (M-F; 7a - 4p)  Please contact [facility] for night-coverage after hours (4p -7a ) and weekends on [email or web address]

## 2020-02-01 NOTE — ED Notes (Signed)
Pt unable to urinate at present and has no urgency to urinate for sample

## 2020-02-01 NOTE — Interval H&P Note (Signed)
History and Physical Interval Note:  02/01/2020 5:24 PM  Sherry [name]  has presented today for surgery, with the diagnosis of aortic stenosis and cardiogenic shock.  The various methods of treatment have been discussed with the patient and family. After consideration of risks, benefits and other options for treatment, the patient has consented to  Procedure(s): RIGHT HEART CATH (N/A) and left heart cath as a surgical intervention.  The patient's history has been reviewed, patient examined, no change in status, stable for surgery.  I have reviewed the patient's chart and labs.  Questions were answered to the patient's satisfaction.     Vartan Kerins

## 2020-02-01 NOTE — Progress Notes (Signed)

## 2020-02-01 NOTE — ED Notes (Signed)
Lunch Tray Ordered @ 1048. °

## 2020-02-01 NOTE — H&P (View-Only) (Signed)
Paged regarding hypotension x2 on both arms, approximately ~ 72/50. Patient was evaluated at bedside. She denies any acute complaints, including chest pain, SOB, dizziness.   On evaluation, patient alert and oriented, no acute neurological deficits, appears at baseline compared to examination earlier in the morning. 2+ peripheral pulses. No respiratory distress, maintain 100% oxygen saturation.   Will plan for bolus trial and re-check BP. Patient does have findings consistent with UTI but no evidence of systemic infection to suspect septic shock. I am concerned this is an indication of worsening aortic stenosis with known hx of severe D3 aortic stenosis. Cardiology consulted placed.   Dr. Jose Persia Internal Medicine PGY-2  Pager: [phone] After 5pm on weekdays and 1pm on weekends: On Call pager [phone]  02/01/2020, 12:02 PM

## 2020-02-01 NOTE — ED Notes (Signed)
Called lab to add on Procalcitonin.

## 2020-02-01 NOTE — Progress Notes (Addendum)
Subjective: HD#1 No acute overnight events. Sherry [name] evaluated at bedside this AM. She states she felt SOB with cough, almost like her throat was swelling as air would not move down. Those symptoms resolved now. Denies fever, chills, diarrhea, orthopnea. She notes chronic constipation. Last BM was yesterday, two episodes, normal. She had some abdominal pain last night but it has resolved now. She denies any nausea or vomiting now. She endorses dysuria. She states she was eating and drinking as best as she can recently.   Objective:  Vital signs in last 24 hours: Vitals:   02/01/20 1255 02/01/20 1300 02/01/20 1330 02/01/20 1400  BP: (!) 98/39 (!) 88/44 (!) 109/44 (!) 101/55  Pulse: 71  69 70  Resp: 19 14 12 18   Temp:      TempSrc:      SpO2: 94%  100% 100%  Weight:      Height:       CBC Latest Ref Rng & Units 02/01/2020 02/01/2020 01/31/2020  WBC 4.0 - 10.5 K/uL 9.3 11.1(H) 3.4(L)  Hemoglobin 12.0 - 15.0 g/dL 10.7(L) 10.7(L) 12.1  Hematocrit 36.0 - 46.0 % 31.5(L) 33.9(L) 38.0  Platelets 150 - 400 K/uL 400 366 351   Physical exam:  General: Well developed, chronically ill elderly female lying comfortably in bed, NAD HEENT: MMM CV: Bradycardia, Systolic murmur at Rt USB Pulmonary: No chest wall tenderness, CTAB Abdomianl: Soft, no rigidity, no guarding, no abdominal tenderness. MSK: Trace pedal edema Neuro: AAO*3 Psych: Normal mood and affect. Normal thought content, normal judgement   Assessment/Plan: Sherry [name] is a 74 yo female with history of HTN, HLD, CAD, HFpEF, aortic stenosis, Mobitz type 1 AV block, chronic bradycardia, CKD stage III, and seizure disorder presented to the ED for [facility] and cough, admitted for an AKI.  Active Problems:   Chronic sinus bradycardia   Severe aortic stenosis   AKI (acute kidney injury) ([place])  # Hypotension  Patient usually hypertensive and develop new hypotension, possibly due to over diuresis  2/2 to recent increased lasix dose  in the setting of severe Aortic stenosis. On evaluation alert, oriented and awake, and appears at baseline. Had mild anion gap acidosis, checking lactic acid.  - Received IVF 1500 cc boluses thus far  -F/U Echo -F/U lactic Acid -Hold home Lisinopril, Amlodipine -Hold Lasix   #AKI on CKD3 SCr~ 1.88 this morning, trending down from 2.11 on admission.BUN 79 and GFR 28. Patient's AKI possibly due to dehydration due to decreased PO intake and in the setting of recently increased lasix dose, improving after IV fluids . ATN also on the differential with hypotension.   - Received IVF 1500 cc thus far - Strict I & O - avoid nephrotoxic medications - Holding lasix at this time    # Hx of severe aortic stenosis #HFpEF w/severe LVH  Presented with symptoms of SOB. Patient has severe, stage D3 aortic stenosis. Advised by cardio few months ago about permanent pacing and TAVR and asked to follow up if symptoms develop.  In addition, pt. May have cardiac amyloid, although tested negative for transthyretin amyloid, but MRI suggests amyloidosis. She was refereed to advanced heart failure but did not follow the consultation. Patient presented to the ED on 01/20/20 with b/l LE swelling, and her lasix was increased to 60mg  daily.   - holding lasix and home antihypertensives  - cards consulted   # Chronic Bradycardia #Chronic 1st degree AV block, remote h/o Mobitz Type 1(2014)  - Telemetry  -  Cards consulted  #Possible UTI  Patient c/o burning and itching during urination. Urinalysis is not impressive,  small leukocytes with bacteria. Given symptoms will treat empirically and follow up urine culture.   - IV Ceftriaxone 1g  #Seizure Disorder Stable. First began in 1998, with some recurrence but chart review shows last seizure around 2015. Patient herself reports that she has not had a seizure in a while now. On phenobarbitol 129.6mg  qhs and zonegran 300mg  qhs. - continue home phenobarbitol -  decreased dose of zonegran to 200mg  qhs in the setting of AKI (can increase to home dose once AKI is resolved)   Prior to Admission Living Arrangement:Home Anticipated Discharge Location:Home Barriers to Discharge: ongoing medical management Dispo: Anticipated discharge in approximately 1-2 day(s).   Honor Junes, MD 02/01/2020, 2:42 PM Pager: [phone] After 5pm on weekdays and 1pm on weekends: On Call pager [phone]

## 2020-02-01 NOTE — Progress Notes (Signed)
  Echocardiogram 2D Echocardiogram limited has been performed.  Darlina Sicilian M 02/01/2020, 2:17 PM

## 2020-02-01 NOTE — Progress Notes (Unsigned)
New Patient Office Visit  Subjective:  Patient ID: Sherry [name], female    DOB: Jan 20, 1946  Age: 74 y.o. MRN: [id number]  CC: No chief complaint on file.   HPI Sherry [name] presents for ***  Past Medical History:  Diagnosis Date  . Anemia   . Aortic stenosis   . Arthritis   . Bilateral lower extremity edema   . Bradycardia    a. 01/2013 - asymptomatic.  [name] Chronic diastolic heart failure ([place])   . CKD (chronic kidney disease)    a. baseline CKD stage III  . Coronary artery disease   . GERD (gastroesophageal reflux disease)   . Headache(784.0)   . Heart block AV second degree 02/15/2013  . Heart murmur   . Hyperlipidemia   . Hypertension   . Mobitz (type) I (Wenckebach's) atrioventricular block    a. 01/2013 - asymptomatic.  . Morbid obesity ([place])   . Seizure disorder ([place])   . Seizures ([place])   . TIA (transient ischemic attack) 2014   pt stated she had "mini strokes"    Past Surgical History:  Procedure Laterality Date  . ABDOMINAL HYSTERECTOMY    . LEFT AND RIGHT HEART CATHETERIZATION WITH CORONARY ANGIOGRAM N/A 05/24/2013   Procedure: LEFT AND RIGHT HEART CATHETERIZATION WITH CORONARY ANGIOGRAM;  Surgeon: Sinclair Grooms, MD;  Location: [facility] CATH LAB;  Service: Cardiovascular;  Laterality: N/A;    Family History  Problem Relation Age of Onset  . Hypertension Other     Social History   Socioeconomic History  . Marital status: Married    Spouse name: Not on file  . Number of children: 2  . Years of education: 4  . Highest education level: Not on file  Occupational History  . Occupation: retired  Tobacco Use  . Smoking status: Former Smoker    Types: Cigarettes    Quit date: 06/03/2000    Years since quitting: 19.6  . Smokeless tobacco: Never Used  Vaping Use  . Vaping Use: Never used  Substance and Sexual Activity  . Alcohol use: No    Alcohol/week: 0.0 standard drinks  . Drug use: No  . Sexual activity: Not Currently    Partners: Male   Other Topics Concern  . Not on file  Social History Narrative   Right handed   One story home   Drinks no caffeine    Social Determinants of Health   Financial Resource Strain: Not on file  Food Insecurity: Not on file  Transportation Needs: Not on file  Physical Activity: Not on file  Stress: Not on file  Social Connections: Not on file  Intimate Partner Violence: Not on file    ROS Review of Systems  Objective:   Today's Vitals: There were no vitals taken for this visit.  Physical Exam  Assessment & Plan:   Problem List Items Addressed This Visit   None     Facility-Administered Encounter Medications as of 02/01/2020  Medication  . acetaminophen (TYLENOL) tablet 650 mg   Or  . acetaminophen (TYLENOL) suppository 650 mg  . aspirin EC tablet 81 mg  . enoxaparin (LOVENOX) injection 30 mg  . lidocaine (LIDODERM) 5 % 1 patch  . PHENobarbital (LUMINAL) tablet 129.6 mg  . zonisamide (ZONEGRAN) capsule 200 mg   Outpatient Encounter Medications as of 02/01/2020  Medication Sig  . alendronate (FOSAMAX) 70 MG tablet TAKE HALF A TABLET BY MOUTH ONCE A WEEK (Patient taking differently: Take 70 mg by mouth once a  week.)  . amLODipine (NORVASC) 10 MG tablet TAKE ONE TABLET BY MOUTH DAILY (Patient taking differently: Take 10 mg by mouth daily.)  . aspirin 81 MG EC tablet Take 81 mg by mouth daily.  . Calcium Carb-Cholecalciferol (CALCIUM 500 +D) 500-400 MG-UNIT TABS Take 1 tablet by mouth 2 (two) times daily.  . furosemide (LASIX) 40 MG tablet Take 1 tablet (40 mg total) by mouth daily.  [name] lisinopril (ZESTRIL) 40 MG tablet Take 1 tablet (40 mg total) by mouth daily.  [name] PHENobarbital (LUMINAL) 64.8 MG tablet Take 2 tablets (129.6 mg total) by mouth daily. (Patient taking differently: Take 129.6 mg by mouth at bedtime.)  . pravastatin (PRAVACHOL) 40 MG tablet TAKE ONE TABLET BY MOUTH AT BEDTIME (Patient taking differently: Take 40 mg by mouth at bedtime.)  . predniSONE  (DELTASONE) 20 MG tablet Take 2 tablets (40 mg total) by mouth daily with breakfast.  . zonisamide (ZONEGRAN) 100 MG capsule Take 3 capsules every night (Patient taking differently: Take 300 mg by mouth at bedtime.)    Follow-up: No follow-ups on file.   Avie Echevaria

## 2020-02-01 NOTE — ED Notes (Signed)
Per NT [facility], patient had voided in the bed and unfortunately the [name] was on sideways at the time so it did not catch the urine. Patient was cleansed and [name] was reapplied properly.

## 2020-02-01 NOTE — Consult Note (Addendum)
Cardiology Consultation:   Patient ID: Sherry [name] MRN: [id number]; DOB: [birth year]  Admit date: 01/31/2020 Date of Consult: 02/01/2020  Primary Care Provider: Marty Heck, DO [place] Cardiologist: Sherren Mocha, MD  [place] Electrophysiologist:  None    Patient Profile:   Sherry [name] is a 74 y.o. female with a hx of bradycardia, severe AS, CAD, CKD stage 3, HFpEF, HLD, HTN who is being seen today for the evaluation of CHF at the request of Dr. Karel Jarvis.  History of Present Illness:   [name] is followed by Dr. Burt Knack for the above cardiac issues. She has a history of severe AS. IT is stage 3 low flow low gradient AS. She also has concentric LVH with a small LV. She has marked limited functional capacity. Historically patient has been asymptomatic and has preferred ongoing observation. She had prior testing for cardiac amyloid. She tested negative for transthyretin amyloid but her cardiac MRI was suggestive of amyloidosis. She was referred to the [facility] team but did not follow through. She also has a history of transient heart block. A 3 day Zio patch monitor was ordered at her last visit 10/11/19 which showed no high grade AV block.   The patient presented to the ER for shortness of breath and cough. Yesterday the patient was on the commode and her husband helped her clean up. Afterwards she felt significantly sob and weak. She felt her throat swell and close up. She had nausea and vomiting. Breathing was very labored. No chest pain. The husband called EMS who reported hypotension with systolics 56-21H. They placed her on supplemental O2 and brought her to the ED. Apparently the patient was in the ED 12/2 and lasix was increased for b/l swelling form 40 to 60mg  daily. On 12/9 she saw her PCP who decreased it back down to 40mg  daily.   In the ED the patient was found to be hypotensive. EKG showed SB with 1st degree AV block. Labs showed sodium 132, HCO3 20,  AG 16, glucose 157, Cr 2.11, BUN 80. HS trop 32>35. BNP 167. Lactic acid 1.3. Respiratory panel negative. CT head unremarkable. CXR showed stable cardiomegaly. She was given IVF and admitted for further work-up.    Past Medical History:  Diagnosis Date  . Anemia   . Aortic stenosis   . Arthritis   . Bilateral lower extremity edema   . Bradycardia    a. 01/2013 - asymptomatic.  [name] Chronic diastolic heart failure ([place])   . CKD (chronic kidney disease)    a. baseline CKD stage III  . Coronary artery disease   . GERD (gastroesophageal reflux disease)   . Headache(784.0)   . Heart block AV second degree 02/15/2013  . Heart murmur   . Hyperlipidemia   . Hypertension   . Mobitz (type) I (Wenckebach's) atrioventricular block    a. 01/2013 - asymptomatic.  . Morbid obesity ([place])   . Seizure disorder ([place])   . Seizures ([place])   . TIA (transient ischemic attack) 2014   pt stated she had "mini strokes"    Past Surgical History:  Procedure Laterality Date  . ABDOMINAL HYSTERECTOMY    . LEFT AND RIGHT HEART CATHETERIZATION WITH CORONARY ANGIOGRAM N/A 05/24/2013   Procedure: LEFT AND RIGHT HEART CATHETERIZATION WITH CORONARY ANGIOGRAM;  Surgeon: Sinclair Grooms, MD;  Location: [facility] CATH LAB;  Service: Cardiovascular;  Laterality: N/A;     Home Medications:  Prior to Admission medications   Medication Sig Start Date End  Date Taking? Authorizing Provider  alendronate (FOSAMAX) 70 MG tablet TAKE HALF A TABLET BY MOUTH ONCE A WEEK Patient taking differently: Take 70 mg by mouth once a week. 01/21/20  Yes Seawell, Jaimie A, DO  amLODipine (NORVASC) 10 MG tablet TAKE ONE TABLET BY MOUTH DAILY Patient taking differently: Take 10 mg by mouth daily. 09/07/19  Yes Marianna Payment, MD  aspirin 81 MG EC tablet Take 81 mg by mouth daily.   Yes [provider]  Calcium Carb-Cholecalciferol (CALCIUM 500 +D) 500-400 MG-UNIT TABS Take 1 tablet by mouth 2 (two) times daily. 10/25/15  Yes Mignon Pine, DO  furosemide (LASIX) 40 MG tablet Take 1 tablet (40 mg total) by mouth daily. 10/27/19  Yes Seawell, Jaimie A, DO  lisinopril (ZESTRIL) 40 MG tablet Take 1 tablet (40 mg total) by mouth daily. 10/27/19  Yes Seawell, Jaimie A, DO  PHENobarbital (LUMINAL) 64.8 MG tablet Take 2 tablets (129.6 mg total) by mouth daily. Patient taking differently: Take 129.6 mg by mouth at bedtime. 10/20/19  Yes Cameron Sprang, MD  pravastatin (PRAVACHOL) 40 MG tablet TAKE ONE TABLET BY MOUTH AT BEDTIME Patient taking differently: Take 40 mg by mouth at bedtime. 10/22/19  Yes Cato Mulligan, MD  predniSONE (DELTASONE) 20 MG tablet Take 2 tablets (40 mg total) by mouth daily with breakfast. 01/28/20  Yes Seawell, Jaimie A, DO  zonisamide (ZONEGRAN) 100 MG capsule Take 3 capsules every night Patient taking differently: Take 300 mg by mouth at bedtime. 10/20/19  Yes Cameron Sprang, MD    Inpatient Medications: Scheduled Meds: . aspirin EC  81 mg Oral Daily  . enoxaparin (LOVENOX) injection  30 mg Subcutaneous Q24H  . lidocaine  1 patch Transdermal Q24H  . PHENobarbital  129.6 mg Oral QHS  . zonisamide  200 mg Oral QHS   Continuous Infusions: . lactated ringers     PRN Meds: acetaminophen **OR** acetaminophen  Allergies:    Allergies  Allergen Reactions  . Ibuprofen Other (See Comments)    Dr advised pt not to take ibuprofen    Social History:   Social History   Socioeconomic History  . Marital status: Married    Spouse name: Not on file  . Number of children: 2  . Years of education: 4  . Highest education level: Not on file  Occupational History  . Occupation: retired  Tobacco Use  . Smoking status: Former Smoker    Types: Cigarettes    Quit date: 06/03/2000    Years since quitting: 19.6  . Smokeless tobacco: Never Used  Vaping Use  . Vaping Use: Never used  Substance and Sexual Activity  . Alcohol use: No    Alcohol/week: 0.0 standard drinks  . Drug use: No  . Sexual activity: Not  Currently    Partners: Male  Other Topics Concern  . Not on file  Social History Narrative   Right handed   One story home   Drinks no caffeine    Social Determinants of Health   Financial Resource Strain: Not on file  Food Insecurity: Not on file  Transportation Needs: Not on file  Physical Activity: Not on file  Stress: Not on file  Social Connections: Not on file  Intimate Partner Violence: Not on file    Family History:    Family History  Problem Relation Age of Onset  . Hypertension Other      ROS:  Please see the history of present illness.   All other  ROS reviewed and negative.     Physical Exam/Data:   Vitals:   02/01/20 1158 02/01/20 1200 02/01/20 1230 02/01/20 1255  BP: (!) 94/47 (!) 91/47 (!) 86/41 (!) 98/39  Pulse: (!) 58   71  Resp: 12 12 14 19   Temp:      TempSrc:      SpO2: 100%   94%  Weight:      Height:        Intake/Output Summary (Last 24 hours) at 02/01/2020 1326 Last data filed at 02/01/2020 1239 Gross per 24 hour  Intake 1250 ml  Output --  Net 1250 ml   Last 3 Weights 01/31/2020 01/19/2020 10/27/2019  Weight (lbs) 137 lb 9.1 oz 137 lb 8 oz 141 lb 1.6 oz  Weight (kg) 62.4 kg 62.37 kg 64.003 kg     Body mass index is 25.99 kg/m.  General:  Well nourished, well developed, in no acute distress HEENT: normal Lymph: no adenopathy Neck: no JVD Endocrine:  No thryomegaly Vascular: No carotid bruits; FA pulses 2+ bilaterally without bruits  Cardiac:  normal S1, S2; RRR;+ murmur  Lungs:  clear to auscultation bilaterally, no wheezing, rhonchi or rales  Abd: soft, nontender, no hepatomegaly  Ext: no edema Musculoskeletal:  No deformities, BUE and BLE strength normal and equal Skin: warm and dry  Neuro:  CNs 2-12 intact, no focal abnormalities noted Psych:  Normal affect   EKG:  The EKG was personally reviewed and demonstrates:  SB, first degree AV block, TWI lateral leads, nonspecific ST changes anterior leads Telemetry:  Telemetry  was personally reviewed and demonstrates:  SR, 1st degree AV block, HR 60s  Relevant CV Studies:  Echo 09/2019  1. Left ventricular ejection fraction, by estimation, is 65 to 70%. The  left ventricle has normal function. The left ventricle has no regional  wall motion abnormalities. There is severe concentric left ventricular  hypertrophy. Left ventricular diastolic  parameters are consistent with Grade II diastolic dysfunction  (pseudonormalization). Elevated left ventricular end-diastolic pressure.  2. Right ventricular systolic function is normal. The right ventricular  size is normal. Moderately increased right ventricular wall thickness.  Tricuspid regurgitation signal is inadequate for assessing PA pressure.  3. Left atrial size was moderately dilated.  4. Right atrial size was mildly dilated.  5. The mitral valve is abnormal. Mild mitral valve regurgitation. No  evidence of mitral stenosis.  6. The aortic valve is abnormal. Aortic valve regurgitation is not  visualized. Moderate to severe aortic valve stenosis.  7. The inferior vena cava is normal in size with greater than 50%  respiratory variability, suggesting right atrial pressure of 3 mmHg.   Comparison(s): No significant change from prior study.   Heart monitor 10/2019 Study Highlights  1. The basic rhythm is normal sinus with an average HR of 51 bpm with first degree AV block (no second or third degree AV block present) 2. No atrial fibrillation or flutter 3. No high-grade heart block or pathologic pauses 4. There are rare PVC's and no supraventricular beats  5. There is a 16 beat run of NSVT  Cardiac MRI 12/2017 IMPRESSION: 1. This study is suggestive of cardiac amyloidosis. There is a moderate pericardial effusion (without tamponade), moderate concentric LV hypertrophy, and on delayed enhancement imaging, the myocardium is difficult to null with a suggestion of diffuse subendocardial LGE.  2. There is  aortic stenosis present, the degree would be better assessed by echo.  Air traffic controller  Laboratory Data:  High Sensitivity  Troponin:   Recent Labs  Lab 01/31/20 1528 01/31/20 1728  TROPONINIHS 32* 35*     Chemistry Recent Labs  Lab 02/01/20 0227 02/01/20 0744 02/01/20 0915  NA 129* SPECIMEN HEMOLYZED. HEMOLYSIS MAY AFFECT INTEGRITY OF RESULTS. 131*  K 4.7 SPECIMEN HEMOLYZED. HEMOLYSIS MAY AFFECT INTEGRITY OF RESULTS. 4.2  CL 99 SPECIMEN HEMOLYZED. HEMOLYSIS MAY AFFECT INTEGRITY OF RESULTS. 98  CO2 16* SPECIMEN HEMOLYZED. HEMOLYSIS MAY AFFECT INTEGRITY OF RESULTS. 17*  GLUCOSE 157* SPECIMEN HEMOLYZED. HEMOLYSIS MAY AFFECT INTEGRITY OF RESULTS. 148*  BUN 79* SPECIMEN HEMOLYZED. HEMOLYSIS MAY AFFECT INTEGRITY OF RESULTS. 79*  CREATININE 1.91* SPECIMEN HEMOLYZED. HEMOLYSIS MAY AFFECT INTEGRITY OF RESULTS. 1.88*  CALCIUM 8.3* SPECIMEN HEMOLYZED. HEMOLYSIS MAY AFFECT INTEGRITY OF RESULTS. 8.5*  GFRNONAA 27* SPECIMEN HEMOLYZED. HEMOLYSIS MAY AFFECT INTEGRITY OF RESULTS. [place]. HEMOLYSIS MAY AFFECT INTEGRITY OF RESULTS.  --   ANIONGAP 14 SPECIMEN HEMOLYZED. HEMOLYSIS MAY AFFECT INTEGRITY OF RESULTS. 16*    Recent Labs  Lab 02/01/20 0227  PROT 6.4*  ALBUMIN 2.6*  AST 39  ALT 42  ALKPHOS 67  BILITOT 0.5   Hematology Recent Labs  Lab 01/31/20 1528 02/01/20 0227 02/01/20 0744  WBC 3.4* 11.1* 9.3  RBC 4.12 3.67* 3.50*  HGB 12.1 10.7* 10.7*  HCT 38.0 33.9* 31.5*  MCV 92.2 92.4 90.0  MCH 29.4 29.2 30.6  MCHC 31.8 31.6 34.0  RDW 11.8 11.8 12.2  PLT 351 366 400   BNP Recent Labs  Lab 01/31/20 1528  BNP 167.9*    DDimer No results for input(s): DDIMER in the last 168 hours.   Radiology/Studies:  CT Head Wo Contrast  Result Date: 01/31/2020 CLINICAL DATA:  Mental status change, unknown cause. Additional history provided: Shortness of breath, vomiting. EXAM: CT HEAD WITHOUT CONTRAST TECHNIQUE: Contiguous axial images were obtained from  the base of the skull through the vertex without intravenous contrast. COMPARISON:  Brain MRI 11/01/2006.  Head CT 01/23/2004. FINDINGS: Brain: Mild cerebral and cerebellar atrophy. Redemonstrated chronic small-vessel infarcts within the left corona radiata/frontal white matter. Prominent perivascular space within the left subinsular region. Background moderate ill-defined hypoattenuation within the cerebral white matter which is nonspecific, but compatible chronic small vessel ischemic disease. There is no acute infarct. No evidence of intracranial mass. No chronic intracranial blood products. No extra-axial fluid collection. No midline shift. Vascular: No hyperdense vessel.  Atherosclerotic calcifications. Skull: Normal. Negative for fracture or focal lesion. Sinuses/Orbits: Visualized orbits show no acute finding. No significant paranasal sinus disease. IMPRESSION: No evidence of acute intracranial abnormality. Redemonstrated chronic small-vessel infarcts within the left cerebral white matter. Background moderate cerebral white matter chronic small vessel ischemic disease. Mild parenchymal atrophy. Electronically Signed   By: Kellie Simmering DO   On: 01/31/2020 17:13   DG Chest Port 1 View  Result Date: 01/31/2020 CLINICAL DATA:  Shortness of breath. EXAM: PORTABLE CHEST 1 VIEW COMPARISON:  01/06/2019 FINDINGS: Chronic cardiomegaly not significantly changed from prior exam. Aortic atherosclerosis. There is no pulmonary edema. No pleural fluid or pneumothorax. No focal airspace disease. Degenerative change of both shoulders. IMPRESSION: Stable chronic cardiomegaly. No acute chest findings. Electronically Signed   By: Keith Rake M.D.   On: 01/31/2020 16:14     Assessment and Plan:   Low flow low gradient AS Chronic diastolic CHF  Shock, likely hypovolemic Pericardial effusion - PTA lasix 40 mg daily. She was seen in the ED 01/20/20 with b/l swelling and lasix was increased to 60mg  daily for 1  week  and then back to down 40mg  01/27/20 by her PCP - known history of severe AS. Historically has been essentially asymptomatic. Need to avoid hypotension and hypovolemia. Agree with second IV bolus. Need to watch for signs of cardiogenic shock - Also has possible amyloidosis by cardiac MRI although PYP was not suggestive of this.  Will need AL amyloid eval, will check light chains, SPEP, UPEP - BNP 167 on arrival. CXR with no acute findings - Has been given IVF for AKI and hypotension - Echo 09/2019 showed LVEF 65-70%, G2DD, severe LVH, mod to severe AS - Lactic acid went up. Might need ICU admission with central line to guide volume management if lactic acid continues to increase or she continues to be hypotensive, see MD note below  AKI on CKD stage 3 - On admission creatinine 2.11, BUN 80. Baseline is around 1.3. - Suspected pre-renal from recently increased lasix dose.  - lasix and ACE held, receiving IV fluids - creatinine improving, 1.88 today - daily BMET  Hypotension with history of HTN - home amlodipine and lisinopril held for hypotension - BP improved with IVF, most recent 101/55 - continue to hold antihypertensives  Bradycardia - no high degree AV block on heart monitor 09/2019 - not on rate lowering agents  Per IM - seizure disorder - UTI  For questions or updates, please contact [facility] Please consult [email or web address] for contact info under    Signed, Cadence Ninfa Meeker, PA-C  02/01/2020 1:26 PM   Patient seen and examined.  Agree with above documentation.  Ms. Mctigue is a 74 year old female with a history of severe aortic stenosis, CKD stage III, chronic diastolic heart failure, CAD, hypertension who is we are consulted to see him by Dr.Guilloud for evaluation of hypotension.  She has been noted to have severe LVH and previously underwent a cardiac MRI that was concerning for amyloidosis.  Subsequently underwent PYP scan which was negative.  She also has had a history  of transient heart block.  Zio patch was ordered at last clinic visit in August 2021 which showed no high degree AV block.  She was recently in the ED on 12/2 with lower extremity edema and her Lasix was increased to 60 mg daily.  A follow-up with her PCP on 12/9, her Lasix was decreased to 40 mg daily.  She presented to the ED yesterday with shortness of breath and weakness.  BP as low as 72/50 in the ED, has recevied ~2L IV fluids with improvement but continues to have hypotension.  SPO2 100% on room air.  EKG shows sinus rhythm with first-degree AV block, rate 53.  Labs on admission notable for creatinine 2.1 (up from 1.5 on 12/9), BNP 168 troponin 32 > 35, WBC 3.4, hemoglobin 12.1.  Lactate was initially 1.3 but subsequently rose to 2.5.  Procalcitonin 0.27.  On exam, patient is alert and oriented, regular rate and rhythm, 3/6 systolic murmur, lungs CTAB, no LE edema.  Suspect her presentation represents hypovolemic shock in setting of overdiuresis.  Echo personally reviewed, shows moderate effusion, mixed picture for tamponade as IVC is collapsible and no significant mitral inflow respiratory diuresis, but does appear to be RV diastolic collapse in some images.  Reviewed images with interventional cardiology, recommended obtaining more hemodynamic information.  Discussed with Dr Haroldine Laws, will place Swan in cath lab for hemodynamic guidance in setting of her hypovolemia, severe AS, and moderate pericardial effusion.  Suspect will need more aggressive IV fluid resuscitation, will follow up  RHC results.  CRITICAL CARE TIME: I have spent a total of 45 minutes with patient reviewing hospital notes, telemetry, EKGs, labs and examining the patient as well as establishing an assessment and plan that was discussed with the patient.  > 50% of time was spent in direct patient care. The patient is critically ill with multi-organ system failure and requires high complexity decision making for assessment and support,  frequent evaluation and titration of therapies, application of advanced monitoring technologies and extensive interpretation of multiple databases.  Donato Heinz, MD

## 2020-02-02 ENCOUNTER — Encounter (HOSPITAL_COMMUNITY): Payer: Self-pay | Admitting: Internal Medicine

## 2020-02-02 DIAGNOSIS — R571 Hypovolemic shock: Secondary | ICD-10-CM

## 2020-02-02 LAB — CBC
HCT: 31.4 % — ABNORMAL LOW (ref 36.0–46.0)
Hemoglobin: 10.1 g/dL — ABNORMAL LOW (ref 12.0–15.0)
MCH: 29.4 pg (ref 26.0–34.0)
MCHC: 32.2 g/dL (ref 30.0–36.0)
MCV: 91.3 fL (ref 80.0–100.0)
Platelets: 335 10*3/uL (ref 150–400)
RBC: 3.44 MIL/uL — ABNORMAL LOW (ref 3.87–5.11)
RDW: 12.2 % (ref 11.5–15.5)
WBC: 5.3 10*3/uL (ref 4.0–10.5)
nRBC: 0 % (ref 0.0–0.2)

## 2020-02-02 LAB — MAGNESIUM: Magnesium: 2.5 mg/dL — ABNORMAL HIGH (ref 1.7–2.4)

## 2020-02-02 LAB — BASIC METABOLIC PANEL
Anion gap: 12 (ref 5–15)
BUN: 70 mg/dL — ABNORMAL HIGH (ref 8–23)
CO2: 18 mmol/L — ABNORMAL LOW (ref 22–32)
Calcium: 7.6 mg/dL — ABNORMAL LOW (ref 8.9–10.3)
Chloride: 101 mmol/L (ref 98–111)
Creatinine, Ser: 1.65 mg/dL — ABNORMAL HIGH (ref 0.44–1.00)
GFR, Estimated: 33 mL/min — ABNORMAL LOW (ref >60)
Glucose, Bld: 123 mg/dL — ABNORMAL HIGH (ref 70–99)
Potassium: 4.6 mmol/L (ref 3.5–5.1)
Sodium: 131 mmol/L — ABNORMAL LOW (ref 135–145)

## 2020-02-02 LAB — UREA NITROGEN, URINE: Urea Nitrogen, Ur: 574 mg/dL

## 2020-02-02 LAB — URIC ACID: Uric Acid, Serum: 8 mg/dL — ABNORMAL HIGH (ref 2.5–7.1)

## 2020-02-02 MED ORDER — ROSUVASTATIN CALCIUM 5 MG PO TABS
10.0000 mg | ORAL_TABLET | Freq: Every day | ORAL | Status: DC
  Administered 2020-02-02 – 2020-02-03 (×2): 10 mg via ORAL
  Filled 2020-02-02 (×2): qty 2

## 2020-02-02 MED ORDER — ENSURE ENLIVE PO LIQD
237.0000 mL | Freq: Two times a day (BID) | ORAL | Status: DC
  Administered 2020-02-02 – 2020-02-03 (×3): 237 mL via ORAL

## 2020-02-02 NOTE — Plan of Care (Signed)
  Problem: Education: Goal: Knowledge of General Education information will improve Description Including pain rating scale, medication(s)/side effects and non-pharmacologic comfort measures Outcome: Progressing   Problem: Clinical Measurements: Goal: Diagnostic test results will improve Outcome: Progressing Goal: Respiratory complications will improve Outcome: Progressing   

## 2020-02-02 NOTE — Progress Notes (Signed)
Internal Medicine Attending Note:  Patient is doing well today, reports significant improvement in her symptoms. SOB resolved.   Blood pressure (!) 120/55, pulse 71, temperature 99.5 F (37.5 C), resp. rate 17, height 5\' 1"  (1.549 m), weight 67.8 kg, SpO2 100 %. Physical Exam Constitutional: NAD, appears comfortable Cardiovascular: RRR, 3/6 AS murmur  Pulmonary/Chest: CTAB, normal effort Abdominal: Soft, non tender, non distended. +BS.  Extremities: Warm and well perfused.  No edema.   Patient is a pleasant 73 yo F with a pmhx of HTN, HLD, LVH, multivessel CAD, chronic bradycardia, chronic 1st degree AV block, remote history of Mobitz type 1, HFpEF, possible infiltrative cardiomyopathy admitted for hypovolemic shock in the setting of overdiuresis, improved today after IVF resuscitation.   Plan:   Hypovolemic Shock: Due to over diuresis in the setting of aortic stenosis and likely infiltrative cardiomyopathy. Greatly appreciate cardiology and HF team's assistance. There was concern for cardiogenic shock yesterday due to persistent hypotension despite IVFs and increasing lactic acid. Echo showed a moderate pericardial effusion and there was question of tamponade physiology. However RHC looked reassuring. CI was 2.5 and PCWP low at 5.  Blood pressure has improved after fluids but CVP remains low at 4. Plan to remove swan today and transfer out of the ICU. Continue holding diuretics. Push oral hydration.   AKI on CKD IIIb: Pre renal in the setting of hypovolemia, improved with IVFs. Continue to monitor.   UTI: Symptoms + urine cultures growing gram negative rods. On IV ceftriaxone. Follow up sensitives.   HFpEF, severe LVH,  ?Cardiac Amyloidosis  Multivessel CAD Moderate AS Chronic Bradycardia, 1st Degree AV block Remote history of Mobitz type I (2014) Continue telemetry, ASA 81 daily. Holding lasix. SPEP and free light chains are pending. HF suggest repeating PYP scan - will defer this to  cards.   HTN Holding home lisinopril and amlodipine   Normocytic Anemia New this admission. No clinical evidence of bleeding. Checking iron studies.  Last colonoscopy was in 2008, report not available. Will need outpatient follow up and GI referral for repeat screening colonoscopy.   Velna Ochs, MD 02/02/2020, 1:39 PM

## 2020-02-02 NOTE — Progress Notes (Signed)
Initial Nutrition Assessment  DOCUMENTATION CODES:   Non-severe (moderate) malnutrition in context of chronic illness  INTERVENTION:   Ensure Enlive po BID, each supplement provides 350 kcal and 20 grams of protein  Provided written and verbal education on Low Sodium Nutrition Therapy to patient and her husband   NUTRITION DIAGNOSIS:   Moderate Malnutrition related to chronic illness as evidenced by mild fat depletion,mild muscle depletion.  GOAL:   Patient will meet greater than or equal to 90% of their needs  MONITOR:   PO intake,Supplement acceptance,Labs,Weight trends  REASON FOR ASSESSMENT:   Consult Assessment of nutrition requirement/status  ASSESSMENT:   74 yo female admitted with SOB, cardiogenic shock, AKI on CKD.  PMH includes severe AS, CAD, CKD III/IV, CHF, HLD, HTN  ECHO EF 55-60%, RV normal, severe aortic stenosis  Pt reports good appetite at present; ate 100% at breakfast. Pt reports she does not eat as well at home, does not use oral nutrition supplements.  No wt loss per weight encounters.   Pt reports she has not used the salt shaker in years; reports her daughter recently went grocery shopping for her and bought a bunch of low sodium items. Reviewed low sodium diet and included ways to reduced sodium in current diet  Pt verbalizes she has been chair/bed bound for years; she has not walked due to pain in her feet. Pt reports she uses a "hoverround" to get around and her husband "picks her up" to transfer there to the chair. Immobility likely explains the severity of muscle wasting in LE.   Noted MD checking uric acid levels for gout and myeloma panel is pending  Pt on Renal Diet/Carb Modified with Fluid restriction this AM which was liberalized to 2g sodium. Pt does not require a renal diet OR a carb modified diet based on her medical hx and current lab values  Labs: sodium 131 (L), potassium 4.6 (wdl) Meds: reviewed  NUTRITION - FOCUSED PHYSICAL  EXAM:  Flowsheet Row Most Recent Value  Orbital Region Mild depletion  Upper Arm Region Moderate depletion  Thoracic and Lumbar Region Mild depletion  Buccal Region Mild depletion  Temple Region Mild depletion  Clavicle Bone Region Mild depletion  Clavicle and Acromion Bone Region Mild depletion  Scapular Bone Region Mild depletion  Dorsal Hand Mild depletion  Patellar Region Severe depletion  Anterior Thigh Region Moderate depletion  Posterior Calf Region Moderate depletion       Diet Order:   Diet Order            Diet 2 gram sodium Room service appropriate? Yes; Fluid consistency: Thin  Diet effective now                 EDUCATION NEEDS:   Education needs have been addressed  Skin:  Skin Assessment: Reviewed RN Assessment  Last BM:  no documented BM  Height:   Ht Readings from Last 1 Encounters:  01/31/20 5\' 1"  (1.549 m)    Weight:   Wt Readings from Last 1 Encounters:  02/02/20 67.8 kg    BMI:  Body mass index is 28.24 kg/m.  Estimated Nutritional Needs:   Kcal:  1700-2000 kcals  Protein:  80-95 g  Fluid:  >/= 1.7 L    Kerman Passey MS, RDN, LDN, CNSC Registered Dietitian III Clinical Nutrition RD Pager and On-Call Pager Number Located in [place]

## 2020-02-02 NOTE — Progress Notes (Addendum)
Advanced Heart Failure Rounding Note  PCP-Cardiologist: Sherren Mocha, [name]   Subjective:   Sherry [name] had cath as noted above. Given IV fluids post cath.  CVP remains low.   Feels ok today. Denies SOB. Complaining of left foot pain.  PAP: (27-37)/(9-16) 27/13 CVP:  [2 mmHg-5 mmHg] 4 mmHg CO:  [3.4 L/min-4.2 L/min] 4.2 L/min CI:  [2.1 L/min/m2-2.6 L/min/m2] 2.6 L/min/m2  RHC/LHC 02/01/20  Ao = 108/56 (76) LV = 136/4 RA =  2 RV = 27/7 PA = 30/2 (13) PCW = 5 Fick cardiac output/index = 4.3/2.7 Thermo CO/CI = 4.1/2.5 PVR = 2.0 WU FA sat = 99% PA sat = 66% No RV/LV interaction AoV: mean gradient 3mHG AVA 1.47 cm2 (Fick) 1.38 cm2 (Thermo) Assessment: 1. Low filling pressures with normal output 2. Moderate aortic stenosis     Objective:   Weight Range: 67.8 kg Body mass index is 28.24 kg/m.   Vital Signs:   Temp:  [98.06 F (36.7 C)-99.32 F (37.4 C)] 99.32 F (37.4 C) (12/15 0600) Pulse Rate:  [0-144] 66 (12/15 0600) Resp:  [0-46] 16 (12/15 0600) BP: (72-149)/(39-73) 137/62 (12/15 0600) SpO2:  [0 %-100 %] 93 % (12/15 0600) Weight:  [67.8 kg] 67.8 kg (12/15 0430)    Weight change: Filed Weights   01/31/20 1535 02/02/20 0430  Weight: 62.4 kg 67.8 kg    Intake/Output:   Intake/Output Summary (Last 24 hours) at 02/02/2020 0815 Last data filed at 02/02/2020 0600 Gross per 24 hour  Intake 1892.94 ml  Output 1000 ml  Net 892.94 ml      Physical Exam   CVP 4.  General:   No resp difficulty HEENT: Normal Neck: Supple. JVP flat . Carotids 2+ bilat; no bruits. No lymphadenopathy or thyromegaly appreciated. RIJ swan.  Cor: PMI nondisplaced. Regular rate & rhythm. No rubs, gallops. 2/6 AS . Lungs: Clear Abdomen: Soft, nontender, nondistended. No hepatosplenomegaly. No bruits or masses. Good bowel sounds. Extremities: No cyanosis, clubbing, rash, edema. L foot pain.  Neuro: Alert & orientedx3, cranial nerves grossly intact. moves all 4 extremities w/o  difficulty. Affect pleasant   Telemetry   SR 70s personally reviewed.   EKG    N/A  Labs    CBC Recent Labs    02/01/20 0227 02/01/20 0744 02/01/20 1749 02/01/20 1752  WBC 11.1* 9.3  --   --   NEUTROABS 9.9* 7.8*  --   --   HGB 10.7* 10.7* 8.8* 9.5*  HCT 33.9* 31.5* 26.0* 28.0*  MCV 92.4 90.0  --   --   PLT 366 400  --   --    Basic Metabolic Panel Recent Labs    02/01/20 0744 02/01/20 0915 02/01/20 1749 02/01/20 1752  NA SPECIMEN HEMOLYZED. HEMOLYSIS MAY AFFECT INTEGRITY OF RESULTS. 131* 138 134*  K SPECIMEN HEMOLYZED. HEMOLYSIS MAY AFFECT INTEGRITY OF RESULTS. 4.2 3.9 4.5  CL SPECIMEN HEMOLYZED. HEMOLYSIS MAY AFFECT INTEGRITY OF RESULTS. 98  --   --   CO2 SPECIMEN HEMOLYZED. HEMOLYSIS MAY AFFECT INTEGRITY OF RESULTS. 17*  --   --   GLUCOSE SPECIMEN HEMOLYZED. HEMOLYSIS MAY AFFECT INTEGRITY OF RESULTS. 148*  --   --   BUN SPECIMEN HEMOLYZED. HEMOLYSIS MAY AFFECT INTEGRITY OF RESULTS. 79*  --   --   CREATININE SPECIMEN HEMOLYZED. HEMOLYSIS MAY AFFECT INTEGRITY OF RESULTS. 1.88*  --   --   CALCIUM SPECIMEN HEMOLYZED. HEMOLYSIS MAY AFFECT INTEGRITY OF RESULTS. 8.5*  --   --    Liver Function Tests  Recent Labs    02/01/20 0227  AST 39  ALT 42  ALKPHOS 67  BILITOT 0.5  PROT 6.4*  ALBUMIN 2.6*   No results for input(s): LIPASE, AMYLASE in the last 72 hours. Cardiac Enzymes No results for input(s): CKTOTAL, CKMB, CKMBINDEX, TROPONINI in the last 72 hours.  BNP: BNP (last 3 results) Recent Labs    01/19/20 1536 01/31/20 1528  BNP 268.8* 167.9*    ProBNP (last 3 results) No results for input(s): PROBNP in the last 8760 hours.   D-Dimer No results for input(s): DDIMER in the last 72 hours. Hemoglobin A1C No results for input(s): HGBA1C in the last 72 hours. Fasting Lipid Panel No results for input(s): CHOL, HDL, LDLCALC, TRIG, CHOLHDL, LDLDIRECT in the last 72 hours. Thyroid Function Tests Recent Labs    01/31/20 2148  TSH 4.577*    Other  results:   Imaging    CARDIAC CATHETERIZATION  Result Date: 02/01/2020 Findings: Ao = 108/56 (76) LV = 136/4 RA =  2 RV = 27/7 PA = 30/2 (13) PCW = 5 Fick cardiac output/index = 4.3/2.7 Thermo CO/CI = 4.1/2.5 PVR = 2.0 WU FA sat = 99% PA sat = 66% No RV/LV interaction AoV: mean gradient 80mHG AVA 1.47 cm2 (Fick) 1.38 cm2 (Thermo) Assessment: 1. Low filling pressures with normal output 2. Moderate aortic stenosis Plan/Discussion: Continue hydration. Sherry Bickers [name] 6:56 PM   ECHOCARDIOGRAM LIMITED  Result Date: 02/01/2020    ECHOCARDIOGRAM LIMITED REPORT   Patient Name:   Sherry CALICODate of Exam: 02/01/2020 Medical Rec #:  [id number]        Height:       61.0 in Accession #:    [id number]       Weight:       137.6 lb Date of Birth:  405-Jan-1947        BSA:          1.611 m Patient Age:    89years          BP:           101/55 mmHg Patient Gender: F                 HR:           70 bpm. Exam Location:  Inpatient Procedure: Limited Echo, Limited Color Doppler and Cardiac Doppler Indications:    Aortic Stenosis 424.1 / 135.0  History:        Patient has prior history of Echocardiogram examinations, most                 recent 10/07/2019. CHF, CAD; TIA. Chronic kidney disease.                 Bilateral lower extremity edema. Bradycardia. Heart block AV                 second degree. Mobitz Type I (Wenckebach) atrioventricular                 block.  Sonographer:    TDarlina SicilianRDCS Referring Phys: [id number] Ochs Sonographer Comments: Technically difficult study due to poor echo windows. IMPRESSIONS  1. Left ventricular ejection fraction, by estimation, is 55 to 60%. The left ventricle has normal function. There is severe concentric left ventricular hypertrophy. Diastology indeterminant due to severe MAC.  2. Right ventricular systolic function is normal. The right ventricular size is normal. Tricuspid regurgitation signal is inadequate for assessing  PA pressure.  3. A small,  circumferential pericaridal effusion is present. Appears to be largest around the RV measuring 1.1cm at end diastole.  4. The mitral valve is abnormal. Trivial mitral valve regurgitation. No evidence of mitral stenosis. Severe mitral annular calcification.  5. The aortic valve is calcified. There is severe calcifcation of the aortic valve. There is severe thickening of the aortic valve. Aortic valve regurgitation is not visualized. Suspect severe low flow, low gradient aortic stenosis with preserved EF. Mean gradient 44mHg, peak 547mg, vmax 3.75m64m AVA 1.2cm2 by continuty. DOI 0.28. Suspect AVA and DOI underestimate severity of AS due to difficulty obtaining coxial doppler interrogation.  6. The inferior vena cava is normal in size with greater than 50% respiratory variability, suggesting right atrial pressure of 3 mmHg.  7. Findings concerning for possible amyloidosis. Comparison(s): Compared to prior study in 09/2019, there is no significant change. FINDINGS  Left Ventricle: Left ventricular ejection fraction, by estimation, is 55 to 60%. The left ventricle has normal function. The left ventricular internal cavity size was small. There is severe concentric left ventricular hypertrophy. Diastology indeterminant due to severe MAC. Right Ventricle: The right ventricular size is normal. Right vetricular wall thickness was not well visualized. Right ventricular systolic function is normal. Tricuspid regurgitation signal is inadequate for assessing PA pressure. The tricuspid regurgitant velocity is 2.14 m/s, and with an assumed right atrial pressure of 3 mmHg, the estimated right ventricular systolic pressure is 21.31.4Hg. Pericardium: A small, circumferential pericaridal effusion is present. Appears to be largest around the RV measuring 1.1cm at end diastole. A small pericardial effusion is present. The pericardial effusion is circumferential. Mitral Valve: The mitral valve is abnormal. There is moderate thickening  of the mitral valve leaflet(s). There is moderate calcification of the mitral valve leaflet(s). Severe mitral annular calcification. Trivial mitral valve regurgitation. No significant mitral stenosis. Tricuspid Valve: The tricuspid valve is normal in structure. Tricuspid valve regurgitation is mild. Aortic Valve: There is severe calcifcation of the aortic valve. There is severe thickening of the aortic valve. Aortic valve regurgitation is not visualized. Suspect severe low flow, low gradient aortic stenosis with preserved EF. Mean gradient 44m65m peak 575mm66mvmax 3.75m/s.42mA 1.2cm2 by continuty. DOI 0.28. Suspect AVA and DOI underestimate severity of AS due to difficulty obtaining coaxial doppler interrogation. Pulmonic Valve: The pulmonic valve was grossly normal. Pulmonic valve regurgitation is not visualized. Venous: The inferior vena cava is normal in size with greater than 50% respiratory variability, suggesting right atrial pressure of 3 mmHg. LEFT VENTRICLE PLAX 2D LVIDd:         3.80 cm  Diastology LVIDs:         2.70 cm  LV e' medial:    3.12 cm/s LV PW:         0.90 cm  LV E/e' medial:  22.9 LV IVS:        1.00 cm  LV e' lateral:   2.56 cm/s LVOT diam:     2.10 cm  LV E/e' lateral: 28.0 LV SV:         76 LV SV Index:   47 LVOT Area:     3.46 cm                          3D Volume EF:                         3D EF:  54 %                         LV EDV:       108 ml                         LV ESV:       50 ml                         LV SV:        58 ml AORTIC VALVE AV Area (Vmax):    1.43 cm AV Area (Vmean):   1.48 cm AV Area (VTI):     1.85 cm AV Vmax:           337.80 cm/s AV Vmean:          226.400 cm/s AV VTI:            0.412 m AV Peak Grad:      45.6 mmHg AV Mean Grad:      25.0 mmHg LVOT Vmax:         139.00 cm/s LVOT Vmean:        97.000 cm/s LVOT VTI:          0.220 m LVOT/AV VTI ratio: 0.53 MITRAL VALVE                TRICUSPID VALVE MV Area (PHT): 2.16 cm     TR Peak grad:   18.3 mmHg  MV Peak grad:  7.5 mmHg     TR Vmax:        214.00 cm/s MV Mean grad:  3.0 mmHg MV Vmax:       1.37 m/s     SHUNTS MV Vmean:      79.8 cm/s    Systemic VTI:  0.22 m MV Decel Time: 352 msec     Systemic Diam: 2.10 cm MV E velocity: 71.60 cm/s MV A velocity: 129.00 cm/s MV E/A ratio:  0.56 Sherry [name] Electronically signed by Sherry [name] Signature Date/Time: 02/01/2020/4:38:56 PM    Final      Medications:     Scheduled Medications: . aspirin EC  81 mg Oral Daily  . Chlorhexidine Gluconate Cloth  6 each Topical Daily  . enoxaparin (LOVENOX) injection  30 mg Subcutaneous Q24H  . lidocaine  1 patch Transdermal Q24H  . mupirocin ointment  1 application Nasal BID  . PHENobarbital  129.6 mg Oral QHS  . sodium chloride flush  3 mL Intravenous Q12H  . zonisamide  200 mg Oral QHS    Infusions: . sodium chloride    . cefTRIAXone (ROCEPHIN)  IV 1 g (02/01/20 1641)  . lactated ringers      PRN Medications: sodium chloride, acetaminophen **OR** acetaminophen, ondansetron (ZOFRAN) IV, sodium chloride flush    Patient Profile   Sherry [name] 74 y/o woman with CAD, aortic stenosis, HTN, hypertophic CM. HF team consulted for cardiogenic shock. RHC ok with low filling pressures and normal output.   Assessment/Plan  1. Cardiogenic shock Hypotensive and weak on admit. UA + . Urine Cx pending. On rocephin.  ECHO this admit EF 55-60%,RV normal  Lactic acid 2.5>1.1 -Blood cultures obtained 02/01/20 - CVP low. Hemodynamics ok. Cardiac output 3 CI 25.  - Will stop fluid restrictions. Taking in pos. No diuretics.  - Remove swan.   2. CKD IV  Creatinine 1.8>pending.  Check  BMET now  3. Aortic stenosis  Severe calcification on ECHO this admit.   4. Severe LVH  5. Pericardial effusion Small pericardial effusion on ECHO this admit   6. 3v CAD No chest pain. On aspirin. Restart atorvastatin.  7. 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    8. Anemia  Hgb 10.7>9.5  Check CBC  9. R foot pain  Possible gout. Check uric acid. If elevated will give prednisone.   Check CBC, BMET, and uric acid now. OOB.   HF team will sign off. Could repeat PYP.    Length of Stay: 1  Sherry Clegg, NP  02/02/2020, 8:15 AM  Advanced Heart Failure Team Pager [phone] (M-F; [place])  Please contact [facility] for night-coverage after hours (4p -7a ) and weekends on [email or web address]  Agree with above.   Feels better this am. Sherry [name] numbers reviewed personally. Volume status still low but cardiac output and lactates have normalized.   C/o R foot pain. Uric acid elevated  General:  Elderly. Weak appearing. No resp difficulty HEENT: normal Neck: supple. RIJ swan Carotids 2+ bilat; no bruits. No lymphadenopathy or thryomegaly appreciated. Cor: PMI nondisplaced. Regular rate & rhythm. 2/6 SEM RUSB Lungs: clear Abdomen: obese soft, nontender, nondistended. No hepatosplenomegaly. No bruits or masses. Good bowel sounds. Extremities: no cyanosis, clubbing, rash, edema Neuro: alert & orientedx3, cranial nerves grossly intact. moves all 4 extremities w/o difficulty. Affect pleasant  She is much improved. Can pull swan. Continue to hold diuretics. Suspect HCM and not amyloid. Can transfer out of ICU. [facility] to resume care.   CRITICAL CARE Performed by: Glori [name]  Total critical care time: 35 minutes  Critical care time was exclusive of separately billable procedures and treating other patients.  Critical care was necessary to treat or prevent imminent or life-threatening deterioration.  Critical care was time spent personally by me (independent of midlevel providers or residents) on the following activities: development of treatment plan with patient and/or surrogate as well as nursing, discussions with consultants, evaluation of patient's response to treatment, examination of patient, obtaining history from patient or surrogate, ordering  and performing treatments and interventions, ordering and review of laboratory studies, ordering and review of radiographic studies, pulse oximetry and re-evaluation of patient's condition.  Glori Bickers, [name]  4:40 PM

## 2020-02-02 NOTE — Progress Notes (Signed)
Things That May Be Affecting Your Health:  Alcohol  Hearing loss x Pain    Depression  Home Safety  Sexual Health   Diabetes  Lack of physical activity x Stress  x Difficulty with daily activities  Loneliness  Tiredness   Drug use  Medicines  Tobacco use  x Falls  Motor Vehicle Safety  Weight   Food choices  Oral Health  Other    YOUR PERSONALIZED HEALTH PLAN : 1. Schedule your next subsequent Medicare Wellness visit in one year 2. Attend all of your regular appointments to address your medical issues 3. Complete the preventative screenings and services   Annual Wellness Visit   Medicare Covered Preventative Screenings and [place] Men and Women Who How Often Need? Date of Last Service Action  Abdominal Aortic Aneurysm Adults with AAA risk factors Once     Alcohol Misuse and Counseling All Adults Screening once a year if no alcohol misuse. Counseling up to 4 face to face sessions.     Bone Density Measurement  Adults at risk for osteoporosis Once every 2 yrs x    Lipid Panel Z13.6 All adults without CV disease Once every 5 yrs x    Colorectal Cancer   Stool sample or  Colonoscopy All adults 27 and older   Once every year  Every 10 years     Depression All Adults Once a year x Today   Diabetes Screening Blood glucose, post glucose load, or GTT Z13.1  All adults at risk  Pre-diabetics  Once per year  Twice per year     Diabetes  Self-Management Training All adults Diabetics 10 hrs first year; 2 hours subsequent years. Requires Copay     Glaucoma  Diabetics  Family history of glaucoma  African Americans 27 yrs +  Hispanic Americans 27 yrs + Annually - requires coppay     Hepatitis C Z72.89 or F19.20  High Risk for HCV  Born between 1945 and 1965  Annually  Once     HIV Z11.4 All adults based on risk  Annually btw ages 40 & 22 regardless of risk  Annually > 65 yrs if at increased risk     Lung Cancer Screening Asymptomatic adults  aged 73-77 with 30 pack yr history and current smoker OR quit within the last 15 yrs Annually Must have counseling and shared decision making documentation before first screen     Medical Nutrition Therapy Adults with   Diabetes  Renal disease  Kidney transplant within past 3 yrs 3 hours first year; 2 hours subsequent years x    Obesity and Counseling All adults Screening once a year Counseling if BMI 30 or higher  Today   Tobacco Use Counseling Adults who use tobacco  Up to 8 visits in one year     Vaccines Z23  Hepatitis B  Influenza   Pneumonia  Adults   Once  Once every flu season  Two different vaccines separated by one year     Next Annual Wellness Visit People with Medicare Every year  Today     Services & Screenings Women Who How Often Need  Date of Last Service Action  Mammogram  Z12.31 Women over 28 One baseline ages 32-39. Annually ager 40 yrs+     Pap tests All women Annually if high risk. Every 2 yrs for normal risk women     Screening for cervical cancer with   Pap (Z01.419 nl or Z01.411abnl) &  HPV Z11.51 Women aged 51 to 43 Once every 5 yrs     Screening pelvic and breast exams All women Annually if high risk. Every 2 yrs for normal risk women     Sexually Transmitted Diseases  Chlamydia  Gonorrhea  Syphilis All at risk adults Annually for non pregnant females at increased risk         [place] Men Who How Ofter Need  Date of Last Service Action  Prostate Cancer - DRE & PSA Men over 50 Annually.  DRE might require a copay.     Sexually Transmitted Diseases  Syphilis All at risk adults Annually for men at increased risk

## 2020-02-03 ENCOUNTER — Inpatient Hospital Stay (HOSPITAL_COMMUNITY): Payer: Medicare HMO

## 2020-02-03 DIAGNOSIS — I313 Pericardial effusion (noninflammatory): Secondary | ICD-10-CM

## 2020-02-03 DIAGNOSIS — R571 Hypovolemic shock: Secondary | ICD-10-CM

## 2020-02-03 LAB — CBC
HCT: 29.6 % — ABNORMAL LOW (ref 36.0–46.0)
Hemoglobin: 10 g/dL — ABNORMAL LOW (ref 12.0–15.0)
MCH: 30.4 pg (ref 26.0–34.0)
MCHC: 33.8 g/dL (ref 30.0–36.0)
MCV: 90 fL (ref 80.0–100.0)
Platelets: 301 10*3/uL (ref 150–400)
RBC: 3.29 MIL/uL — ABNORMAL LOW (ref 3.87–5.11)
RDW: 12.2 % (ref 11.5–15.5)
WBC: 4.7 10*3/uL (ref 4.0–10.5)
nRBC: 0 % (ref 0.0–0.2)

## 2020-02-03 LAB — BASIC METABOLIC PANEL
Anion gap: 10 (ref 5–15)
BUN: 54 mg/dL — ABNORMAL HIGH (ref 8–23)
CO2: 18 mmol/L — ABNORMAL LOW (ref 22–32)
Calcium: 7.3 mg/dL — ABNORMAL LOW (ref 8.9–10.3)
Chloride: 104 mmol/L (ref 98–111)
Creatinine, Ser: 1.45 mg/dL — ABNORMAL HIGH (ref 0.44–1.00)
GFR, Estimated: 38 mL/min — ABNORMAL LOW (ref >60)
Glucose, Bld: 102 mg/dL — ABNORMAL HIGH (ref 70–99)
Potassium: 4.9 mmol/L (ref 3.5–5.1)
Sodium: 132 mmol/L — ABNORMAL LOW (ref 135–145)

## 2020-02-03 LAB — PROTEIN ELECTROPHORESIS, SERUM
A/G Ratio: 0.8 (ref 0.7–1.7)
Albumin ELP: 2.6 g/dL — ABNORMAL LOW (ref 2.9–4.4)
Alpha-1-Globulin: 0.3 g/dL (ref 0.0–0.4)
Alpha-2-Globulin: 1 g/dL (ref 0.4–1.0)
Beta Globulin: 0.9 g/dL (ref 0.7–1.3)
Gamma Globulin: 1.2 g/dL (ref 0.4–1.8)
Globulin, Total: 3.4 g/dL (ref 2.2–3.9)
Total Protein ELP: 6 g/dL (ref 6.0–8.5)

## 2020-02-03 LAB — ECHOCARDIOGRAM LIMITED
Height: 61 in
S' Lateral: 2.6 cm
Weight: 2342.17 oz

## 2020-02-03 LAB — IRON AND TIBC
Iron: 20 ug/dL — ABNORMAL LOW (ref 28–170)
Saturation Ratios: 13 % (ref 10.4–31.8)
TIBC: 154 ug/dL — ABNORMAL LOW (ref 250–450)
UIBC: 134 ug/dL

## 2020-02-03 LAB — KAPPA/LAMBDA LIGHT CHAINS
Kappa free light chain: 60.6 mg/L — ABNORMAL HIGH (ref 3.3–19.4)
Kappa, lambda light chain ratio: 1.2 (ref 0.26–1.65)
Lambda free light chains: 50.5 mg/L — ABNORMAL HIGH (ref 5.7–26.3)

## 2020-02-03 LAB — URINE CULTURE: Culture: 80000 — AB

## 2020-02-03 LAB — VITAMIN B12: Vitamin B-12: 296 pg/mL (ref 180–914)

## 2020-02-03 LAB — IMMUNOFIXATION, URINE

## 2020-02-03 LAB — FOLATE: Folate: 8.2 ng/mL (ref >5.9)

## 2020-02-03 LAB — FERRITIN: Ferritin: 222 ng/mL (ref 11–307)

## 2020-02-03 MED ORDER — MUPIROCIN 2 % EX OINT: 1.0000 "application " | TOPICAL_OINTMENT | Freq: Two times a day (BID) | CUTANEOUS | 0 refills | Status: DC

## 2020-02-03 MED ORDER — LIDOCAINE 5 % EX PTCH: 1.0000 | MEDICATED_PATCH | CUTANEOUS | 0 refills | Status: DC

## 2020-02-03 MED ORDER — ZONISAMIDE 100 MG PO CAPS: 200.0000 mg | ORAL_CAPSULE | Freq: Every day | ORAL | 0 refills | Status: DC

## 2020-02-03 MED ORDER — ROSUVASTATIN CALCIUM 10 MG PO TABS: 10.0000 mg | ORAL_TABLET | Freq: Every day | ORAL | 0 refills | Status: DC

## 2020-02-03 MED ORDER — PHENOBARBITAL 64.8 MG PO TABS: 129.6000 mg | ORAL_TABLET | Freq: Every day | ORAL | 0 refills | Status: DC

## 2020-02-03 NOTE — Progress Notes (Signed)
Pt d/c with all belongings including clothes and shoes.  Follow-up apts and instructions given to patient and husband.

## 2020-02-03 NOTE — Progress Notes (Signed)
Progress Note  Patient Name: Sherry [name] Date of Encounter: 02/03/2020  [facility] Cardiologist: Sherren Mocha, MD   Subjective   Denies any chest pain or dyspnea.  Inpatient Medications    Scheduled Meds: . aspirin EC  81 mg Oral Daily  . Chlorhexidine Gluconate Cloth  6 each Topical Daily  . enoxaparin (LOVENOX) injection  30 mg Subcutaneous Q24H  . feeding supplement  237 mL Oral BID BM  . lidocaine  1 patch Transdermal Q24H  . mupirocin ointment  1 application Nasal BID  . PHENobarbital  129.6 mg Oral QHS  . rosuvastatin  10 mg Oral Daily  . sodium chloride flush  3 mL Intravenous Q12H  . zonisamide  200 mg Oral QHS   Continuous Infusions: . sodium chloride 10 mL/hr at 02/03/20 0200  . cefTRIAXone (ROCEPHIN)  IV 200 mL/hr at 02/02/20 1900  . lactated ringers     PRN Meds: sodium chloride, acetaminophen **OR** acetaminophen, ondansetron (ZOFRAN) IV, sodium chloride flush   Vital Signs    Vitals:   02/03/20 0333 02/03/20 0400 02/03/20 0500 02/03/20 0600  BP:  (!) 149/69 135/66 131/68  Pulse:  70 80 71  Resp:  _0 Temp: 99.4 F (37.4 C)     TempSrc: Oral     SpO2:  98% 98% 98%  Weight:   66.4 kg   Height:        Intake/Output Summary (Last 24 hours) at 02/03/2020 0721 Last data filed at 02/03/2020 0600 Gross per 24 hour  Intake 1317.06 ml  Output 1650 ml  Net -332.94 ml   Last 3 Weights 02/03/2020 02/02/2020 01/31/2020  Weight (lbs) 146 lb 6.2 oz 149 lb 7.6 oz 137 lb 9.1 oz  Weight (kg) 66.4 kg 67.8 kg 62.4 kg      Telemetry    NSR 60-70s - Personally Reviewed  ECG    No new ECG- Personally Reviewed  Physical Exam   GEN: No acute distress.   Neck: No JVD Cardiac: RRR, 3/6 systolic murmur loudest at RUSB Respiratory: Clear to auscultation bilaterally. GI: Soft, nontender, non-distended  MS: No edema; No deformity. Neuro:  Nonfocal  Psych: Normal affect   Labs    High Sensitivity Troponin:   Recent Labs  Lab  01/31/20 1528 01/31/20 1728  TROPONINIHS 32* 35*      Chemistry Recent Labs  Lab 02/01/20 0227 02/01/20 0744 02/01/20 0915 02/01/20 1749 02/01/20 1752 02/02/20 0817 02/03/20 0350  NA 129* SPECIMEN HEMOLYZED. HEMOLYSIS MAY AFFECT INTEGRITY OF RESULTS. 131*   < > 134* 131* 132*  K 4.7 SPECIMEN HEMOLYZED. HEMOLYSIS MAY AFFECT INTEGRITY OF RESULTS. 4.2   < > 4.5 4.6 4.9  CL 99 SPECIMEN HEMOLYZED. HEMOLYSIS MAY AFFECT INTEGRITY OF RESULTS. 98  --   --  101 104  CO2 16* SPECIMEN HEMOLYZED. HEMOLYSIS MAY AFFECT INTEGRITY OF RESULTS. 17*  --   --  18* 18*  GLUCOSE 157* SPECIMEN HEMOLYZED. HEMOLYSIS MAY AFFECT INTEGRITY OF RESULTS. 148*  --   --  123* 102*  BUN 79* SPECIMEN HEMOLYZED. HEMOLYSIS MAY AFFECT INTEGRITY OF RESULTS. 79*  --   --  70* 54*  CREATININE 1.91* SPECIMEN HEMOLYZED. HEMOLYSIS MAY AFFECT INTEGRITY OF RESULTS. 1.88*  --   --  1.65* 1.45*  CALCIUM 8.3* SPECIMEN HEMOLYZED. HEMOLYSIS MAY AFFECT INTEGRITY OF RESULTS. 8.5*  --   --  7.6* 7.3*  PROT 6.4*  --   --   --   --   --   --  ALBUMIN 2.6*  --   --   --   --   --   --   AST 39  --   --   --   --   --   --   ALT 42  --   --   --   --   --   --   ALKPHOS 67  --   --   --   --   --   --   BILITOT 0.5  --   --   --   --   --   --   GFRNONAA 27* SPECIMEN HEMOLYZED. HEMOLYSIS MAY AFFECT INTEGRITY OF RESULTS. 28*  --   --  33* 52*  GFRAA  --  SPECIMEN HEMOLYZED. HEMOLYSIS MAY AFFECT INTEGRITY OF RESULTS.  --   --   --   --   --   ANIONGAP 14 SPECIMEN HEMOLYZED. HEMOLYSIS MAY AFFECT INTEGRITY OF RESULTS. 16*  --   --  12 10   < > = values in this interval not displayed.     Hematology Recent Labs  Lab 02/01/20 0744 02/01/20 1749 02/01/20 1752 02/02/20 0817 02/03/20 0350  WBC 9.3  --   --  5.3 4.7  RBC 3.50*  --   --  3.44* 3.29*  HGB 10.7*   < > 9.5* 10.1* 10.0*  HCT 31.5*   < > 28.0* 31.4* 29.6*  MCV 90.0  --   --  91.3 90.0  MCH 30.6  --   --  29.4 30.4  MCHC 34.0  --   --  32.2 33.8  RDW 12.2  --   --  12.2  12.2  PLT 400  --   --  335 301   < > = values in this interval not displayed.    BNP Recent Labs  Lab 01/31/20 1528  BNP 167.9*     DDimer No results for input(s): DDIMER in the last 168 hours.   Radiology    CARDIAC CATHETERIZATION  Result Date: 02/01/2020 Findings: Ao = 108/56 (76) LV = 136/4 RA =  2 RV = 27/7 PA = 30/2 (13) PCW = 5 Fick cardiac output/index = 4.3/2.7 Thermo CO/CI = 4.1/2.5 PVR = 2.0 WU FA sat = 99% PA sat = 66% No RV/LV interaction AoV: mean gradient 34mHG AVA 1.47 cm2 (Fick) 1.38 cm2 (Thermo) Assessment: 1. Low filling pressures with normal output 2. Moderate aortic stenosis Plan/Discussion: Continue hydration. DGlori Bickers MD 6:56 PM   ECHOCARDIOGRAM LIMITED  Result Date: 02/01/2020    ECHOCARDIOGRAM LIMITED REPORT   Patient Name:   Sherry FORONDADate of Exam: 02/01/2020 Medical Rec #:  [id number]        Height:       61.0 in Accession #:    [id number]       Weight:       137.6 lb Date of Birth:  4June 14, 1947        BSA:          1.611 m Patient Age:    89years          BP:           101/55 mmHg Patient Gender: F                 HR:           70 bpm. Exam Location:  Inpatient Procedure: Limited Echo, Limited Color Doppler and Cardiac Doppler Indications:  Aortic Stenosis 424.1 / 135.0  History:        Patient has prior history of Echocardiogram examinations, most                 recent 10/07/2019. CHF, CAD; TIA. Chronic kidney disease.                 Bilateral lower extremity edema. Bradycardia. Heart block AV                 second degree. Mobitz Type I (Wenckebach) atrioventricular                 block.  Sonographer:    Darlina Sicilian RDCS Referring Phys: [id number] Velna Ochs  Sonographer Comments: Technically difficult study due to poor echo windows. IMPRESSIONS  1. Left ventricular ejection fraction, by estimation, is 55 to 60%. The left ventricle has normal function. There is severe concentric left ventricular hypertrophy. Diastology indeterminant  due to severe MAC.  2. Right ventricular systolic function is normal. The right ventricular size is normal. Tricuspid regurgitation signal is inadequate for assessing PA pressure.  3. A small, circumferential pericaridal effusion is present. Appears to be largest around the RV measuring 1.1cm at end diastole.  4. The mitral valve is abnormal. Trivial mitral valve regurgitation. No evidence of mitral stenosis. Severe mitral annular calcification.  5. The aortic valve is calcified. There is severe calcifcation of the aortic valve. There is severe thickening of the aortic valve. Aortic valve regurgitation is not visualized. Suspect severe low flow, low gradient aortic stenosis with preserved EF. Mean gradient 21mHg, peak 527mg, vmax 3.70m55m AVA 1.2cm2 by continuty. DOI 0.28. Suspect AVA and DOI underestimate severity of AS due to difficulty obtaining coxial doppler interrogation.  6. The inferior vena cava is normal in size with greater than 50% respiratory variability, suggesting right atrial pressure of 3 mmHg.  7. Findings concerning for possible amyloidosis. Comparison(s): Compared to prior study in 09/2019, there is no significant change. FINDINGS  Left Ventricle: Left ventricular ejection fraction, by estimation, is 55 to 60%. The left ventricle has normal function. The left ventricular internal cavity size was small. There is severe concentric left ventricular hypertrophy. Diastology indeterminant due to severe MAC. Right Ventricle: The right ventricular size is normal. Right vetricular wall thickness was not well visualized. Right ventricular systolic function is normal. Tricuspid regurgitation signal is inadequate for assessing PA pressure. The tricuspid regurgitant velocity is 2.14 m/s, and with an assumed right atrial pressure of 3 mmHg, the estimated right ventricular systolic pressure is 21.95.1Hg. Pericardium: A small, circumferential pericaridal effusion is present. Appears to be largest around the RV  measuring 1.1cm at end diastole. A small pericardial effusion is present. The pericardial effusion is circumferential. Mitral Valve: The mitral valve is abnormal. There is moderate thickening of the mitral valve leaflet(s). There is moderate calcification of the mitral valve leaflet(s). Severe mitral annular calcification. Trivial mitral valve regurgitation. No significant mitral stenosis. Tricuspid Valve: The tricuspid valve is normal in structure. Tricuspid valve regurgitation is mild. Aortic Valve: There is severe calcifcation of the aortic valve. There is severe thickening of the aortic valve. Aortic valve regurgitation is not visualized. Suspect severe low flow, low gradient aortic stenosis with preserved EF. Mean gradient 66m81m peak 570mm56mvmax 3.70m/s.77mA 1.2cm2 by continuty. DOI 0.28. Suspect AVA and DOI underestimate severity of AS due to difficulty obtaining coaxial doppler interrogation. Pulmonic Valve: The pulmonic valve was grossly normal. Pulmonic valve regurgitation is not visualized. Venous:  The inferior vena cava is normal in size with greater than 50% respiratory variability, suggesting right atrial pressure of 3 mmHg. LEFT VENTRICLE PLAX 2D LVIDd:         3.80 cm  Diastology LVIDs:         2.70 cm  LV e' medial:    3.12 cm/s LV PW:         0.90 cm  LV E/e' medial:  22.9 LV IVS:        1.00 cm  LV e' lateral:   2.56 cm/s LVOT diam:     2.10 cm  LV E/e' lateral: 28.0 LV SV:         76 LV SV Index:   47 LVOT Area:     3.46 cm                          3D Volume EF:                         3D EF:        54 %                         LV EDV:       108 ml                         LV ESV:       50 ml                         LV SV:        58 ml AORTIC VALVE AV Area (Vmax):    1.43 cm AV Area (Vmean):   1.48 cm AV Area (VTI):     1.85 cm AV Vmax:           337.80 cm/s AV Vmean:          226.400 cm/s AV VTI:            0.412 m AV Peak Grad:      45.6 mmHg AV Mean Grad:      25.0 mmHg LVOT Vmax:          139.00 cm/s LVOT Vmean:        97.000 cm/s LVOT VTI:          0.220 m LVOT/AV VTI ratio: 0.53 MITRAL VALVE                TRICUSPID VALVE MV Area (PHT): 2.16 cm     TR Peak grad:   18.3 mmHg MV Peak grad:  7.5 mmHg     TR Vmax:        214.00 cm/s MV Mean grad:  3.0 mmHg MV Vmax:       1.37 m/s     SHUNTS MV Vmean:      79.8 cm/s    Systemic VTI:  0.22 m MV Decel Time: 352 msec     Systemic Diam: 2.10 cm MV E velocity: 71.60 cm/s MV A velocity: 129.00 cm/s MV E/A ratio:  0.56 Gwyndolyn Kaufman MD Electronically signed by Gwyndolyn Kaufman MD Signature Date/Time: 02/01/2020/4:38:56 PM    Final     Cardiac Studies   Echo 02/01/20: 1. Left ventricular ejection fraction, by estimation, is 55 to 60%. The  left ventricle has normal function. There is severe concentric left  ventricular hypertrophy. Diastology indeterminant due to severe MAC.  2. Right ventricular systolic function is normal. The right ventricular  size is normal. Tricuspid regurgitation signal is inadequate for assessing  PA pressure.  3. A small, circumferential pericaridal effusion is present. Appears to  be largest around the RV measuring 1.1cm at end diastole.  4. The mitral valve is abnormal. Trivial mitral valve regurgitation. No  evidence of mitral stenosis. Severe mitral annular calcification.  5. The aortic valve is calcified. There is severe calcifcation of the  aortic valve. There is severe thickening of the aortic valve. Aortic valve  regurgitation is not visualized. Suspect severe low flow, low gradient  aortic stenosis with preserved EF.  Mean gradient 45mHg, peak 561mg, vmax 3.26m36m AVA 1.2cm2 by continuty.  DOI 0.28. Suspect AVA and DOI underestimate severity of AS due to  difficulty obtaining coxial doppler interrogation.  6. The inferior vena cava is normal in size with greater than 50%  respiratory variability, suggesting right atrial pressure of 3 mmHg.  7. Findings concerning for possible amyloidosis.    RHCGalax/14/21:  Findings:  Ao = 108/56 (76) LV = 136/4 RA =  2 RV = 27/7 PA = 30/2 (13) PCW = 5 Fick cardiac output/index = 4.3/2.7 Thermo CO/CI = 4.1/2.5 PVR = 2.0 WU FA sat = 99% PA sat = 66% No RV/LV interaction AoV: mean gradient 2m3mAVA 1.47 cm2 (Fick) 1.38 cm2 (Thermo)  Assessment: 1. Low filling pressures with normal output 2. Moderate aortic stenosis   Plan/Discussion:  Continue hydration.   Patient Profile     74 y48. female with a history of severe aortic stenosis, CKD stage III, chronic diastolic heart failure, CAD, hypertension who presents with hypovolemic shock  Assessment & Plan    Hypovolemic shock: Secondary to overdiuresis.  Lactate normalized with IV fluids.  Echo showed LVEF 55 to 60%, severe LVH, severe paradoxical low flow low gradient AS.  RHC [place]12/14 showed RA 2, RV 27/7, PA 30/2/13, PCWP 5, CI 2.7, PA sat 66%, aortic valve mean gradient 22. -Resolved with IV fluids, currently normotensive.  Hold further diuresis  AKI: Creatinine 2.1 on admission.  Suspect prerenal in setting of dehydration, improved with IV fluids.  Creatinine 1.45 today  Pericardial effusion: Appears moderate on echo 12/14 with intermittent RV diastolic collapse seen, though IVC small/collapsible and no significant mitral inflow respiratory variation.  She has chronic pericardial effusion, suspect seeing some early tamponade physiology in setting of severe dehydration with low RA/RV pressures.  Would expect to be resolved now that she has been fluid resuscitated, will repeat limited echo  Aortic stenosis: Echo suggests paradoxical low-flow low gradient severe AS.  Follows with Dr. CoopBurt Knackvere [facility]: Concerning for cardiac amyloidosis.  PYP scan negative in 2019.  Light chains/SPEP/UPEP pending to evaluate for AL amyloid   For questions or updates, please contact [place] consult [email or web address] for contact info under        Signed, ChriDonato Heinz  02/03/2020, 7:21 AM

## 2020-02-03 NOTE — Progress Notes (Signed)
  Echocardiogram 2D Echocardiogram has been performed.  Sherry [name] 02/03/2020, 9:15 AM

## 2020-02-03 NOTE — Progress Notes (Addendum)
Subjective: HD#3 No acute overnight events. Sherry [name] has been coughing for several days. Coughed on examination but denies SOB. She hasn't noticed any leg swelling. She denies dysuria. She states that she feels comfortable going home today if she is able to.   Objective:  Vital signs in last 24 hours: Vitals:   02/03/20 0900 02/03/20 1400 02/03/20 1500 02/03/20 1519  BP: 135/70 129/61 126/64   Pulse: 78 91 84   Resp: 16 17 17    Temp:    98.7 F (37.1 C)  TempSrc:      SpO2: 98% 99% 98%   Weight:      Height:       CBC Latest Ref Rng & Units 02/03/2020 02/02/2020 02/01/2020  WBC 4.0 - 10.5 K/uL 4.7 5.3 -  Hemoglobin 12.0 - 15.0 g/dL 10.0(L) 10.1(L) 9.5(L)  Hematocrit 36.0 - 46.0 % 29.6(L) 31.4(L) 28.0(L)  Platelets 150 - 400 K/uL 301 335 -   BMP Latest Ref Rng & Units 02/03/2020 02/02/2020 02/01/2020  Glucose 70 - 99 mg/dL 102(H) 123(H) -  BUN 8 - 23 mg/dL 54(H) 70(H) -  Creatinine 0.44 - 1.00 mg/dL 1.45(H) 1.65(H) -  BUN/Creat Ratio 12 - 28 - - -  Sodium 135 - 145 mmol/L 132(L) 131(L) 134(L)  Potassium 3.5 - 5.1 mmol/L 4.9 4.6 4.5  Chloride 98 - 111 mmol/L 104 101 -  CO2 22 - 32 mmol/L 18(L) 18(L) -  Calcium 8.9 - 10.3 mg/dL 7.3(L) 7.6(L) -   Physical exam: General: Well developed, elderly female lying comfortably in bed, NAD CV: RRR, S1, S2 normal, 3/6 AS murmur Pulmonary: Faint inspiratory crackles in lower lung fields Extremities: No edema Neuro: AAO*3 Psych: Normal mood and affect.  Assessment/Plan:  Sherry [name] is a 74 yo F with a pmhx of HTN, HLD, LVH, multivessel CAD, chronic bradycardia, chronic 1st degree AV block, remote history of Mobitz type 1, HFpEF, possible infiltrative cardiomyopathy admitted for hypovolemic shock in the setting of overdiuresis, improved after IVF resuscitation.   Principal Problem:   Hypovolemic shock (HCC) Active Problems:   Chronic sinus bradycardia   Severe aortic stenosis   AKI (acute kidney injury) ([place])    Hypotension  Hypovolemic Shock- Resolved  Due to over diuresis in the setting of aortic stenosis and likely infiltrative cardiomyopathy. Greatly appreciate cardiology and HF team's assistance. Blood pressure has improved after fluids.   -Continue holding Lasix -Encourage oral fluids   AKI on CKD IIIb: Pre renal in the setting of hypovolemia, improved with IVFs.   - Continue to monitor. - Strict I & O - avoid nephrotoxic medications - Holding lasix at this time   UTI- Resolved  Symptoms have improved and antibiotics course completed today.  - D/C Ceftriaxone    HFpEF, severe LVH,  ?Cardiac Amyloidosis  Multivessel CAD Moderate AS Chronic Bradycardia, 1st Degree AV block Remote history of Mobitz type I (2014)  Continue to improve today, with no SOB, some dry cough.  - Continue telemetry - ASA 81 daily -  Holding lasix.    HTN Holding home lisinopril and amlodipine    Normocytic Anemia New this admission. No clinical evidence of bleeding. Iron studies shows Iron 20, TIBC 154, Ferritin 222. Folate 8.2 , Vitamin B12: 296. Possibly anemia of chronic disease.  Last colonoscopy was in 2008, report not available.   - Will need outpatient follow up and GI referral for repeat screening colonoscopy  Prior to Admission Living Arrangement:Home Anticipated Discharge Location: Home Barriers to Discharge:None Dispo:  Anticipated discharge in approximately 0 day(s).   Honor Junes, MD 02/03/2020, 3:54 PM Pager: [phone] After 5pm on weekdays and 1pm on weekends: On Call pager [phone]

## 2020-02-04 NOTE — Discharge Summary (Signed)
Name: Sherry [name] MRN: [id number] DOB: 12/14/1945 74 y.o. PCP: Marty Heck, DO  Date of Admission: 01/31/2020  3:16 PM Date of Discharge: 02/03/2020 Attending Physician: Dr. Philipp Ovens Discharge Diagnosis: 1. Hypovolemic Shock 2. AKI 3. Severe Aortic Stenosis 4. UTI 5. Hypotension  Discharge Medications: Allergies as of 02/03/2020      Reactions   Ibuprofen Other (See Comments)   Dr advised pt not to take ibuprofen      Medication List    STOP taking these medications   amLODipine 10 MG tablet Commonly known as: NORVASC   furosemide 40 MG tablet Commonly known as: LASIX   lisinopril 40 MG tablet Commonly known as: ZESTRIL   pravastatin 40 MG tablet Commonly known as: PRAVACHOL   predniSONE 20 MG tablet Commonly known as: DELTASONE     TAKE these medications   alendronate 70 MG tablet Commonly known as: FOSAMAX TAKE HALF A TABLET BY MOUTH ONCE A WEEK What changed: See the new instructions.   aspirin 81 MG EC tablet Take 81 mg by mouth daily.   Calcium Carb-Cholecalciferol 500-400 MG-UNIT Tabs Commonly known as: Calcium 500 +D Take 1 tablet by mouth 2 (two) times daily.   lidocaine 5 % Commonly known as: LIDODERM Place 1 patch onto the skin daily. Remove & Discard patch within 12 hours or as directed by MD   mupirocin ointment 2 % Commonly known as: BACTROBAN Place 1 application into the nose 2 (two) times daily.   PHENobarbital 64.8 MG tablet Commonly known as: LUMINAL Take 2 tablets (129.6 mg total) by mouth at bedtime.   rosuvastatin 10 MG tablet Commonly known as: CRESTOR Take 1 tablet (10 mg total) by mouth daily.   zonisamide 100 MG capsule Commonly known as: ZONEGRAN Take 2 capsules (200 mg total) by mouth at bedtime. What changed:   how much to take  how to take this  when to take this  additional instructions       Disposition and follow-up:   [name] was discharged from [facility]  in Stable condition.  At the hospital follow up visit please address:  1. A. Hypovolemic Shock: resolved, no further follow up. B. AKI: resolved, no further follow up. C. Severe Aortic Stenosis: Follows with Dr. Burt Knack D. UTI: resolved, no further follow up. 5. Hypotension: Please do no start your home medications before seeing your PP on coming Tuesday.  2.  Labs / imaging needed at time of follow-up: None  3.  Pending labs/ test needing follow-up: daily BP monitoring  Follow-up Appointments:  Follow-up Information    Maudie Mercury, MD. Go on 02/08/2020.   Specialty: Internal Medicine Why: _0 :45 pm Contact information: [address] [address] [phone]               Hospital Course by problem list: [name] 74 y.o. female with a history of severe aortic stenosis, CKD stage III, chronic diastolic heart failure, CAD, hypertension who presents with hypovolemic shock    Hypovolemic shock: Resolved: Secondary to overdiuresis.  Lactate normalized with IV fluids.  Echo showed LVEF 55 to 60%, severe LVH, severe paradoxical low flow low gradient AS.  [place] on 12/14 showed RA 2, RV 27/7, PA 30/2/13, PCWP 5, CI 2.7, PA sat 66%, aortic valve mean gradient 22. Resolved with IV fluids, currently normotensive.  Hold further diuresis   AKI: Improved: Creatinine 2.1 on admission.  Suspect prerenal in setting of dehydration, improved with IV fluids.  Creatinine 1.45   UTI: Resolved-s/p Ceftriaxone   Pericardial effusion: Appears moderate on echo 12/14 with intermittent RV diastolic collapse seen, though IVC small/collapsible and no significant mitral inflow respiratory variation. She has chronic pericardial effusion, suspect seeing some early tamponade physiology in setting of severe dehydration with low RA/RV pressures.  Would expect to be resolved now that she has been fluid resuscitated.  Aortic stenosis: Echo suggests paradoxical low-flow low gradient  severe AS.  Follows with Dr. Burt Knack   Severe LVH: Concerning for cardiac amyloidosis.  PYP scan negative in 2019.  Light chains/SPEP/UPEP pending to evaluate for AL amyloid  Normocytic Anemia New this admission. No clinical evidence of bleeding. Iron studies shows Iron 20, TIBC 154, Ferritin 222. Folate 8.2 , Vitamin B12: 296. Possibly anemia of chronic disease.  Last colonoscopy was in 2008, report not available. Will need outpatient follow up and GI referral for repeat screening colonoscopy    Discharge Vitals:   BP 138/77   Pulse (!) 107   Temp 98.7 F (37.1 C)   Resp 18   Ht _0  (1.549 m)   Wt 66.4 kg   SpO2 96%   BMI 27.66 kg/m   Pertinent Labs, Studies, and Procedures:  CBC Latest Ref Rng & Units 02/03/2020 02/02/2020 02/01/2020  WBC 4.0 - 10.5 K/uL 4.7 5.3 -  Hemoglobin 12.0 - 15.0 g/dL 10.0(L) 10.1(L) 9.5(L)  Hematocrit 36.0 - 46.0 % 29.6(L) 31.4(L) 28.0(L)  Platelets 150 - 400 K/uL 301 335 -   BMP Latest Ref Rng & Units 02/03/2020 02/02/2020 02/01/2020  Glucose 70 - 99 mg/dL 102(H) 123(H) -  BUN 8 - 23 mg/dL 54(H) 70(H) -  Creatinine 0.44 - 1.00 mg/dL 1.45(H) 1.65(H) -  BUN/Creat Ratio 12 - 28 - - -  Sodium 135 - 145 mmol/L 132(L) 131(L) 134(L)  Potassium 3.5 - 5.1 mmol/L 4.9 4.6 4.5  Chloride 98 - 111 mmol/L 104 101 -  CO2 22 - 32 mmol/L 18(L) 18(L) -  Calcium 8.9 - 10.3 mg/dL 7.3(L) 7.6(L) -   Echo 02/01/20: 1. Left ventricular ejection fraction, by estimation, is 55 to 60%. The  left ventricle has normal function. There is severe concentric left  ventricular hypertrophy. Diastology indeterminant due to severe MAC.  2. Right ventricular systolic function is normal. The right ventricular  size is normal. Tricuspid regurgitation signal is inadequate for assessing  PA pressure.  3. A small, circumferential pericaridal effusion is present. Appears to  be largest around the RV measuring 1.1cm at end diastole.  4. The mitral valve is abnormal. Trivial mitral  valve regurgitation. No  evidence of mitral stenosis. Severe mitral annular calcification.  5. The aortic valve is calcified. There is severe calcifcation of the  aortic valve. There is severe thickening of the aortic valve. Aortic valve  regurgitation is not visualized. Suspect severe low flow, low gradient  aortic stenosis with preserved EF.  Mean gradient 23mHg, peak 543mg, vmax 3.27m64m AVA 1.2cm2 by continuty.  DOI 0.28. Suspect AVA and DOI underestimate severity of AS due to  difficulty obtaining coxial doppler interrogation.  6. The inferior vena cava is normal in size with greater than 50%  respiratory variability, suggesting right atrial pressure of 3 mmHg.  7. Findings concerning for possible amyloidosis.   [place]:  Findings:  Ao = 108/56 (76) LV = 136/4 RA = 2 RV = 27/7 PA = 30/2 (13) PCW = 5 Fick cardiac output/index = 4.3/2.7 Thermo CO/CI = 4.1/2.5 PVR = 2.0 WU FA sat =  99% PA sat = 66% No RV/LV interaction AoV: mean gradient 36mHG AVA 1.47 cm2 (Fick) 1.38 cm2 (Thermo)  Echocardiogram 02/03/20  1. Limited study to fu on pericardial effusion; normal LV function; moderate LVH with speckled appearance (consider amyloid); calcified aortic valve with reduced cusp excursion (full doppler not performed); severe MAC; moderate pericardial effusion (no RV diastolic collapse, no respiratory flow variation, IVC not dilated; findings not suggestive of tamponade physiology); pericardial effusion similar in size compared to 02/01/20. 2. Left ventricular ejection fraction, by estimation, is 60 to 65%. The left ventricle has normal function. The left ventricle has no regional wall motion abnormalities. There is moderate left ventricular hypertrophy. 3. Right ventricular systolic function is normal. The right ventricular size is normal. 4. Left atrial size was moderately dilated. 5. Moderate pericardial effusion. 6. Severe mitral annular calcification. 7. The  aortic valve is calcified.   Discharge Instructions: Discharge Instructions    (HEART FAILURE PATIENTS) Call MD:  Anytime you have any of the following symptoms: 1) 3 pound weight gain in 24 hours or 5 pounds in 1 week 2) shortness of breath, with or without a dry hacking cough 3) swelling in the hands, feet or stomach 4) if you have to sleep on extra pillows at night in order to breathe.   Complete by: As directed    Call MD for:  difficulty breathing, headache or visual disturbances   Complete by: As directed    Call MD for:  extreme fatigue   Complete by: As directed    Call MD for:  persistant dizziness or light-headedness   Complete by: As directed    Call MD for:  persistant nausea and vomiting   Complete by: As directed    Call MD for:  severe uncontrolled pain   Complete by: As directed    Call MD for:  temperature >100.4   Complete by: As directed    Diet - low sodium heart healthy   Complete by: As directed    Discharge instructions   Complete by: As directed    [name],  You presented with shortness of breath and dizziness. Your blood pressure was low in hospital and we suspected it might be because of your water pill- Lasix.  Also, we were concerned about worsening of aortic stenosis and due to your ongoing shortness of breath and hypotension, you were seen by cardiology. Imaging studies and catheterization was done and it looked reassuring.   Your blood pressure improved with hydration and by holding diuretics.   You also noted burning during urination and we started you on antibiotic treatment for it. Your urine culture came back positive for bacteria's but your antibiotic course is complete now.  - Please do not take your home medications Amlodipine 10 mg, Furosemide (Lasix), Lisinopril until your next appointment on coming Tuesday.  -Please check your blood pressure daily and if shortness of breath develops or dizziness please contact [place].  - Call  [phone] clinic during normal hours or [phone] after hours,  If needed  We are happy you feel better! Take care and see you on Tuesday for your appointment.   Increase activity slowly   Complete by: As directed       Signed: DHonor Junes MD 02/04/2020, 11:23 AM   Pager: [phone]

## 2020-02-06 LAB — CULTURE, BLOOD (ROUTINE X 2)
Culture: NO GROWTH
Culture: NO GROWTH
Special Requests: ADEQUATE

## 2020-02-07 ENCOUNTER — Ambulatory Visit: Payer: Medicare HMO | Admitting: *Deleted

## 2020-02-07 DIAGNOSIS — I440 Atrioventricular block, first degree: Secondary | ICD-10-CM

## 2020-02-07 DIAGNOSIS — I5033 Acute on chronic diastolic (congestive) heart failure: Secondary | ICD-10-CM

## 2020-02-07 DIAGNOSIS — E782 Mixed hyperlipidemia: Secondary | ICD-10-CM

## 2020-02-07 DIAGNOSIS — Z1211 Encounter for screening for malignant neoplasm of colon: Secondary | ICD-10-CM

## 2020-02-07 NOTE — Progress Notes (Signed)
Internal Medicine Clinic Resident  I have personally reviewed this encounter including the documentation in this note and/or discussed this patient with the care management provider. I will address any urgent items identified by the care management provider and will communicate my actions to the patient's PCP. I have reviewed the patient's CCM visit with my supervising attending, Dr Evette Doffing.  Gaylan Gerold, DO 02/07/2020

## 2020-02-07 NOTE — Chronic Care Management (AMB) (Signed)
  Chronic Care Management   Note  02/07/2020 Name: Sherry [name] MRN: [id number] DOB: 03/21/1945  Successful outreach to patient to follow up on [place]. See image below.     Patient was hospitalized 12/13-12/16 at [facility]  for hypovolemic shock. Other discharge diagnosis include UTI. She completed the antibiotic course while hospitalized.  Patient says the only medicine she does not have is her alendronate that she takes every Friday. She says her drug store was out but will have it before this Friday 12/24. Patient is aware of her clinic appointment tomorrow at 2;45 pm. patient says she usually uses [facility] but her daughter is voicing concern that she is still too weak and is requesting [employer] provide transportation. Advised patient and daughter this CCM RN will ask clinic front desk staff to arrange transportation to 2:45 pm clinic appointment and contact patient tomorrow.  Kelli Churn RN, CCM, [facility] RN Care Manager [phone]

## 2020-02-08 ENCOUNTER — Ambulatory Visit (INDEPENDENT_AMBULATORY_CARE_PROVIDER_SITE_OTHER): Payer: Medicare HMO | Admitting: Internal Medicine

## 2020-02-08 ENCOUNTER — Encounter: Payer: Self-pay | Admitting: Internal Medicine

## 2020-02-08 ENCOUNTER — Other Ambulatory Visit: Payer: Self-pay

## 2020-02-08 VITALS — BP 137/84 | HR 69 | Temp 98.3°F | Ht 61.0 in | Wt 137.5 lb

## 2020-02-08 DIAGNOSIS — M79672 Pain in left foot: Secondary | ICD-10-CM | POA: Diagnosis not present

## 2020-02-08 DIAGNOSIS — R600 Localized edema: Secondary | ICD-10-CM | POA: Diagnosis not present

## 2020-02-08 DIAGNOSIS — R571 Hypovolemic shock: Secondary | ICD-10-CM | POA: Diagnosis not present

## 2020-02-08 DIAGNOSIS — M79671 Pain in right foot: Secondary | ICD-10-CM

## 2020-02-08 NOTE — Patient Instructions (Addendum)
To Mrs. Vader, It was a pleasure meeting you today, I am sorry about your foot pain! Today we checked your pulses and will fit you for stockings today to help with the swelling. Please elevate your legs as much as you can to help decrease the swelling. We will see you back in 4 weeks to see how your pain is doing. Have a happy holiday season!  Sincerely,  Maudie Mercury, MD

## 2020-02-08 NOTE — Progress Notes (Signed)
   CC: Hospital Follow Up/foot pain   HPI:  [name] is a 74 y.o. M/F, with a PMH noted below, who presents to the clinic for hospital follow up and foot pain. To see the management of their acute and chronic conditions, please see the A&P note under the Encounters tab.   Past Medical History:  Diagnosis Date  . Anemia   . Aortic stenosis   . Arthritis   . Bilateral lower extremity edema   . Bradycardia    a. 01/2013 - asymptomatic.  [name] Chronic diastolic heart failure ([place])   . CKD (chronic kidney disease)    a. baseline CKD stage III  . Coronary artery disease   . GERD (gastroesophageal reflux disease)   . Headache(784.0)   . Heart block AV second degree 02/15/2013  . Heart murmur   . Hyperlipidemia   . Hypertension   . Mobitz (type) I (Wenckebach's) atrioventricular block    a. 01/2013 - asymptomatic.  . Morbid obesity ([place])   . Seizure disorder ([place])   . Seizures ([place])   . TIA (transient ischemic attack) 2014   pt stated she had "mini strokes"   Review of Systems:   Review of Systems  Constitutional: Negative for chills, fever, malaise/fatigue and weight loss.  Cardiovascular: Negative for chest pain, palpitations and orthopnea.       Bilateral foot swelling  Gastrointestinal: Negative for abdominal pain, blood in stool, constipation, diarrhea, nausea and vomiting.     Physical Exam:  Vitals:   02/08/20 1340  BP: 137/84  Pulse: 69  Temp: 98.3 F (36.8 C)  TempSrc: Oral  SpO2: 100%  Weight: 137 lb 8 oz (62.4 kg)  Height: 5\' 1"  (1.549 m)   Physical Exam Constitutional:      General: She is not in acute distress.    Appearance: Normal appearance. She is normal weight. She is not toxic-appearing or diaphoretic.     Comments: Sitting comfortably in wheelchair, answers questions appropriately.   Cardiovascular:     Rate and Rhythm: Normal rate and regular rhythm.     Pulses: Normal pulses.     Heart sounds: Murmur heard.  No friction rub. No  gallop.      Comments: Systolic murmur appreciated at the right upper sternal border.  Pulmonary:     Effort: Pulmonary effort is normal.     Breath sounds: Normal breath sounds. No wheezing, rhonchi or rales.  Abdominal:     General: Abdomen is flat. Bowel sounds are normal.     Palpations: Abdomen is soft.  Musculoskeletal:     Comments: Bilateral foot swelling. No purulence, drainage, erythema present. 2+ pulses.  Neurological:     Mental Status: She is alert and oriented to person, place, and time.  Psychiatric:        Mood and Affect: Mood normal.        Behavior: Behavior normal.        Assessment & Plan:   See Encounters Tab for problem based charting.  Patient discussed with Dr. Heber Barnum

## 2020-02-08 NOTE — Progress Notes (Signed)
Internal Medicine Clinic Attending  CCM services provided by the care management provider and their documentation were discussed with Dr. Alfonse Spruce. We reviewed the pertinent findings, urgent action items addressed by the resident and non-urgent items to be addressed by the PCP.  I agree with the assessment, diagnosis, and plan of care documented in the CCM and resident's note.  Axel Filler, MD 02/08/2020

## 2020-02-09 ENCOUNTER — Encounter: Payer: Self-pay | Admitting: Internal Medicine

## 2020-02-09 NOTE — Assessment & Plan Note (Signed)
Patient presents today with continued painful bilateral LE swelling. She had a recent hospital admission, and states that her problem was not addressed during her admission. She is not experiencing SHOB at this time. She underwent a trial of prednisone at her previous visit for possible gout flair, which did not relieve the pain.   A/P She has continued complaints of bilateral foot pain. Her feet are swollen on examination with no appreciable pitting edema on physical examination. Physical examination not consistent with gout as both feet appear to be affected, and the joints of the greater toe are not swollen or tender to the touch. They are not erythematous or purulent or having other concerns for infectious etiology. Given her Hx of heart failure, her symptoms are likely from her underlying condition. Pulses are palpable, but will order ABIs and if appropriate will order compression stockings.  - ABIs, returned 1.01 - Fitted for compression stockings.  - Instructed to keep lower extremities elevated.  - Follow up in 1 month's time to reevaluate her foot pain.  - if her symptoms are unimproved, consider imaging of the feet for possible fracture.

## 2020-02-09 NOTE — Assessment & Plan Note (Signed)
Patient doing well since her discharge. She is currently taking her discharge medicines appropriately. She plans on follow up with cardiology in January of 2022 for her aortic stenosis. Her blood pressures are doing well today. We will continue to hold he anti hypertensive medications.  - Continue current regimen.

## 2020-02-09 NOTE — Progress Notes (Signed)
Internal Medicine Clinic Attending ? ?Case discussed with Dr. Winters  At the time of the visit.  We reviewed the resident?s history and exam and pertinent patient test results.  I agree with the assessment, diagnosis, and plan of care documented in the resident?s note.  ?

## 2020-03-02 ENCOUNTER — Encounter: Payer: Self-pay | Admitting: Physician Assistant

## 2020-03-02 ENCOUNTER — Ambulatory Visit (INDEPENDENT_AMBULATORY_CARE_PROVIDER_SITE_OTHER): Payer: Medicare Other | Admitting: Physician Assistant

## 2020-03-02 ENCOUNTER — Other Ambulatory Visit: Payer: Self-pay

## 2020-03-02 VITALS — BP 128/76 | HR 70 | Ht 61.0 in | Wt 140.0 lb

## 2020-03-02 DIAGNOSIS — I5032 Chronic diastolic (congestive) heart failure: Secondary | ICD-10-CM | POA: Diagnosis not present

## 2020-03-02 DIAGNOSIS — I517 Cardiomegaly: Secondary | ICD-10-CM

## 2020-03-02 DIAGNOSIS — I2510 Atherosclerotic heart disease of native coronary artery without angina pectoris: Secondary | ICD-10-CM | POA: Diagnosis not present

## 2020-03-02 DIAGNOSIS — I350 Nonrheumatic aortic (valve) stenosis: Secondary | ICD-10-CM

## 2020-03-02 MED ORDER — FUROSEMIDE 40 MG PO TABS: 40.0000 mg | ORAL_TABLET | Freq: Every day | ORAL | 3 refills | Status: DC

## 2020-03-02 MED ORDER — POTASSIUM CHLORIDE ER 10 MEQ PO TBCR: 10.0000 meq | EXTENDED_RELEASE_TABLET | Freq: Every day | ORAL | 3 refills | Status: DC

## 2020-03-02 NOTE — Patient Instructions (Addendum)
Medication Instructions:  Your physician has recommended you make the following change in your medication:  1.  START Lasix 40 mg taking 1 daily 2.  START Potassium 10 meq taking 1 daily    *If you need a refill on your cardiac medications before your next appointment, please call your pharmacy*   Lab Work: None ordered today, but get your Primary Care Dr draw some on your visit with him at your follow up appointment   If you have labs (blood work) drawn today and your tests are completely normal, you will receive your results only by: [name] MyChart Message (if you have MyChart) OR . A paper copy in the mail If you have any lab test that is abnormal or we need to change your treatment, we will call you to review the results.   Testing/Procedures: None ordered   Follow-Up: At [facility], you and your health needs are our priority.  As part of our continuing mission to provide you with exceptional heart care, we have created designated Provider Care Teams.  These Care Teams include your primary Cardiologist (physician) and Advanced Practice Providers (APPs -  Physician Assistants and Nurse Practitioners) who all work together to provide you with the care you need, when you need it.  We recommend signing up for the patient portal called "MyChart".  Sign up information is provided on this After Visit Summary.  MyChart is used to connect with patients for Virtual Visits (Telemedicine).  Patients are able to view lab/test results, encounter notes, upcoming appointments, etc.  Non-urgent messages can be sent to your provider as well.   To learn more about what you can do with MyChart, go to [email or web address].    Your next appointment:   6 month(s)  The format for your next appointment:   In Person  Provider:   You may see Sherren Mocha, MD3 or one of the following Advanced Practice Providers on your designated Care Team:    Richardson Dopp, PA-C  Robbie Lis, [place]    Other  Instructions

## 2020-03-02 NOTE — Progress Notes (Signed)
Cardiology Office Note:    Date:  03/02/2020   ID:  Sherry [name], DOB 1945/12/28, MRN [id number]  PCP:  Marty Heck, DO  [place] Cardiologist:  Sherren Mocha, MD  [place] Electrophysiologist:  None   Chief Complaint: hospital follow up   History of Present Illness:    Sherry [name] is a 75 y.o. female with a hx of CAD, severe aortic stenosis, chronic bradycardia, with transient heart block, HTN, hypertophic CM, CKD III, HLD and chronic diastolic CHF seen for hospital follow up.   She tested negative for transthyretin amyloid but her cardiac MRI was suggestive of amyloidosis. She was referred to the [facility] team but did not follow through. She also has a history of transient heart block. A 3 day Zio patch monitor was ordered at her last visit 10/11/19 which showed no high grade AV block.   Admitted 01/2020 for weakness and hypotension. She was over diuresed and then received fluids. Given concern for cardiogenic shock was taken to the cath lab for [place] which showed very low filling pressures, normal cardiac output and moderate AS. Echo showed LVEF of 55-60%, normal RV.   Patient is presented for hospital follow-up with her husband.  Since discharge she has noted bilateral feet swelling with pain.  Patient was evaluated by PCP and did not felt gout symptoms.  Patient had normal ABI.  Patient mostly has sedentary lifestyle.  She denies orthopnea, PND,, chest pain, shortness of breath or melena.  Reports significant swelling in her feet.  Is painful.  Reports using low-sodium diet.  Past Medical History:  Diagnosis Date  . Anemia   . Aortic stenosis   . Arthritis   . Bilateral lower extremity edema   . Bradycardia    a. 01/2013 - asymptomatic.  [name] Chronic diastolic heart failure ([place])   . CKD (chronic kidney disease)    a. baseline CKD stage III  . Coronary artery disease   . GERD (gastroesophageal reflux disease)   . Headache(784.0)   . Heart block AV second  degree 02/15/2013  . Heart murmur   . Hyperlipidemia   . Hypertension   . Mobitz (type) I (Wenckebach's) atrioventricular block    a. 01/2013 - asymptomatic.  . Morbid obesity ([place])   . Seizure disorder ([place])   . Seizures ([place])   . TIA (transient ischemic attack) 2014   pt stated she had "mini strokes"    Past Surgical History:  Procedure Laterality Date  . ABDOMINAL HYSTERECTOMY    . LEFT AND RIGHT HEART CATHETERIZATION WITH CORONARY ANGIOGRAM N/A 05/24/2013   Procedure: LEFT AND RIGHT HEART CATHETERIZATION WITH CORONARY ANGIOGRAM;  Surgeon: Sinclair Grooms, MD;  Location: [facility] CATH LAB;  Service: Cardiovascular;  Laterality: N/A;  . LEFT HEART CATH N/A 02/01/2020   Procedure: Left Heart Cath;  Surgeon: Jolaine Artist, MD;  Location: [facility] CV LAB;  Service: Cardiovascular;  Laterality: N/A;  . RIGHT HEART CATH N/A 02/01/2020   Procedure: RIGHT HEART CATH;  Surgeon: Jolaine Artist, MD;  Location: [facility] CV LAB;  Service: Cardiovascular;  Laterality: N/A;    Current Medications: Current Meds  Medication Sig  . furosemide (LASIX) 40 MG tablet Take 1 tablet (40 mg total) by mouth daily.  . potassium chloride (KLOR-CON) 10 MEQ tablet Take 1 tablet (10 mEq total) by mouth daily.     Allergies:   Ibuprofen   Social History   Socioeconomic History  . Marital status: Married  Spouse name: Not on file  . Number of children: 2  . Years of education: 4  . Highest education level: Not on file  Occupational History  . Occupation: retired  Tobacco Use  . Smoking status: Former Smoker    Types: Cigarettes    Quit date: 06/03/2000    Years since quitting: 19.7  . Smokeless tobacco: Never Used  Vaping Use  . Vaping Use: Never used  Substance and Sexual Activity  . Alcohol use: No    Alcohol/week: 0.0 standard drinks  . Drug use: No  . Sexual activity: Not Currently    Partners: Male  Other Topics Concern  . Not on file  Social History Narrative   Right  handed   One story home   Drinks no caffeine    Social Determinants of Health   Financial Resource Strain: Not on file  Food Insecurity: Not on file  Transportation Needs: Not on file  Physical Activity: Not on file  Stress: Not on file  Social Connections: Not on file     Family History: The patient's family history includes Hypertension in an other family member.   ROS:   Please see the history of present illness.    All other systems reviewed and are negative.   EKGs/Labs/Other Studies Reviewed:    The following studies were reviewed today:   Left Heart Cath  02/01/2020  RIGHT HEART CATH    Conclusion  Findings:  Ao = 108/56 (76) LV = 136/4 RA =  2 RV = 27/7 PA = 30/2 (13) PCW = 5 Fick cardiac output/index = 4.3/2.7 Thermo CO/CI = 4.1/2.5 PVR = 2.0 WU FA sat = 99% PA sat = 66% No RV/LV interaction AoV: mean gradient 44mHG AVA 1.47 cm2 (Fick) 1.38 cm2 (Thermo)  Assessment: 1. Low filling pressures with normal output 2. Moderate aortic stenosis   Plan/Discussion:  Continue hydration.  DGlori Bickers MD   Echo 02/03/2020 1. Limited study to fu on pericardial effusion; normal LV function;  moderate LVH with speckled appearance (consider amyloid); calcified aortic  valve with reduced cusp excursion (full doppler not performed); severe  MAC; moderate pericardial effusion (no  RV diastolic collapse, no respiratory flow variation, IVC not dilated;  findings not suggestive of tamponade physiology); pericardial effusion  similar in size compared to 02/01/20.  2. Left ventricular ejection fraction, by estimation, is 60 to 65%. The  left ventricle has normal function. The left ventricle has no regional  wall motion abnormalities. There is moderate left ventricular hypertrophy.  3. Right ventricular systolic function is normal. The right ventricular  size is normal.  4. Left atrial size was moderately dilated.  5. Moderate pericardial  effusion.  6. Severe mitral annular calcification.  7. The aortic valve is calcified.   EKG:  EKG is not ordered today.    Recent Labs: 01/31/2020: B Natriuretic Peptide 167.9; TSH 4.577 02/01/2020: ALT 42 02/02/2020: Magnesium 2.5 02/03/2020: BUN 54; Creatinine, Ser 1.45; Hemoglobin 10.0; Platelets 301; Potassium 4.9; Sodium 132  Recent Lipid Panel    Component Value Date/Time   CHOL 189 01/27/2020 1032   TRIG 104 01/27/2020 1032   HDL 46 01/27/2020 1032   CHOLHDL 4.1 01/27/2020 1032   VLDL 21 01/27/2020 1032   LDLCALC 122 (H) 01/27/2020 1032     Physical Exam:    VS:  BP 128/76   Pulse 70   Ht _0  (1.549 m)   Wt 140 lb (63.5 kg)   SpO2 98%  BMI 26.45 kg/m     Wt Readings from Last 3 Encounters:  03/02/20 140 lb (63.5 kg)  02/08/20 137 lb 8 oz (62.4 kg)  02/03/20 146 lb 6.2 oz (66.4 kg)     GEN: Well nourished, well developed in no acute distress HEENT: Normal NECK: No JVD; No carotid bruits LYMPHATICS: No lymphadenopathy CARDIAC: RRR, 3/6 systolic  murmurs, rubs, gallops RESPIRATORY:  Clear to auscultation without rales, wheezing or rhonchi  ABDOMEN: Soft, non-tender, non-distended MUSCULOSKELETAL: 2 + foot edema; No deformity  SKIN: Warm and dry NEUROLOGIC:  Alert and oriented x 3 PSYCHIATRIC:  Normal affect   ASSESSMENT AND PLAN:    1. Bilateral feet edema with history of chronic diastolic heart failure I have reviewed patient with Dr. Burt Knack who is familiarize with recent admission.  Patient was on higher dose of Lasix previously which was discontinued at discharge.  Dr. Burt Knack felt that her symptoms could be due to diastolic dysfunction.  Recommended to start Lasix 40 mg daily with K-Dur 10 milliequivalent.  Patient has follow-up at internal medicine clinic in 2 weeks where she will get BMET.  Declined to come here for blood work as he has an appointment.  2. Aortic stenosis -Echo suggests paradoxical low-flow low gradient severe AS. - No  indication for TVAR per Dr. Burt Knack   3. Severe LVH - She tested negative for transthyretin amyloid but her cardiac MRI was suggestive of amyloidosis in past.  - Most recent blood work showed elevated kappa and lambda free light chains.  Protein electrophoresis showed low albumin.     4. CKD III - Will need BMET in 2 weeks. Pt perfers to get it done during IM visit in 2 weeks.   Medication Adjustments/Labs and Tests Ordered: Current medicines are reviewed at length with the patient today.  Concerns regarding medicines are outlined above.  No orders of the defined types were placed in this encounter.  Meds ordered this encounter  Medications  . furosemide (LASIX) 40 MG tablet    Sig: Take 1 tablet (40 mg total) by mouth daily.    Dispense:  90 tablet    Refill:  3  . potassium chloride (KLOR-CON) 10 MEQ tablet    Sig: Take 1 tablet (10 mEq total) by mouth daily.    Dispense:  90 tablet    Refill:  3    Patient Instructions  Medication Instructions:  Your physician has recommended you make the following change in your medication:  1.  START Lasix 40 mg taking 1 daily 2.  START Potassium 10 meq taking 1 daily    *If you need a refill on your cardiac medications before your next appointment, please call your pharmacy*   Lab Work: None ordered today, but get your Primary Care Dr draw some on your visit with him at your follow up appointment   If you have labs (blood work) drawn today and your tests are completely normal, you will receive your results only by: [name] MyChart Message (if you have MyChart) OR . A paper copy in the mail If you have any lab test that is abnormal or we need to change your treatment, we will call you to review the results.   Testing/Procedures: None ordered   Follow-Up: At [facility], you and your health needs are our priority.  As part of our continuing mission to provide you with exceptional heart care, we have created designated Provider Care  Teams.  These Care Teams include your primary Cardiologist (  physician) and Advanced Practice Providers (APPs -  Physician Assistants and Nurse Practitioners) who all work together to provide you with the care you need, when you need it.  We recommend signing up for the patient portal called "MyChart".  Sign up information is provided on this After Visit Summary.  MyChart is used to connect with patients for Virtual Visits (Telemedicine).  Patients are able to view lab/test results, encounter notes, upcoming appointments, etc.  Non-urgent messages can be sent to your provider as well.   To learn more about what you can do with MyChart, go to [email or web address].    Your next appointment:   6 month(s)  The format for your next appointment:   In Person  Provider:   You may see Sherren Mocha, MD3 or one of the following Advanced Practice Providers on your designated Care Team:    Richardson Dopp, PA-C  Robbie Lis, PA-C    Other Instructions      Signed, Leanor Kail, [place]  03/02/2020 4:07 PM    [facility]

## 2020-03-22 ENCOUNTER — Telehealth: Payer: Self-pay | Admitting: Neurology

## 2020-03-22 ENCOUNTER — Other Ambulatory Visit: Payer: Self-pay

## 2020-03-22 MED ORDER — ZONISAMIDE 100 MG PO CAPS: ORAL_CAPSULE | ORAL | 3 refills | Status: DC

## 2020-03-22 NOTE — Telephone Encounter (Signed)
Patient called in stating she is almost out of her Zonisamide and needs a refill.

## 2020-03-22 NOTE — Telephone Encounter (Signed)
Sent to aquino to sign for refill pended.

## 2020-04-15 ENCOUNTER — Other Ambulatory Visit: Payer: Self-pay | Admitting: Internal Medicine

## 2020-04-15 DIAGNOSIS — M810 Age-related osteoporosis without current pathological fracture: Secondary | ICD-10-CM

## 2020-05-23 ENCOUNTER — Ambulatory Visit (INDEPENDENT_AMBULATORY_CARE_PROVIDER_SITE_OTHER): Payer: Medicare Other | Admitting: Neurology

## 2020-05-23 ENCOUNTER — Other Ambulatory Visit: Payer: Self-pay

## 2020-05-23 ENCOUNTER — Encounter: Payer: Self-pay | Admitting: Neurology

## 2020-05-23 VITALS — BP 208/84 | HR 76

## 2020-05-23 DIAGNOSIS — R29898 Other symptoms and signs involving the musculoskeletal system: Secondary | ICD-10-CM

## 2020-05-23 DIAGNOSIS — G40209 Localization-related (focal) (partial) symptomatic epilepsy and epileptic syndromes with complex partial seizures, not intractable, without status epilepticus: Secondary | ICD-10-CM | POA: Diagnosis not present

## 2020-05-23 MED ORDER — ZONISAMIDE 100 MG PO CAPS: ORAL_CAPSULE | ORAL | 3 refills | Status: DC

## 2020-05-23 MED ORDER — PHENOBARBITAL 64.8 MG PO TABS
129.6000 mg | ORAL_TABLET | Freq: Every day | ORAL | 3 refills | Status: DC
Start: 2020-05-23 — End: 2020-10-17

## 2020-05-23 NOTE — Progress Notes (Addendum)
NEUROLOGY FOLLOW UP OFFICE NOTE  Kieshia Gallis [id number] 1945-09-19  HISTORY OF PRESENT ILLNESS: I had the pleasure of seeing Sherry [name] in follow-up in the neurology clinic on 05/23/2020. She is again accompanied by her husband who helps supplement the history today. The patient was last seen 7 months ago for right temporal lobe epilepsy. No convulsions since age 75, her last partial seizure was in 2018. Since her last visit, Zonisamide has been reduced to '200mg'$  qhs due to worsening GFR. Her GFR has been in the 40s since 2015, she has been on Zonisamide since 2015 started at [facility]. Last GFR in 01/2020 was 38. She is also on Phenobarbital 64.'8mg'$  2 tabs qhs. No side effects on medications. She and her husband deny any staring/unresponsive episodes, gaps in time, olfactory/gustatory hallucinations, myoclonic jerks. She has rare headaches, mostly surrounding her left eye. No dizziness. She was admitted for hypovolemic shock secondary to overdiuresis, AKI, UTI, severe aortic stenosis and LVH. She reports that since then, she has been unable to walk. On review of notes, she has been reporting leg weakness since at least 2019 but would like to increase mobility at home (she has a walker at home). She uses a bedpan, no incontinence. She has knee and back pain. Sleep and mood are good.   She had a head CT in 01/2020 which did not show any acute changes. There were chronic small vessel infarcts within the left cerebral white matter, moderate chronic microvascular disease, mild diffuse atrophy.    History on Initial Assessment 05/20/2016: This is a pleasant 75 yo RH woman with a history of hypertension, hyperlipidemia, AV block, CAD, and seizures. Records from [facility] were reviewed. She started having seizures in the 65s (age 40s), initially she was having generalized convulsions often preceded by a sensation of severe nausea, "sick to stomach," followed by eyes rolling up, shaking of both arms  and legs with tongue bite. They were initially occurring every 2 weeks while on Dilantin, none since adding Phenobarbital. They report the last GTC was probably in her 93s (>10 years ago). Family would also note episodes of unresponsiveness where her head drops forward slightly and she does not respond. She would be amnestic of these and feel like she woke up from sleep. Her daughter previously reported she seemed confused afterwards. There is note that these resolved after phenobarbital reduction in 2013, however her husband reports occasional episodes where he would be talking to her, then he comes back in the room and she would look at him like they did not talk earlier, she would not understand and say "huh?" and he asks if she heard him. He last noticed this around 1.5 months ago, prior to that was 7-8 months ago. She continues to have the sensation of a sickening feeling every few months or so, these always seem to occur when she is in the bath. She had one 2 weeks ago, these last up to a minute, she denies any confusion or focal symptoms. She denies any olfactory/gustatory hallucinations, deja vu, focal numbness/tingling/weakness, myoclonic jerks. She notices her hand twitches sometimes. Her husband denies any nocturnal seizures.  She is currently taking Phenobarbital 64.'8mg'$  2 tablets daily and Zonisamide '300mg'$  qhs. She was previously drowsy on higher doses of Phenobarbital, and with reduction of dose, she currently denies any side effects to her medications. She denies any headaches, dizziness, diplopia, dysarthria/dysphagia, neck/back pain, bladder dysfunction. She has constipation. She needs a walker to ambulate and denies any falls. She  denies any focal symptoms but reports tingling in both hands and weakness in both legs, no pain. She reports sleep is good, and reports good compliance to medications. She reports a history of migraines, but these have resolved.  Epilepsy Risk Factors:  A maternal  cousin had seizures when younger. Otherwise she had a normal birth and early development.  There is no history of febrile convulsions, CNS infections such as meningitis/encephalitis, significant traumatic brain injury, neurosurgical procedures.  Prior AEDs: Dilantin (ineffective) Laboratory Data: Phenobarbital level 03/13/16 - 28. BMP showed a creatinine of 1.32, BUN 28; vitamin D level in 09/2015 was 25.9 EEG per [facility] notes: mild diffuse slowing on routine EEG MRI per [facility] notes: showed evidence of old strokes 1-hour EEG: Occasional right temporal slowing, frequent right frontotemporal epileptiform discharges   PAST MEDICAL HISTORY: Past Medical History:  Diagnosis Date  . Anemia   . Aortic stenosis   . Arthritis   . Bilateral lower extremity edema   . Bradycardia    a. 01/2013 - asymptomatic.  [name] Chronic diastolic heart failure ([place])   . CKD (chronic kidney disease)    a. baseline CKD stage III  . Coronary artery disease   . GERD (gastroesophageal reflux disease)   . Headache(784.0)   . Heart block AV second degree 02/15/2013  . Heart murmur   . Hyperlipidemia   . Hypertension   . Mobitz (type) I (Wenckebach's) atrioventricular block    a. 01/2013 - asymptomatic.  . Morbid obesity ([place])   . Seizure disorder ([place])   . Seizures ([place])   . TIA (transient ischemic attack) 2014   pt stated she had "mini strokes"    MEDICATIONS: Current Outpatient Medications on File Prior to Visit  Medication Sig Dispense Refill  . alendronate (FOSAMAX) 70 MG tablet TAKE 1/2 TABLET BY MOUTH ONCE WEEKLY 6 tablet 0  . aspirin 81 MG EC tablet Take 81 mg by mouth daily.    . Calcium Carb-Cholecalciferol (CALCIUM 500 +D) 500-400 MG-UNIT TABS Take 1 tablet by mouth 2 (two) times daily. 60 tablet 2  . furosemide (LASIX) 40 MG tablet Take 1 tablet (40 mg total) by mouth daily. 90 tablet 3  . lidocaine (LIDODERM) 5 % Place 1 patch onto the skin daily. Remove & Discard patch within 12 hours or as  directed by MD 30 patch 0  . mupirocin ointment (BACTROBAN) 2 % Place 1 application into the nose 2 (two) times daily. 22 g 0  . PHENobarbital (LUMINAL) 64.8 MG tablet Take 2 tablets (129.6 mg total) by mouth at bedtime. 60 tablet 0  . potassium chloride (KLOR-CON) 10 MEQ tablet Take 1 tablet (10 mEq total) by mouth daily. 90 tablet 3  . rosuvastatin (CRESTOR) 10 MG tablet Take 1 tablet (10 mg total) by mouth daily. 60 tablet 0  . zonisamide (ZONEGRAN) 100 MG capsule Take 2 capsules every night 180 capsule 3   No current facility-administered medications on file prior to visit.    ALLERGIES: Allergies  Allergen Reactions  . Ibuprofen Other (See Comments)    Dr advised pt not to take ibuprofen    FAMILY HISTORY: Family History  Problem Relation Age of Onset  . Hypertension Other     SOCIAL HISTORY: Social History   Socioeconomic History  . Marital status: Married    Spouse name: Not on file  . Number of children: 2  . Years of education: 4  . Highest education level: Not on file  Occupational History  . Occupation:  retired  Tobacco Use  . Smoking status: Former Smoker    Types: Cigarettes    Quit date: 06/03/2000    Years since quitting: 19.9  . Smokeless tobacco: Never Used  Vaping Use  . Vaping Use: Never used  Substance and Sexual Activity  . Alcohol use: No    Alcohol/week: 0.0 standard drinks  . Drug use: No  . Sexual activity: Not Currently    Partners: Male  Other Topics Concern  . Not on file  Social History Narrative   Right handed   One story home   Drinks no caffeine    Social Determinants of Health   Financial Resource Strain: Not on file  Food Insecurity: Not on file  Transportation Needs: Not on file  Physical Activity: Not on file  Stress: Not on file  Social Connections: Not on file  Intimate Partner Violence: Not on file     PHYSICAL EXAM: Vitals:   05/23/20 0945 05/23/20 0953  BP: (!) 210/90 (!) 216/102  Pulse: 76   SpO2: 99%     General: No acute distress, sitting on motorized wheelchair Head:  Normocephalic/atraumatic Skin/Extremities: No rash, no edema Neurological Exam: alert and awake. No aphasia or dysarthria. Fund of knowledge is appropriate.  Attention and concentration are normal.   Cranial nerves: Pupils equal, round. Extraocular movements intact with no nystagmus. Visual fields full.  No facial asymmetry.  Motor: Bulk and tone normal, muscle strength 5/5 on both UE and proximal LE, 4-/5 bilateral ankle flexion. Patchy sensory changes, reporting decreased cold on left V1,V2, left UE. Decreased vibration sense in both LE, L>R. Decreased pin on left UE and LE.  Finger to nose testing intact.  Gait not tested, sitting on motorized wheelchair.    IMPRESSION: This is a pleasant 75 yo RH woman with a history of  hypertension, hyperlipidemia, AV block, CAD, and right temporal lobe epilepsy. No convulsions since age 60, no partial seizures since 2018. She has had worsening GFR over the years, Zonisamide further reduced to '200mg'$  qhs in December 2021. She is also on Phenobarbital 64.'8mg'$  2 tabs qhs. Refills sent. She has baseline leg weakness but reports worsening since her last hospitalization, home physical therapy will be ordered to leg strengthening exercises. BP significantly elevated, 208/84 in the office, she is asymptomatic, continue to monitor and follow-up with PCP and Cardiology. Follow-up in 6-8 months, call for any changes.   Thank you for allowing me to participate in her care.  Please do not hesitate to call for any questions or concerns.   Ellouise Newer, M.D.   CC: Dr. Sharon Seller, Dr. Burt Knack, Leanor Kail, [place]

## 2020-05-23 NOTE — Patient Instructions (Signed)
1. Continue Zonisamide '100mg'$ : Take 2 caps every night  2. Continue Phenobarbital 64.'8mg'$ : take 2 tabs every night  3. Refer for home PT for leg strengthening exercises  4. Follow-up in 6-8 months, call for any changes   Seizure Precautions: 1. If medication has been prescribed for you to prevent seizures, take it exactly as directed.  Do not stop taking the medicine without talking to your doctor first, even if you have not had a seizure in a long time.   2. Avoid activities in which a seizure would cause danger to yourself or to others.  Don't operate dangerous machinery, swim alone, or climb in high or dangerous places, such as on ladders, roofs, or girders.  Do not drive unless your doctor says you may.  3. If you have any warning that you may have a seizure, lay down in a safe place where you can't hurt yourself.    4.  No driving for 6 months from last seizure, as per [facility].   Please refer to the following link on the [place] website for more information: http://www.epilepsyfoundation.org/answerplace/Social/driving/drivingu.cfm   5.  Maintain good sleep hygiene.   6.  Contact your doctor if you have any problems that may be related to the medicine you are taking.  7.  Call 911 and bring the patient back to the ED if:        A.  The seizure lasts longer than 5 minutes.       B.  The patient doesn't awaken shortly after the seizure  C.  The patient has new problems such as difficulty seeing, speaking or moving  D.  The patient was injured during the seizure  E.  The patient has a temperature over 102 F (39C)  F.  The patient vomited and now is having trouble breathing

## 2020-05-26 ENCOUNTER — Telehealth: Payer: Self-pay

## 2020-05-26 NOTE — Telephone Encounter (Signed)
RTC, patient states she would like an appt to discuss the possibility of getting in home PT for foot and leg pain. Appt made for 06/05/20 @ 0945 / red team / Dr. Britt Bolognese, RN,BSN

## 2020-05-26 NOTE — Telephone Encounter (Signed)
Great, thank you!

## 2020-05-26 NOTE — Telephone Encounter (Signed)
Pls contact pt [phone]

## 2020-05-27 DIAGNOSIS — I350 Nonrheumatic aortic (valve) stenosis: Secondary | ICD-10-CM | POA: Diagnosis not present

## 2020-05-27 DIAGNOSIS — H9313 Tinnitus, bilateral: Secondary | ICD-10-CM | POA: Diagnosis not present

## 2020-05-27 DIAGNOSIS — E785 Hyperlipidemia, unspecified: Secondary | ICD-10-CM | POA: Diagnosis not present

## 2020-05-27 DIAGNOSIS — M47816 Spondylosis without myelopathy or radiculopathy, lumbar region: Secondary | ICD-10-CM | POA: Diagnosis not present

## 2020-05-27 DIAGNOSIS — I700 Atherosclerosis of aorta: Secondary | ICD-10-CM | POA: Diagnosis not present

## 2020-05-27 DIAGNOSIS — N183 Chronic kidney disease, stage 3 unspecified: Secondary | ICD-10-CM | POA: Diagnosis not present

## 2020-05-27 DIAGNOSIS — M5030 Other cervical disc degeneration, unspecified cervical region: Secondary | ICD-10-CM | POA: Diagnosis not present

## 2020-05-27 DIAGNOSIS — M24562 Contracture, left knee: Secondary | ICD-10-CM | POA: Diagnosis not present

## 2020-05-27 DIAGNOSIS — D631 Anemia in chronic kidney disease: Secondary | ICD-10-CM | POA: Diagnosis not present

## 2020-05-27 DIAGNOSIS — I2510 Atherosclerotic heart disease of native coronary artery without angina pectoris: Secondary | ICD-10-CM | POA: Diagnosis not present

## 2020-05-27 DIAGNOSIS — H269 Unspecified cataract: Secondary | ICD-10-CM | POA: Diagnosis not present

## 2020-05-27 DIAGNOSIS — M810 Age-related osteoporosis without current pathological fracture: Secondary | ICD-10-CM | POA: Diagnosis not present

## 2020-05-27 DIAGNOSIS — M79672 Pain in left foot: Secondary | ICD-10-CM | POA: Diagnosis not present

## 2020-05-27 DIAGNOSIS — M79671 Pain in right foot: Secondary | ICD-10-CM | POA: Diagnosis not present

## 2020-05-27 DIAGNOSIS — I5032 Chronic diastolic (congestive) heart failure: Secondary | ICD-10-CM | POA: Diagnosis not present

## 2020-05-27 DIAGNOSIS — M24561 Contracture, right knee: Secondary | ICD-10-CM | POA: Diagnosis not present

## 2020-05-27 DIAGNOSIS — I130 Hypertensive heart and chronic kidney disease with heart failure and stage 1 through stage 4 chronic kidney disease, or unspecified chronic kidney disease: Secondary | ICD-10-CM | POA: Diagnosis not present

## 2020-05-27 DIAGNOSIS — G8929 Other chronic pain: Secondary | ICD-10-CM | POA: Diagnosis not present

## 2020-05-27 DIAGNOSIS — I441 Atrioventricular block, second degree: Secondary | ICD-10-CM | POA: Diagnosis not present

## 2020-05-27 DIAGNOSIS — M1990 Unspecified osteoarthritis, unspecified site: Secondary | ICD-10-CM | POA: Diagnosis not present

## 2020-05-27 DIAGNOSIS — K219 Gastro-esophageal reflux disease without esophagitis: Secondary | ICD-10-CM | POA: Diagnosis not present

## 2020-05-29 ENCOUNTER — Telehealth: Payer: Self-pay

## 2020-05-29 NOTE — Telephone Encounter (Signed)
Home PT verbal orders for transfer and standing and balance 1 time a week for 5 weeks and and 2 times a week for 4 weeks,

## 2020-06-02 DIAGNOSIS — D631 Anemia in chronic kidney disease: Secondary | ICD-10-CM | POA: Diagnosis not present

## 2020-06-02 DIAGNOSIS — M24562 Contracture, left knee: Secondary | ICD-10-CM | POA: Diagnosis not present

## 2020-06-02 DIAGNOSIS — H269 Unspecified cataract: Secondary | ICD-10-CM | POA: Diagnosis not present

## 2020-06-02 DIAGNOSIS — I700 Atherosclerosis of aorta: Secondary | ICD-10-CM | POA: Diagnosis not present

## 2020-06-02 DIAGNOSIS — I2510 Atherosclerotic heart disease of native coronary artery without angina pectoris: Secondary | ICD-10-CM | POA: Diagnosis not present

## 2020-06-02 DIAGNOSIS — I130 Hypertensive heart and chronic kidney disease with heart failure and stage 1 through stage 4 chronic kidney disease, or unspecified chronic kidney disease: Secondary | ICD-10-CM | POA: Diagnosis not present

## 2020-06-02 DIAGNOSIS — I5032 Chronic diastolic (congestive) heart failure: Secondary | ICD-10-CM | POA: Diagnosis not present

## 2020-06-02 DIAGNOSIS — M810 Age-related osteoporosis without current pathological fracture: Secondary | ICD-10-CM | POA: Diagnosis not present

## 2020-06-02 DIAGNOSIS — N183 Chronic kidney disease, stage 3 unspecified: Secondary | ICD-10-CM | POA: Diagnosis not present

## 2020-06-02 DIAGNOSIS — I441 Atrioventricular block, second degree: Secondary | ICD-10-CM | POA: Diagnosis not present

## 2020-06-02 DIAGNOSIS — G8929 Other chronic pain: Secondary | ICD-10-CM | POA: Diagnosis not present

## 2020-06-02 DIAGNOSIS — K219 Gastro-esophageal reflux disease without esophagitis: Secondary | ICD-10-CM | POA: Diagnosis not present

## 2020-06-02 DIAGNOSIS — M1990 Unspecified osteoarthritis, unspecified site: Secondary | ICD-10-CM | POA: Diagnosis not present

## 2020-06-02 DIAGNOSIS — M47816 Spondylosis without myelopathy or radiculopathy, lumbar region: Secondary | ICD-10-CM | POA: Diagnosis not present

## 2020-06-02 DIAGNOSIS — H9313 Tinnitus, bilateral: Secondary | ICD-10-CM | POA: Diagnosis not present

## 2020-06-02 DIAGNOSIS — M5030 Other cervical disc degeneration, unspecified cervical region: Secondary | ICD-10-CM | POA: Diagnosis not present

## 2020-06-02 DIAGNOSIS — M79672 Pain in left foot: Secondary | ICD-10-CM | POA: Diagnosis not present

## 2020-06-02 DIAGNOSIS — M24561 Contracture, right knee: Secondary | ICD-10-CM | POA: Diagnosis not present

## 2020-06-02 DIAGNOSIS — I350 Nonrheumatic aortic (valve) stenosis: Secondary | ICD-10-CM | POA: Diagnosis not present

## 2020-06-02 DIAGNOSIS — M79671 Pain in right foot: Secondary | ICD-10-CM | POA: Diagnosis not present

## 2020-06-02 DIAGNOSIS — E785 Hyperlipidemia, unspecified: Secondary | ICD-10-CM | POA: Diagnosis not present

## 2020-06-05 ENCOUNTER — Ambulatory Visit (INDEPENDENT_AMBULATORY_CARE_PROVIDER_SITE_OTHER): Payer: Medicare Other | Admitting: Internal Medicine

## 2020-06-05 ENCOUNTER — Encounter: Payer: Self-pay | Admitting: Internal Medicine

## 2020-06-05 VITALS — BP 179/72 | HR 43 | Temp 98.1°F

## 2020-06-05 DIAGNOSIS — M810 Age-related osteoporosis without current pathological fracture: Secondary | ICD-10-CM

## 2020-06-05 DIAGNOSIS — I350 Nonrheumatic aortic (valve) stenosis: Secondary | ICD-10-CM

## 2020-06-05 DIAGNOSIS — I10 Essential (primary) hypertension: Secondary | ICD-10-CM

## 2020-06-05 NOTE — Patient Instructions (Signed)
Thank you, [name] for allowing [place] to provide your care today. Today we discussed Blood pressure.    I have ordered the following labs for you:   Lab Orders     BMP8+Anion Gap     Vitamin D (25 hydroxy)   Tests ordered today:  none  Referrals ordered today:   Referral Orders  No referral(s) requested today     Medication Changes:   Take Lisinopril 20 mg daily (take half of your 40 mg tablet each day)  Instructions:   - Please remember to take half a tablet of your lisinopril daily.   Follow up: 1 month   Remember:    Should you have any questions or concerns please call the internal medicine clinic at [phone].     Marianna Payment, D.O. [place]

## 2020-06-05 NOTE — Progress Notes (Signed)
CC: HTN  HPI:  [name] is a 75 y.o. female with a past medical history stated below and presents today for HTN. Please see problem based assessment and plan for additional details.  Past Medical History:  Diagnosis Date  . Anemia   . Aortic stenosis   . Arthritis   . Bilateral lower extremity edema   . Bradycardia    a. 01/2013 - asymptomatic.  [name] Chronic diastolic heart failure ([place])   . CKD (chronic kidney disease)    a. baseline CKD stage III  . Coronary artery disease   . GERD (gastroesophageal reflux disease)   . Headache(784.0)   . Heart block AV second degree 02/15/2013  . Heart murmur   . Hyperlipidemia   . Hypertension   . Mobitz (type) I (Wenckebach's) atrioventricular block    a. 01/2013 - asymptomatic.  . Morbid obesity ([place])   . Seizure disorder ([place])   . Seizures ([place])   . TIA (transient ischemic attack) 2014   pt stated she had "mini strokes"    Current Outpatient Medications on File Prior to Visit  Medication Sig Dispense Refill  . alendronate (FOSAMAX) 70 MG tablet TAKE 1/2 TABLET BY MOUTH ONCE WEEKLY 6 tablet 0  . aspirin 81 MG EC tablet Take 81 mg by mouth daily.    . Calcium Carb-Cholecalciferol (CALCIUM 500 +D) 500-400 MG-UNIT TABS Take 1 tablet by mouth 2 (two) times daily. 60 tablet 2  . furosemide (LASIX) 40 MG tablet Take 1 tablet (40 mg total) by mouth daily. 90 tablet 3  . lidocaine (LIDODERM) 5 % Place 1 patch onto the skin daily. Remove & Discard patch within 12 hours or as directed by MD 30 patch 0  . mupirocin ointment (BACTROBAN) 2 % Place 1 application into the nose 2 (two) times daily. 22 g 0  . PHENobarbital (LUMINAL) 64.8 MG tablet Take 2 tablets (129.6 mg total) by mouth at bedtime. 180 tablet 3  . potassium chloride (KLOR-CON) 10 MEQ tablet Take 1 tablet (10 mEq total) by mouth daily. 90 tablet 3  . rosuvastatin (CRESTOR) 10 MG tablet Take 1 tablet (10 mg total) by mouth daily. 60 tablet 0  . zonisamide (ZONEGRAN) 100  MG capsule Take 2 capsules every night 180 capsule 3   No current facility-administered medications on file prior to visit.    Family History  Problem Relation Age of Onset  . Hypertension Other     Social History   Socioeconomic History  . Marital status: Married    Spouse name: Not on file  . Number of children: 2  . Years of education: 4  . Highest education level: Not on file  Occupational History  . Occupation: retired  Tobacco Use  . Smoking status: Former Smoker    Types: Cigarettes    Quit date: 06/03/2000    Years since quitting: 20.0  . Smokeless tobacco: Never Used  Vaping Use  . Vaping Use: Never used  Substance and Sexual Activity  . Alcohol use: No    Alcohol/week: 0.0 standard drinks  . Drug use: No  . Sexual activity: Not Currently    Partners: Male  Other Topics Concern  . Not on file  Social History Narrative   Right handed   One story home   Drinks no caffeine    Social Determinants of Health   Financial Resource Strain: Not on file  Food Insecurity: Not on file  Transportation Needs: Not on file  Physical Activity: Not  on file  Stress: Not on file  Social Connections: Not on file  Intimate Partner Violence: Not on file    Review of Systems: ROS negative except for what is noted on the assessment and plan.  Vitals:   06/05/20 0916 06/05/20 0927  BP: (!) 200/59 (!) 179/72  Pulse: (!) 44 (!) 43  Temp: 98.1 F (36.7 C)   TempSrc: Oral   SpO2: 100%     Physical Exam: Gen: A&O x3 and in no apparent distress, well appearing and nourished. HEENT: Head - normocephalic, atraumatic. Eye -  visual acuity grossly intact, conjunctiva clear, sclera non-icteric, EOM intact. Mouth - No obvious caries or periodontal disease. Neck: no obvious masses or nodules, AROM intact. CV: RRR, 4/6 systolic murmur Resp: Clear to ascultation bilaterally  Abd: BS (+) x4, soft, non-tender, without obvious hepatosplenomegaly or masses MSK: Grossly normal AROM  and strength x4 extremities. Skin: good skin turgor, no rashes, unusual bruising, or prominent lesions.  Neuro: No focal deficits, grossly normal sensation and coordination.  Psych: Oriented x3 and responding appropriately. Intact recent and remote memory, normal mood, judgement, affect , and insight.    Assessment & Plan:   See Encounters Tab for problem based charting.  Patient discussed with Dr. Hilbert Odor, D.O. [facility], PGY-2 Pager: [phone], Phone: [phone] Date 06/05/2020 Time 2:17 PM

## 2020-06-05 NOTE — Assessment & Plan Note (Addendum)
Patient presents for reevaluation of her BP. Her past blood pressures have been:  BP Readings from Last 3 Encounters:  06/05/20 (!) 179/72  05/23/20 (!) 208/84  03/02/20 128/76    She is only on furosemide 40 mg qd. She was taken off of lisinopril and amlodipine when she was recently discharged from the hospital due to hypotension.   She denies any pre-syncope, chest pain, shortness of breath, or LE swelling today.  Plan: - Restart lisinopril 20 mg daily and titrate up to 40 mg as tolerated.  - Will need to avoid hypotension due to her severe aortic stenosis. - BMP today.

## 2020-06-06 LAB — VITAMIN D 25 HYDROXY (VIT D DEFICIENCY, FRACTURES): Vit D, 25-Hydroxy: 16.4 ng/mL — ABNORMAL LOW (ref 30.0–100.0)

## 2020-06-06 LAB — BMP8+ANION GAP
Anion Gap: 18 mmol/L (ref 10.0–18.0)
BUN/Creatinine Ratio: 41 — ABNORMAL HIGH (ref 12–28)
BUN: 47 mg/dL — ABNORMAL HIGH (ref 8–27)
CO2: 20 mmol/L (ref 20–29)
Calcium: 8.4 mg/dL — ABNORMAL LOW (ref 8.7–10.3)
Chloride: 101 mmol/L (ref 96–106)
Creatinine, Ser: 1.14 mg/dL — ABNORMAL HIGH (ref 0.57–1.00)
Glucose: 79 mg/dL (ref 65–99)
Potassium: 4.4 mmol/L (ref 3.5–5.2)
Sodium: 139 mmol/L (ref 134–144)
eGFR: 51 mL/min/{1.73_m2} — ABNORMAL LOW (ref >59)

## 2020-06-07 DIAGNOSIS — H269 Unspecified cataract: Secondary | ICD-10-CM | POA: Diagnosis not present

## 2020-06-07 DIAGNOSIS — I441 Atrioventricular block, second degree: Secondary | ICD-10-CM | POA: Diagnosis not present

## 2020-06-07 DIAGNOSIS — M24561 Contracture, right knee: Secondary | ICD-10-CM | POA: Diagnosis not present

## 2020-06-07 DIAGNOSIS — M47816 Spondylosis without myelopathy or radiculopathy, lumbar region: Secondary | ICD-10-CM | POA: Diagnosis not present

## 2020-06-07 DIAGNOSIS — D631 Anemia in chronic kidney disease: Secondary | ICD-10-CM | POA: Diagnosis not present

## 2020-06-07 DIAGNOSIS — G8929 Other chronic pain: Secondary | ICD-10-CM | POA: Diagnosis not present

## 2020-06-07 DIAGNOSIS — N183 Chronic kidney disease, stage 3 unspecified: Secondary | ICD-10-CM | POA: Diagnosis not present

## 2020-06-07 DIAGNOSIS — I350 Nonrheumatic aortic (valve) stenosis: Secondary | ICD-10-CM | POA: Diagnosis not present

## 2020-06-07 DIAGNOSIS — E785 Hyperlipidemia, unspecified: Secondary | ICD-10-CM | POA: Diagnosis not present

## 2020-06-07 DIAGNOSIS — M24562 Contracture, left knee: Secondary | ICD-10-CM | POA: Diagnosis not present

## 2020-06-07 DIAGNOSIS — M1990 Unspecified osteoarthritis, unspecified site: Secondary | ICD-10-CM | POA: Diagnosis not present

## 2020-06-07 DIAGNOSIS — H9313 Tinnitus, bilateral: Secondary | ICD-10-CM | POA: Diagnosis not present

## 2020-06-07 DIAGNOSIS — M810 Age-related osteoporosis without current pathological fracture: Secondary | ICD-10-CM | POA: Diagnosis not present

## 2020-06-07 DIAGNOSIS — I2510 Atherosclerotic heart disease of native coronary artery without angina pectoris: Secondary | ICD-10-CM | POA: Diagnosis not present

## 2020-06-07 DIAGNOSIS — I130 Hypertensive heart and chronic kidney disease with heart failure and stage 1 through stage 4 chronic kidney disease, or unspecified chronic kidney disease: Secondary | ICD-10-CM | POA: Diagnosis not present

## 2020-06-07 DIAGNOSIS — I700 Atherosclerosis of aorta: Secondary | ICD-10-CM | POA: Diagnosis not present

## 2020-06-07 DIAGNOSIS — M79671 Pain in right foot: Secondary | ICD-10-CM | POA: Diagnosis not present

## 2020-06-07 DIAGNOSIS — K219 Gastro-esophageal reflux disease without esophagitis: Secondary | ICD-10-CM | POA: Diagnosis not present

## 2020-06-07 DIAGNOSIS — I5032 Chronic diastolic (congestive) heart failure: Secondary | ICD-10-CM | POA: Diagnosis not present

## 2020-06-07 DIAGNOSIS — M79672 Pain in left foot: Secondary | ICD-10-CM | POA: Diagnosis not present

## 2020-06-07 DIAGNOSIS — M5030 Other cervical disc degeneration, unspecified cervical region: Secondary | ICD-10-CM | POA: Diagnosis not present

## 2020-06-07 NOTE — Progress Notes (Signed)
Internal Medicine Clinic Attending  Case discussed with Dr. Coe  At the time of the visit.  We reviewed the resident's history and exam and pertinent patient test results.  I agree with the assessment, diagnosis, and plan of care documented in the resident's note.  

## 2020-06-12 ENCOUNTER — Other Ambulatory Visit: Payer: Self-pay | Admitting: Internal Medicine

## 2020-06-12 DIAGNOSIS — M1990 Unspecified osteoarthritis, unspecified site: Secondary | ICD-10-CM | POA: Diagnosis not present

## 2020-06-12 DIAGNOSIS — K219 Gastro-esophageal reflux disease without esophagitis: Secondary | ICD-10-CM | POA: Diagnosis not present

## 2020-06-12 DIAGNOSIS — E785 Hyperlipidemia, unspecified: Secondary | ICD-10-CM | POA: Diagnosis not present

## 2020-06-12 DIAGNOSIS — I5032 Chronic diastolic (congestive) heart failure: Secondary | ICD-10-CM | POA: Diagnosis not present

## 2020-06-12 DIAGNOSIS — G8929 Other chronic pain: Secondary | ICD-10-CM | POA: Diagnosis not present

## 2020-06-12 DIAGNOSIS — N183 Chronic kidney disease, stage 3 unspecified: Secondary | ICD-10-CM | POA: Diagnosis not present

## 2020-06-12 DIAGNOSIS — D631 Anemia in chronic kidney disease: Secondary | ICD-10-CM | POA: Diagnosis not present

## 2020-06-12 DIAGNOSIS — H9313 Tinnitus, bilateral: Secondary | ICD-10-CM | POA: Diagnosis not present

## 2020-06-12 DIAGNOSIS — M24561 Contracture, right knee: Secondary | ICD-10-CM | POA: Diagnosis not present

## 2020-06-12 DIAGNOSIS — M79672 Pain in left foot: Secondary | ICD-10-CM | POA: Diagnosis not present

## 2020-06-12 DIAGNOSIS — I441 Atrioventricular block, second degree: Secondary | ICD-10-CM | POA: Diagnosis not present

## 2020-06-12 DIAGNOSIS — M47816 Spondylosis without myelopathy or radiculopathy, lumbar region: Secondary | ICD-10-CM | POA: Diagnosis not present

## 2020-06-12 DIAGNOSIS — I700 Atherosclerosis of aorta: Secondary | ICD-10-CM | POA: Diagnosis not present

## 2020-06-12 DIAGNOSIS — I2510 Atherosclerotic heart disease of native coronary artery without angina pectoris: Secondary | ICD-10-CM | POA: Diagnosis not present

## 2020-06-12 DIAGNOSIS — M24562 Contracture, left knee: Secondary | ICD-10-CM | POA: Diagnosis not present

## 2020-06-12 DIAGNOSIS — M5030 Other cervical disc degeneration, unspecified cervical region: Secondary | ICD-10-CM | POA: Diagnosis not present

## 2020-06-12 DIAGNOSIS — E559 Vitamin D deficiency, unspecified: Secondary | ICD-10-CM

## 2020-06-12 DIAGNOSIS — H269 Unspecified cataract: Secondary | ICD-10-CM | POA: Diagnosis not present

## 2020-06-12 DIAGNOSIS — M79671 Pain in right foot: Secondary | ICD-10-CM | POA: Diagnosis not present

## 2020-06-12 DIAGNOSIS — M810 Age-related osteoporosis without current pathological fracture: Secondary | ICD-10-CM | POA: Diagnosis not present

## 2020-06-12 DIAGNOSIS — I130 Hypertensive heart and chronic kidney disease with heart failure and stage 1 through stage 4 chronic kidney disease, or unspecified chronic kidney disease: Secondary | ICD-10-CM | POA: Diagnosis not present

## 2020-06-12 DIAGNOSIS — I350 Nonrheumatic aortic (valve) stenosis: Secondary | ICD-10-CM | POA: Diagnosis not present

## 2020-06-12 MED ORDER — VITAMIN D (ERGOCALCIFEROL) 1.25 MG (50000 UNIT) PO CAPS
50000.0000 [IU] | ORAL_CAPSULE | ORAL | 0 refills | Status: DC
Start: 2020-06-12 — End: 2020-07-10

## 2020-06-12 NOTE — Progress Notes (Signed)
Patient was notified regarding her labs. I counseled her regarding her vitamin D deficiency. I will supplement her vitamin D for 1 month and then reevaluate her Ca, Vit D and PTH. Patient admits to understanding and will make a follow up appointment.    Lawerance Cruel, D.O.  Internal Medicine Resident, PGY-2 [facility]  Pager: [phone] 9:36 AM, 06/12/2020

## 2020-06-20 ENCOUNTER — Ambulatory Visit (INDEPENDENT_AMBULATORY_CARE_PROVIDER_SITE_OTHER): Payer: Medicare Other | Admitting: Internal Medicine

## 2020-06-20 ENCOUNTER — Other Ambulatory Visit: Payer: Self-pay

## 2020-06-20 ENCOUNTER — Encounter: Payer: Self-pay | Admitting: Internal Medicine

## 2020-06-20 DIAGNOSIS — Z0000 Encounter for general adult medical examination without abnormal findings: Secondary | ICD-10-CM

## 2020-06-20 DIAGNOSIS — E782 Mixed hyperlipidemia: Secondary | ICD-10-CM | POA: Diagnosis not present

## 2020-06-20 NOTE — Patient Instructions (Addendum)
All Notes   Progress Notes by Molli Hazard A, DO at 02/01/2020 10:55 AM  Author: Marty Heck, DO Author Type: Resident Filed: 02/02/2020 5:54 PM  Note Status: Signed Cosign: Cosign Not Required Encounter Date: 02/01/2020  Editor: Marty Heck, DO (Resident)             Things That May Be Affecting Your Health:  Alcohol  Hearing loss x Pain    Depression  Home Safety  Sexual Health   Diabetes  Lack of physical activity x Stress  x Difficulty with daily activities  Loneliness  Tiredness   Drug use  Medicines  Tobacco use  x Falls  Motor Vehicle Safety  Weight   Food choices  Oral Health  Other    YOUR PERSONALIZED HEALTH PLAN : 1. Schedule your next subsequent Medicare Wellness visit in one year 2. Attend all of your regular appointments to address your medical issues 3. Complete the preventative screenings and services 4.  A follow up appointment to check your blood pressure has been scheduled for 07/05/20 with Dr. Allyson Sabal at 9:45 5.  Please call [facility] and schedule your Bone Density Scan, an order has been placed. 6.  Please obtain your Covid-19 booster vaccine and your T-DAP vaccine at your pharmacy. 7.  Please begin seated exercises with exercise band 2-3 times a week for 5-10 minutes to increase strength and balance.   Annual Wellness Visit                       Medicare Covered Preventative Screenings and Services  Services & Screenings Men and Women Who How Often Need? Date of Last Service Action  Abdominal Aortic Aneurysm Adults with AAA risk factors Once     Alcohol Misuse and Counseling All Adults Screening once a year if no alcohol misuse. Counseling up to 4 face to face sessions.     Bone Density Measurement  Adults at risk for osteoporosis Once every 2 yrs x    Lipid Panel Z13.6 All adults without CV disease Once every 5 yrs x    Colorectal Cancer   Stool sample or  Colonoscopy All adults 90 and older    Once every year  Every 10 years     Depression All Adults Once a year x Today   Diabetes Screening Blood glucose, post glucose load, or GTT Z13.1  All adults at risk  Pre-diabetics  Once per year  Twice per year     Diabetes  Self-Management Training All adults Diabetics 10 hrs first year; 2 hours subsequent years. Requires Copay     Glaucoma  Diabetics  Family history of glaucoma  African Americans 54 yrs +  Hispanic Americans 26 yrs + Annually - requires coppay     Hepatitis C Z72.89 or F19.20  High Risk for HCV  Born between 1945 and 1965  Annually  Once     HIV Z11.4 All adults based on risk  Annually btw ages 71 & 20 regardless of risk  Annually > 65 yrs if at increased risk     Lung Cancer Screening Asymptomatic adults aged 44-77 with 30 pack yr history and current smoker OR quit within the last 15 yrs Annually Must have counseling and shared decision making documentation before first screen     Medical Nutrition Therapy Adults with   Diabetes  Renal disease  Kidney transplant within past 3 yrs 3 hours first year; 2 hours subsequent years x  Obesity and Counseling All adults Screening once a year Counseling if BMI 30 or higher  Today   Tobacco Use Counseling Adults who use tobacco  Up to 8 visits in one year     Vaccines Z23  Hepatitis B  Influenza   Pneumonia  Adults   Once  Once every flu season  Two different vaccines separated by one year     Next Annual Wellness Visit People with Medicare Every year  Today     [place] Women Who How Often Need  Date of Last Service Action  Mammogram  Z12.31 Women over 46 One baseline ages 95-39. Annually ager 40 yrs+     Pap tests All women Annually if high risk. Every 2 yrs for normal risk women     Screening for cervical cancer with   Pap (Z01.419 nl or Z01.411abnl) &  HPV Z11.51 Women aged 32 to 65 Once every 5 yrs     Screening  pelvic and breast exams All women Annually if high risk. Every 2 yrs for normal risk women     Sexually Transmitted Diseases  Chlamydia  Gonorrhea  Syphilis All at risk adults Annually for non pregnant females at increased risk         [place] Men Who How Ofter Need  Date of Last Service Action  Prostate Cancer - DRE & PSA Men over 50 Annually.  DRE might require a copay.     Sexually Transmitted Diseases  Syphilis All at risk adults Annually for men at increased risk                Bone Density Test A bone density test uses a type of X-ray to measure the amount of calcium and other minerals in a person's bones. It can measure bone density in the hip and the spine. The test is similar to having a regular X-ray. This test may also be called:  Bone densitometry.  Bone mineral density test.  Dual-energy X-ray absorptiometry (DEXA). You may have this test to:  Diagnose a condition that causes weak or thin bones (osteoporosis).  Screen you for osteoporosis.  Predict your risk for a broken bone (fracture).  Determine how well your osteoporosis treatment is working. Tell a health care provider about:  Any allergies you have.  All medicines you are taking, including vitamins, herbs, eye drops, creams, and over-the-counter medicines.  Any problems you or family members have had with anesthetic medicines.  Any blood disorders you have.  Any surgeries you have had.  Any medical conditions you have.  Whether you are pregnant or may be pregnant.  Any medical tests you have had within the past 14 days that used contrast material. What are the risks? Generally, this is a safe test. However, it does expose you to a small amount of radiation, which can slightly increase your cancer risk. What happens before the test?  Do not take any calcium supplements within the 24 hours before your test.  You will need to remove all metal  jewelry, eyeglasses, removable dental appliances, and any other metal objects on your body. What happens during the test?  You will lie down on an exam table. There will be an X-ray generator below you and an imaging device above you.  Other devices, such as boxes or braces, may be used to position your body properly for the scan.  The machine will slowly scan your body. You will need to keep very still while  the machine does the scan.  The images will show up on a screen in the room. Images will be examined by a specialist after your test is finished. The procedure may vary among health care providers and hospitals.   What can I expect after the test? It is up to you to get the results of your test. Ask your health care provider, or the department that is doing the test, when your results will be ready. Summary  A bone density test is an imaging test that uses a type of X-ray to measure the amount of calcium and other minerals in your bones.  The test may be used to diagnose or screen you for a condition that causes weak or thin bones (osteoporosis), predict your risk for a broken bone (fracture), or determine how well your osteoporosis treatment is working.  Do not take any calcium supplements within 24 hours before your test.  Ask your health care provider, or the department that is doing the test, when your results will be ready. This information is not intended to replace advice given to you by your health care provider. Make sure you discuss any questions you have with your health care provider. Document Revised: 07/22/2019 Document Reviewed: 07/22/2019 Elsevier Patient Education  2021 [place] Prevention in the Home, Adult Falls can cause injuries and can happen to people of all ages. There are many things you can do to make your home safe and to help prevent falls. Ask for help when making these changes. What actions can I take to prevent falls? General  Instructions  Use good lighting in all rooms. Replace any light bulbs that burn out.  Turn on the lights in dark areas. Use night-lights.  Keep items that you use often in easy-to-reach places. Lower the shelves around your home if needed.  Set up your furniture so you have a clear path. Avoid moving your furniture around.  Do not have throw rugs or other things on the floor that can make you trip.  Avoid walking on wet floors.  If any of your floors are uneven, fix them.  Add color or contrast paint or tape to clearly mark and help you see: ? Grab bars or handrails. ? First and last steps of staircases. ? Where the edge of each step is.  If you use a stepladder: ? Make sure that it is fully opened. Do not climb a closed stepladder. ? Make sure the sides of the stepladder are locked in place. ? Ask someone to hold the stepladder while you use it.  Know where your pets are when moving through your home. What can I do in the bathroom?  Keep the floor dry. Clean up any water on the floor right away.  Remove soap buildup in the tub or shower.  Use nonskid mats or decals on the floor of the tub or shower.  Attach bath mats securely with double-sided, nonslip rug tape.  If you need to sit down in the shower, use a plastic, nonslip stool.  Install grab bars by the toilet and in the tub and shower. Do not use towel bars as grab bars.      What can I do in the bedroom?  Make sure that you have a light by your bed that is easy to reach.  Do not use any sheets or blankets for your bed that hang to the floor.  Have a firm chair with side arms that you can  use for support when you get dressed. What can I do in the kitchen?  Clean up any spills right away.  If you need to reach something above you, use a step stool with a grab bar.  Keep electrical cords out of the way.  Do not use floor polish or wax that makes floors slippery. What can I do with my stairs?  Do not  leave any items on the stairs.  Make sure that you have a light switch at the top and the bottom of the stairs.  Make sure that there are handrails on both sides of the stairs. Fix handrails that are broken or loose.  Install nonslip stair treads on all your stairs.  Avoid having throw rugs at the top or bottom of the stairs.  Choose a carpet that does not hide the edge of the steps on the stairs.  Check carpeting to make sure that it is firmly attached to the stairs. Fix carpet that is loose or worn. What can I do on the outside of my home?  Use bright outdoor lighting.  Fix the edges of walkways and driveways and fix any cracks.  Remove anything that might make you trip as you walk through a door, such as a raised step or threshold.  Trim any bushes or trees on paths to your home.  Check to see if handrails are loose or broken and that both sides of all steps have handrails.  Install guardrails along the edges of any raised decks and porches.  Clear paths of anything that can make you trip, such as tools or rocks.  Have leaves, snow, or ice cleared regularly.  Use sand or salt on paths during winter.  Clean up any spills in your garage right away. This includes grease or oil spills. What other actions can I take?  Wear shoes that: ? Have a low heel. Do not wear high heels. ? Have rubber bottoms. ? Feel good on your feet and fit well. ? Are closed at the toe. Do not wear open-toe sandals.  Use tools that help you move around if needed. These include: ? Canes. ? Walkers. ? Scooters. ? Crutches.  Review your medicines with your doctor. Some medicines can make you feel dizzy. This can increase your chance of falling. Ask your doctor what else you can do to help prevent falls. Where to find more information  Centers for Disease Control and Prevention, STEADI: http://www.wolf.info/  National Institute on Aging: http://kim-miller.com/ Contact a doctor if:  You are afraid of falling  at home.  You feel weak, drowsy, or dizzy at home.  You fall at home. Summary  There are many simple things that you can do to make your home safe and to help prevent falls.  Ways to make your home safe include removing things that can make you trip and installing grab bars in the bathroom.  Ask for help when making these changes in your home. This information is not intended to replace advice given to you by your health care provider. Make sure you discuss any questions you have with your health care provider. Document Revised: 09/08/2019 Document Reviewed: 09/08/2019 Elsevier Patient Education  [place] Maintenance, Female Adopting a healthy lifestyle and getting preventive care are important in promoting health and wellness. Ask your health care provider about:  The right schedule for you to have regular tests and exams.  Things you can do on your own to prevent diseases and  keep yourself healthy. What should I know about diet, weight, and exercise? Eat a healthy diet  Eat a diet that includes plenty of vegetables, fruits, low-fat dairy products, and lean protein.  Do not eat a lot of foods that are high in solid fats, added sugars, or sodium.   Maintain a healthy weight Body mass index (BMI) is used to identify weight problems. It estimates body fat based on height and weight. Your health care provider can help determine your BMI and help you achieve or maintain a healthy weight. Get regular exercise Get regular exercise. This is one of the most important things you can do for your health. Most adults should:  Exercise for at least 150 minutes each week. The exercise should increase your heart rate and make you sweat (moderate-intensity exercise).  Do strengthening exercises at least twice a week. This is in addition to the moderate-intensity exercise.  Spend less time sitting. Even light physical activity can be beneficial. Watch cholesterol and blood  lipids Have your blood tested for lipids and cholesterol at 74 years of age, then have this test every 5 years. Have your cholesterol levels checked more often if:  Your lipid or cholesterol levels are high.  You are older than 74 years of age.  You are at high risk for heart disease. What should I know about cancer screening? Depending on your health history and family history, you may need to have cancer screening at various ages. This may include screening for:  Breast cancer.  Cervical cancer.  Colorectal cancer.  Skin cancer.  Lung cancer. What should I know about heart disease, diabetes, and high blood pressure? Blood pressure and heart disease  High blood pressure causes heart disease and increases the risk of stroke. This is more likely to develop in people who have high blood pressure readings, are of African descent, or are overweight.  Have your blood pressure checked: ? Every 3-5 years if you are 41-48 years of age. ? Every year if you are 27 years old or older. Diabetes Have regular diabetes screenings. This checks your fasting blood sugar level. Have the screening done:  Once every three years after age 63 if you are at a normal weight and have a low risk for diabetes.  More often and at a younger age if you are overweight or have a high risk for diabetes. What should I know about preventing infection? Hepatitis B If you have a higher risk for hepatitis B, you should be screened for this virus. Talk with your health care provider to find out if you are at risk for hepatitis B infection. Hepatitis C Testing is recommended for:  Everyone born from [birth year] through 1964.  Anyone with known risk factors for hepatitis C. Sexually transmitted infections (STIs)  Get screened for STIs, including gonorrhea and chlamydia, if: ? You are sexually active and are younger than 74 years of age. ? You are older than 74 years of age and your health care provider tells you that  you are at risk for this type of infection. ? Your sexual activity has changed since you were last screened, and you are at increased risk for chlamydia or gonorrhea. Ask your health care provider if you are at risk.  Ask your health care provider about whether you are at high risk for HIV. Your health care provider may recommend a prescription medicine to help prevent HIV infection. If you choose to take medicine to prevent HIV, you should first get  tested for HIV. You should then be tested every 3 months for as long as you are taking the medicine. Pregnancy  If you are about to stop having your period (premenopausal) and you may become pregnant, seek counseling before you get pregnant.  Take 400 to 800 micrograms (mcg) of folic acid every day if you become pregnant.  Ask for birth control (contraception) if you want to prevent pregnancy. Osteoporosis and menopause Osteoporosis is a disease in which the bones lose minerals and strength with aging. This can result in bone fractures. If you are 89 years old or older, or if you are at risk for osteoporosis and fractures, ask your health care provider if you should:  Be screened for bone loss.  Take a calcium or vitamin D supplement to lower your risk of fractures.  Be given hormone replacement therapy (HRT) to treat symptoms of menopause. Follow these instructions at home: Lifestyle  Do not use any products that contain nicotine or tobacco, such as cigarettes, e-cigarettes, and chewing tobacco. If you need help quitting, ask your health care provider.  Do not use street drugs.  Do not share needles.  Ask your health care provider for help if you need support or information about quitting drugs. Alcohol use  Do not drink alcohol if: ? Your health care provider tells you not to drink. ? You are pregnant, may be pregnant, or are planning to become pregnant.  If you drink alcohol: ? Limit how much you use to 0-1 drink a day. ? Limit  intake if you are breastfeeding.  Be aware of how much alcohol is in your drink. In the [place] one drink equals one 12 oz bottle of beer (355 mL), one 5 oz glass of wine (148 mL), or one 1 oz glass of hard liquor (44 mL). General instructions  Schedule regular health, dental, and eye exams.  Stay current with your vaccines.  Tell your health care provider if: ? You often feel depressed. ? You have ever been abused or do not feel safe at home. Summary  Adopting a healthy lifestyle and getting preventive care are important in promoting health and wellness.  Follow your health care provider's instructions about healthy diet, exercising, and getting tested or screened for diseases.  Follow your health care provider's instructions on monitoring your cholesterol and blood pressure. This information is not intended to replace advice given to you by your health care provider. Make sure you discuss any questions you have with your health care provider. Document Revised: 01/28/2018 Document Reviewed: 01/28/2018 Elsevier Patient Education  2021 [employer].

## 2020-06-20 NOTE — Progress Notes (Addendum)
This AWV is being conducted by [place] only. The patient was located at home and I was located in [facility]. The patient's identity was confirmed using their DOB and current address. The patient or his/her legal guardian has consented to being evaluated through a telephone encounter and understands the associated risks (an examination cannot be done and the patient may need to come in for an appointment) / benefits (allows the patient to remain at home, decreasing exposure to coronavirus). I personally spent 40 minutes conducting the AWV.  Subjective:   Sherry [name] is a 75 y.o. female who presents for a Medicare Annual Wellness Visit.  The following items have been reviewed and updated today in the appropriate area in the EMR.   Health Risk Assessment  Height, weight, BMI, and BP Visual acuity if needed Depression screen Fall risk / safety level Advance directive discussion Medical and family history were reviewed and updated Updating list of other providers & suppliers Medication reconciliation, including over the counter medicines Cognitive screen Written screening schedule Risk Factor list Personalized health advice, risky behaviors, and treatment advice  Social History   Social History Narrative   Current Social History 06/20/2020        Patient lives with spouse in a home which is 1 story. There are not steps up to the entrance, the patient uses a ramp.       Patient's method of transportation is [place].      The highest level of education was some high school.      The patient currently disabled.      Identified important Relationships are "God and my husband"       Pets : None       Interests / Fun: "Sitting on my front porch"       Current Stressors: None                    Objective:    Vitals: There were no vitals taken for this visit. Vitals are unable to obtained due to [id number] public health emergency  Activities of Daily Living In your  present state of health, do you have any difficulty performing the following activities: 06/20/2020 06/05/2020  Hearing? N N  Vision? N N  Comment - -  Difficulty concentrating or making decisions? N N  Walking or climbing stairs? Y Y  Comment Patient is in a w/c -  Dressing or bathing? Y Y  Comment Husband assist -  Doing errands, shopping? Y Y  Comment [employer] -  Some recent data might be hidden    Goals Goals    . Blood Pressure < 130/80     Given her CKD    . Increase physical activity     Begin seated exercises with exercise band 2-3 times a week for 5-10 minutes to increase strength and balance.     [name] LDL CALC < 130    . Prevent Falls       Fall Risk Fall Risk  06/20/2020 06/05/2020 05/23/2020 02/08/2020 01/19/2020  Falls in the past year? 0 0 0 0 1  Comment - - - - -  Number falls in past yr: - 0 0 - 0  Injury with Fall? - 0 0 - 1  Comment - - - - Aches a couple days later  Risk Factor Category  - - - - -  Risk for fall due to : Impaired mobility Impaired mobility;Impaired balance/gait - History of fall(s);Impaired balance/gait;Impaired  mobility History of fall(s);Impaired balance/gait;Impaired mobility  Risk for fall due to: Comment - - - - severe leg pain and swelling  Follow up Falls evaluation completed Falls evaluation completed - Falls prevention discussed Falls prevention discussed    Depression Screen PHQ 2/9 Scores 06/20/2020 06/05/2020 02/08/2020 10/27/2019  PHQ - 2 Score 0 0 0 0  PHQ- 9 Score 0 - - -     Cognitive Testing Six-Item Cognitive Screener   "I would like to ask you some questions that ask you to use your memory. I am going to name three objects. Please wait until I say all three words, then repeat them. Remember what they are  because I am going to ask you to name them again in a few minutes. Please repeat these words for me: APPLE--TABLE--PENNY." (Interviewer may repeat names 3 times if necessary but repetition not scored.)  Did patient correctly  repeat all three words? Yes - may proceed with screen  What year is this? Incorrect What month is this? Correct What day of the week is this? Correct  What were the three objects I asked you to remember? . Apple Correct . Table Correct . Penny Incorrect  Score one point for each incorrect answer.  A score of 2 or more points warrants additional investigation.  Patient's score 2     Assessment and Plan:     The patient will call her pharmacy and schedule her Covid-19 booster and TDAP vaccine.  The patient is due for a b/p follow up in mid May.  A follow up visit was scheduled on 07/05/20 with Dr. Allyson Sabal at [phone].  The patient states she has not been taking Crestor and does not have a RX available.  Will send to MD for refill.  Patient is due for a DEXA scan, she will call [facility] to schedule.  Will send to MD to place order.  The patient has set a goal to increase her activity level, she is mostly w/c bound.  She will begin seated exercises with exercise band 2-3 times weekly 5-10 minutes, to increase strength and balance.  CDC Handout on Fall Prevention and Handout on Home Exercise Program, Access codes [id number] and [id number] given/mailed to patient with exercise band.   During the course of the visit the patient was educated and counseled about appropriate screening and preventive services as documented in the assessment and plan.  The printed AVS was given to the patient and included an updated screening schedule, a list of risk factors, and personalized health advice.        Higinio Roger, RN  06/20/2020   I discussed the AWV findings with the RN who conducted the visit. I was present in the office suite and immediately available to provide assistance and direction throughout the time the service was provided.  Tamsen Snider, MD PGY2 Internal Medicine [phone]

## 2020-06-21 ENCOUNTER — Encounter: Payer: Self-pay | Admitting: Internal Medicine

## 2020-06-21 MED ORDER — ROSUVASTATIN CALCIUM 10 MG PO TABS: 10.0000 mg | ORAL_TABLET | Freq: Every day | ORAL | 2 refills | Status: DC

## 2020-06-21 NOTE — Addendum Note (Signed)
Addended by: Lyndal Pulley on: 06/21/2020 09:03 AM   Modules accepted: Orders

## 2020-06-21 NOTE — Progress Notes (Signed)
Internal Medicine Clinic Attending  Case discussed with Dr.  Steen  at the time of the visit.  We reviewed the AWV findings.  I agree with the assessment, diagnosis, and plan of care documented in the AWV note.     

## 2020-06-22 DIAGNOSIS — M47816 Spondylosis without myelopathy or radiculopathy, lumbar region: Secondary | ICD-10-CM | POA: Diagnosis not present

## 2020-06-22 DIAGNOSIS — D631 Anemia in chronic kidney disease: Secondary | ICD-10-CM | POA: Diagnosis not present

## 2020-06-22 DIAGNOSIS — I441 Atrioventricular block, second degree: Secondary | ICD-10-CM | POA: Diagnosis not present

## 2020-06-22 DIAGNOSIS — I350 Nonrheumatic aortic (valve) stenosis: Secondary | ICD-10-CM | POA: Diagnosis not present

## 2020-06-22 DIAGNOSIS — M24561 Contracture, right knee: Secondary | ICD-10-CM | POA: Diagnosis not present

## 2020-06-22 DIAGNOSIS — I5032 Chronic diastolic (congestive) heart failure: Secondary | ICD-10-CM | POA: Diagnosis not present

## 2020-06-22 DIAGNOSIS — I700 Atherosclerosis of aorta: Secondary | ICD-10-CM | POA: Diagnosis not present

## 2020-06-22 DIAGNOSIS — H9313 Tinnitus, bilateral: Secondary | ICD-10-CM | POA: Diagnosis not present

## 2020-06-22 DIAGNOSIS — E785 Hyperlipidemia, unspecified: Secondary | ICD-10-CM | POA: Diagnosis not present

## 2020-06-22 DIAGNOSIS — I130 Hypertensive heart and chronic kidney disease with heart failure and stage 1 through stage 4 chronic kidney disease, or unspecified chronic kidney disease: Secondary | ICD-10-CM | POA: Diagnosis not present

## 2020-06-22 DIAGNOSIS — N183 Chronic kidney disease, stage 3 unspecified: Secondary | ICD-10-CM | POA: Diagnosis not present

## 2020-06-22 DIAGNOSIS — I2510 Atherosclerotic heart disease of native coronary artery without angina pectoris: Secondary | ICD-10-CM | POA: Diagnosis not present

## 2020-06-22 DIAGNOSIS — M79672 Pain in left foot: Secondary | ICD-10-CM | POA: Diagnosis not present

## 2020-06-22 DIAGNOSIS — M24562 Contracture, left knee: Secondary | ICD-10-CM | POA: Diagnosis not present

## 2020-06-22 DIAGNOSIS — H269 Unspecified cataract: Secondary | ICD-10-CM | POA: Diagnosis not present

## 2020-06-22 DIAGNOSIS — K219 Gastro-esophageal reflux disease without esophagitis: Secondary | ICD-10-CM | POA: Diagnosis not present

## 2020-06-22 DIAGNOSIS — M5030 Other cervical disc degeneration, unspecified cervical region: Secondary | ICD-10-CM | POA: Diagnosis not present

## 2020-06-22 DIAGNOSIS — G8929 Other chronic pain: Secondary | ICD-10-CM | POA: Diagnosis not present

## 2020-06-22 DIAGNOSIS — M810 Age-related osteoporosis without current pathological fracture: Secondary | ICD-10-CM | POA: Diagnosis not present

## 2020-06-22 DIAGNOSIS — M1990 Unspecified osteoarthritis, unspecified site: Secondary | ICD-10-CM | POA: Diagnosis not present

## 2020-06-22 DIAGNOSIS — M79671 Pain in right foot: Secondary | ICD-10-CM | POA: Diagnosis not present

## 2020-06-28 ENCOUNTER — Other Ambulatory Visit: Payer: Self-pay | Admitting: Internal Medicine

## 2020-06-28 DIAGNOSIS — N183 Chronic kidney disease, stage 3 unspecified: Secondary | ICD-10-CM | POA: Diagnosis not present

## 2020-06-28 DIAGNOSIS — M1990 Unspecified osteoarthritis, unspecified site: Secondary | ICD-10-CM | POA: Diagnosis not present

## 2020-06-28 DIAGNOSIS — M79671 Pain in right foot: Secondary | ICD-10-CM | POA: Diagnosis not present

## 2020-06-28 DIAGNOSIS — I5032 Chronic diastolic (congestive) heart failure: Secondary | ICD-10-CM | POA: Diagnosis not present

## 2020-06-28 DIAGNOSIS — G8929 Other chronic pain: Secondary | ICD-10-CM | POA: Diagnosis not present

## 2020-06-28 DIAGNOSIS — H9313 Tinnitus, bilateral: Secondary | ICD-10-CM | POA: Diagnosis not present

## 2020-06-28 DIAGNOSIS — H269 Unspecified cataract: Secondary | ICD-10-CM | POA: Diagnosis not present

## 2020-06-28 DIAGNOSIS — M47816 Spondylosis without myelopathy or radiculopathy, lumbar region: Secondary | ICD-10-CM | POA: Diagnosis not present

## 2020-06-28 DIAGNOSIS — M24561 Contracture, right knee: Secondary | ICD-10-CM | POA: Diagnosis not present

## 2020-06-28 DIAGNOSIS — M5030 Other cervical disc degeneration, unspecified cervical region: Secondary | ICD-10-CM | POA: Diagnosis not present

## 2020-06-28 DIAGNOSIS — I441 Atrioventricular block, second degree: Secondary | ICD-10-CM | POA: Diagnosis not present

## 2020-06-28 DIAGNOSIS — I10 Essential (primary) hypertension: Secondary | ICD-10-CM

## 2020-06-28 DIAGNOSIS — K219 Gastro-esophageal reflux disease without esophagitis: Secondary | ICD-10-CM | POA: Diagnosis not present

## 2020-06-28 DIAGNOSIS — I700 Atherosclerosis of aorta: Secondary | ICD-10-CM | POA: Diagnosis not present

## 2020-06-28 DIAGNOSIS — M810 Age-related osteoporosis without current pathological fracture: Secondary | ICD-10-CM | POA: Diagnosis not present

## 2020-06-28 DIAGNOSIS — I350 Nonrheumatic aortic (valve) stenosis: Secondary | ICD-10-CM | POA: Diagnosis not present

## 2020-06-28 DIAGNOSIS — I2510 Atherosclerotic heart disease of native coronary artery without angina pectoris: Secondary | ICD-10-CM | POA: Diagnosis not present

## 2020-06-28 DIAGNOSIS — D631 Anemia in chronic kidney disease: Secondary | ICD-10-CM | POA: Diagnosis not present

## 2020-06-28 DIAGNOSIS — I130 Hypertensive heart and chronic kidney disease with heart failure and stage 1 through stage 4 chronic kidney disease, or unspecified chronic kidney disease: Secondary | ICD-10-CM | POA: Diagnosis not present

## 2020-06-28 DIAGNOSIS — M24562 Contracture, left knee: Secondary | ICD-10-CM | POA: Diagnosis not present

## 2020-06-28 DIAGNOSIS — M79672 Pain in left foot: Secondary | ICD-10-CM | POA: Diagnosis not present

## 2020-06-28 DIAGNOSIS — E785 Hyperlipidemia, unspecified: Secondary | ICD-10-CM | POA: Diagnosis not present

## 2020-06-28 NOTE — Telephone Encounter (Signed)
Received refill request for amlodipine from pharmacy-medication no longer on list Confirmed with patient-she IS NOT taking this medication.  Refill refused.Regenia Skeeter, Babbette Dalesandro Cassady5/11/202210:07 AM

## 2020-06-29 DIAGNOSIS — D631 Anemia in chronic kidney disease: Secondary | ICD-10-CM | POA: Diagnosis not present

## 2020-06-29 DIAGNOSIS — I350 Nonrheumatic aortic (valve) stenosis: Secondary | ICD-10-CM | POA: Diagnosis not present

## 2020-06-29 DIAGNOSIS — I130 Hypertensive heart and chronic kidney disease with heart failure and stage 1 through stage 4 chronic kidney disease, or unspecified chronic kidney disease: Secondary | ICD-10-CM | POA: Diagnosis not present

## 2020-06-29 DIAGNOSIS — K219 Gastro-esophageal reflux disease without esophagitis: Secondary | ICD-10-CM | POA: Diagnosis not present

## 2020-06-29 DIAGNOSIS — M24561 Contracture, right knee: Secondary | ICD-10-CM | POA: Diagnosis not present

## 2020-06-29 DIAGNOSIS — G8929 Other chronic pain: Secondary | ICD-10-CM | POA: Diagnosis not present

## 2020-06-29 DIAGNOSIS — I2510 Atherosclerotic heart disease of native coronary artery without angina pectoris: Secondary | ICD-10-CM | POA: Diagnosis not present

## 2020-06-29 DIAGNOSIS — M24562 Contracture, left knee: Secondary | ICD-10-CM | POA: Diagnosis not present

## 2020-06-29 DIAGNOSIS — M810 Age-related osteoporosis without current pathological fracture: Secondary | ICD-10-CM | POA: Diagnosis not present

## 2020-06-29 DIAGNOSIS — I700 Atherosclerosis of aorta: Secondary | ICD-10-CM | POA: Diagnosis not present

## 2020-06-29 DIAGNOSIS — M47816 Spondylosis without myelopathy or radiculopathy, lumbar region: Secondary | ICD-10-CM | POA: Diagnosis not present

## 2020-06-29 DIAGNOSIS — I5032 Chronic diastolic (congestive) heart failure: Secondary | ICD-10-CM | POA: Diagnosis not present

## 2020-06-29 DIAGNOSIS — N183 Chronic kidney disease, stage 3 unspecified: Secondary | ICD-10-CM | POA: Diagnosis not present

## 2020-06-29 DIAGNOSIS — H269 Unspecified cataract: Secondary | ICD-10-CM | POA: Diagnosis not present

## 2020-06-29 DIAGNOSIS — M79671 Pain in right foot: Secondary | ICD-10-CM | POA: Diagnosis not present

## 2020-06-29 DIAGNOSIS — E785 Hyperlipidemia, unspecified: Secondary | ICD-10-CM | POA: Diagnosis not present

## 2020-06-29 DIAGNOSIS — M1990 Unspecified osteoarthritis, unspecified site: Secondary | ICD-10-CM | POA: Diagnosis not present

## 2020-06-29 DIAGNOSIS — H9313 Tinnitus, bilateral: Secondary | ICD-10-CM | POA: Diagnosis not present

## 2020-06-29 DIAGNOSIS — M79672 Pain in left foot: Secondary | ICD-10-CM | POA: Diagnosis not present

## 2020-06-29 DIAGNOSIS — I441 Atrioventricular block, second degree: Secondary | ICD-10-CM | POA: Diagnosis not present

## 2020-06-29 DIAGNOSIS — M5030 Other cervical disc degeneration, unspecified cervical region: Secondary | ICD-10-CM | POA: Diagnosis not present

## 2020-06-30 ENCOUNTER — Other Ambulatory Visit: Payer: Self-pay | Admitting: Internal Medicine

## 2020-06-30 DIAGNOSIS — M810 Age-related osteoporosis without current pathological fracture: Secondary | ICD-10-CM

## 2020-06-30 DIAGNOSIS — Z1231 Encounter for screening mammogram for malignant neoplasm of breast: Secondary | ICD-10-CM

## 2020-07-03 ENCOUNTER — Other Ambulatory Visit: Payer: Self-pay | Admitting: Internal Medicine

## 2020-07-04 ENCOUNTER — Other Ambulatory Visit: Payer: Self-pay

## 2020-07-04 ENCOUNTER — Ambulatory Visit
Admission: RE | Admit: 2020-07-04 | Discharge: 2020-07-04 | Disposition: A | Discharge status: Home / Self Care | Payer: Medicare Other | Source: Ambulatory Visit | Attending: Internal Medicine | Admitting: Internal Medicine

## 2020-07-04 DIAGNOSIS — Z1231 Encounter for screening mammogram for malignant neoplasm of breast: Secondary | ICD-10-CM

## 2020-07-04 DIAGNOSIS — I441 Atrioventricular block, second degree: Secondary | ICD-10-CM | POA: Diagnosis not present

## 2020-07-04 DIAGNOSIS — M79671 Pain in right foot: Secondary | ICD-10-CM | POA: Diagnosis not present

## 2020-07-04 DIAGNOSIS — Z780 Asymptomatic menopausal state: Secondary | ICD-10-CM | POA: Diagnosis not present

## 2020-07-04 DIAGNOSIS — G8929 Other chronic pain: Secondary | ICD-10-CM | POA: Diagnosis not present

## 2020-07-04 DIAGNOSIS — I130 Hypertensive heart and chronic kidney disease with heart failure and stage 1 through stage 4 chronic kidney disease, or unspecified chronic kidney disease: Secondary | ICD-10-CM | POA: Diagnosis not present

## 2020-07-04 DIAGNOSIS — M79672 Pain in left foot: Secondary | ICD-10-CM | POA: Diagnosis not present

## 2020-07-04 DIAGNOSIS — M810 Age-related osteoporosis without current pathological fracture: Secondary | ICD-10-CM | POA: Diagnosis not present

## 2020-07-04 DIAGNOSIS — M24562 Contracture, left knee: Secondary | ICD-10-CM | POA: Diagnosis not present

## 2020-07-04 DIAGNOSIS — E785 Hyperlipidemia, unspecified: Secondary | ICD-10-CM | POA: Diagnosis not present

## 2020-07-04 DIAGNOSIS — N183 Chronic kidney disease, stage 3 unspecified: Secondary | ICD-10-CM | POA: Diagnosis not present

## 2020-07-04 DIAGNOSIS — H269 Unspecified cataract: Secondary | ICD-10-CM | POA: Diagnosis not present

## 2020-07-04 DIAGNOSIS — I5032 Chronic diastolic (congestive) heart failure: Secondary | ICD-10-CM | POA: Diagnosis not present

## 2020-07-04 DIAGNOSIS — I350 Nonrheumatic aortic (valve) stenosis: Secondary | ICD-10-CM | POA: Diagnosis not present

## 2020-07-04 DIAGNOSIS — D631 Anemia in chronic kidney disease: Secondary | ICD-10-CM | POA: Diagnosis not present

## 2020-07-04 DIAGNOSIS — M85851 Other specified disorders of bone density and structure, right thigh: Secondary | ICD-10-CM | POA: Diagnosis not present

## 2020-07-04 DIAGNOSIS — M24561 Contracture, right knee: Secondary | ICD-10-CM | POA: Diagnosis not present

## 2020-07-04 DIAGNOSIS — M1990 Unspecified osteoarthritis, unspecified site: Secondary | ICD-10-CM | POA: Diagnosis not present

## 2020-07-04 DIAGNOSIS — I700 Atherosclerosis of aorta: Secondary | ICD-10-CM | POA: Diagnosis not present

## 2020-07-04 DIAGNOSIS — M5030 Other cervical disc degeneration, unspecified cervical region: Secondary | ICD-10-CM | POA: Diagnosis not present

## 2020-07-04 DIAGNOSIS — M47816 Spondylosis without myelopathy or radiculopathy, lumbar region: Secondary | ICD-10-CM | POA: Diagnosis not present

## 2020-07-04 DIAGNOSIS — H9313 Tinnitus, bilateral: Secondary | ICD-10-CM | POA: Diagnosis not present

## 2020-07-04 DIAGNOSIS — I2510 Atherosclerotic heart disease of native coronary artery without angina pectoris: Secondary | ICD-10-CM | POA: Diagnosis not present

## 2020-07-04 DIAGNOSIS — K219 Gastro-esophageal reflux disease without esophagitis: Secondary | ICD-10-CM | POA: Diagnosis not present

## 2020-07-05 ENCOUNTER — Ambulatory Visit (INDEPENDENT_AMBULATORY_CARE_PROVIDER_SITE_OTHER): Payer: Medicare Other | Admitting: Student

## 2020-07-05 ENCOUNTER — Encounter: Payer: Self-pay | Admitting: Student

## 2020-07-05 ENCOUNTER — Ambulatory Visit (HOSPITAL_COMMUNITY)
Admission: RE | Admit: 2020-07-05 | Discharge: 2020-07-05 | Disposition: A | Discharge status: Home / Self Care | Payer: Medicare Other | Source: Ambulatory Visit | Attending: Internal Medicine | Admitting: Internal Medicine

## 2020-07-05 VITALS — BP 164/52 | HR 44 | Temp 98.0°F

## 2020-07-05 DIAGNOSIS — I350 Nonrheumatic aortic (valve) stenosis: Secondary | ICD-10-CM

## 2020-07-05 DIAGNOSIS — I10 Essential (primary) hypertension: Secondary | ICD-10-CM

## 2020-07-05 DIAGNOSIS — I5032 Chronic diastolic (congestive) heart failure: Secondary | ICD-10-CM | POA: Diagnosis not present

## 2020-07-05 DIAGNOSIS — R001 Bradycardia, unspecified: Secondary | ICD-10-CM | POA: Diagnosis not present

## 2020-07-05 NOTE — Patient Instructions (Signed)
Sherry [name],  It was a pleasure seeing you in the clinic today.   1. Please take a whole tablet of lisinopril ('40mg'$ ) daily.  2. I have referred you to Advanced Heart Failure as we discussed.  3. Please come back in 2 weeks with a log of your home blood pressure readings.  Please call our clinic at [phone] if you have any questions or concerns. The best time to call is Monday-Friday from 9am-4pm, but there is someone available 24/7 at the same number. If you need medication refills, please notify your pharmacy one week in advance and they will send [place] a request.   Thank you for letting us take part in your care. We look forward to seeing you next time!

## 2020-07-05 NOTE — Progress Notes (Signed)
   CC: f/u of HTn  HPI:  [name] is a 75 y.o. female with history listed below presenting to [facility] for f/u of HTN. Please see individualized problem based charting for full HPI.  Past Medical History:  Diagnosis Date  . AKI (acute kidney injury) ([place]) 01/31/2020  . Anemia   . Aortic stenosis   . Arthritis   . Bilateral lower extremity edema   . Bradycardia    a. 01/2013 - asymptomatic.  [name] Chronic diastolic heart failure ([place])   . CKD (chronic kidney disease)    a. baseline CKD stage III  . Coronary artery disease   . GERD (gastroesophageal reflux disease)   . Headache(784.0)   . Heart block AV second degree 02/15/2013  . Heart murmur   . Hyperlipidemia   . Hypertension   . Mobitz (type) I (Wenckebach's) atrioventricular block    a. 01/2013 - asymptomatic.  . Morbid obesity ([place])   . Seizure disorder ([place])   . Seizures ([place])   . TIA (transient ischemic attack) 2014   pt stated she had "mini strokes"    Review of Systems:  Negative aside from that listed in individualized problem based charting.  Physical Exam:  Vitals:   07/05/20 0956 07/05/20 1015  BP: (!) 182/55 (!) 164/52  Pulse: (!) 42 (!) 44  Temp: 98 F (36.7 C)   TempSrc: Oral    Physical Exam Constitutional:      Comments: Sitting in wheelchair, chronically ill-appearing.  Eyes:     Extraocular Movements: Extraocular movements intact.     Conjunctiva/sclera: Conjunctivae normal.     Pupils: Pupils are equal, round, and reactive to light.  Cardiovascular:     Rate and Rhythm: Regular rhythm. Bradycardia present.     Pulses: Normal pulses.     Comments: + AS murmur Pulmonary:     Effort: Pulmonary effort is normal.     Breath sounds: Normal breath sounds. No wheezing, rhonchi or rales.  Skin:    General: Skin is warm and dry.  Neurological:     General: No focal deficit present.     Mental Status: She is alert and oriented to person, place, and time.  Psychiatric:        Mood  and Affect: Mood normal.        Behavior: Behavior normal.      Assessment & Plan:   See Encounters Tab for problem based charting.  Patient discussed with Dr. Evette Doffing

## 2020-07-05 NOTE — Assessment & Plan Note (Signed)
[name] has a history of hypertension, currently on lisinopril 20 mg daily and Lasix 40 mg daily.  For control of her hypertension, she was previously on Norvasc 10 mg daily, lisinopril 40 mg daily, Lasix 40 mg daily.  However after recent hospitalization where she became hypotensive with those medications, she was discharged with Lasix 40 mg alone.  During last office visit, BP was elevated and she was restarted on lisinopril 20 mg daily.  Today initial blood pressure was 182/55 with repeat BP at 164/52.  She states that at home her blood pressure readings are typically in the high 130s to 140s. Discussed that this blood pressure is still not at goal, especially given prior history of CVA.  We agreed to increase her lisinopril back to 40 mg daily at this time.  Will arrange for close follow-up in 2 weeks to reassess blood pressures and if still elevated may need to consider restarting Norvasc as well.  Of note, she does take her blood pressure readings at home but forgot to bring her log today.  Advised her to bring her blood pressure log during her next appointment so that we can see her blood pressure readings at home.  Plan: -continue lasix '40mg'$  daily -increased lisinopril to '40mg'$  daily -f/u in 2 weeks with BP log -if BP still elevated, consider restarting norvasc (avoid any medications that affect HR -- see chronic bradycardia)

## 2020-07-05 NOTE — Assessment & Plan Note (Signed)
Patient's pulse today at 42 with repeat at 44.  She has a history of chronic sinus bradycardia and states that her heart rate typically ranges between 50s and 60s.  She is currently asymptomatic.  EKG showing sinus bradycardia with all P waves being conducted no conduction abnormalities.  Only notable finding on EKG was chronic first-degree AV block which is consistent with prior EKGs.  This is a low risk finding.

## 2020-07-06 DIAGNOSIS — I700 Atherosclerosis of aorta: Secondary | ICD-10-CM | POA: Diagnosis not present

## 2020-07-06 DIAGNOSIS — M1990 Unspecified osteoarthritis, unspecified site: Secondary | ICD-10-CM | POA: Diagnosis not present

## 2020-07-06 DIAGNOSIS — N183 Chronic kidney disease, stage 3 unspecified: Secondary | ICD-10-CM | POA: Diagnosis not present

## 2020-07-06 DIAGNOSIS — I441 Atrioventricular block, second degree: Secondary | ICD-10-CM | POA: Diagnosis not present

## 2020-07-06 DIAGNOSIS — I2510 Atherosclerotic heart disease of native coronary artery without angina pectoris: Secondary | ICD-10-CM | POA: Diagnosis not present

## 2020-07-06 DIAGNOSIS — M47816 Spondylosis without myelopathy or radiculopathy, lumbar region: Secondary | ICD-10-CM | POA: Diagnosis not present

## 2020-07-06 DIAGNOSIS — K219 Gastro-esophageal reflux disease without esophagitis: Secondary | ICD-10-CM | POA: Diagnosis not present

## 2020-07-06 DIAGNOSIS — M79672 Pain in left foot: Secondary | ICD-10-CM | POA: Diagnosis not present

## 2020-07-06 DIAGNOSIS — M79671 Pain in right foot: Secondary | ICD-10-CM | POA: Diagnosis not present

## 2020-07-06 DIAGNOSIS — M810 Age-related osteoporosis without current pathological fracture: Secondary | ICD-10-CM | POA: Diagnosis not present

## 2020-07-06 DIAGNOSIS — I350 Nonrheumatic aortic (valve) stenosis: Secondary | ICD-10-CM | POA: Diagnosis not present

## 2020-07-06 DIAGNOSIS — M5030 Other cervical disc degeneration, unspecified cervical region: Secondary | ICD-10-CM | POA: Diagnosis not present

## 2020-07-06 DIAGNOSIS — I5032 Chronic diastolic (congestive) heart failure: Secondary | ICD-10-CM | POA: Diagnosis not present

## 2020-07-06 DIAGNOSIS — I130 Hypertensive heart and chronic kidney disease with heart failure and stage 1 through stage 4 chronic kidney disease, or unspecified chronic kidney disease: Secondary | ICD-10-CM | POA: Diagnosis not present

## 2020-07-06 DIAGNOSIS — E785 Hyperlipidemia, unspecified: Secondary | ICD-10-CM | POA: Diagnosis not present

## 2020-07-06 DIAGNOSIS — H269 Unspecified cataract: Secondary | ICD-10-CM | POA: Diagnosis not present

## 2020-07-06 DIAGNOSIS — H9313 Tinnitus, bilateral: Secondary | ICD-10-CM | POA: Diagnosis not present

## 2020-07-06 DIAGNOSIS — G8929 Other chronic pain: Secondary | ICD-10-CM | POA: Diagnosis not present

## 2020-07-06 DIAGNOSIS — D631 Anemia in chronic kidney disease: Secondary | ICD-10-CM | POA: Diagnosis not present

## 2020-07-06 DIAGNOSIS — M24561 Contracture, right knee: Secondary | ICD-10-CM | POA: Diagnosis not present

## 2020-07-06 DIAGNOSIS — M24562 Contracture, left knee: Secondary | ICD-10-CM | POA: Diagnosis not present

## 2020-07-06 NOTE — Progress Notes (Signed)
Internal Medicine Clinic Attending  Case discussed with Dr. Jinwala  At the time of the visit.  We reviewed the resident's history and exam and pertinent patient test results.  I agree with the assessment, diagnosis, and plan of care documented in the resident's note.  

## 2020-07-09 ENCOUNTER — Other Ambulatory Visit: Payer: Self-pay | Admitting: Internal Medicine

## 2020-07-09 DIAGNOSIS — E559 Vitamin D deficiency, unspecified: Secondary | ICD-10-CM

## 2020-07-11 DIAGNOSIS — G8929 Other chronic pain: Secondary | ICD-10-CM | POA: Diagnosis not present

## 2020-07-11 DIAGNOSIS — I5032 Chronic diastolic (congestive) heart failure: Secondary | ICD-10-CM | POA: Diagnosis not present

## 2020-07-11 DIAGNOSIS — D631 Anemia in chronic kidney disease: Secondary | ICD-10-CM | POA: Diagnosis not present

## 2020-07-11 DIAGNOSIS — M810 Age-related osteoporosis without current pathological fracture: Secondary | ICD-10-CM | POA: Diagnosis not present

## 2020-07-11 DIAGNOSIS — H9313 Tinnitus, bilateral: Secondary | ICD-10-CM | POA: Diagnosis not present

## 2020-07-11 DIAGNOSIS — N183 Chronic kidney disease, stage 3 unspecified: Secondary | ICD-10-CM | POA: Diagnosis not present

## 2020-07-11 DIAGNOSIS — H269 Unspecified cataract: Secondary | ICD-10-CM | POA: Diagnosis not present

## 2020-07-11 DIAGNOSIS — M5030 Other cervical disc degeneration, unspecified cervical region: Secondary | ICD-10-CM | POA: Diagnosis not present

## 2020-07-11 DIAGNOSIS — I350 Nonrheumatic aortic (valve) stenosis: Secondary | ICD-10-CM | POA: Diagnosis not present

## 2020-07-11 DIAGNOSIS — M24561 Contracture, right knee: Secondary | ICD-10-CM | POA: Diagnosis not present

## 2020-07-11 DIAGNOSIS — M79672 Pain in left foot: Secondary | ICD-10-CM | POA: Diagnosis not present

## 2020-07-11 DIAGNOSIS — I700 Atherosclerosis of aorta: Secondary | ICD-10-CM | POA: Diagnosis not present

## 2020-07-11 DIAGNOSIS — I441 Atrioventricular block, second degree: Secondary | ICD-10-CM | POA: Diagnosis not present

## 2020-07-11 DIAGNOSIS — M24562 Contracture, left knee: Secondary | ICD-10-CM | POA: Diagnosis not present

## 2020-07-11 DIAGNOSIS — M47816 Spondylosis without myelopathy or radiculopathy, lumbar region: Secondary | ICD-10-CM | POA: Diagnosis not present

## 2020-07-11 DIAGNOSIS — M1990 Unspecified osteoarthritis, unspecified site: Secondary | ICD-10-CM | POA: Diagnosis not present

## 2020-07-11 DIAGNOSIS — E785 Hyperlipidemia, unspecified: Secondary | ICD-10-CM | POA: Diagnosis not present

## 2020-07-11 DIAGNOSIS — I130 Hypertensive heart and chronic kidney disease with heart failure and stage 1 through stage 4 chronic kidney disease, or unspecified chronic kidney disease: Secondary | ICD-10-CM | POA: Diagnosis not present

## 2020-07-11 DIAGNOSIS — M79671 Pain in right foot: Secondary | ICD-10-CM | POA: Diagnosis not present

## 2020-07-11 DIAGNOSIS — K219 Gastro-esophageal reflux disease without esophagitis: Secondary | ICD-10-CM | POA: Diagnosis not present

## 2020-07-11 DIAGNOSIS — I2510 Atherosclerotic heart disease of native coronary artery without angina pectoris: Secondary | ICD-10-CM | POA: Diagnosis not present

## 2020-07-12 ENCOUNTER — Other Ambulatory Visit: Payer: Self-pay

## 2020-07-12 DIAGNOSIS — M810 Age-related osteoporosis without current pathological fracture: Secondary | ICD-10-CM

## 2020-07-12 MED ORDER — ALENDRONATE SODIUM 70 MG PO TABS: ORAL_TABLET | ORAL | 0 refills | Status: DC

## 2020-07-13 DIAGNOSIS — M24561 Contracture, right knee: Secondary | ICD-10-CM | POA: Diagnosis not present

## 2020-07-13 DIAGNOSIS — M810 Age-related osteoporosis without current pathological fracture: Secondary | ICD-10-CM | POA: Diagnosis not present

## 2020-07-13 DIAGNOSIS — I700 Atherosclerosis of aorta: Secondary | ICD-10-CM | POA: Diagnosis not present

## 2020-07-13 DIAGNOSIS — I441 Atrioventricular block, second degree: Secondary | ICD-10-CM | POA: Diagnosis not present

## 2020-07-13 DIAGNOSIS — I5032 Chronic diastolic (congestive) heart failure: Secondary | ICD-10-CM | POA: Diagnosis not present

## 2020-07-13 DIAGNOSIS — E785 Hyperlipidemia, unspecified: Secondary | ICD-10-CM | POA: Diagnosis not present

## 2020-07-13 DIAGNOSIS — D631 Anemia in chronic kidney disease: Secondary | ICD-10-CM | POA: Diagnosis not present

## 2020-07-13 DIAGNOSIS — M24562 Contracture, left knee: Secondary | ICD-10-CM | POA: Diagnosis not present

## 2020-07-13 DIAGNOSIS — G8929 Other chronic pain: Secondary | ICD-10-CM | POA: Diagnosis not present

## 2020-07-13 DIAGNOSIS — I130 Hypertensive heart and chronic kidney disease with heart failure and stage 1 through stage 4 chronic kidney disease, or unspecified chronic kidney disease: Secondary | ICD-10-CM | POA: Diagnosis not present

## 2020-07-13 DIAGNOSIS — M79672 Pain in left foot: Secondary | ICD-10-CM | POA: Diagnosis not present

## 2020-07-13 DIAGNOSIS — M5030 Other cervical disc degeneration, unspecified cervical region: Secondary | ICD-10-CM | POA: Diagnosis not present

## 2020-07-13 DIAGNOSIS — I350 Nonrheumatic aortic (valve) stenosis: Secondary | ICD-10-CM | POA: Diagnosis not present

## 2020-07-13 DIAGNOSIS — M47816 Spondylosis without myelopathy or radiculopathy, lumbar region: Secondary | ICD-10-CM | POA: Diagnosis not present

## 2020-07-13 DIAGNOSIS — M1990 Unspecified osteoarthritis, unspecified site: Secondary | ICD-10-CM | POA: Diagnosis not present

## 2020-07-13 DIAGNOSIS — I2510 Atherosclerotic heart disease of native coronary artery without angina pectoris: Secondary | ICD-10-CM | POA: Diagnosis not present

## 2020-07-13 DIAGNOSIS — K219 Gastro-esophageal reflux disease without esophagitis: Secondary | ICD-10-CM | POA: Diagnosis not present

## 2020-07-13 DIAGNOSIS — H269 Unspecified cataract: Secondary | ICD-10-CM | POA: Diagnosis not present

## 2020-07-13 DIAGNOSIS — M79671 Pain in right foot: Secondary | ICD-10-CM | POA: Diagnosis not present

## 2020-07-13 DIAGNOSIS — N183 Chronic kidney disease, stage 3 unspecified: Secondary | ICD-10-CM | POA: Diagnosis not present

## 2020-07-13 DIAGNOSIS — H9313 Tinnitus, bilateral: Secondary | ICD-10-CM | POA: Diagnosis not present

## 2020-07-19 ENCOUNTER — Ambulatory Visit (INDEPENDENT_AMBULATORY_CARE_PROVIDER_SITE_OTHER): Payer: Medicare Other | Admitting: Internal Medicine

## 2020-07-19 ENCOUNTER — Encounter: Payer: Self-pay | Admitting: Internal Medicine

## 2020-07-19 DIAGNOSIS — N183 Chronic kidney disease, stage 3 unspecified: Secondary | ICD-10-CM | POA: Diagnosis not present

## 2020-07-19 DIAGNOSIS — M24562 Contracture, left knee: Secondary | ICD-10-CM | POA: Diagnosis not present

## 2020-07-19 DIAGNOSIS — I10 Essential (primary) hypertension: Secondary | ICD-10-CM | POA: Diagnosis not present

## 2020-07-19 DIAGNOSIS — M24561 Contracture, right knee: Secondary | ICD-10-CM | POA: Diagnosis not present

## 2020-07-19 DIAGNOSIS — I350 Nonrheumatic aortic (valve) stenosis: Secondary | ICD-10-CM | POA: Diagnosis not present

## 2020-07-19 DIAGNOSIS — M1990 Unspecified osteoarthritis, unspecified site: Secondary | ICD-10-CM | POA: Diagnosis not present

## 2020-07-19 DIAGNOSIS — I700 Atherosclerosis of aorta: Secondary | ICD-10-CM | POA: Diagnosis not present

## 2020-07-19 DIAGNOSIS — I5032 Chronic diastolic (congestive) heart failure: Secondary | ICD-10-CM | POA: Diagnosis not present

## 2020-07-19 DIAGNOSIS — M5030 Other cervical disc degeneration, unspecified cervical region: Secondary | ICD-10-CM | POA: Diagnosis not present

## 2020-07-19 DIAGNOSIS — K219 Gastro-esophageal reflux disease without esophagitis: Secondary | ICD-10-CM | POA: Diagnosis not present

## 2020-07-19 DIAGNOSIS — I130 Hypertensive heart and chronic kidney disease with heart failure and stage 1 through stage 4 chronic kidney disease, or unspecified chronic kidney disease: Secondary | ICD-10-CM | POA: Diagnosis not present

## 2020-07-19 DIAGNOSIS — H9313 Tinnitus, bilateral: Secondary | ICD-10-CM | POA: Diagnosis not present

## 2020-07-19 DIAGNOSIS — G8929 Other chronic pain: Secondary | ICD-10-CM | POA: Diagnosis not present

## 2020-07-19 DIAGNOSIS — M79671 Pain in right foot: Secondary | ICD-10-CM | POA: Diagnosis not present

## 2020-07-19 DIAGNOSIS — M47816 Spondylosis without myelopathy or radiculopathy, lumbar region: Secondary | ICD-10-CM | POA: Diagnosis not present

## 2020-07-19 DIAGNOSIS — M810 Age-related osteoporosis without current pathological fracture: Secondary | ICD-10-CM | POA: Diagnosis not present

## 2020-07-19 DIAGNOSIS — H269 Unspecified cataract: Secondary | ICD-10-CM | POA: Diagnosis not present

## 2020-07-19 DIAGNOSIS — M79672 Pain in left foot: Secondary | ICD-10-CM | POA: Diagnosis not present

## 2020-07-19 DIAGNOSIS — I2510 Atherosclerotic heart disease of native coronary artery without angina pectoris: Secondary | ICD-10-CM | POA: Diagnosis not present

## 2020-07-19 DIAGNOSIS — I441 Atrioventricular block, second degree: Secondary | ICD-10-CM | POA: Diagnosis not present

## 2020-07-19 DIAGNOSIS — D631 Anemia in chronic kidney disease: Secondary | ICD-10-CM | POA: Diagnosis not present

## 2020-07-19 DIAGNOSIS — E785 Hyperlipidemia, unspecified: Secondary | ICD-10-CM | POA: Diagnosis not present

## 2020-07-19 NOTE — Assessment & Plan Note (Signed)
Patient presents to the clinic for F/U on her blood pressure. She is currently on Lasix 40 mg tab daily, and her Lisinopril was recently increased to 40 mg daily at her last visit on 07/05/20. She has no complaints of medication side effects today. She has been taking her medications as directed and has brought a BP log today with BP ranges in the 120s-130s with several isolated [id number] systolic pressures. Her BP today was:  Vitals with BMI 07/19/2020 07/05/2020 07/05/2020  Height - - -  Weight 136 lbs 8 oz - (No Data)  BMI - - -  Systolic [id number]  Diastolic 62 52 55  Pulse 50 44 42   Her blood pressure has stabilized on her current regimen. Will have her continue her current medications and follow up in two months to see her new PCP.  - Continue Lasix 40 mg daily  - Continue Lisinopril 40 mg daily  - F/U in two months to meet new PCP - BMP at next visit

## 2020-07-19 NOTE — Patient Instructions (Addendum)
To Ms. Verdine,   It was a pleasure seeing you today! Your blood pressure is looking quite well. We will continue taking your Lasix and Lisinopril at the current dose. I will have you follow up with your new primary health care provider in 2 months. If you have any new concerns, please reach out to the clinic to establish an appointment before then. Have a good day!  Maudie Mercury, MD

## 2020-07-19 NOTE — Progress Notes (Signed)
   CC: Blood Pressure Check  HPI:  [name] is a 75 y.o. M/F, with a PMH noted below, who presents to the clinic Blood Pressure Check. To see the management of their acute and chronic conditions, please see the A&P note under the Encounters tab.   Past Medical History:  Diagnosis Date  . AKI (acute kidney injury) ([place]) 01/31/2020  . Anemia   . Aortic stenosis   . Arthritis   . Bilateral lower extremity edema   . Bradycardia    a. 01/2013 - asymptomatic.  [name] Chronic diastolic heart failure ([place])   . CKD (chronic kidney disease)    a. baseline CKD stage III  . Coronary artery disease   . GERD (gastroesophageal reflux disease)   . Headache(784.0)   . Heart block AV second degree 02/15/2013  . Heart murmur   . Hyperlipidemia   . Hypertension   . Mobitz (type) I (Wenckebach's) atrioventricular block    a. 01/2013 - asymptomatic.  . Morbid obesity ([place])   . Seizure disorder ([place])   . Seizures ([place])   . TIA (transient ischemic attack) 2014   pt stated she had "mini strokes"   Review of Systems:   Review of Systems  Constitutional: Negative for chills and weight loss.  Gastrointestinal: Negative for abdominal pain, constipation, diarrhea, nausea and vomiting.  Genitourinary: Negative for dysuria, frequency, hematuria and urgency.  Musculoskeletal: Negative for myalgias.  Neurological: Negative for dizziness and headaches.     Physical Exam:  Vitals:   07/19/20 1353  BP: 136/62  Pulse: (!) 50  Temp: 98 F (36.7 C)  TempSrc: Oral  SpO2: 100%  Weight: 136 lb 8 oz (61.9 kg)   Physical Exam Constitutional:      Comments: Very pleasant female, resting comfortably in her wheelchair. Answers questions appropriately.  Cardiovascular:     Rate and Rhythm: Regular rhythm. Bradycardia present.     Pulses: Normal pulses.     Heart sounds: Murmur heard.  No friction rub. No gallop.      Comments: Systolic murmur appreciated at the R second intercostal space.  2+ Pulmonary:     Effort: Pulmonary effort is normal.     Breath sounds: Normal breath sounds. No wheezing, rhonchi or rales.      Assessment & Plan:   See Encounters Tab for problem based charting.  Patient discussed with Dr. Jimmye Norman

## 2020-07-21 DIAGNOSIS — H269 Unspecified cataract: Secondary | ICD-10-CM | POA: Diagnosis not present

## 2020-07-21 DIAGNOSIS — M79672 Pain in left foot: Secondary | ICD-10-CM | POA: Diagnosis not present

## 2020-07-21 DIAGNOSIS — I2510 Atherosclerotic heart disease of native coronary artery without angina pectoris: Secondary | ICD-10-CM | POA: Diagnosis not present

## 2020-07-21 DIAGNOSIS — I441 Atrioventricular block, second degree: Secondary | ICD-10-CM | POA: Diagnosis not present

## 2020-07-21 DIAGNOSIS — I700 Atherosclerosis of aorta: Secondary | ICD-10-CM | POA: Diagnosis not present

## 2020-07-21 DIAGNOSIS — E785 Hyperlipidemia, unspecified: Secondary | ICD-10-CM | POA: Diagnosis not present

## 2020-07-21 DIAGNOSIS — I350 Nonrheumatic aortic (valve) stenosis: Secondary | ICD-10-CM | POA: Diagnosis not present

## 2020-07-21 DIAGNOSIS — M5030 Other cervical disc degeneration, unspecified cervical region: Secondary | ICD-10-CM | POA: Diagnosis not present

## 2020-07-21 DIAGNOSIS — M1990 Unspecified osteoarthritis, unspecified site: Secondary | ICD-10-CM | POA: Diagnosis not present

## 2020-07-21 DIAGNOSIS — M24561 Contracture, right knee: Secondary | ICD-10-CM | POA: Diagnosis not present

## 2020-07-21 DIAGNOSIS — G8929 Other chronic pain: Secondary | ICD-10-CM | POA: Diagnosis not present

## 2020-07-21 DIAGNOSIS — M810 Age-related osteoporosis without current pathological fracture: Secondary | ICD-10-CM | POA: Diagnosis not present

## 2020-07-21 DIAGNOSIS — M24562 Contracture, left knee: Secondary | ICD-10-CM | POA: Diagnosis not present

## 2020-07-21 DIAGNOSIS — K219 Gastro-esophageal reflux disease without esophagitis: Secondary | ICD-10-CM | POA: Diagnosis not present

## 2020-07-21 DIAGNOSIS — H9313 Tinnitus, bilateral: Secondary | ICD-10-CM | POA: Diagnosis not present

## 2020-07-21 DIAGNOSIS — M79671 Pain in right foot: Secondary | ICD-10-CM | POA: Diagnosis not present

## 2020-07-21 DIAGNOSIS — I130 Hypertensive heart and chronic kidney disease with heart failure and stage 1 through stage 4 chronic kidney disease, or unspecified chronic kidney disease: Secondary | ICD-10-CM | POA: Diagnosis not present

## 2020-07-21 DIAGNOSIS — I5032 Chronic diastolic (congestive) heart failure: Secondary | ICD-10-CM | POA: Diagnosis not present

## 2020-07-21 DIAGNOSIS — M47816 Spondylosis without myelopathy or radiculopathy, lumbar region: Secondary | ICD-10-CM | POA: Diagnosis not present

## 2020-07-21 DIAGNOSIS — D631 Anemia in chronic kidney disease: Secondary | ICD-10-CM | POA: Diagnosis not present

## 2020-07-21 DIAGNOSIS — N183 Chronic kidney disease, stage 3 unspecified: Secondary | ICD-10-CM | POA: Diagnosis not present

## 2020-08-03 ENCOUNTER — Encounter: Payer: Self-pay | Admitting: *Deleted

## 2020-08-06 ENCOUNTER — Other Ambulatory Visit: Payer: Self-pay | Admitting: Internal Medicine

## 2020-08-13 ENCOUNTER — Other Ambulatory Visit: Payer: Self-pay | Admitting: Student

## 2020-08-13 DIAGNOSIS — E559 Vitamin D deficiency, unspecified: Secondary | ICD-10-CM

## 2020-08-22 ENCOUNTER — Encounter: Payer: Self-pay | Admitting: *Deleted

## 2020-08-28 ENCOUNTER — Other Ambulatory Visit: Payer: Self-pay | Admitting: Internal Medicine

## 2020-08-28 DIAGNOSIS — E559 Vitamin D deficiency, unspecified: Secondary | ICD-10-CM

## 2020-08-29 ENCOUNTER — Other Ambulatory Visit: Payer: Self-pay | Admitting: Student

## 2020-08-29 DIAGNOSIS — E559 Vitamin D deficiency, unspecified: Secondary | ICD-10-CM

## 2020-09-25 ENCOUNTER — Other Ambulatory Visit (INDEPENDENT_AMBULATORY_CARE_PROVIDER_SITE_OTHER): Payer: Medicare Other

## 2020-09-25 ENCOUNTER — Other Ambulatory Visit: Payer: Self-pay | Admitting: Internal Medicine

## 2020-09-25 DIAGNOSIS — E559 Vitamin D deficiency, unspecified: Secondary | ICD-10-CM

## 2020-09-25 DIAGNOSIS — I10 Essential (primary) hypertension: Secondary | ICD-10-CM

## 2020-09-26 LAB — PTH, INTACT AND CALCIUM
Calcium: 8.4 mg/dL — ABNORMAL LOW (ref 8.7–10.3)
PTH: 57 pg/mL (ref 15–65)

## 2020-09-26 LAB — VITAMIN D 25 HYDROXY (VIT D DEFICIENCY, FRACTURES): Vit D, 25-Hydroxy: 37.9 ng/mL (ref 30.0–100.0)

## 2020-10-03 ENCOUNTER — Encounter (HOSPITAL_COMMUNITY): Payer: Medicare Other | Admitting: Cardiology

## 2020-10-08 ENCOUNTER — Other Ambulatory Visit: Payer: Self-pay | Admitting: Student

## 2020-10-08 DIAGNOSIS — M810 Age-related osteoporosis without current pathological fracture: Secondary | ICD-10-CM

## 2020-10-16 ENCOUNTER — Other Ambulatory Visit: Payer: Self-pay | Admitting: *Deleted

## 2020-10-16 MED ORDER — FUROSEMIDE 40 MG PO TABS: 40.0000 mg | ORAL_TABLET | Freq: Every day | ORAL | 3 refills | Status: DC

## 2020-10-16 MED ORDER — POTASSIUM CHLORIDE ER 10 MEQ PO TBCR: 10.0000 meq | EXTENDED_RELEASE_TABLET | Freq: Every day | ORAL | 3 refills | Status: DC

## 2020-10-17 ENCOUNTER — Other Ambulatory Visit: Payer: Self-pay

## 2020-10-17 MED ORDER — PHENOBARBITAL 64.8 MG PO TABS: 129.6000 mg | ORAL_TABLET | Freq: Every day | ORAL | 3 refills | Status: DC

## 2020-10-17 MED ORDER — ZONISAMIDE 100 MG PO CAPS: ORAL_CAPSULE | ORAL | 3 refills | Status: DC

## 2020-10-18 ENCOUNTER — Other Ambulatory Visit: Payer: Self-pay | Admitting: *Deleted

## 2020-10-18 DIAGNOSIS — M810 Age-related osteoporosis without current pathological fracture: Secondary | ICD-10-CM

## 2020-10-18 DIAGNOSIS — E782 Mixed hyperlipidemia: Secondary | ICD-10-CM

## 2020-10-18 MED ORDER — ALENDRONATE SODIUM 70 MG PO TABS: ORAL_TABLET | ORAL | 0 refills | Status: DC

## 2020-10-18 MED ORDER — LISINOPRIL 40 MG PO TABS: 40.0000 mg | ORAL_TABLET | Freq: Every day | ORAL | 3 refills | Status: DC

## 2020-10-18 MED ORDER — ROSUVASTATIN CALCIUM 10 MG PO TABS: 10.0000 mg | ORAL_TABLET | Freq: Every day | ORAL | 2 refills | Status: DC

## 2020-10-18 NOTE — Telephone Encounter (Signed)
Incoming fax from [place] (confirmed with pt) Will send request to pcp/team  Of note, some prescriptions (potassium, phenobarbital, zonisamde, furosemide) have already been sent in from other offices (pt aware)  Pt gets vitamins and asa otc

## 2020-10-26 ENCOUNTER — Ambulatory Visit (INDEPENDENT_AMBULATORY_CARE_PROVIDER_SITE_OTHER): Payer: Medicare Other | Admitting: Internal Medicine

## 2020-10-26 ENCOUNTER — Other Ambulatory Visit: Payer: Self-pay

## 2020-10-26 VITALS — BP 168/61 | HR 50 | Temp 98.0°F | Wt 139.3 lb

## 2020-10-26 DIAGNOSIS — K219 Gastro-esophageal reflux disease without esophagitis: Secondary | ICD-10-CM | POA: Diagnosis not present

## 2020-10-26 DIAGNOSIS — M810 Age-related osteoporosis without current pathological fracture: Secondary | ICD-10-CM | POA: Diagnosis not present

## 2020-10-26 DIAGNOSIS — E782 Mixed hyperlipidemia: Secondary | ICD-10-CM | POA: Diagnosis not present

## 2020-10-26 DIAGNOSIS — Z1211 Encounter for screening for malignant neoplasm of colon: Secondary | ICD-10-CM | POA: Diagnosis not present

## 2020-10-26 DIAGNOSIS — Z23 Encounter for immunization: Secondary | ICD-10-CM

## 2020-10-26 DIAGNOSIS — R569 Unspecified convulsions: Secondary | ICD-10-CM

## 2020-10-26 DIAGNOSIS — Z0000 Encounter for general adult medical examination without abnormal findings: Secondary | ICD-10-CM

## 2020-10-26 DIAGNOSIS — G40909 Epilepsy, unspecified, not intractable, without status epilepticus: Secondary | ICD-10-CM

## 2020-10-26 DIAGNOSIS — I10 Essential (primary) hypertension: Secondary | ICD-10-CM | POA: Diagnosis not present

## 2020-10-26 MED ORDER — PANTOPRAZOLE SODIUM 40 MG PO TBEC: 40.0000 mg | DELAYED_RELEASE_TABLET | Freq: Every day | ORAL | 3 refills | Status: DC

## 2020-10-26 MED ORDER — DENOSUMAB 60 MG/ML ~~LOC~~ SOSY: 60.0000 mg | PREFILLED_SYRINGE | SUBCUTANEOUS | 0 refills | Status: DC

## 2020-10-26 NOTE — Patient Instructions (Addendum)
Sherry [name], it was a pleasure seeing you today!  Today we discussed: High Blood Pressure: Your blood pressure was elevated in the clinic but it is well controlled at home. We will continue your current medicines.  Epilepsy: Continue your current medicines. We will place a referral to a neurologist to see if you can optimizer your medicines.  HLD: Continue Crestor GERD: You endorsed having significant acid reflux. We will discontinue your Fosamax and start a new medicine that is given in the clinic every 6 month. We will start Pantoprazole 40 mg daily.  Prevention: We will give you a flu shot today. We will order FIT test to check for colon cancer.   I have ordered the following labs today:  Lab Orders         Fecal occult blood, imunochemical         BMP8+Anion Gap      Referrals ordered today:   Referral Orders  No referral(s) requested today     I have ordered the following medication/changed the following medications:   Stop the following medications: Medications Discontinued During This Encounter  Medication Reason   alendronate (FOSAMAX) 70 MG tablet      Start the following medications: Meds ordered this encounter  Medications   pantoprazole (PROTONIX) 40 MG tablet    Sig: Take 1 tablet (40 mg total) by mouth daily.    Dispense:  90 tablet    Refill:  3     Follow-up: 6 months   Please make sure to arrive 15 minutes prior to your next appointment. If you arrive late, you may be asked to reschedule.   We look forward to seeing you next time. Please call our clinic at [phone] if you have any questions or concerns. The best time to call is Monday-Friday from 9am-4pm, but there is someone available 24/7. If after hours or the weekend, call the main hospital number and ask for the Internal Medicine Resident On-Call. If you need medication refills, please notify your pharmacy one week in advance and they will send [place] a request.  Thank you for letting us  take part in your care. Wishing you the best!  Thank you, Idamae Schuller, MD

## 2020-10-29 ENCOUNTER — Encounter: Payer: Self-pay | Admitting: Internal Medicine

## 2020-10-29 NOTE — Assessment & Plan Note (Signed)
Assessment: Patient's blood pressure in the clinic was elevated but the log brought to the clinic showed controlled blood pressure. We will keep the same regimen as of right now.   Plan: -Lasix 40 mg daily -Lisinopril 40 mg daily -BMP today

## 2020-10-29 NOTE — Progress Notes (Signed)
   CC: follow up  HPI:  [name] is a 75 y.o. with medical history as below presenting to [facility] for follow up.  Please see problem-based list for further details, assessments, and plans.  Past Medical History:  Diagnosis Date   AKI (acute kidney injury) ([place]) 01/31/2020   Anemia    Aortic stenosis    Arthritis    Bilateral lower extremity edema    Bradycardia    a. 01/2013 - asymptomatic.   Chronic diastolic heart failure ([facility])    CKD (chronic kidney disease)    a. baseline CKD stage III   Coronary artery disease    GERD (gastroesophageal reflux disease)    Headache(784.0)    Heart block AV second degree 02/15/2013   Heart murmur    Hyperlipidemia    Hypertension    Mobitz (type) I (Wenckebach's) atrioventricular block    a. 01/2013 - asymptomatic.   Morbid obesity ([place])    Seizure disorder ([place])    Seizures ([place])    TIA (transient ischemic attack) 2014   pt stated she had "mini strokes"   Review of Systems: Negative unless stated in the problem list.   Physical Exam:  Vitals:   10/26/20 0930  BP: (!) 168/61  Pulse: (!) 50  Temp: 98 F (36.7 C)  TempSrc: Oral  SpO2: 100%  Weight: 139 lb 4.8 oz (63.2 kg)    Physical Exam Constitutional:      Appearance: Normal appearance.  HENT:     Head: Normocephalic and atraumatic.     Right Ear: External ear normal.     Left Ear: External ear normal.     Nose: Nose normal. No rhinorrhea.     Mouth/Throat:     Mouth: Mucous membranes are moist.     Pharynx: Oropharynx is clear. No posterior oropharyngeal erythema.  Eyes:     Extraocular Movements: Extraocular movements intact.     Pupils: Pupils are equal, round, and reactive to light.  Cardiovascular:     Rate and Rhythm: Regular rhythm. Bradycardia present.     Pulses: Normal pulses.     Heart sounds: Normal heart sounds.  Pulmonary:     Effort: Pulmonary effort is normal.     Breath sounds: Normal breath sounds.  Abdominal:     General: Bowel  sounds are normal. There is no distension.     Palpations: Abdomen is soft.     Tenderness: There is no abdominal tenderness.  Musculoskeletal:        General: No swelling or tenderness. Normal range of motion.     Cervical back: Normal range of motion and neck supple.     Right lower leg: No edema.     Left lower leg: No edema.  Skin:    General: Skin is warm and dry.     Capillary Refill: Capillary refill takes less than 2 seconds.     Findings: No erythema.  Neurological:     General: No focal deficit present.     Mental Status: She is alert.  Psychiatric:        Mood and Affect: Mood normal.        Behavior: Behavior normal.    Assessment & Plan:   See Encounters Tab for problem based charting.  Patient seen with Dr. Odella Aquas, MD

## 2020-10-29 NOTE — Assessment & Plan Note (Signed)
Assessment: Patient has osteoporosis and is on alendronate. She has symptoms of GERD and for esophageal protection, she would benefit from discontinuation of this medicine. Patient provided counseling on this and wishes to proceed with Prolia q6 months.   Plan: -Stop Alendroate -Start Prolia q6 months

## 2020-10-29 NOTE — Assessment & Plan Note (Signed)
Assessment: Patient has epilepsy but has not had a seizure for more than 15 years. She is on phenobarbital 129.6 mg daily at bedtime and Zonisamide 200 mg qhs which she takes regularly. Advise patient to follow up with her neurologist to see if any adjustments need to be made to her regimen.   Plan: -Continue current meds -Follow up with neurology

## 2020-10-29 NOTE — Assessment & Plan Note (Signed)
Assessment: Patient has a history of HLD and is on Crestor 10 mg daily. Last year patient's LDL was elevated but total cholesterol was <200. We will repeat lipid panel next visit and adjust if needed.   Lipid Panel     Component Value Date/Time   CHOL 189 01/27/2020 1032   TRIG 104 01/27/2020 1032   HDL 46 01/27/2020 1032   CHOLHDL 4.1 01/27/2020 1032   VLDL 21 01/27/2020 1032   LDLCALC 122 (H) 01/27/2020 1032   Plan: -Continue Crestor 10 mg daily -Repeat lipid panel next visit

## 2020-10-29 NOTE — Assessment & Plan Note (Signed)
FIT test ordered today. Last one was negative in 2021.

## 2020-10-29 NOTE — Assessment & Plan Note (Signed)
Assessment: Patient endorsed a burning sensation in her chest area after eating and especially after laying down. This appears consistent with GERD. We will trail with Protonix 40 mg and see if it resolves.   Plan: -Protonix 40 mg daily -Discontinue alendronate for esophagus protection.

## 2020-10-30 ENCOUNTER — Other Ambulatory Visit: Payer: Self-pay | Admitting: *Deleted

## 2020-10-30 DIAGNOSIS — M810 Age-related osteoporosis without current pathological fracture: Secondary | ICD-10-CM

## 2020-10-30 MED ORDER — DENOSUMAB 60 MG/ML ~~LOC~~ SOSY: 60.0000 mg | PREFILLED_SYRINGE | SUBCUTANEOUS | 0 refills | Status: DC

## 2020-10-30 NOTE — Telephone Encounter (Signed)
Call from [facility] They received rx for denosumab (prolia), but claim is being rejected due to prescribing provider. Pharmacy requesting a new rx from a provider "covered under state medicare" Will have attending MD resend rx

## 2020-10-31 NOTE — Progress Notes (Signed)
Internal Medicine Clinic Attending  I saw and evaluated the patient.  I personally confirmed the key portions of the history and exam documented by Dr.  Humphrey Rolls  and I reviewed pertinent patient test results.  The assessment, diagnosis, and plan were formulated together and I agree with the documentation in the resident's note.   Patient also reported occasional pain with swallowing pills.  This along with worsening GERD symptoms make concern for future pill esophagitis with alendronate stronger.  Will switch to Denosumab injections.

## 2020-11-01 ENCOUNTER — Other Ambulatory Visit (INDEPENDENT_AMBULATORY_CARE_PROVIDER_SITE_OTHER): Payer: Medicare Other

## 2020-11-01 DIAGNOSIS — I10 Essential (primary) hypertension: Secondary | ICD-10-CM

## 2020-11-02 LAB — BMP8+ANION GAP
Anion Gap: 18 mmol/L (ref 10.0–18.0)
BUN/Creatinine Ratio: 33 — ABNORMAL HIGH (ref 12–28)
BUN: 41 mg/dL — ABNORMAL HIGH (ref 8–27)
CO2: 18 mmol/L — ABNORMAL LOW (ref 20–29)
Calcium: 8.9 mg/dL (ref 8.7–10.3)
Chloride: 104 mmol/L (ref 96–106)
Creatinine, Ser: 1.26 mg/dL — ABNORMAL HIGH (ref 0.57–1.00)
Glucose: 71 mg/dL (ref 65–99)
Potassium: 4.9 mmol/L (ref 3.5–5.2)
Sodium: 140 mmol/L (ref 134–144)
eGFR: 45 mL/min/{1.73_m2} — ABNORMAL LOW (ref >59)

## 2020-11-02 NOTE — Telephone Encounter (Signed)
Received 4 way call from [place] [employer] manager) from [facility], and patient. [facility] states they need an office-based exception to ship Prolia to patient's home instead of our office. Patient understands that Prolia will be shipped to her and was instructed by Joelene Millin to keep refrigerated. Once she receives the med, she will call our office to schedule Nurse visit to have med administered.

## 2020-11-06 NOTE — Progress Notes (Signed)
Cardiology Office Note:    Date:  11/07/2020   ID:  Charlissa, Petros April 06, 1945, MRN [id number]  PCP:  Idamae Schuller, MD  [facility] Cardiologist:  Sherren Mocha, MD     Referring MD: Marty Heck, DO   Chief Complaint:  F/u on CAD, AS, CHF    Patient Profile:   NAYZETH ALTMAN is a 75 y.o. female with:  Coronary artery disease  Cath 2015: mod to severe 3 v CAD >> Med Rx  Aortic stenosis Severe, stage D (paradoxical low flow low gradient) AS Has remained asymptomatic; marked functional limitation; combo of CAD+AS >> observation  (HFpEF) heart failure with preserved ejection fraction  Bradycardia  1st degree AVB, Mobitz I, Transient heart block Monitor 9/21:  no high grade HB Hypertension  Hypertrophic cardiomyopathy  Severe concentric LVH ?Cardiac Amyloid CMR 11/19: sugg of cardiac amyloid PYP: neg for TTR amyloid SPEP/UPEP neg Pt did not follow through with seeing AHF Clinic  Chronic kidney disease  Hyperlipidemia  GERD Seizure disorder Hx of TIA   Prior CV studies: Echocardiogram 02/03/20 Moderate LVH with speckled appearance, EF 60-65, calcified aortic valve, moderate pericardial effusion, no tamponade physiology, moderate LAE, severe MAC  RIGHT HEART CATH, RIGHT HEART CATH 02/01/2020 Narrative Findings: Ao = 108/56 (76) LV = 136/4 RA =  2 RV = 27/7 PA = 30/2 (13) PCW = 5 Fick cardiac output/index = 4.3/2.7 Thermo CO/CI = 4.1/2.5 PVR = 2.0 WU FA sat = 99% PA sat = 66% No RV/LV interaction AoV: mean gradient 58mHG AVA 1.47 cm2 (Fick) 1.38 cm2 (Thermo) Assessment: 1. Low filling pressures with normal output 2. Moderate aortic stenosis  LONG TERM MONITOR (3-7 DAYS) INTERPRETATION 10/29/2019 Narrative 1. The basic rhythm is normal sinus with an average HR of 51 bpm with first degree AV block (no second or third degree AV block present) 2. No atrial fibrillation or flutter 3. No high-grade heart block or pathologic  pauses 4. There are rare PVC's and no supraventricular beats 5. There is a 16 beat run of NSVT   PYP Scan 01/09/18 H/CL ratio is 1.08. By semi-quantitative assessment there is Grade 0 that represents no uptake and normal bone uptake. There are hot spots with increased radiotracer uptake in both kidneys. Further evaluation suggested The study is normal and not suggestive of TRR amyloidosis.  Cardiac MRI 12/24/17 IMPRESSION: 1. This study is suggestive of cardiac amyloidosis. There is a moderate pericardial effusion (without tamponade), moderate concentric LV hypertrophy, and on delayed enhancement imaging, the myocardium is difficult to null with a suggestion of diffuse subendocardial LGE. 2. There is aortic stenosis present, the degree would be better assessed by echo.  Cardiac catheterization 05/24/13 ANGIOGRAPHIC DATA:    The left main coronary artery is widely patent.   The left anterior descending artery has moderate to heavy proximal and mid calcification with tandem 80 and 50% stenoses noted after the large first septal perforator. The LAD wraps around the left ventricular apex..   The left circumflex artery is large, giving origin to 4 obtuse marginal branches. The second obtuse marginal is the dominant vessel and bifurcates on the mid lateral wall. The first obtuse marginal is small and contains ostial 70-80% narrowing. The mid circumflex contains an eccentric 80% stenosis within a heavily calcified segment. There is 30-50% narrowing in the ostium of the second obtuse marginal. The continuation of the circumflex beyond the second obtuse marginal contains segmental 85% stenosis before the origin of thE third and  fourth obtuse marginalS.   The right coronary artery is dominant and contains severe segmental mid to distal disease with stenoses up to 90%.   LEFT VENTRICULOGRAM:   Left ventricular angiogram was done in the 30 RAO projection and revealed normal to small LV cavity with  hyperdynamic contractility and an estimated ejection fraction of 70-80%. This is artificially higher than felt due to post PVC increase in contractility.   IMPRESSIONS:   1. Moderate to severe three-vessel coronary disease as outlined above. The circumflex and right coronary are relatively diffusely diseased. 2. Aortic stenosis, with a surprisingly low transvalvular gradient based on direct hemodynamics. This would raise a question about the possibility of contamination of the aortic velocities on echo rom dynamic LV outflow obstruction. 3. Normal to hyperdynamic LV function. 4. Normal PA pressures.  History of Present Illness: Ms. Backer was last seen in 02/2020 by Leanor Kail, PA-C.  She was placed back on furosemide for management of HF.  She returns for f/u.  She is here today with her husband.  She is in a wheelchair.  Her mobility continues to be limited.  She can move around her house in a motorized scooter.  With this she can sweep or clean dishes.  She has not had chest pain, shortness of breath, orthopnea, syncope or significant leg edema.           Past Medical History:  Diagnosis Date   AKI (acute kidney injury) ([place]) 01/31/2020   Anemia    Aortic stenosis    Arthritis    Bilateral lower extremity edema    Bilateral lower extremity edema    Blurry vision, bilateral 05/08/2016   Bradycardia    a. 01/2013 - asymptomatic.   Chronic diastolic heart failure (HCC)    CKD (chronic kidney disease)    a. baseline CKD stage III   Coronary artery disease    GERD (gastroesophageal reflux disease)    Headache(784.0)    Heart block AV second degree 02/15/2013   Heart murmur    Hyperlipidemia    Hypertension    Hypotension    Hypovolemic shock ([place])    Internal and external hemorrhoids without complication 49/08/261   Colonoscopy in 2008 recs repeat in 5 years   Intractable focal epilepsy with impairment of consciousness ([place]) 01/02/2012   Mobitz (type) I (Wenckebach's)  atrioventricular block    a. 01/2013 - asymptomatic.   Morbid obesity ([place])    Seizure disorder ([place])    Seizures ([place])    TIA (transient ischemic attack) 2014   pt stated she had "mini strokes"   Vitamin D deficiency 04/22/2014   Current Medications: Current Meds  Medication Sig   aspirin 81 MG EC tablet Take 81 mg by mouth daily.   Calcium Carb-Cholecalciferol (CALCIUM 500 +D) 500-400 MG-UNIT TABS Take 1 tablet by mouth 2 (two) times daily.   denosumab (PROLIA) 60 MG/ML SOSY injection Inject 60 mg into the skin every 6 (six) months.   furosemide (LASIX) 40 MG tablet Take 1 tablet (40 mg total) by mouth daily.   lidocaine (LIDODERM) 5 % Place 1 patch onto the skin daily. Remove & Discard patch within 12 hours or as directed by MD   lisinopril (ZESTRIL) 40 MG tablet Take 1 tablet (40 mg total) by mouth daily.   mupirocin ointment (BACTROBAN) 2 % Place 1 application into the nose 2 (two) times daily.   pantoprazole (PROTONIX) 40 MG tablet Take 1 tablet (40 mg total) by mouth daily.  PHENobarbital (LUMINAL) 64.8 MG tablet Take 2 tablets (129.6 mg total) by mouth at bedtime.   potassium chloride (KLOR-CON) 10 MEQ tablet Take 1 tablet (10 mEq total) by mouth daily.   rosuvastatin (CRESTOR) 10 MG tablet Take 1 tablet (10 mg total) by mouth daily.   Vitamin D, Ergocalciferol, (DRISDOL) 1.25 MG (50000 UNIT) CAPS capsule TAKE ONE CAPSULE BY MOUTH ONCE WEEKLY   zonisamide (ZONEGRAN) 100 MG capsule Take 2 capsules every night    Allergies:   Ibuprofen   Social History   Tobacco Use   Smoking status: Former    Types: Cigarettes    Quit date: 06/03/2000    Years since quitting: 20.4   Smokeless tobacco: Never  Vaping Use   Vaping Use: Never used  Substance Use Topics   Alcohol use: No    Alcohol/week: 0.0 standard drinks   Drug use: No    Family Hx: The patient's family history includes Asthma in her sister; Cancer in her father; Heart disease in her mother, sister, and sister;  Hypertension in her mother and another family member; Obesity in her son.  Review of Systems  Musculoskeletal:  Positive for joint pain.    EKGs/Labs/Other Test Reviewed:    EKG:  EKG is not ordered today.  The ekg ordered today demonstrates n/a EKG from 07/05/2020 was personally reviewed and demonstrated sinus bradycardia, HR 40, PR 336, QTc 405, LVH with repolarization abnormality  Recent Labs: 01/31/2020: B Natriuretic Peptide 167.9; TSH 4.577 02/01/2020: ALT 42 02/02/2020: Magnesium 2.5 02/03/2020: Hemoglobin 10.0; Platelets 301 11/01/2020: BUN 41; Creatinine, Ser 1.26; Potassium 4.9; Sodium 140   Recent Lipid Panel Lab Results  Component Value Date/Time   CHOL 189 01/27/2020 10:32 AM   TRIG 104 01/27/2020 10:32 AM   HDL 46 01/27/2020 10:32 AM   LDLCALC 122 (H) 01/27/2020 10:32 AM     Risk Assessment/Calculations:          Physical Exam:    VS:  BP (!) 178/62   Pulse 65   Ht _0  (1.549 m)   Wt 136 lb (61.7 kg)   SpO2 99%   BMI 25.70 kg/m     Wt Readings from Last 3 Encounters:  11/07/20 136 lb (61.7 kg)  10/26/20 139 lb 4.8 oz (63.2 kg)  07/19/20 136 lb 8 oz (61.9 kg)    Constitutional:      Appearance: Healthy appearance. Not in distress.     Comments: Arrives in a wheelchair  Pulmonary:     Effort: Pulmonary effort is normal.     Breath sounds: No wheezing. No rales.  Cardiovascular:     Normal rate. Regular rhythm. Normal S1. Normal S2.      Murmurs: There is a grade 4/6 crescendo-decrescendo systolic murmur at the URSB.  Edema:    Peripheral edema absent.  Abdominal:     Palpations: Abdomen is soft.  Musculoskeletal:     Cervical back: Neck supple. Skin:    General: Skin is warm and dry.  Neurological:     Mental Status: Alert and oriented to person, place and time.     Cranial Nerves: Cranial nerves are intact.       ASSESSMENT & PLAN:   1. Coronary artery disease involving native coronary artery of native heart without angina  pectoris Moderate to severe multivessel CAD by cardiac catheterization in 2015.  This has been managed medically.  She is doing well without anginal symptoms.  Continue aspirin 81 mg daily, rosuvastatin 10 mg daily.  2. Severe aortic stenosis She has been evaluated by Dr. Burt Knack in the past.  She has severe stage D paradoxical low-flow low gradient aortic stenosis.  She has continued with observation based upon lack of symptoms, marked functional limitation and combination of coronary artery disease with her aortic stenosis.  She has not had any significant change in her status.  She has not had any red flag symptoms.  I will make sure she has a follow-up echocardiogram in December and follow-up with Dr. Burt Knack in 6 months.  3. Chronic heart failure with preserved ejection fraction ([place]) She is mainly limited by orthopedic issues.  Functional class is difficult to assess.  Volume status is currently stable.  Most recent creatinine was stable.  Continue furosemide 40 mg daily.  4. LVH (left ventricular hypertrophy) She has had evidence of speckled myocardium on echocardiogram in the past as well as findings consistent with cardiac amyloidosis on cardiac MRI.  PYP scan was negative.  SPEP/UPEP did not demonstrate protein spike.  She does have an appointment with Dr. Aundra Dubin next month.  I have encouraged her to keep this appointment.  5. Bradycardia She has had evidence of second-degree heart block type I.  Monitor in 9/21 did not demonstrate high-grade heart block.  She has not had any symptoms to suggest this.  No further testing is indicated at this time.  Avoid AV nodal blocking agents.  6. Essential hypertension She has not had any medications yet today.  Her blood pressures tend to run much better at home.  I have asked her to monitor her blood pressure over the next week and send readings through MyChart for review.  7. Stage 3a chronic kidney disease (HCC) Recent creatinine stable.   8.  Hyperlipidemia She is fasting today.  Obtain fasting lipids, LFTs.  Dispo:  Return in about 6 months (around 05/07/2021) for Routine Follow Up, w/ Dr. Burt Knack.   Medication Adjustments/Labs and Tests Ordered: Current medicines are reviewed at length with the patient today.  Concerns regarding medicines are outlined above.  Tests Ordered: Orders Placed This Encounter  Procedures   Lipid panel   Hepatic function panel   ECHOCARDIOGRAM COMPLETE   Medication Changes: No orders of the defined types were placed in this encounter.  Signed, Richardson Dopp, PA-C  11/07/2020 11:04 AM    [facility] [place], [place],   [address] Phone: [phone]; Fax: [phone]

## 2020-11-07 ENCOUNTER — Encounter: Payer: Self-pay | Admitting: Physician Assistant

## 2020-11-07 ENCOUNTER — Other Ambulatory Visit: Payer: Self-pay

## 2020-11-07 ENCOUNTER — Ambulatory Visit (INDEPENDENT_AMBULATORY_CARE_PROVIDER_SITE_OTHER): Payer: Medicare Other | Admitting: Physician Assistant

## 2020-11-07 VITALS — BP 178/62 | HR 65 | Ht 61.0 in | Wt 136.0 lb

## 2020-11-07 DIAGNOSIS — I10 Essential (primary) hypertension: Secondary | ICD-10-CM | POA: Diagnosis not present

## 2020-11-07 DIAGNOSIS — R001 Bradycardia, unspecified: Secondary | ICD-10-CM | POA: Diagnosis not present

## 2020-11-07 DIAGNOSIS — I5032 Chronic diastolic (congestive) heart failure: Secondary | ICD-10-CM

## 2020-11-07 DIAGNOSIS — I2510 Atherosclerotic heart disease of native coronary artery without angina pectoris: Secondary | ICD-10-CM

## 2020-11-07 DIAGNOSIS — I350 Nonrheumatic aortic (valve) stenosis: Secondary | ICD-10-CM

## 2020-11-07 DIAGNOSIS — E782 Mixed hyperlipidemia: Secondary | ICD-10-CM

## 2020-11-07 DIAGNOSIS — I517 Cardiomegaly: Secondary | ICD-10-CM

## 2020-11-07 DIAGNOSIS — N1831 Chronic kidney disease, stage 3a: Secondary | ICD-10-CM

## 2020-11-07 LAB — HEPATIC FUNCTION PANEL
ALT: 15 IU/L (ref 0–32)
AST: 29 IU/L (ref 0–40)
Albumin: 4.3 g/dL (ref 3.7–4.7)
Alkaline Phosphatase: 102 IU/L (ref 44–121)
Bilirubin Total: <0.2 mg/dL (ref 0.0–1.2)
Bilirubin, Direct: <0.1 mg/dL (ref 0.00–0.40)
Total Protein: 7 g/dL (ref 6.0–8.5)

## 2020-11-07 LAB — LIPID PANEL
Chol/HDL Ratio: 2.8 ratio (ref 0.0–4.4)
Cholesterol, Total: 163 mg/dL (ref 100–199)
HDL: 59 mg/dL (ref >39)
LDL Chol Calc (NIH): 86 mg/dL (ref 0–99)
Triglycerides: 100 mg/dL (ref 0–149)
VLDL Cholesterol Cal: 18 mg/dL (ref 5–40)

## 2020-11-07 NOTE — Patient Instructions (Addendum)
Medication Instructions:  Your physician recommends that you continue on your current medications as directed. Please refer to the Current Medication list given to you today.  *If you need a refill on your cardiac medications before your next appointment, please call your pharmacy*   Lab Work: TODAY:  LIPID & LFT  If you have labs (blood work) drawn today and your tests are completely normal, you will receive your results only by: [place] (if you have MyChart) OR A paper copy in the mail If you have any lab test that is abnormal or we need to change your treatment, we will call you to review the results.   Testing/Procedures: Your physician has requested that you have an echocardiogram IN [place]. Echocardiography is a painless test that uses sound waves to create images of your heart. It provides your doctor with information about the size and shape of your heart and how well your heart's chambers and valves are working. This procedure takes approximately one hour. There are no restrictions for this procedure.    Follow-Up: At [facility], you and your health needs are our priority.  As part of our continuing mission to provide you with exceptional heart care, we have created designated Provider Care Teams.  These Care Teams include your primary Cardiologist (physician) and Advanced Practice Providers (APPs -  Physician Assistants and Nurse Practitioners) who all work together to provide you with the care you need, when you need it.  We recommend signing up for the patient portal called "MyChart".  Sign up information is provided on this After Visit Summary.  MyChart is used to connect with patients for Virtual Visits (Telemedicine).  Patients are able to view lab/test results, encounter notes, upcoming appointments, etc.  Non-urgent messages can be sent to your provider as well.   To learn more about what you can do with MyChart, go to [email or web address].    Your next  appointment:   6 month(s)  The format for your next appointment:   In Person  Provider:   You may see Sherren Mocha, MD or one of the following Advanced Practice Providers on your designated Care Team:   Richardson Dopp, PA-C Robbie Lis, [place]   Other Instructions KEEP A LOG OF YOUR BLOOD PRESSURE FOR A WEEK AND SEND A [place].

## 2020-11-08 ENCOUNTER — Other Ambulatory Visit: Payer: Self-pay | Admitting: *Deleted

## 2020-11-08 DIAGNOSIS — I2510 Atherosclerotic heart disease of native coronary artery without angina pectoris: Secondary | ICD-10-CM

## 2020-11-08 DIAGNOSIS — E782 Mixed hyperlipidemia: Secondary | ICD-10-CM

## 2020-11-08 MED ORDER — ROSUVASTATIN CALCIUM 20 MG PO TABS: 20.0000 mg | ORAL_TABLET | Freq: Every day | ORAL | 3 refills | Status: DC

## 2020-11-09 ENCOUNTER — Other Ambulatory Visit: Payer: Medicare Other

## 2020-12-04 ENCOUNTER — Other Ambulatory Visit: Payer: Self-pay

## 2020-12-04 ENCOUNTER — Ambulatory Visit (HOSPITAL_COMMUNITY)
Admission: RE | Admit: 2020-12-04 | Discharge: 2020-12-04 | Disposition: A | Discharge status: Home / Self Care | Payer: Medicare Other | Source: Ambulatory Visit | Attending: Cardiology | Admitting: Cardiology

## 2020-12-04 ENCOUNTER — Encounter (HOSPITAL_COMMUNITY): Payer: Self-pay | Admitting: Cardiology

## 2020-12-04 VITALS — BP 128/60 | HR 48 | Wt 141.4 lb

## 2020-12-04 DIAGNOSIS — Z993 Dependence on wheelchair: Secondary | ICD-10-CM | POA: Insufficient documentation

## 2020-12-04 DIAGNOSIS — I350 Nonrheumatic aortic (valve) stenosis: Secondary | ICD-10-CM | POA: Diagnosis not present

## 2020-12-04 DIAGNOSIS — Z8673 Personal history of transient ischemic attack (TIA), and cerebral infarction without residual deficits: Secondary | ICD-10-CM | POA: Diagnosis not present

## 2020-12-04 DIAGNOSIS — Z7982 Long term (current) use of aspirin: Secondary | ICD-10-CM | POA: Diagnosis not present

## 2020-12-04 DIAGNOSIS — I3139 Other pericardial effusion (noninflammatory): Secondary | ICD-10-CM | POA: Diagnosis not present

## 2020-12-04 DIAGNOSIS — M1990 Unspecified osteoarthritis, unspecified site: Secondary | ICD-10-CM | POA: Diagnosis not present

## 2020-12-04 DIAGNOSIS — Z79899 Other long term (current) drug therapy: Secondary | ICD-10-CM | POA: Insufficient documentation

## 2020-12-04 DIAGNOSIS — I130 Hypertensive heart and chronic kidney disease with heart failure and stage 1 through stage 4 chronic kidney disease, or unspecified chronic kidney disease: Secondary | ICD-10-CM | POA: Diagnosis not present

## 2020-12-04 DIAGNOSIS — N183 Chronic kidney disease, stage 3 unspecified: Secondary | ICD-10-CM | POA: Insufficient documentation

## 2020-12-04 DIAGNOSIS — I5032 Chronic diastolic (congestive) heart failure: Secondary | ICD-10-CM | POA: Insufficient documentation

## 2020-12-04 DIAGNOSIS — Z8249 Family history of ischemic heart disease and other diseases of the circulatory system: Secondary | ICD-10-CM | POA: Diagnosis not present

## 2020-12-04 DIAGNOSIS — I2510 Atherosclerotic heart disease of native coronary artery without angina pectoris: Secondary | ICD-10-CM

## 2020-12-04 DIAGNOSIS — R9431 Abnormal electrocardiogram [ECG] [EKG]: Secondary | ICD-10-CM | POA: Insufficient documentation

## 2020-12-04 DIAGNOSIS — E785 Hyperlipidemia, unspecified: Secondary | ICD-10-CM | POA: Insufficient documentation

## 2020-12-04 DIAGNOSIS — I43 Cardiomyopathy in diseases classified elsewhere: Secondary | ICD-10-CM

## 2020-12-04 DIAGNOSIS — E854 Organ-limited amyloidosis: Secondary | ICD-10-CM | POA: Diagnosis not present

## 2020-12-04 DIAGNOSIS — R202 Paresthesia of skin: Secondary | ICD-10-CM | POA: Insufficient documentation

## 2020-12-04 LAB — BASIC METABOLIC PANEL
Anion gap: 7 (ref 5–15)
BUN: 47 mg/dL — ABNORMAL HIGH (ref 8–23)
CO2: 22 mmol/L (ref 22–32)
Calcium: 8.6 mg/dL — ABNORMAL LOW (ref 8.9–10.3)
Chloride: 109 mmol/L (ref 98–111)
Creatinine, Ser: 1.54 mg/dL — ABNORMAL HIGH (ref 0.44–1.00)
GFR, Estimated: 35 mL/min — ABNORMAL LOW (ref >60)
Glucose, Bld: 81 mg/dL (ref 70–99)
Potassium: 4.6 mmol/L (ref 3.5–5.1)
Sodium: 138 mmol/L (ref 135–145)

## 2020-12-04 LAB — BRAIN NATRIURETIC PEPTIDE: B Natriuretic Peptide: 476 pg/mL — ABNORMAL HIGH (ref 0.0–100.0)

## 2020-12-04 NOTE — Progress Notes (Signed)
PCP: Idamae Schuller, MD Cardiology: Dr. Burt Knack HF Cardiology: Dr. Aundra Dubin  75 y.o. with history of aortic stenosis, chronic diastolic CHF, and CAD was referred by Dr. Burt Knack for evaluation of CHF.  Based on 12/21 echo and RHC/LHC in 12/21, the patient has moderate aortic stenosis. She had coronary angiography in 4/15 with rather diffuse moderate-severe coronary disease that was managed medically.  Cardiac MRI in 11/19 was concerning for cardiac amyloidosis. However, PYP scan was negative in 11/19.  Workup at that time also did not show a monoclonal protein to suggest AL amyloidosis.    Patient is very limited at baseline.  She has severe arthritis and is wheelchair-bound.  She is able to do chores around the house from her wheelchair without exertional dyspnea.  No chest pain.  No dyspnea with transfers to bed/chair.  No orthopnea/PND.  Rare palpitations.  No lightheadedness.  She is unable to stand even with a walker.   ECG (personally reviewed): sinus brady with 1st degree AVB (328 msec), LVH with repolarization abnormality.   Labs (9/22): LDL 86, K 4.9, creatinine 1.26.   PMH: 1. CKD stage 3 2. H/o seizure disorder 3. H/o TIA 4. H/o Mobitz type 1 2nd degree AVB 5. HTN 6. CAD: LHC (4/15) with 80% and 50% tandem stenoses in the proximal LAD, 80% mid LCx, 85% dLCx, diffuse mid-distal disease up to 90% in RCA.  Diffuse coronary disease, treated medically.  7. Aortic stenosis: Suspect moderate AS. - Echo (12/21): AoV mean gradient 32, AVA 1.2 cm^2.  - RHC/LHC (12/21): AoV mean gradient 22, AVA 1.47 cm^2.  8. Chronic diastolic CHF:  - Cardiac MRI (11/19): EF 76%, moderate LVH, difficult to null LV myocardium with diffuse subendocardial LGE concerning for possible cardiac amyloidosis, moderate pericardial effusion.  - PYP scan (11/19): grade 0, H/CL 1.08.  - Echo (12/21): EF 60-65%, moderate LVH, normal RV, moderate pericardial effusion, moderate AS with AoV mean gradient 32, AVA 1.2 cm^2.  9.  Arthritis: Severe, limited to wheelchair.  10. Hyperlipidemia  Social History   Socioeconomic History   Marital status: Married    Spouse name: Not on file   Number of children: 2   Years of education: 4   Highest education level: Not on file  Occupational History   Occupation: retired  Tobacco Use   Smoking status: Former    Types: Cigarettes    Quit date: 06/03/2000    Years since quitting: 20.5   Smokeless tobacco: Never  Vaping Use   Vaping Use: Never used  Substance and Sexual Activity   Alcohol use: No    Alcohol/week: 0.0 standard drinks   Drug use: No   Sexual activity: Not Currently    Partners: Male  Other Topics Concern   Not on file  Social History Narrative   Current Social History 06/20/2020        Patient lives with spouse in a home which is 1 story. There are not steps up to the entrance, the patient uses a ramp.       Patient's method of transportation is [employer].      The highest level of education was some high school.      The patient currently disabled.      Identified important Relationships are "God and my husband"       Pets : None       Interests / Fun: "Sitting on my front porch"       Current Stressors: None  Social Determinants of Health   Financial Resource Strain: Not on file  Food Insecurity: Not on file  Transportation Needs: Not on file  Physical Activity: Not on file  Stress: Not on file  Social Connections: Not on file  Intimate Partner Violence: Not on file   Family History  Problem Relation Age of Onset   Hypertension Other    Heart disease Mother    Hypertension Mother    Cancer Father    Obesity Son    Asthma Sister    Heart disease Sister    Heart disease Sister    ROS: All systems reviewed and negative except as per HPI.   Current Outpatient Medications  Medication Sig Dispense Refill   aspirin 81 MG EC tablet Take 81 mg by mouth daily.     Calcium Carb-Cholecalciferol (CALCIUM 500 +D) 500-400  MG-UNIT TABS Take 1 tablet by mouth 2 (two) times daily. 60 tablet 2   furosemide (LASIX) 40 MG tablet Take 1 tablet (40 mg total) by mouth daily. 90 tablet 3   lidocaine (LIDODERM) 5 % Place 1 patch onto the skin daily. Remove & Discard patch within 12 hours or as directed by MD 30 patch 0   lisinopril (ZESTRIL) 40 MG tablet Take 1 tablet (40 mg total) by mouth daily. 90 tablet 3   mupirocin ointment (BACTROBAN) 2 % Place 1 application into the nose 2 (two) times daily. 22 g 0   pantoprazole (PROTONIX) 40 MG tablet Take 1 tablet (40 mg total) by mouth daily. 90 tablet 3   PHENobarbital (LUMINAL) 64.8 MG tablet Take 2 tablets (129.6 mg total) by mouth at bedtime. 180 tablet 3   potassium chloride (KLOR-CON) 10 MEQ tablet Take 1 tablet (10 mEq total) by mouth daily. 90 tablet 3   rosuvastatin (CRESTOR) 20 MG tablet Take 1 tablet (20 mg total) by mouth daily. 90 tablet 3   zonisamide (ZONEGRAN) 100 MG capsule Take 2 capsules every night 180 capsule 3   No current facility-administered medications for this encounter.   BP 128/60   Pulse (!) 48   Wt 64.1 kg (141 lb 6.4 oz)   SpO2 99%   BMI 26.72 kg/m  General: NAD Neck: JVP 8 cm, no thyromegaly or thyroid nodule.  Lungs: Clear to auscultation bilaterally with normal respiratory effort. CV: Nondisplaced PMI.  Heart regular S1/S2, no [id number], 3/6 systolic crescendo/decrescendo murmur RUSB with mildly obscured S2.  No peripheral edema.  No carotid bruit.  Normal pedal pulses.  Abdomen: Soft, nontender, no hepatosplenomegaly, no distention.  Skin: Intact without lesions or rashes.  Neurologic: Alert and oriented x 3.  Psych: Normal affect. Extremities: No clubbing or cyanosis.  HEENT: Normal.   1. Aortic stenosis: 12/21 LHC/RHC and echo were suggestive of moderate aortic stenosis. She has some muffling of S2 on exam.  - Repeat echo to reassess aortic stenosis.  - Consider TAVR with severe AS by echo.  She is not particularly symptomatic but is  also minimally active due to arthritis and inability to stand/walk.  2. CAD: Moderate-severe diffuse CAD on coronary angiography in 4/15.  She does not have anginal symptoms but is also minimally active.  She would not be a candidate for CABG due to lack of mobility.  - Medically manage given no chest pain.  - Continue ASA 81.  - Continue Crestor, recently increased.  3. CKD stage 3: BMET today. 4. Chronic diastolic CHF: Cardiac MRI (see above) was concerning for cardiac amyloidosis (11/19), as was  echo with moderate LVH and aortic stenosis. The aortic stenosis and the pericardial effusion seen by echo are also frequently seen with cardiac amyloidosis. She additionally has tingling/numbness in hands/feet that is consistent with peripheral neuropathy.  However, PYP scan in 11/19 was negative and myeloma workup was negative.  She is not significantly volume overloaded on exam today.  Very minimally active, so hard to define symptoms (none at rest).   - Continue Lasix 40 mg daily, check BMET/BNP today.  - Repeating echo as above.  - Consider SGLT2 inhibitor at next appt.  - I would repeat cardiac amyloidosis workup as imaging studies are certainly concerning (and prior studies done back in 2019).  I will arrange for repeat PYP scan and will send myeloma panel, serum free light chains, urine immunofixation.  I will also send for [facility] gene testing to search for hATTR mutations.  5. HTN: BP controlled.  6. Conduction abnormality: Long 1st degree AVB 328 msec.   - Avoid beta blockade.  7. Hyperlipidemia: Crestor recently increased, repeat lipids/LFTs in another month.   Loralie Champagne 12/05/2020

## 2020-12-04 NOTE — Patient Instructions (Addendum)
EKG done today.  Labs done today. We will contact you only if your labs are abnormal.  No medication changes were made. Please continue all current medications as prescribed.  Genetic test has been done, this has to be sent to [place] to be processed and can take 1-2 weeks to get results back.  We will let you know the results.   Your provider has requested that you have a PYP scan done. This test has to be approved through your insurance company prior to scheduling, once approved we will contact you to schedule an appointment.   Your physician recommends that you schedule a follow-up appointment soon for an echo and in 1 month with Dr. Aundra Dubin.  Your physician has requested that you have an echocardiogram. Echocardiography is a painless test that uses sound waves to create images of your heart. It provides your doctor with information about the size and shape of your heart and how well your heart's chambers and valves are working. This procedure takes approximately one hour. There are no restrictions for this procedure.  If you have any questions or concerns before your next appointment please send [place] a message through [place] or call our office at [phone].    TO LEAVE A MESSAGE FOR THE NURSE SELECT OPTION 2, PLEASE LEAVE A MESSAGE INCLUDING: YOUR NAME DATE OF BIRTH CALL BACK NUMBER REASON FOR CALL**this is important as we prioritize the call backs  YOU WILL RECEIVE A CALL BACK THE SAME DAY AS LONG AS YOU CALL BEFORE 4:00 PM   Do the following things EVERYDAY: Weigh yourself in the morning before breakfast. Write it down and keep it in a log. Take your medicines as prescribed Eat low salt foods--Limit salt (sodium) to 2000 mg per day.  Stay as active as you can everyday Limit all fluids for the day to less than 2 liters   At the [facility], you and your health needs are our priority. As part of our continuing mission to provide you with exceptional heart  care, we have created designated Provider Care Teams. These Care Teams include your primary Cardiologist (physician) and Advanced Practice Providers (APPs- Physician Assistants and Nurse Practitioners) who all work together to provide you with the care you need, when you need it.   You may see any of the following providers on your designated Care Team at your next follow up: Dr Glori Bickers Dr Haynes Kerns, NP Lyda Jester, [place] Audry Riles, PharmD   Please be sure to bring in all your medications bottles to every appointment.

## 2020-12-05 LAB — IMMUNOFIXATION, URINE

## 2020-12-06 LAB — PE (RFX IFE+FLC), S
A/G Ratio: 1.1 (ref 0.7–1.7)
Albumin ELP: 3.7 g/dL (ref 2.9–4.4)
Alpha-1-Globulin: 0.2 g/dL (ref 0.0–0.4)
Alpha-2-Globulin: 0.7 g/dL (ref 0.4–1.0)
Beta Globulin: 0.9 g/dL (ref 0.7–1.3)
Gamma Globulin: 1.5 g/dL (ref 0.4–1.8)
Globulin, Total: 3.3 g/dL (ref 2.2–3.9)
Total Protein ELP: 7 g/dL (ref 6.0–8.5)

## 2020-12-06 LAB — MULTIPLE MYELOMA PANEL, SERUM
Albumin SerPl Elph-Mcnc: 3.5 g/dL (ref 2.9–4.4)
Albumin/Glob SerPl: 1.1 (ref 0.7–1.7)
Alpha 1: 0.2 g/dL (ref 0.0–0.4)
Alpha2 Glob SerPl Elph-Mcnc: 0.7 g/dL (ref 0.4–1.0)
B-Globulin SerPl Elph-Mcnc: 0.9 g/dL (ref 0.7–1.3)
Gamma Glob SerPl Elph-Mcnc: 1.5 g/dL (ref 0.4–1.8)
Globulin, Total: 3.3 g/dL (ref 2.2–3.9)
IgA: 199 mg/dL (ref 64–422)
IgG (Immunoglobin G), Serum: 1496 mg/dL (ref 586–1602)
IgM (Immunoglobulin M), Srm: 61 mg/dL (ref 26–217)
Total Protein ELP: 6.8 g/dL (ref 6.0–8.5)

## 2020-12-20 ENCOUNTER — Telehealth (HOSPITAL_COMMUNITY): Payer: Self-pay | Admitting: *Deleted

## 2020-12-27 ENCOUNTER — Encounter (HOSPITAL_COMMUNITY)
Admission: RE | Admit: 2020-12-27 | Discharge: 2020-12-27 | Disposition: A | Discharge status: Home / Self Care | Payer: Medicare Other | Source: Ambulatory Visit | Attending: Cardiology | Admitting: Cardiology

## 2020-12-27 ENCOUNTER — Other Ambulatory Visit: Payer: Self-pay

## 2020-12-27 DIAGNOSIS — I43 Cardiomyopathy in diseases classified elsewhere: Secondary | ICD-10-CM | POA: Insufficient documentation

## 2020-12-27 DIAGNOSIS — E854 Organ-limited amyloidosis: Secondary | ICD-10-CM | POA: Insufficient documentation

## 2020-12-27 MED ORDER — TECHNETIUM TC 99M PYROPHOSPHATE
21.4000 | Freq: Once | INTRAVENOUS | Status: AC | PRN
  Administered 2020-12-27: 21.4 via INTRAVENOUS
  Filled 2020-12-27: qty 22

## 2020-12-28 ENCOUNTER — Ambulatory Visit (HOSPITAL_COMMUNITY)
Admission: RE | Admit: 2020-12-28 | Discharge: 2020-12-28 | Disposition: A | Discharge status: Home / Self Care | Payer: Medicare Other | Source: Ambulatory Visit | Attending: Cardiology | Admitting: Cardiology

## 2020-12-28 DIAGNOSIS — I3139 Other pericardial effusion (noninflammatory): Secondary | ICD-10-CM | POA: Diagnosis not present

## 2020-12-28 DIAGNOSIS — I080 Rheumatic disorders of both mitral and aortic valves: Secondary | ICD-10-CM | POA: Insufficient documentation

## 2020-12-28 DIAGNOSIS — I119 Hypertensive heart disease without heart failure: Secondary | ICD-10-CM | POA: Diagnosis not present

## 2020-12-28 DIAGNOSIS — I350 Nonrheumatic aortic (valve) stenosis: Secondary | ICD-10-CM

## 2020-12-28 LAB — ECHOCARDIOGRAM COMPLETE
AR max vel: 0.88 cm2
AV Area VTI: 0.88 cm2
AV Area mean vel: 0.85 cm2
AV Mean grad: 23.5 mmHg
AV Peak grad: 44.2 mmHg
Ao pk vel: 3.32 m/s
MV VTI: 1.04 cm2
S' Lateral: 2.9 cm

## 2020-12-28 NOTE — Progress Notes (Signed)
  Echocardiogram 2D Echocardiogram has been performed.  Sherry [name] 12/28/2020, 9:10 AM

## 2021-01-14 ENCOUNTER — Other Ambulatory Visit: Payer: Self-pay | Admitting: Internal Medicine

## 2021-01-14 DIAGNOSIS — E782 Mixed hyperlipidemia: Secondary | ICD-10-CM

## 2021-01-15 ENCOUNTER — Ambulatory Visit (HOSPITAL_COMMUNITY)
Admission: RE | Admit: 2021-01-15 | Discharge: 2021-01-15 | Disposition: A | Discharge status: Home / Self Care | Payer: Medicare Other | Source: Ambulatory Visit | Attending: Cardiology | Admitting: Cardiology

## 2021-01-15 ENCOUNTER — Encounter (HOSPITAL_COMMUNITY): Payer: Self-pay | Admitting: Cardiology

## 2021-01-15 ENCOUNTER — Other Ambulatory Visit (HOSPITAL_COMMUNITY): Payer: Self-pay

## 2021-01-15 ENCOUNTER — Other Ambulatory Visit: Payer: Self-pay

## 2021-01-15 VITALS — BP 160/70 | HR 65 | Wt 143.0 lb

## 2021-01-15 DIAGNOSIS — Z993 Dependence on wheelchair: Secondary | ICD-10-CM | POA: Insufficient documentation

## 2021-01-15 DIAGNOSIS — I130 Hypertensive heart and chronic kidney disease with heart failure and stage 1 through stage 4 chronic kidney disease, or unspecified chronic kidney disease: Secondary | ICD-10-CM | POA: Diagnosis not present

## 2021-01-15 DIAGNOSIS — E785 Hyperlipidemia, unspecified: Secondary | ICD-10-CM | POA: Insufficient documentation

## 2021-01-15 DIAGNOSIS — I3139 Other pericardial effusion (noninflammatory): Secondary | ICD-10-CM | POA: Insufficient documentation

## 2021-01-15 DIAGNOSIS — N183 Chronic kidney disease, stage 3 unspecified: Secondary | ICD-10-CM | POA: Insufficient documentation

## 2021-01-15 DIAGNOSIS — Z7984 Long term (current) use of oral hypoglycemic drugs: Secondary | ICD-10-CM | POA: Diagnosis not present

## 2021-01-15 DIAGNOSIS — I5032 Chronic diastolic (congestive) heart failure: Secondary | ICD-10-CM | POA: Insufficient documentation

## 2021-01-15 DIAGNOSIS — Z7982 Long term (current) use of aspirin: Secondary | ICD-10-CM | POA: Diagnosis not present

## 2021-01-15 DIAGNOSIS — M1990 Unspecified osteoarthritis, unspecified site: Secondary | ICD-10-CM | POA: Diagnosis not present

## 2021-01-15 DIAGNOSIS — I350 Nonrheumatic aortic (valve) stenosis: Secondary | ICD-10-CM | POA: Diagnosis not present

## 2021-01-15 DIAGNOSIS — I2510 Atherosclerotic heart disease of native coronary artery without angina pectoris: Secondary | ICD-10-CM | POA: Diagnosis not present

## 2021-01-15 DIAGNOSIS — Z87891 Personal history of nicotine dependence: Secondary | ICD-10-CM | POA: Diagnosis not present

## 2021-01-15 LAB — BASIC METABOLIC PANEL
Anion gap: 8 (ref 5–15)
BUN: 47 mg/dL — ABNORMAL HIGH (ref 8–23)
CO2: 22 mmol/L (ref 22–32)
Calcium: 8.8 mg/dL — ABNORMAL LOW (ref 8.9–10.3)
Chloride: 107 mmol/L (ref 98–111)
Creatinine, Ser: 1.48 mg/dL — ABNORMAL HIGH (ref 0.44–1.00)
GFR, Estimated: 37 mL/min — ABNORMAL LOW (ref >60)
Glucose, Bld: 80 mg/dL (ref 70–99)
Potassium: 4.6 mmol/L (ref 3.5–5.1)
Sodium: 137 mmol/L (ref 135–145)

## 2021-01-15 LAB — BRAIN NATRIURETIC PEPTIDE: B Natriuretic Peptide: 463.1 pg/mL — ABNORMAL HIGH (ref 0.0–100.0)

## 2021-01-15 MED ORDER — EMPAGLIFLOZIN 10 MG PO TABS: 10.0000 mg | ORAL_TABLET | Freq: Every day | ORAL | 3 refills | Status: DC

## 2021-01-15 MED ORDER — AMLODIPINE BESYLATE 5 MG PO TABS: 5.0000 mg | ORAL_TABLET | Freq: Every day | ORAL | 3 refills | Status: DC

## 2021-01-15 NOTE — Patient Instructions (Addendum)
START Amlodipine 5mg  (1 tab) daily  START Jardiance 10mg  (1 tab) daily  Labs today We will only contact you if something comes back abnormal or we need to make some changes. Otherwise no news is good news!  Your physician recommends that you schedule a follow-up appointment in: 2 months with Nurse Practitioner/Physician Assistant  Please call office at [phone] option [phone] if you have any questions or concerns.   Do the following things EVERYDAY: Weigh yourself in the morning before breakfast. Write it down and keep it in a log. Take your medicines as prescribed Eat low salt foods--Limit salt (sodium) to 2000 mg per day.  Stay as active as you can everyday Limit all fluids for the day to less than 2 liters   At the [facility], you and your health needs are our priority. As part of our continuing mission to provide you with exceptional heart care, we have created designated Provider Care Teams. These Care Teams include your primary Cardiologist (physician) and Advanced Practice Providers (APPs- Physician Assistants and Nurse Practitioners) who all work together to provide you with the care you need, when you need it.   You may see any of the following providers on your designated Care Team at your next follow up: Dr Glori Bickers Dr Haynes Kerns, NP Lyda Jester, [place] [facility] [place], [place] Audry Riles, PharmD   Please be sure to bring in all your medications bottles to every appointment.

## 2021-01-15 NOTE — Progress Notes (Signed)
PCP: Idamae Schuller, MD Cardiology: Dr. Burt Knack HF Cardiology: Dr. Aundra Dubin  75 y.o. with history of aortic stenosis, chronic diastolic CHF, and CAD was referred by Dr. Burt Knack for evaluation of CHF.  Based on 12/21 echo and RHC/LHC in 12/21, the patient has moderate aortic stenosis. She had coronary angiography in 4/15 with rather diffuse moderate-severe coronary disease that was managed medically.  Cardiac MRI in 11/19 was concerning for cardiac amyloidosis. However, PYP scan was negative in 11/19.  Workup at that time also did not show a monoclonal protein to suggest AL amyloidosis.    Echo in 11/22 showed EF 60-65%, severe LVH, normal RV size and systolic function, mild MR, paradoxical low flow/low gradient severe aortic stenosis with mean gradient 24 mmHg and AVA 0.88 cm^2, small to moderate pericardial effusion.  PYP scan was repeated in 11/22, grade 1 with H/CL 1.07.  This is unlikely to be TTR cardiac amyloidosis.  Repeat urine immunofixation and myeloma panel were negative as well.  [employer] gene testing negative for hATTR mutation.   Patient remains very limited at baseline.  She has severe arthritis and is wheelchair-bound.  She is able to do chores around the house from her wheelchair without exertional dyspnea.  She is able to stand and pivot.  Knees give way if she tries to walk.  No chest pain.  No orthopnea/PND.  Rare palpitations.  No lightheadedness.  BP is elevated today. Weight is up 2 lbs.   Labs (9/22): LDL 86, K 4.9, creatinine 1.26. Labs (10/22): urine immunofixation negative, myeloma panel negative, BNP 476, K 4.6, creatinine 1.54   PMH: 1. CKD stage 3 2. H/o seizure disorder 3. H/o TIA 4. H/o Mobitz type 1 2nd degree AVB 5. HTN 6. CAD: LHC (4/15) with 80% and 50% tandem stenoses in the proximal LAD, 80% mid LCx, 85% dLCx, diffuse mid-distal disease up to 90% in RCA.  Diffuse coronary disease, treated medically.  7. Aortic stenosis: Suspect moderate AS. - Echo (12/21): AoV mean  gradient 32, AVA 1.2 cm^2.  - RHC/LHC (12/21): AoV mean gradient 22, AVA 1.47 cm^2.  - Echo (11/22): EF 60-65%, severe LVH, normal RV size and systolic function, mild MR, paradoxical low flow/low gradient severe aortic stenosis with mean gradient 24 mmHg and AVA 0.88 cm^2, small to moderate pericardial effusion.   8. Chronic diastolic CHF:  - Cardiac MRI (11/19): EF 76%, moderate LVH, difficult to null LV myocardium with diffuse subendocardial LGE concerning for possible cardiac amyloidosis, moderate pericardial effusion.  - PYP scan (11/19): grade 0, H/CL 1.08.  - Echo (12/21): EF 60-65%, moderate LVH, normal RV, moderate pericardial effusion, moderate AS with AoV mean gradient 32, AVA 1.2 cm^2.  - Echo (11/22): EF 60-65%, severe LVH, normal RV size and systolic function, mild MR, paradoxical low flow/low gradient severe aortic stenosis with mean gradient 24 mmHg and AVA 0.88 cm^2, small to moderate pericardial effusion.   - PYP scan (11/22): grade 1 with H/CL 1.07.  This is unlikely to be TTR cardiac amyloidosis. - [employer] gene testing negative for hATTR mutation.  9. Arthritis: Severe, limited to wheelchair.  10. Hyperlipidemia  Social History   Socioeconomic History   Marital status: Married    Spouse name: Not on file   Number of children: 2   Years of education: 4   Highest education level: Not on file  Occupational History   Occupation: retired  Tobacco Use   Smoking status: Former    Types: Cigarettes    Quit date:  06/03/2000    Years since quitting: 20.6   Smokeless tobacco: Never  Vaping Use   Vaping Use: Never used  Substance and Sexual Activity   Alcohol use: No    Alcohol/week: 0.0 standard drinks   Drug use: No   Sexual activity: Not Currently    Partners: Male  Other Topics Concern   Not on file  Social History Narrative   Current Social History 06/20/2020        Patient lives with spouse in a home which is 1 story. There are not steps up to the entrance, the  patient uses a ramp.       Patient's method of transportation is [employer].      The highest level of education was some high school.      The patient currently disabled.      Identified important Relationships are "God and my husband"       Pets : None       Interests / Fun: "Sitting on my front porch"       Current Stressors: None              Social Determinants of Radio broadcast assistant Strain: Not on file  Food Insecurity: Not on file  Transportation Needs: Not on file  Physical Activity: Not on file  Stress: Not on file  Social Connections: Not on file  Intimate Partner Violence: Not on file   Family History  Problem Relation Age of Onset   Hypertension Other    Heart disease Mother    Hypertension Mother    Cancer Father    Obesity Son    Asthma Sister    Heart disease Sister    Heart disease Sister    ROS: All systems reviewed and negative except as per HPI.   Current Outpatient Medications  Medication Sig Dispense Refill   amLODipine (NORVASC) 5 MG tablet Take 1 tablet (5 mg total) by mouth daily. 180 tablet 3   aspirin 81 MG EC tablet Take 81 mg by mouth daily.     Calcium Carb-Cholecalciferol (CALCIUM 500 +D) 500-400 MG-UNIT TABS Take 1 tablet by mouth 2 (two) times daily. 60 tablet 2   empagliflozin (JARDIANCE) 10 MG TABS tablet Take 1 tablet (10 mg total) by mouth daily before breakfast. 90 tablet 3   furosemide (LASIX) 40 MG tablet Take 1 tablet (40 mg total) by mouth daily. 90 tablet 3   lidocaine (LIDODERM) 5 % Place 1 patch onto the skin daily. Remove & Discard patch within 12 hours or as directed by MD 30 patch 0   lisinopril (ZESTRIL) 40 MG tablet Take 1 tablet (40 mg total) by mouth daily. 90 tablet 3   mupirocin ointment (BACTROBAN) 2 % Place 1 application into the nose 2 (two) times daily. 22 g 0   pantoprazole (PROTONIX) 40 MG tablet Take 1 tablet (40 mg total) by mouth daily. 90 tablet 3   PHENobarbital (LUMINAL) 64.8 MG tablet Take 2  tablets (129.6 mg total) by mouth at bedtime. 180 tablet 3   potassium chloride (KLOR-CON) 10 MEQ tablet Take 1 tablet (10 mEq total) by mouth daily. 90 tablet 3   rosuvastatin (CRESTOR) 20 MG tablet Take 1 tablet (20 mg total) by mouth daily. 90 tablet 3   zonisamide (ZONEGRAN) 100 MG capsule Take 2 capsules every night 180 capsule 3   No current facility-administered medications for this encounter.   BP (!) 160/70   Pulse 65  Wt 64.9 kg (143 lb)   SpO2 98%   BMI 27.02 kg/m  General: NAD Neck: No JVD, no thyromegaly or thyroid nodule.  Lungs: Clear to auscultation bilaterally with normal respiratory effort. CV: Nondisplaced PMI.  Heart regular S1/S2, no S3/S4, 3/6 SEM RUSB with obscured S2.  1+ ankle edema.  No carotid bruit.  Normal pedal pulses.  Abdomen: Soft, nontender, no hepatosplenomegaly, no distention.  Skin: Intact without lesions or rashes.  Neurologic: Alert and oriented x 3.  Psych: Normal affect. Extremities: No clubbing or cyanosis.  HEENT: Normal.   1. Aortic stenosis: 12/21 LHC/RHC and echo were suggestive of moderate aortic stenosis. She has muffling of S2 on exam.  Repeat echo in 11/22 showed paradoxical low flow/low gradient severe aortic stenosis.   - I will send her back to Dr. Burt Knack for consideration of TAVR with severe AS by echo.  She is not particularly symptomatic but is also minimally active due to arthritis and inability to walk.  She does remain active from her wheelchair.  She will need coronary assessment if TAVR is decided upon.  2. CAD: Moderate-severe diffuse CAD on coronary angiography in 4/15.  She does not have anginal symptoms but is also minimally active.  She would not be a candidate for CABG due to lack of mobility.  - Medically managed given no chest pain.  - Continue ASA 81.  - Continue Crestor, recently increased.  - Will need coronary angiography prior to TAVR.  3. CKD stage 3: BMET today. 4. Chronic diastolic CHF: Cardiac MRI (see  above) was concerning for cardiac amyloidosis (11/19), as was echo with moderate LVH and aortic stenosis. The aortic stenosis and the pericardial effusion seen by echo are also frequently seen with cardiac amyloidosis. She additionally has tingling/numbness in hands/feet that is consistent with peripheral neuropathy.  However, PYP scan in 11/19 was negative and myeloma workup was negative.  Repeat PYP scan in 11/22 was again negative and she did not have a transthyretin gene mutation on [employer] gene testing.  Myeloma workup was again negative in 10/22.  Therefore, cardiac amyloidosis seems unlikely and LVH is more likely to be due to HTN and aortic stenosis.  She is not significantly volume overloaded on exam today.  Very minimally active, so hard to define symptoms (none at rest).   - Continue Lasix 40 mg daily, check BMET/BNP today.  - Start Jardiance 10 mg daily (no recent UTI or yeast infection), check BMET in 10 days.  5. HTN: BP elevated.  - Add amlodipine 5 mg daily.  6. Conduction abnormality: Long 1st degree AVB 328 msec.   - Avoid beta blockade.  7. Hyperlipidemia: Crestor recently increased, repeat lipids next appt.   Refer to Dr. Burt Knack to assess for TAVR, followup APP 2 months.   Loralie Champagne 01/15/2021

## 2021-01-18 ENCOUNTER — Other Ambulatory Visit (HOSPITAL_COMMUNITY): Payer: Self-pay | Admitting: Cardiology

## 2021-01-22 ENCOUNTER — Encounter: Payer: Self-pay | Admitting: Cardiovascular Disease

## 2021-01-22 ENCOUNTER — Other Ambulatory Visit: Payer: Self-pay

## 2021-01-22 ENCOUNTER — Ambulatory Visit (INDEPENDENT_AMBULATORY_CARE_PROVIDER_SITE_OTHER): Payer: Medicare Other | Admitting: Cardiovascular Disease

## 2021-01-22 VITALS — BP 120/80 | HR 56 | Ht 61.0 in | Wt 143.0 lb

## 2021-01-22 DIAGNOSIS — N1831 Chronic kidney disease, stage 3a: Secondary | ICD-10-CM

## 2021-01-22 DIAGNOSIS — Z01818 Encounter for other preprocedural examination: Secondary | ICD-10-CM

## 2021-01-22 DIAGNOSIS — I350 Nonrheumatic aortic (valve) stenosis: Secondary | ICD-10-CM

## 2021-01-22 DIAGNOSIS — N289 Disorder of kidney and ureter, unspecified: Secondary | ICD-10-CM

## 2021-01-22 DIAGNOSIS — Z01810 Encounter for preprocedural cardiovascular examination: Secondary | ICD-10-CM

## 2021-01-22 DIAGNOSIS — I5032 Chronic diastolic (congestive) heart failure: Secondary | ICD-10-CM | POA: Diagnosis not present

## 2021-01-22 NOTE — Progress Notes (Signed)
Cardiology Office Note:    Date:  01/22/2021   ID:  Sherry [name], Sherry [name] Feb 23, 1945, MRN [id number]  PCP:  Idamae Schuller, MD   [place] Providers Cardiologist:  Sherren Mocha, MD     Referring MD: Idamae Schuller, MD   Chief Complaint  Patient presents with   Aortic Stenosis    History of Present Illness:    Sherry [name] is a 75 y.o. female referred for evaluation of aortic stenosis by Dr. Aundra Dubin.  The patient is well-known to me.  I have followed her for many years.  She has a history of hypertension, severe LVH, conduction disease with remote history of 2: 1 AV block, and aortic stenosis.  She underwent TAVR evaluation in 2015.  She was found to have severe three-vessel coronary artery disease at that time.  She underwent formal cardiac surgical evaluation and actually saw Dr. Roxy Manns in the clinic many times over the last 6 years.  Because of her lack of symptoms, poor functional status essentially wheelchair-bound, and comorbid conditions, she has been followed conservatively without intervention.  The patient presents today for reevaluation for consideration of TAVR.  She has been found to have progressive and severe paradoxical low-flow low gradient aortic stenosis.  The patient is here with her husband today.  She continues to deny cardiac symptoms.  She specifically denies chest pain, shortness of breath, edema, orthopnea, or PND.  She has had no recent lightheadedness or syncope.  She remains functionally very limited due to advanced arthritis and leg weakness.  The patient is in a wheelchair most of the day, but she is able to stand up as long as she has something to hold onto.  She is able to do her household chores.  She reports no other interval complaints.    Past Medical History:  Diagnosis Date   AKI (acute kidney injury) ([place]) 01/31/2020   Anemia    Aortic stenosis    Arthritis    Bilateral lower extremity edema    Bilateral lower extremity edema     Blurry vision, bilateral 05/08/2016   Bradycardia    a. 01/2013 - asymptomatic.   Chronic diastolic heart failure (HCC)    CKD (chronic kidney disease)    a. baseline CKD stage III   Coronary artery disease    GERD (gastroesophageal reflux disease)    Headache(784.0)    Heart block AV second degree 02/15/2013   Heart murmur    Hyperlipidemia    Hypertension    Hypotension    Hypovolemic shock ([place])    Internal and external hemorrhoids without complication 49/05/4965   Colonoscopy in 2008 recs repeat in 5 years   Intractable focal epilepsy with impairment of consciousness ([place]) 01/02/2012   Mobitz (type) I (Wenckebach's) atrioventricular block    a. 01/2013 - asymptomatic.   Morbid obesity ([place])    Seizure disorder ([place])    Seizures ([place])    TIA (transient ischemic attack) 2014   pt stated she had "mini strokes"   Vitamin D deficiency 04/22/2014    Past Surgical History:  Procedure Laterality Date   ABDOMINAL HYSTERECTOMY     LEFT AND RIGHT HEART CATHETERIZATION WITH CORONARY ANGIOGRAM N/A 05/24/2013   Procedure: LEFT AND RIGHT HEART CATHETERIZATION WITH CORONARY ANGIOGRAM;  Surgeon: Sinclair Grooms, MD;  Location: [facility] CATH LAB;  Service: Cardiovascular;  Laterality: N/A;   LEFT HEART CATH N/A 02/01/2020   Procedure: Left Heart Cath;  Surgeon: Jolaine Artist, MD;  Location: [facility]  INVASIVE CV LAB;  Service: Cardiovascular;  Laterality: N/A;   RIGHT HEART CATH N/A 02/01/2020   Procedure: RIGHT HEART CATH;  Surgeon: Jolaine Artist, MD;  Location: [facility] CV LAB;  Service: Cardiovascular;  Laterality: N/A;    Current Medications: Current Meds  Medication Sig   alendronate (FOSAMAX) 70 MG tablet Take 70 mg by mouth once a week. Take with a full glass of water on an empty stomach. Taking 1/2 tablet once weekly   amLODipine (NORVASC) 5 MG tablet Take 1 tablet (5 mg total) by mouth daily.   aspirin 81 MG EC tablet Take 81 mg by mouth daily.   Calcium Carb-Cholecalciferol  (CALCIUM 500 +D) 500-400 MG-UNIT TABS Take 1 tablet by mouth 2 (two) times daily.   empagliflozin (JARDIANCE) 10 MG TABS tablet Take 1 tablet (10 mg total) by mouth daily before breakfast.   furosemide (LASIX) 40 MG tablet Take 1 tablet (40 mg total) by mouth daily.   lidocaine (LIDODERM) 5 % Place 1 patch onto the skin daily. Remove & Discard patch within 12 hours or as directed by MD   lisinopril (ZESTRIL) 40 MG tablet Take 1 tablet (40 mg total) by mouth daily.   mupirocin ointment (BACTROBAN) 2 % Place 1 application into the nose 2 (two) times daily.   pantoprazole (PROTONIX) 40 MG tablet Take 1 tablet (40 mg total) by mouth daily.   PHENobarbital (LUMINAL) 64.8 MG tablet Take 2 tablets (129.6 mg total) by mouth at bedtime.   potassium chloride (KLOR-CON) 10 MEQ tablet Take 1 tablet (10 mEq total) by mouth daily.   rosuvastatin (CRESTOR) 20 MG tablet Take 1 tablet (20 mg total) by mouth daily.   zonisamide (ZONEGRAN) 100 MG capsule Take 2 capsules every night     Allergies:   Ibuprofen   Social History   Socioeconomic History   Marital status: Married    Spouse name: Not on file   Number of children: 2   Years of education: 4   Highest education level: Not on file  Occupational History   Occupation: retired  Tobacco Use   Smoking status: Former    Types: Cigarettes    Quit date: 06/03/2000    Years since quitting: 20.6   Smokeless tobacco: Never  Vaping Use   Vaping Use: Never used  Substance and Sexual Activity   Alcohol use: No    Alcohol/week: 0.0 standard drinks   Drug use: No   Sexual activity: Not Currently    Partners: Male  Other Topics Concern   Not on file  Social History Narrative   Current Social History 06/20/2020        Patient lives with spouse in a home which is 1 story. There are not steps up to the entrance, the patient uses a ramp.       Patient's method of transportation is [employer].      The highest level of education was some high school.      The  patient currently disabled.      Identified important Relationships are "God and my husband"       Pets : None       Interests / Fun: "Sitting on my front porch"       Current Stressors: None              Social Determinants of Radio broadcast assistant Strain: Not on file  Food Insecurity: Not on file  Transportation Needs: Not on file  Physical  Activity: Not on file  Stress: Not on file  Social Connections: Not on file     Family History: The patient's family history includes Asthma in her sister; Cancer in her father; Heart disease in her mother, sister, and sister; Hypertension in her mother and another family member; Obesity in her son.  ROS:   Please see the history of present illness.    All other systems reviewed and are negative.  EKGs/Labs/Other Studies Reviewed:    The following studies were reviewed today: Cardiac Cath 2016: ANGIOGRAPHIC DATA:   The left main coronary artery is widely patent.   The left anterior descending artery has moderate to heavy proximal and mid calcification with tandem 80 and 50% stenoses noted after the large first septal perforator. The LAD wraps around the left ventricular apex..   The left circumflex artery is large, giving origin to 4 obtuse marginal branches. The second obtuse marginal is the dominant vessel and bifurcates on the mid lateral wall. The first obtuse marginal is small and contains ostial 70-80% narrowing. The mid circumflex contains an eccentric 80% stenosis within a heavily calcified segment. There is 30-50% narrowing in the ostium of the second obtuse marginal. The continuation of the circumflex beyond the second obtuse marginal contains segmental 85% stenosis before the origin of thE third and fourth obtuse marginalS.   The right coronary artery is dominant and contains severe segmental mid to distal disease with stenoses up to 90%.     LEFT VENTRICULOGRAM:  Left ventricular angiogram was done in the 30 RAO  projection and revealed normal to small LV cavity with hyperdynamic contractility and an estimated ejection fraction of 70-80%. This is artificially higher than felt due to post PVC increase in contractility.     IMPRESSIONS:  1. Moderate to severe three-vessel coronary disease as outlined above. The circumflex and right coronary are relatively diffusely diseased.   2. Aortic stenosis, with a surprisingly low transvalvular gradient based on direct hemodynamics. This would raise a question about the possibility of contamination of the aortic velocities on echo rom dynamic LV outflow obstruction.   3. Normal to hyperdynamic LV function.   4. Normal PA pressures.  Echo 12/28/2020: 1. Normal LV function; severe LVH, consider amyloid; calcified aortic  valve with probable severe AS (mean gradient 24 mmHg; AVA 0.9 cm2); small  to moderate pericardial effusion.   2. Left ventricular ejection fraction, by estimation, is 60 to 65%. The  left ventricle has normal function. The left ventricle has no regional  wall motion abnormalities. There is severe left ventricular hypertrophy.  Left ventricular diastolic parameters   are indeterminate. Elevated left atrial pressure.   3. Right ventricular systolic function is normal. The right ventricular  size is normal.   4. Left atrial size was moderately dilated.   5. A small pericardial effusion is present.   6. The mitral valve is normal in structure. Mild mitral valve  regurgitation. No evidence of mitral stenosis. Severe mitral annular  calcification.   7. The aortic valve is calcified. Aortic valve regurgitation is trivial.  Severe aortic valve stenosis.   8. The inferior vena cava is normal in size with greater than 50%  respiratory variability, suggesting right atrial pressure of 3 mmHg.  Cardiac MRI 2019: IMPRESSION: 1. This study is suggestive of cardiac amyloidosis. There is a moderate pericardial effusion (without tamponade),  moderate concentric LV hypertrophy, and on delayed enhancement imaging, the myocardium is difficult to null with a suggestion of diffuse subendocardial LGE.  2. There is aortic stenosis present, the degree would be better assessed by echo.  EKG:  EKG is not ordered today.    Recent Labs: 01/31/2020: TSH 4.577 02/02/2020: Magnesium 2.5 02/03/2020: Hemoglobin 10.0; Platelets 301 11/07/2020: ALT 15 01/15/2021: B Natriuretic Peptide 463.1; BUN 47; Creatinine, Ser 1.48; Potassium 4.6; Sodium 137  Recent Lipid Panel    Component Value Date/Time   CHOL 163 11/07/2020 1116   TRIG 100 11/07/2020 1116   HDL 59 11/07/2020 1116   CHOLHDL 2.8 11/07/2020 1116   CHOLHDL 4.1 01/27/2020 1032   VLDL 21 01/27/2020 1032   LDLCALC 86 11/07/2020 1116     Risk Assessment/Calculations:         STS Risk Calculator: Risk of Mortality:  2.811%  Renal Failure:  3.887%  Permanent Stroke:  2.668%  Prolonged Ventilation:  11.323%  DSW Infection:  0.156%  Reoperation:  3.411%  Morbidity or Mortality:  16.938%  Short Length of Stay:  19.952%  Long Length of Stay:  9.367%   Physical Exam:    VS:  BP 120/80   Pulse (!) 56   Ht 5\' 1"  (1.549 m)   Wt 143 lb (64.9 kg) Comment: per pt wheelchair, hard to stand  SpO2 99%   BMI 27.02 kg/m     Wt Readings from Last 3 Encounters:  01/22/21 143 lb (64.9 kg)  01/15/21 143 lb (64.9 kg)  12/04/20 141 lb 6.4 oz (64.1 kg)     GEN: Pleasant woman, in wheelchair, in no acute distress HEENT: Normal NECK: No JVD; No carotid bruits LYMPHATICS: No lymphadenopathy CARDIAC: Bradycardic and regular, 4/6 harsh late peaking systolic murmur with diminished A2 RESPIRATORY:  Clear to auscultation without rales, wheezing or rhonchi  ABDOMEN: Soft, non-tender, non-distended MUSCULOSKELETAL:  No edema; No deformity  SKIN: Warm and dry NEUROLOGIC:  Alert and oriented x 3 PSYCHIATRIC:  Normal affect   ASSESSMENT:    1. Nonrheumatic aortic valve stenosis    2. Chronic diastolic congestive heart failure (HCC)   3. Stage 3a chronic kidney disease ([place])   4. Pre-procedural cardiovascular examination    PLAN:    In order of problems listed above:  The patient likely has New York Heart Association functional class II symptoms of chronic diastolic heart failure with severe, paradoxical low-flow low gradient aortic stenosis (stage D3).  I have personally reviewed her echo images which demonstrate marked calcification and restriction of the aortic valve leaflets with reduced leaflet mobility.  LV systolic function is normal with severe LVH present.  There is a small pericardial effusion.  The mean transvalvular gradient is 23 mmHg, peak gradient 44 mmHg, dimensionless index 0.34, and calculated aortic valve area 0.85 cm.  We reviewed again the natural history of aortic stenosis today.  We discussed potential treatment options in light of her comorbid medical conditions.  We talked about 2 treatment options specifically today.  1 is a conservative approach where she is followed clinically as we have been doing over the years.  She is likely to have progressive issues with worsening diastolic heart failure, LVH, and increased mortality over time with untreated severe aortic stenosis with this approach.  The second option would be more aggressive evaluation to include repeat right and left heart catheterization and CTA studies to evaluate whether she is a candidate for TAVR.  The discussion is set in the context of her markedly reduced functional capacity but otherwise independent lifestyle.  I think a repeat heart catheterization to evaluate for changes in her  coronary anatomy since her last catheterization 7 years ago will be important.  If she has had marked progression of multivessel CAD, not amenable to PCI, we would likely treat her conservatively.  However, if her coronary disease is stable and she continues to be free of angina, we could consider TAVR and  ongoing medical management of her coronary disease.  Additionally, CT angiography would be helpful to determine whether she is a candidate for transfemoral TAVR.  If so, this would be the least invasive way to approach her care.  There would be a high likelihood that she would require permanent pacemaker placement if she undergoes TAVR because of her underlying conduction disease with marked first-degree AV block and history of 2-1 AV block in the past.  The patient is willing to undergo further work-up and explore whether she might be a candidate for TAVR.  I do not think she would be a candidate for conventional heart surgery under any circumstances.  I have reviewed the risks, indications, and alternatives to cardiac catheterization, possible angioplasty, and stenting with the patient. Risks include but are not limited to bleeding, infection, vascular injury, stroke, myocardial infection, arrhythmia, kidney injury, radiation-related injury in the case of prolonged fluoroscopy use, emergency cardiac surgery, and death. The patient understands the risks of serious complication is 1-2 in 2355 with diagnostic cardiac cath and 1-2% or less with angioplasty/stenting.  We will review her case in our multidisciplinary heart team meeting once her CTA and cardiac catheterization studies are completed.  All of her questions were answered today.  I will follow-up with her after her testing is done.      Shared Decision Making/Informed Consent The risks [stroke (1 in 1000), death (1 in 1000), kidney failure [usually temporary] (1 in 500), bleeding (1 in 200), allergic reaction [possibly serious] (1 in 200)], benefits (diagnostic support and management of coronary artery disease) and alternatives of a cardiac catheterization were discussed in detail with Sherry [name] and she is willing to proceed.    Medication Adjustments/Labs and Tests Ordered: Current medicines are reviewed at length with the patient today.   Concerns regarding medicines are outlined above.  Orders Placed This Encounter  Procedures   CT CORONARY MORPH W/CTA COR W/SCORE W/CA W/CM &/OR WO/CM   CT ANGIO CHEST AORTA W/CM & OR WO/CM   CT ANGIO ABDOMEN PELVIS  W &/OR WO CONTRAST   Basic Metabolic Panel (BMET)   Basic Metabolic Panel (BMET)   CBC w/Diff   No orders of the defined types were placed in this encounter.   Patient Instructions  Medication Instructions:  Your physician recommends that you continue on your current medications as directed. Please refer to the Current Medication list given to you today.  *If you need a refill on your cardiac medications before your next appointment, please call your pharmacy*   Lab Work: You will need to have blood drawn on:  Friday January 26, 2021 (fasting after midnight the night before)   AND   Tuesday, February 20, 2021 (non-fasting)  at [facility]. [address]  Open: 7:30am - 5pm    Phone: [phone].     Testing/Procedures: Cardiac CT see letter attached 01/31/21    [place] OFFICE [place], [place] [address] Dept: [phone] Loc: [place]  01/22/2021  You are scheduled for a Cardiac Catheterization on Friday, January  6 with Dr. Sherren Mocha.  1. Please arrive at the [facility] (Main Entrance A) at [facility]: [address] [place], [place] [address] at 5:30 AM (This time is four hours before your procedure to ensure your preparation). Free valet parking service is available.   Special note: Every effort is made to have your procedure done on time. Please understand that emergencies sometimes delay scheduled procedures.  2. Diet: Do not eat solid foods after midnight.   3. Labs: See Above   4. Medication instructions in preparation for your procedure:   Contrast Allergy:  No  Stop taking, Lisinopril (Zestril or Prinivil) Thursday, January 5,2023  Hold Lasix. Furosemide morning of February 23, 2021 Hold Jardiance morning of February 23, 2021   On the morning of your procedure, take your Aspirin and any morning medicines NOT listed above.  You may use sips of water.  5. Plan for one night stay--bring personal belongings. 6. Bring a current list of your medications and current insurance cards. 7. You MUST have a responsible person to drive you home. 8. Someone MUST be with you the first 24 hours after you arrive home or your discharge will be delayed. 9. Please wear clothes that are easy to get on and off and wear slip-on shoes.  Thank you for allowing [place] to care for you!   -- [place] [facility] services    Follow-Up: At [facility], you and your health needs are our priority.  As part of our continuing mission to provide you with exceptional heart care, we have created designated Provider Care Teams.  These Care Teams include your primary Cardiologist (physician) and Advanced Practice Providers (APPs -  Physician Assistants and Nurse Practitioners) who all work together to provide you with the care you need, when you need it.  We recommend signing up for the patient portal called "MyChart".  Sign up information is provided on this After Visit Summary.  MyChart is used to connect with patients for Virtual Visits (Telemedicine).  Patients are able to view lab/test results, encounter notes, upcoming appointments, etc.  Non-urgent messages can be sent to your provider as well.   To learn more about what you can do with MyChart, go to [email or web address].     Signed, Sherren Mocha, MD  01/22/2021 11:32 AM    [place]

## 2021-01-22 NOTE — Patient Instructions (Addendum)
Medication Instructions:  Your physician recommends that you continue on your current medications as directed. Please refer to the Current Medication list given to you today.  *If you need a refill on your cardiac medications before your next appointment, please call your pharmacy*   Lab Work: You will need to have blood drawn on:  Friday January 26, 2021 (fasting after midnight the night before)   AND   Tuesday, February 20, 2021 (non-fasting)  at [facility]. [address]  Open: 7:30am - 5pm    Phone: [phone].     Testing/Procedures: Cardiac CT see letter attached 01/31/21    [facility] OFFICE [place], [place] [address] Dept: [phone] Loc: [place]  01/22/2021  You are scheduled for a Cardiac Catheterization on Friday, January 6 with Dr. Sherren Mocha.  1. Please arrive at the [facility] (Main Entrance A) at [facility]: [address] [place], [place] [address] at 5:30 AM (This time is four hours before your procedure to ensure your preparation). Free valet parking service is available.   Special note: Every effort is made to have your procedure done on time. Please understand that emergencies sometimes delay scheduled procedures.  2. Diet: Do not eat solid foods after midnight.   3. Labs: See Above   4. Medication instructions in preparation for your procedure:   Contrast Allergy: No  Stop taking, Lisinopril (Zestril or Prinivil) Thursday, January 5,2023  Hold Lasix. Furosemide morning of February 23, 2021 Hold Jardiance morning of February 23, 2021   On the morning of your procedure, take your Aspirin and any morning medicines NOT listed above.  You may use sips of water.  5. Plan for one night stay--bring personal belongings. 6. Bring a current list of your medications and current  insurance cards. 7. You MUST have a responsible person to drive you home. 8. Someone MUST be with you the first 24 hours after you arrive home or your discharge will be delayed. 9. Please wear clothes that are easy to get on and off and wear slip-on shoes.  Thank you for allowing [place] to care for you!   -- [place] [facility] services    Follow-Up: At [facility], you and your health needs are our priority.  As part of our continuing mission to provide you with exceptional heart care, we have created designated Provider Care Teams.  These Care Teams include your primary Cardiologist (physician) and Advanced Practice Providers (APPs -  Physician Assistants and Nurse Practitioners) who all work together to provide you with the care you need, when you need it.  We recommend signing up for the patient portal called "MyChart".  Sign up information is provided on this After Visit Summary.  MyChart is used to connect with patients for Virtual Visits (Telemedicine).  Patients are able to view lab/test results, encounter notes, upcoming appointments, etc.  Non-urgent messages can be sent to your provider as well.   To learn more about what you can do with MyChart, go to [email or web address].

## 2021-01-24 ENCOUNTER — Other Ambulatory Visit (HOSPITAL_COMMUNITY): Payer: Self-pay | Admitting: Cardiology

## 2021-01-24 DIAGNOSIS — I5032 Chronic diastolic (congestive) heart failure: Secondary | ICD-10-CM

## 2021-01-25 ENCOUNTER — Other Ambulatory Visit: Payer: Self-pay

## 2021-01-25 ENCOUNTER — Ambulatory Visit (HOSPITAL_COMMUNITY)
Admission: RE | Admit: 2021-01-25 | Discharge: 2021-01-25 | Disposition: A | Discharge status: Home / Self Care | Payer: Medicare Other | Source: Ambulatory Visit | Attending: Cardiology | Admitting: Cardiology

## 2021-01-25 DIAGNOSIS — I5032 Chronic diastolic (congestive) heart failure: Secondary | ICD-10-CM | POA: Diagnosis not present

## 2021-01-25 LAB — BASIC METABOLIC PANEL
Anion gap: 9 (ref 5–15)
BUN: 52 mg/dL — ABNORMAL HIGH (ref 8–23)
CO2: 22 mmol/L (ref 22–32)
Calcium: 8.6 mg/dL — ABNORMAL LOW (ref 8.9–10.3)
Chloride: 109 mmol/L (ref 98–111)
Creatinine, Ser: 1.5 mg/dL — ABNORMAL HIGH (ref 0.44–1.00)
GFR, Estimated: 36 mL/min — ABNORMAL LOW (ref >60)
Glucose, Bld: 82 mg/dL (ref 70–99)
Potassium: 5 mmol/L (ref 3.5–5.1)
Sodium: 140 mmol/L (ref 135–145)

## 2021-01-26 ENCOUNTER — Other Ambulatory Visit: Payer: Medicare Other | Admitting: *Deleted

## 2021-01-26 ENCOUNTER — Other Ambulatory Visit: Payer: Medicare Other

## 2021-01-26 DIAGNOSIS — N1831 Chronic kidney disease, stage 3a: Secondary | ICD-10-CM

## 2021-01-26 DIAGNOSIS — E782 Mixed hyperlipidemia: Secondary | ICD-10-CM

## 2021-01-26 DIAGNOSIS — Z01810 Encounter for preprocedural cardiovascular examination: Secondary | ICD-10-CM

## 2021-01-26 DIAGNOSIS — I5032 Chronic diastolic (congestive) heart failure: Secondary | ICD-10-CM

## 2021-01-26 DIAGNOSIS — I2510 Atherosclerotic heart disease of native coronary artery without angina pectoris: Secondary | ICD-10-CM

## 2021-01-26 DIAGNOSIS — I350 Nonrheumatic aortic (valve) stenosis: Secondary | ICD-10-CM

## 2021-01-26 LAB — BASIC METABOLIC PANEL
BUN/Creatinine Ratio: 37 — ABNORMAL HIGH (ref 12–28)
BUN: 54 mg/dL — ABNORMAL HIGH (ref 8–27)
CO2: 21 mmol/L (ref 20–29)
Calcium: 8.8 mg/dL (ref 8.7–10.3)
Chloride: 106 mmol/L (ref 96–106)
Creatinine, Ser: 1.46 mg/dL — ABNORMAL HIGH (ref 0.57–1.00)
Glucose: 77 mg/dL (ref 70–99)
Potassium: 5.5 mmol/L — ABNORMAL HIGH (ref 3.5–5.2)
Sodium: 140 mmol/L (ref 134–144)
eGFR: 38 mL/min/{1.73_m2} — ABNORMAL LOW (ref >59)

## 2021-01-26 LAB — HEPATIC FUNCTION PANEL
ALT: 20 IU/L (ref 0–32)
AST: 33 IU/L (ref 0–40)
Albumin: 4.2 g/dL (ref 3.7–4.7)
Alkaline Phosphatase: 98 IU/L (ref 44–121)
Bilirubin Total: <0.2 mg/dL (ref 0.0–1.2)
Bilirubin, Direct: <0.1 mg/dL (ref 0.00–0.40)
Total Protein: 7.1 g/dL (ref 6.0–8.5)

## 2021-01-26 LAB — LIPID PANEL
Chol/HDL Ratio: 2.8 ratio (ref 0.0–4.4)
Cholesterol, Total: 179 mg/dL (ref 100–199)
HDL: 64 mg/dL (ref >39)
LDL Chol Calc (NIH): 100 mg/dL — ABNORMAL HIGH (ref 0–99)
Triglycerides: 80 mg/dL (ref 0–149)
VLDL Cholesterol Cal: 15 mg/dL (ref 5–40)

## 2021-01-29 ENCOUNTER — Other Ambulatory Visit: Payer: Self-pay | Admitting: Neurology

## 2021-01-29 MED ORDER — PHENOBARBITAL 64.8 MG PO TABS: 129.6000 mg | ORAL_TABLET | Freq: Every day | ORAL | 3 refills | Status: DC

## 2021-01-29 MED ORDER — ZONISAMIDE 100 MG PO CAPS: ORAL_CAPSULE | ORAL | 3 refills | Status: DC

## 2021-01-30 ENCOUNTER — Telehealth: Payer: Self-pay

## 2021-01-30 DIAGNOSIS — Z01810 Encounter for preprocedural cardiovascular examination: Secondary | ICD-10-CM

## 2021-01-30 DIAGNOSIS — N289 Disorder of kidney and ureter, unspecified: Secondary | ICD-10-CM

## 2021-01-30 DIAGNOSIS — I5032 Chronic diastolic (congestive) heart failure: Secondary | ICD-10-CM

## 2021-01-30 NOTE — Telephone Encounter (Signed)
-----   Message from Sherren Mocha, MD sent at 01/30/2021  6:20 AM EST ----- Stop potassium [facility]) and repeat BMET one week. thx

## 2021-01-30 NOTE — Telephone Encounter (Signed)
The patient has been notified of the result and verbalized understanding.  All questions (if any) were answered. Antonieta Iba, RN 01/30/2021 9:42 AM  Patient will stop potassium and repeat labs in one week.

## 2021-01-30 NOTE — Progress Notes (Signed)
Please stop potassium and repeat BMET in one week. thanks

## 2021-01-31 ENCOUNTER — Ambulatory Visit (HOSPITAL_COMMUNITY)
Admission: RE | Admit: 2021-01-31 | Discharge: 2021-01-31 | Disposition: A | Discharge status: Home / Self Care | Payer: Medicare Other | Source: Ambulatory Visit | Attending: Cardiovascular Disease | Admitting: Cardiovascular Disease

## 2021-01-31 ENCOUNTER — Other Ambulatory Visit (HOSPITAL_COMMUNITY): Payer: Medicare Other

## 2021-01-31 VITALS — BP 158/75 | HR 57 | Temp 97.5°F | Resp 18 | Wt 143.0 lb

## 2021-01-31 DIAGNOSIS — N289 Disorder of kidney and ureter, unspecified: Secondary | ICD-10-CM | POA: Diagnosis present

## 2021-01-31 DIAGNOSIS — I350 Nonrheumatic aortic (valve) stenosis: Secondary | ICD-10-CM | POA: Insufficient documentation

## 2021-01-31 DIAGNOSIS — I5032 Chronic diastolic (congestive) heart failure: Secondary | ICD-10-CM | POA: Insufficient documentation

## 2021-01-31 LAB — BASIC METABOLIC PANEL
Anion gap: 9 (ref 5–15)
BUN: 52 mg/dL — ABNORMAL HIGH (ref 8–23)
CO2: 21 mmol/L — ABNORMAL LOW (ref 22–32)
Calcium: 8.8 mg/dL — ABNORMAL LOW (ref 8.9–10.3)
Chloride: 107 mmol/L (ref 98–111)
Creatinine, Ser: 1.84 mg/dL — ABNORMAL HIGH (ref 0.44–1.00)
GFR, Estimated: 28 mL/min — ABNORMAL LOW (ref >60)
Glucose, Bld: 81 mg/dL (ref 70–99)
Potassium: 4.6 mmol/L (ref 3.5–5.1)
Sodium: 137 mmol/L (ref 135–145)

## 2021-01-31 MED ORDER — SODIUM CHLORIDE 0.9 % WEIGHT BASED INFUSION
3.0000 mL/kg/h | INTRAVENOUS | Status: AC
  Administered 2021-01-31: 09:00:00 3 mL/kg/h via INTRAVENOUS

## 2021-01-31 MED ORDER — IOHEXOL 350 MG/ML SOLN
100.0000 mL | Freq: Once | INTRAVENOUS | Status: AC | PRN
  Administered 2021-01-31: 11:00:00 100 mL via INTRAVENOUS

## 2021-01-31 MED ORDER — SODIUM CHLORIDE 0.9 % WEIGHT BASED INFUSION: 1.0000 mL/kg/h | INTRAVENOUS | Status: DC

## 2021-02-06 ENCOUNTER — Other Ambulatory Visit: Payer: Medicare Other

## 2021-02-07 ENCOUNTER — Telehealth: Payer: Self-pay | Admitting: *Deleted

## 2021-02-07 ENCOUNTER — Other Ambulatory Visit: Payer: Self-pay

## 2021-02-07 ENCOUNTER — Other Ambulatory Visit: Payer: Self-pay | Admitting: Cardiovascular Disease

## 2021-02-07 ENCOUNTER — Other Ambulatory Visit: Payer: Medicare Other

## 2021-02-07 DIAGNOSIS — E875 Hyperkalemia: Secondary | ICD-10-CM

## 2021-02-07 DIAGNOSIS — I5032 Chronic diastolic (congestive) heart failure: Secondary | ICD-10-CM

## 2021-02-07 LAB — BASIC METABOLIC PANEL
BUN/Creatinine Ratio: 42 — ABNORMAL HIGH (ref 12–28)
BUN: 71 mg/dL — ABNORMAL HIGH (ref 8–27)
CO2: 24 mmol/L (ref 20–29)
Calcium: 8.5 mg/dL — ABNORMAL LOW (ref 8.7–10.3)
Chloride: 105 mmol/L (ref 96–106)
Creatinine, Ser: 1.7 mg/dL — ABNORMAL HIGH (ref 0.57–1.00)
Glucose: 85 mg/dL (ref 70–99)
Potassium: 5.6 mmol/L — ABNORMAL HIGH (ref 3.5–5.2)
Sodium: 139 mmol/L (ref 134–144)
eGFR: 31 mL/min/{1.73_m2} — ABNORMAL LOW (ref >59)

## 2021-02-07 MED ORDER — LISINOPRIL 40 MG PO TABS: 20.0000 mg | ORAL_TABLET | Freq: Every day | ORAL | 3 refills | Status: DC

## 2021-02-07 NOTE — Telephone Encounter (Signed)
Potassium 5.6.  Reviewed with Dr Lovena Le (office DOD) and patient should decrease Lisinopril to 20 mg daily.  BMP in a week.  Patient notified.  She confirms she is not taking potassium.  She will split Lisinopril 40 mg tablets in half.  Patient will come in for BMP on 02/14/21

## 2021-02-14 ENCOUNTER — Other Ambulatory Visit: Payer: Medicare Other | Admitting: *Deleted

## 2021-02-14 ENCOUNTER — Other Ambulatory Visit: Payer: Self-pay

## 2021-02-14 DIAGNOSIS — R7989 Other specified abnormal findings of blood chemistry: Secondary | ICD-10-CM

## 2021-02-14 DIAGNOSIS — I5032 Chronic diastolic (congestive) heart failure: Secondary | ICD-10-CM

## 2021-02-14 DIAGNOSIS — E875 Hyperkalemia: Secondary | ICD-10-CM

## 2021-02-14 LAB — BASIC METABOLIC PANEL
BUN/Creatinine Ratio: 42 — ABNORMAL HIGH (ref 12–28)
BUN: 83 mg/dL (ref 8–27)
CO2: 19 mmol/L — ABNORMAL LOW (ref 20–29)
Calcium: 8.6 mg/dL — ABNORMAL LOW (ref 8.7–10.3)
Chloride: 106 mmol/L (ref 96–106)
Creatinine, Ser: 1.96 mg/dL — ABNORMAL HIGH (ref 0.57–1.00)
Glucose: 83 mg/dL (ref 70–99)
Potassium: 4.8 mmol/L (ref 3.5–5.2)
Sodium: 140 mmol/L (ref 134–144)
eGFR: 26 mL/min/{1.73_m2} — ABNORMAL LOW (ref >59)

## 2021-02-15 ENCOUNTER — Telehealth: Payer: Self-pay

## 2021-02-15 ENCOUNTER — Ambulatory Visit: Payer: Medicare Other | Admitting: Neurology

## 2021-02-15 NOTE — Addendum Note (Signed)
Addended by: Ma Hillock on: 02/15/2021 08:27 AM   Modules accepted: Orders

## 2021-02-15 NOTE — Telephone Encounter (Signed)
-----   Message from Sherren Mocha, MD sent at 02/14/2021  5:27 PM EST ----- Please have her stop lisinopril with creatinine trending up and upcoming cath next week. Please recheck a BMET 1/4 to make sure we will be able to safely perform the cath procedure. thx

## 2021-02-15 NOTE — Telephone Encounter (Signed)
Called patient and explained that lab results from 02/14/21 showed elevated creatinine (kidney function levels) and that per Dr Antionette Char order, she should discontinue her Lisinopril 40mg  (0.5 tablets QD) immediately and have BMP redrawn on 02/21/21 (1 week) in order to continue with scheduled cath procedure next week. Pt confirmed that she understood directions, repeated back the name of the drug to stop, and agreed to come to office on 02/21/21 for labs to be redrawn. Pt states that she has not taken her Lisinopril yet today, so will be off for a full week prior to labs. Judson Roch, Triage RN

## 2021-02-20 ENCOUNTER — Other Ambulatory Visit: Payer: Medicare Other

## 2021-02-20 ENCOUNTER — Other Ambulatory Visit: Payer: Medicare Other | Admitting: *Deleted

## 2021-02-20 ENCOUNTER — Other Ambulatory Visit: Payer: Self-pay

## 2021-02-20 DIAGNOSIS — R7989 Other specified abnormal findings of blood chemistry: Secondary | ICD-10-CM

## 2021-02-20 LAB — BASIC METABOLIC PANEL
BUN/Creatinine Ratio: 42 — ABNORMAL HIGH (ref 12–28)
BUN: 78 mg/dL (ref 8–27)
CO2: 20 mmol/L (ref 20–29)
Calcium: 7.8 mg/dL — ABNORMAL LOW (ref 8.7–10.3)
Chloride: 106 mmol/L (ref 96–106)
Creatinine, Ser: 1.84 mg/dL — ABNORMAL HIGH (ref 0.57–1.00)
Glucose: 88 mg/dL (ref 70–99)
Potassium: 5.2 mmol/L (ref 3.5–5.2)
Sodium: 137 mmol/L (ref 134–144)
eGFR: 28 mL/min/{1.73_m2} — ABNORMAL LOW (ref >59)

## 2021-02-20 LAB — CBC WITH DIFFERENTIAL/PLATELET
Basophils Absolute: 0 10*3/uL (ref 0.0–0.2)
Basos: 1 %
EOS (ABSOLUTE): 0.1 10*3/uL (ref 0.0–0.4)
Eos: 7 %
Hematocrit: 30.1 % — ABNORMAL LOW (ref 34.0–46.6)
Hemoglobin: 10.3 g/dL — ABNORMAL LOW (ref 11.1–15.9)
Lymphocytes Absolute: 0.6 10*3/uL — ABNORMAL LOW (ref 0.7–3.1)
Lymphs: 34 %
MCH: 29.9 pg (ref 26.6–33.0)
MCHC: 34.2 g/dL (ref 31.5–35.7)
MCV: 87 fL (ref 79–97)
Monocytes Absolute: 0.3 10*3/uL (ref 0.1–0.9)
Monocytes: 17 %
Neutrophils Absolute: 0.8 10*3/uL — ABNORMAL LOW (ref 1.4–7.0)
Neutrophils: 41 %
Platelets: 62 10*3/uL — CL (ref 150–450)
RBC: 3.45 x10E6/uL — ABNORMAL LOW (ref 3.77–5.28)
RDW: 12.7 % (ref 11.7–15.4)
WBC: 1.9 10*3/uL — CL (ref 3.4–10.8)

## 2021-02-21 ENCOUNTER — Telehealth: Payer: Self-pay | Admitting: Physician Assistant

## 2021-02-21 ENCOUNTER — Telehealth: Payer: Self-pay | Admitting: *Deleted

## 2021-02-21 ENCOUNTER — Other Ambulatory Visit: Payer: Medicare Other

## 2021-02-21 DIAGNOSIS — D72819 Decreased white blood cell count, unspecified: Secondary | ICD-10-CM

## 2021-02-21 DIAGNOSIS — D696 Thrombocytopenia, unspecified: Secondary | ICD-10-CM

## 2021-02-21 NOTE — Telephone Encounter (Addendum)
See 02/20/21 BMP and CBC results. Per Dr Burt Knack: Between her kidney function and the new finding of thrombocytopenia and leukopenia, we should cancel her heart catheterization and make a referral to hematology. ___  Call placed to patient to discuss lab results and Dr Antionette Char recommendations. Patient is aware of plan below: -Cancel [facility] 02/23/21 (this has been done). -Referral to Hematology (this has been done). -Stay off lisinopril for now -Keep follow up appointment in [facility] 03/19/21 -Keep follow up appointment with Dr Burt Knack 05/10/21  Patient states she understands all recommendations/plan, was grateful for call.

## 2021-02-21 NOTE — Telephone Encounter (Signed)
Scheduled appt per 1/4 referral. Pt is aware of appt date and time. Pt is aware to arrive 15 mins prior to appt time.  °

## 2021-02-23 ENCOUNTER — Ambulatory Visit (HOSPITAL_COMMUNITY)
Admission: RE | Admit: 2021-02-23 | Payer: Commercial Managed Care - HMO | Source: Home / Self Care | Admitting: Cardiovascular Disease

## 2021-02-23 ENCOUNTER — Encounter (HOSPITAL_COMMUNITY): Admission: RE | Payer: Self-pay | Source: Home / Self Care

## 2021-02-23 SURGERY — RIGHT/LEFT HEART CATH AND CORONARY ANGIOGRAPHY
Anesthesia: LOCAL

## 2021-03-06 ENCOUNTER — Telehealth: Payer: Self-pay | Admitting: Cardiovascular Disease

## 2021-03-06 NOTE — Telephone Encounter (Signed)
Called Sherry [name] pt daughter ok per DPR.  Informed her that pt is seeing cancer center d/t thrombocytopenia and leukopenia. I explained what this means to daughter.  Daughter thanked me for call and quickly switched over to another call.

## 2021-03-06 NOTE — Telephone Encounter (Signed)
Daughter of the patient called. She would like more information as to why Dr. Burt Knack referred her Mother over to the [facility].

## 2021-03-07 ENCOUNTER — Telehealth: Payer: Self-pay | Admitting: Physician Assistant

## 2021-03-07 ENCOUNTER — Inpatient Hospital Stay: Payer: Commercial Managed Care - HMO

## 2021-03-07 ENCOUNTER — Inpatient Hospital Stay: Payer: Commercial Managed Care - HMO | Admitting: Physician Assistant

## 2021-03-07 NOTE — Telephone Encounter (Signed)
Pt called in to r/s her new hem appt due to her being sick. I spoke to pt and r/s appt per request. She is aware of new appt date and time.

## 2021-03-19 ENCOUNTER — Encounter (HOSPITAL_COMMUNITY): Payer: Self-pay

## 2021-03-19 ENCOUNTER — Other Ambulatory Visit: Payer: Self-pay

## 2021-03-19 ENCOUNTER — Ambulatory Visit (HOSPITAL_COMMUNITY)
Admission: RE | Admit: 2021-03-19 | Discharge: 2021-03-19 | Disposition: A | Discharge status: Home / Self Care | Payer: Medicare Other | Source: Ambulatory Visit | Attending: Family Medicine | Admitting: Family Medicine

## 2021-03-19 VITALS — BP 180/96 | HR 60 | Wt 140.0 lb

## 2021-03-19 DIAGNOSIS — D696 Thrombocytopenia, unspecified: Secondary | ICD-10-CM

## 2021-03-19 DIAGNOSIS — I130 Hypertensive heart and chronic kidney disease with heart failure and stage 1 through stage 4 chronic kidney disease, or unspecified chronic kidney disease: Secondary | ICD-10-CM | POA: Diagnosis not present

## 2021-03-19 DIAGNOSIS — E782 Mixed hyperlipidemia: Secondary | ICD-10-CM

## 2021-03-19 DIAGNOSIS — Z7982 Long term (current) use of aspirin: Secondary | ICD-10-CM | POA: Insufficient documentation

## 2021-03-19 DIAGNOSIS — Z7984 Long term (current) use of oral hypoglycemic drugs: Secondary | ICD-10-CM | POA: Insufficient documentation

## 2021-03-19 DIAGNOSIS — D72819 Decreased white blood cell count, unspecified: Secondary | ICD-10-CM | POA: Insufficient documentation

## 2021-03-19 DIAGNOSIS — Z87440 Personal history of urinary (tract) infections: Secondary | ICD-10-CM | POA: Insufficient documentation

## 2021-03-19 DIAGNOSIS — Z79899 Other long term (current) drug therapy: Secondary | ICD-10-CM | POA: Diagnosis not present

## 2021-03-19 DIAGNOSIS — I10 Essential (primary) hypertension: Secondary | ICD-10-CM | POA: Diagnosis not present

## 2021-03-19 DIAGNOSIS — N183 Chronic kidney disease, stage 3 unspecified: Secondary | ICD-10-CM | POA: Insufficient documentation

## 2021-03-19 DIAGNOSIS — I350 Nonrheumatic aortic (valve) stenosis: Secondary | ICD-10-CM | POA: Diagnosis not present

## 2021-03-19 DIAGNOSIS — N1831 Chronic kidney disease, stage 3a: Secondary | ICD-10-CM | POA: Diagnosis not present

## 2021-03-19 DIAGNOSIS — E785 Hyperlipidemia, unspecified: Secondary | ICD-10-CM | POA: Diagnosis not present

## 2021-03-19 DIAGNOSIS — I5032 Chronic diastolic (congestive) heart failure: Secondary | ICD-10-CM | POA: Insufficient documentation

## 2021-03-19 DIAGNOSIS — I2510 Atherosclerotic heart disease of native coronary artery without angina pectoris: Secondary | ICD-10-CM | POA: Diagnosis not present

## 2021-03-19 DIAGNOSIS — I440 Atrioventricular block, first degree: Secondary | ICD-10-CM | POA: Diagnosis not present

## 2021-03-19 LAB — LIPID PANEL
Cholesterol: 167 mg/dL (ref 0–200)
HDL: 63 mg/dL (ref >40)
LDL Cholesterol: 92 mg/dL (ref 0–99)
Total CHOL/HDL Ratio: 2.7 RATIO
Triglycerides: 62 mg/dL (ref <150)
VLDL: 12 mg/dL (ref 0–40)

## 2021-03-19 LAB — BASIC METABOLIC PANEL
Anion gap: 10 (ref 5–15)
BUN: 38 mg/dL — ABNORMAL HIGH (ref 8–23)
CO2: 25 mmol/L (ref 22–32)
Calcium: 8.7 mg/dL — ABNORMAL LOW (ref 8.9–10.3)
Chloride: 107 mmol/L (ref 98–111)
Creatinine, Ser: 1.42 mg/dL — ABNORMAL HIGH (ref 0.44–1.00)
GFR, Estimated: 39 mL/min — ABNORMAL LOW (ref >60)
Glucose, Bld: 83 mg/dL (ref 70–99)
Potassium: 3.7 mmol/L (ref 3.5–5.1)
Sodium: 142 mmol/L (ref 135–145)

## 2021-03-19 LAB — BRAIN NATRIURETIC PEPTIDE: B Natriuretic Peptide: 991.7 pg/mL — ABNORMAL HIGH (ref 0.0–100.0)

## 2021-03-19 MED ORDER — AMLODIPINE BESYLATE 5 MG PO TABS: 10.0000 mg | ORAL_TABLET | Freq: Every day | ORAL | 3 refills | Status: DC

## 2021-03-19 NOTE — Patient Instructions (Signed)
Medication Changes:  Increase Amlodipine to 10mg  Daily  Lab Work:  Labs done today, your results will be available in MyChart, we will contact you for abnormal readings.   Testing/Procedures:  none  Referrals:  none  Special Instructions // Education:  none  Follow-Up in: 3 months  At the [facility], you and your health needs are our priority. We have a designated team specialized in the treatment of Heart Failure. This Care Team includes your primary Heart Failure Specialized Cardiologist (physician), Advanced Practice Providers (APPs- Physician Assistants and Nurse Practitioners), and Pharmacist who all work together to provide you with the care you need, when you need it.   You may see any of the following providers on your designated Care Team at your next follow up:  Dr Glori Bickers Dr Haynes Kerns, NP Lyda Jester, [facility] [place], [place] Audry Riles, PharmD   Please be sure to bring in all your medications bottles to every appointment.   Need to Contact us:  If you have any questions or concerns before your next appointment please send [place] a message through [facility] or call our office at [phone].    TO LEAVE A MESSAGE FOR THE NURSE SELECT OPTION 2, PLEASE LEAVE A MESSAGE INCLUDING: YOUR NAME DATE OF BIRTH CALL BACK NUMBER REASON FOR CALL**this is important as we prioritize the call backs  YOU WILL RECEIVE A CALL BACK THE SAME DAY AS LONG AS YOU CALL BEFORE 4:00 PM

## 2021-03-19 NOTE — Progress Notes (Signed)
PCP: Idamae Schuller, MD Cardiology: Dr. Burt Knack HF Cardiology: Dr. Aundra Dubin  76 y.o. with history of aortic stenosis, chronic diastolic CHF, and CAD was referred by Dr. Burt Knack for evaluation of CHF.  Based on 12/21 echo and RHC/LHC in 12/21, the patient has moderate aortic stenosis. She had coronary angiography in 4/15 with rather diffuse moderate-severe coronary disease that was managed medically.  Cardiac MRI in 11/19 was concerning for cardiac amyloidosis. However, PYP scan was negative in 11/19.  Workup at that time also did not show a monoclonal protein to suggest AL amyloidosis.    Echo in 11/22 showed EF 60-65%, severe LVH, normal RV size and systolic function, mild MR, paradoxical low flow/low gradient severe aortic stenosis with mean gradient 24 mmHg and AVA 0.88 cm^2, small to moderate pericardial effusion.  PYP scan was repeated in 11/22, grade 1 with H/CL 1.07.  This is unlikely to be TTR cardiac amyloidosis.  Repeat urine immunofixation and myeloma panel were negative as well.  [employer] gene testing negative for hATTR mutation.   Referred to Dr. Burt Knack to start TAVR work up. Found to have significant thrombocytopenia and leukopenia. R/LHC cancelled and she was referred to Hem/Onc for further work up.  Today she returns for HF follow up with her husband. She is wheelchair bound due to severe arthritis. She can stand up to clean her kitchen counter for a few minutes but remains weak. No significant dyspnea with this or with transfers to the restroom. Denies abnormal bleeding, palpitations, CP, dizziness, edema, or PND/Orthopnea. Appetite ok. No fever or chills. Weight at home 140 pounds. Taking all medications. sBP at home 150s.   ECG (personally reviewed); SB 1st degree AVB, PR 366 msec  Labs (9/22): LDL 86, K 4.9, creatinine 1.26. Labs (10/22): urine immunofixation negative, myeloma panel negative, BNP 476, K 4.6, creatinine 1.54  Labs (1/23): K 5.2, creatinine 1.84, WBC 1.9, Plt  62  PMH: 1. CKD stage 3 2. H/o seizure disorder 3. H/o TIA 4. H/o Mobitz type 1 2nd degree AVB 5. HTN 6. CAD: LHC (4/15) with 80% and 50% tandem stenoses in the proximal LAD, 80% mid LCx, 85% dLCx, diffuse mid-distal disease up to 90% in RCA.  Diffuse coronary disease, treated medically.  7. Aortic stenosis: Suspect moderate AS. - Echo (12/21): AoV mean gradient 32, AVA 1.2 cm^2.  - RHC/LHC (12/21): AoV mean gradient 22, AVA 1.47 cm^2.  - Echo (11/22): EF 60-65%, severe LVH, normal RV size and systolic function, mild MR, paradoxical low flow/low gradient severe aortic stenosis with mean gradient 24 mmHg and AVA 0.88 cm^2, small to moderate pericardial effusion.   8. Chronic diastolic CHF:  - Cardiac MRI (11/19): EF 76%, moderate LVH, difficult to null LV myocardium with diffuse subendocardial LGE concerning for possible cardiac amyloidosis, moderate pericardial effusion.  - PYP scan (11/19): grade 0, H/CL 1.08.  - Echo (12/21): EF 60-65%, moderate LVH, normal RV, moderate pericardial effusion, moderate AS with AoV mean gradient 32, AVA 1.2 cm^2.  - Echo (11/22): EF 60-65%, severe LVH, normal RV size and systolic function, mild MR, paradoxical low flow/low gradient severe aortic stenosis with mean gradient 24 mmHg and AVA 0.88 cm^2, small to moderate pericardial effusion.   - PYP scan (11/22): grade 1 with H/CL 1.07.  This is unlikely to be TTR cardiac amyloidosis. - [employer] gene testing negative for hATTR mutation.  9. Arthritis: Severe, limited to wheelchair.  10. Hyperlipidemia  Social History   Socioeconomic History   Marital status: Married  Spouse name: Not on file   Number of children: 2   Years of education: 4   Highest education level: Not on file  Occupational History   Occupation: retired  Tobacco Use   Smoking status: Former    Types: Cigarettes    Quit date: 06/03/2000    Years since quitting: 20.8   Smokeless tobacco: Never  Vaping Use   Vaping Use: Never used   Substance and Sexual Activity   Alcohol use: No    Alcohol/week: 0.0 standard drinks   Drug use: No   Sexual activity: Not Currently    Partners: Male  Other Topics Concern   Not on file  Social History Narrative   Current Social History 06/20/2020        Patient lives with spouse in a home which is 1 story. There are not steps up to the entrance, the patient uses a ramp.       Patient's method of transportation is [employer].      The highest level of education was some high school.      The patient currently disabled.      Identified important Relationships are "God and my husband"       Pets : None       Interests / Fun: "Sitting on my front porch"       Current Stressors: None              Social Determinants of Radio broadcast assistant Strain: Not on file  Food Insecurity: Not on file  Transportation Needs: Not on file  Physical Activity: Not on file  Stress: Not on file  Social Connections: Not on file  Intimate Partner Violence: Not on file   Family History  Problem Relation Age of Onset   Hypertension Other    Heart disease Mother    Hypertension Mother    Cancer Father    Obesity Son    Asthma Sister    Heart disease Sister    Heart disease Sister    ROS: All systems reviewed and negative except as per HPI.   Current Outpatient Medications  Medication Sig Dispense Refill   alendronate (FOSAMAX) 70 MG tablet Take 35 mg by mouth every Friday. Take with a full glass of water on an empty stomach. Taking 1/2 tablet once weekly     amLODipine (NORVASC) 5 MG tablet Take 1 tablet (5 mg total) by mouth daily. 180 tablet 3   aspirin 81 MG EC tablet Take 81 mg by mouth daily.     Calcium Carb-Cholecalciferol (CALCIUM 500 +D) 500-400 MG-UNIT TABS Take 1 tablet by mouth 2 (two) times daily. 60 tablet 2   empagliflozin (JARDIANCE) 10 MG TABS tablet Take 1 tablet (10 mg total) by mouth daily before breakfast. 90 tablet 3   lidocaine (LIDODERM) 5 % Place 1 patch onto  the skin daily. Remove & Discard patch within 12 hours or as directed by MD 30 patch 0   Liniments (BLUE-EMU SUPER STRENGTH EX) Apply 1 application topically in the morning and at bedtime.     pantoprazole (PROTONIX) 40 MG tablet Take 1 tablet (40 mg total) by mouth daily. 90 tablet 3   PHENobarbital (LUMINAL) 64.8 MG tablet Take 2 tablets (129.6 mg total) by mouth at bedtime. 180 tablet 3   rosuvastatin (CRESTOR) 20 MG tablet Take 1 tablet (20 mg total) by mouth daily. 90 tablet 3   zonisamide (ZONEGRAN) 100 MG capsule Take 2 capsules every night  180 capsule 3   No current facility-administered medications for this encounter.   Wt Readings from Last 3 Encounters:  03/19/21 63.5 kg (140 lb)  01/31/21 64.9 kg (143 lb)  01/22/21 64.9 kg (143 lb)   BP (!) 180/96    Pulse 60    Wt 63.5 kg (140 lb)    SpO2 98%    BMI 26.45 kg/m  General:  NAD. No resp difficulty, arrived in [facility], weak-appearing. HEENT: Normal Neck: Supple. No JVD. Carotids 2+ bilat; no bruits. No lymphadenopathy or thryomegaly appreciated. Cor: PMI nondisplaced. Regular rate & rhythm. No rubs, gallops, 3/6 SEM RUSB, diminished S2 Lungs: Clear Abdomen: Soft, nontender, nondistended. No hepatosplenomegaly. No bruits or masses. Good bowel sounds. Extremities: No cyanosis, clubbing, rash, edema Neuro: Alert & oriented x 3, cranial nerves grossly intact. Moves all 4 extremities w/o difficulty. Affect pleasant.  1. Aortic stenosis: 12/21 LHC/RHC and echo were suggestive of moderate aortic stenosis. She has muffling of S2 on exam.  Repeat echo in 11/22 showed paradoxical low flow/low gradient severe aortic stenosis.   - TAVR work up on hold with Dr. Burt Knack due to new thrombocytopenia and leukopenia (see #8). 2. CAD: Moderate-severe diffuse CAD on coronary angiography in 4/15.  She does not have anginal symptoms but is also minimally active.  She would not be a candidate for CABG due to lack of mobility.  - Medically managed given no  chest pain.  - Continue ASA 81.  - Continue Crestor. - Will need coronary angiography prior to TAVR.  3. CKD stage 3: BMET today. 4. Chronic diastolic CHF: Cardiac MRI (see above) was concerning for cardiac amyloidosis (11/19), as was echo with moderate LVH and aortic stenosis. The aortic stenosis and the pericardial effusion seen by echo are also frequently seen with cardiac amyloidosis. She additionally has tingling/numbness in hands/feet that is consistent with peripheral neuropathy.  However, PYP scan in 11/19 was negative and myeloma workup was negative.  Repeat PYP scan in 11/22 was again negative and she did not have a transthyretin gene mutation on [employer] gene testing.  Myeloma workup was again negative in 10/22.  Therefore, cardiac amyloidosis seems unlikely and LVH is more likely to be due to HTN and aortic stenosis.  She is not volume overloaded on exam today, weight down 3 lbs.  Very minimally active, so hard to define symptoms (none at rest).   - Continue Lasix 40 mg daily, check BMET/BNP today.  - Continue Jardiance 10 mg daily (no recent UTI or yeast infection).  - Off lisinopril with hyperkalemia. 5. HTN: BP elevated today but she has not had meds yet. Systolic BP at home ~ 916B. - Increase amlodipine to 10 mg daily.  6. Conduction abnormality: History of Mobitiz Type 1. Long 1st degree AVB 366 msec on ECG today.  - Avoid beta blockade.  7. Hyperlipidemia: Continue Crestor. Check lipids today.  8. Thrombocytopenia & leukopenia: New finding on pre-cath labs. Plt 68 and WBC 1.9. Denies abnormal bleeding, says she did just get over recent cold. - She has Heme/Onc follow up next week.  Follow up with Dr. Aundra Dubin in 2-3 months.  [place] 03/19/2021

## 2021-03-20 ENCOUNTER — Telehealth (HOSPITAL_COMMUNITY): Payer: Self-pay | Admitting: Cardiology

## 2021-03-20 DIAGNOSIS — I5032 Chronic diastolic (congestive) heart failure: Secondary | ICD-10-CM

## 2021-03-20 MED ORDER — FUROSEMIDE 40 MG PO TABS: 60.0000 mg | ORAL_TABLET | Freq: Every day | ORAL | 3 refills | Status: DC

## 2021-03-20 MED ORDER — POTASSIUM CHLORIDE CRYS ER 20 MEQ PO TBCR: 20.0000 meq | EXTENDED_RELEASE_TABLET | Freq: Every day | ORAL | 3 refills | Status: DC

## 2021-03-20 NOTE — Telephone Encounter (Signed)
Patient called.  Patient aware. Will have labs repeated a hematology appt. 2/7

## 2021-03-20 NOTE — Telephone Encounter (Signed)
-----   Message from Rafael Bihari, [place] sent at 03/19/2021  1:53 PM EST ----- BNP elevated. Please increase Lasix to 60 mg daily + 20 KCL daily. Repeat BMET in 10-14 days.

## 2021-03-26 ENCOUNTER — Other Ambulatory Visit: Payer: Self-pay | Admitting: Neurology

## 2021-03-26 NOTE — Progress Notes (Signed)
[place] [phone]   Fax:([phone]) [place] NOTE  Patient Care Team: Idamae Schuller, MD as PCP - Cyndia Diver, MD as PCP - Cardiology (Cardiology) Cameron Sprang, MD as Consulting Physician (Neurology)  CHIEF COMPLAINTS/PURPOSE OF CONSULTATION:  Pancytopenia  HISTORY OF PRESENTING ILLNESS:  Sherry [name] 76 y.o. female with medical history significant for CKD Stage 3, h/o seizure disorder, h/o TIA, HTN, CAD, aortic stenosis, chronic diastolic CHF, hyperlipidemia and vitamin D deficiency. Patient is accompanied by her husband for this visit.   On review of the previous records, Sherry [name] has chronic normocytic anemia that has been present for several years. Labs from 02/20/2021 shows acute neutropenia with ANC 800 K/uL and thrombocytopenia with platelet count of 62 K/uL.   On exam today, Sherry [name] reports that her energy levels are stable and she does not experience any fatigue. She is able to complete her normal daily routines. She uses a power wheelchair at home to ambulate due to chronic lower leg arthritis. She has a good appetite and denies any recent weight changes. She denies nausea, vomiting or abdominal pain. She has chronic constipation with a bowel movement every 7 days. She has tried various stool softeners/laxatives with some improvement. She denies easy bruising or signs of active bleeding. She denies fevers, chills, night sweats, shortness of breath, chest pain or cough. She has no other complaints. Rest of the 10 point ROS is below.   MEDICAL HISTORY:  Past Medical History:  Diagnosis Date   AKI (acute kidney injury) ([place]) 01/31/2020   Anemia    Aortic stenosis    Arthritis    Bilateral lower extremity edema    Bilateral lower extremity edema    Blurry vision, bilateral 05/08/2016   Bradycardia    a. 01/2013 - asymptomatic.   Chronic diastolic heart failure (HCC)    CKD (chronic kidney disease)    a.  baseline CKD stage III   Coronary artery disease    GERD (gastroesophageal reflux disease)    Headache(784.0)    Heart block AV second degree 02/15/2013   Heart murmur    Hyperlipidemia    Hypertension    Hypotension    Hypovolemic shock ([place])    Internal and external hemorrhoids without complication 85/09/8500   Colonoscopy in 2008 recs repeat in 5 years   Intractable focal epilepsy with impairment of consciousness ([place]) 01/02/2012   Mobitz (type) I (Wenckebach's) atrioventricular block    a. 01/2013 - asymptomatic.   Morbid obesity ([place])    Seizure disorder ([place])    Seizures ([place])    TIA (transient ischemic attack) 2014   pt stated she had "mini strokes"   Vitamin D deficiency 04/22/2014    SURGICAL HISTORY: Past Surgical History:  Procedure Laterality Date   ABDOMINAL HYSTERECTOMY     LEFT AND RIGHT HEART CATHETERIZATION WITH CORONARY ANGIOGRAM N/A 05/24/2013   Procedure: LEFT AND RIGHT HEART CATHETERIZATION WITH CORONARY ANGIOGRAM;  Surgeon: Sinclair Grooms, MD;  Location: [facility] CATH LAB;  Service: Cardiovascular;  Laterality: N/A;   LEFT HEART CATH N/A 02/01/2020   Procedure: Left Heart Cath;  Surgeon: Jolaine Artist, MD;  Location: [facility] CV LAB;  Service: Cardiovascular;  Laterality: N/A;   RIGHT HEART CATH N/A 02/01/2020   Procedure: RIGHT HEART CATH;  Surgeon: Jolaine Artist, MD;  Location: [facility] CV LAB;  Service: Cardiovascular;  Laterality: N/A;    SOCIAL HISTORY: Social History   Socioeconomic History  Marital status: Married    Spouse name: Not on file   Number of children: 2   Years of education: 4   Highest education level: Not on file  Occupational History   Occupation: retired  Tobacco Use   Smoking status: Former    Packs/day: 0.50    Years: 10.00    Pack years: 5.00    Types: Cigarettes    Quit date: 06/03/2000    Years since quitting: 20.8   Smokeless tobacco: Never  Vaping Use   Vaping Use: Never used  Substance and Sexual  Activity   Alcohol use: No    Alcohol/week: 0.0 standard drinks   Drug use: No   Sexual activity: Not Currently    Partners: Male  Other Topics Concern   Not on file  Social History Narrative   Current Social History 06/20/2020        Patient lives with spouse in a home which is 1 story. There are not steps up to the entrance, the patient uses a ramp.       Patient's method of transportation is [employer].      The highest level of education was some high school.      The patient currently disabled.      Identified important Relationships are "God and my husband"       Pets : None       Interests / Fun: "Sitting on my front porch"       Current Stressors: None              Social Determinants of Radio broadcast assistant Strain: Not on file  Food Insecurity: Not on file  Transportation Needs: Not on file  Physical Activity: Not on file  Stress: Not on file  Social Connections: Not on file  Intimate Partner Violence: Not on file    FAMILY HISTORY: Family History  Problem Relation Age of Onset   Hypertension Other    Heart disease Mother    Hypertension Mother    Cancer Father    Obesity Son    Asthma Sister    Heart disease Sister    Heart disease Sister     ALLERGIES:  is allergic to ibuprofen.  MEDICATIONS:  Current Outpatient Medications  Medication Sig Dispense Refill   alendronate (FOSAMAX) 70 MG tablet Take 35 mg by mouth every Friday. Take with a full glass of water on an empty stomach. Taking 1/2 tablet once weekly     amLODipine (NORVASC) 5 MG tablet Take 2 tablets (10 mg total) by mouth daily. 180 tablet 3   aspirin 81 MG EC tablet Take 81 mg by mouth daily.     Calcium Carb-Cholecalciferol (CALCIUM 500 +D) 500-400 MG-UNIT TABS Take 1 tablet by mouth 2 (two) times daily. 60 tablet 2   empagliflozin (JARDIANCE) 10 MG TABS tablet Take 1 tablet (10 mg total) by mouth daily before breakfast. 90 tablet 3   furosemide (LASIX) 40 MG tablet Take 1.5 tablets  (60 mg total) by mouth daily. 135 tablet 3   lidocaine (LIDODERM) 5 % Place 1 patch onto the skin daily. Remove & Discard patch within 12 hours or as directed by MD 30 patch 0   Liniments (BLUE-EMU SUPER STRENGTH EX) Apply 1 application topically in the morning and at bedtime.     pantoprazole (PROTONIX) 40 MG tablet Take 1 tablet (40 mg total) by mouth daily. 90 tablet 3   PHENobarbital (LUMINAL) 64.8 MG tablet Take 2  tablets (129.6 mg total) by mouth at bedtime. 60 tablet 5   potassium chloride SA (KLOR-CON M) 20 MEQ tablet Take 1 tablet (20 mEq total) by mouth daily. 90 tablet 3   rosuvastatin (CRESTOR) 20 MG tablet Take 1 tablet (20 mg total) by mouth daily. 90 tablet 3   zonisamide (ZONEGRAN) 100 MG capsule Take 2 capsules every night 180 capsule 3   No current facility-administered medications for this visit.    REVIEW OF SYSTEMS:   Constitutional: ( - ) fevers, ( - )  chills , ( - ) night sweats Eyes: ( - ) blurriness of vision, ( - ) double vision, ( - ) watery eyes Ears, nose, mouth, throat, and face: ( - ) mucositis, ( - ) sore throat Respiratory: ( - ) cough, ( - ) dyspnea, ( - ) wheezes Cardiovascular: ( - ) palpitation, ( - ) chest discomfort, ( - ) lower extremity swelling Gastrointestinal:  ( - ) nausea, ( - ) heartburn, ( - ) change in bowel habits Skin: ( - ) abnormal skin rashes Lymphatics: ( - ) new lymphadenopathy, ( - ) easy bruising Neurological: ( - ) numbness, ( - ) tingling, ( - ) new weaknesses Behavioral/Psych: ( - ) mood change, ( - ) new changes  All other systems were reviewed with the patient and are negative.  PHYSICAL EXAMINATION: ECOG PERFORMANCE STATUS: 1 - Symptomatic but completely ambulatory  Vitals:   03/27/21 1056 03/27/21 1119  BP: (!) 191/83 (!) 176/79  Pulse: (!) 57   Resp: 18   Temp: 98.2 F (36.8 C)   SpO2: 100%    Filed Weights   03/27/21 1056  Weight: 140 lb 12.8 oz (63.9 kg)    GENERAL: well appearing female in NAD  SKIN: skin  color, texture, turgor are normal, no rashes or significant lesions EYES: conjunctiva are pink and non-injected, sclera clear OROPHARYNX: no exudate, no erythema; lips, buccal mucosa, and tongue normal  NECK: supple, non-tender LYMPH:  no palpable lymphadenopathy in the cervical or supraclavicular lymph nodes.  LUNGS: clear to auscultation and percussion with normal breathing effort HEART: regular rate & rhythm and no murmurs and no lower extremity edema ABDOMEN: soft, non-tender, non-distended, normal bowel sounds Musculoskeletal: no cyanosis of digits and no clubbing  PSYCH: alert & oriented x 3, fluent speech NEURO: no focal motor/sensory deficits  LABORATORY DATA:  I have reviewed the data as listed CBC Latest Ref Rng & Units 03/27/2021 02/20/2021 02/03/2020  WBC 4.0 - 10.5 K/uL 2.0(L) 1.9(LL) 4.7  Hemoglobin 12.0 - 15.0 g/dL 11.3(L) 10.3(L) 10.0(L)  Hematocrit 36.0 - 46.0 % 35.3(L) 30.1(L) 29.6(L)  Platelets 150 - 400 K/uL 196 62(LL) 301    CMP Latest Ref Rng & Units 03/27/2021 03/19/2021 02/20/2021  Glucose 70 - 99 mg/dL 77 83 88  BUN 8 - 23 mg/dL 45(H) 38(H) 78(HH)  Creatinine 0.44 - 1.00 mg/dL 1.47(H) 1.42(H) 1.84(H)  Sodium 135 - 145 mmol/L 140 142 137  Potassium 3.5 - 5.1 mmol/L 3.4(L) 3.7 5.2  Chloride 98 - 111 mmol/L 107 107 106  CO2 22 - 32 mmol/L 23 25 20   Calcium 8.9 - 10.3 mg/dL 8.6(L) 8.7(L) 7.8(L)  Total Protein 6.5 - 8.1 g/dL 7.5 - -  Total Bilirubin 0.3 - 1.2 mg/dL 0.4 - -  Alkaline Phos 38 - 126 U/L 80 - -  AST 15 - 41 U/L 25 - -  ALT 0 - 44 U/L 17 - -    ASSESSMENT & PLAN  Sherry [name] is a 76 y.o. female who presents for initial evaluation for pancytopenia. We reviewed underlying etiologies nutritional deficiencies, inflammatory process, viral infectious, paraproteinemia and bone marrow disorders.   We will proceed with serologic workup today to check CBC, CMP, Ferritin, iron and TIBC, vitamin B12, methylmalonic acid, folate, hepatitis B and C serologies,  HIV serology, sedimentation rate, c-reactive protein and save smear.   If above workup is unremarkable, we will recommend a bone marrow biopsy to further evaluate. We will follow up by phone to review the results and discuss next steps.   Orders Placed This Encounter  Procedures   CBC with Differential ([facility] Only)    Standing Status:   Future    Number of Occurrences:   1    Standing Expiration Date:   03/26/2022   CMP ([facility] [facility] only)    Standing Status:   Future    Number of Occurrences:   1    Standing Expiration Date:   03/26/2022   Ferritin    Standing Status:   Future    Number of Occurrences:   1    Standing Expiration Date:   03/26/2022   Retic Panel    Standing Status:   Future    Number of Occurrences:   1    Standing Expiration Date:   03/26/2022   Iron and Iron Binding Capacity ([facility] only)    Standing Status:   Future    Number of Occurrences:   1    Standing Expiration Date:   03/26/2022   Vitamin B12    Standing Status:   Future    Number of Occurrences:   1    Standing Expiration Date:   03/26/2022   Folate, Serum    Standing Status:   Future    Number of Occurrences:   1    Standing Expiration Date:   03/26/2022   Sedimentation rate    Standing Status:   Future    Number of Occurrences:   1    Standing Expiration Date:   03/26/2022   C-reactive protein    Standing Status:   Future    Number of Occurrences:   1    Standing Expiration Date:   03/26/2022   Hepatitis B surface antibody    Standing Status:   Future    Number of Occurrences:   1    Standing Expiration Date:   03/26/2022   Hepatitis B surface antigen    Standing Status:   Future    Number of Occurrences:   1    Standing Expiration Date:   03/26/2022   Hepatitis C antibody    Standing Status:   Future    Number of Occurrences:   1    Standing Expiration Date:   03/26/2022   Hepatitis B core antibody, total    Standing Status:   Future    Number of Occurrences:   1    Standing Expiration  Date:   03/26/2022   HIV antibody (with reflex)    Standing Status:   Future    Number of Occurrences:   1    Standing Expiration Date:   03/26/2022   Save Smear (SS[facility])    Standing Status:   Future    Number of Occurrences:   1    Standing Expiration Date:   03/26/2022    All questions were answered. The patient knows to call the clinic with any problems, questions or concerns.  I  have spent a total of 60 minutes minutes of face-to-face and non-face-to-face time, preparing to see the patient, obtaining and/or reviewing separately obtained history, performing a medically appropriate examination, counseling and educating the patient, ordering tests/procedures, documenting clinical information in the electronic health record and care coordination.   [name] Query, PA-C Department of Hematology/Oncology [place] at [facility] Phone: [phone]  Patient was seen with Dr. Lorenso Courier  I have read the above note and personally examined the patient. I agree with the assessment and plan as noted above.  Briefly Sherry [name] is a 76 year old female who presents for evaluation of pancytopenia.  On 02/20/2021 the patient was noted to have white blood cell count of 1.9, hemoglobin 10.3, MCV 87, platelet count of 62.  Previously on 02/03/2020 the patient was noted to have a hemoglobin of 10.0 with a white blood cell count of 4.7 and platelets of 301.  Due to concern for these findings we will do a full pancytopenia evaluation to include viral serologies, nutritional panel, and if necessary a bone marrow biopsy.  The patient voiced understanding of this plan moving forward.   Ledell Peoples, MD Department of Hematology/Oncology [place] at [facility] Phone: [phone] Pager: [phone] Email: [email or web address]

## 2021-03-27 ENCOUNTER — Other Ambulatory Visit: Payer: Self-pay | Admitting: Neurology

## 2021-03-27 ENCOUNTER — Other Ambulatory Visit: Payer: Self-pay

## 2021-03-27 ENCOUNTER — Inpatient Hospital Stay: Payer: Medicare Other

## 2021-03-27 ENCOUNTER — Encounter: Payer: Self-pay | Admitting: Physician Assistant

## 2021-03-27 ENCOUNTER — Inpatient Hospital Stay: Payer: Medicare Other | Attending: Physician Assistant | Admitting: Physician Assistant

## 2021-03-27 VITALS — BP 176/79 | HR 57 | Temp 98.2°F | Resp 18 | Wt 140.8 lb

## 2021-03-27 DIAGNOSIS — D61818 Other pancytopenia: Secondary | ICD-10-CM | POA: Diagnosis not present

## 2021-03-27 DIAGNOSIS — N183 Chronic kidney disease, stage 3 unspecified: Secondary | ICD-10-CM | POA: Insufficient documentation

## 2021-03-27 DIAGNOSIS — I130 Hypertensive heart and chronic kidney disease with heart failure and stage 1 through stage 4 chronic kidney disease, or unspecified chronic kidney disease: Secondary | ICD-10-CM | POA: Insufficient documentation

## 2021-03-27 DIAGNOSIS — I5032 Chronic diastolic (congestive) heart failure: Secondary | ICD-10-CM | POA: Insufficient documentation

## 2021-03-27 LAB — CBC WITH DIFFERENTIAL (CANCER CENTER ONLY)
Abs Immature Granulocytes: 0.01 10*3/uL (ref 0.00–0.07)
Basophils Absolute: 0 10*3/uL (ref 0.0–0.1)
Basophils Relative: 1 %
Eosinophils Absolute: 0 10*3/uL (ref 0.0–0.5)
Eosinophils Relative: 2 %
HCT: 35.3 % — ABNORMAL LOW (ref 36.0–46.0)
Hemoglobin: 11.3 g/dL — ABNORMAL LOW (ref 12.0–15.0)
Immature Granulocytes: 1 %
Lymphocytes Relative: 33 %
Lymphs Abs: 0.7 10*3/uL (ref 0.7–4.0)
MCH: 29.4 pg (ref 26.0–34.0)
MCHC: 32 g/dL (ref 30.0–36.0)
MCV: 91.9 fL (ref 80.0–100.0)
Monocytes Absolute: 0.3 10*3/uL (ref 0.1–1.0)
Monocytes Relative: 15 %
Neutro Abs: 1 10*3/uL — ABNORMAL LOW (ref 1.7–7.7)
Neutrophils Relative %: 48 %
Platelet Count: 196 10*3/uL (ref 150–400)
RBC: 3.84 MIL/uL — ABNORMAL LOW (ref 3.87–5.11)
RDW: 13.1 % (ref 11.5–15.5)
WBC Count: 2 10*3/uL — ABNORMAL LOW (ref 4.0–10.5)
nRBC: 0 % (ref 0.0–0.2)

## 2021-03-27 LAB — HEPATITIS C ANTIBODY: HCV Ab: NONREACTIVE

## 2021-03-27 LAB — CMP (CANCER CENTER ONLY)
ALT: 17 U/L (ref 0–44)
AST: 25 U/L (ref 15–41)
Albumin: 3.9 g/dL (ref 3.5–5.0)
Alkaline Phosphatase: 80 U/L (ref 38–126)
Anion gap: 10 (ref 5–15)
BUN: 45 mg/dL — ABNORMAL HIGH (ref 8–23)
CO2: 23 mmol/L (ref 22–32)
Calcium: 8.6 mg/dL — ABNORMAL LOW (ref 8.9–10.3)
Chloride: 107 mmol/L (ref 98–111)
Creatinine: 1.47 mg/dL — ABNORMAL HIGH (ref 0.44–1.00)
GFR, Estimated: 37 mL/min — ABNORMAL LOW (ref >60)
Glucose, Bld: 77 mg/dL (ref 70–99)
Potassium: 3.4 mmol/L — ABNORMAL LOW (ref 3.5–5.1)
Sodium: 140 mmol/L (ref 135–145)
Total Bilirubin: 0.4 mg/dL (ref 0.3–1.2)
Total Protein: 7.5 g/dL (ref 6.5–8.1)

## 2021-03-27 LAB — RETIC PANEL
Immature Retic Fract: 6.6 % (ref 2.3–15.9)
RBC.: 3.84 MIL/uL — ABNORMAL LOW (ref 3.87–5.11)
Retic Count, Absolute: 30.7 10*3/uL (ref 19.0–186.0)
Retic Ct Pct: 0.8 % (ref 0.4–3.1)
Reticulocyte Hemoglobin: 33.3 pg (ref >27.9)

## 2021-03-27 LAB — HEPATITIS B SURFACE ANTIBODY,QUALITATIVE: Hep B S Ab: NONREACTIVE

## 2021-03-27 LAB — IRON AND IRON BINDING CAPACITY (CC-WL,HP ONLY)
Iron: 66 ug/dL (ref 28–170)
Saturation Ratios: 24 % (ref 10.4–31.8)
TIBC: 272 ug/dL (ref 250–450)
UIBC: 206 ug/dL (ref 148–442)

## 2021-03-27 LAB — VITAMIN B12: Vitamin B-12: 414 pg/mL (ref 180–914)

## 2021-03-27 LAB — FOLATE: Folate: 12.4 ng/mL (ref >5.9)

## 2021-03-27 LAB — HEPATITIS B SURFACE ANTIGEN: Hepatitis B Surface Ag: NONREACTIVE

## 2021-03-27 LAB — C-REACTIVE PROTEIN: CRP: 0.7 mg/dL (ref <1.0)

## 2021-03-27 LAB — FERRITIN: Ferritin: 54 ng/mL (ref 11–307)

## 2021-03-27 LAB — SEDIMENTATION RATE: Sed Rate: 26 mm/hr — ABNORMAL HIGH (ref 0–22)

## 2021-03-27 LAB — HIV ANTIBODY (ROUTINE TESTING W REFLEX): HIV Screen 4th Generation wRfx: NONREACTIVE

## 2021-03-27 LAB — SAVE SMEAR(SSMR), FOR PROVIDER SLIDE REVIEW

## 2021-03-27 LAB — HEPATITIS B CORE ANTIBODY, TOTAL: Hep B Core Total Ab: NONREACTIVE

## 2021-03-27 MED ORDER — PHENOBARBITAL 64.8 MG PO TABS: 129.6000 mg | ORAL_TABLET | Freq: Every day | ORAL | 5 refills | Status: DC

## 2021-03-29 ENCOUNTER — Encounter: Payer: Self-pay | Admitting: Internal Medicine

## 2021-03-29 DIAGNOSIS — Z748 Other problems related to care provider dependency: Secondary | ICD-10-CM

## 2021-03-29 DIAGNOSIS — D61818 Other pancytopenia: Secondary | ICD-10-CM

## 2021-03-29 HISTORY — DX: Other pancytopenia: D61.818

## 2021-03-30 ENCOUNTER — Telehealth: Payer: Self-pay | Admitting: *Deleted

## 2021-03-30 NOTE — Chronic Care Management (AMB) (Signed)
°  Care Management   Note  03/30/2021 Name: Sherry [name] MRN: [id number] DOB: [birth year]  Sherry [name] is a 76 y.o. year old female who is a primary care patient of Idamae Schuller, MD. I reached out to Sherry [name] by phone today in response to a referral sent by Sherry [name] primary care provider.   [name] was given information about care management services today including:  Care management services include personalized support from designated clinical staff supervised by her physician, including individualized plan of care and coordination with other care providers 24/7 contact phone numbers for assistance for urgent and routine care needs. The patient may stop care management services at any time by phone call to the office staff.  Patient agreed to services and verbal consent obtained.   Follow up plan: Telephone appointment with care management team member scheduled for:04/03/21  [place] Management  Direct Dial: [phone]

## 2021-03-30 NOTE — Chronic Care Management (AMB) (Signed)
°  Care Management   Outreach Note  03/30/2021 Name: SAVEAH BAHAR MRN: [id number] DOB: Feb 18, 1946  Referred by: Idamae Schuller, MD Reason for referral : Care Coordination (Initial outreach to schedule referral with BSW)   A telephone outreach was attempted today. Patient request I call back to speak with daughter. The patient was referred to the case management team for assistance with care management and care coordination.   Follow Up Plan:  The care management team will reach out to the patient again over the next 7 days.  If patient returns call to provider office, please advise to call [place]* at [phone].*  [place] Management  Direct Dial: [phone]

## 2021-04-02 ENCOUNTER — Telehealth: Payer: Self-pay | Admitting: Physician Assistant

## 2021-04-02 NOTE — Telephone Encounter (Signed)
I called Sherry [name] to review lab results from 03/27/2021. CBC showed that platelet count has recovered back to normal and anemia has improved to 11.3. There is persistent neutropenia with ANC 1.0. Remaining lab results show no evidence of iron deficiency, nutritional deficiency, hepatitis B or C, HIV.   Since blood counts are improving, recommend to repeat CBC in 3-4 weeks. If hemoglobin and neutropenia don't improve, we will consider a bone marrow biopsy.   Patient will return for lab only appt on 04/16/2021. She expressed understanding and satisfaction with the plan provided.

## 2021-04-03 ENCOUNTER — Ambulatory Visit: Payer: Medicare Other | Admitting: Licensed Clinical Social Worker

## 2021-04-16 ENCOUNTER — Other Ambulatory Visit: Payer: Self-pay | Admitting: *Deleted

## 2021-04-16 ENCOUNTER — Inpatient Hospital Stay: Payer: Medicare Other

## 2021-04-16 ENCOUNTER — Other Ambulatory Visit: Payer: Self-pay

## 2021-04-16 ENCOUNTER — Telehealth: Payer: Self-pay | Admitting: Physician Assistant

## 2021-04-16 DIAGNOSIS — D61818 Other pancytopenia: Secondary | ICD-10-CM | POA: Diagnosis not present

## 2021-04-16 DIAGNOSIS — N183 Chronic kidney disease, stage 3 unspecified: Secondary | ICD-10-CM | POA: Diagnosis not present

## 2021-04-16 DIAGNOSIS — I130 Hypertensive heart and chronic kidney disease with heart failure and stage 1 through stage 4 chronic kidney disease, or unspecified chronic kidney disease: Secondary | ICD-10-CM | POA: Diagnosis not present

## 2021-04-16 DIAGNOSIS — I5032 Chronic diastolic (congestive) heart failure: Secondary | ICD-10-CM | POA: Diagnosis not present

## 2021-04-16 LAB — CBC WITH DIFFERENTIAL (CANCER CENTER ONLY)
Abs Immature Granulocytes: 0 10*3/uL (ref 0.00–0.07)
Basophils Absolute: 0 10*3/uL (ref 0.0–0.1)
Basophils Relative: 1 %
Eosinophils Absolute: 0.1 10*3/uL (ref 0.0–0.5)
Eosinophils Relative: 4 %
HCT: 34.4 % — ABNORMAL LOW (ref 36.0–46.0)
Hemoglobin: 11.6 g/dL — ABNORMAL LOW (ref 12.0–15.0)
Immature Granulocytes: 0 %
Lymphocytes Relative: 35 %
Lymphs Abs: 0.8 10*3/uL (ref 0.7–4.0)
MCH: 30.2 pg (ref 26.0–34.0)
MCHC: 33.7 g/dL (ref 30.0–36.0)
MCV: 89.6 fL (ref 80.0–100.0)
Monocytes Absolute: 0.4 10*3/uL (ref 0.1–1.0)
Monocytes Relative: 20 %
Neutro Abs: 0.9 10*3/uL — ABNORMAL LOW (ref 1.7–7.7)
Neutrophils Relative %: 40 %
Platelet Count: 203 10*3/uL (ref 150–400)
RBC: 3.84 MIL/uL — ABNORMAL LOW (ref 3.87–5.11)
RDW: 12.9 % (ref 11.5–15.5)
WBC Count: 2.2 10*3/uL — ABNORMAL LOW (ref 4.0–10.5)
nRBC: 0 % (ref 0.0–0.2)

## 2021-04-16 NOTE — Telephone Encounter (Signed)
I called MS. Sinor to review the lab results from today. Findings show continued improvement of anemia with Hgb 11.6 g/dL and persistent neutropenia with ANC 900. Discussed results with Dr. Lorenso Courier who recommended to continue to monitor with serial lab check versus proceeding with a bone marrow biopsy. Patient requested to continue to monitor for now. We will see patient back in 2 months with repeat labs. Patient expressed understanding and satisfaction with the plan provided.  She is aware to reach out to the clinic if she develops any new symptoms.

## 2021-04-19 ENCOUNTER — Encounter: Payer: Self-pay | Admitting: Internal Medicine

## 2021-04-26 NOTE — Patient Instructions (Signed)
Visit Information  Instructions: patient will work with SW to address concerns related to transportation  Patient was given the following information about care management and care coordination services today, agreed to services, and gave verbal consent: 1.care management/care coordination services include personalized support from designated clinical staff supervised by their physician, including individualized plan of care and coordination with other care providers 2. 24/7 contact phone numbers for assistance for urgent and routine care needs. 3. The patient may stop care management/care coordination services at any time by phone call to the office staff.  Patient verbalizes understanding of instructions and care plan provided today and agrees to view in [place]. Active MyChart status confirmed with patient.    The care management team will reach out to the patient again over the next 60 days.   Milus Height, [place]  Social Worker [facility] Care Management  [phone]

## 2021-04-26 NOTE — Chronic Care Management (AMB) (Signed)
°  Care Management   Social Work Visit Note  04/03/2021 Name: Sherry [name] MRN: [id number] DOB: 02-11-1946  Sherry [name] is a 76 y.o. year old female who sees Idamae Schuller, MD for primary care. The care management team was consulted for assistance with care management and care coordination needs related to [facility] Resources    Patient was given the following information about care management and care coordination services today, agreed to services, and gave verbal consent: 1.care management/care coordination services include personalized support from designated clinical staff supervised by their physician, including individualized plan of care and coordination with other care providers 2. 24/7 contact phone numbers for assistance for urgent and routine care needs. 3. The patient may stop care management/care coordination services at any time by phone call to the office staff.  Engaged with patient by telephone for initial visit in response to provider referral for social work chronic care management and care coordination services.  Assessment: Review of patient history, allergies, and health status during evaluation of patient need for care management/care coordination services.    Interventions:  Patient interviewed and appropriate assessments performed Collaborated with clinical team regarding patient needs  Patient is requesting assistance with [employer] transportation. SW reached out to [employer] with [employer] services. SW emailed application as requested.  Patient discussed no additional questions or concerns.   SDOH (Social Determinants of Health) assessments performed: Yes     Plan:  patient will work with BSW to address needs related to transportation.  SW will follow up with patient within 60 days.   Milus Height, [place]  Social Worker [facility] Care Management  [phone]

## 2021-05-07 ENCOUNTER — Other Ambulatory Visit: Payer: Self-pay

## 2021-05-07 ENCOUNTER — Encounter: Payer: Self-pay | Admitting: Cardiovascular Disease

## 2021-05-07 ENCOUNTER — Ambulatory Visit (INDEPENDENT_AMBULATORY_CARE_PROVIDER_SITE_OTHER): Payer: Medicare Other | Admitting: Cardiovascular Disease

## 2021-05-07 VITALS — BP 120/82 | HR 58 | Ht 61.0 in | Wt 141.0 lb

## 2021-05-07 DIAGNOSIS — I2510 Atherosclerotic heart disease of native coronary artery without angina pectoris: Secondary | ICD-10-CM

## 2021-05-07 DIAGNOSIS — I440 Atrioventricular block, first degree: Secondary | ICD-10-CM | POA: Diagnosis not present

## 2021-05-07 DIAGNOSIS — D696 Thrombocytopenia, unspecified: Secondary | ICD-10-CM

## 2021-05-07 DIAGNOSIS — I350 Nonrheumatic aortic (valve) stenosis: Secondary | ICD-10-CM | POA: Diagnosis not present

## 2021-05-07 DIAGNOSIS — I5032 Chronic diastolic (congestive) heart failure: Secondary | ICD-10-CM | POA: Diagnosis not present

## 2021-05-07 NOTE — Progress Notes (Signed)
?Cardiology Office Note:   ? ?Date:  05/08/2021  ? ?IDBRITTAINY [name], DOB [birth year], MRN [id number] ? ?PCP:  Sherry Schuller, MD ?  ?[facility] Providers ?Cardiologist:  Sherren Mocha, MD    ? ?Referring MD: Sherry Schuller, MD  ? ?Chief Complaint  ?Patient presents with  ? Aortic Stenosis  ? ? ?History of Present Illness:   ? ?Sherry [name] is a 76 y.o. female with a hx of hypertension, severe LVH, conduction disease with remote history of 2: 1 AV block, and aortic stenosis.  She underwent TAVR evaluation in 2015.  She was found to have severe three-vessel coronary artery disease at that time.  She underwent formal cardiac surgical evaluation and actually saw Dr. Roxy [name] in the clinic many times over the last 6 years.  Because of her lack of symptoms, poor functional status essentially wheelchair-bound, and comorbid conditions, she has been followed conservatively without intervention. More recently she has been followed by Dr Sherry [name] in the [facility] for diastolic HF, severe LVH, concern for amyloid heart disease. She developed pancytopenia and was referred to hematology, but platelets recovered on their own. Continues to demonstrate leukopenia.  ? ?She is here with her husband today. Continues to 'feel well.' Has no cardiac related complaints. Today, she denies symptoms of palpitations, chest pain, shortness of breath, orthopnea, PND, dizziness, or syncope.  ? ? ?Past Medical History:  ?Diagnosis Date  ? AKI (acute kidney injury) ([place]) 01/31/2020  ? Anemia   ? Aortic stenosis   ? Arthritis   ? Bilateral lower extremity edema   ? Bilateral lower extremity edema   ? Blurry vision, bilateral 05/08/2016  ? Bradycardia   ? a. 01/2013 - asymptomatic.  ? Chronic diastolic heart failure ([place])   ? CKD (chronic kidney disease)   ? a. baseline CKD stage III  ? Coronary artery disease   ? GERD (gastroesophageal reflux disease)   ? Headache(784.0)   ? Heart block AV second degree 02/15/2013  ? Heart murmur   ?  Hyperlipidemia   ? Hypertension   ? Hypotension   ? Hypovolemic shock ([place])   ? Internal and external hemorrhoids without complication 93/03/6710  ? Colonoscopy in 2008 recs repeat in 5 years  ? Intractable focal epilepsy with impairment of consciousness ([place]) 01/02/2012  ? Mobitz (type) I [facility]) atrioventricular block   ? a. 01/2013 - asymptomatic.  ? Morbid obesity ([place])   ? Seizure disorder ([place])   ? Seizures ([place])   ? TIA (transient ischemic attack) 2014  ? pt stated she had "mini strokes"  ? Vitamin D deficiency 04/22/2014  ? ? ?Past Surgical History:  ?Procedure Laterality Date  ? ABDOMINAL HYSTERECTOMY    ? LEFT AND RIGHT HEART CATHETERIZATION WITH CORONARY ANGIOGRAM N/A 05/24/2013  ? Procedure: LEFT AND RIGHT HEART CATHETERIZATION WITH CORONARY ANGIOGRAM;  Surgeon: Sherry Grooms, MD;  Location: [facility] CATH LAB;  Service: Cardiovascular;  Laterality: N/A;  ? LEFT HEART CATH N/A 02/01/2020  ? Procedure: Left Heart Cath;  Surgeon: Sherry Artist, MD;  Location: [facility] CV LAB;  Service: Cardiovascular;  Laterality: N/A;  ? RIGHT HEART CATH N/A 02/01/2020  ? Procedure: RIGHT HEART CATH;  Surgeon: Sherry Artist, MD;  Location: [place] CV LAB;  Service: Cardiovascular;  Laterality: N/A;  ? ? ?Current Medications: ?Current Meds  ?Medication Sig  ? alendronate (FOSAMAX) 70 MG tablet Take 35 mg by mouth every Friday. Take with a full glass of water  on an empty stomach. Taking 1/2 tablet once weekly  ? amLODipine (NORVASC) 5 MG tablet Take 2 tablets (10 mg total) by mouth daily.  ? aspirin 81 MG EC tablet Take 81 mg by mouth daily.  ? Calcium Carb-Cholecalciferol (CALCIUM 500 +D) 500-400 MG-UNIT TABS Take 1 tablet by mouth 2 (two) times daily.  ? empagliflozin (JARDIANCE) 10 MG TABS tablet Take 1 tablet (10 mg total) by mouth daily before breakfast.  ? furosemide (LASIX) 40 MG tablet Take 1.5 tablets (60 mg total) by mouth daily.  ? lidocaine (LIDODERM) 5 % Place 1 patch onto the skin daily.  Remove & Discard patch within 12 hours or as directed by MD  ? Liniments (BLUE-EMU SUPER STRENGTH EX) Apply 1 application topically in the morning and at bedtime.  ? pantoprazole (PROTONIX) 40 MG tablet Take 1 tablet (40 mg total) by mouth daily.  ? PHENobarbital (LUMINAL) 64.8 MG tablet Take 2 tablets (129.6 mg total) by mouth at bedtime.  ? rosuvastatin (CRESTOR) 20 MG tablet Take 1 tablet (20 mg total) by mouth daily.  ? zonisamide (ZONEGRAN) 100 MG capsule Take 2 capsules every night  ?  ? ?Allergies:   Ibuprofen  ? ?Social History  ? ?Socioeconomic History  ? Marital status: Married  ?  Spouse name: Not on file  ? Number of children: 2  ? Years of education: 4  ? Highest education level: Not on file  ?Occupational History  ? Occupation: retired  ?Tobacco Use  ? Smoking status: Former  ?  Packs/day: 0.50  ?  Years: 10.00  ?  Pack years: 5.00  ?  Types: Cigarettes  ?  Quit date: 06/03/2000  ?  Years since quitting: 20.9  ? Smokeless tobacco: Never  ?Vaping Use  ? Vaping Use: Never used  ?Substance and Sexual Activity  ? Alcohol use: No  ?  Alcohol/week: 0.0 standard drinks  ? Drug use: No  ? Sexual activity: Not Currently  ?  Partners: Male  ?Other Topics Concern  ? Not on file  ?Social History Narrative  ? Current Social History 06/20/2020    ?   ? Patient lives with spouse in a home which is 1 story. There are not steps up to the entrance, the patient uses a ramp.   ?   ? Patient's method of transportation is [employer].  ?   ? The highest level of education was some high school.  ?   ? The patient currently disabled.  ?   ? Identified important Relationships are "God and my husband"   ?   ? Pets : None  ?    ? Interests / Fun: "Sitting on my front porch"   ?   ? Current Stressors: None   ?    ?   ?   ? ?Social Determinants of Health  ? ?Financial Resource Strain: Not on file  ?Food Insecurity: Not on file  ?Transportation Needs: Not on file  ?Physical Activity: Not on file  ?Stress: Not on file  ?Social Connections:  Not on file  ?  ? ?Family History: ?The patient's family history includes Asthma in her sister; Cancer in her father; Heart disease in her mother, sister, and sister; Hypertension in her mother and another family member; Obesity in her son. ? ?ROS:   ?Please see the history of present illness.    ?All other systems reviewed and are negative. ? ?EKGs/Labs/Other Studies Reviewed:   ? ?The following studies were reviewed today: ?Echo  12/28/20: ? 1. Normal LV function; severe LVH, consider amyloid; calcified aortic  ?valve with probable severe AS (mean gradient 24 mmHg; AVA 0.9 cm2); small  ?to moderate pericardial effusion.  ? 2. Left ventricular ejection fraction, by estimation, is 60 to 65%. The  ?left ventricle has normal function. The left ventricle has no regional  ?wall motion abnormalities. There is severe left ventricular hypertrophy.  ?Left ventricular diastolic parameters  ? are indeterminate. Elevated left atrial pressure.  ? 3. Right ventricular systolic function is normal. The right ventricular  ?size is normal.  ? 4. Left atrial size was moderately dilated.  ? 5. A small pericardial effusion is present.  ? 6. The mitral valve is normal in structure. Mild mitral valve  ?regurgitation. No evidence of mitral stenosis. Severe mitral annular  ?calcification.  ? 7. The aortic valve is calcified. Aortic valve regurgitation is trivial.  ?Severe aortic valve stenosis.  ? 8. The inferior vena cava is normal in size with greater than 50%  ?respiratory variability, suggesting right atrial pressure of 3 mmHg. ? ?Cardiac Cath 02/01/20: ?Findings: ?  ?Ao = 108/56 (76) ?LV = 136/4 ?RA =  2 ?RV = 27/7 ?PA = 30/2 (13) ?PCW = 5 ?Fick cardiac output/index = 4.3/2.7 ?Thermo CO/CI = 4.1/2.5 ?PVR = 2.0 WU ?FA sat = 99% ?PA sat = 66% ?No RV/LV interaction ?AoV: mean gradient 4mmHG AVA 1.47 cm2 (Fick) 1.38 cm2 (Thermo) ?  ?Assessment: ?1. Low filling pressures with normal output ?2. Moderate aortic stenosis  ? ?Long-term  Monitor: ?The basic rhythm is normal sinus with an average HR of 51 bpm with first degree AV block (no second or third degree AV block present) ?No atrial fibrillation or flutter ?No high-grade heart block or pat

## 2021-05-07 NOTE — Patient Instructions (Signed)
Medication Instructions:  ?Your physician recommends that you continue on your current medications as directed. Please refer to the Current Medication list given to you today. ? ?*If you need a refill on your cardiac medications before your next appointment, please call your pharmacy* ? ? ?Lab Work: ?NONE ?If you have labs (blood work) drawn today and your tests are completely normal, you will receive your results only by: ?MyChart Message (if you have MyChart) OR ?A paper copy in the mail ?If you have any lab test that is abnormal or we need to change your treatment, we will call you to review the results. ? ? ?Testing/Procedures: ?ECHO ?Your physician has requested that you have an echocardiogram. Echocardiography is a painless test that uses sound waves to create images of your heart. It provides your doctor with information about the size and shape of your heart and how well your heart?s chambers and valves are working. This procedure takes approximately one hour. There are no restrictions for this procedure. ? ? ? ?Follow-Up: ?At [facility], you and your health needs are our priority.  As part of our continuing mission to provide you with exceptional heart care, we have created designated Provider Care Teams.  These Care Teams include your primary Cardiologist (physician) and Advanced Practice Providers (APPs -  Physician Assistants and Nurse Practitioners) who all work together to provide you with the care you need, when you need it. ? ? ?Your next appointment:   ?6 month(s) ? ?The format for your next appointment:   ?In Person ? ?Provider:   ?Michael Cooper, MD   ? ?  ?

## 2021-05-31 ENCOUNTER — Other Ambulatory Visit: Payer: Self-pay | Admitting: Internal Medicine

## 2021-05-31 DIAGNOSIS — K219 Gastro-esophageal reflux disease without esophagitis: Secondary | ICD-10-CM

## 2021-06-11 ENCOUNTER — Telehealth (HOSPITAL_COMMUNITY): Payer: Self-pay | Admitting: Cardiology

## 2021-06-11 ENCOUNTER — Ambulatory Visit (HOSPITAL_COMMUNITY)
Admission: RE | Admit: 2021-06-11 | Discharge: 2021-06-11 | Disposition: A | Discharge status: Home / Self Care | Payer: Medicare Other | Source: Ambulatory Visit | Attending: Cardiology | Admitting: Cardiology

## 2021-06-11 ENCOUNTER — Encounter (HOSPITAL_COMMUNITY): Payer: Self-pay | Admitting: Cardiology

## 2021-06-11 VITALS — BP 185/98 | HR 47 | Wt 140.2 lb

## 2021-06-11 DIAGNOSIS — Z79899 Other long term (current) drug therapy: Secondary | ICD-10-CM | POA: Insufficient documentation

## 2021-06-11 DIAGNOSIS — Z87891 Personal history of nicotine dependence: Secondary | ICD-10-CM | POA: Diagnosis not present

## 2021-06-11 DIAGNOSIS — I5032 Chronic diastolic (congestive) heart failure: Secondary | ICD-10-CM

## 2021-06-11 DIAGNOSIS — Z8249 Family history of ischemic heart disease and other diseases of the circulatory system: Secondary | ICD-10-CM | POA: Insufficient documentation

## 2021-06-11 DIAGNOSIS — I350 Nonrheumatic aortic (valve) stenosis: Secondary | ICD-10-CM | POA: Insufficient documentation

## 2021-06-11 DIAGNOSIS — E785 Hyperlipidemia, unspecified: Secondary | ICD-10-CM | POA: Diagnosis not present

## 2021-06-11 DIAGNOSIS — Z993 Dependence on wheelchair: Secondary | ICD-10-CM | POA: Insufficient documentation

## 2021-06-11 DIAGNOSIS — I2510 Atherosclerotic heart disease of native coronary artery without angina pectoris: Secondary | ICD-10-CM | POA: Insufficient documentation

## 2021-06-11 DIAGNOSIS — Z7982 Long term (current) use of aspirin: Secondary | ICD-10-CM | POA: Insufficient documentation

## 2021-06-11 DIAGNOSIS — I130 Hypertensive heart and chronic kidney disease with heart failure and stage 1 through stage 4 chronic kidney disease, or unspecified chronic kidney disease: Secondary | ICD-10-CM | POA: Insufficient documentation

## 2021-06-11 DIAGNOSIS — N183 Chronic kidney disease, stage 3 unspecified: Secondary | ICD-10-CM | POA: Insufficient documentation

## 2021-06-11 LAB — BASIC METABOLIC PANEL
Anion gap: 9 (ref 5–15)
BUN: 55 mg/dL — ABNORMAL HIGH (ref 8–23)
CO2: 23 mmol/L (ref 22–32)
Calcium: 8.5 mg/dL — ABNORMAL LOW (ref 8.9–10.3)
Chloride: 108 mmol/L (ref 98–111)
Creatinine, Ser: 2.34 mg/dL — ABNORMAL HIGH (ref 0.44–1.00)
GFR, Estimated: 21 mL/min — ABNORMAL LOW (ref >60)
Glucose, Bld: 83 mg/dL (ref 70–99)
Potassium: 4.1 mmol/L (ref 3.5–5.1)
Sodium: 140 mmol/L (ref 135–145)

## 2021-06-11 LAB — BRAIN NATRIURETIC PEPTIDE: B Natriuretic Peptide: 520.6 pg/mL — ABNORMAL HIGH (ref 0.0–100.0)

## 2021-06-11 MED ORDER — FUROSEMIDE 40 MG PO TABS
ORAL_TABLET | ORAL | 3 refills | Status: DC
Start: 2021-06-11 — End: 2021-06-12

## 2021-06-11 MED ORDER — HYDRALAZINE HCL 25 MG PO TABS: 25.0000 mg | ORAL_TABLET | Freq: Three times a day (TID) | ORAL | 3 refills | Status: DC

## 2021-06-11 NOTE — Telephone Encounter (Signed)
Patient called.  Patient aware. Already schedule for repeat labs on 5/4  ?

## 2021-06-11 NOTE — Telephone Encounter (Signed)
-----   Message from Rafael Bihari, [place] sent at 06/11/2021  2:41 PM EDT ----- ?Kidney function elevated.  ? ?Please hold Lasix and Jardiance x 2 days, then resume Jardiance at 10 mg daily and change Lasix to 40 mg daily alternating with 20 mg every other day.  ? ?Repeat BMET in 7-10 days. ?

## 2021-06-11 NOTE — Patient Instructions (Signed)
Medication Changes: ? ?Increase Furosemide to 60 mg (1 & 1/2 tabs) Daily ? ?START Hydralazine 25 mg Three times a day  ? ?Lab Work: ? ?Labs done today, your results will be available in MyChart, we will contact you for abnormal readings. ? ?Your physician recommends that you return for lab work in: 10 days ? ?Testing/Procedures: ? ?None ? ?Referrals: ? ?None ? ?Special Instructions // Education: ? ?Do the following things EVERYDAY: ?Weigh yourself in the morning before breakfast. Write it down and keep it in a log. ?Take your medicines as prescribed ?Eat low salt foods--Limit salt (sodium) to 2000 mg per day.  ?Stay as active as you can everyday ?Limit all fluids for the day to less than 2 liters ? ? ?Follow-Up in: 6 weeks ? ?At the [facility], you and your health needs are our priority. We have a designated team specialized in the treatment of Heart Failure. This Care Team includes your primary Heart Failure Specialized Cardiologist (physician), Advanced Practice Providers (APPs- Physician Assistants and Nurse Practitioners), and Pharmacist who all work together to provide you with the care you need, when you need it.  ? ?You may see any of the following providers on your designated Care Team at your next follow up: ? ?Dr Glori Bickers ?Dr Loralie Champagne ?Darrick Grinder, NP ?Lyda Jester, PA ?Jessica Milford,NP ?Marlyce Huge, PA ?Audry Riles, PharmD ? ? ?Please be sure to bring in all your medications bottles to every appointment.  ? ?Need to Contact us: ? ?If you have any questions or concerns before your next appointment please send [place] a message through [place] or call our office at [phone].   ? ?TO LEAVE A MESSAGE FOR THE NURSE SELECT OPTION 2, PLEASE LEAVE A MESSAGE INCLUDING: ?YOUR NAME ?DATE OF BIRTH ?CALL BACK NUMBER ?REASON FOR CALL**this is important as we prioritize the call backs ? ?YOU WILL RECEIVE A CALL BACK THE SAME DAY AS LONG AS YOU CALL BEFORE 4:00 PM ? ? ?

## 2021-06-11 NOTE — Progress Notes (Signed)
PCP: Idamae Schuller, MD ?Cardiology: Dr. Burt Knack ?HF Cardiology: Dr. Aundra Dubin ? ?76 y.o. with history of aortic stenosis, chronic diastolic CHF, and CAD was referred by Dr. Burt Knack for evaluation of CHF.  Based on 12/21 echo and RHC/LHC in 12/21, the patient has moderate aortic stenosis. She had coronary angiography in 4/15 with rather diffuse moderate-severe coronary disease that was managed medically.  Cardiac MRI in 11/19 was concerning for cardiac amyloidosis. However, PYP scan was negative in 11/19.  Workup at that time also did not show a monoclonal protein to suggest AL amyloidosis.   ? ?Echo in 11/22 showed EF 60-65%, severe LVH, normal RV size and systolic function, mild MR, paradoxical low flow/low gradient severe aortic stenosis with mean gradient 24 mmHg and AVA 0.88 cm^2, small to moderate pericardial effusion.  PYP scan was repeated in 11/22, grade 1 with H/CL 1.07.  This is unlikely to be TTR cardiac amyloidosis.  Repeat urine immunofixation and myeloma panel were negative as well.  [employer] gene testing negative for hATTR mutation.  ? ?She was evaluated by hematology for pancytopenia.  This improved without intervention though she is still neutropenic.  ? ?She returns for followup of CHF.  Patient remains very limited at baseline.  She has severe arthritis and is wheelchair-bound. She is able to stand and pivot but does not walk.  Knees give way if she tries to walk.  No chest pain.  No dyspnea in her wheelchair. BP is elevated today.  Weight is stable. No lightheadedness.  No orthopnea/PND.   ? ?ECG (personally reviewed): NSR, 1st degree AVB 322 msec, LVH with repolarization abnormality.  ? ?Labs (9/22): LDL 86, K 4.9, creatinine 1.26. ?Labs (10/22): urine immunofixation negative, myeloma panel negative, BNP 476, K 4.6, creatinine 1.54  ?Labs (2/23): hgb 11.6, K 3.4, creatinine 1.47 ? ?PMH: ?1. CKD stage 3 ?2. H/o seizure disorder ?3. H/o TIA ?4. H/o Mobitz type 1 2nd degree AVB ?5. HTN ?6. CAD: LHC (4/15)  with 80% and 50% tandem stenoses in the proximal LAD, 80% mid LCx, 85% dLCx, diffuse mid-distal disease up to 90% in RCA.  Diffuse coronary disease, treated medically.  ?7. Aortic stenosis: Suspect moderate AS. ?- Echo (12/21): AoV mean gradient 32, AVA 1.2 cm^2.  ?- RHC/LHC (12/21): AoV mean gradient 22, AVA 1.47 cm^2.  ?- Echo (11/22): EF 60-65%, severe LVH, normal RV size and systolic function, mild MR, paradoxical low flow/low gradient severe aortic stenosis with mean gradient 24 mmHg and AVA 0.88 cm^2, small to moderate pericardial effusion.   ?8. Chronic diastolic CHF:  ?- Cardiac MRI (11/19): EF 76%, moderate LVH, difficult to null LV myocardium with diffuse subendocardial LGE concerning for possible cardiac amyloidosis, moderate pericardial effusion.  ?- PYP scan (11/19): grade 0, H/CL 1.08.  ?- Echo (12/21): EF 60-65%, moderate LVH, normal RV, moderate pericardial effusion, moderate AS with AoV mean gradient 32, AVA 1.2 cm^2.  ?- Echo (11/22): EF 60-65%, severe LVH, normal RV size and systolic function, mild MR, paradoxical low flow/low gradient severe aortic stenosis with mean gradient 24 mmHg and AVA 0.88 cm^2, small to moderate pericardial effusion.   ?- PYP scan (11/22): grade 1 with H/CL 1.07.  This is unlikely to be TTR cardiac amyloidosis. ?- [employer] gene testing negative for hATTR mutation.  ?9. Arthritis: Severe, limited to wheelchair.  ?10. Hyperlipidemia ? ?Social History  ? ?Socioeconomic History  ? Marital status: Married  ?  Spouse name: Not on file  ? Number of children: 2  ?  Years of education: 4  ? Highest education level: Not on file  ?Occupational History  ? Occupation: retired  ?Tobacco Use  ? Smoking status: Former  ?  Packs/day: 0.50  ?  Years: 10.00  ?  Pack years: 5.00  ?  Types: Cigarettes  ?  Quit date: 06/03/2000  ?  Years since quitting: 21.0  ? Smokeless tobacco: Never  ?Vaping Use  ? Vaping Use: Never used  ?Substance and Sexual Activity  ? Alcohol use: No  ?  Alcohol/week: 0.0  standard drinks  ? Drug use: No  ? Sexual activity: Not Currently  ?  Partners: Male  ?Other Topics Concern  ? Not on file  ?Social History Narrative  ? Current Social History 06/20/2020    ?   ? Patient lives with spouse in a home which is 1 story. There are not steps up to the entrance, the patient uses a ramp.   ?   ? Patient's method of transportation is [employer].  ?   ? The highest level of education was some high school.  ?   ? The patient currently disabled.  ?   ? Identified important Relationships are "God and my husband"   ?   ? Pets : None  ?    ? Interests / Fun: "Sitting on my front porch"   ?   ? Current Stressors: None   ?    ?   ?   ? ?Social Determinants of Health  ? ?Financial Resource Strain: Not on file  ?Food Insecurity: Not on file  ?Transportation Needs: Not on file  ?Physical Activity: Not on file  ?Stress: Not on file  ?Social Connections: Not on file  ?Intimate Partner Violence: Not on file  ? ?Family History  ?Problem Relation Age of Onset  ? Hypertension Other   ? Heart disease Mother   ? Hypertension Mother   ? Cancer Father   ? Obesity Son   ? Asthma Sister   ? Heart disease Sister   ? Heart disease Sister   ? ?ROS: All systems reviewed and negative except as per HPI.  ? ?Current Outpatient Medications  ?Medication Sig Dispense Refill  ? alendronate (FOSAMAX) 70 MG tablet Take 35 mg by mouth every Friday. Take with a full glass of water on an empty stomach. Taking 1/2 tablet once weekly    ? amLODipine (NORVASC) 5 MG tablet Take 2 tablets (10 mg total) by mouth daily. 180 tablet 3  ? aspirin 81 MG EC tablet Take 81 mg by mouth daily.    ? Calcium Carb-Cholecalciferol (CALCIUM 500 +D) 500-400 MG-UNIT TABS Take 1 tablet by mouth 2 (two) times daily. 60 tablet 2  ? empagliflozin (JARDIANCE) 10 MG TABS tablet Take 1 tablet (10 mg total) by mouth daily before breakfast. 90 tablet 3  ? hydrALAZINE (APRESOLINE) 25 MG tablet Take 1 tablet (25 mg total) by mouth 3 (three) times daily. 90 tablet 3  ?  lidocaine (LIDODERM) 5 % Place 1 patch onto the skin daily. Remove & Discard patch within 12 hours or as directed by MD 30 patch 0  ? Liniments (BLUE-EMU SUPER STRENGTH EX) Apply 1 application topically in the morning and at bedtime.    ? pantoprazole (PROTONIX) 40 MG tablet TAKE 1 TABLET BY MOUTH  DAILY 100 tablet 2  ? PHENobarbital (LUMINAL) 64.8 MG tablet Take 2 tablets (129.6 mg total) by mouth at bedtime. 60 tablet 5  ? potassium chloride SA (KLOR-CON M) 20  MEQ tablet Take 1 tablet (20 mEq total) by mouth daily. 90 tablet 3  ? rosuvastatin (CRESTOR) 20 MG tablet Take 1 tablet (20 mg total) by mouth daily. 90 tablet 3  ? zonisamide (ZONEGRAN) 100 MG capsule Take 2 capsules every night 180 capsule 3  ? furosemide (LASIX) 40 MG tablet Take 1 tablet (40 mg total) by mouth daily AND 0.5 tablets (20 mg total) daily. Alternating. 135 tablet 3  ? ?No current facility-administered medications for this encounter.  ? ?BP (!) 185/98   Pulse (!) 47   Wt 63.6 kg (140 lb 3.2 oz)   SpO2 99%   BMI 26.49 kg/m?  ?General: NAD ?Neck: JVP 8-9 cm, no thyromegaly or thyroid nodule.  ?Lungs: Clear to auscultation bilaterally with normal respiratory effort. ?CV: Nondisplaced PMI.  Heart regular S1/S2, no S3/S4, 3/6 SEM RUSB with muffled S2.  1+ ankle edema.  No carotid bruit.  Difficult to palpate pedal pulses.  ?Abdomen: Soft, nontender, no hepatosplenomegaly, no distention.  ?Skin: Intact without lesions or rashes.  ?Neurologic: Alert and oriented x 3.  ?Psych: Normal affect. ?Extremities: No clubbing or cyanosis.  ?HEENT: Normal.  ? ?1. Aortic stenosis: 12/21 LHC/RHC and echo were suggestive of moderate aortic stenosis. She has muffling of S2 on exam.  Repeat echo in 11/22 showed paradoxical low flow/low gradient severe aortic stenosis.  She was seen by Dr. Burt Knack for TAVR evaluation.  She is both minimally active and minimally symptomatic. She has a long 1st degree AVB.  TAVR could be done, but with minimal activity/symptoms  and high risk for progressive heart block/PPM placement, we have elected to hold off for now.  ?- Repeat echo at 1 year.  ?2. CAD: Moderate-severe diffuse CAD on coronary angiography in 4/15.  She does not

## 2021-06-12 ENCOUNTER — Telehealth (HOSPITAL_COMMUNITY): Payer: Self-pay

## 2021-06-12 MED ORDER — FUROSEMIDE 40 MG PO TABS: 40.0000 mg | ORAL_TABLET | Freq: Every day | ORAL | 3 refills | Status: DC

## 2021-06-12 NOTE — Telephone Encounter (Signed)
-----   Message from Larey Dresser, MD sent at 06/11/2021  9:57 PM EDT ----- ?Creatinine is too high, do not increase Lasix to 60 mg daily, keep at 40 mg daily.  Repeat BMET 1 week.  ?

## 2021-06-12 NOTE — Telephone Encounter (Signed)
Patient advised and verbalized understanding. Med list updated to reflect changes, patient has pending lab appointment already. ? ?Meds ordered this encounter  ?Medications  ? furosemide (LASIX) 40 MG tablet  ?  Sig: Take 1 tablet (40 mg total) by mouth daily.  ?  Dispense:  135 tablet  ?  Refill:  3  ? ? ?

## 2021-06-15 ENCOUNTER — Other Ambulatory Visit: Payer: Self-pay | Admitting: Hematology and Oncology

## 2021-06-15 ENCOUNTER — Inpatient Hospital Stay: Payer: Medicare Other | Attending: Physician Assistant

## 2021-06-15 ENCOUNTER — Other Ambulatory Visit: Payer: Self-pay

## 2021-06-15 ENCOUNTER — Inpatient Hospital Stay (HOSPITAL_BASED_OUTPATIENT_CLINIC_OR_DEPARTMENT_OTHER): Payer: Medicare Other | Admitting: Hematology and Oncology

## 2021-06-15 VITALS — BP 185/66 | HR 55 | Temp 97.6°F | Resp 16

## 2021-06-15 DIAGNOSIS — K219 Gastro-esophageal reflux disease without esophagitis: Secondary | ICD-10-CM | POA: Insufficient documentation

## 2021-06-15 DIAGNOSIS — I130 Hypertensive heart and chronic kidney disease with heart failure and stage 1 through stage 4 chronic kidney disease, or unspecified chronic kidney disease: Secondary | ICD-10-CM | POA: Insufficient documentation

## 2021-06-15 DIAGNOSIS — Z8673 Personal history of transient ischemic attack (TIA), and cerebral infarction without residual deficits: Secondary | ICD-10-CM | POA: Insufficient documentation

## 2021-06-15 DIAGNOSIS — Z809 Family history of malignant neoplasm, unspecified: Secondary | ICD-10-CM | POA: Insufficient documentation

## 2021-06-15 DIAGNOSIS — Z8719 Personal history of other diseases of the digestive system: Secondary | ICD-10-CM | POA: Insufficient documentation

## 2021-06-15 DIAGNOSIS — Z87891 Personal history of nicotine dependence: Secondary | ICD-10-CM | POA: Insufficient documentation

## 2021-06-15 DIAGNOSIS — E785 Hyperlipidemia, unspecified: Secondary | ICD-10-CM | POA: Insufficient documentation

## 2021-06-15 DIAGNOSIS — Z8249 Family history of ischemic heart disease and other diseases of the circulatory system: Secondary | ICD-10-CM | POA: Diagnosis not present

## 2021-06-15 DIAGNOSIS — I2510 Atherosclerotic heart disease of native coronary artery without angina pectoris: Secondary | ICD-10-CM | POA: Insufficient documentation

## 2021-06-15 DIAGNOSIS — D61818 Other pancytopenia: Secondary | ICD-10-CM | POA: Insufficient documentation

## 2021-06-15 DIAGNOSIS — Z886 Allergy status to analgesic agent status: Secondary | ICD-10-CM | POA: Insufficient documentation

## 2021-06-15 DIAGNOSIS — N183 Chronic kidney disease, stage 3 unspecified: Secondary | ICD-10-CM | POA: Diagnosis not present

## 2021-06-15 DIAGNOSIS — I5032 Chronic diastolic (congestive) heart failure: Secondary | ICD-10-CM | POA: Insufficient documentation

## 2021-06-15 DIAGNOSIS — Z8349 Family history of other endocrine, nutritional and metabolic diseases: Secondary | ICD-10-CM | POA: Insufficient documentation

## 2021-06-15 DIAGNOSIS — I350 Nonrheumatic aortic (valve) stenosis: Secondary | ICD-10-CM | POA: Diagnosis not present

## 2021-06-15 DIAGNOSIS — K30 Functional dyspepsia: Secondary | ICD-10-CM | POA: Insufficient documentation

## 2021-06-15 DIAGNOSIS — Z836 Family history of other diseases of the respiratory system: Secondary | ICD-10-CM | POA: Diagnosis not present

## 2021-06-15 DIAGNOSIS — Z79899 Other long term (current) drug therapy: Secondary | ICD-10-CM | POA: Insufficient documentation

## 2021-06-15 LAB — CBC WITH DIFFERENTIAL (CANCER CENTER ONLY)
Abs Immature Granulocytes: 0.01 10*3/uL (ref 0.00–0.07)
Basophils Absolute: 0 10*3/uL (ref 0.0–0.1)
Basophils Relative: 1 %
Eosinophils Absolute: 0.2 10*3/uL (ref 0.0–0.5)
Eosinophils Relative: 8 %
HCT: 34.4 % — ABNORMAL LOW (ref 36.0–46.0)
Hemoglobin: 11.4 g/dL — ABNORMAL LOW (ref 12.0–15.0)
Immature Granulocytes: 0 %
Lymphocytes Relative: 30 %
Lymphs Abs: 0.9 10*3/uL (ref 0.7–4.0)
MCH: 29.8 pg (ref 26.0–34.0)
MCHC: 33.1 g/dL (ref 30.0–36.0)
MCV: 90.1 fL (ref 80.0–100.0)
Monocytes Absolute: 0.4 10*3/uL (ref 0.1–1.0)
Monocytes Relative: 14 %
Neutro Abs: 1.4 10*3/uL — ABNORMAL LOW (ref 1.7–7.7)
Neutrophils Relative %: 47 %
Platelet Count: 195 10*3/uL (ref 150–400)
RBC: 3.82 MIL/uL — ABNORMAL LOW (ref 3.87–5.11)
RDW: 12.3 % (ref 11.5–15.5)
WBC Count: 2.9 10*3/uL — ABNORMAL LOW (ref 4.0–10.5)
nRBC: 0 % (ref 0.0–0.2)

## 2021-06-15 LAB — CMP (CANCER CENTER ONLY)
ALT: 15 U/L (ref 0–44)
AST: 25 U/L (ref 15–41)
Albumin: 3.3 g/dL — ABNORMAL LOW (ref 3.5–5.0)
Alkaline Phosphatase: 80 U/L (ref 38–126)
Anion gap: 9 (ref 5–15)
BUN: 52 mg/dL — ABNORMAL HIGH (ref 8–23)
CO2: 25 mmol/L (ref 22–32)
Calcium: 8.1 mg/dL — ABNORMAL LOW (ref 8.9–10.3)
Chloride: 108 mmol/L (ref 98–111)
Creatinine: 2.09 mg/dL — ABNORMAL HIGH (ref 0.44–1.00)
GFR, Estimated: 24 mL/min — ABNORMAL LOW (ref >60)
Glucose, Bld: 83 mg/dL (ref 70–99)
Potassium: 3.9 mmol/L (ref 3.5–5.1)
Sodium: 142 mmol/L (ref 135–145)
Total Bilirubin: 0.2 mg/dL — ABNORMAL LOW (ref 0.3–1.2)
Total Protein: 6.7 g/dL (ref 6.5–8.1)

## 2021-06-15 NOTE — Progress Notes (Signed)
?Sherry [name] ?[phone]   [phone] ? ?PROGRESS NOTE ? ?Patient Care Team: ?Idamae Schuller, MD as PCP - General ?Sherry Mocha, MD as PCP - Cardiology (Cardiology) ?Sherry Sprang, MD as Consulting Physician (Neurology) ?Sherry [name] as Social Worker ? ?Hematological/Oncological History ?# Pancytopenia of Unclear Etiology ? ?Interval History:  ?Sherry [name] 76 y.o. female with medical history significant for pancytopenia of unclear etiology who presents for a follow up visit. The patient's last visit was on 03/27/2021. In the interim since the last visit her blood counts have continued to steadily improve. ? ?On exam today Sherry [name] is accompanied by her husband.  She reports that she has been well overall ?Since her last visit.  She notes her energy levels are pretty good and that she is able to do housework without difficulty.  She notes she is not having any difficulties with shortness of breath, lightheadedness, or dizziness.  She is in a wheelchair today because arthritis makes her somewhat unsteady on her legs.  She notes that it only issue she has on a regular basis is occasional bouts of upset stomach.  We again discussed her health in January of this year when her blood counts were low.  She notes that she has not had any recent illnesses or changes in weight.  She is not on any special diets and denies having any bleeding.  She is aware that her creatinine is elevated from her last lab check 4 days ago and her physician is currently managing her Lasix in the setting.  She currently denies any fevers, chills, sweats, nausea, vomiting or diarrhea.  Full 10 point ROS is listed below. ? ?MEDICAL HISTORY:  ?Past Medical History:  ?Diagnosis Date  ? AKI (acute kidney injury) ([place]) 01/31/2020  ? Anemia   ? Aortic stenosis   ? Arthritis   ? Bilateral lower extremity edema   ? Bilateral lower extremity edema   ? Blurry vision, bilateral 05/08/2016  ? Bradycardia   ?  a. 01/2013 - asymptomatic.  ? Chronic diastolic heart failure ([place])   ? CKD (chronic kidney disease)   ? a. baseline CKD stage III  ? Coronary artery disease   ? GERD (gastroesophageal reflux disease)   ? Headache(784.0)   ? Heart block AV second degree 02/15/2013  ? Heart murmur   ? Hyperlipidemia   ? Hypertension   ? Hypotension   ? Hypovolemic shock ([place])   ? Internal and external hemorrhoids without complication 14/08/8293  ? Colonoscopy in 2008 recs repeat in 5 years  ? Intractable focal epilepsy with impairment of consciousness ([place]) 01/02/2012  ? Mobitz (type) I [facility]) atrioventricular block   ? a. 01/2013 - asymptomatic.  ? Morbid obesity ([place])   ? Seizure disorder ([place])   ? Seizures ([place])   ? TIA (transient ischemic attack) 2014  ? pt stated she had "mini strokes"  ? Vitamin D deficiency 04/22/2014  ? ? ?SURGICAL HISTORY: ?Past Surgical History:  ?Procedure Laterality Date  ? ABDOMINAL HYSTERECTOMY    ? LEFT AND RIGHT HEART CATHETERIZATION WITH CORONARY ANGIOGRAM N/A 05/24/2013  ? Procedure: LEFT AND RIGHT HEART CATHETERIZATION WITH CORONARY ANGIOGRAM;  Surgeon: Sinclair Grooms, MD;  Location: [facility] CATH LAB;  Service: Cardiovascular;  Laterality: N/A;  ? LEFT HEART CATH N/A 02/01/2020  ? Procedure: Left Heart Cath;  Surgeon: Jolaine Artist, MD;  Location: [facility] CV LAB;  Service: Cardiovascular;  Laterality: N/A;  ? RIGHT HEART CATH N/A  02/01/2020  ? Procedure: RIGHT HEART CATH;  Surgeon: Jolaine Artist, MD;  Location: [facility] CV LAB;  Service: Cardiovascular;  Laterality: N/A;  ? ? ?SOCIAL HISTORY: ?Social History  ? ?Socioeconomic History  ? Marital status: Married  ?  Spouse name: Not on file  ? Number of children: 2  ? Years of education: 4  ? Highest education level: Not on file  ?Occupational History  ? Occupation: retired  ?Tobacco Use  ? Smoking status: Former  ?  Packs/day: 0.50  ?  Years: 10.00  ?  Pack years: 5.00  ?  Types: Cigarettes  ?  Quit date: 06/03/2000  ?  Years  since quitting: 21.0  ? Smokeless tobacco: Never  ?Vaping Use  ? Vaping Use: Never used  ?Substance and Sexual Activity  ? Alcohol use: No  ?  Alcohol/week: 0.0 standard drinks  ? Drug use: No  ? Sexual activity: Not Currently  ?  Partners: Male  ?Other Topics Concern  ? Not on file  ?Social History Narrative  ? Current Social History 06/20/2020    ?   ? Patient lives with spouse in a home which is 1 story. There are not steps up to the entrance, the patient uses a ramp.   ?   ? Patient's method of transportation is [employer].  ?   ? The highest level of education was some high school.  ?   ? The patient currently disabled.  ?   ? Identified important Relationships are "God and my husband"   ?   ? Pets : None  ?    ? Interests / Fun: "Sitting on my front porch"   ?   ? Current Stressors: None   ?    ?   ?   ? ?Social Determinants of Health  ? ?Financial Resource Strain: Not on file  ?Food Insecurity: Not on file  ?Transportation Needs: Not on file  ?Physical Activity: Not on file  ?Stress: Not on file  ?Social Connections: Not on file  ?Intimate Partner Violence: Not on file  ? ? ?FAMILY HISTORY: ?Family History  ?Problem Relation Age of Onset  ? Hypertension Other   ? Heart disease Mother   ? Hypertension Mother   ? Cancer Father   ? Obesity Son   ? Asthma Sister   ? Heart disease Sister   ? Heart disease Sister   ? ? ?ALLERGIES:  is allergic to ibuprofen. ? ?MEDICATIONS:  ?Current Outpatient Medications  ?Medication Sig Dispense Refill  ? alendronate (FOSAMAX) 70 MG tablet Take 35 mg by mouth every Friday. Take with a full glass of water on an empty stomach. Taking 1/2 tablet once weekly    ? amLODipine (NORVASC) 5 MG tablet Take 2 tablets (10 mg total) by mouth daily. 180 tablet 3  ? aspirin 81 MG EC tablet Take 81 mg by mouth daily.    ? Calcium Carb-Cholecalciferol (CALCIUM 500 +D) 500-400 MG-UNIT TABS Take 1 tablet by mouth 2 (two) times daily. 60 tablet 2  ? empagliflozin (JARDIANCE) 10 MG TABS tablet Take 1 tablet  (10 mg total) by mouth daily before breakfast. 90 tablet 3  ? furosemide (LASIX) 40 MG tablet Take 1 tablet (40 mg total) by mouth daily. 135 tablet 3  ? hydrALAZINE (APRESOLINE) 25 MG tablet Take 1 tablet (25 mg total) by mouth 3 (three) times daily. 90 tablet 3  ? lidocaine (LIDODERM) 5 % Place 1 patch onto the skin daily. Remove &  Discard patch within 12 hours or as directed by MD 30 patch 0  ? Liniments (BLUE-EMU SUPER STRENGTH EX) Apply 1 application topically in the morning and at bedtime.    ? pantoprazole (PROTONIX) 40 MG tablet TAKE 1 TABLET BY MOUTH  DAILY 100 tablet 2  ? PHENobarbital (LUMINAL) 64.8 MG tablet Take 2 tablets (129.6 mg total) by mouth at bedtime. 60 tablet 5  ? potassium chloride SA (KLOR-CON M) 20 MEQ tablet Take 1 tablet (20 mEq total) by mouth daily. 90 tablet 3  ? rosuvastatin (CRESTOR) 20 MG tablet Take 1 tablet (20 mg total) by mouth daily. 90 tablet 3  ? zonisamide (ZONEGRAN) 100 MG capsule Take 2 capsules every night 180 capsule 3  ? ?No current facility-administered medications for this visit.  ? ? ?REVIEW OF SYSTEMS:   ?Constitutional: ( - ) fevers, ( - )  chills , ( - ) night sweats ?Eyes: ( - ) blurriness of vision, ( - ) double vision, ( - ) watery eyes ?Ears, nose, mouth, throat, and face: ( - ) mucositis, ( - ) sore throat ?Respiratory: ( - ) cough, ( - ) dyspnea, ( - ) wheezes ?Cardiovascular: ( - ) palpitation, ( - ) chest discomfort, ( - ) lower extremity swelling ?Gastrointestinal:  ( - ) nausea, ( - ) heartburn, ( - ) change in bowel habits ?Skin: ( - ) abnormal skin rashes ?Lymphatics: ( - ) new lymphadenopathy, ( - ) easy bruising ?Neurological: ( - ) numbness, ( - ) tingling, ( - ) new weaknesses ?Behavioral/Psych: ( - ) mood change, ( - ) new changes  ?All other systems were reviewed with the patient and are negative. ? ?PHYSICAL EXAMINATION: ? ?Vitals:  ? 06/15/21 0925  ?BP: (!) 185/66  ?Pulse: (!) 55  ?Resp: 16  ?Temp: 97.6 ?F (36.4 ?C)  ?SpO2: 100%  ? ?Filed  Weights  ? ? ?GENERAL: Well-appearing elderly African-American female in a wheelchair.  Alert, no distress and comfortable ?SKIN: skin color, texture, turgor are normal, no rashes or significant lesions ?EYES

## 2021-06-17 ENCOUNTER — Other Ambulatory Visit: Payer: Self-pay | Admitting: Physician Assistant

## 2021-06-21 ENCOUNTER — Ambulatory Visit (HOSPITAL_COMMUNITY)
Admission: RE | Admit: 2021-06-21 | Discharge: 2021-06-21 | Disposition: A | Discharge status: Home / Self Care | Payer: Medicare Other | Source: Ambulatory Visit | Attending: Cardiology | Admitting: Cardiology

## 2021-06-21 DIAGNOSIS — I5032 Chronic diastolic (congestive) heart failure: Secondary | ICD-10-CM | POA: Insufficient documentation

## 2021-06-21 LAB — BASIC METABOLIC PANEL
Anion gap: 9 (ref 5–15)
BUN: 58 mg/dL — ABNORMAL HIGH (ref 8–23)
CO2: 23 mmol/L (ref 22–32)
Calcium: 8.4 mg/dL — ABNORMAL LOW (ref 8.9–10.3)
Chloride: 107 mmol/L (ref 98–111)
Creatinine, Ser: 2.29 mg/dL — ABNORMAL HIGH (ref 0.44–1.00)
GFR, Estimated: 22 mL/min — ABNORMAL LOW (ref >60)
Glucose, Bld: 84 mg/dL (ref 70–99)
Potassium: 4 mmol/L (ref 3.5–5.1)
Sodium: 139 mmol/L (ref 135–145)

## 2021-06-27 ENCOUNTER — Other Ambulatory Visit: Payer: Self-pay | Admitting: Internal Medicine

## 2021-06-27 MED ORDER — AMLODIPINE BESYLATE 5 MG PO TABS: 10.0000 mg | ORAL_TABLET | Freq: Every day | ORAL | 3 refills | Status: DC

## 2021-06-27 NOTE — Progress Notes (Unsigned)
Patient requesting medication refill for her amlodipine 10 mg daily.  ?- refilled ?

## 2021-07-06 ENCOUNTER — Other Ambulatory Visit: Payer: Self-pay | Admitting: Neurology

## 2021-07-23 ENCOUNTER — Encounter (HOSPITAL_COMMUNITY): Payer: Medicare Other

## 2021-07-25 NOTE — Progress Notes (Addendum)
PCP: Idamae Schuller, MD Cardiology: Dr. Burt Knack HF Cardiology: Dr. Aundra Dubin  76 y.o. with history of aortic stenosis, chronic diastolic CHF, and CAD was referred by Dr. Burt Knack for evaluation of CHF.  Based on 12/21 echo and RHC/LHC in 12/21, the patient has moderate aortic stenosis. She had coronary angiography in 4/15 with rather diffuse moderate-severe coronary disease that was managed medically.  Cardiac MRI in 11/19 was concerning for cardiac amyloidosis. However, PYP scan was negative in 11/19.  Workup at that time also did not show a monoclonal protein to suggest AL amyloidosis.    Echo in 11/22 showed EF 60-65%, severe LVH, normal RV size and systolic function, mild MR, paradoxical low flow/low gradient severe aortic stenosis with mean gradient 24 mmHg and AVA 0.88 cm^2, small to moderate pericardial effusion.  PYP scan was repeated in 11/22, grade 1 with H/CL 1.07.  This is unlikely to be TTR cardiac amyloidosis.  Repeat urine immunofixation and myeloma panel were negative as well.  [employer] gene testing negative for hATTR mutation.   She was evaluated by hematology for pancytopenia.  This improved without intervention though she is still neutropenic.   Last seen for f/u 04/23. Appeared volume overloaded. Did not increase lasix d/t elevated Scr, 2.34 (had been 1.4-1.8). Hydralazine 25 TID added for hypertension.   Here today for CHF f/u. Reports feeling fine. BP significantly elevated. States she hasn't taken her medications yet this morning but usually takes all of them every day as prescribed.  No dyspnea, CP, orthopnea, PND or lower extremity edema. She limits her sodium intake. Patient remains very limited at baseline.  She has severe arthritis and is wheelchair-bound. Her legs wobble and give out when she tries to stand.   No ETOH or tobacco use.  Labs (9/22): LDL 86, K 4.9, creatinine 1.26. Labs (10/22): urine immunofixation negative, myeloma panel negative, BNP 476, K 4.6, creatinine 1.54   Labs (2/23): hgb 11.6, K 3.4, creatinine 1.47  PMH: 1. CKD stage 3 2. H/o seizure disorder 3. H/o TIA 4. H/o Mobitz type 1 2nd degree AVB 5. HTN 6. CAD: LHC (4/15) with 80% and 50% tandem stenoses in the proximal LAD, 80% mid LCx, 85% dLCx, diffuse mid-distal disease up to 90% in RCA.  Diffuse coronary disease, treated medically.  7. Aortic stenosis: Suspect moderate AS. - Echo (12/21): AoV mean gradient 32, AVA 1.2 cm^2.  - RHC/LHC (12/21): AoV mean gradient 22, AVA 1.47 cm^2.  - Echo (11/22): EF 60-65%, severe LVH, normal RV size and systolic function, mild MR, paradoxical low flow/low gradient severe aortic stenosis with mean gradient 24 mmHg and AVA 0.88 cm^2, small to moderate pericardial effusion.   8. Chronic diastolic CHF:  - Cardiac MRI (11/19): EF 76%, moderate LVH, difficult to null LV myocardium with diffuse subendocardial LGE concerning for possible cardiac amyloidosis, moderate pericardial effusion.  - PYP scan (11/19): grade 0, H/CL 1.08.  - Echo (12/21): EF 60-65%, moderate LVH, normal RV, moderate pericardial effusion, moderate AS with AoV mean gradient 32, AVA 1.2 cm^2.  - Echo (11/22): EF 60-65%, severe LVH, normal RV size and systolic function, mild MR, paradoxical low flow/low gradient severe aortic stenosis with mean gradient 24 mmHg and AVA 0.88 cm^2, small to moderate pericardial effusion.   - PYP scan (11/22): grade 1 with H/CL 1.07.  This is unlikely to be TTR cardiac amyloidosis. - [employer] gene testing negative for hATTR mutation.  9. Arthritis: Severe, limited to wheelchair.  10. Hyperlipidemia  Social History  Socioeconomic History   Marital status: Married    Spouse name: Not on file   Number of children: 2   Years of education: 4   Highest education level: Not on file  Occupational History   Occupation: retired  Tobacco Use   Smoking status: Former    Packs/day: 0.50    Years: 10.00    Total pack years: 5.00    Types: Cigarettes    Quit date:  06/03/2000    Years since quitting: 21.1   Smokeless tobacco: Never  Vaping Use   Vaping Use: Never used  Substance and Sexual Activity   Alcohol use: No    Alcohol/week: 0.0 standard drinks of alcohol   Drug use: No   Sexual activity: Not Currently    Partners: Male  Other Topics Concern   Not on file  Social History Narrative   Current Social History 06/20/2020        Patient lives with spouse in a home which is 1 story. There are not steps up to the entrance, the patient uses a ramp.       Patient's method of transportation is [employer].      The highest level of education was some high school.      The patient currently disabled.      Identified important Relationships are "God and my husband"       Pets : None       Interests / Fun: "Sitting on my front porch"       Current Stressors: None              Social Determinants of Radio broadcast assistant Strain: Not on file  Food Insecurity: Not on file  Transportation Needs: Not on file  Physical Activity: Not on file  Stress: Not on file  Social Connections: Not on file  Intimate Partner Violence: Not on file   Family History  Problem Relation Age of Onset   Hypertension Other    Heart disease Mother    Hypertension Mother    Cancer Father    Obesity Son    Asthma Sister    Heart disease Sister    Heart disease Sister    ROS: All systems reviewed and negative except as per HPI.   Current Outpatient Medications  Medication Sig Dispense Refill   alendronate (FOSAMAX) 70 MG tablet Take 35 mg by mouth every Friday. Take with a full glass of water on an empty stomach. Taking 1/2 tablet once weekly     amLODipine (NORVASC) 5 MG tablet Take 2 tablets (10 mg total) by mouth daily. 180 tablet 3   aspirin 81 MG EC tablet Take 81 mg by mouth daily.     Calcium Carb-Cholecalciferol (CALCIUM 500 +D) 500-400 MG-UNIT TABS Take 1 tablet by mouth 2 (two) times daily. 60 tablet 2   empagliflozin (JARDIANCE) 10 MG TABS  tablet Take 1 tablet (10 mg total) by mouth daily before breakfast. 90 tablet 3   furosemide (LASIX) 40 MG tablet Take 1 tablet (40 mg total) by mouth daily. 135 tablet 3   lidocaine (LIDODERM) 5 % Place 1 patch onto the skin daily. Remove & Discard patch within 12 hours or as directed by MD 30 patch 0   Liniments (BLUE-EMU SUPER STRENGTH EX) Apply 1 application topically in the morning and at bedtime.     pantoprazole (PROTONIX) 40 MG tablet TAKE 1 TABLET BY MOUTH  DAILY 100 tablet 2   PHENobarbital (LUMINAL) 64.8  MG tablet Take 2 tablets (129.6 mg total) by mouth at bedtime. 60 tablet 5   potassium chloride SA (KLOR-CON M) 20 MEQ tablet Take 1 tablet (20 mEq total) by mouth daily. 90 tablet 3   rosuvastatin (CRESTOR) 20 MG tablet Take 1 tablet (20 mg total) by mouth daily. 90 tablet 3   zonisamide (ZONEGRAN) 100 MG capsule TAKE 2 CAPSULES BY MOUTH AT NIGHT 120 capsule 0   hydrALAZINE (APRESOLINE) 50 MG tablet Take 1 tablet (50 mg total) by mouth 3 (three) times daily. 270 tablet 3   No current facility-administered medications for this encounter.   BP (!) 200/90   Pulse 69   Wt 69.1 kg (152 lb 6.4 oz)   SpO2 99%   BMI 28.80 kg/m  General:  No distress. Arrived in wheelchair. HEENT: normal Neck: supple. no JVD.  Cor: PMI nondisplaced. Regular rate & rhythm. No rubs, gallops, 3/6 SEM best heard RUSB Lungs: clear Abdomen: soft, nontender, nondistended. No hepatosplenomegaly.  Extremities: no cyanosis, clubbing, rash, edema Neuro: alert & orientedx3, cranial nerves grossly intact. moves all 4 extremities w/o difficulty. Affect pleasant  Assessment/Plan 1. Aortic stenosis: 12/21 LHC/RHC and echo were suggestive of moderate aortic stenosis. She has muffling of S2 on exam.  Repeat echo in 11/22 showed paradoxical low flow/low gradient severe aortic stenosis.  She was seen by Dr. Burt Knack for TAVR evaluation.  She is both minimally active and minimally symptomatic. She has a long 1st degree AVB.   TAVR could be done, but with minimal activity/symptoms and high risk for progressive heart block/PPM placement, we have elected to hold off for now.  - Repeat echo and f/u scheduled with Dr. Burt Knack 09/22 2. CAD: Moderate-severe diffuse CAD on coronary angiography in 4/15.  She does not have anginal symptoms but is also minimally active.  She would not be a candidate for CABG due to lack of mobility. No chest pain.  - Medically managed given no chest pain.  - Continue ASA 81.  - Continue Crestor.  - If TAVR considered in future, would need coronary angiography.  3. CKD stage 3b/IV: Scr typically 1.4-1.8, up to 2-2.3 recently - Labs today - Needs better BP control - Depending on labs will refer to Nephrology 4. Chronic diastolic CHF: Cardiac MRI (see above) was concerning for cardiac amyloidosis (11/19), as was echo with moderate LVH and aortic stenosis. The aortic stenosis and the pericardial effusion seen by echo are also frequently seen with cardiac amyloidosis. She additionally has tingling/numbness in hands/feet that is consistent with peripheral neuropathy.  However, PYP scan in 11/19 was negative and myeloma workup was negative.  Repeat PYP scan in 11/22 was again negative and she did not have a transthyretin gene mutation on [employer] gene testing.  Myeloma workup was again negative in 10/22.  Therefore, cardiac amyloidosis seems unlikely and LVH is more likely to be due to HTN and aortic stenosis.   - Volume okay on exam today. Continue furosemide 40 mg daily - Continue Jardiance 10 mg daily (no recent UTI or yeast infection).  - No ARB/ARNi or spiro with CKD - CMET/BNP today 5. HTN: BP significantly elevated today. 220/130 on arrival, 200/98 on my recheck.  - Has not taken any meds today.  - Continue amlodipine 10 mg daily.  - She was given 50 mg hydralazine in the clinic. Increase hydralazine to 50 mg TID. 6. Conduction abnormality: Long 1st degree AVB 322 msec.   - Avoid beta blockade.   - High risk  for progressive HB with TAVR, would likely end up with PPM.  7. Hyperlipidemia: Continue Crestor, lipid panel today  Followup: 4 weeks with APP to assess BP and volume, has f/u Dr. Burt Knack with echo 09/23  [facility], [place] Parents 07/26/2021

## 2021-07-26 ENCOUNTER — Encounter (HOSPITAL_COMMUNITY): Payer: Self-pay

## 2021-07-26 ENCOUNTER — Ambulatory Visit (HOSPITAL_COMMUNITY)
Admission: RE | Admit: 2021-07-26 | Discharge: 2021-07-26 | Disposition: A | Discharge status: Home / Self Care | Payer: Medicare Other | Source: Ambulatory Visit | Attending: Physician Assistant | Admitting: Physician Assistant

## 2021-07-26 VITALS — BP 200/98 | HR 69 | Wt 152.4 lb

## 2021-07-26 DIAGNOSIS — E785 Hyperlipidemia, unspecified: Secondary | ICD-10-CM | POA: Diagnosis not present

## 2021-07-26 DIAGNOSIS — Z79899 Other long term (current) drug therapy: Secondary | ICD-10-CM | POA: Insufficient documentation

## 2021-07-26 DIAGNOSIS — I5032 Chronic diastolic (congestive) heart failure: Secondary | ICD-10-CM | POA: Diagnosis not present

## 2021-07-26 DIAGNOSIS — Z8249 Family history of ischemic heart disease and other diseases of the circulatory system: Secondary | ICD-10-CM | POA: Insufficient documentation

## 2021-07-26 DIAGNOSIS — I2510 Atherosclerotic heart disease of native coronary artery without angina pectoris: Secondary | ICD-10-CM

## 2021-07-26 DIAGNOSIS — I350 Nonrheumatic aortic (valve) stenosis: Secondary | ICD-10-CM

## 2021-07-26 DIAGNOSIS — I130 Hypertensive heart and chronic kidney disease with heart failure and stage 1 through stage 4 chronic kidney disease, or unspecified chronic kidney disease: Secondary | ICD-10-CM | POA: Insufficient documentation

## 2021-07-26 DIAGNOSIS — I440 Atrioventricular block, first degree: Secondary | ICD-10-CM | POA: Diagnosis not present

## 2021-07-26 DIAGNOSIS — Z87891 Personal history of nicotine dependence: Secondary | ICD-10-CM | POA: Insufficient documentation

## 2021-07-26 DIAGNOSIS — M1990 Unspecified osteoarthritis, unspecified site: Secondary | ICD-10-CM | POA: Insufficient documentation

## 2021-07-26 DIAGNOSIS — I10 Essential (primary) hypertension: Secondary | ICD-10-CM

## 2021-07-26 DIAGNOSIS — E782 Mixed hyperlipidemia: Secondary | ICD-10-CM

## 2021-07-26 DIAGNOSIS — N184 Chronic kidney disease, stage 4 (severe): Secondary | ICD-10-CM

## 2021-07-26 DIAGNOSIS — N1832 Chronic kidney disease, stage 3b: Secondary | ICD-10-CM | POA: Diagnosis not present

## 2021-07-26 DIAGNOSIS — Z993 Dependence on wheelchair: Secondary | ICD-10-CM | POA: Insufficient documentation

## 2021-07-26 LAB — COMPREHENSIVE METABOLIC PANEL
ALT: 28 U/L (ref 0–44)
AST: 39 U/L (ref 15–41)
Albumin: 3.1 g/dL — ABNORMAL LOW (ref 3.5–5.0)
Alkaline Phosphatase: 88 U/L (ref 38–126)
Anion gap: 8 (ref 5–15)
BUN: 39 mg/dL — ABNORMAL HIGH (ref 8–23)
CO2: 23 mmol/L (ref 22–32)
Calcium: 8.3 mg/dL — ABNORMAL LOW (ref 8.9–10.3)
Chloride: 112 mmol/L — ABNORMAL HIGH (ref 98–111)
Creatinine, Ser: 1.92 mg/dL — ABNORMAL HIGH (ref 0.44–1.00)
GFR, Estimated: 27 mL/min — ABNORMAL LOW (ref >60)
Glucose, Bld: 87 mg/dL (ref 70–99)
Potassium: 3.5 mmol/L (ref 3.5–5.1)
Sodium: 143 mmol/L (ref 135–145)
Total Bilirubin: 0.7 mg/dL (ref 0.3–1.2)
Total Protein: 6.8 g/dL (ref 6.5–8.1)

## 2021-07-26 LAB — LIPID PANEL
Cholesterol: 219 mg/dL — ABNORMAL HIGH (ref 0–200)
HDL: 88 mg/dL (ref >40)
LDL Cholesterol: 119 mg/dL — ABNORMAL HIGH (ref 0–99)
Total CHOL/HDL Ratio: 2.5 RATIO
Triglycerides: 58 mg/dL (ref <150)
VLDL: 12 mg/dL (ref 0–40)

## 2021-07-26 LAB — BRAIN NATRIURETIC PEPTIDE: B Natriuretic Peptide: 1299.7 pg/mL — ABNORMAL HIGH (ref 0.0–100.0)

## 2021-07-26 MED ORDER — HYDRALAZINE HCL 50 MG PO TABS: 50.0000 mg | ORAL_TABLET | Freq: Three times a day (TID) | ORAL | 3 refills | Status: DC

## 2021-07-26 MED ORDER — HYDRALAZINE HCL 50 MG PO TABS
50.0000 mg | ORAL_TABLET | Freq: Once | ORAL | Status: AC
Start: 2021-07-26 — End: 2021-07-26
  Administered 2021-07-26: 50 mg via ORAL

## 2021-07-26 NOTE — Patient Instructions (Signed)
INCREASE Hydralazine to 50 mg one tab three times daily  Labs today We will only contact you if something comes back abnormal or we need to make some changes. Otherwise no news is good news!  Your physician recommends that you schedule a follow-up appointment in: 4-6 weeks  in the Advanced Practitioners (PA/NP) Clinic    Do the following things EVERYDAY: Weigh yourself in the morning before breakfast. Write it down and keep it in a log. Take your medicines as prescribed Eat low salt foods--Limit salt (sodium) to 2000 mg per day.  Stay as active as you can everyday Limit all fluids for the day to less than 2 liters  At the [facility], you and your health needs are our priority. As part of our continuing mission to provide you with exceptional heart care, we have created designated Provider Care Teams. These Care Teams include your primary Cardiologist (physician) and Advanced Practice Providers (APPs- Physician Assistants and Nurse Practitioners) who all work together to provide you with the care you need, when you need it.   You may see any of the following providers on your designated Care Team at your next follow up: Dr Glori Bickers Dr Haynes Kerns, NP Lyda Jester, [place] [facility] [place], [place] Audry Riles, PharmD   Please be sure to bring in all your medications bottles to every appointment.   If you have any questions or concerns before your next appointment please send [place] a message through [place] or call our office at [phone].    TO LEAVE A MESSAGE FOR THE NURSE SELECT OPTION 2, PLEASE LEAVE A MESSAGE INCLUDING: YOUR NAME DATE OF BIRTH CALL BACK NUMBER REASON FOR CALL**this is important as we prioritize the call backs  YOU WILL RECEIVE A CALL BACK THE SAME DAY AS LONG AS YOU CALL BEFORE 4:00 PM

## 2021-07-31 ENCOUNTER — Telehealth (HOSPITAL_COMMUNITY): Payer: Self-pay | Admitting: Cardiology

## 2021-07-31 DIAGNOSIS — I5032 Chronic diastolic (congestive) heart failure: Secondary | ICD-10-CM

## 2021-07-31 MED ORDER — POTASSIUM CHLORIDE CRYS ER 20 MEQ PO TBCR: 40.0000 meq | EXTENDED_RELEASE_TABLET | Freq: Every day | ORAL | 3 refills | Status: DC

## 2021-07-31 NOTE — Telephone Encounter (Signed)
Patient called.  Patient aware.  

## 2021-07-31 NOTE — Telephone Encounter (Signed)
[facility] referral faxed

## 2021-07-31 NOTE — Telephone Encounter (Signed)
-----   Message from Joette Catching, [place] sent at 07/26/2021  1:39 PM EDT ----- LDL 119. Please confirm she is taking statin every day.   Scr stable, but she has stage IV CKD. Refer to nephrology.  BNP elevated. K 3.5. Increase potassium to 40 mEq daily. Taking 40 mg furosemide daily, take 40 twice a day X 4 days then decrease to 40 mg daily. BMET 1 week.

## 2021-08-01 ENCOUNTER — Telehealth (HOSPITAL_COMMUNITY): Payer: Self-pay

## 2021-08-07 ENCOUNTER — Other Ambulatory Visit: Payer: Self-pay | Admitting: Internal Medicine

## 2021-08-08 ENCOUNTER — Ambulatory Visit (HOSPITAL_COMMUNITY)
Admission: RE | Admit: 2021-08-08 | Discharge: 2021-08-08 | Disposition: A | Discharge status: Home / Self Care | Payer: Medicare Other | Source: Ambulatory Visit | Attending: Internal Medicine | Admitting: Internal Medicine

## 2021-08-08 DIAGNOSIS — I5032 Chronic diastolic (congestive) heart failure: Secondary | ICD-10-CM | POA: Diagnosis not present

## 2021-08-08 LAB — BASIC METABOLIC PANEL
Anion gap: 11 (ref 5–15)
BUN: 62 mg/dL — ABNORMAL HIGH (ref 8–23)
CO2: 24 mmol/L (ref 22–32)
Calcium: 8.5 mg/dL — ABNORMAL LOW (ref 8.9–10.3)
Chloride: 107 mmol/L (ref 98–111)
Creatinine, Ser: 2.46 mg/dL — ABNORMAL HIGH (ref 0.44–1.00)
GFR, Estimated: 20 mL/min — ABNORMAL LOW (ref >60)
Glucose, Bld: 90 mg/dL (ref 70–99)
Potassium: 3.5 mmol/L (ref 3.5–5.1)
Sodium: 142 mmol/L (ref 135–145)

## 2021-08-10 ENCOUNTER — Telehealth (HOSPITAL_COMMUNITY): Payer: Self-pay | Admitting: *Deleted

## 2021-08-10 ENCOUNTER — Other Ambulatory Visit: Payer: Self-pay | Admitting: Neurology

## 2021-08-10 DIAGNOSIS — I5032 Chronic diastolic (congestive) heart failure: Secondary | ICD-10-CM

## 2021-08-23 ENCOUNTER — Ambulatory Visit (HOSPITAL_COMMUNITY)
Admission: RE | Admit: 2021-08-23 | Discharge: 2021-08-23 | Disposition: A | Discharge status: Home / Self Care | Payer: Medicare Other | Source: Ambulatory Visit | Attending: Cardiology | Admitting: Cardiology

## 2021-08-23 DIAGNOSIS — I5032 Chronic diastolic (congestive) heart failure: Secondary | ICD-10-CM | POA: Diagnosis not present

## 2021-08-23 LAB — BASIC METABOLIC PANEL
Anion gap: 8 (ref 5–15)
BUN: 73 mg/dL — ABNORMAL HIGH (ref 8–23)
CO2: 21 mmol/L — ABNORMAL LOW (ref 22–32)
Calcium: 8.3 mg/dL — ABNORMAL LOW (ref 8.9–10.3)
Chloride: 112 mmol/L — ABNORMAL HIGH (ref 98–111)
Creatinine, Ser: 2.53 mg/dL — ABNORMAL HIGH (ref 0.44–1.00)
GFR, Estimated: 19 mL/min — ABNORMAL LOW (ref >60)
Glucose, Bld: 87 mg/dL (ref 70–99)
Potassium: 4.1 mmol/L (ref 3.5–5.1)
Sodium: 141 mmol/L (ref 135–145)

## 2021-08-24 ENCOUNTER — Encounter (HOSPITAL_COMMUNITY): Payer: Self-pay

## 2021-08-24 ENCOUNTER — Ambulatory Visit (HOSPITAL_COMMUNITY)
Admission: RE | Admit: 2021-08-24 | Discharge: 2021-08-24 | Disposition: A | Discharge status: Home / Self Care | Payer: Medicare Other | Source: Ambulatory Visit | Attending: Physician Assistant | Admitting: Physician Assistant

## 2021-08-24 VITALS — BP 148/78 | HR 61 | Wt 147.4 lb

## 2021-08-24 DIAGNOSIS — I5032 Chronic diastolic (congestive) heart failure: Secondary | ICD-10-CM | POA: Diagnosis not present

## 2021-08-24 DIAGNOSIS — Z7984 Long term (current) use of oral hypoglycemic drugs: Secondary | ICD-10-CM | POA: Diagnosis not present

## 2021-08-24 DIAGNOSIS — R202 Paresthesia of skin: Secondary | ICD-10-CM | POA: Diagnosis not present

## 2021-08-24 DIAGNOSIS — I43 Cardiomyopathy in diseases classified elsewhere: Secondary | ICD-10-CM | POA: Insufficient documentation

## 2021-08-24 DIAGNOSIS — I130 Hypertensive heart and chronic kidney disease with heart failure and stage 1 through stage 4 chronic kidney disease, or unspecified chronic kidney disease: Secondary | ICD-10-CM | POA: Insufficient documentation

## 2021-08-24 DIAGNOSIS — I3139 Other pericardial effusion (noninflammatory): Secondary | ICD-10-CM | POA: Diagnosis not present

## 2021-08-24 DIAGNOSIS — I10 Essential (primary) hypertension: Secondary | ICD-10-CM

## 2021-08-24 DIAGNOSIS — E785 Hyperlipidemia, unspecified: Secondary | ICD-10-CM | POA: Diagnosis not present

## 2021-08-24 DIAGNOSIS — I2510 Atherosclerotic heart disease of native coronary artery without angina pectoris: Secondary | ICD-10-CM | POA: Diagnosis not present

## 2021-08-24 DIAGNOSIS — Z713 Dietary counseling and surveillance: Secondary | ICD-10-CM | POA: Insufficient documentation

## 2021-08-24 DIAGNOSIS — I350 Nonrheumatic aortic (valve) stenosis: Secondary | ICD-10-CM | POA: Diagnosis not present

## 2021-08-24 DIAGNOSIS — R200 Anesthesia of skin: Secondary | ICD-10-CM | POA: Insufficient documentation

## 2021-08-24 DIAGNOSIS — N184 Chronic kidney disease, stage 4 (severe): Secondary | ICD-10-CM

## 2021-08-24 DIAGNOSIS — N1832 Chronic kidney disease, stage 3b: Secondary | ICD-10-CM | POA: Diagnosis not present

## 2021-08-24 DIAGNOSIS — Z79899 Other long term (current) drug therapy: Secondary | ICD-10-CM | POA: Diagnosis not present

## 2021-08-24 DIAGNOSIS — I440 Atrioventricular block, first degree: Secondary | ICD-10-CM | POA: Diagnosis not present

## 2021-08-24 NOTE — Progress Notes (Signed)
PCP: Idamae Schuller, MD Cardiology: Dr. Burt Knack HF Cardiology: Dr. Aundra Dubin  76 y.o. with history of aortic stenosis, chronic diastolic CHF, and CAD was referred by Dr. Burt Knack for evaluation of CHF.  Based on 12/21 echo and RHC/LHC in 12/21, the patient has moderate aortic stenosis. She had coronary angiography in 4/15 with rather diffuse moderate-severe coronary disease that was managed medically.  Cardiac MRI in 11/19 was concerning for cardiac amyloidosis. However, PYP scan was negative in 11/19.  Workup at that time also did not show a monoclonal protein to suggest AL amyloidosis.    Echo in 11/22 showed EF 60-65%, severe LVH, normal RV size and systolic function, mild MR, paradoxical low flow/low gradient severe aortic stenosis with mean gradient 24 mmHg and AVA 0.88 cm^2, small to moderate pericardial effusion.  PYP scan was repeated in 11/22, grade 1 with H/CL 1.07.  This is unlikely to be TTR cardiac amyloidosis.  Repeat urine immunofixation and myeloma panel were negative as well.  [employer] gene testing negative for hATTR mutation.   She was evaluated by hematology for pancytopenia.  This improved without intervention though she is still neutropenic.   Last seen for f/u 04/23. Appeared volume overloaded. Did not increase lasix d/t elevated Scr, 2.34 (had been 1.4-1.8). Hydralazine 25 TID added for hypertension.   Last seen for f/u 06/08. BP mrakedly elevated. Had not taken her medications. She was given 50 mg hydralazine PO in clinic and hydralazine increased to 50 mg TID. Volume looked okay on exam but BNP up 520>>1299. Weight up from last visit. Furosemide increased from 40 mg daily to 40 mg BID X 4 days.   Scr subsequently elevated at 2.5 on follow-up labs. Baseline several months ago had been 1.4-1.8.  She is here today for f/u to assess volume given worsening renal function. She reports her primary discontinued amlodipine about 2 weeks ago. Her furosemide was increased to 60 mg daily. She  states she had been experiencing a lot of lower extremity edema but this has improved significantly. Her weight is down 5 lb from last visit. She is wearing compression stockings. The patient limits sodium and fluid intake. She eats a lot of salads and fresh vegetables. Denies dyspnea, orthopnea or PND. Mobility extremely limited by arthritis, she is essentially wheelchair bound. Her legs are very weak when she attempts to stand.   No ETOH or tobacco use.  PMH: 1. CKD stage 3 2. H/o seizure disorder 3. H/o TIA 4. H/o Mobitz type 1 2nd degree AVB 5. HTN 6. CAD: LHC (4/15) with 80% and 50% tandem stenoses in the proximal LAD, 80% mid LCx, 85% dLCx, diffuse mid-distal disease up to 90% in RCA.  Diffuse coronary disease, treated medically.  7. Aortic stenosis: Suspect moderate AS. - Echo (12/21): AoV mean gradient 32, AVA 1.2 cm^2.  - RHC/LHC (12/21): AoV mean gradient 22, AVA 1.47 cm^2.  - Echo (11/22): EF 60-65%, severe LVH, normal RV size and systolic function, mild MR, paradoxical low flow/low gradient severe aortic stenosis with mean gradient 24 mmHg and AVA 0.88 cm^2, small to moderate pericardial effusion.   8. Chronic diastolic CHF:  - Cardiac MRI (11/19): EF 76%, moderate LVH, difficult to null LV myocardium with diffuse subendocardial LGE concerning for possible cardiac amyloidosis, moderate pericardial effusion.  - PYP scan (11/19): grade 0, H/CL 1.08.  - Echo (12/21): EF 60-65%, moderate LVH, normal RV, moderate pericardial effusion, moderate AS with AoV mean gradient 32, AVA 1.2 cm^2.  - Echo (11/22): EF  60-65%, severe LVH, normal RV size and systolic function, mild MR, paradoxical low flow/low gradient severe aortic stenosis with mean gradient 24 mmHg and AVA 0.88 cm^2, small to moderate pericardial effusion.   - PYP scan (11/22): grade 1 with H/CL 1.07.  This is unlikely to be TTR cardiac amyloidosis. - [employer] gene testing negative for hATTR mutation.  9. Arthritis: Severe, limited  to wheelchair.  10. Hyperlipidemia  Social History   Socioeconomic History   Marital status: Married    Spouse name: Not on file   Number of children: 2   Years of education: 4   Highest education level: Not on file  Occupational History   Occupation: retired  Tobacco Use   Smoking status: Former    Packs/day: 0.50    Years: 10.00    Total pack years: 5.00    Types: Cigarettes    Quit date: 06/03/2000    Years since quitting: 21.2   Smokeless tobacco: Never  Vaping Use   Vaping Use: Never used  Substance and Sexual Activity   Alcohol use: No    Alcohol/week: 0.0 standard drinks of alcohol   Drug use: No   Sexual activity: Not Currently    Partners: Male  Other Topics Concern   Not on file  Social History Narrative   Current Social History 06/20/2020        Patient lives with spouse in a home which is 1 story. There are not steps up to the entrance, the patient uses a ramp.       Patient's method of transportation is [employer].      The highest level of education was some high school.      The patient currently disabled.      Identified important Relationships are "God and my husband"       Pets : None       Interests / Fun: "Sitting on my front porch"       Current Stressors: None              Social Determinants of Radio broadcast assistant Strain: Not on file  Food Insecurity: Not on file  Transportation Needs: Not on file  Physical Activity: Not on file  Stress: Not on file  Social Connections: Not on file  Intimate Partner Violence: Not on file   Family History  Problem Relation Age of Onset   Hypertension Other    Heart disease Mother    Hypertension Mother    Cancer Father    Obesity Son    Asthma Sister    Heart disease Sister    Heart disease Sister    ROS: All systems reviewed and negative except as per HPI.   Current Outpatient Medications  Medication Sig Dispense Refill   alendronate (FOSAMAX) 70 MG tablet Take 35 mg by mouth every  Friday. Take with a full glass of water on an empty stomach. Taking 1/2 tablet once weekly     aspirin 81 MG EC tablet Take 81 mg by mouth daily.     Calcium Carb-Cholecalciferol (CALCIUM 500 +D) 500-400 MG-UNIT TABS Take 1 tablet by mouth 2 (two) times daily. 60 tablet 2   empagliflozin (JARDIANCE) 10 MG TABS tablet Take 1 tablet (10 mg total) by mouth daily before breakfast. 90 tablet 3   furosemide (LASIX) 40 MG tablet Take 60 mg by mouth daily.     hydrALAZINE (APRESOLINE) 50 MG tablet Take 1 tablet (50 mg total) by mouth 3 (three)  times daily. 270 tablet 3   lidocaine (LIDODERM) 5 % Place 1 patch onto the skin daily. Remove & Discard patch within 12 hours or as directed by MD 30 patch 0   Liniments (BLUE-EMU SUPER STRENGTH EX) Apply 1 application topically in the morning and at bedtime.     pantoprazole (PROTONIX) 40 MG tablet TAKE 1 TABLET BY MOUTH  DAILY 100 tablet 2   PHENobarbital (LUMINAL) 64.8 MG tablet Take 2 tablets (129.6 mg total) by mouth at bedtime. 60 tablet 5   potassium chloride SA (KLOR-CON M) 20 MEQ tablet Take 20 mEq by mouth daily.     rosuvastatin (CRESTOR) 20 MG tablet Take 1 tablet (20 mg total) by mouth daily. 90 tablet 3   zonisamide (ZONEGRAN) 100 MG capsule TAKE 2 CAPSULES BY MOUTH AT NIGHT 120 capsule 0   No current facility-administered medications for this encounter.   BP (!) 148/78   Pulse 61   Wt 66.9 kg (147 lb 6.4 oz)   SpO2 98%   BMI 27.85 kg/m  General:  Well appearing. Arrived in wheelchair HEENT: normal Neck: supple. JVP 7-8 cm.  Cor: PMI nondisplaced. Regular rate & rhythm. No rubs, gallops, 3/6 SEM RUSB with reduced S2 Lungs: clear Abdomen: soft, nontender, nondistended.  Extremities: no cyanosis, clubbing, rash, 1+ edema, TED hose on Neuro: alert & orientedx3, cranial nerves grossly intact. moves all 4 extremities w/o difficulty. Affect pleasant   Assessment/Plan 1. Aortic stenosis: 12/21 LHC/RHC and echo were suggestive of moderate aortic  stenosis. She has muffling of S2 on exam.  Repeat echo in 11/22 showed paradoxical low flow/low gradient severe aortic stenosis.  She was seen by Dr. Burt Knack for TAVR evaluation.  She is both minimally active and minimally symptomatic. She has a long 1st degree AVB.  TAVR could be done, but with minimal activity/symptoms and high risk for progressive heart block/PPM placement, we have elected to hold off for now.  - Repeat echo and f/u scheduled with Dr. Burt Knack 09/22 2. CAD: Moderate-severe diffuse CAD on coronary angiography in 4/15.  She does not have anginal symptoms but is also minimally active.  She would not be a candidate for CABG due to lack of mobility. No chest pain.  - Medically managed given no chest pain.  - Continue ASA 81.  - Continue Crestor.  - If TAVR considered in future, would need coronary angiography.  3. CKD stage 3b/IV: Scr typically 1.4-1.8, up to 2-2.5 recently. Scr 2.5 on yesterday's labs. - Will need to continue to monitor with diuretics - Has been referred to Nephrology 4. Chronic diastolic CHF: Cardiac MRI (see above) was concerning for cardiac amyloidosis (11/19), as was echo with moderate LVH and aortic stenosis. The aortic stenosis and the pericardial effusion seen by echo are also frequently seen with cardiac amyloidosis. She additionally has tingling/numbness in hands/feet that is consistent with peripheral neuropathy.  However, PYP scan in 11/19 was negative and myeloma workup was negative.  Repeat PYP scan in 11/22 was again negative and she did not have a transthyretin gene mutation on [employer] gene testing.  Myeloma workup was again negative in 10/22.  Therefore, cardiac amyloidosis seems unlikely and LVH is more likely to be due to HTN and aortic stenosis.   - NYHA status difficult to assess with severe functional impairment. Volume okay on exam today. ReDS 34%. Continue furosemide 60 mg daily.  - Continue Jardiance 10 mg daily (no recent UTI or yeast infection).  -  No ARB/ARNi or spiro  with CKD - Reinforced fluid restriction and low sodium diet - Continue compression stockings. 5. HTN:  -BP elevated today but hasn't taken medications today. - Continue hydralazine 50 TID - Recently taken off amlodipine by PCP. Suspect may have been d/t lower extremity edema. This has improved. 6. Conduction abnormality: Long 1st degree AVB 322 msec.   - Avoid beta blockade.  - High risk for progressive HB with TAVR, would likely end up with PPM.  7. Hyperlipidemia: Continue Crestor  Followup: 4-6 weeks with APP to assess volume recheck BMET/BNP, has f/u Dr. Burt Knack with echo 09/23  [facility], [place] Parents 08/24/2021

## 2021-08-24 NOTE — Patient Instructions (Signed)
It was great to see you today! No medication changes are needed at this time.   Your physician recommends that you schedule a follow-up appointment in: 4-6 weeks  in the Advanced Practitioners (PA/NP) Clinic    Do the following things EVERYDAY: Weigh yourself in the morning before breakfast. Write it down and keep it in a log. Take your medicines as prescribed Eat low salt foods--Limit salt (sodium) to 2000 mg per day.  Stay as active as you can everyday Limit all fluids for the day to less than 2 liters  At the [facility], you and your health needs are our priority. As part of our continuing mission to provide you with exceptional heart care, we have created designated Provider Care Teams. These Care Teams include your primary Cardiologist (physician) and Advanced Practice Providers (APPs- Physician Assistants and Nurse Practitioners) who all work together to provide you with the care you need, when you need it.   You may see any of the following providers on your designated Care Team at your next follow up: Dr Glori Bickers Dr Haynes Kerns, NP Lyda Jester, [place] [facility] [place], [place] Audry Riles, PharmD   Please be sure to bring in all your medications bottles to every appointment.   If you have any questions or concerns before your next appointment please send [place] a message through [place] or call our office at [phone].    TO LEAVE A MESSAGE FOR THE NURSE SELECT OPTION 2, PLEASE LEAVE A MESSAGE INCLUDING: YOUR NAME DATE OF BIRTH CALL BACK NUMBER REASON FOR CALL**this is important as we prioritize the call backs  YOU WILL RECEIVE A CALL BACK THE SAME DAY AS LONG AS YOU CALL BEFORE 4:00 PM

## 2021-08-24 NOTE — Progress Notes (Signed)
ReDS Vest / Clip - 08/24/21 1100       ReDS Vest / Clip   Station Marker A    Ruler Value 28    ReDS Value Range Low volume    ReDS Actual Value 34    Anatomical Comments sitting

## 2021-08-29 ENCOUNTER — Telehealth (HOSPITAL_COMMUNITY): Payer: Self-pay | Admitting: *Deleted

## 2021-08-29 NOTE — Telephone Encounter (Signed)
Received fax back from [facility], pt is sch to see Dr Hollie Salk there on 10/11/21

## 2021-08-30 ENCOUNTER — Encounter: Payer: Self-pay | Admitting: Neurology

## 2021-08-30 ENCOUNTER — Other Ambulatory Visit (INDEPENDENT_AMBULATORY_CARE_PROVIDER_SITE_OTHER): Payer: Medicare Other

## 2021-08-30 ENCOUNTER — Ambulatory Visit (INDEPENDENT_AMBULATORY_CARE_PROVIDER_SITE_OTHER): Payer: Medicare Other | Admitting: Neurology

## 2021-08-30 VITALS — BP 182/68 | HR 55 | Ht 61.0 in | Wt 147.0 lb

## 2021-08-30 DIAGNOSIS — G40209 Localization-related (focal) (partial) symptomatic epilepsy and epileptic syndromes with complex partial seizures, not intractable, without status epilepticus: Secondary | ICD-10-CM | POA: Diagnosis not present

## 2021-08-30 DIAGNOSIS — R29898 Other symptoms and signs involving the musculoskeletal system: Secondary | ICD-10-CM | POA: Diagnosis not present

## 2021-08-30 MED ORDER — PHENOBARBITAL 64.8 MG PO TABS: 129.6000 mg | ORAL_TABLET | Freq: Every day | ORAL | 3 refills | Status: DC

## 2021-08-30 MED ORDER — ZONISAMIDE 100 MG PO CAPS
ORAL_CAPSULE | ORAL | 3 refills | Status: DC
Start: 2021-08-30 — End: 2022-03-19

## 2021-08-30 NOTE — Patient Instructions (Signed)
Always good to see you.  Check Zonisamide level and Phenobarbital level  2. Continue Zonisamide 100mg : Take 2 capsules every night and Phenobarbital 64.8mg : take 2 capsules every night. Depending on levels, we may adjust dose.  3. Schedule EMG/NCV of the left lower extremity  4. Follow-up in 6 months, call for any changes   Seizure Precautions: 1. If medication has been prescribed for you to prevent seizures, take it exactly as directed.  Do not stop taking the medicine without talking to your doctor first, even if you have not had a seizure in a long time.   2. Avoid activities in which a seizure would cause danger to yourself or to others.  Don't operate dangerous machinery, swim alone, or climb in high or dangerous places, such as on ladders, roofs, or girders.  Do not drive unless your doctor says you may.  3. If you have any warning that you may have a seizure, lay down in a safe place where you can't hurt yourself.    4.  No driving for 6 months from last seizure, as per [facility].   Please refer to the following link on the [place] website for more information: http://[email or web address]/[email or web address]   5.  Maintain good sleep hygiene.  6.  Contact your doctor if you have any problems that may be related to the medicine you are taking.  7.  Call 911 and bring the patient back to the ED if:        A.  The seizure lasts longer than 5 minutes.       B.  The patient doesn't awaken shortly after the seizure  C.  The patient has new problems such as difficulty seeing, speaking or moving  D.  The patient was injured during the seizure  E.  The patient has a temperature over 102 F (39C)  F.  The patient vomited and now is having trouble breathing

## 2021-08-30 NOTE — Progress Notes (Signed)
NEUROLOGY FOLLOW UP OFFICE NOTE  Sherry [name] [id number] [birth year]  HISTORY OF PRESENT ILLNESS: I had the pleasure of seeing Sherry [name] in follow-up in the neurology clinic on 08/30/2021.  The patient was last seen over a year ago for right temporal lobe epilepsy. She is again accompanied by her husband who helps supplement the history today.  Records and images were personally reviewed where available.  She is Phenobarbital 64.8mg  2 tabs qhs and Zonisamide 200mg  qhs. Since her last visit, she and her husband deny any seizures or seizure-like symptoms. They deny any staring/unresponsiveness, gaps in time, olfactory/gustatory hallucinations, focal numbness/tingling, myoclonic jerks. No convulsions since age 15, her last focal seizures was in 2018. She has had worsening of GFR, GFR had been in the 40s since 2015, she has been on Zonisamide since 2015 started at [facility]. Her GFR in 2021 was 38, Zonisamide reduced to 200mg  qhs. Most recent GFR last week was 19, she will be seeing Nephrology. She denies any headaches, dizziness, vision changes.  She had previously reported that since her hospital admission in 01/2020 for hypovolemic shock, AKI, severe aortic stenosis, she has been unable to walk. She has been reporting leg weakness since at least 2019. She has a Theatre manager at home. She has left foot eversion today, she feels this has been ongoing for a year, no pain. She has occasional knee pain, no neck/back pain. No bowel/bladder dysfunction.    History on Initial Assessment 05/20/2016: This is a pleasant 77 yo RH woman with a history of hypertension, hyperlipidemia, AV block, CAD, and seizures. Records from [facility] were reviewed. She started having seizures in the 106s (age 39s), initially she was having generalized convulsions often preceded by a sensation of severe nausea, "sick to stomach," followed by eyes rolling up, shaking of both arms and legs with tongue bite. They were  initially occurring every 2 weeks while on Dilantin, none since adding Phenobarbital. They report the last GTC was probably in her 81s (>10 years ago). Family would also note episodes of unresponsiveness where her head drops forward slightly and she does not respond. She would be amnestic of these and feel like she woke up from sleep. Her daughter previously reported she seemed confused afterwards. There is note that these resolved after phenobarbital reduction in 2013, however her husband reports occasional episodes where he would be talking to her, then he comes back in the room and she would look at him like they did not talk earlier, she would not understand and say "huh?" and he asks if she heard him. He last noticed this around 1.5 months ago, prior to that was 7-8 months ago. She continues to have the sensation of a sickening feeling every few months or so, these always seem to occur when she is in the bath. She had one 2 weeks ago, these last up to a minute, she denies any confusion or focal symptoms. She denies any olfactory/gustatory hallucinations, deja vu, focal numbness/tingling/weakness, myoclonic jerks. She notices her hand twitches sometimes. Her husband denies any nocturnal seizures.   She is currently taking Phenobarbital 64.8mg  2 tablets daily and Zonisamide 300mg  qhs. She was previously drowsy on higher doses of Phenobarbital, and with reduction of dose, she currently denies any side effects to her medications. She denies any headaches, dizziness, diplopia, dysarthria/dysphagia, neck/back pain, bladder dysfunction. She has constipation. She needs a walker to ambulate and denies any falls. She denies any focal symptoms but reports tingling in both hands and  weakness in both legs, no pain. She reports sleep is good, and reports good compliance to medications. She reports a history of migraines, but these have resolved.   Epilepsy Risk Factors:  A maternal cousin had seizures when younger.  Otherwise she had a normal birth and early development.  There is no history of febrile convulsions, CNS infections such as meningitis/encephalitis, significant traumatic brain injury, neurosurgical procedures.   Prior AEDs: Dilantin (ineffective) Laboratory Data: Phenobarbital level 03/13/16 - 28. BMP showed a creatinine of 1.32, BUN 28; vitamin D level in 09/2015 was 25.9 EEG per [facility] notes: mild diffuse slowing on routine EEG MRI per [facility] notes: showed evidence of old strokes 1-hour EEG: Occasional right temporal slowing, frequent right frontotemporal epileptiform discharges    PAST MEDICAL HISTORY: Past Medical History:  Diagnosis Date   AKI (acute kidney injury) ([place]) 01/31/2020   Anemia    Aortic stenosis    Arthritis    Bilateral lower extremity edema    Bilateral lower extremity edema    Blurry vision, bilateral 05/08/2016   Bradycardia    a. 01/2013 - asymptomatic.   Chronic diastolic heart failure (HCC)    CKD (chronic kidney disease)    a. baseline CKD stage III   Coronary artery disease    GERD (gastroesophageal reflux disease)    Headache(784.0)    Heart block AV second degree 02/15/2013   Heart murmur    Hyperlipidemia    Hypertension    Hypotension    Hypovolemic shock ([place])    Internal and external hemorrhoids without complication 50/10/3265   Colonoscopy in 2008 recs repeat in 5 years   Intractable focal epilepsy with impairment of consciousness ([place]) 01/02/2012   Mobitz (type) I (Wenckebach's) atrioventricular block    a. 01/2013 - asymptomatic.   Morbid obesity ([place])    Seizure disorder ([place])    Seizures ([place])    TIA (transient ischemic attack) 2014   pt stated she had "mini strokes"   Vitamin D deficiency 04/22/2014    MEDICATIONS: Current Outpatient Medications on File Prior to Visit  Medication Sig Dispense Refill   alendronate (FOSAMAX) 70 MG tablet Take 35 mg by mouth every Friday. Take with a full glass of water on an empty stomach. Taking 1/2  tablet once weekly     aspirin 81 MG EC tablet Take 81 mg by mouth daily.     Calcium Carb-Cholecalciferol (CALCIUM 500 +D) 500-400 MG-UNIT TABS Take 1 tablet by mouth 2 (two) times daily. 60 tablet 2   empagliflozin (JARDIANCE) 10 MG TABS tablet Take 1 tablet (10 mg total) by mouth daily before breakfast. 90 tablet 3   furosemide (LASIX) 40 MG tablet Take 60 mg by mouth daily.     hydrALAZINE (APRESOLINE) 50 MG tablet Take 1 tablet (50 mg total) by mouth 3 (three) times daily. 270 tablet 3   lidocaine (LIDODERM) 5 % Place 1 patch onto the skin daily. Remove & Discard patch within 12 hours or as directed by MD 30 patch 0   Liniments (BLUE-EMU SUPER STRENGTH EX) Apply 1 application topically in the morning and at bedtime.     pantoprazole (PROTONIX) 40 MG tablet TAKE 1 TABLET BY MOUTH  DAILY 100 tablet 2   PHENobarbital (LUMINAL) 64.8 MG tablet Take 2 tablets (129.6 mg total) by mouth at bedtime. 60 tablet 5   potassium chloride SA (KLOR-CON M) 20 MEQ tablet Take 20 mEq by mouth daily.     rosuvastatin (CRESTOR) 20 MG tablet Take 1  tablet (20 mg total) by mouth daily. 90 tablet 3   zonisamide (ZONEGRAN) 100 MG capsule TAKE 2 CAPSULES BY MOUTH AT NIGHT 120 capsule 0   No current facility-administered medications on file prior to visit.    ALLERGIES: Allergies  Allergen Reactions   Ibuprofen Other (See Comments)    Dr advised pt not to take ibuprofen    FAMILY HISTORY: Family History  Problem Relation Age of Onset   Hypertension Other    Heart disease Mother    Hypertension Mother    Cancer Father    Obesity Son    Asthma Sister    Heart disease Sister    Heart disease Sister     SOCIAL HISTORY: Social History   Socioeconomic History   Marital status: Married    Spouse name: Not on file   Number of children: 2   Years of education: 4   Highest education level: Not on file  Occupational History   Occupation: retired  Tobacco Use   Smoking status: Former    Packs/day:  0.50    Years: 10.00    Total pack years: 5.00    Types: Cigarettes    Quit date: 06/03/2000    Years since quitting: 21.2   Smokeless tobacco: Never  Vaping Use   Vaping Use: Never used  Substance and Sexual Activity   Alcohol use: No    Alcohol/week: 0.0 standard drinks of alcohol   Drug use: No   Sexual activity: Not Currently    Partners: Male  Other Topics Concern   Not on file  Social History Narrative   Current Social History 06/20/2020        Patient lives with spouse in a home which is 1 story. There are not steps up to the entrance, the patient uses a ramp.       Patient's method of transportation is [employer].      The highest level of education was some high school.      The patient currently disabled.      Identified important Relationships are "God and my husband"       Pets : None       Interests / Fun: "Sitting on my front porch"       Current Stressors: None              Social Determinants of Radio broadcast assistant Strain: Not on file  Food Insecurity: Not on file  Transportation Needs: Not on file  Physical Activity: Not on file  Stress: Not on file  Social Connections: Not on file  Intimate Partner Violence: Not on file     PHYSICAL EXAM: Vitals:   08/30/21 0809  BP: (!) 182/68  Pulse: (!) 55  SpO2: 100%   General: No acute distress, sitting on wheelchair Head:  Normocephalic/atraumatic Skin/Extremities: No rash, no edema Neurological Exam: alert and awake. No aphasia or dysarthria. Fund of knowledge is appropriate.  Attention and concentration are normal.   Cranial nerves: Pupils equal, round. Extraocular movements intact with no nystagmus. Visual fields full.  No facial asymmetry.  Motor: Bulk and tone normal, muscle strength 5/5 throughout except for 2/5 left foot eversion. Left foot is everted. Finger to nose testing intact.  Gait not tested.    IMPRESSION: This is a pleasant 76 yo RH woman with a history of  hypertension,  hyperlipidemia, AV block, CAD, and right temporal lobe epilepsy. No convulsions since age 75, no focal seizures since 2018. She  has had continued worsening kidney function, most recent GFR is 19. We discussed checking Zonisamide and Phenobarbital levels, and potentially further reducing Zonisamide dose if necessary. For now, continue Zonisamide 200mg  qhs and Phenobarbital 64.8mg  2 tabs qhs, refills sent. She has reported inability to walk since 01/2020, exam shows good strength in both LE except for left foot eversion, ?dystonia versus weakness. EMG/NCV of the left LE will be ordered to further evaluate symptoms. She does not drive. Follow-up in 6 months, call for any changes.    Thank you for allowing me to participate in her care.  Please do not hesitate to call for any questions or concerns.    Ellouise Newer, M.D.   CC: Dr. Humphrey Rolls, Dr. Sharon Seller

## 2021-09-05 LAB — PHENOBARBITAL LEVEL: Phenobarbital, Serum: 27.1 mg/L (ref 15.0–40.0)

## 2021-09-05 LAB — ZONISAMIDE LEVEL: Zonisamide: 12.5 ug/mL (ref 10.0–40.0)

## 2021-09-06 ENCOUNTER — Encounter (HOSPITAL_COMMUNITY): Payer: Medicare Other

## 2021-09-07 ENCOUNTER — Encounter (HOSPITAL_COMMUNITY): Payer: Medicare Other

## 2021-09-14 ENCOUNTER — Encounter: Payer: Self-pay | Admitting: Cardiovascular Disease

## 2021-09-14 NOTE — Telephone Encounter (Signed)
Spoke with daughter Mariann Laster to let her know that we do not write Rx's for or manage DME (durable medical equipment). I recommended that she reach out to PCP Humphrey Rolls who can most likely get her what she needs.

## 2021-09-17 ENCOUNTER — Other Ambulatory Visit: Payer: Self-pay | Admitting: Physician Assistant

## 2021-09-17 ENCOUNTER — Telehealth: Payer: Self-pay

## 2021-09-17 DIAGNOSIS — E782 Mixed hyperlipidemia: Secondary | ICD-10-CM

## 2021-09-17 NOTE — Telephone Encounter (Signed)
This patient called in concerning her [place] chair is not working and needs some repairs,not charging and the chair is in bad shape. I sent a message to Andria Rhein for her to look into this for [place].The chair has not ever been repaired.I am not sure how long the patient has had the chair.I will wait for further instructions from [facility], [place] C7/31/20234:59 PM

## 2021-10-05 ENCOUNTER — Telehealth (HOSPITAL_COMMUNITY): Payer: Self-pay | Admitting: *Deleted

## 2021-10-05 ENCOUNTER — Encounter (HOSPITAL_COMMUNITY): Payer: Self-pay

## 2021-10-05 ENCOUNTER — Ambulatory Visit (HOSPITAL_COMMUNITY)
Admission: RE | Admit: 2021-10-05 | Discharge: 2021-10-05 | Disposition: A | Discharge status: Home / Self Care | Payer: Medicare Other | Source: Ambulatory Visit | Attending: Family Medicine | Admitting: Family Medicine

## 2021-10-05 VITALS — BP 180/90 | HR 56 | Ht 61.0 in | Wt 142.6 lb

## 2021-10-05 DIAGNOSIS — R200 Anesthesia of skin: Secondary | ICD-10-CM | POA: Insufficient documentation

## 2021-10-05 DIAGNOSIS — I2510 Atherosclerotic heart disease of native coronary artery without angina pectoris: Secondary | ICD-10-CM | POA: Diagnosis not present

## 2021-10-05 DIAGNOSIS — N1832 Chronic kidney disease, stage 3b: Secondary | ICD-10-CM | POA: Diagnosis not present

## 2021-10-05 DIAGNOSIS — Z7984 Long term (current) use of oral hypoglycemic drugs: Secondary | ICD-10-CM | POA: Insufficient documentation

## 2021-10-05 DIAGNOSIS — I3139 Other pericardial effusion (noninflammatory): Secondary | ICD-10-CM | POA: Insufficient documentation

## 2021-10-05 DIAGNOSIS — E785 Hyperlipidemia, unspecified: Secondary | ICD-10-CM | POA: Insufficient documentation

## 2021-10-05 DIAGNOSIS — N184 Chronic kidney disease, stage 4 (severe): Secondary | ICD-10-CM

## 2021-10-05 DIAGNOSIS — I350 Nonrheumatic aortic (valve) stenosis: Secondary | ICD-10-CM | POA: Diagnosis not present

## 2021-10-05 DIAGNOSIS — R202 Paresthesia of skin: Secondary | ICD-10-CM | POA: Insufficient documentation

## 2021-10-05 DIAGNOSIS — I5032 Chronic diastolic (congestive) heart failure: Secondary | ICD-10-CM | POA: Diagnosis not present

## 2021-10-05 DIAGNOSIS — I130 Hypertensive heart and chronic kidney disease with heart failure and stage 1 through stage 4 chronic kidney disease, or unspecified chronic kidney disease: Secondary | ICD-10-CM | POA: Insufficient documentation

## 2021-10-05 DIAGNOSIS — I10 Essential (primary) hypertension: Secondary | ICD-10-CM | POA: Diagnosis not present

## 2021-10-05 DIAGNOSIS — E782 Mixed hyperlipidemia: Secondary | ICD-10-CM | POA: Diagnosis not present

## 2021-10-05 DIAGNOSIS — Z79899 Other long term (current) drug therapy: Secondary | ICD-10-CM | POA: Insufficient documentation

## 2021-10-05 DIAGNOSIS — I440 Atrioventricular block, first degree: Secondary | ICD-10-CM | POA: Diagnosis not present

## 2021-10-05 LAB — BRAIN NATRIURETIC PEPTIDE: B Natriuretic Peptide: 370.2 pg/mL — ABNORMAL HIGH (ref 0.0–100.0)

## 2021-10-05 LAB — BASIC METABOLIC PANEL
Anion gap: 9 (ref 5–15)
BUN: 89 mg/dL — ABNORMAL HIGH (ref 8–23)
CO2: 23 mmol/L (ref 22–32)
Calcium: 8.7 mg/dL — ABNORMAL LOW (ref 8.9–10.3)
Chloride: 109 mmol/L (ref 98–111)
Creatinine, Ser: 2.82 mg/dL — ABNORMAL HIGH (ref 0.44–1.00)
GFR, Estimated: 17 mL/min — ABNORMAL LOW (ref >60)
Glucose, Bld: 81 mg/dL (ref 70–99)
Potassium: 4.4 mmol/L (ref 3.5–5.1)
Sodium: 141 mmol/L (ref 135–145)

## 2021-10-05 NOTE — Progress Notes (Signed)
PCP: Idamae Schuller, MD Cardiology: Dr. Burt Knack HF Cardiology: Dr. Aundra Dubin  76 y.o. with history of aortic stenosis, chronic diastolic CHF, and CAD was referred by Dr. Burt Knack for evaluation of CHF.  Based on 12/21 echo and RHC/LHC in 12/21, the patient has moderate aortic stenosis. She had coronary angiography in 4/15 with rather diffuse moderate-severe coronary disease that was managed medically.  Cardiac MRI in 11/19 was concerning for cardiac amyloidosis. However, PYP scan was negative in 11/19.  Workup at that time also did not show a monoclonal protein to suggest AL amyloidosis.    Echo in 11/22 showed EF 60-65%, severe LVH, normal RV size and systolic function, mild MR, paradoxical low flow/low gradient severe aortic stenosis with mean gradient 24 mmHg and AVA 0.88 cm^2, small to moderate pericardial effusion.  PYP scan was repeated in 11/22, grade 1 with H/CL 1.07.  This is unlikely to be TTR cardiac amyloidosis.  Repeat urine immunofixation and myeloma panel were negative as well.  [facility] gene testing negative for hATTR mutation.   She was evaluated by hematology for pancytopenia.  This improved without intervention though she is still neutropenic.   Follow up 07/26/21, BP markedly elevated and given 50 mg hydralazine PO in clinic and hydralazine increased to 50 mg tid.    Today she returns for HF follow up with her husband. Overall feeling fine. LE swelling comes and goes, but has been better lately. She does not have significant dyspnea walking around her house and completing ADLs if she takes her time. Denies palpitations, abnormal bleeding, CP, dizziness,or PND/Orthopnea. Appetite ok. No fever or chills. Weight at home 140 pounds. Taking all medications.    PMH: 1. CKD stage 3 2. H/o seizure disorder 3. H/o TIA 4. H/o Mobitz type 1 2nd degree AVB 5. HTN 6. CAD: LHC (4/15) with 80% and 50% tandem stenoses in the proximal LAD, 80% mid LCx, 85% dLCx, diffuse mid-distal disease up to 90% in  RCA.  Diffuse coronary disease, treated medically.  7. Aortic stenosis: Suspect moderate AS. - Echo (12/21): AoV mean gradient 32, AVA 1.2 cm^2.  - RHC/LHC (12/21): AoV mean gradient 22, AVA 1.47 cm^2.  - Echo (11/22): EF 60-65%, severe LVH, normal RV size and systolic function, mild MR, paradoxical low flow/low gradient severe aortic stenosis with mean gradient 24 mmHg and AVA 0.88 cm^2, small to moderate pericardial effusion.   8. Chronic diastolic CHF:  - Cardiac MRI (11/19): EF 76%, moderate LVH, difficult to null LV myocardium with diffuse subendocardial LGE concerning for possible cardiac amyloidosis, moderate pericardial effusion.  - PYP scan (11/19): grade 0, H/CL 1.08.  - Echo (12/21): EF 60-65%, moderate LVH, normal RV, moderate pericardial effusion, moderate AS with AoV mean gradient 32, AVA 1.2 cm^2.  - Echo (11/22): EF 60-65%, severe LVH, normal RV size and systolic function, mild MR, paradoxical low flow/low gradient severe aortic stenosis with mean gradient 24 mmHg and AVA 0.88 cm^2, small to moderate pericardial effusion.   - PYP scan (11/22): grade 1 with H/CL 1.07.  This is unlikely to be TTR cardiac amyloidosis. - [employer] gene testing negative for hATTR mutation.  9. Arthritis: Severe, limited to wheelchair.  10. Hyperlipidemia  Social History   Socioeconomic History   Marital status: Married    Spouse name: Not on file   Number of children: 2   Years of education: 4   Highest education level: Not on file  Occupational History   Occupation: retired  Tobacco Use   Smoking  status: Former    Packs/day: 0.50    Years: 10.00    Total pack years: 5.00    Types: Cigarettes    Quit date: 06/03/2000    Years since quitting: 21.3   Smokeless tobacco: Never  Vaping Use   Vaping Use: Never used  Substance and Sexual Activity   Alcohol use: No    Alcohol/week: 0.0 standard drinks of alcohol   Drug use: No   Sexual activity: Not Currently    Partners: Male  Other Topics  Concern   Not on file  Social History Narrative   Current Social History 06/20/2020        Patient lives with spouse in a home which is 1 story. There are not steps up to the entrance, the patient uses a ramp.       Patient's method of transportation is [employer].      The highest level of education was some high school.      The patient currently disabled.      Identified important Relationships are "God and my husband"       Pets : None       Interests / Fun: "Sitting on my front porch"       Current Stressors: None              Social Determinants of Radio broadcast assistant Strain: Not on file  Food Insecurity: Not on file  Transportation Needs: Not on file  Physical Activity: Not on file  Stress: Not on file  Social Connections: Not on file  Intimate Partner Violence: Not on file   Family History  Problem Relation Age of Onset   Hypertension Other    Heart disease Mother    Hypertension Mother    Cancer Father    Obesity Son    Asthma Sister    Heart disease Sister    Heart disease Sister    ROS: All systems reviewed and negative except as per HPI.   Current Outpatient Medications  Medication Sig Dispense Refill   alendronate (FOSAMAX) 70 MG tablet Take 35 mg by mouth every Friday. Take with a full glass of water on an empty stomach. Taking 1/2 tablet once weekly     aspirin 81 MG EC tablet Take 81 mg by mouth daily.     Calcium Carb-Cholecalciferol (CALCIUM 500 +D) 500-400 MG-UNIT TABS Take 1 tablet by mouth 2 (two) times daily. 60 tablet 2   empagliflozin (JARDIANCE) 10 MG TABS tablet Take 1 tablet (10 mg total) by mouth daily before breakfast. 90 tablet 3   furosemide (LASIX) 40 MG tablet Take 60 mg by mouth daily.     hydrALAZINE (APRESOLINE) 50 MG tablet Take 1 tablet (50 mg total) by mouth 3 (three) times daily. 270 tablet 3   lidocaine (LIDODERM) 5 % Place 1 patch onto the skin daily. Remove & Discard patch within 12 hours or as directed by MD 30 patch 0    Liniments (BLUE-EMU SUPER STRENGTH EX) Apply 1 application topically in the morning and at bedtime.     pantoprazole (PROTONIX) 40 MG tablet TAKE 1 TABLET BY MOUTH  DAILY 100 tablet 2   PHENobarbital (LUMINAL) 64.8 MG tablet Take 2 tablets (129.6 mg total) by mouth at bedtime. 180 tablet 3   potassium chloride SA (KLOR-CON M) 20 MEQ tablet Take 20 mEq by mouth daily.     rosuvastatin (CRESTOR) 20 MG tablet TAKE 1 TABLET BY MOUTH  DAILY 90 tablet  2   zonisamide (ZONEGRAN) 100 MG capsule TAKE 2 CAPSULES BY MOUTH AT NIGHT 180 capsule 3   No current facility-administered medications for this encounter.   Wt Readings from Last 3 Encounters:  10/05/21 64.7 kg (142 lb 9.6 oz)  08/30/21 66.7 kg (147 lb)  08/24/21 66.9 kg (147 lb 6.4 oz)   BP (!) 180/90   Pulse (!) 56   Ht 5\' 1"  (1.549 m)   Wt 64.7 kg (142 lb 9.6 oz)   SpO2 99%   BMI 26.94 kg/m  Physical Exam: General:  NAD. No resp difficulty, arrived in motorized scooter HEENT: Normal Neck: Supple. No JVD. Carotids 2+ bilat; no bruits. No lymphadenopathy or thryomegaly appreciated. Cor: PMI nondisplaced. Regular rate & rhythm. No rubs, gallop, 4/6 SEM RUSB, reduced S2 Lungs: Clear Abdomen: Soft, nontender, nondistended. No hepatosplenomegaly. No bruits or masses. Good bowel sounds. Extremities: No cyanosis, clubbing, rash, 1+ pedal edema edema Neuro: Alert & oriented x 3, cranial nerves grossly intact. Moves all 4 extremities w/o difficulty. Affect pleasant.  Assessment/Plan 1. Aortic stenosis: 12/21 LHC/RHC and echo were suggestive of moderate aortic stenosis. She has muffling of S2 on exam.  Repeat echo in 11/22 showed paradoxical low flow/low gradient severe aortic stenosis.  She was seen by Dr. Burt Knack for TAVR evaluation.  She is both minimally active and minimally symptomatic. She has a long 1st degree AVB.  TAVR could be done, but with minimal activity/symptoms and high risk for progressive heart block/PPM placement, we have elected  to hold off for now.  - Repeat echo and f/u scheduled with Dr. Burt Knack 09/22 2. CAD: Moderate-severe diffuse CAD on coronary angiography in 4/15.  She does not have anginal symptoms but is also minimally active.  She would not be a candidate for CABG due to lack of mobility. No chest pain.  - Medically managed given no chest pain.  - Continue ASA 81.  - Continue Crestor.  - If TAVR considered in future, would need coronary angiography.  3. CKD stage 3b/IV: Baseline Scr 1.4-1.8, recently more elevated. BMET today. - Has been referred to Nephrology. 4. Chronic diastolic CHF: Cardiac MRI (see above) was concerning for cardiac amyloidosis (11/19), as was echo with moderate LVH and aortic stenosis. The aortic stenosis and the pericardial effusion seen by echo are also frequently seen with cardiac amyloidosis. She additionally has tingling/numbness in hands/feet that is consistent with peripheral neuropathy.  However, PYP scan in 11/19 was negative and myeloma workup was negative.  Repeat PYP scan in 11/22 was again negative and she did not have a transthyretin gene mutation on [employer] gene testing.  Myeloma workup was again negative in 10/22.  Therefore, cardiac amyloidosis seems unlikely and LVH is more likely to be due to HTN and aortic stenosis.  She is not volume overloaded today, weight down 5 lbs. NYHA II-III, functional status difficult due to physical deconditioning. - Continue Lasix 60 mg daily.  - Continue Jardiance 10 mg daily (no recent UTI or yeast infection).  - No ARB/ARNi or spiro with CKD - Continue compression stockings. 5. HTN: BP elevated today but hasn't taken medications today. - Continue hydralazine 50 mg tid. - Recently taken off amlodipine by PCP. Suspect may have been d/t lower extremity edema. This has improved. 6. Conduction abnormality: Long 1st degree AVB 322 msec.   - Avoid beta blockade.  - High risk for progressive HB with TAVR, would likely end up with PPM.  7.  Hyperlipidemia: Continue Crestor  Follow up  with Dr. Aundra Dubin in 3 months.  Maricela Bo [facility] FNP-BC 10/05/2021

## 2021-10-05 NOTE — Telephone Encounter (Signed)
Unable to reach patient by phone, unable to leave message. Called daughter per Allena Katz, NP re: today's lab results. Asked her to have patient decrease Lasix to 40 mg (1 tablet) alternating with 60 mg (1.5 tablet). Daughter verbalized understanding of same. Contact information given for patient to call HF Clinic if any questions.  Zada Girt RN

## 2021-10-05 NOTE — Patient Instructions (Signed)
No change in medications. Return to see Dr. Aundra Dubin in 2 - 3 months. We will call you with date and time.

## 2021-10-11 DIAGNOSIS — I350 Nonrheumatic aortic (valve) stenosis: Secondary | ICD-10-CM | POA: Diagnosis not present

## 2021-10-11 DIAGNOSIS — I509 Heart failure, unspecified: Secondary | ICD-10-CM | POA: Diagnosis not present

## 2021-10-11 DIAGNOSIS — N390 Urinary tract infection, site not specified: Secondary | ICD-10-CM | POA: Diagnosis not present

## 2021-10-11 DIAGNOSIS — N184 Chronic kidney disease, stage 4 (severe): Secondary | ICD-10-CM | POA: Diagnosis not present

## 2021-10-11 DIAGNOSIS — E785 Hyperlipidemia, unspecified: Secondary | ICD-10-CM | POA: Diagnosis not present

## 2021-10-12 ENCOUNTER — Other Ambulatory Visit: Payer: Self-pay | Admitting: Nephrology

## 2021-10-12 DIAGNOSIS — I350 Nonrheumatic aortic (valve) stenosis: Secondary | ICD-10-CM

## 2021-10-12 DIAGNOSIS — I509 Heart failure, unspecified: Secondary | ICD-10-CM

## 2021-10-12 DIAGNOSIS — E785 Hyperlipidemia, unspecified: Secondary | ICD-10-CM

## 2021-10-12 DIAGNOSIS — N184 Chronic kidney disease, stage 4 (severe): Secondary | ICD-10-CM

## 2021-10-17 ENCOUNTER — Encounter: Payer: Self-pay | Admitting: *Deleted

## 2021-10-17 ENCOUNTER — Ambulatory Visit (INDEPENDENT_AMBULATORY_CARE_PROVIDER_SITE_OTHER): Payer: Medicare Other | Admitting: *Deleted

## 2021-10-17 ENCOUNTER — Ambulatory Visit (INDEPENDENT_AMBULATORY_CARE_PROVIDER_SITE_OTHER): Payer: Medicare Other | Admitting: Internal Medicine

## 2021-10-17 VITALS — BP 218/70 | HR 62 | Temp 98.2°F | Ht 61.0 in | Wt 146.9 lb

## 2021-10-17 VITALS — BP 188/68 | HR 48

## 2021-10-17 DIAGNOSIS — L602 Onychogryphosis: Secondary | ICD-10-CM

## 2021-10-17 DIAGNOSIS — L84 Corns and callosities: Secondary | ICD-10-CM

## 2021-10-17 DIAGNOSIS — I5032 Chronic diastolic (congestive) heart failure: Secondary | ICD-10-CM

## 2021-10-17 DIAGNOSIS — M810 Age-related osteoporosis without current pathological fracture: Secondary | ICD-10-CM | POA: Diagnosis not present

## 2021-10-17 DIAGNOSIS — Z0000 Encounter for general adult medical examination without abnormal findings: Secondary | ICD-10-CM | POA: Diagnosis not present

## 2021-10-17 DIAGNOSIS — I110 Hypertensive heart disease with heart failure: Secondary | ICD-10-CM | POA: Diagnosis not present

## 2021-10-17 DIAGNOSIS — Z87891 Personal history of nicotine dependence: Secondary | ICD-10-CM

## 2021-10-17 DIAGNOSIS — K219 Gastro-esophageal reflux disease without esophagitis: Secondary | ICD-10-CM

## 2021-10-17 DIAGNOSIS — I10 Essential (primary) hypertension: Secondary | ICD-10-CM

## 2021-10-17 MED ORDER — AMLODIPINE BESYLATE 5 MG PO TABS: 5.0000 mg | ORAL_TABLET | Freq: Every day | ORAL | 2 refills | Status: DC

## 2021-10-17 MED ORDER — FUROSEMIDE 40 MG PO TABS: ORAL_TABLET | ORAL | 11 refills | Status: DC

## 2021-10-17 NOTE — Progress Notes (Signed)
Subjective:   Sherry [name] is a 76 y.o. female who presents for Medicare Annual (Subsequent) preventive examination.  Review of Systems    Defer to pcp       Objective:    There were no vitals filed for this visit. There is no height or weight on file to calculate BMI.     10/17/2021    9:09 AM 08/30/2021    8:10 AM 03/27/2021   11:22 AM 10/26/2020   11:45 AM 07/19/2020    2:43 PM 06/20/2020    3:25 PM 06/05/2020    9:20 AM  Advanced Directives  Does Patient Have a Medical Advance Directive? No No No No No No No  Would patient like information on creating a medical advance directive? No - Patient declined   Yes (MAU/Ambulatory/Procedural Areas - Information given) No - Patient declined No - Patient declined No - Patient declined    Current Medications (verified) Outpatient Encounter Medications as of 10/17/2021  Medication Sig   alendronate (FOSAMAX) 70 MG tablet Take 35 mg by mouth every Friday. Take with a full glass of water on an empty stomach. Taking 1/2 tablet once weekly   aspirin 81 MG EC tablet Take 81 mg by mouth daily.   Calcium Carb-Cholecalciferol (CALCIUM 500 +D) 500-400 MG-UNIT TABS Take 1 tablet by mouth 2 (two) times daily.   empagliflozin (JARDIANCE) 10 MG TABS tablet Take 1 tablet (10 mg total) by mouth daily before breakfast.   furosemide (LASIX) 40 MG tablet Take 60 mg by mouth daily.   hydrALAZINE (APRESOLINE) 50 MG tablet Take 1 tablet (50 mg total) by mouth 3 (three) times daily.   lidocaine (LIDODERM) 5 % Place 1 patch onto the skin daily. Remove & Discard patch within 12 hours or as directed by MD   Liniments (BLUE-EMU SUPER STRENGTH EX) Apply 1 application topically in the morning and at bedtime.   pantoprazole (PROTONIX) 40 MG tablet TAKE 1 TABLET BY MOUTH  DAILY   PHENobarbital (LUMINAL) 64.8 MG tablet Take 2 tablets (129.6 mg total) by mouth at bedtime.   potassium chloride SA (KLOR-CON M) 20 MEQ tablet Take 20 mEq by mouth daily.   rosuvastatin  (CRESTOR) 20 MG tablet TAKE 1 TABLET BY MOUTH  DAILY   zonisamide (ZONEGRAN) 100 MG capsule TAKE 2 CAPSULES BY MOUTH AT NIGHT   No facility-administered encounter medications on file as of 10/17/2021.    Allergies (verified) Ibuprofen   History: Past Medical History:  Diagnosis Date   AKI (acute kidney injury) ([place]) 01/31/2020   Anemia    Aortic stenosis    Arthritis    Bilateral lower extremity edema    Bilateral lower extremity edema    Blurry vision, bilateral 05/08/2016   Bradycardia    a. 01/2013 - asymptomatic.   Chronic diastolic heart failure (HCC)    CKD (chronic kidney disease)    a. baseline CKD stage III   Coronary artery disease    GERD (gastroesophageal reflux disease)    Headache(784.0)    Heart block AV second degree 02/15/2013   Heart murmur    Hyperlipidemia    Hypertension    Hypotension    Hypovolemic shock ([place])    Internal and external hemorrhoids without complication 65/05/6501   Colonoscopy in 2008 recs repeat in 5 years   Intractable focal epilepsy with impairment of consciousness ([place]) 01/02/2012   Mobitz (type) I (Wenckebach's) atrioventricular block    a. 01/2013 - asymptomatic.   Morbid obesity ([place])  Seizure disorder ([place])    Seizures ([place])    TIA (transient ischemic attack) 2014   pt stated she had "mini strokes"   Vitamin D deficiency 04/22/2014   Past Surgical History:  Procedure Laterality Date   ABDOMINAL HYSTERECTOMY     LEFT AND RIGHT HEART CATHETERIZATION WITH CORONARY ANGIOGRAM N/A 05/24/2013   Procedure: LEFT AND RIGHT HEART CATHETERIZATION WITH CORONARY ANGIOGRAM;  Surgeon: Sinclair Grooms, MD;  Location: [facility] CATH LAB;  Service: Cardiovascular;  Laterality: N/A;   LEFT HEART CATH N/A 02/01/2020   Procedure: Left Heart Cath;  Surgeon: Jolaine Artist, MD;  Location: [facility] CV LAB;  Service: Cardiovascular;  Laterality: N/A;   RIGHT HEART CATH N/A 02/01/2020   Procedure: RIGHT HEART CATH;  Surgeon: Jolaine Artist,  MD;  Location: [facility] LAB;  Service: Cardiovascular;  Laterality: N/A;   Family History  Problem Relation Age of Onset   Hypertension Other    Heart disease Mother    Hypertension Mother    Cancer Father    Obesity Son    Asthma Sister    Heart disease Sister    Heart disease Sister    Social History   Socioeconomic History   Marital status: Married    Spouse name: Not on file   Number of children: 2   Years of education: 4   Highest education level: Not on file  Occupational History   Occupation: retired  Tobacco Use   Smoking status: Former    Packs/day: 0.50    Years: 10.00    Total pack years: 5.00    Types: Cigarettes    Quit date: 06/03/2000    Years since quitting: 21.3   Smokeless tobacco: Never  Vaping Use   Vaping Use: Never used  Substance and Sexual Activity   Alcohol use: No    Alcohol/week: 0.0 standard drinks of alcohol   Drug use: No   Sexual activity: Not Currently    Partners: Male  Other Topics Concern   Not on file  Social History Narrative   Current Social History 06/20/2020        Patient lives with spouse in a home which is 1 story. There are not steps up to the entrance, the patient uses a ramp.       Patient's method of transportation is [employer].      The highest level of education was some high school.      The patient currently disabled.      Identified important Relationships are "God and my husband"       Pets : None       Interests / Fun: "Sitting on my front porch"       Current Stressors: None              Social Determinants of Radio broadcast assistant Strain: Not on file  Food Insecurity: Not on file  Transportation Needs: Not on file  Physical Activity: Not on file  Stress: Not on file  Social Connections: Not on file    Tobacco Counseling Counseling given: Not Answered   Clinical Intake:                 Diabetic?No          Activities of Daily Living    10/17/2021    9:09 AM  10/26/2020   11:45 AM  In your present state of health, do you have any difficulty performing the following activities:  Hearing? 0 0  Vision? 0 0  Difficulty concentrating or making decisions? 0 0  Walking or climbing stairs? 1 1  Dressing or bathing? 0 1  Doing errands, shopping? 0 1    Patient Care Team: Idamae Schuller, MD as PCP - Cyndia Diver, MD as PCP - Cardiology (Cardiology) Cameron Sprang, MD as Consulting Physician (Neurology) Drema Pry as Social Worker  Indicate any recent Medical Services you may have received from other than [facility] providers in the past year (date may be approximate).     Assessment:   This is a routine wellness examination for [place]ha.  Hearing/Vision screen No results found.  Dietary issues and exercise activities discussed:     Goals Addressed   None   Depression Screen    10/26/2020   11:45 AM 07/19/2020    2:42 PM 07/05/2020    9:55 AM 06/20/2020    3:03 PM 06/05/2020    9:20 AM 02/08/2020    1:48 PM 10/27/2019    1:20 PM  PHQ 2/9 Scores  PHQ - 2 Score 0 0 0 0 0 0 0  PHQ- 9 Score 0  0 0       Fall Risk    10/17/2021    9:09 AM 08/30/2021    8:10 AM 10/26/2020   11:45 AM 07/19/2020    2:39 PM 07/05/2020    9:55 AM  Fall Risk   Falls in the past year? 0 1 0 0 0  Number falls in past yr: 0 0 0 0   Injury with Fall? 0 0 0 0   Risk for fall due to :   Impaired balance/gait;Impaired mobility Impaired balance/gait;Impaired mobility No Fall Risks  Follow up Falls evaluation completed  Falls evaluation completed;Falls prevention discussed Falls evaluation completed     FALL RISK PREVENTION PERTAINING TO THE HOME:  Any stairs in or around the home? No  If so, are there any without handrails? No  Home free of loose throw rugs in walkways, pet beds, electrical cords, etc? Yes  Adequate lighting in your home to reduce risk of falls? Yes   ASSISTIVE DEVICES UTILIZED TO PREVENT FALLS:  Life alert? No  Use of a cane, walker or  w/c? Yes  Grab bars in the bathroom?  Does not use bathroom Shower chair or bench in shower? No  Elevated toilet seat or a handicapped toilet? Yes   TIMED UP AND GO:  Was the test performed? No .  Length of time to ambulate 10 feet: 0 sec.     Cognitive Function:        Immunizations Immunization History  Administered Date(s) Administered   Influenza Split 04/22/2011, 11/14/2011   Influenza Whole 12/17/2007, 12/19/2009   Influenza, High Dose Seasonal PF 11/11/2018   Influenza,inj,Quad PF,6+ Mos 10/31/2012, 10/11/2015, 12/09/2016, 12/03/2017, 10/27/2019, 10/26/2020   PFIZER(Purple Top)SARS-COV-2 Vaccination 11/11/2019, 12/02/2019   Pneumococcal Conjugate-13 06/06/2014   Pneumococcal Polysaccharide-23 06/24/2011, 10/31/2012   Td 09/18/2009    TDAP status: Due, Education has been provided regarding the importance of this vaccine. Advised may receive this vaccine at local pharmacy or Health Dept. Aware to provide a copy of the vaccination record if obtained from local pharmacy or Health Dept. Verbalized acceptance and understanding.  Flu Vaccine status: Due, Education has been provided regarding the importance of this vaccine. Advised may receive this vaccine at local pharmacy or Health Dept. Aware to provide a copy of the vaccination record if obtained from local pharmacy or Health  Dept. Verbalized acceptance and understanding.  Pneumococcal vaccine status: Up to date  Covid-19 vaccine status: Completed vaccines  Qualifies for Shingles Vaccine? Yes   Zostavax completed No   Shingrix Completed?: No.    Education has been provided regarding the importance of this vaccine. Patient has been advised to call insurance company to determine out of pocket expense if they have not yet received this vaccine. Advised may also receive vaccine at local pharmacy or Health Dept. Verbalized acceptance and understanding.  Screening Tests Health Maintenance  Topic Date Due   Zoster Vaccines-  Shingrix (1 of 2) Never done   TETANUS/TDAP  09/19/2019   COVID-19 Vaccine (3 - Pfizer series) 01/27/2020   INFLUENZA VACCINE  09/18/2021   Pneumonia Vaccine 33+ Years old  Completed   DEXA SCAN  Completed   Hepatitis C Screening  Completed   HPV VACCINES  Aged Out   COLONOSCOPY (Pts 45-32yrs Insurance coverage will need to be confirmed)  [place] Maintenance Due  Topic Date Due   Zoster Vaccines- Shingrix (1 of 2) Never done   TETANUS/TDAP  09/19/2019   COVID-19 Vaccine (3 - Pfizer series) 01/27/2020   INFLUENZA VACCINE  09/18/2021    Colorectal cancer screening: No longer required.   Mammogram status: Completed -no . Repeat every year Pt declines appt   Lung Cancer Screening: (Low Dose CT Chest recommended if Age 59-80 years, 30 pack-year currently smoking OR have quit w/in 15years.) does not qualify.   Lung Cancer Screening Referral: n/a  Additional Screening:  Hepatitis C Screening: does not qualify; Completed 03/27/21 and 04/19/2015  Vision Screening: Recommended annual ophthalmology exams for early detection of glaucoma and other disorders of the eye. Is the patient up to date with their annual eye exam?  No  Who is the provider or what is the name of the office in which the patient attends annual eye exams? Will need referral  If pt is not established with a provider, would they like to be referred to a provider to establish care? Yes .   Dental Screening: Recommended annual dental exams for proper oral hygiene  Community Resource Referral / Chronic Care Management: CRR required this visit?  No   CCM required this visit?  No      Plan:     I have personally reviewed and noted the following in the patient's chart:   Medical and social history Use of alcohol, tobacco or illicit drugs  Current medications and supplements including opioid prescriptions. Patient is not currently taking opioid prescriptions. Functional ability  and status Nutritional status Physical activity Advanced directives List of other physicians Hospitalizations, surgeries, and ER visits in previous 12 months Vitals Screenings to include cognitive, depression, and falls Referrals and appointments  In addition, I have reviewed and discussed with patient certain preventive protocols, quality metrics, and best practice recommendations. A written personalized care plan for preventive services as well as general preventive health recommendations were provided to patient.     Despina Hidden [place], [place]   10/17/2021   Nurse Notes: face to face 87min  .awv

## 2021-10-17 NOTE — Progress Notes (Signed)
CC: PCP follow up  HPI:  Sherry [name] is a 76 y.o. with medical history of HTN, HLD, CAD, aortic stenosis, diastolic heart failure and hx of seizures and osteoporosis presenting to [facility] for a follow up.   Please see problem-based list for further details, assessments, and plans.  Past Medical History:  Diagnosis Date   AKI (acute kidney injury) ([place]) 01/31/2020   Anemia    Aortic stenosis    Arthritis    Bilateral lower extremity edema    Bilateral lower extremity edema    Blurry vision, bilateral 05/08/2016   Bradycardia    a. 01/2013 - asymptomatic.   Chronic diastolic heart failure (HCC)    CKD (chronic kidney disease)    a. baseline CKD stage III   Coronary artery disease    GERD (gastroesophageal reflux disease)    Headache(784.0)    Heart block AV second degree 02/15/2013   Heart murmur    Hyperlipidemia    Hypertension    Hypotension    Hypovolemic shock ([place])    Internal and external hemorrhoids without complication 43/04/2949   Colonoscopy in 2008 recs repeat in 5 years   Intractable focal epilepsy with impairment of consciousness ([place]) 01/02/2012   Mobitz (type) I (Wenckebach's) atrioventricular block    a. 01/2013 - asymptomatic.   Morbid obesity ([place])    Seizure disorder ([place])    Seizures ([place])    TIA (transient ischemic attack) 2014   pt stated she had "mini strokes"   Vitamin D deficiency 04/22/2014    Current Outpatient Medications (Endocrine & Metabolic):    denosumab (PROLIA) 60 MG/ML SOSY injection, Inject 60 mg into the skin every 6 (six) months.   empagliflozin (JARDIANCE) 10 MG TABS tablet, Take 1 tablet (10 mg total) by mouth daily before breakfast.  Current Outpatient Medications (Cardiovascular):    amLODipine (NORVASC) 5 MG tablet, Take 1 tablet (5 mg total) by mouth daily.   furosemide (LASIX) 40 MG tablet, Take 1 tablet (40 mg total) by mouth See admin instructions AND 1.5 tablets (60 mg total) See admin instructions. Take 60 mg (1  and 1.5 tablets)  and 40 mg (1 tablet) on alternating days..   hydrALAZINE (APRESOLINE) 50 MG tablet, Take 1 tablet (50 mg total) by mouth 3 (three) times daily.   rosuvastatin (CRESTOR) 20 MG tablet, TAKE 1 TABLET BY MOUTH  DAILY   Current Outpatient Medications (Analgesics):    aspirin 81 MG EC tablet, Take 81 mg by mouth daily.   Current Outpatient Medications (Other):    Calcium Carb-Cholecalciferol (CALCIUM 500 +D) 500-400 MG-UNIT TABS, Take 1 tablet by mouth 2 (two) times daily.   lidocaine (LIDODERM) 5 %, Place 1 patch onto the skin daily. Remove & Discard patch within 12 hours or as directed by MD   Liniments (BLUE-EMU SUPER STRENGTH EX), Apply 1 application topically in the morning and at bedtime.   pantoprazole (PROTONIX) 40 MG tablet, TAKE 1 TABLET BY MOUTH  DAILY   PHENobarbital (LUMINAL) 64.8 MG tablet, Take 2 tablets (129.6 mg total) by mouth at bedtime.   potassium chloride SA (KLOR-CON M) 20 MEQ tablet, Take 20 mEq by mouth daily.   zonisamide (ZONEGRAN) 100 MG capsule, TAKE 2 CAPSULES BY MOUTH AT NIGHT  Review of Systems:  Review of system negative unless stated in the problem list or HPI.    Physical Exam:  Vitals:   10/17/21 0907  BP: (!) 218/70  Pulse: 62  Temp: 98.2 F (36.8 C)  TempSrc: Oral  SpO2: 100%  Weight: 146 lb 14.4 oz (66.6 kg)  Height: 5\' 1"  (1.549 m)    Physical Exam General: NAD, wheel chair dependant (chronic) HENT: NCAT Lungs: CTAB, no wheeze, rhonchi or rales.  Cardiovascular: systolic murmur present, normal rate and rhythm, palpable radial pulses and confirmed pedal pulses with doppler  Abdomen: No TTP, normal bowel sounds MSK: No asymmetry or muscle atrophy.  Skin: hypertrophic and discolored nails. Discoloration wipes away with moist wipe. Callous on right great toe. Foot partially avulsed that appears chronic.     Neuro: Alert and oriented x4. CN grossly intact Psych: Normal mood and normal affect   Assessment & Plan:    Osteoporosis Was on alendronate and that was switched to Prolia q57months.  T score 0.8 in 2022. She was instructed to bring the prolia to the clinic for an injection.Unfortunately patient misplaced the prolia that was sent to her. Will send in another refill and have patient come back in 2-3 weeks for injection and a follow up.      Essential hypertension Significantly hypertensive today at 218/70 and repeat BP at 188/68. Pt endorsing no symptoms. She is on hydralazine 50 TID, Alternating days of 60 mg qd and 40 mg qd, and Jardiance 10 mg qd. She was on amlodipine 10 mg qd but appears that was discontinued. Will restart that at 5 mg qd. Her renal function has declined most likely from her HTN and her AS. She established with [facility]. Would recommend continuing following up with Cardiology and [facility] as her kidney disease is advanced and she has significant cardiac disease. She appears overwhelmed by all the doctors visit and stated she did not want to come in today. Counseled the patient on importance of primary care doctors as we can help her navigate this journey.  -Continue lasix at current dose, continue Jardiance, and start amlodipine 5 mg -Follow up in 2 weeks for ensure HTN is controlled.   Chronic diastolic congestive heart failure ([place]) Follows with cardiology and plan for a repeat echo 10/2021.  Onychauxis Patient presented with toe nail discoloration and concern for infection. It appears she has been using bio-freeze on her feet as she gets pain intermittently especially in the right great toe. The discoloration was removable with moist wipe. Advised foot care to the patient including warm baths and using moisturizer. She will benefit from seeing podiatry. -Podiatry referral placed.    See Encounters Tab for problem based charting.  Patient discussed with Dr. Denzil Magnuson, MD Tillie Rung. [facility] Internal Medicine Residency, PGY-2

## 2021-10-17 NOTE — Patient Instructions (Signed)

## 2021-10-17 NOTE — Patient Instructions (Addendum)
Ms.Tate B Dobosz, it was a pleasure seeing you today! You endorsed feeling well today. Below are some of the things we talked about this visit. We look forward to seeing you in the follow up appointment!  Today we discussed: For your toes, we will refer you to foot doctor, so they can take care of your feet.  For your blood pressure, please restart taking your amlodipine. We will start at 5 mg. Check you blood pressures daily and record them. Please bring a log to the next visit.  You stated you don't have the prolia at home. We will see if we can get this medication prescribed and injected at the next visit.    I have ordered the following labs today:  Lab Orders  No laboratory test(s) ordered today      Referrals ordered today:   Referral Orders         Ambulatory referral to Podiatry       I have ordered the following medication/changed the following medications:   Stop the following medications: There are no discontinued medications.   Start the following medications: Meds ordered this encounter  Medications   amLODipine (NORVASC) 5 MG tablet    Sig: Take 1 tablet (5 mg total) by mouth daily.    Dispense:  30 tablet    Refill:  2     Follow-up: 1 to 2 week follow up   Please make sure to arrive 15 minutes prior to your next appointment. If you arrive late, you may be asked to reschedule.   We look forward to seeing you next time. Please call our clinic at [phone] if you have any questions or concerns. The best time to call is Monday-Friday from 9am-4pm, but there is someone available 24/7. If after hours or the weekend, call the main hospital number and ask for the Internal Medicine Resident On-Call. If you need medication refills, please notify your pharmacy one week in advance and they will send [place] a request.  Thank you for letting us take part in your care. Wishing you the best!  Thank you, Idamae Schuller, MD

## 2021-10-18 ENCOUNTER — Ambulatory Visit
Admission: RE | Admit: 2021-10-18 | Discharge: 2021-10-18 | Disposition: A | Discharge status: Home / Self Care | Payer: Medicare Other | Source: Ambulatory Visit | Attending: Nephrology | Admitting: Nephrology

## 2021-10-18 DIAGNOSIS — E785 Hyperlipidemia, unspecified: Secondary | ICD-10-CM

## 2021-10-18 DIAGNOSIS — N281 Cyst of kidney, acquired: Secondary | ICD-10-CM | POA: Diagnosis not present

## 2021-10-18 DIAGNOSIS — N189 Chronic kidney disease, unspecified: Secondary | ICD-10-CM | POA: Diagnosis not present

## 2021-10-18 DIAGNOSIS — I509 Heart failure, unspecified: Secondary | ICD-10-CM

## 2021-10-18 DIAGNOSIS — N184 Chronic kidney disease, stage 4 (severe): Secondary | ICD-10-CM

## 2021-10-18 DIAGNOSIS — I350 Nonrheumatic aortic (valve) stenosis: Secondary | ICD-10-CM

## 2021-10-19 DIAGNOSIS — L602 Onychogryphosis: Secondary | ICD-10-CM | POA: Insufficient documentation

## 2021-10-19 MED ORDER — DENOSUMAB 60 MG/ML ~~LOC~~ SOSY: 60.0000 mg | PREFILLED_SYRINGE | SUBCUTANEOUS | 1 refills | Status: DC

## 2021-10-19 NOTE — Assessment & Plan Note (Signed)
Follows with cardiology and plan for a repeat echo 10/2021.

## 2021-10-19 NOTE — Assessment & Plan Note (Signed)
Was on alendronate and that was switched to Prolia q50months.  T score 0.8 in 2022. She was instructed to bring the prolia to the clinic for an injection.Unfortunately patient misplaced the prolia that was sent to her. Will send in another refill and have patient come back in 2-3 weeks for injection and a follow up.

## 2021-10-19 NOTE — Assessment & Plan Note (Addendum)
Significantly hypertensive today at 218/70 and repeat BP at 188/68. Pt endorsing no symptoms. She is on hydralazine 50 TID, Alternating days of 60 mg qd and 40 mg qd, and Jardiance 10 mg qd. She was on amlodipine 10 mg qd but appears that was discontinued. Will restart that at 5 mg qd. Her renal function has declined most likely from her HTN and her AS. She established with [facility]. Would recommend continuing following up with Cardiology and [facility] as her kidney disease is advanced and she has significant cardiac disease. She appears overwhelmed by all the doctors visit and stated she did not want to come in today. Counseled the patient on importance of primary care doctors as we can help her navigate this journey.  -Continue lasix at current dose, continue Jardiance, and start amlodipine 5 mg -Follow up in 2 weeks for ensure HTN is controlled.

## 2021-10-19 NOTE — Assessment & Plan Note (Signed)
Patient presented with toe nail discoloration and concern for infection. It appears she has been using bio-freeze on her feet as she gets pain intermittently especially in the right great toe. The discoloration was removable with moist wipe. Advised foot care to the patient including warm baths and using moisturizer. She will benefit from seeing podiatry. -Podiatry referral placed.

## 2021-10-22 ENCOUNTER — Other Ambulatory Visit (HOSPITAL_COMMUNITY): Payer: Self-pay | Admitting: Cardiology

## 2021-10-23 NOTE — Progress Notes (Signed)
Internal Medicine Clinic Attending  Case discussed with Dr. Khan  At the time of the visit.  We reviewed the resident's history and exam and pertinent patient test results.  I agree with the assessment, diagnosis, and plan of care documented in the resident's note.  

## 2021-10-25 DIAGNOSIS — Z23 Encounter for immunization: Secondary | ICD-10-CM | POA: Diagnosis not present

## 2021-10-31 ENCOUNTER — Ambulatory Visit (INDEPENDENT_AMBULATORY_CARE_PROVIDER_SITE_OTHER): Payer: Medicare Other | Admitting: Internal Medicine

## 2021-10-31 ENCOUNTER — Encounter: Payer: Self-pay | Admitting: Internal Medicine

## 2021-10-31 VITALS — BP 172/63 | HR 51 | Temp 98.1°F | Ht 61.0 in | Wt 143.2 lb

## 2021-10-31 DIAGNOSIS — M810 Age-related osteoporosis without current pathological fracture: Secondary | ICD-10-CM

## 2021-10-31 DIAGNOSIS — I350 Nonrheumatic aortic (valve) stenosis: Secondary | ICD-10-CM

## 2021-10-31 DIAGNOSIS — I10 Essential (primary) hypertension: Secondary | ICD-10-CM

## 2021-10-31 DIAGNOSIS — I129 Hypertensive chronic kidney disease with stage 1 through stage 4 chronic kidney disease, or unspecified chronic kidney disease: Secondary | ICD-10-CM

## 2021-10-31 DIAGNOSIS — N184 Chronic kidney disease, stage 4 (severe): Secondary | ICD-10-CM | POA: Diagnosis not present

## 2021-10-31 DIAGNOSIS — Z87891 Personal history of nicotine dependence: Secondary | ICD-10-CM

## 2021-10-31 DIAGNOSIS — Z7984 Long term (current) use of oral hypoglycemic drugs: Secondary | ICD-10-CM

## 2021-10-31 DIAGNOSIS — G8314 Monoplegia of lower limb affecting left nondominant side: Secondary | ICD-10-CM

## 2021-10-31 DIAGNOSIS — E782 Mixed hyperlipidemia: Secondary | ICD-10-CM

## 2021-10-31 NOTE — Patient Instructions (Signed)
Sherry [name], it was a pleasure seeing you today! You endorsed feeling well today. Below are some of the things we talked about this visit. We look forward to seeing you in the follow up appointment!  Today we discussed: Continue taking your medications. I will place a referral for home health that can assist you with medications.  I would like to see you in 3-4 weeks.   I have ordered the following labs today:   Lab Orders         BMP8+Anion Gap       Referrals ordered today:   Referral Orders  No referral(s) requested today     I have ordered the following medication/changed the following medications:   Stop the following medications: There are no discontinued medications.   Start the following medications: No orders of the defined types were placed in this encounter.    Follow-up: 3-4 week follow up; coordinate with an appointment.   Please make sure to arrive 15 minutes prior to your next appointment. If you arrive late, you may be asked to reschedule.   We look forward to seeing you next time. Please call our clinic at [phone] if you have any questions or concerns. The best time to call is Monday-Friday from 9am-4pm, but there is someone available 24/7. If after hours or the weekend, call the main hospital number and ask for the Internal Medicine Resident On-Call. If you need medication refills, please notify your pharmacy one week in advance and they will send [place] a request.  Thank you for letting us take part in your care. Wishing you the best!  Thank you, Idamae Schuller, MD

## 2021-10-31 NOTE — Progress Notes (Signed)
CC: PCP follow up  HPI:  [name] is a 76 y.o. with medical history of HTN, HLD, Aortic Stenosis, CHF, CKD IV, osteoporosis, hx of CVA presenting to [facility] for PCP follow up.   Please see problem-based list for further details, assessments, and plans.  Past Medical History:  Diagnosis Date   AKI (acute kidney injury) ([place]) 01/31/2020   Anemia    Aortic stenosis    Arthritis    Bilateral lower extremity edema    Bilateral lower extremity edema    Blurry vision, bilateral 05/08/2016   Bradycardia    a. 01/2013 - asymptomatic.   Chronic diastolic heart failure (HCC)    Chronic sinus bradycardia 07/02/2012   CKD (chronic kidney disease)    a. baseline CKD stage III   Coronary artery disease    Epilepsy ([place]) 06/04/2006   EEG 02/19/12 " Interpretation:  This is an abnormal EEG demonstrating continuous and generalized slowing of the background in the theta frequency. Irregular rhythm of EKG noted.  Clinical correlation: the generalized slowing is consistent with a mild encephalopathy of nonspecific etiologies for which the differential would include infectious, toxic, metabolic, inflammatory, and vascular etiologies.    GERD (gastroesophageal reflux disease)    Headache(784.0)    Heart block AV second degree 02/15/2013   Heart murmur    Hyperlipidemia    Hypertension    Hypotension    Hypovolemic shock ([place])    Internal and external hemorrhoids without complication 70/07/2374   Colonoscopy in 2008 recs repeat in 5 years   Intractable focal epilepsy with impairment of consciousness ([place]) 01/02/2012   Late effects of cerebrovascular disease 01/23/2012   MRI Brain -Calvarium/skull base: No focal marrow replacing lesion suggestive of neoplasm. -Orbits: Grossly unremarkable. -Paranasal sinuses: Imaged portions clear. -Brain: No acute abnormalities such as hemorrhage, hydrocephalus, acute ischemia, or evidence of a mass lesion.  There is patchy bilateral cerebral white matter T2 and  flair signal hyper intensities.  Centimeter or smaller CSF intensity   Mobitz (type) I (Wenckebach's) atrioventricular block    a. 01/2013 - asymptomatic.   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   TIA (transient ischemic attack) 2014   pt stated she had "mini strokes"   Vitamin D deficiency 04/22/2014    Current Outpatient Medications (Endocrine & Metabolic):    denosumab (PROLIA) 60 MG/ML SOSY injection, Inject 60 mg into the skin every 6 (six) months. (Patient not taking: Reported on 11/02/2021)   JARDIANCE 10 MG TABS tablet, TAKE 1 TABLET BY MOUTH DAILY  BEFORE BREAKFAST  Current Outpatient Medications (Cardiovascular):    amLODipine (NORVASC) 5 MG tablet, Take 1 tablet (5 mg total) by mouth daily.   furosemide (LASIX) 40 MG tablet, Take 1 tablet (40 mg total) by mouth See admin instructions AND 1.5 tablets (60 mg total) See admin instructions. Take 60 mg (1 and 1.5 tablets)  and 40 mg (1 tablet) on alternating days..   hydrALAZINE (APRESOLINE) 50 MG tablet, Take 1 tablet (50 mg total) by mouth 3 (three) times daily.   rosuvastatin (CRESTOR) 20 MG tablet, TAKE 1 TABLET BY MOUTH  DAILY   Current Outpatient Medications (Analgesics):    aspirin 81 MG EC tablet, Take 81 mg by mouth daily.   Current Outpatient Medications (Other):    Calcium Carb-Cholecalciferol (CALCIUM 500 +  D) 500-400 MG-UNIT TABS, Take 1 tablet by mouth 2 (two) times daily.   lidocaine (LIDODERM) 5 %, Place 1 patch onto the skin daily. Remove & Discard patch within 12 hours or as directed by MD   Liniments (BLUE-EMU SUPER STRENGTH EX), Apply 1 application topically in the morning and at bedtime.   pantoprazole (PROTONIX) 40 MG tablet, TAKE 1 TABLET BY MOUTH  DAILY   PHENobarbital (LUMINAL) 64.8 MG tablet, Take 2 tablets (129.6 mg total) by mouth at  bedtime.   potassium chloride SA (KLOR-CON M) 20 MEQ tablet, Take 20 mEq by mouth daily.   zonisamide (ZONEGRAN) 100 MG capsule, TAKE 2 CAPSULES BY MOUTH AT NIGHT  Review of Systems:  Review of system negative unless stated in the problem list or HPI.    Physical Exam:  Vitals:   10/31/21 1337  BP: (!) 172/63  Pulse: (!) 51  Temp: 98.1 F (36.7 C)  TempSrc: Oral  SpO2: 100%  Weight: 143 lb 3.2 oz (65 kg)  Height: 5\' 1"  (1.549 m)    Physical Exam General: NAD, in wheel chair HENT: NCAT Lungs: CTAB, no wheeze, rhonchi or rales.  Cardiovascular: Normal heart sounds, 3/6 systolic murmur in aortic region, 2+ pulses in all extremities. No LE edema Abdomen: No TTP, normal bowel sounds MSK: No asymmetry or muscle atrophy.  Skin: no lesions noted on exposed skin Neuro: Alert and oriented x4. CN grossly intact Psych: Normal mood and normal affect   Assessment & Plan:   Essential hypertension Pt has HTN. She is on hydralazine 50 mg TID, lasix 60 and 40 mg alternating, Jardiance 10 mg qd, Amlodipine 5 mg qd. BP in the clinic 172/63. Husband states bp better controlled at home with readings in 130s-140s/80s. Last creatinine was 2.82 in 10/05/21. Sees CKA. BMP shows improvement in renal fxn with 2.11. Patient unsure if she has started amlodipine recommended at last visit but is overwhelmed with all medications and appointments. Plan to continue current medications right now and place a home nursing order to help patient with medications and ensure patient is taking the prescribed medications. Will follow up in 3-4 weeks.  -Continue current meds -Place home health nursing referral -Follow up in 3-4 weeks -Continue to follow with cardiology   CKD (chronic kidney disease), stage IV ([place]) Patient has CKDIV. Creatinine with improvement from 2.8 to 2.1. Nephrology suspects cardiorenal. US renal shows medical renal disease. Plan to continue current therapy and control her blood pressure.   -Continue current regimen -Continue to follow with [facility]  Hyperlipidemia Patient has HLD and current medication is crestor 20 mg qd. Lipid panel 3 months ago shows LDL 119 Chol 219. Goal is LDL <70. Will consider increasing her Crestor to 40 mg qd at next visit.   Osteoporosis Prolia was refilled and dispense date was shown for 10/19/2021 from [facility] but patient states she did not receive it. She was to bring her Prolia to today's visit and allow the nursing staff to give her the injection. Patient states she will look for this medication but doesn't remember receiving it. For this reason, I believe patient will benefit from [facility] nursing. Will reach out to our nursing staff and pharmacy to see if we can get this medication for this patient.     See Encounters Tab for problem based charting.  Patient discussed with Dr. Roderic Ovens, MD Tillie Rung. [facility], PGY-2

## 2021-11-01 ENCOUNTER — Ambulatory Visit (INDEPENDENT_AMBULATORY_CARE_PROVIDER_SITE_OTHER): Payer: Medicare Other | Admitting: Podiatry

## 2021-11-01 DIAGNOSIS — M79675 Pain in left toe(s): Secondary | ICD-10-CM | POA: Diagnosis not present

## 2021-11-01 DIAGNOSIS — M79674 Pain in right toe(s): Secondary | ICD-10-CM

## 2021-11-01 DIAGNOSIS — B351 Tinea unguium: Secondary | ICD-10-CM | POA: Diagnosis not present

## 2021-11-01 NOTE — Patient Instructions (Signed)
Look for urea 40% cream or ointment and apply to the thickened dry skin / calluses. This can be bought over the counter, at a pharmacy or online such as [employer].  

## 2021-11-02 ENCOUNTER — Ambulatory Visit (HOSPITAL_COMMUNITY): Payer: Medicare Other | Attending: Cardiovascular Disease

## 2021-11-02 ENCOUNTER — Encounter: Payer: Self-pay | Admitting: Cardiovascular Disease

## 2021-11-02 ENCOUNTER — Ambulatory Visit (INDEPENDENT_AMBULATORY_CARE_PROVIDER_SITE_OTHER): Payer: Medicare Other | Admitting: Cardiovascular Disease

## 2021-11-02 VITALS — BP 180/90 | HR 95 | Ht 61.0 in | Wt 134.8 lb

## 2021-11-02 DIAGNOSIS — N184 Chronic kidney disease, stage 4 (severe): Secondary | ICD-10-CM | POA: Insufficient documentation

## 2021-11-02 DIAGNOSIS — I2510 Atherosclerotic heart disease of native coronary artery without angina pectoris: Secondary | ICD-10-CM | POA: Diagnosis not present

## 2021-11-02 DIAGNOSIS — I440 Atrioventricular block, first degree: Secondary | ICD-10-CM

## 2021-11-02 DIAGNOSIS — I5032 Chronic diastolic (congestive) heart failure: Secondary | ICD-10-CM | POA: Insufficient documentation

## 2021-11-02 DIAGNOSIS — I350 Nonrheumatic aortic (valve) stenosis: Secondary | ICD-10-CM | POA: Diagnosis not present

## 2021-11-02 LAB — BMP8+ANION GAP
Anion Gap: 17 mmol/L (ref 10.0–18.0)
BUN/Creatinine Ratio: 31 — ABNORMAL HIGH (ref 12–28)
BUN: 66 mg/dL — ABNORMAL HIGH (ref 8–27)
CO2: 19 mmol/L — ABNORMAL LOW (ref 20–29)
Calcium: 8.5 mg/dL — ABNORMAL LOW (ref 8.7–10.3)
Chloride: 104 mmol/L (ref 96–106)
Creatinine, Ser: 2.11 mg/dL — ABNORMAL HIGH (ref 0.57–1.00)
Glucose: 75 mg/dL (ref 70–99)
Potassium: 4.1 mmol/L (ref 3.5–5.2)
Sodium: 140 mmol/L (ref 134–144)
eGFR: 24 mL/min/{1.73_m2} — ABNORMAL LOW (ref >59)

## 2021-11-02 LAB — ECHOCARDIOGRAM COMPLETE
AR max vel: 0.9 cm2
AV Area VTI: 0.95 cm2
AV Area mean vel: 0.98 cm2
AV Mean grad: 29.8 mmHg
AV Peak grad: 55.7 mmHg
Ao pk vel: 3.73 m/s
Area-P 1/2: 1.83 cm2
P 1/2 time: 646 msec
S' Lateral: 2.3 cm

## 2021-11-02 NOTE — Assessment & Plan Note (Addendum)
Patient has HLD and current medication is crestor 20 mg qd. Lipid panel 3 months ago shows LDL 119 Chol 219. Goal is LDL <70. Will consider increasing her Crestor to 40 mg qd at next visit.

## 2021-11-02 NOTE — Progress Notes (Signed)
Cardiology Office Note:    Date:  11/02/2021   ID:  Sherry [name], DOB 1945/04/30, MRN [id number]  PCP:  Sherry [name], [facility] Cardiologist:  Sherren Mocha, MD     Referring MD: Sherry Schuller, MD   Chief Complaint  Patient presents with   Aortic Stenosis    History of Present Illness:    Sherry [name] is a 76 y.o. female with a hx of hypertension, severe LVH, conduction disease with remote history of 2: 1 AV block, and aortic stenosis.  The patient is here for follow-up evaluation.  She has been followed closely in the advanced heart failure clinic as well.  She had an episode of marked thrombocytopenia but this recovered on its own without any specific intervention.  The patient is here with her husband today.  She is in a motorized scooter.  She has chronic leg weakness and her legs continue to worsen.  She cannot stand for very long at all.  She remains remarkably asymptomatic with respect to her cardiac disease.  She denies chest pain, shortness of breath, orthopnea, or PND.  She has mild chronic leg swelling related to her immobility.  No other symptoms reported.  No lightheadedness, syncope, or heart palpitations.  Past Medical History:  Diagnosis Date   AKI (acute kidney injury) ([place]) 01/31/2020   Anemia    Aortic stenosis    Arthritis    Bilateral lower extremity edema    Bilateral lower extremity edema    Blurry vision, bilateral 05/08/2016   Bradycardia    a. 01/2013 - asymptomatic.   Chronic diastolic heart failure (HCC)    Chronic sinus bradycardia 07/02/2012   CKD (chronic kidney disease)    a. baseline CKD stage III   Coronary artery disease    Epilepsy ([place]) 06/04/2006   EEG 02/19/12 " Interpretation:  This is an abnormal EEG demonstrating continuous and generalized slowing of the background in the theta frequency. Irregular rhythm of EKG noted.  Clinical correlation: the generalized slowing is consistent with a mild  encephalopathy of nonspecific etiologies for which the differential would include infectious, toxic, metabolic, inflammatory, and vascular etiologies.    GERD (gastroesophageal reflux disease)    Headache(784.0)    Heart block AV second degree 02/15/2013   Heart murmur    Hyperlipidemia    Hypertension    Hypotension    Hypovolemic shock ([place])    Internal and external hemorrhoids without complication 00/09/6759   Colonoscopy in 2008 recs repeat in 5 years   Intractable focal epilepsy with impairment of consciousness ([place]) 01/02/2012   Late effects of cerebrovascular disease 01/23/2012   MRI Brain -Calvarium/skull base: No focal marrow replacing lesion suggestive of neoplasm. -Orbits: Grossly unremarkable. -Paranasal sinuses: Imaged portions clear. -Brain: No acute abnormalities such as hemorrhage, hydrocephalus, acute ischemia, or evidence of a mass lesion.  There is patchy bilateral cerebral white matter T2 and flair signal hyper intensities.  Centimeter or smaller CSF intensity   Mobitz (type) I (Wenckebach's) atrioventricular block    a. 01/2013 - asymptomatic.   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   TIA (transient ischemic attack) 2014   pt stated she had "mini strokes"   Vitamin D deficiency 04/22/2014    Past Surgical History:  Procedure Laterality Date  ABDOMINAL HYSTERECTOMY     LEFT AND RIGHT HEART CATHETERIZATION WITH CORONARY ANGIOGRAM N/A 05/24/2013   Procedure: LEFT AND RIGHT HEART CATHETERIZATION WITH CORONARY ANGIOGRAM;  Surgeon: Sinclair Grooms, MD;  Location: [facility] CATH LAB;  Service: Cardiovascular;  Laterality: N/A;   LEFT HEART CATH N/A 02/01/2020   Procedure: Left Heart Cath;  Surgeon: Jolaine Artist, MD;  Location: [place] CV LAB;  Service: Cardiovascular;  Laterality: N/A;   RIGHT HEART CATH  N/A 02/01/2020   Procedure: RIGHT HEART CATH;  Surgeon: Jolaine Artist, MD;  Location: [place] CV LAB;  Service: Cardiovascular;  Laterality: N/A;    Current Medications: Current Meds  Medication Sig   amLODipine (NORVASC) 5 MG tablet Take 1 tablet (5 mg total) by mouth daily.   aspirin 81 MG EC tablet Take 81 mg by mouth daily.   Calcium Carb-Cholecalciferol (CALCIUM 500 +D) 500-400 MG-UNIT TABS Take 1 tablet by mouth 2 (two) times daily.   furosemide (LASIX) 40 MG tablet Take 1 tablet (40 mg total) by mouth See admin instructions AND 1.5 tablets (60 mg total) See admin instructions. Take 60 mg (1 and 1.5 tablets)  and 40 mg (1 tablet) on alternating days..   hydrALAZINE (APRESOLINE) 50 MG tablet Take 1 tablet (50 mg total) by mouth 3 (three) times daily.   JARDIANCE 10 MG TABS tablet TAKE 1 TABLET BY MOUTH DAILY  BEFORE BREAKFAST   lidocaine (LIDODERM) 5 % Place 1 patch onto the skin daily. Remove & Discard patch within 12 hours or as directed by MD   Liniments (BLUE-EMU SUPER STRENGTH EX) Apply 1 application topically in the morning and at bedtime.   pantoprazole (PROTONIX) 40 MG tablet TAKE 1 TABLET BY MOUTH  DAILY   PHENobarbital (LUMINAL) 64.8 MG tablet Take 2 tablets (129.6 mg total) by mouth at bedtime.   potassium chloride SA (KLOR-CON M) 20 MEQ tablet Take 20 mEq by mouth daily.   rosuvastatin (CRESTOR) 20 MG tablet TAKE 1 TABLET BY MOUTH  DAILY   zonisamide (ZONEGRAN) 100 MG capsule TAKE 2 CAPSULES BY MOUTH AT NIGHT     Allergies:   Ibuprofen   Social History   Socioeconomic History   Marital status: Married    Spouse name: Not on file   Number of children: 2   Years of education: 4   Highest education level: Not on file  Occupational History   Occupation: retired  Tobacco Use   Smoking status: Former    Packs/day: 0.50    Years: 10.00    Total pack years: 5.00    Types: Cigarettes    Quit date: 06/03/2000    Years since quitting: 21.4   Smokeless tobacco:  Never  Vaping Use   Vaping Use: Never used  Substance and Sexual Activity   Alcohol use: No    Alcohol/week: 0.0 standard drinks of alcohol   Drug use: No   Sexual activity: Not Currently    Partners: Male  Other Topics Concern   Not on file  Social History Narrative   Current Social History 06/20/2020        Patient lives with spouse in a home which is 1 story. There are not steps up to the entrance, the patient uses a ramp.       Patient's method of transportation is [employer].      The highest level of education was some high school.      The patient currently disabled.  Identified important Relationships are "God and my husband"       Pets : None       Interests / Fun: "Sitting on my front porch"       Current Stressors: None              Social Determinants of Health   Financial Resource Strain: Not on file  Food Insecurity: No Food Insecurity (10/17/2021)   Hunger Vital Sign    Worried About Running Out of Food in the Last Year: Never true    Ran Out of Food in the Last Year: Never true  Transportation Needs: No Transportation Needs (10/17/2021)   PRAPARE - Hydrologist (Medical): No    Lack of Transportation (Non-Medical): No  Physical Activity: Inactive (10/17/2021)   Exercise Vital Sign    Days of Exercise per Week: 0 days    Minutes of Exercise per Session: 0 min  Stress: No Stress Concern Present (10/17/2021)   [place]    Feeling of Stress : Only a little  Social Connections: Not on file     Family History: The patient's family history includes Asthma in her sister; Cancer in her father; Heart disease in her mother, sister, and sister; Hypertension in her mother and another family member; Obesity in her son.  ROS:   Please see the history of present illness.    Positive for generalized weakness, leg weakness, all other systems reviewed and are  negative.  EKGs/Labs/Other Studies Reviewed:    The following studies were reviewed today: Today's echocardiogram is personally reviewed.  The formal interpretation is pending.  The patient's echo shows severe concentric LVH, mild to moderate circumferential pericardial effusion, severe calcification and restriction of the aortic valve leaflets.  The highest mean transaortic gradient measured is 33 mmHg.  Most of the gradients ranged from 24 to 33 mmHg.  The dimensionless index is 0.27 at its worst.  The mitral valve leaflets are also thickened with annular calcium noted.    EKG:  EKG is not ordered today.   Recent Labs: 06/15/2021: Hemoglobin 11.4; Platelet Count 195 07/26/2021: ALT 28 10/05/2021: B Natriuretic Peptide 370.2 10/31/2021: BUN 66; Creatinine, Ser 2.11; Potassium 4.1; Sodium 140  Recent Lipid Panel    Component Value Date/Time   CHOL 219 (H) 07/26/2021 1213   CHOL 179 01/26/2021 0735   TRIG 58 07/26/2021 1213   HDL 88 07/26/2021 1213   HDL 64 01/26/2021 0735   CHOLHDL 2.5 07/26/2021 1213   VLDL 12 07/26/2021 1213   LDLCALC 119 (H) 07/26/2021 1213   LDLCALC 100 (H) 01/26/2021 0735     Risk Assessment/Calculations:             Physical Exam:    VS:  BP (!) 180/90   Pulse 95   Ht 5\' 1"  (1.549 m)   Wt 134 lb 12.8 oz (61.1 kg)   SpO2 96%   BMI 25.47 kg/m     Wt Readings from Last 3 Encounters:  11/02/21 134 lb 12.8 oz (61.1 kg)  10/31/21 143 lb 3.2 oz (65 kg)  10/17/21 146 lb 14.4 oz (66.6 kg)     GEN: Elderly appearing woman in no acute distress HEENT: Normal NECK: No JVD; No carotid bruits LYMPHATICS: No lymphadenopathy CARDIAC: RRR, 3/6 harsh crescendo decrescendo murmur at the right upper sternal border with diminished A2 RESPIRATORY:  Clear to auscultation without rales, wheezing or rhonchi  ABDOMEN:  Soft, non-tender, non-distended MUSCULOSKELETAL: 1+ bilateral pretibial edema; No deformity  SKIN: Warm and dry NEUROLOGIC:  Alert and oriented x  3 PSYCHIATRIC:  Normal affect   ASSESSMENT:    1. Stage 4 chronic kidney disease ([place])   2. First degree AV block   3. Coronary artery disease involving native coronary artery of native heart without angina pectoris   4. Chronic diastolic congestive heart failure ([place])   5. Nonrheumatic aortic valve stenosis    PLAN:    In order of problems listed above:  Creatinine trend reviewed with most recent creatinine 2.11 improved from prior of 2.8.  Patient treated with loop diuretic therapy (Lasix 60 mg in the morning alternating with 40 mg every other day) No lightheadedness or syncope, stable now for many years.  Did have some high-grade AV block on previous outpatient telemetry monitoring but has done okay with conservative management. No angina, known to have multivessel CAD Clinically stable, minimally symptomatic because of her low functional capacity, followed by the advanced heart failure clinic.  Her echo is really impressive with a marked degree of LVH and pericardial effusion.  She has tested negative for amyloid but I suspect she has some type of infiltrative disorder that we are unable to characterize.  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 The patient has significant aortic stenosis.  She probably has severe paradoxical low-flow low gradient aortic stenosis.  See description of echo above.  I have recommended ongoing surveillance.  The patient has no symptoms because of low functional capacity.  She is essentially scooter/wheelchair bound and only able to stand for very short periods of time.  I think if we tried to move forward with TAVR, we would expose her to a variety of potential complications due to her underlying comorbid conditions.  She would likely need coronary revascularization to safely do the procedure which would carry a high risk of kidney injury and permanent pacemaker in the setting of her stage IV chronic kidney disease and known underlying AV block.  I  will see her back in 6 months for follow-up evaluation.  With her lack of symptoms, she is in agreement for conservative management as well.      Medication Adjustments/Labs and Tests Ordered: Current medicines are reviewed at length with the patient today.  Concerns regarding medicines are outlined above.  No orders of the defined types were placed in this encounter.  No orders of the defined types were placed in this encounter.   Patient Instructions  Medication Instructions:  Your physician recommends that you continue on your current medications as directed. Please refer to the Current Medication list given to you today.  *If you need a refill on your cardiac medications before your next appointment, please call your pharmacy*   Lab Work: NONE If you have labs (blood work) drawn today and your tests are completely normal, you will receive your results only by: [place] (if you have MyChart) OR A paper copy in the mail If you have any lab test that is abnormal or we need to change your treatment, we will call you to review the results.   Testing/Procedures: NONE   Follow-Up: At [facility], you and your health needs are our priority.  As part of our continuing mission to provide you with exceptional heart care, we have created designated Provider Care Teams.  These Care Teams include your primary Cardiologist (physician) and Advanced Practice Providers (APPs -  Physician Assistants and Nurse  Practitioners) who all work together to provide you with the care you need, when you need it.  Your next appointment:   6 month(s)  The format for your next appointment:   In Person  Provider:   Sherren Mocha, MD       Important Information About Sugar         Signed, Sherren Mocha, MD  11/02/2021 1:32 PM    [place]

## 2021-11-02 NOTE — Patient Instructions (Signed)
Medication Instructions:  Your physician recommends that you continue on your current medications as directed. Please refer to the Current Medication list given to you today.  *If you need a refill on your cardiac medications before your next appointment, please call your pharmacy*   Lab Work: NONE If you have labs (blood work) drawn today and your tests are completely normal, you will receive your results only by: MyChart Message (if you have MyChart) OR A paper copy in the mail If you have any lab test that is abnormal or we need to change your treatment, we will call you to review the results.   Testing/Procedures: NONE   Follow-Up: At [facility], you and your health needs are our priority.  As part of our continuing mission to provide you with exceptional heart care, we have created designated Provider Care Teams.  These Care Teams include your primary Cardiologist (physician) and Advanced Practice Providers (APPs -  Physician Assistants and Nurse Practitioners) who all work together to provide you with the care you need, when you need it.  Your next appointment:   6 months  The format for your next appointment:   In Person  Provider:   Michael Cooper, MD       Important Information About Sugar       

## 2021-11-02 NOTE — Assessment & Plan Note (Addendum)
Pt has HTN. She is on hydralazine 50 mg TID, lasix 60 and 40 mg alternating, Jardiance 10 mg qd, Amlodipine 5 mg qd. BP in the clinic 172/63. Husband states bp better controlled at home with readings in 130s-140s/80s. Last creatinine was 2.82 in 10/05/21. Sees [facility]A. BMP shows improvement in renal fxn with 2.11. Patient unsure if she has started amlodipine recommended at last visit but is overwhelmed with all medications and appointments. Plan to continue current medications right now and place a home nursing order to help patient with medications and ensure patient is taking the prescribed medications. Will follow up in 3-4 weeks.  -Continue current meds -Place home health nursing referral -Follow up in 3-4 weeks -Continue to follow with cardiology

## 2021-11-02 NOTE — Assessment & Plan Note (Signed)
Patient has CKDIV. Creatinine with improvement from 2.8 to 2.1. Nephrology suspects cardiorenal. US renal shows medical renal disease. Plan to continue current therapy and control her blood pressure.  -Continue current regimen -Continue to follow with [facility]

## 2021-11-02 NOTE — Assessment & Plan Note (Signed)
Prolia was refilled and dispense date was shown for 10/19/2021 from [facility] but patient states she did not receive it. She was to bring her Prolia to today's visit and allow the nursing staff to give her the injection. Patient states she will look for this medication but doesn't remember receiving it. For this reason, I believe patient will benefit from [facility] nursing. Will reach out to our nursing staff and pharmacy to see if we can get this medication for this patient.

## 2021-11-04 NOTE — Progress Notes (Signed)
  Subjective:  Patient ID: Sherry [name], female    DOB: 05-22-1945,  MRN: [id number]  Chief Complaint  Patient presents with   Nail Problem    Nail fungus-Right hallux nail came off about a month ago.  Nail Trim     76 y.o. female presents with the above complaint. History confirmed with patient.   Objective:  Physical Exam: warm, good capillary refill, no trophic changes or ulcerative lesions, normal DP and PT pulses, and normal sensory exam. Left Foot: dystrophic yellowed discolored nail plates with subungual debris Right Foot: dystrophic yellowed discolored nail plates with subungual debris and right hallux nail has been avulsed, healing well appears to be growing back normally  Assessment:   1. Pain due to onychomycosis of toenails of both feet      Plan:  Patient was evaluated and treated and all questions answered.  Discussed the etiology and treatment options for the condition in detail with the patient. Educated patient on the topical and oral treatment options for mycotic nails. Recommended debridement of the nails today. Sharp and mechanical debridement performed of all painful and mycotic nails today. Nails debrided in length and thickness using a nail nipper to level of comfort. Discussed treatment options including appropriate shoe gear. Follow up as needed for painful nails.    Return in about 3 months (around 01/31/2022) for painful nails .

## 2021-11-05 NOTE — Progress Notes (Signed)
Internal Medicine Clinic Attending  Case discussed with the resident at the time of the visit.  We reviewed the resident's history and exam and pertinent patient test results.  I agree with the assessment, diagnosis, and plan of care documented in the resident's note.  

## 2021-11-08 NOTE — Progress Notes (Signed)
I reviewed the AWV findings. I was present in the office suite and immediately available to provide assistance and direction throughout the time the service was provided.

## 2021-11-20 ENCOUNTER — Ambulatory Visit (INDEPENDENT_AMBULATORY_CARE_PROVIDER_SITE_OTHER): Payer: Medicare Other | Admitting: Neurology

## 2021-11-20 DIAGNOSIS — G629 Polyneuropathy, unspecified: Secondary | ICD-10-CM

## 2021-11-20 DIAGNOSIS — R29898 Other symptoms and signs involving the musculoskeletal system: Secondary | ICD-10-CM

## 2021-11-20 NOTE — Procedures (Signed)
  [facility]  [place], [place]  [place], [place] [address] Tel: [phone] Fax: [phone] Test Date:  11/20/2021  Patient: Sherry [name] DOB: Dec 22, 1945 Physician: Narda Amber, DO  Sex: Female Height: 5\' 1"  Ref Phys: Ellouise Newer, MD  ID#: [id number]   Technician:    History: This is a 76 year old female referred for evaluation of left foot weakness.  NCV & EMG Findings: Extensive electrodiagnostic testing of the left lower extremity shows:  Left sural and superficial peroneal sensory responses are absent. Left peroneal motor response at the extensor digitorum brevis is reduced, and normal at the tibialis anterior.  Motor response shows reduced amplitude (2.7 mV). Chronic motor axonal loss changes are seen affecting L3-S1 myotome, without active denervation.  Impression: The electrodiagnostic testing is suggestive of a chronic sensorimotor axonal polyneuropathy affecting the left lower extremity.  A superimposed multilevel lumbosacral radiculopathies affecting the L3-S1 nerve root/segment may also be possible.  Correlate clinically.   ___________________________ Narda Amber, DO    Nerve Conduction Studies   Stim Site NR Peak (ms) Norm Peak (ms) O-P Amp (V) Norm O-P Amp  Left Sup Peroneal Anti Sensory (Ant Lat Mall)  32 C  12 cm *NR  <4.6  >3  Left Sural Anti Sensory (Lat Mall)  32 C  Calf *NR      >3     Stim Site NR Onset (ms) Norm Onset (ms) O-P Amp (mV) Norm O-P Amp Site1 Site2 Delta-0 (ms) Dist (cm) Vel (m/s) Norm Vel (m/s)  Left Peroneal Motor (Ext Dig Brev)  32 C  Ankle    4.4 <6.0 *1.2 >2.5 B Fib Ankle 7.7 31.0 40 >40  B Fib    12.1  0.9  Poplt B Fib 1.7 8.0 47 >40  Poplt    13.8  0.9         Left Peroneal TA Motor (Tib Ant)  32 C  Fib Head    4.5 <4.5 3.6 >3 Poplit Fib Head 1.9 8.0 42 >40  Poplit    5.7 <5.7 3.5         Left Tibial Motor (Abd Hall Brev)  32 C  Ankle    4.7 <6.0 *2.7 >4 Knee Ankle 7.9 35.0 44 >40  Knee     12.6  2.3          Electromyography   Side Muscle Nerve Root Ins.Act Fibs Fasc Recrt Amp Dur Poly Activation Comment  Left PostTibialis Tibial L5, S1 Nml Nml Nml *SMU *1+ *1+ *1+ *2- N/A  Left Gastroc Tibial S1-2 Nml Nml Nml *3- *1+ *1+ *1+ *1- N/A  Left AntTibialis Dp Br Fibular L4-5 Nml Nml Nml *2- *1+ *1+ *1+ *1- N/A  Left GluteusMed SupGluteal L5-S1 Nml Nml Nml *2- *1+ *1+ *1+ *1- N/A  Left RectFemoris Femoral L2-4 Nml Nml Nml *2- *1+ *1+ *1+ *1- N/A      Waveforms:

## 2021-11-27 IMAGING — NM NM SCAN TUMOR LOCALIZE WITH SPECT
2 series · 12 of 12 positions shown · non-contrast
Comparison: none

CLINICAL DATA: HEART FAILURE. CONCERN FOR CARDIAC AMYLOIDOSIS.

EXAM:
NUCLEAR MEDICINE TUMOR LOCALIZATION. PYP CARDIAC AMYLOIDOSIS SCAN
WITH SPECT
TECHNIQUE: Following intravenous administration of radiopharmaceutical,
anterior planar images of the chest were obtained. Regions of
interest were placed on the heart and contralateral chest wall for
quantitative assessment. Additional SPECT imaging of the chest was
obtained.
RADIOPHARMACEUTICALS:  21.4 mCi [id number] PYROPHOSPHATE

[Series 1: spect - (id)_(id)_cor · 4.1mm · 4.14mm/px · 6 of 128 frames shown]
[frame 11/128]
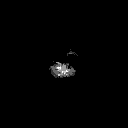
[frame 32/128]
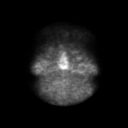
[frame 54/128]
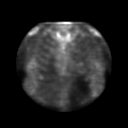
[frame 75/128]
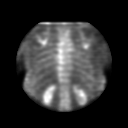
[frame 96/128]
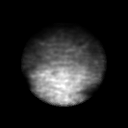
[frame 118/128]
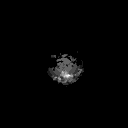

[Series 1: spect - (id)_(id)_tra · 4.1mm · 4.14mm/px · 6 of 128 frames shown]
[frame 11/128]
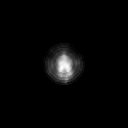
[frame 32/128]
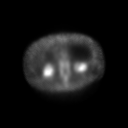
[frame 54/128]
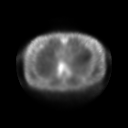
[frame 75/128]
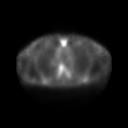
[frame 96/128]
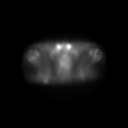
[frame 118/128]
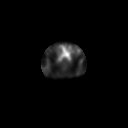

[12 of 12 positions shown; findings below may reference images not displayed]

FINDINGS: Planar Visual assessment:

Anterior planar imaging demonstrates radiotracer uptake within the
heart greater than uptake within the adjacent ribs (Grade 1).

Quantitative assessment :

Quantitative assessment of the cardiac uptake compared to the
contralateral chest wall is equal to 1.07 (H/CL = 1.07).

SPECT assessment: SPECT imaging of the chest demonstrates faint
radiotracer accumulation within the LEFT ventricle.
IMPRESSION: Visual and quantitative assessment (grade 1, H/CLL equal 1.07) are
equivocally suggestive of transthyretin amyloidosis.

## 2021-11-28 ENCOUNTER — Encounter: Payer: Self-pay | Admitting: Internal Medicine

## 2021-11-28 ENCOUNTER — Ambulatory Visit (INDEPENDENT_AMBULATORY_CARE_PROVIDER_SITE_OTHER): Payer: Medicare Other | Admitting: Internal Medicine

## 2021-11-28 ENCOUNTER — Other Ambulatory Visit: Payer: Self-pay

## 2021-11-28 VITALS — BP 161/69 | HR 51 | Temp 98.1°F | Ht 61.0 in | Wt 148.5 lb

## 2021-11-28 DIAGNOSIS — I5032 Chronic diastolic (congestive) heart failure: Secondary | ICD-10-CM

## 2021-11-28 DIAGNOSIS — K219 Gastro-esophageal reflux disease without esophagitis: Secondary | ICD-10-CM

## 2021-11-28 DIAGNOSIS — R569 Unspecified convulsions: Secondary | ICD-10-CM | POA: Diagnosis not present

## 2021-11-28 DIAGNOSIS — E782 Mixed hyperlipidemia: Secondary | ICD-10-CM | POA: Diagnosis not present

## 2021-11-28 DIAGNOSIS — I110 Hypertensive heart disease with heart failure: Secondary | ICD-10-CM | POA: Diagnosis not present

## 2021-11-28 DIAGNOSIS — I440 Atrioventricular block, first degree: Secondary | ICD-10-CM

## 2021-11-28 DIAGNOSIS — M810 Age-related osteoporosis without current pathological fracture: Secondary | ICD-10-CM | POA: Diagnosis not present

## 2021-11-28 DIAGNOSIS — I10 Essential (primary) hypertension: Secondary | ICD-10-CM

## 2021-11-28 DIAGNOSIS — Z87891 Personal history of nicotine dependence: Secondary | ICD-10-CM

## 2021-11-28 DIAGNOSIS — Z23 Encounter for immunization: Secondary | ICD-10-CM | POA: Insufficient documentation

## 2021-11-28 MED ORDER — DENOSUMAB 60 MG/ML ~~LOC~~ SOSY: 60.0000 mg | PREFILLED_SYRINGE | SUBCUTANEOUS | 1 refills | Status: DC

## 2021-11-28 NOTE — Assessment & Plan Note (Signed)
Follows closely with cardiology. Conservative management at this time.

## 2021-11-28 NOTE — Assessment & Plan Note (Signed)
Flu vaccine given today. 

## 2021-11-28 NOTE — Assessment & Plan Note (Signed)
Patient has not been taking Protonix and denies experiencing symptoms of GERD. Plan:I have advised the patient to continue holding this medication unless she begins to experience symptoms again.

## 2021-11-28 NOTE — Assessment & Plan Note (Signed)
Current therapy is phenobarbital, zonisamide. She has had no recent seizures. Plan:Continue current regimen.

## 2021-11-28 NOTE — Assessment & Plan Note (Signed)
BP above goal at 184/71 with recheck of 161/69. Current regimen is amlodipine 5 mg daily, hydralazine 50 mg TID, however Sherry [name] believes that she was told to hold off from taking the amlodipine at her last visit and has not been taking this medication. At home her BP is usually <140/90, and her husband reports the last time he checked her reading was 136/74. Plan:Patient advised to start taking amlodipine 5 mg daily and continue keeping a home BP log.

## 2021-11-28 NOTE — Assessment & Plan Note (Signed)
Continued disconnect between Prolia injection and receipt of the medication. On dispense history I see that last dispense was in September through a [place] [facility], however Sherry [name] is adamant that she did not ever receive this medication in the mail.  Plan:I will try sending this medication in to a local pharmacy for [facility] to pick up and start receiving.

## 2021-11-28 NOTE — Progress Notes (Signed)
CC: routine check up  HPI:  [name] is a 76 y.o. person with past medical history as detailed below who presents for routine check up. Please see problem based charting for detailed assessment and plan.  Past Medical History:  Diagnosis Date   AKI (acute kidney injury) ([place]) 01/31/2020   Anemia    Aortic stenosis    Arthritis    Bilateral lower extremity edema    Bilateral lower extremity edema    Blurry vision, bilateral 05/08/2016   Bradycardia    a. 01/2013 - asymptomatic.   Chronic diastolic heart failure (HCC)    Chronic sinus bradycardia 07/02/2012   CKD (chronic kidney disease)    a. baseline CKD stage III   Coronary artery disease    Epilepsy ([place]) 06/04/2006   EEG 02/19/12 " Interpretation:  This is an abnormal EEG demonstrating continuous and generalized slowing of the background in the theta frequency. Irregular rhythm of EKG noted.  Clinical correlation: the generalized slowing is consistent with a mild encephalopathy of nonspecific etiologies for which the differential would include infectious, toxic, metabolic, inflammatory, and vascular etiologies.    GERD (gastroesophageal reflux disease)    Headache(784.0)    Heart block AV second degree 02/15/2013   Heart murmur    Hyperlipidemia    Hypertension    Hypotension    Hypovolemic shock ([place])    Internal and external hemorrhoids without complication 38/08/5641   Colonoscopy in 2008 recs repeat in 5 years   Intractable focal epilepsy with impairment of consciousness ([place]) 01/02/2012   Late effects of cerebrovascular disease 01/23/2012   MRI Brain -Calvarium/skull base: No focal marrow replacing lesion suggestive of neoplasm. -Orbits: Grossly unremarkable. -Paranasal sinuses: Imaged portions clear. -Brain: No acute abnormalities such as hemorrhage, hydrocephalus, acute ischemia, or evidence of a mass lesion.  There is patchy bilateral cerebral white matter T2 and flair signal hyper intensities.  Centimeter or  smaller CSF intensity   Mobitz (type) I (Wenckebach's) atrioventricular block    a. 01/2013 - asymptomatic.   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   TIA (transient ischemic attack) 2014   pt stated she had "mini strokes"   Vitamin D deficiency 04/22/2014   Review of Systems:  Negative unless otherwise stated.  Physical Exam:  Vitals:   11/28/21 0932 11/28/21 1004  BP: (!) 184/71 (!) 161/69  Pulse: 77 (!) 51  Temp: 98.1 F (36.7 C)   TempSrc: Oral   SpO2: 100%   Weight: 148 lb 8 oz (67.4 kg)   Height: 5\' 1"  (1.549 m)    Constitutional:Appears stated age, comfortably seated in her wheelchair. In no acute distress. Cardio:Regular rate and rhythm. Grade 3/6 crescendo-decrescendo murmur at RUSB. Pulm:Clear to auscultation bilaterally. Normal work of breathing on room air. Abdomen:Soft, nontender, nondistended. PIR:JJOACZYS for extremity edema. Skin:Warm and dry. Neuro:Alert and oriented x3. No focal deficit noted. Psych:Pleasant mood and affect.   Assessment & Plan:   See Encounters Tab for problem based charting.  1St degree AV block Follows closely with cardiology. Conservative management at this time.  Chronic diastolic congestive heart failure (HCC) Current regimen is lasix 60 mg and 40 mg alternating every other day, Jardiance 10 mg daily. Patient denies new edema and does not appear volume overloaded on exam. She follows with the heart failure clinic  as well. Plan:Continue current regimen and regular follow up with the heart failure clinic.  Essential hypertension BP above goal at 184/71 with recheck of 161/69. Current regimen is amlodipine 5 mg daily, hydralazine 50 mg TID, however Sherry [name] believes that she was told to hold off from taking the amlodipine at her last visit and has not been taking this  medication. At home her BP is usually <140/90, and her husband reports the last time he checked her reading was 136/74. Plan:Patient advised to start taking amlodipine 5 mg daily and continue keeping a home BP log.  GERD (gastroesophageal reflux disease) Patient has not been taking Protonix and denies experiencing symptoms of GERD. Plan:I have advised the patient to continue holding this medication unless she begins to experience symptoms again.   Osteoporosis Continued disconnect between Prolia injection and receipt of the medication. On dispense history I see that last dispense was in September through a [facility], however Sherry [name] is adamant that she did not ever receive this medication in the mail.  Plan:I will try sending this medication in to a local pharmacy for [facility] to pick up and start receiving.   Hyperlipidemia Current regimen is rosuvastatin 20 mg daily. Lipid panel checked in June 2023 revealed LDL above goal of <70, at 119. Plan:Continue current regimen. Recheck lipid panel at next OV.  Seizures ([place]) Current therapy is phenobarbital, zonisamide. She has had no recent seizures. Plan:Continue current regimen.  Need for immunization against influenza Flu vaccine given today.  Patient discussed with Dr. Daryll Drown

## 2021-11-28 NOTE — Assessment & Plan Note (Signed)
Current regimen is lasix 60 mg and 40 mg alternating every other day, Jardiance 10 mg daily. Patient denies new edema and does not appear volume overloaded on exam. She follows with the heart failure clinic as well. Plan:Continue current regimen and regular follow up with the heart failure clinic.

## 2021-11-28 NOTE — Assessment & Plan Note (Signed)
Current regimen is rosuvastatin 20 mg daily. Lipid panel checked in June 2023 revealed LDL above goal of <70, at 119. Plan:Continue current regimen. Recheck lipid panel at next OV.

## 2021-11-28 NOTE — Patient Instructions (Signed)
Sherry [name],  It was a pleasure to care for you today. I'm glad that you are doing well!  I would like you to start taking amlodipine 5 mg daily in addition to your other medicines. I will also inquire about the Prolia which never arrived to your home.  Please return for follow up in about 3 months.  My best, Dr. Marlou Sa

## 2021-11-30 NOTE — Telephone Encounter (Signed)
error 

## 2021-12-03 NOTE — Progress Notes (Signed)
Internal Medicine Clinic Attending  Case discussed with Dr. Dean  at the time of the visit.  We reviewed the resident's history and exam and pertinent patient test results.  I agree with the assessment, diagnosis, and plan of care documented in the resident's note.  

## 2021-12-05 DIAGNOSIS — L608 Other nail disorders: Secondary | ICD-10-CM | POA: Diagnosis not present

## 2021-12-05 DIAGNOSIS — D61818 Other pancytopenia: Secondary | ICD-10-CM | POA: Diagnosis not present

## 2021-12-05 DIAGNOSIS — I350 Nonrheumatic aortic (valve) stenosis: Secondary | ICD-10-CM | POA: Diagnosis not present

## 2021-12-05 DIAGNOSIS — I509 Heart failure, unspecified: Secondary | ICD-10-CM | POA: Diagnosis not present

## 2021-12-05 DIAGNOSIS — N390 Urinary tract infection, site not specified: Secondary | ICD-10-CM | POA: Diagnosis not present

## 2021-12-05 DIAGNOSIS — N184 Chronic kidney disease, stage 4 (severe): Secondary | ICD-10-CM | POA: Diagnosis not present

## 2021-12-12 ENCOUNTER — Ambulatory Visit (HOSPITAL_COMMUNITY)
Admission: RE | Admit: 2021-12-12 | Discharge: 2021-12-12 | Disposition: A | Discharge status: Home / Self Care | Payer: Medicare Other | Source: Ambulatory Visit | Attending: Cardiology | Admitting: Cardiology

## 2021-12-12 ENCOUNTER — Telehealth (HOSPITAL_COMMUNITY): Payer: Self-pay | Admitting: Surgery

## 2021-12-12 VITALS — BP 160/90 | HR 55 | Wt 151.6 lb

## 2021-12-12 DIAGNOSIS — I350 Nonrheumatic aortic (valve) stenosis: Secondary | ICD-10-CM | POA: Diagnosis not present

## 2021-12-12 DIAGNOSIS — I440 Atrioventricular block, first degree: Secondary | ICD-10-CM | POA: Diagnosis not present

## 2021-12-12 DIAGNOSIS — I130 Hypertensive heart and chronic kidney disease with heart failure and stage 1 through stage 4 chronic kidney disease, or unspecified chronic kidney disease: Secondary | ICD-10-CM | POA: Insufficient documentation

## 2021-12-12 DIAGNOSIS — E782 Mixed hyperlipidemia: Secondary | ICD-10-CM

## 2021-12-12 DIAGNOSIS — I3139 Other pericardial effusion (noninflammatory): Secondary | ICD-10-CM | POA: Diagnosis not present

## 2021-12-12 DIAGNOSIS — N184 Chronic kidney disease, stage 4 (severe): Secondary | ICD-10-CM | POA: Insufficient documentation

## 2021-12-12 DIAGNOSIS — I5032 Chronic diastolic (congestive) heart failure: Secondary | ICD-10-CM | POA: Diagnosis not present

## 2021-12-12 DIAGNOSIS — I2510 Atherosclerotic heart disease of native coronary artery without angina pectoris: Secondary | ICD-10-CM | POA: Insufficient documentation

## 2021-12-12 DIAGNOSIS — Z7984 Long term (current) use of oral hypoglycemic drugs: Secondary | ICD-10-CM | POA: Insufficient documentation

## 2021-12-12 DIAGNOSIS — Z79899 Other long term (current) drug therapy: Secondary | ICD-10-CM | POA: Diagnosis not present

## 2021-12-12 DIAGNOSIS — E785 Hyperlipidemia, unspecified: Secondary | ICD-10-CM | POA: Insufficient documentation

## 2021-12-12 DIAGNOSIS — I43 Cardiomyopathy in diseases classified elsewhere: Secondary | ICD-10-CM | POA: Insufficient documentation

## 2021-12-12 LAB — BASIC METABOLIC PANEL
Anion gap: 8 (ref 5–15)
BUN: 70 mg/dL — ABNORMAL HIGH (ref 8–23)
CO2: 25 mmol/L (ref 22–32)
Calcium: 8.8 mg/dL — ABNORMAL LOW (ref 8.9–10.3)
Chloride: 108 mmol/L (ref 98–111)
Creatinine, Ser: 2.53 mg/dL — ABNORMAL HIGH (ref 0.44–1.00)
GFR, Estimated: 19 mL/min — ABNORMAL LOW (ref >60)
Glucose, Bld: 78 mg/dL (ref 70–99)
Potassium: 5.1 mmol/L (ref 3.5–5.1)
Sodium: 141 mmol/L (ref 135–145)

## 2021-12-12 LAB — LIPID PANEL
Cholesterol: 188 mg/dL (ref 0–200)
HDL: 74 mg/dL (ref >40)
LDL Cholesterol: 100 mg/dL — ABNORMAL HIGH (ref 0–99)
Total CHOL/HDL Ratio: 2.5 RATIO
Triglycerides: 72 mg/dL (ref <150)
VLDL: 14 mg/dL (ref 0–40)

## 2021-12-12 LAB — BRAIN NATRIURETIC PEPTIDE: B Natriuretic Peptide: 727.8 pg/mL — ABNORMAL HIGH (ref 0.0–100.0)

## 2021-12-12 MED ORDER — ROSUVASTATIN CALCIUM 20 MG PO TABS: 40.0000 mg | ORAL_TABLET | Freq: Every day | ORAL | 3 refills | Status: DC

## 2021-12-12 MED ORDER — AMLODIPINE BESYLATE 10 MG PO TABS: 10.0000 mg | ORAL_TABLET | Freq: Every day | ORAL | 3 refills | Status: DC

## 2021-12-12 MED ORDER — FUROSEMIDE 20 MG PO TABS: 60.0000 mg | ORAL_TABLET | Freq: Every day | ORAL | 3 refills | Status: DC

## 2021-12-12 NOTE — Patient Instructions (Signed)
INCREASE Lasix to 60mg  daily.  INCREASE Amlodipine to 10mg  daily.  Labs done today, your results will be available in MyChart, we will contact you for abnormal readings.  Repeat blood work in 10 days.  Your physician recommends that you schedule a follow-up appointment in: 1 month  If you have any questions or concerns before your next appointment please send [place] a message through [place] or call our office at [phone].    TO LEAVE A MESSAGE FOR THE NURSE SELECT OPTION 2, PLEASE LEAVE A MESSAGE INCLUDING: YOUR NAME DATE OF BIRTH CALL BACK NUMBER REASON FOR CALL**this is important as we prioritize the call backs  YOU WILL RECEIVE A CALL BACK THE SAME DAY AS LONG AS YOU CALL BEFORE 4:00 PM  At the [facility], you and your health needs are our priority. As part of our continuing mission to provide you with exceptional heart care, we have created designated Provider Care Teams. These Care Teams include your primary Cardiologist (physician) and Advanced Practice Providers (APPs- Physician Assistants and Nurse Practitioners) who all work together to provide you with the care you need, when you need it.   You may see any of the following providers on your designated Care Team at your next follow up: Dr Glori Bickers Dr Loralie Champagne Dr. Roxana Hires, NP Lyda Jester, [place] [facility] [place], [place] Forestine Na, NP Audry Riles, PharmD   Please be sure to bring in all your medications bottles to every appointment.

## 2021-12-12 NOTE — Progress Notes (Signed)
PCP: Idamae Schuller, MD Cardiology: Dr. Burt Knack HF Cardiology: Dr. Aundra Dubin  76 y.o. with history of aortic stenosis, chronic diastolic CHF, and CAD was referred by Dr. Burt Knack for evaluation of CHF.  Based on 12/21 echo and RHC/LHC in 12/21, the patient has moderate aortic stenosis. She had coronary angiography in 4/15 with rather diffuse moderate-severe coronary disease that was managed medically.  Cardiac MRI in 11/19 was concerning for cardiac amyloidosis. However, PYP scan was negative in 11/19.  Workup at that time also did not show a monoclonal protein to suggest AL amyloidosis.    Echo in 11/22 showed EF 60-65%, severe LVH, normal RV size and systolic function, mild MR, paradoxical low flow/low gradient severe aortic stenosis with mean gradient 24 mmHg and AVA 0.88 cm^2, small to moderate pericardial effusion.  PYP scan was repeated in 11/22, grade 1 with H/CL 1.07.  This is unlikely to be TTR cardiac amyloidosis.  Repeat urine immunofixation and myeloma panel were negative as well.  [facility] gene testing negative for hATTR mutation.   She was evaluated by hematology for pancytopenia.  This improved without intervention.   Follow up 07/26/21, BP markedly elevated and given 50 mg hydralazine PO in clinic and hydralazine increased to 50 mg tid.    Echo in 9/23 showed EF 60-65%, severe concentric LVH, normal RV, PASP 43, moderate pericardial effusion without tamponade, mild-moderate MR, moderate mitral stenosis with mean gradient 7 mmHg, severe low flow/low gradient AS mean gradient 30 with AVA 0.95 cm^2, IVC normal.   Today she returns for HF follow up with her husband. She is wheelchair-bound due to arthritis.  She transfers from chair to bed and chair, generally not very active.  She denies chest pain.  No lightheadedness.  No dyspnea at rest or with transfers.  Weight up 9 lbs. BP is elevated.   ECG (personally reviewed): NSR, 1st degree AVB 392 msec, lateral TWIs  Labs (6/23): LDL 119 Labs  (9/23): K 4.1, creatinine 2.11  PMH: 1. CKD stage 3 2. H/o seizure disorder 3. H/o TIA 4. H/o Mobitz type 1 2nd degree AVB 5. HTN 6. CAD: LHC (4/15) with 80% and 50% tandem stenoses in the proximal LAD, 80% mid LCx, 85% dLCx, diffuse mid-distal disease up to 90% in RCA.  Diffuse coronary disease, treated medically.  7. Aortic stenosis: Suspect moderate AS. - Echo (12/21): AoV mean gradient 32, AVA 1.2 cm^2.  - RHC/LHC (12/21): AoV mean gradient 22, AVA 1.47 cm^2.  - Echo (11/22): EF 60-65%, severe LVH, normal RV size and systolic function, mild MR, paradoxical low flow/low gradient severe aortic stenosis with mean gradient 24 mmHg and AVA 0.88 cm^2, small to moderate pericardial effusion.   8. Chronic diastolic CHF:  - Cardiac MRI (11/19): EF 76%, moderate LVH, difficult to null LV myocardium with diffuse subendocardial LGE concerning for possible cardiac amyloidosis, moderate pericardial effusion.  - PYP scan (11/19): grade 0, H/CL 1.08.  - Echo (12/21): EF 60-65%, moderate LVH, normal RV, moderate pericardial effusion, moderate AS with AoV mean gradient 32, AVA 1.2 cm^2.  - Echo (11/22): EF 60-65%, severe LVH, normal RV size and systolic function, mild MR, paradoxical low flow/low gradient severe aortic stenosis with mean gradient 24 mmHg and AVA 0.88 cm^2, small to moderate pericardial effusion.   - PYP scan (11/22): grade 1 with H/CL 1.07.  This is unlikely to be TTR cardiac amyloidosis. - [employer] gene testing negative for hATTR mutation.  - Echo (9/23): EF 60-65%, severe concentric LVH, normal RV,  PASP 43, moderate pericardial effusion without tamponade, mild-moderate MR, moderate mitral stenosis with mean gradient 7 mmHg, severe low flow/low gradient AS mean gradient 30 with AVA 0.95 cm^2, IVC normal.  9. Arthritis: Severe, limited to wheelchair.  10. Hyperlipidemia  Social History   Socioeconomic History   Marital status: Married    Spouse name: Not on file   Number of children: 2    Years of education: 4   Highest education level: Not on file  Occupational History   Occupation: retired  Tobacco Use   Smoking status: Former    Packs/day: 0.50    Years: 10.00    Total pack years: 5.00    Types: Cigarettes    Quit date: 06/03/2000    Years since quitting: 21.5   Smokeless tobacco: Never  Vaping Use   Vaping Use: Never used  Substance and Sexual Activity   Alcohol use: No    Alcohol/week: 0.0 standard drinks of alcohol   Drug use: No   Sexual activity: Not Currently    Partners: Male  Other Topics Concern   Not on file  Social History Narrative   Current Social History 06/20/2020        Patient lives with spouse in a home which is 1 story. There are not steps up to the entrance, the patient uses a ramp.       Patient's method of transportation is [employer].      The highest level of education was some high school.      The patient currently disabled.      Identified important Relationships are "God and my husband"       Pets : None       Interests / Fun: "Sitting on my front porch"       Current Stressors: None              Social Determinants of Health   Financial Resource Strain: Not on file  Food Insecurity: No Food Insecurity (10/17/2021)   Hunger Vital Sign    Worried About Running Out of Food in the Last Year: Never true    Ran Out of Food in the Last Year: Never true  Transportation Needs: No Transportation Needs (10/17/2021)   PRAPARE - Hydrologist (Medical): No    Lack of Transportation (Non-Medical): No  Physical Activity: Inactive (10/17/2021)   Exercise Vital Sign    Days of Exercise per Week: 0 days    Minutes of Exercise per Session: 0 min  Stress: No Stress Concern Present (10/17/2021)   [place]    Feeling of Stress : Only a little  Social Connections: Not on file  Intimate Partner Violence: Not At Risk (10/17/2021)   Humiliation,  Afraid, Rape, and Kick questionnaire    Fear of Current or Ex-Partner: No    Emotionally Abused: No    Physically Abused: No    Sexually Abused: No   Family History  Problem Relation Age of Onset   Hypertension Other    Heart disease Mother    Hypertension Mother    Cancer Father    Obesity Son    Asthma Sister    Heart disease Sister    Heart disease Sister    ROS: All systems reviewed and negative except as per HPI.   Current Outpatient Medications  Medication Sig Dispense Refill   aspirin 81 MG EC tablet Take 81 mg by mouth daily.  Calcium Carb-Cholecalciferol (CALCIUM 500 +D) 500-400 MG-UNIT TABS Take 1 tablet by mouth 2 (two) times daily. 60 tablet 2   denosumab (PROLIA) 60 MG/ML SOSY injection Inject 60 mg into the skin every 6 (six) months. 1 mL 1   hydrALAZINE (APRESOLINE) 50 MG tablet Take 1 tablet (50 mg total) by mouth 3 (three) times daily. 270 tablet 3   JARDIANCE 10 MG TABS tablet TAKE 1 TABLET BY MOUTH DAILY  BEFORE BREAKFAST 100 tablet 2   lidocaine (LIDODERM) 5 % Place 1 patch onto the skin daily. Remove & Discard patch within 12 hours or as directed by MD 30 patch 0   Liniments (BLUE-EMU SUPER STRENGTH EX) Apply 1 application topically in the morning and at bedtime.     pantoprazole (PROTONIX) 40 MG tablet TAKE 1 TABLET BY MOUTH  DAILY 100 tablet 2   PHENobarbital (LUMINAL) 64.8 MG tablet Take 2 tablets (129.6 mg total) by mouth at bedtime. 180 tablet 3   zonisamide (ZONEGRAN) 100 MG capsule TAKE 2 CAPSULES BY MOUTH AT NIGHT 180 capsule 3   amLODipine (NORVASC) 10 MG tablet Take 1 tablet (10 mg total) by mouth daily. 30 tablet 3   furosemide (LASIX) 20 MG tablet Take 3 tablets (60 mg total) by mouth daily. 200 tablet 3   rosuvastatin (CRESTOR) 20 MG tablet Take 2 tablets (40 mg total) by mouth daily. 60 tablet 3   No current facility-administered medications for this encounter.   Wt Readings from Last 3 Encounters:  12/12/21 68.8 kg (151 lb 9.6 oz)   11/28/21 67.4 kg (148 lb 8 oz)  11/02/21 61.1 kg (134 lb 12.8 oz)   BP (!) 160/90   Pulse (!) 55   Wt 68.8 kg (151 lb 9.6 oz)   SpO2 96%   BMI 28.64 kg/m  General: NAD Neck: JVP 8-9 cm, no thyromegaly or thyroid nodule.  Lungs: Clear to auscultation bilaterally with normal respiratory effort. CV: Nondisplaced PMI.  Heart regular S1/S2, no S3/S4, 3/6 SEM RUSB with muffled S2.  1+ ankle edema.  No carotid bruit.  Normal pedal pulses.  Abdomen: Soft, nontender, no hepatosplenomegaly, no distention.  Skin: Intact without lesions or rashes.  Neurologic: Alert and oriented x 3.  Psych: Normal affect. Extremities: No clubbing or cyanosis.  HEENT: Normal.   Assessment/Plan 1. Aortic stenosis: 12/21 LHC/RHC and echo were suggestive of moderate aortic stenosis.  Repeat echo in 11/22 showed paradoxical low flow/low gradient severe aortic stenosis, echo in 9/23 was similar.  She was seen by Dr. Burt Knack for TAVR evaluation.  She is both minimally active and minimally symptomatic. She has a long 1st degree AVB.  TAVR could be done, but with minimal activity/symptoms and high risk for progressive heart block/PPM placement as well as worsening of her renal function, we have elected to hold off for now.  2. CAD: Moderate-severe diffuse CAD on coronary angiography in 4/15.  She does not have anginal symptoms but is also minimally active.  She would not be a candidate for CABG due to lack of mobility. No chest pain.  - Continue ASA 81.  - Continue Crestor. Check lipids today, goal LDL < 55.  - If TAVR considered in future, would need coronary angiography.  3. CKD stage IV: Baseline creatinine around 2.1 now. - BMET today.  - Has been referred to Nephrology. 4. Chronic diastolic CHF: Cardiac MRI (see above) was concerning for cardiac amyloidosis (11/19), as was echo with moderate LVH and aortic stenosis. The aortic stenosis and  the pericardial effusion seen by echo are also frequently seen with cardiac  amyloidosis. She additionally has tingling/numbness in hands/feet that is consistent with peripheral neuropathy.  However, PYP scan in 11/19 was negative and myeloma workup was negative.  Repeat PYP scan in 11/22 was again negative and she did not have a transthyretin gene mutation on [employer] gene testing.  Myeloma workup was again negative in 10/22.  Therefore, cardiac amyloidosis seems unlikely and LVH may be due to HTN and aortic stenosis though delayed enhancement from MRI is more suggestive of infiltrative disease. Most recent echo in 9/23 showed EF 60-65%, severe concentric LVH, normal RV, PASP 43, moderate pericardial effusion without tamponade, mild-moderate MR, moderate mitral stenosis with mean gradient 7 mmHg, severe low flow/low gradient AS mean gradient 30 with AVA 0.95 cm^2, IVC normal.  She is mildly volume overloaded on exam today. NYHA II-III, functional status difficult due to physical deconditioning. - Increase Lasix to 60 mg daily with BMET/BNP today and again in 10 days.  - Continue Jardiance 10 mg daily (no recent UTI or yeast infection).  - No ARB/ARNi or spiro with CKD - Continue compression stockings. 5. HTN: BP elevated today.  - Continue hydralazine 50 mg tid. - Increase amlodipine to 10 mg daily.  6. Conduction abnormality: Long 1st degree AVB 392 msec.   - Avoid beta blockade.  - High risk for progressive HB with TAVR, would likely end up with PPM.  7. Hyperlipidemia: Continue Crestor, check lipids today.   Follow up in 1 month with APP.  Loralie Champagne  12/12/2021

## 2021-12-12 NOTE — Telephone Encounter (Signed)
Patient called to review results and recommendations per provider.  I have updated medlist in [facility] and she will return in Dec for repeat labwork.

## 2021-12-12 NOTE — Telephone Encounter (Signed)
-----   Message from Larey Dresser, MD sent at 12/12/2021  2:40 PM EDT ----- 1. Stop KCl.  2. Increase Crestor to 40 mg daily with lipids/LFTs in 2 months.

## 2021-12-24 ENCOUNTER — Ambulatory Visit (HOSPITAL_COMMUNITY)
Admission: RE | Admit: 2021-12-24 | Discharge: 2021-12-24 | Disposition: A | Discharge status: Home / Self Care | Payer: Medicare Other | Source: Ambulatory Visit | Attending: Cardiology | Admitting: Cardiology

## 2021-12-24 DIAGNOSIS — I5032 Chronic diastolic (congestive) heart failure: Secondary | ICD-10-CM | POA: Insufficient documentation

## 2021-12-24 LAB — BASIC METABOLIC PANEL
Anion gap: 10 (ref 5–15)
BUN: 74 mg/dL — ABNORMAL HIGH (ref 8–23)
CO2: 23 mmol/L (ref 22–32)
Calcium: 7.8 mg/dL — ABNORMAL LOW (ref 8.9–10.3)
Chloride: 107 mmol/L (ref 98–111)
Creatinine, Ser: 2.29 mg/dL — ABNORMAL HIGH (ref 0.44–1.00)
GFR, Estimated: 22 mL/min — ABNORMAL LOW (ref >60)
Glucose, Bld: 78 mg/dL (ref 70–99)
Potassium: 3.3 mmol/L — ABNORMAL LOW (ref 3.5–5.1)
Sodium: 140 mmol/L (ref 135–145)

## 2022-01-14 ENCOUNTER — Ambulatory Visit (HOSPITAL_COMMUNITY)
Admission: RE | Admit: 2022-01-14 | Discharge: 2022-01-14 | Disposition: A | Discharge status: Home / Self Care | Payer: Medicare Other | Source: Ambulatory Visit | Attending: Family Medicine | Admitting: Family Medicine

## 2022-01-14 ENCOUNTER — Encounter (HOSPITAL_COMMUNITY): Payer: Self-pay

## 2022-01-14 VITALS — BP 172/80 | HR 66 | Wt 133.6 lb

## 2022-01-14 DIAGNOSIS — Z7984 Long term (current) use of oral hypoglycemic drugs: Secondary | ICD-10-CM | POA: Diagnosis not present

## 2022-01-14 DIAGNOSIS — N184 Chronic kidney disease, stage 4 (severe): Secondary | ICD-10-CM | POA: Insufficient documentation

## 2022-01-14 DIAGNOSIS — I350 Nonrheumatic aortic (valve) stenosis: Secondary | ICD-10-CM | POA: Insufficient documentation

## 2022-01-14 DIAGNOSIS — I3139 Other pericardial effusion (noninflammatory): Secondary | ICD-10-CM | POA: Diagnosis not present

## 2022-01-14 DIAGNOSIS — I130 Hypertensive heart and chronic kidney disease with heart failure and stage 1 through stage 4 chronic kidney disease, or unspecified chronic kidney disease: Secondary | ICD-10-CM | POA: Insufficient documentation

## 2022-01-14 DIAGNOSIS — I440 Atrioventricular block, first degree: Secondary | ICD-10-CM | POA: Diagnosis not present

## 2022-01-14 DIAGNOSIS — I5032 Chronic diastolic (congestive) heart failure: Secondary | ICD-10-CM | POA: Insufficient documentation

## 2022-01-14 DIAGNOSIS — E782 Mixed hyperlipidemia: Secondary | ICD-10-CM | POA: Diagnosis not present

## 2022-01-14 DIAGNOSIS — R202 Paresthesia of skin: Secondary | ICD-10-CM | POA: Diagnosis not present

## 2022-01-14 DIAGNOSIS — I2510 Atherosclerotic heart disease of native coronary artery without angina pectoris: Secondary | ICD-10-CM | POA: Diagnosis not present

## 2022-01-14 DIAGNOSIS — E785 Hyperlipidemia, unspecified: Secondary | ICD-10-CM | POA: Insufficient documentation

## 2022-01-14 DIAGNOSIS — I10 Essential (primary) hypertension: Secondary | ICD-10-CM | POA: Diagnosis not present

## 2022-01-14 DIAGNOSIS — Z8673 Personal history of transient ischemic attack (TIA), and cerebral infarction without residual deficits: Secondary | ICD-10-CM | POA: Diagnosis not present

## 2022-01-14 DIAGNOSIS — Z79899 Other long term (current) drug therapy: Secondary | ICD-10-CM | POA: Insufficient documentation

## 2022-01-14 DIAGNOSIS — R200 Anesthesia of skin: Secondary | ICD-10-CM | POA: Diagnosis not present

## 2022-01-14 LAB — BASIC METABOLIC PANEL
Anion gap: 15 (ref 5–15)
BUN: 75 mg/dL — ABNORMAL HIGH (ref 8–23)
CO2: 24 mmol/L (ref 22–32)
Calcium: 9.6 mg/dL (ref 8.9–10.3)
Chloride: 103 mmol/L (ref 98–111)
Creatinine, Ser: 2.93 mg/dL — ABNORMAL HIGH (ref 0.44–1.00)
GFR, Estimated: 16 mL/min — ABNORMAL LOW (ref >60)
Glucose, Bld: 90 mg/dL (ref 70–99)
Potassium: 4.2 mmol/L (ref 3.5–5.1)
Sodium: 142 mmol/L (ref 135–145)

## 2022-01-14 LAB — BRAIN NATRIURETIC PEPTIDE: B Natriuretic Peptide: 971.4 pg/mL — ABNORMAL HIGH (ref 0.0–100.0)

## 2022-01-14 MED ORDER — HYDRALAZINE HCL 25 MG PO TABS: 75.0000 mg | ORAL_TABLET | Freq: Three times a day (TID) | ORAL | 7 refills | Status: DC

## 2022-01-14 MED ORDER — ROSUVASTATIN CALCIUM 40 MG PO TABS: 40.0000 mg | ORAL_TABLET | Freq: Every day | ORAL | 3 refills | Status: DC

## 2022-01-14 NOTE — Progress Notes (Signed)
PCP: Idamae Schuller, MD Cardiology: Dr. Burt Knack HF Cardiology: Dr. Aundra Dubin  76 y.o. with history of aortic stenosis, chronic diastolic CHF, and CAD was referred by Dr. Burt Knack for evaluation of CHF.  Based on 12/21 echo and RHC/LHC in 12/21, the patient has moderate aortic stenosis. She had coronary angiography in 4/15 with rather diffuse moderate-severe coronary disease that was managed medically.  Cardiac MRI in 11/19 was concerning for cardiac amyloidosis. However, PYP scan was negative in 11/19.  Workup at that time also did not show a monoclonal protein to suggest AL amyloidosis.    Echo in 11/22 showed EF 60-65%, severe LVH, normal RV size and systolic function, mild MR, paradoxical low flow/low gradient severe aortic stenosis with mean gradient 24 mmHg and AVA 0.88 cm^2, small to moderate pericardial effusion.  PYP scan was repeated in 11/22, grade 1 with H/CL 1.07.  This is unlikely to be TTR cardiac amyloidosis.  Repeat urine immunofixation and myeloma panel were negative as well.  [employer] gene testing negative for hATTR mutation.   She was evaluated by hematology for pancytopenia.  This improved without intervention.   Follow up 07/26/21, BP markedly elevated and given 50 mg hydralazine PO in clinic and hydralazine increased to 50 mg tid.    Echo in 9/23 showed EF 60-65%, severe concentric LVH, normal RV, PASP 43, moderate pericardial effusion without tamponade, mild-moderate MR, moderate mitral stenosis with mean gradient 7 mmHg, severe low flow/low gradient AS mean gradient 30 with AVA 0.95 cm^2, IVC normal.   Follow up 10/23, NYHA II-III and mildly volume overloaded, Lasix increased to 60 daily.   Today she returns for HF follow up. Overall feeling well. She is able to clean her kitchen without dyspnea. She is mostly wheelchair bound due to painful arthritis. No dyspnea with ADLs or transfers out of her wheelchair. Denies palpitations, abnormal bleeding, CP, dizziness, edema, or  PND/Orthopnea. Appetite ok. No fever or chills. Weight at home 140 pounds. Taking all medications. BP at home 135-140/80-88.  ECG (personally reviewed): none ordered today.  Labs (6/23): LDL 119 Labs (9/23): K 4.1, creatinine 2.11 Labs (11/23): K 3.3, creatinine 2.29  PMH: 1. CKD stage 3 2. H/o seizure disorder 3. H/o TIA 4. H/o Mobitz type 1 2nd degree AVB 5. HTN 6. CAD: LHC (4/15) with 80% and 50% tandem stenoses in the proximal LAD, 80% mid LCx, 85% dLCx, diffuse mid-distal disease up to 90% in RCA.  Diffuse coronary disease, treated medically.  7. Aortic stenosis: Suspect moderate AS. - Echo (12/21): AoV mean gradient 32, AVA 1.2 cm^2.  - RHC/LHC (12/21): AoV mean gradient 22, AVA 1.47 cm^2.  - Echo (11/22): EF 60-65%, severe LVH, normal RV size and systolic function, mild MR, paradoxical low flow/low gradient severe aortic stenosis with mean gradient 24 mmHg and AVA 0.88 cm^2, small to moderate pericardial effusion.   8. Chronic diastolic CHF:  - Cardiac MRI (11/19): EF 76%, moderate LVH, difficult to null LV myocardium with diffuse subendocardial LGE concerning for possible cardiac amyloidosis, moderate pericardial effusion.  - PYP scan (11/19): grade 0, H/CL 1.08.  - Echo (12/21): EF 60-65%, moderate LVH, normal RV, moderate pericardial effusion, moderate AS with AoV mean gradient 32, AVA 1.2 cm^2.  - Echo (11/22): EF 60-65%, severe LVH, normal RV size and systolic function, mild MR, paradoxical low flow/low gradient severe aortic stenosis with mean gradient 24 mmHg and AVA 0.88 cm^2, small to moderate pericardial effusion.   - PYP scan (11/22): grade 1 with H/CL 1.07.  This is unlikely to be TTR cardiac amyloidosis. - [employer] gene testing negative for hATTR mutation.  - Echo (9/23): EF 60-65%, severe concentric LVH, normal RV, PASP 43, moderate pericardial effusion without tamponade, mild-moderate MR, moderate mitral stenosis with mean gradient 7 mmHg, severe low flow/low gradient  AS mean gradient 30 with AVA 0.95 cm^2, IVC normal.  9. Arthritis: Severe, limited to wheelchair.  10. Hyperlipidemia  Social History   Socioeconomic History   Marital status: Married    Spouse name: Not on file   Number of children: 2   Years of education: 4   Highest education level: Not on file  Occupational History   Occupation: retired  Tobacco Use   Smoking status: Former    Packs/day: 0.50    Years: 10.00    Total pack years: 5.00    Types: Cigarettes    Quit date: 06/03/2000    Years since quitting: 21.6   Smokeless tobacco: Never  Vaping Use   Vaping Use: Never used  Substance and Sexual Activity   Alcohol use: No    Alcohol/week: 0.0 standard drinks of alcohol   Drug use: No   Sexual activity: Not Currently    Partners: Male  Other Topics Concern   Not on file  Social History Narrative   Current Social History 06/20/2020        Patient lives with spouse in a home which is 1 story. There are not steps up to the entrance, the patient uses a ramp.       Patient's method of transportation is [employer].      The highest level of education was some high school.      The patient currently disabled.      Identified important Relationships are "God and my husband"       Pets : None       Interests / Fun: "Sitting on my front porch"       Current Stressors: None              Social Determinants of Health   Financial Resource Strain: Not on file  Food Insecurity: No Food Insecurity (10/17/2021)   Hunger Vital Sign    Worried About Running Out of Food in the Last Year: Never true    Ran Out of Food in the Last Year: Never true  Transportation Needs: No Transportation Needs (10/17/2021)   PRAPARE - Hydrologist (Medical): No    Lack of Transportation (Non-Medical): No  Physical Activity: Inactive (10/17/2021)   Exercise Vital Sign    Days of Exercise per Week: 0 days    Minutes of Exercise per Session: 0 min  Stress: No Stress  Concern Present (10/17/2021)   [place]    Feeling of Stress : Only a little  Social Connections: Not on file  Intimate Partner Violence: Not At Risk (10/17/2021)   Humiliation, Afraid, Rape, and Kick questionnaire    Fear of Current or Ex-Partner: No    Emotionally Abused: No    Physically Abused: No    Sexually Abused: No   Family History  Problem Relation Age of Onset   Hypertension Other    Heart disease Mother    Hypertension Mother    Cancer Father    Obesity Son    Asthma Sister    Heart disease Sister    Heart disease Sister    ROS: All systems reviewed and negative  except as per HPI.   Current Outpatient Medications  Medication Sig Dispense Refill   amLODipine (NORVASC) 10 MG tablet Take 1 tablet (10 mg total) by mouth daily. 30 tablet 3   aspirin 81 MG EC tablet Take 81 mg by mouth daily.     Calcium Carb-Cholecalciferol (CALCIUM 500 +D) 500-400 MG-UNIT TABS Take 1 tablet by mouth 2 (two) times daily. 60 tablet 2   denosumab (PROLIA) 60 MG/ML SOSY injection Inject 60 mg into the skin every 6 (six) months. 1 mL 1   furosemide (LASIX) 20 MG tablet Take 3 tablets (60 mg total) by mouth daily. 200 tablet 3   hydrALAZINE (APRESOLINE) 50 MG tablet Take 1 tablet (50 mg total) by mouth 3 (three) times daily. 270 tablet 3   JARDIANCE 10 MG TABS tablet TAKE 1 TABLET BY MOUTH DAILY  BEFORE BREAKFAST 100 tablet 2   lidocaine (LIDODERM) 5 % Place 1 patch onto the skin daily. Remove & Discard patch within 12 hours or as directed by MD 30 patch 0   Liniments (BLUE-EMU SUPER STRENGTH EX) Apply 1 application topically in the morning and at bedtime.     pantoprazole (PROTONIX) 40 MG tablet TAKE 1 TABLET BY MOUTH  DAILY 100 tablet 2   PHENobarbital (LUMINAL) 64.8 MG tablet Take 2 tablets (129.6 mg total) by mouth at bedtime. 180 tablet 3   rosuvastatin (CRESTOR) 20 MG tablet Take 20 mg by mouth daily.     zonisamide  (ZONEGRAN) 100 MG capsule TAKE 2 CAPSULES BY MOUTH AT NIGHT 180 capsule 3   No current facility-administered medications for this encounter.   Wt Readings from Last 3 Encounters:  01/14/22 60.6 kg (133 lb 9.6 oz)  12/12/21 68.8 kg (151 lb 9.6 oz)  11/28/21 67.4 kg (148 lb 8 oz)   BP (!) 172/80   Pulse 66   Wt 60.6 kg (133 lb 9.6 oz)   SpO2 99%   BMI 25.24 kg/m  Physical Exam General:  NAD. No resp difficulty, arrived in motorized wheelchair HEENT: Normal Neck: Supple. No JVD. Carotids 2+ bilat; no bruits. No lymphadenopathy or thryomegaly appreciated. Cor: PMI nondisplaced. Regular rate & rhythm. No rubs, gallops, 3/6 SEM RUSB, reduced S2 Lungs: Clear Abdomen: Soft, nontender, nondistended. No hepatosplenomegaly. No bruits or masses. Good bowel sounds. Extremities: No cyanosis, clubbing, rash, edema Neuro: Alert & oriented x 3, cranial nerves grossly intact. Moves all 4 extremities w/o difficulty. Affect pleasant.  Assessment/Plan 1. Aortic stenosis: 12/21 LHC/RHC and echo were suggestive of moderate aortic stenosis.  Repeat echo in 11/22 showed paradoxical low flow/low gradient severe aortic stenosis, echo in 9/23 was similar.  She was seen by Dr. Burt Knack for TAVR evaluation.  She is both minimally active and minimally symptomatic. She has a long 1st degree AVB.  TAVR could be done, but with minimal activity/symptoms and high risk for progressive heart block/PPM placement as well as worsening of her renal function, we have elected to hold off for now.  2. CAD: Moderate-severe diffuse CAD on coronary angiography in 4/15.  She does not have anginal symptoms but is also minimally active.  She would not be a candidate for CABG due to lack of mobility. No chest pain.  - Continue ASA 81.  - Recent LDL 100 (10/23), goal LDL < 55. - Increase Crestor to 40 mg daily. Re-check lipids/LFTs in 2 months. - If TAVR considered in future, would need coronary angiography.  3. CKD stage IV: Baseline  creatinine around 2.1 now. -  BMET today.  - Has been referred to Nephrology. 4. Chronic diastolic CHF: Cardiac MRI (see above) was concerning for cardiac amyloidosis (11/19), as was echo with moderate LVH and aortic stenosis. The aortic stenosis and the pericardial effusion seen by echo are also frequently seen with cardiac amyloidosis. She additionally has tingling/numbness in hands/feet that is consistent with peripheral neuropathy.  However, PYP scan in 11/19 was negative and myeloma workup was negative.  Repeat PYP scan in 11/22 was again negative and she did not have a transthyretin gene mutation on [employer] gene testing.  Myeloma workup was again negative in 10/22.  Therefore, cardiac amyloidosis seems unlikely and LVH may be due to HTN and aortic stenosis though delayed enhancement from MRI is more suggestive of infiltrative disease. Most recent echo in 9/23 showed EF 60-65%, severe concentric LVH, normal RV, PASP 43, moderate pericardial effusion without tamponade, mild-moderate MR, moderate mitral stenosis with mean gradient 7 mmHg, severe low flow/low gradient AS mean gradient 30 with AVA 0.95 cm^2, IVC normal. NYHA II-III, functional status difficult due to physical deconditioning. She is not volume overloaded on exam. - Continue Lasix 60 mg daily. BMET today. - Continue Jardiance 10 mg daily (no recent UTI or yeast infection).  - No ARB/ARNi or spiro with CKD - Continue compression stockings. 5. HTN: BP elevated today. Home BP ~ 130s/80s. - Increase hydralazine to 75 mg tid. - Continue amlodipine 10 mg daily.  6. Conduction abnormality: Long 1st degree AVB 392 msec on last ECG.   - Avoid beta blockade.  - High risk for progressive HB with TAVR, would likely end up with PPM.  7. Hyperlipidemia: LDL 100 (10/23).  - Increase Crestor to 40 mg daily. Repeat lipids/LFTs in 2 months.  Follow up in 3-4 months with Dr. Aundra Dubin.  Maricela Bo [facility] FNP-BC 01/14/2022

## 2022-01-14 NOTE — Patient Instructions (Addendum)
Thank you for coming in today  Labs were done today, if any labs are abnormal the clinic will call you No news is good news  INCREASE Crestor to 40 mg 1 tablet daily   INCREASE Hydralazine to 75 mg 1 tablet 3 times daily  Your physician recommends that you schedule a follow-up appointment in:  3-4 months with Dr. Kendall Flack will receive a reminder letter in the mail a few months in advance. If you don't receive a letter, please call our office to schedule the follow-up appointment.     Do the following things EVERYDAY: Weigh yourself in the morning before breakfast. Write it down and keep it in a log. Take your medicines as prescribed Eat low salt foods--Limit salt (sodium) to 2000 mg per day.  Stay as active as you can everyday Limit all fluids for the day to less than 2 liters  At the [facility], you and your health needs are our priority. As part of our continuing mission to provide you with exceptional heart care, we have created designated Provider Care Teams. These Care Teams include your primary Cardiologist (physician) and Advanced Practice Providers (APPs- Physician Assistants and Nurse Practitioners) who all work together to provide you with the care you need, when you need it.   You may see any of the following providers on your designated Care Team at your next follow up: Dr Glori Bickers Dr Loralie Champagne Dr. Roxana Hires, NP Lyda Jester, [place] [facility] [place], [place] Forestine Na, NP Audry Riles, PharmD   Please be sure to bring in all your medications bottles to every appointment.   If you have any questions or concerns before your next appointment please send [place] a message through [place] or call our office at [phone].    TO LEAVE A MESSAGE FOR THE NURSE SELECT OPTION 2, PLEASE LEAVE A MESSAGE INCLUDING: YOUR NAME DATE OF BIRTH CALL BACK NUMBER REASON FOR CALL**this is important as we prioritize the call  backs  YOU WILL RECEIVE A CALL BACK THE SAME DAY AS LONG AS YOU CALL BEFORE 4:00 PM

## 2022-01-15 ENCOUNTER — Telehealth (HOSPITAL_COMMUNITY): Payer: Self-pay | Admitting: Cardiology

## 2022-01-15 DIAGNOSIS — I5032 Chronic diastolic (congestive) heart failure: Secondary | ICD-10-CM

## 2022-01-15 MED ORDER — FUROSEMIDE 20 MG PO TABS: ORAL_TABLET | ORAL | 3 refills | Status: DC

## 2022-01-15 NOTE — Telephone Encounter (Signed)
-----   Message from Rafael Bihari, [place] sent at 01/14/2022  2:48 PM EST ----- SCr elevated. Hold Lasix x 1 day.  Resume Lasix at 60 mg daily alternating with 40 mg every other day.  Repeat BMET in 7-10 days.

## 2022-01-15 NOTE — Telephone Encounter (Signed)
Patient called.  Patient aware.  

## 2022-01-23 ENCOUNTER — Ambulatory Visit (HOSPITAL_COMMUNITY)
Admission: RE | Admit: 2022-01-23 | Discharge: 2022-01-23 | Disposition: A | Discharge status: Home / Self Care | Payer: Medicare Other | Source: Ambulatory Visit | Attending: Internal Medicine | Admitting: Internal Medicine

## 2022-01-23 ENCOUNTER — Telehealth (HOSPITAL_COMMUNITY): Payer: Self-pay | Admitting: *Deleted

## 2022-01-23 DIAGNOSIS — I5032 Chronic diastolic (congestive) heart failure: Secondary | ICD-10-CM

## 2022-01-23 DIAGNOSIS — E876 Hypokalemia: Secondary | ICD-10-CM

## 2022-01-23 LAB — BASIC METABOLIC PANEL WITH GFR
Anion gap: 11 (ref 5–15)
BUN: 70 mg/dL — ABNORMAL HIGH (ref 8–23)
CO2: 22 mmol/L (ref 22–32)
Calcium: 8.4 mg/dL — ABNORMAL LOW (ref 8.9–10.3)
Chloride: 105 mmol/L (ref 98–111)
Creatinine, Ser: 2.62 mg/dL — ABNORMAL HIGH (ref 0.44–1.00)
GFR, Estimated: 18 mL/min — ABNORMAL LOW
Glucose, Bld: 84 mg/dL (ref 70–99)
Potassium: 3.3 mmol/L — ABNORMAL LOW (ref 3.5–5.1)
Sodium: 138 mmol/L (ref 135–145)

## 2022-01-23 MED ORDER — POTASSIUM CHLORIDE ER 10 MEQ PO TBCR: 10.0000 meq | EXTENDED_RELEASE_TABLET | Freq: Every day | ORAL | 0 refills | Status: DC

## 2022-01-23 NOTE — Telephone Encounter (Signed)
Called patient per Sherry Katz, NP with following: 1. Kidney function improving, but K is low.  2. Start KCL 10 meQ daily.  3. We have referred her to Nephrology, please make sure she has follow up.  4. Repeat BMET in 7-10 days.   Patient has potassium at home and will start 10 meq daily. She has lab appointment here 02/14/22 - will add BMP for that day. Patient confirms she has appointment with Nephrology in February and does intend on keeping appointment.

## 2022-02-01 ENCOUNTER — Ambulatory Visit: Payer: Self-pay | Admitting: Licensed Clinical Social Worker

## 2022-02-01 NOTE — Patient Outreach (Signed)
SW removed from Care Team.  Xanthe Couillard, BSW, MSW, LCSW-A  Social Worker [facility] Care Management  [phone] 

## 2022-02-05 ENCOUNTER — Ambulatory Visit (INDEPENDENT_AMBULATORY_CARE_PROVIDER_SITE_OTHER): Payer: Medicare Other | Admitting: Podiatry

## 2022-02-05 DIAGNOSIS — Z91199 Patient's noncompliance with other medical treatment and regimen due to unspecified reason: Secondary | ICD-10-CM

## 2022-02-05 NOTE — Progress Notes (Signed)
1. No-show for appointment     

## 2022-02-14 ENCOUNTER — Ambulatory Visit (HOSPITAL_COMMUNITY)
Admission: RE | Admit: 2022-02-14 | Discharge: 2022-02-14 | Disposition: A | Discharge status: Home / Self Care | Payer: Medicare Other | Source: Ambulatory Visit | Attending: Internal Medicine | Admitting: Internal Medicine

## 2022-02-14 DIAGNOSIS — I5032 Chronic diastolic (congestive) heart failure: Secondary | ICD-10-CM | POA: Insufficient documentation

## 2022-02-14 DIAGNOSIS — E876 Hypokalemia: Secondary | ICD-10-CM | POA: Diagnosis not present

## 2022-02-14 DIAGNOSIS — E782 Mixed hyperlipidemia: Secondary | ICD-10-CM | POA: Diagnosis not present

## 2022-02-14 LAB — BASIC METABOLIC PANEL
Anion gap: 10 (ref 5–15)
BUN: 70 mg/dL — ABNORMAL HIGH (ref 8–23)
CO2: 21 mmol/L — ABNORMAL LOW (ref 22–32)
Calcium: 8.5 mg/dL — ABNORMAL LOW (ref 8.9–10.3)
Chloride: 109 mmol/L (ref 98–111)
Creatinine, Ser: 2.98 mg/dL — ABNORMAL HIGH (ref 0.44–1.00)
GFR, Estimated: 16 mL/min — ABNORMAL LOW (ref >60)
Glucose, Bld: 79 mg/dL (ref 70–99)
Potassium: 3.9 mmol/L (ref 3.5–5.1)
Sodium: 140 mmol/L (ref 135–145)

## 2022-02-14 LAB — LIPID PANEL
Cholesterol: 170 mg/dL (ref 0–200)
HDL: 64 mg/dL (ref >40)
LDL Cholesterol: 92 mg/dL (ref 0–99)
Total CHOL/HDL Ratio: 2.7 RATIO
Triglycerides: 72 mg/dL (ref <150)
VLDL: 14 mg/dL (ref 0–40)

## 2022-02-14 NOTE — Addendum Note (Signed)
Encounter addended by: Jerl Mina, RN on: 02/14/2022 10:41 AM  Actions taken: Visit diagnoses modified, Order list changed, Diagnosis association updated

## 2022-02-15 ENCOUNTER — Telehealth (HOSPITAL_COMMUNITY): Payer: Self-pay

## 2022-02-15 ENCOUNTER — Other Ambulatory Visit (HOSPITAL_COMMUNITY): Payer: Self-pay

## 2022-02-15 DIAGNOSIS — I5032 Chronic diastolic (congestive) heart failure: Secondary | ICD-10-CM

## 2022-02-15 NOTE — Telephone Encounter (Signed)
Patient aware and agreeable. Labs ordered and scheduled. 

## 2022-02-19 ENCOUNTER — Other Ambulatory Visit (HOSPITAL_COMMUNITY): Payer: Self-pay | Admitting: Cardiology

## 2022-02-22 ENCOUNTER — Ambulatory Visit (HOSPITAL_COMMUNITY)
Admission: RE | Admit: 2022-02-22 | Discharge: 2022-02-22 | Disposition: A | Discharge status: Home / Self Care | Payer: Medicare Other | Source: Ambulatory Visit | Attending: Cardiology | Admitting: Cardiology

## 2022-02-22 DIAGNOSIS — I5032 Chronic diastolic (congestive) heart failure: Secondary | ICD-10-CM

## 2022-02-22 LAB — BASIC METABOLIC PANEL
Anion gap: 11 (ref 5–15)
BUN: 86 mg/dL — ABNORMAL HIGH (ref 8–23)
CO2: 18 mmol/L — ABNORMAL LOW (ref 22–32)
Calcium: 8 mg/dL — ABNORMAL LOW (ref 8.9–10.3)
Chloride: 110 mmol/L (ref 98–111)
Creatinine, Ser: 2.53 mg/dL — ABNORMAL HIGH (ref 0.44–1.00)
GFR, Estimated: 19 mL/min — ABNORMAL LOW (ref >60)
Glucose, Bld: 87 mg/dL (ref 70–99)
Potassium: 4.4 mmol/L (ref 3.5–5.1)
Sodium: 139 mmol/L (ref 135–145)

## 2022-02-25 DIAGNOSIS — L609 Nail disorder, unspecified: Secondary | ICD-10-CM | POA: Diagnosis not present

## 2022-02-25 DIAGNOSIS — D649 Anemia, unspecified: Secondary | ICD-10-CM | POA: Diagnosis not present

## 2022-02-25 DIAGNOSIS — M79672 Pain in left foot: Secondary | ICD-10-CM | POA: Diagnosis not present

## 2022-02-25 DIAGNOSIS — M2560 Stiffness of unspecified joint, not elsewhere classified: Secondary | ICD-10-CM | POA: Diagnosis not present

## 2022-02-25 DIAGNOSIS — M2540 Effusion, unspecified joint: Secondary | ICD-10-CM | POA: Diagnosis not present

## 2022-02-25 DIAGNOSIS — M79671 Pain in right foot: Secondary | ICD-10-CM | POA: Diagnosis not present

## 2022-02-25 DIAGNOSIS — R591 Generalized enlarged lymph nodes: Secondary | ICD-10-CM | POA: Diagnosis not present

## 2022-02-25 DIAGNOSIS — R768 Other specified abnormal immunological findings in serum: Secondary | ICD-10-CM | POA: Diagnosis not present

## 2022-02-25 DIAGNOSIS — R5383 Other fatigue: Secondary | ICD-10-CM | POA: Diagnosis not present

## 2022-02-27 ENCOUNTER — Ambulatory Visit (INDEPENDENT_AMBULATORY_CARE_PROVIDER_SITE_OTHER): Payer: Medicare Other | Admitting: Internal Medicine

## 2022-02-27 ENCOUNTER — Encounter: Payer: Self-pay | Admitting: Internal Medicine

## 2022-02-27 VITALS — BP 192/61 | HR 56 | Temp 97.9°F | Wt 144.9 lb

## 2022-02-27 DIAGNOSIS — Z87891 Personal history of nicotine dependence: Secondary | ICD-10-CM | POA: Diagnosis not present

## 2022-02-27 DIAGNOSIS — M810 Age-related osteoporosis without current pathological fracture: Secondary | ICD-10-CM

## 2022-02-27 DIAGNOSIS — I129 Hypertensive chronic kidney disease with stage 1 through stage 4 chronic kidney disease, or unspecified chronic kidney disease: Secondary | ICD-10-CM

## 2022-02-27 DIAGNOSIS — K219 Gastro-esophageal reflux disease without esophagitis: Secondary | ICD-10-CM

## 2022-02-27 DIAGNOSIS — I10 Essential (primary) hypertension: Secondary | ICD-10-CM

## 2022-02-27 DIAGNOSIS — N184 Chronic kidney disease, stage 4 (severe): Secondary | ICD-10-CM

## 2022-02-27 MED ORDER — HYDRALAZINE HCL 100 MG PO TABS: 100.0000 mg | ORAL_TABLET | Freq: Three times a day (TID) | ORAL | 11 refills | Status: DC

## 2022-02-27 NOTE — Progress Notes (Signed)
CC: follow up   HPI:  [name] is a 77 y.o. with medical history of HTN, HLD, Aortic Stenosis, CHF, CKD IV, osteoporosis, hx of CVA presenting to [facility] for PCP follow up.  Please see problem-based list for further details, assessments, and plans.  Past Medical History:  Diagnosis Date   AKI (acute kidney injury) ([place]) 01/31/2020   Anemia    Aortic stenosis    Arthritis    Bilateral lower extremity edema    Bilateral lower extremity edema    Blurry vision, bilateral 05/08/2016   Bradycardia    a. 01/2013 - asymptomatic.   Chronic diastolic heart failure (HCC)    Chronic sinus bradycardia 07/02/2012   CKD (chronic kidney disease)    a. baseline CKD stage III   Coronary artery disease    Epilepsy ([place]) 06/04/2006   EEG 02/19/12 " Interpretation:  This is an abnormal EEG demonstrating continuous and generalized slowing of the background in the theta frequency. Irregular rhythm of EKG noted.  Clinical correlation: the generalized slowing is consistent with a mild encephalopathy of nonspecific etiologies for which the differential would include infectious, toxic, metabolic, inflammatory, and vascular etiologies.    GERD (gastroesophageal reflux disease)    Headache(784.0)    Heart block AV second degree 02/15/2013   Heart murmur    Hyperlipidemia    Hypertension    Hypotension    Hypovolemic shock ([place])    Internal and external hemorrhoids without complication 48/02/8561   Colonoscopy in 2008 recs repeat in 5 years   Intractable focal epilepsy with impairment of consciousness ([place]) 01/02/2012   Late effects of cerebrovascular disease 01/23/2012   MRI Brain -Calvarium/skull base: No focal marrow replacing lesion suggestive of neoplasm. -Orbits: Grossly unremarkable. -Paranasal sinuses: Imaged portions clear. -Brain: No acute abnormalities such as hemorrhage, hydrocephalus, acute ischemia, or evidence of a mass lesion.  There is patchy bilateral cerebral white matter T2 and  flair signal hyper intensities.  Centimeter or smaller CSF intensity   Mobitz (type) I (Wenckebach's) atrioventricular block    a. 01/2013 - asymptomatic.   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   TIA (transient ischemic attack) 2014   pt stated she had "mini strokes"   Vitamin D deficiency 04/22/2014    Current Outpatient Medications (Endocrine & Metabolic):    denosumab (PROLIA) 60 MG/ML SOSY injection, Inject 60 mg into the skin every 6 (six) months.   JARDIANCE 10 MG TABS tablet, TAKE 1 TABLET BY MOUTH DAILY  BEFORE BREAKFAST  Current Outpatient Medications (Cardiovascular):    amLODipine (NORVASC) 10 MG tablet, TAKE 1 TABLET BY MOUTH DAILY   furosemide (LASIX) 20 MG tablet, Take 3 tablets (60 mg total) by mouth daily AND 2 tablets (40 mg total) daily. ALTERNATING.   hydrALAZINE (APRESOLINE) 100 MG tablet, Take 1 tablet (100 mg total) by mouth 3 (three) times daily.   rosuvastatin (CRESTOR) 40 MG tablet, Take 1 tablet (40 mg total) by mouth daily.   Current Outpatient Medications (Analgesics):    aspirin 81 MG EC tablet, Take 81 mg by mouth daily.   Current Outpatient Medications (Other):    Calcium Carb-Cholecalciferol (CALCIUM 500 +D) 500-400 MG-UNIT TABS, Take 1 tablet by mouth 2 (two) times daily.   lidocaine (LIDODERM) 5 %, Place 1 patch onto the skin daily. Remove &  Discard patch within 12 hours or as directed by MD   Liniments (BLUE-EMU SUPER STRENGTH EX), Apply 1 application topically in the morning and at bedtime.   pantoprazole (PROTONIX) 40 MG tablet, TAKE 1 TABLET BY MOUTH  DAILY   PHENobarbital (LUMINAL) 64.8 MG tablet, Take 2 tablets (129.6 mg total) by mouth at bedtime.   potassium chloride (KLOR-CON) 10 MEQ tablet, Take 1 tablet (10 mEq total) by mouth daily.   zonisamide (ZONEGRAN) 100 MG capsule,  TAKE 2 CAPSULES BY MOUTH AT NIGHT  Review of Systems:  Review of system negative unless stated in the problem list or HPI.    Physical Exam:  Vitals:   02/27/22 0959 02/27/22 1031  BP: (!) 202/68 (!) 192/61  Pulse: 100 (!) 56  Temp:  97.9 F (36.6 C)  TempSrc:  Oral  SpO2: 100%   Weight: 144 lb 14.4 oz (65.7 kg)     Physical Exam General: NAD, wheel chaired bound HENT: NCAT Lungs: CTAB, no wheeze, rhonchi or rales.  Cardiovascular: crescendo-descrescendo murmur. No LE edema Abdomen: No TTP, normal bowel sounds MSK: No asymmetry Skin: no lesions noted on exposed skin Neuro: Alert and oriented x4. CN grossly intact Psych: Normal mood and normal affect   Assessment & Plan:   Essential hypertension BP elevated in the clinic at 192/61. Pt reporting lower readings at homes. Pt on Amlodipine 10 mg qd, Hydralazine 75 mg TID, and Lasix 60 mg qd and 40 mg qd on alternating days. She initially stated she was taking one of hydralazine three times daily but then stated she was three capsules TID. Her capsule strength is 25 mg and she takes 3 of them. Given elevated BP will increase her to 100 mg capsule TID and continue amlodipine, and lasix as previously. Will have pt follow up in one week for BP check  Osteoporosis Pt appears to be having trouble obtaining prolia. Have contacted infusion center and they can infuse the medication on 03/07/21.  -Continue Prolia infusion q 6 months.   CKD (chronic kidney disease), stage IV (HCC) Pt's creatinine stable with GFR around 20. Pt has cardiorenal and has close follow up with cardiology and nephrology. Given advancing renal disease and development of cardiorenal syndrome, discussed palliative care consult with the patient. Pt states she will continue to think about this but does not want to speak with them. Advised pt she will be a poor HD candidate given her co-morbidities but will have CKA discuss this with her further.   GERD (gastroesophageal  reflux disease) GERD stable on protonix 40 mg qd. Continue protonix daily.    See Encounters Tab for problem based charting.  Patient discussed with Dr. Oren Binet, MD Tillie Rung. [facility] ([facility]) Internal Medicine Residency, PGY-2  Seizures  Controlled

## 2022-02-27 NOTE — Patient Instructions (Addendum)
Sherry [name], it was a pleasure seeing you today! You endorsed feeling well today. Below are some of the things we talked about this visit. We look forward to seeing you in the follow up appointment!  Today we discussed: For your blood pressure, please take your medicines as indicated. We will increase your hydralazine to 100 mg three times a day.  Get a pill organizer and organize your pills.  We will work on referring you to palliative care that is convenient.   I have ordered the following labs today:  Lab Orders  No laboratory test(s) ordered today      Referrals ordered today:   Referral Orders  No referral(s) requested today     I have ordered the following medication/changed the following medications:   Stop the following medications: Medications Discontinued During This Encounter  Medication Reason   hydrALAZINE (APRESOLINE) 50 MG tablet Change in therapy   hydrALAZINE (APRESOLINE) 25 MG tablet Reorder     Start the following medications: Meds ordered this encounter  Medications   hydrALAZINE (APRESOLINE) 100 MG tablet    Sig: Take 1 tablet (100 mg total) by mouth 3 (three) times daily.    Dispense:  90 tablet    Refill:  11     Follow-up: 1 week follow up  Please make sure to arrive 15 minutes prior to your next appointment. If you arrive late, you may be asked to reschedule.   We look forward to seeing you next time. Please call our clinic at [phone] if you have any questions or concerns. The best time to call is Monday-Friday from 9am-4pm, but there is someone available 24/7. If after hours or the weekend, call the main hospital number and ask for the Internal Medicine Resident On-Call. If you need medication refills, please notify your pharmacy one week in advance and they will send [place] a request.  Thank you for letting us take part in your care. Wishing you the best!  Thank you, Idamae Schuller, MD

## 2022-03-01 ENCOUNTER — Telehealth: Payer: Self-pay | Admitting: *Deleted

## 2022-03-01 NOTE — Telephone Encounter (Signed)
Call to patient to ask about symptoms.  Has some frequency but is on a fluid pill as well.  No fevers.  Has had burning x 3 weeks with a foul odor.

## 2022-03-01 NOTE — Telephone Encounter (Signed)
Patient was called to be given appointment for procedure.  Patient informed caller that she was tld by her Rheumatologist that she has a UTI that needed to be treated.   No information was received from Dr. Marina Gravel office concerning need for treatment.  Unable to reach anyone in the office as it is closed this afternoon. Labs were pilled up that shows an abnormal UTI.

## 2022-03-01 NOTE — Assessment & Plan Note (Signed)
Pt appears to be having trouble obtaining prolia. Have contacted infusion center and they can infuse the medication on 03/07/21.  -Continue Prolia infusion q 6 months.

## 2022-03-01 NOTE — Assessment & Plan Note (Addendum)
Pt's creatinine stable with GFR around 20. Pt has cardiorenal and has close follow up with cardiology and nephrology. Given advancing renal disease and development of cardiorenal syndrome, discussed palliative care consult with the patient. Pt states she will continue to think and would like to speak with someone regarding this further. Advised pt she will be a poor HD candidate given her co-morbidities but will have CKA discuss this with her further.

## 2022-03-01 NOTE — Assessment & Plan Note (Signed)
BP elevated in the clinic at 192/61. Pt reporting lower readings at homes. Pt on Amlodipine 10 mg qd, Hydralazine 75 mg TID, and Lasix 60 mg qd and 40 mg qd on alternating days. She initially stated she was taking one of hydralazine three times daily but then stated she was three capsules TID. Her capsule strength is 25 mg and she takes 3 of them. Given elevated BP will increase her to 100 mg capsule TID and continue amlodipine, and lasix as previously. Will have pt follow up in one week for BP check

## 2022-03-01 NOTE — Assessment & Plan Note (Signed)
GERD stable on protonix 40 mg qd. Continue protonix daily.

## 2022-03-01 NOTE — Telephone Encounter (Signed)
Thank you so much!

## 2022-03-01 NOTE — Telephone Encounter (Signed)
RTC to patient will come in on 03/07/2022 at 10:15 AM after infusion.  Patient was advised that if she has worsening symptoms to go to the Urgent Care over the weekend. Patient voiced understanding of the plan.

## 2022-03-06 NOTE — Progress Notes (Signed)
Internal Medicine Clinic Attending  Case discussed with Dr. Khan  At the time of the visit.  We reviewed the resident's history and exam and pertinent patient test results.  I agree with the assessment, diagnosis, and plan of care documented in the resident's note.  

## 2022-03-07 ENCOUNTER — Ambulatory Visit (HOSPITAL_COMMUNITY)
Admission: RE | Admit: 2022-03-07 | Discharge: 2022-03-07 | Disposition: A | Discharge status: Home / Self Care | Payer: Medicare Other | Source: Ambulatory Visit | Attending: Internal Medicine | Admitting: Internal Medicine

## 2022-03-07 ENCOUNTER — Encounter: Payer: Self-pay | Admitting: Internal Medicine

## 2022-03-07 ENCOUNTER — Ambulatory Visit (INDEPENDENT_AMBULATORY_CARE_PROVIDER_SITE_OTHER): Payer: Medicare Other | Admitting: Internal Medicine

## 2022-03-07 VITALS — BP 189/72 | HR 54 | Temp 98.0°F

## 2022-03-07 DIAGNOSIS — Z87891 Personal history of nicotine dependence: Secondary | ICD-10-CM

## 2022-03-07 DIAGNOSIS — N390 Urinary tract infection, site not specified: Secondary | ICD-10-CM | POA: Diagnosis not present

## 2022-03-07 DIAGNOSIS — M810 Age-related osteoporosis without current pathological fracture: Secondary | ICD-10-CM | POA: Diagnosis not present

## 2022-03-07 DIAGNOSIS — I10 Essential (primary) hypertension: Secondary | ICD-10-CM | POA: Diagnosis not present

## 2022-03-07 HISTORY — DX: Urinary tract infection, site not specified: N39.0

## 2022-03-07 LAB — POCT URINALYSIS DIPSTICK (MANUAL)
Nitrite, UA: POSITIVE — AB
Poct Bilirubin: NEGATIVE
Poct Blood: NEGATIVE
Poct Glucose: NORMAL mg/dL
Poct Ketones: NEGATIVE
Poct Urobilinogen: NORMAL mg/dL
Spec Grav, UA: 1.02 (ref 1.010–1.025)
pH, UA: 5.5 (ref 5.0–8.0)

## 2022-03-07 MED ORDER — DENOSUMAB 60 MG/ML ~~LOC~~ SOSY
60.0000 mg | PREFILLED_SYRINGE | Freq: Once | SUBCUTANEOUS | Status: AC
  Administered 2022-03-07: 60 mg via SUBCUTANEOUS

## 2022-03-07 MED ORDER — NITROFURANTOIN MONOHYD MACRO 100 MG PO CAPS: 100.0000 mg | ORAL_CAPSULE | Freq: Two times a day (BID) | ORAL | 0 refills | Status: AC

## 2022-03-07 MED ORDER — DENOSUMAB 60 MG/ML ~~LOC~~ SOSY
PREFILLED_SYRINGE | SUBCUTANEOUS | Status: AC
  Filled 2022-03-07: qty 1

## 2022-03-07 NOTE — Assessment & Plan Note (Signed)
Patient recently evaluated at rheumatology office. Urine was checked and suggestive of UTI. At the time showed leukocyte esterase and bacteria, nitrite negative. Patient does endorse burning with urination. No suprapubic pain. Also complains of malodorous urine. POC urine dipstick here with leukocyte esterase and nitrites.   UA and symptoms suggestive of UTI. Will treat with short course of macrobid for 3 days.

## 2022-03-07 NOTE — Assessment & Plan Note (Signed)
Patient with initial BP 202/70s today. Asymptomatic. She reports her home readings are much better and that she always runs higher at [place]. She says she checked last night and BP was in 444P systolic. She reports compliance with regimen but that she forgot to take them this morning because she was rushing to get to the clinic.   BP appears very poorly controlled here. Even if her Bps are in 150-160 at home, she still has room for improvement. Upon BP recheck, SBP had improved to 180s, but patient was noted to be bradycardic. This severely limits her options for BP control. She is already on lasix 40mg  and 60mg  alternating days, so I worry that adding spiro would be too dehydrating. Furthermore, her bradycardia precludes the addition of a BB. It is possible that she has a component of white coat hypertension. We will have patient record her BP at home ad bring her Bp cuff to next OV so that we can make sure it is calibrated correctly. If she remains poorly controlled, may need to consider clonidine, but if there is a question of medication adherence this may also be a risky medicine for her.

## 2022-03-07 NOTE — Patient Instructions (Signed)
Dear Sherry [name],  Thank you for trusting [place] with your care. We discussed your urinary symptoms and blood pressure.   We would like for you to take an antibioitic twice daily for 3 days.  For your blood pressure, please continue to check your blood pressure at home and record the readings. Please bring your cuff with you to your next appointment as well.  We will not make any changes to your regimen at this time. We would like to see you back in 2-4 weeks for a blood pressure follow up.

## 2022-03-07 NOTE — Progress Notes (Signed)
   CC: UTI  HPI:Sherry [name] is a 77 y.o. female who presents for evaluation of BP. Please see individual problem based A/P for details.  Depression, PHQ-9: Based on the patients  [place] Visit from 11/28/2021 in [place]  PHQ-9 Total Score 0      score we have .  Past Medical History:  Diagnosis Date   AKI (acute kidney injury) ([place]) 01/31/2020   Anemia    Aortic stenosis    Arthritis    Bilateral lower extremity edema    Bilateral lower extremity edema    Blurry vision, bilateral 05/08/2016   Bradycardia    a. 01/2013 - asymptomatic.   Chronic diastolic heart failure (HCC)    Chronic sinus bradycardia 07/02/2012   CKD (chronic kidney disease)    a. baseline CKD stage III   Coronary artery disease    Epilepsy ([place]) 06/04/2006   EEG 02/19/12 " Interpretation:  This is an abnormal EEG demonstrating continuous and generalized slowing of the background in the theta frequency. Irregular rhythm of EKG noted.  Clinical correlation: the generalized slowing is consistent with a mild encephalopathy of nonspecific etiologies for which the differential would include infectious, toxic, metabolic, inflammatory, and vascular etiologies.    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 -Orbits: Grossly unremarkable. -Paranasal sinuses: Imaged portions clear. -Brain: No acute abnormalities such as hemorrhage, hydrocephalus, acute ischemia, or evidence of a mass lesion.   There is patchy bilateral cerebral white matter T2 and flair signal hyper intensities.  Centimeter or smaller CSF intensity   Mobitz (type) I (Wenckebach's) atrioventricular block    a. 01/2013 - asymptomatic.   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   TIA (transient ischemic attack) 2014   pt stated she had "mini strokes"   Vitamin D deficiency 04/22/2014   Review of Systems:   See hpi  Physical Exam: Vitals:   03/07/22 0942 03/07/22 1018  BP: (!) 202/73 (!) 189/72  Pulse: 66 (!) 54  Temp: 98 F (36.7 C)   TempSrc: Oral   SpO2: 100%    General: nad HEENT: Conjunctiva nl , antiicteric sclerae, moist mucous membranes, no exudate or erythema Cardiovascular: Normal rate, regular rhythm.  Holosystolic murmur Pulmonary : Equal breath sounds, No wheezes, rales, or rhonchi Abdominal: soft, nontender,  bowel sounds present, no suprapubic tenderness Ext: No edema in lower extremities, no tenderness to palpation of lower extremities.   Assessment & Plan:   See Encounters Tab for problem based charting.  Patient discussed with Sherry [name]

## 2022-03-08 ENCOUNTER — Telehealth: Payer: Self-pay

## 2022-03-08 ENCOUNTER — Other Ambulatory Visit: Payer: Self-pay | Admitting: Internal Medicine

## 2022-03-08 DIAGNOSIS — K219 Gastro-esophageal reflux disease without esophagitis: Secondary | ICD-10-CM

## 2022-03-08 NOTE — Progress Notes (Signed)
Internal Medicine Clinic Attending  Case discussed with Dr. Elliot Gurney  At the time of the visit.  We reviewed the resident's history and exam and pertinent patient test results.  I agree with the assessment, diagnosis, and plan of care documented in the resident's note.    This is a tough situation. This patient is already on maximal doses of amlodipine, hydralazine, and the maximum amount of diuretic (furosemide) that she can tolerate. Her HR is in the 50's now, so I don't think a beta blocker is a good option for her. With her reporting her home blood pressures much lower than what we're measuring here, I agree not to start a new medicine today. Dr Elliot Gurney counseled patient to please record her home blood pressures and bring this log in next time so we can assess if additional medicine is needed

## 2022-03-08 NOTE — Telephone Encounter (Signed)
(  3:17 pm) PC SW scheduled an initial palliative care visit with the clinical team. Patient is scheduled for 03/14/22 @ 10 am per patient's request as she has other appointments earlier in the week.

## 2022-03-12 DIAGNOSIS — M79642 Pain in left hand: Secondary | ICD-10-CM | POA: Diagnosis not present

## 2022-03-12 DIAGNOSIS — M25562 Pain in left knee: Secondary | ICD-10-CM | POA: Diagnosis not present

## 2022-03-12 DIAGNOSIS — R768 Other specified abnormal immunological findings in serum: Secondary | ICD-10-CM | POA: Diagnosis not present

## 2022-03-12 DIAGNOSIS — M25561 Pain in right knee: Secondary | ICD-10-CM | POA: Diagnosis not present

## 2022-03-12 DIAGNOSIS — M79672 Pain in left foot: Secondary | ICD-10-CM | POA: Diagnosis not present

## 2022-03-12 DIAGNOSIS — M79671 Pain in right foot: Secondary | ICD-10-CM | POA: Diagnosis not present

## 2022-03-12 DIAGNOSIS — M79641 Pain in right hand: Secondary | ICD-10-CM | POA: Diagnosis not present

## 2022-03-12 DIAGNOSIS — M2560 Stiffness of unspecified joint, not elsewhere classified: Secondary | ICD-10-CM | POA: Diagnosis not present

## 2022-03-12 DIAGNOSIS — M2540 Effusion, unspecified joint: Secondary | ICD-10-CM | POA: Diagnosis not present

## 2022-03-12 DIAGNOSIS — L609 Nail disorder, unspecified: Secondary | ICD-10-CM | POA: Diagnosis not present

## 2022-03-14 ENCOUNTER — Other Ambulatory Visit: Payer: 59

## 2022-03-14 ENCOUNTER — Other Ambulatory Visit: Payer: Self-pay | Admitting: Anesthesiology

## 2022-03-14 VITALS — BP 140/76 | HR 76 | Temp 97.8°F

## 2022-03-14 DIAGNOSIS — Z515 Encounter for palliative care: Secondary | ICD-10-CM

## 2022-03-14 NOTE — Progress Notes (Signed)
PATIENT NAME: MALORY SPURR DOB: January 26, 1946 MRN: [id number]  PRIMARY CARE PROVIDER: Idamae Schuller, MD  RESPONSIBLE PARTY:  Acct ID - Guarantor Home Phone Work Phone Relationship Acct Type  [id number] DAPHNIE, VENTURINI* [phone]  Self P/F     [address] [place], [place] [address]   Initial Palliative Care Encounter Note  Completed home visit with Olivia Canter, RN.  Husband also present     Social: Married 49 years. Only son died. Has 1 daughter; has grand and great-grand children   Cognitive: A&O x4   Appetite: 2 meals a day and all of the of fluids she is allowed to have daily; doesn't graze or snack in between meals  Cardiac: reports she has heart murmur and is a patient of Dr. Burt Knack; no issues at this time   Mobility: sitting up in wheelchair w/o assist; LL ankle turned inward; reports she can stand with assist of DME sometimes; transfers intoand out of bed without assist; uses BSC because she can't get into her bathroom w/the wheelchair   Sleeping Pattern: sleeps all night with no issues  Respiratory: lungs clear; no issues when BLE edematous  Pain: 0/10 at this time; usually has none but does have achiness at night; doesn't need pain meds  Palliative Care/ Hospice: LPN explained role and purpose of palliative care including visit frequency. Also discussed benefits of hospice care as well as the differences between the two with patient.   Goals of Care: Patient reports she would like to stay in her home and die there as well. She feels that at this moment she would like all treatments and full scope of care since she has a good quality of life. Will follow up on ACP at next visit.      Summary of visit: next appt 2.15.24 @ 10am    CODE STATUS:  Full Code ADVANCED DIRECTIVES: N MOST FORM: No PPS: 40%   PHYSICAL EXAM:   VITALS: Today's Vitals   03/14/22 1703  BP: (!) 140/76  Pulse: 76  Temp: 97.8 F (36.6 C)  TempSrc: Temporal  SpO2: 98%  PainSc: 0-No  pain    LUNGS: clear to auscultation  CARDIAC: Cor irreg, irreg RRR} EXTREMITIES: LLE 2+ pitting; slightly turned inward; RLE 1+ pitting SKIN: Skin color, texture, turgor normal. No rashes or lesions or normal  NEURO: negative for symptoms       Antoinetta Berrones Georgann Housekeeper, LPN

## 2022-03-14 NOTE — Progress Notes (Deleted)
PATIENT NAME: SHALIN LINDERS DOB: [birth year] MRN: [id number]  PRIMARY CARE PROVIDER: Idamae Schuller, MD  RESPONSIBLE PARTY:  Acct ID - Guarantor Home Phone Work Phone Relationship Acct Type  [id number] TEKESHIA, KLAHR* [phone]  Self P/F     [address] [place], [place] [address]   Palliative Care Initial Visit on 1.25.24   HISTORY OF PRESENT ILLNESS:      PHYSICAL EXAM:   VITALS:There were no vitals filed for this visit.  LUNGS: clear to auscultation  CARDIAC: Cor irreg, irreg RRR} *** EXTREMITIES: {Exam; extremity:10330} SKIN: Skin color, texture, turgor normal. No rashes or lesions or {normal findings:33173}  NEURO: {Findings; ROS neuro:30532::"negative"}       Briena Swingler Georgann Housekeeper, LPN

## 2022-03-14 NOTE — Telephone Encounter (Signed)
Pt called stating she needs a refill on her medication.   1. Which medications need refilled? Phenobarbital 64.8 mg.    2. Which pharmacy/location is medication to be sent to? [facility]  3. Do they need a 30 day or 90 day supply? 90 day supply

## 2022-03-15 MED ORDER — PHENOBARBITAL 64.8 MG PO TABS: 129.6000 mg | ORAL_TABLET | Freq: Every day | ORAL | 0 refills | Status: DC

## 2022-03-19 ENCOUNTER — Encounter: Payer: Self-pay | Admitting: Neurology

## 2022-03-19 ENCOUNTER — Ambulatory Visit (INDEPENDENT_AMBULATORY_CARE_PROVIDER_SITE_OTHER): Payer: 59 | Admitting: Neurology

## 2022-03-19 VITALS — BP 145/69 | HR 71 | Ht 61.0 in | Wt 141.0 lb

## 2022-03-19 DIAGNOSIS — G40209 Localization-related (focal) (partial) symptomatic epilepsy and epileptic syndromes with complex partial seizures, not intractable, without status epilepticus: Secondary | ICD-10-CM | POA: Diagnosis not present

## 2022-03-19 DIAGNOSIS — G629 Polyneuropathy, unspecified: Secondary | ICD-10-CM | POA: Diagnosis not present

## 2022-03-19 MED ORDER — PHENOBARBITAL 64.8 MG PO TABS: 129.6000 mg | ORAL_TABLET | Freq: Every day | ORAL | 3 refills | Status: DC

## 2022-03-19 MED ORDER — ZONISAMIDE 100 MG PO CAPS: ORAL_CAPSULE | ORAL | 3 refills | Status: DC

## 2022-03-19 NOTE — Patient Instructions (Addendum)
Always a pleasure to see you. Continue Zonisamide 100mg : take 2 capsules every night and Phenobarbital 64.8mg : take 2 tablets every night. Please let me know if kidney function worsens. Also please let me know if your left foot weakness is worsening and we can proceed with the MRI of your lumbar spine. Follow-up in 6 months, call for any changes   Seizure Precautions: 1. If medication has been prescribed for you to prevent seizures, take it exactly as directed.  Do not stop taking the medicine without talking to your doctor first, even if you have not had a seizure in a long time.   2. Avoid activities in which a seizure would cause danger to yourself or to others.  Don't operate dangerous machinery, swim alone, or climb in high or dangerous places, such as on ladders, roofs, or girders.  Do not drive unless your doctor says you may.  3. If you have any warning that you may have a seizure, lay down in a safe place where you can't hurt yourself.    4.  No driving for 6 months from last seizure, as per [facility].   Please refer to the following link on the [place] website for more information: http://[email or web address].org/[email or web address]/driving/drivingu.cfm   5.  Maintain good sleep hygiene.   6.  Contact your doctor if you have any problems that may be related to the medicine you are taking.  7.  Call 911 and bring the patient back to the ED if:        A.  The seizure lasts longer than 5 minutes.       B.  The patient doesn't awaken shortly after the seizure  C.  The patient has new problems such as difficulty seeing, speaking or moving  D.  The patient was injured during the seizure  E.  The patient has a temperature over 102 F (39C)  F.  The patient vomited and now is having trouble breathing

## 2022-03-19 NOTE — Progress Notes (Signed)
NEUROLOGY FOLLOW UP OFFICE NOTE  Sherry [name] [id number] 09/18/1945  HISTORY OF PRESENT ILLNESS: I had the pleasure of seeing Sherry [name] in follow-up in the neurology clinic on 03/19/2022.  The patient was last seen 6 months ago for right temporal lobe epilepsy. She is again accompanied by her husband who helps supplement the history today.  Records and images were personally reviewed where available. Zonisamide level in 08/2021 was 12.5 (ref 10-40), Phenobarbital level 27.1. Her most recent BMP done 02/22/22 showed a creatinine of 2.53, GFR 19 (she has been within the range of 16-22 in the past 9 months). She has had worsening of GFR over the years, GFR had been in the 40s in 2015, she has been on Zonisamide since 2015 started at [facility]. Her GFR in 2021 was 38, Zonisamide reduced to 200mg  qhs.They continue to deny any seizures, no convulsions since age 89, last focal seizure was in 2018. No staring/unresponsive episodes, gaps in time, olfactory/gustatory hallucinations, focal numbness/tingling, myoclonic jerks. She had an EMG/ NCV of the left leg to evaluate left foot weakness. EMG suggestive of a chronic sensorimotor axonal polyneuropathy. Superimposed multilevel lumbosacral radiculopathies affecting the L3-S1 nerve root/segment may also be possible. She denies any numbness/tingling in the left leg. She has occasional back pain that is relieved with patches. She was told she had a slipped disc when younger and that surgery would "not do any good." No bowel/bladder dysfunction. She denies any headaches, vision changes, no falls. She gets a little dizzy sometimes. She has a motorized wheelchair at home.    History on Initial Assessment 05/20/2016: This is a pleasant 77 yo RH woman with a history of hypertension, hyperlipidemia, AV block, CAD, and seizures. Records from [facility] were reviewed. She started having seizures in the 30s (age 46s), initially she was having generalized  convulsions often preceded by a sensation of severe nausea, "sick to stomach," followed by eyes rolling up, shaking of both arms and legs with tongue bite. They were initially occurring every 2 weeks while on Dilantin, none since adding Phenobarbital. They report the last GTC was probably in her 60s (>10 years ago). Family would also note episodes of unresponsiveness where her head drops forward slightly and she does not respond. She would be amnestic of these and feel like she woke up from sleep. Her daughter previously reported she seemed confused afterwards. There is note that these resolved after phenobarbital reduction in 2013, however her husband reports occasional episodes where he would be talking to her, then he comes back in the room and she would look at him like they did not talk earlier, she would not understand and say "huh?" and he asks if she heard him. He last noticed this around 1.5 months ago, prior to that was 7-8 months ago. She continues to have the sensation of a sickening feeling every few months or so, these always seem to occur when she is in the bath. She had one 2 weeks ago, these last up to a minute, she denies any confusion or focal symptoms. She denies any olfactory/gustatory hallucinations, deja vu, focal numbness/tingling/weakness, myoclonic jerks. She notices her hand twitches sometimes. Her husband denies any nocturnal seizures.   She is currently taking Phenobarbital 64.8mg  2 tablets daily and Zonisamide 300mg  qhs. She was previously drowsy on higher doses of Phenobarbital, and with reduction of dose, she currently denies any side effects to her medications. She denies any headaches, dizziness, diplopia, dysarthria/dysphagia, neck/back pain, bladder dysfunction. She has  constipation. She needs a walker to ambulate and denies any falls. She denies any focal symptoms but reports tingling in both hands and weakness in both legs, no pain. She reports sleep is good, and reports good  compliance to medications. She reports a history of migraines, but these have resolved.   Epilepsy Risk Factors:  A maternal cousin had seizures when younger. Otherwise she had a normal birth and early development.  There is no history of febrile convulsions, CNS infections such as meningitis/encephalitis, significant traumatic brain injury, neurosurgical procedures.   Prior AEDs: Dilantin (ineffective) Laboratory Data: Phenobarbital level 03/13/16 - 28. BMP showed a creatinine of 1.32, BUN 28; vitamin D level in 09/2015 was 25.9 EEG per [facility] notes: mild diffuse slowing on routine EEG MRI per [facility] notes: showed evidence of old strokes 1-hour EEG: Occasional right temporal slowing, frequent right frontotemporal epileptiform discharges  PAST MEDICAL HISTORY: Past Medical History:  Diagnosis Date   AKI (acute kidney injury) ([place]) 01/31/2020   Anemia    Aortic stenosis    Arthritis    Bilateral lower extremity edema    Bilateral lower extremity edema    Blurry vision, bilateral 05/08/2016   Bradycardia    a. 01/2013 - asymptomatic.   Chronic diastolic heart failure (HCC)    Chronic sinus bradycardia 07/02/2012   CKD (chronic kidney disease)    a. baseline CKD stage III   Coronary artery disease    Epilepsy ([place]) 06/04/2006   EEG 02/19/12 " Interpretation:  This is an abnormal EEG demonstrating continuous and generalized slowing of the background in the theta frequency. Irregular rhythm of EKG noted.  Clinical correlation: the generalized slowing is consistent with a mild encephalopathy of nonspecific etiologies for which the differential would include infectious, toxic, metabolic, inflammatory, and vascular etiologies.    GERD (gastroesophageal reflux disease)    Headache(784.0)    Heart block AV second degree 02/15/2013   Heart murmur    Hyperlipidemia    Hypertension    Hypotension    Hypovolemic shock ([place])    Internal and external hemorrhoids without complication 55/08/3218    Colonoscopy in 2008 recs repeat in 5 years   Intractable focal epilepsy with impairment of consciousness ([place]) 01/02/2012   Late effects of cerebrovascular disease 01/23/2012   MRI Brain -Calvarium/skull base: No focal marrow replacing lesion suggestive of neoplasm. -Orbits: Grossly unremarkable. -Paranasal sinuses: Imaged portions clear. -Brain: No acute abnormalities such as hemorrhage, hydrocephalus, acute ischemia, or evidence of a mass lesion.  There is patchy bilateral cerebral white matter T2 and flair signal hyper intensities.  Centimeter or smaller CSF intensity   Mobitz (type) I (Wenckebach's) atrioventricular block    a. 01/2013 - asymptomatic.   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   TIA (transient ischemic attack) 2014   pt stated she had "mini strokes"   Vitamin D deficiency 04/22/2014    MEDICATIONS: Current Outpatient Medications on File Prior to Visit  Medication Sig Dispense Refill   amLODipine (NORVASC) 10 MG tablet TAKE 1 TABLET BY MOUTH DAILY 100 tablet 2   aspirin 81 MG EC tablet Take 81 mg by mouth daily.     Calcium Carb-Cholecalciferol (CALCIUM 500 +D) 500-400 MG-UNIT TABS Take 1 tablet by mouth 2 (two) times daily. 60 tablet 2   denosumab (PROLIA) 60 MG/ML SOSY  injection Inject 60 mg into the skin every 6 (six) months. 1 mL 1   furosemide (LASIX) 20 MG tablet Take 3 tablets (60 mg total) by mouth daily AND 2 tablets (40 mg total) daily. ALTERNATING. 200 tablet 3   hydrALAZINE (APRESOLINE) 100 MG tablet Take 1 tablet (100 mg total) by mouth 3 (three) times daily. 90 tablet 11   JARDIANCE 10 MG TABS tablet TAKE 1 TABLET BY MOUTH DAILY  BEFORE BREAKFAST 100 tablet 2   lidocaine (LIDODERM) 5 % Place 1 patch onto the skin daily. Remove & Discard patch within 12 hours or as directed by MD 30 patch 0    Liniments (BLUE-EMU SUPER STRENGTH EX) Apply 1 application topically in the morning and at bedtime.     pantoprazole (PROTONIX) 40 MG tablet TAKE 1 TABLET BY MOUTH DAILY 100 tablet 2   PHENobarbital (LUMINAL) 64.8 MG tablet Take 2 tablets (129.6 mg total) by mouth at bedtime. 60 tablet 0   potassium chloride (KLOR-CON) 10 MEQ tablet Take 1 tablet (10 mEq total) by mouth daily. 90 tablet 0   rosuvastatin (CRESTOR) 40 MG tablet Take 1 tablet (40 mg total) by mouth daily. 90 tablet 3   zonisamide (ZONEGRAN) 100 MG capsule TAKE 2 CAPSULES BY MOUTH AT NIGHT 180 capsule 3   No current facility-administered medications on file prior to visit.    ALLERGIES: Allergies  Allergen Reactions   Ibuprofen Other (See Comments)    Dr advised pt not to take ibuprofen    FAMILY HISTORY: Family History  Problem Relation Age of Onset   Hypertension Other    Heart disease Mother    Hypertension Mother    Cancer Father    Obesity Son    Asthma Sister    Heart disease Sister    Heart disease Sister     SOCIAL HISTORY: Social History   Socioeconomic History   Marital status: Married    Spouse name: Not on file   Number of children: 2   Years of education: 4   Highest education level: Not on file  Occupational History   Occupation: retired  Tobacco Use   Smoking status: Former    Packs/day: 0.50    Years: 10.00    Total pack years: 5.00    Types: Cigarettes    Quit date: 06/03/2000    Years since quitting: 21.8   Smokeless tobacco: Never  Vaping Use   Vaping Use: Never used  Substance and Sexual Activity   Alcohol use: No    Alcohol/week: 0.0 standard drinks of alcohol   Drug use: No   Sexual activity: Not Currently    Partners: Male  Other Topics Concern   Not on file  Social History Narrative   Current Social History 06/20/2020        Patient lives with spouse in a home which is 1 story. There are not steps up to the entrance, the patient uses a ramp.       Patient's method of  transportation is [employer].      The highest level of education was some high school.      The patient currently disabled.      Identified important Relationships are "God and my husband"       Pets : None       Interests / Fun: "Sitting on my front porch"       Current Stressors: None  Social Determinants of Health   Financial Resource Strain: Not on file  Food Insecurity: No Food Insecurity (10/17/2021)   Hunger Vital Sign    Worried About Running Out of Food in the Last Year: Never true    Ran Out of Food in the Last Year: Never true  Transportation Needs: No Transportation Needs (10/17/2021)   PRAPARE - Hydrologist (Medical): No    Lack of Transportation (Non-Medical): No  Physical Activity: Inactive (10/17/2021)   Exercise Vital Sign    Days of Exercise per Week: 0 days    Minutes of Exercise per Session: 0 min  Stress: No Stress Concern Present (10/17/2021)   [place]    Feeling of Stress : Only a little  Social Connections: Not on file  Intimate Partner Violence: Not At Risk (10/17/2021)   Humiliation, Afraid, Rape, and Kick questionnaire    Fear of Current or Ex-Partner: No    Emotionally Abused: No    Physically Abused: No    Sexually Abused: No     PHYSICAL EXAM: Vitals:   03/19/22 1236  BP: (!) 145/69  Pulse: 71  SpO2: 97%   General: No acute distress, sitting on wheelchair Head:  Normocephalic/atraumatic Skin/Extremities: No rash, no edema. Left foot inverted Neurological Exam: alert and awake. No aphasia or dysarthria. Fund of knowledge is appropriate.  Attention and concentration are normal.   Cranial nerves: Pupils equal, round. Extraocular movements intact with no nystagmus. Visual fields full.  No facial asymmetry.  Motor: 5/5 throughout except for 2/5 left foot eversion (similar to prior). Finger to nose testing intact. Gait not tested.     IMPRESSION: This is a pleasant 77 yo RH woman with a history of  hypertension, hyperlipidemia, AV block, CAD, and right temporal lobe epilepsy. No convulsions since age 48, no focal seizures since 2018. She has had continued worsening kidney function since Zonisamide was started in 2015. Zonisamide dose has been reduced, her GFR has been stable the past 9 months, Zonisamide level is low normal. We discussed risks and benefits, she would like to stay on current dose of Zonisamide 200mg  qhs. We agreed that if GFR continues to worsen, would reduce Zonisamide to 100mg  qhs. Continue Phenobarbital 64.8mg  2 tablets qhs. We discussed EMG of left leg showing a chronic sensorimotor axonal polyneuropathy. Superimposed multilevel lumbosacral radiculopathies affecting the L3-S1 nerve root/segment may also be possible, however at this point we agreed to hold off on lumbar MRI as symptoms are stable and she is not interested in surgery. She does not drive. Follow-up in 6 months, call for any changes.    Thank you for allowing me to participate in her care.  Please do not hesitate to call for any questions or concerns.    Ellouise Newer, M.D.   CC: Dr. Humphrey Rolls

## 2022-04-04 ENCOUNTER — Other Ambulatory Visit: Payer: 59

## 2022-04-04 VITALS — BP 138/70 | HR 66 | Temp 98.3°F

## 2022-04-04 DIAGNOSIS — Z515 Encounter for palliative care: Secondary | ICD-10-CM

## 2022-04-04 NOTE — Progress Notes (Addendum)
PATIENT NAME: [name] DOB: 09-12-1945 MRN: [id number]  PRIMARY CARE PROVIDER: Idamae Schuller, MD  RESPONSIBLE PARTY:  Acct ID - Guarantor Home Phone Work Phone Relationship Acct Type  [id number] CARELI, [id number]  Self P/F     [address], [place], [place] [address]   Palliative Care F/U Encounter Note   Completed visit.  Husband also present      GI/GU: regular BMs/continent; no issues at this time  Cardiac: LLE noted with 1+ pitting  and RLE with trace edema  Cognitive: alert and oriented; no issues   Appetite: 2 meals per day; no snacking   Mobility: no falls; remains in w/c and transfers self to bed without assist  Respiratory: no issues at this time  Sleeping Pattern: no issues at this time; only awakens to go to bathroom  Pain: denies pain at this time  Palliative Care/ Hospice: Discussed benefits of hospice care as well as the differences between the two with patient. Encouraged her to fill out MOST form and take it with her to her next doctor visit   Patient stable and had conversation about being inactive with Palliative Care until patient has a need. She voiced understand and agreement. Information forwarded to [facility] Team.    CODE STATUS: Full Code ADVANCED DIRECTIVES: N MOST FORM: has copies to fill out PPS: 40%   PHYSICAL EXAM:   VITALS: Today's Vitals   04/04/22 1024  BP: 138/70  Pulse: 66  Temp: 98.3 F (36.8 C)  SpO2: 98%  PainSc: 0-No pain    LUNGS: clear to auscultation  CARDIAC: Cor irreg, irreg RRR EXTREMITIES: Trace edema to R ankle; 1+ pitting edema to L ankle SKIN: normal  NEURO: negative       Sherry Capistran Georgann Housekeeper, LPN

## 2022-04-04 NOTE — Progress Notes (Signed)
COMMUNITY PALLIATIVE CARE SW NOTE  PATIENT NAME: Sherry [name] DOB: 1946/01/30 MRN: [id number]  PRIMARY CARE PROVIDER: Idamae Schuller, MD  RESPONSIBLE PARTY:  Acct ID - Guarantor Home Phone Work Phone Relationship Acct Type  [id number] [name], [id number]  Self P/F     [address], [place], [place] [address]   [place] Telephonic Encounter  PC SW provided brief introduction to patient and her husband. Patient is alert and oriented x3. Patient appears to be managing her symptoms well. Her appetite is good. She ambulates in her wheelchair. She has no breathing or sleep issues at this time. She denies pain. Patient was provided education regarding advance care planning/MOST form and education regarding hospice.  Patient is doing well overall. No psychosocial issues presented.    Social History   Tobacco Use   Smoking status: Former    Packs/day: 0.50    Years: 10.00    Total pack years: 5.00    Types: Cigarettes    Quit date: 06/03/2000    Years since quitting: 21.8   Smokeless tobacco: Never  Substance Use Topics   Alcohol use: No    Alcohol/week: 0.0 standard drinks of alcohol    CODE STATUS: Full Code ADVANCED DIRECTIVES: No MOST FORM COMPLETE:  Education provided, form left in the home HOSPICE EDUCATION PROVIDED: Yes  Duration of telephonic encounter and documentation: 15 minutes.  [address] [place], [place]

## 2022-04-10 DIAGNOSIS — D61818 Other pancytopenia: Secondary | ICD-10-CM | POA: Diagnosis not present

## 2022-04-10 DIAGNOSIS — N184 Chronic kidney disease, stage 4 (severe): Secondary | ICD-10-CM | POA: Diagnosis not present

## 2022-04-10 DIAGNOSIS — N2581 Secondary hyperparathyroidism of renal origin: Secondary | ICD-10-CM | POA: Diagnosis not present

## 2022-04-10 DIAGNOSIS — N390 Urinary tract infection, site not specified: Secondary | ICD-10-CM | POA: Diagnosis not present

## 2022-04-16 ENCOUNTER — Telehealth: Payer: Self-pay | Admitting: Neurology

## 2022-04-16 NOTE — Telephone Encounter (Signed)
Patient needs a refill on phenobarbital sent into the [facility] on [address]

## 2022-04-16 NOTE — Telephone Encounter (Signed)
Pt called advised she has refills at her pharmacy called pt to call the pharmacy to get medication filled

## 2022-04-29 ENCOUNTER — Other Ambulatory Visit (HOSPITAL_COMMUNITY): Payer: Self-pay | Admitting: Family Medicine

## 2022-04-29 DIAGNOSIS — I5032 Chronic diastolic (congestive) heart failure: Secondary | ICD-10-CM

## 2022-04-29 DIAGNOSIS — E876 Hypokalemia: Secondary | ICD-10-CM

## 2022-05-06 ENCOUNTER — Ambulatory Visit: Payer: 59 | Attending: Cardiovascular Disease | Admitting: Cardiovascular Disease

## 2022-05-06 ENCOUNTER — Encounter: Payer: Self-pay | Admitting: Cardiovascular Disease

## 2022-05-06 VITALS — BP 170/80 | HR 65 | Ht 61.0 in | Wt 145.2 lb

## 2022-05-06 DIAGNOSIS — I517 Cardiomegaly: Secondary | ICD-10-CM

## 2022-05-06 DIAGNOSIS — I10 Essential (primary) hypertension: Secondary | ICD-10-CM | POA: Diagnosis not present

## 2022-05-06 DIAGNOSIS — I5032 Chronic diastolic (congestive) heart failure: Secondary | ICD-10-CM | POA: Diagnosis not present

## 2022-05-06 DIAGNOSIS — N184 Chronic kidney disease, stage 4 (severe): Secondary | ICD-10-CM | POA: Diagnosis not present

## 2022-05-06 DIAGNOSIS — I440 Atrioventricular block, first degree: Secondary | ICD-10-CM

## 2022-05-06 DIAGNOSIS — I2510 Atherosclerotic heart disease of native coronary artery without angina pectoris: Secondary | ICD-10-CM | POA: Diagnosis not present

## 2022-05-06 DIAGNOSIS — I350 Nonrheumatic aortic (valve) stenosis: Secondary | ICD-10-CM

## 2022-05-06 MED ORDER — EMPAGLIFLOZIN 10 MG PO TABS: 10.0000 mg | ORAL_TABLET | Freq: Every day | ORAL | 3 refills | Status: DC

## 2022-05-06 NOTE — Patient Instructions (Signed)
Medication Instructions:  Your physician recommends that you continue on your current medications as directed. Please refer to the Current Medication list given to you today.  *If you need a refill on your cardiac medications before your next appointment, please call your pharmacy*   Lab Work: NONE If you have labs (blood work) drawn today and your tests are completely normal, you will receive your results only by: [place] (if you have MyChart) OR A paper copy in the mail If you have any lab test that is abnormal or we need to change your treatment, we will call you to review the results.   Testing/Procedures: ECHO (same day as next appt) Your physician recommends that you continue on your current medications as directed. Please refer to the Current Medication list given to you today.  Follow-Up: At [facility], you and your health needs are our priority.  As part of our continuing mission to provide you with exceptional heart care, we have created designated Provider Care Teams.  These Care Teams include your primary Cardiologist (physician) and Advanced Practice Providers (APPs -  Physician Assistants and Nurse Practitioners) who all work together to provide you with the care you need, when you need it.  Your next appointment:   6 month(s)  Provider:   Sherren Mocha, MD

## 2022-05-06 NOTE — Progress Notes (Signed)
Cardiology Office Note:    Date:  05/06/2022   ID:  FLORIDALMA LIAO, DOB 05-05-1945, MRN [id number]  PCP:  Idamae Schuller, [place] Prov[facility] Cardiologist:  Sherren Mocha, MD     Referring MD: Idamae Schuller, MD   Chief Complaint  Patient presents with   Coronary Artery Disease   Aortic Stenosis    History of Present Illness:    Sherry [name] is a 77 y.o. female with a hx of CAD, hypertension, severe LVH, conduction disease with remote history of 2: 1 AV block, and aortic stenosis.  The patient has been managed conservatively in the setting of advanced arthritis, chronic leg weakness, wheelchair-bound state, and stage IV chronic kidney disease.  Despite her advanced myocardial and aortic valve disease, she has remained completely asymptomatic with her low level of physical activity.  Today she is here with her husband.  She denies chest pain, chest pressure, shortness of breath, orthopnea, PND, or leg swelling.  Amlodipine was discontinued by nephrology, I suspect for swelling.  She has been started on lisinopril 5 mg daily and she is followed regularly by nephrology as an outpatient.  Past Medical History:  Diagnosis Date   AKI (acute kidney injury) ([place]) 01/31/2020   Anemia    Aortic stenosis    Arthritis    Bilateral lower extremity edema    Bilateral lower extremity edema    Blurry vision, bilateral 05/08/2016   Bradycardia    a. 01/2013 - asymptomatic.   Chronic diastolic heart failure (HCC)    Chronic sinus bradycardia 07/02/2012   CKD (chronic kidney disease)    a. baseline CKD stage III   Coronary artery disease    Epilepsy ([place]) 06/04/2006   EEG 02/19/12 " Interpretation:  This is an abnormal EEG demonstrating continuous and generalized slowing of the background in the theta frequency. Irregular rhythm of EKG noted.  Clinical correlation: the generalized slowing is consistent with a mild encephalopathy of nonspecific etiologies for which the  differential would include infectious, toxic, metabolic, inflammatory, and vascular etiologies.    GERD (gastroesophageal reflux disease)    Headache(784.0)    Heart block AV second degree 02/15/2013   Heart murmur    Hyperlipidemia    Hypertension    Hypotension    Hypovolemic shock ([place])    Internal and external hemorrhoids without complication 0000000   Colonoscopy in 2008 recs repeat in 5 years   Intractable focal epilepsy with impairment of consciousness ([place]) 01/02/2012   Late effects of cerebrovascular disease 01/23/2012   MRI Brain -Calvarium/skull base: No focal marrow replacing lesion suggestive of neoplasm. -Orbits: Grossly unremarkable. -Paranasal sinuses: Imaged portions clear. -Brain: No acute abnormalities such as hemorrhage, hydrocephalus, acute ischemia, or evidence of a mass lesion.  There is patchy bilateral cerebral white matter T2 and flair signal hyper intensities.  Centimeter or smaller CSF intensity   Mobitz (type) I (Wenckebach's) atrioventricular block    a. 01/2013 - asymptomatic.   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   TIA (transient ischemic attack) 2014   pt stated she had "mini strokes"   Vitamin D deficiency 04/22/2014    Past Surgical History:  Procedure Laterality Date   ABDOMINAL HYSTERECTOMY     LEFT AND RIGHT HEART CATHETERIZATION WITH CORONARY ANGIOGRAM N/A 05/24/2013  Procedure: LEFT AND RIGHT HEART CATHETERIZATION WITH CORONARY ANGIOGRAM;  Surgeon: Sinclair Grooms, MD;  Location: [facility] C[facility];  Service: Cardiovascular;  Laterality: N/A;   LEFT HEART CATH N/A 02/01/2020   Procedure: Left Heart Cath;  Surgeon: Jolaine Artist, MD;  Location: [facility] LAB;  Service: Cardiovascular;  Laterality: N/A;   RIGHT HEART CATH N/A 02/01/2020   Procedure: RIGHT HEART CATH;  Surgeon:  Jolaine Artist, MD;  Location: [facility];  Service: Cardiovascular;  Laterality: N/A;    Current Medications: Current Meds  Medication Sig   aspirin 81 MG EC tablet Take 81 mg by mouth daily.   Calcium Carb-Cholecalciferol (CALCIUM 500 +D) 500-400 MG-UNIT TABS Take 1 tablet by mouth 2 (two) times daily.   denosumab (PROLIA) 60 MG/ML SOSY injection Inject 60 mg into the skin every 6 (six) months.   furosemide (LASIX) 20 MG tablet Take 3 tablets (60 mg total) by mouth daily AND 2 tablets (40 mg total) daily. ALTERNATING.   hydrALAZINE (APRESOLINE) 100 MG tablet Take 1 tablet (100 mg total) by mouth 3 (three) times daily.   lidocaine (LIDODERM) 5 % Place 1 patch onto the skin daily. Remove & Discard patch within 12 hours or as directed by MD   Liniments (BLUE-EMU SUPER STRENGTH EX) Apply 1 application topically in the morning and at bedtime.   lisinopril (ZESTRIL) 5 MG tablet Take 5 mg by mouth daily.   pantoprazole (PROTONIX) 40 MG tablet TAKE 1 TABLET BY MOUTH DAILY   PHENobarbital (LUMINAL) 64.8 MG tablet Take 2 tablets (129.6 mg total) by mouth at bedtime.   potassium chloride (KLOR-CON) 10 MEQ tablet TAKE 1 TABLET(10 MEQ) BY MOUTH DAILY   rosuvastatin (CRESTOR) 40 MG tablet Take 1 tablet (40 mg total) by mouth daily.   zonisamide (ZONEGRAN) 100 MG capsule TAKE 2 CAPSULES BY MOUTH AT NIGHT   [DISCONTINUED] JARDIANCE 10 MG TABS tablet TAKE 1 TABLET BY MOUTH DAILY  BEFORE BREAKFAST     Allergies:   Ibuprofen   Social History   Socioeconomic History   Marital status: Married    Spouse name: Not on file   Number of children: 2   Years of education: 4   Highest education level: Not on file  Occupational History   Occupation: retired  Tobacco Use   Smoking status: Former    Packs/day: 0.50    Years: 10.00    Additional pack years: 0.00    Total pack years: 5.00    Types: Cigarettes    Quit date: 06/03/2000    Years since quitting: 21.9   Smokeless tobacco: Never   Vaping Use   Vaping Use: Never used  Substance and Sexual Activity   Alcohol use: No    Alcohol/week: 0.0 standard drinks of alcohol   Drug use: No   Sexual activity: Not Currently    Partners: Male  Other Topics Concern   Not on file  Social History Narrative   Current Social History 06/20/2020        Patient lives with spouse in a home which is 1 story. There are not steps up to the entrance, the patient uses a ramp.       Patient's method of transportation is [employer].      The highest level of education was some high school.      The patient currently disabled.      Identified important Relationships are "God and my husband"  Pets : None       Interests / Fun: "Sitting on my front porch"       Current Stressors: None              Social Determinants of Health   Financial Resource Strain: Not on file  Food Insecurity: No Food Insecurity (10/17/2021)   Hunger Vital Sign    Worried About Running Out of Food in the Last Year: Never true    [place] in the Last Year: Never true  Transportation Needs: No Transportation Needs (10/17/2021)   PRAPARE - Hydrologist (Medical): No    Lack of Transportation (Non-Medical): No  Physical Activity: Inactive (10/17/2021)   Exercise Vital Sign    Days of Exercise per Week: 0 days    Minutes of Exercise per Session: 0 min  Stress: No Stress Concern Present (10/17/2021)   [place]    Feeling of Stress : Only a little  Social Connections: Not on file     Family History: The patient's family history includes Asthma in her sister; Cancer in her father; Heart disease in her mother, sister, and sister; Hypertension in her mother and another family member; Obesity in her son.  ROS:   Please see the history of present illness.    All other systems reviewed and are negative.  EKGs/Labs/Other Studies Reviewed:    The following  studies were reviewed today: Cardiac Studies & Procedures   CARDIAC CATHETERIZATION  CARDIAC CATHETERIZATION 02/01/2020  Narrative Findings:  Ao = 108/56 (76) LV = 136/4 RA =  2 RV = 27/7 PA = 30/2 (13) PCW = 5 Fick cardiac output/index = 4.3/2.7 Thermo CO/CI = 4.1/2.5 PVR = 2.0 WU FA sat = 99% PA sat = 66% No RV/LV interaction AoV: mean gradient 48mmHG AVA 1.47 cm2 (Fick) 1.38 cm2 (Thermo)  Assessment: 1. Low filling pressures with normal output 2. Moderate aortic stenosis  Plan/Discussion:  Continue hydration.  Glori Bickers, MD 6:56 PM     ECHOCARDIOGRAM  ECHOCARDIOGRAM COMPLETE 11/02/2021  Narrative ECHOCARDIOGRAM REPORT    Patient Name:   SHAILAH WIEDERKEHR Date of Exam: 11/02/2021 Medical Rec #:  [id number]           Height:       61.0 in Accession #:    [id number]          Weight:       143.2 lb Date of Birth:  [birth year]           BSA:          1.639 m Patient Age:    15 years            BP:           172/73 mmHg Patient Gender: F                   HR:           54 bpm. Exam Location:  [facility]  Procedure: 2D Echo, Cardiac Doppler and Color Doppler  Indications:    I35.0 AS  History:        Patient has prior history of Echocardiogram examinations, most recent 12/28/2020. CHF, AS; Risk Factors:Hypertension.  Sonographer:    Coralyn Helling RDCS Referring Phys: [place]   1. Left ventricular ejection fraction, by estimation, is 60 to 65%. The left ventricle has normal function. The left  ventricle has no regional wall motion abnormalities. There is severe concentric left ventricular hypertrophy. Left ventricular diastolic parameters are consistent with Grade I diastolic dysfunction (impaired relaxation). 2. Right ventricular systolic function is normal. The right ventricular size is normal. Mildly increased right ventricular wall thickness. There is mildly elevated pulmonary artery systolic pressure. The estimated  right ventricular systolic pressure is Q000111Q mmHg. 3. Left atrial size was moderately dilated. 4. Right atrial size was mildly dilated. 5. Moderate pericardial effusion. The pericardial effusion is circumferential. There is no evidence of cardiac tamponade. 6. The mitral valve is degenerative. Mild to moderate mitral valve regurgitation. Moderate mitral stenosis. The mean mitral valve gradient is 6.7 mmHg with average heart rate of 44 bpm. Moderate to severe mitral annular calcification. 7. The aortic valve is tricuspid. There is severe calcifcation of the aortic valve. There is severe thickening of the aortic valve. Aortic valve regurgitation is mild. Moderate to severe aortic valve stenosis. Aortic valve mean gradient measures 29.8 mmHg. Aortic valve Vmax measures 3.73 m/s. Aortic valve acceleration time measures 98 msec. 8. The inferior vena cava is normal in size with greater than 50% respiratory variability, suggesting right atrial pressure of 3 mmHg.  Comparison(s): Prior images reviewed side by side. The pericardial effusion is slightly larger. Mitral stenosis is now present. Aortic stenosis has worsened. Echo appearance is strongly suggestive of amyloidosis, but 2 previous PYP scans were negative for TTR amyloidosis.  FINDINGS Left Ventricle: Left ventricular ejection fraction, by estimation, is 60 to 65%. The left ventricle has normal function. The left ventricle has no regional wall motion abnormalities. The left ventricular internal cavity size was normal in size. There is severe concentric left ventricular hypertrophy. Left ventricular diastolic parameters are consistent with Grade I diastolic dysfunction (impaired relaxation). Normal left ventricular filling pressure.  Right Ventricle: The right ventricular size is normal. Mildly increased right ventricular wall thickness. Right ventricular systolic function is normal. There is mildly elevated pulmonary artery systolic pressure. The  tricuspid regurgitant velocity is 3.17 m/s, and with an assumed right atrial pressure of 3 mmHg, the estimated right ventricular systolic pressure is Q000111Q mmHg.  Left Atrium: Left atrial size was moderately dilated.  Right Atrium: Right atrial size was mildly dilated.  Pericardium: A moderately sized pericardial effusion is present. The pericardial effusion is circumferential. There is no evidence of cardiac tamponade.  Mitral Valve: The mitral valve is degenerative in appearance. Moderate to severe mitral annular calcification. Mild to moderate mitral valve regurgitation, with centrally-directed jet. Moderate mitral valve stenosis. The mean mitral valve gradient is 6.7 mmHg with average heart rate of 44 bpm.  Tricuspid Valve: The tricuspid valve is normal in structure. Tricuspid valve regurgitation is mild.  Aortic Valve: The aortic valve is tricuspid. There is severe calcifcation of the aortic valve. There is severe thickening of the aortic valve. Aortic valve regurgitation is mild. Aortic regurgitation PHT measures 646 msec. Moderate to severe aortic stenosis is present. Aortic valve mean gradient measures 29.8 mmHg. Aortic valve peak gradient measures 55.7 mmHg. Aortic valve area, by VTI measures 0.95 cm.  Pulmonic Valve: The pulmonic valve was normal in structure. Pulmonic valve regurgitation is trivial.  Aorta: The aortic root and ascending aorta are structurally normal, with no evidence of dilitation.  Venous: The inferior vena cava is normal in size with greater than 50% respiratory variability, suggesting right atrial pressure of 3 mmHg.  IAS/Shunts: No atrial level shunt detected by color flow Doppler.   LEFT VENTRICLE PLAX 2D LVIDd:  4.20 cm LVIDs:         2.30 cm LV PW:         1.80 cm LV IVS:        1.70 cm LVOT diam:     2.08 cm LV SV:         102 LV SV Index:   62 LVOT Area:     3.40 cm   RIGHT VENTRICLE            IVC RVSP:           43.2 mmHg  IVC  diam: 1.10 cm  LEFT ATRIUM             Index        RIGHT ATRIUM           Index LA diam:        3.30 cm 2.01 cm/m   RA Pressure: 3.00 mmHg LA Vol (A2C):   84.4 ml 51.50 ml/m  RA Area:     19.20 cm LA Vol (A4C):   69.2 ml 42.23 ml/m  RA Volume:   56.70 ml  34.60 ml/m LA Biplane Vol: 78.7 ml 48.02 ml/m AORTIC VALVE AV Area (Vmax):    0.90 cm AV Area (Vmean):   0.98 cm AV Area (VTI):     0.95 cm AV Vmax:           373.20 cm/s AV Vmean:          255.800 cm/s AV VTI:            1.066 m AV Peak Grad:      55.7 mmHg AV Mean Grad:      29.8 mmHg LVOT Vmax:         98.72 cm/s LVOT Vmean:        73.400 cm/s LVOT VTI:          0.299 m LVOT/AV VTI ratio: 0.28 AI PHT:            646 msec  AORTA Ao Root diam: 3.10 cm Ao Asc diam:  3.60 cm  MITRAL VALVE                TRICUSPID VALVE MV Area (PHT): 1.83 cm     TR Peak grad:   40.2 mmHg MV Mean grad:  6.7 mmHg     TR Vmax:        317.00 cm/s MV Decel Time: 414 msec     Estimated RAP:  3.00 mmHg MV E velocity: 132.60 cm/s  RVSP:           43.2 mmHg MV A velocity: 174.20 cm/s MV E/A ratio:  0.76         SHUNTS Systemic VTI:  0.30 m Systemic Diam: 2.08 cm  Dani Gobble Croitoru MD Electronically signed by Sanda Klein MD Signature Date/Time: 11/02/2021/3:00:30 PM    Final    MONITORS  LONG TERM MONITOR (3-14 DAYS) 11/15/2019  Narrative 1. The basic rhythm is normal sinus with an average HR of 51 bpm with first degree AV block (no second or third degree AV block present) 2. No atrial fibrillation or flutter 3. No high-grade heart block or pathologic pauses 4. There are rare PVC's and no supraventricular beats 5. There is a 16 beat run of NSVT   CT SCANS  CT CORONARY MORPH W/CTA COR W/SCORE 01/31/2021  Addendum 01/31/2021  3:53 PM ADDENDUM REPORT: 01/31/2021 15:50  CLINICAL DATA:  Severe Aortic Stenosis.  EXAM: Cardiac TAVR  CT  TECHNIQUE: A non-contrast, gated CT scan was obtained with axial slices of 3 mm through  the heart for aortic valve calcium scoring. A 90 kV retrospective, gated, contrast cardiac scan was obtained. Gantry rotation speed was 250 msecs and collimation was 0.6 mm. Nitroglycerin was not given. The 3D data set was reconstructed in 5% intervals of the 0-95% of the R-R cycle. Systolic and diastolic phases were analyzed on a dedicated workstation using MPR, MIP, and VRT modes. The patient received 100 cc of contrast.  FINDINGS: Image quality: Excellent.  Noise artifact is: Limited.  Valve Morphology: The aortic valve is tricuspid. The leaflets are severely calcified. There are diffuse calcifications. The leaflets demonstrate severely reduced movement in systole. Findings consistent with severe aortic stenosis.  Aortic Valve Calcium score: 988  Aortic annular dimension:  Phase assessed: 15%  Annular area: 330 mm2  Annular perimeter: 65.3 mm  Max diameter: 22.2 mm  Min diameter: 20.4 mm  Annular and subannular calcification: None.  Optimal coplanar projection: LAO 9 CAU 15  Coronary Artery Height above Annulus:  Left Main: 12.8 mm  Right Coronary: 18.6 mm  Sinus of Valsalva Measurements:  Non-coronary: 28.0 mm  Right-coronary: 28.0 mm  Left-coronary: 29.6 mm  Sinus of Valsalva Height:  Non-coronary: 20.5 mm  Right-coronary: 24.5 mm  Left-coronary: 21.8 mm  Sinotubular Junction: 27.1 mm  Ascending Thoracic Aorta: 33.9 mm  Coronary Arteries: Normal coronary origin. Right dominance. The study was performed without use of NTG and is insufficient for plaque evaluation. Please refer to recent cardiac catheterization for coronary assessment. Severe 3-vessel coronary calcifications noted.  Cardiac Morphology:  Right Atrium: Right atrial size is within normal limits.  Right Ventricle: The right ventricular cavity is within normal limits.  Left Atrium: Left atrial size is normal in size with no left atrial appendage filling defect.  Left  Ventricle: The ventricular cavity size is within normal limits. There are no stigmata of prior infarction. There is no abnormal filling defect.  Pulmonary arteries: Normal in size without proximal filling defect.  Pulmonary veins: Normal pulmonary venous drainage.  Pericardium: Small pericardial effusion.  Mitral Valve: The mitral valve is degenerative with severe mitral annular calcification.  Extra-cardiac findings: See attached radiology report for non-cardiac structures.  IMPRESSION: 1. Small annular measurements noted (330 mm2). This supports a 26 mm evlot pro TAVR with appropriate sinus measurements and sinus heights.  2. No significant annular or subannular calcifications.  3. Sufficient coronary to annulus distance.  4. Optimal Fluoroscopic Angle for Delivery: LAO 9 CAU [place] T. Audie Box, MD   Electronically Signed By: Eleonore Chiquito M.D. On: 01/31/2021 15:50  Narrative EXAM: OVER-READ INTERPRETATION  CT CHEST  The following report is an over-read performed by radiologist Dr. Vinnie Langton of [facility], [place] on 01/31/2021. This over-read does not include interpretation of cardiac or coronary anatomy or pathology. The coronary calcium score/coronary CTA interpretation by the cardiologist is attached.  COMPARISON:  None.  FINDINGS: Extracardiac findings will be described separately under dictation for contemporaneously obtained CTA chest, abdomen and pelvis.  IMPRESSION: Please see separate dictation for contemporaneously obtained CTA chest, abdomen and pelvis dated 01/31/2021 for full description of relevant extracardiac findings.  Electronically Signed: By: Vinnie Langton M.D. On: 01/31/2021 11:26   CARDIAC MRI  MR CARDIAC MORPHOLOGY W WO CONTRAST 12/24/2017  Narrative EXAM: CARDIAC MRI  TECHNIQUE: The patient was scanned on a 1.5 Tesla GE magnet. A dedicated cardiac coil was used. Functional imaging was done using  Fiesta sequences. 2,3, and 4 chamber views were done to assess for RWMA's. Modified Simpson's rule using a short axis stack was used to calculate an ejection fraction on a dedicated work Conservation officer, nature. The patient received 7 cc of Gadavist. After 10 minutes inversion recovery sequences were used to assess for infiltration and scar tissue.  CONTRAST:  7 cc Gadavist  FINDINGS: Limited images of the lung fields showed no gross abnormalities.  There was a moderate, circumferential pericardial effusion without evidence for tamponade.  There was normal left ventricular size with moderate concentric LV hypertrophy, EF 76% with no wall motion abnormalities. Normal right ventricular size and systolic function, EF Q000111Q. Mild left atrial enlargement. Normal right atrial size. There was mitral annular calcification with mild mitral regurgitation. The aortic valve was calcified and restricted. Degree of stenosis is better estimated by echo.  On delayed enhancement imaging, the myocardium was difficult to null. There was a suggestion of diffuse subendocardial late gadolinium enhancement (LGE).  Measurements:  LVEDV 155 mL  LVSV 118 mL  LVEF 76%  RVEDV 124 mL  RVSV 57 mL  RVEF 46%  IMPRESSION: 1. This study is suggestive of cardiac amyloidosis. There is a moderate pericardial effusion (without tamponade), moderate concentric LV hypertrophy, and on delayed enhancement imaging, the myocardium is difficult to null with a suggestion of diffuse subendocardial LGE.  2. There is aortic stenosis present, the degree would be better assessed by echo.  Dalton Mclean   Electronically Signed By: Loralie Champagne M.D. On: 12/24/2017 21:26   PYP SCAN  MYOCARDIAL AMYLOID PLANAR AND SPECT 01/12/2018  Narrative  H/CL ratio is 1.08.  By semi-quantitative assessment there is Grade 0 that represents no uptake and normal bone uptake.  There are hot spots with increased  radiotracer uptake in both kidneys. Further evaluation suggested  The study is normal and not suggestive of TRR amyloidosis.         EKG:  EKG is not ordered today.    Recent Labs: 06/15/2021: Hemoglobin 11.4; Platelet Count 195 07/26/2021: ALT 28 01/14/2022: B Natriuretic Peptide 971.4 02/22/2022: BUN 86; Creatinine, Ser 2.53; Potassium 4.4; Sodium 139  Recent Lipid Panel    Component Value Date/Time   CHOL 170 02/14/2022 1041   CHOL 179 01/26/2021 0735   TRIG 72 02/14/2022 1041   HDL 64 02/14/2022 1041   HDL 64 01/26/2021 0735   CHOLHDL 2.7 02/14/2022 1041   VLDL 14 02/14/2022 1041   LDLCALC 92 02/14/2022 1041   LDLCALC 100 (H) 01/26/2021 0735     Risk Assessment/Calculations:           Physical Exam:    VS:  BP (!) 170/80   Pulse 65   Ht 5\' 1"  (1.549 m)   Wt 145 lb 3.2 oz (65.9 kg)   SpO2 95%   BMI 27.44 kg/m     Wt Readings from Last 3 Encounters:  05/06/22 145 lb 3.2 oz (65.9 kg)  03/19/22 141 lb (64 kg)  02/27/22 144 lb 14.4 oz (65.7 kg)     GEN: Elderly, pleasant woman in no distress.  Patient in wheelchair. HEENT: Normal NECK: No JVD; bilateral carotid bruits LYMPHATICS: No lymphadenopathy CARDIAC: RRR, 4/6 systolic murmur at the right upper sternal border RESPIRATORY:  Clear to auscultation without rales, wheezing or rhonchi  ABDOMEN: Soft, non-tender, non-distended MUSCULOSKELETAL: Trace bilateral ankle and foot edema; No deformity  SKIN: Warm and dry NEUROLOGIC:  Alert and oriented x 3 PSYCHIATRIC:  Normal affect  ASSESSMENT:    1. Nonrheumatic aortic valve stenosis   2. LVH (left ventricular hypertrophy)   3. Chronic diastolic congestive heart failure ([place])   4. First degree AV block   5. CKD (chronic kidney disease) stage 4, GFR 15-29 ml/min (HCC)   6. Primary hypertension   7. Coronary artery disease involving native coronary artery of native heart without angina pectoris    PLAN:    In order of problems listed above:  The patient  has had symptoms concerning for paradoxical low-flow low gradient aortic stenosis (stage D3).  Her most recent echocardiogram showed a mean transvalvular gradient of 30 mmHg with calcified, restricted aortic valve leaflets.  She remains asymptomatic.  We have not moved forward with TAVR because of her comorbidities, wheelchair-bound status, stage IV kidney disease, and high likelihood of post-TAVR AV block which would require permanent pacemaker.  Patient has been evaluated by palliative medicine and has not needed their services at this time.  I will see her back in 6 months with a repeat echocardiogram. Discordant findings in the past for cardiac amyloid.  Many suggestive features with pericardial effusion, severe LVH, and aortic stenosis.  Cardiac MRI was suggestive but PYP scanning has been normal.  She has been followed in the advanced heart failure clinic. Appears euvolemic on exam Continue follow-up with clinical assessment on serial exams.  She has not had any syncope or presyncope. Followed by [facility].  Amlodipine recently discontinued and patient started on lisinopril.  She reports follow-up scheduled for April. Home blood pressures have been in good range and she reports recent systolic readings of [id number] to 128 mmHg.  She continues on Jardiance, furosemide, hydralazine, and lisinopril.  She is not a candidate for beta-blocker because of her history of high-grade AV block. Noted to have multivessel coronary artery disease.  Patient has no angina.  She is treated with rosuvastatin and aspirin.           Medication Adjustments/Labs and Tests Ordered: Current medicines are reviewed at length with the patient today.  Concerns regarding medicines are outlined above.  Orders Placed This Encounter  Procedures   ECHOCARDIOGRAM COMPLETE   Meds ordered this encounter  Medications   empagliflozin (JARDIANCE) 10 MG TABS tablet    Sig: Take 1 tablet (10 mg total) by mouth daily before  breakfast.    Dispense:  100 tablet    Refill:  3    Please send a replace/new response with 100-Day Supply if appropriate to maximize member benefit. Requesting 1 year supply.    Patient Instructions  Medication Instructions:  Your physician recommends that you continue on your current medications as directed. Please refer to the Current Medication list given to you today.  *If you need a refill on your cardiac medications before your next appointment, please call your pharmacy*   Lab Work: NONE If you have labs (blood work) drawn today and your tests are completely normal, you will receive your results only by: [place] (if you have MyChart) OR A paper copy in the mail If you have any lab test that is abnormal or we need to change your treatment, we will call you to review the results.   Testing/Procedures: ECHO (same day as next appt) Your physician recommends that you continue on your current medications as directed. Please refer to the Current Medication list given to you today.  Follow-Up: At [facility], you and your health needs are our priority.  As part of our continuing  mission to provide you with exceptional heart care, we have created designated Provider Care Teams.  These Care Teams include your primary Cardiologist (physician) and Advanced Practice Providers (APPs -  Physician Assistants and Nurse Practitioners) who all work together to provide you with the care you need, when you need it.  Your next appointment:   6 month(s)  Provider:   Sherren Mocha, MD        Signed, Sherren Mocha, MD  05/06/2022 1:21 PM    [place]

## 2022-05-10 ENCOUNTER — Other Ambulatory Visit: Payer: Self-pay | Admitting: Neurology

## 2022-05-21 ENCOUNTER — Other Ambulatory Visit (HOSPITAL_COMMUNITY): Payer: Self-pay | Admitting: Cardiology

## 2022-05-29 ENCOUNTER — Other Ambulatory Visit: Payer: Self-pay | Admitting: Neurology

## 2022-06-13 ENCOUNTER — Ambulatory Visit (INDEPENDENT_AMBULATORY_CARE_PROVIDER_SITE_OTHER): Payer: 59 | Admitting: Internal Medicine

## 2022-06-13 DIAGNOSIS — M7989 Other specified soft tissue disorders: Secondary | ICD-10-CM

## 2022-06-13 NOTE — Progress Notes (Signed)
[facility] Internal Medicine Residency Telephone Encounter Continuity Care Appointment  HPI:  This telephone encounter was created for [name] on 06/13/2022 for the following purpose/cc leg swelling.   Past Medical History:  Past Medical History:  Diagnosis Date   AKI (acute kidney injury) (HCC) 01/31/2020   Anemia    Aortic stenosis    Arthritis    Bilateral lower extremity edema    Bilateral lower extremity edema    Blurry vision, bilateral 05/08/2016   Bradycardia    a. 01/2013 - asymptomatic.   Chronic diastolic heart failure (HCC)    Chronic sinus bradycardia 07/02/2012   CKD (chronic kidney disease)    a. baseline CKD stage III   Coronary artery disease    Epilepsy (HCC) 06/04/2006   EEG 02/19/12 " Interpretation:  This is an abnormal EEG demonstrating continuous and generalized slowing of the background in the theta frequency. Irregular rhythm of EKG noted.  Clinical correlation: the generalized slowing is consistent with a mild encephalopathy of nonspecific etiologies for which the differential would include infectious, toxic, metabolic, inflammatory, and vascular etiologies.    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 -Orbits: Grossly unremarkable. -Paranasal sinuses: Imaged portions clear. -Brain: No acute abnormalities such as hemorrhage, hydrocephalus, acute ischemia, or evidence of a mass lesion.  There is patchy bilateral cerebral white matter T2 and flair signal hyper  intensities.  Centimeter or smaller CSF intensity   Mobitz (type) I (Wenckebach's) atrioventricular block    a. 01/2013 - asymptomatic.   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   TIA (transient ischemic attack) 2014   pt stated she had "mini strokes"   Vitamin D deficiency 04/22/2014     ROS:  Negative unless stated in the HPI.   Assessment / Plan / Recommendations:  Please see A&P under problem oriented charting for assessment of the patient's acute and chronic medical conditions.  As always, pt is advised that if symptoms worsen or new symptoms arise, they should go to an urgent care facility or to to ER for further evaluation.   Consent and Medical Decision Making:  Patient discussed with Dr. Sol Blazing This is a telephone encounter between [name] and Gwenevere Abbot on 06/13/2022 for leg swelling. The visit was conducted with the patient located at home and Gwenevere Abbot at [facility]. The patient's identity was confirmed using their DOB and current address. The patient has consented to being evaluated through a telephone encounter and understands the associated risks (an examination cannot be done and the patient may need to come in for an appointment) / benefits (allows the patient to remain at home, decreasing exposure to coronavirus). I personally spent 24 minutes on medical discussion.    Swelling of lower extremity Pt had an inpatient about regarding her lower extremity swelling but changed  it to a televisit given difficult transportation and difficulty putting on shoes although pt is wheel chair bound. Pt states this has been going on for weeks. Her concern was for gout but given her symptoms are bilateral and symmetric this makes it unlikely. She states her swelling comes up to her knees and feet are painful. On further hx pt  states, she has no SOB but is mostly inactive. She reports sleeping on the bed and sleeps on one pillow. Pt also reported decreased urinary output. Some of her answers appear inconsistent. Ddx included Renal failure vs acute heart failure vs venous insufficiency vs angioedema. Difficult to evaluate pt over the phone and without physical exam and lab work. Advised pt to come in person for evaluation. Reassured by the fact this is not an acute issue.    Gwenevere Abbot, MD Eligha Bridegroom. [facility] Internal Medicine Residency, PGY-2

## 2022-06-14 ENCOUNTER — Ambulatory Visit (INDEPENDENT_AMBULATORY_CARE_PROVIDER_SITE_OTHER): Payer: 59 | Admitting: Internal Medicine

## 2022-06-14 ENCOUNTER — Other Ambulatory Visit: Payer: Self-pay | Admitting: Neurology

## 2022-06-14 ENCOUNTER — Encounter: Payer: Self-pay | Admitting: Internal Medicine

## 2022-06-14 ENCOUNTER — Other Ambulatory Visit (HOSPITAL_COMMUNITY): Payer: Self-pay | Admitting: Cardiology

## 2022-06-14 VITALS — BP 165/65 | HR 63 | Temp 97.8°F | Ht 61.0 in

## 2022-06-14 DIAGNOSIS — M7989 Other specified soft tissue disorders: Secondary | ICD-10-CM | POA: Diagnosis not present

## 2022-06-14 DIAGNOSIS — I10 Essential (primary) hypertension: Secondary | ICD-10-CM

## 2022-06-14 HISTORY — DX: Other specified soft tissue disorders: M79.89

## 2022-06-14 LAB — COMPREHENSIVE METABOLIC PANEL
ALT: 16 U/L (ref 0–44)
AST: 31 U/L (ref 15–41)
Albumin: 3.2 g/dL — ABNORMAL LOW (ref 3.5–5.0)
Alkaline Phosphatase: 102 U/L (ref 38–126)
Anion gap: 12 (ref 5–15)
BUN: 64 mg/dL — ABNORMAL HIGH (ref 8–23)
CO2: 16 mmol/L — ABNORMAL LOW (ref 22–32)
Calcium: 7.3 mg/dL — ABNORMAL LOW (ref 8.9–10.3)
Chloride: 107 mmol/L (ref 98–111)
Creatinine, Ser: 2.8 mg/dL — ABNORMAL HIGH (ref 0.44–1.00)
GFR, Estimated: 17 mL/min — ABNORMAL LOW (ref >60)
Glucose, Bld: 80 mg/dL (ref 70–99)
Potassium: 4.9 mmol/L (ref 3.5–5.1)
Sodium: 135 mmol/L (ref 135–145)
Total Bilirubin: 0.4 mg/dL (ref 0.3–1.2)
Total Protein: 7.5 g/dL (ref 6.5–8.1)

## 2022-06-14 LAB — CBC WITH DIFFERENTIAL/PLATELET
Abs Immature Granulocytes: 0.02 10*3/uL (ref 0.00–0.07)
Basophils Absolute: 0 10*3/uL (ref 0.0–0.1)
Basophils Relative: 1 %
Eosinophils Absolute: 0.1 10*3/uL (ref 0.0–0.5)
Eosinophils Relative: 2 %
HCT: 31.5 % — ABNORMAL LOW (ref 36.0–46.0)
Hemoglobin: 9.6 g/dL — ABNORMAL LOW (ref 12.0–15.0)
Immature Granulocytes: 1 %
Lymphocytes Relative: 21 %
Lymphs Abs: 0.5 10*3/uL — ABNORMAL LOW (ref 0.7–4.0)
MCH: 28.7 pg (ref 26.0–34.0)
MCHC: 30.5 g/dL (ref 30.0–36.0)
MCV: 94.3 fL (ref 80.0–100.0)
Monocytes Absolute: 0.4 10*3/uL (ref 0.1–1.0)
Monocytes Relative: 14 %
Neutro Abs: 1.6 10*3/uL — ABNORMAL LOW (ref 1.7–7.7)
Neutrophils Relative %: 61 %
Platelets: 279 10*3/uL (ref 150–400)
RBC: 3.34 MIL/uL — ABNORMAL LOW (ref 3.87–5.11)
RDW: 13.6 % (ref 11.5–15.5)
WBC: 2.6 10*3/uL — ABNORMAL LOW (ref 4.0–10.5)
nRBC: 0 % (ref 0.0–0.2)

## 2022-06-14 LAB — TSH: TSH: 4.009 u[IU]/mL (ref 0.350–4.500)

## 2022-06-14 LAB — BRAIN NATRIURETIC PEPTIDE: B Natriuretic Peptide: 711.2 pg/mL — ABNORMAL HIGH (ref 0.0–100.0)

## 2022-06-14 NOTE — Assessment & Plan Note (Signed)
Pt had an inpatient about regarding her lower extremity swelling but changed it to a televisit given difficult transportation and difficulty putting on shoes although pt is wheel chair bound. Pt states this has been going on for weeks. Her concern was for gout but given her symptoms are bilateral and symmetric this makes it unlikely. She states her swelling comes up to her knees and feet are painful. On further hx pt states, she has no SOB but is mostly inactive. She reports sleeping on the bed and sleeps on one pillow. Pt also reported decreased urinary output. Some of her answers appear inconsistent. Ddx included Renal failure vs acute heart failure vs venous insufficiency vs angioedema. Difficult to evaluate pt over the phone and without physical exam and lab work. Advised pt to come in person for evaluation. Reassured by the fact this is not an acute issue.

## 2022-06-14 NOTE — Progress Notes (Unsigned)
CC: leg swelling  HPI:  Ms.Sahaana B Loos is a 77 y.o. with medical history of HTN, HLD, Aortic Stenosis, CHF, CKD IV, osteoporosis, hx of CVA presenting to [facility] for PCP follow up.   Please see problem-based list for further details, assessments, and plans.  Past Medical History:  Diagnosis Date   AKI (acute kidney injury) (HCC) 01/31/2020   Anemia    Aortic stenosis    Arthritis    Bilateral lower extremity edema    Bilateral lower extremity edema    Blurry vision, bilateral 05/08/2016   Bradycardia    a. 01/2013 - asymptomatic.   Chronic diastolic heart failure (HCC)    Chronic sinus bradycardia 07/02/2012   CKD (chronic kidney disease)    a. baseline CKD stage III   Coronary artery disease    Epilepsy (HCC) 06/04/2006   EEG 02/19/12 " Interpretation:  This is an abnormal EEG demonstrating continuous and generalized slowing of the background in the theta frequency. Irregular rhythm of EKG noted.  Clinical correlation: the generalized slowing is consistent with a mild encephalopathy of nonspecific etiologies for which the differential would include infectious, toxic, metabolic, inflammatory, and vascular etiologies.    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 -Orbits: Grossly unremarkable. -Paranasal sinuses: Imaged portions clear. -Brain: No acute abnormalities such as hemorrhage, hydrocephalus, acute ischemia, or evidence of a mass lesion.  There is patchy bilateral cerebral white matter T2 and  flair signal hyper intensities.  Centimeter or smaller CSF intensity   Mobitz (type) I (Wenckebach's) atrioventricular block    a. 01/2013 - asymptomatic.   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   TIA (transient ischemic attack) 2014   pt stated she had "mini strokes"   Vitamin D deficiency 04/22/2014    Current Outpatient Medications (Endocrine & Metabolic):    denosumab (PROLIA) 60 MG/ML SOSY injection, Inject 60 mg into the skin every 6 (six) months.   empagliflozin (JARDIANCE) 10 MG TABS tablet, Take 1 tablet (10 mg total) by mouth daily before breakfast. NEEDS FOLLOW UP APPOINTMENT FOR MORE REFILLS  Current Outpatient Medications (Cardiovascular):    furosemide (LASIX) 20 MG tablet, TAKE 3 TABLETS BY MOUTH DAILY   hydrALAZINE (APRESOLINE) 100 MG tablet, Take 1 tablet (100 mg total) by mouth 3 (three) times daily.   rosuvastatin (CRESTOR) 40 MG tablet, Take 1 tablet (40 mg total) by mouth daily.   Current Outpatient Medications (Analgesics):    aspirin 81 MG EC tablet, Take 81 mg by mouth daily.   Current Outpatient Medications (Other):    Calcium Carb-Cholecalciferol (CALCIUM 500 +D) 500-400 MG-UNIT TABS, Take 1 tablet by mouth 2 (two) times daily.   lidocaine (LIDODERM) 5 %, Place 1 patch onto the skin daily. Remove & Discard patch within 12 hours or as directed by MD   Liniments (  BLUE-EMU SUPER STRENGTH EX), Apply 1 application topically in the morning and at bedtime.   pantoprazole (PROTONIX) 40 MG tablet, TAKE 1 TABLET BY MOUTH DAILY   PHENobarbital (LUMINAL) 64.8 MG tablet, Take 2 tablets (129.6 mg total) by mouth at bedtime.   potassium chloride (KLOR-CON) 10 MEQ tablet, TAKE 1 TABLET(10 MEQ) BY MOUTH DAILY   zonisamide (ZONEGRAN) 100 MG capsule, TAKE 2 CAPSULES BY MOUTH AT  NIGHT  Review of Systems:  Review  of system negative unless stated in the problem list or HPI.    Physical Exam:  Vitals:   06/14/22 1014  BP: (!) 165/65  Pulse: 63  Temp: 97.8 F (36.6 C)  TempSrc: Oral  SpO2: 100%  Height: 5\' 1"  (1.549 m)   Physical Exam General: NAD HENT: NCAT Lungs: CTAB, no wheeze, rhonchi or rales.  Cardiovascular: systolic murmur IV/VI present, good radial pulses, No JVD Abdomen: No TTP, normal bowel sounds MSK: No asymmetry or muscle atrophy, significant lower extremity edema below the knee. Non pitting. No demarcation noted (see image).  Skin: no lesions noted on exposed skin Neuro: Alert and oriented x4. CN grossly intact Psych: Normal mood and normal affect   Assessment & Plan:   Swelling of lower extremity Pt presents in-person for evaluation of her feet and leg swelling. She also has pain in her feet. Given pt has bilateral diffuse non-pitting edema in her lower extremities that has been present for multiple weeks, ddx includes renal failure vs heart failure exacerbation vs angioedema vs cirrhosis vs hypothyroid vs cellulitis. Advised pt she needs to be admitted given her complex hx to find out the underlying etiology of her lower extremity swelling given it could be renal failure. Pt refused stating she does not want to be admitted and from her talks with palliative care team, she would like to maximize her time at home. I spoke regarding ACP and filled out MOST form. Pt's physical exam without signs of volume overload (no rales, no jvd or pitting edema). Weight unable to be obtained due to pt unable to stand. Labs obtained including CBC, CMP, BNP, TSH, and urinary protein: creatinine ratio and showed no leukocytosis, normal TSH, normal LFTs with mild hypoalbuminemia making cellulitis, hypothyroidism, and cirrhosis less likely. BNP is elevated but at baseline or slightly improvement from prior making heart failure exacerbation less likely. Creatinine is at baseline making acute renal  failure an unlikely explanation for her lower extremity swelling. Pt was started on lisinopril in 03/2022 and per husband pt's leg have been swelling one week after that. Given this, angioedema appears to be likely. Pt's edema not affecting her breathing or other vital functions. Plan to stop lisinopril and restart her amlodipine. Given strict ED precautions, and one week follow up. C3, C4 and C1 esterase inhibitor level added.    Advance Care Planning Conversation  Pertinent diagnosis: Heart Failure and CKDIV  The patient and/or family consented to a voluntary Advance Care Planning Conversation. Individuals present for the conversation:  Summary of the conversation: Pt wants to maximize quality time at home . She does not want hospice care and wants all treatments but wants to minimize hospitalizations.   Outcome of the conversations and/or documents completed: MOST form completed.   I spent 22 minutes providing separately identifiable ACP services with the patient and/or surrogate decision maker in a voluntary, in-person conversation discussing the patient's wishes and goals as detailed in the above note.  Gwenevere Abbot, MD   See Encounters Tab for problem based  charting.  Patient Discussed with Dr. Karrie Meres, MD Eligha Bridegroom. [facility] Internal Medicine Residency, PGY-2

## 2022-06-14 NOTE — Patient Instructions (Signed)
Sherry [name], it was a pleasure seeing you today! You endorsed feeling well today. Below are some of the things we talked about this visit. We look forward to seeing you in the follow up appointment!  Today we discussed: You presented for lower extremity swelling. I wanted you to get admitted to the hospital to get this figured out but you do not want to be admitted.  I am going to stop your lisinopril. Please start taking your amlodipine back again.  I am ordering some lab test and will give you a call back with results.  We filled out a most form and I will give you a copy.   I have ordered the following labs today:   Lab Orders         Brain natriuretic peptide         CBC with Diff         CMP14 + Anion Gap         Protein / Creatinine Ratio, Urine       Referrals ordered today:   Referral Orders  No referral(s) requested today     I have ordered the following medication/changed the following medications:   Stop the following medications: Medications Discontinued During This Encounter  Medication Reason   lisinopril (ZESTRIL) 5 MG tablet Duplicate     Start the following medications: No orders of the defined types were placed in this encounter.    Follow-up: 1 week follow up   Please make sure to arrive 15 minutes prior to your next appointment. If you arrive late, you may be asked to reschedule.   We look forward to seeing you next time. Please call our clinic at [phone] if you have any questions or concerns. The best time to call is Monday-Friday from 9am-4pm, but there is someone available 24/7. If after hours or the weekend, call the main hospital number and ask for the Internal Medicine Resident On-Call. If you need medication refills, please notify your pharmacy one week in advance and they will send [place] a request.  Thank you for letting us take part in your care. Wishing you the best!  Thank you, Gwenevere Abbot, MD

## 2022-06-15 NOTE — Assessment & Plan Note (Signed)
Pt presents in-person for evaluation of her feet and leg swelling. She also has pain in her feet. Given pt has bilateral diffuse non-pitting edema in her lower extremities that has been present for multiple weeks, ddx includes renal failure vs heart failure exacerbation vs angioedema vs cirrhosis vs hypothyroid vs cellulitis. Advised pt she needs to be admitted given her complex hx to find out the underlying etiology of her lower extremity swelling given it could be renal failure. Pt refused stating she does not want to be admitted and from her talks with palliative care team, she would like to maximize her time at home. I spoke regarding ACP and filled out MOST form. Pt's physical exam without signs of volume overload (no rales, no jvd or pitting edema). Weight unable to be obtained due to pt unable to stand. Labs obtained including CBC, CMP, BNP, TSH, and urinary protein: creatinine ratio and showed no leukocytosis, normal TSH, normal LFTs with mild hypoalbuminemia making cellulitis, hypothyroidism, and cirrhosis less likely. BNP is elevated but at baseline or slightly improvement from prior making heart failure exacerbation less likely. Creatinine is at baseline making acute renal failure an unlikely explanation for her lower extremity swelling. Pt was started on lisinopril in 03/2022 and per husband pt's leg have been swelling one week after that. Given this, angioedema appears to be likely. Pt's edema not affecting her breathing or other vital functions. Plan to stop lisinopril and restart her amlodipine. Given strict ED precautions, and one week follow up. C3, C4 and C1 esterase inhibitor level added. C1 esteraste inhibitor elevated, which is inconsistent with hereditary angioedema. Will continue to hold lisinopril given improvement in edema.

## 2022-06-16 MED ORDER — AMLODIPINE BESYLATE 10 MG PO TABS
10.0000 mg | ORAL_TABLET | Freq: Every day | ORAL | 11 refills | Status: DC
Start: 2022-06-16 — End: 2022-09-10

## 2022-06-16 NOTE — Addendum Note (Signed)
Addended by: Gwenevere Abbot on: 06/16/2022 12:07 PM   Modules accepted: Orders

## 2022-06-17 LAB — C1 ESTERASE INHIBITOR: C1INH SerPl-mCnc: 56 mg/dL — ABNORMAL HIGH (ref 21–39)

## 2022-06-17 NOTE — Addendum Note (Signed)
Addended by: Dickie La on: 06/17/2022 03:49 PM   Modules accepted: Level of Service

## 2022-06-17 NOTE — Progress Notes (Signed)
Internal Medicine Clinic Attending  Case discussed with Dr. Khan  At the time of the visit.  We reviewed the resident's history and exam and pertinent patient test results.  I agree with the assessment, diagnosis, and plan of care documented in the resident's note.  

## 2022-06-17 NOTE — Progress Notes (Signed)
Internal Medicine Clinic Attending  I saw and evaluated the patient.  I personally confirmed the key portions of the history and exam documented by the resident  and I reviewed pertinent patient test results.  The assessment, diagnosis, and plan were formulated together and I agree with the documentation in the resident's note.  

## 2022-06-18 LAB — C3 AND C4
C3 Complement: 137 mg/dL (ref 82–167)
Complement C4, Body Fluid: 63 mg/dL — ABNORMAL HIGH (ref 12–38)

## 2022-06-25 DIAGNOSIS — H524 Presbyopia: Secondary | ICD-10-CM | POA: Diagnosis not present

## 2022-06-25 DIAGNOSIS — H25813 Combined forms of age-related cataract, bilateral: Secondary | ICD-10-CM | POA: Diagnosis not present

## 2022-06-25 DIAGNOSIS — E119 Type 2 diabetes mellitus without complications: Secondary | ICD-10-CM | POA: Diagnosis not present

## 2022-06-25 DIAGNOSIS — H40013 Open angle with borderline findings, low risk, bilateral: Secondary | ICD-10-CM | POA: Diagnosis not present

## 2022-06-25 DIAGNOSIS — H35033 Hypertensive retinopathy, bilateral: Secondary | ICD-10-CM | POA: Diagnosis not present

## 2022-06-28 ENCOUNTER — Telehealth: Payer: Self-pay

## 2022-06-28 NOTE — Telephone Encounter (Signed)
1735- Palliative Care Note  Palliative Care check in call attempted by volunteer. 1st cal, person hung up after volunteer stated, "I'm calling from [facility]." 2nd attempt- no answer and no option for voicemail.   Attempt #1  Barbette Merino, RN

## 2022-07-18 DIAGNOSIS — R5383 Other fatigue: Secondary | ICD-10-CM | POA: Diagnosis not present

## 2022-07-18 DIAGNOSIS — M2540 Effusion, unspecified joint: Secondary | ICD-10-CM | POA: Diagnosis not present

## 2022-07-18 DIAGNOSIS — M79642 Pain in left hand: Secondary | ICD-10-CM | POA: Diagnosis not present

## 2022-07-18 DIAGNOSIS — R768 Other specified abnormal immunological findings in serum: Secondary | ICD-10-CM | POA: Diagnosis not present

## 2022-07-18 DIAGNOSIS — D649 Anemia, unspecified: Secondary | ICD-10-CM | POA: Diagnosis not present

## 2022-07-18 DIAGNOSIS — M79641 Pain in right hand: Secondary | ICD-10-CM | POA: Diagnosis not present

## 2022-07-18 DIAGNOSIS — M2560 Stiffness of unspecified joint, not elsewhere classified: Secondary | ICD-10-CM | POA: Diagnosis not present

## 2022-07-24 ENCOUNTER — Telehealth: Payer: Self-pay

## 2022-07-24 NOTE — Telephone Encounter (Signed)
1629 Palliative Care Note  Volunteer called pt yesterday 07/23/22 for palliative care check in. "Spoke with Sherry [name].  She still has swelling but it is better.  Her pain has eased up.  She saw her primary doctor and he wanted to hospitalize her for a low heart rate.  She didn't want to go.  States she is better now.  She has the [facility] number and wants to receive future calls."  Barbette Merino, RN

## 2022-08-07 ENCOUNTER — Other Ambulatory Visit: Payer: Self-pay | Admitting: Neurology

## 2022-08-19 ENCOUNTER — Telehealth: Payer: Self-pay | Admitting: *Deleted

## 2022-08-19 NOTE — Telephone Encounter (Signed)
Patient's daughter called in stating patient has been having swelling in both legs x 1 week. This is making it difficult for her to sleep. Denies SHOB. States she is taking all meds as prescribed including lasix 60 mg daily. Offered appt for this PM. Daughter declined as she needs to call 3 days ahead for transportation. Appt scheduled for 7/9. Advised daughter to take patient directly to ED if she develops [facility]. States she will.  3 way call with patient. States swelling in legs up to thighs, back and breasts has been present x 2 months. Denies SHOB. States she sleeps on couch as she cannot get into and out of bed. Uses 2 pillows on couch. Advised to call 911 if she develops SHOB. States she will.

## 2022-08-22 ENCOUNTER — Other Ambulatory Visit (HOSPITAL_COMMUNITY): Payer: Self-pay | Admitting: Family Medicine

## 2022-08-27 ENCOUNTER — Encounter: Payer: Self-pay | Admitting: Student

## 2022-08-27 ENCOUNTER — Telehealth: Payer: Self-pay | Admitting: Student

## 2022-08-27 ENCOUNTER — Ambulatory Visit (INDEPENDENT_AMBULATORY_CARE_PROVIDER_SITE_OTHER): Payer: 59 | Admitting: Student

## 2022-08-27 VITALS — BP 161/84 | HR 79 | Temp 97.8°F

## 2022-08-27 DIAGNOSIS — Z87891 Personal history of nicotine dependence: Secondary | ICD-10-CM

## 2022-08-27 DIAGNOSIS — N184 Chronic kidney disease, stage 4 (severe): Secondary | ICD-10-CM

## 2022-08-27 DIAGNOSIS — N390 Urinary tract infection, site not specified: Secondary | ICD-10-CM

## 2022-08-27 DIAGNOSIS — I5032 Chronic diastolic (congestive) heart failure: Secondary | ICD-10-CM

## 2022-08-27 DIAGNOSIS — N3000 Acute cystitis without hematuria: Secondary | ICD-10-CM

## 2022-08-27 DIAGNOSIS — R300 Dysuria: Secondary | ICD-10-CM

## 2022-08-27 HISTORY — DX: Hypocalcemia: E83.51

## 2022-08-27 LAB — COMPREHENSIVE METABOLIC PANEL
ALT: 28 U/L (ref 0–44)
AST: 41 U/L (ref 15–41)
Albumin: 2.7 g/dL — ABNORMAL LOW (ref 3.5–5.0)
Alkaline Phosphatase: 103 U/L (ref 38–126)
Anion gap: 11 (ref 5–15)
BUN: 59 mg/dL — ABNORMAL HIGH (ref 8–23)
CO2: 18 mmol/L — ABNORMAL LOW (ref 22–32)
Calcium: 5.8 mg/dL — CL (ref 8.9–10.3)
Chloride: 111 mmol/L (ref 98–111)
Creatinine, Ser: 3.19 mg/dL — ABNORMAL HIGH (ref 0.44–1.00)
GFR, Estimated: 15 mL/min — ABNORMAL LOW (ref >60)
Glucose, Bld: 84 mg/dL (ref 70–99)
Potassium: 3.7 mmol/L (ref 3.5–5.1)
Sodium: 140 mmol/L (ref 135–145)
Total Bilirubin: 0.6 mg/dL (ref 0.3–1.2)
Total Protein: 6.5 g/dL (ref 6.5–8.1)

## 2022-08-27 LAB — POCT URINALYSIS DIPSTICK
Bilirubin, UA: NEGATIVE
Blood, UA: NEGATIVE
Glucose, UA: NEGATIVE
Ketones, UA: NEGATIVE
Nitrite, UA: NEGATIVE
Protein, UA: POSITIVE — AB
Spec Grav, UA: 1.02 (ref 1.010–1.025)
Urobilinogen, UA: 0.2 E.U./dL
pH, UA: 5.5 (ref 5.0–8.0)

## 2022-08-27 LAB — BRAIN NATRIURETIC PEPTIDE: B Natriuretic Peptide: 2010.8 pg/mL — ABNORMAL HIGH (ref 0.0–100.0)

## 2022-08-27 MED ORDER — EMPAGLIFLOZIN 10 MG PO TABS: 10.0000 mg | ORAL_TABLET | Freq: Every day | ORAL | 0 refills | Status: DC

## 2022-08-27 MED ORDER — FUROSEMIDE 20 MG PO TABS: 60.0000 mg | ORAL_TABLET | Freq: Two times a day (BID) | ORAL | 2 refills | Status: DC

## 2022-08-27 MED ORDER — CEPHALEXIN 500 MG PO CAPS
500.0000 mg | ORAL_CAPSULE | Freq: Two times a day (BID) | ORAL | 0 refills | Status: DC
Start: 2022-08-27 — End: 2022-09-10

## 2022-08-27 MED ORDER — CALCIUM CARBONATE 1250 (500 CA) MG PO TABS
2.0000 | ORAL_TABLET | Freq: Two times a day (BID) | ORAL | Status: DC
Start: 2022-08-27 — End: 2022-09-10

## 2022-08-27 NOTE — Assessment & Plan Note (Signed)
Patient endorses dysuria, difficulty urinating.  Point-of-care dipstick demonstrated protein, 3+ leukocytes.  Consistent with UTI Plan Cephalexin 500 mg twice daily.

## 2022-08-27 NOTE — Telephone Encounter (Addendum)
I spoke with [name] on the phone. Patient's identity was confirmed using two patient specific identifiers.  The patient's husband and daughter were also on the phone call.  Patient consented to discussed medical issues with their family.  We spoke regarding the patient's urinalysis, the likelihood of UTI, and confirmed the dosage and use of their antibiotic.  Spoke at length with the patient CMP findings.  We discussed the worsening kidney function and how it may reflect progression of her CKD versus acute kidney injury.  We also discussed the patient's hypocalcemia, calcium 5.8 today corrected calcium 6.8.  Risks and benefits of inpatient versus outpatient management of their disease were addressed.  The patient wishes to manage her condition outpatient as much as possible.  We have close follow-up with her, she will be following up with me in the clinic on 08/30/2022.  Patient and her family said they felt comfortable continuing to manage their condition outpatient, and voiced understanding of the risks.  Specifically we spoke about the potential risk for cardiac arrhythmia including sudden cardiac death.  We once again discussed her diuresis, and how they should be taking 60 mg Lasix twice daily.  I will also call in a calcium supplement to her pharmacy which they will pick up tomorrow and begin taking.

## 2022-08-27 NOTE — Assessment & Plan Note (Addendum)
Patient has a history of chronic diastolic congestive heart failure, with extensive workup by cardiology.  She also has severe aortic stenosis.  She presented today with gross fluid overload, please see physical exam for more findings.  Patient endorses shortness of breath, but is saturating well on room air.  I feel that her volume status represents a heart failure exacerbation, compounded by the fact that she has CKD 4.  Her BNP is elevated compared to her baseline, as is her creatinine. We discussed extensively today with the patient regarding inpatient versus outpatient management of her condition.  Her ultimate goal is to try to remain as home and comfortable as much as possible if it is safe for her to do so.  We opted for outpatient management with close follow-up.  We have increased her Lasix to 60 mg twice daily, with follow-up on 08/30/2022.  We will reassess her volume status shortness of breath and metabolic panel then.  Patient was asked directly regarding whether or not they wanted to pursue inpatient management of their volume status.  The patient said that it was their wish to remain home as much as possible.  Patient said they are amicable to going to hospital if 100% necessary, and can generate improvement in their status.  Plan: Increase Lasix to 60 mg twice daily Close follow-up with our clinic Anticipatory guidance regarding when to seek additional help given, such as worsening shortness of breath, lowered oxygen saturation.

## 2022-08-27 NOTE — Progress Notes (Signed)
Subjective:  CC: Volume overload, HF, CKD  HPI:  Sherry [name] is a 77 y.o. female with a past medical history stated below and presents today for volume overload in setting of CKD, AS, HF. Please see problem based assessment and plan for additional details.  Past Medical History:  Diagnosis Date   AKI (acute kidney injury) (HCC) 01/31/2020   Anemia    Aortic stenosis    Arthritis    Bilateral lower extremity edema    Bilateral lower extremity edema    Blurry vision, bilateral 05/08/2016   Bradycardia    a. 01/2013 - asymptomatic.   Chronic diastolic heart failure (HCC)    Chronic sinus bradycardia 07/02/2012   CKD (chronic kidney disease)    a. baseline CKD stage III   Coronary artery disease    Epilepsy (HCC) 06/04/2006   EEG 02/19/12 " Interpretation:  This is an abnormal EEG demonstrating continuous and generalized slowing of the background in the theta frequency. Irregular rhythm of EKG noted.  Clinical correlation: the generalized slowing is consistent with a mild encephalopathy of nonspecific etiologies for which the differential would include infectious, toxic, metabolic, inflammatory, and vascular etiologies.    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 -Orbits: Grossly unremarkable. -Paranasal sinuses: Imaged portions clear. -Brain: No acute abnormalities such as hemorrhage, hydrocephalus, acute ischemia, or evidence of a mass lesion.  There is patchy bilateral cerebral white matter  T2 and flair signal hyper intensities.  Centimeter or smaller CSF intensity   Mobitz (type) I (Wenckebach's) atrioventricular block    a. 01/2013 - asymptomatic.   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   TIA (transient ischemic attack) 2014   pt stated she had "mini strokes"   Vitamin D deficiency 04/22/2014    Current Outpatient Medications on File Prior to Visit  Medication Sig Dispense Refill   amLODipine (NORVASC) 10 MG tablet Take 1 tablet (10 mg total) by mouth daily. 30 tablet 11   aspirin 81 MG EC tablet Take 81 mg by mouth daily.     Calcium Carb-Cholecalciferol (CALCIUM 500 +D) 500-400 MG-UNIT TABS Take 1 tablet by mouth 2 (two) times daily. 60 tablet 2   denosumab (PROLIA) 60 MG/ML SOSY injection Inject 60 mg into the skin every 6 (six) months. 1 mL 1   hydrALAZINE (APRESOLINE) 100 MG tablet Take 1 tablet (100 mg total) by mouth 3 (three) times daily. 90 tablet 11   lidocaine (LIDODERM) 5 % Place 1 patch onto the skin daily. Remove & Discard patch within 12 hours or as directed by MD 30 patch 0   Liniments (BLUE-EMU SUPER STRENGTH EX) Apply 1 application topically in the morning and at bedtime.     pantoprazole (PROTONIX) 40 MG tablet TAKE 1 TABLET BY MOUTH DAILY 100 tablet 2   PHENobarbital (  LUMINAL) 64.8 MG tablet Take 2 tablets (129.6 mg total) by mouth at bedtime. 180 tablet 3   potassium chloride (KLOR-CON) 10 MEQ tablet TAKE 1 TABLET(10 MEQ) BY MOUTH DAILY 90 tablet 0   rosuvastatin (CRESTOR) 40 MG tablet Take 1 tablet (40 mg total) by mouth daily. 90 tablet 3   zonisamide (ZONEGRAN) 100 MG capsule TAKE 2 CAPSULES BY MOUTH AT  NIGHT 200 capsule 0   No current facility-administered medications on file prior to visit.    Family History  Problem Relation Age of Onset   Hypertension Other    Heart disease  Mother    Hypertension Mother    Cancer Father    Obesity Son    Asthma Sister    Heart disease Sister    Heart disease Sister     Social History   Socioeconomic History   Marital status: Married    Spouse name: Not on file   Number of children: 2   Years of education: 4   Highest education level: Not on file  Occupational History   Occupation: retired  Tobacco Use   Smoking status: Former    Packs/day: 0.50    Years: 10.00    Additional pack years: 0.00    Total pack years: 5.00    Types: Cigarettes    Quit date: 06/03/2000    Years since quitting: 22.2   Smokeless tobacco: Never  Vaping Use   Vaping Use: Never used  Substance and Sexual Activity   Alcohol use: No    Alcohol/week: 0.0 standard drinks of alcohol   Drug use: No   Sexual activity: Not Currently    Partners: Male  Other Topics Concern   Not on file  Social History Narrative   Current Social History 06/20/2020        Patient lives with spouse in a home which is 1 story. There are not steps up to the entrance, the patient uses a ramp.       Patient's method of transportation is [employer].      The highest level of education was some high school.      The patient currently disabled.      Identified important Relationships are "God and my husband"       Pets : None       Interests / Fun: "Sitting on my front porch"       Current Stressors: None              Social Determinants of Health   Financial Resource Strain: Not on file  Food Insecurity: No Food Insecurity (10/17/2021)   Hunger Vital Sign    Worried About Running Out of Food in the Last Year: Never true    Ran Out of Food in the Last Year: Never true  Transportation Needs: No Transportation Needs (10/17/2021)   PRAPARE - Administrator, Civil Service (Medical): No    Lack of Transportation (Non-Medical): No  Physical Activity: Inactive (10/17/2021)   Exercise Vital Sign    Days of Exercise per Week: 0 days    Minutes of  Exercise per Session: 0 min  Stress: No Stress Concern Present (10/17/2021)   [employer] of Occupational Health - Occupational Stress Questionnaire    Feeling of Stress : Only a little  Social Connections: Not on file  Intimate Partner Violence: Not At Risk (10/17/2021)   Humiliation, Afraid, Rape, and Kick questionnaire    Fear of Current or Ex-Partner: No  Emotionally Abused: No    Physically Abused: No    Sexually Abused: No    Review of Systems: ROS negative except for what is noted on the assessment and plan.  Objective:   Vitals:   08/27/22 1059  BP: (!) 161/84  Pulse: 79  Temp: 97.8 F (36.6 C)  TempSrc: Oral  SpO2: 100%    Physical Exam: Constitutional: Frail appearing woman, sitting in a wheelchair. HENT: normocephalic atraumatic, mucous membranes moist Eyes: conjunctiva non-erythematous Neck: JVD elevated to above the jaw. Cardiovascular: Aortic ejection murmur consistent with aortic stenosis. pulmonary/Chest: Crackles, biphasic wheezes present MSK: Pitting edema to mid thigh, edema of the back and breasts.   Assessment & Plan:  Chronic diastolic congestive heart failure (HCC) Patient has a history of chronic diastolic congestive heart failure, with extensive workup by cardiology.  She also has severe aortic stenosis.  She presented today with gross fluid overload, please see physical exam for more findings.  Patient endorses shortness of breath, but is saturating well on room air.  I feel that her volume status represents a heart failure exacerbation, compounded by the fact that she has CKD 4.  Her BNP is elevated compared to her baseline, as is her creatinine. We discussed extensively today with the patient regarding inpatient versus outpatient management of her condition.  Her ultimate goal is to try to remain as home and comfortable as much as possible if it is safe for her to do so.  We opted for outpatient management with close follow-up.  We have  increased her Lasix to 60 mg twice daily, with follow-up on 08/30/2022.  We will reassess her volume status shortness of breath and metabolic panel then.  Plan: Increase Lasix to 60 mg twice daily Close follow-up with our clinic Anticipatory guidance regarding when to seek additional help given, such as worsening shortness of breath, lowered oxygen saturation.   UTI (urinary tract infection) Patient endorses dysuria, difficulty urinating.  Point-of-care dipstick demonstrated protein, 3+ leukocytes.  Consistent with UTI Plan Cephalexin 500 mg twice daily.  CKD (chronic kidney disease), stage IV (HCC) Patient with known history of CKD 4, CMP today revealed elevated creatinine compared to baseline.  Difficult to say if this is progression of her disease, cardiorenal syndrome, AKI in the setting of UTI. Plan: Repeat CMP next visit Diuresis with Lasix 60 mg twice daily due to volume overload.    Patient seen with Dr. Cleda Daub  Lovie Macadamia MD [facility] Internal Medicine  PGY-1 Pager: [phone]  Phone: [phone] Date 08/27/2022  Time 4:34 PM

## 2022-08-27 NOTE — Patient Instructions (Signed)
Thank you, Ms.[name] for allowing [place] to provide your care today. Today we discussed HF exacerbation .    I have ordered the following labs for you:   Lab Orders         Brain natriuretic peptide         CMP14 + Anion Gap        I have ordered the following medication/changed the following medications:   Stop the following medications: Medications Discontinued During This Encounter  Medication Reason   furosemide (LASIX) 20 MG tablet Reorder   empagliflozin (JARDIANCE) 10 MG TABS tablet Reorder     Start the following medications: Meds ordered this encounter  Medications   empagliflozin (JARDIANCE) 10 MG TABS tablet    Sig: Take 1 tablet (10 mg total) by mouth daily before breakfast. NEEDS FOLLOW UP APPOINTMENT FOR MORE REFILLS    Dispense:  90 tablet    Refill:  0    Please send a replace/new response with 100-Day Supply if appropriate to maximize member benefit. Requesting 1 year supply.   furosemide (LASIX) 20 MG tablet    Sig: Take 3 tablets (60 mg total) by mouth 2 (two) times daily.    Dispense:  300 tablet    Refill:  2    Please send a replace/new response with 100-Day Supply if appropriate to maximize member benefit. Requesting 1 year supply.   cephALEXin (KEFLEX) 500 MG capsule    Sig: Take 1 capsule (500 mg total) by mouth 2 (two) times daily for 10 days.    Dispense:  20 capsule    Refill:  0     Follow up:  2-3 days    We look forward to seeing you next time. Please call our clinic at [phone] if you have any questions or concerns. The best time to call is Monday-Friday from 9am-4pm, but there is someone available 24/7. If after hours or the weekend, call the main hospital number and ask for the Internal Medicine Resident On-Call. If you need medication refills, please notify your pharmacy one week in advance and they will send [place] a request.   Thank you for trusting me with your care. Wishing you the best!  Lovie Macadamia MD [facility]  Internal Medicine Center

## 2022-08-27 NOTE — Assessment & Plan Note (Signed)
Patient with known history of CKD 4, CMP today revealed elevated creatinine compared to baseline.  Difficult to say if this is progression of her disease, cardiorenal syndrome, AKI in the setting of UTI. Plan: Repeat CMP next visit Diuresis with Lasix 60 mg twice daily due to volume overload.

## 2022-08-28 LAB — URINALYSIS, ROUTINE W REFLEX MICROSCOPIC
Bilirubin, UA: NEGATIVE
Glucose, UA: NEGATIVE
Ketones, UA: NEGATIVE
Nitrite, UA: NEGATIVE
RBC, UA: NEGATIVE
Specific Gravity, UA: 1.013 (ref 1.005–1.030)
Urobilinogen, Ur: 0.2 mg/dL (ref 0.2–1.0)
pH, UA: 6 (ref 5.0–7.5)

## 2022-08-28 LAB — MICROSCOPIC EXAMINATION
Casts: NONE SEEN /lpf
RBC, Urine: NONE SEEN /hpf (ref 0–2)
WBC, UA: >30 /hpf — AB (ref 0–5)

## 2022-08-30 ENCOUNTER — Encounter (HOSPITAL_COMMUNITY): Payer: Self-pay

## 2022-08-30 ENCOUNTER — Other Ambulatory Visit: Payer: Self-pay

## 2022-08-30 ENCOUNTER — Telehealth: Payer: Self-pay | Admitting: *Deleted

## 2022-08-30 ENCOUNTER — Inpatient Hospital Stay (HOSPITAL_COMMUNITY)
Admission: AD | Admit: 2022-08-30 | Discharge: 2022-09-10 | DRG: 291 | Disposition: A | Discharge status: Home Health | Payer: 59 | Source: Ambulatory Visit | Attending: Internal Medicine | Admitting: Internal Medicine

## 2022-08-30 ENCOUNTER — Ambulatory Visit (INDEPENDENT_AMBULATORY_CARE_PROVIDER_SITE_OTHER): Payer: 59 | Admitting: Student

## 2022-08-30 ENCOUNTER — Observation Stay (HOSPITAL_COMMUNITY): Payer: 59

## 2022-08-30 VITALS — BP 177/96 | HR 83 | Temp 97.9°F | Ht 61.0 in

## 2022-08-30 DIAGNOSIS — I5032 Chronic diastolic (congestive) heart failure: Secondary | ICD-10-CM | POA: Diagnosis not present

## 2022-08-30 DIAGNOSIS — Z79899 Other long term (current) drug therapy: Secondary | ICD-10-CM | POA: Diagnosis not present

## 2022-08-30 DIAGNOSIS — L943 Sclerodactyly: Secondary | ICD-10-CM | POA: Diagnosis present

## 2022-08-30 DIAGNOSIS — Z87891 Personal history of nicotine dependence: Secondary | ICD-10-CM | POA: Diagnosis not present

## 2022-08-30 DIAGNOSIS — J90 Pleural effusion, not elsewhere classified: Secondary | ICD-10-CM | POA: Diagnosis not present

## 2022-08-30 DIAGNOSIS — K219 Gastro-esophageal reflux disease without esophagitis: Secondary | ICD-10-CM | POA: Diagnosis present

## 2022-08-30 DIAGNOSIS — I130 Hypertensive heart and chronic kidney disease with heart failure and stage 1 through stage 4 chronic kidney disease, or unspecified chronic kidney disease: Secondary | ICD-10-CM | POA: Diagnosis not present

## 2022-08-30 DIAGNOSIS — G8929 Other chronic pain: Secondary | ICD-10-CM | POA: Diagnosis not present

## 2022-08-30 DIAGNOSIS — R001 Bradycardia, unspecified: Secondary | ICD-10-CM | POA: Diagnosis present

## 2022-08-30 DIAGNOSIS — I350 Nonrheumatic aortic (valve) stenosis: Secondary | ICD-10-CM | POA: Diagnosis not present

## 2022-08-30 DIAGNOSIS — E038 Other specified hypothyroidism: Secondary | ICD-10-CM | POA: Diagnosis not present

## 2022-08-30 DIAGNOSIS — N184 Chronic kidney disease, stage 4 (severe): Secondary | ICD-10-CM | POA: Diagnosis not present

## 2022-08-30 DIAGNOSIS — Z8673 Personal history of transient ischemic attack (TIA), and cerebral infarction without residual deficits: Secondary | ICD-10-CM

## 2022-08-30 DIAGNOSIS — I509 Heart failure, unspecified: Secondary | ICD-10-CM | POA: Diagnosis not present

## 2022-08-30 DIAGNOSIS — Z8249 Family history of ischemic heart disease and other diseases of the circulatory system: Secondary | ICD-10-CM

## 2022-08-30 DIAGNOSIS — I441 Atrioventricular block, second degree: Secondary | ICD-10-CM | POA: Diagnosis present

## 2022-08-30 DIAGNOSIS — N189 Chronic kidney disease, unspecified: Secondary | ICD-10-CM | POA: Diagnosis not present

## 2022-08-30 DIAGNOSIS — Z90710 Acquired absence of both cervix and uterus: Secondary | ICD-10-CM

## 2022-08-30 DIAGNOSIS — E559 Vitamin D deficiency, unspecified: Secondary | ICD-10-CM | POA: Diagnosis not present

## 2022-08-30 DIAGNOSIS — Z6835 Body mass index (BMI) 35.0-35.9, adult: Secondary | ICD-10-CM

## 2022-08-30 DIAGNOSIS — Z515 Encounter for palliative care: Secondary | ICD-10-CM | POA: Diagnosis not present

## 2022-08-30 DIAGNOSIS — E878 Other disorders of electrolyte and fluid balance, not elsewhere classified: Secondary | ICD-10-CM

## 2022-08-30 DIAGNOSIS — R5381 Other malaise: Secondary | ICD-10-CM | POA: Diagnosis present

## 2022-08-30 DIAGNOSIS — Z825 Family history of asthma and other chronic lower respiratory diseases: Secondary | ICD-10-CM

## 2022-08-30 DIAGNOSIS — I10 Essential (primary) hypertension: Secondary | ICD-10-CM | POA: Diagnosis not present

## 2022-08-30 DIAGNOSIS — I5033 Acute on chronic diastolic (congestive) heart failure: Secondary | ICD-10-CM | POA: Diagnosis not present

## 2022-08-30 DIAGNOSIS — I2510 Atherosclerotic heart disease of native coronary artery without angina pectoris: Secondary | ICD-10-CM | POA: Diagnosis present

## 2022-08-30 DIAGNOSIS — I255 Ischemic cardiomyopathy: Secondary | ICD-10-CM | POA: Diagnosis not present

## 2022-08-30 DIAGNOSIS — I080 Rheumatic disorders of both mitral and aortic valves: Secondary | ICD-10-CM | POA: Diagnosis not present

## 2022-08-30 DIAGNOSIS — D72819 Decreased white blood cell count, unspecified: Secondary | ICD-10-CM | POA: Diagnosis not present

## 2022-08-30 DIAGNOSIS — Z7189 Other specified counseling: Secondary | ICD-10-CM | POA: Diagnosis not present

## 2022-08-30 DIAGNOSIS — M170 Bilateral primary osteoarthritis of knee: Secondary | ICD-10-CM | POA: Diagnosis present

## 2022-08-30 DIAGNOSIS — J45909 Unspecified asthma, uncomplicated: Secondary | ICD-10-CM | POA: Diagnosis present

## 2022-08-30 DIAGNOSIS — L89153 Pressure ulcer of sacral region, stage 3: Secondary | ICD-10-CM | POA: Diagnosis present

## 2022-08-30 DIAGNOSIS — R32 Unspecified urinary incontinence: Secondary | ICD-10-CM | POA: Diagnosis present

## 2022-08-30 DIAGNOSIS — K644 Residual hemorrhoidal skin tags: Secondary | ICD-10-CM | POA: Diagnosis present

## 2022-08-30 DIAGNOSIS — I700 Atherosclerosis of aorta: Secondary | ICD-10-CM | POA: Diagnosis not present

## 2022-08-30 DIAGNOSIS — N390 Urinary tract infection, site not specified: Secondary | ICD-10-CM | POA: Diagnosis not present

## 2022-08-30 DIAGNOSIS — M545 Low back pain, unspecified: Secondary | ICD-10-CM | POA: Diagnosis present

## 2022-08-30 DIAGNOSIS — D649 Anemia, unspecified: Secondary | ICD-10-CM | POA: Diagnosis not present

## 2022-08-30 DIAGNOSIS — I3139 Other pericardial effusion (noninflammatory): Secondary | ICD-10-CM | POA: Diagnosis present

## 2022-08-30 DIAGNOSIS — N2581 Secondary hyperparathyroidism of renal origin: Secondary | ICD-10-CM | POA: Diagnosis not present

## 2022-08-30 DIAGNOSIS — Z993 Dependence on wheelchair: Secondary | ICD-10-CM

## 2022-08-30 DIAGNOSIS — E785 Hyperlipidemia, unspecified: Secondary | ICD-10-CM | POA: Diagnosis present

## 2022-08-30 DIAGNOSIS — R809 Proteinuria, unspecified: Secondary | ICD-10-CM | POA: Diagnosis present

## 2022-08-30 DIAGNOSIS — N179 Acute kidney failure, unspecified: Secondary | ICD-10-CM | POA: Diagnosis present

## 2022-08-30 DIAGNOSIS — E669 Obesity, unspecified: Secondary | ICD-10-CM | POA: Diagnosis present

## 2022-08-30 DIAGNOSIS — G629 Polyneuropathy, unspecified: Secondary | ICD-10-CM | POA: Diagnosis present

## 2022-08-30 DIAGNOSIS — Z66 Do not resuscitate: Secondary | ICD-10-CM | POA: Diagnosis not present

## 2022-08-30 DIAGNOSIS — L89899 Pressure ulcer of other site, unspecified stage: Secondary | ICD-10-CM | POA: Diagnosis not present

## 2022-08-30 DIAGNOSIS — I5031 Acute diastolic (congestive) heart failure: Secondary | ICD-10-CM | POA: Diagnosis not present

## 2022-08-30 DIAGNOSIS — D631 Anemia in chronic kidney disease: Secondary | ICD-10-CM | POA: Diagnosis present

## 2022-08-30 DIAGNOSIS — R251 Tremor, unspecified: Secondary | ICD-10-CM | POA: Diagnosis present

## 2022-08-30 DIAGNOSIS — M62838 Other muscle spasm: Secondary | ICD-10-CM | POA: Diagnosis not present

## 2022-08-30 DIAGNOSIS — G40109 Localization-related (focal) (partial) symptomatic epilepsy and epileptic syndromes with simple partial seizures, not intractable, without status epilepticus: Secondary | ICD-10-CM | POA: Diagnosis present

## 2022-08-30 DIAGNOSIS — Z7984 Long term (current) use of oral hypoglycemic drugs: Secondary | ICD-10-CM

## 2022-08-30 DIAGNOSIS — Q251 Coarctation of aorta: Secondary | ICD-10-CM | POA: Diagnosis not present

## 2022-08-30 DIAGNOSIS — K5900 Constipation, unspecified: Secondary | ICD-10-CM | POA: Diagnosis present

## 2022-08-30 DIAGNOSIS — R011 Cardiac murmur, unspecified: Secondary | ICD-10-CM | POA: Diagnosis present

## 2022-08-30 DIAGNOSIS — E739 Lactose intolerance, unspecified: Secondary | ICD-10-CM | POA: Diagnosis present

## 2022-08-30 DIAGNOSIS — I517 Cardiomegaly: Secondary | ICD-10-CM | POA: Diagnosis not present

## 2022-08-30 DIAGNOSIS — Z886 Allergy status to analgesic agent status: Secondary | ICD-10-CM

## 2022-08-30 DIAGNOSIS — N3000 Acute cystitis without hematuria: Secondary | ICD-10-CM

## 2022-08-30 DIAGNOSIS — M810 Age-related osteoporosis without current pathological fracture: Secondary | ICD-10-CM | POA: Diagnosis present

## 2022-08-30 DIAGNOSIS — N281 Cyst of kidney, acquired: Secondary | ICD-10-CM | POA: Diagnosis not present

## 2022-08-30 DIAGNOSIS — Z7982 Long term (current) use of aspirin: Secondary | ICD-10-CM

## 2022-08-30 HISTORY — DX: Other disorders of electrolyte and fluid balance, not elsewhere classified: E87.8

## 2022-08-30 LAB — COMPREHENSIVE METABOLIC PANEL
ALT: 28 U/L (ref 0–44)
ALT: 24 U/L (ref 0–44)
AST: 41 U/L (ref 15–41)
AST: 40 U/L (ref 15–41)
Albumin: 2.6 g/dL — ABNORMAL LOW (ref 3.5–5.0)
Albumin: 2.5 g/dL — ABNORMAL LOW (ref 3.5–5.0)
Alkaline Phosphatase: 114 U/L (ref 38–126)
Alkaline Phosphatase: 103 U/L (ref 38–126)
Anion gap: 12 (ref 5–15)
Anion gap: 13 (ref 5–15)
BUN: 65 mg/dL — ABNORMAL HIGH (ref 8–23)
BUN: 65 mg/dL — ABNORMAL HIGH (ref 8–23)
CO2: 18 mmol/L — ABNORMAL LOW (ref 22–32)
CO2: 16 mmol/L — ABNORMAL LOW (ref 22–32)
Calcium: 6.1 mg/dL — CL (ref 8.9–10.3)
Calcium: 6.2 mg/dL — CL (ref 8.9–10.3)
Chloride: 109 mmol/L (ref 98–111)
Chloride: 111 mmol/L (ref 98–111)
Creatinine, Ser: 3.34 mg/dL — ABNORMAL HIGH (ref 0.44–1.00)
Creatinine, Ser: 3.3 mg/dL — ABNORMAL HIGH (ref 0.44–1.00)
GFR, Estimated: 14 mL/min — ABNORMAL LOW (ref >60)
GFR, Estimated: 14 mL/min — ABNORMAL LOW (ref >60)
Glucose, Bld: 85 mg/dL (ref 70–99)
Glucose, Bld: 99 mg/dL (ref 70–99)
Potassium: 3.6 mmol/L (ref 3.5–5.1)
Potassium: 3.8 mmol/L (ref 3.5–5.1)
Sodium: 139 mmol/L (ref 135–145)
Sodium: 140 mmol/L (ref 135–145)
Total Bilirubin: 0.4 mg/dL (ref 0.3–1.2)
Total Bilirubin: 0.5 mg/dL (ref 0.3–1.2)
Total Protein: 6.7 g/dL (ref 6.5–8.1)
Total Protein: 6.3 g/dL — ABNORMAL LOW (ref 6.5–8.1)

## 2022-08-30 LAB — CBC
HCT: 26.2 % — ABNORMAL LOW (ref 36.0–46.0)
Hemoglobin: 7.8 g/dL — ABNORMAL LOW (ref 12.0–15.0)
MCH: 28.1 pg (ref 26.0–34.0)
MCHC: 29.8 g/dL — ABNORMAL LOW (ref 30.0–36.0)
MCV: 94.2 fL (ref 80.0–100.0)
Platelets: 300 10*3/uL (ref 150–400)
RBC: 2.78 MIL/uL — ABNORMAL LOW (ref 3.87–5.11)
RDW: 15.6 % — ABNORMAL HIGH (ref 11.5–15.5)
WBC: 2.2 10*3/uL — ABNORMAL LOW (ref 4.0–10.5)
nRBC: 0 % (ref 0.0–0.2)

## 2022-08-30 LAB — CBC WITH DIFFERENTIAL/PLATELET
Abs Immature Granulocytes: 0.02 10*3/uL (ref 0.00–0.07)
Basophils Absolute: 0 10*3/uL (ref 0.0–0.1)
Basophils Relative: 1 %
Eosinophils Absolute: 0.1 10*3/uL (ref 0.0–0.5)
Eosinophils Relative: 3 %
HCT: 27.1 % — ABNORMAL LOW (ref 36.0–46.0)
Hemoglobin: 8.2 g/dL — ABNORMAL LOW (ref 12.0–15.0)
Immature Granulocytes: 1 %
Lymphocytes Relative: 16 %
Lymphs Abs: 0.3 10*3/uL — ABNORMAL LOW (ref 0.7–4.0)
MCH: 29.1 pg (ref 26.0–34.0)
MCHC: 30.3 g/dL (ref 30.0–36.0)
MCV: 96.1 fL (ref 80.0–100.0)
Monocytes Absolute: 0.4 10*3/uL (ref 0.1–1.0)
Monocytes Relative: 20 %
Neutro Abs: 1.2 10*3/uL — ABNORMAL LOW (ref 1.7–7.7)
Neutrophils Relative %: 59 %
Platelets: 268 10*3/uL (ref 150–400)
RBC: 2.82 MIL/uL — ABNORMAL LOW (ref 3.87–5.11)
RDW: 15.6 % — ABNORMAL HIGH (ref 11.5–15.5)
WBC: 2 10*3/uL — ABNORMAL LOW (ref 4.0–10.5)
nRBC: 0 % (ref 0.0–0.2)

## 2022-08-30 LAB — URINALYSIS, ROUTINE W REFLEX MICROSCOPIC
Bacteria, UA: NONE SEEN
Bilirubin Urine: NEGATIVE
Glucose, UA: NEGATIVE mg/dL
Hgb urine dipstick: NEGATIVE
Ketones, ur: NEGATIVE mg/dL
Nitrite: NEGATIVE
Protein, ur: 30 mg/dL — AB
Specific Gravity, Urine: 1.008 (ref 1.005–1.030)
pH: 5 (ref 5.0–8.0)

## 2022-08-30 LAB — PHENOBARBITAL LEVEL: Phenobarbital: 26.1 ug/mL (ref 15.0–40.0)

## 2022-08-30 LAB — SODIUM, URINE, RANDOM: Sodium, Ur: 84 mmol/L

## 2022-08-30 LAB — T4, FREE: Free T4: 0.81 ng/dL (ref 0.61–1.12)

## 2022-08-30 LAB — PHOSPHORUS: Phosphorus: 5 mg/dL — ABNORMAL HIGH (ref 2.5–4.6)

## 2022-08-30 LAB — VITAMIN D 25 HYDROXY (VIT D DEFICIENCY, FRACTURES): Vit D, 25-Hydroxy: 19.12 ng/mL — ABNORMAL LOW (ref 30–100)

## 2022-08-30 LAB — TSH: TSH: 6.844 u[IU]/mL — ABNORMAL HIGH (ref 0.350–4.500)

## 2022-08-30 LAB — MAGNESIUM: Magnesium: 2 mg/dL (ref 1.7–2.4)

## 2022-08-30 LAB — BRAIN NATRIURETIC PEPTIDE: B Natriuretic Peptide: 2133.4 pg/mL — ABNORMAL HIGH (ref 0.0–100.0)

## 2022-08-30 MED ORDER — ROSUVASTATIN CALCIUM 20 MG PO TABS
40.0000 mg | ORAL_TABLET | Freq: Every day | ORAL | Status: DC
  Administered 2022-08-30 – 2022-09-10 (×12): 40 mg via ORAL
  Filled 2022-08-30 (×13): qty 2

## 2022-08-30 MED ORDER — FUROSEMIDE 10 MG/ML IJ SOLN
80.0000 mg | Freq: Once | INTRAMUSCULAR | Status: AC
  Administered 2022-08-30: 80 mg via INTRAVENOUS
  Filled 2022-08-30: qty 8

## 2022-08-30 MED ORDER — SODIUM CHLORIDE 0.9 % IV SOLN: 4.0000 g | Freq: Once | INTRAVENOUS | Status: DC

## 2022-08-30 MED ORDER — ACETAMINOPHEN 325 MG PO TABS
650.0000 mg | ORAL_TABLET | Freq: Four times a day (QID) | ORAL | Status: DC | PRN
  Administered 2022-09-01 – 2022-09-10 (×9): 650 mg via ORAL
  Filled 2022-08-30 (×9): qty 2

## 2022-08-30 MED ORDER — CALCIUM GLUCONATE-NACL 2-0.675 GM/100ML-% IV SOLN
2.0000 g | Freq: Once | INTRAVENOUS | Status: AC
  Administered 2022-08-30: 2000 mg via INTRAVENOUS
  Filled 2022-08-30: qty 100

## 2022-08-30 MED ORDER — CALCIUM GLUCONATE-NACL 2-0.675 GM/100ML-% IV SOLN
2.0000 g | Freq: Once | INTRAVENOUS | Status: DC
  Filled 2022-08-30: qty 100

## 2022-08-30 MED ORDER — PHENOBARBITAL 32.4 MG PO TABS
129.6000 mg | ORAL_TABLET | Freq: Every day | ORAL | Status: DC
  Administered 2022-08-30 – 2022-09-09 (×11): 129.6 mg via ORAL
  Filled 2022-08-30 (×11): qty 4

## 2022-08-30 MED ORDER — SODIUM CHLORIDE 0.9 % IV SOLN
4.0000 g | Freq: Once | INTRAVENOUS | Status: DC
  Filled 2022-08-30: qty 40

## 2022-08-30 MED ORDER — ZONISAMIDE 100 MG PO CAPS
200.0000 mg | ORAL_CAPSULE | Freq: Every day | ORAL | Status: DC
  Administered 2022-08-30 – 2022-09-09 (×11): 200 mg via ORAL
  Filled 2022-08-30 (×12): qty 2

## 2022-08-30 MED ORDER — ACETAMINOPHEN 650 MG RE SUPP: 650.0000 mg | Freq: Four times a day (QID) | RECTAL | Status: DC | PRN

## 2022-08-30 MED ORDER — CEPHALEXIN 250 MG PO CAPS
500.0000 mg | ORAL_CAPSULE | Freq: Two times a day (BID) | ORAL | Status: AC
  Administered 2022-08-30 – 2022-09-01 (×4): 500 mg via ORAL
  Filled 2022-08-30 (×4): qty 2

## 2022-08-30 MED ORDER — POLYETHYLENE GLYCOL 3350 17 G PO PACK
17.0000 g | PACK | Freq: Every day | ORAL | Status: DC | PRN
  Administered 2022-08-31 – 2022-09-01 (×2): 17 g via ORAL
  Filled 2022-08-30 (×2): qty 1

## 2022-08-30 MED ORDER — ALBUTEROL SULFATE (2.5 MG/3ML) 0.083% IN NEBU: 2.5000 mg | INHALATION_SOLUTION | RESPIRATORY_TRACT | Status: DC | PRN

## 2022-08-30 MED ORDER — CALCIUM CARBONATE 1250 (500 CA) MG PO TABS
1000.0000 mg | ORAL_TABLET | Freq: Once | ORAL | Status: AC
  Administered 2022-08-30: 2500 mg via ORAL
  Filled 2022-08-30: qty 2

## 2022-08-30 MED ORDER — ENOXAPARIN SODIUM 30 MG/0.3ML IJ SOSY
30.0000 mg | PREFILLED_SYRINGE | INTRAMUSCULAR | Status: DC
  Administered 2022-08-30 – 2022-09-09 (×11): 30 mg via SUBCUTANEOUS
  Filled 2022-08-30 (×11): qty 0.3

## 2022-08-30 MED ORDER — PANTOPRAZOLE SODIUM 40 MG PO TBEC
40.0000 mg | DELAYED_RELEASE_TABLET | Freq: Every day | ORAL | Status: DC
  Administered 2022-08-30 – 2022-09-10 (×12): 40 mg via ORAL
  Filled 2022-08-30 (×12): qty 1

## 2022-08-30 MED ORDER — CEPHALEXIN 250 MG PO CAPS: 500.0000 mg | ORAL_CAPSULE | Freq: Two times a day (BID) | ORAL | Status: DC

## 2022-08-30 MED ORDER — NEPRO/CARBSTEADY PO LIQD
237.0000 mL | Freq: Two times a day (BID) | ORAL | Status: DC
  Administered 2022-08-31 – 2022-09-02 (×5): 237 mL via ORAL

## 2022-08-30 NOTE — Telephone Encounter (Signed)
Received call from nursing.  Will have patient admitted to the hospital for treatment.

## 2022-08-30 NOTE — Assessment & Plan Note (Signed)
Patient last seen on 07/09 for volume overload in the setting of CHF, AKI on CKD. Lasix was increased to 60mg  BID. She says she has been peeing a lot more than normal on her increased Lasix dose, and notes some subjective improvement in her swelling. However, her BNP in increased from 2010 last visit to 2133 this visit. On our exam today, the patient is still grossly fluid overloaded. Its is difficult to assess if she has had any improvement in her volume status due to degree of fluid overload.  She denies SOB, cough. On exam, there is 1+ pitting edema to the mid thigh, as well as pitting edema of the hands and back. JVD is elevated to above the jaw.

## 2022-08-30 NOTE — Progress Notes (Signed)
Patient transported to Admitting [facility] waiting to await bed for admission.  Patient accompanied by husband.  Admitting and [facility] aware that patient is waiting for call to go to [facility].  Patient was cleared by physician to go to the wait for bed at Admissions.

## 2022-08-30 NOTE — Assessment & Plan Note (Signed)
Patient hypertensive to 177/96 today. Denies having taken her medication because she didn't want to have to pee when going to and from Drs appointment today. Currently on amlodipine 10, hydralazine 100, Lasix 60 BID, none of which she took this AM.

## 2022-08-30 NOTE — Progress Notes (Signed)
Subjective:  CC: Volume overload, CHF, AKI on CKD, UTI, Hypocalcemia   HPI:  [name] is a 77 y.o. female with a past medical history stated below and presents today for Volume overload, CHF, AKI on CKD, UTI, Hypocalcemia . Please see problem based assessment and plan for additional details.  Past Medical History:  Diagnosis Date   AKI (acute kidney injury) (HCC) 01/31/2020   Anemia    Aortic stenosis    Arthritis    Bilateral lower extremity edema    Bilateral lower extremity edema    Blurry vision, bilateral 05/08/2016   Bradycardia    a. 01/2013 - asymptomatic.   Chronic diastolic heart failure (HCC)    Chronic sinus bradycardia 07/02/2012   CKD (chronic kidney disease)    a. baseline CKD stage III   Coronary artery disease    Epilepsy (HCC) 06/04/2006   EEG 02/19/12 " Interpretation:  This is an abnormal EEG demonstrating continuous and generalized slowing of the background in the theta frequency. Irregular rhythm of EKG noted.  Clinical correlation: the generalized slowing is consistent with a mild encephalopathy of nonspecific etiologies for which the differential would include infectious, toxic, metabolic, inflammatory, and vascular etiologies.    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 -Orbits: Grossly unremarkable. -Paranasal sinuses: Imaged portions clear. -Brain: No acute abnormalities such as hemorrhage, hydrocephalus, acute ischemia, or evidence of a mass lesion.  There  is patchy bilateral cerebral white matter T2 and flair signal hyper intensities.  Centimeter or smaller CSF intensity   Mobitz (type) I (Wenckebach's) atrioventricular block    a. 01/2013 - asymptomatic.   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   TIA (transient ischemic attack) 2014   pt stated she had "mini strokes"   Vitamin D deficiency 04/22/2014    No current facility-administered medications on file prior to visit.   Current Outpatient Medications on File Prior to Visit  Medication Sig Dispense Refill   amLODipine (NORVASC) 10 MG tablet Take 1 tablet (10 mg total) by mouth daily. 30 tablet 11   aspirin 81 MG EC tablet Take 81 mg by mouth daily.     Calcium Carb-Cholecalciferol (CALCIUM 500 +D) 500-400 MG-UNIT TABS Take 1 tablet by mouth 2 (two) times daily. 60 tablet 2   cephALEXin (KEFLEX) 500 MG capsule Take 1 capsule (500 mg total) by mouth 2 (two) times daily for 10 days. 20 capsule 0   denosumab (PROLIA) 60 MG/ML SOSY injection Inject 60 mg into the skin every 6 (six) months. 1 mL 1   empagliflozin (JARDIANCE) 10 MG TABS tablet Take 1 tablet (10 mg total) by mouth daily before breakfast. NEEDS FOLLOW UP APPOINTMENT FOR MORE REFILLS 90 tablet 0   furosemide (LASIX) 20 MG tablet Take 3 tablets (60 mg total) by mouth  2 (two) times daily. 300 tablet 2   hydrALAZINE (APRESOLINE) 100 MG tablet Take 1 tablet (100 mg total) by mouth 3 (three) times daily. 90 tablet 11   lidocaine (LIDODERM) 5 % Place 1 patch onto the skin daily. Remove & Discard patch within 12 hours or as directed by MD 30 patch 0   Liniments (BLUE-EMU SUPER STRENGTH EX) Apply 1 application topically in the morning and at bedtime.     pantoprazole (PROTONIX) 40 MG tablet TAKE 1 TABLET BY MOUTH DAILY 100 tablet 2   PHENobarbital (LUMINAL) 64.8 MG tablet Take 2  tablets (129.6 mg total) by mouth at bedtime. 180 tablet 3   potassium chloride (KLOR-CON) 10 MEQ tablet TAKE 1 TABLET(10 MEQ) BY MOUTH DAILY 90 tablet 0   rosuvastatin (CRESTOR) 40 MG tablet Take 1 tablet (40 mg total) by mouth daily. 90 tablet 3   zonisamide (ZONEGRAN) 100 MG capsule TAKE 2 CAPSULES BY MOUTH AT  NIGHT 200 capsule 0    Family History  Problem Relation Age of Onset   Hypertension Other    Heart disease Mother    Hypertension Mother    Cancer Father    Obesity Son    Asthma Sister    Heart disease Sister    Heart disease Sister     Social History   Socioeconomic History   Marital status: Married    Spouse name: Not on file   Number of children: 2   Years of education: 4   Highest education level: Not on file  Occupational History   Occupation: retired  Tobacco Use   Smoking status: Former    Current packs/day: 0.00    Average packs/day: 0.5 packs/day for 10.0 years (5.0 ttl pk-yrs)    Types: Cigarettes    Start date: 06/04/1990    Quit date: 06/03/2000    Years since quitting: 22.2   Smokeless tobacco: Never  Vaping Use   Vaping status: Never Used  Substance and Sexual Activity   Alcohol use: No    Alcohol/week: 0.0 standard drinks of alcohol   Drug use: No   Sexual activity: Not Currently    Partners: Male  Other Topics Concern   Not on file  Social History Narrative   Current Social History 06/20/2020        Patient lives with spouse in a home which is 1 story. There are not steps up to the entrance, the patient uses a ramp.       Patient's method of transportation is [employer].      The highest level of education was some high school.      The patient currently disabled.      Identified important Relationships are "God and my husband"       Pets : None       Interests / Fun: "Sitting on my front porch"       Current Stressors: None              Social Determinants of Health   Financial Resource Strain: Not on file  Food Insecurity: No  Food Insecurity (08/30/2022)   Hunger Vital Sign    Worried About Running Out of Food in the Last Year: Never true    Ran Out of Food in the Last Year: Never true  Transportation Needs: No Transportation Needs (08/30/2022)   PRAPARE - Administrator, Civil Service (Medical): No    Lack of Transportation (Non-Medical): No  Physical Activity: Inactive (10/17/2021)  Exercise Vital Sign    Days of Exercise per Week: 0 days    Minutes of Exercise per Session: 0 min  Stress: No Stress Concern Present (10/17/2021)   [employer] of Occupational Health - Occupational Stress Questionnaire    Feeling of Stress : Only a little  Social Connections: Not on file  Intimate Partner Violence: Not At Risk (08/30/2022)   Humiliation, Afraid, Rape, and Kick questionnaire    Fear of Current or Ex-Partner: No    Emotionally Abused: No    Physically Abused: No    Sexually Abused: No    Review of Systems: ROS negative except for what is noted on the assessment and plan.  Objective:   Vitals:   08/30/22 1037 08/30/22 1220  BP: (!) 171/89 (!) 177/96  Pulse: 81 83  Temp: 97.9 F (36.6 C)   TempSrc: Oral   SpO2: 99%   Height: 5\' 1"  (1.549 m)     Physical Exam: Constitutional: Somnolent appearing women in no acute distress HENT: normocephalic atraumatic, mucous membranes moist Eyes: conjunctiva non-erythematous Neck: supple Cardiovascular: Grade 3 systolic ejection murmur best heard at the right upper sternal border. Elevated JVD. 1+ pitting edema to the mid thigh, as well as pitting edema of the hands and back.  Pulmonary/Chest: normal work of breathing on room air, Bibasilar crackles auscultated bilaterally Abdominal: soft, non-tender. Mild distension of the abdomen, 1+ pitting edema of the flank and back  MSK: normal bulk and tone Neurological: alert & oriented x 4, 5/5 strength in bilateral upper and lower extremities, Sensation equal and intact bilaterally Skin: warm and  dry Psych: normal mood and affect     Assessment & Plan:  Acute kidney injury superimposed on CKD (HCC) Patient was found to have elevated BUN / creatinine 59 / 3.19 on 07/09. Creatinine today increased to 65 / 3.34 today, concerning for worsening AKI.  Electrolyte abnormality CMP on 07/09 revealed calcium of 5.8 albumin 2.7, corrected calcium 6.8. We spoke with the patient and her family regarding this finding via telephone call documented on 07/09 and started 1g elemental calcium BID. On repeat CMP today, patient hypocalcemic to 6.1, albumin 2.6, corrected calcium 7.2. Patient prescribed calcium D3 in the past, but had not been taking it. Hypocalcemia likely multifactorial in the setting of loop diuretic use, CKD.   Patient had increased somnolence today on examination. She is alert and oriented x4. Neurological exam revealed symmetric strength and sensation. Family felt that she has not had any changes in mentation. Patient attributes her somnolence to fatigue from increased ambulation to the bathroom in setting of diuresis. Given the patient's hypertension, history of TIA, and electrolyte dysfunction her somnolence in clinic today concerning. While I am less concerned for CVA, I could not rule out worsening electrolyte dysfunction or uremia causing her somnolence.   UTI (urinary tract infection) Patient diagnosed with UTI on 07/09. She was having dysuria, and difficulty urinating. Urinalysis showed 3+ leukocytes, 2+ protein and numerous bacteria. We began Cephalexin 500mg  BID. Patient endorses taking this medication since last visit.   Chronic diastolic congestive heart failure (HCC) Patient last seen on 07/09 for volume overload in the setting of CHF, AKI on CKD. Lasix was increased to 60mg  BID. She says she has been peeing a lot more than normal on her increased Lasix dose, and notes some subjective improvement in her swelling. However, her BNP in increased from 2010 last visit to 2133 this  visit. On our exam today, the patient is still  grossly fluid overloaded. Its is difficult to assess if she has had any improvement in her volume status due to degree of fluid overload.  She denies SOB, cough. On exam, there is 1+ pitting edema to the mid thigh, as well as pitting edema of the hands and back. JVD is elevated to above the jaw.   Essential hypertension Patient hypertensive to 177/96 today. Denies having taken her medication because she didn't want to have to pee when going to and from Drs appointment today. Currently on amlodipine 10, hydralazine 100, Lasix 60 BID, none of which she took this AM.   Plan/disposition: Due to this patient's persistent volume overload, electrolyte derangement, and increased somnolence, we felt that a higher level of care may have been necessary for this patient.  Stat CMP, BNP were collected in the clinic. Our original hope was to send the patient home and follow up on labs later int he day. However, upon speaking to the patient regarding transportation, we learned that the patient relies on [employer] for transportation and was unsure if she would be able to return to the hospital if she were to return home.  Due to a combination of these factors, we felt that the patient was unsafe to be discharged home from our clinic.  Higher level of care was recommended and the patient was directly admitted to the inpatient internal medicine resident service.  Patient seen with Dr. Julian Reil MD [facility] Internal [facility]  PGY-1 Pager: [phone]  Phone: [phone] Date 08/30/2022  Time 4:15 PM

## 2022-08-30 NOTE — Assessment & Plan Note (Signed)
CMP on 07/09 revealed calcium of 5.8 albumin 2.7, corrected calcium 6.8. We spoke with the patient and her family regarding this finding via telephone call documented on 07/09 and started 1g elemental calcium BID. On repeat CMP today, patient hypocalcemic to 6.1, albumin 2.6, corrected calcium 7.2. Patient prescribed calcium D3 in the past, but had not been taking it. Hypocalcemia likely multifactorial in the setting of loop diuretic use, CKD.   Patient had increased somnolence today on examination. She is alert and oriented x4. Neurological exam revealed symmetric strength and sensation. Family felt that she has not had any changes in mentation. Patient attributes her somnolence to fatigue from increased ambulation to the bathroom in setting of diuresis. Given the patient's hypertension, history of TIA, and electrolyte dysfunction her somnolence in clinic today concerning. While I am less concerned for CVA, I could not rule out worsening electrolyte dysfunction or uremia causing her somnolence.

## 2022-08-30 NOTE — Progress Notes (Signed)
   08/30/22 1425  Spiritual Encounters  Type of Visit Initial  Care provided to: Family  Referral source Chaplain team  Reason for visit Routine spiritual support  OnCall Visit No  Spiritual Framework  Patient Stress Factors None identified  Family Stress Factors None identified  Interventions  Spiritual Care Interventions Made Compassionate presence   I gave Adv. Dir.  paperwork to Pt's spouse and told spouse that if they had any questions to notify us and we would assist them.

## 2022-08-30 NOTE — Telephone Encounter (Signed)
CRITICAL VALUE STICKER  CRITICAL VALUE: Critical Calcium 6.1  RECEIVER (on-site recipient of call): L. Victorino Fatzinger, BSN, RN-BC   DATE & TIME NOTIFIED: 08/30/22 at 1315  MESSENGER (representative from lab): Shay with [facility]  MD NOTIFIED: Debe Coder, MD  TIME OF NOTIFICATION:1316  RESPONSE: "Noted"  Patient is currently waiting for bed in Admitting

## 2022-08-30 NOTE — Assessment & Plan Note (Signed)
Patient diagnosed with UTI on 07/09. She was having dysuria, and difficulty urinating. Urinalysis showed 3+ leukocytes, 2+ protein and numerous bacteria. We began Cephalexin 500mg  BID. Patient endorses taking this medication since last visit.

## 2022-08-30 NOTE — Plan of Care (Signed)

## 2022-08-30 NOTE — H&P (Cosign Needed Addendum)
Date: 08/30/2022               Patient Name:  Sherry [name] MRN: [id number]  DOB: 07-19-1945 Age / Sex: 77 y.o., female   PCP: Gwenevere Abbot, MD         Medical Service: Internal Medicine Teaching Service         Attending Physician: Dr. Dickie La, MD    First Contact: Dr. Philomena Doheny Pager: [phone]  Second Contact: Dr. Morene Crocker Pager: [phone]       After Hours (After 5p/  First Contact Pager: [phone]  weekends / holidays): Second Contact Pager: [phone]   Chief Complaint: Swelling, SOB with activity   History of Present Illness:  Patient is a 77 y.o. with PMH of HTN, Type 1 AV block, Aortic Stenosis, HLD,  diastolic CHF, CKD IV, osteoporosis, seizure disorder, and history of CVA who presented as a direct admission from [facility] on 7/12.   Patient reports she has been experiencing worsening swelling in her legs, abdomen, and arms/hands for about a week. She has experienced similar swelling in he past, and has been on PO lasix 20 TID. Sometimes the swelling improves on its own, but she went to a clinic appointment at [facility] with Dr. Rosaura Carpenter on 7/9 due to worsening symptoms, including SOB with increased activity and pain with urination. She was given the option to manage her swelling in the outpatient setting or inpatient and she preferred outpatient management. She was started on lasix 60 mg BID and cephalexin 500 BID- 10 days.  Dysuria improved with antibiotic treatment, although daughter (on phone) reported that patient may have been taking the cephalexin more than twice a day. Patient and husband noted swelling has not improved despite escalating her lasix therapy.   Patient seen in clinic today by Dr. Rosaura Carpenter (please refer to his clinic note). Today the patient endorses decreased appetite, cough with mild phlegm that worsened with increased fluid in legs, and increased fatigue. Denied urinary issues, rhinorrhea, or coryza. Denied chest pain,  numbness around the mouth, muscle twitching in legs or arms, or muscle cramping. Reported she sleeps on 3 pillows otherwise feels short of breath laying down. Denies wheezing and any pre-syncopal symptoms. No changes in vision or palpitations in the chest. No pain with swallowing or neck swelling.   Chronic bilateral knee pain from osteoarthritis but no new or worsened lower extremity pain. Constipation at baseline.   PCP: Dr. Gwenevere Abbot at [facility] Cardiology: Dr. Tonny Bollman  Neurology: Dr. Patrcia Dolly at [facility]  Nephrology: Dr. Bufford Buttner    Meds:  Current Facility-Administered Medications for the 08/30/22 encounter [facility] Encounter)  Medication   calcium carbonate (OS-CAL - dosed in mg of elemental calcium) tablet 2,500 mg   No outpatient medications have been marked as taking for the 08/30/22 encounter [facility] Encounter).   Per chart review:  Amlodipine 10 daily  Aspirin 81 daily- not taking  Calcium Carb-Cholecalciferol 500-400 mg BID- not taking Calcium carbonate 2,500 mg BID (new 7/09) Cephalexin 500 mg BID (new 7/09) Denosumab 60 mg injection every 6 months Furosemide 20 mg, 3 pills BID (so 60 mg total BID) (change 7/09) Hydralazine 100 mg TID Lidocaine 5% patch - not sure Liniments topical (for pain) - not sure  Pantoprazole 40 mg  Phenobarbital 64.8 mg BID at bedtime Potassium Chloride 10 MEQ  Rosuvastatin 40 mg  Zonisamide 100 mg (x2 tablets) at bedtime  Allergies: Allergies as of 08/30/2022 -  Reviewed 08/30/2022  Allergen Reaction Noted   Ibuprofen Other (See Comments) 10/30/2012   Past Medical History:  Diagnosis Date   AKI (acute kidney injury) (HCC) 01/31/2020   Anemia    Aortic stenosis    Arthritis    Bilateral lower extremity edema    Bilateral lower extremity edema    Blurry vision, bilateral 05/08/2016   Bradycardia    a. 01/2013 - asymptomatic.   Chronic diastolic heart failure (HCC)    Chronic sinus bradycardia  07/02/2012   CKD (chronic kidney disease)    a. baseline CKD stage III   Coronary artery disease    Epilepsy (HCC) 06/04/2006   EEG 02/19/12 " Interpretation:  This is an abnormal EEG demonstrating continuous and generalized slowing of the background in the theta frequency. Irregular rhythm of EKG noted.  Clinical correlation: the generalized slowing is consistent with a mild encephalopathy of nonspecific etiologies for which the differential would include infectious, toxic, metabolic, inflammatory, and vascular etiologies.    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 -Orbits: Grossly unremarkable. -Paranasal sinuses: Imaged portions clear. -Brain: No acute abnormalities such as hemorrhage, hydrocephalus, acute ischemia, or evidence of a mass lesion.  There is patchy bilateral cerebral white matter T2 and flair signal hyper intensities.  Centimeter or smaller CSF intensity   Mobitz (type) I (Wenckebach's) atrioventricular block    a. 01/2013 - asymptomatic.   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   TIA (transient ischemic attack) 2014   pt stated she had "mini strokes"   Vitamin D deficiency 04/22/2014   Family History: per chart review Mother- HTN, heart disease Father- Cancer Sister- Heart disease, asthma Son- Obesity   Social History: from  patient interview plus chart review Lives at home with her husband, uses motorized chair for mobility. Per daughter, patient has required assistance with grooming and toileting for the last week since she's been volume overloaded. Has two children. Former smoker, 5-pack years, no current use of cigarettes, alcohol, or other drugs.    Review of Systems: A complete ROS was negative except as per HPI.   Physical Exam: Blood pressure (!) 144/91, pulse (!) 59, temperature 98.7 F (37.1 C), temperature source Oral, resp. rate 11, weight 83 kg, SpO2 100%. Consitutional: Patient resting in bed with head of bed elevated.  CV: RRR, systolic murmur audible diffusely across chest, no rubs or gallops. 2+ pitting edema up to knees bilaterally. Pitting edema bilateral hands up to biceps. JVD elevated up to the mandible.  Pulmonary: Wheezing/possible ronchi on left upper and lower lobe. Bilateral crackles. Increased respiratory effort while speaking.  Abdominal: Soft, non-tender; positive bowel sounds. Appears fluid overloaded.  MSK: Right tremor at rest on right upper extremity most obvious when hand is outstretched and flexed. Tremor not present with movement.  Neuro: Alert and oriented,  asking and answering questions appropriately with some input from husband.  Psych: Normal mood and affect.   EKG: pending  EKG from 12/12/21 showing sinus bradycardia (HR 49) with 1st degree AV block. Patient with Mobitz Type I AV block since 2014.   CXR 7/12: personally reviewed my interpretation is bilateral pleural opacities, no lobar findings indicative of pneumonia or abscess. Possible focal finding right upper lobe. Final report by radiology pending.      Latest Ref Rng & Units 08/30/2022    3:14 PM 06/14/2022   11:52 AM 06/15/2021    9:01 AM  CBC  WBC 4.0 - 10.5 K/uL 2.2  2.6  2.9   Hemoglobin 12.0 - 15.0 g/dL 7.8  9.6  09.6   Hematocrit 36.0 - 46.0 % 26.2  31.5  34.4   Platelets 150 - 400 K/uL 300  279  195         Latest Ref Rng & Units 08/30/2022   11:30 AM 08/27/2022   12:00 PM 06/14/2022   11:52 AM  CMP  Glucose 70 - 99 mg/dL 85  84  80   BUN 8 - 23 mg/dL 65  59  64   Creatinine 0.44 - 1.00 mg/dL 0.45  4.09  8.11   Sodium 135 - 145 mmol/L 139  140  135   Potassium 3.5 - 5.1 mmol/L 3.6  3.7  4.9   Chloride 98 - 111 mmol/L 109  111  107   CO2 22 - 32 mmol/L 18  18  16    Calcium 8.9 - 10.3 mg/dL 6.1  5.8  7.3   Total Protein 6.5 - 8.1 g/dL 6.7  6.5  7.5   Total Bilirubin 0.3 - 1.2 mg/dL 0.4  0.6  0.4   Alkaline Phos 38 - 126 U/L 114  103  102   AST 15 - 41 U/L 41  41  31   ALT 0 - 44 U/L 28  28  16      BNP 2,133, (2,010 7/9; 370 10 months ago with steady increase over time) Phosphate 5.0 Mg 2 TSH 6.844 Pending labs: Mg, Vitamin D 25, PTH, urine Mg, Calcium/Cr ratio, and UA    Assessment & Plan by Problem: Patient is a 77 y.o. with PMH of HTN, Type 1 AV block, Aortic Stenosis, HLD, diastolic CHF, CKD IV, osteoporosis, seizure disorder, and history of CVA who is here for hypocalcemia in the setting of acute HF exacerbation and UTI.   Principal Problem:   Hypocalcemia Patient presented to the [facility] on 7/9 with Ca of 5.8. Patient presenting with anasarca, dysuria, SOB with activity, and fatigue. Repeat Ca 7/12 of 6.1 with worsening anasarca and SOB with activity. Patient denied muscle spasms, confusion, or numbness but did endorse a new "tremor" in the right upper extremity, fatigue, and decreased appetite. Calcium Carb-Cholecalciferol 500-400 mg BID but indicated to Dr. Ledell Noss that she is not taking it. Prescribed calcium carbonate 2,500 mg BID at 7/09 clinic visit. Patient is also getting denosumab injections for osteoporosis every 6 months, with a known side effect of reduced calcium especially in patients with known kidney disease. Patient with 5-pack smoking history, CXR from 2021 for comparison, no recent CT scan of lungs.   The differential for her hypocalcemia is  quite broad given her medical comorbidities and risk factors. Will continue to work up while repleting calcium.  Plan:  - Ordered 1000 mg Calcium carbonate - Ordered 2g Calcium gluconate - Repeat ionized calcium  -  Ordered CBC with diff for 7/13   Acute on chronic HF Severe LVH Conduction disease with 2:1 AV block AS Anemia  Follows with cardiology. Last echocardiogram 11/02/21 with LV EF 60-65%, severe hypertrophy, consistent with Grade 1 diastolic dysfunction, mildly elevated pulmonary artery systolic pressure, LA moderately dilated, RA mildly dilated, Mitral valve with severe mitral annular calcification, Aortic valve tricuspid, and normal IVC. EKG 7/12 pending. History of 2:1 AV block. Home dose of diuretics may not be enough to address her chronic swelling, leading to an exacerbation of her diastolic HF. Less concern for ACS as patient denies CP, but will monitor EKG and telemetry to determine if ACS workup or cardiology consult is needed. Unlikely this patient has PE as there is no sustained tachycardia or chest pain. Of note, patient was being worked up for anemia and neutropenia in 2023 (anemia resolved).  Plan:  - IV lasix 80 mg  - Telemetry  - Daily weights - Monitor I/Os - Renal diet + feeding supplement  - Echo ordered   CKD IV AKI 2/2 UTI Patient with CKD stage IV, found to have BUN/Cr 59/3.19 on 7/09 in the setting of dysuria and UA positive for leukocytes and cloudy urine. AKI could also be due to acute heart failure or chronic high doses of diuretics. Prescribed cephalexin 500 mg BID starting on 7/09. Patient reported taking daily, but may not have taken all of her medications today. BUN/Cr 65/3.34 on 7/12 in the setting of fluid overload and acute HF exacerbation. Patient restarted amlodipine 10 mg 06/16/22 and stopped lisinopril 2/2 swelling, but on chart review neurology stopped amlodipine due to swelling. Per chart review, patient had an appointment on 10/11/21 with Dr.  Signe Colt of [facility] but cannot view note.  - Repeat UA - Added Free T4 - F/u RFP, CMP - Will hold amlodipine - Cephalexin 500 mg BID to complete a 5 day course total  - Renal diet + feeding supplement  - Consult nephrology for electrolyte abnormalities and nutrition   Chronic health conditions Seizure disorder- continue phenobarbital 129.6 mg, zonisamide/zonegran 200 mg daily; obtain phenobarbital and zonisamide levels   Chronic pain/osteoarthritis- tylenol 650 q6h PRN  Constipation- polyethylene glycol 17g daily PRN Asthma- albuterol PRN  CVA- aspirin 81 daily  GERD pantoprazole 40 mg CAD- rosuvastatin 40 mg  Osteoporosis- denosumab injection every 6 months  Prior to Admission Living Arrangement: Home, living with husband  Anticipated Discharge Location: Home Barriers to Discharge: Medical management  Dispo: Admit patient to Observation with expected stay less than 2 midnights.   Signed: Philomena Doheny, MD 08/30/2022, 4:40 PM  Pager: @MYPAGER @ After 5pm on weekdays and 1pm on weekends: On Call pager: [phone]

## 2022-08-30 NOTE — Assessment & Plan Note (Signed)
Patient was found to have elevated BUN / creatinine 59 / 3.19 on 07/09. Creatinine today increased to 65 / 3.34 today, concerning for worsening AKI.

## 2022-08-31 ENCOUNTER — Observation Stay (HOSPITAL_COMMUNITY): Payer: 59

## 2022-08-31 DIAGNOSIS — L943 Sclerodactyly: Secondary | ICD-10-CM | POA: Diagnosis present

## 2022-08-31 DIAGNOSIS — Z79899 Other long term (current) drug therapy: Secondary | ICD-10-CM | POA: Diagnosis not present

## 2022-08-31 DIAGNOSIS — G40109 Localization-related (focal) (partial) symptomatic epilepsy and epileptic syndromes with simple partial seizures, not intractable, without status epilepticus: Secondary | ICD-10-CM | POA: Diagnosis present

## 2022-08-31 DIAGNOSIS — D72819 Decreased white blood cell count, unspecified: Secondary | ICD-10-CM | POA: Diagnosis not present

## 2022-08-31 DIAGNOSIS — I5031 Acute diastolic (congestive) heart failure: Principal | ICD-10-CM | POA: Diagnosis present

## 2022-08-31 DIAGNOSIS — Z87891 Personal history of nicotine dependence: Secondary | ICD-10-CM | POA: Diagnosis not present

## 2022-08-31 DIAGNOSIS — J90 Pleural effusion, not elsewhere classified: Secondary | ICD-10-CM | POA: Diagnosis not present

## 2022-08-31 DIAGNOSIS — I080 Rheumatic disorders of both mitral and aortic valves: Secondary | ICD-10-CM | POA: Diagnosis present

## 2022-08-31 DIAGNOSIS — I350 Nonrheumatic aortic (valve) stenosis: Secondary | ICD-10-CM | POA: Diagnosis not present

## 2022-08-31 DIAGNOSIS — Z7189 Other specified counseling: Secondary | ICD-10-CM | POA: Diagnosis not present

## 2022-08-31 DIAGNOSIS — Z66 Do not resuscitate: Secondary | ICD-10-CM | POA: Diagnosis not present

## 2022-08-31 DIAGNOSIS — D649 Anemia, unspecified: Secondary | ICD-10-CM

## 2022-08-31 DIAGNOSIS — N390 Urinary tract infection, site not specified: Secondary | ICD-10-CM | POA: Diagnosis present

## 2022-08-31 DIAGNOSIS — Z515 Encounter for palliative care: Secondary | ICD-10-CM | POA: Diagnosis not present

## 2022-08-31 DIAGNOSIS — R918 Other nonspecific abnormal finding of lung field: Secondary | ICD-10-CM | POA: Diagnosis not present

## 2022-08-31 DIAGNOSIS — D631 Anemia in chronic kidney disease: Secondary | ICD-10-CM | POA: Diagnosis not present

## 2022-08-31 DIAGNOSIS — G8929 Other chronic pain: Secondary | ICD-10-CM | POA: Diagnosis present

## 2022-08-31 DIAGNOSIS — I130 Hypertensive heart and chronic kidney disease with heart failure and stage 1 through stage 4 chronic kidney disease, or unspecified chronic kidney disease: Secondary | ICD-10-CM | POA: Diagnosis not present

## 2022-08-31 DIAGNOSIS — I441 Atrioventricular block, second degree: Secondary | ICD-10-CM | POA: Diagnosis not present

## 2022-08-31 DIAGNOSIS — N179 Acute kidney failure, unspecified: Secondary | ICD-10-CM | POA: Diagnosis not present

## 2022-08-31 DIAGNOSIS — E559 Vitamin D deficiency, unspecified: Secondary | ICD-10-CM | POA: Diagnosis not present

## 2022-08-31 DIAGNOSIS — E038 Other specified hypothyroidism: Secondary | ICD-10-CM | POA: Diagnosis present

## 2022-08-31 DIAGNOSIS — R059 Cough, unspecified: Secondary | ICD-10-CM | POA: Diagnosis not present

## 2022-08-31 DIAGNOSIS — L89899 Pressure ulcer of other site, unspecified stage: Secondary | ICD-10-CM | POA: Diagnosis not present

## 2022-08-31 DIAGNOSIS — N184 Chronic kidney disease, stage 4 (severe): Secondary | ICD-10-CM | POA: Diagnosis not present

## 2022-08-31 DIAGNOSIS — I3139 Other pericardial effusion (noninflammatory): Secondary | ICD-10-CM | POA: Diagnosis not present

## 2022-08-31 DIAGNOSIS — Q251 Coarctation of aorta: Secondary | ICD-10-CM | POA: Diagnosis not present

## 2022-08-31 DIAGNOSIS — N189 Chronic kidney disease, unspecified: Secondary | ICD-10-CM | POA: Diagnosis not present

## 2022-08-31 DIAGNOSIS — Z7401 Bed confinement status: Secondary | ICD-10-CM | POA: Diagnosis not present

## 2022-08-31 DIAGNOSIS — J45909 Unspecified asthma, uncomplicated: Secondary | ICD-10-CM | POA: Diagnosis present

## 2022-08-31 DIAGNOSIS — J811 Chronic pulmonary edema: Secondary | ICD-10-CM | POA: Diagnosis not present

## 2022-08-31 DIAGNOSIS — E785 Hyperlipidemia, unspecified: Secondary | ICD-10-CM | POA: Diagnosis present

## 2022-08-31 DIAGNOSIS — I5033 Acute on chronic diastolic (congestive) heart failure: Secondary | ICD-10-CM | POA: Diagnosis not present

## 2022-08-31 DIAGNOSIS — L89153 Pressure ulcer of sacral region, stage 3: Secondary | ICD-10-CM | POA: Diagnosis not present

## 2022-08-31 DIAGNOSIS — N2581 Secondary hyperparathyroidism of renal origin: Secondary | ICD-10-CM | POA: Diagnosis present

## 2022-08-31 DIAGNOSIS — R531 Weakness: Secondary | ICD-10-CM | POA: Diagnosis not present

## 2022-08-31 DIAGNOSIS — E669 Obesity, unspecified: Secondary | ICD-10-CM | POA: Diagnosis present

## 2022-08-31 DIAGNOSIS — I255 Ischemic cardiomyopathy: Secondary | ICD-10-CM | POA: Diagnosis present

## 2022-08-31 DIAGNOSIS — I700 Atherosclerosis of aorta: Secondary | ICD-10-CM | POA: Diagnosis present

## 2022-08-31 DIAGNOSIS — M7989 Other specified soft tissue disorders: Secondary | ICD-10-CM | POA: Diagnosis not present

## 2022-08-31 HISTORY — DX: Acute diastolic (congestive) heart failure: I50.31

## 2022-08-31 LAB — GLUCOSE, CAPILLARY
Glucose-Capillary: 97 mg/dL (ref 70–99)
Glucose-Capillary: 94 mg/dL (ref 70–99)
Glucose-Capillary: 118 mg/dL — ABNORMAL HIGH (ref 70–99)
Glucose-Capillary: 87 mg/dL (ref 70–99)

## 2022-08-31 LAB — RENAL FUNCTION PANEL
Albumin: 2 g/dL — ABNORMAL LOW (ref 3.5–5.0)
Anion gap: 12 (ref 5–15)
BUN: 64 mg/dL — ABNORMAL HIGH (ref 8–23)
CO2: 18 mmol/L — ABNORMAL LOW (ref 22–32)
Calcium: 6.4 mg/dL — CL (ref 8.9–10.3)
Chloride: 111 mmol/L (ref 98–111)
Creatinine, Ser: 3.2 mg/dL — ABNORMAL HIGH (ref 0.44–1.00)
GFR, Estimated: 14 mL/min — ABNORMAL LOW (ref >60)
Glucose, Bld: 85 mg/dL (ref 70–99)
Phosphorus: 5.1 mg/dL — ABNORMAL HIGH (ref 2.5–4.6)
Potassium: 3.9 mmol/L (ref 3.5–5.1)
Sodium: 141 mmol/L (ref 135–145)

## 2022-08-31 LAB — HEPATIC FUNCTION PANEL
ALT: 30 U/L (ref 0–44)
AST: 56 U/L — ABNORMAL HIGH (ref 15–41)
Albumin: 2 g/dL — ABNORMAL LOW (ref 3.5–5.0)
Alkaline Phosphatase: 90 U/L (ref 38–126)
Bilirubin, Direct: <0.1 mg/dL (ref 0.0–0.2)
Total Bilirubin: 0.2 mg/dL — ABNORMAL LOW (ref 0.3–1.2)
Total Protein: 5.4 g/dL — ABNORMAL LOW (ref 6.5–8.1)

## 2022-08-31 LAB — ECHOCARDIOGRAM COMPLETE
AR max vel: 0.73 cm2
AV Area VTI: 0.62 cm2
AV Area mean vel: 0.68 cm2
AV Mean grad: 50 mmHg
AV Peak grad: 83.2 mmHg
Ao pk vel: 4.56 m/s
Area-P 1/2: 5.16 cm2
Height: 61 in
S' Lateral: 2.7 cm
Weight: 2980.62 oz

## 2022-08-31 LAB — MAGNESIUM: Magnesium: 2 mg/dL (ref 1.7–2.4)

## 2022-08-31 MED ORDER — BISACODYL 5 MG PO TBEC
5.0000 mg | DELAYED_RELEASE_TABLET | Freq: Once | ORAL | Status: AC
  Administered 2022-08-31: 5 mg via ORAL
  Filled 2022-08-31: qty 1

## 2022-08-31 MED ORDER — FUROSEMIDE 10 MG/ML IJ SOLN
80.0000 mg | Freq: Two times a day (BID) | INTRAMUSCULAR | Status: AC
  Administered 2022-08-31 (×2): 80 mg via INTRAVENOUS
  Filled 2022-08-31 (×2): qty 8

## 2022-08-31 MED ORDER — CALCIUM CARBONATE 1250 (500 CA) MG PO TABS
1000.0000 mg | ORAL_TABLET | Freq: Three times a day (TID) | ORAL | Status: AC
  Administered 2022-08-31 – 2022-09-02 (×6): 2500 mg via ORAL
  Filled 2022-08-31 (×6): qty 2

## 2022-08-31 MED ORDER — SENNOSIDES-DOCUSATE SODIUM 8.6-50 MG PO TABS
1.0000 | ORAL_TABLET | Freq: Once | ORAL | Status: AC
  Administered 2022-08-31: 1 via ORAL
  Filled 2022-08-31: qty 1

## 2022-08-31 MED ORDER — VITAMIN D 25 MCG (1000 UNIT) PO TABS
1000.0000 [IU] | ORAL_TABLET | Freq: Every day | ORAL | Status: DC
  Administered 2022-08-31 – 2022-09-05 (×6): 1000 [IU] via ORAL
  Filled 2022-08-31 (×7): qty 1

## 2022-08-31 NOTE — Progress Notes (Signed)
Echocardiogram 2D Echocardiogram has been performed.  Sherry [name] 08/31/2022, 1:57 PM

## 2022-08-31 NOTE — Progress Notes (Addendum)
Summary: [name] is a 77 y.o. living with HFpEF, severe AS and mild MR, and CKD4 who was directly admitted from [facility] with concern for volume overload, AKI on CKD, and electrolyte disturbances including hypocalcemia. [name] was found to be severely volume overloaded with diffuse edema at [facility] appointment on 7/12.   Subjective:  No overnight events. Daughter [name] and patient's husband at bedside. Patient reported feeling much better this morning. She reports her SOB is improved and she feels like she got fluid off. She was able to sleep with the head of bed elevated. Denies CP, abdominal pain, pain with urination, heart palpitations, headache, N/V, or diarrhea. Also denied confusion, muscle cramps, tremor, or numbness. Reviewed plan for the day with the patient and her family.   Objective:  Vital signs in last 24 hours: Vitals:   08/31/22 0126 08/31/22 0609 08/31/22 0741 08/31/22 1201  BP: 134/85 133/85 117/73 (!) 155/100  Pulse: 83 92    Resp: 18 18    Temp: 98.7 F (37.1 C) 98.4 F (36.9 C) 98.3 F (36.8 C) 98.2 F (36.8 C)  TempSrc: Oral Oral Oral Oral  SpO2:      Weight:  84.5 kg        Latest Ref Rng & Units 08/30/2022    4:35 PM 08/30/2022    3:14 PM 06/14/2022   11:52 AM  CBC  WBC 4.0 - 10.5 K/uL 2.0  2.2  2.6   Hemoglobin 12.0 - 15.0 g/dL 8.2  7.8  9.6   Hematocrit 36.0 - 46.0 % 27.1  26.2  31.5   Platelets 150 - 400 K/uL 268  300  279        Latest Ref Rng & Units 08/31/2022    6:51 AM 08/30/2022    6:26 PM 08/30/2022   11:30 AM  CMP  Glucose 70 - 99 mg/dL 85  99  85   BUN 8 - 23 mg/dL 64  65  65   Creatinine 0.44 - 1.00 mg/dL 1.61  0.96  0.45   Sodium 135 - 145 mmol/L 141  140  139   Potassium 3.5 - 5.1 mmol/L 3.9  3.8  3.6   Chloride 98 - 111 mmol/L 111  111  109   CO2 22 - 32 mmol/L 18  16  18    Calcium 8.9 - 10.3 mg/dL 6.4  6.2  6.1   Total Protein 6.5 - 8.1 g/dL 5.4  6.3  6.7   Total Bilirubin 0.3 - 1.2 mg/dL 0.2  0.5  0.4   Alkaline Phos  38 - 126 U/L 90  103  114   AST 15 - 41 U/L 56  40  41   ALT 0 - 44 U/L 30  24  28      Intake/Output Summary (Last 24 hours) at 08/31/2022 1355 Last data filed at 08/31/2022 0600 Gross per 24 hour  Intake 240 ml  Output 950 ml  Net -710 ml    Echo performed 7/13, results pending  Physical Exam Constitutional: Patient is resting comfortably in bed in no acute distress.  CV: Regular rate and rhythm with AS murmur on auscultation. Improved pitting edema upper and lower extremities. JVD up to mandible.  Pulmonary/Respiratory: Bilateral ronchi present on auscultation, some wheezing. Increased respiratory effort with position change (i.e.sitting up in bed for exam).  Abdominal: Positive bowel sounds, soft and non-tender. Neuro: Alert and oriented to safe, place, and situation; conversing appropriately with intermittent word finding difficulties.  Skin:  Warm and dry. Psych: Normal mood and affect.   Assessment/Plan: Patient is a 77 y.o. with PMH of HTN, Type 1 AV block, Aortic Stenosis, HLD, diastolic CHF, CKD IV, osteoporosis, seizure disorder, and history of CVA who is here for hypocalcemia in the setting of acute HF exacerbation and UTI.   Principal Problem:   Acute diastolic congestive heart failure (HCC) Active Problems:   Essential hypertension   Acute kidney injury superimposed on CKD (HCC)   Severe aortic stenosis   UTI (urinary tract infection)   Hypocalcemia  Hypocalcemia Patient presented to the [facility] on 7/9 with Ca of 5.8. Repeat Ca 7/12 of 6.1/corrected calcium 7.4 with worsening anasarca and SOB with activity. Corrected calcium 7/13 of 8.0 following administration of 2g IV Ca and 1000 mg PO Ca on 7/12. Ionized calcium and PTH in process. Patient denied muscle spasms, confusion, or numbness today. She stated her right upper extremity "tremor" was not a problem today.   Per chart review, patient is not taking Calcium Carb-Cholecalciferol 500-400 mg BID  regularly. She was prescribed calcium carbonate 2,500 mg BID at 7/09 clinic visit but it does not appear this was dispensed. She is getting denosumab injections for osteoporosis every 6 months, but she may not have been getting them since October 2023. Denosumab has a known side effect of reduced calcium especially in patients with known kidney disease. Patient with 5-pack smoking history, CXR from 2021 for comparison, no recent CT scan of lungs.    The differential for her hypocalcemia is quite broad given her medical comorbidities and risk factors. Will continue to work up while repleting calcium. Patient was last seen by Dr. Signe Colt with [facility] on 10/11/21 but note is not visible. Dr. Arlean Hopping (nephrology) attempted to look up note but there is a systems update. Agreed with recommendation to give patient calcium carbonate TID while she gets IV lasix, and to indicate patient has an intolerance to Denosumab. Will f/u if there are additional recommendations by nephrology when ionized calcium and PTH results.   Plan:  - Calcium carbonate tid -Vitamin D 1000 international units  daily -F/u PTH     Acute on chronic HF Severe LVH Conduction disease with 2:1 AV block AS Anemia  Follows with cardiology. Last echocardiogram 11/02/21 with LV EF 60-65%, severe hypertrophy, consistent with Grade 1 diastolic dysfunction, mildly elevated pulmonary artery systolic pressure, LA moderately dilated, RA mildly dilated, Mitral valve with severe mitral annular calcification, Aortic valve tricuspid, and normal IVC. History of 2:1 AV block.   Home dose of diuretics may not be enough to address her chronic swelling, leading to an exacerbation of her diastolic HF. Patient denies CP and VS remain stable. Less concern for ACS and PE. Urine output 950 ccs with external urinary catheter. Pitting edema on upper and lower extremities improving, JVP still present up to the mandible.   Plan:  - IV lasix 80 mg  BID - Telemetry  - Daily weights - Monitor I/Os - Renal diet + feeding supplement  - F/u echo performed 7/13 - Will continue to hold amlodipine, which can contribute to swelling   CKD IV AKI 2/2 UTI Patient with CKD stage IV, Cr of 3.19 on 7/09 with dysuria and UA positive for leukocytes and cloudy urine. AKI could also be due to acute heart failure or chronic high doses of diuretics. Prescribed cephalexin 500 mg BID starting on 7/09 but may not have taken all of her medications regularly on day  of admission. Cr today of 3.20. Repeat UA 7/12 showing moderate leukocytes, proteinuria, no bacteria, and clear urine. Patient denied pain with urination.   Will follow-up with Dr. Arlean Hopping (nephrology here) if he can view the 10/11/21 note by Dr. Signe Colt of [facility].  Plan:  - Monitor Cr - Cephalexin 500 mg BID to complete a 5 day course (ends 7/14 evening)   - Renal diet + feeding supplement  - Consult nephrology for electrolyte abnormalities and nutrition   Elevated TSH Elevated TSH 6.844 (April TSH 4.009), Free T4 0.81 WNL; follow up in outpatient.    Chronic health conditions Seizure disorder- continue phenobarbital 129.6 mg, zonisamide/zonegran 200 mg daily; normal phenobarbital levels Chronic pain/osteoarthritis- Tylenol 650 q6h PRN  Constipation- polyethylene glycol 17g daily PRN + Senokot once 7/13; will address hemorrhoids if there is acute bleeding Asthma- albuterol PRN  CVA- aspirin 81 daily  GERD pantoprazole 40 mg CAD- rosuvastatin 40 mg Osteoporosis- denosumab injection every 6 months  Prior to Admission Living Arrangement: Home with spouse Anticipated Discharge Location: Home Barriers to Discharge: Medical management.  Dispo: Anticipated discharge in approximately 1-2 day(s).   Philomena Doheny, MD, PGY-1 08/31/2022, 1:45 PM Pager: @MYPAGER @ After 5pm on weekdays and 1pm on weekends: On Call pager [phone]

## 2022-08-31 NOTE — Plan of Care (Signed)

## 2022-09-01 ENCOUNTER — Inpatient Hospital Stay (HOSPITAL_COMMUNITY): Payer: 59

## 2022-09-01 LAB — GLUCOSE, CAPILLARY
Glucose-Capillary: 75 mg/dL (ref 70–99)
Glucose-Capillary: 106 mg/dL — ABNORMAL HIGH (ref 70–99)
Glucose-Capillary: 66 mg/dL — ABNORMAL LOW (ref 70–99)

## 2022-09-01 LAB — CALCIUM, IONIZED
Calcium, Ionized, Serum: 3.7 mg/dL — ABNORMAL LOW (ref 4.5–5.6)
Calcium, Ionized, Serum: 3.9 mg/dL — ABNORMAL LOW (ref 4.5–5.6)

## 2022-09-01 LAB — CBC
HCT: 23.3 % — ABNORMAL LOW (ref 36.0–46.0)
Hemoglobin: 7.1 g/dL — ABNORMAL LOW (ref 12.0–15.0)
MCH: 29.1 pg (ref 26.0–34.0)
MCHC: 30.5 g/dL (ref 30.0–36.0)
MCV: 95.5 fL (ref 80.0–100.0)
Platelets: 283 10*3/uL (ref 150–400)
RBC: 2.44 MIL/uL — ABNORMAL LOW (ref 3.87–5.11)
RDW: 15.5 % (ref 11.5–15.5)
WBC: 2.6 10*3/uL — ABNORMAL LOW (ref 4.0–10.5)
nRBC: 0 % (ref 0.0–0.2)

## 2022-09-01 LAB — RENAL FUNCTION PANEL
Albumin: 2 g/dL — ABNORMAL LOW (ref 3.5–5.0)
Anion gap: 9 (ref 5–15)
BUN: 66 mg/dL — ABNORMAL HIGH (ref 8–23)
CO2: 20 mmol/L — ABNORMAL LOW (ref 22–32)
Calcium: 6.4 mg/dL — CL (ref 8.9–10.3)
Chloride: 111 mmol/L (ref 98–111)
Creatinine, Ser: 3.32 mg/dL — ABNORMAL HIGH (ref 0.44–1.00)
GFR, Estimated: 14 mL/min — ABNORMAL LOW (ref >60)
Glucose, Bld: 94 mg/dL (ref 70–99)
Phosphorus: 4.8 mg/dL — ABNORMAL HIGH (ref 2.5–4.6)
Potassium: 3.9 mmol/L (ref 3.5–5.1)
Sodium: 140 mmol/L (ref 135–145)

## 2022-09-01 MED ORDER — SENNOSIDES-DOCUSATE SODIUM 8.6-50 MG PO TABS
2.0000 | ORAL_TABLET | Freq: Two times a day (BID) | ORAL | Status: DC
  Administered 2022-09-01 – 2022-09-08 (×9): 2 via ORAL
  Filled 2022-09-01 (×13): qty 2

## 2022-09-01 MED ORDER — SORBITOL 70 % SOLN
15.0000 mL | Freq: Once | Status: AC
  Administered 2022-09-01: 15 mL via ORAL
  Filled 2022-09-01: qty 30

## 2022-09-01 MED ORDER — POLYETHYLENE GLYCOL 3350 17 G PO PACK
17.0000 g | PACK | Freq: Every day | ORAL | Status: DC
  Administered 2022-09-05 – 2022-09-07 (×2): 17 g via ORAL
  Filled 2022-09-01 (×3): qty 1

## 2022-09-01 MED ORDER — FUROSEMIDE 10 MG/ML IJ SOLN
80.0000 mg | Freq: Two times a day (BID) | INTRAMUSCULAR | Status: AC
  Administered 2022-09-01 (×2): 80 mg via INTRAVENOUS
  Filled 2022-09-01 (×2): qty 8

## 2022-09-01 MED ORDER — ALBUTEROL SULFATE (2.5 MG/3ML) 0.083% IN NEBU
2.5000 mg | INHALATION_SOLUTION | Freq: Two times a day (BID) | RESPIRATORY_TRACT | Status: DC
  Administered 2022-09-02: 2.5 mg via RESPIRATORY_TRACT
  Filled 2022-09-01 (×2): qty 3

## 2022-09-01 MED ORDER — ALBUTEROL SULFATE (2.5 MG/3ML) 0.083% IN NEBU
2.5000 mg | INHALATION_SOLUTION | RESPIRATORY_TRACT | Status: DC
  Administered 2022-09-01 (×4): 2.5 mg via RESPIRATORY_TRACT
  Filled 2022-09-01 (×4): qty 3

## 2022-09-01 NOTE — Consult Note (Signed)
WOC Nurse Consult Note: Reason for Consult:nearly reepithelialized (healed) stage 3 pressure injury to sacrum. Erythema intertrigo. Resolving perineal irritant contact dermatitis due to urinary incontinence. Wound type:Pressure, moisture  ICD-10 CM Codes for Irritant Dermatitis  L24A2 - Due to fecal, urinary or dual incontinence L24A9 - Due to friction or contact with other specified body fluids L30.4  - Erythema intertrigo. Also used for abrasion of the hand, chafing of the skin, dermatitis due to sweating and friction, friction dermatitis, friction eczema, and genital/thigh intertrigo.   Pressure Injury POA: Yes Measurement:total area measures 6cm x 8.8cm with central area of nearly reepithelialized stage 3 pressure injury measuring 3.5cm x 0.4cm x 0.2cm Wound bed: See photo uploaded to EMR by Provider today, Pink, moist with mild maceration at the periphery. Drainage (amount, consistency, odor) scant serous to none Periwound:As described above, mild maceration Dressing procedure/placement/frequency: I have provided Nursing with guidance in the care of this lesion using a size appropriate piece of antimicrobial nonadherent (xeroform) gauze applied to the reepithelializing wound and covering with a silicone foam and changed every other day and PRN soiling. House antimicrobial moisture wicking textile (InterDry) is to be used in the intertriginous areas of dermatitis, Turning and repositioning is in place. Heels are floated using Prevalon Boots. A pressure redistribution chair pad is to be used in house when OOB in chair and sent with patient at discharge.  WOC nursing team will not follow, but will remain available to this patient, the nursing and medical teams.  Please re-consult if needed.  Thank you for inviting [place] to participate in this patient's Plan of Care.  Ladona Mow, MSN, RN, CNS, GNP, Leda Min, [employer], [employer] phone:  [phone]

## 2022-09-01 NOTE — Progress Notes (Signed)
Summary: Sherry [name] is a 77 y.o. living with HFpEF, severe AS and mild MR, and CKD4 who was directly admitted from [facility] with concern for volume overload, AKI on CKD, and hypocalcemia currently being treated with IV diuresis and scheduled calcium carbonate  Subjective:  No overnight event.  Patient with increased work of breathing today reporting cough but not short of breath.  Denied chest pain, palpitations, changes in vision.  Still unable to pass bowel movement.  Denies nausea and vomiting.  Patient still able to sleep with reclined in bed.  Denied bleeding per rectum or sacral pain.  Denied dysuria today.  Feels less swollen but main concern today is her cough.  Not able to produce any sputum.   Objective:  Vital signs in last 24 hours: Vitals:   09/01/22 0426 09/01/22 0730 09/01/22 0800 09/01/22 0912  BP:  (!) 143/84 (!) 146/80   Pulse:  79 94 89  Resp:  15  (!) 22  Temp: 98.4 F (36.9 C) 97.8 F (36.6 C)    TempSrc: Oral Oral    SpO2:  100%  97%  Weight:          Latest Ref Rng & Units 09/01/2022   12:29 AM 08/30/2022    4:35 PM 08/30/2022    3:14 PM  CBC  WBC 4.0 - 10.5 K/uL 2.6  2.0  2.2   Hemoglobin 12.0 - 15.0 g/dL 7.1  8.2  7.8   Hematocrit 36.0 - 46.0 % 23.3  27.1  26.2   Platelets 150 - 400 K/uL 283  268  300        Latest Ref Rng & Units 09/01/2022   12:29 AM 08/31/2022    6:51 AM 08/30/2022    6:26 PM  CMP  Glucose 70 - 99 mg/dL 94  85  99   BUN 8 - 23 mg/dL 66  64  65   Creatinine 0.44 - 1.00 mg/dL 1.61  0.96  0.45   Sodium 135 - 145 mmol/L 140  141  140   Potassium 3.5 - 5.1 mmol/L 3.9  3.9  3.8   Chloride 98 - 111 mmol/L 111  111  111   CO2 22 - 32 mmol/L 20  18  16    Calcium 8.9 - 10.3 mg/dL 6.4  6.4  6.2   Total Protein 6.5 - 8.1 g/dL  5.4  6.3   Total Bilirubin 0.3 - 1.2 mg/dL  0.2  0.5   Alkaline Phos 38 - 126 U/L  90  103   AST 15 - 41 U/L  56  40   ALT 0 - 44 U/L  30  24     Intake/Output Summary (Last 24 hours) at 09/01/2022  1115 Last data filed at 09/01/2022 1015 Gross per 24 hour  Intake --  Output 2800 ml  Net -2800 ml    Echo performed 7/13, results pending  Physical Exam Elderly woman in mild distress Regular rate and rhythm, systolic murmur best heard heard on second right upper sternal border consistent with AS Speaking in full sentences room air with good oxygen saturations.  Improving respiratory effort however cough and increased wheezing in.  Bilateral lung auscultation . BS present, non tender abdomen Alert and oriented patient. Having difficulties with some work recall otherwise able to communicate why she is here in the hospital and course of disease Skin: Warm and dry.  Psych: Normal mood and affect.   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  Normal RV function. There is a small to moderate pericardial effusion without mention of tamponade physiology. These findings are unchanged from prior. Patient follows with cardiology outpatient. Etiology of acute HF likely in the setting of inadequate diuretics dose. 2L urine output now with 80 mg IV lasix BID. Patient is still volume overloaded. No overt electrolyte derangements today.  Mild increase in work of breathing with cough.  Evaluated with chest x-ray with pulmonary edema and pleural effusion.  Suspect still the case that this patient will need further IV diuresis.  Will continue with BID lasix dosing; if no  improvement by tomorrow, will increase to TID dosing. Plan:  - IV lasix 80 mg BID - Telemetry  - Daily weights - Monitor I/Os + weights daily - Renal diet + feeding supplement  -Incentive spirometer and every 4 bronchodilator nebulized treatments -Consult to PT to evaluate and treat -Monitor electrolytes daily    CKD IV Current presentation concerning for progression of CKD vs congestion in setting of acute HF vs AKI in setting of intravascular depletion 2/2 anasarca vs UTI (see below). No improvement in Cr or GFR with one day of BID 80 mg lasix. Nephrology consulted; currently reviewing prior notes from Dr. Signe Colt from [facility]. May need to follow outpatient.  - Monitor RFP - Avoid nephrotoxic medications -Renal diet and fluid restriction - Nephrology following, appreciate recommendations  UTI Diagnosed outpatient with improvement after starting Keflex therapy on 7/9. Patient is to complete 5 days of UTI therapy on 7/14. Plan:  - Cephalexin 500 mg BID to complete a 5 day course (ends 7/14 evening)    Acute on chronic normocytic anemia Hgb today 7.1 from 8.2. Chronic anemia likely in setting of chronic disease/stage IV CKD. Patient has history of hemorrhoids and mentioned straining yesterday. No overt signs of bleeding overnight. Last colonoscopy in 2008. Given history of severe AS, question possible AVMs. Will continue to monitor at this time. -Monitor CBC -Transfuse for Hgb less than 7  Hypocalcemia Mild vitamin D deficienty Currently awaiting results from work up sent from admission, including intact PTH. Patient's corrected Ca remains at 8 today. Now on TID Ca carbonate supplementation as she continues to be diuresed today and daily vitamin D. Suspect her hypocalcemia is setting of CKD and Denosumab therapy. Discussed with Dr. Arlean Hopping on 7/14. No overt telemetry changes. Patient remains asymptomatic without major changes on telemetry. Plan:  - Calcium carbonate tid -Vitamin D 1000  international units  daily -F/u PTH  Pressure wounds Pictures above and on media tab.  -Wound care consulted  Nonbleeding external hemorrhoids with mucosal prolapse Constipation Patient denied pain today -One-time dose of sorbitol today -Scheduled MiraLAX daily and senna S2 tabs twice daily -Monitor for signs of bleeding and discomfort  Subclinical hypothyroidism Elevated TSH 6.844 (April TSH 4.009), Free T4 0.81, likely in the setting of acute illness -Outpatient follow up  Chronic health conditions Seizure disorder- continue phenobarbital 129.6 mg, zonisamide/zonegran 200 mg daily; normal phenobarbital levels Chronic pain/osteoarthritis- Tylenol 650 q6h PRN  Constipation- polyethylene glycol 17g daily PRN + Senokot once 7/13; will address hemorrhoids if there is acute bleeding Asthma- albuterol PRN  CVA- aspirin 81 daily  GERD pantoprazole 40 mg CAD- rosuvastatin 40 mg Osteoporosis- denosumab injection every 6 months  Prior to Admission Living Arrangement: Home with spouse Anticipated Discharge Location: Home Barriers to Discharge: Medical management.  Dispo: Anticipated discharge in approximately 1-2 day(s).   Morene Crocker, MD, PGY-2 09/01/2022, 11:15 AM  After 5pm on weekdays and 1pm on weekends: On Call pager [phone]

## 2022-09-02 DIAGNOSIS — Z87891 Personal history of nicotine dependence: Secondary | ICD-10-CM

## 2022-09-02 DIAGNOSIS — J90 Pleural effusion, not elsewhere classified: Secondary | ICD-10-CM | POA: Diagnosis not present

## 2022-09-02 DIAGNOSIS — I5033 Acute on chronic diastolic (congestive) heart failure: Secondary | ICD-10-CM

## 2022-09-02 DIAGNOSIS — N184 Chronic kidney disease, stage 4 (severe): Secondary | ICD-10-CM | POA: Diagnosis not present

## 2022-09-02 DIAGNOSIS — N179 Acute kidney failure, unspecified: Secondary | ICD-10-CM | POA: Diagnosis not present

## 2022-09-02 LAB — RENAL FUNCTION PANEL
Albumin: 2 g/dL — ABNORMAL LOW (ref 3.5–5.0)
Albumin: 2 g/dL — ABNORMAL LOW (ref 3.5–5.0)
Anion gap: 8 (ref 5–15)
Anion gap: 14 (ref 5–15)
BUN: 70 mg/dL — ABNORMAL HIGH (ref 8–23)
BUN: 72 mg/dL — ABNORMAL HIGH (ref 8–23)
CO2: 21 mmol/L — ABNORMAL LOW (ref 22–32)
CO2: 18 mmol/L — ABNORMAL LOW (ref 22–32)
Calcium: 6.3 mg/dL — CL (ref 8.9–10.3)
Calcium: 6.5 mg/dL — ABNORMAL LOW (ref 8.9–10.3)
Chloride: 110 mmol/L (ref 98–111)
Chloride: 107 mmol/L (ref 98–111)
Creatinine, Ser: 3.58 mg/dL — ABNORMAL HIGH (ref 0.44–1.00)
Creatinine, Ser: 3.34 mg/dL — ABNORMAL HIGH (ref 0.44–1.00)
GFR, Estimated: 13 mL/min — ABNORMAL LOW (ref >60)
GFR, Estimated: 14 mL/min — ABNORMAL LOW (ref >60)
Glucose, Bld: 98 mg/dL (ref 70–99)
Glucose, Bld: 130 mg/dL — ABNORMAL HIGH (ref 70–99)
Phosphorus: 4.9 mg/dL — ABNORMAL HIGH (ref 2.5–4.6)
Phosphorus: 4.9 mg/dL — ABNORMAL HIGH (ref 2.5–4.6)
Potassium: 4 mmol/L (ref 3.5–5.1)
Potassium: 3.9 mmol/L (ref 3.5–5.1)
Sodium: 139 mmol/L (ref 135–145)
Sodium: 139 mmol/L (ref 135–145)

## 2022-09-02 LAB — CBC
HCT: 24.3 % — ABNORMAL LOW (ref 36.0–46.0)
Hemoglobin: 7.3 g/dL — ABNORMAL LOW (ref 12.0–15.0)
MCH: 28.7 pg (ref 26.0–34.0)
MCHC: 30 g/dL (ref 30.0–36.0)
MCV: 95.7 fL (ref 80.0–100.0)
Platelets: 287 10*3/uL (ref 150–400)
RBC: 2.54 MIL/uL — ABNORMAL LOW (ref 3.87–5.11)
RDW: 15.5 % (ref 11.5–15.5)
WBC: 2.6 10*3/uL — ABNORMAL LOW (ref 4.0–10.5)
nRBC: 0 % (ref 0.0–0.2)

## 2022-09-02 LAB — GLUCOSE, CAPILLARY: Glucose-Capillary: 82 mg/dL (ref 70–99)

## 2022-09-02 LAB — MAGNESIUM: Magnesium: 2 mg/dL (ref 1.7–2.4)

## 2022-09-02 MED ORDER — FUROSEMIDE 10 MG/ML IJ SOLN
80.0000 mg | Freq: Three times a day (TID) | INTRAMUSCULAR | Status: AC
  Administered 2022-09-02 (×3): 80 mg via INTRAVENOUS
  Filled 2022-09-02 (×3): qty 8

## 2022-09-02 MED ORDER — FUROSEMIDE 10 MG/ML IJ SOLN: 80.0000 mg | Freq: Two times a day (BID) | INTRAMUSCULAR | Status: DC

## 2022-09-02 NOTE — Hospital Course (Addendum)
Patient Summary: Sherry [name] is a 77 y.o. with a pertinent PMH of HFpEF (60-65% EF), severe AS, mild MR, and CKD 4 who presented for volume overload and directly admitted from [facility] on 7/12 for acute heart failure, AKI on CKD, UTI, and electrolyte disturbances including hypocalcemia.   Acute on Chronic Diastolic Heart Failure Severe AS, non rheumatic  Severe LVH, ?Cardiac amyloid History of 2:1 AV block  Follows with cardiology. Last echocardiogram 11/02/21 with LV EF 60-65%, severe hypertrophy, consistent with Grade 1 diastolic dysfunction, mildly elevated pulmonary artery systolic pressure, LA moderately dilated, RA mildly dilated, Mitral valve with severe mitral annular calcification, Aortic valve tricuspid, and normal IVC. History of 2:1 AV block.    Home dose of diuretics may not be enough to address her chronic swelling, leading to an exacerbation of her diastolic HF. Patient denies CP and VS remain stable. Less concern for ACS and PE. Urine output 950 ccs with external urinary catheter. Pitting edema on upper and lower extremities improving, JVP still present up to the mandible.   Repeat TTE this admission with LVEF of 60-65% with normal LV function, similar to 10/2021 echo. Aortic valve area 0.62, mean gradient of 50 up 29.8 from September 2023 echo. Per chart review, TAVR not an option due to comorbidities. Patient follows with cardiology outpatient (Dr. Excell Seltzer). Consulted cardiology 7/15, started following patient 7/16. Dr. Shirlee Latch with cardiology counseled patient and husband on risks associated with TAVR including renal failure and dialysis, heart block and pacemaker, and possible inability to tolerate these procedures. Etiology of acute HF likely due to inadequate oral diuretic dose, progressive CKD and worsening aortic stenosis.    Patient is still volume overloaded. Will consider nephrology consult if output decreases, or kidney function worsens.    Cardiology continuing IV lasix drip  then likely transitioning to oral lasix tomorrow. Not repeating metolazone (received doses on 7/17, 7/18). Bed weight change -11.7 kg. Cr. 3.27. Gave IV Mg 2g 7/18.    Plan:  - Continue IV lasix drip 12 mg/hr - Restart Jardiance 10 mg daily - Telemetry  - Daily weights - Monitor I/Os + weights daily - Renal diet + feeding supplement, following RD recommendations - Incentive spirometer and 4 bronchodilator nebulized treatments - Monitor electrolytes daily    AKI on CKD IV Patient with CKD stage IV, Cr of 3.19 on 7/09 with dysuria and UA positive for leukocytes and cloudy urine. AKI could also be due to acute heart failure or chronic high doses of diuretics. Prescribed cephalexin 500 mg BID starting on 7/09 but may not have taken all of her medications regularly on day of admission. Cr today of 3.20. Repeat UA 7/12 showing moderate leukocytes, proteinuria, no bacteria, and clear urine. Patient denied pain with urination.    Will follow-up with Dr. Arlean Hopping (nephrology here) if he can view the 10/11/21 note by Dr. Signe Colt of [facility].  Current presentation concerning for progression of CKD vs congestion in setting of acute HF vs AKI in setting of intravascular depletion 2/2 anasarca vs UTI (see below). No improvement in Cr or GFR on BID 80 mg lasix. Nephrology reviewing prior notes from Dr. Signe Colt from [facility]. Will consider nephrology consult following cardiology consult.    Plan:  - Monitor RFP - Avoid nephrotoxic medications - Renal diet and fluid restriction - F/u on RD recommendations   Hypocalcemia Secondary Hyperparathyroidism  Mild vitamin D deficiency Patient presented to the [facility] on 7/9 with Ca of 5.8. Repeat  Ca 7/12 of 6.1/corrected calcium 7.4 with worsening anasarca and SOB with activity. Corrected calcium 7/13 of 8.0 following administration of 2g IV Ca and 1000 mg PO Ca on 7/12. Ionized calcium and PTH in process. Patient denied muscle spasms,  confusion, or numbness today. She stated her right upper extremity "tremor" is intermittent.    Per chart review, patient is not taking Calcium Carb-Cholecalciferol 500-400 mg BID regularly. She was prescribed calcium carbonate 2,500 mg BID at 7/09 clinic visit but it does not appear this was dispensed. She was getting denosumab injections for osteoporosis every 6 months, but she may not have been getting them since October 2023. Denosumab has a known side effect of reduced calcium especially in patients with known kidney disease. Patient with 5-pack smoking history, CXR from 2021 for comparison, no recent CT scan of lungs.    PTH 185 and persistently low Ca in line with secondary hyperparathyroidism associated with her CKD. Patient remained asymptomatic without major changes on telemetry. Received calcium carbonate TID and Vitamin D 1000 international units daily until 7/18 with minimal improvement to her calcium levels.  Patient started on a calcium tablet 1250 TID, changed Vitamin D to every other day x2 doses then weekly, and completed some rounds of IV calcium 1-2g. ***   Small to Moderate Pericardial Effusion Mentioned on repeat echo; suspect this is due to volume overload in the setting of acute on chronic CHF and advanced CKD V.   Acute on chronic normocytic anemia Hgb today 7.7 from 7.4 yesterday. Chronic anemia likely in setting of chronic disease/stage IV CKD. Patient has history of hemorrhoids and experiences intermittent straining. No overt signs of bleeding overnight. Last colonoscopy in 2008. Given history of severe AS, question possible AVMs. Will continue to monitor at this time. -Monitor CBC -Transfuse for Hgb less than 7    Pressure wounds Pictures above and on media tab. Wound care consulted, made recommendations (refer to note), will not be following patient. Wound last cleaned 7/18 per nursing. Patient's pain did not improve too much with lidocaine cream. Ordered bariatric bed  to help offload the pressure. Some improvement in lower back pain and discomfort with lidocaine 4% cream PRN and muscle rub CREA topical PRN for muscle pain. Updated image on media tab ***   Nonbleeding external hemorrhoids with mucosal prolapse Constipation Monitored for signs of bleeding and discomfort, no associated pain or acute bleeds throughout hospitalization. Received a one-time dose of sorbitol on 7/14. Scheduled MiraLAX daily and senna S2 tabs twice daily.    UTI, resolved Diagnosed outpatient with improvement after starting Keflex therapy on 7/9. Patient completed 5 days of UTI therapy with Cephalexin 500 mg BID on 7/14. Patient denied dysuria on day of discharge. ***   Subclinical hypothyroidism Elevated TSH 6.844 (April TSH 4.009), Free T4 0.81, likely in the setting of acute illness. Recommend follow-up in the outpatient setting.    Chronic health conditions Seizure disorder- continue phenobarbital 129.6 mg, zonisamide/zonegran 200 mg daily; normal phenobarbital levels Chronic pain/osteoarthritis- Tylenol 650 q6h PRN  Asthma- albuterol PRN  CVA- aspirin 81 daily  GERD pantoprazole 40 mg CAD- rosuvastatin 40 mg Osteoporosis- denosumab injection every 6 months DISCONTINUE, patient intolerance    7/22 Patient reports that she hasn't had a problem breathing in a couple days. Denies, chest pain, cough, abd pain, nausea, vomiting, and SOB.

## 2022-09-02 NOTE — Progress Notes (Addendum)
HD#2 SUBJECTIVE:  Patient Summary: Sherry [name] is a 77 y.o. with a pertinent PMH of HFpEF (60-65% EF), severe AS, mild MR, and CKD 4 who presented for volume overload and directly admitted from [facility] on 7/12 for acute heart failure, AKI on CKD, UTI, and electrolyte disturbances including hypocalcemia.   Overnight Events:  No overnight events. Husband at bedside. Reports she is feeling well this morning. Slept well last night. Denies chest pain. Reports breathing is overall okay with a little cough this morning; reports she spit up a little something one time but cough is largely nonproductive. No N/V/abdominal pain. Denies BM this morning or pain with urination. Did have BM yesterday, no blood in stool. Reports back pain from slipped disc, denied pain from sacral wound.   Interim History:   OBJECTIVE:  Vital Signs: Vitals:   09/02/22 0355 09/02/22 0730 09/02/22 0820 09/02/22 1150  BP: (!) 140/80 (!) 150/84  (!) 149/89  Pulse: 75 79  82  Resp: 14 19  19   Temp: 98.1 F (36.7 C) 97.8 F (36.6 C)  97.6 F (36.4 C)  TempSrc: Oral Oral  Oral  SpO2: 99% 100% 100% 100%  Weight: 84.6 kg      Supplemental O2: Room Air SpO2: 100 % O2 Flow Rate (L/min): 0 L/min FiO2 (%): (!) 0 %  Filed Weights   08/30/22 1630 08/31/22 0609 09/02/22 0355  Weight: 83 kg 84.5 kg 84.6 kg    Intake/Output Summary (Last 24 hours) at 09/02/2022 1336 Last data filed at 09/02/2022 1020 Gross per 24 hour  Intake 420 ml  Output 1150 ml  Net -730 ml   Net IO Since Admission: -4,240 mL [09/02/22 1336]     Latest Ref Rng & Units 09/02/2022   12:46 AM 09/01/2022   12:29 AM 08/30/2022    4:35 PM  CBC  WBC 4.0 - 10.5 K/uL 2.6  2.6  2.0   Hemoglobin 12.0 - 15.0 g/dL 7.3  7.1  8.2   Hematocrit 36.0 - 46.0 % 24.3  23.3  27.1   Platelets 150 - 400 K/uL 287  283  268        Latest Ref Rng & Units 09/02/2022   12:46 AM 09/01/2022   12:29 AM 08/31/2022    6:51 AM  CMP  Glucose 70 - 99 mg/dL 98  94  85    BUN 8 - 23 mg/dL 70  66  64   Creatinine 0.44 - 1.00 mg/dL 1.61  0.96  0.45   Sodium 135 - 145 mmol/L 139  140  141   Potassium 3.5 - 5.1 mmol/L 4.0  3.9  3.9   Chloride 98 - 111 mmol/L 110  111  111   CO2 22 - 32 mmol/L 21  20  18    Calcium 8.9 - 10.3 mg/dL 6.3  6.4  6.4   Total Protein 6.5 - 8.1 g/dL   5.4   Total Bilirubin 0.3 - 1.2 mg/dL   0.2   Alkaline Phos 38 - 126 U/L   90   AST 15 - 41 U/L   56   ALT 0 - 44 U/L   30   Echo performed 7/13:  Left ventricular ejection fraction, by estimation, is 60 to 65%. The left ventricle has normal function. The left ventricle has no regional wall motion abnormalities. There is   severe concentric left ventricular hypertrophy. Small pericardial effusion and large pleural effusion in the left lateral region. Similar ejection fraction on  echo from 11/02/21.  Physical Exam: Physical Exam  Constitutional: Patient is resting comfortably in bed, speaking in full sentences.  CV: Regular rate and rhythm with AS murmur on auscultation. Improved pitting edema upper and lower extremities. BLE edema up to knees.  Pulmonary/Respiratory: Bilateral ronchi present on auscultation, some wheezing. In no acute distress.  Abdominal: Positive bowel sounds, soft and non-tender. Neuro: Alert and oriented; able to answer and ask questions appropriately.  MSK: Right hand/arm tremor stable from admission, present at rest, improves with movement.  Skin: Warm and dry. Psych: Normal mood and affect.   Patient Lines/Drains/Airways Status     Active Line/Drains/Airways     Name Placement date Placement time Site Days   Peripheral IV 08/30/22 22 G 1.75" Right Antecubital 08/30/22  1517  Antecubital  3   Urethral Catheter Monica Sixtos RN Straight-tip 14 Fr. 09/02/22  1029  Straight-tip  less than 1   Pressure Injury 08/30/22 Sacrum Mid 08/30/22  1430  -- 3   Wound / Incision (Open or Dehisced) 08/30/22 Incision - Open;Skin tear Pelvis Anterior;Right 08/30/22  1430   Pelvis  3            ASSESSMENT/PLAN:  Assessment: Principal Problem:   Acute diastolic congestive heart failure (HCC) Active Problems:   Essential hypertension   Acute kidney injury superimposed on CKD (HCC)   Severe aortic stenosis   UTI (urinary tract infection)   Hypocalcemia  Acute on Chronic Diastolic Heart Failure Exacerbation Severe AS, non rheumatic  Severe LVH, ?Cardiac amyloid History of 2:1 AV block  Repeat TTE this admission with LVEF of 60-65% with normal LV function, similar to 10/2021 echo. Patient follows with cardiology outpatient. Etiology of acute HF likely in the setting of inadequate diuretics dose. Cough stable. Na, K, and Cl WNL. Decreased urine output on IV lasix 80 mg BID. Patient is still volume overloaded. Will increase diuresis regimen to TID and foley catheter placed today 7/15 for better monitoring of I/Os. Will consider nephrology consult if output remains limited tomorrow.   Plan:  - IV lasix 80 mg TID - increased from BID - Insert foley catheter given incontinence episodes for strict I&Os given advanced CKD - Telemetry  - Daily weights - Monitor I/Os + weights daily - Renal diet + feeding supplement  - Incentive spirometer and every 4 bronchodilator nebulized treatments - PT to evaluate and treat as needed  - Monitor electrolytes daily    AKI on CKD IV Current presentation concerning for progression of CKD vs congestion in setting of acute HF vs AKI in setting of intravascular depletion 2/2 anasarca vs UTI (see below). No improvement in Cr or GFR on BID 80 mg lasix. Nephrology reviewing prior notes from Dr. Signe Colt from [facility]. Will consider nephrology consult if output remains limited tomorrow.  Plan:  - Monitor RFP - Avoid nephrotoxic medications -Renal diet and fluid restriction - Nephrology following, appreciate recommendations  Small to Moderate Pericardial Effusion Large Left  Mentioned on repeat echo; suspect this is due to volume  overload in the setting of acute on chronic CHF and advanced CKD V.  UTI, resolved Diagnosed outpatient with improvement after starting Keflex therapy on 7/9. Patient completed 5 days of UTI therapy with Cephalexin 500 mg BID on 7/14. Patient denied dysuria today.  Plan:  - Monitor   Acute on chronic normocytic anemia Hgb today 7.3 from 7.1 yesterday. Chronic anemia likely in setting of chronic disease/stage IV CKD. Patient has history of hemorrhoids and experiences intermittent  straining. No overt signs of bleeding overnight. Last colonoscopy in 2008. Given history of severe AS, question possible AVMs. Will continue to monitor at this time. -Monitor CBC -Transfuse for Hgb less than 7   Hypocalcemia Mild vitamin D deficienty Currently awaiting results from work up sent from admission, including intact PTH. Patient's corrected Ca 7.9 today. Now on TID Ca carbonate supplementation as she continues to be diuresed today and daily vitamin D. Suspect her hypocalcemia is setting of CKD and Denosumab therapy. Discussed with Dr. Arlean Hopping on 7/14. No overt telemetry changes. Patient remains asymptomatic without major changes on telemetry. Received calcium carbonate TID 7/13- 7/15.  Plan:  - Vitamin D 1000 international units  daily - F/u PTH - still in process as of 7/15   Pressure wounds Pictures above and on media tab.  -Wound care consulted, made recommendations (refer to note), will not be following patient    Nonbleeding external hemorrhoids with mucosal prolapse Constipation Patient denied pain today. Received a one-time dose of sorbitol on 7/14. Plan: -Scheduled MiraLAX daily and senna S2 tabs twice daily -Monitor for signs of bleeding and discomfort   Subclinical hypothyroidism Elevated TSH 6.844 (April TSH 4.009), Free T4 0.81, likely in the setting of acute illness -Outpatient follow up  Chronic health conditions Seizure disorder- continue phenobarbital 129.6 mg, zonisamide/zonegran  200 mg daily; normal phenobarbital levels Chronic pain/osteoarthritis- Tylenol 650 q6h PRN  Asthma- albuterol PRN  CVA- aspirin 81 daily  GERD pantoprazole 40 mg CAD- rosuvastatin 40 mg Osteoporosis- denosumab injection every 6 months DISCONTINUE, patient intolerance  Best Practice: Diet: Renal diet VTE: enoxaparin (LOVENOX) injection 30 mg Start: 08/30/22 1515 Code: FULL  Prior to Admission Living Arrangement: Home with spouse Anticipated Discharge Location: Pending PT evaluation Barriers to Discharge: Medical management.  Dispo: Anticipated discharge in approximately 1-2 day(s).   Signature: Leontine Radman Colbert Coyer, MD Internal Medicine Resident, PGY-1 [facility]  Pager: @MYPAGER @ 1:36 PM, 09/02/2022   Please contact the on call pager after 5 pm and on weekends at [phone].

## 2022-09-02 NOTE — Progress Notes (Signed)
Heart Failure Navigator Progress Note  Assessed for Heart & Vascular TOC clinic readiness.  Patient does not meet criteria due to Advanced Heart Failure Team consulted. .   Navigator will sign off at this time.   Dawn Fields, BSN, RN Heart Failure Nurse Navigator Secure Chat Only   

## 2022-09-02 NOTE — Evaluation (Signed)
Physical Therapy Evaluation Patient Details Name: Sherry [name] MRN: [id number] DOB: 07-31-1945 Today's Date: 09/02/2022  History of Present Illness  77 yo female [facility] to [facility] on 7[facility]12 as dierct admit from internal medicine center with concern for acute HF exacerbation, UTI, AKI, electrolyte imbalance. PMH includes HTN, type I AV block, Aortic Stenosis, HLD,  diastolic CHF, CKD IV, osteoporosis, seizure disorder, and history of CVA.  Clinical Impression   Pt presents with debility, impaired seated balance, impaired skin integrity, and decreased activity tolerance. Pt to benefit from acute PT to address deficits. Pt requiring mod-max assist for to/from EOB, per pt's husband she requires significant assist at home for mobility which he can provide but she is weaker vs baseline. Pt may benefit from short-term inpatient rehabilitation, but pt and husband express they would rather pt d/c home. PT to progress mobility as tolerated, and will continue to follow acutely.          Assistance Recommended at Discharge Frequent or constant Supervision/Assistance  If plan is discharge home, recommend the following:  Can travel by private vehicle  Two people to help with walking and/or transfers;Two people to help with bathing/dressing/bathroom        Equipment Recommendations Other (comment) (if d/c home, hoyer lift)  Recommendations for Other Services       Functional Status Assessment Patient has had a recent decline in their functional status and/or demonstrates limited ability to make significant improvements in function in a reasonable and predictable amount of time     Precautions / Restrictions Precautions Precautions: Fall Precaution Comments: sacral wound (healing, mepilex on), bilat prevalon boots Restrictions Weight Bearing Restrictions: No      Mobility  Bed Mobility Overal bed mobility: Needs Assistance Bed Mobility: Sit to Supine, Supine to Sit, Rolling Rolling: Mod  assist   Supine to sit: Mod assist Sit to supine: Max assist   General bed mobility comments: assist for trunk elevation, LE progression to EOB, and scoot up in bed with +2 assist upon return to supine. mod assist for rolling for completion of roll pt using UEs to assist on bedrails    Transfers                   General transfer comment: nt    Ambulation/Gait                  Stairs            Wheelchair Mobility     Tilt Bed    Modified Rankin (Stroke Patients Only)       Balance Overall balance assessment: Needs assistance Sitting-balance support: No upper extremity supported, Feet supported Sitting balance-Leahy Scale: Fair         Standing balance comment: nt                             Pertinent Vitals/Pain Pain Assessment Pain Assessment: Faces Faces Pain Scale: Hurts little more Pain Location: sacral wound Pain Descriptors / Indicators: Sore Pain Intervention(s): Monitored during session, Limited activity within patient's tolerance, Repositioned    Home Living Family/patient expects to be discharged to:: Private residence Living Arrangements: Spouse/significant other Available Help at Discharge: Family;Available 24 hours/day Type of Home: House Home Access: Ramped entrance       Home Layout: One level Home Equipment: Wheelchair - power;Wheelchair - manual;BSC/3in1      Prior Function Prior Level of Function : Needs assist  Mobility Comments: pt and husband report pt scoots to/from power w/c with assist from family, does not stand ADLs Comments: assist for wash up, dressing     Hand Dominance   Dominant Hand: Right    Extremity/Trunk Assessment   Upper Extremity Assessment Upper Extremity Assessment: Defer to OT evaluation    Lower Extremity Assessment Lower Extremity Assessment: Generalized weakness    Cervical / Trunk Assessment Cervical / Trunk Assessment: Kyphotic   Communication   Communication: No difficulties  Cognition Arousal/Alertness: Awake/alert Behavior During Therapy: WFL for tasks assessed/performed Overall Cognitive Status: Impaired/Different from baseline Area of Impairment: Problem solving                             Problem Solving: Difficulty sequencing, Requires verbal cues, Requires tactile cues, Slow processing General Comments: A&Ox4        General Comments General comments (skin integrity, edema, etc.): bilat feet with calluses and poor hygeine, sacral wound covered with dressing    Exercises     Assessment/Plan    PT Assessment Patient needs continued PT services  PT Problem List Decreased strength;Decreased mobility;Decreased activity tolerance;Decreased knowledge of use of DME;Decreased balance       PT Treatment Interventions DME instruction;Therapeutic activities;Therapeutic exercise;Patient/family education;Balance training;Functional mobility training;Neuromuscular re-education    PT Goals (Current goals can be found in the Care Plan section)  Acute Rehab PT Goals PT Goal Formulation: With patient Time For Goal Achievement: 09/16/22 Potential to Achieve Goals: Good    Frequency Min 1X/week     Co-evaluation               AM-PAC PT "6 Clicks" Mobility  Outcome Measure Help needed turning from your back to your side while in a flat bed without using bedrails?: A Lot Help needed moving from lying on your back to sitting on the side of a flat bed without using bedrails?: A Lot Help needed moving to and from a bed to a chair (including a wheelchair)?: A Lot Help needed standing up from a chair using your arms (e.g., wheelchair or bedside chair)?: Total Help needed to walk in hospital room?: Total Help needed climbing 3-5 steps with a railing? : Total 6 Click Score: 9    End of Session   Activity Tolerance: Patient limited by fatigue Patient left: in bed;with call bell/phone within  reach;with bed alarm set;with family/visitor present Nurse Communication: Mobility status PT Visit Diagnosis: Other abnormalities of gait and mobility (R26.89);Muscle weakness (generalized) (M62.81)    Time: 1610-9604 PT Time Calculation (min) (ACUTE ONLY): 21 min   Charges:   PT Evaluation $PT Eval Low Complexity: 1 Low   PT General Charges $$ ACUTE PT VISIT: 1 Visit         Marye Round, PT DPT Acute Rehabilitation Services Secure Chat Preferred  Office [phone]   Westyn Keatley E Christain [place] 09/02/2022, 1:54 PM

## 2022-09-03 DIAGNOSIS — I5031 Acute diastolic (congestive) heart failure: Secondary | ICD-10-CM

## 2022-09-03 DIAGNOSIS — J90 Pleural effusion, not elsewhere classified: Secondary | ICD-10-CM | POA: Diagnosis not present

## 2022-09-03 DIAGNOSIS — I5033 Acute on chronic diastolic (congestive) heart failure: Secondary | ICD-10-CM | POA: Diagnosis not present

## 2022-09-03 DIAGNOSIS — N179 Acute kidney failure, unspecified: Secondary | ICD-10-CM | POA: Diagnosis not present

## 2022-09-03 DIAGNOSIS — N184 Chronic kidney disease, stage 4 (severe): Secondary | ICD-10-CM | POA: Diagnosis not present

## 2022-09-03 LAB — RENAL FUNCTION PANEL
Albumin: 1.9 g/dL — ABNORMAL LOW (ref 3.5–5.0)
Anion gap: 12 (ref 5–15)
BUN: 74 mg/dL — ABNORMAL HIGH (ref 8–23)
CO2: 20 mmol/L — ABNORMAL LOW (ref 22–32)
Calcium: 6.4 mg/dL — CL (ref 8.9–10.3)
Chloride: 107 mmol/L (ref 98–111)
Creatinine, Ser: 3.33 mg/dL — ABNORMAL HIGH (ref 0.44–1.00)
GFR, Estimated: 14 mL/min — ABNORMAL LOW (ref >60)
Glucose, Bld: 96 mg/dL (ref 70–99)
Phosphorus: 5 mg/dL — ABNORMAL HIGH (ref 2.5–4.6)
Potassium: 3.8 mmol/L (ref 3.5–5.1)
Sodium: 139 mmol/L (ref 135–145)

## 2022-09-03 LAB — CBC
HCT: 25.7 % — ABNORMAL LOW (ref 36.0–46.0)
Hemoglobin: 7.8 g/dL — ABNORMAL LOW (ref 12.0–15.0)
MCH: 27.9 pg (ref 26.0–34.0)
MCHC: 30.4 g/dL (ref 30.0–36.0)
MCV: 91.8 fL (ref 80.0–100.0)
Platelets: 290 10*3/uL (ref 150–400)
RBC: 2.8 MIL/uL — ABNORMAL LOW (ref 3.87–5.11)
RDW: 15.1 % (ref 11.5–15.5)
WBC: 2.7 10*3/uL — ABNORMAL LOW (ref 4.0–10.5)
nRBC: 0 % (ref 0.0–0.2)

## 2022-09-03 LAB — GLUCOSE, CAPILLARY: Glucose-Capillary: 94 mg/dL (ref 70–99)

## 2022-09-03 MED ORDER — HYDRALAZINE HCL 50 MG PO TABS
50.0000 mg | ORAL_TABLET | Freq: Three times a day (TID) | ORAL | Status: DC
  Administered 2022-09-03 – 2022-09-06 (×9): 50 mg via ORAL
  Filled 2022-09-03 (×10): qty 1

## 2022-09-03 MED ORDER — BOOST PLUS PO LIQD
237.0000 mL | Freq: Two times a day (BID) | ORAL | Status: DC
  Administered 2022-09-03 – 2022-09-08 (×9): 237 mL via ORAL
  Filled 2022-09-03 (×15): qty 237

## 2022-09-03 MED ORDER — FUROSEMIDE 10 MG/ML IJ SOLN
12.0000 mg/h | INTRAVENOUS | Status: DC
  Administered 2022-09-03 – 2022-09-06 (×4): 12 mg/h via INTRAVENOUS
  Filled 2022-09-03 (×6): qty 20

## 2022-09-03 MED ORDER — FUROSEMIDE 10 MG/ML IJ SOLN
80.0000 mg | Freq: Four times a day (QID) | INTRAMUSCULAR | Status: DC
  Administered 2022-09-03: 80 mg via INTRAVENOUS
  Filled 2022-09-03: qty 8

## 2022-09-03 MED ORDER — CHLORHEXIDINE GLUCONATE CLOTH 2 % EX PADS
6.0000 | MEDICATED_PAD | Freq: Every day | CUTANEOUS | Status: DC
  Administered 2022-09-03 – 2022-09-08 (×6): 6 via TOPICAL

## 2022-09-03 MED ORDER — ASPIRIN 81 MG PO CHEW
81.0000 mg | CHEWABLE_TABLET | Freq: Every day | ORAL | Status: DC
  Administered 2022-09-03 – 2022-09-10 (×8): 81 mg via ORAL
  Filled 2022-09-03 (×8): qty 1

## 2022-09-03 MED ORDER — RENA-VITE PO TABS
1.0000 | ORAL_TABLET | Freq: Every day | ORAL | Status: DC
  Administered 2022-09-03 – 2022-09-09 (×7): 1 via ORAL
  Filled 2022-09-03 (×7): qty 1

## 2022-09-03 MED ORDER — CALCIUM CARBONATE 1250 (500 CA) MG PO TABS
1000.0000 mg | ORAL_TABLET | Freq: Three times a day (TID) | ORAL | Status: DC
  Administered 2022-09-03 – 2022-09-05 (×6): 2500 mg via ORAL
  Filled 2022-09-03 (×6): qty 2

## 2022-09-03 MED ORDER — ALBUTEROL SULFATE (2.5 MG/3ML) 0.083% IN NEBU: 2.5000 mg | INHALATION_SOLUTION | Freq: Four times a day (QID) | RESPIRATORY_TRACT | Status: DC | PRN

## 2022-09-03 NOTE — Progress Notes (Signed)
Initial Nutrition Assessment  DOCUMENTATION CODES:   Not applicable  INTERVENTION:  Liberalize diet to 2 gram sodium in setting of edema and CHF Chopped meats with meals for ease of cutting d/t hand edema Boost Plus po BID, each supplement provides 360 kcal and 14 grams of protein Renal MVI with minerals daily  NUTRITION DIAGNOSIS:   Increased nutrient needs related to acute illness as evidenced by estimated needs.  GOAL:   Patient will meet greater than or equal to 90% of their needs  MONITOR:   PO intake, Supplement acceptance, Labs, Weight trends, I & O's, Skin  REASON FOR ASSESSMENT:   Consult Assessment of nutrition requirement/status (Lactose intolerant, on renal diet)  ASSESSMENT:   Pt admitted from [facility] d/t severe volume overload with diffuse edema. PMH significant for HFpEF, severe AS and mild MR, CKD IV.   Pt endorses having a good appetite although later mentions that during admission she has only been eating about half her meals as the portions are large for her. She compares her portion sizes during admission to her intake at home.   At home pt was eating eggs for breakfast, subs or sandwiches and a variety of meats with vegetables. She and her husband deny use of added salt and try to minimize salt intake as best as possible.   Meal completions: 7/14: 90% breakfast 7/15: 75% breakfast, 100% lunch, 50% dinner 7/16: 50% breakfast, 50% lunch  Pt had been receiving Nepro shakes during admission however reports stomach discomfort d/t lactose intolerance. Pt is agreeable to trying Boost Plus as this is better suited for lactose intolerance.   Dry weight is unknown at this time. Pt reports that her weight has been trending up d/t ongoing edema. Suspect a degree of muscle deficits to be present however it is difficult to determine at this time d/t edema. Pt's weight noted to be +19.8 kg within the last 4 months.   Edema: moderate generalized, mid pitting BUE,  moderate pitting BLE  Medications: calcium carbonate, Vitamin D3, rena-vit, miralax, senna  Labs: BUN 74, Cr 3.33, calcium 6.4, phos 5.0, GFR 14, Vitamin D 19.12, CBG's 94, 82 x24 hours  UOP: x24 hours I/O's: - since admit  NUTRITION - FOCUSED PHYSICAL EXAM:  Flowsheet Row Most Recent Value  Orbital Region No depletion  Upper Arm Region Mild depletion  [LUE,  RUE with moderate edema]  Thoracic and Lumbar Region No depletion  Buccal Region No depletion  Temple Region No depletion  Clavicle Bone Region Mild depletion  Clavicle and Acromion Bone Region Mild depletion  Scapular Bone Region No depletion  Dorsal Hand No depletion  [moderate edema]  Patellar Region No depletion  Anterior Thigh Region Unable to assess  [in boots]  Posterior Calf Region Unable to assess  Edema (RD Assessment) Moderate  [BLE, RUE, bilateral hands]  Hair Reviewed  Eyes Reviewed  Mouth Reviewed  Skin Reviewed  Nails Reviewed       Diet Order:   Diet Order             Diet 2 gram sodium Room service appropriate? Yes; Fluid consistency: Thin; Fluid restriction: 1200 mL Fluid  Diet effective now                   EDUCATION NEEDS:   Education needs have been addressed  Skin:  Skin Assessment: Skin Integrity Issues: Skin Integrity Issues:: Other (Comment) Other: PI mid sacrum; skin tear R pelvis  Last BM:  7/16 (type 6  large)  Height:   Ht Readings from Last 1 Encounters:  08/30/22 5\' 1"  (1.549 m)    Weight:   Wt Readings from Last 1 Encounters:  09/03/22 85.7 kg    Ideal Body Weight:  47.7 kg  BMI:  Body mass index is 35.7 kg/m.  Estimated Nutritional Needs:   Kcal:  1300-1500  Protein:  65-80g  Fluid:  1.3-1.5L  Sherry [name], RDN, LDN Clinical Nutrition

## 2022-09-03 NOTE — Consult Note (Signed)
   [facility] Inpatient Consult   09/03/2022  BIANNEY ROCKWOOD [birth year] [id number]  [facility] [[facility]]  Accountable Care Organization [ACO] Patient: [employer] Medicare [Dual]  Primary Care Provider:  Gwenevere Abbot, MD, [facility] Internal Medicine which is listed to provide the transition of care follow up.  Patient screened for hospitalization with noted low risk score for unplanned readmission risk and to assess for potential [facility]  [[facility]] Care Management service needs for post hospital transition for care coordination.  Review of patient's electronic medical record reveals patient is admitted with HF exacerbation.  Met with patient and spouse at the bedside and introduced self and follow up with post hospital for needs and care.  Patient was generous and accepting and spouse endorses PCP and is active with and followed by [facility] while in community.  Plan:  Continue to follow progress and disposition to assess for post hospital community care coordination/management needs.  Referral request for community care coordination: following and anticipate TOC follow up calls on behalf of [facility].  Of note, [facility] Care Management/Population Health does not replace or interfere with any arrangements made by the Inpatient Transition of Care team.  For questions contact:   Charlesetta Shanks, RN BSN CCM [facility]  [phone] business mobile phone Toll free office [phone]  *Concierge Line  [phone] Fax number: [phone] [email or web address]

## 2022-09-03 NOTE — Consult Note (Addendum)
Advanced Heart Failure Team Consult Note   Primary Physician: Gwenevere Abbot, MD PCP-Cardiologist:  Tonny Bollman, MD  Reason for Consultation: Acute on Chronic Diastolic Heart Failure  HPI:    Sherry [name] is seen today for evaluation of acute exacerbation of diastolic heart failure at the request of Dr. Antony Contras.   77 y/o female w/ PMH of severe AS, HFpEF, CAD, HTN, CKD stage 4, and hx of AV block who presented with volume overload, bilateral pleural effusions, and AKI on CKD 4. BNP >2k, Cr 3.3, phos 5.0, EKG with type 1 AV block, and evidence of UTI on UA. She had progressive fluid accumulation in the weeks leading up to admission. Seen in [facility] and then failed trial of increased oral diuretic. Echo largely stable here with EF 60-65%, normal LV fxn, no RWMA, severe LVH, small pericardial effusion without tamponade, but there was worsening of AS with valve area 0.62 cm2, Vmax 4.56 m/s, and mean gradient 50 mmHg. Initially had inadequate response to IV diuresis but is now diuresing well. She still has significant upper and lower extremity edema, JVD, and crackles on exam but is not requiring any supplemental O2. She does not feel at baseline but does feel better after putting out 2 L of urine yesterday. At baseline she is power wheelchair bound with no difficulty transferring to her chair. She does get dyspneic and some chest discomfort when trying to sweep the floors at her house.  Heart Failure History - Cardiac MRI (11/19): EF 76%, moderate LVH, difficult to null LV myocardium with diffuse subendocardial LGE concerning for possible cardiac amyloidosis, moderate pericardial effusion.  - PYP scan (11/19): grade 0, H/CL 1.08.  - Echo (12/21): EF 60-65%, moderate LVH, normal RV, moderate pericardial effusion, moderate AS with AoV mean gradient 32, AVA 1.2 cm^2.  - Echo (11/22): EF 60-65%, severe LVH, normal RV size and systolic function, mild MR, paradoxical low flow/low gradient severe  aortic stenosis with mean gradient 24 mmHg and AVA 0.88 cm^2, small to moderate pericardial effusion.   - PYP scan (11/22): grade 1 with H/CL 1.07.  This is unlikely to be TTR cardiac amyloidosis. - [employer] gene testing negative for hATTR mutation.  - Echo (9/23): EF 60-65%, severe concentric LVH, normal RV, PASP 43, moderate pericardial effusion without tamponade, mild-moderate MR, moderate mitral stenosis with mean gradient 7 mmHg, severe low flow/low gradient AS mean gradient 30 with AVA 0.95 cm^2, IVC normal.  - Echo (7/24): EF 60-65%, severe concentric LVH, normal RV, moderate pericardial effusion without tamponade,  progressive severe AS (0.62 cm2, 5.46 m/s, 50 mmHg), dilated IVC   Home Medications Prior to Admission medications   Medication Sig Start Date End Date Taking? Authorizing Provider  amLODipine (NORVASC) 10 MG tablet Take 1 tablet (10 mg total) by mouth daily. 06/16/22 06/16/23 Yes Gwenevere Abbot, MD  aspirin 81 MG EC tablet Take 81 mg by mouth daily.   Yes [provider]  Calcium Carb-Cholecalciferol (CALCIUM 500 +D) 500-400 MG-UNIT TABS Take 1 tablet by mouth 2 (two) times daily. 10/25/15  Yes Kathlynn Grate, DO  cephALEXin (KEFLEX) 500 MG capsule Take 1 capsule (500 mg total) by mouth 2 (two) times daily for 10 days. 08/27/22 09/06/22 Yes Lovie Macadamia, MD  denosumab (PROLIA) 60 MG/ML SOSY injection Inject 60 mg into the skin every 6 (six) months. 11/28/21 11/28/22 Yes Champ Mungo, DO  empagliflozin (JARDIANCE) 10 MG TABS tablet Take 1 tablet (10 mg total) by mouth daily before breakfast. NEEDS  FOLLOW UP APPOINTMENT FOR MORE REFILLS 08/27/22  Yes Lovie Macadamia, MD  febuxostat (ULORIC) 40 MG tablet Take 20 mg by mouth daily. 08/07/22  Yes [provider]  furosemide (LASIX) 20 MG tablet Take 3 tablets (60 mg total) by mouth 2 (two) times daily. 08/27/22  Yes Lovie Macadamia, MD  hydrALAZINE (APRESOLINE) 100 MG tablet Take 1 tablet (100 mg total) by mouth 3  (three) times daily. 02/27/22 02/27/23 Yes Gwenevere Abbot, MD  Hydrocortisone (PREPARATION H EX) Apply 1 Application topically 2 (two) times daily as needed (Hemorrhoids).   Yes [provider]  pantoprazole (PROTONIX) 40 MG tablet TAKE 1 TABLET BY MOUTH DAILY 03/12/22  Yes Gwenevere Abbot, MD  PHENobarbital (LUMINAL) 64.8 MG tablet Take 2 tablets (129.6 mg total) by mouth at bedtime. 03/19/22  Yes Van Clines, MD  potassium chloride (KLOR-CON) 10 MEQ tablet TAKE 1 TABLET(10 MEQ) BY MOUTH DAILY 04/29/22  Yes Milford, Anderson Malta, FNP  rosuvastatin (CRESTOR) 40 MG tablet Take 1 tablet (40 mg total) by mouth daily. 01/14/22 01/09/23 Yes Milford, Anderson Malta, FNP  zonisamide (ZONEGRAN) 100 MG capsule TAKE 2 CAPSULES BY MOUTH AT  NIGHT 08/07/22  Yes Van Clines, MD    Past Medical History: Past Medical History:  Diagnosis Date   AKI (acute kidney injury) (HCC) 01/31/2020   Anemia    Aortic stenosis    Arthritis    Bilateral lower extremity edema    Bilateral lower extremity edema    Blurry vision, bilateral 05/08/2016   Bradycardia    a. 01/2013 - asymptomatic.   Chronic diastolic heart failure (HCC)    Chronic sinus bradycardia 07/02/2012   CKD (chronic kidney disease)    a. baseline CKD stage III   Coronary artery disease    Epilepsy (HCC) 06/04/2006   EEG 02/19/12 " Interpretation:  This is an abnormal EEG demonstrating continuous and generalized slowing of the background in the theta frequency. Irregular rhythm of EKG noted.  Clinical correlation: the generalized slowing is consistent with a mild encephalopathy of nonspecific etiologies for which the differential would include infectious, toxic, metabolic, inflammatory, and vascular etiologies.    GERD (gastroesophageal reflux disease)    Headache(784.0)    Heart block AV second degree 02/15/2013   Heart murmur    Hyperlipidemia    Hypertension    Hypotension    Hypovolemic shock (HCC)    Internal and external hemorrhoids without  complication 01/20/2012   Colonoscopy in 2008 recs repeat in 5 years   Intractable focal epilepsy with impairment of consciousness (HCC) 01/02/2012   Late effects of cerebrovascular disease 01/23/2012   MRI Brain -Calvarium/skull base: No focal marrow replacing lesion suggestive of neoplasm. -Orbits: Grossly unremarkable. -Paranasal sinuses: Imaged portions clear. -Brain: No acute abnormalities such as hemorrhage, hydrocephalus, acute ischemia, or evidence of a mass lesion.  There is patchy bilateral cerebral white matter T2 and flair signal hyper intensities.  Centimeter or smaller CSF intensity   Mobitz (type) I (Wenckebach's) atrioventricular block    a. 01/2013 - asymptomatic.   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   TIA (transient ischemic attack) 2014   pt stated she had "mini strokes"   Vitamin D deficiency 04/22/2014    Past Surgical History: Past Surgical History:  Procedure Laterality Date   ABDOMINAL HYSTERECTOMY  LEFT AND RIGHT HEART CATHETERIZATION WITH CORONARY ANGIOGRAM N/A 05/24/2013   Procedure: LEFT AND RIGHT HEART CATHETERIZATION WITH CORONARY ANGIOGRAM;  Surgeon: Lesleigh Noe, MD;  Location: [facility] CATH LAB;  Service: Cardiovascular;  Laterality: N/A;   LEFT HEART CATH N/A 02/01/2020   Procedure: Left Heart Cath;  Surgeon: Dolores Patty, MD;  Location: [facility] INVASIVE CV LAB;  Service: Cardiovascular;  Laterality: N/A;   RIGHT HEART CATH N/A 02/01/2020   Procedure: RIGHT HEART CATH;  Surgeon: Dolores Patty, MD;  Location: [facility] INVASIVE CV LAB;  Service: Cardiovascular;  Laterality: N/A;    Family History: Family History  Problem Relation Age of Onset   Hypertension Other    Heart disease Mother    Hypertension Mother    Cancer Father    Obesity Son    Asthma Sister    Heart disease Sister     Heart disease Sister     Social History: Social History   Socioeconomic History   Marital status: Married    Spouse name: Not on file   Number of children: 2   Years of education: 4   Highest education level: Not on file  Occupational History   Occupation: retired  Tobacco Use   Smoking status: Former    Current packs/day: 0.00    Average packs/day: 0.5 packs/day for 10.0 years (5.0 ttl pk-yrs)    Types: Cigarettes    Start date: 06/04/1990    Quit date: 06/03/2000    Years since quitting: 22.2   Smokeless tobacco: Never  Vaping Use   Vaping status: Never Used  Substance and Sexual Activity   Alcohol use: No    Alcohol/week: 0.0 standard drinks of alcohol   Drug use: No   Sexual activity: Not Currently    Partners: Male  Other Topics Concern   Not on file  Social History Narrative   Current Social History 06/20/2020        Patient lives with spouse in a home which is 1 story. There are not steps up to the entrance, the patient uses a ramp.       Patient's method of transportation is [employer].      The highest level of education was some high school.      The patient currently disabled.      Identified important Relationships are "God and my husband"       Pets : None       Interests / Fun: "Sitting on my front porch"       Current Stressors: None              Social Determinants of Health   Financial Resource Strain: Not on file  Food Insecurity: No Food Insecurity (08/30/2022)   Hunger Vital Sign    Worried About Running Out of Food in the Last Year: Never true    Ran Out of Food in the Last Year: Never true  Transportation Needs: No Transportation Needs (08/30/2022)   PRAPARE - Administrator, Civil Service (Medical): No    Lack of Transportation (Non-Medical): No  Physical Activity: Inactive (10/17/2021)   Exercise Vital Sign    Days of Exercise per Week: 0 days    Minutes of Exercise per Session: 0 min  Stress: No Stress Concern Present  (10/17/2021)   [employer] of Occupational Health - Occupational Stress Questionnaire    Feeling of Stress : Only a little  Social Connections: Not on file  Allergies:  Allergies  Allergen Reactions   Ibuprofen Other (See Comments)    Dr advised pt not to take ibuprofen    Objective:    Vital Signs:   Temp:  [97.6 F (36.4 C)-98.4 F (36.9 C)] 98.3 F (36.8 C) (07/16 0400) Pulse Rate:  [82-89] 83 (07/16 0800) Resp:  [15-20] 20 (07/16 0400) BP: (118-150)/(70-98) 142/82 (07/16 0800) SpO2:  [98 %-100 %] 99 % (07/16 0800) Weight:  [85.7 kg] 85.7 kg (07/16 0400) Last BM Date : 09/01/22  Weight change: Filed Weights   08/31/22 0609 09/02/22 0355 09/03/22 0400  Weight: 84.5 kg 84.6 kg 85.7 kg    Intake/Output:   Intake/Output Summary (Last 24 hours) at 09/03/2022 0846 Last data filed at 09/03/2022 0456 Gross per 24 hour  Intake 200 ml  Output 2300 ml  Net -2100 ml      Physical Exam    General:  Well appearing. No resp difficulty HEENT: normal Neck: supple. JVD present .  Cor: RRR with frequent PVCs. Loud systolic murmur over RUSB. Lungs: crackles in the bases bilaterally Abdomen: soft, nontender, nondistended. Extremities:2+ pitting edema in the upper and lower extremities bilaterally Neuro: alert & orientedx3, cranial nerves grossly intact. moves all 4 extremities w/o difficulty. Affect pleasant   Telemetry   1st degree AV block with frequent PVCs  EKG    08/30/2022 SR w/ 2nd degree AV block type 1 08/31/2022 SR w/ 1st degree AV block and PVCs  Labs   Basic Metabolic Panel: Recent Labs  Lab 08/30/22 1514 08/30/22 1826 08/31/22 0651 09/01/22 0029 09/02/22 0046 09/02/22 1548 09/03/22 0623  NA  --    < > 141 140 139 139 139  K  --    < > 3.9 3.9 4.0 3.9 3.8  CL  --    < > 111 111 110 107 107  CO2  --    < > 18* 20* 21* 18* 20*  GLUCOSE  --    < > 85 94 98 130* 96  BUN  --    < > 64* 66* 70* 72* 74*  CREATININE  --    < > 3.20* 3.32*  3.58* 3.34* 3.33*  CALCIUM  --    < > 6.4* 6.4* 6.3* 6.5* 6.4*  MG 2.0  --  2.0  --  2.0  --   --   PHOS 5.0*  --  5.1* 4.8* 4.9* 4.9* 5.0*   < > = values in this interval not displayed.    Liver Function Tests: Recent Labs  Lab 08/27/22 1200 08/30/22 1130 08/30/22 1826 08/31/22 0651 09/01/22 0029 09/02/22 0046 09/02/22 1548 09/03/22 0623  AST 41 41 40 56*  --   --   --   --   ALT 28 28 24 30   --   --   --   --   ALKPHOS 103 114 103 90  --   --   --   --   BILITOT 0.6 0.4 0.5 0.2*  --   --   --   --   PROT 6.5 6.7 6.3* 5.4*  --   --   --   --   ALBUMIN 2.7* 2.6* 2.5* 2.0*  2.0* 2.0* 2.0* 2.0* 1.9*   No results for input(s): "LIPASE", "AMYLASE" in the last 168 hours. No results for input(s): "AMMONIA" in the last 168 hours.  CBC: Recent Labs  Lab 08/30/22 1514 08/30/22 1635 09/01/22 0029 09/02/22 0046 09/03/22 0623  WBC 2.2* 2.0* 2.6*  2.6* 2.7*  NEUTROABS  --  1.2*  --   --   --   HGB 7.8* 8.2* 7.1* 7.3* 7.8*  HCT 26.2* 27.1* 23.3* 24.3* 25.7*  MCV 94.2 96.1 95.5 95.7 91.8  PLT 300 268 283 287 290    Cardiac Enzymes: No results for input(s): "CKTOTAL", "CKMB", "CKMBINDEX", "TROPONINI" in the last 168 hours.  BNP: BNP (last 3 results) Recent Labs    06/14/22 1152 08/27/22 1200 08/30/22 1130  BNP 711.2* 2,010.8* 2,133.4*    ProBNP (last 3 results) No results for input(s): "PROBNP" in the last 8760 hours.   CBG: Recent Labs  Lab 09/01/22 0426 09/01/22 0737 09/01/22 0804 09/02/22 0724 09/03/22 0724  GLUCAP 75 66* 106* 82 94    Coagulation Studies: No results for input(s): "LABPROT", "INR" in the last 72 hours.   Imaging   No results found.   Medications:     Current Medications:  Chlorhexidine Gluconate Cloth  6 each Topical Daily   cholecalciferol  1,000 Units Oral Daily   enoxaparin (LOVENOX) injection  30 mg Subcutaneous Q24H   feeding supplement (NEPRO CARB STEADY)  237 mL Oral BID BM   furosemide  80 mg Intravenous Q6H    pantoprazole  40 mg Oral Daily   PHENobarbital  129.6 mg Oral QHS   polyethylene glycol  17 g Oral Daily   rosuvastatin  40 mg Oral Daily   senna-docusate  2 tablet Oral BID   zonisamide  200 mg Oral QHS     Patient Profile   77 y/o female w/ PMH of severe AS, HFpEF, CAD, HTN, CKD stage 4, and hx of AV block who presented with volume overload, bilateral pleural effusions, and AKI on CKD 4.  Assessment/Plan   Acute on Chronic Diastolic Heart Failure  Extensive workup most consistent with hypertensive/obstructive/ischemic cardiomyopathy  Cardiac MRI was concerning for cardiac amyloidosis (11/19), as was echo with moderate LVH and aortic stenosis. The aortic stenosis and the pericardial effusion seen by echo are also frequently seen with cardiac amyloidosis. She additionally has tingling/numbness in hands/feet that is consistent with peripheral neuropathy. However, PYP scan in 11/19 was negative and myeloma workup was negative. Repeat PYP scan in 11/22 was again negative and she did not have a transthyretin gene mutation on [employer] gene testing. Myeloma workup was again negative in 10/22. Therefore, cardiac amyloidosis seems unlikely and LVH may be due to HTN and aortic stenosis though delayed enhancement from MRI is more suggestive of infiltrative disease.  Stable Echo here with worsening aortic stenosis (described below) Renal function and AV block limiting GDMT, avoid ACE/ARB/ARNI/BB On Lasix 60 mg BID and Jardiance 10 mg daily at home Exacerbation likely 2/2 inadequate oral diuretic dose with progressive renal disease and worsening aortic stenosis Diuresing well but still has significant edema. 2 L out last 24 hours on Lasix 80 mg IV TID Jardiance not restarted, possible UTI on admission, could resume during admission if renal function stabilizes or improves, continue to hold today Start lasix drip 12 mg/hr, strict in/out, continue foley, daily weights, daily RFP Severe Aortic  Stenosis Worsening of AS, previously paradoxical low flow/gradient AS but TTE here shows gradient of 50 mmHg, area of 0.62 cm2, and Vmax of 4.56 m/s. 12/21 LHC/RHC and echo were suggestive of moderate aortic stenosis.  Repeat echo in 11/22 showed paradoxical low flow/low gradient severe aortic stenosis, echo in 9/23 was similar.  She was seen by Dr. Excell Seltzer for TAVR evaluation, last seen 04/2022.  At that  time they decided not to move forward with TAVR because no symptom burden, minimal activity, and comorbidities including CKD stage IV and AV block with high likelihood of of post TAVR pacemaker requirement. Still minimally active but having symptoms with activity such as sweeping the floor in her powered chair Could be contributing to heart failure exacerbation and AKI/progressive CKD We will discuss with Dr. Excell Seltzer for TAVR eval AKI on CKD stage IV Cr up to 3.3 here with baseline 2.8 Followed by [facility] outpatient Good urine output on increased lasix dose, continue to monitor RFP daily Recommend nephro eval if worsening Cr or decreased urine output Avoid nephrotoxic agents as above 1st/2nd degree AV block Long history of 1st degree AV block with PR interval 392 ms here, admission EKG with 2nd degree type 1 block Increased risk of post TAVR AV block needing pacemaker placement Continue to avoid BB and other AV nodal blocking agents CAD LHC (4/15) with 80% and 50% tandem stenoses in the proximal LAD, 80% mid LCx, 85% dLCx, diffuse mid-distal disease up to 90% in RCA. Diffuse coronary disease, treated medically  No evidence of ACS on admission or follow up EKG, no chest pain Would likely need pre-op LHC/RHC for TAVR if this becomes an option Continue aspirin and statin HTN Home medications of hydralazine 100 mg tid and amlodipine which was discontinued outpatient  Hydralazine and amlodipine held on admission Elevated BP today (140s/90s) but fluctuating throughout admission Resume hydralazine at 50  mg tid GOC Agree with primary team to begin goals of care discussions. If she is a candidate for TAVR she will need pre-op heart cath which could worsen her CKD 4/5 and risk dialysis. She would also be high risk for post TAVR AV block requiring pacemaker.   Length of Stay: 3  Rocky Morel, DO  09/03/2022, 8:46 AM  Advanced Heart Failure Team Pager [phone] (M-F; 7a - 5p)  Please contact [facility] for night-coverage after hours (4p -7a ) and weekends on [email or web address]  Patient seen with resident, agree with the above note.   Mrs Dooner has extensive history as noted above.  She was admitted with about 2 weeks of worsening dyspnea and peripheral edema.  At baseline, she is wheelchair-bound primarily due to knee arthritis.  Very little activity at baseline.  Baseline creatinine around 2.8, up to 3.35 today.    Echo reviewed, EF 60-65%, severe LVH, normal RV, small to moderate pericardial effusion (unchanged), severe AS with mean gradient 50 mmHg (AS has been severe in past but mean gradient has progressed).    She has been diuresed with IV Lasix, UOP improving on tid IV Lasix (-1920 cc yesterday net).   She has a long 1st degree AVB and had an episode of type 1 2nd degree AVB.   General: NAD Neck: JVP 16 cm, no thyromegaly or thyroid nodule.  Lungs: Decreased at bases. CV: Nondisplaced PMI.  Heart regular S1/S2, no S3/S4, 3/6 SEM RUSB.  2+ edema to knees and 1+ edema of forearms.  No carotid bruit.  Difficult to palpate pedal pulses.  Abdomen: Soft, nontender, no hepatosplenomegaly, no distention.  Skin: Intact without lesions or rashes.  Neurologic: Alert and oriented x 3.  Psych: Normal affect. Extremities: No clubbing or cyanosis.  HEENT: Normal.   1. Aortic stenosis: 12/21 LHC/RHC and echo were suggestive of moderate aortic stenosis.  Repeat echo in 11/22 showed paradoxical low flow/low gradient severe aortic stenosis, echo in 9/23 was similar.  She was seen by Dr. Excell Seltzer  for  TAVR evaluation.  She is very limited in terms of activity, basically wheel-chair bound at baseline. She has a long 1st degree AVB and has episodes of type 1 2nd degree AVB.  She also has CKD stage IV.  TAVR could be done, but high risk for progression to ESRD and need for pacemaker.  She would have a hard time with HD due to lack of mobility.  Echo this admission with severe AS, mean gradient worsened to 50 mmHg.  She is more symptomatic.  - Will discuss with Dr. Excell Seltzer, but think she may be poor candidate for TAVR.  2. CAD: Moderate-severe diffuse CAD on coronary angiography in 4/15. She would not be a candidate for CABG due to lack of mobility. She has occasional chest discomfort with activities but this is not prominent.   - Continue ASA 81.  - Continue Crestor. - If we were to pursue TAVR, would need coronary angiography.  This would be difficult with CKD stage IV.  3. AKI on CKD stage IV: Baseline creatinine around 2.5+, up to 3.35 this admission.   - Follow closely with diuresis.  4. Acute on chronic diastolic CHF: Cardiac MRI was concerning for cardiac amyloidosis (11/19), as was echo with moderate LVH and aortic stenosis. The aortic stenosis and the pericardial effusion seen by echo are also frequently seen with cardiac amyloidosis. She additionally has tingling/numbness in hands/feet that is consistent with peripheral neuropathy.  However, PYP scan in 11/19 was negative and myeloma workup was negative.  Repeat PYP scan in 11/22 was again negative and she did not have a transthyretin gene mutation on [employer] gene testing.  Myeloma workup was again negative in 10/22.  Therefore, cardiac amyloidosis seems unlikely and LVH may be due to HTN and aortic stenosis though delayed enhancement from MRI is more suggestive of infiltrative disease. Echo this admission shows EF 60-65%, severe LVH, normal RV, small to moderate pericardial effusion (unchanged), severe AS with mean gradient 50 mmHg (AS has been  severe in past but mean gradient has progressed).  She reports dyspnea x 2 wks and has significant volume overload on exam, complicated by CKD stage IV.  I suspect that her CHF is driven by severe AS.  - Transition to Lasix gtt 12 mg/hr, follow response.  - No ARB/ARNi or spiro with CKD - Hold Jardiance for now until creatinine stabilizes.  5. HTN: BP elevated today.  - Restart hydralazine 50 mg bid.  - Can restart amlodipine next.   6. Conduction abnormality: Long 1st degree AVB, also runs of type 1 2nd degree AVB have been noted.   - Avoid beta blockade.  - High risk for progressive HB with TAVR, would likely end up with PPM.  Marca Ancona 09/03/2022 2:30 PM

## 2022-09-03 NOTE — TOC Initial Note (Signed)
Transition of Care [facility]) - Initial/Assessment Note    Patient Details  Name: Sherry [name] MRN: [id number] Date of Birth: 04-Apr-1945  Transition of Care [facility]) CM/SW Contact:    Reva Bores, LCSWA Phone Number: 09/03/2022, 12:58 PM  Clinical Narrative:  CSW met with the pt at bedside. The pt's husband was in the room. Pt stated that she does have a PCP, CSW will schedule a follow up appointment. Pt does not have a scale. CSW and patient discussed heart healthy diet and daily weight monitoring. TOC will continue to follow.                  Barriers to Discharge: Continued Medical Work up   Patient Goals and CMS Choice            Expected Discharge Plan and Services       Living arrangements for the past 2 months: Single Family Home                                      Prior Living Arrangements/Services Living arrangements for the past 2 months: Single Family Home Lives with:: Spouse Patient language and need for interpreter reviewed:: Yes Do you feel safe going back to the place where you live?: Yes      Need for Family Participation in Patient Care: No (Comment) Care giver support system in place?: No (comment)   Criminal Activity/Legal Involvement Pertinent to Current Situation/Hospitalization: No - Comment as needed  Activities of Daily Living Home Assistive Devices/Equipment: Wheelchair ADL Screening (condition at time of admission) Patient's cognitive ability adequate to safely complete daily activities?: Yes Is the patient deaf or have difficulty hearing?: Yes Does the patient have difficulty seeing, even when wearing glasses/contacts?: Yes Does the patient have difficulty concentrating, remembering, or making decisions?: No Patient able to express need for assistance with ADLs?: Yes Does the patient have difficulty dressing or bathing?: Yes Independently performs ADLs?: No Communication: Independent Walks in Home: Dependent Does the  patient have difficulty walking or climbing stairs?: Yes Weakness of Legs: Both Weakness of Arms/Hands: Both  Permission Sought/Granted                  Emotional Assessment Appearance:: Appears stated age Attitude/Demeanor/Rapport: Engaged Affect (typically observed): Appropriate Orientation: : Oriented to Self, Oriented to Place, Oriented to  Time, Oriented to Situation      Admission diagnosis:  Hypocalcemia [E83.51] Patient Active Problem List   Diagnosis Date Noted   Acute diastolic congestive heart failure (HCC) 08/31/2022   Electrolyte abnormality 08/30/2022   Hypocalcemia 08/27/2022   Swelling of lower extremity 06/14/2022   UTI (urinary tract infection) 03/07/2022   Onychauxis 10/19/2021   Seizures (HCC)    Hyperlipidemia    GERD (gastroesophageal reflux disease)    Osteoporosis 10/11/2015   Coronary artery disease    1st degree AV block 03/05/2013   Severe aortic stenosis 02/18/2013   Chronic diastolic congestive heart failure (HCC) 11/11/2012   Multiple lacunar infarcts (HCC) 05/07/2012   Degenerative cervical disc 01/20/2012   Acute kidney injury superimposed on CKD (HCC) 04/28/2011   Abnormality of gait- basically wheelchair bound 07/31/2009   Essential hypertension 06/04/2006   Osteoarthritis of lumbar spine 06/04/2006   PCP:  Gwenevere Abbot, MD Pharmacy:   [id number] - [place], [place] - [address] AT [facility] [address] [place] [address]  Phone: [phone] Fax: [phone]  [facility] - [place], [place] - [address] [place] [address] Phone: [phone] Fax: [phone]     Social Determinants of Health (SDOH) Social History: SDOH Screenings   Food Insecurity: No Food Insecurity (08/30/2022)  Housing: Patient Declined (08/30/2022)  Transportation Needs: No Transportation Needs (08/30/2022)  Utilities: Not At Risk (08/30/2022)  Depression (PHQ2-9): Low  Risk  (06/14/2022)  Physical Activity: Inactive (10/17/2021)  Stress: No Stress Concern Present (10/17/2021)  Tobacco Use: Medium Risk (08/30/2022)   SDOH Interventions:     Readmission Risk Interventions     No data to display

## 2022-09-03 NOTE — Plan of Care (Signed)
  Problem: Education: Goal: Knowledge of General Education information will improve Description: Including pain rating scale, medication(s)/side effects and non-pharmacologic comfort measures Outcome: Progressing   Problem: Health Behavior/Discharge Planning: Goal: Ability to manage health-related needs will improve Outcome: Progressing   Problem: Clinical Measurements: Goal: Ability to maintain clinical measurements within normal limits will improve Outcome: Progressing Goal: Will remain free from infection Outcome: Progressing Goal: Diagnostic test results will improve Outcome: Progressing Goal: Respiratory complications will improve Outcome: Progressing Goal: Cardiovascular complication will be avoided Outcome: Progressing   Problem: Activity: Goal: Risk for activity intolerance will decrease Outcome: Progressing   Problem: Nutrition: Goal: Adequate nutrition will be maintained Outcome: Progressing   Problem: Coping: Goal: Level of anxiety will decrease Outcome: Progressing   Problem: Elimination: Goal: Will not experience complications related to bowel motility Outcome: Progressing Goal: Will not experience complications related to urinary retention Outcome: Progressing Patient strict I/O's. Foley catheter in place. Lasix gtt added to regimen.   Problem: Pain Managment: Goal: General experience of comfort will improve Outcome: Progressing   Problem: Safety: Goal: Ability to remain free from injury will improve Outcome: Progressing   Problem: Skin Integrity: Goal: Risk for impaired skin integrity will decrease Outcome: Progressing

## 2022-09-03 NOTE — TOC Progression Note (Signed)
Transition of Care [facility]) - Progression Note    Patient Details  Name: Sherry [name] MRN: [id number] Date of Birth: 18-Apr-1945  Transition of Care [facility]) CM/SW Contact  Reva Bores, [place] Phone Number: 09/03/2022, 1:01 PM  Clinical Narrative: CSW met with the family to bring pt a scale to take home with her for daily weight management. TOC will continue to follow.         Barriers to Discharge: Continued Medical Work up  Expected Discharge Plan and Services       Living arrangements for the past 2 months: Single Family Home                                       Social Determinants of Health (SDOH) Interventions SDOH Screenings   Food Insecurity: No Food Insecurity (08/30/2022)  Housing: Patient Declined (08/30/2022)  Transportation Needs: No Transportation Needs (08/30/2022)  Utilities: Not At Risk (08/30/2022)  Depression (PHQ2-9): Low Risk  (06/14/2022)  Physical Activity: Inactive (10/17/2021)  Stress: No Stress Concern Present (10/17/2021)  Tobacco Use: Medium Risk (08/30/2022)    Readmission Risk Interventions     No data to display

## 2022-09-03 NOTE — Care Management Important Message (Signed)
Important Message  Patient Details  Name: Sherry [name] MRN: [id number] Date of Birth: 11-11-1945   Medicare Important Message Given:  Yes     Renie Ora 09/03/2022, 8:26 AM

## 2022-09-03 NOTE — Evaluation (Signed)
Occupational Therapy Evaluation Patient Details Name: Sherry [name] MRN: [id number] DOB: [birth year] Today's Date: 09/03/2022   History of Present Illness 77 yo female [facility] as dierct admit from internal medicine center with concern for acute HF exacerbation, UTI, AKI, electrolyte imbalance. PMH includes HTN, type I AV block, Aortic Stenosis, HLD,  diastolic CHF, CKD IV, osteoporosis, seizure disorder, and history of CVA.   Clinical Impression   Patient admitted for the diagnosis above.  PTA she lives with spouse and relies on spouse and family for iADL, ADL and mobility assist.  Patient may be very close to baseline, but OT will follow in the acute setting to encourage EOB sitting, improved sitting balance and ADL participation.  No post acute OT is anticipated given family support.        Recommendations for follow up therapy are one component of a multi-disciplinary discharge planning process, led by the attending physician.  Recommendations may be updated based on patient status, additional functional criteria and insurance authorization.   Assistance Recommended at Discharge Frequent or constant Supervision/Assistance  Patient can return home with the following Help with stairs or ramp for entrance;Assist for transportation;Assistance with cooking/housework;A lot of help with bathing/dressing/bathroom;Two people to help with walking and/or transfers    Functional Status Assessment  Patient has had a recent decline in their functional status and demonstrates the ability to make significant improvements in function in a reasonable and predictable amount of time.  Equipment Recommendations  None recommended by OT    Recommendations for Other Services       Precautions / Restrictions Precautions Precautions: Fall Precaution Comments: sacral wound (healing, mepilex on), bilat prevalon boots Restrictions Weight Bearing Restrictions: No      Mobility Bed  Mobility Overal bed mobility: Needs Assistance Bed Mobility: Supine to Sit, Sit to Supine     Supine to sit: Mod assist Sit to supine: Max assist        Transfers                          Balance Overall balance assessment: Needs assistance Sitting-balance support: Bilateral upper extremity supported, Single extremity supported, Feet supported Sitting balance-Leahy Scale: Fair                                     ADL either performed or assessed with clinical judgement   ADL       Grooming: Wash/dry hands;Wash/dry face;Minimal assistance;Sitting   Upper Body Bathing: Minimal assistance;Sitting       Upper Body Dressing : Moderate assistance;Sitting                           Vision Patient Visual Report: No change from baseline                  Pertinent Vitals/Pain Pain Assessment Pain Assessment: No/denies pain Pain Intervention(s): Monitored during session     Hand Dominance Right   Extremity/Trunk Assessment Upper Extremity Assessment Upper Extremity Assessment: Generalized weakness;RUE deficits/detail;LUE deficits/detail RUE Deficits / Details: significant swelling to hand and wrist LUE Deficits / Details: significant swelling to hand and wrist   Lower Extremity Assessment Lower Extremity Assessment: Defer to PT evaluation   Cervical / Trunk Assessment Cervical / Trunk Assessment: Kyphotic   Communication Communication Communication: No difficulties   Cognition Arousal/Alertness:  Awake/alert Behavior During Therapy: WFL for tasks assessed/performed Overall Cognitive Status: Impaired/Different from baseline Area of Impairment: Memory                     Memory: Decreased short-term memory                Home Living Family/patient expects to be discharged to:: Private residence Living Arrangements: Spouse/significant other Available Help at Discharge: Family;Available 24 hours/day Type of  Home: House Home Access: Ramped entrance     Home Layout: One level     Bathroom Shower/Tub: Sponge bathes at baseline   Bathroom Toilet: Standard Bathroom Accessibility: Yes How Accessible: Accessible via walker Home Equipment: Wheelchair - power;Wheelchair - manual;BSC/3in1          Prior Functioning/Environment Prior Level of Function : Needs assist             Mobility Comments: pt and husband report pt scoots to/from power w/c with assist from family, does not stand ADLs Comments: assist for wash up, dressing        OT Problem List: Decreased strength;Decreased activity tolerance;Impaired balance (sitting and/or standing);Increased edema      OT Treatment/Interventions: Self-care/ADL training;Therapeutic activities;Patient/family education;Balance training    OT Goals(Current goals can be found in the care plan section) Acute Rehab OT Goals Patient Stated Goal: Return home OT Goal Formulation: With patient Time For Goal Achievement: 09/17/22 Potential to Achieve Goals: Good ADL Goals Pt Will Perform Grooming: with supervision;sitting Pt Will Perform Upper Body Bathing: with min guard assist;sitting Pt Will Perform Upper Body Dressing: with min guard assist;sitting Additional ADL Goal #1: Bed mobility with Min A to reduce CG burden.  OT Frequency: Min 1X/week    Co-evaluation              AM-PAC OT "6 Clicks" Daily Activity     Outcome Measure Help from another person eating meals?: A Little Help from another person taking care of personal grooming?: A Little Help from another person toileting, which includes using toliet, bedpan, or urinal?: A Lot Help from another person bathing (including washing, rinsing, drying)?: A Lot Help from another person to put on and taking off regular upper body clothing?: A Lot Help from another person to put on and taking off regular lower body clothing?: A Lot 6 Click Score: 14   End of Session Nurse Communication:  Mobility status  Activity Tolerance: Patient tolerated treatment well Patient left: in bed;with call bell/phone within reach;with bed alarm set;with family/visitor present  OT Visit Diagnosis: Muscle weakness (generalized) (M62.81)                Time: 1610-9604 OT Time Calculation (min): 21 min Charges:  OT General Charges $OT Visit: 1 Visit OT Evaluation $OT Eval Moderate Complexity: 1 Mod  09/03/2022  RP, OTR/L  Acute Rehabilitation Services  Office:  [phone]   Suzanna Obey 09/03/2022, 3:56 PM

## 2022-09-03 NOTE — Plan of Care (Signed)
°  Problem: Education: °Goal: Knowledge of General Education information will improve °Description: Including pain rating scale, medication(s)/side effects and non-pharmacologic comfort measures °Outcome: Progressing °  °Problem: Health Behavior/Discharge Planning: °Goal: Ability to manage health-related needs will improve °Outcome: Progressing °  °Problem: Clinical Measurements: °Goal: Ability to maintain clinical measurements within normal limits will improve °Outcome: Progressing °Goal: Respiratory complications will improve °Outcome: Progressing °Goal: Cardiovascular complication will be avoided °Outcome: Progressing °  °Problem: Activity: °Goal: Risk for activity intolerance will decrease °Outcome: Progressing °  °Problem: Nutrition: °Goal: Adequate nutrition will be maintained °Outcome: Progressing °  °

## 2022-09-03 NOTE — Progress Notes (Addendum)
HD#3 SUBJECTIVE:  Patient Summary: Sherry [name] is a 77 y.o. with a pertinent PMH of HFpEF (60-65% EF), severe AS, mild MR, and CKD 4 who presented for volume overload and directly admitted from [facility] on 7/12 for acute heart failure, AKI on CKD, UTI, and electrolyte disturbances including hypocalcemia.   Overnight Events:  No overnight events. Dr. Geraldo Pitter was finishing his patient interview for cardiology. Husband is at bedside. Patient complained of gas that disrupted sleep, stated she is lactose intolerant. Patient reports improved breathing and cough, stated she is not having to elevate the bed as much to sleep. Patient believes her shortness of breath is improving. Denied any chest pain, N/V, or dysuria. Discussed the need for the cardiology consult and potential palliative care consult given her progressive heart and kidney disease. Patient briefly discussed her wishes for advanced treatments with husband and team in the room. Will continue that conversation when palliative care team is engaged.   Interim History:   OBJECTIVE:  Vital Signs: Vitals:   09/02/22 2009 09/03/22 0111 09/03/22 0400 09/03/22 0800  BP: 128/75 (!) 147/90 (!) 150/98 (!) 142/82  Pulse: 86 87 84 83  Resp: 20 20 20    Temp: 98.3 F (36.8 C) 98.3 F (36.8 C) 98.3 F (36.8 C)   TempSrc: Oral Oral Oral   SpO2: 99% 100% 100% 99%  Weight:   85.7 kg    Supplemental O2: Room Air SpO2: 99 % O2 Flow Rate (L/min): 0 L/min FiO2 (%): (!) 0 %  Filed Weights   08/31/22 0609 09/02/22 0355 09/03/22 0400  Weight: 84.5 kg 84.6 kg 85.7 kg    Intake/Output Summary (Last 24 hours) at 09/03/2022 1037 Last data filed at 09/03/2022 1000 Gross per 24 hour  Intake 380 ml  Output 2050 ml  Net -1670 ml   Net IO Since Admission: -5,910 mL [09/03/22 1037]     Latest Ref Rng & Units 09/03/2022    6:23 AM 09/02/2022   12:46 AM 09/01/2022   12:29 AM  CBC  WBC 4.0 - 10.5 K/uL 2.7  2.6  2.6   Hemoglobin 12.0 - 15.0 g/dL 7.8   7.3  7.1   Hematocrit 36.0 - 46.0 % 25.7  24.3  23.3   Platelets 150 - 400 K/uL 290  287  283        Latest Ref Rng & Units 09/03/2022    6:23 AM 09/02/2022    3:48 PM 09/02/2022   12:46 AM  CMP  Glucose 70 - 99 mg/dL 96  244  98   BUN 8 - 23 mg/dL 74  72  70   Creatinine 0.44 - 1.00 mg/dL 0.10  2.72  5.36   Sodium 135 - 145 mmol/L 139  139  139   Potassium 3.5 - 5.1 mmol/L 3.8  3.9  4.0   Chloride 98 - 111 mmol/L 107  107  110   CO2 22 - 32 mmol/L 20  18  21    Calcium 8.9 - 10.3 mg/dL 6.4  6.5  6.3    Corrected Calcium 8.1  No new imaging  Physical Exam: Physical Exam  Constitutional: Resting in bed speaking in full sentences.  CV: RRR, systolic murmur, no rubs or gallops, intact b/l upper pulses, JVD to mandible, pitting edema up to thigh and hands.  Pulmonary/Respiratory: Wheezing and ronchi on auscultation bilaterally; patient observed as having increased respiratory effort after having to sit up for the exam, but patient denied acute distress  Abdominal:  Soft, non-tender, positive bowel sounds.  Neuro:  Alert and oriented, asking and answering questions appropriately.  Psych: Normal mood and affect.  Skin: Warm and dry.   Patient Lines/Drains/Airways Status     Active Line/Drains/Airways     Name Placement date Placement time Site Days   Peripheral IV 08/30/22 22 G 1.75" Right Antecubital 08/30/22  1517  Antecubital  3   Urethral Catheter Monica Sixtos RN Straight-tip 14 Fr. 09/02/22  1029  Straight-tip  less than 1   Pressure Injury 08/30/22 Sacrum Mid 08/30/22  1430  -- 3   Wound / Incision (Open or Dehisced) 08/30/22 Incision - Open;Skin tear Pelvis Anterior;Right 08/30/22  1430  Pelvis  3            ASSESSMENT/PLAN:  Assessment: Principal Problem:   Acute diastolic congestive heart failure (HCC) Active Problems:   Essential hypertension   Acute kidney injury superimposed on CKD (HCC)   Severe aortic stenosis   UTI (urinary tract infection)    Hypocalcemia  Acute on Chronic Diastolic Heart Failure Severe AS, non rheumatic  Severe LVH, ?Cardiac amyloid History of 2:1 AV block  Repeat TTE this admission with LVEF of 60-65% with normal LV function, similar to 10/2021 echo. Aortic valve area 0.62, mean gradient of 50 up 29.8 from September 2023 echo. Per chart review, TAVR not an option due to comorbidities. Patient follows with cardiology outpatient (Dr. Excell Seltzer). Consulted cardiology 7/15, started following patient 7/16. Appreciate recommendations.   PT evaluation: recommended short-term inpatient rehabilitation but patient and husband expressed they would rather discharge home. PT will progress mobility as tolerated and will continue to follow.   Etiology of acute HF likely in the setting of inadequate diuretics dose. Per patient, cough and breathing improving. Na, K, and Cl WNL. Urine output on IV lasix 80 mg TID 2300 ccs, up from 2000 ccs on BID. Weight change increasing since admission, from 83 to 85.7 kg. Patient is still volume overloaded. Room to increase diuretics dose but team would like cardiology input for management given her severe aortic stenosis and worsening mean gradient compared to previous echo. Will consider nephrology consult if output after discussing with cardiology.   Plan:  - Continue IV lasix 80 mg TID - Telemetry  - Daily weights - Monitor I/Os + weights daily - Renal diet + feeding supplement  - Consult RD for feeding supplement guidance given lactose intolerance - Incentive spirometer and every 4 bronchodilator nebulized treatments - Monitor electrolytes daily    AKI on CKD IV Current presentation concerning for progression of CKD vs congestion in setting of acute HF vs AKI in setting of intravascular depletion 2/2 anasarca vs UTI (see below). No improvement in Cr or GFR on BID 80 mg lasix. Nephrology reviewing prior notes from Dr. Signe Colt from [facility]. Will consider nephrology consult following cardiology  consult.   Plan:  - Monitor RFP - Avoid nephrotoxic medications - Renal diet and fluid restriction - F/u on RD recommendations  Small to Moderate Pericardial Effusion Large Left  Mentioned on repeat echo; suspect this is due to volume overload in the setting of acute on chronic CHF and advanced CKD V.   Acute on chronic normocytic anemia Hgb today 7.8 from 7.3 yesterday. Chronic anemia likely in setting of chronic disease/stage IV CKD. Patient has history of hemorrhoids and experiences intermittent straining. No overt signs of bleeding overnight. Last colonoscopy in 2008. Given history of severe AS, question possible AVMs. Will continue to monitor at this time. -Monitor  CBC -Transfuse for Hgb less than 7   Hypocalcemia Mild vitamin D deficienty Currently awaiting results from work up sent from admission, including intact PTH. Patient's corrected Ca 8.1 today. Now on TID Ca carbonate supplementation as she continues to be diuresed today and daily vitamin D. Suspect her hypocalcemia is setting of CKD and Denosumab therapy. Discussed with Dr. Arlean Hopping on 7/14. No overt telemetry changes. Patient remains asymptomatic without major changes on telemetry. Receiving calcium carbonate TID.  Plan:  - Continue calcium carbonate TID - Vitamin D 1000 international units  daily - F/u PTH - still in process as of 7/16   Pressure wounds Pictures above and on media tab.  -Wound care consulted, made recommendations (refer to note), will not be following patient    Nonbleeding external hemorrhoids with mucosal prolapse Constipation Patient denied pain today. Received a one-time dose of sorbitol on 7/14. Plan: -Scheduled MiraLAX daily and senna S2 tabs twice daily -Monitor for signs of bleeding and discomfort   UTI, resolved Diagnosed outpatient with improvement after starting Keflex therapy on 7/9. Patient completed 5 days of UTI therapy with Cephalexin 500 mg BID on 7/14. Patient denied dysuria  today.  Plan:  - Monitor  Subclinical hypothyroidism Elevated TSH 6.844 (April TSH 4.009), Free T4 0.81, likely in the setting of acute illness -Outpatient follow up  Chronic health conditions Seizure disorder- continue phenobarbital 129.6 mg, zonisamide/zonegran 200 mg daily; normal phenobarbital levels Chronic pain/osteoarthritis- Tylenol 650 q6h PRN  Asthma- albuterol PRN  CVA- aspirin 81 daily  GERD pantoprazole 40 mg CAD- rosuvastatin 40 mg Osteoporosis- denosumab injection every 6 months DISCONTINUE, patient intolerance  Best Practice: Diet: Renal diet VTE: enoxaparin (LOVENOX) injection 30 mg Start: 08/30/22 1515 Code: FULL  Prior to Admission Living Arrangement: Home with spouse Anticipated Discharge Location: Pending PT evaluation Barriers to Discharge: Medical management.  Dispo: Anticipated discharge in approximately 1-2 day(s).   Signature: Amaru Burroughs Colbert Coyer, MD Internal Medicine Resident, PGY-1 [facility]  Pager: @MYPAGER @ 10:37 AM, 09/03/2022   Please contact the on call pager after 5 pm and on weekends at [phone].

## 2022-09-04 DIAGNOSIS — J90 Pleural effusion, not elsewhere classified: Secondary | ICD-10-CM | POA: Diagnosis not present

## 2022-09-04 DIAGNOSIS — N184 Chronic kidney disease, stage 4 (severe): Secondary | ICD-10-CM | POA: Diagnosis not present

## 2022-09-04 DIAGNOSIS — N179 Acute kidney failure, unspecified: Secondary | ICD-10-CM | POA: Diagnosis not present

## 2022-09-04 DIAGNOSIS — I5031 Acute diastolic (congestive) heart failure: Secondary | ICD-10-CM | POA: Diagnosis not present

## 2022-09-04 DIAGNOSIS — I5033 Acute on chronic diastolic (congestive) heart failure: Secondary | ICD-10-CM | POA: Diagnosis not present

## 2022-09-04 LAB — RENAL FUNCTION PANEL
Albumin: 1.8 g/dL — ABNORMAL LOW (ref 3.5–5.0)
Albumin: 1.9 g/dL — ABNORMAL LOW (ref 3.5–5.0)
Anion gap: 9 (ref 5–15)
Anion gap: 10 (ref 5–15)
BUN: 76 mg/dL — ABNORMAL HIGH (ref 8–23)
BUN: 76 mg/dL — ABNORMAL HIGH (ref 8–23)
CO2: 21 mmol/L — ABNORMAL LOW (ref 22–32)
CO2: 21 mmol/L — ABNORMAL LOW (ref 22–32)
Calcium: 6.1 mg/dL — CL (ref 8.9–10.3)
Calcium: 6.1 mg/dL — CL (ref 8.9–10.3)
Chloride: 107 mmol/L (ref 98–111)
Chloride: 104 mmol/L (ref 98–111)
Creatinine, Ser: 3.19 mg/dL — ABNORMAL HIGH (ref 0.44–1.00)
Creatinine, Ser: 3.42 mg/dL — ABNORMAL HIGH (ref 0.44–1.00)
GFR, Estimated: 15 mL/min — ABNORMAL LOW (ref >60)
GFR, Estimated: 13 mL/min — ABNORMAL LOW (ref >60)
Glucose, Bld: 97 mg/dL (ref 70–99)
Glucose, Bld: 160 mg/dL — ABNORMAL HIGH (ref 70–99)
Phosphorus: 4.9 mg/dL — ABNORMAL HIGH (ref 2.5–4.6)
Phosphorus: 5.1 mg/dL — ABNORMAL HIGH (ref 2.5–4.6)
Potassium: 3.9 mmol/L (ref 3.5–5.1)
Potassium: 4.4 mmol/L (ref 3.5–5.1)
Sodium: 137 mmol/L (ref 135–145)
Sodium: 135 mmol/L (ref 135–145)

## 2022-09-04 LAB — CBC
HCT: 24.5 % — ABNORMAL LOW (ref 36.0–46.0)
Hemoglobin: 7.5 g/dL — ABNORMAL LOW (ref 12.0–15.0)
MCH: 27.8 pg (ref 26.0–34.0)
MCHC: 30.6 g/dL (ref 30.0–36.0)
MCV: 90.7 fL (ref 80.0–100.0)
Platelets: 279 10*3/uL (ref 150–400)
RBC: 2.7 MIL/uL — ABNORMAL LOW (ref 3.87–5.11)
RDW: 14.9 % (ref 11.5–15.5)
WBC: 2.8 10*3/uL — ABNORMAL LOW (ref 4.0–10.5)
nRBC: 0 % (ref 0.0–0.2)

## 2022-09-04 LAB — CALCIUM / CREATININE RATIO, URINE
Calcium, Ur: 1 mg/dL
Calcium/Creat.Ratio: 34 mg/g creat (ref 29–442)
Creatinine, Urine: 29.6 mg/dL

## 2022-09-04 LAB — GLUCOSE, CAPILLARY: Glucose-Capillary: 90 mg/dL (ref 70–99)

## 2022-09-04 LAB — MAGNESIUM, URINE: Magnesium, urine: 3.7 mg/dL

## 2022-09-04 MED ORDER — METOLAZONE 2.5 MG PO TABS
2.5000 mg | ORAL_TABLET | Freq: Once | ORAL | Status: AC
  Administered 2022-09-04: 2.5 mg via ORAL
  Filled 2022-09-04: qty 1

## 2022-09-04 MED ORDER — POTASSIUM CHLORIDE CRYS ER 20 MEQ PO TBCR
40.0000 meq | EXTENDED_RELEASE_TABLET | Freq: Once | ORAL | Status: AC
  Administered 2022-09-04: 40 meq via ORAL
  Filled 2022-09-04: qty 2

## 2022-09-04 NOTE — Progress Notes (Addendum)
Advanced Heart Failure Rounding Note  PCP-Cardiologist: Tonny Bollman, MD   Subjective:     Stable today with significant peripheral and pulmonary edema. 1.8 L net negative. 2.4 L output. Continues on lasix 12 mg/h. Renal function stable 3.3-3.2.  Discussed high risk of complications with TAVR. Open to palliative care consult but wants to pursue available options and would be ok with dialysis/TAVR/pacemaker at this point in time.    Objective:   Weight Range: 84.4 kg Body mass index is 35.16 kg/m.   Vital Signs:   Temp:  [97.9 F (36.6 C)-99.4 F (37.4 C)] 99.4 F (37.4 C) (07/17 0356) Pulse Rate:  [84-92] 85 (07/17 0356) Resp:  [15-20] 17 (07/17 0356) BP: (126-160)/(63-91) 137/78 (07/17 0356) SpO2:  [100 %] 100 % (07/17 0356) Weight:  [84.4 kg] 84.4 kg (07/17 0356) Last BM Date : 09/03/22  Weight change: Filed Weights   09/02/22 0355 09/03/22 0400 09/04/22 0356  Weight: 84.6 kg 85.7 kg 84.4 kg    Intake/Output:   Intake/Output Summary (Last 24 hours) at 09/04/2022 0841 Last data filed at 09/04/2022 0600 Gross per 24 hour  Intake 622.25 ml  Output 2475 ml  Net -1852.75 ml      Physical Exam    General:  Well appearing. No resp difficulty Neck: supple. JVD present .  Cor: RRR with PVCs. Loud systolic murmur over RUSB. Lungs: crackles and wheezing throughout posterior lung fields  Abdomen: soft, nontender, nondistended. Extremities: stable 2+ pitting edema in the upper and lower extremities bilaterally Neuro: alert & orientedx3   Telemetry   1st degree AV block with frequent PVCs   EKG    08/30/2022 SR w/ 2nd degree AV block type 1 08/31/2022 SR w/ 1st degree AV block and PVCs  Labs    CBC Recent Labs    09/03/22 0623 09/04/22 0455  WBC 2.7* 2.8*  HGB 7.8* 7.5*  HCT 25.7* 24.5*  MCV 91.8 90.7  PLT 290 279   Basic Metabolic Panel Recent Labs    29/56/21 0046 09/02/22 1548 09/03/22 0623 09/04/22 0455  NA 139   < > 139 137  K 4.0    < > 3.8 3.9  CL 110   < > 107 107  CO2 21*   < > 20* 21*  GLUCOSE 98   < > 96 97  BUN 70*   < > 74* 76*  CREATININE 3.58*   < > 3.33* 3.19*  CALCIUM 6.3*   < > 6.4* 6.1*  MG 2.0  --   --   --   PHOS 4.9*   < > 5.0* 4.9*   < > = values in this interval not displayed.   Liver Function Tests Recent Labs    09/03/22 0623 09/04/22 0455  ALBUMIN 1.9* 1.8*    BNP: BNP (last 3 results) Recent Labs    06/14/22 1152 08/27/22 1200 08/30/22 1130  BNP 711.2* 2,010.8* 2,133.4*     Medications:     Scheduled Medications:  aspirin  81 mg Oral Daily   calcium carbonate  1,000 mg of elemental calcium Oral TID WC   Chlorhexidine Gluconate Cloth  6 each Topical Daily   cholecalciferol  1,000 Units Oral Daily   enoxaparin (LOVENOX) injection  30 mg Subcutaneous Q24H   hydrALAZINE  50 mg Oral Q8H   lactose free nutrition  237 mL Oral BID BM   multivitamin  1 tablet Oral QHS   pantoprazole  40 mg Oral Daily   PHENobarbital  129.6 mg Oral QHS   polyethylene glycol  17 g Oral Daily   rosuvastatin  40 mg Oral Daily   senna-docusate  2 tablet Oral BID   zonisamide  200 mg Oral QHS    Infusions:  furosemide (LASIX) 200 mg in dextrose 5 % 100 mL (2 mg/mL) infusion 12 mg/hr (09/04/22 0653)    PRN Medications: acetaminophen **OR** acetaminophen, albuterol    Patient Profile   77 y/o female w/ PMH of severe AS, HFpEF, CAD, HTN, CKD stage 4, and hx of AV block who presented with volume overload, bilateral pleural effusions, and AKI on CKD 4.   Assessment/Plan    Acute on Chronic Diastolic Heart Failure   Extensive workup most consistent with hypertensive/obstructive/ischemic cardiomyopathy  Cardiac MRI was concerning for cardiac amyloidosis (11/19), as was echo with moderate LVH and aortic stenosis. The aortic stenosis and the pericardial effusion seen by echo are also frequently seen with cardiac amyloidosis. She additionally has tingling/numbness in hands/feet that is  consistent with peripheral neuropathy. However, PYP scan in 11/19 was negative and myeloma workup was negative. Repeat PYP scan in 11/22 was again negative and she did not have a transthyretin gene mutation on [employer] gene testing. Myeloma workup was again negative in 10/22. Therefore, cardiac amyloidosis seems unlikely and LVH may be due to HTN and aortic stenosis though delayed enhancement from MRI is more suggestive of infiltrative disease.  Stable Echo here with worsening aortic stenosis (described below) Renal function and AV block limiting GDMT, avoid ACE/ARB/ARNI/BB On Lasix 60 mg BID and Jardiance 10 mg daily at home Exacerbation likely 2/2 inadequate oral diuretic dose with progressive renal disease and worsening aortic stenosis Diuresing well but still has significant edema. 2 L out last 24 hours on Lasix 80 mg IV TID Jardiance not restarted, possible UTI on admission, could resume during admission if renal function stabilizes or improves, continue to hold today Start lasix drip 12 mg/hr, strict in/out, continue foley, daily weights, daily RFP Severe Aortic Stenosis Worsening of AS, previously paradoxical low flow/gradient AS but TTE here shows gradient of 50 mmHg, area of 0.62 cm2, and Vmax of 4.56 m/s. 12/21 LHC/RHC and echo were suggestive of moderate aortic stenosis.  Repeat echo in 11/22 showed paradoxical low flow/low gradient severe aortic stenosis, echo in 9/23 was similar.  She was seen by Dr. Excell Seltzer for TAVR evaluation, last seen 04/2022.  At that time they decided not to move forward with TAVR because no symptom burden, minimal activity, and comorbidities including CKD stage IV and AV block with high likelihood of of post TAVR pacemaker requirement. Still minimally active but having symptoms with activity such as sweeping the floor in her powered chair Could be contributing to heart failure exacerbation and AKI/progressive CKD Discussed with Dr. Excell Seltzer, still a very high risk  procedure for this patient but without it she will continue to decline. He would recommend palliative approach to care but would consider TAVR if desired by the patient. Patient counseled on risks. Wants to pursue TAVR eval and is ok with pacemaker and dialysis at this point. Agreeable to palliative care consult as well Consult structural heart team to eval AKI on CKD stage IV Cr peak 3.6 (stable today 3.3->3.2) with baseline 2.8 Followed by [facility] outpatient Good urine output on lasix drip, continue to monitor RFP daily Avoid nephrotoxic agents as above 1st/2nd degree AV block Long history of 1st degree AV block with PR interval 392 ms here, admission EKG with 2nd degree type  1 block Increased risk of post TAVR AV block needing pacemaker placement Continue to avoid BB and other AV nodal blocking agents CAD LHC (4/15) with 80% and 50% tandem stenoses in the proximal LAD, 80% mid LCx, 85% dLCx, diffuse mid-distal disease up to 90% in RCA. Diffuse coronary disease, treated medically  No evidence of ACS on admission or follow up EKG, no chest pain Would need pre-op LHC/RHC for TAVR  Continue aspirin and statin HTN Home medications of hydralazine 100 mg tid and amlodipine which was discontinued outpatient  Hydralazine and amlodipine held on admission BP improved after starting 1/2 home hydralazine Continue hydralazine at 50 mg tid, increase as needed GOC Goals of care discussions ongoing and patient is agreeable to palliative consult. Overall worsening AS causing heart failure exacerbations, progressive renal disease, and high risk patient for TAVR. Counseled on risks of renal failure and dialysis, heart block and pacemaker, and possible inability to tolerate these procedures.    Length of Stay: 4  Rocky Morel, DO  09/04/2022, 8:41 AM  Advanced Heart Failure Team Pager [phone] (M-F; 7a - 5p)  Please contact [facility] for night-coverage after hours (5p -7a ) and weekends on  [email or web address]   Patient seen with resident, agree with the above note.   She diuresed with Lasix gtt 12 mg/hr yesterday, I/Os net negative 1853.  Not sure of accuracy of weights (bed weights).  Creatinine 3.33 => 3.19.   General: NAD Neck: JVP 12 cm, no thyromegaly or thyroid nodule.  Lungs: Clear to auscultation bilaterally with normal respiratory effort. CV: Nondisplaced PMI.  Heart regular S1/S2, no S3/S4, 3/6 SEM RUSB. 1+ edema to thighs.  Abdomen: Soft, nontender, no hepatosplenomegaly, no distention.  Skin: Intact without lesions or rashes.  Neurologic: Alert and oriented x 3.  Psych: Normal affect. Extremities: No clubbing or cyanosis.  HEENT: Normal.   1. Aortic stenosis: 12/21 LHC/RHC and echo were suggestive of moderate aortic stenosis.  Repeat echo in 11/22 showed paradoxical low flow/low gradient severe aortic stenosis, echo in 9/23 was similar.  She was seen by Dr. Excell Seltzer for TAVR evaluation.  She is very limited in terms of activity, basically wheel-chair bound at baseline. She has a long 1st degree AVB and has episodes of type 1 2nd degree AVB.  She also has CKD stage IV.  TAVR could be done, but high risk for progression to ESRD and need for pacemaker.  She would have a hard time with HD due to lack of mobility.  Echo this admission with severe AS, mean gradient worsened to 50 mmHg.  She is more symptomatic.  - I think she would be a marginal TAVR candidate.  Will plan to diurese her as much as we can this admission then have her followup with Dr. Excell Seltzer as an outpatient to discuss TAVR.  2. CAD: Moderate-severe diffuse CAD on coronary angiography in 4/15. She would not be a candidate for CABG due to lack of mobility. She has occasional chest discomfort with activities but this is not prominent.   - Continue ASA 81.  - Continue Crestor. - If we were to pursue TAVR, would need coronary angiography.  This would be difficult with CKD stage IV.  3. AKI on CKD stage IV: Baseline  creatinine around 2.5+, up to 3.35 this admission, down to 3.19 today.   - Follow closely with diuresis.  4. Acute on chronic diastolic CHF: Cardiac MRI was concerning for cardiac amyloidosis (11/19), as was echo with moderate LVH and aortic  stenosis. The aortic stenosis and the pericardial effusion seen by echo are also frequently seen with cardiac amyloidosis. She additionally has tingling/numbness in hands/feet that is consistent with peripheral neuropathy.  However, PYP scan in 11/19 was negative and myeloma workup was negative.  Repeat PYP scan in 11/22 was again negative and she did not have a transthyretin gene mutation on [employer] gene testing.  Myeloma workup was again negative in 10/22.  Therefore, cardiac amyloidosis seems unlikely and LVH may be due to HTN and aortic stenosis though delayed enhancement from MRI is more suggestive of infiltrative disease. Echo this admission shows EF 60-65%, severe LVH, normal RV, small to moderate pericardial effusion (unchanged), severe AS with mean gradient 50 mmHg (AS has been severe in past but mean gradient has progressed).  She reports dyspnea x 2 wks and has significant volume overload on exam, complicated by CKD stage IV.  I suspect that her CHF is driven by severe AS.  She diuresed yesterday on Lasix 12 mg/hr, still volume overloaded.  - Continue Lasix 12 mg/hr, will give 1 dose of metolazone 2.5 mg and will replace K.  - No ARB/ARNi or spiro with CKD - Hold Jardiance for now until creatinine stabilizes.  5. HTN: BP controlled on hydralazine 50 mg tid.  6. Conduction abnormality: Long 1st degree AVB, also runs of type 1 2nd degree AVB have been noted.   - Avoid beta blockade.  - High risk for progressive HB with TAVR, would likely end up with PPM.  Marca Ancona 09/04/2022 1:40 PM

## 2022-09-04 NOTE — Progress Notes (Signed)
Date and time results received: 09/04/22 0753 (use smartphrase ".now" to insert current time)  Test: calcium  Critical Value: 6.1  Name of Provider Notified: August Saucer   Orders Received? Or Actions Taken?:  none

## 2022-09-04 NOTE — Progress Notes (Signed)
HD#4 SUBJECTIVE:  Patient Summary: Sherry [name] is a 77 y.o. with a pertinent PMH of HFpEF (60-65% EF), severe AS, mild MR, and CKD 4 who presented for volume overload and directly admitted from [facility] on 7/12 for acute heart failure, AKI on CKD, UTI, and electrolyte disturbances including hypocalcemia.   Overnight Events:  Overnight team notified for calcium 6.1, corrected calcium of 7.9. Patient states she is feeling pretty good this morning. Denied chest pain, shortness of breath, palpitations. Patient complains of knee pain and stiffness that has been present for a long time. Patient denies any pelvic pain with the catheter. Endorsed bloating/gas improved on changes made by RD.   Discussed patient's understanding of discussion with cardiology regarding TAVR, its implications, and alternatives to treatment including no further interventions. Patient and husband with good insight regarding treatment options. They will continue to discuss as a family.   Interim History:   OBJECTIVE:  Vital Signs: Vitals:   09/03/22 1650 09/03/22 1927 09/03/22 2331 09/04/22 0356  BP: 126/63 139/79 (!) 143/85 137/78  Pulse: 88 92 88 85  Resp: 16 20 16 17   Temp: 97.9 F (36.6 C) 99 F (37.2 C) 98.6 F (37 C) 99.4 F (37.4 C)  TempSrc: Oral Oral Oral Axillary  SpO2: 100% 100% 100% 100%  Weight:    84.4 kg   Supplemental O2: Room Air SpO2: 100 % O2 Flow Rate (L/min): 0 L/min FiO2 (%): (!) 0 %  Filed Weights   09/02/22 0355 09/03/22 0400 09/04/22 0356  Weight: 84.6 kg 85.7 kg 84.4 kg    Intake/Output Summary (Last 24 hours) at 09/04/2022 0801 Last data filed at 09/04/2022 0600 Gross per 24 hour  Intake 622.25 ml  Output 2475 ml  Net -1852.75 ml   Net IO Since Admission: -7,942.75 mL [09/04/22 0801]     Latest Ref Rng & Units 09/04/2022    4:55 AM 09/03/2022    6:23 AM 09/02/2022   12:46 AM  CBC  WBC 4.0 - 10.5 K/uL 2.8  2.7  2.6   Hemoglobin 12.0 - 15.0 g/dL 7.5  7.8  7.3    Hematocrit 36.0 - 46.0 % 24.5  25.7  24.3   Platelets 150 - 400 K/uL 279  290  287        Latest Ref Rng & Units 09/04/2022    4:55 AM 09/03/2022    6:23 AM 09/02/2022    3:48 PM  CMP  Glucose 70 - 99 mg/dL 97  96  161   BUN 8 - 23 mg/dL 76  74  72   Creatinine 0.44 - 1.00 mg/dL 0.96  0.45  4.09   Sodium 135 - 145 mmol/L 137  139  139   Potassium 3.5 - 5.1 mmol/L 3.9  3.8  3.9   Chloride 98 - 111 mmol/L 107  107  107   CO2 22 - 32 mmol/L 21  20  18    Calcium 8.9 - 10.3 mg/dL 6.1  6.4  6.5    Corrected Calcium 7.9  No new imaging  Physical Exam: Physical Exam  Constitutional: Resting in bed with increased work of breathing while talking.  CV: RRR, systolic murmur loudest over right upper sternal border, JVD still present, unchanged pitting edema b/l upper and lower extremities.  Pulmonary/Respiratory: Diffuse crackles along posterior lower lungs, wheezes on auscultation Abdominal: Soft, non-tender, non-distended on palpation.  Neuro: Alert and oriented to self, place, and situation. No tremor observed on right upper extremity.  Psych:  Normal mood and affect.  Skin: Warm and dry.   Patient Lines/Drains/Airways Status     Active Line/Drains/Airways     Name Placement date Placement time Site Days   Peripheral IV 08/30/22 22 G 1.75" Right Antecubital 08/30/22  1517  Antecubital  3   Urethral Catheter Monica Sixtos RN Straight-tip 14 Fr. 09/02/22  1029  Straight-tip  less than 1   Pressure Injury 08/30/22 Sacrum Mid 08/30/22  1430  -- 3   Wound / Incision (Open or Dehisced) 08/30/22 Incision - Open;Skin tear Pelvis Anterior;Right 08/30/22  1430  Pelvis  3            ASSESSMENT/PLAN:  Assessment: Principal Problem:   Acute diastolic congestive heart failure (HCC) Active Problems:   Essential hypertension   Acute kidney injury superimposed on CKD (HCC)   Severe aortic stenosis   UTI (urinary tract infection)   Hypocalcemia  Acute on Chronic Diastolic Heart  Failure Severe AS, non rheumatic  Severe LVH, ?Cardiac amyloid History of 2:1 AV block  Repeat TTE this admission with LVEF of 60-65% with normal LV function, similar to 10/2021 echo. Aortic valve area 0.62, mean gradient of 50 up 29.8 from September 2023 echo. Per chart review, TAVR not an option due to comorbidities. Patient follows with cardiology outpatient (Dr. Excell Seltzer). Consulted cardiology 7/15, started following patient 7/16. Dr. Shirlee Latch with cardiology counseled patient and husband on risks associated with TAVR including renal failure and dialysis, heart block and pacemaker, and possible inability to tolerate these procedures.    PT evaluation: recommended short-term inpatient rehabilitation but patient and husband expressed they would rather discharge home. PT will progress mobility as tolerated and will continue to follow.   Etiology of acute HF likely due to inadequate oral diuretic dose, progressive CKD and worsening aortic stenosis. Urine output on IV lasix drip 2475 ccs, weight change of -1.3 kg. Patient is still volume overloaded. Will consider nephrology consult if output decreases, or kidney function worsens.   RD provided nutritional recommendations, changed supplement to BOOST to accommodate lactose intolerance.   Plan:  - IV lasix drip 12 mg/hr - Continue to hold Jardiance - Telemetry  - Daily weights - Monitor I/Os + weights daily - Renal diet + feeding supplement  - Incentive spirometer and every 4 bronchodilator nebulized treatments - Monitor electrolytes daily    AKI on CKD IV Current presentation concerning for progression of CKD vs congestion in setting of acute HF vs AKI in setting of intravascular depletion 2/2 anasarca vs UTI (see below). No improvement in Cr or GFR on BID 80 mg lasix. Nephrology reviewing prior notes from Dr. Signe Colt from [facility]. Will consider nephrology consult following cardiology consult.   Plan:  - Monitor RFP - Avoid nephrotoxic medications -  Renal diet and fluid restriction - F/u on RD recommendations  Small to Moderate Pericardial Effusion Large Left  Mentioned on repeat echo; suspect this is due to volume overload in the setting of acute on chronic CHF and advanced CKD V.   Acute on chronic normocytic anemia Hgb today 7.8 from 7.3 yesterday. Chronic anemia likely in setting of chronic disease/stage IV CKD. Patient has history of hemorrhoids and experiences intermittent straining. No overt signs of bleeding overnight. Last colonoscopy in 2008. Given history of severe AS, question possible AVMs. Will continue to monitor at this time. -Monitor CBC -Transfuse for Hgb less than 7   Hypocalcemia Mild vitamin D deficienty Patient's corrected Ca 7.9 today. Now on TID Ca carbonate supplementation as  she continues to be diuresed today and daily vitamin D. Suspect her hypocalcemia is setting of CKD and Denosumab therapy. Discussed with Dr. Arlean Hopping on 7/14. No overt telemetry changes. Patient remains asymptomatic without major changes on telemetry. Receiving calcium carbonate TID.  Plan:  - F/u 1700 RFP for Ca  - Continue calcium carbonate TID - Vitamin D 1000 international units  daily - F/u PTH - still in process as of 7/16   Pressure wounds Pictures above and on media tab.  -Wound care consulted, made recommendations (refer to note), will not be following patient    Nonbleeding external hemorrhoids with mucosal prolapse Constipation Patient denied pain today. Received a one-time dose of sorbitol on 7/14. Plan: -Scheduled MiraLAX daily and senna S2 tabs twice daily -Monitor for signs of bleeding and discomfort   UTI, resolved Diagnosed outpatient with improvement after starting Keflex therapy on 7/9. Patient completed 5 days of UTI therapy with Cephalexin 500 mg BID on 7/14. Patient denied dysuria today.  Plan:  - Monitor  Subclinical hypothyroidism Elevated TSH 6.844 (April TSH 4.009), Free T4 0.81, likely in the setting  of acute illness -Outpatient follow up  Chronic health conditions Seizure disorder- continue phenobarbital 129.6 mg, zonisamide/zonegran 200 mg daily; normal phenobarbital levels Chronic pain/osteoarthritis- Tylenol 650 q6h PRN  Asthma- albuterol PRN  CVA- aspirin 81 daily  GERD pantoprazole 40 mg CAD- rosuvastatin 40 mg Osteoporosis- denosumab injection every 6 months DISCONTINUE, patient intolerance  Best Practice: Diet: Renal diet VTE: enoxaparin (LOVENOX) injection 30 mg Start: 08/30/22 1515 Code: FULL  Prior to Admission Living Arrangement: Home with spouse Anticipated Discharge Location: Pending PT evaluation Barriers to Discharge: Medical management.  Dispo: Anticipated discharge in approximately 1-2 day(s).   Signature: Yasuo Phimmasone Colbert Coyer, MD Internal Medicine Resident, PGY-1 [facility]  Pager: @[email or web address] @ 8:01 AM, 09/04/2022   Please contact the on call pager after 5 pm and on weekends at [phone].

## 2022-09-04 NOTE — Plan of Care (Signed)
  Problem: Clinical Measurements: Goal: Will remain free from infection Outcome: Not Applicable

## 2022-09-04 NOTE — Progress Notes (Signed)
Internal Medicine Clinic Attending  I was physically present during the key portions of the resident provided service and participated in the medical decision making of patient's management care. I reviewed pertinent patient test results.  The assessment, diagnosis, and plan were formulated together and I agree with the documentation in the resident's note.  Lau, Grace, MD  

## 2022-09-04 NOTE — TOC Initial Note (Addendum)
Transition of Care [facility]) - Initial/Assessment Note    Patient Details  Name: Sherry [name] MRN: [id number] Date of Birth: 09-26-1945  Transition of Care [facility]) CM/SW Contact:    Elliot Cousin, RN Phone Number: [phone] 09/04/2022, 3:55 PM  Clinical Narrative:      HF TOC CM spoke to pt and husband. Pt prefers dc home with HH. Provided pt with medicare.gov list with rating for HH. Agreeable to [facility] for HH. Contacted rep, Kelly with new referral. Will need HH orders with [facility]. Sent message to attending. Contacted [employer] rep, Jermaine for [employer] lift for home. Pt states she uses [facility] and will need to notify them one day prior for transportation home. She has her scooter in room.    PCP appt arranged for 09/17/2022 and on AVS            Expected Discharge Plan: Home w Home Health Services Barriers to Discharge: Continued Medical Work up   Patient Goals and CMS Choice Patient states their goals for this hospitalization and ongoing recovery are:: wants to go home CMS Medicare.gov Compare Post Acute Care list provided to:: Patient Choice offered to / list presented to : Patient      Expected Discharge Plan and Services   Discharge Planning Services: CM Consult Post Acute Care Choice: Home Health Living arrangements for the past 2 months: Single Family Home                 DME Arranged: Other see comment DME Agency: [employer] Date DME Agency Contacted: 09/04/22 Time DME Agency Contacted: [phone] Representative spoke with at DME Agency: Shaune Leeks HH Arranged: RN, PT [facility] Agency: [facility] Date [facility] Agency Contacted: 09/04/22 Time HH Agency Contacted: [phone] Representative spoke with at [facility] Agency: Hassel Neth  Prior Living Arrangements/Services Living arrangements for the past 2 months: Single Family Home Lives with:: Spouse Patient language and need for interpreter reviewed:: Yes Do you feel safe going back to the place where you live?:  Yes      Need for Family Participation in Patient Care: Yes (Comment) Care giver support system in place?: Yes (comment) Current home services: DME (scooter, wheelchair, bedside commode) Criminal Activity/Legal Involvement Pertinent to Current Situation/Hospitalization: No - Comment as needed  Activities of Daily Living Home Assistive Devices/Equipment: Wheelchair ADL Screening (condition at time of admission) Patient's cognitive ability adequate to safely complete daily activities?: Yes Is the patient deaf or have difficulty hearing?: Yes Does the patient have difficulty seeing, even when wearing glasses/contacts?: Yes Does the patient have difficulty concentrating, remembering, or making decisions?: No Patient able to express need for assistance with ADLs?: Yes Does the patient have difficulty dressing or bathing?: Yes Independently performs ADLs?: No Communication: Independent Walks in Home: Dependent Does the patient have difficulty walking or climbing stairs?: Yes Weakness of Legs: Both Weakness of Arms/Hands: Both  Permission Sought/Granted Permission sought to share information with : Case Manager, Family Supports, PCP Permission granted to share information with : Yes, Verbal Permission Granted  Share Information with NAME: Ilham Roughton  Permission granted to share info w AGENCY: Home Health, DME  Permission granted to share info w Relationship: husband  Permission granted to share info w Contact Information: [phone]  Emotional Assessment Appearance:: Appears stated age Attitude/Demeanor/Rapport: Engaged Affect (typically observed): Appropriate Orientation: : Oriented to Self, Oriented to Place, Oriented to  Time, Oriented to Situation   Psych Involvement: No (comment)  Admission diagnosis:  Hypocalcemia [E83.51] Patient  Active Problem List   Diagnosis Date Noted   Acute diastolic congestive heart failure (HCC) 08/31/2022   Electrolyte abnormality 08/30/2022    Hypocalcemia 08/27/2022   Swelling of lower extremity 06/14/2022   UTI (urinary tract infection) 03/07/2022   Onychauxis 10/19/2021   Seizures (HCC)    Hyperlipidemia    GERD (gastroesophageal reflux disease)    Osteoporosis 10/11/2015   Coronary artery disease    1st degree AV block 03/05/2013   Severe aortic stenosis 02/18/2013   Chronic diastolic congestive heart failure (HCC) 11/11/2012   Multiple lacunar infarcts (HCC) 05/07/2012   Degenerative cervical disc 01/20/2012   Acute kidney injury superimposed on CKD (HCC) 04/28/2011   Abnormality of gait- basically wheelchair bound 07/31/2009   Essential hypertension 06/04/2006   Osteoarthritis of lumbar spine 06/04/2006   PCP:  Gwenevere Abbot, MD Pharmacy:   [id number] - [place], [place] - [address] AT [facility] [address] [place] [address] Phone: [phone] Fax: [phone]  [facility] - [place], [place] - [address] [place] [address] Phone: [phone] Fax: [phone]     Social Determinants of Health (SDOH) Social History: SDOH Screenings   Food Insecurity: No Food Insecurity (08/30/2022)  Housing: Patient Declined (08/30/2022)  Transportation Needs: No Transportation Needs (08/30/2022)  Utilities: Not At Risk (08/30/2022)  Depression (PHQ2-9): Low Risk  (06/14/2022)  Physical Activity: Inactive (10/17/2021)  Stress: No Stress Concern Present (10/17/2021)  Tobacco Use: Medium Risk (08/30/2022)   SDOH Interventions:     Readmission Risk Interventions     No data to display

## 2022-09-04 NOTE — Progress Notes (Signed)
Date and time results received: 09/04/22 0553 (use smartphrase ".now" to insert current time)  Test: calcium  Critical Value: 6.1  Name of Provider Notified: August Saucer  Orders Received? Or Actions Taken?:  none at this time

## 2022-09-04 NOTE — Progress Notes (Signed)
Physical Therapy Treatment Patient Details Name: Sherry [name] MRN: [id number] DOB: 06-23-1945 Today's Date: 09/04/2022   History of Present Illness 77 yo female presents to [facility] on 7/12 as dierct admit from internal medicine center with concern for acute HF exacerbation, UTI, AKI, electrolyte imbalance. PMH includes HTN, type I AV block, Aortic Stenosis, HLD,  diastolic CHF, CKD IV, osteoporosis, seizure disorder, and history of CVA.    PT Comments  Patient progressing to standing attempts this session.  Sitting balance with S when supported with R UE despite significant edema in that arm.  Had RN check it and she states had good blood return in IV, though noted more edema in R arm than other extremities so elevated end of session.  Spouse present and jumping in to help position her in supine.  Discussed options and seems to agree with SNF.  Patient will continue to benefit from skilled PT in the acute setting until d/c.      Assistance Recommended at Discharge Frequent or constant Supervision/Assistance  If plan is discharge home, recommend the following:  Can travel by private vehicle    Two people to help with walking and/or transfers;Two people to help with bathing/dressing/bathroom      Equipment Recommendations  Other (comment) (may need hoyer lift if d/c home)    Recommendations for Other Services       Precautions / Restrictions Precautions Precautions: Fall Precaution Comments: sacral wound (healing, mepilex on), bilat prevalon boots     Mobility  Bed Mobility Overal bed mobility: Needs Assistance Bed Mobility: Supine to Sit, Sit to Supine     Supine to sit: Mod assist, +2 for physical assistance Sit to supine: Max assist, +2 for physical assistance   General bed mobility comments: elevated HOB and assist for legs off EOB and scooting hips. to supine assist for legs onto bed and for positioning hips and trunk, spouse present and assisting     Transfers Overall transfer level: Needs assistance Equipment used: 2 person hand held assist Transfers: Sit to/from Stand Sit to Stand: Max assist, +2 physical assistance           General transfer comment: using bed pad under pt and A for L LE placement and stabilization on the floor; performed/attempted x 3. reports L LE was injured in the past and has not been able to stand in a while with leg twisted    Ambulation/Gait                   Stairs             Wheelchair Mobility     Tilt Bed    Modified Rankin (Stroke Patients Only)       Balance Overall balance assessment: Needs assistance   Sitting balance-Leahy Scale: Poor Sitting balance - Comments: using R UE to prop in sitting, placed hand in her lap for RN to check IV and needing min A for balance Postural control: Right lateral lean Standing balance support: Bilateral upper extremity supported Standing balance-Leahy Scale: Zero Standing balance comment: +2 for standing attempts                            Cognition Arousal/Alertness: Awake/alert Behavior During Therapy: WFL for tasks assessed/performed Overall Cognitive Status: History of cognitive impairments - at baseline  Exercises General Exercises - Lower Extremity Ankle Circles/Pumps: AROM, 5 reps, Both, Supine, 10 reps Heel Slides: AROM, AAROM, 10 reps, Both, Supine Other Exercises Other Exercises: pull ups with both hands on bed rails x 10 with HOB up about 30    General Comments General comments (skin integrity, edema, etc.): feet free from skin breakdown, VSS with mobility; spouse present and reports reasonable for pt to go to rehab, though he supported and assisted with mobility during session      Pertinent Vitals/Pain Pain Assessment Pain Assessment: Faces Faces Pain Scale: Hurts little more Pain Location: LE's L >R Pain Descriptors / Indicators:  Aching, Discomfort Pain Intervention(s): Monitored during session, Repositioned, Limited activity within patient's tolerance    Home Living                          Prior Function            PT Goals (current goals can now be found in the care plan section) Progress towards PT goals: Progressing toward goals    Frequency    Min 1X/week      PT Plan Current plan remains appropriate    Co-evaluation              AM-PAC PT "6 Clicks" Mobility   Outcome Measure  Help needed turning from your back to your side while in a flat bed without using bedrails?: A Lot Help needed moving from lying on your back to sitting on the side of a flat bed without using bedrails?: Total Help needed moving to and from a bed to a chair (including a wheelchair)?: Total Help needed standing up from a chair using your arms (e.g., wheelchair or bedside chair)?: Total Help needed to walk in hospital room?: Total Help needed climbing 3-5 steps with a railing? : Total 6 Click Score: 7    End of Session Equipment Utilized During Treatment: Gait belt Activity Tolerance: Patient tolerated treatment well Patient left: in bed;with call bell/phone within reach;with bed alarm set;with family/visitor present   PT Visit Diagnosis: Other abnormalities of gait and mobility (R26.89);Muscle weakness (generalized) (M62.81)     Time: 8413-2440 PT Time Calculation (min) (ACUTE ONLY): 25 min  Charges:    $Therapeutic Activity: 23-37 mins PT General Charges $$ ACUTE PT VISIT: 1 Visit                     Sheran Lawless, PT Acute Rehabilitation Services Office:[phone] 09/04/2022    Elray Mcgregor 09/04/2022, 2:03 PM

## 2022-09-04 NOTE — Addendum Note (Signed)
Addended by: Dickie La on: 09/04/2022 03:35 PM   Modules accepted: Level of Service

## 2022-09-05 DIAGNOSIS — N189 Chronic kidney disease, unspecified: Secondary | ICD-10-CM

## 2022-09-05 DIAGNOSIS — Z7189 Other specified counseling: Secondary | ICD-10-CM

## 2022-09-05 DIAGNOSIS — I5031 Acute diastolic (congestive) heart failure: Secondary | ICD-10-CM | POA: Diagnosis not present

## 2022-09-05 DIAGNOSIS — Z515 Encounter for palliative care: Secondary | ICD-10-CM | POA: Diagnosis not present

## 2022-09-05 DIAGNOSIS — I350 Nonrheumatic aortic (valve) stenosis: Secondary | ICD-10-CM

## 2022-09-05 DIAGNOSIS — N179 Acute kidney failure, unspecified: Secondary | ICD-10-CM | POA: Diagnosis not present

## 2022-09-05 LAB — CBC
HCT: 24.4 % — ABNORMAL LOW (ref 36.0–46.0)
Hemoglobin: 7.4 g/dL — ABNORMAL LOW (ref 12.0–15.0)
MCH: 27.5 pg (ref 26.0–34.0)
MCHC: 30.3 g/dL (ref 30.0–36.0)
MCV: 90.7 fL (ref 80.0–100.0)
Platelets: 289 10*3/uL (ref 150–400)
RBC: 2.69 MIL/uL — ABNORMAL LOW (ref 3.87–5.11)
RDW: 14.8 % (ref 11.5–15.5)
WBC: 2.5 10*3/uL — ABNORMAL LOW (ref 4.0–10.5)
nRBC: 0 % (ref 0.0–0.2)

## 2022-09-05 LAB — BASIC METABOLIC PANEL
Anion gap: 10 (ref 5–15)
BUN: 77 mg/dL — ABNORMAL HIGH (ref 8–23)
CO2: 22 mmol/L (ref 22–32)
Calcium: 6.1 mg/dL — CL (ref 8.9–10.3)
Chloride: 104 mmol/L (ref 98–111)
Creatinine, Ser: 3.25 mg/dL — ABNORMAL HIGH (ref 0.44–1.00)
GFR, Estimated: 14 mL/min — ABNORMAL LOW (ref >60)
Glucose, Bld: 89 mg/dL (ref 70–99)
Potassium: 4.3 mmol/L (ref 3.5–5.1)
Sodium: 136 mmol/L (ref 135–145)

## 2022-09-05 LAB — PTH, INTACT AND CALCIUM
Calcium, Total (PTH): 6.1 mg/dL (ref 8.7–10.3)
PTH: 185 pg/mL — ABNORMAL HIGH (ref 15–65)

## 2022-09-05 LAB — GLUCOSE, CAPILLARY
Glucose-Capillary: 87 mg/dL (ref 70–99)
Glucose-Capillary: 136 mg/dL — ABNORMAL HIGH (ref 70–99)
Glucose-Capillary: 147 mg/dL — ABNORMAL HIGH (ref 70–99)

## 2022-09-05 LAB — MAGNESIUM: Magnesium: 1.9 mg/dL (ref 1.7–2.4)

## 2022-09-05 MED ORDER — MAGNESIUM SULFATE 2 GM/50ML IV SOLN
2.0000 g | Freq: Once | INTRAVENOUS | Status: AC
  Administered 2022-09-05: 2 g via INTRAVENOUS
  Filled 2022-09-05: qty 50

## 2022-09-05 MED ORDER — CALCIUM GLUCONATE-NACL 1-0.675 GM/50ML-% IV SOLN
1.0000 g | Freq: Once | INTRAVENOUS | Status: AC
  Administered 2022-09-05: 1000 mg via INTRAVENOUS
  Filled 2022-09-05: qty 50

## 2022-09-05 MED ORDER — METOLAZONE 2.5 MG PO TABS
2.5000 mg | ORAL_TABLET | Freq: Once | ORAL | Status: AC
  Administered 2022-09-05: 2.5 mg via ORAL
  Filled 2022-09-05: qty 1

## 2022-09-05 MED ORDER — LIDOCAINE 4 % EX CREA
TOPICAL_CREAM | Freq: Two times a day (BID) | CUTANEOUS | Status: DC | PRN
  Administered 2022-09-05: 1 via TOPICAL
  Filled 2022-09-05: qty 5

## 2022-09-05 MED ORDER — CALCIUM CARBONATE 1250 (500 CA) MG PO TABS
500.0000 mg | ORAL_TABLET | Freq: Three times a day (TID) | ORAL | Status: DC
  Administered 2022-09-05 – 2022-09-10 (×21): 1250 mg via ORAL
  Filled 2022-09-05 (×21): qty 1

## 2022-09-05 MED ORDER — VITAMIN D (ERGOCALCIFEROL) 1.25 MG (50000 UNIT) PO CAPS
50000.0000 [IU] | ORAL_CAPSULE | ORAL | Status: DC
  Administered 2022-09-05: 50000 [IU] via ORAL
  Filled 2022-09-05: qty 1

## 2022-09-05 NOTE — Progress Notes (Signed)
Date and time results received: 09/05/22 0422 (use smartphrase ".now" to insert current time)  Test: calcium Critical Value: 6.1  Name of Provider Notified: August Saucer   Orders Received? Or Actions Taken?:  none

## 2022-09-05 NOTE — Progress Notes (Addendum)
Advanced Heart Failure Rounding Note  PCP-Cardiologist: Tonny Bollman, MD   Subjective:     Stable today with improved but significant peripheral and pulmonary edema. 2.3 L net negative. 2.4 L output. Continues on lasix 12 mg/h. Renal function stable 3.3->3.2->3.4->3.25.   Objective:   Weight Range: 81.8 kg Body mass index is 34.07 kg/m.   Vital Signs:   Temp:  [97.5 F (36.4 C)-98.8 F (37.1 C)] 98.8 F (37.1 C) (07/18 0756) Pulse Rate:  [80-91] 80 (07/18 0355) Resp:  [10-21] 10 (07/18 0756) BP: (123-161)/(73-80) 161/76 (07/18 0756) SpO2:  [100 %] 100 % (07/18 0355) Weight:  [81.8 kg] 81.8 kg (07/18 0712) Last BM Date : 09/03/22  Weight change: Filed Weights   09/03/22 0400 09/04/22 0356 09/05/22 0712  Weight: 85.7 kg 84.4 kg 81.8 kg    Intake/Output:   Intake/Output Summary (Last 24 hours) at 09/05/2022 0919 Last data filed at 09/05/2022 0900 Gross per 24 hour  Intake 454.34 ml  Output 3150 ml  Net -2695.66 ml      Physical Exam    General:  Well appearing. No resp difficulty Neck: supple. JVD present .  Cor: RRR with PVCs. Loud systolic murmur over RUSB. Lungs: crackles and wheezing throughout posterior lung fields  Abdomen: soft, nontender, nondistended. Extremities: stable 2+ pitting edema in the upper and lower extremities bilaterally Neuro: alert & orientedx3   Telemetry   1st degree AV block with frequent PVCs and episodes of 2nd degree AV block  EKG    08/30/2022 SR w/ 2nd degree AV block type 1 08/31/2022 SR w/ 1st degree AV block and PVCs  Labs    CBC Recent Labs    09/04/22 0455 09/05/22 0225  WBC 2.8* 2.5*  HGB 7.5* 7.4*  HCT 24.5* 24.4*  MCV 90.7 90.7  PLT 279 289   Basic Metabolic Panel Recent Labs    16/10/96 0455 09/04/22 1832 09/05/22 0225  NA 137 135 136  K 3.9 4.4 4.3  CL 107 104 104  CO2 21* 21* 22  GLUCOSE 97 160* 89  BUN 76* 76* 77*  CREATININE 3.19* 3.42* 3.25*  CALCIUM 6.1* 6.1* 6.1*  MG  --   --   1.9  PHOS 4.9* 5.1*  --    Liver Function Tests Recent Labs    09/04/22 0455 09/04/22 1832  ALBUMIN 1.8* 1.9*    BNP: BNP (last 3 results) Recent Labs    06/14/22 1152 08/27/22 1200 08/30/22 1130  BNP 711.2* 2,010.8* 2,133.4*     Medications:     Scheduled Medications:  aspirin  81 mg Oral Daily   calcium carbonate  1,000 mg of elemental calcium Oral TID WC   Chlorhexidine Gluconate Cloth  6 each Topical Daily   cholecalciferol  1,000 Units Oral Daily   enoxaparin (LOVENOX) injection  30 mg Subcutaneous Q24H   hydrALAZINE  50 mg Oral Q8H   lactose free nutrition  237 mL Oral BID BM   multivitamin  1 tablet Oral QHS   pantoprazole  40 mg Oral Daily   PHENobarbital  129.6 mg Oral QHS   polyethylene glycol  17 g Oral Daily   rosuvastatin  40 mg Oral Daily   senna-docusate  2 tablet Oral BID   zonisamide  200 mg Oral QHS    Infusions:  furosemide (LASIX) 200 mg in dextrose 5 % 100 mL (2 mg/mL) infusion 12 mg/hr (09/04/22 2322)    PRN Medications: acetaminophen **OR** acetaminophen, albuterol    Patient  Profile   77 y/o female w/ PMH of severe AS, HFpEF, CAD, HTN, CKD stage 4, and hx of AV block who presented with volume overload, bilateral pleural effusions, and AKI on CKD 4.   Assessment/Plan    Acute on Chronic Diastolic Heart Failure   Extensive workup most consistent with hypertensive/obstructive/ischemic cardiomyopathy  Cardiac MRI was concerning for cardiac amyloidosis (11/19), as was echo with moderate LVH and aortic stenosis. The aortic stenosis and the pericardial effusion seen by echo are also frequently seen with cardiac amyloidosis. She additionally has tingling/numbness in hands/feet that is consistent with peripheral neuropathy. However, PYP scan in 11/19 was negative and myeloma workup was negative. Repeat PYP scan in 11/22 was again negative and she did not have a transthyretin gene mutation on [employer] gene testing. Myeloma workup was again  negative in 10/22. Therefore, cardiac amyloidosis seems unlikely and LVH may be due to HTN and aortic stenosis though delayed enhancement from MRI is more suggestive of infiltrative disease.  Stable Echo here with worsening aortic stenosis (described below) Renal function and AV block limiting GDMT, avoid ACE/ARB/ARNI/BB Exacerbation likely 2/2 inadequate oral diuretic dose with progressive renal disease and worsening aortic stenosis Diuresing well but still has significant edema. 2.3 L out last 24 hours on Lasix drip and metolazone 2.5 mg  Jardiance not restarted, possible UTI on admission, could resume during admission if renal function stabilizes or improves, continue to hold today Continue lasix drip 12 mg/hr, metolazone 2.5 mg again today, strict in/out, continue foley, daily weights, daily RFP Severe Aortic Stenosis Worsening of AS, previously paradoxical low flow/gradient AS but TTE here shows gradient of 50 mmHg, area of 0.62 cm2, and Vmax of 4.56 m/s. 12/21 LHC/RHC and echo were suggestive of moderate aortic stenosis.  Repeat echo in 11/22 showed paradoxical low flow/low gradient severe aortic stenosis, echo in 9/23 was similar.  She was seen by Dr. Excell Seltzer for TAVR evaluation, last seen 04/2022.  At that time they decided not to move forward with TAVR because no symptom burden, minimal activity, and comorbidities including CKD stage IV and AV block with high likelihood of of post TAVR pacemaker requirement. Still minimally active but having symptoms with activity such as sweeping the floor in her powered chair Contributing to heart failure exacerbation and AKI/progressive CKD Discussed with Dr. Excell Seltzer, still a very high risk procedure for this patient but without it she will continue to decline. Patient counseled on risks. Wants to pursue TAVR eval and is ok with pacemaker and dialysis at this point.  We will diurese while admitted and plan for outpatient TAVR eval with Dr. Excell Seltzer AKI on CKD  stage IV Cr peak 3.6 (stable today 3.3->3.2->3.4->3.25) with baseline 2.8 Followed by [facility] outpatient Good urine output on lasix drip, continue to monitor RFP daily Avoid nephrotoxic agents as above 1st/2nd degree AV block Long history of 1st degree AV block with PR interval 392 ms here, admission EKG with 2nd degree type 1 block Very high risk of post TAVR AV block needing pacemaker placement Continue to avoid BB and other AV nodal blocking agents CAD LHC (4/15) with 80% and 50% tandem stenoses in the proximal LAD, 80% mid LCx, 85% dLCx, diffuse mid-distal disease up to 90% in RCA. Diffuse coronary disease, treated medically  No evidence of ACS on admission or follow up EKG, no chest pain Would need pre-op LHC/RHC for TAVR, difficult with renal function Continue aspirin and statin HTN Home medications of hydralazine 100 mg tid and amlodipine which  was discontinued outpatient  Hydralazine and amlodipine held on admission BP improved after starting 1/2 home hydralazine Continue hydralazine at 50 mg tid GOC Goals of care discussions ongoing and patient is agreeable to palliative consult. Overall worsening AS causing heart failure exacerbations, progressive renal disease, and high risk patient for TAVR. Counseled on risks of renal failure and dialysis, heart block and pacemaker, and possible inability to tolerate these procedures.    Length of Stay: 5  Sherry Morel, DO  09/05/2022, 9:19 AM  Advanced Heart Failure Team Pager [phone] (M-F; 7a - 5p)  Please contact [facility] for night-coverage after hours (5p -7a ) and weekends on [email or web address]  Patient seen with resident, agree with the above note.   Weight down 6 lbs with Lasix gtt 12 mg/hr + metolazone 2.5 x 1.  Creatinine mildly lower at 3.25.   General: NAD Neck: JVP 14+, no thyromegaly or thyroid nodule.  Lungs: Clear to auscultation bilaterally with normal respiratory effort. CV: Nondisplaced PMI.  Heart regular S1/S2, no  S3/S4, 3/6 SEM RUSB.  1+ edema to knees.  Abdomen: Soft, nontender, no hepatosplenomegaly, no distention.  Skin: Intact without lesions or rashes.  Neurologic: Alert and oriented x 3.  Psych: Normal affect. Extremities: No clubbing or cyanosis.  HEENT: Normal.   1. Aortic stenosis: 12/21 LHC/RHC and echo were suggestive of moderate aortic stenosis.  Repeat echo in 11/22 showed paradoxical low flow/low gradient severe aortic stenosis, echo in 9/23 was similar.  She was seen by Dr. Excell Seltzer for TAVR evaluation.  She is very limited in terms of activity, basically wheel-chair bound at baseline. She has a long 1st degree AVB and has episodes of type 1 2nd degree AVB.  She also has CKD stage IV.  TAVR could be done, but high risk for progression to ESRD and need for pacemaker.  She would have a hard time with HD due to lack of mobility.  Echo this admission with severe AS, mean gradient worsened to 50 mmHg.  She is more symptomatic.  - I think she would be a marginal TAVR candidate.  Will plan to diurese her as much as we can this admission then have her followup with Dr. Excell Seltzer as an outpatient to discuss TAVR.  2. CAD: Moderate-severe diffuse CAD on coronary angiography in 4/15. She would not be a candidate for CABG due to lack of mobility. She has occasional chest discomfort with activities but this is not prominent.   - Continue ASA 81.  - Continue Crestor. - If we were to pursue TAVR, would need coronary angiography.  This would be difficult with CKD stage IV.  3. AKI on CKD stage IV: Baseline creatinine around 2.5+, up to 3.35 this admission, down to 3.25 today.   - Follow closely with diuresis.  4. Acute on chronic diastolic CHF: Cardiac MRI was concerning for cardiac amyloidosis (11/19), as was echo with moderate LVH and aortic stenosis. The aortic stenosis and the pericardial effusion seen by echo are also frequently seen with cardiac amyloidosis. She additionally has tingling/numbness in  hands/feet that is consistent with peripheral neuropathy.  However, PYP scan in 11/19 was negative and myeloma workup was negative.  Repeat PYP scan in 11/22 was again negative and she did not have a transthyretin gene mutation on [facility] gene testing.  Myeloma workup was again negative in 10/22.  Therefore, cardiac amyloidosis seems unlikely and LVH may be due to HTN and aortic stenosis though delayed enhancement from MRI is more suggestive of infiltrative  disease. Echo this admission shows EF 60-65%, severe LVH, normal RV, small to moderate pericardial effusion (unchanged), severe AS with mean gradient 50 mmHg (AS has been severe in past but mean gradient has progressed).  She reports dyspnea x 2 wks and has significant volume overload on exam, complicated by CKD stage IV.  I suspect that her CHF is driven by severe AS.  She diuresed yesterday on Lasix 12 mg/hr + metolazone 2.5 x 1, still volume overloaded.  - Continue Lasix 12 mg/hr, will give 1 dose of metolazone 2.5 mg and will replace Mg.  - No ARB/ARNi or spiro with CKD - Hold Jardiance for now until creatinine stabilizes.  5. HTN: BP controlled on hydralazine 50 mg tid.  6. Conduction abnormality: Long 1st degree AVB, also runs of type 1 2nd degree AVB have been noted.   - Avoid beta blockade.  - High risk for progressive HB with TAVR, would likely end up with PPM.  Needs at least another day of aggressive diuresis.   Marca Ancona 09/05/2022 11:31 AM

## 2022-09-05 NOTE — Progress Notes (Signed)
This chaplain responded to PMT NP-[name]'s consult for prayer in the setting of the Pt. upcoming decisions. The Pt. accepted the chaplain's visit. The Pt. husband is at the bedside.  The Pt. accepted the chaplain invitation for story telling. The chaplain listened reflectively as the Pt. told the story of her faith and family. The chaplain understands the Pt recognizes God's love through many years of illness. The Pt. trusts God will not leave her side as she anticipates rehab and more prayer when she returns home. Both the Pt. and her husband are grateful for the generations of family support.  The Pt. accepted the chaplain's invitation for prayer and F/U spiritual care.  Chaplain Stephanie Acre [phone]

## 2022-09-05 NOTE — Progress Notes (Signed)
OT Cancellation Note  Patient Details Name: Sherry [name] MRN: [id number] DOB: February 17, 1946   Cancelled Treatment:    Reason Eval/Treat Not Completed: Fatigue/lethargy limiting ability to participate. OT will follow up next available time  Galen Manila 09/05/2022, 2:05 PM

## 2022-09-05 NOTE — Consult Note (Signed)
Consultation Note Date: 09/05/2022   Patient Name: Sherry [name]  DOB: 03/21/1945  MRN: [id number]  Age / Sex: 77 y.o., female  PCP: Gwenevere Abbot, MD Referring Physician: Reymundo Poll, MD  Reason for Consultation: Establishing goals of care  HPI/Patient Profile: 77 y.o. female  with past medical history of HFpEF, severe AS and mild MR, CKD4, seizure disorder, osteoporosis, hypertension, hyperlipidemia admitted on 08/30/2022 with concern for severe volume overload and significant edema in outpatient [facility] clinic.   Patient was admitted for acute heart failure's exacerbation and started on IV diuresis, as well as calcium replacement, AKI secondary to UTI.  She is high risk for procedures and considering TAVR if a candidate.  PMT has been consulted to assist with goals of care conversation.  Clinical Assessment and Goals of Care:  I have reviewed medical records including EPIC notes, labs and imaging, received report from RN, assessed the patient and then met the bedside with patient's husband Johnny to discuss diagnosis prognosis, GOC, EOL wishes, disposition and options.  I introduced Palliative Medicine as specialized medical care for people living with serious illness. It focuses on providing relief from the symptoms and stress of a serious illness. The goal is to improve quality of life for both the patient and the family.  We discussed a brief life review of the patient and then focused on their current illness.   I attempted to elicit values and goals of care important to the patient.    Medical History Review and Understanding:  Patient is a good understanding the severity of her illness.  She states her heart is "not where it needs to be."  She also describes that her heart and kidneys are "fighting each other" as well as many other chronic illnesses.  Social History: Patient is married and lives  at home with her husband.  Per outpatient [facility] palliative care notes, she has 1 daughter and her only son has died.  Functional and Nutritional State: Patient confirms she is wheelchair-bound and stands very briefly on occasion.  She would like to stay at this level of functioning.  Albumin is very low at 1.9.  Palliative Symptoms: Chronic back pain, 10 out of 10  Advance Directives: A detailed discussion regarding advanced directives was had.  No HCPOA or living will on file.  Reviewed MOST form on file from 05/2022.  Patient previously indicated desire for DNR, full scope of treatment including mechanical ventilation, IV fluids and antibiotics if indicated, and no feeding tube.  Code Status: Concepts specific to code status, artifical feeding and hydration, and rehospitalization were considered and discussed.  Patient confirms her desire is for DNR as written on her MOST form.  Discussion: Patient states that she fully understands the risks associated with proceeding with aggressive care and interventions such as TAVR.  She is still not sure what to do.  I explored her hopes and thoughts on what she would like to gain from these interventions.  Her hope and goal is for a couple more years of life, during which she can spend more time outside like she used to and enjoy every day as much as she can.   She states "if there is no hope, just let me go.  I do not want to linger."  We discussed the importance of ongoing goals of care discussions and her specific thoughts on the risks and benefits of her options.  She states that she knows "if it is not going to do me any  good, then they will not offer it."  Counseled that it is still her decision in the instance where it may be offered even with high risk. Patient is also not sure if she would like to go to SNF.  Her husband is very supportive of her wishes.  This has been rough for him as well.  He seems to indicate that he agrees she would benefit from  SNF.  She can understand the rationale for short-term rehab, but it is still hard for her to think about not being home for so long.  She is very appreciative of the support and space to consider her options.   Discussed the importance of continued conversation with family and the medical providers regarding overall plan of care and treatment options, ensuring decisions are within the context of the patient's values and GOCs.   Questions and concerns were addressed.  Hard Choices booklet left for review. The family was encouraged to call with questions or concerns.  PMT will continue to support holistically.   SUMMARY OF RECOMMENDATIONS   -CODE STATUS updated to DNR -Continue full scope treatment -Ongoing goals of care discussions, patient continues to weigh her options including TAVR, SNF.  She is unsure at this time -Psychosocial and emotional support provided -Patient is agreeable to spiritual care consult   Prognosis:  Poor  Discharge Planning: To Be Determined      Primary Diagnoses: Present on Admission:  Hypocalcemia  UTI (urinary tract infection)  Acute diastolic congestive heart failure (HCC)  Essential hypertension  Acute kidney injury superimposed on CKD [facility])   Physical Exam Vitals and nursing note reviewed.  Constitutional:      General: She is not in acute distress.    Appearance: She is ill-appearing.  Cardiovascular:     Rate and Rhythm: Normal rate.  Pulmonary:     Effort: Pulmonary effort is normal.  Skin:    General: Skin is warm and dry.  Neurological:     Mental Status: She is alert.  Psychiatric:        Mood and Affect: Mood normal.        Behavior: Behavior normal.     Vital Signs: BP (!) 161/76 (BP Location: Left Arm)   Pulse 80   Temp 98.8 F (37.1 C) (Oral)   Resp 10   Wt 81.8 kg Comment: had to use lift and zero bed  SpO2 100%   BMI 34.07 kg/m  Pain Scale: 0-10 POSS *See Group Information*: S-Acceptable,Sleep, easy to  arouse Pain Score: 0-No pain   SpO2: SpO2: 100 % O2 Device:SpO2: 100 % O2 Flow Rate: .O2 Flow Rate (L/min): 0 L/min   Palliative Assessment/Data: 30 to 40%    MDM: High   Lathon Adan Jeni Salles, PA-C  Palliative Medicine Team Team phone # [phone]  Thank you for allowing the Palliative Medicine Team to assist in the care of this patient. Please utilize secure chat with additional questions, if there is no response within 30 minutes please call the above phone number.  Palliative Medicine Team providers are available by phone from 7am to 7pm daily and can be reached through the team cell phone.  Should this patient require assistance outside of these hours, please call the patient's attending physician.

## 2022-09-05 NOTE — Plan of Care (Signed)

## 2022-09-05 NOTE — Progress Notes (Signed)
HD#5 SUBJECTIVE:  Patient Summary: Sherry [name] is a 77 y.o. with a pertinent PMH of HFpEF (60-65% EF), severe AS, mild MR, and CKD 4 who presented for volume overload and directly admitted from [facility] on 7/12 for acute heart failure, AKI on CKD, UTI, and electrolyte disturbances including hypocalcemia.   Overnight Events:  No overnight events reported. Patient's spouse at bedside. Patient is resting in bed in no acute distress. She denies CP, SOB, N/V, or dysuria with the foley catheter. Bloating is improving. Endorsed pain/discomfort near pressure wound on lower back. IMTS team provided updates to treatment plan. Patient and spouse endorsed understanding.   Interim History:   OBJECTIVE:  Vital Signs: Vitals:   09/05/22 0756 09/05/22 1140 09/05/22 1511 09/05/22 1548  BP: (!) 161/76 107/61 131/74 (!) 169/87  Pulse:    92  Resp: 10 (!) 25  (!) 22  Temp: 98.8 F (37.1 C) 98 F (36.7 C)  97.9 F (36.6 C)  TempSrc: Oral Oral  Oral  SpO2:    100%  Weight:       Supplemental O2: Room Air SpO2: 100 % O2 Flow Rate (L/min): 0 L/min FiO2 (%): (!) 0 %  Filed Weights   09/03/22 0400 09/04/22 0356 09/05/22 0712  Weight: 85.7 kg 84.4 kg 81.8 kg    Intake/Output Summary (Last 24 hours) at 09/05/2022 1717 Last data filed at 09/05/2022 1500 Gross per 24 hour  Intake 508.01 ml  Output 2675 ml  Net -2166.99 ml   Net IO Since Admission: -11,045.41 mL [09/05/22 1717]     Latest Ref Rng & Units 09/05/2022    2:25 AM 09/04/2022    4:55 AM 09/03/2022    6:23 AM  CBC  WBC 4.0 - 10.5 K/uL 2.5  2.8  2.7   Hemoglobin 12.0 - 15.0 g/dL 7.4  7.5  7.8   Hematocrit 36.0 - 46.0 % 24.4  24.5  25.7   Platelets 150 - 400 K/uL 289  279  290        Latest Ref Rng & Units 09/05/2022    2:25 AM 09/04/2022    6:32 PM 09/04/2022    4:55 AM  CMP  Glucose 70 - 99 mg/dL 89  811  97   BUN 8 - 23 mg/dL 77  76  76   Creatinine 0.44 - 1.00 mg/dL 9.14  7.82  9.56   Sodium 135 - 145 mmol/L 136  135  137    Potassium 3.5 - 5.1 mmol/L 4.3  4.4  3.9   Chloride 98 - 111 mmol/L 104  104  107   CO2 22 - 32 mmol/L 22  21  21    Calcium 8.9 - 10.3 mg/dL 6.1  6.1  6.1    PTH 213 Ca 6.1 Corrected Ca (using albumin 1.9) 7.8 Magnesium 1.9  Physical Exam: Physical Exam  Constitutional: Resting in bed in no acute respiratory distress. On room air.  CV: RRR, systolic murmur best heard over RUSB; pitting edema b/l upper and lower extremities improved.  Pulmonary/Respiratory: Diffuse crackles along posterior lower lungs and wheezes improved.  Abdominal: Soft, non-tender, non-distended.  Neuro: Alert and oriented, no right upper extremities tremor observed.  Psych:  Normal mood and affect.  Skin: Warm and dry.   Patient Lines/Drains/Airways Status     Active Line/Drains/Airways     Name Placement date Placement time Site Days   Peripheral IV 08/30/22 22 G 1.75" Right Antecubital 08/30/22  1517  Antecubital  3  Urethral Catheter Monica Sixtos RN Straight-tip 14 Fr. 09/02/22  1029  Straight-tip  less than 1   Pressure Injury 08/30/22 Sacrum Mid 08/30/22  1430  -- 3   Wound / Incision (Open or Dehisced) 08/30/22 Incision - Open;Skin tear Pelvis Anterior;Right 08/30/22  1430  Pelvis  3            ASSESSMENT/PLAN:  Assessment: Principal Problem:   Acute diastolic congestive heart failure (HCC) Active Problems:   Essential hypertension   Acute kidney injury superimposed on CKD (HCC)   Severe aortic stenosis   UTI (urinary tract infection)   Hypocalcemia  Acute on Chronic Diastolic Heart Failure Severe AS, non rheumatic  Severe LVH, ?Cardiac amyloid History of 2:1 AV block  Repeat TTE this admission with LVEF of 60-65% with normal LV function, similar to 10/2021 echo. Aortic valve area 0.62, mean gradient of 50 up 29.8 from September 2023 echo. Per chart review, TAVR not an option due to comorbidities. Patient follows with cardiology outpatient (Dr. Excell Seltzer). Consulted cardiology 7/15,  started following patient 7/16. Dr. Shirlee Latch with cardiology counseled patient and husband on risks associated with TAVR including renal failure and dialysis, heart block and pacemaker, and possible inability to tolerate these procedures. Etiology of acute HF likely due to inadequate oral diuretic dose, progressive CKD and worsening aortic stenosis.   PT evaluation: recommended short-term inpatient rehabilitation but patient and husband expressed they would rather discharge home. PT will progress mobility as tolerated and will continue to follow.   OT evaluation: pending  Urine output on IV lasix drip 2475 ccs, weight change of -1.3 kg. Patient is still volume overloaded. Will consider nephrology consult if output decreases, or kidney function worsens.   Cardiology continuing IV lasix drip and repeating metolazone (7/17, 7/18). Weight change -2.6 kg. Cr. 3.25 (3.42 yesterday).   Plan:  - Continue IV lasix drip 12 mg/hr - Metolazone 2.5 mg for 1 dose 7/18 - Continue to hold Jardiance - Telemetry  - Daily weights - Monitor I/Os + weights daily - Renal diet + feeding supplement, following RD recommendations - Incentive spirometer and 4 bronchodilator nebulized treatments - Gave IV Mg 2g - Monitor electrolytes daily    AKI on CKD IV Current presentation concerning for progression of CKD vs congestion in setting of acute HF vs AKI in setting of intravascular depletion 2/2 anasarca vs UTI (see below). No improvement in Cr or GFR on BID 80 mg lasix. Nephrology reviewing prior notes from Dr. Signe Colt from [facility] Will consider nephrology consult following cardiology consult.   Plan:  - Monitor RFP - Avoid nephrotoxic medications - Renal diet and fluid restriction - F/u on RD recommendations  Hypocalcemia Secondary Hyperparathyroidism  Mild vitamin D deficiency PTH 185, Ca 6.1, corrected Ca (using albumin 1.9), in line with secondary hyperparathyroidism associated with her CKD. No overt telemetry  changes. Patient remains asymptomatic without major changes on telemetry. Received calcium carbonate TID and Vitamin D 1000 international units daily until 7/18 with minimal improvement to her calcium levels.  Plan:  - START calcium tablet 1250 TID - START Vitamin D ergocalciferol 1.25 50 units - Completed IV calcium gluconate 1g - Monitor on RFP   Small to Moderate Pericardial Effusion Mentioned on repeat echo; suspect this is due to volume overload in the setting of acute on chronic CHF and advanced CKD V.   Acute on chronic normocytic anemia Hgb today 7.4 from 7.8 yesterday. Chronic anemia likely in setting of chronic disease/stage IV CKD. Patient has history  of hemorrhoids and experiences intermittent straining. No overt signs of bleeding overnight. Last colonoscopy in 2008. Given history of severe AS, question possible AVMs. Will continue to monitor at this time. -Monitor CBC -Transfuse for Hgb less than 7    Pressure wounds Pictures above and on media tab. Wound care consulted, made recommendations (refer to note), will not be following patient. Wound last cleaned 7/18 per nursing. Patient complained of pain associated with wound.  - Added lidocaine 4% cream to be applied to area next to wound, NOT to be applied on wound or broken skin.    Nonbleeding external hemorrhoids with mucosal prolapse Constipation Patient denied pain today. Received a one-time dose of sorbitol on 7/14. Plan: -Scheduled MiraLAX daily and senna S2 tabs twice daily -Monitor for signs of bleeding and discomfort   UTI, resolved Diagnosed outpatient with improvement after starting Keflex therapy on 7/9. Patient completed 5 days of UTI therapy with Cephalexin 500 mg BID on 7/14. Patient denied dysuria today.  Plan:  - Monitor  Subclinical hypothyroidism Elevated TSH 6.844 (April TSH 4.009), Free T4 0.81, likely in the setting of acute illness. -Outpatient follow up  Chronic health conditions Seizure  disorder- continue phenobarbital 129.6 mg, zonisamide/zonegran 200 mg daily; normal phenobarbital levels Chronic pain/osteoarthritis- Tylenol 650 q6h PRN  Asthma- albuterol PRN  CVA- aspirin 81 daily  GERD pantoprazole 40 mg CAD- rosuvastatin 40 mg Osteoporosis- denosumab injection every 6 months DISCONTINUE, patient intolerance  Best Practice: Diet: Renal diet VTE: enoxaparin (LOVENOX) injection 30 mg Start: 08/30/22 1515 Code: FULL  Prior to Admission Living Arrangement: Home with spouse Anticipated Discharge Location: Pending PT evaluation Barriers to Discharge: Medical management.  Dispo: Anticipated discharge in approximately 1-2 day(s).   Signature: Gabrielle Wakeland Colbert Coyer, MD Internal Medicine Resident, PGY-1 [facility]  Pager: @MYPAGER @ 5:17 PM, 09/05/2022   Please contact the on call pager after 5 pm and on weekends at [phone].

## 2022-09-05 NOTE — Plan of Care (Signed)
  Problem: Elimination: Goal: Will not experience complications related to bowel motility Outcome: Progressing   

## 2022-09-06 DIAGNOSIS — J90 Pleural effusion, not elsewhere classified: Secondary | ICD-10-CM

## 2022-09-06 DIAGNOSIS — Z7189 Other specified counseling: Secondary | ICD-10-CM | POA: Diagnosis not present

## 2022-09-06 DIAGNOSIS — E559 Vitamin D deficiency, unspecified: Secondary | ICD-10-CM

## 2022-09-06 DIAGNOSIS — I5031 Acute diastolic (congestive) heart failure: Secondary | ICD-10-CM | POA: Diagnosis not present

## 2022-09-06 DIAGNOSIS — I350 Nonrheumatic aortic (valve) stenosis: Secondary | ICD-10-CM | POA: Diagnosis not present

## 2022-09-06 DIAGNOSIS — I5033 Acute on chronic diastolic (congestive) heart failure: Secondary | ICD-10-CM | POA: Diagnosis not present

## 2022-09-06 DIAGNOSIS — L89899 Pressure ulcer of other site, unspecified stage: Secondary | ICD-10-CM

## 2022-09-06 DIAGNOSIS — N184 Chronic kidney disease, stage 4 (severe): Secondary | ICD-10-CM | POA: Diagnosis not present

## 2022-09-06 DIAGNOSIS — N179 Acute kidney failure, unspecified: Secondary | ICD-10-CM | POA: Diagnosis not present

## 2022-09-06 LAB — RENAL FUNCTION PANEL
Albumin: 1.9 g/dL — ABNORMAL LOW (ref 3.5–5.0)
Anion gap: 13 (ref 5–15)
BUN: 81 mg/dL — ABNORMAL HIGH (ref 8–23)
CO2: 22 mmol/L (ref 22–32)
Calcium: 6.2 mg/dL — CL (ref 8.9–10.3)
Chloride: 98 mmol/L (ref 98–111)
Creatinine, Ser: 3.27 mg/dL — ABNORMAL HIGH (ref 0.44–1.00)
GFR, Estimated: 14 mL/min — ABNORMAL LOW (ref >60)
Glucose, Bld: 103 mg/dL — ABNORMAL HIGH (ref 70–99)
Phosphorus: 5.3 mg/dL — ABNORMAL HIGH (ref 2.5–4.6)
Potassium: 4.2 mmol/L (ref 3.5–5.1)
Sodium: 133 mmol/L — ABNORMAL LOW (ref 135–145)

## 2022-09-06 LAB — GLUCOSE, CAPILLARY
Glucose-Capillary: 85 mg/dL (ref 70–99)
Glucose-Capillary: 77 mg/dL (ref 70–99)

## 2022-09-06 LAB — CBC
HCT: 24.8 % — ABNORMAL LOW (ref 36.0–46.0)
Hemoglobin: 7.7 g/dL — ABNORMAL LOW (ref 12.0–15.0)
MCH: 28.1 pg (ref 26.0–34.0)
MCHC: 31 g/dL (ref 30.0–36.0)
MCV: 90.5 fL (ref 80.0–100.0)
Platelets: 280 10*3/uL (ref 150–400)
RBC: 2.74 MIL/uL — ABNORMAL LOW (ref 3.87–5.11)
RDW: 14.6 % (ref 11.5–15.5)
WBC: 2.8 10*3/uL — ABNORMAL LOW (ref 4.0–10.5)
nRBC: 0 % (ref 0.0–0.2)

## 2022-09-06 MED ORDER — PHENYLEPHRINE-MINERAL OIL-PET 0.25-14-74.9 % RE OINT
1.0000 | TOPICAL_OINTMENT | Freq: Two times a day (BID) | RECTAL | Status: DC | PRN
  Administered 2022-09-06: 1 via RECTAL
  Filled 2022-09-06 (×2): qty 57

## 2022-09-06 MED ORDER — MUSCLE RUB 10-15 % EX CREA
TOPICAL_CREAM | CUTANEOUS | Status: DC | PRN
  Filled 2022-09-06 (×3): qty 85

## 2022-09-06 MED ORDER — EMPAGLIFLOZIN 10 MG PO TABS
ORAL_TABLET | ORAL | Status: AC
  Administered 2022-09-06: 10 mg
  Filled 2022-09-06: qty 1

## 2022-09-06 MED ORDER — EMPAGLIFLOZIN 10 MG PO TABS
10.0000 mg | ORAL_TABLET | Freq: Every day | ORAL | Status: DC
  Administered 2022-09-07 – 2022-09-10 (×4): 10 mg via ORAL
  Filled 2022-09-06 (×4): qty 1

## 2022-09-06 MED ORDER — VITAMIN D (ERGOCALCIFEROL) 1.25 MG (50000 UNIT) PO CAPS: 50000.0000 [IU] | ORAL_CAPSULE | ORAL | Status: DC

## 2022-09-06 MED ORDER — VITAMIN D (ERGOCALCIFEROL) 1.25 MG (50000 UNIT) PO CAPS
50000.0000 [IU] | ORAL_CAPSULE | ORAL | Status: AC
  Administered 2022-09-07: 50000 [IU] via ORAL
  Filled 2022-09-06: qty 1

## 2022-09-06 MED ORDER — CALCIUM GLUCONATE-NACL 2-0.675 GM/100ML-% IV SOLN
2.0000 g | Freq: Once | INTRAVENOUS | Status: AC
  Administered 2022-09-06: 2000 mg via INTRAVENOUS
  Filled 2022-09-06 (×2): qty 100

## 2022-09-06 MED ORDER — HYDRALAZINE HCL 50 MG PO TABS
75.0000 mg | ORAL_TABLET | Freq: Three times a day (TID) | ORAL | Status: DC
  Administered 2022-09-06 – 2022-09-10 (×13): 75 mg via ORAL
  Filled 2022-09-06 (×12): qty 1

## 2022-09-06 NOTE — Progress Notes (Signed)
Occupational Therapy Treatment Patient Details Name: Sherry [name] MRN: [id number] DOB: July 26, 1945 Today's Date: 09/06/2022   History of present illness 77 yo female presents to [facility] on [facility] as dierct admit from internal medicine center with concern for acute HF exacerbation, UTI, AKI, electrolyte imbalance. PMH includes HTN, type I AV block, Aortic Stenosis, HLD,  diastolic CHF, CKD IV, osteoporosis, seizure disorder, and history of CVA.   OT comments  Pt making progress with functional goals. Pt assisted with posterior peri hygiene after BM, max A +2 required for rolling in bed, mod A +2 to sit EOB, Pt participated in grooming/hygiene min guard A, UB dressing min A, UB bathing min A, LB bathing mod A., applying lotion to arms and legs Sup - min guard A. Pt able to lateral scoot along EOB to rail with max A +2 to return to supine. Pt sat EOB approximately 10 minutes for ADL tasks. Pt's R hand observed to have 3+ edema and positioned in elevation with pillow at end of session. OT will continue to follow acutely to maximize level of function and safety   Recommendations for follow up therapy are one component of a multi-disciplinary discharge planning process, led by the attending physician.  Recommendations may be updated based on patient status, additional functional criteria and insurance authorization.    Assistance Recommended at Discharge Frequent or constant Supervision/Assistance  Patient can return home with the following  Help with stairs or ramp for entrance;Assist for transportation;Assistance with cooking/housework;A lot of help with bathing/dressing/bathroom;Two people to help with walking and/or transfers   Equipment Recommendations  None recommended by OT    Recommendations for Other Services      Precautions / Restrictions Precautions Precautions: Fall Precaution Comments: sacral wound (healing, mepilex on), bilat prevalon boots Restrictions Weight Bearing Restrictions:  No       Mobility Bed Mobility Overal bed mobility: Needs Assistance Bed Mobility: Supine to Sit, Sit to Supine Rolling: Max assist, +2 for physical assistance   Supine to sit: Mod assist, +2 for physical assistance Sit to supine: Max assist, +2 for physical assistance   General bed mobility comments: elevated HOB and assist for legs off EOB, elevating trunk and scooting hips. Pt able to lateral scoot towards rail/HOB in prep to return to supine at end of session    Transfers                         Balance Overall balance assessment: Needs assistance Sitting-balance support: Bilateral upper extremity supported, Single extremity supported, Feet supported Sitting balance-Leahy Scale: Poor Sitting balance - Comments: pt able to self correct UB/trunk posture to sit upright with min verbal cues Postural control: Posterior lean                                 ADL either performed or assessed with clinical judgement   ADL Overall ADL's : Needs assistance/impaired     Grooming: Wash/dry hands;Wash/dry face;Min guard;Sitting   Upper Body Bathing: Minimal assistance;Sitting   Lower Body Bathing: Moderate assistance;Sitting/lateral leans   Upper Body Dressing : Minimal assistance;Sitting           Toileting- Clothing Manipulation and Hygiene: Total assistance;Bed level         General ADL Comments: Pt soiled upon arrival, assisted pt with rolling in bed for perihygiene and clena bes pads. Pt sat EOB approximately 10 minutes participating in  ADL tasks    Extremity/Trunk Assessment Upper Extremity Assessment Upper Extremity Assessment: Generalized weakness;RUE deficits/detail RUE Deficits / Details: edema to R hand and wrist   Lower Extremity Assessment Lower Extremity Assessment: Defer to PT evaluation        Vision Ability to See in Adequate Light: 0 Adequate Patient Visual Report: No change from baseline     Perception     Praxis       Cognition Arousal/Alertness: Awake/alert Behavior During Therapy: WFL for tasks assessed/performed Overall Cognitive Status: History of cognitive impairments - at baseline                                          Exercises      Shoulder Instructions       General Comments      Pertinent Vitals/ Pain       Pain Assessment Pain Assessment: No/denies pain Pain Score: 0-No pain Pain Intervention(s): Monitored during session, Repositioned  Home Living                                          Prior Functioning/Environment              Frequency  Min 1X/week        Progress Toward Goals  OT Goals(current goals can now be found in the care plan section)  Progress towards OT goals: Progressing toward goals     Plan Discharge plan remains appropriate    Co-evaluation      Reason for Co-Treatment: For patient/therapist safety   OT goals addressed during session: ADL's and self-care      AM-PAC OT "6 Clicks" Daily Activity     Outcome Measure   Help from another person eating meals?: None Help from another person taking care of personal grooming?: A Little Help from another person toileting, which includes using toliet, bedpan, or urinal?: A Lot Help from another person bathing (including washing, rinsing, drying)?: A Lot Help from another person to put on and taking off regular upper body clothing?: A Little Help from another person to put on and taking off regular lower body clothing?: A Lot 6 Click Score: 16    End of Session    OT Visit Diagnosis: Muscle weakness (generalized) (M62.81)   Activity Tolerance Patient tolerated treatment well   Patient Left in bed;with call bell/phone within reach;with family/visitor present   Nurse Communication          Time: 6213-0865 OT Time Calculation (min): 29 min  Charges: OT General Charges $OT Visit: 1 Visit OT Treatments $Self Care/Home Management : 8-22  mins    Galen Manila 09/06/2022, 12:57 PM

## 2022-09-06 NOTE — Progress Notes (Signed)
HD#6 SUBJECTIVE:  Patient Summary: Sherry [name] is a 77 y.o. with a pertinent PMH of HFpEF (60-65% EF), severe AS, mild MR, and CKD 4 who presented for volume overload and directly admitted from [facility] on 7/12 for acute heart failure, AKI on CKD, UTI, and electrolyte disturbances including hypocalcemia.   Overnight Events:  Chaplain stopped by yesterday to provide spiritual support. OT was unable to complete the evaluation yesterday as patient was fatigued; will try again today.   No overnight events reported. Granddaughter, patient's sister, and patient's spouse at bedside. Dr. Geraldo Pitter with the cardiology service was wrapping up his discussion with the patient and her family. Today, patient denies chest pain, SOB, N/V, or dysuria with the foley catheter. Lower back pain did not improve with lidocaine cream. Bloating is improving with RD changes.   Interim History:   OBJECTIVE:  Vital Signs: Vitals:   09/05/22 2338 09/06/22 0000 09/06/22 0200 09/06/22 0516  BP: (!) 141/71 (!) 144/81 (!) 169/87 131/72  Pulse: 80 82 79 81  Resp: 12 18 14 17   Temp: 98.7 F (37.1 C)  97.9 F (36.6 C) 98 F (36.7 C)  TempSrc: Oral  Oral Oral  SpO2: 97% 99% 99% 100%  Weight:    70.1 kg   Supplemental O2: Room Air SpO2: 100 % O2 Flow Rate (L/min): 0 L/min FiO2 (%): (!) 0 %  Filed Weights   09/04/22 0356 09/05/22 0712 09/06/22 0516  Weight: 84.4 kg 81.8 kg 70.1 kg    Intake/Output Summary (Last 24 hours) at 09/06/2022 1318 Last data filed at 09/06/2022 0517 Gross per 24 hour  Intake 414.67 ml  Output 2775 ml  Net -2360.33 ml   Net IO Since Admission: -12,880.74 mL [09/06/22 1318]     Latest Ref Rng & Units 09/06/2022   12:07 AM 09/05/2022    2:25 AM 09/04/2022    4:55 AM  CBC  WBC 4.0 - 10.5 K/uL 2.8  2.5  2.8   Hemoglobin 12.0 - 15.0 g/dL 7.7  7.4  7.5   Hematocrit 36.0 - 46.0 % 24.8  24.4  24.5   Platelets 150 - 400 K/uL 280  289  279        Latest Ref Rng & Units 09/06/2022    12:07 AM 09/05/2022    2:25 AM 09/04/2022    6:32 PM  CMP  Glucose 70 - 99 mg/dL 696  89  295   BUN 8 - 23 mg/dL 81  77  76   Creatinine 0.44 - 1.00 mg/dL 2.84  1.32  4.40   Sodium 135 - 145 mmol/L 133  136  135   Potassium 3.5 - 5.1 mmol/L 4.2  4.3  4.4   Chloride 98 - 111 mmol/L 98  104  104   CO2 22 - 32 mmol/L 22  22  21    Calcium 8.9 - 10.3 mg/dL 6.2  6.1  6.1    Ca 6.2 (6.1 7/18) Corrected Ca (using albumin 1.9) 7.9 (7.8 7/18) Bed weight - 11.7 kg   Physical Exam: Physical Exam  Constitutional: Resting in bed in no acute respiratory distress. On room air. CV: RRR, unchanged systolic murmur along RUSB; pitting edema improved (up to knees b/l, less swelling in both hands) Pulmonary/Respiratory: Diffuse crackles posteriorly, wheezing improved with increased breath sounds lower lungs Abdominal:  Soft, non-tender, non-distended; positive bowel sounds Neuro: Alert and oriented, no right upper extremities tremor observed  Psych: Normal mood and affect  Skin: Warm and dry  Patient Lines/Drains/Airways Status     Active Line/Drains/Airways     Name Placement date Placement time Site Days   Peripheral IV 08/30/22 22 G 1.75" Right Antecubital 08/30/22  1517  Antecubital  3   Urethral Catheter Monica Sixtos RN Straight-tip 14 Fr. 09/02/22  1029  Straight-tip  less than 1   Pressure Injury 08/30/22 Sacrum Mid 08/30/22  1430  -- 3   Wound / Incision (Open or Dehisced) 08/30/22 Incision - Open;Skin tear Pelvis Anterior;Right 08/30/22  1430  Pelvis  3            ASSESSMENT/PLAN:  Assessment: Principal Problem:   Acute diastolic congestive heart failure (HCC) Active Problems:   Essential hypertension   Acute kidney injury superimposed on CKD (HCC)   Severe aortic stenosis   UTI (urinary tract infection)   Hypocalcemia  Acute on Chronic Diastolic Heart Failure Severe AS, non rheumatic  Severe LVH, ?Cardiac amyloid History of 2:1 AV block  Repeat TTE this admission with  LVEF of 60-65% with normal LV function, similar to 10/2021 echo. Aortic valve area 0.62, mean gradient of 50 up 29.8 from September 2023 echo. Per chart review, TAVR not an option due to comorbidities. Patient follows with cardiology outpatient (Dr. Excell Seltzer). Consulted cardiology 7/15, started following patient 7/16. Dr. Shirlee Latch with cardiology counseled patient and husband on risks associated with TAVR including renal failure and dialysis, heart block and pacemaker, and possible inability to tolerate these procedures. Etiology of acute HF likely due to inadequate oral diuretic dose, progressive CKD and worsening aortic stenosis.   Patient is still volume overloaded. Will consider nephrology consult if output decreases, or kidney function worsens.   Cardiology continuing IV lasix drip then likely transitioning to oral lasix tomorrow. Not repeating metolazone (received doses on 7/17, 7/18). Bed weight change -11.7 kg. Cr. 3.27. Gave IV Mg 2g 7/18.   Plan:  - Continue IV lasix drip 12 mg/hr - Restart Jardiance 10 mg daily - Telemetry  - Daily weights - Monitor I/Os + weights daily - Renal diet + feeding supplement, following RD recommendations - Incentive spirometer and 4 bronchodilator nebulized treatments - Monitor electrolytes daily    AKI on CKD IV Current presentation concerning for progression of CKD vs congestion in setting of acute HF vs AKI in setting of intravascular depletion 2/2 anasarca vs UTI (see below). No improvement in Cr or GFR on BID 80 mg lasix. Nephrology reviewing prior notes from Dr. Signe Colt from [facility]. Will consider nephrology consult following cardiology consult.   Plan:  - Monitor RFP - Avoid nephrotoxic medications - Renal diet and fluid restriction - F/u on RD recommendations  Hypocalcemia Secondary Hyperparathyroidism  Mild vitamin D deficiency PTH 185 and persistently low Ca in line with secondary hyperparathyroidism associated with her CKD. Today Ca 6.2, corrected  Ca (using albumin 1.9) 7.9, No overt telemetry changes. Patient remains asymptomatic without major changes on telemetry. Received calcium carbonate TID and Vitamin D 1000 international units daily until 7/18 with minimal improvement to her calcium levels.  Plan:  - Continue calcium tablet 1250 TID - Changed Vitamind D to every other day x2 doses then weekly (will receive one dose tomorrow then weekly) - Completed IV calcium gluconate 2g - Monitor on RFP   Small to Moderate Pericardial Effusion Mentioned on repeat echo; suspect this is due to volume overload in the setting of acute on chronic CHF and advanced CKD V.   Acute on chronic normocytic anemia Hgb today 7.7 from 7.4 yesterday. Chronic  anemia likely in setting of chronic disease/stage IV CKD. Patient has history of hemorrhoids and experiences intermittent straining. No overt signs of bleeding overnight. Last colonoscopy in 2008. Given history of severe AS, question possible AVMs. Will continue to monitor at this time. -Monitor CBC -Transfuse for Hgb less than 7    Pressure wounds Pictures above and on media tab. Wound care consulted, made recommendations (refer to note), will not be following patient. Wound last cleaned 7/18 per nursing. Patient's pain did not improve too much with lidocaine cream.  - Consider air bed to off-load pressure - Continue lidocaine 4% cream PRN  - Muscle rub CREA topical PRN for muscle pain    Nonbleeding external hemorrhoids with mucosal prolapse Constipation Patient denied pain today. Received a one-time dose of sorbitol on 7/14. Plan: -Scheduled MiraLAX daily and senna S2 tabs twice daily -Monitor for signs of bleeding and discomfort   UTI, resolved Diagnosed outpatient with improvement after starting Keflex therapy on 7/9. Patient completed 5 days of UTI therapy with Cephalexin 500 mg BID on 7/14. Patient denied dysuria today.  Plan:  - Monitor  Subclinical hypothyroidism Elevated TSH 6.844  (April TSH 4.009), Free T4 0.81, likely in the setting of acute illness. -Outpatient follow up  Chronic health conditions Seizure disorder- continue phenobarbital 129.6 mg, zonisamide/zonegran 200 mg daily; normal phenobarbital levels Chronic pain/osteoarthritis- Tylenol 650 q6h PRN  Asthma- albuterol PRN  CVA- aspirin 81 daily  GERD pantoprazole 40 mg CAD- rosuvastatin 40 mg Osteoporosis- denosumab injection every 6 months DISCONTINUE, patient intolerance  Best Practice: Diet: Renal diet VTE: enoxaparin (LOVENOX) injection 30 mg Start: 08/30/22 1515 Code: FULL  Prior to Admission Living Arrangement: Home with spouse Anticipated Discharge Location: Pending PT evaluation Barriers to Discharge: Medical management.  Dispo: Anticipated discharge in approximately 1-2 day(s).   Signature: Petina Muraski Colbert Coyer, MD Internal Medicine Resident, PGY-1 [facility]  Pager: @MYPAGER @ 1:18 PM, 09/06/2022   Please contact the on call pager after 5 pm and on weekends at [phone].

## 2022-09-06 NOTE — Plan of Care (Signed)
  Problem: Education: Goal: Knowledge of General Education information will improve Description: Including pain rating scale, medication(s)/side effects and non-pharmacologic comfort measures 09/06/2022 2021 by Brooke Bonito, RN Outcome: Progressing 09/06/2022 2021 by Brooke Bonito, RN Outcome: Progressing   Problem: Health Behavior/Discharge Planning: Goal: Ability to manage health-related needs will improve 09/06/2022 2021 by Brooke Bonito, RN Outcome: Progressing 09/06/2022 2021 by Brooke Bonito, RN Outcome: Progressing   Problem: Clinical Measurements: Goal: Ability to maintain clinical measurements within normal limits will improve 09/06/2022 2021 by Brooke Bonito, RN Outcome: Progressing 09/06/2022 2021 by Brooke Bonito, RN Outcome: Progressing Goal: Diagnostic test results will improve 09/06/2022 2021 by Brooke Bonito, RN Outcome: Progressing 09/06/2022 2021 by Brooke Bonito, RN Outcome: Progressing Goal: Respiratory complications will improve 09/06/2022 2021 by Brooke Bonito, RN Outcome: Progressing 09/06/2022 2021 by Brooke Bonito, RN Outcome: Progressing Goal: Cardiovascular complication will be avoided 09/06/2022 2021 by Brooke Bonito, RN Outcome: Progressing 09/06/2022 2021 by Brooke Bonito, RN Outcome: Progressing   Problem: Activity: Goal: Risk for activity intolerance will decrease 09/06/2022 2021 by Brooke Bonito, RN Outcome: Progressing 09/06/2022 2021 by Brooke Bonito, RN Outcome: Progressing   Problem: Nutrition: Goal: Adequate nutrition will be maintained 09/06/2022 2021 by Brooke Bonito, RN Outcome: Progressing 09/06/2022 2021 by Brooke Bonito, RN Outcome: Progressing   Problem: Coping: Goal: Level of anxiety will decrease 09/06/2022 2021 by Brooke Bonito, RN Outcome: Progressing 09/06/2022 2021 by Brooke Bonito, RN Outcome: Progressing   Problem: Elimination: Goal: Will not experience complications  related to bowel motility 09/06/2022 2021 by Brooke Bonito, RN Outcome: Progressing 09/06/2022 2021 by Brooke Bonito, RN Outcome: Progressing Goal: Will not experience complications related to urinary retention 09/06/2022 2021 by Brooke Bonito, RN Outcome: Progressing 09/06/2022 2021 by Brooke Bonito, RN Outcome: Progressing   Problem: Pain Managment: Goal: General experience of comfort will improve 09/06/2022 2021 by Brooke Bonito, RN Outcome: Progressing 09/06/2022 2021 by Brooke Bonito, RN Outcome: Progressing   Problem: Safety: Goal: Ability to remain free from injury will improve 09/06/2022 2021 by Brooke Bonito, RN Outcome: Progressing 09/06/2022 2021 by Brooke Bonito, RN Outcome: Progressing   Problem: Skin Integrity: Goal: Risk for impaired skin integrity will decrease 09/06/2022 2021 by Brooke Bonito, RN Outcome: Progressing 09/06/2022 2021 by Brooke Bonito, RN Outcome: Progressing   Problem: Education: Goal: Ability to demonstrate management of disease process will improve Outcome: Progressing Goal: Ability to verbalize understanding of medication therapies will improve Outcome: Progressing Goal: Individualized Educational Video(s) Outcome: Progressing   Problem: Activity: Goal: Capacity to carry out activities will improve Outcome: Progressing   Problem: Cardiac: Goal: Ability to achieve and maintain adequate cardiopulmonary perfusion will improve Outcome: Progressing

## 2022-09-06 NOTE — Progress Notes (Addendum)
Advanced Heart Failure Rounding Note  PCP-Cardiologist: Tonny Bollman, MD   Subjective:     Stable today with improved peripheral and pulmonary edema. 2.6 L net negative yesterday (12.8 L total). Bed weight down 15 kg. 3.5 L output. Continues on lasix 12 mg/h. Renal function stable 3.3->3.2->3.4->3.25->3.27.   Objective:   Weight Range: 70.1 kg Body mass index is 29.2 kg/m.   Vital Signs:   Temp:  [97.9 F (36.6 C)-99.1 F (37.3 C)] 98 F (36.7 C) (07/19 0516) Pulse Rate:  [79-92] 81 (07/19 0516) Resp:  [12-25] 17 (07/19 0516) BP: (107-169)/(61-87) 131/72 (07/19 0516) SpO2:  [97 %-100 %] 100 % (07/19 0516) Weight:  [70.1 kg] 70.1 kg (07/19 0516) Last BM Date : 09/03/22  Weight change: Filed Weights   09/04/22 0356 09/05/22 0712 09/06/22 0516  Weight: 84.4 kg 81.8 kg 70.1 kg    Intake/Output:   Intake/Output Summary (Last 24 hours) at 09/06/2022 0757 Last data filed at 09/06/2022 0517 Gross per 24 hour  Intake 892.67 ml  Output 3500 ml  Net -2607.33 ml      Physical Exam    General:  Well appearing. No resp difficulty Neck: supple. JVD present .  Cor: RRR with PVCs. Loud systolic murmur over RUSB. Lungs: crackles and wheezing throughout posterior lung fields  Abdomen: soft, nontender, nondistended. Extremities: stable 2+ pitting edema in the upper and lower extremities bilaterally Neuro: alert & orientedx3   Telemetry   1st and occasional 2nd degree AV block with frequent PVCs, one episode of R on T  EKG    08/30/2022 SR w/ 2nd degree AV block type 1 08/31/2022 SR w/ 1st degree AV block and PVCs  Labs    CBC Recent Labs    09/05/22 0225 09/06/22 0007  WBC 2.5* 2.8*  HGB 7.4* 7.7*  HCT 24.4* 24.8*  MCV 90.7 90.5  PLT 289 280   Basic Metabolic Panel Recent Labs    13/08/65 0455 09/04/22 1832 09/05/22 0225  NA 137 135 136  K 3.9 4.4 4.3  CL 107 104 104  CO2 21* 21* 22  GLUCOSE 97 160* 89  BUN 76* 76* 77*  CREATININE 3.19*  3.42* 3.25*  CALCIUM 6.1* 6.1* 6.1*  MG  --   --  1.9  PHOS 4.9* 5.1*  --    Liver Function Tests Recent Labs    09/04/22 0455 09/04/22 1832  ALBUMIN 1.8* 1.9*    BNP: BNP (last 3 results) Recent Labs    06/14/22 1152 08/27/22 1200 08/30/22 1130  BNP 711.2* 2,010.8* 2,133.4*     Medications:     Scheduled Medications:  aspirin  81 mg Oral Daily   calcium carbonate  500 mg of elemental calcium Oral TID PC & HS   Chlorhexidine Gluconate Cloth  6 each Topical Daily   cholecalciferol  1,000 Units Oral Daily   enoxaparin (LOVENOX) injection  30 mg Subcutaneous Q24H   hydrALAZINE  50 mg Oral Q8H   lactose free nutrition  237 mL Oral BID BM   multivitamin  1 tablet Oral QHS   pantoprazole  40 mg Oral Daily   PHENobarbital  129.6 mg Oral QHS   polyethylene glycol  17 g Oral Daily   rosuvastatin  40 mg Oral Daily   senna-docusate  2 tablet Oral BID   Vitamin D (Ergocalciferol)  50,000 Units Oral Q7 days   zonisamide  200 mg Oral QHS    Infusions:  furosemide (LASIX) 200 mg in dextrose 5 %  100 mL (2 mg/mL) infusion 12 mg/hr (09/04/22 2322)    PRN Medications: acetaminophen **OR** acetaminophen, albuterol, lidocaine    Patient Profile   77 y/o female w/ PMH of severe AS, HFpEF, CAD, HTN, CKD stage 4, and hx of AV block who presented with volume overload, bilateral pleural effusions, and AKI on CKD 4.   Assessment/Plan    Acute on Chronic Diastolic Heart Failure   Extensive workup most consistent with hypertensive/obstructive/ischemic cardiomyopathy  Cardiac MRI was concerning for cardiac amyloidosis (11/19), as was echo with moderate LVH and aortic stenosis. The aortic stenosis and the pericardial effusion seen by echo are also frequently seen with cardiac amyloidosis. She additionally has tingling/numbness in hands/feet that is consistent with peripheral neuropathy. However, PYP scan in 11/19 was negative and myeloma workup was negative. Repeat PYP scan in 11/22  was again negative and she did not have a transthyretin gene mutation on [employer] gene testing. Myeloma workup was again negative in 10/22. Therefore, cardiac amyloidosis seems unlikely and LVH may be due to HTN and aortic stenosis though delayed enhancement from MRI is more suggestive of infiltrative disease.  Stable Echo here with worsening aortic stenosis (described below) Renal function and AV block limiting GDMT, avoid ACE/ARB/ARNI/BB Exacerbation likely 2/2 inadequate oral diuretic dose with progressive renal disease and worsening aortic stenosis Continues to diurese well, 3.5 L out last 24 hours on Lasix drip and metolazone 2.5 mg  Dry weight appears to be around 66 kg, 70 kg now down from 85 kg Renal function stable with GFR 13-15 over the past 4 days. Jardiance not restarted on admission. Renal function poor but stable and good output. Restart jardiance 10 mg daily today Continue lasix drip 12 mg/hr, plan to transition to oral lasix 120 mg BID tomorrow, strict in/out, continue foley, daily weights, daily RFP Severe Aortic Stenosis Worsening of AS, previously paradoxical low flow/gradient AS but TTE here shows gradient of 50 mmHg, area of 0.62 cm2, and Vmax of 4.56 m/s. 12/21 LHC/RHC and echo were suggestive of moderate aortic stenosis.  Repeat echo in 11/22 showed paradoxical low flow/low gradient severe aortic stenosis, echo in 9/23 was similar.  She was seen by Dr. Excell Seltzer for TAVR evaluation, last seen 04/2022.  At that time they decided not to move forward with TAVR because no symptom burden, minimal activity, and comorbidities including CKD stage IV and AV block with high likelihood of of post TAVR pacemaker requirement. Still minimally active but having symptoms with activity such as sweeping the floor in her powered chair Contributing to heart failure exacerbation and AKI/progressive CKD Discussed with Dr. Excell Seltzer, still a very high risk procedure for this patient but without it she will  continue to decline. Patient counseled on risks. Wants to pursue TAVR eval and is ok with pacemaker and dialysis at this point.  We will diurese while admitted and plan for outpatient TAVR eval with Dr. Excell Seltzer AKI on CKD stage IV/AoCD Cr peak 3.6 (stable today 3.3->3.2->3.4->3.25->3.27) with baseline 2.8, likely new baseline of 3.2 Followed by [facility] outpatient Good urine output on lasix drip, continue to monitor RFP daily Avoid nephrotoxic agents as above Hgb stable 7.7  1st/2nd degree AV block Long history of 1st degree AV block with PR interval 392 ms here, admission EKG with 2nd degree type 1 block Very high risk of post TAVR AV block needing pacemaker placement Continue to avoid BB and other AV nodal blocking agents CAD LHC (4/15) with 80% and 50% tandem stenoses in the proximal  LAD, 80% mid LCx, 85% dLCx, diffuse mid-distal disease up to 90% in RCA. Diffuse coronary disease, treated medically  No evidence of ACS on admission or follow up EKG, no chest pain Would need pre-op LHC/RHC for TAVR, difficult with renal function Continue aspirin and statin HTN Home medications of hydralazine 100 mg tid and amlodipine which was discontinued outpatient  Hydralazine and amlodipine held on admission BP improved after starting 1/2 home hydralazine dose Increase hydralazine to 75 mg tid  GOC Goals of care discussions ongoing and patient is agreeable to palliative consult. Overall worsening AS causing heart failure exacerbations, progressive renal disease, and high risk patient for TAVR. Counseled on risks of renal failure and dialysis, heart block and pacemaker, and possible inability to tolerate these procedures.    Length of Stay: 6  Rocky Morel, DO  09/06/2022, 7:57 AM  Advanced Heart Failure Team Pager [phone] (M-F; 7a - 5p)  Please contact [facility] for night-coverage after hours (5p -7a ) and weekends on [email or web address]  Patient seen with resident, agree with the above note.   I/Os  net negative 2607.  Creatinine stable 3.27.   General: NAD Neck: JVP 9-10 cm, no thyromegaly or thyroid nodule.  Lungs: Clear to auscultation bilaterally with normal respiratory effort. CV: Nondisplaced PMI.  Heart regular S1/S2, no S3/S4, 3/6 SEM RUSB.  1+ ankle edema.  Abdomen: Soft, nontender, no hepatosplenomegaly, no distention.  Skin: Intact without lesions or rashes.  Neurologic: Alert and oriented x 3.  Psych: Normal affect. Extremities: No clubbing or cyanosis.  HEENT: Normal.   Volume status improving, would give 1 more day of Lasix gtt 12 mg/hr.  Ok to restart Jardiance.    Will stabilize and get him back to see Dr. Excell Seltzer as outpatient to discuss TAVR.   Marca Ancona 09/06/2022 12:17 PM

## 2022-09-06 NOTE — Plan of Care (Signed)
  Problem: Clinical Measurements: Goal: Ability to maintain clinical measurements within normal limits will improve Outcome: Progressing Goal: Respiratory complications will improve Outcome: Progressing   Problem: Nutrition: Goal: Adequate nutrition will be maintained Outcome: Progressing

## 2022-09-06 NOTE — Progress Notes (Signed)
Daily Progress Note   Patient Name: Sherry [name]       Date: 09/06/2022 DOB: 10/28/1945  Age: 77 y.o. MRN#: [id number] Attending Physician: Ginnie Smart, MD Primary Care Physician: Gwenevere Abbot, MD Admit Date: 08/30/2022  Reason for Consultation/Follow-up: Establishing goals of care  Subjective: Medical records reviewed including progress notes, labs, imaging. Patient assessed at the bedside.  She was sleeping but easily aroused.  Her husband Sherry [name] was present visiting.  Created space and opportunity for patient's thoughts and feelings in her current illness.  She is feeling a whole lot better today and very appreciative of the support.  She tells me that she is agreeable to SNF for rehab.  We discussed her previous enrollment in [facility] palliative services and recommended continuation of outpatient palliative care moving forward for ongoing goals of care discussions.  She is very Adult nurse and agrees to this plan.  I then notified Sherry [name] of her desire to continue outpatient palliative services given that she has been inactive since February 2024.  Questions and concerns addressed. PMT will continue to support holistically.   Length of Stay: 6   Physical Exam Vitals and nursing note reviewed.  Constitutional:      General: She is not in acute distress. Cardiovascular:     Rate and Rhythm: Normal rate.  Pulmonary:     Effort: Pulmonary effort is normal.  Neurological:     Mental Status: She is alert.  Psychiatric:        Mood and Affect: Mood normal.        Behavior: Behavior normal.             Vital Signs: BP 131/72 (BP Location: Left Arm)   Pulse 81   Temp 98 F (36.7 C) (Oral)   Resp 17   Wt 70.1 kg   SpO2 100%   BMI 29.20 kg/m  SpO2: SpO2: 100 % O2 Device: O2  Device: Room Air O2 Flow Rate: O2 Flow Rate (L/min): 0 L/min      Palliative Assessment/Data: 30 to 40%   Palliative Care Assessment & Plan   Patient Profile: 77 y.o. female  with past medical history of HFpEF, severe AS and mild MR, CKD4, seizure disorder, osteoporosis, hypertension, hyperlipidemia admitted on 08/30/2022 with concern for severe volume overload and significant edema in outpatient  [facility] clinic.    Patient was admitted for acute heart failure's exacerbation and started on IV diuresis, as well as calcium replacement, AKI secondary to UTI.  She is high risk for procedures and considering TAVR if a candidate.   PMT has been consulted to assist with goals of care conversation.  Assessment: Goals of care conversation AKI on CKD 4 Acute on chronic diastolic heart failure Severe AS Hypocalcemia UTI, resolved  Recommendations/Plan: Continue DNR.  Gold form signed and placed on patient's shadow chart for discharge.  Will also scan copy into EMR Continue current care Patient states she is agreeable to SNF Goal is to continue efforts to improve and resume outpatient palliative care services for ongoing goals of care discussions, particularly around TAVR Psychosocial and emotional support provided PMT remains available as needed   Prognosis: Poor  Discharge Planning: Skilled Nursing Facility for rehab with Palliative care service follow-up  Care plan was discussed with patient, patient's husband, Sherry [name] palliative   Total time: I spent 35 minutes in the care of the patient today in the above activities and documenting the encounter.          Lukasz Rogus Jeni Salles, PA-C  Palliative Medicine Team Team phone # [phone]  Thank you for allowing the Palliative Medicine Team to assist in the care of this patient. Please utilize secure chat with additional questions, if there is no response within 30 minutes please call the above phone number.  Palliative Medicine Team  providers are available by phone from 7am to 7pm daily and can be reached through the team cell phone.  Should this patient require assistance outside of these hours, please call the patient's attending physician.

## 2022-09-06 NOTE — Progress Notes (Signed)
Physical Therapy Treatment Patient Details Name: Sherry [name] MRN: [id number] DOB: 05/12/1945 Today's Date: 09/06/2022   History of Present Illness 77 yo female [facility] as dierct admit from internal medicine center with concern for acute HF exacerbation, UTI, AKI, electrolyte imbalance. PMH includes HTN, type I AV block, Aortic Stenosis, HLD,  diastolic CHF, CKD IV, osteoporosis, seizure disorder, and history of CVA.    PT Comments  Pt soiled in stool upon arrival to room, requesting assist for clean up. Pt requiring max +2 assist for rolling bilat and total assist for clean up, states normally she would have called for bed pan vs toilet but says boost has made her stool incontinent. Pt mobilizing to EOB better than previously, and participated in dynamic seated tasks (scooting EOB, ADL tasks) well. Pt continues to require significant physical assist for all mobility, per pt and family they want pt to d/c home. Would recommend maximizing HH services if this is the case given pt's significant mobility and self-care needs.     Assistance Recommended at Discharge Frequent or constant Supervision/Assistance  If plan is discharge home, recommend the following:  Can travel by private vehicle    Two people to help with walking and/or transfers;Two people to help with bathing/dressing/bathroom      Equipment Recommendations  Other (comment) (hoyer lift for home)    Recommendations for Other Services       Precautions / Restrictions Precautions Precautions: Fall Precaution Comments: sacral wound (healing, mepilex on), bilat prevalon boots Restrictions Weight Bearing Restrictions: No     Mobility  Bed Mobility Overal bed mobility: Needs Assistance Bed Mobility: Supine to Sit, Sit to Supine Rolling: Max assist, +2 for physical assistance   Supine to sit: Mod assist, +2 for physical assistance Sit to supine: Max assist, +2 for physical assistance   General bed  mobility comments: elevated HOB and assist for legs off EOB, elevating trunk and scooting hips. Pt able to lateral scoot towards rail/HOB in prep to return to supine at end of session    Transfers                   General transfer comment: nt    Ambulation/Gait                   Stairs             Wheelchair Mobility     Tilt Bed    Modified Rankin (Stroke Patients Only)       Balance Overall balance assessment: Needs assistance Sitting-balance support: Bilateral upper extremity supported, Single extremity supported, Feet supported Sitting balance-Leahy Scale: Fair Sitting balance - Comments: EOB sitting x10 minutes with AP leaning, scooting EOB, postural corrections as pt with preference to lean posteriorly and L Postural control: Posterior lean                                  Cognition Arousal/Alertness: Awake/alert Behavior During Therapy: WFL for tasks assessed/performed Overall Cognitive Status: History of cognitive impairments - at baseline                                          Exercises      General Comments        Pertinent Vitals/Pain Pain Assessment Pain Assessment: Faces  Faces Pain Scale: Hurts a little bit Pain Location: LEs Pain Descriptors / Indicators: Other (Comment) (stiffness) Pain Intervention(s): Limited activity within patient's tolerance, Monitored during session, Repositioned    Home Living                          Prior Function            PT Goals (current goals can now be found in the care plan section) Acute Rehab PT Goals PT Goal Formulation: With patient Time For Goal Achievement: 09/16/22 Potential to Achieve Goals: Good Progress towards PT goals: Progressing toward goals    Frequency    Min 1X/week      PT Plan Current plan remains appropriate    Co-evaluation PT/OT/SLP Co-Evaluation/Treatment: Yes Reason for Co-Treatment: For  patient/therapist safety   OT goals addressed during session: ADL's and self-care      AM-PAC PT "6 Clicks" Mobility   Outcome Measure  Help needed turning from your back to your side while in a flat bed without using bedrails?: A Lot Help needed moving from lying on your back to sitting on the side of a flat bed without using bedrails?: A Lot Help needed moving to and from a bed to a chair (including a wheelchair)?: Total Help needed standing up from a chair using your arms (e.g., wheelchair or bedside chair)?: Total Help needed to walk in hospital room?: Total Help needed climbing 3-5 steps with a railing? : Total 6 Click Score: 8    End of Session   Activity Tolerance: Patient tolerated treatment well Patient left: in bed;with call bell/phone within reach;with bed alarm set;with family/visitor present Nurse Communication: Mobility status PT Visit Diagnosis: Other abnormalities of gait and mobility (R26.89);Muscle weakness (generalized) (M62.81)     Time: 2952-8413 PT Time Calculation (min) (ACUTE ONLY): 29 min  Charges:    $Therapeutic Activity: 8-22 mins PT General Charges $$ ACUTE PT VISIT: 1 Visit                     Sherry [name], PT DPT Acute Rehabilitation Services Secure Chat Preferred  Office [phone]    Sherry [name] Sherry [name] 09/06/2022, 3:32 PM

## 2022-09-06 NOTE — TOC Progression Note (Signed)
Transition of Care [facility]) - Progression Note    Patient Details  Name: Sherry [name] MRN: [id number] Date of Birth: March 03, 1945  Transition of Care [facility]) CM/SW Contact  Mariea Stable Davene Costain, RN Phone Number: 09/06/2022, 4:05 PM  Clinical Narrative:   HH arranged with [facility], message sent to attending for [facility] RN, and PT orders with [facility].     Expected Discharge Plan: Home w Home Health Services Barriers to Discharge: Continued Medical Work up  Expected Discharge Plan and Services   Discharge Planning Services: CM Consult Post Acute Care Choice: Home Health Living arrangements for the past 2 months: Single Family Home                 DME Arranged: Other see comment DME Agency: [employer] Date DME Agency Contacted: 09/04/22 Time DME Agency Contacted: [phone] Representative spoke with at DME Agency: Shaune Leeks HH Arranged: RN, PT [facility] Agency: [facility] Date [facility] Agency Contacted: 09/04/22 Time HH Agency Contacted: [phone] Representative spoke with at [employer] Agency: Hassel Neth   Social Determinants of Health (SDOH) Interventions SDOH Screenings   Food Insecurity: No Food Insecurity (08/30/2022)  Housing: Patient Declined (08/30/2022)  Transportation Needs: No Transportation Needs (08/30/2022)  Utilities: Not At Risk (08/30/2022)  Depression (PHQ2-9): Low Risk  (06/14/2022)  Physical Activity: Inactive (10/17/2021)  Stress: No Stress Concern Present (10/17/2021)  Tobacco Use: Medium Risk (08/30/2022)    Readmission Risk Interventions     No data to display

## 2022-09-06 NOTE — Progress Notes (Addendum)
[facility] [phone] [facility] Liaison Note  This patient has been referred to [facility] outpatient palliative program after discharge.  We will continue to follow for discharge disposition.  Please call with any outpatient palliative care questions or concerns.  Thank you, Haynes Bast, BSN, [facility] [phone]

## 2022-09-07 DIAGNOSIS — N184 Chronic kidney disease, stage 4 (severe): Secondary | ICD-10-CM | POA: Diagnosis not present

## 2022-09-07 DIAGNOSIS — I5031 Acute diastolic (congestive) heart failure: Secondary | ICD-10-CM | POA: Diagnosis not present

## 2022-09-07 DIAGNOSIS — Q251 Coarctation of aorta: Secondary | ICD-10-CM

## 2022-09-07 DIAGNOSIS — N179 Acute kidney failure, unspecified: Secondary | ICD-10-CM | POA: Diagnosis not present

## 2022-09-07 DIAGNOSIS — I5033 Acute on chronic diastolic (congestive) heart failure: Secondary | ICD-10-CM

## 2022-09-07 DIAGNOSIS — I130 Hypertensive heart and chronic kidney disease with heart failure and stage 1 through stage 4 chronic kidney disease, or unspecified chronic kidney disease: Secondary | ICD-10-CM | POA: Diagnosis not present

## 2022-09-07 DIAGNOSIS — Z87891 Personal history of nicotine dependence: Secondary | ICD-10-CM

## 2022-09-07 LAB — RENAL FUNCTION PANEL
Albumin: 1.9 g/dL — ABNORMAL LOW (ref 3.5–5.0)
Anion gap: 13 (ref 5–15)
BUN: 89 mg/dL — ABNORMAL HIGH (ref 8–23)
CO2: 24 mmol/L (ref 22–32)
Calcium: 6.5 mg/dL — ABNORMAL LOW (ref 8.9–10.3)
Chloride: 96 mmol/L — ABNORMAL LOW (ref 98–111)
Creatinine, Ser: 3.34 mg/dL — ABNORMAL HIGH (ref 0.44–1.00)
GFR, Estimated: 14 mL/min — ABNORMAL LOW (ref >60)
Glucose, Bld: 90 mg/dL (ref 70–99)
Phosphorus: 5.6 mg/dL — ABNORMAL HIGH (ref 2.5–4.6)
Potassium: 4.1 mmol/L (ref 3.5–5.1)
Sodium: 133 mmol/L — ABNORMAL LOW (ref 135–145)

## 2022-09-07 LAB — CBC
HCT: 24.7 % — ABNORMAL LOW (ref 36.0–46.0)
Hemoglobin: 7.7 g/dL — ABNORMAL LOW (ref 12.0–15.0)
MCH: 28.4 pg (ref 26.0–34.0)
MCHC: 31.2 g/dL (ref 30.0–36.0)
MCV: 91.1 fL (ref 80.0–100.0)
Platelets: 285 10*3/uL (ref 150–400)
RBC: 2.71 MIL/uL — ABNORMAL LOW (ref 3.87–5.11)
RDW: 14.6 % (ref 11.5–15.5)
WBC: 2.4 10*3/uL — ABNORMAL LOW (ref 4.0–10.5)
nRBC: 0 % (ref 0.0–0.2)

## 2022-09-07 LAB — GLUCOSE, CAPILLARY: Glucose-Capillary: 107 mg/dL — ABNORMAL HIGH (ref 70–99)

## 2022-09-07 MED ORDER — TORSEMIDE 20 MG PO TABS
60.0000 mg | ORAL_TABLET | Freq: Every day | ORAL | Status: DC
  Administered 2022-09-07 – 2022-09-09 (×3): 60 mg via ORAL
  Filled 2022-09-07 (×4): qty 3

## 2022-09-07 NOTE — Progress Notes (Signed)
Patient ID: Sherry [name], female   DOB: [birth year], 77 y.o.   MRN: [id number]     Advanced Heart Failure Rounding Note  PCP-Cardiologist: Tonny Bollman, MD   Subjective:    Good diuresis again, I/Os net 2546 negative.  No weight yet.  Creatinine 3.27 => 3.34.   No wheezing, thinks breathing is better.    Objective:   Weight Range: 70.1 kg Body mass index is 29.2 kg/m.   Vital Signs:   Temp:  [98 F (36.7 C)-98.7 F (37.1 C)] 98 F (36.7 C) (07/20 0715) Pulse Rate:  [76-88] 76 (07/20 0715) Resp:  [14-20] 16 (07/20 0715) BP: (134-154)/(68-81) 143/70 (07/20 0715) SpO2:  [97 %-99 %] 97 % (07/20 0715) Last BM Date : 09/06/22  Weight change: Filed Weights   09/04/22 0356 09/05/22 0712 09/06/22 0516  Weight: 84.4 kg 81.8 kg 70.1 kg    Intake/Output:   Intake/Output Summary (Last 24 hours) at 09/07/2022 1029 Last data filed at 09/07/2022 0900 Gross per 24 hour  Intake 994.06 ml  Output 3300 ml  Net -2305.94 ml      Physical Exam    General: NAD Neck: JVP 8-9 cm, no thyromegaly or thyroid nodule.  Lungs: Clear to auscultation bilaterally with normal respiratory effort. CV: Nondisplaced PMI.  Heart regular S1/S2, no S3/S4, 3/6 SEM RUSB.  1+ ankle edema.  Abdomen: Soft, nontender, no hepatosplenomegaly, no distention.  Skin: Intact without lesions or rashes.  Neurologic: Alert and oriented x 3.  Psych: Normal affect. Extremities: No clubbing or cyanosis.  HEENT: Normal.   Telemetry   NSR, personally reviewed  Labs    CBC Recent Labs    09/06/22 0007 09/07/22 0242  WBC 2.8* 2.4*  HGB 7.7* 7.7*  HCT 24.8* 24.7*  MCV 90.5 91.1  PLT 280 285   Basic Metabolic Panel Recent Labs    36/64/40 0225 09/06/22 0007 09/07/22 0242  NA 136 133* 133*  K 4.3 4.2 4.1  CL 104 98 96*  CO2 22 22 24   GLUCOSE 89 103* 90  BUN 77* 81* 89*  CREATININE 3.25* 3.27* 3.34*  CALCIUM 6.1* 6.2* 6.5*  MG 1.9  --   --   PHOS  --  5.3* 5.6*   Liver Function  Tests Recent Labs    09/06/22 0007 09/07/22 0242  ALBUMIN 1.9* 1.9*    BNP: BNP (last 3 results) Recent Labs    06/14/22 1152 08/27/22 1200 08/30/22 1130  BNP 711.2* 2,010.8* 2,133.4*     Medications:     Scheduled Medications:  aspirin  81 mg Oral Daily   calcium carbonate  500 mg of elemental calcium Oral TID PC & HS   Chlorhexidine Gluconate Cloth  6 each Topical Daily   empagliflozin  10 mg Oral Daily   enoxaparin (LOVENOX) injection  30 mg Subcutaneous Q24H   hydrALAZINE  75 mg Oral Q8H   lactose free nutrition  237 mL Oral BID BM   multivitamin  1 tablet Oral QHS   pantoprazole  40 mg Oral Daily   PHENobarbital  129.6 mg Oral QHS   polyethylene glycol  17 g Oral Daily   rosuvastatin  40 mg Oral Daily   senna-docusate  2 tablet Oral BID   torsemide  60 mg Oral Daily   [START ON 09/14/2022] Vitamin D (Ergocalciferol)  50,000 Units Oral Q7 days   zonisamide  200 mg Oral QHS    Infusions:    PRN Medications: acetaminophen **OR** acetaminophen, albuterol, lidocaine, Muscle Rub,  phenylephrine-shark liver oil-mineral oil-petrolatum    Patient Profile   77 y/o female w/ PMH of severe AS, HFpEF, CAD, HTN, CKD stage 4, and hx of AV block who presented with volume overload, bilateral pleural effusions, and AKI on CKD 4.   Assessment/Plan   1. Aortic stenosis: 12/21 LHC/RHC and echo were suggestive of moderate aortic stenosis.  Repeat echo in 11/22 showed paradoxical low flow/low gradient severe aortic stenosis, echo in 9/23 was similar.  She was seen by Dr. Excell Seltzer for TAVR evaluation.  She is very limited in terms of activity, basically wheel-chair bound at baseline. She has a long 1st degree AVB and has episodes of type 1 2nd degree AVB.  She also has CKD stage IV.  TAVR could be done, but high risk for progression to ESRD and need for pacemaker.  She would have a hard time with HD due to lack of mobility.  Echo this admission with severe AS, mean gradient worsened  to 50 mmHg.  She is more symptomatic.  - I think she would be a marginal TAVR candidate.  Will plan to diurese her as much as we can this admission then have her followup with Dr. Excell Seltzer as an outpatient to discuss TAVR.  2. CAD: Moderate-severe diffuse CAD on coronary angiography in 4/15. She would not be a candidate for CABG due to lack of mobility. She has occasional chest discomfort with activities but this is not prominent.   - Continue ASA 81.  - Continue Crestor. - If we were to pursue TAVR, would need coronary angiography.  This would be difficult with CKD stage IV.  3. AKI on CKD stage IV: Baseline creatinine around 2.5+, 3.27 => 3.34 today.   - Stop IV Lasix today (see below).   4. Acute on chronic diastolic CHF: Cardiac MRI was concerning for cardiac amyloidosis (11/19), as was echo with moderate LVH and aortic stenosis. The aortic stenosis and the pericardial effusion seen by echo are also frequently seen with cardiac amyloidosis. She additionally has tingling/numbness in hands/feet that is consistent with peripheral neuropathy.  However, PYP scan in 11/19 was negative and myeloma workup was negative.  Repeat PYP scan in 11/22 was again negative and she did not have a transthyretin gene mutation on [employer] gene testing.  Myeloma workup was again negative in 10/22.  Therefore, cardiac amyloidosis seems unlikely and LVH may be due to HTN and aortic stenosis though delayed enhancement from MRI is more suggestive of infiltrative disease. Echo this admission shows EF 60-65%, severe LVH, normal RV, small to moderate pericardial effusion (unchanged), severe AS with mean gradient 50 mmHg (AS has been severe in past but mean gradient has progressed).  She reports dyspnea x 2 wks and has significant volume overload on exam, complicated by CKD stage IV.  I suspect that her CHF is driven by severe AS.  She has diuresed well with IV Lasix, suspect still mildly volume overloaded but creatinine is a bit higher  today at 3.34.  - Stop Lasix gtt.  - Can continue Jardiance.  - Start torsemide 60 mg daily this evening, hopefully this can be her home diuretic.  - No ARB/ARNi or spiro with CKD 5. HTN: BP controlled on hydralazine 75 mg tid.  6. Conduction abnormality: Long 1st degree AVB, also runs of type 1 2nd degree AVB have been noted.   - Avoid beta blockade.  - High risk for progressive HB with TAVR, would likely end up with PPM. 7. Debility: Minimally mobile.  Has not been out of bed.  This is her baseline.   To po diuretic, possibly home tomrrow.  Marca Ancona 09/07/2022 10:33 AM

## 2022-09-07 NOTE — Progress Notes (Signed)
Received report from previous RN, Agree with her assessment.

## 2022-09-07 NOTE — Progress Notes (Signed)
HD#7 SUBJECTIVE:  Patient Summary: Sherry [name] is a 77 y.o. with a pertinent PMH of HFpEF (60-65% EF), severe AS, mild MR, and CKD 4 who presented for volume overload and directly admitted from [facility] on 7/12 for acute heart failure, AKI on CKD, UTI, and electrolyte disturbances including hypocalcemia.   Overnight Events: None  Patient with family at bedside this AM. No pain, no subjective wheezing or dyspnea. Feels her swelling has improved significantly.    Interim History:   OBJECTIVE:  Vital Signs: Vitals:   09/07/22 0000 09/07/22 0023 09/07/22 0200 09/07/22 0400  BP:  (!) 149/68  (!) 154/77  Pulse:  77  78  Resp: 18 16 16 14   Temp:  98.3 F (36.8 C)  98.7 F (37.1 C)  TempSrc:  Oral  Axillary  SpO2: 99% 98% 98% 98%  Weight:      Height:       Supplemental O2: Room Air SpO2: 98 % O2 Flow Rate (L/min): 0 L/min FiO2 (%): (!) 0 %  Filed Weights   09/04/22 0356 09/05/22 0712 09/06/22 0516  Weight: 84.4 kg 81.8 kg 70.1 kg    Intake/Output Summary (Last 24 hours) at 09/07/2022 0628 Last data filed at 09/07/2022 0500 Gross per 24 hour  Intake 736.06 ml  Output 3300 ml  Net -2563.94 ml   Net IO Since Admission: -15,444.68 mL [09/07/22 0628]     Latest Ref Rng & Units 09/07/2022    2:42 AM 09/06/2022   12:07 AM 09/05/2022    2:25 AM  CBC  WBC 4.0 - 10.5 K/uL 2.4  2.8  2.5   Hemoglobin 12.0 - 15.0 g/dL 7.7  7.7  7.4   Hematocrit 36.0 - 46.0 % 24.7  24.8  24.4   Platelets 150 - 400 K/uL 285  280  289        Latest Ref Rng & Units 09/07/2022    2:42 AM 09/06/2022   12:07 AM 09/05/2022    2:25 AM  CMP  Glucose 70 - 99 mg/dL 90  161  89   BUN 8 - 23 mg/dL 89  81  77   Creatinine 0.44 - 1.00 mg/dL 0.96  0.45  4.09   Sodium 135 - 145 mmol/L 133  133  136   Potassium 3.5 - 5.1 mmol/L 4.1  4.2  4.3   Chloride 98 - 111 mmol/L 96  98  104   CO2 22 - 32 mmol/L 24  22  22    Calcium 8.9 - 10.3 mg/dL 6.5  6.2  6.1    Ca 6.5  Corrected Ca (using albumin 1.9)  8.2  Physical Exam: Elderly woman laying in bed in NAD speking full sentences in RA RRR with 3/6 systolic murmur in R second upper sternal border. JVP to the mid neck, pitting edema up to the mid bilateral lower extremity, 1-2+, improved Warm and dry skin, legs in foam boots Alert and oriented, conversing appropriately  Patient Lines/Drains/Airways Status     Active Line/Drains/Airways     Name Placement date Placement time Site Days   Peripheral IV 08/30/22 22 G 1.75" Right Antecubital 08/30/22  1517  Antecubital  3   Urethral Catheter Monica Sixtos RN Straight-tip 14 Fr. 09/02/22  1029  Straight-tip  less than 1   Pressure Injury 08/30/22 Sacrum Mid 08/30/22  1430  -- 3   Wound / Incision (Open or Dehisced) 08/30/22 Incision - Open;Skin tear Pelvis Anterior;Right 08/30/22  1430  Pelvis  3  ASSESSMENT/PLAN:  Assessment: Principal Problem:   Acute diastolic congestive heart failure (HCC) Active Problems:   Essential hypertension   Acute kidney injury superimposed on CKD (HCC)   Severe aortic stenosis   UTI (urinary tract infection)   Hypocalcemia  Acute on Chronic Diastolic Heart Failure Severe AS, non rheumatic  Severe LVH, ?Cardiac amyloid History of 2:1 AV block  Repeat TTE this admission with LVEF of 60-65% with normal LV function, similar to 10/2021 echo. Aortic valve area 0.62, mean gradient of 50 up 29.8 from September 2023 echo. Per chart review, TAVR not an option due to comorbidities. Patient follows with cardiology outpatient (Dr. Excell Seltzer). Consulted cardiology 7/15, started following patient 7/16. Dr. Shirlee Latch with cardiology counseled patient and husband on risks associated with TAVR including renal failure and dialysis, heart block and pacemaker, and possible inability to tolerate these procedures. Etiology of HF likely in the setting of worsening AS. Cardiology saw him this AM; transitioned Torsemide 60 mg from Lasix IV. Mild increase in Cr today.  Plan:   - Switching to Torsemide 60 mg daily - Telemetry  - Daily weights - Monitor I/Os + weights daily - Renal diet + feeding supplement, following RD recommendations - Incentive spirometer and 4 bronchodilator nebulized treatments - Monitor electrolytes daily    AKI on CKD IV Current presentation concerning for progression of CKD vs congestion in setting of acute HF vs AKI in setting of intravascular depletion. Mildly increasing Cr today. Will continue monitoring Plan - Monitor RFP - Avoid nephrotoxic medications - Renal diet and fluid restriction - F/u on RD recommendations  Hypocalcemia Secondary Hyperparathyroidism  Mild vitamin D deficiency PTH 185 and persistently low Ca in line with secondary hyperparathyroidism associated with her CKD. Today Ca 6.2, corrected Ca (using albumin 1.9) 7.9, No overt telemetry changes. Patient remains asymptomatic without major changes on telemetry. Received calcium carbonate TID, s/p 2g IV calcium yesterday. Will continue monitoring for now. No changes on telemetry today. Plan:  - Continue calcium tablet 1250 TID - Changed Vitamind D to every other day x2 doses then weekly; will receive one dose today then weekly - Monitor on RFP   Small to Moderate Pericardial Effusion Mentioned on repeat echo; suspect this is due to volume overload in the setting of acute on chronic CHF and advanced CKD V.   Acute on chronic normocytic anemia Stable today. -Monitor CBC -Transfuse for Hgb less than 7    Pressure wounds Pictures above and on media tab. Wound care consulted, made recommendations (refer to note), will not be following patient. Wound last cleaned 7/18 per nursing. Pain controlled today. On bariatric bed. - Continue lidocaine 4% cream PRN  - Muscle rub CREA topical PRN for muscle pain    Nonbleeding external hemorrhoids with mucosal prolapse Constipation Bowel movement today without abdominal pain. Plan: -Scheduled MiraLAX daily and senna S2  tabs twice daily -Monitor for signs of bleeding and discomfort   Chronic health conditions Seizure disorder- continue phenobarbital 129.6 mg, zonisamide/zonegran 200 mg daily; normal phenobarbital levels Chronic pain/osteoarthritis- Tylenol 650 q6h PRN  Asthma- albuterol PRN  CVA- aspirin 81 daily  GERD pantoprazole 40 mg CAD- rosuvastatin 40 mg Osteoporosis- denosumab injection every 6 months DISCONTINUE, patient intolerance  Best Practice: Diet: Renal diet VTE: enoxaparin (LOVENOX) injection 30 mg Start: 08/30/22 1515 Code: FULL  Prior to Admission Living Arrangement: Home with spouse Anticipated Discharge Location: likely home with [facility] PT and [facility] lift vs SNF Barriers to Discharge: Medical management.  Dispo: Anticipated discharge in  approximately 2 days  Morene Crocker, MD [facility] Internal Medicine Program - PGY-2 09/07/2022, 1:54 PM Pager# [phone]  Please contact the on call pager after 5 pm and on weekends at [phone].

## 2022-09-07 NOTE — Plan of Care (Signed)
  Problem: Education: Goal: Knowledge of General Education information will improve Description Including pain rating scale, medication(s)/side effects and non-pharmacologic comfort measures Outcome: Progressing   Problem: Health Behavior/Discharge Planning: Goal: Ability to manage health-related needs will improve Outcome: Progressing   Problem: Clinical Measurements: Goal: Ability to maintain clinical measurements within normal limits will improve Outcome: Progressing Goal: Diagnostic test results will improve Outcome: Progressing Goal: Respiratory complications will improve Outcome: Progressing Goal: Cardiovascular complication will be avoided Outcome: Progressing   Problem: Activity: Goal: Risk for activity intolerance will decrease Outcome: Progressing   Problem: Nutrition: Goal: Adequate nutrition will be maintained Outcome: Progressing   Problem: Coping: Goal: Level of anxiety will decrease Outcome: Progressing   Problem: Elimination: Goal: Will not experience complications related to bowel motility Outcome: Progressing Goal: Will not experience complications related to urinary retention Outcome: Progressing   Problem: Pain Managment: Goal: General experience of comfort will improve Outcome: Progressing   Problem: Safety: Goal: Ability to remain free from injury will improve Outcome: Progressing   Problem: Skin Integrity: Goal: Risk for impaired skin integrity will decrease Outcome: Progressing   Problem: Education: Goal: Ability to demonstrate management of disease process will improve Outcome: Progressing Goal: Ability to verbalize understanding of medication therapies will improve Outcome: Progressing Goal: Individualized Educational Video(s) Outcome: Progressing   Problem: Activity: Goal: Capacity to carry out activities will improve Outcome: Progressing   Problem: Cardiac: Goal: Ability to achieve and maintain adequate cardiopulmonary  perfusion will improve Outcome: Progressing

## 2022-09-08 DIAGNOSIS — I5033 Acute on chronic diastolic (congestive) heart failure: Secondary | ICD-10-CM | POA: Diagnosis not present

## 2022-09-08 DIAGNOSIS — N179 Acute kidney failure, unspecified: Secondary | ICD-10-CM | POA: Diagnosis not present

## 2022-09-08 DIAGNOSIS — I130 Hypertensive heart and chronic kidney disease with heart failure and stage 1 through stage 4 chronic kidney disease, or unspecified chronic kidney disease: Secondary | ICD-10-CM | POA: Diagnosis not present

## 2022-09-08 DIAGNOSIS — I5031 Acute diastolic (congestive) heart failure: Secondary | ICD-10-CM | POA: Diagnosis not present

## 2022-09-08 DIAGNOSIS — N184 Chronic kidney disease, stage 4 (severe): Secondary | ICD-10-CM | POA: Diagnosis not present

## 2022-09-08 LAB — RENAL FUNCTION PANEL
Albumin: 1.9 g/dL — ABNORMAL LOW (ref 3.5–5.0)
Anion gap: 11 (ref 5–15)
BUN: 94 mg/dL — ABNORMAL HIGH (ref 8–23)
CO2: 24 mmol/L (ref 22–32)
Calcium: 6.4 mg/dL — CL (ref 8.9–10.3)
Chloride: 99 mmol/L (ref 98–111)
Creatinine, Ser: 3.4 mg/dL — ABNORMAL HIGH (ref 0.44–1.00)
GFR, Estimated: 13 mL/min — ABNORMAL LOW (ref >60)
Glucose, Bld: 91 mg/dL (ref 70–99)
Phosphorus: 5.6 mg/dL — ABNORMAL HIGH (ref 2.5–4.6)
Potassium: 4 mmol/L (ref 3.5–5.1)
Sodium: 134 mmol/L — ABNORMAL LOW (ref 135–145)

## 2022-09-08 LAB — CBC
HCT: 24.4 % — ABNORMAL LOW (ref 36.0–46.0)
Hemoglobin: 7.5 g/dL — ABNORMAL LOW (ref 12.0–15.0)
MCH: 27.6 pg (ref 26.0–34.0)
MCHC: 30.7 g/dL (ref 30.0–36.0)
MCV: 89.7 fL (ref 80.0–100.0)
Platelets: 276 10*3/uL (ref 150–400)
RBC: 2.72 MIL/uL — ABNORMAL LOW (ref 3.87–5.11)
RDW: 14.6 % (ref 11.5–15.5)
WBC: 2.7 10*3/uL — ABNORMAL LOW (ref 4.0–10.5)
nRBC: 0 % (ref 0.0–0.2)

## 2022-09-08 NOTE — Progress Notes (Signed)
SUBJECTIVE:  Patient Summary: Sherry [name] is a 77 y.o. with a pertinent PMH of HFpEF (60-65% EF), severe AS, mild MR, and CKD 4 who presented for volume overload and directly admitted from [facility] on 7/12 for acute heart failure, AKI on CKD, UTI, and electrolyte disturbances including hypocalcemia.   Overnight Events:  No overnight events. Patient's spouse at bedside. Patient is denying CP, N/V, abdominal pain, headache, or SOB. She does endorse some pain around foley catheter. States her lower back pain has improved with bariatric bed and topicals.   Interim History:   OBJECTIVE:  Vital Signs: Vitals:   09/08/22 0052 09/08/22 0200 09/08/22 0400 09/08/22 0436  BP: 127/70   (!) 122/57  Pulse: 83   79  Resp: 18 14 14 18   Temp: 98.6 F (37 C)   98.2 F (36.8 C)  TempSrc: Oral   Oral  SpO2: 99% 99% 99% 98%  Weight:    78.8 kg  Height:       Supplemental O2: Room Air SpO2: 98 % O2 Flow Rate (L/min): 0 L/min FiO2 (%): (!) 0 %  Filed Weights   09/06/22 0516 09/07/22 0500 09/08/22 0436  Weight: 70.1 kg 69 kg 78.8 kg    Intake/Output Summary (Last 24 hours) at 09/08/2022 0542 Last data filed at 09/08/2022 0451 Gross per 24 hour  Intake 1062 ml  Output 2825 ml  Net -1763 ml   Net IO Since Admission: -17,207.68 mL [09/08/22 0542]     Latest Ref Rng & Units 09/08/2022   12:56 AM 09/07/2022    2:42 AM 09/06/2022   12:07 AM  CBC  WBC 4.0 - 10.5 K/uL 2.7  2.4  2.8   Hemoglobin 12.0 - 15.0 g/dL 7.5  7.7  7.7   Hematocrit 36.0 - 46.0 % 24.4  24.7  24.8   Platelets 150 - 400 K/uL 276  285  280     Renal Functional Panel Component Ref Range & Units 00:56 (09/08/22) 1 d ago (09/07/22) 2 d ago (09/06/22) 3 d ago (09/05/22) 4 d ago (09/04/22) 4 d ago (09/04/22) 5 d ago (09/03/22)  Sodium 135 - 145 mmol/L 134 Low  133 Low  133 Low  136 135 137 139  Potassium 3.5 - 5.1 mmol/L 4.0 4.1 4.2 4.3 4.4 3.9 3.8  Chloride 98 - 111 mmol/L 99 96 Low  98 104 104 107 107  CO2 22 - 32  mmol/L 24 24 22 22 21  Low  21 Low  20 Low   Glucose, Bld 70 - 99 mg/dL 91 90 CM 784 High  CM 89 CM 160 High  CM 97 CM 96 CM  Comment: Glucose reference range applies only to samples taken after fasting for at least 8 hours.  BUN 8 - 23 mg/dL 94 High  89 High  81 High  77 High  76 High  76 High  74 High   Creatinine, Ser 0.44 - 1.00 mg/dL 6.96 High  2.95 High  2.84 High  3.25 High  3.42 High  3.19 High  3.33 High   Calcium 8.9 - 10.3 mg/dL 6.4 Low Panic  6.5 Low  6.2 Low Panic  CM 6.1 Low Panic  CM 6.1 Low Panic  CM 6.1 Low Panic  CM 6.4 Low Panic  CM  Comment: CRITICAL RESULT CALLED TO, READ BACK BY AND VERIFIED WITH N. OLEARY RN7/21/24 @0250  BY J. WHITE  Phosphorus 2.5 - 4.6 mg/dL 5.6 High  5.6 High  5.3 High  5.1 High  4.9 High  5.0 High   Albumin 3.5 - 5.0 g/dL 1.9 Low  1.9 Low  1.9 Low   1.9 Low  1.8 Low  1.9 Low   GFR, Estimated >60 mL/min 13 Low  14 Low  CM 14 Low  CM 14 Low  CM 13 Low  CM 15 Low  CM 14 Low  CM   Ca 6.4 Corrected Ca (using albumin 1.9) 8.1 Bed weight form 69 to 78.8 kg, + 9.8 kg  Physical Exam: Physical Exam  Constitutional: Resting in bed, in no acute distress CV: RRR, systolic murmur heard best along RUSB; no rubs or gallops; trace edema b/l upper extremities, improved 1+ pitting edema b/l lower extremities Pulmonary/Respiratory: On room air, normal respiratory effort, wheezing posterior lungs, improved breath sounds Abdominal: Soft, non-tender, non distended Neuro: Alert and oriented Psych: Normal mood and affect Skin: Warm and dry   Patient Lines/Drains/Airways Status     Active Line/Drains/Airways     Name Placement date Placement time Site Days   Peripheral IV 08/30/22 22 G 1.75" Right Antecubital 08/30/22  1517  Antecubital  3   Urethral Catheter Monica Sixtos RN Straight-tip 14 Fr. 09/02/22  1029  Straight-tip  less than 1   Pressure Injury 08/30/22 Sacrum Mid 08/30/22  1430  -- 3   Wound / Incision (Open or Dehisced) 08/30/22 Incision -  Open;Skin tear Pelvis Anterior;Right 08/30/22  1430  Pelvis  3            ASSESSMENT/PLAN:  Assessment: Principal Problem:   Acute diastolic congestive heart failure (HCC) Active Problems:   Essential hypertension   Acute kidney injury superimposed on CKD (HCC)   Severe aortic stenosis   UTI (urinary tract infection)   Hypocalcemia  Acute on Chronic Diastolic Heart Failure Severe AS, non rheumatic  Severe LVH, ?Cardiac amyloid History of 2:1 AV block  Repeat TTE this admission with LVEF of 60-65% with normal LV function, similar to 10/2021 echo. Aortic valve area 0.62, mean gradient of 50 up 29.8 from September 2023 echo. Per chart review, TAVR not an option due to comorbidities. Patient follows with cardiology outpatient (Dr. Excell Seltzer). Consulted cardiology 7/15, started following patient 7/16. Dr. Shirlee Latch with cardiology counseled patient and husband on risks associated with TAVR including renal failure and dialysis, heart block and pacemaker, and possible inability to tolerate these procedures. Etiology of acute HF likely due to inadequate oral diuretic dose, progressive CKD and worsening aortic stenosis.   Patient's overall weight increased, net - 1.7 L. Stopped IV lasix drip 12 mg/hr 7/20, started PO torsemide. Restarted Jardiance 7/19. Will follow up with cardiology regarding any adjustments to current torsemide 60 dose.   Plan:  - Continue torsemide 60 - Continue Jardiance 10 mg daily - Telemetry  - Daily weights - d/c foley catheter, use external catheter, post-void bladder scan - Renal diet + feeding supplement, following RD recommendations - Incentive spirometer and 4 bronchodilator nebulized treatments - Monitor electrolytes daily    AKI on CKD IV Hyperphosphatemia Current presentation concerning for progression of CKD vs congestion in setting of acute HF vs AKI in setting of intravascular depletion 2/2 anasarca vs UTI (see below). No improvement in Cr or GFR on BID 80  mg lasix. Nephrology reviewing prior notes from Dr. Signe Colt [facility]. Will consider nephrology consult following cardiology consult.   Phosphorus persistently elevated at 5.6  Plan:  - External catheter starting today 9:35 - Monitor RFP - Avoid nephrotoxic medications - Renal diet  and fluid restriction - F/u on RD recommendations - Renvela   Hypocalcemia Secondary Hyperparathyroidism  Mild vitamin D deficiency PTH 185 and persistently low Ca in line with secondary hyperparathyroidism associated with her CKD. Today Ca 6.4, corrected Ca (using albumin 1.9) 8.1, No overt telemetry changes. Patient remains asymptomatic without major changes on telemetry. Received calcium carbonate TID and Vitamin D 1000 international units daily until 7/18 with minimal improvement to her calcium levels.  Plan:  - Continue calcium tablet 1250 TID - Changed Vitamind D to every other day x2 doses then weekly (will receive one dose tomorrow then weekly) - Completed IV calcium gluconate 2g - Monitor on RFP   Small to Moderate Pericardial Effusion Mentioned on repeat echo; suspect this is due to volume overload in the setting of acute on chronic CHF and advanced CKD V.   Acute on chronic normocytic anemia Hgb today 7.5, 7.7 yesterday (range 7.3-7.8). Chronic anemia likely in setting of chronic disease/stage IV CKD. Patient has history of hemorrhoids and experiences intermittent straining. No overt signs of bleeding overnight. Last colonoscopy in 2008. Given history of severe AS, question possible AVMs. Will continue to monitor at this time. -Monitor CBC -Transfuse for Hgb less than 7    Pressure wounds Pictures in media tab. Wound care consulted, made recommendations (refer to note), will not be following patient. Wound last cleaned 7/18 per nursing. Patient's pain did not improve too much with lidocaine cream. Bariatric bed. - Get updated image for media of sacral wound from nursing during wound care - Continue  lidocaine 4% cream PRN  - Muscle rub CREA topical PRN for muscle pain    Nonbleeding external hemorrhoids with mucosal prolapse Constipation Patient denied pain today. Received a one-time dose of sorbitol on 7/14. Plan: -Scheduled MiraLAX daily and senna S2 tabs twice daily -Monitor for signs of bleeding and discomfort   UTI, resolved Diagnosed outpatient with improvement after starting Keflex therapy on 7/9. Patient completed 5 days of UTI therapy with Cephalexin 500 mg BID on 7/14. Patient endorsed some pain around foley 7/21.  Plan:  - d/c foley catheter, use external catheter, get post-void scan - Monitor  Subclinical hypothyroidism Elevated TSH 6.844 (April TSH 4.009), Free T4 0.81, likely in the setting of acute illness. -Outpatient follow up  Chronic health conditions Seizure disorder- continue phenobarbital 129.6 mg, zonisamide/zonegran 200 mg daily; normal phenobarbital levels Chronic pain/osteoarthritis- Tylenol 650 q6h PRN  Asthma- albuterol PRN  CVA- aspirin 81 daily  GERD pantoprazole 40 mg CAD- rosuvastatin 40 mg Osteoporosis- denosumab injection every 6 months DISCONTINUE, patient intolerance  Best Practice: Diet: Renal diet VTE: enoxaparin (LOVENOX) injection 30 mg Start: 08/30/22 1515 Code: FULL  Prior to Admission Living Arrangement: Home with spouse Anticipated Discharge Location: Pending PT evaluation Barriers to Discharge: Medical management.  Dispo: Anticipated discharge in approximately 1-2 day(s).   Signature: Alisha Bacus Colbert Coyer, MD Internal Medicine Resident, PGY-1 [facility]  Pager: @MYPAGER @ 5:42 AM, 09/08/2022   Please contact the on call pager after 5 pm and on weekends at [phone].

## 2022-09-08 NOTE — Progress Notes (Signed)
   Medical records reviewed including progress notes, labs, imaging.  Patient remains stable at this time with likely discharge in the next 24 hours.  Goals of care are clear for continued medical interventions with outpatient palliative care follow-up after discharge.  Patient will benefit from ongoing goals of care discussions regarding TAVR.  PMT will continue to follow peripherally.  Patient and family have been encouraged to reach out with additional needs or concerns.  Thank you for your referral and allowing PMT to assist in Sherry [name]'s care.   Richardson Dopp, [facility] Palliative Medicine Team  Team Phone # [phone]   NO CHARGE

## 2022-09-08 NOTE — Progress Notes (Signed)
Patient ID: Sherry [name], female   DOB: 03/04/1945, 77 y.o.   MRN: [id number]     Advanced Heart Failure Rounding Note  PCP-Cardiologist: Tonny Bollman, MD   Subjective:    Switched over to po torsemide, I/Os net negative 1661 yesterday.  Creatinine 3.27 => 3.34 => 3.4.   Stable breathing.    Objective:   Weight Range: 78.8 kg Body mass index is 32.82 kg/m.   Vital Signs:   Temp:  [98.1 F (36.7 C)-98.7 F (37.1 C)] 98.3 F (36.8 C) (07/21 0717) Pulse Rate:  [79-91] 82 (07/21 0717) Resp:  [13-18] 13 (07/21 0717) BP: (117-127)/(57-78) 117/65 (07/21 0717) SpO2:  [97 %-99 %] 97 % (07/21 0717) Weight:  [78.8 kg] 78.8 kg (07/21 0436) Last BM Date : 09/06/22  Weight change: Filed Weights   09/06/22 0516 09/07/22 0500 09/08/22 0436  Weight: 70.1 kg 69 kg 78.8 kg    Intake/Output:   Intake/Output Summary (Last 24 hours) at 09/08/2022 1011 Last data filed at 09/08/2022 0943 Gross per 24 hour  Intake 1124 ml  Output 3125 ml  Net -2001 ml      Physical Exam    General: NAD Neck: JVP 8-9 cm, no thyromegaly or thyroid nodule.  Lungs: Clear to auscultation bilaterally with normal respiratory effort. CV: Nondisplaced PMI.  Heart regular S1/S2, no S3/S4, 3/6 SEM RUSB.  1+ edema lower legs.  N Abdomen: Soft, nontender, no hepatosplenomegaly, no distention.  Skin: Intact without lesions or rashes.  Neurologic: Alert and oriented x 3.  Psych: Normal affect. Extremities: No clubbing or cyanosis.  HEENT: Normal.   Telemetry   NSR, personally reviewed  Labs    CBC Recent Labs    09/07/22 0242 09/08/22 0056  WBC 2.4* 2.7*  HGB 7.7* 7.5*  HCT 24.7* 24.4*  MCV 91.1 89.7  PLT 285 276   Basic Metabolic Panel Recent Labs    47/82/95 0242 09/08/22 0056  NA 133* 134*  K 4.1 4.0  CL 96* 99  CO2 24 24  GLUCOSE 90 91  BUN 89* 94*  CREATININE 3.34* 3.40*  CALCIUM 6.5* 6.4*  PHOS 5.6* 5.6*   Liver Function Tests Recent Labs    09/07/22 0242  09/08/22 0056  ALBUMIN 1.9* 1.9*    BNP: BNP (last 3 results) Recent Labs    06/14/22 1152 08/27/22 1200 08/30/22 1130  BNP 711.2* 2,010.8* 2,133.4*     Medications:     Scheduled Medications:  aspirin  81 mg Oral Daily   calcium carbonate  500 mg of elemental calcium Oral TID PC & HS   Chlorhexidine Gluconate Cloth  6 each Topical Daily   empagliflozin  10 mg Oral Daily   enoxaparin (LOVENOX) injection  30 mg Subcutaneous Q24H   hydrALAZINE  75 mg Oral Q8H   lactose free nutrition  237 mL Oral BID BM   multivitamin  1 tablet Oral QHS   pantoprazole  40 mg Oral Daily   PHENobarbital  129.6 mg Oral QHS   polyethylene glycol  17 g Oral Daily   rosuvastatin  40 mg Oral Daily   senna-docusate  2 tablet Oral BID   torsemide  60 mg Oral Daily   [START ON 09/14/2022] Vitamin D (Ergocalciferol)  50,000 Units Oral Q7 days   zonisamide  200 mg Oral QHS    Infusions:    PRN Medications: acetaminophen **OR** acetaminophen, albuterol, lidocaine, Muscle Rub, phenylephrine-shark liver oil-mineral oil-petrolatum    Patient Profile   76 y/o female w/  PMH of severe AS, HFpEF, CAD, HTN, CKD stage 4, and hx of AV block who presented with volume overload, bilateral pleural effusions, and AKI on CKD 4.   Assessment/Plan   1. Aortic stenosis: 12/21 LHC/RHC and echo were suggestive of moderate aortic stenosis.  Repeat echo in 11/22 showed paradoxical low flow/low gradient severe aortic stenosis, echo in 9/23 was similar.  Echo this admission with severe AS, gradient higher.  She has been seen by Dr. Excell Seltzer in the past for TAVR evaluation.  She is very limited in terms of activity, basically wheel-chair bound at baseline. She has a long 1st degree AVB and has episodes of type 1 2nd degree AVB.  She also has CKD stage IV.  TAVR could be done, but high risk for progression to ESRD and need for pacemaker.  She would have a hard time with HD due to lack of mobility.  Echo this admission with  severe AS, mean gradient worsened to 50 mmHg.  She is more symptomatic.  - I think she would be a very marginal TAVR candidate.  Will plan to diurese her as much as we can this admission then have her followup with Dr. Excell Seltzer as an outpatient to discuss TAVR.  2. CAD: Moderate-severe diffuse CAD on coronary angiography in 4/15. She would not be a candidate for CABG due to lack of mobility. She has occasional chest discomfort with activities but this is not prominent.   - Continue ASA 81.  - Continue Crestor. - If we were to pursue TAVR, would need coronary angiography.  This would be difficult with CKD stage IV.  3. AKI on CKD stage IV: Baseline creatinine around 2.5+, 3.27 => 3.34 => 3.4 today.   - Off IV Lasix.   4. Acute on chronic diastolic CHF: Cardiac MRI was concerning for cardiac amyloidosis (11/19), as was echo with moderate LVH and aortic stenosis. The aortic stenosis and the pericardial effusion seen by echo are also frequently seen with cardiac amyloidosis. She additionally has tingling/numbness in hands/feet that is consistent with peripheral neuropathy.  However, PYP scan in 11/19 was negative and myeloma workup was negative.  Repeat PYP scan in 11/22 was again negative and she did not have a transthyretin gene mutation on [facility] gene testing.  Myeloma workup was again negative in 10/22.  Therefore, cardiac amyloidosis seems unlikely and LVH may be due to HTN and aortic stenosis though delayed enhancement from MRI is more suggestive of infiltrative disease. Echo this admission shows EF 60-65%, severe LVH, normal RV, small to moderate pericardial effusion (unchanged), severe AS with mean gradient 50 mmHg (AS has been severe in past but mean gradient has progressed).  She reports dyspnea x 2 wks and has significant volume overload on exam, complicated by CKD stage IV.  I suspect that her CHF is driven by severe AS.  She has diuresed well with IV Lasix, suspect still mildly volume overloaded but  creatinine is a bit higher today at 3.4.  - Can continue Jardiance.  - Continue torsemide 60 mg daily, hopefully this can be her home diuretic.  - No ARB/ARNi or spiro with CKD 5. HTN: BP controlled on hydralazine 75 mg tid.  6. Conduction abnormality: Long 1st degree AVB, also runs of type 1 2nd degree AVB have been noted.   - Avoid beta blockade.  - High risk for progressive HB with TAVR, would likely end up with PPM. 7. Debility: Minimally mobile.  Has not been out of bed.  This  is her baseline.   She is now on po diuretic, can go home from my standpoint. Will need close followup in CHF clinic and with Dr. Excell Seltzer.   Marca Ancona 09/08/2022 10:11 AM

## 2022-09-09 DIAGNOSIS — N179 Acute kidney failure, unspecified: Secondary | ICD-10-CM | POA: Diagnosis not present

## 2022-09-09 DIAGNOSIS — I5033 Acute on chronic diastolic (congestive) heart failure: Secondary | ICD-10-CM | POA: Diagnosis not present

## 2022-09-09 DIAGNOSIS — I5031 Acute diastolic (congestive) heart failure: Secondary | ICD-10-CM | POA: Diagnosis not present

## 2022-09-09 DIAGNOSIS — J90 Pleural effusion, not elsewhere classified: Secondary | ICD-10-CM | POA: Diagnosis not present

## 2022-09-09 DIAGNOSIS — N184 Chronic kidney disease, stage 4 (severe): Secondary | ICD-10-CM | POA: Diagnosis not present

## 2022-09-09 LAB — RENAL FUNCTION PANEL
Albumin: 1.8 g/dL — ABNORMAL LOW (ref 3.5–5.0)
Anion gap: 13 (ref 5–15)
BUN: 103 mg/dL — ABNORMAL HIGH (ref 8–23)
CO2: 24 mmol/L (ref 22–32)
Calcium: 6.3 mg/dL — CL (ref 8.9–10.3)
Chloride: 96 mmol/L — ABNORMAL LOW (ref 98–111)
Creatinine, Ser: 3.7 mg/dL — ABNORMAL HIGH (ref 0.44–1.00)
GFR, Estimated: 12 mL/min — ABNORMAL LOW (ref >60)
Glucose, Bld: 103 mg/dL — ABNORMAL HIGH (ref 70–99)
Phosphorus: 6.3 mg/dL — ABNORMAL HIGH (ref 2.5–4.6)
Potassium: 3.8 mmol/L (ref 3.5–5.1)
Sodium: 133 mmol/L — ABNORMAL LOW (ref 135–145)

## 2022-09-09 LAB — GLUCOSE, CAPILLARY: Glucose-Capillary: 83 mg/dL (ref 70–99)

## 2022-09-09 LAB — CBC
HCT: 24.8 % — ABNORMAL LOW (ref 36.0–46.0)
Hemoglobin: 7.6 g/dL — ABNORMAL LOW (ref 12.0–15.0)
MCH: 27.3 pg (ref 26.0–34.0)
MCHC: 30.6 g/dL (ref 30.0–36.0)
MCV: 89.2 fL (ref 80.0–100.0)
Platelets: 283 10*3/uL (ref 150–400)
RBC: 2.78 MIL/uL — ABNORMAL LOW (ref 3.87–5.11)
RDW: 14.3 % (ref 11.5–15.5)
WBC: 3.4 10*3/uL — ABNORMAL LOW (ref 4.0–10.5)
nRBC: 0 % (ref 0.0–0.2)

## 2022-09-09 MED ORDER — SEVELAMER CARBONATE 800 MG PO TABS
800.0000 mg | ORAL_TABLET | Freq: Three times a day (TID) | ORAL | Status: DC
  Administered 2022-09-09 – 2022-09-10 (×6): 800 mg via ORAL
  Filled 2022-09-09 (×6): qty 1

## 2022-09-09 NOTE — Progress Notes (Signed)
Physical Therapy Treatment Patient Details Name: Sherry [name] MRN: [id number] DOB: 05-16-1945 Today's Date: 09/09/2022   History of Present Illness 77 yo female presents to [facility] on 7/12 as dierct admit from internal medicine center with concern for acute HF exacerbation, UTI, AKI, electrolyte imbalance. PMH includes HTN, type I AV block, Aortic Stenosis, HLD,  diastolic CHF, CKD IV, osteoporosis, seizure disorder, and history of CVA.    PT Comments  Pt demonstrating improvement in assist levels, seated balance, and activity tolerance today. Pt overall requiring moderate physical assist for bed mobility, but is very actively participating in mobility. Pt tolerating seated balance and LE exercise well this date. Pt's bed pads noted to be wet, PT assisted in changing these and called RN/wound RN to room to address sacral dressing. Plan remains appropriate.       Assistance Recommended at Discharge Frequent or constant Supervision/Assistance  If plan is discharge home, recommend the following:  Can travel by private vehicle    Two people to help with walking and/or transfers;Two people to help with bathing/dressing/bathroom      Equipment Recommendations  Other (comment) (hoyer)    Recommendations for Other Services       Precautions / Restrictions Precautions Precautions: Fall Precaution Comments: sacral wound (healing, mepilex on), bilat prevalon boots (not donned during session) Restrictions Weight Bearing Restrictions: No     Mobility  Bed Mobility Overal bed mobility: Needs Assistance Bed Mobility: Rolling, Sidelying to Sit, Sit to Sidelying Rolling: Mod assist Sidelying to sit: Mod assist, HOB elevated     Sit to sidelying: Mod assist, HOB elevated General bed mobility comments: assist for trunk roll, trunk elevation via HHA, and LE progression into and out of bed. Cues for sequencing throughout.    Transfers                   General transfer comment:  nt    Ambulation/Gait                   Stairs             Wheelchair Mobility     Tilt Bed    Modified Rankin (Stroke Patients Only)       Balance Overall balance assessment: Needs assistance Sitting-balance support: Bilateral upper extremity supported, Single extremity supported, Feet supported Sitting balance-Leahy Scale: Fair Sitting balance - Comments: EOB sitting x10 minutes - lateral elbow propping, LE exercise Postural control: Posterior lean                                  Cognition Arousal/Alertness: Awake/alert Behavior During Therapy: WFL for tasks assessed/performed Overall Cognitive Status: Impaired/Different from baseline                               Problem Solving: Difficulty sequencing, Requires verbal cues, Requires tactile cues, Slow processing          Exercises General Exercises - Lower Extremity Hip Flexion/Marching: AROM, Both, 10 reps, Seated Other Exercises Other Exercises: hamstring stretch sit EOB with PT applying gentle overpressure, x30 seconds bilat; seated balance interventions see baalnce section    General Comments General comments (skin integrity, edema, etc.): pt's bed pads wet, PT replaced. PT called RN and wound RN to room to address sacral dressing as it is coming off and daughter is concerned about it  Pertinent Vitals/Pain Pain Assessment Pain Assessment: Faces Faces Pain Scale: Hurts a little bit Pain Location: LEs Pain Descriptors / Indicators: Guarding, Grimacing Pain Intervention(s): Limited activity within patient's tolerance, Monitored during session, Repositioned    Home Living                          Prior Function            PT Goals (current goals can now be found in the care plan section) Acute Rehab PT Goals PT Goal Formulation: With patient Time For Goal Achievement: 09/16/22 Potential to Achieve Goals: Good Progress towards PT goals:  Progressing toward goals    Frequency    Min 1X/week      PT Plan Current plan remains appropriate    Co-evaluation              AM-PAC PT "6 Clicks" Mobility   Outcome Measure  Help needed turning from your back to your side while in a flat bed without using bedrails?: A Lot Help needed moving from lying on your back to sitting on the side of a flat bed without using bedrails?: A Lot Help needed moving to and from a bed to a chair (including a wheelchair)?: A Lot Help needed standing up from a chair using your arms (e.g., wheelchair or bedside chair)?: A Lot Help needed to walk in hospital room?: Total Help needed climbing 3-5 steps with a railing? : Total 6 Click Score: 10    End of Session   Activity Tolerance: Patient tolerated treatment well Patient left: in bed;with call bell/phone within reach;with family/visitor present;with nursing/sitter in room Nurse Communication: Mobility status PT Visit Diagnosis: Other abnormalities of gait and mobility (R26.89);Muscle weakness (generalized) (M62.81)     Time: 6578-4696 PT Time Calculation (min) (ACUTE ONLY): 25 min  Charges:    $Therapeutic Activity: 8-22 mins $Neuromuscular Re-education: 8-22 mins PT General Charges $$ ACUTE PT VISIT: 1 Visit                     Marye Round, PT DPT Acute Rehabilitation Services Secure Chat Preferred  Office [phone]    Sherry [name] 09/09/2022, 10:16 AM

## 2022-09-09 NOTE — Progress Notes (Signed)
Palliative Care  Met with patient, daughter and patient's husband to review  goals of care at the request of Dr. Justin Mend. Reviewed  information discussed during initial GOC discussion with [name] PA.Sherry [name] shared that she understands that her heart is getting weaker but desires to go home with [facility]. Daughter verbalized that they will "try it for 30 days."  [name] MS Ed.S, RN Palliative Medicine Team  Team Phone: [phone]

## 2022-09-09 NOTE — Consult Note (Addendum)
WOC Nurse wound follow up Refer to previous WOC consult notes on 7/14.  Requested by bedside nurse to reassess sacrum.  Pt has previously noted Stage 3 pressure injury; white and moist and macerated with loose darker-colored dry peeling skin surrounding the location. Small amt tan drainage. Right buttock with darker colored dry scab, approx 1X1cm.  Assessed wound appearance with family member at the bedside and discussed plan of care use of  Desitin at home to promote drying and healing.  Dressing procedure/placement/frequency: Continue present plan of care as previously ordered: Wound care to nearly reepithelialized sacral stage 3 pressure injury: Cleanse area with house skin cleanser, pat gently dry. Cover central area only with folded piece of xeroform gauze, top with silicone foam dressing. Change every day and PRN soiling. Please re-consult if further assistance is needed.  Thank-you,  Cammie Mcgee MSN, RN, CWOCN, [place], CNS [phone]

## 2022-09-09 NOTE — Progress Notes (Addendum)
SUBJECTIVE:  Patient Summary: Sherry [name] is a 77 y.o. with a pertinent PMH of HFpEF (60-65% EF), severe AS, mild MR, and CKD 4 who presented for volume overload and directly admitted from [facility] on 7/12 for acute heart failure, AKI on CKD, UTI, and electrolyte disturbances including hypocalcemia.   Overnight Events:  No overnight events. Patient's spouse at bedside this morning. Patient continues to deny CP, N/V, abdominal pain, or SOB. She states she is no longer having pain since foley catheter was removed yesterday. Did not complain about any back pain today. Patient was updated on plan to discharge when IM team and HF team can find a home diuretics dose that is appropriate and nephrology can provide input regarding catheter placement inpatient vs outpatient if a TAVR is being considered.   Palliative team is involved in ongoing GOC conversations as patient decides between going home with [facility] and home hospice.   Interim History:   OBJECTIVE:  Vital Signs: Vitals:   09/09/22 0358 09/09/22 0612 09/09/22 0732 09/09/22 1114  BP: (!) 126/59 125/65 (!) 118/59 129/63  Pulse: 70  78 77  Resp: 17  17 18   Temp: 97.8 F (36.6 C)  98.2 F (36.8 C) 97.8 F (36.6 C)  TempSrc: Oral  Oral Oral  SpO2: 99%  100% 100%  Weight: 80.8 kg     Height:       Supplemental O2: Room Air SpO2: 100 % O2 Flow Rate (L/min): 0 L/min FiO2 (%): (!) 0 %  Filed Weights   09/07/22 0500 09/08/22 0436 09/09/22 0358  Weight: 69 kg 78.8 kg 80.8 kg    Intake/Output Summary (Last 24 hours) at 09/09/2022 1536 Last data filed at 09/09/2022 1400 Gross per 24 hour  Intake 600 ml  Output 950 ml  Net -350 ml   Net IO Since Admission: -17,760.68 mL [09/09/22 1536]     Latest Ref Rng & Units 09/09/2022    1:12 AM 09/08/2022   12:56 AM 09/07/2022    2:42 AM  CBC  WBC 4.0 - 10.5 K/uL 3.4  2.7  2.4   Hemoglobin 12.0 - 15.0 g/dL 7.6  7.5  7.7   Hematocrit 36.0 - 46.0 % 24.8  24.4  24.7   Platelets 150 - 400  K/uL 283  276  285     Renal Functional Panel Component Ref Range & Units 01:12 (09/09/22) 1 d ago (09/08/22) 2 d ago (09/07/22) 3 d ago (09/06/22) 4 d ago (09/05/22) 5 d ago (09/04/22) 5 d ago (09/04/22)  Sodium 135 - 145 mmol/L 133 Low  134 Low  133 Low  133 Low  136 135 137  Potassium 3.5 - 5.1 mmol/L 3.8 4.0 4.1 4.2 4.3 4.4 3.9  Chloride 98 - 111 mmol/L 96 Low  99 96 Low  98 104 104 107  CO2 22 - 32 mmol/L 24 24 24 22 22 21  Low  21 Low   Glucose, Bld 70 - 99 mg/dL 644 High  91 CM 90 CM 034 High  CM 89 CM 160 High  CM 97 CM  Comment: Glucose reference range applies only to samples taken after fasting for at least 8 hours.  BUN 8 - 23 mg/dL 742 High  94 High  89 High  81 High  77 High  76 High  76 High   Creatinine, Ser 0.44 - 1.00 mg/dL 5.95 High  6.38 High  7.56 High  3.27 High  3.25 High  3.42 High  3.19 High  Calcium 8.9 - 10.3 mg/dL 6.3 Low Panic  6.4 Low Panic  CM 6.5 Low  6.2 Low Panic  CM 6.1 Low Panic  CM 6.1 Low Panic  CM 6.1 Low Panic    Ca 6.3 Corrected Ca (using albumin 1.8) 8.1 Bed weight +1.2 kg  Physical Exam: Physical Exam  Constitutional: Sitting up on the edge of the bed eating breakfast with assistance from her husband. Conversing appropriately CV: RRR, systolic murmur RUSB, no rubs or gallops, edema improved b/l upper extremities, 1+ pitting edema b/l lower extremities Pulmonary/Respiratory: On room air, slightly increased work of breathing when speaking and while sitting up/moving around the bed, improved breath sounds  Abdominal: Soft, non-tender, non-distended Neuro: Alert and oriented Psych: Normal mood and affect Skin: Warm and dry  Patient Lines/Drains/Airways Status     Active Line/Drains/Airways     Name Placement date Placement time Site Days   Peripheral IV 08/30/22 22 G 1.75" Right Antecubital 08/30/22  1517  Antecubital  3   Urethral Catheter Monica Sixtos RN Straight-tip 14 Fr. 09/02/22  1029  Straight-tip  less than 1   Pressure Injury  08/30/22 Sacrum Mid 08/30/22  1430  -- 3   Wound / Incision (Open or Dehisced) 08/30/22 Incision - Open;Skin tear Pelvis Anterior;Right 08/30/22  1430  Pelvis  3            ASSESSMENT/PLAN:  Assessment: Principal Problem:   Acute diastolic congestive heart failure (HCC) Active Problems:   Essential hypertension   Acute kidney injury superimposed on CKD (HCC)   Severe aortic stenosis   UTI (urinary tract infection)   Hypocalcemia  Acute on Chronic Diastolic Heart Failure Severe AS, non rheumatic  Severe LVH, ?Cardiac amyloid History of 2:1 AV block  Repeat TTE this admission with LVEF of 60-65% with normal LV function, similar to 10/2021 echo. Aortic valve area 0.62, mean gradient of 50 up 29.8 from September 2023 echo.   Patient follows with cardiology outpatient (Dr. Excell Seltzer). Consulted cardiology 7/15, started following patient 7/16. Etiology of acute HF likely due to inadequate oral diuretic dose, progressive CKD and worsening aortic stenosis. Dr. Shirlee Latch and Dr. Gala Romney both have discussed TAVR, benefits and risks, and alternative treatment including no procedures and opting for hospice care. Palliative team has been involved and has helped family with their decision. For today, family wishes to try going home with home health for a month before revisiting home hospice or hospice care at a facility.   Stopped IV lasix drip 12 mg/hr 7/20; PO torsemide 60 7/20-7/22; Torsemide 60 given 7/22 then held by HF team. No fluid restriction for today, encouraged PO intake per Dr. Prescott Gum note. Restarted Jardiance 7/19.   Foley catheter removed 7/21 with good output, although patient and nursing reported incontinence as patient is unable to determine when she starts urinating and it's difficult to capture output with external catheter. Post-void bladder scan order to r/o retention but nursing reported difficulty obtaining as patient often goes on herself without knowing. Nursing enlisted the  help of family to know when to get a bladder scan.   Plan:  - Hold torsemide 60 - Continue Jardiance 10 mg daily - Telemetry  - Daily weights - f/u post-void bladder scan - Renal diet + feeding supplement, following RD recommendations - Incentive spirometer and 4 bronchodilator nebulized treatments - Monitor electrolytes daily    AKI on CKD IV Hyperphosphatemia Current presentation concerning for progression of CKD vs congestion in setting of acute HF vs AKI in  setting of intravascular depletion 2/2 anasarca vs UTI (see below). No improvement in Cr or GFR on BID 80 mg lasix; some improvement on IV lasix drip; Cr bumped up on PO torsemide 60. Hyperphosphatemia most likely related to CKD. Tried consulting nephrology 7/22 to get their thoughts on outpatient follow-up and options for placing a catheter/fistula inpatient vs outpatient if patient chooses to pursue TAVR.   Phosphorus persistently elevated, 6.3 today.   Plan:  - Continue external catheter, f/u post-void [place] - Monitor RFP - Avoid nephrotoxic medications - Renal diet, pause fluid restriction 7/22 - F/u on RD recommendations - Renvela 800 TID  Hypocalcemia Secondary Hyperparathyroidism  Mild vitamin D deficiency PTH 185 and persistently low Ca in line with secondary hyperparathyroidism associated with her CKD. Today Ca 6.4, corrected Ca (using albumin 1.9) 8.1, No overt telemetry changes. Patient remains asymptomatic without major changes on telemetry. Received calcium carbonate TID and Vitamin D 1000 international units daily until 7/18 with minimal improvement to her calcium levels. Completed IV calcium gluconate 1g (7/18) 2g (7/19).  Plan:  - Continue calcium tablet 1250 TID - Vitamin D 1.25 mg (50000 international units) weekly - Monitor on RFP   Small to Moderate Pericardial Effusion Mentioned on repeat echo; suspect this is due to volume overload in the setting of acute on chronic CHF and advanced CKD V.   Acute on  chronic normocytic anemia Hgb today 7.6, 7.5 yesterday (range 7.3-7.8). Chronic anemia likely in setting of chronic disease/stage IV CKD. Patient has history of hemorrhoids and experiences intermittent straining. No overt signs of bleeding overnight. Last colonoscopy in 2008. Given history of severe AS, question possible AVMs. Will continue to monitor at this time. -Monitor CBC -Transfuse for Hgb less than 7    Pressure wounds Pictures in media tab. Bariatric bed, lidocaine, and muscle rub CREA topical around (not on) back area have helped with pain.   Wound care came 7/22 to reassess sacral pressure injury. Cammie Mcgee (WOC Nurse) assessed wound appearance with family member at the bedside and discussed plan of care: Desitin to promote drying and healing. Cleanse area with house skin cleanser, pat gently dry. Cover central area only with folded piece of xeroform gauze, top with silicone foam dressing. Change every day and PRN soiling.    Plan:  - Wound care supplies given to family - Continue lidocaine 4% cream PRN  - Muscle rub CREA topical PRN for muscle pain    Nonbleeding external hemorrhoids with mucosal prolapse Constipation Patient denied pain today. Received a one-time dose of sorbitol on 7/14. Plan: -Scheduled MiraLAX daily and senna S2 tabs twice daily -Monitor for signs of bleeding and discomfort   UTI, resolved Diagnosed outpatient with improvement after starting Keflex therapy on 7/9. Patient completed 5 days of UTI therapy with Cephalexin 500 mg BID on 7/14. Foley catheter removed 7/21 due to pain around site and length of time with foley in. Reported urinary incontinence and is using external catheter.  Plan:  - f/u post-void scan - Monitor  Subclinical hypothyroidism Elevated TSH 6.844 (April TSH 4.009), Free T4 0.81, likely in the setting of acute illness. -Outpatient follow up  Chronic health conditions Seizure disorder- continue phenobarbital 129.6 mg,  zonisamide/zonegran 200 mg daily; normal phenobarbital levels Chronic pain/osteoarthritis- Tylenol 650 q6h PRN  Asthma- albuterol PRN  CVA- aspirin 81 daily  GERD pantoprazole 40 mg CAD- rosuvastatin 40 mg Osteoporosis- denosumab injection every 6 months DISCONTINUE, patient intolerance  Best Practice: Diet: Renal diet VTE: Place TED  hose Start: 09/09/22 1203 enoxaparin (LOVENOX) injection 30 mg Start: 08/30/22 1515 Code: DNR  Prior to Admission Living Arrangement: Home with spouse Anticipated Discharge Location: Pending PT evaluation Barriers to Discharge: Medical management.  Dispo: Anticipated discharge in approximately 1-2 day(s).   Signature: Laurence Folz Colbert Coyer, MD Internal Medicine Resident, PGY-1 [facility]  Pager: @MYPAGER @ 3:36 PM, 09/09/2022   Please contact the on call pager after 5 pm and on weekends at [phone].

## 2022-09-09 NOTE — Progress Notes (Signed)
Per MD order and after speaking with Case Management, bed pads and wound supplies provided to pt for home use.  Pt to have HH and will have additional supplies provided at that time.

## 2022-09-09 NOTE — TOC Progression Note (Signed)
Transition of Care [facility]) - Progression Note    Patient Details  Name: Sherry [name] MRN: [id number] Date of Birth: 16-Jan-1946  Transition of Care [facility]) CM/SW Contact  Elliot Cousin, RN Phone Number: [phone] 09/09/2022, 12:17 PM  Clinical Narrative:   Spoke to pt, husband and dtr at bedside. States they want ambulance transport home. Contacted [employer] rep, Kelly for [employer] home today with HH. Contacted [employer] rep, Pension scheme manager for [employer]. Provided pt's dtr with information on how to purchase purwick system for her to use at home. Expalined system is not a covered Medicare product.     Expected Discharge Plan: Home w Home Health Services Barriers to Discharge: No Barriers Identified  Expected Discharge Plan and Services In-house Referral: Clinical Social Work Discharge Planning Services: CM Consult Post Acute Care Choice: Home Health Living arrangements for the past 2 months: Single Family Home                 DME Arranged: Other see comment DME Agency: [employer] Date DME Agency Contacted: 09/09/22 Time DME Agency Contacted: 1203 Representative spoke with at DME Agency: Vonna Kotyk HH Arranged: RN, PT [facility] Agency: [facility] Date [facility] Agency Contacted: 09/09/22 Time HH Agency Contacted: 1216 Representative spoke with at [place] Agency: Hassel Neth   Social Determinants of Health (SDOH) Interventions SDOH Screenings   Food Insecurity: No Food Insecurity (08/30/2022)  Housing: Patient Declined (08/30/2022)  Transportation Needs: No Transportation Needs (08/30/2022)  Utilities: Not At Risk (08/30/2022)  Depression (PHQ2-9): Low Risk  (06/14/2022)  Physical Activity: Inactive (10/17/2021)  Stress: No Stress Concern Present (10/17/2021)  Tobacco Use: Medium Risk (08/30/2022)    Readmission Risk Interventions     No data to display

## 2022-09-09 NOTE — Progress Notes (Addendum)
Advanced Heart Failure Rounding Note  PCP-Cardiologist: Tonny Bollman, MD   Subjective:     Decreased urine output and worsening renal function. 1.2 L UOP and net -500 mL yesterday with Cr 3.4->3.7 and BUN 103. No change in symptoms, improved edema and less JVD. Discussed poor prognosis w/wo TAVR. Discussed hospice care and patient is familiar. Daughter and husband in the room. No decision to change care yet.    Objective:   Weight Range: 80.8 kg Body mass index is 33.66 kg/m.   Vital Signs:   Temp:  [97.8 F (36.6 C)-98.5 F (36.9 C)] 98.2 F (36.8 C) (07/22 0732) Pulse Rate:  [70-94] 78 (07/22 0732) Resp:  [17-20] 17 (07/22 0732) BP: (101-126)/(59-94) 118/59 (07/22 0732) SpO2:  [98 %-100 %] 100 % (07/22 0732) Weight:  [80.8 kg] 80.8 kg (07/22 0358) Last BM Date : 09/08/22  Weight change: Filed Weights   09/07/22 0500 09/08/22 0436 09/09/22 0358  Weight: 69 kg 78.8 kg 80.8 kg    Intake/Output:   Intake/Output Summary (Last 24 hours) at 09/09/2022 0911 Last data filed at 09/09/2022 0834 Gross per 24 hour  Intake 537 ml  Output 1200 ml  Net -663 ml      Physical Exam    General:  Well appearing. No resp difficulty Neck: supple. JVD minimal.  Cor: RRR with PVCs. Loud systolic murmur over RUSB. Lungs: crackles and wheezing throughout posterior lung fields  Abdomen: soft, nontender, nondistended. Extremities: stable 1+ pitting edema in the upper and lower extremities bilaterally worse on the right leg Neuro: alert & orientedx3   Telemetry   1st and occasional 2nd degree AV block with frequent PVCs   EKG    No new EKGs  Labs    CBC Recent Labs    09/08/22 0056 09/09/22 0112  WBC 2.7* 3.4*  HGB 7.5* 7.6*  HCT 24.4* 24.8*  MCV 89.7 89.2  PLT 276 283   Basic Metabolic Panel Recent Labs    16/10/96 0056 09/09/22 0112  NA 134* 133*  K 4.0 3.8  CL 99 96*  CO2 24 24  GLUCOSE 91 103*  BUN 94* 103*  CREATININE 3.40* 3.70*  CALCIUM 6.4*  6.3*  PHOS 5.6* 6.3*   Liver Function Tests Recent Labs    09/08/22 0056 09/09/22 0112  ALBUMIN 1.9* 1.8*   No results for input(s): "LIPASE", "AMYLASE" in the last 72 hours. Cardiac Enzymes No results for input(s): "CKTOTAL", "CKMB", "CKMBINDEX", "TROPONINI" in the last 72 hours.  BNP: BNP (last 3 results) Recent Labs    06/14/22 1152 08/27/22 1200 08/30/22 1130  BNP 711.2* 2,010.8* 2,133.4*    ProBNP (last 3 results) No results for input(s): "PROBNP" in the last 8760 hours.   D-Dimer No results for input(s): "DDIMER" in the last 72 hours. Hemoglobin A1C No results for input(s): "HGBA1C" in the last 72 hours. Fasting Lipid Panel No results for input(s): "CHOL", "HDL", "LDLCALC", "TRIG", "CHOLHDL", "LDLDIRECT" in the last 72 hours. Thyroid Function Tests No results for input(s): "TSH", "T4TOTAL", "T3FREE", "THYROIDAB" in the last 72 hours.  Invalid input(s): "FREET3"  Other results:   Imaging    No results found.   Medications:     Scheduled Medications:  aspirin  81 mg Oral Daily   calcium carbonate  500 mg of elemental calcium Oral TID PC & HS   empagliflozin  10 mg Oral Daily   enoxaparin (LOVENOX) injection  30 mg Subcutaneous Q24H   hydrALAZINE  75 mg Oral Q8H  lactose free nutrition  237 mL Oral BID BM   multivitamin  1 tablet Oral QHS   pantoprazole  40 mg Oral Daily   PHENobarbital  129.6 mg Oral QHS   polyethylene glycol  17 g Oral Daily   rosuvastatin  40 mg Oral Daily   senna-docusate  2 tablet Oral BID   sevelamer carbonate  800 mg Oral TID WC   torsemide  60 mg Oral Daily   [START ON 09/14/2022] Vitamin D (Ergocalciferol)  50,000 Units Oral Q7 days   zonisamide  200 mg Oral QHS    Infusions:   PRN Medications: acetaminophen **OR** acetaminophen, albuterol, lidocaine, Muscle Rub, phenylephrine-shark liver oil-mineral oil-petrolatum    Patient Profile   77 y/o female w/ PMH of severe AS, HFpEF, CAD, HTN, CKD stage 4, and hx of  AV block who presented with volume overload, bilateral pleural effusions, and AKI on CKD 4.   Assessment/Plan   1. Aortic stenosis: 12/21 LHC/RHC and echo were suggestive of moderate aortic stenosis.  Repeat echo in 11/22 showed paradoxical low flow/low gradient severe aortic stenosis, echo in 9/23 was similar.  Echo this admission with severe AS, gradient higher.  She has been seen by Dr. Excell Seltzer in the past for TAVR evaluation.  She is very limited in terms of activity, basically wheel-chair bound at baseline. She has a long 1st degree AVB and has episodes of type 1 2nd degree AVB.  She also has CKD stage IV.  TAVR could be done, but high risk for progression to ESRD and need for pacemaker.  She would have a hard time with HD due to lack of mobility.  Echo this admission with severe AS, mean gradient worsened to 50 mmHg.  She is more symptomatic.  - I think she would be a very marginal TAVR candidate, morbidity increasing with worsening renal function.  Discussed poor prognosis and lack of further options. Palliative care on board. Hospice discussed and family is discussing. - we will continue with plan to find stable oral diuresis  2. CAD: Moderate-severe diffuse CAD on coronary angiography in 4/15. She would not be a candidate for CABG due to lack of mobility. She has occasional chest discomfort with activities but this is not prominent.   - Continue ASA 81.  - Continue Crestor. - If we were to pursue TAVR, would need coronary angiography.  This would be difficult with CKD stage IV.  3. AKI on CKD stage IV: Baseline creatinine around 2.5+, 3.27 => 3.34 => 3.7 today.   - received am dose of torsemide/empagliflozin, hold further diuretics, monitor urine output and BMP 4. Acute on chronic diastolic CHF: Cardiac MRI was concerning for cardiac amyloidosis (11/19), as was echo with moderate LVH and aortic stenosis. The aortic stenosis and the pericardial effusion seen by echo are also frequently seen with  cardiac amyloidosis. She additionally has tingling/numbness in hands/feet that is consistent with peripheral neuropathy.  However, PYP scan in 11/19 was negative and myeloma workup was negative.  Repeat PYP scan in 11/22 was again negative and she did not have a transthyretin gene mutation on [facility] gene testing.  Myeloma workup was again negative in 10/22.  Therefore, cardiac amyloidosis seems unlikely and LVH may be due to HTN and aortic stenosis though delayed enhancement from MRI is more suggestive of infiltrative disease. Echo this admission shows EF 60-65%, severe LVH, normal RV, small to moderate pericardial effusion (unchanged), severe AS with mean gradient 50 mmHg (AS has been severe in past  but mean gradient has progressed).  She reports dyspnea x 2 wks and has significant volume overload on exam, complicated by CKD stage IV.  I suspect that her CHF is driven by severe AS.  She diuresed well with IV Lasix and PO torsemide initially but now not responding as well. Possibly overdiuresed and 60 mg torsemide is too high, UOP decreased, and creatine continues to rise, 3.7 today. Suspect some edema is venous insufficiency and will add TED hose. - Can continue Jardiance.  - hold torsemide 60 mg daily, repeat BMP in the morning and encouraged oral PO intake today(no change to restriction)  - No ARB/ARNi or spiro with CKD 5. HTN: BP controlled on hydralazine 75 mg tid.  6. Conduction abnormality: Long 1st degree AVB, also runs of type 1 2nd degree AVB have been noted.   - Avoid beta blockade.  - High risk for progressive HB with TAVR, would likely end up with PPM. 7. Debility: Minimally mobile.  Has not been out of bed.  This is her baseline.  8. GOC: Pt with worsening severe AS, conduction delay, diastolic CHF, known CAD medically managed and likely progressed, worsening CKD now stage IV/V, and minimal mobility. High risk of continued volume overload and ESRD without TAVR and high risk of ESRD and post  TAVR heart block needing pacemaker with TAVR. I do not think she would do well on dialysis due to limited mobility.  - palliative care on board, hospice discussed today, family and patient aware of poor prognosis, no decision yet - continue to discuss GOC  - appreciate primary and palliative teams' management   Length of Stay: 9  Rocky Morel, DO  09/09/2022, 9:11 AM  Advanced Heart Failure Team Pager [phone] (M-F; 7a - 5p)  Please contact [facility] for night-coverage after hours (5p -7a ) and weekends on [email or web address]  Patient seen and examined with the above-signed Advanced Practice Provider and/or Housestaff. I personally reviewed laboratory data, imaging studies and relevant notes. I independently examined the patient and formulated the important aspects of the plan. I have edited the note to reflect any of my changes or salient points. I have personally discussed the plan with the patient and/or family.  Feels fine. Denies CP, SOB, orthopnea or PND. Says fluid is better.   General:  Elderly woman lying in bed  No resp difficulty HEENT: normal Neck: supple. no JVD. Carotids 2+ bilat; + bruits. No lymphadenopathy or thryomegaly appreciated. Cor: PMI nondisplaced. Regular rate & rhythm.3/6 AS s2 obliterated Lungs: clear Abdomen: soft, nontender, nondistended. No hepatosplenomegaly. No bruits or masses. Good bowel sounds. Extremities: no cyanosis, clubbing, rash,  tr-1+ edema on R  Neuro: alert & orientedx3, cranial nerves grossly intact. moves all 4 extremities w/o difficulty. Affect pleasant  Overall stable. Bun/Cr up a bit - suspect overdiuresis. Will hold torsemide (got am dose already) and encourage po intake.   Case reviewed with Dr. Excell Seltzer. With non-ambulatory status, lack of CP/SOB and CKD IV-V suspect the risk of TAVR much greater than the potential benefit.   Once Scr improved can d/c home. She understands that she may need Hospice in the near future and agrees to cross  that bridge when she gets there.   D/w her husband and daughter at bedside.   Would d/c on torsemide 40 daily when she is ready for d/c.   Arvilla Meres, MD  1:34 PM

## 2022-09-09 NOTE — Progress Notes (Signed)
Order to bladder scan for post void residual - pt incontinent and unable to tell when she voids.  Pt with external urinary catheter.  Spouse at bedside.  RN asked both pt and spouse to monitor external catheter urine canister and alert staff when pt voids.  MD notified.

## 2022-09-09 NOTE — Plan of Care (Signed)
  Problem: Education: Goal: Knowledge of General Education information will improve Description Including pain rating scale, medication(s)/side effects and non-pharmacologic comfort measures Outcome: Progressing   Problem: Health Behavior/Discharge Planning: Goal: Ability to manage health-related needs will improve Outcome: Progressing   Problem: Clinical Measurements: Goal: Ability to maintain clinical measurements within normal limits will improve Outcome: Progressing Goal: Diagnostic test results will improve Outcome: Progressing Goal: Respiratory complications will improve Outcome: Progressing Goal: Cardiovascular complication will be avoided Outcome: Progressing   Problem: Activity: Goal: Risk for activity intolerance will decrease Outcome: Progressing   Problem: Nutrition: Goal: Adequate nutrition will be maintained Outcome: Progressing   Problem: Coping: Goal: Level of anxiety will decrease Outcome: Progressing   Problem: Elimination: Goal: Will not experience complications related to bowel motility Outcome: Progressing Goal: Will not experience complications related to urinary retention Outcome: Progressing   Problem: Pain Managment: Goal: General experience of comfort will improve Outcome: Progressing   Problem: Safety: Goal: Ability to remain free from injury will improve Outcome: Progressing   Problem: Skin Integrity: Goal: Risk for impaired skin integrity will decrease Outcome: Progressing   Problem: Education: Goal: Ability to demonstrate management of disease process will improve Outcome: Progressing Goal: Ability to verbalize understanding of medication therapies will improve Outcome: Progressing Goal: Individualized Educational Video(s) Outcome: Progressing   Problem: Activity: Goal: Capacity to carry out activities will improve Outcome: Progressing   Problem: Cardiac: Goal: Ability to achieve and maintain adequate cardiopulmonary  perfusion will improve Outcome: Progressing

## 2022-09-10 ENCOUNTER — Other Ambulatory Visit (HOSPITAL_COMMUNITY): Payer: Self-pay

## 2022-09-10 DIAGNOSIS — N184 Chronic kidney disease, stage 4 (severe): Secondary | ICD-10-CM | POA: Diagnosis not present

## 2022-09-10 DIAGNOSIS — I5033 Acute on chronic diastolic (congestive) heart failure: Secondary | ICD-10-CM | POA: Diagnosis not present

## 2022-09-10 DIAGNOSIS — N179 Acute kidney failure, unspecified: Secondary | ICD-10-CM | POA: Diagnosis not present

## 2022-09-10 DIAGNOSIS — I5031 Acute diastolic (congestive) heart failure: Secondary | ICD-10-CM | POA: Diagnosis not present

## 2022-09-10 DIAGNOSIS — J90 Pleural effusion, not elsewhere classified: Secondary | ICD-10-CM | POA: Diagnosis not present

## 2022-09-10 LAB — CBC
HCT: 23.5 % — ABNORMAL LOW (ref 36.0–46.0)
Hemoglobin: 7.3 g/dL — ABNORMAL LOW (ref 12.0–15.0)
MCH: 28.2 pg (ref 26.0–34.0)
MCHC: 31.1 g/dL (ref 30.0–36.0)
MCV: 90.7 fL (ref 80.0–100.0)
Platelets: 260 10*3/uL (ref 150–400)
RBC: 2.59 MIL/uL — ABNORMAL LOW (ref 3.87–5.11)
RDW: 14.4 % (ref 11.5–15.5)
WBC: 2 10*3/uL — ABNORMAL LOW (ref 4.0–10.5)
nRBC: 0 % (ref 0.0–0.2)

## 2022-09-10 LAB — GLUCOSE, CAPILLARY: Glucose-Capillary: 81 mg/dL (ref 70–99)

## 2022-09-10 LAB — RENAL FUNCTION PANEL
Albumin: 2 g/dL — ABNORMAL LOW (ref 3.5–5.0)
Anion gap: 17 — ABNORMAL HIGH (ref 5–15)
BUN: 103 mg/dL — ABNORMAL HIGH (ref 8–23)
CO2: 22 mmol/L (ref 22–32)
Calcium: 6.3 mg/dL — CL (ref 8.9–10.3)
Chloride: 97 mmol/L — ABNORMAL LOW (ref 98–111)
Creatinine, Ser: 3.44 mg/dL — ABNORMAL HIGH (ref 0.44–1.00)
GFR, Estimated: 13 mL/min — ABNORMAL LOW (ref >60)
Glucose, Bld: 95 mg/dL (ref 70–99)
Phosphorus: 6.2 mg/dL — ABNORMAL HIGH (ref 2.5–4.6)
Potassium: 3.4 mmol/L — ABNORMAL LOW (ref 3.5–5.1)
Sodium: 136 mmol/L (ref 135–145)

## 2022-09-10 MED ORDER — TORSEMIDE 20 MG PO TABS: 40.0000 mg | ORAL_TABLET | Freq: Every day | ORAL | Status: DC | PRN

## 2022-09-10 MED ORDER — EMPAGLIFLOZIN 10 MG PO TABS
10.0000 mg | ORAL_TABLET | Freq: Every day | ORAL | 0 refills | Status: DC
  Filled 2022-09-10: qty 30, 30d supply, fill #0

## 2022-09-10 MED ORDER — TORSEMIDE 20 MG PO TABS
40.0000 mg | ORAL_TABLET | Freq: Every day | ORAL | Status: DC
  Administered 2022-09-10: 40 mg via ORAL
  Filled 2022-09-10: qty 2

## 2022-09-10 MED ORDER — CALCIUM CARBONATE 1500 (600 CA) MG PO TABS
1.0000 | ORAL_TABLET | Freq: Three times a day (TID) | ORAL | 0 refills | Status: DC
  Filled 2022-09-10 (×2): qty 90, 30d supply, fill #0

## 2022-09-10 MED ORDER — ASPIRIN 81 MG PO CHEW
81.0000 mg | CHEWABLE_TABLET | Freq: Every day | ORAL | 0 refills | Status: DC
  Filled 2022-09-10: qty 90, 90d supply, fill #0

## 2022-09-10 MED ORDER — ACETAMINOPHEN 325 MG PO TABS
650.0000 mg | ORAL_TABLET | Freq: Four times a day (QID) | ORAL | 0 refills | Status: DC | PRN
  Filled 2022-09-10: qty 100, 13d supply, fill #0

## 2022-09-10 MED ORDER — RENA-VITE PO TABS
1.0000 | ORAL_TABLET | Freq: Every day | ORAL | 0 refills | Status: DC
  Filled 2022-09-10: qty 100, 100d supply, fill #0

## 2022-09-10 MED ORDER — POTASSIUM CHLORIDE CRYS ER 10 MEQ PO TBCR
10.0000 meq | EXTENDED_RELEASE_TABLET | Freq: Every day | ORAL | 0 refills | Status: DC
Start: 2022-09-10 — End: 2022-10-04
  Filled 2022-09-10: qty 30, 30d supply, fill #0

## 2022-09-10 MED ORDER — TORSEMIDE 20 MG PO TABS
ORAL_TABLET | ORAL | 2 refills | Status: DC
  Filled 2022-09-10: qty 120, 30d supply, fill #0
  Filled 2022-10-04 – 2022-10-08 (×2): qty 120, 30d supply, fill #1

## 2022-09-10 MED ORDER — LIDOCAINE 4 % EX CREA
1.0000 | TOPICAL_CREAM | Freq: Two times a day (BID) | CUTANEOUS | 0 refills | Status: DC | PRN
Start: 2022-09-10 — End: 2023-06-25
  Filled 2022-09-10: qty 30, fill #0
  Filled 2022-10-04: qty 30, 30d supply, fill #0

## 2022-09-10 MED ORDER — SEVELAMER CARBONATE 800 MG PO TABS
800.0000 mg | ORAL_TABLET | Freq: Three times a day (TID) | ORAL | 0 refills | Status: DC
  Filled 2022-09-10: qty 90, 30d supply, fill #0

## 2022-09-10 MED ORDER — ALBUTEROL SULFATE (2.5 MG/3ML) 0.083% IN NEBU
2.5000 mg | INHALATION_SOLUTION | Freq: Four times a day (QID) | RESPIRATORY_TRACT | 12 refills | Status: DC | PRN
  Filled 2022-09-10: qty 90, 8d supply, fill #0
  Filled 2022-10-04: qty 90, 8d supply, fill #1

## 2022-09-10 MED ORDER — POTASSIUM CHLORIDE CRYS ER 20 MEQ PO TBCR
40.0000 meq | EXTENDED_RELEASE_TABLET | Freq: Two times a day (BID) | ORAL | Status: DC
  Administered 2022-09-10: 40 meq via ORAL
  Filled 2022-09-10: qty 2

## 2022-09-10 MED ORDER — VITAMIN D (ERGOCALCIFEROL) 1.25 MG (50000 UNIT) PO CAPS
50000.0000 [IU] | ORAL_CAPSULE | ORAL | 0 refills | Status: DC
  Filled 2022-09-10: qty 5, 35d supply, fill #0

## 2022-09-10 MED ORDER — HYDRALAZINE HCL 25 MG PO TABS
75.0000 mg | ORAL_TABLET | Freq: Three times a day (TID) | ORAL | 0 refills | Status: DC
  Filled 2022-09-10: qty 270, 30d supply, fill #0

## 2022-09-10 NOTE — Progress Notes (Signed)
Nutrition Follow-up  DOCUMENTATION CODES:   Not applicable  INTERVENTION:  Continue 2 gram sodium diet Encourage ongoing adequate PO intake with emphasis on protein consumption with meals and snacks Discontinue Boost Plus Continue Renal MVI with minerals daily  NUTRITION DIAGNOSIS:   Increased nutrient needs related to acute illness as evidenced by estimated needs. - remains applicable  GOAL:   Patient will meet greater than or equal to 90% of their needs - progressing  MONITOR:   PO intake, Supplement acceptance, Labs, Weight trends, I & O's, Skin  REASON FOR ASSESSMENT:   Consult Assessment of nutrition requirement/status (Lactose intolerant, on renal diet)  ASSESSMENT:   Pt admitted from [facility] d/t severe volume overload with diffuse edema. PMH significant for HFpEF, severe AS and mild MR, CKD IV.  Pt found not to be candidate for TAVR. GOC discussion ongoing. Plans to d/c this afternoon pending adjustment in diuretic.   Pt reports eating "too well." She is eating a good variety of food items. She was trying to consume Boost Plus supplements but feels as though they were increasing her loose stools. Will d/c order as pt is eating well.    Average meal completions 91% x8 recorded meals (07/21-07/23)  Admit weight: 83 kg Current weight: 77.6 kg  Medications: calcium carbonate, jardiance, rena-vit, protonix, miralax, klor-con, torsemide, Vitamin d every 7 days  Labs: potassium 3.4, BUN 103, Cr 3.44, corrected calcium 7.9, anion gap 17, phos 6.2, GFR 13  Diet Order:   Diet Order             Diet 2 gram sodium Room service appropriate? Yes; Fluid consistency: Thin; Fluid restriction: 1200 mL Fluid  Diet effective now                   EDUCATION NEEDS:   Education needs have been addressed  Skin:  Skin Assessment: Skin Integrity Issues: Skin Integrity Issues:: Other (Comment) Other: PI mid sacrum; skin tear R pelvis  Last BM:  7/21 (type 5  large)  Height:   Ht Readings from Last 1 Encounters:  09/06/22 5\' 1"  (1.549 m)    Weight:   Wt Readings from Last 1 Encounters:  09/10/22 77.6 kg    Ideal Body Weight:  47.7 kg  BMI:  Body mass index is 32.32 kg/m.  Estimated Nutritional Needs:   Kcal:  1300-1500  Protein:  65-80g  Fluid:  1.3-1.5L  Drusilla Kanner, RDN, LDN Clinical Nutrition

## 2022-09-10 NOTE — Progress Notes (Addendum)
Advanced Heart Failure Rounding Note  PCP-Cardiologist: Tonny Bollman, MD   Subjective:     Stable this morning. No issues overnight. Doing well with compression socks. 1.1 L urine output after torsemide 60 mg PO yesterday, net negative 550 mL. Cr improved from 3.7 to 3.44 and BUN stable at 103.    Objective:   Weight Range: 77.6 kg Body mass index is 32.32 kg/m.   Vital Signs:   Temp:  [97.5 F (36.4 C)-98.2 F (36.8 C)] 97.5 F (36.4 C) (07/23 0413) Pulse Rate:  [70-78] 73 (07/23 0413) Resp:  [15-18] 16 (07/23 0413) BP: (118-146)/(59-84) 120/84 (07/23 0413) SpO2:  [96 %-100 %] 98 % (07/23 0413) Weight:  [77.6 kg] 77.6 kg (07/23 0413) Last BM Date : 09/08/22  Weight change: Filed Weights   09/08/22 0436 09/09/22 0358 09/10/22 0413  Weight: 78.8 kg 80.8 kg 77.6 kg    Intake/Output:   Intake/Output Summary (Last 24 hours) at 09/10/2022 0706 Last data filed at 09/10/2022 0414 Gross per 24 hour  Intake 600 ml  Output 1150 ml  Net -550 ml      Physical Exam    General:  Well appearing. No resp difficulty Neck: supple. No JVD  Cor: RRR with PVCs. Loud systolic murmur over RUSB. Lungs: minimal crackles in dependent lung Abdomen: soft, nontender, nondistended. Extremities: stable 1+ pitting edema in the upper and lower extremities bilaterally worse on the right leg Neuro: alert & orientedx3   Telemetry   Rate 65, SR, intermittent sinus pauses, 1st and occasional 2nd degree AV block  EKG    No new EKGs  Labs    CBC Recent Labs    09/09/22 0112 09/10/22 0217  WBC 3.4* 2.0*  HGB 7.6* 7.3*  HCT 24.8* 23.5*  MCV 89.2 90.7  PLT 283 260   Basic Metabolic Panel Recent Labs    16/10/96 0112 09/10/22 0217  NA 133* 136  K 3.8 3.4*  CL 96* 97*  CO2 24 22  GLUCOSE 103* 95  BUN 103* 103*  CREATININE 3.70* 3.44*  CALCIUM 6.3* 6.3*  PHOS 6.3* 6.2*   Liver Function Tests Recent Labs    09/09/22 0112 09/10/22 0217  ALBUMIN 1.8* 2.0*   No  results for input(s): "LIPASE", "AMYLASE" in the last 72 hours. Cardiac Enzymes No results for input(s): "CKTOTAL", "CKMB", "CKMBINDEX", "TROPONINI" in the last 72 hours.  BNP: BNP (last 3 results) Recent Labs    06/14/22 1152 08/27/22 1200 08/30/22 1130  BNP 711.2* 2,010.8* 2,133.4*    ProBNP (last 3 results) No results for input(s): "PROBNP" in the last 8760 hours.   D-Dimer No results for input(s): "DDIMER" in the last 72 hours. Hemoglobin A1C No results for input(s): "HGBA1C" in the last 72 hours. Fasting Lipid Panel No results for input(s): "CHOL", "HDL", "LDLCALC", "TRIG", "CHOLHDL", "LDLDIRECT" in the last 72 hours. Thyroid Function Tests No results for input(s): "TSH", "T4TOTAL", "T3FREE", "THYROIDAB" in the last 72 hours.  Invalid input(s): "FREET3"  Other results:   Imaging    No results found.   Medications:     Scheduled Medications:  aspirin  81 mg Oral Daily   calcium carbonate  500 mg of elemental calcium Oral TID PC & HS   empagliflozin  10 mg Oral Daily   enoxaparin (LOVENOX) injection  30 mg Subcutaneous Q24H   hydrALAZINE  75 mg Oral Q8H   lactose free nutrition  237 mL Oral BID BM   multivitamin  1 tablet Oral QHS  pantoprazole  40 mg Oral Daily   PHENobarbital  129.6 mg Oral QHS   polyethylene glycol  17 g Oral Daily   rosuvastatin  40 mg Oral Daily   senna-docusate  2 tablet Oral BID   sevelamer carbonate  800 mg Oral TID WC   [START ON 09/14/2022] Vitamin D (Ergocalciferol)  50,000 Units Oral Q7 days   zonisamide  200 mg Oral QHS    Infusions:   PRN Medications: acetaminophen **OR** acetaminophen, albuterol, lidocaine, Muscle Rub, phenylephrine-shark liver oil-mineral oil-petrolatum    Patient Profile   77 y/o female w/ PMH of severe AS, HFpEF, CAD, HTN, CKD stage 4, and hx of AV block who presented with volume overload, bilateral pleural effusions, and AKI on CKD 4.   Assessment/Plan   1. Aortic stenosis: 12/21 LHC/RHC  and echo were suggestive of moderate aortic stenosis.  Repeat echo in 11/22 showed paradoxical low flow/low gradient severe aortic stenosis, echo in 9/23 was similar.  Echo this admission with severe AS, gradient higher.  She has been seen by Dr. Excell Seltzer in the past for TAVR evaluation.  She is very limited in terms of activity, basically wheel-chair bound at baseline. She has a long 1st degree AVB and has episodes of type 1 2nd degree AVB.  She also has CKD stage IV.  TAVR could be done, but high risk for progression to ESRD and need for pacemaker.  She would have a hard time with HD due to lack of mobility.  Echo this admission with severe AS, mean gradient worsened to 50 mmHg.  She is more symptomatic.  - Not a TAVR candidate, discussed with Dr. Excell Seltzer.  2. CAD: Moderate-severe diffuse CAD on coronary angiography in 4/15. She would not be a candidate for CABG due to lack of mobility. She has occasional chest discomfort with activities but this is not prominent.   - Continue ASA 81.  - Continue Crestor. - If we were to pursue TAVR, would need coronary angiography.  This would be difficult with CKD stage IV.  3. AKI on CKD stage IV: Baseline creatinine around 2.5+, likely new baseline 3.4, 3.27 => 3.34 => 3.7 => 3.44 today despite 60 mg torsemide given yesterday - follow up with PCP and [facility] outpatient  4. Acute on chronic diastolic CHF: Cardiac MRI was concerning for cardiac amyloidosis (11/19), as was echo with moderate LVH and aortic stenosis. The aortic stenosis and the pericardial effusion seen by echo are also frequently seen with cardiac amyloidosis. She additionally has tingling/numbness in hands/feet that is consistent with peripheral neuropathy.  However, PYP scan in 11/19 was negative and myeloma workup was negative.  Repeat PYP scan in 11/22 was again negative and she did not have a transthyretin gene mutation on [facility] gene testing.  Myeloma workup was again negative in 10/22.  Therefore,  cardiac amyloidosis seems unlikely and LVH may be due to HTN and aortic stenosis though delayed enhancement from MRI is more suggestive of infiltrative disease. Echo this admission shows EF 60-65%, severe LVH, normal RV, small to moderate pericardial effusion (unchanged), severe AS with mean gradient 50 mmHg (AS has been severe in past but mean gradient has progressed).  She reports dyspnea x 2 wks and has significant volume overload on exam, complicated by CKD stage IV.  I suspect that her CHF is driven by severe AS.  She diuresed well with IV Lasix and PO torsemide initially but had a creatinine bump to 3.7, intended to hold loop in the AM  yesterday but was too late. UOP stable with 1.1 L out and -550 mL.  - Can continue Jardiance.  - D/c on torsemide 40 mg daily with 40 mg PRN in the afternoon for volume overload - No ARB/ARNi or spiro with CKD 5. HTN: BP controlled on hydralazine 75 mg tid. Decreased from home 100 mg TID. 6. Conduction abnormality: Long 1st degree AVB, also runs of type 1 2nd degree AVB have been noted.   - Avoid beta blockade.  - High risk for progressive HB with TAVR, would likely end up with PPM. 7. Debility: Minimally mobile.  Has not been out of bed.  This is her baseline.  8. GOC: Pt with worsening severe AS, conduction delay, diastolic CHF, known CAD medically managed and likely progressed, worsening CKD now stage IV/V, and minimal mobility. High risk of continued volume overload and ESRD without TAVR and high risk of ESRD and post TAVR heart block needing pacemaker with TAVR. I do not think she would do well on dialysis due to limited mobility.  - palliative care on board, hospice discussed today, family and patient aware of poor prognosis, plan to go home with [facility] - recommend continue palliative care outpatient  - continue to discuss GOC  - appreciate primary and palliative teams' management  Ok to d/c home on torsemide 40 mg daily and 40 mg daily prn in the afternoon for  volume overload. Recommend close follow up with tenuous volume status and renal function. Continue GOC discussions.   Length of Stay: 10  Rocky Morel, DO  09/10/2022, 7:06 AM  Advanced Heart Failure Team Pager [phone] (M-F; 7a - 5p)  Please contact [facility] for night-coverage after hours (5p -7a ) and weekends on [email or web address]  Patient seen and examined with the above-signed Advanced Practice Provider and/or Housestaff. I personally reviewed laboratory data, imaging studies and relevant notes. I independently examined the patient and formulated the important aspects of the plan. I have edited the note to reflect any of my changes or salient points. I have personally discussed the plan with the patient and/or family.  Stable. Denies CP or SOB.   Scr improved.   General:  Elderly. No resp difficulty HEENT: normal Neck: supple. no JVD. Carotids 2+ bilat; + bruits. No lymphadenopathy or thryomegaly appreciated. Cor: PMI nondisplaced. Regular rate & rhythm. 3/6 AS Lungs: clear Abdomen: soft, nontender, nondistended. No hepatosplenomegaly. No bruits or masses. Good bowel sounds. Extremities: no cyanosis, clubbing, rash, edema Neuro: alert & orientedx3, cranial nerves grossly intact. moves all 4 extremities w/o difficulty. Affect pleasant  Scr improved. Ok for d/c today on above meds. Switch home lasix to torsemide 40 daily + extra as needed. Not candidate for TAVR.  Arvilla Meres, MD  9:14 AM

## 2022-09-10 NOTE — Plan of Care (Signed)

## 2022-09-10 NOTE — Plan of Care (Signed)
  Problem: Education: Goal: Knowledge of General Education information will improve Description: Including pain rating scale, medication(s)/side effects and non-pharmacologic comfort measures Outcome: Adequate for Discharge   Problem: Health Behavior/Discharge Planning: Goal: Ability to manage health-related needs will improve Outcome: Adequate for Discharge   Problem: Clinical Measurements: Goal: Ability to maintain clinical measurements within normal limits will improve Outcome: Adequate for Discharge Goal: Diagnostic test results will improve Outcome: Adequate for Discharge Goal: Respiratory complications will improve Outcome: Adequate for Discharge Goal: Cardiovascular complication will be avoided Outcome: Adequate for Discharge   Problem: Activity: Goal: Risk for activity intolerance will decrease Outcome: Adequate for Discharge   Problem: Nutrition: Goal: Adequate nutrition will be maintained Outcome: Adequate for Discharge   Problem: Coping: Goal: Level of anxiety will decrease Outcome: Adequate for Discharge   Problem: Elimination: Goal: Will not experience complications related to bowel motility Outcome: Adequate for Discharge Goal: Will not experience complications related to urinary retention Outcome: Adequate for Discharge   Problem: Pain Managment: Goal: General experience of comfort will improve Outcome: Adequate for Discharge   Problem: Safety: Goal: Ability to remain free from injury will improve Outcome: Adequate for Discharge   Problem: Skin Integrity: Goal: Risk for impaired skin integrity will decrease Outcome: Adequate for Discharge   Problem: Education: Discharge instructions given to pt and and husband  Meds delivered off tele and IV removed. Goal: Ability to demonstrate management of disease process will improve Outcome: Adequate for Discharge Goal: Ability to verbalize understanding of medication therapies will improve Outcome: Adequate  for Discharge Goal: Individualized Educational Video(s) Outcome: Adequate for Discharge   Problem: Activity: Goal: Capacity to carry out activities will improve Outcome: Adequate for Discharge   Problem: Cardiac: Goal: Ability to achieve and maintain adequate cardiopulmonary perfusion will improve Outcome: Adequate for Discharge

## 2022-09-10 NOTE — TOC CM/SW Note (Signed)
..  09/10/2022  Sherry [name] DOB: 1945/10/16 MRN: [id number]   RIDER WAIVER AND RELEASE OF LIABILITY  For the purposes of helping with transportation needs, [facility] partners with outside transportation providers (taxi companies, [place], Catering manager.) to give [name] patients or other approved people the choice of on-demand rides [employer]") to our buildings for non-emergency visits.  By using [employer], I, the person signing this document, on behalf of myself and/or any legal minors (in my care using the [employer]), agree:  Science writer given to me are supplied by independent, outside transportation providers who do not work for, or have any affiliation with, [name]. [facility] is not a transportation company. [facility] has no control over the quality or safety of the rides I get using [employer]. [facility] has no control over whether any outside ride will happen on time or not. [facility] gives no guarantee on the reliability, quality, safety, or availability on any rides, or that no mistakes will happen. I know and accept that traveling by vehicle (car, truck, [employer], Zenaida Niece, bus, taxi, etc.) has risks of serious injuries such as disability, being paralyzed, and death. I know and agree the risk of using [employer] is mine alone, and not [employer]. Transport Services are provided "as is" and as are available. The transportation providers are in charge for all inspections and care of the vehicles used to provide these rides. I agree not to take legal action against [facility], its agents, employees, officers, directors, representatives, insurers, attorneys, assigns, successors, subsidiaries, and affiliates at any time for any reasons related directly or indirectly to using [employer]. I also agree not to take legal action against  or its affiliates for any injury, death, or damage to property caused by or related to  using [employer]. I have read this Waiver and Release of Liability, and I understand the terms used in it and their legal meaning. This Waiver is freely and voluntarily given with the understanding that my right (or any legal minors) to legal action against  relating to [employer] is knowingly given up to use these services.   I attest that I read the Ride Waiver and Release of Liability to [name], gave [name] and [name] the opportunity to ask questions and answered the questions asked (if any). I affirm that [name] then provided consent for assistance with transportation.

## 2022-09-10 NOTE — Progress Notes (Signed)
[facility]AR arrive for pickup - daughter at bedside and notified of discharge and transport home.  Pt belonging sent home with husband.

## 2022-09-10 NOTE — Progress Notes (Signed)
Physical Therapy Treatment Patient Details Name: Sherry [name] MRN: [id number] DOB: 18-Sep-1945 Today's Date: 09/10/2022   History of Present Illness 77 yo female presents to [facility] on 7/12 as dierct admit from internal medicine center with concern for acute HF exacerbation, UTI, AKI, electrolyte imbalance. PMH includes HTN, type I AV block, Aortic Stenosis, HLD,  diastolic CHF, CKD IV, osteoporosis, seizure disorder, and history of CVA.    PT Comments  Pt expressing desire to mobilize OOB to recliner. Pt does present with improved mobility to/from EOB with less physical assist required, but pt cannot transfer OOB to recliner with total assist from PT and x3 attempts. Pt states her legs "won't work" today. Pt's husband present, states he lifts her OOB to her w/c at home. PT discussed benefits of mechanical lifts for pt and family, pt and family express understanding. VSS. PT to continue to follow.      Assistance Recommended at Discharge Frequent or constant Supervision/Assistance  If plan is discharge home, recommend the following:  Can travel by private vehicle    Two people to help with walking and/or transfers;Two people to help with bathing/dressing/bathroom      Equipment Recommendations  Other (comment) (hoyer lift)    Recommendations for Other Services       Precautions / Restrictions Precautions Precautions: Fall Precaution Comments: sacral wound (healing, mepilex on), bilat prevalon boots (not donned during session) Restrictions Weight Bearing Restrictions: No     Mobility  Bed Mobility Overal bed mobility: Needs Assistance Bed Mobility: Rolling, Sidelying to Sit, Sit to Sidelying Rolling: Supervision Sidelying to sit: Min assist, HOB elevated     Sit to sidelying: HOB elevated, Mod assist General bed mobility comments: supervision for safety, assist for completing trunk rise from EOB, and LE lift and boost up upon return to supine.    Transfers Overall  transfer level: Needs assistance Equipment used: 1 person hand held assist Transfers: Sit to/from Stand Sit to Stand: Total assist           General transfer comment: total assist for x2 semi-stand attempts for pivot into recliner, able to clear buttocks only and states her legs "won't work"    Ambulation/Gait                   Comptroller Bed    Modified Rankin (Stroke Patients Only)       Balance Overall balance assessment: Needs assistance Sitting-balance support: Bilateral upper extremity supported, Single extremity supported, Feet supported Sitting balance-Leahy Scale: Fair                                      Cognition Arousal/Alertness: Awake/alert Behavior During Therapy: WFL for tasks assessed/performed Overall Cognitive Status: Impaired/Different from baseline                               Problem Solving: Difficulty sequencing, Requires verbal cues, Requires tactile cues, Slow processing          Exercises      General Comments        Pertinent Vitals/Pain Pain Assessment Pain Assessment: Faces Faces Pain Scale: Hurts little more Pain Location: LEs Pain Descriptors / Indicators: Sore, Discomfort Pain Intervention(s): Limited activity within patient's tolerance, Monitored during  session, Repositioned    Home Living                          Prior Function            PT Goals (current goals can now be found in the care plan section) Acute Rehab PT Goals PT Goal Formulation: With patient Time For Goal Achievement: 09/16/22 Potential to Achieve Goals: Good Progress towards PT goals: Progressing toward goals    Frequency    Min 1X/week      PT Plan Current plan remains appropriate    Co-evaluation              AM-PAC PT "6 Clicks" Mobility   Outcome Measure  Help needed turning from your back to your side while in a flat bed  without using bedrails?: A Little Help needed moving from lying on your back to sitting on the side of a flat bed without using bedrails?: A Lot Help needed moving to and from a bed to a chair (including a wheelchair)?: Total Help needed standing up from a chair using your arms (e.g., wheelchair or bedside chair)?: Total Help needed to walk in hospital room?: Total Help needed climbing 3-5 steps with a railing? : Total 6 Click Score: 9    End of Session   Activity Tolerance: Patient limited by fatigue Patient left: in bed;with call bell/phone within reach;with family/visitor present Nurse Communication: Mobility status PT Visit Diagnosis: Other abnormalities of gait and mobility (R26.89);Muscle weakness (generalized) (M62.81)     Time: 8469-6295 PT Time Calculation (min) (ACUTE ONLY): 21 min  Charges:    $Therapeutic Activity: 8-22 mins PT General Charges $$ ACUTE PT VISIT: 1 Visit                     Sherry [name], PT DPT Acute Rehabilitation Services Secure Chat Preferred  Office [phone]    Sherry [name] [name] 09/10/2022, 12:49 PM

## 2022-09-10 NOTE — Discharge Summary (Signed)
Name: Sherry [name] MRN: [id number] DOB: 1945-03-20 77 y.o. PCP: Sherry Abbot, MD  Date of Admission: 08/30/2022  1:17 PM Date of Discharge: 09/10/2022 8:34 PM Attending Physician: Dr. Antony [name]  Discharge Diagnosis: Principal Problem:   Acute diastolic congestive heart failure (HCC) Active Problems:   Essential hypertension   Acute kidney injury superimposed on CKD (HCC)   Severe aortic stenosis   UTI (urinary tract infection)   Hypocalcemia    Discharge Medications: Allergies as of 09/10/2022       Reactions   Ibuprofen Other (See Comments)   Dr advised pt not to take ibuprofen   Lactose Intolerance (gi) Diarrhea        Medication List     STOP taking these medications    amLODipine 10 MG tablet Commonly known as: NORVASC   aspirin EC 81 MG tablet Replaced by: Aspirin Low Dose 81 MG chewable tablet   Calcium Carb-Cholecalciferol 500-400 MG-UNIT Tabs Commonly known as: Calcium 500 +D   cephALEXin 500 MG capsule Commonly known as: KEFLEX   denosumab 60 MG/ML Sosy injection Commonly known as: PROLIA   furosemide 20 MG tablet Commonly known as: LASIX   potassium chloride 10 MEQ tablet Commonly known as: KLOR-CON       TAKE these medications    acetaminophen 325 MG tablet Commonly known as: TYLENOL Take 2 tablets (650 mg total) by mouth every 6 (six) hours as needed for mild pain (or Fever >/= 101).   albuterol (2.5 MG/3ML) 0.083% nebulizer solution Commonly known as: PROVENTIL Take 3 mLs (2.5 mg total) by nebulization every 6 (six) hours as needed for wheezing.   Aspirin Low Dose 81 MG chewable tablet Generic drug: aspirin Chew 1 tablet (81 mg total) by mouth daily. Start taking on: September 11, 2022 Replaces: aspirin EC 81 MG tablet   calcium carbonate 1500 (600 Ca) MG Tabs tablet Commonly known as: OSCAL Take 1 tablet (1,500 mg total) by mouth 3 (three) times daily with meals.   febuxostat 40 MG tablet Commonly known as: ULORIC Take  20 mg by mouth daily.   hydrALAZINE 25 MG tablet Commonly known as: APRESOLINE Take 3 tablets (75 mg total) by mouth every 8 (eight) hours. What changed:  medication strength how much to take when to take this   Jardiance 10 MG Tabs tablet Generic drug: empagliflozin Take 1 tablet (10 mg total) by mouth daily. Start taking on: September 11, 2022 What changed:  when to take this additional instructions   lidocaine 4 % cream Commonly known as: LMX Apply topically 2 (two) times daily as needed (Pain on sacral wound area).   multivitamin Tabs tablet Take 1 tablet by mouth at bedtime.   pantoprazole 40 MG tablet Commonly known as: PROTONIX TAKE 1 TABLET BY MOUTH DAILY   PHENobarbital 64.8 MG tablet Commonly known as: LUMINAL Take 2 tablets (129.6 mg total) by mouth at bedtime.   potassium chloride 10 MEQ tablet Commonly known as: KLOR-CON M Take 1 tablet (10 mEq total) by mouth once daily.   PREPARATION H EX Apply 1 Application topically 2 (two) times daily as needed (Hemorrhoids).   rosuvastatin 40 MG tablet Commonly known as: CRESTOR Take 1 tablet (40 mg total) by mouth daily.   sevelamer carbonate 800 MG tablet Commonly known as: RENVELA Take 1 tablet (800 mg total) by mouth 3 (three) times daily with meals.   torsemide 20 MG tablet Commonly known as: DEMADEX Take 40 mg (2 tablets) daily and an extra  dose (40mg ) in the PM for swelling or shortness of breath Start taking on: September 11, 2022   Vitamin D (Ergocalciferol) 1.25 MG (50000 UNIT) Caps capsule Commonly known as: DRISDOL Take 1 capsule (50,000 Units total) by mouth every 7 (seven) days. Start taking on: September 14, 2022   zonisamide 100 MG capsule Commonly known as: ZONEGRAN TAKE 2 CAPSULES BY MOUTH AT  NIGHT               Durable Medical Equipment  (From admission, onward)           Start     Ordered   09/04/22 1533  For home use only DME Other see comment  Once       Comments: Michiel Sites Lift   Question:  Length of Need  Answer:  Lifetime   09/04/22 1533              Discharge Care Instructions  (From admission, onward)           Start     Ordered   09/10/22 0000  Discharge wound care:       Comments: Pressure ulcer: Subcutaneous fat may be visible but bone, tendon or muscle are NOT exposed. Wound care to nearly reepithelialized sacral stage 3 pressure injury: Cleanse area with house skin cleanser, pat gently dry. Cover central area only with folded piece of xeroform gauze, top with silicone foam dressing. Change every day and PRN soiling.   09/10/22 1339            Disposition and follow-up:   [name] was discharged from [facility] in Stable condition.  At the hospital follow up visit please address:  1.  Follow-up:  Heart Failure/edema  - Assess patient's volume status, follow up on how she is taking torsemide, whether she understands when she should double the dose, and how many times she has had to take torsemide 40 mg BID.   Nephrology follow-up  - Patient to have a follow-up appointment with [facility] 1-2 weeks after discharge. Discuss purpose of visit with patient, including electrolyte management and determining the need for a catheter/fistula placement if patient plans on pursuing TAVR. Follow-up creatinine function at this appointment.   Cardiology follow-up - Patient will get an echocardiogram and see Dr. Excell [name] on September 23. Discuss where patient and family are at with the decision to pursue TAVR. Depending on how she is doing, that appointment may need to happen earlier.   Electrolyte derangement  - Patient with chronically low calcium, low potassium, and elevated phosphorus. Follow up on these labs and make adjustments to medications prescribed to replete them.   Sacral wound - Ensure patient has supplies for sacral/lower back wound and review wound instructions.   Home health - Patient  with nursing and PT at home. Follow up that she is getting these services and that the hoyer lift has been delivered and installed.   Subclinical hypothyroidism - Elevated TSH 6.844 (April TSH 4.009), Free T4 0.81, likely in the setting of acute illness. Follow up TSH in outpatient visit.    2.  Labs / imaging needed at time of follow-up: RFP (Cr, Ca, K, Phosphorus), CBC (Hgb), TSH  3.  Pending labs/ test needing follow-up: none  4.  Medication Changes  STOPPED  - Amlodipine 10   - Calcium carb-cholecalciferol 500-400 mg  - cephalexin 500   - Denosumab/Prolia   - Furosemide 20   ADDED  - Torsemide 40  mg daily (take 40 mg BID PRN) - Calcium carbonate 1250 mg  - Multivitamin    - Lidocaine 4% cream  - Preparation H   - Vitamin D 1.25 mg weekly    MODIFIED  - Hydralazine 100 to 75 TID   Follow-up Appointments:  Follow-up Information     [facility] Follow up.   Specialty: Home Health Services Why: Home Health RN and Physical Therapy-agency will call to arrange appointment Contact information: [address] [place] [address] [phone]         Morene Crocker, MD. Go on 09/17/2022.   Specialty: Internal Medicine Contact information: [address] [place] [address] [phone]         [place], [place]. Schedule an appointment as soon as possible for a visit in 2 week(s).   Contact information: [address] [place] [address] [phone]         Tonny Bollman, MD .   Specialty: Cardiology Contact information: [address] [place] [address] [phone]                 Hospital Course by problem list: Patient Summary:  TANESHIA LORENCE is a 77 y.o. with a pertinent PMH of HFpEF (60-65% EF), severe AS, mild MR, and CKD 4 who presented for volume overload as a direct admission from [facility] on 7/12 for acute heart failure, AKI on CKD, UTI, and electrolyte disturbances  including hypocalcemia.   Acute on Chronic Diastolic Heart Failure Severe AS, non rheumatic  Severe LVH, ?Cardiac amyloid History of 2:1 AV block  Repeat TTE this admission with LVEF of 60-65% with normal LV function, similar to 10/2021 echo. Aortic valve area 0.62, mean gradient of 50 up 29.8 from September 2023 echo. History of 2:1 AV block. Etiology of acute HF likely due to inadequate oral diuretic dose, progressive CKD and worsening aortic stenosis.   Patient's home dose is oral lasix 20 mg TID. Patient's home dose was increased to lasix 60 BID 7/09-7/12 with no improvement in diffuse swelling. Patient admitted with 2+ pitting edema on upper and lower extremities and JVP up to the mandible.  Patient follows with cardiology outpatient (Dr. Excell [name]). Consulted heart failure team 7/15, started following patient 7/16. Patient initially started on IV lasix 80 mg BID 7/12-7/14, 7/15 increased to IV lasix 80 TID. Started on IV lasix drip 12 mg/hr 7/16-7/20 with metolazone doses given 7/17, 7/18. Restarted Jardiance 7/19. Switch to PO torsemide 60 7/21-7/22. Decided on new home dose PO torsemide 40 daily, with and additional torsemide 40 PRN for worsening edema, increased work of breathing, and worsening orthopnea. Patient's weight on admission 83 kg, 77.6 kg on discharge, net weight -5.4 kg.   Home medications held and restarted slowly. Jardiance was restarted 7/19. Hydralazine 100 TID was decreased to hydralazine 75 TID. Amlodipine was held indefinitely. Dr. Shirlee Latch with cardiology counseled patient and husband on risks associated with TAVR including renal failure and dialysis, heart block and pacemaker, and possible inability to tolerate these procedures. Palliative also engaged in conversations with family to discuss prognosis and GOC. Patient and family have not made final decision on TAVR or hospice care. Have decided to try going home with [facility] for a month and then revisiting idea of hospice.     AKI on  CKD IV Current presentation concerning for progression of CKD vs congestion in setting of acute HF vs AKI in setting of intravascular depletion 2/2 anasarca  vs UTI (see below). AKI could also be due to acute heart failure or chronic high doses of diuretics. Patient has seen Dr. Signe Colt at [facility] (10/11/21) and has not followed up regularly.   Baseline creatinine around 2.5+. Patient with CKD stage IV, Cr of 3.19 with dysuria and UA positive for leukocytes and cloudy urine at 7/09 clinic visit. Prescribed cephalexin 500 mg BID starting on 7/09 but may not have taken all of her medications regularly on day of admission. Cr on day of admission of 3.34. Repeat UA showing moderate leukocytes, proteinuria, no bacteria, and clear urine. Patient denied pain with urination.   No improvement in Cr or GFR on BID 80 mg lasix. Cr and GFR improved on IV lasix. When patient was switched to PO torsemide 60 Cr worsened slightly initially, then improved to Cr 3.44 and GFR 12 on day of discharge. Patient will follow up with [facility] 1-2 weeks after discharge to monitor CKD and help determine next steps for dialysis if TAVR is pursued.    Hypocalcemia Secondary Hyperparathyroidism  Mild vitamin D deficiency Patient presented to the [facility] on 7/9 with Ca of 5.8. Repeat Ca 7/12 of 6.1/corrected calcium 7.4 with worsening anasarca and SOB with activity. Corrected calcium 7/13 of 8.0 following administration of 2g IV Ca and 1000 mg PO Ca on 7/12. Patient denied confusion or numbness throughout admission, but did endorse muscle spasms on day of discharge. She stated her right upper extremity "tremor" is intermittent.    Per chart review, patient was not taking Calcium Carb-Cholecalciferol 500-400 mg BID regularly. She was prescribed calcium carbonate 2,500 mg BID at 7/09 clinic visit but it does not appear this was dispensed. She was getting denosumab injections for  osteoporosis every 6 months, but she may not have been getting them since October 2023. Denosumab has a known side effect of reduced calcium especially in patients with known kidney disease. Patient with 5-pack smoking history, CXR from 2021 for comparison, no recent CT scan of lungs.    PTH 185 and persistently low Ca in line with secondary hyperparathyroidism associated with her CKD. Patient remained asymptomatic without major changes on telemetry. Received calcium carbonate TID and Vitamin D 1000 international units daily until 7/18 with minimal improvement to her calcium levels.  Patient started on a calcium tablet 1250 TID, changed Vitamin D to every other day x2 doses then weekly, and completed some rounds of IV calcium 1-2g. Corrected calcium on day of discharge 7.9 mg/dL.   Acute on chronic normocytic anemia Hgb on day of admission 7.8. One year ago Hgb 11.4. Chronic anemia likely in setting of chronic disease/stage IV CKD. Patient has history of hemorrhoids and experiences intermittent straining. No overt signs of bleeding overnight. Last colonoscopy in 2008. Given history of severe AS, question possible AVMs. Hgb. 7.3-7.7, Hgb 7.3 on day of discharge.    Pressure wounds Pictures in media tab. Wound care consulted, made recommendations (refer to note). Patient's pain did not improve too much with lidocaine cream. Ordered bariatric bed to help offload the pressure. Some improvement in lower back pain and discomfort with lidocaine 4% cream PRN and muscle rub CREA topical PRN for muscle pain. Patient discharged with supplies and instructions from wound care to continue the healing process at home.    Nonbleeding external hemorrhoids with mucosal prolapse Constipation Monitored for signs of bleeding and discomfort, no associated pain or acute bleeds throughout hospitalization. Received a one-time dose of sorbitol on  7/14. Scheduled MiraLAX daily and senna S2 tabs twice daily.    UTI,  resolved Diagnosed outpatient with improvement after starting Keflex therapy on 7/9. Patient completed 5 days of UTI therapy with Cephalexin 500 mg BID on 7/14. Patient denied dysuria on day of discharge. Did use foley catheter for most of the admission for strict input/output, and was able to void successfully with external catheter. Does report urinary incontinence and family was provided with information to purchase external catheter device for home.    Subclinical hypothyroidism Elevated TSH 6.844 (April TSH 4.009), Free T4 0.81, likely in the setting of acute illness. Recommend follow-up in the outpatient setting.    Chronic health conditions Seizure disorder- continue phenobarbital 129.6 mg, zonisamide/zonegran 200 mg daily; normal phenobarbital levels Chronic pain/osteoarthritis- Tylenol 650 q6h PRN  Asthma- albuterol PRN  CVA- aspirin 81 daily  GERD pantoprazole 40 mg CAD- rosuvastatin 40 mg Osteoporosis- denosumab injection every 6 months DISCONTINUE, patient intolerance   Discharge Subjective: No overnight events, husband at bedside. Patient states she is feeling better this morning, denies CP, SOB, N/V, dysuria, or constipation. Has been trying to sit up in bed and work with PT. No complaints today.   Discharge Exam:   Blood pressure (!) 128/58, pulse 88, temperature 97.6 F (36.4 C), temperature source Oral, resp. rate 17, height 5\' 1"  (1.549 m), weight 77.6 kg, SpO2 100%.  Constitutional: Resting in bed in no acute distress, conversing appropriately.  HENT: Normocephalic atraumatic, mucous membranes moist Cardiovascular: Regular rate and rhythm, no m/r/g, b/l LE edema improved, no edema b/l upper extremities.  Pulmonary/Chest: Normal work of breathing on room air, lungs clear to auscultation bilaterally. Diffuse wheezing throughout posterior lobes.   Abdominal: Soft, non-tender, non-distended. Positive bowel sounds. Neurological: Alert and oriented.  Skin: Warm and  dry. Psych: Normal mood and affect  Pertinent Labs, Studies, and Procedures:     Latest Ref Rng & Units 09/10/2022    2:17 AM 09/09/2022    1:12 AM 09/08/2022   12:56 AM  CBC  WBC 4.0 - 10.5 K/uL 2.0  3.4  2.7   Hemoglobin 12.0 - 15.0 g/dL 7.3  7.6  7.5   Hematocrit 36.0 - 46.0 % 23.5  24.8  24.4   Platelets 150 - 400 K/uL 260  283  276        Latest Ref Rng & Units 09/10/2022    2:17 AM 09/09/2022    1:12 AM 09/08/2022   12:56 AM  CMP  Glucose 70 - 99 mg/dL 95  130  91   BUN 8 - 23 mg/dL 865  784  94   Creatinine 0.44 - 1.00 mg/dL 6.96  2.95  2.84   Sodium 135 - 145 mmol/L 136  133  134   Potassium 3.5 - 5.1 mmol/L 3.4  3.8  4.0   Chloride 98 - 111 mmol/L 97  96  99   CO2 22 - 32 mmol/L 22  24  24    Calcium 8.9 - 10.3 mg/dL 6.3  6.3  6.4     ECHOCARDIOGRAM COMPLETE  Result Date: 08/31/2022    ECHOCARDIOGRAM REPORT   Patient Name:   AKYLA VAVREK Date of Exam: 08/31/2022 Medical Rec #:  [id number]           Height:       61.0 in Accession #:    [id number]          Weight:       186.3 lb Date of Birth:  [birth year]           BSA:          1.833 m Patient Age:    77 years            BP:           155/100 mmHg Patient Gender: F                   HR:           90 bpm. Exam Location:  Inpatient Procedure: 2D Echo, Cardiac Doppler and Color Doppler Indications:    Congestive Heart Failure  History:        Patient has prior history of Echocardiogram examinations, most                 recent 11/02/2021. CAD, TIA, Aortic Valve Disease,                 Signs/Symptoms:Hypotension; Risk Factors:Hypertension,                 Dyslipidemia and Former Smoker.  Sonographer:    Raeford Razor Referring Phys: 18 EMILY B MULLEN  Sonographer Comments: Image acquisition challenging due to patient body habitus. IMPRESSIONS  1. Left ventricular ejection fraction, by estimation, is 60 to 65%. The left ventricle has normal function. The left ventricle has no regional wall motion abnormalities. There is severe  concentric left ventricular hypertrophy. Indeterminate diastolic filling due to E-A fusion.  2. Right ventricular systolic function is normal. The right ventricular size is normal. Tricuspid regurgitation signal is inadequate for assessing PA pressure.  3. Left atrial size was moderately dilated.  4. A small to moderate pericardial effusion is present. The pericardial effusion is circumferential. Large pleural effusion in the left lateral region.  5. The mitral valve is abnormal. Trivial mitral valve regurgitation. No evidence of mitral stenosis. Severe mitral annular calcification.  6. The aortic valve is tricuspid. There is severe calcifcation of the aortic valve. There is severe thickening of the aortic valve. Aortic valve regurgitation is mild. Severe aortic valve stenosis. Aortic valve area, by VTI measures 0.62 cm. Aortic valve mean gradient measures 50.0 mmHg. Aortic valve Vmax measures 4.56 m/s.  7. The inferior vena cava is dilated in size with >50% respiratory variability, suggesting right atrial pressure of 8 mmHg. Comparison(s): Changes from prior study are noted. Severe AS now. Speckled appearance of myocardium and severe AS suggestive of Amyloid but prior PYP scans are negative. FINDINGS  Left Ventricle: Left ventricular ejection fraction, by estimation, is 60 to 65%. The left ventricle has normal function. The left ventricle has no regional wall motion abnormalities. The left ventricular internal cavity size was normal in size. There is  severe concentric left ventricular hypertrophy. Indeterminate diastolic filling due to E-A fusion. Right Ventricle: The right ventricular size is normal. No increase in right ventricular wall thickness. Right ventricular systolic function is normal. Tricuspid regurgitation signal is inadequate for assessing PA pressure. Left Atrium: Left atrial size was moderately dilated. Right Atrium: Right atrial size was normal in size. Pericardium: A small pericardial effusion  is present. The pericardial effusion is circumferential. Mitral Valve: The mitral valve is abnormal. Severe mitral annular calcification. Trivial mitral valve regurgitation. No evidence of mitral valve stenosis. Tricuspid Valve: The tricuspid valve is normal in structure. Tricuspid valve regurgitation is trivial. No evidence of tricuspid stenosis. Aortic Valve: The aortic valve is tricuspid. There is severe calcifcation of the aortic valve. There is severe thickening  of the aortic valve. Aortic valve regurgitation is mild. Severe aortic stenosis is present. Aortic valve mean gradient measures 50.0  mmHg. Aortic valve peak gradient measures 83.2 mmHg. Aortic valve area, by VTI measures 0.62 cm. Pulmonic Valve: The pulmonic valve was normal in structure. Pulmonic valve regurgitation is trivial. No evidence of pulmonic stenosis. Aorta: The aortic root and ascending aorta are structurally normal, with no evidence of dilitation. Venous: The inferior vena cava is dilated in size with greater than 50% respiratory variability, suggesting right atrial pressure of 8 mmHg. IAS/Shunts: No atrial level shunt detected by color flow Doppler. Additional Comments: There is a large pleural effusion in the left lateral region.  LEFT VENTRICLE PLAX 2D LVIDd:         3.90 cm   Diastology LVIDs:         2.70 cm   LV e' lateral:   9.68 cm/s LV PW:         1.80 cm   LV E/e' lateral: 21.9 LV IVS:        1.80 cm LVOT diam:     2.00 cm LV SV:         63 LV SV Index:   34 LVOT Area:     3.14 cm  RIGHT VENTRICLE          IVC RV Basal diam:  3.10 cm  IVC diam: 2.40 cm TAPSE (M-mode): 2.2 cm LEFT ATRIUM            Index        RIGHT ATRIUM           Index LA diam:      4.60 cm  2.51 cm/m   RA Area:     16.10 cm LA Vol (A2C): 143.0 ml 78.03 ml/m  RA Volume:   43.40 ml  23.68 ml/m LA Vol (A4C): 71.8 ml  39.18 ml/m  AORTIC VALVE AV Area (Vmax):    0.73 cm AV Area (Vmean):   0.68 cm AV Area (VTI):     0.62 cm AV Vmax:           456.00 cm/s  AV Vmean:          344.000 cm/s AV VTI:            1.020 m AV Peak Grad:      83.2 mmHg AV Mean Grad:      50.0 mmHg LVOT Vmax:         106.00 cm/s LVOT Vmean:        74.600 cm/s LVOT VTI:          0.201 m LVOT/AV VTI ratio: 0.20  AORTA Ao Root diam: 2.40 cm Ao Asc diam:  3.30 cm MITRAL VALVE MV Area (PHT): 5.16 cm     SHUNTS MV Decel Time: 147 msec     Systemic VTI:  0.20 m MV E velocity: 212.00 cm/s  Systemic Diam: 2.00 cm Vishnu Priya Mallipeddi Electronically signed by Winfield Rast Mallipeddi Signature Date/Time: 08/31/2022/4:02:55 PM    Final    US RENAL  Result Date: 08/30/2022 CLINICAL DATA:  AKI EXAM: RENAL / URINARY TRACT ULTRASOUND COMPLETE COMPARISON:  Renal ultrasound 10/18/2021 FINDINGS: Right Kidney: Renal measurements: 10.2 x 4.2 x 3.9 cm = volume: 87 mL. Increased parenchymal echogenicity. No hydronephrosis. 2.8 cm renal cyst does not require follow-up. Left Kidney: Not visualized due to bowel gas. Bladder: Appears normal for degree of bladder distention. Other: Bilateral pleural effusions. IMPRESSION: 1. Increased parenchymal echogenicity  in the right kidney compatible with medical renal disease. The left kidney was not visualized due to bowel gas. 2. Small bilateral pleural able effusions. Electronically Signed   By: Minerva Fester M.D.   On: 08/30/2022 23:54   Portable chest 1 View  Result Date: 08/30/2022 CLINICAL DATA:  CHF EXAM: PORTABLE CHEST 1 VIEW COMPARISON:  Previous studies including the CT done on 2021-02-03 FINDINGS: Transverse diameter of heart is increased. Central pulmonary vessels are prominent. There are no signs of alveolar pulmonary edema. Small bilateral pleural effusions are seen. Dense calcification is seen in mitral annulus. IMPRESSION: Cardiomegaly. Central pulmonary vessels are prominent suggesting CHF. Small bilateral pleural effusions. Electronically Signed   By: Ernie Avena M.D.   On: 08/30/2022 18:35     Discharge Instructions: Discharge  Instructions     (HEART FAILURE PATIENTS) Call MD:  Anytime you have any of the following symptoms: 1) 3 pound weight gain in 24 hours or 5 pounds in 1 week 2) shortness of breath, with or without a dry hacking cough 3) swelling in the hands, feet or stomach 4) if you have to sleep on extra pillows at night in order to breathe.   Complete by: As directed    Call MD for:  difficulty breathing, headache or visual disturbances   Complete by: As directed    Call MD for:  extreme fatigue   Complete by: As directed    Call MD for:  hives   Complete by: As directed    Call MD for:  persistant dizziness or light-headedness   Complete by: As directed    Call MD for:  persistant nausea and vomiting   Complete by: As directed    Call MD for:  redness, tenderness, or signs of infection (pain, swelling, redness, odor or green/yellow discharge around incision site)   Complete by: As directed    Call MD for:  severe uncontrolled pain   Complete by: As directed    Call MD for:  temperature >100.4   Complete by: As directed    Discharge instructions   Complete by: As directed    PATIENT INSTRUCTIONS:  - You were seen for volume overload and found to have a heart failure exacerbation that was probably caused by your severe aortic stenosis (the big artery that carries blood to the rest of the body is constricted).   - You were treated by the heart failure team, the internal medicine team, and palliative care team. Your heart failure team has made recommendations for your diuretic, or medication that helps you keep you from swelling.  - You will now take a medication called Torsemide 40 mg (2 tablets) in the morning every day. If you start feeling short of breath, you feel pressure in your chest when you try to lay down, or you start to see more swelling on your arms, hands, legs, or feet, PLEASE take a SECOND Torsemide 40 mg (2 tablets) in the afternoon. If your swelling and breathing improves, you can go  back to taking ONE Torsemide 40 mg tablet in the morning every day.   - You have a hospital follow-up appointment with Dr. Morene Crocker at the [facility] on Tuesday July 30 at 3:15 PM. We look forward to seeing you at your appointment!  - You have a follow-up appointment with your cardiologist, Dr. Excell [name], and scheduled echocardiogram (ultrasound of your heart, tells you how well it is working) on Monday September 23 at 1:45 PM.  You can discuss your decision to do a TAVR, or Transcatheter aortic valve implantation, at that time.   - We had to add some medications while you were here to help with your electrolytes given your chronic kidney disease. Those have been given to you- please continue to take them. You will need to make an appointment with nephrology at [facility] in the next 1-2 weeks. Their phone number is: [phone]. Their address is [address]. [place], [place] [address]. Their hours are 8 AM to 5 PM. Please call today to make that appointment.   - A wound care nurse provided some supplies for your sacral/lower back wound. Her instructions are:  - Cleanse area with house skin cleanser, pat gently dry.  - Cover central area only with folded piece of xeroform gauze, top with silicone foam dressing.  - Change every day and as needed if the dressing gets dirty or wet.  - Desitin paste can help promote drying and healing.   - It is cheaper for you to buy BOOST nutritional drinks on your own than through insurance. These are the lactose-free, please take 2 times daily between meals.  - You will be getting home health. Your social worker has confirmed this. Please let us know at your follow-up appointment if there are any problems, or if you have not yet received a hoyer lift.   - Please call our clinic during office hours (8:00 AM - 5 PM) at [phone] if you have any questions regarding your care. Please call 911 if you have a medical  emergency, including sudden shortness of breath, chest pain, confusion, you can't urinate, or fever/chills with worsening back pain.   It was a pleasure serving you during your stay with [place]! - Dr. Justin Mend and team   Discharge wound care:   Complete by: As directed    Pressure ulcer: Subcutaneous fat may be visible but bone, tendon or muscle are NOT exposed. Wound care to nearly reepithelialized sacral stage 3 pressure injury: Cleanse area with house skin cleanser, pat gently dry. Cover central area only with folded piece of xeroform gauze, top with silicone foam dressing. Change every day and PRN soiling.   Increase activity slowly   Complete by: As directed       Signed: Theodore Virgin Colbert Coyer, MD [facility] - PGY1 Pager: [phone] 09/10/2022, 8:34 PM    Please contact the on call pager after 5 pm and on weekends at [phone].

## 2022-09-10 NOTE — Progress Notes (Signed)
Occupational Therapy Treatment Patient Details Name: Sherry [name] MRN: [id number] DOB: 07-06-1945 Today's Date: 09/10/2022   History of present illness 77 yo female presents to [facility] on 7/12 as dierct admit from internal medicine center with concern for acute HF exacerbation, UTI, AKI, electrolyte imbalance. PMH includes HTN, type I AV block, Aortic Stenosis, HLD,  diastolic CHF, CKD IV, osteoporosis, seizure disorder, and history of CVA.   OT comments  Patient progressing and showed improved EOB sitting balance, allowing pt  to participate in EOB grooming activities with  setup and supervision only, compared to previous session when pt required external assist to sit due to posterior bias. Pt was able to clear her hips from EOB with Max As of 2 people pulling up on back handles of recliner, and may be able to work well with a Corene Cornea. Patient remains limited by profound generalized weakness esp to LLE with knee buckle and knee/ankle/foot turned in with pt reporting pain when gently training foot to neutral in Prevalon boot.  Further limitations include bilateral knee and IT band pain, and decreased activity tolerance along with deficits noted below. Pt continues to demonstrate good rehab potential and would benefit from continued skilled OT to increase safety and independence with ADLs and functional transfers to allow pt to return home safely and reduce caregiver burden and fall risk.    Recommendations for follow up therapy are one component of a multi-disciplinary discharge planning process, led by the attending physician.  Recommendations may be updated based on patient status, additional functional criteria and insurance authorization.    Assistance Recommended at Discharge    Patient can return home with the following  Help with stairs or ramp for entrance;Assist for transportation;Assistance with cooking/housework;A lot of help with bathing/dressing/bathroom;Two people to help with  walking and/or transfers   Equipment Recommendations   Michiel Sites lift)    Recommendations for Other Services      Precautions / Restrictions Precautions Precautions: Fall Precaution Comments: sacral wound (healing, mepilex on), bilat prevalon boots (not donned during session) Restrictions Weight Bearing Restrictions: No       Mobility Bed Mobility Overal bed mobility: Needs Assistance Bed Mobility: Rolling, Sidelying to Sit, Sit to Sidelying Rolling: Min guard Sidelying to sit: Mod assist     Sit to sidelying: HOB elevated, Mod assist General bed mobility comments: supervision for safety, assist for completing trunk rise from EOB, and LE lift and boost up upon return to supine.    Transfers                         Balance Overall balance assessment: Needs assistance Sitting-balance support: Single extremity supported, Feet supported Sitting balance-Leahy Scale: Fair     Standing balance support: Bilateral upper extremity supported Standing balance-Leahy Scale: Zero Standing balance comment: LT knee buckles. Pt's LT ankle turned inward. Pt reports this is new. Pt reported pain when Prevalon boot applied on LT foot to train foot and ankle to more neutral position but once in boot for a minute, pt reported it was tolerable.                           ADL either performed or assessed with clinical judgement   ADL Overall ADL's : Needs assistance/impaired     Grooming: Set up;Supervision/safety;Wash/dry face;Oral care;Sitting Grooming Details (indicate cue type and reason): At EOB without need of external balance assistance. Upper Body Bathing: Min guard;Sitting  Toilet Transfer: Maximal assistance;+2 for physical assistance Toilet Transfer Details (indicate cue type and reason): Worked on [employer], beginning with lateral scooting along EOB. Pt required LT knee blocked and Max As of 1.  Pt asked to try a stand.  Attempted  bear hug stand technique and pt able to clear hips with Max As, but required lower back to EOB quickly due to LT LE instability. Recliner placed anterior to pt, using back handles for pt to pull and pt's spouse placed recliner close to partially block knees. Pt stood from EOB x 3 reps pullling up on recliner with Max As of 2 people. During last stand, pt able to hold ~20 seconds before spopuse became fatigued and pt lowered back to EOB.  Pt educated on [place] and that OT would write note to bring next time. Toileting- Clothing Manipulation and Hygiene: Total assistance;Bed level              Extremity/Trunk Assessment Upper Extremity Assessment Upper Extremity Assessment: Generalized weakness   Lower Extremity Assessment Lower Extremity Assessment: LLE deficits/detail LLE Deficits / Details: Buckles, turned in at knee and ankle.   Cervical / Trunk Assessment Cervical / Trunk Assessment: Kyphotic    Vision Ability to See in Adequate Light: 0 Adequate Patient Visual Report: No change from baseline     Perception     Praxis      Cognition Arousal/Alertness: Awake/alert Behavior During Therapy: WFL for tasks assessed/performed Overall Cognitive Status: Impaired/Different from baseline Area of Impairment: Memory                     Memory: Decreased short-term memory         General Comments: Very pleasant and motivated.        Exercises      Shoulder Instructions       General Comments      Pertinent Vitals/ Pain       Pain Assessment Pain Assessment: 0-10 Pain Score: 10-Worst pain ever Faces Pain Scale: Hurts little more Pain Location: Along Bilateral IT bands and at kness. Pain Descriptors / Indicators: Sore, Stabbing Pain Intervention(s): Limited activity within patient's tolerance, Monitored during session, Repositioned, Other (comment) (Muscle cream in room and applied to each IT band and knee with light massage to IT bands)  Home Living                                           Prior Functioning/Environment              Frequency  Min 1X/week        Progress Toward Goals  OT Goals(current goals can now be found in the care plan section)  Progress towards OT goals: Progressing toward goals  Acute Rehab OT Goals Patient Stated Goal: Be able to stand up OT Goal Formulation: With patient/family Time For Goal Achievement: 09/17/22 Potential to Achieve Goals: Fair (Sendentary lifestyle)  Plan Discharge plan remains appropriate    Co-evaluation                 AM-PAC OT "6 Clicks" Daily Activity     Outcome Measure   Help from another person eating meals?: None Help from another person taking care of personal grooming?: A Little Help from another person toileting, which includes using toliet, bedpan, or urinal?: A Lot Help from another person bathing (including  washing, rinsing, drying)?: A Lot Help from another person to put on and taking off regular upper body clothing?: A Little Help from another person to put on and taking off regular lower body clothing?: A Lot 6 Click Score: 16    End of Session Equipment Utilized During Treatment: Gait belt  OT Visit Diagnosis: Muscle weakness (generalized) (M62.81)   Activity Tolerance Patient tolerated treatment well   Patient Left in bed;with call bell/phone within reach;with family/visitor present (No bed alarm on bed)   Nurse Communication          Time: 6387-5643 OT Time Calculation (min): 29 min  Charges: OT General Charges $OT Visit: 1 Visit OT Treatments $Self Care/Home Management : 8-22 mins $Therapeutic Activity: 8-22 mins  Victorino Dike, OT Acute Rehab Services Office: [phone] 09/10/2022   Theodoro Clock 09/10/2022, 1:45 PM

## 2022-09-10 NOTE — Plan of Care (Signed)
  Problem: Education: Goal: Knowledge of General Education information will improve Description Including pain rating scale, medication(s)/side effects and non-pharmacologic comfort measures Outcome: Progressing   Problem: Health Behavior/Discharge Planning: Goal: Ability to manage health-related needs will improve Outcome: Progressing   Problem: Clinical Measurements: Goal: Ability to maintain clinical measurements within normal limits will improve Outcome: Progressing Goal: Diagnostic test results will improve Outcome: Progressing Goal: Respiratory complications will improve Outcome: Progressing Goal: Cardiovascular complication will be avoided Outcome: Progressing   Problem: Activity: Goal: Risk for activity intolerance will decrease Outcome: Progressing   Problem: Nutrition: Goal: Adequate nutrition will be maintained Outcome: Progressing   Problem: Coping: Goal: Level of anxiety will decrease Outcome: Progressing   Problem: Elimination: Goal: Will not experience complications related to bowel motility Outcome: Progressing Goal: Will not experience complications related to urinary retention Outcome: Progressing   Problem: Pain Managment: Goal: General experience of comfort will improve Outcome: Progressing   Problem: Safety: Goal: Ability to remain free from injury will improve Outcome: Progressing   Problem: Skin Integrity: Goal: Risk for impaired skin integrity will decrease Outcome: Progressing   Problem: Education: Goal: Ability to demonstrate management of disease process will improve Outcome: Progressing Goal: Ability to verbalize understanding of medication therapies will improve Outcome: Progressing Goal: Individualized Educational Video(s) Outcome: Progressing   Problem: Activity: Goal: Capacity to carry out activities will improve Outcome: Progressing   Problem: Cardiac: Goal: Ability to achieve and maintain adequate cardiopulmonary  perfusion will improve Outcome: Progressing

## 2022-09-11 ENCOUNTER — Telehealth: Payer: Self-pay

## 2022-09-11 ENCOUNTER — Encounter: Payer: Self-pay | Admitting: Student

## 2022-09-11 NOTE — Transitions of Care (Post Inpatient/ED Visit) (Signed)
09/11/2022  Name: Sherry [name] MRN: [id number] DOB: 25-May-1945  Today's TOC FU Call Status: Today's TOC FU Call Status:: Successful TOC FU Call Competed TOC FU Call Complete Date: 09/11/22  Transition Care Management Follow-up Telephone Call Date of Discharge: 09/10/22 Discharge Facility: [facility]) Type of Discharge: Inpatient Admission Primary Inpatient Discharge Diagnosis:: Acute Diastolic Congestive Heart Failure How have you been since you were released from the hospital?: Better Any questions or concerns?: No  Items Reviewed: Did you receive and understand the discharge instructions provided?: Yes Medications obtained,verified, and reconciled?: Yes (Medications Reviewed) Any new allergies since your discharge?: No Dietary orders reviewed?: No Do you have support at home?: Yes People in Home: spouse, child(ren), adult Name of Support/Comfort Primary Source: Sherry [name]  Medications Reviewed Today: Medications Reviewed Today     Reviewed by Sherry Gross, RN (Case Manager) on 09/11/22 at 1340  Med List Status: <None>   Medication Order Taking? Sig Documenting Provider Last Dose Status Informant  acetaminophen (TYLENOL) 325 MG tablet [id number] Yes Take 2 tablets (650 mg total) by mouth every 6 (six) hours as needed for mild pain (or Fever >/= 101). Sherry Crocker, MD Taking Active   albuterol (PROVENTIL) (2.5 MG/3ML) 0.083% nebulizer solution [id number] Yes Take 3 mLs (2.5 mg total) by nebulization every 6 (six) hours as needed for wheezing. Sherry Crocker, MD Taking Active   aspirin 81 MG chewable tablet [id number] Yes Chew 1 tablet (81 mg total) by mouth daily. Sherry Crocker, MD Taking Active   calcium carbonate (OSCAL) 1500 (600 Ca) MG TABS tablet [id number] Yes Take 1 tablet (1,500 mg total) by mouth 3 (three) times daily with meals. Sherry Crocker, MD Taking Active   empagliflozin (JARDIANCE) 10 MG TABS tablet [id number] Yes Take 1  tablet (10 mg total) by mouth daily. Sherry Crocker, MD Taking Active   febuxostat (ULORIC) 40 MG tablet [id number] Yes Take 20 mg by mouth daily. [provider] Taking Active Child, Pharmacy Records  hydrALAZINE (APRESOLINE) 25 MG tablet [id number] Yes Take 3 tablets (75 mg total) by mouth every 8 (eight) hours. Sherry Crocker, MD Taking Active   Hydrocortisone (PREPARATION H EX) [id number]  Apply 1 Application topically 2 (two) times daily as needed (Hemorrhoids). [provider]  Active Child, Pharmacy Records  lidocaine (LMX) 4 % cream [id number] Yes Apply topically 2 (two) times daily as needed (Pain on sacral wound area). Sherry Crocker, MD Taking Active   multivitamin (RENA-VIT) TABS tablet [id number] Yes Take 1 tablet by mouth at bedtime. Sherry Crocker, MD Taking Active   pantoprazole (PROTONIX) 40 MG tablet [id number] Yes TAKE 1 TABLET BY MOUTH DAILY Sherry Abbot, MD Taking Active Child, Pharmacy Records  PHENobarbital (LUMINAL) 64.8 MG tablet [id number] Yes Take 2 tablets (129.6 mg total) by mouth at bedtime. Sherry Clines, MD Taking Active Child, Pharmacy Records  potassium chloride [place] Dura M) 10 MEQ tablet [id number] Yes Take 1 tablet (10 mEq total) by mouth once daily. Sherry Crocker, MD Taking Active   rosuvastatin (CRESTOR) 40 MG tablet [id number] Yes Take 1 tablet (40 mg total) by mouth daily. Sherry [name] Taking Active Child, Pharmacy Records  sevelamer carbonate (RENVELA) 800 MG tablet [id number] Yes Take 1 tablet (800 mg total) by mouth 3 (three) times daily with meals. Sherry Crocker, MD Taking Active   torsemide (DEMADEX) 20 MG tablet [id number] Yes Take 40 mg (2 tablets) daily and an extra dose (40mg ) in the PM for swelling or shortness of breath Sherry Crocker, MD Taking Active  Vitamin D, Ergocalciferol, (DRISDOL) 1.25 MG (50000 UNIT) CAPS capsule [id number] Yes Take 1 capsule (50,000 Units total) by  mouth every 7 (seven) days. Sherry Crocker, MD Taking Active   zonisamide [facility]) 100 MG capsule [id number]  TAKE 2 CAPSULES BY MOUTH AT  Sherry Nettle, MD  Active Child, Pharmacy Records            Home Care and Equipment/Supplies: Were Home Health Services Ordered?: Yes Name of Home Health Agency:: [facility] Has Agency set up a time to come to your home?: Yes First Home Health Visit Date: 09/12/22 Any new equipment or medical supplies ordered?: No  Functional Questionnaire: Do you need assistance with bathing/showering or dressing?: Yes Do you need assistance with meal preparation?: Yes Do you need assistance with eating?: No Do you have difficulty maintaining continence: No Do you need assistance with getting out of bed/getting out of a chair/moving?: Yes Do you have difficulty managing or taking your medications?: No  Follow up appointments reviewed: PCP Follow-up appointment confirmed?: Yes Date of PCP follow-up appointment?: 09/17/22 Follow-up Provider: Dr. Theophilus [name] Specialist [facility] Follow-up appointment confirmed?: Yes Date of Specialist follow-up appointment?: 09/20/22 Follow-Up Specialty Provider:: [facility] (to see NP and PA) Do you need transportation to your follow-up appointment?: No (uses [facility])  SDOH Interventions Today    Flowsheet Row Most Recent Value  SDOH Interventions   Food Insecurity Interventions Intervention Not Indicated  Transportation Interventions [facility] Psychologist, forensic [employer])      Sherry [name] Care Management Coordinator [facility] Phone: [phone]/Fax: [phone]

## 2022-09-11 NOTE — Progress Notes (Signed)
Internal Medicine Clinic Attending  I was physically present during the key portions of the resident provided service and participated in the medical decision making of patient's management care. I reviewed pertinent patient test results.  The assessment, diagnosis, and plan were formulated together and I agree with the documentation in the resident's note.  Gust Rung, DO

## 2022-09-12 ENCOUNTER — Other Ambulatory Visit: Payer: Self-pay | Admitting: Internal Medicine

## 2022-09-12 DIAGNOSIS — N2581 Secondary hyperparathyroidism of renal origin: Secondary | ICD-10-CM | POA: Diagnosis not present

## 2022-09-12 DIAGNOSIS — I130 Hypertensive heart and chronic kidney disease with heart failure and stage 1 through stage 4 chronic kidney disease, or unspecified chronic kidney disease: Secondary | ICD-10-CM | POA: Diagnosis not present

## 2022-09-12 DIAGNOSIS — I051 Rheumatic mitral insufficiency: Secondary | ICD-10-CM | POA: Diagnosis not present

## 2022-09-12 DIAGNOSIS — K644 Residual hemorrhoidal skin tags: Secondary | ICD-10-CM | POA: Diagnosis not present

## 2022-09-12 DIAGNOSIS — K219 Gastro-esophageal reflux disease without esophagitis: Secondary | ICD-10-CM | POA: Diagnosis not present

## 2022-09-12 DIAGNOSIS — K5900 Constipation, unspecified: Secondary | ICD-10-CM | POA: Diagnosis not present

## 2022-09-12 DIAGNOSIS — E1122 Type 2 diabetes mellitus with diabetic chronic kidney disease: Secondary | ICD-10-CM | POA: Diagnosis not present

## 2022-09-12 DIAGNOSIS — M170 Bilateral primary osteoarthritis of knee: Secondary | ICD-10-CM | POA: Diagnosis not present

## 2022-09-12 DIAGNOSIS — I5033 Acute on chronic diastolic (congestive) heart failure: Secondary | ICD-10-CM | POA: Diagnosis not present

## 2022-09-12 DIAGNOSIS — D61818 Other pancytopenia: Secondary | ICD-10-CM | POA: Diagnosis not present

## 2022-09-12 DIAGNOSIS — I2510 Atherosclerotic heart disease of native coronary artery without angina pectoris: Secondary | ICD-10-CM | POA: Diagnosis not present

## 2022-09-12 DIAGNOSIS — M810 Age-related osteoporosis without current pathological fracture: Secondary | ICD-10-CM | POA: Diagnosis not present

## 2022-09-12 DIAGNOSIS — H538 Other visual disturbances: Secondary | ICD-10-CM | POA: Diagnosis not present

## 2022-09-12 DIAGNOSIS — E785 Hyperlipidemia, unspecified: Secondary | ICD-10-CM | POA: Diagnosis not present

## 2022-09-12 DIAGNOSIS — N179 Acute kidney failure, unspecified: Secondary | ICD-10-CM | POA: Diagnosis not present

## 2022-09-12 DIAGNOSIS — I700 Atherosclerosis of aorta: Secondary | ICD-10-CM | POA: Diagnosis not present

## 2022-09-12 DIAGNOSIS — I440 Atrioventricular block, first degree: Secondary | ICD-10-CM | POA: Diagnosis not present

## 2022-09-12 DIAGNOSIS — N184 Chronic kidney disease, stage 4 (severe): Secondary | ICD-10-CM | POA: Diagnosis not present

## 2022-09-12 DIAGNOSIS — J45909 Unspecified asthma, uncomplicated: Secondary | ICD-10-CM | POA: Diagnosis not present

## 2022-09-12 DIAGNOSIS — D631 Anemia in chronic kidney disease: Secondary | ICD-10-CM | POA: Diagnosis not present

## 2022-09-12 DIAGNOSIS — I350 Nonrheumatic aortic (valve) stenosis: Secondary | ICD-10-CM | POA: Diagnosis not present

## 2022-09-12 MED ORDER — NEBULIZER MASK ADULT MISC: 2.0000 [IU] | Freq: Once | 0 refills | Status: AC

## 2022-09-13 ENCOUNTER — Telehealth: Payer: Self-pay | Admitting: *Deleted

## 2022-09-13 ENCOUNTER — Telehealth: Payer: Self-pay

## 2022-09-13 DIAGNOSIS — J45909 Unspecified asthma, uncomplicated: Secondary | ICD-10-CM

## 2022-09-13 NOTE — Telephone Encounter (Signed)
Pt daughter is requesting a call back ... She stated she was to call back with more info about her mom .Marland KitchenMarland Kitchen403 [phone]

## 2022-09-13 NOTE — Telephone Encounter (Signed)
RTC to daughter.  Patient did not receive a Nebulizer machine.  Was given medication for the Nebulizer only. Needs the Nebulizer.  Message sent to Dr. Sloan Leiter who has dne an order for the Nebulizer.[name]

## 2022-09-13 NOTE — Telephone Encounter (Signed)
Order placed for nebulizer and DME referral

## 2022-09-16 DIAGNOSIS — M810 Age-related osteoporosis without current pathological fracture: Secondary | ICD-10-CM | POA: Diagnosis not present

## 2022-09-16 DIAGNOSIS — H538 Other visual disturbances: Secondary | ICD-10-CM | POA: Diagnosis not present

## 2022-09-16 DIAGNOSIS — I5033 Acute on chronic diastolic (congestive) heart failure: Secondary | ICD-10-CM | POA: Diagnosis not present

## 2022-09-16 DIAGNOSIS — D61818 Other pancytopenia: Secondary | ICD-10-CM | POA: Diagnosis not present

## 2022-09-16 DIAGNOSIS — D631 Anemia in chronic kidney disease: Secondary | ICD-10-CM | POA: Diagnosis not present

## 2022-09-16 DIAGNOSIS — N184 Chronic kidney disease, stage 4 (severe): Secondary | ICD-10-CM | POA: Diagnosis not present

## 2022-09-16 DIAGNOSIS — N179 Acute kidney failure, unspecified: Secondary | ICD-10-CM | POA: Diagnosis not present

## 2022-09-16 DIAGNOSIS — I350 Nonrheumatic aortic (valve) stenosis: Secondary | ICD-10-CM | POA: Diagnosis not present

## 2022-09-16 DIAGNOSIS — K219 Gastro-esophageal reflux disease without esophagitis: Secondary | ICD-10-CM | POA: Diagnosis not present

## 2022-09-16 DIAGNOSIS — E1122 Type 2 diabetes mellitus with diabetic chronic kidney disease: Secondary | ICD-10-CM | POA: Diagnosis not present

## 2022-09-16 DIAGNOSIS — K5900 Constipation, unspecified: Secondary | ICD-10-CM | POA: Diagnosis not present

## 2022-09-16 DIAGNOSIS — E785 Hyperlipidemia, unspecified: Secondary | ICD-10-CM | POA: Diagnosis not present

## 2022-09-16 DIAGNOSIS — J45909 Unspecified asthma, uncomplicated: Secondary | ICD-10-CM | POA: Diagnosis not present

## 2022-09-16 DIAGNOSIS — I2510 Atherosclerotic heart disease of native coronary artery without angina pectoris: Secondary | ICD-10-CM | POA: Diagnosis not present

## 2022-09-16 DIAGNOSIS — I130 Hypertensive heart and chronic kidney disease with heart failure and stage 1 through stage 4 chronic kidney disease, or unspecified chronic kidney disease: Secondary | ICD-10-CM | POA: Diagnosis not present

## 2022-09-16 DIAGNOSIS — N2581 Secondary hyperparathyroidism of renal origin: Secondary | ICD-10-CM | POA: Diagnosis not present

## 2022-09-16 DIAGNOSIS — I051 Rheumatic mitral insufficiency: Secondary | ICD-10-CM | POA: Diagnosis not present

## 2022-09-16 DIAGNOSIS — I440 Atrioventricular block, first degree: Secondary | ICD-10-CM | POA: Diagnosis not present

## 2022-09-16 DIAGNOSIS — I700 Atherosclerosis of aorta: Secondary | ICD-10-CM | POA: Diagnosis not present

## 2022-09-16 DIAGNOSIS — M170 Bilateral primary osteoarthritis of knee: Secondary | ICD-10-CM | POA: Diagnosis not present

## 2022-09-16 DIAGNOSIS — K644 Residual hemorrhoidal skin tags: Secondary | ICD-10-CM | POA: Diagnosis not present

## 2022-09-17 ENCOUNTER — Other Ambulatory Visit: Payer: Self-pay

## 2022-09-17 ENCOUNTER — Telehealth: Payer: Self-pay | Admitting: *Deleted

## 2022-09-17 ENCOUNTER — Ambulatory Visit: Payer: 59 | Admitting: Student

## 2022-09-17 ENCOUNTER — Encounter: Payer: Self-pay | Admitting: Student

## 2022-09-17 VITALS — BP 160/76 | HR 77 | Temp 98.0°F | Ht 61.0 in

## 2022-09-17 DIAGNOSIS — N184 Chronic kidney disease, stage 4 (severe): Secondary | ICD-10-CM | POA: Insufficient documentation

## 2022-09-17 DIAGNOSIS — I5032 Chronic diastolic (congestive) heart failure: Secondary | ICD-10-CM | POA: Diagnosis not present

## 2022-09-17 DIAGNOSIS — Z87891 Personal history of nicotine dependence: Secondary | ICD-10-CM

## 2022-09-17 DIAGNOSIS — I350 Nonrheumatic aortic (valve) stenosis: Secondary | ICD-10-CM

## 2022-09-17 DIAGNOSIS — J45909 Unspecified asthma, uncomplicated: Secondary | ICD-10-CM | POA: Diagnosis not present

## 2022-09-17 HISTORY — DX: Chronic kidney disease, stage 4 (severe): N18.4

## 2022-09-17 NOTE — Assessment & Plan Note (Signed)
Recent 08/2022 discharge for acute on chroic diastolic HF 2/2 severe AS treated with IV lasix and toresemide PO. Discharge bed weight  171lb. Patient was discharged with Torsemide 40 mg daily and increase PRN to BID if worsening SOB or worsening peripheral edema, which she has been adherent too. In fact, after discussing patient's polypharmacy, her daughter mentioned they were not aware that patient needed to discontinue prior to hospital lasix 60 mg daily. Thus patient has been taking more loop diuretics than prescribed. She denies chest pain, shortness of breath, lightheadedness at rest or when working with physical therapy. She has noted improvement in her prior chronic orthopnea. She continues to have large volume urine output.   Her symptoms are currently controlled on the new regimen. Discussed new medication regimen after recent discharge. She will continue to monitor symptoms and only do extra dose of Torsemide 40 mg daily if new signs or symptoms of volume overload. -BMP today -Continue hydralazine 75 mg TID, Jardiance 10 mg daily, potassium chloride 10 mEq daily,  -Continue Torsemide 40 mg daily and extra 40 mg dose in the PM PRN

## 2022-09-17 NOTE — Assessment & Plan Note (Addendum)
Previously CKD4, admitted to the hospital with AKI on CKD likely in the setting of cardiorenal syndrome, which improved after continuous IV lasix infusion. Patient is still able to produce urine. No suprapubic tenderness and no signs of volume overload on exam. Discussed with nephrology service prior to discharge as patient had seen Dr. Signe Colt of [facility] in the past. At the time, pateint was still considering a TAVR and, as such, preoperative screening would put patient as risk to rapid advance to ESRD and dialysis. By the end of hospitalization, patient was no longer considered for elective TAVR. However, given high risk for CKD progression, patient will be seen at [facility] clinic on 09/19/2022 for continued follow up -Monitor RFP today -Continue Sevelamer carbonate TID with meals -Will follow up for records from [facility] when available

## 2022-09-17 NOTE — Assessment & Plan Note (Signed)
Chronic issue worked up during recent hospitalization secondary to CKD and vitamin D deficiency.  - Check RFP today - Continue Calcium supplementation TID

## 2022-09-17 NOTE — Assessment & Plan Note (Signed)
Previouly with paradoxical low flow/lowgradient severe AS and asymptomatic, now with high gradient of 50 mmHg severe AS. Seen prior to admission by Dr. Excell Seltzer for potential TAVR evaluation. After discussions with Dr. Excell Seltzer during inpatient, patient is no longer a candidate for elective TAVR given lack of mobility, likely progression to ESRD with HD needs during preop evaluation.  Plan: -Continue managing symptoms as detailed in HF assessment and plan -Patient is to follow up in Cardiology office on 8/2

## 2022-09-17 NOTE — Patient Instructions (Addendum)
Thank you, Ms.[name] for allowing [place] to provide your care today. Today we discussed your recent hospitalization, your follow up appointments and your medications. Please follow the medications instructions in your hospital after visit summary (the one I gave you today) closely.  I have ordered the following labs for you:  Lab Orders         Renal function panel         Magnesium       I will call if any are abnormal. If there are any acute changes to  your medications, I will let you know about them and send the medications to your preferred pharmacy. All of your labs can be accessed through "My Chart".   My Chart Access: [email or web address]  Please follow-up in: one month for close follow up of your heart failure symptoms and your kidney function    We look forward to seeing you next time. Please call our clinic at [phone] if you have any questions or concerns. The best time to call is Monday-Friday from 9am-4pm, but there is someone available 24/7. If after hours or the weekend, call the main hospital number and ask for the Internal Medicine Resident On-Call. If you need medication refills, please notify your pharmacy one week in advance and they will send [place] a request.   Thank you for letting us take part in your care. Wishing you the best!  Morene Crocker, MD 09/17/2022, 4:07 PM [facility] Resident, PGY2

## 2022-09-17 NOTE — Telephone Encounter (Signed)
Received call from Concha Se., RN with [facility]. Requesting VO for [facility] 1 week 4, and every other week x 2 weeks to work on CHF, and UTI. Also, requesting OT eval. Verbal auth given. Will route to Yellow Team for agreement/denial.

## 2022-09-17 NOTE — Progress Notes (Signed)
Subjective:  CC: HFU  HPI:  [name] is a 77 y.o. female with a past medical history stated below and presents today for hospital follow up after acute on chronic HF in setting of worsening severe AS c/b AKI on CKD IV and electrolyte abnormalities. Please see problem based assessment and plan for additional details.  Past Medical History:  Diagnosis Date   AKI (acute kidney injury) (HCC) 01/31/2020   Anemia    Aortic stenosis    Arthritis    Bilateral lower extremity edema    Bilateral lower extremity edema    Blurry vision, bilateral 05/08/2016   Bradycardia    a. 01/2013 - asymptomatic.   Chronic diastolic heart failure (HCC)    Chronic sinus bradycardia 07/02/2012   CKD (chronic kidney disease)    a. baseline CKD stage III   Coronary artery disease    Epilepsy (HCC) 06/04/2006   EEG 02/19/12 " Interpretation:  This is an abnormal EEG demonstrating continuous and generalized slowing of the background in the theta frequency. Irregular rhythm of EKG noted.  Clinical correlation: the generalized slowing is consistent with a mild encephalopathy of nonspecific etiologies for which the differential would include infectious, toxic, metabolic, inflammatory, and vascular etiologies.    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 -Orbits: Grossly unremarkable. -Paranasal sinuses: Imaged portions clear. -Brain: No acute abnormalities such as hemorrhage, hydrocephalus, acute ischemia, or evidence of  a mass lesion.  There is patchy bilateral cerebral white matter T2 and flair signal hyper intensities.  Centimeter or smaller CSF intensity   Mobitz (type) I (Wenckebach's) atrioventricular block    a. 01/2013 - asymptomatic.   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   TIA (transient ischemic attack) 2014   pt stated she had "mini strokes"   Vitamin D deficiency 04/22/2014    Current Outpatient Medications on File Prior to Visit  Medication Sig Dispense Refill   acetaminophen (TYLENOL) 325 MG tablet Take 2 tablets (650 mg total) by mouth every 6 (six) hours as needed for mild pain (or Fever >/= 101). 100 tablet 0   albuterol (PROVENTIL) (2.5 MG/3ML) 0.083% nebulizer solution Take 3 mLs (2.5 mg total) by nebulization every 6 (six) hours as needed for wheezing. 90 mL 12   aspirin 81 MG chewable tablet Chew 1 tablet (81 mg total) by mouth daily. 90 tablet 0   calcium carbonate (OSCAL) 1500 (600 Ca) MG TABS tablet Take 1 tablet (1,500 mg total) by mouth 3 (three) times daily with meals. 90 tablet 0   empagliflozin (JARDIANCE) 10 MG TABS tablet Take 1 tablet (10 mg total) by mouth daily. 30 tablet 0   febuxostat (ULORIC) 40 MG tablet Take 20 mg by mouth daily.     hydrALAZINE (APRESOLINE) 25 MG tablet Take 3 tablets (75 mg total)  by mouth every 8 (eight) hours. 270 tablet 0   Hydrocortisone (PREPARATION H EX) Apply 1 Application topically 2 (two) times daily as needed (Hemorrhoids).     lidocaine (LMX) 4 % cream Apply topically 2 (two) times daily as needed (Pain on sacral wound area). 30 g 0   multivitamin (RENA-VIT) TABS tablet Take 1 tablet by mouth at bedtime. 100 tablet 0   pantoprazole (PROTONIX) 40 MG tablet TAKE 1 TABLET BY MOUTH DAILY 100 tablet 2   PHENobarbital (LUMINAL) 64.8 MG tablet Take 2 tablets (129.6 mg total) by mouth  at bedtime. 180 tablet 3   potassium chloride (KLOR-CON M) 10 MEQ tablet Take 1 tablet (10 mEq total) by mouth once daily. 30 tablet 0   rosuvastatin (CRESTOR) 40 MG tablet Take 1 tablet (40 mg total) by mouth daily. 90 tablet 3   sevelamer carbonate (RENVELA) 800 MG tablet Take 1 tablet (800 mg total) by mouth 3 (three) times daily with meals. 90 tablet 0   torsemide (DEMADEX) 20 MG tablet Take 40 mg (2 tablets) daily and an extra dose (40mg ) in the PM for swelling or shortness of breath 120 tablet 2   Vitamin D, Ergocalciferol, (DRISDOL) 1.25 MG (50000 UNIT) CAPS capsule Take 1 capsule (50,000 Units total) by mouth every 7 (seven) days. 5 capsule 0   zonisamide (ZONEGRAN) 100 MG capsule TAKE 2 CAPSULES BY MOUTH AT  NIGHT 200 capsule 0   No current facility-administered medications on file prior to visit.    Family History  Problem Relation Age of Onset   Hypertension Other    Heart disease Mother    Hypertension Mother    Cancer Father    Obesity Son    Asthma Sister    Heart disease Sister    Heart disease Sister     Social History   Socioeconomic History   Marital status: Married    Spouse name: Not on file   Number of children: 2   Years of education: 4   Highest education level: Not on file  Occupational History   Occupation: retired  Tobacco Use   Smoking status: Former    Current packs/day: 0.00    Average packs/day: 0.5 packs/day for 10.0 years (5.0 ttl pk-yrs)    Types: Cigarettes    Start date: 06/04/1990    Quit date: 06/03/2000    Years since quitting: 22.3   Smokeless tobacco: Never  Vaping Use   Vaping status: Never Used  Substance and Sexual Activity   Alcohol use: No    Alcohol/week: 0.0 standard drinks of alcohol   Drug use: No   Sexual activity: Not Currently    Partners: Male  Other Topics Concern   Not on file  Social History Narrative   Current Social History 06/20/2020        Patient lives with spouse in a home which is 1 story. There are not  steps up to the entrance, the patient uses a ramp.       Patient's method of transportation is [employer].      The highest level of education was some high school.      The patient currently disabled.      Identified important Relationships are "God and my husband"       Pets : None       Interests / Fun: "Sitting on my front porch"       Current Stressors: None  Social Determinants of Health   Financial Resource Strain: Not on file  Food Insecurity: No Food Insecurity (09/11/2022)   Hunger Vital Sign    Worried About Running Out of Food in the Last Year: Never true    Ran Out of Food in the Last Year: Never true  Transportation Needs: No Transportation Needs (09/11/2022)   PRAPARE - Administrator, Civil Service (Medical): No    Lack of Transportation (Non-Medical): No  Physical Activity: Inactive (10/17/2021)   Exercise Vital Sign    Days of Exercise per Week: 0 days    Minutes of Exercise per Session: 0 min  Stress: No Stress Concern Present (10/17/2021)   [employer] of Occupational Health - Occupational Stress Questionnaire    Feeling of Stress : Only a little  Social Connections: Not on file  Intimate Partner Violence: Not At Risk (08/30/2022)   Humiliation, Afraid, Rape, and Kick questionnaire    Fear of Current or Ex-Partner: No    Emotionally Abused: No    Physically Abused: No    Sexually Abused: No    Review of Systems: ROS negative except for what is noted on the assessment and plan.  Objective:   Vitals:   09/17/22 1539  BP: (!) 160/76  Pulse: 77  Temp: 98 F (36.7 C)  TempSrc: Oral  SpO2: 100%  Height: 5\' 1"  (1.549 m)    Physical Exam: Constitutional: well-appearing elderly woman sitting in motorized wheelchair in NAD HENT mucous membranes moist Neck: supple Cardiovascular: regular rate and rhythm, 3/6 systolic murmur in RUSB, no extra gallops Pulmonary/Chest: normal work of breathing on room air, lungs clear to  auscultation bilaterally Abdominal: soft, non-tender, non-distended. No suprapubic tenderness QVZ:DGLOV extremities in compression stockings. No pitting edema above knees. No pitting edema in upper extremities Neurological: alert & oriented x 3 conversing appropriately Skin: warm and dry Psych: Pleasant mood and affect     Assessment & Plan:   Chronic diastolic congestive heart failure (HCC) Recent 08/2022 discharge for acute on chroic diastolic HF 2/2 severe AS treated with IV lasix and toresemide PO. Discharge bed weight  171lb. Patient was discharged with Torsemide 40 mg daily and increase PRN to BID if worsening SOB or worsening peripheral edema, which she has been adherent too. In fact, after discussing patient's polypharmacy, her daughter mentioned they were not aware that patient needed to discontinue prior to hospital lasix 60 mg daily. Thus patient has been taking more loop diuretics than prescribed. She denies chest pain, shortness of breath, lightheadedness at rest or when working with physical therapy. She has noted improvement in her prior chronic orthopnea. She continues to have large volume urine output.   Her symptoms are currently controlled on the new regimen. Discussed new medication regimen after recent discharge. She will continue to monitor symptoms and only do extra dose of Torsemide 40 mg daily if new signs or symptoms of volume overload. -BMP today -Continue hydralazine 75 mg TID, Jardiance 10 mg daily, potassium chloride 10 mEq daily,  -Continue Torsemide 40 mg daily and extra 40 mg dose in the PM PRN   Severe aortic stenosis Previouly with paradoxical low flow/lowgradient severe AS and asymptomatic, now with high gradient of 50 mmHg severe AS. Seen prior to admission by Dr. Excell Seltzer for potential TAVR evaluation. After discussions with Dr. Excell Seltzer during inpatient, patient is no longer a candidate for elective TAVR given lack of mobility, likely progression to ESRD with HD  needs during preop evaluation.  Plan: -  Continue managing symptoms as detailed in HF assessment and plan -Patient is to follow up in Cardiology office on 8/2  Hypocalcemia Chronic issue worked up during recent hospitalization secondary to CKD and vitamin D deficiency.  - Check RFP today - Continue Calcium supplementation TID  CKD (chronic kidney disease) stage 4, GFR 15-29 ml/min (HCC) Previously CKD4, admitted to the hospital with AKI on CKD likely in the setting of cardiorenal syndrome, which improved after continuous IV lasix infusion. Patient is still able to produce urine. No suprapubic tenderness and no signs of volume overload on exam. Discussed with nephrology service prior to discharge as patient had seen Dr. Signe Colt of [facility] in the past. At the time, pateint was still considering a TAVR and, as such, preoperative screening would put patient as risk to rapid advance to ESRD and dialysis. By the end of hospitalization, patient was no longer considered for elective TAVR. However, given high risk for CKD progression, patient will be seen at [facility] on 09/19/2022 for continued follow up -Monitor RFP today -Continue Sevelamer carbonate TID with meals -Will follow up for records from [facility] when available    Return in about 4 weeks (around 10/15/2022) for Close monitoring of patient's chronic medical conditions and medication reconciliation.  Patient discussed with Dr. Allyn Kenner, MD [facility] Internal Medicine Program - PGY-2 09/17/2022, 5:40 PM

## 2022-09-18 ENCOUNTER — Ambulatory Visit (INDEPENDENT_AMBULATORY_CARE_PROVIDER_SITE_OTHER): Payer: 59

## 2022-09-18 VITALS — Ht 61.0 in | Wt 171.0 lb

## 2022-09-18 DIAGNOSIS — Z0000 Encounter for general adult medical examination without abnormal findings: Secondary | ICD-10-CM

## 2022-09-18 DIAGNOSIS — Z780 Asymptomatic menopausal state: Secondary | ICD-10-CM

## 2022-09-18 NOTE — Progress Notes (Signed)
Subjective:   Sherry [name] is a 77 y.o. female who presents for Medicare Annual (Subsequent) preventive examination.  Visit Complete: Virtual  I connected with  [name] on 09/18/22 by a audio enabled telemedicine application and verified that I am speaking with the correct person using two identifiers.  Patient Location: Home  Provider Location: Office/Clinic  I discussed the limitations of evaluation and management by telemedicine. The patient expressed understanding and agreed to proceed.  Patient Medicare AWV questionnaire was completed by the patient on 08/30/2022; I have confirmed that all information answered by patient is correct and no changes since this date.  Review of Systems    Cardiac Risk Factors include: advanced age (>50men, >55 women);dyslipidemia;hypertension;obesity (BMI >30kg/m2);Other (see comment), Risk factor comments: CHF, CAD, Osteoporosis     Objective:    Today's Vitals   09/18/22 1038  Weight: 171 lb (77.6 kg)  Height: 5\' 1"  (1.549 m)  PainSc: 0-No pain   Body mass index is 32.31 kg/m.     09/18/2022   10:48 AM 08/30/2022    2:00 PM 08/27/2022   11:02 AM 06/14/2022   10:17 AM 03/19/2022   12:40 PM 02/27/2022   10:07 AM 11/28/2021    9:35 AM  Advanced Directives  Does Patient Have a Medical Advance Directive? No No Yes No No No No  Type of Advance Directive   Living will;Healthcare Power of Attorney      Does patient want to make changes to medical advance directive?  Yes (Inpatient - patient requests chaplain consult to change a medical advance directive) No - Patient declined      Copy of Healthcare Power of Attorney in Chart?   Yes - validated most recent copy scanned in chart (See row information)      Would patient like information on creating a medical advance directive? Yes (Inpatient - patient requests chaplain consult to create a medical advance directive) Yes (Inpatient - patient requests chaplain consult to create a  medical advance directive)  No - Patient declined  No - Patient declined No - Patient declined    Current Medications (verified) Outpatient Encounter Medications as of 09/18/2022  Medication Sig   acetaminophen (TYLENOL) 325 MG tablet Take 2 tablets (650 mg total) by mouth every 6 (six) hours as needed for mild pain (or Fever >/= 101).   albuterol (PROVENTIL) (2.5 MG/3ML) 0.083% nebulizer solution Take 3 mLs (2.5 mg total) by nebulization every 6 (six) hours as needed for wheezing.   aspirin 81 MG chewable tablet Chew 1 tablet (81 mg total) by mouth daily.   calcium carbonate (OSCAL) 1500 (600 Ca) MG TABS tablet Take 1 tablet (1,500 mg total) by mouth 3 (three) times daily with meals.   empagliflozin (JARDIANCE) 10 MG TABS tablet Take 1 tablet (10 mg total) by mouth daily.   febuxostat (ULORIC) 40 MG tablet Take 20 mg by mouth daily.   hydrALAZINE (APRESOLINE) 25 MG tablet Take 3 tablets (75 mg total) by mouth every 8 (eight) hours.   Hydrocortisone (PREPARATION H EX) Apply 1 Application topically 2 (two) times daily as needed (Hemorrhoids).   lidocaine (LMX) 4 % cream Apply topically 2 (two) times daily as needed (Pain on sacral wound area).   multivitamin (RENA-VIT) TABS tablet Take 1 tablet by mouth at bedtime.   pantoprazole (PROTONIX) 40 MG tablet TAKE 1 TABLET BY MOUTH DAILY   PHENobarbital (LUMINAL) 64.8 MG tablet Take 2 tablets (129.6 mg total) by mouth at bedtime.  potassium chloride (KLOR-CON M) 10 MEQ tablet Take 1 tablet (10 mEq total) by mouth once daily.   rosuvastatin (CRESTOR) 40 MG tablet Take 1 tablet (40 mg total) by mouth daily.   sevelamer carbonate (RENVELA) 800 MG tablet Take 1 tablet (800 mg total) by mouth 3 (three) times daily with meals.   torsemide (DEMADEX) 20 MG tablet Take 40 mg (2 tablets) daily and an extra dose (40mg ) in the PM for swelling or shortness of breath   Vitamin D, Ergocalciferol, (DRISDOL) 1.25 MG (50000 UNIT) CAPS capsule Take 1 capsule (50,000  Units total) by mouth every 7 (seven) days.   zonisamide (ZONEGRAN) 100 MG capsule TAKE 2 CAPSULES BY MOUTH AT  NIGHT   No facility-administered encounter medications on file as of 09/18/2022.    Allergies (verified) Ibuprofen and Lactose intolerance (gi)   History: Past Medical History:  Diagnosis Date   AKI (acute kidney injury) (HCC) 01/31/2020   Anemia    Aortic stenosis    Arthritis    Bilateral lower extremity edema    Bilateral lower extremity edema    Blurry vision, bilateral 05/08/2016   Bradycardia    a. 01/2013 - asymptomatic.   Chronic diastolic heart failure (HCC)    Chronic sinus bradycardia 07/02/2012   CKD (chronic kidney disease)    a. baseline CKD stage III   Coronary artery disease    Epilepsy (HCC) 06/04/2006   EEG 02/19/12 " Interpretation:  This is an abnormal EEG demonstrating continuous and generalized slowing of the background in the theta frequency. Irregular rhythm of EKG noted.  Clinical correlation: the generalized slowing is consistent with a mild encephalopathy of nonspecific etiologies for which the differential would include infectious, toxic, metabolic, inflammatory, and vascular etiologies.    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 -Orbits: Grossly unremarkable. -Paranasal sinuses: Imaged portions clear. -Brain: No acute abnormalities such as hemorrhage, hydrocephalus, acute ischemia, or evidence of a mass lesion.  There is patchy bilateral cerebral white matter T2 and  flair signal hyper intensities.  Centimeter or smaller CSF intensity   Mobitz (type) I (Wenckebach's) atrioventricular block    a. 01/2013 - asymptomatic.   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   TIA (transient ischemic attack) 2014   pt stated she had "mini strokes"   Vitamin D deficiency 04/22/2014   Past Surgical History:  Procedure Laterality Date   ABDOMINAL HYSTERECTOMY     LEFT AND RIGHT HEART CATHETERIZATION WITH CORONARY ANGIOGRAM N/A 05/24/2013   Procedure: LEFT AND RIGHT HEART CATHETERIZATION WITH CORONARY ANGIOGRAM;  Surgeon: Lesleigh Noe, MD;  Location: [facility] CATH LAB;  Service: Cardiovascular;  Laterality: N/A;   LEFT HEART CATH N/A 02/01/2020   Procedure: Left Heart Cath;  Surgeon: Dolores Patty, MD;  Location: [facility] INVASIVE CV LAB;  Service: Cardiovascular;  Laterality: N/A;   RIGHT HEART CATH N/A 02/01/2020   Procedure: RIGHT HEART CATH;  Surgeon: Dolores Patty, MD;  Location: [facility] INVASIVE CV LAB;  Service: Cardiovascular;  Laterality: N/A;   Family History  Problem Relation Age of Onset   Hypertension Other    Heart disease Mother    Hypertension Mother    Cancer Father    Obesity Son    Asthma Sister    Heart disease Sister    Heart disease Sister    Social History   Socioeconomic History   Marital status: Married    Spouse name: Johnny   Number of children: 2   Years of education: 4   Highest education level: Not on file  Occupational History   Occupation: retired  Tobacco Use   Smoking status: Former    Current packs/day: 0.00    Average packs/day: 0.5 packs/day for 10.0 years (5.0 ttl pk-yrs)    Types: Cigarettes    Start date: 06/04/1990    Quit date: 06/03/2000    Years since quitting: 22.3   Smokeless tobacco: Never  Vaping Use   Vaping status: Never Used  Substance and  Sexual Activity   Alcohol use: No    Alcohol/week: 0.0 standard drinks of alcohol   Drug use: No   Sexual activity: Not Currently    Partners: Male  Other Topics Concern   Not on file  Social History Narrative   Current Social History 06/20/2020        Patient lives with spouse in a home which is 1 story. There are not steps up to the entrance, the patient uses a ramp.       Patient's method of transportation is [employer].      The highest level of education was some high school.      The patient currently disabled.      Identified important Relationships are "God and my husband"       Pets : None       Interests / Fun: "Sitting on my front porch"       Current Stressors: None        Son passed away.      Social Determinants of Health   Financial Resource Strain: Low Risk  (09/18/2022)   Overall Financial Resource Strain (CARDIA)    Difficulty of Paying Living Expenses: Not hard at all  Food Insecurity: No Food Insecurity (09/18/2022)   Hunger Vital Sign    Worried About Running Out of Food in the Last Year: Never true    Ran Out of Food in the Last Year: Never true  Transportation Needs: No Transportation Needs (09/18/2022)   PRAPARE - Administrator, Civil Service (Medical): No    Lack of Transportation (Non-Medical): No  Physical Activity: Inactive (09/18/2022)   Exercise Vital Sign    Days of Exercise per Week: 0 days    Minutes of Exercise per Session: 0 min  Stress: No Stress Concern Present (09/18/2022)   [employer] of Occupational Health - Occupational Stress Questionnaire    Feeling of Stress : Not at all  Social Connections: Moderately Isolated (09/18/2022)   Social Connection and Isolation Panel [NHANES]    Frequency of Communication with Friends and Family: More than three times a week    Frequency of Social Gatherings with Friends and Family: Not on file    Attends Religious Services: Never    Active Member of Clubs or Organizations: No  Attends Banker Meetings: Never    Marital Status: Married    Tobacco Counseling Counseling given: Not Answered   Clinical Intake:  Pre-visit preparation completed: Yes  Pain : No/denies pain Pain Score: 0-No pain     BMI - recorded: 32.31 Nutritional Status: BMI > 30  Obese Nutritional Risks: None Diabetes: No     Interpreter Needed?: No  Information entered by :: Latika Kronick, RMA   Activities of Daily Living    09/18/2022   10:41 AM 08/30/2022    2:00 PM  In your present state of health, do you have any difficulty performing the following activities:  Hearing? 1 1  Comment sometimes difficulty hearing in Rt ear/comes and goes   Vision? 0 1  Difficulty concentrating or making decisions?  0  Walking or climbing stairs? 1 1  Dressing or bathing? 1 1  Doing errands, shopping? 0 0  Comment Uses the [employer] bus   Preparing Food and eating ? N   Using the Toilet? N   In the past six months, have you accidently leaked urine? N   Do you have problems with loss of bowel control? N   Managing your Medications? Y   Managing your Finances? N   Housekeeping or managing your Housekeeping? Y     Patient Care Team: Gwenevere Abbot, MD as PCP - Jerelene Redden, MD as PCP - Cardiology (Cardiology) Van Clines, MD as Consulting Physician (Neurology)  Indicate any recent Medical Services you may have received from other than [facility] providers in the past year (date may be approximate).     Assessment:   This is a routine wellness examination for [place].  Hearing/Vision screen Hearing Screening - Comments:: Sometimes in Rt ear  Dietary issues and exercise activities discussed:     Goals Addressed   None   Depression Screen    09/18/2022   10:54 AM 06/14/2022   10:17 AM 11/28/2021    9:36 AM 10/31/2021    1:42 PM 10/17/2021   10:29 AM 10/17/2021    9:34 AM 10/26/2020   11:45 AM  PHQ 2/9 Scores  PHQ - 2 Score 0 0 0 0 0 0 0  PHQ- 9 Score 1  0 0    0    Fall Risk    09/18/2022   10:48 AM 08/30/2022   10:35 AM 06/14/2022   10:17 AM 03/19/2022   12:39 PM 03/07/2022    9:43 AM  Fall Risk   Falls in the past year? 0 0 0 0 0  Number falls in past yr: 0 0 0 0 0  Injury with Fall? 0 0 0 0 0  Risk for fall due to : No Fall Risks  No Fall Risks  No Fall Risks  Follow up Falls prevention discussed Falls evaluation completed Falls evaluation completed;Falls prevention discussed Falls evaluation completed Falls evaluation completed    MEDICARE RISK AT HOME:  Medicare Risk at Home - 09/18/22 1048     Any stairs in or around the home? No    Home free of loose throw rugs in walkways, pet beds, electrical cords, etc? Yes    Adequate lighting in your home to reduce risk of falls? Yes    Life alert? No    Use of a cane, walker or w/c? Yes   uses ahoveround   Grab bars in the bathroom? No   per patient-can not get in bathroom   Shower chair or bench in shower? Yes  Elevated toilet seat or a handicapped toilet? Yes   uses bedside commode            TIMED UP AND GO:  Was the test performed?  No    Cognitive Function:        09/18/2022   10:50 AM 10/17/2021   10:29 AM  6CIT Screen  What Year? 0 points 0 points  What month? 0 points 0 points  What time? 3 points 0 points  Count back from 20 4 points 4 points  Months in reverse 4 points 4 points  Repeat phrase 10 points 2 points  Total Score 21 points 10 points    Immunizations Immunization History  Administered Date(s) Administered   Fluad Quad(high Dose 65+) 10/25/2021   Influenza Split 04/22/2011, 11/14/2011   Influenza Whole 12/17/2007, 12/19/2009   Influenza, High Dose Seasonal PF 11/11/2018   Influenza,inj,Quad PF,6+ Mos 10/31/2012, 10/11/2015, 12/09/2016, 12/03/2017, 10/27/2019, 10/26/2020   PFIZER(Purple Top)SARS-COV-2 Vaccination 11/11/2019, 12/02/2019   PNEUMOCOCCAL CONJUGATE-20 10/25/2021   Pneumococcal Conjugate-13 06/06/2014   Pneumococcal Polysaccharide-23  06/24/2011, 10/31/2012   Td 09/18/2009    TDAP status: Due, Education has been provided regarding the importance of this vaccine. Advised may receive this vaccine at local pharmacy or Health Dept. Aware to provide a copy of the vaccination record if obtained from local pharmacy or Health Dept. Verbalized acceptance and understanding.  Flu Vaccine status: Up to date  Pneumococcal vaccine status: Up to date  Covid-19 vaccine status: Completed vaccines  Qualifies for Shingles Vaccine? Yes   Zostavax completed No   Shingrix Completed?: No.    Education has been provided regarding the importance of this vaccine. Patient has been advised to call insurance company to determine out of pocket expense if they have not yet received this vaccine. Advised may also receive vaccine at local pharmacy or Health Dept. Verbalized acceptance and understanding.  Screening Tests Health Maintenance  Topic Date Due   Zoster Vaccines- Shingrix (1 of 2) Never done   DTaP/Tdap/Td (2 - Tdap) 09/19/2019   COVID-19 Vaccine (3 - 2023-24 season) 10/19/2021   INFLUENZA VACCINE  09/19/2022   Medicare Annual Wellness (AWV)  09/18/2023   Pneumonia Vaccine 83+ Years old  Completed   DEXA SCAN  Completed   Hepatitis C Screening  Completed   HPV VACCINES  Aged Out   Colonoscopy  Discontinued    Health Maintenance  Health Maintenance Due  Topic Date Due   Zoster Vaccines- Shingrix (1 of 2) Never done   DTaP/Tdap/Td (2 - Tdap) 09/19/2019   COVID-19 Vaccine (3 - 2023-24 season) 10/19/2021    Colorectal cancer screening: No longer required.   Mammogram status: No longer required due to age.  Bone Density status: Completed 07/04/2020. Results reflect: Bone density results: OSTEOPOROSIS. Repeat every 2 years.  Lung Cancer Screening: (Low Dose CT Chest recommended if Age 79-80 years, 20 pack-year currently smoking OR have quit w/in 15years.) does not qualify.   Lung Cancer Screening Referral: N/A  Additional  Screening:  Hepatitis C Screening: does qualify; Completed 07/04/2020  Vision Screening: Recommended annual ophthalmology exams for early detection of glaucoma and other disorders of the eye. Is the patient up to date with their annual eye exam?  No  Who is the provider or what is the name of the office in which the patient attends annual eye exams? N/A If pt is not established with a provider, would they like to be referred to a provider to establish care? Yes .  Dental Screening: Recommended annual dental exams for proper oral hygiene   Community Resource Referral / Chronic Care Management: CRR required this visit?  No   CCM required this visit?  No     Plan:     I have personally reviewed and noted the following in the patient's chart:   Medical and social history Use of alcohol, tobacco or illicit drugs  Current medications and supplements including opioid prescriptions. Patient is not currently taking opioid prescriptions. Functional ability and status Nutritional status Physical activity Advanced directives List of other physicians Hospitalizations, surgeries, and ER visits in previous 12 months Vitals Screenings to include cognitive, depression, and falls Referrals and appointments  In addition, I have reviewed and discussed with patient certain preventive protocols, quality metrics, and best practice recommendations. A written personalized care plan for preventive services as well as general preventive health recommendations were provided to patient.     [facility] Teegarden L Micaylah Bertucci, CMA   09/18/2022   After Visit Summary: (MyChart) Due to this being a telephonic visit, the after visit summary with patients personalized plan was offered to patient via MyChart   Nurse Notes: Patient is concerned about water intake, she wanted to know if it is safe for her to drink more water other then when she is taking her medication. I informed patient to continue with doctors order until her  follow up appointment. Patient is due for Tetanus Vaccine, second Shingrix, and a annual eye exam.  A referral has been placed For DEXA today.

## 2022-09-18 NOTE — Progress Notes (Signed)
Much improved kidney function and improvement in electrolyte derangements on follow up. Patient and patient's daughter aware. Will need follow up laboratory and medication reconciliation given polypharmacy at follow up.

## 2022-09-18 NOTE — Patient Instructions (Signed)
Sherry [name] , Thank you for taking time to come for your Medicare Wellness Visit. I appreciate your ongoing commitment to your health goals. Please review the following plan we discussed and let me know if I can assist you in the future.   Referrals/Orders/Follow-Ups/Clinician Recommendations: Remember to get your Tetanus vaccine at your local pharmacy or Health Department, and 2nd Shingrix dose.  Also remember to call and schedule your bone density screening, ([place] at [facility]).  Keep up the good work.  This is a list of the screening recommended for you and due dates:  Health Maintenance  Topic Date Due   Zoster (Shingles) Vaccine (1 of 2) Never done   DTaP/Tdap/Td vaccine (2 - Tdap) 09/19/2019   COVID-19 Vaccine (3 - 2023-24 season) 10/19/2021   Flu Shot  09/19/2022   Medicare Annual Wellness Visit  09/18/2023   Pneumonia Vaccine  Completed   DEXA scan (bone density measurement)  Completed   Hepatitis C Screening  Completed   HPV Vaccine  Aged Out   Colon Cancer Screening  Discontinued    Advanced directives: (Copy Requested) Please bring a copy of your health care power of attorney and living will to the office to be added to your chart at your convenience.  Next Medicare Annual Wellness Visit scheduled for next year: No  Preventive Care 76 Years and Older, Female Preventive care refers to lifestyle choices and visits with your health care provider that can promote health and wellness. What does preventive care include? A yearly physical exam. This is also called an annual well check. Dental exams once or twice a year. Routine eye exams. Ask your health care provider how often you should have your eyes checked. Personal lifestyle choices, including: Daily care of your teeth and gums. Regular physical activity. Eating a healthy diet. Avoiding tobacco and drug use. Limiting alcohol use. Practicing safe sex. Taking low-dose aspirin every day. Taking vitamin and mineral  supplements as recommended by your health care provider. What happens during an annual well check? The services and screenings done by your health care provider during your annual well check will depend on your age, overall health, lifestyle risk factors, and family history of disease. Counseling  Your health care provider may ask you questions about your: Alcohol use. Tobacco use. Drug use. Emotional well-being. Home and relationship well-being. Sexual activity. Eating habits. History of falls. Memory and ability to understand (cognition). Work and work Astronomer. Reproductive health. Screening  You may have the following tests or measurements: Height, weight, and BMI. Blood pressure. Lipid and cholesterol levels. These may be checked every 5 years, or more frequently if you are over 3 years old. Skin check. Lung cancer screening. You may have this screening every year starting at age 57 if you have a 30-pack-year history of smoking and currently smoke or have quit within the past 15 years. Fecal occult blood test (FOBT) of the stool. You may have this test every year starting at age 67. Flexible sigmoidoscopy or colonoscopy. You may have a sigmoidoscopy every 5 years or a colonoscopy every 10 years starting at age 67. Hepatitis C blood test. Hepatitis B blood test. Sexually transmitted disease (STD) testing. Diabetes screening. This is done by checking your blood sugar (glucose) after you have not eaten for a while (fasting). You may have this done every 1-3 years. Bone density scan. This is done to screen for osteoporosis. You may have this done starting at age 10. Mammogram. This may be done every 1-2  years. Talk to your health care provider about how often you should have regular mammograms. Talk with your health care provider about your test results, treatment options, and if necessary, the need for more tests. Vaccines  Your health care provider may recommend certain  vaccines, such as: Influenza vaccine. This is recommended every year. Tetanus, diphtheria, and acellular pertussis (Tdap, Td) vaccine. You may need a Td booster every 10 years. Zoster vaccine. You may need this after age 53. Pneumococcal 13-valent conjugate (PCV13) vaccine. One dose is recommended after age 33. Pneumococcal polysaccharide (PPSV23) vaccine. One dose is recommended after age 22. Talk to your health care provider about which screenings and vaccines you need and how often you need them. This information is not intended to replace advice given to you by your health care provider. Make sure you discuss any questions you have with your health care provider. Document Released: 03/03/2015 Document Revised: 10/25/2015 Document Reviewed: 12/06/2014 Elsevier Interactive Patient Education  2017 [employer].  Fall Prevention in the Home Falls can cause injuries. They can happen to people of all ages. There are many things you can do to make your home safe and to help prevent falls. What can I do on the outside of my home? Regularly fix the edges of walkways and driveways and fix any cracks. Remove anything that might make you trip as you walk through a door, such as a raised step or threshold. Trim any bushes or trees on the path to your home. Use bright outdoor lighting. Clear any walking paths of anything that might make someone trip, such as rocks or tools. Regularly check to see if handrails are loose or broken. Make sure that both sides of any steps have handrails. Any raised decks and porches should have guardrails on the edges. Have any leaves, snow, or ice cleared regularly. Use sand or salt on walking paths during winter. Clean up any spills in your garage right away. This includes oil or grease spills. What can I do in the bathroom? Use night lights. Install grab bars by the toilet and in the tub and shower. Do not use towel bars as grab bars. Use non-skid mats or decals in  the tub or shower. If you need to sit down in the shower, use a plastic, non-slip stool. Keep the floor dry. Clean up any water that spills on the floor as soon as it happens. Remove soap buildup in the tub or shower regularly. Attach bath mats securely with double-sided non-slip rug tape. Do not have throw rugs and other things on the floor that can make you trip. What can I do in the bedroom? Use night lights. Make sure that you have a light by your bed that is easy to reach. Do not use any sheets or blankets that are too big for your bed. They should not hang down onto the floor. Have a firm chair that has side arms. You can use this for support while you get dressed. Do not have throw rugs and other things on the floor that can make you trip. What can I do in the kitchen? Clean up any spills right away. Avoid walking on wet floors. Keep items that you use a lot in easy-to-reach places. If you need to reach something above you, use a strong step stool that has a grab bar. Keep electrical cords out of the way. Do not use floor polish or wax that makes floors slippery. If you must use wax, use non-skid floor  wax. Do not have throw rugs and other things on the floor that can make you trip. What can I do with my stairs? Do not leave any items on the stairs. Make sure that there are handrails on both sides of the stairs and use them. Fix handrails that are broken or loose. Make sure that handrails are as long as the stairways. Check any carpeting to make sure that it is firmly attached to the stairs. Fix any carpet that is loose or worn. Avoid having throw rugs at the top or bottom of the stairs. If you do have throw rugs, attach them to the floor with carpet tape. Make sure that you have a light switch at the top of the stairs and the bottom of the stairs. If you do not have them, ask someone to add them for you. What else can I do to help prevent falls? Wear shoes that: Do not have high  heels. Have rubber bottoms. Are comfortable and fit you well. Are closed at the toe. Do not wear sandals. If you use a stepladder: Make sure that it is fully opened. Do not climb a closed stepladder. Make sure that both sides of the stepladder are locked into place. Ask someone to hold it for you, if possible. Clearly mark and make sure that you can see: Any grab bars or handrails. First and last steps. Where the edge of each step is. Use tools that help you move around (mobility aids) if they are needed. These include: Canes. Walkers. Scooters. Crutches. Turn on the lights when you go into a dark area. Replace any light bulbs as soon as they burn out. Set up your furniture so you have a clear path. Avoid moving your furniture around. If any of your floors are uneven, fix them. If there are any pets around you, be aware of where they are. Review your medicines with your doctor. Some medicines can make you feel dizzy. This can increase your chance of falling. Ask your doctor what other things that you can do to help prevent falls. This information is not intended to replace advice given to you by your health care provider. Make sure you discuss any questions you have with your health care provider. Document Released: 12/01/2008 Document Revised: 07/13/2015 Document Reviewed: 03/11/2014 Elsevier Interactive Patient Education  2017 [employer].

## 2022-09-19 DIAGNOSIS — I440 Atrioventricular block, first degree: Secondary | ICD-10-CM | POA: Diagnosis not present

## 2022-09-19 DIAGNOSIS — N179 Acute kidney failure, unspecified: Secondary | ICD-10-CM | POA: Diagnosis not present

## 2022-09-19 DIAGNOSIS — E785 Hyperlipidemia, unspecified: Secondary | ICD-10-CM | POA: Diagnosis not present

## 2022-09-19 DIAGNOSIS — J45909 Unspecified asthma, uncomplicated: Secondary | ICD-10-CM | POA: Diagnosis not present

## 2022-09-19 DIAGNOSIS — I2510 Atherosclerotic heart disease of native coronary artery without angina pectoris: Secondary | ICD-10-CM | POA: Diagnosis not present

## 2022-09-19 DIAGNOSIS — D61818 Other pancytopenia: Secondary | ICD-10-CM | POA: Diagnosis not present

## 2022-09-19 DIAGNOSIS — I5033 Acute on chronic diastolic (congestive) heart failure: Secondary | ICD-10-CM | POA: Diagnosis not present

## 2022-09-19 DIAGNOSIS — I051 Rheumatic mitral insufficiency: Secondary | ICD-10-CM | POA: Diagnosis not present

## 2022-09-19 DIAGNOSIS — K5900 Constipation, unspecified: Secondary | ICD-10-CM | POA: Diagnosis not present

## 2022-09-19 DIAGNOSIS — E1122 Type 2 diabetes mellitus with diabetic chronic kidney disease: Secondary | ICD-10-CM | POA: Diagnosis not present

## 2022-09-19 DIAGNOSIS — I350 Nonrheumatic aortic (valve) stenosis: Secondary | ICD-10-CM | POA: Diagnosis not present

## 2022-09-19 DIAGNOSIS — M810 Age-related osteoporosis without current pathological fracture: Secondary | ICD-10-CM | POA: Diagnosis not present

## 2022-09-19 DIAGNOSIS — D631 Anemia in chronic kidney disease: Secondary | ICD-10-CM | POA: Diagnosis not present

## 2022-09-19 DIAGNOSIS — I130 Hypertensive heart and chronic kidney disease with heart failure and stage 1 through stage 4 chronic kidney disease, or unspecified chronic kidney disease: Secondary | ICD-10-CM | POA: Diagnosis not present

## 2022-09-19 DIAGNOSIS — K219 Gastro-esophageal reflux disease without esophagitis: Secondary | ICD-10-CM | POA: Diagnosis not present

## 2022-09-19 DIAGNOSIS — N2581 Secondary hyperparathyroidism of renal origin: Secondary | ICD-10-CM | POA: Diagnosis not present

## 2022-09-19 DIAGNOSIS — K644 Residual hemorrhoidal skin tags: Secondary | ICD-10-CM | POA: Diagnosis not present

## 2022-09-19 DIAGNOSIS — I700 Atherosclerosis of aorta: Secondary | ICD-10-CM | POA: Diagnosis not present

## 2022-09-19 DIAGNOSIS — N184 Chronic kidney disease, stage 4 (severe): Secondary | ICD-10-CM | POA: Diagnosis not present

## 2022-09-19 DIAGNOSIS — H538 Other visual disturbances: Secondary | ICD-10-CM | POA: Diagnosis not present

## 2022-09-19 DIAGNOSIS — M170 Bilateral primary osteoarthritis of knee: Secondary | ICD-10-CM | POA: Diagnosis not present

## 2022-09-20 ENCOUNTER — Encounter (HOSPITAL_COMMUNITY): Payer: Self-pay

## 2022-09-20 ENCOUNTER — Other Ambulatory Visit: Payer: Self-pay

## 2022-09-20 ENCOUNTER — Ambulatory Visit (HOSPITAL_COMMUNITY)
Admission: RE | Admit: 2022-09-20 | Discharge: 2022-09-20 | Disposition: A | Discharge status: Home / Self Care | Payer: 59 | Source: Ambulatory Visit | Attending: Family Medicine | Admitting: Family Medicine

## 2022-09-20 VITALS — BP 168/82 | HR 64

## 2022-09-20 DIAGNOSIS — Z87891 Personal history of nicotine dependence: Secondary | ICD-10-CM | POA: Diagnosis not present

## 2022-09-20 DIAGNOSIS — I2510 Atherosclerotic heart disease of native coronary artery without angina pectoris: Secondary | ICD-10-CM

## 2022-09-20 DIAGNOSIS — I350 Nonrheumatic aortic (valve) stenosis: Secondary | ICD-10-CM | POA: Diagnosis not present

## 2022-09-20 DIAGNOSIS — I10 Essential (primary) hypertension: Secondary | ICD-10-CM | POA: Diagnosis not present

## 2022-09-20 DIAGNOSIS — Z8249 Family history of ischemic heart disease and other diseases of the circulatory system: Secondary | ICD-10-CM | POA: Insufficient documentation

## 2022-09-20 DIAGNOSIS — N184 Chronic kidney disease, stage 4 (severe): Secondary | ICD-10-CM | POA: Diagnosis not present

## 2022-09-20 DIAGNOSIS — I130 Hypertensive heart and chronic kidney disease with heart failure and stage 1 through stage 4 chronic kidney disease, or unspecified chronic kidney disease: Secondary | ICD-10-CM | POA: Diagnosis not present

## 2022-09-20 DIAGNOSIS — M1990 Unspecified osteoarthritis, unspecified site: Secondary | ICD-10-CM | POA: Diagnosis not present

## 2022-09-20 DIAGNOSIS — Z7189 Other specified counseling: Secondary | ICD-10-CM | POA: Diagnosis not present

## 2022-09-20 DIAGNOSIS — I5032 Chronic diastolic (congestive) heart failure: Secondary | ICD-10-CM | POA: Insufficient documentation

## 2022-09-20 DIAGNOSIS — I440 Atrioventricular block, first degree: Secondary | ICD-10-CM | POA: Diagnosis not present

## 2022-09-20 DIAGNOSIS — R5381 Other malaise: Secondary | ICD-10-CM | POA: Diagnosis not present

## 2022-09-20 DIAGNOSIS — Z79899 Other long term (current) drug therapy: Secondary | ICD-10-CM | POA: Insufficient documentation

## 2022-09-20 MED ORDER — HYDRALAZINE HCL 25 MG PO TABS: 100.0000 mg | ORAL_TABLET | Freq: Three times a day (TID) | ORAL | 6 refills | Status: DC

## 2022-09-20 NOTE — Patient Instructions (Signed)
Thank you for coming in today  If you had labs drawn today, any labs that are abnormal the clinic will call you No news is good news  Medications: Increase Hydralazine to 100 mg 3 times daily   Follow up appointments:  Your physician recommends that you schedule a follow-up appointment in:  6 weeks in clinic   Do the following things EVERYDAY: Weigh yourself in the morning before breakfast. Write it down and keep it in a log. Take your medicines as prescribed Eat low salt foods--Limit salt (sodium) to 2000 mg per day.  Stay as active as you can everyday Limit all fluids for the day to less than 2 liters   At the [facility], you and your health needs are our priority. As part of our continuing mission to provide you with exceptional heart care, we have created designated Provider Care Teams. These Care Teams include your primary Cardiologist (physician) and Advanced Practice Providers (APPs- Physician Assistants and Nurse Practitioners) who all work together to provide you with the care you need, when you need it.   You may see any of the following providers on your designated Care Team at your next follow up: Dr Arvilla Meres Dr Marca Ancona Dr. Marcos Eke, NP Robbie Lis, [place] [facility] [place], [place] Brynda Peon, NP Karle Plumber, PharmD   Please be sure to bring in all your medications bottles to every appointment.    Thank you for choosing [facility]  If you have any questions or concerns before your next appointment please send [place] a message through [place] or call our office at [phone].    TO LEAVE A MESSAGE FOR THE NURSE SELECT OPTION 2, PLEASE LEAVE A MESSAGE INCLUDING: YOUR NAME DATE OF BIRTH CALL BACK NUMBER REASON FOR CALL**this is important as we prioritize the call backs  YOU WILL RECEIVE A CALL BACK THE SAME DAY AS LONG AS YOU CALL BEFORE 4:00 PM

## 2022-09-20 NOTE — Addendum Note (Signed)
Addended by: Burnell Blanks on: 09/20/2022 09:47 AM   Modules accepted: Level of Service

## 2022-09-20 NOTE — Progress Notes (Signed)
PCP: Gwenevere Abbot, MD Cardiology: Dr. Excell Seltzer HF Cardiology: Dr. Shirlee Latch  77 y.o. with history of aortic stenosis, chronic diastolic CHF, and CAD was referred by Dr. Excell Seltzer for evaluation of CHF.  Based on 12/21 echo and RHC/LHC in 12/21, the patient has moderate aortic stenosis. She had coronary angiography in 4/15 with rather diffuse moderate-severe coronary disease that was managed medically.  Cardiac MRI in 11/19 was concerning for cardiac amyloidosis. However, PYP scan was negative in 11/19.  Workup at that time also did not show a monoclonal protein to suggest AL amyloidosis.    Echo in 11/22 showed EF 60-65%, severe LVH, normal RV size and systolic function, mild MR, paradoxical low flow/low gradient severe aortic stenosis with mean gradient 24 mmHg and AVA 0.88 cm^2, small to moderate pericardial effusion.  PYP scan was repeated in 11/22, grade 1 with H/CL 1.07.  This is unlikely to be TTR cardiac amyloidosis.  Repeat urine immunofixation and myeloma panel were negative as well.  [facility] gene testing negative for hATTR mutation.   She was evaluated by hematology for pancytopenia.  This improved without intervention.   Follow up 07/26/21, BP markedly elevated and given 50 mg hydralazine PO in clinic and hydralazine increased to 50 mg tid.    Echo in 9/23 showed EF 60-65%, severe concentric LVH, normal RV, PASP 43, moderate pericardial effusion without tamponade, mild-moderate MR, moderate mitral stenosis with mean gradient 7 mmHg, severe low flow/low gradient AS mean gradient 30 with AVA 0.95 cm^2, IVC normal.   Follow up 10/23, NYHA II-III and mildly volume overloaded, Lasix increased to 60 daily.   Today she returns for HF follow up. Overall feeling well. She is able to clean her kitchen without dyspnea. She is mostly wheelchair bound due to painful arthritis. No dyspnea with ADLs or transfers out of her wheelchair. Denies palpitations, abnormal bleeding, CP, dizziness, edema, or  PND/Orthopnea. Appetite ok. No fever or chills. Weight at home 140 pounds. Taking all medications. BP at home 135-140/80-88.  Admitted 7/24 with a/c HF. Echo showed AS severe. Structural Team consulted and felt not TAVR candidate. Diuresed with IV lasix and GDMT titrated, limited by kidney function. She was seen by PMT and code updated to DNR, with plans for palliative care outpatient.  She was discharged home, weight 171 lbs.  Today she returns for post hospital HF follow up with her husband. Overall feeling fine. She remains WC-bound, but no dyspnea with upper arm movements. She is working with PT at home. She has some LE swelling. Denies palpitations, abnormal bleeding, CP, dizziness, edema, or PND/Orthopnea. Appetite ok. No fever or chills. She is unable to stand for weights today. Taking all medications, her daughter arranges her pillbox..   ECG (personally reviewed): SR 1AVB, PR 354 msec, 67 bpm  Labs (6/23): LDL 119 Labs (9/23): K 4.1, creatinine 2.11 Labs (11/23): K 3.3, creatinine 2.29 Labs (7/24): K 3.5, creatinine 2.65  PMH: 1. CKD stage 3 2. H/o seizure disorder 3. H/o TIA 4. H/o Mobitz type 1 2nd degree AVB 5. HTN 6. CAD: LHC (4/15) with 80% and 50% tandem stenoses in the proximal LAD, 80% mid LCx, 85% dLCx, diffuse mid-distal disease up to 90% in RCA.  Diffuse coronary disease, treated medically.  7. Aortic stenosis: Suspect moderate AS. - Echo (12/21): AoV mean gradient 32, AVA 1.2 cm^2.  - RHC/LHC (12/21): AoV mean gradient 22, AVA 1.47 cm^2.  - Echo (11/22): EF 60-65%, severe LVH, normal RV size and systolic function, mild MR,  paradoxical low flow/low gradient severe aortic stenosis with mean gradient 24 mmHg and AVA 0.88 cm^2, small to moderate pericardial effusion.  - Echo (7/24): severe AS, mean gradient worsened to 50 mmHg. 8. Chronic diastolic CHF:  - Cardiac MRI (10/27): EF 76%, moderate LVH, difficult to null LV myocardium with diffuse subendocardial LGE concerning  for possible cardiac amyloidosis, moderate pericardial effusion.  - PYP scan (11/19): grade 0, H/CL 1.08.  - Echo (12/21): EF 60-65%, moderate LVH, normal RV, moderate pericardial effusion, moderate AS with AoV mean gradient 32, AVA 1.2 cm^2.  - Echo (11/22): EF 60-65%, severe LVH, normal RV size and systolic function, mild MR, paradoxical low flow/low gradient severe aortic stenosis with mean gradient 24 mmHg and AVA 0.88 cm^2, small to moderate pericardial effusion.   - PYP scan (11/22): grade 1 with H/CL 1.07.  This is unlikely to be TTR cardiac amyloidosis. - [employer] gene testing negative for hATTR mutation.  - Echo (9/23): EF 60-65%, severe concentric LVH, normal RV, PASP 43, moderate pericardial effusion without tamponade, mild-moderate MR, moderate mitral stenosis with mean gradient 7 mmHg, severe low flow/low gradient AS mean gradient 30 with AVA 0.95 cm^2, IVC normal.  - Echo (7/24): EF 60-65%, severe LVH, normal RV, severe MAC, severe AS AVA 0.62 cm, AoV mean gradient 50 mmHg  9. Arthritis: Severe, limited to wheelchair.  10. Hyperlipidemia  Social History   Socioeconomic History   Marital status: Married    Spouse name: Johnny   Number of children: 2   Years of education: 4   Highest education level: Not on file  Occupational History   Occupation: retired  Tobacco Use   Smoking status: Former    Current packs/day: 0.00    Average packs/day: 0.5 packs/day for 10.0 years (5.0 ttl pk-yrs)    Types: Cigarettes    Start date: 06/04/1990    Quit date: 06/03/2000    Years since quitting: 22.3   Smokeless tobacco: Never  Vaping Use   Vaping status: Never Used  Substance and Sexual Activity   Alcohol use: No    Alcohol/week: 0.0 standard drinks of alcohol   Drug use: No   Sexual activity: Not Currently    Partners: Male  Other Topics Concern   Not on file  Social History Narrative   Current Social History 06/20/2020        Patient lives with spouse in a home which is 1  story. There are not steps up to the entrance, the patient uses a ramp.       Patient's method of transportation is [employer].      The highest level of education was some high school.      The patient currently disabled.      Identified important Relationships are "God and my husband"       Pets : None       Interests / Fun: "Sitting on my front porch"       Current Stressors: None        Son passed away.      Social Determinants of Health   Financial Resource Strain: Low Risk  (09/18/2022)   Overall Financial Resource Strain (CARDIA)    Difficulty of Paying Living Expenses: Not hard at all  Food Insecurity: No Food Insecurity (09/18/2022)   Hunger Vital Sign    Worried About Running Out of Food in the Last Year: Never true    Ran Out of Food in the Last Year: Never true  Transportation  Needs: No Transportation Needs (09/18/2022)   PRAPARE - Administrator, Civil Service (Medical): No    Lack of Transportation (Non-Medical): No  Physical Activity: Inactive (09/18/2022)   Exercise Vital Sign    Days of Exercise per Week: 0 days    Minutes of Exercise per Session: 0 min  Stress: No Stress Concern Present (09/18/2022)   [employer] of Occupational Health - Occupational Stress Questionnaire    Feeling of Stress : Not at all  Social Connections: Moderately Isolated (09/18/2022)   Social Connection and Isolation Panel [NHANES]    Frequency of Communication with Friends and Family: More than three times a week    Frequency of Social Gatherings with Friends and Family: Not on file    Attends Religious Services: Never    Database administrator or Organizations: No    Attends Banker Meetings: Never    Marital Status: Married  Catering manager Violence: Not At Risk (09/18/2022)   Humiliation, Afraid, Rape, and Kick questionnaire    Fear of Current or Ex-Partner: No    Emotionally Abused: No    Physically Abused: No    Sexually Abused: No   Family History   Problem Relation Age of Onset   Hypertension Other    Heart disease Mother    Hypertension Mother    Cancer Father    Obesity Son    Asthma Sister    Heart disease Sister    Heart disease Sister    ROS: All systems reviewed and negative except as per HPI.   Current Outpatient Medications  Medication Sig Dispense Refill   acetaminophen (TYLENOL) 325 MG tablet Take 2 tablets (650 mg total) by mouth every 6 (six) hours as needed for mild pain (or Fever >/= 101). 100 tablet 0   albuterol (PROVENTIL) (2.5 MG/3ML) 0.083% nebulizer solution Take 3 mLs (2.5 mg total) by nebulization every 6 (six) hours as needed for wheezing. 90 mL 12   aspirin 81 MG chewable tablet Chew 1 tablet (81 mg total) by mouth daily. 90 tablet 0   calcium carbonate (OSCAL) 1500 (600 Ca) MG TABS tablet Take 1 tablet (1,500 mg total) by mouth 3 (three) times daily with meals. 90 tablet 0   empagliflozin (JARDIANCE) 10 MG TABS tablet Take 1 tablet (10 mg total) by mouth daily. 30 tablet 0   febuxostat (ULORIC) 40 MG tablet Take 20 mg by mouth daily.     hydrALAZINE (APRESOLINE) 25 MG tablet Take 3 tablets (75 mg total) by mouth every 8 (eight) hours. 270 tablet 0   Hydrocortisone (PREPARATION H EX) Apply 1 Application topically 2 (two) times daily as needed (Hemorrhoids).     lidocaine (LMX) 4 % cream Apply topically 2 (two) times daily as needed (Pain on sacral wound area). 30 g 0   multivitamin (RENA-VIT) TABS tablet Take 1 tablet by mouth at bedtime. 100 tablet 0   pantoprazole (PROTONIX) 40 MG tablet TAKE 1 TABLET BY MOUTH DAILY 100 tablet 2   PHENobarbital (LUMINAL) 64.8 MG tablet Take 2 tablets (129.6 mg total) by mouth at bedtime. 180 tablet 3   potassium chloride (KLOR-CON M) 10 MEQ tablet Take 1 tablet (10 mEq total) by mouth once daily. 30 tablet 0   rosuvastatin (CRESTOR) 40 MG tablet Take 1 tablet (40 mg total) by mouth daily. 90 tablet 3   sevelamer carbonate (RENVELA) 800 MG tablet Take 1 tablet (800 mg  total) by mouth 3 (three) times daily with meals.  90 tablet 0   torsemide (DEMADEX) 20 MG tablet Take 40 mg (2 tablets) daily and an extra dose (40mg ) in the PM for swelling or shortness of breath 120 tablet 2   Vitamin D, Ergocalciferol, (DRISDOL) 1.25 MG (50000 UNIT) CAPS capsule Take 1 capsule (50,000 Units total) by mouth every 7 (seven) days. 5 capsule 0   zonisamide (ZONEGRAN) 100 MG capsule TAKE 2 CAPSULES BY MOUTH AT  NIGHT 200 capsule 0   No current facility-administered medications for this encounter.   Wt Readings from Last 3 Encounters:  09/18/22 77.6 kg (171 lb)  09/10/22 77.6 kg (171 lb 1.2 oz)  05/06/22 65.9 kg (145 lb 3.2 oz)   BP (!) 168/82   Pulse 64   SpO2 98%  Physical Exam General:  NAD. No resp difficulty, arrived in wheelchair, frail appearing HEENT: Normal Neck: Supple. No JVD. Carotids 2+ bilat; no bruits. No lymphadenopathy or thryomegaly appreciated. Cor: PMI nondisplaced. Regular rate & rhythm. No rubs, gallops, 3/6 SEM RUSB, obliterated S2 Lungs: Clear Abdomen: Soft, nontender, nondistended. No hepatosplenomegaly. No bruits or masses. Good bowel sounds. Extremities: No cyanosis, clubbing, rash, 1+ pre-tibial edema Neuro: Alert & oriented x 3, cranial nerves grossly intact. Moves all 4 extremities w/o difficulty. Affect pleasant.  Assessment/Plan 1. Aortic stenosis: 12/21 LHC/RHC and echo were suggestive of moderate aortic stenosis.  Repeat echo in 11/22 showed paradoxical low flow/low gradient severe aortic stenosis, echo in 9/23 was similar.  Echo this admission (7/24) with severe AS, gradient higher.  She has been seen by Dr. Excell Seltzer in the past for TAVR evaluation.  She is very limited in terms of activity, basically wheel-chair bound at baseline. She has a long 1st degree AVB and has episodes of type 1 2nd degree AVB.  She also has CKD stage IV.  TAVR could be done, but high risk for progression to ESRD and need for pacemaker.  She would have a hard time with  HD due to lack of mobility.   - Not a TAVR candidate. 2. CAD: Moderate-severe diffuse CAD on coronary angiography in 4/15. She would not be a candidate for CABG due to lack of mobility. She denies CP - Continue ASA 81.  - Continue Crestor. - If we were to pursue TAVR, would need coronary angiography.  This would be difficult with CKD stage IV.  3. CKD stage IV: Baseline creatinine around 2.5+, likely new baseline 3.4 - Recent labs with PCP (09/17/22) showed SCr 2.65 4. Chronic diastolic CHF: Cardiac MRI was concerning for cardiac amyloidosis (11/19), as was echo with moderate LVH and aortic stenosis. The aortic stenosis and the pericardial effusion seen by echo are also frequently seen with cardiac amyloidosis. She additionally has tingling/numbness in hands/feet that is consistent with peripheral neuropathy.  However, PYP scan in 11/19 was negative and myeloma workup was negative.  Repeat PYP scan in 11/22 was again negative and she did not have a transthyretin gene mutation on [employer] gene testing.  Myeloma workup was again negative in 10/22.  Therefore, cardiac amyloidosis seems unlikely and LVH may be due to HTN and aortic stenosis though delayed enhancement from MRI is more suggestive of infiltrative disease. Echo this admission shows EF 60-65%, severe LVH, normal RV, small to moderate pericardial effusion (unchanged), severe AS with mean gradient 50 mmHg (AS has been severe in past but mean gradient has progressed). NYHA IIb-III, functional status difficult due to being WC bound. She does not appear markedly volume overloaded today. - Continue Jardiance. Recent  labs showed SCr 2.65, K 3.5 - Continue torsemide 40 mg daily w/ extra 40 mg PRN. - No ARB/ARNi or spiro with CKD 5. HTN: BP elevated - Increase hydralazine to 10 mg tid. 6. Conduction abnormality: Long 1st degree AVB, also runs of type 1 2nd degree AVB have been noted.   - Avoid beta blockade.  - High risk for progressive HB with TAVR,  would likely end up with PPM. 7. Debility: 2/2 severe arthritis. Minimally mobile. This is her baseline.  8. GOC: Pt with worsening severe AS, conduction delay, diastolic CHF, known CAD medically managed and likely progressed, worsening CKD now stage IV/V, and minimal mobility. High risk of continued volume overload and ESRD without TAVR and high risk of ESRD and post TAVR heart block needing pacemaker with TAVR. I do not think she would do well on dialysis due to limited mobility.  - She is not reading for hospice, but she understands palliative management of her symptoms and preserving QOL is main goal.  Follow up in 4-6 weeks with APP for fluid and BP check.  Prince Rome, FNP-BC 09/20/22

## 2022-09-20 NOTE — Progress Notes (Signed)
Internal Medicine Clinic Attending  I was physically present during the key portions of the resident provided service and participated in the medical decision making of patient's management care. I reviewed pertinent patient test results.  The assessment, diagnosis, and plan were formulated together and I agree with the documentation in the resident's note.  , , MD  

## 2022-09-23 DIAGNOSIS — I051 Rheumatic mitral insufficiency: Secondary | ICD-10-CM | POA: Diagnosis not present

## 2022-09-23 DIAGNOSIS — D631 Anemia in chronic kidney disease: Secondary | ICD-10-CM | POA: Diagnosis not present

## 2022-09-23 DIAGNOSIS — H538 Other visual disturbances: Secondary | ICD-10-CM | POA: Diagnosis not present

## 2022-09-23 DIAGNOSIS — I440 Atrioventricular block, first degree: Secondary | ICD-10-CM | POA: Diagnosis not present

## 2022-09-23 DIAGNOSIS — N179 Acute kidney failure, unspecified: Secondary | ICD-10-CM | POA: Diagnosis not present

## 2022-09-23 DIAGNOSIS — N2581 Secondary hyperparathyroidism of renal origin: Secondary | ICD-10-CM | POA: Diagnosis not present

## 2022-09-23 DIAGNOSIS — I2510 Atherosclerotic heart disease of native coronary artery without angina pectoris: Secondary | ICD-10-CM | POA: Diagnosis not present

## 2022-09-23 DIAGNOSIS — N184 Chronic kidney disease, stage 4 (severe): Secondary | ICD-10-CM | POA: Diagnosis not present

## 2022-09-23 DIAGNOSIS — K219 Gastro-esophageal reflux disease without esophagitis: Secondary | ICD-10-CM | POA: Diagnosis not present

## 2022-09-23 DIAGNOSIS — I130 Hypertensive heart and chronic kidney disease with heart failure and stage 1 through stage 4 chronic kidney disease, or unspecified chronic kidney disease: Secondary | ICD-10-CM | POA: Diagnosis not present

## 2022-09-23 DIAGNOSIS — I5033 Acute on chronic diastolic (congestive) heart failure: Secondary | ICD-10-CM | POA: Diagnosis not present

## 2022-09-23 DIAGNOSIS — I350 Nonrheumatic aortic (valve) stenosis: Secondary | ICD-10-CM | POA: Diagnosis not present

## 2022-09-23 DIAGNOSIS — D61818 Other pancytopenia: Secondary | ICD-10-CM | POA: Diagnosis not present

## 2022-09-23 DIAGNOSIS — E785 Hyperlipidemia, unspecified: Secondary | ICD-10-CM | POA: Diagnosis not present

## 2022-09-23 DIAGNOSIS — J45909 Unspecified asthma, uncomplicated: Secondary | ICD-10-CM | POA: Diagnosis not present

## 2022-09-23 DIAGNOSIS — I700 Atherosclerosis of aorta: Secondary | ICD-10-CM | POA: Diagnosis not present

## 2022-09-23 DIAGNOSIS — K644 Residual hemorrhoidal skin tags: Secondary | ICD-10-CM | POA: Diagnosis not present

## 2022-09-23 DIAGNOSIS — E1122 Type 2 diabetes mellitus with diabetic chronic kidney disease: Secondary | ICD-10-CM | POA: Diagnosis not present

## 2022-09-23 DIAGNOSIS — M170 Bilateral primary osteoarthritis of knee: Secondary | ICD-10-CM | POA: Diagnosis not present

## 2022-09-23 DIAGNOSIS — K5900 Constipation, unspecified: Secondary | ICD-10-CM | POA: Diagnosis not present

## 2022-09-23 DIAGNOSIS — M810 Age-related osteoporosis without current pathological fracture: Secondary | ICD-10-CM | POA: Diagnosis not present

## 2022-09-25 DIAGNOSIS — H40013 Open angle with borderline findings, low risk, bilateral: Secondary | ICD-10-CM | POA: Diagnosis not present

## 2022-09-25 DIAGNOSIS — H25813 Combined forms of age-related cataract, bilateral: Secondary | ICD-10-CM | POA: Diagnosis not present

## 2022-09-30 DIAGNOSIS — K5900 Constipation, unspecified: Secondary | ICD-10-CM | POA: Diagnosis not present

## 2022-09-30 DIAGNOSIS — N184 Chronic kidney disease, stage 4 (severe): Secondary | ICD-10-CM | POA: Diagnosis not present

## 2022-09-30 DIAGNOSIS — D631 Anemia in chronic kidney disease: Secondary | ICD-10-CM | POA: Diagnosis not present

## 2022-09-30 DIAGNOSIS — K219 Gastro-esophageal reflux disease without esophagitis: Secondary | ICD-10-CM | POA: Diagnosis not present

## 2022-09-30 DIAGNOSIS — N2581 Secondary hyperparathyroidism of renal origin: Secondary | ICD-10-CM | POA: Diagnosis not present

## 2022-09-30 DIAGNOSIS — K644 Residual hemorrhoidal skin tags: Secondary | ICD-10-CM | POA: Diagnosis not present

## 2022-09-30 DIAGNOSIS — I5033 Acute on chronic diastolic (congestive) heart failure: Secondary | ICD-10-CM | POA: Diagnosis not present

## 2022-09-30 DIAGNOSIS — M170 Bilateral primary osteoarthritis of knee: Secondary | ICD-10-CM | POA: Diagnosis not present

## 2022-09-30 DIAGNOSIS — J45909 Unspecified asthma, uncomplicated: Secondary | ICD-10-CM | POA: Diagnosis not present

## 2022-09-30 DIAGNOSIS — N179 Acute kidney failure, unspecified: Secondary | ICD-10-CM | POA: Diagnosis not present

## 2022-09-30 DIAGNOSIS — E1122 Type 2 diabetes mellitus with diabetic chronic kidney disease: Secondary | ICD-10-CM | POA: Diagnosis not present

## 2022-09-30 DIAGNOSIS — I440 Atrioventricular block, first degree: Secondary | ICD-10-CM | POA: Diagnosis not present

## 2022-09-30 DIAGNOSIS — I700 Atherosclerosis of aorta: Secondary | ICD-10-CM | POA: Diagnosis not present

## 2022-09-30 DIAGNOSIS — M810 Age-related osteoporosis without current pathological fracture: Secondary | ICD-10-CM | POA: Diagnosis not present

## 2022-09-30 DIAGNOSIS — I350 Nonrheumatic aortic (valve) stenosis: Secondary | ICD-10-CM | POA: Diagnosis not present

## 2022-09-30 DIAGNOSIS — E785 Hyperlipidemia, unspecified: Secondary | ICD-10-CM | POA: Diagnosis not present

## 2022-09-30 DIAGNOSIS — I130 Hypertensive heart and chronic kidney disease with heart failure and stage 1 through stage 4 chronic kidney disease, or unspecified chronic kidney disease: Secondary | ICD-10-CM | POA: Diagnosis not present

## 2022-09-30 DIAGNOSIS — D61818 Other pancytopenia: Secondary | ICD-10-CM | POA: Diagnosis not present

## 2022-09-30 DIAGNOSIS — H538 Other visual disturbances: Secondary | ICD-10-CM | POA: Diagnosis not present

## 2022-09-30 DIAGNOSIS — I051 Rheumatic mitral insufficiency: Secondary | ICD-10-CM | POA: Diagnosis not present

## 2022-09-30 DIAGNOSIS — I2510 Atherosclerotic heart disease of native coronary artery without angina pectoris: Secondary | ICD-10-CM | POA: Diagnosis not present

## 2022-10-01 DIAGNOSIS — N2581 Secondary hyperparathyroidism of renal origin: Secondary | ICD-10-CM | POA: Diagnosis not present

## 2022-10-01 DIAGNOSIS — D61818 Other pancytopenia: Secondary | ICD-10-CM | POA: Diagnosis not present

## 2022-10-01 DIAGNOSIS — E785 Hyperlipidemia, unspecified: Secondary | ICD-10-CM | POA: Diagnosis not present

## 2022-10-01 DIAGNOSIS — I5033 Acute on chronic diastolic (congestive) heart failure: Secondary | ICD-10-CM | POA: Diagnosis not present

## 2022-10-01 DIAGNOSIS — K644 Residual hemorrhoidal skin tags: Secondary | ICD-10-CM | POA: Diagnosis not present

## 2022-10-01 DIAGNOSIS — N179 Acute kidney failure, unspecified: Secondary | ICD-10-CM | POA: Diagnosis not present

## 2022-10-01 DIAGNOSIS — K219 Gastro-esophageal reflux disease without esophagitis: Secondary | ICD-10-CM | POA: Diagnosis not present

## 2022-10-01 DIAGNOSIS — E1122 Type 2 diabetes mellitus with diabetic chronic kidney disease: Secondary | ICD-10-CM | POA: Diagnosis not present

## 2022-10-01 DIAGNOSIS — I440 Atrioventricular block, first degree: Secondary | ICD-10-CM | POA: Diagnosis not present

## 2022-10-01 DIAGNOSIS — H538 Other visual disturbances: Secondary | ICD-10-CM | POA: Diagnosis not present

## 2022-10-01 DIAGNOSIS — J45909 Unspecified asthma, uncomplicated: Secondary | ICD-10-CM | POA: Diagnosis not present

## 2022-10-01 DIAGNOSIS — M810 Age-related osteoporosis without current pathological fracture: Secondary | ICD-10-CM | POA: Diagnosis not present

## 2022-10-01 DIAGNOSIS — I700 Atherosclerosis of aorta: Secondary | ICD-10-CM | POA: Diagnosis not present

## 2022-10-01 DIAGNOSIS — I2510 Atherosclerotic heart disease of native coronary artery without angina pectoris: Secondary | ICD-10-CM | POA: Diagnosis not present

## 2022-10-01 DIAGNOSIS — D631 Anemia in chronic kidney disease: Secondary | ICD-10-CM | POA: Diagnosis not present

## 2022-10-01 DIAGNOSIS — I350 Nonrheumatic aortic (valve) stenosis: Secondary | ICD-10-CM | POA: Diagnosis not present

## 2022-10-01 DIAGNOSIS — K5900 Constipation, unspecified: Secondary | ICD-10-CM | POA: Diagnosis not present

## 2022-10-01 DIAGNOSIS — N184 Chronic kidney disease, stage 4 (severe): Secondary | ICD-10-CM | POA: Diagnosis not present

## 2022-10-01 DIAGNOSIS — I130 Hypertensive heart and chronic kidney disease with heart failure and stage 1 through stage 4 chronic kidney disease, or unspecified chronic kidney disease: Secondary | ICD-10-CM | POA: Diagnosis not present

## 2022-10-01 DIAGNOSIS — I051 Rheumatic mitral insufficiency: Secondary | ICD-10-CM | POA: Diagnosis not present

## 2022-10-01 DIAGNOSIS — M170 Bilateral primary osteoarthritis of knee: Secondary | ICD-10-CM | POA: Diagnosis not present

## 2022-10-02 ENCOUNTER — Ambulatory Visit: Payer: 59 | Admitting: Neurology

## 2022-10-02 ENCOUNTER — Encounter: Payer: Self-pay | Admitting: Neurology

## 2022-10-02 VITALS — BP 156/78 | HR 74

## 2022-10-02 DIAGNOSIS — G40209 Localization-related (focal) (partial) symptomatic epilepsy and epileptic syndromes with complex partial seizures, not intractable, without status epilepticus: Secondary | ICD-10-CM | POA: Diagnosis not present

## 2022-10-02 DIAGNOSIS — G629 Polyneuropathy, unspecified: Secondary | ICD-10-CM | POA: Diagnosis not present

## 2022-10-02 MED ORDER — ZONISAMIDE 100 MG PO CAPS: ORAL_CAPSULE | ORAL | 3 refills | Status: DC

## 2022-10-02 MED ORDER — PHENOBARBITAL 64.8 MG PO TABS: 129.6000 mg | ORAL_TABLET | Freq: Every day | ORAL | 3 refills | Status: DC

## 2022-10-02 NOTE — Progress Notes (Signed)
NEUROLOGY FOLLOW UP OFFICE NOTE  Sherry [name] [id number] 09/08/1945  HISTORY OF PRESENT ILLNESS: I had the pleasure of seeing Sherry [name] in follow-up in the neurology clinic on 10/02/2022.  The patient was last seen 7 months ago for right temporal lobe epilepsy. She is again accompanied by her husband who helps supplement the history today.  Records and images were personally reviewed where available.  She was admitted at [facility] for 10 days for acute heart failure, AKI on CKD, UTI,and electrolyte disturbances. She has been home and feeling better, she says she is not wheezing any more. She and her husband continue to deny any seizures, no convulsions since age 44, no focal seizures since 2018. She is on Zonisamide 200mg  at bedtime and Phenobarbital 64.8mg  2 tabs at bedtime. She has had worsening of GFR over the years, GFR had been in the 40s in 2015, she has been on Zonisamide since 2015 started at [facility]. Her GFR in 2021 was 38, Zonisamide reduced to 200mg  qhs.Last bloodwork done 08/2022 showed a creatinine of 2.65, GFR 18, stable from prior (within range of 16-22). She states that since hospitalization, family has been helping with medications. She feels a change in her lower extremities since then as well, she has been wheelchairbound for several years. EMG/ NCV of the left leg to evaluate left foot weakness. EMG suggestive of a chronic sensorimotor axonal polyneuropathy. Superimposed multilevel lumbosacral radiculopathies affecting the L3-S1 nerve root/segment may also be possible. She has declined lumbar MRI stating surgery had been offered but she is not interested. She denies any bowel/bladder dysfunction, no paresthesias.    History on Initial Assessment 05/20/2016: This is a pleasant 77 yo RH woman with a history of hypertension, hyperlipidemia, AV block, CAD, and seizures. Records from [facility] were reviewed. She started having seizures in the 8s (age 34s), initially she was  having generalized convulsions often preceded by a sensation of severe nausea, "sick to stomach," followed by eyes rolling up, shaking of both arms and legs with tongue bite. They were initially occurring every 2 weeks while on Dilantin, none since adding Phenobarbital. They report the last GTC was probably in her 59s (>10 years ago). Family would also note episodes of unresponsiveness where her head drops forward slightly and she does not respond. She would be amnestic of these and feel like she woke up from sleep. Her daughter previously reported she seemed confused afterwards. There is note that these resolved after phenobarbital reduction in 2013, however her husband reports occasional episodes where he would be talking to her, then he comes back in the room and she would look at him like they did not talk earlier, she would not understand and say "huh?" and he asks if she heard him. He last noticed this around 1.5 months ago, prior to that was 7-8 months ago. She continues to have the sensation of a sickening feeling every few months or so, these always seem to occur when she is in the bath. She had one 2 weeks ago, these last up to a minute, she denies any confusion or focal symptoms. She denies any olfactory/gustatory hallucinations, deja vu, focal numbness/tingling/weakness, myoclonic jerks. She notices her hand twitches sometimes. Her husband denies any nocturnal seizures.   She is currently taking Phenobarbital 64.8mg  2 tablets daily and Zonisamide 300mg  qhs. She was previously drowsy on higher doses of Phenobarbital, and with reduction of dose, she currently denies any side effects to her medications. She denies any headaches, dizziness,  diplopia, dysarthria/dysphagia, neck/back pain, bladder dysfunction. She has constipation. She needs a walker to ambulate and denies any falls. She denies any focal symptoms but reports tingling in both hands and weakness in both legs, no pain. She reports sleep is  good, and reports good compliance to medications. She reports a history of migraines, but these have resolved.   Epilepsy Risk Factors:  A maternal cousin had seizures when younger. Otherwise she had a normal birth and early development.  There is no history of febrile convulsions, CNS infections such as meningitis/encephalitis, significant traumatic brain injury, neurosurgical procedures.   Prior AEDs: Dilantin (ineffective) Laboratory Data: Phenobarbital level 03/13/16 - 28. BMP showed a creatinine of 1.32, BUN 28; vitamin D level in 09/2015 was 25.9 EEG per [facility] notes: mild diffuse slowing on routine EEG MRI per [facility] notes: showed evidence of old strokes 1-hour EEG: Occasional right temporal slowing, frequent right frontotemporal epileptiform discharges  PAST MEDICAL HISTORY: Past Medical History:  Diagnosis Date   AKI (acute kidney injury) (HCC) 01/31/2020   Anemia    Aortic stenosis    Arthritis    Bilateral lower extremity edema    Bilateral lower extremity edema    Blurry vision, bilateral 05/08/2016   Bradycardia    a. 01/2013 - asymptomatic.   Chronic diastolic heart failure (HCC)    Chronic sinus bradycardia 07/02/2012   CKD (chronic kidney disease)    a. baseline CKD stage III   Coronary artery disease    Epilepsy (HCC) 06/04/2006   EEG 02/19/12 " Interpretation:  This is an abnormal EEG demonstrating continuous and generalized slowing of the background in the theta frequency. Irregular rhythm of EKG noted.  Clinical correlation: the generalized slowing is consistent with a mild encephalopathy of nonspecific etiologies for which the differential would include infectious, toxic, metabolic, inflammatory, and vascular etiologies.    GERD (gastroesophageal reflux disease)    Headache(784.0)    Heart block AV second degree 02/15/2013   Heart murmur    Hyperlipidemia    Hypertension    Hypotension    Hypovolemic shock (HCC)    Internal and external hemorrhoids without  complication 01/20/2012   Colonoscopy in 2008 recs repeat in 5 years   Intractable focal epilepsy with impairment of consciousness (HCC) 01/02/2012   Late effects of cerebrovascular disease 01/23/2012   MRI Brain -Calvarium/skull base: No focal marrow replacing lesion suggestive of neoplasm. -Orbits: Grossly unremarkable. -Paranasal sinuses: Imaged portions clear. -Brain: No acute abnormalities such as hemorrhage, hydrocephalus, acute ischemia, or evidence of a mass lesion.  There is patchy bilateral cerebral white matter T2 and flair signal hyper intensities.  Centimeter or smaller CSF intensity   Mobitz (type) I (Wenckebach's) atrioventricular block    a. 01/2013 - asymptomatic.   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   TIA (transient ischemic attack) 2014   pt stated she had "mini strokes"   Vitamin D deficiency 04/22/2014    MEDICATIONS: Current Outpatient Medications on File Prior to Visit  Medication Sig Dispense Refill   acetaminophen (TYLENOL) 325 MG tablet Take 2 tablets (650 mg total) by mouth every 6 (six) hours as needed for mild pain (or Fever >/= 101). 100 tablet 0   albuterol (PROVENTIL) (2.5 MG/3ML) 0.083% nebulizer solution Take 3 mLs (2.5 mg total) by nebulization every 6 (six) hours  as needed for wheezing. 90 mL 12   aspirin 81 MG chewable tablet Chew 1 tablet (81 mg total) by mouth daily. 90 tablet 0   calcium carbonate (OSCAL) 1500 (600 Ca) MG TABS tablet Take 1 tablet (1,500 mg total) by mouth 3 (three) times daily with meals. 90 tablet 0   empagliflozin (JARDIANCE) 10 MG TABS tablet Take 1 tablet (10 mg total) by mouth daily. 30 tablet 0   febuxostat (ULORIC) 40 MG tablet Take 20 mg by mouth daily.     hydrALAZINE (APRESOLINE) 25 MG tablet Take 4 tablets (100 mg total) by mouth 3 (three) times daily. 120  tablet 6   Hydrocortisone (PREPARATION H EX) Apply 1 Application topically 2 (two) times daily as needed (Hemorrhoids).     lidocaine (LMX) 4 % cream Apply topically 2 (two) times daily as needed (Pain on sacral wound area). 30 g 0   multivitamin (RENA-VIT) TABS tablet Take 1 tablet by mouth at bedtime. 100 tablet 0   pantoprazole (PROTONIX) 40 MG tablet TAKE 1 TABLET BY MOUTH DAILY 100 tablet 2   PHENobarbital (LUMINAL) 64.8 MG tablet Take 2 tablets (129.6 mg total) by mouth at bedtime. 180 tablet 3   potassium chloride (KLOR-CON M) 10 MEQ tablet Take 1 tablet (10 mEq total) by mouth once daily. 30 tablet 0   rosuvastatin (CRESTOR) 40 MG tablet Take 1 tablet (40 mg total) by mouth daily. 90 tablet 3   sevelamer carbonate (RENVELA) 800 MG tablet Take 1 tablet (800 mg total) by mouth 3 (three) times daily with meals. 90 tablet 0   torsemide (DEMADEX) 20 MG tablet Take 40 mg (2 tablets) daily and an extra dose (40mg ) in the PM for swelling or shortness of breath 120 tablet 2   Vitamin D, Ergocalciferol, (DRISDOL) 1.25 MG (50000 UNIT) CAPS capsule Take 1 capsule (50,000 Units total) by mouth every 7 (seven) days. 5 capsule 0   zonisamide (ZONEGRAN) 100 MG capsule TAKE 2 CAPSULES BY MOUTH AT  NIGHT 200 capsule 0   No current facility-administered medications on file prior to visit.    ALLERGIES: Allergies  Allergen Reactions   Ibuprofen Other (See Comments)    Dr advised pt not to take ibuprofen   Lactose Intolerance (Gi) Diarrhea    FAMILY HISTORY: Family History  Problem Relation Age of Onset   Hypertension Other    Heart disease Mother    Hypertension Mother    Cancer Father    Obesity Son    Asthma Sister    Heart disease Sister    Heart disease Sister     SOCIAL HISTORY: Social History   Socioeconomic History   Marital status: Married    Spouse name: Johnny   Number of children: 2   Years of education: 4   Highest education level: Not on file  Occupational History    Occupation: retired  Tobacco Use   Smoking status: Former    Current packs/day: 0.00    Average packs/day: 0.5 packs/day for 10.0 years (5.0 ttl pk-yrs)    Types: Cigarettes    Start date: 06/04/1990    Quit date: 06/03/2000    Years since quitting: 22.3   Smokeless tobacco: Never  Vaping Use   Vaping status: Never Used  Substance and Sexual Activity   Alcohol use: No    Alcohol/week: 0.0 standard drinks of alcohol   Drug use: No   Sexual activity: Not Currently    Partners: Male  Other Topics Concern  Not on file  Social History Narrative   Current Social History 06/20/2020        Patient lives with spouse in a home which is 1 story. There are not steps up to the entrance, the patient uses a ramp.       Patient's method of transportation is [employer].      The highest level of education was some high school.      The patient currently disabled.      Identified important Relationships are "God and my husband"       Pets : None       Interests / Fun: "Sitting on my front porch"       Current Stressors: None        Son passed away.      Social Determinants of Health   Financial Resource Strain: Low Risk  (09/18/2022)   Overall Financial Resource Strain (CARDIA)    Difficulty of Paying Living Expenses: Not hard at all  Food Insecurity: No Food Insecurity (09/18/2022)   Hunger Vital Sign    Worried About Running Out of Food in the Last Year: Never true    Ran Out of Food in the Last Year: Never true  Transportation Needs: No Transportation Needs (09/18/2022)   PRAPARE - Administrator, Civil Service (Medical): No    Lack of Transportation (Non-Medical): No  Physical Activity: Inactive (09/18/2022)   Exercise Vital Sign    Days of Exercise per Week: 0 days    Minutes of Exercise per Session: 0 min  Stress: No Stress Concern Present (09/18/2022)   [employer] of Occupational Health - Occupational Stress Questionnaire    Feeling of Stress : Not at all   Social Connections: Moderately Isolated (09/18/2022)   Social Connection and Isolation Panel [NHANES]    Frequency of Communication with Friends and Family: More than three times a week    Frequency of Social Gatherings with Friends and Family: Not on file    Attends Religious Services: Never    Database administrator or Organizations: No    Attends Banker Meetings: Never    Marital Status: Married  Catering manager Violence: Not At Risk (09/18/2022)   Humiliation, Afraid, Rape, and Kick questionnaire    Fear of Current or Ex-Partner: No    Emotionally Abused: No    Physically Abused: No    Sexually Abused: No     PHYSICAL EXAM: Vitals:   10/02/22 0930 10/02/22 0934  BP: (!) 168/91 (!) 156/78  Pulse: 74   SpO2: 99%    General: No acute distress, on motorized wheelchair Head:  Normocephalic/atraumatic Skin/Extremities: No rash, no edema, left foot inverted (chronic) Neurological Exam: alert and awake. No aphasia or dysarthria. Fund of knowledge is appropriate.  ttention and concentration are normal.   Cranial nerves: Pupils equal, round. Extraocular movements intact with no nystagmus. Visual fields full. Shallow left nasolabial fold. Motor: Bulk and tone increased in both LE, muscle strength 5/5 on both UE, hip flexion, knee extension, 3/5 right foot dorsiflexion, 2/5 left foot eversion/dorsiflexion. Gait not tested.  IMPRESSION: This is a pleasant 77 yo RH woman with a history of  hypertension, hyperlipidemia, AV block, CAD, and right temporal lobe epilepsy. No convulsions since age 31, no focal seizures since 2018. She has had continued worsening kidney function since Zonisamide was started in 2015. Zonisamide dose has been reduced, her GFR has been stable, Zonisamide level is low normal. We again discussed  risks and benefits, she would like to stay on current doses of medications, continue Zonisamide 200mg  at bedtime and Phenobarbital 64.8mg  2 tabs at bedtime, refills  sent. She is aware that if GFR continues to worsen, would reduce Zonisamide to 100mg  qhs. She does not drive. Follow-up in 6 months, call for any changes.    Thank you for allowing me to participate in her care.  Please do not hesitate to call for any questions or concerns.   Patrcia Dolly, M.D.   CC: Dr. Welton Flakes

## 2022-10-02 NOTE — Patient Instructions (Signed)
Always a pleasure to see you. Continue all your medications. Follow-up in 6 months, call for any changes.   Seizure Precautions: 1. If medication has been prescribed for you to prevent seizures, take it exactly as directed.  Do not stop taking the medicine without talking to your doctor first, even if you have not had a seizure in a long time.   2. Avoid activities in which a seizure would cause danger to yourself or to others.  Don't operate dangerous machinery, swim alone, or climb in high or dangerous places, such as on ladders, roofs, or girders.  Do not drive unless your doctor says you may.  3. If you have any warning that you may have a seizure, lay down in a safe place where you can't hurt yourself.    4.  No driving for 6 months from last seizure, as per [facility].   Please refer to the following link on the Epilepsy Foundation of America's website for more information: http://www.epilepsyfoundation.org/answerplace/Social/driving/drivingu.cfm   5.  Maintain good sleep hygiene.   6.  Contact your doctor if you have any problems that may be related to the medicine you are taking.  7.  Call 911 and bring the patient back to the ED if:        A.  The seizure lasts longer than 5 minutes.       B.  The patient doesn't awaken shortly after the seizure  C.  The patient has new problems such as difficulty seeing, speaking or moving  D.  The patient was injured during the seizure  E.  The patient has a temperature over 102 F (39C)  F.  The patient vomited and now is having trouble breathing

## 2022-10-04 ENCOUNTER — Other Ambulatory Visit: Payer: Self-pay

## 2022-10-04 ENCOUNTER — Other Ambulatory Visit: Payer: Self-pay | Admitting: Student

## 2022-10-05 ENCOUNTER — Other Ambulatory Visit: Payer: Self-pay

## 2022-10-07 ENCOUNTER — Other Ambulatory Visit: Payer: Self-pay

## 2022-10-07 MED ORDER — ACETAMINOPHEN 325 MG PO TABS
650.0000 mg | ORAL_TABLET | Freq: Four times a day (QID) | ORAL | 0 refills | Status: DC | PRN
  Filled 2022-10-07: qty 100, 13d supply, fill #0

## 2022-10-07 MED ORDER — CALCIUM CARBONATE 1500 (600 CA) MG PO TABS
1.0000 | ORAL_TABLET | Freq: Three times a day (TID) | ORAL | 0 refills | Status: DC
  Filled 2022-10-07 – 2022-10-11 (×3): qty 90, 30d supply, fill #0

## 2022-10-07 MED ORDER — VITAMIN D (ERGOCALCIFEROL) 1.25 MG (50000 UNIT) PO CAPS
50000.0000 [IU] | ORAL_CAPSULE | ORAL | 0 refills | Status: DC
  Filled 2022-10-07: qty 5, 35d supply, fill #0

## 2022-10-07 MED ORDER — POTASSIUM CHLORIDE CRYS ER 10 MEQ PO TBCR
10.0000 meq | EXTENDED_RELEASE_TABLET | Freq: Every day | ORAL | 0 refills | Status: DC
  Filled 2022-10-07: qty 30, 30d supply, fill #0

## 2022-10-08 ENCOUNTER — Other Ambulatory Visit: Payer: Self-pay

## 2022-10-09 ENCOUNTER — Telehealth: Payer: Self-pay | Admitting: Neurology

## 2022-10-09 ENCOUNTER — Telehealth: Payer: Self-pay

## 2022-10-09 NOTE — Telephone Encounter (Signed)
RTC to patients daughter about Zonisamide that was ordered by Dr. Karel Jarvis for patient. Medication was dispensed to the patient.   Pharmacy flagged as it is contraindicated in patiens with Kidney problems.  Pharmacy was call to notify the patient's doctor.  Pharmacy to also contact Dr. Karel Jarvis as well.

## 2022-10-09 NOTE — Telephone Encounter (Signed)
Pt family member ( daughter ) left a vmail on doris vmail  about her meds ( she did not SAY which med ) is being held at the [facility] due to the pharmacy stating the dosage is to high

## 2022-10-09 NOTE — Telephone Encounter (Signed)
Patients pharmacy called stating that they need clarification about zonisamide 100mg  caps. Pharmacist states that the medication is contradicting to the patient

## 2022-10-10 NOTE — Telephone Encounter (Signed)
Pls let pharmacy know we are aware and monitoring this closely following her GFR. No change. Thanks

## 2022-10-10 NOTE — Telephone Encounter (Signed)
Pharmacy called because her GFR was low I seen in your note we would decrease if her GFR was lower do I need to call pt to decrease her zonisamide/

## 2022-10-10 NOTE — Telephone Encounter (Signed)
Pharmacy is releasing medication to the pt they are aware that we are monitoring this closely following her GFR no change in her medication at this time

## 2022-10-11 ENCOUNTER — Other Ambulatory Visit: Payer: Self-pay

## 2022-10-11 DIAGNOSIS — H538 Other visual disturbances: Secondary | ICD-10-CM | POA: Diagnosis not present

## 2022-10-11 DIAGNOSIS — I440 Atrioventricular block, first degree: Secondary | ICD-10-CM | POA: Diagnosis not present

## 2022-10-11 DIAGNOSIS — I051 Rheumatic mitral insufficiency: Secondary | ICD-10-CM | POA: Diagnosis not present

## 2022-10-11 DIAGNOSIS — N2581 Secondary hyperparathyroidism of renal origin: Secondary | ICD-10-CM | POA: Diagnosis not present

## 2022-10-11 DIAGNOSIS — I5033 Acute on chronic diastolic (congestive) heart failure: Secondary | ICD-10-CM | POA: Diagnosis not present

## 2022-10-11 DIAGNOSIS — I700 Atherosclerosis of aorta: Secondary | ICD-10-CM | POA: Diagnosis not present

## 2022-10-11 DIAGNOSIS — E785 Hyperlipidemia, unspecified: Secondary | ICD-10-CM | POA: Diagnosis not present

## 2022-10-11 DIAGNOSIS — M170 Bilateral primary osteoarthritis of knee: Secondary | ICD-10-CM | POA: Diagnosis not present

## 2022-10-11 DIAGNOSIS — I2510 Atherosclerotic heart disease of native coronary artery without angina pectoris: Secondary | ICD-10-CM | POA: Diagnosis not present

## 2022-10-11 DIAGNOSIS — K5900 Constipation, unspecified: Secondary | ICD-10-CM | POA: Diagnosis not present

## 2022-10-11 DIAGNOSIS — E1122 Type 2 diabetes mellitus with diabetic chronic kidney disease: Secondary | ICD-10-CM | POA: Diagnosis not present

## 2022-10-11 DIAGNOSIS — N184 Chronic kidney disease, stage 4 (severe): Secondary | ICD-10-CM | POA: Diagnosis not present

## 2022-10-11 DIAGNOSIS — M810 Age-related osteoporosis without current pathological fracture: Secondary | ICD-10-CM | POA: Diagnosis not present

## 2022-10-11 DIAGNOSIS — K219 Gastro-esophageal reflux disease without esophagitis: Secondary | ICD-10-CM | POA: Diagnosis not present

## 2022-10-11 DIAGNOSIS — M7989 Other specified soft tissue disorders: Secondary | ICD-10-CM | POA: Diagnosis not present

## 2022-10-11 DIAGNOSIS — I5031 Acute diastolic (congestive) heart failure: Secondary | ICD-10-CM | POA: Diagnosis not present

## 2022-10-11 DIAGNOSIS — D631 Anemia in chronic kidney disease: Secondary | ICD-10-CM | POA: Diagnosis not present

## 2022-10-11 DIAGNOSIS — N179 Acute kidney failure, unspecified: Secondary | ICD-10-CM | POA: Diagnosis not present

## 2022-10-11 DIAGNOSIS — I130 Hypertensive heart and chronic kidney disease with heart failure and stage 1 through stage 4 chronic kidney disease, or unspecified chronic kidney disease: Secondary | ICD-10-CM | POA: Diagnosis not present

## 2022-10-11 DIAGNOSIS — I350 Nonrheumatic aortic (valve) stenosis: Secondary | ICD-10-CM | POA: Diagnosis not present

## 2022-10-11 DIAGNOSIS — J45909 Unspecified asthma, uncomplicated: Secondary | ICD-10-CM | POA: Diagnosis not present

## 2022-10-11 DIAGNOSIS — D61818 Other pancytopenia: Secondary | ICD-10-CM | POA: Diagnosis not present

## 2022-10-11 DIAGNOSIS — K644 Residual hemorrhoidal skin tags: Secondary | ICD-10-CM | POA: Diagnosis not present

## 2022-10-14 ENCOUNTER — Other Ambulatory Visit: Payer: Self-pay | Admitting: Internal Medicine

## 2022-10-14 DIAGNOSIS — N184 Chronic kidney disease, stage 4 (severe): Secondary | ICD-10-CM

## 2022-10-14 MED ORDER — SEVELAMER CARBONATE 800 MG PO TABS
800.0000 mg | ORAL_TABLET | Freq: Three times a day (TID) | ORAL | 0 refills | Status: DC
Start: 2022-10-14 — End: 2022-11-04

## 2022-10-14 MED ORDER — CALCIUM CARBONATE 1500 (600 CA) MG PO TABS
1.0000 | ORAL_TABLET | Freq: Three times a day (TID) | ORAL | 0 refills | Status: DC
Start: 2022-10-14 — End: 2023-06-25

## 2022-10-15 ENCOUNTER — Ambulatory Visit: Payer: 59 | Admitting: Neurology

## 2022-10-15 ENCOUNTER — Other Ambulatory Visit (HOSPITAL_COMMUNITY): Payer: Self-pay

## 2022-10-15 ENCOUNTER — Other Ambulatory Visit: Payer: Self-pay

## 2022-10-15 DIAGNOSIS — K5900 Constipation, unspecified: Secondary | ICD-10-CM | POA: Diagnosis not present

## 2022-10-15 DIAGNOSIS — I2510 Atherosclerotic heart disease of native coronary artery without angina pectoris: Secondary | ICD-10-CM | POA: Diagnosis not present

## 2022-10-15 DIAGNOSIS — I130 Hypertensive heart and chronic kidney disease with heart failure and stage 1 through stage 4 chronic kidney disease, or unspecified chronic kidney disease: Secondary | ICD-10-CM | POA: Diagnosis not present

## 2022-10-15 DIAGNOSIS — E1122 Type 2 diabetes mellitus with diabetic chronic kidney disease: Secondary | ICD-10-CM | POA: Diagnosis not present

## 2022-10-15 DIAGNOSIS — N184 Chronic kidney disease, stage 4 (severe): Secondary | ICD-10-CM | POA: Diagnosis not present

## 2022-10-15 DIAGNOSIS — I5033 Acute on chronic diastolic (congestive) heart failure: Secondary | ICD-10-CM | POA: Diagnosis not present

## 2022-10-15 DIAGNOSIS — N179 Acute kidney failure, unspecified: Secondary | ICD-10-CM | POA: Diagnosis not present

## 2022-10-15 DIAGNOSIS — I700 Atherosclerosis of aorta: Secondary | ICD-10-CM | POA: Diagnosis not present

## 2022-10-15 DIAGNOSIS — I350 Nonrheumatic aortic (valve) stenosis: Secondary | ICD-10-CM | POA: Diagnosis not present

## 2022-10-15 DIAGNOSIS — M810 Age-related osteoporosis without current pathological fracture: Secondary | ICD-10-CM | POA: Diagnosis not present

## 2022-10-15 DIAGNOSIS — D631 Anemia in chronic kidney disease: Secondary | ICD-10-CM | POA: Diagnosis not present

## 2022-10-15 DIAGNOSIS — H538 Other visual disturbances: Secondary | ICD-10-CM | POA: Diagnosis not present

## 2022-10-15 DIAGNOSIS — I051 Rheumatic mitral insufficiency: Secondary | ICD-10-CM | POA: Diagnosis not present

## 2022-10-15 DIAGNOSIS — N2581 Secondary hyperparathyroidism of renal origin: Secondary | ICD-10-CM | POA: Diagnosis not present

## 2022-10-15 DIAGNOSIS — J45909 Unspecified asthma, uncomplicated: Secondary | ICD-10-CM | POA: Diagnosis not present

## 2022-10-15 DIAGNOSIS — K219 Gastro-esophageal reflux disease without esophagitis: Secondary | ICD-10-CM | POA: Diagnosis not present

## 2022-10-15 DIAGNOSIS — D61818 Other pancytopenia: Secondary | ICD-10-CM | POA: Diagnosis not present

## 2022-10-15 DIAGNOSIS — K644 Residual hemorrhoidal skin tags: Secondary | ICD-10-CM | POA: Diagnosis not present

## 2022-10-15 DIAGNOSIS — I440 Atrioventricular block, first degree: Secondary | ICD-10-CM | POA: Diagnosis not present

## 2022-10-15 DIAGNOSIS — M170 Bilateral primary osteoarthritis of knee: Secondary | ICD-10-CM | POA: Diagnosis not present

## 2022-10-15 DIAGNOSIS — E785 Hyperlipidemia, unspecified: Secondary | ICD-10-CM | POA: Diagnosis not present

## 2022-10-16 ENCOUNTER — Encounter: Payer: 59 | Admitting: Internal Medicine

## 2022-10-16 DIAGNOSIS — N2581 Secondary hyperparathyroidism of renal origin: Secondary | ICD-10-CM | POA: Diagnosis not present

## 2022-10-16 DIAGNOSIS — N189 Chronic kidney disease, unspecified: Secondary | ICD-10-CM | POA: Diagnosis not present

## 2022-10-16 DIAGNOSIS — I350 Nonrheumatic aortic (valve) stenosis: Secondary | ICD-10-CM | POA: Diagnosis not present

## 2022-10-16 DIAGNOSIS — D61818 Other pancytopenia: Secondary | ICD-10-CM | POA: Diagnosis not present

## 2022-10-16 DIAGNOSIS — I509 Heart failure, unspecified: Secondary | ICD-10-CM | POA: Diagnosis not present

## 2022-10-16 DIAGNOSIS — N184 Chronic kidney disease, stage 4 (severe): Secondary | ICD-10-CM | POA: Diagnosis not present

## 2022-10-17 ENCOUNTER — Telehealth: Payer: Self-pay | Admitting: *Deleted

## 2022-10-17 ENCOUNTER — Other Ambulatory Visit: Payer: Self-pay | Admitting: *Deleted

## 2022-10-17 ENCOUNTER — Other Ambulatory Visit: Payer: Self-pay

## 2022-10-17 ENCOUNTER — Other Ambulatory Visit (HOSPITAL_COMMUNITY): Payer: Self-pay

## 2022-10-17 ENCOUNTER — Other Ambulatory Visit: Payer: Self-pay | Admitting: Student

## 2022-10-17 NOTE — Telephone Encounter (Signed)
Call from patient's daughter [name] stating that prescriptions for Torsemide and Phenobarbital were not sent to the Pharmacy.  Call to Pharmacy spoke with Pharmacy Tech located prescriptions for patient anda are filling them now.  RTC to [place] informed her that prescriptions will be ready for pick up today for her mother.

## 2022-10-20 ENCOUNTER — Other Ambulatory Visit (HOSPITAL_COMMUNITY): Payer: Self-pay | Admitting: Cardiology

## 2022-10-23 DIAGNOSIS — N184 Chronic kidney disease, stage 4 (severe): Secondary | ICD-10-CM | POA: Diagnosis not present

## 2022-10-23 DIAGNOSIS — D631 Anemia in chronic kidney disease: Secondary | ICD-10-CM | POA: Diagnosis not present

## 2022-10-23 DIAGNOSIS — E785 Hyperlipidemia, unspecified: Secondary | ICD-10-CM | POA: Diagnosis not present

## 2022-10-23 DIAGNOSIS — K219 Gastro-esophageal reflux disease without esophagitis: Secondary | ICD-10-CM | POA: Diagnosis not present

## 2022-10-23 DIAGNOSIS — I5033 Acute on chronic diastolic (congestive) heart failure: Secondary | ICD-10-CM | POA: Diagnosis not present

## 2022-10-23 DIAGNOSIS — H538 Other visual disturbances: Secondary | ICD-10-CM | POA: Diagnosis not present

## 2022-10-23 DIAGNOSIS — E1122 Type 2 diabetes mellitus with diabetic chronic kidney disease: Secondary | ICD-10-CM | POA: Diagnosis not present

## 2022-10-23 DIAGNOSIS — K5900 Constipation, unspecified: Secondary | ICD-10-CM | POA: Diagnosis not present

## 2022-10-23 DIAGNOSIS — I440 Atrioventricular block, first degree: Secondary | ICD-10-CM | POA: Diagnosis not present

## 2022-10-23 DIAGNOSIS — J45909 Unspecified asthma, uncomplicated: Secondary | ICD-10-CM | POA: Diagnosis not present

## 2022-10-23 DIAGNOSIS — N179 Acute kidney failure, unspecified: Secondary | ICD-10-CM | POA: Diagnosis not present

## 2022-10-23 DIAGNOSIS — I700 Atherosclerosis of aorta: Secondary | ICD-10-CM | POA: Diagnosis not present

## 2022-10-23 DIAGNOSIS — N2581 Secondary hyperparathyroidism of renal origin: Secondary | ICD-10-CM | POA: Diagnosis not present

## 2022-10-23 DIAGNOSIS — K644 Residual hemorrhoidal skin tags: Secondary | ICD-10-CM | POA: Diagnosis not present

## 2022-10-23 DIAGNOSIS — I130 Hypertensive heart and chronic kidney disease with heart failure and stage 1 through stage 4 chronic kidney disease, or unspecified chronic kidney disease: Secondary | ICD-10-CM | POA: Diagnosis not present

## 2022-10-23 DIAGNOSIS — I2510 Atherosclerotic heart disease of native coronary artery without angina pectoris: Secondary | ICD-10-CM | POA: Diagnosis not present

## 2022-10-23 DIAGNOSIS — I350 Nonrheumatic aortic (valve) stenosis: Secondary | ICD-10-CM | POA: Diagnosis not present

## 2022-10-23 DIAGNOSIS — D61818 Other pancytopenia: Secondary | ICD-10-CM | POA: Diagnosis not present

## 2022-10-23 DIAGNOSIS — I051 Rheumatic mitral insufficiency: Secondary | ICD-10-CM | POA: Diagnosis not present

## 2022-10-23 DIAGNOSIS — M170 Bilateral primary osteoarthritis of knee: Secondary | ICD-10-CM | POA: Diagnosis not present

## 2022-10-23 DIAGNOSIS — M810 Age-related osteoporosis without current pathological fracture: Secondary | ICD-10-CM | POA: Diagnosis not present

## 2022-10-29 ENCOUNTER — Encounter: Payer: Self-pay | Admitting: Student

## 2022-10-29 ENCOUNTER — Ambulatory Visit (INDEPENDENT_AMBULATORY_CARE_PROVIDER_SITE_OTHER): Payer: 59 | Admitting: Student

## 2022-10-29 ENCOUNTER — Other Ambulatory Visit: Payer: Self-pay

## 2022-10-29 VITALS — BP 184/72 | HR 63 | Temp 98.0°F | Ht 61.0 in

## 2022-10-29 DIAGNOSIS — L0291 Cutaneous abscess, unspecified: Secondary | ICD-10-CM

## 2022-10-29 DIAGNOSIS — I10 Essential (primary) hypertension: Secondary | ICD-10-CM | POA: Diagnosis not present

## 2022-10-29 DIAGNOSIS — Z23 Encounter for immunization: Secondary | ICD-10-CM | POA: Diagnosis not present

## 2022-10-29 MED ORDER — DOXYCYCLINE HYCLATE 100 MG PO CAPS: 100.0000 mg | ORAL_CAPSULE | Freq: Two times a day (BID) | ORAL | 0 refills | Status: DC

## 2022-10-29 NOTE — Patient Instructions (Addendum)
Thank you so much for coming to the clinic today!   I am sending in an antibiotic for the abscess.  I would like to see you back in about a week or so once you finish antibiotic treatment so we can see if we need to add something else to antibiotics.  If you have any questions please feel free to the call the clinic at anytime at [phone]. It was a pleasure seeing you!  Best, Dr. Thomasene Ripple

## 2022-10-30 DIAGNOSIS — D61818 Other pancytopenia: Secondary | ICD-10-CM | POA: Diagnosis not present

## 2022-10-30 DIAGNOSIS — K219 Gastro-esophageal reflux disease without esophagitis: Secondary | ICD-10-CM | POA: Diagnosis not present

## 2022-10-30 DIAGNOSIS — L0291 Cutaneous abscess, unspecified: Secondary | ICD-10-CM

## 2022-10-30 DIAGNOSIS — I051 Rheumatic mitral insufficiency: Secondary | ICD-10-CM | POA: Diagnosis not present

## 2022-10-30 DIAGNOSIS — I350 Nonrheumatic aortic (valve) stenosis: Secondary | ICD-10-CM | POA: Diagnosis not present

## 2022-10-30 DIAGNOSIS — J45909 Unspecified asthma, uncomplicated: Secondary | ICD-10-CM | POA: Diagnosis not present

## 2022-10-30 DIAGNOSIS — I2510 Atherosclerotic heart disease of native coronary artery without angina pectoris: Secondary | ICD-10-CM | POA: Diagnosis not present

## 2022-10-30 DIAGNOSIS — N2581 Secondary hyperparathyroidism of renal origin: Secondary | ICD-10-CM | POA: Diagnosis not present

## 2022-10-30 DIAGNOSIS — E785 Hyperlipidemia, unspecified: Secondary | ICD-10-CM | POA: Diagnosis not present

## 2022-10-30 DIAGNOSIS — D631 Anemia in chronic kidney disease: Secondary | ICD-10-CM | POA: Diagnosis not present

## 2022-10-30 DIAGNOSIS — M170 Bilateral primary osteoarthritis of knee: Secondary | ICD-10-CM | POA: Diagnosis not present

## 2022-10-30 DIAGNOSIS — I440 Atrioventricular block, first degree: Secondary | ICD-10-CM | POA: Diagnosis not present

## 2022-10-30 DIAGNOSIS — H538 Other visual disturbances: Secondary | ICD-10-CM | POA: Diagnosis not present

## 2022-10-30 DIAGNOSIS — M810 Age-related osteoporosis without current pathological fracture: Secondary | ICD-10-CM | POA: Diagnosis not present

## 2022-10-30 DIAGNOSIS — K5900 Constipation, unspecified: Secondary | ICD-10-CM | POA: Diagnosis not present

## 2022-10-30 DIAGNOSIS — E1122 Type 2 diabetes mellitus with diabetic chronic kidney disease: Secondary | ICD-10-CM | POA: Diagnosis not present

## 2022-10-30 DIAGNOSIS — I130 Hypertensive heart and chronic kidney disease with heart failure and stage 1 through stage 4 chronic kidney disease, or unspecified chronic kidney disease: Secondary | ICD-10-CM | POA: Diagnosis not present

## 2022-10-30 DIAGNOSIS — K644 Residual hemorrhoidal skin tags: Secondary | ICD-10-CM | POA: Diagnosis not present

## 2022-10-30 DIAGNOSIS — N184 Chronic kidney disease, stage 4 (severe): Secondary | ICD-10-CM | POA: Diagnosis not present

## 2022-10-30 DIAGNOSIS — I5033 Acute on chronic diastolic (congestive) heart failure: Secondary | ICD-10-CM | POA: Diagnosis not present

## 2022-10-30 DIAGNOSIS — N179 Acute kidney failure, unspecified: Secondary | ICD-10-CM | POA: Diagnosis not present

## 2022-10-30 DIAGNOSIS — I700 Atherosclerosis of aorta: Secondary | ICD-10-CM | POA: Diagnosis not present

## 2022-10-30 HISTORY — DX: Cutaneous abscess, unspecified: L02.91

## 2022-10-30 NOTE — Progress Notes (Signed)
CC: Abscess  HPI:  [name] is a 77 y.o. female living with a history stated below and presents today for abscess. Please see problem based assessment and plan for additional details.  Past Medical History:  Diagnosis Date   AKI (acute kidney injury) (HCC) 01/31/2020   Anemia    Aortic stenosis    Arthritis    Bilateral lower extremity edema    Bilateral lower extremity edema    Blurry vision, bilateral 05/08/2016   Bradycardia    a. 01/2013 - asymptomatic.   Chronic diastolic heart failure (HCC)    Chronic sinus bradycardia 07/02/2012   CKD (chronic kidney disease)    a. baseline CKD stage III   Coronary artery disease    Epilepsy (HCC) 06/04/2006   EEG 02/19/12 " Interpretation:  This is an abnormal EEG demonstrating continuous and generalized slowing of the background in the theta frequency. Irregular rhythm of EKG noted.  Clinical correlation: the generalized slowing is consistent with a mild encephalopathy of nonspecific etiologies for which the differential would include infectious, toxic, metabolic, inflammatory, and vascular etiologies.    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 -Orbits: Grossly unremarkable. -Paranasal sinuses: Imaged portions clear. -Brain: No acute abnormalities such as hemorrhage, hydrocephalus, acute ischemia, or evidence of a mass lesion.  There is patchy bilateral cerebral white matter T2 and flair signal hyper intensities.  Centimeter or smaller CSF  intensity   Mobitz (type) I (Wenckebach's) atrioventricular block    a. 01/2013 - asymptomatic.   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   TIA (transient ischemic attack) 2014   pt stated she had "mini strokes"   Vitamin D deficiency 04/22/2014    Current Outpatient Medications on File Prior to Visit  Medication Sig Dispense Refill   acetaminophen (TYLENOL) 325 MG tablet Take 2 tablets (650 mg total) by mouth every 6 (six) hours as needed for mild pain (or Fever >/= 101). 100 tablet 0   albuterol (PROVENTIL) (2.5 MG/3ML) 0.083% nebulizer solution Take 3 mLs (2.5 mg total) by nebulization every 6 (six) hours as needed for wheezing. 90 mL 12   aspirin 81 MG chewable tablet Chew 1 tablet (81 mg total) by mouth daily. 90 tablet 0   calcium carbonate (OSCAL) 1500 (600 Ca) MG TABS tablet Take 1 tablet (1,500 mg total) by mouth 3 (three) times daily with meals. 90 tablet 0   empagliflozin (JARDIANCE) 10 MG TABS tablet Take 1 tablet (10 mg total) by mouth daily. 30 tablet 0   febuxostat (ULORIC) 40 MG tablet Take 20 mg by mouth daily.     hydrALAZINE (APRESOLINE) 25 MG tablet Take 4 tablets (100 mg total) by mouth 3 (three) times daily. 120 tablet 6   Hydrocortisone (PREPARATION H EX) Apply 1 Application topically 2 (two) times daily as needed (  Hemorrhoids).     lidocaine (LMX) 4 % cream Apply 1 Application topically 2 (two) times daily as needed (Pain on sacral wound area). 30 g 0   multivitamin (RENA-VIT) TABS tablet Take 1 tablet by mouth at bedtime. 100 tablet 0   pantoprazole (PROTONIX) 40 MG tablet TAKE 1 TABLET BY MOUTH DAILY 100 tablet 2   PHENobarbital (LUMINAL) 64.8 MG tablet Take 2 tablets (129.6 mg total) by mouth at bedtime. 200 tablet 3   potassium chloride (KLOR-CON M) 10 MEQ tablet Take 1 tablet (10 mEq total) by mouth  once daily. 30 tablet 0   rosuvastatin (CRESTOR) 40 MG tablet Take 1 tablet (40 mg total) by mouth daily. 90 tablet 3   sevelamer carbonate (RENVELA) 800 MG tablet Take 1 tablet (800 mg total) by mouth 3 (three) times daily with meals. 90 tablet 0   torsemide (DEMADEX) 20 MG tablet TAKE 2 TABLETS BY MOUTH DAILY AND AN EXTRA DOSE(2 TABLETS) IN THE PM FOR SWELLING OR SHORTNESS OF BREATH 180 tablet 3   Vitamin D, Ergocalciferol, (DRISDOL) 1.25 MG (50000 UNIT) CAPS capsule Take 1 capsule (50,000 Units total) by mouth every 7 (seven) days. 5 capsule 0   zonisamide (ZONEGRAN) 100 MG capsule TAKE 2 CAPSULES BY MOUTH AT NIGHT 200 capsule 3   No current facility-administered medications on file prior to visit.    Family History  Problem Relation Age of Onset   Hypertension Other    Heart disease Mother    Hypertension Mother    Cancer Father    Obesity Son    Asthma Sister    Heart disease Sister    Heart disease Sister     Social History   Socioeconomic History   Marital status: Married    Spouse name: Johnny   Number of children: 2   Years of education: 4   Highest education level: Not on file  Occupational History   Occupation: retired  Tobacco Use   Smoking status: Former    Current packs/day: 0.00    Average packs/day: 0.5 packs/day for 10.0 years (5.0 ttl pk-yrs)    Types: Cigarettes    Start date: 06/04/1990    Quit date: 06/03/2000    Years since quitting: 22.4   Smokeless tobacco: Never  Vaping Use   Vaping status: Never Used  Substance and Sexual Activity   Alcohol use: No    Alcohol/week: 0.0 standard drinks of alcohol   Drug use: No   Sexual activity: Not Currently    Partners: Male  Other Topics Concern   Not on file  Social History Narrative   Current Social History 06/20/2020        Patient lives with spouse in a home which is 1 story. There are not steps up to the entrance, the patient uses a ramp.       Patient's method of transportation is [employer].      The  highest level of education was some high school.      The patient currently disabled.      Identified important Relationships are "God and my husband"       Pets : None       Interests / Fun: "Sitting on my front porch"       Current Stressors: None        Son passed away.      Social Determinants of Health   Financial Resource Strain: Low Risk  (09/18/2022)   Overall Financial Resource Strain (CARDIA)  Difficulty of Paying Living Expenses: Not hard at all  Food Insecurity: No Food Insecurity (09/18/2022)   Hunger Vital Sign    Worried About Running Out of Food in the Last Year: Never true    Ran Out of Food in the Last Year: Never true  Transportation Needs: No Transportation Needs (09/18/2022)   PRAPARE - Administrator, Civil Service (Medical): No    Lack of Transportation (Non-Medical): No  Physical Activity: Inactive (09/18/2022)   Exercise Vital Sign    Days of Exercise per Week: 0 days    Minutes of Exercise per Session: 0 min  Stress: No Stress Concern Present (09/18/2022)   [employer] of Occupational Health - Occupational Stress Questionnaire    Feeling of Stress : Not at all  Social Connections: Moderately Isolated (09/18/2022)   Social Connection and Isolation Panel [NHANES]    Frequency of Communication with Friends and Family: More than three times a week    Frequency of Social Gatherings with Friends and Family: Not on file    Attends Religious Services: Never    Database administrator or Organizations: No    Attends Banker Meetings: Never    Marital Status: Married  Catering manager Violence: Not At Risk (09/18/2022)   Humiliation, Afraid, Rape, and Kick questionnaire    Fear of Current or Ex-Partner: No    Emotionally Abused: No    Physically Abused: No    Sexually Abused: No    Review of Systems: ROS negative except for what is noted on the assessment and plan.  Vitals:   10/29/22 1008  BP: (!) 184/72  Pulse: 63   Temp: 98 F (36.7 C)  TempSrc: Oral  SpO2: 100%  Height: 5\' 1"  (1.549 m)    Physical Exam: Constitutional: well-appearing female in no acute distress HENT: normocephalic atraumatic, mucous membranes moist, 1cmx1cm indurated abscess on right temporal region of head, draining pus on manipulation Eyes: conjunctiva non-erythematous Neck: supple, no lymphadenopathy Cardiovascular: regular rate and rhythm, no m/r/g Pulmonary/Chest: normal work of breathing on room air, lungs clear to auscultation bilaterally Skin: warm and dry, no abscesses or new rashes visualized on upper or lower extremities      Assessment & Plan:   Abscess Patient states for the last week she has noticed a small bump on the right side of her temple that has slowly increased in size and has been draining yellow pus.  She denies any new laundry detergents, any bug bites, any new shampoos, or facial washes.  She denies any headaches, systemic symptoms such as fevers or chills.  She states that in is painful to palpation.  On exam, there is a 1 cm x 1 cm abscess located on the right temple region, and is significantly tender to palpation and attempts to squeeze pus out.  Please see photo on physical exam.  Overall, symptoms concerning for an abscess.  Will treat with doxycycline 100 mg twice a day for a week.  Informed patient that if she does have systemic symptoms such as fevers or chills to come back to the clinic, if not we will reevaluate once course of antibiotics is done.  Plan: - Doxycycline 100 mg twice a day for 7 days  Essential hypertension Patient hypertensive at 184/72, however did not take her medications which include amlodipine 10 mg, hydralazine 100, Lasix 60 mg twice a day.  Per chart review, her last visit she did not take these as well, encourage patient to  start taking her medications before she comes to appointments.  Patient discussed with Dr. Dewain Penning Gerron Guidotti, M.D. [facility]  Internal Medicine, PGY-2 Pager: [phone] Date 10/30/2022 Time 7:03 AM

## 2022-10-30 NOTE — Assessment & Plan Note (Signed)
Patient states for the last week she has noticed a small bump on the right side of her temple that has slowly increased in size and has been draining yellow pus.  She denies any new laundry detergents, any bug bites, any new shampoos, or facial washes.  She denies any headaches, systemic symptoms such as fevers or chills.  She states that in is painful to palpation.  On exam, there is a 1 cm x 1 cm abscess located on the right temple region, and is significantly tender to palpation and attempts to squeeze pus out.  Please see photo on physical exam.  Overall, symptoms concerning for an abscess.  Will treat with doxycycline 100 mg twice a day for a week.  Informed patient that if she does have systemic symptoms such as fevers or chills to come back to the clinic, if not we will reevaluate once course of antibiotics is done.  Plan: - Doxycycline 100 mg twice a day for 7 days

## 2022-10-30 NOTE — Progress Notes (Signed)
Internal Medicine Clinic Attending  Case discussed with the resident at the time of the visit.  We reviewed the resident's history and exam and pertinent patient test results.  I agree with the assessment, diagnosis, and plan of care documented in the resident's note.  

## 2022-10-30 NOTE — Assessment & Plan Note (Signed)
Patient hypertensive at 184/72, however did not take her medications which include amlodipine 10 mg, hydralazine 100, Lasix 60 mg twice a day.  Per chart review, her last visit she did not take these as well, encourage patient to start taking her medications before she comes to appointments.

## 2022-11-03 ENCOUNTER — Other Ambulatory Visit: Payer: Self-pay | Admitting: Internal Medicine

## 2022-11-03 DIAGNOSIS — N184 Chronic kidney disease, stage 4 (severe): Secondary | ICD-10-CM

## 2022-11-04 DIAGNOSIS — I051 Rheumatic mitral insufficiency: Secondary | ICD-10-CM | POA: Diagnosis not present

## 2022-11-04 DIAGNOSIS — I700 Atherosclerosis of aorta: Secondary | ICD-10-CM | POA: Diagnosis not present

## 2022-11-04 DIAGNOSIS — K644 Residual hemorrhoidal skin tags: Secondary | ICD-10-CM | POA: Diagnosis not present

## 2022-11-04 DIAGNOSIS — N184 Chronic kidney disease, stage 4 (severe): Secondary | ICD-10-CM | POA: Diagnosis not present

## 2022-11-04 DIAGNOSIS — M170 Bilateral primary osteoarthritis of knee: Secondary | ICD-10-CM | POA: Diagnosis not present

## 2022-11-04 DIAGNOSIS — K219 Gastro-esophageal reflux disease without esophagitis: Secondary | ICD-10-CM | POA: Diagnosis not present

## 2022-11-04 DIAGNOSIS — E1122 Type 2 diabetes mellitus with diabetic chronic kidney disease: Secondary | ICD-10-CM | POA: Diagnosis not present

## 2022-11-04 DIAGNOSIS — I5033 Acute on chronic diastolic (congestive) heart failure: Secondary | ICD-10-CM | POA: Diagnosis not present

## 2022-11-04 DIAGNOSIS — J45909 Unspecified asthma, uncomplicated: Secondary | ICD-10-CM | POA: Diagnosis not present

## 2022-11-04 DIAGNOSIS — H538 Other visual disturbances: Secondary | ICD-10-CM | POA: Diagnosis not present

## 2022-11-04 DIAGNOSIS — I2510 Atherosclerotic heart disease of native coronary artery without angina pectoris: Secondary | ICD-10-CM | POA: Diagnosis not present

## 2022-11-04 DIAGNOSIS — D631 Anemia in chronic kidney disease: Secondary | ICD-10-CM | POA: Diagnosis not present

## 2022-11-04 DIAGNOSIS — I440 Atrioventricular block, first degree: Secondary | ICD-10-CM | POA: Diagnosis not present

## 2022-11-04 DIAGNOSIS — E785 Hyperlipidemia, unspecified: Secondary | ICD-10-CM | POA: Diagnosis not present

## 2022-11-04 DIAGNOSIS — I350 Nonrheumatic aortic (valve) stenosis: Secondary | ICD-10-CM | POA: Diagnosis not present

## 2022-11-04 DIAGNOSIS — N2581 Secondary hyperparathyroidism of renal origin: Secondary | ICD-10-CM | POA: Diagnosis not present

## 2022-11-04 DIAGNOSIS — N179 Acute kidney failure, unspecified: Secondary | ICD-10-CM | POA: Diagnosis not present

## 2022-11-04 DIAGNOSIS — M810 Age-related osteoporosis without current pathological fracture: Secondary | ICD-10-CM | POA: Diagnosis not present

## 2022-11-04 DIAGNOSIS — K5900 Constipation, unspecified: Secondary | ICD-10-CM | POA: Diagnosis not present

## 2022-11-04 DIAGNOSIS — D61818 Other pancytopenia: Secondary | ICD-10-CM | POA: Diagnosis not present

## 2022-11-04 DIAGNOSIS — I130 Hypertensive heart and chronic kidney disease with heart failure and stage 1 through stage 4 chronic kidney disease, or unspecified chronic kidney disease: Secondary | ICD-10-CM | POA: Diagnosis not present

## 2022-11-07 ENCOUNTER — Other Ambulatory Visit: Payer: Self-pay | Admitting: Internal Medicine

## 2022-11-07 DIAGNOSIS — N184 Chronic kidney disease, stage 4 (severe): Secondary | ICD-10-CM

## 2022-11-07 DIAGNOSIS — M47816 Spondylosis without myelopathy or radiculopathy, lumbar region: Secondary | ICD-10-CM

## 2022-11-07 MED ORDER — VITAMIN D 25 MCG (1000 UNIT) PO TABS: 1000.0000 [IU] | ORAL_TABLET | Freq: Every day | ORAL | 11 refills | Status: DC

## 2022-11-07 MED ORDER — SEVELAMER CARBONATE 800 MG PO TABS
800.0000 mg | ORAL_TABLET | Freq: Three times a day (TID) | ORAL | 11 refills | Status: AC
Start: 2022-11-07 — End: 2023-11-07

## 2022-11-07 MED ORDER — ACETAMINOPHEN 325 MG PO TABS
650.0000 mg | ORAL_TABLET | Freq: Four times a day (QID) | ORAL | 0 refills | Status: AC | PRN
Start: 2022-11-07 — End: ?

## 2022-11-11 ENCOUNTER — Ambulatory Visit (HOSPITAL_COMMUNITY): Payer: 59 | Attending: Cardiovascular Disease | Admitting: Cardiovascular Disease

## 2022-11-11 ENCOUNTER — Ambulatory Visit (HOSPITAL_COMMUNITY): Payer: 59

## 2022-11-11 ENCOUNTER — Encounter (HOSPITAL_COMMUNITY): Payer: 59

## 2022-11-11 ENCOUNTER — Encounter: Payer: Self-pay | Admitting: Cardiovascular Disease

## 2022-11-11 ENCOUNTER — Encounter (HOSPITAL_COMMUNITY): Payer: Self-pay

## 2022-11-11 ENCOUNTER — Other Ambulatory Visit (HOSPITAL_COMMUNITY): Payer: Self-pay | Admitting: *Deleted

## 2022-11-11 VITALS — BP 186/94 | HR 65 | Ht 61.0 in | Wt 170.0 lb

## 2022-11-11 DIAGNOSIS — I350 Nonrheumatic aortic (valve) stenosis: Secondary | ICD-10-CM

## 2022-11-11 DIAGNOSIS — I2510 Atherosclerotic heart disease of native coronary artery without angina pectoris: Secondary | ICD-10-CM

## 2022-11-11 DIAGNOSIS — I5031 Acute diastolic (congestive) heart failure: Secondary | ICD-10-CM | POA: Diagnosis not present

## 2022-11-11 DIAGNOSIS — N184 Chronic kidney disease, stage 4 (severe): Secondary | ICD-10-CM | POA: Diagnosis not present

## 2022-11-11 DIAGNOSIS — Z7189 Other specified counseling: Secondary | ICD-10-CM | POA: Diagnosis not present

## 2022-11-11 DIAGNOSIS — I5032 Chronic diastolic (congestive) heart failure: Secondary | ICD-10-CM

## 2022-11-11 DIAGNOSIS — M7989 Other specified soft tissue disorders: Secondary | ICD-10-CM | POA: Diagnosis not present

## 2022-11-11 DIAGNOSIS — I10 Essential (primary) hypertension: Secondary | ICD-10-CM

## 2022-11-11 NOTE — Patient Instructions (Addendum)
Medication Instructions:  Your physician recommends that you continue on your current medications as directed. Please refer to the Current Medication list given to you today.  *If you need a refill on your cardiac medications before your next appointment, please call your pharmacy*  Lab Work: Your physician recommends that you have lab work today BMET  If you have labs (blood work) drawn today and your tests are completely normal, you will receive your results only by: MyChart Message (if you have MyChart) OR A paper copy in the mail If you have any lab test that is abnormal or we need to change your treatment, we will call you to review the results.  Testing/Procedures: None ordered today.  Follow-Up: At [facility], you and your health needs are our priority.  As part of our continuing mission to provide you with exceptional heart care, we have created designated Provider Care Teams.  These Care Teams include your primary Cardiologist (physician) and Advanced Practice Providers (APPs -  Physician Assistants and Nurse Practitioners) who all work together to provide you with the care you need, when you need it.  We recommend signing up for the patient portal called "MyChart".  Sign up information is provided on this After Visit Summary.  MyChart is used to connect with patients for Virtual Visits (Telemedicine).  Patients are able to view lab/test results, encounter notes, upcoming appointments, etc.  Non-urgent messages can be sent to your provider as well.   To learn more about what you can do with MyChart, go to [email or web address].    Your next appointment:   4 months  Provider:   Tonny Bollman, MD     Other Instructions Your physician recommends that you schedule a follow-up appointment in: 6 weeks with [facility]

## 2022-11-11 NOTE — Progress Notes (Signed)
Cardiology Office Note:    Date:  11/11/2022   ID:  Laurey, Fasolino 25-Aug-1945, MRN [id number]  PCP:  Gwenevere Abbot, MD   [facility] Providers Cardiologist:  Tonny Bollman, MD     Referring MD: Gwenevere Abbot, MD   Chief Complaint  Patient presents with   Aortic Stenosis    History of Present Illness:    Sherry [name] is a 77 y.o. female presenting for follow-up of aortic stenosis, chronic diastolic heart failure, and coronary artery disease.  The patient has been managed conservatively in the context of her multiple medical problems and poor baseline functional capacity.  She has stage IV chronic kidney disease.  The patient also has underlying conduction disease and as we have discussed TAVR with her in the past, she would be at very high risk of heart block and need for permanent pacemaker.  She has been debilitated by severe arthritis and inability to stand for more than very short periods of time.  She has been essentially wheelchair-bound.  The patient is followed closely in the advanced heart failure clinic.  The patient is here with her husband today.  She has been doing well since her discharge from the hospital.  She has transition from low-dose furosemide over to torsemide.  I reviewed all of her hospital notes and discussions regarding a palliative approach to her care.  The patient states that her hand and arm edema have now resolved.  She continues to have some chronic leg edema but it is much better than it has been in the past.  She wears compression stockings and remains with her legs in a dependent position the majority of the time.  She tries to elevate her legs when she can.  She denies any chest pain, chest pressure, or shortness of breath.  She has no other complaints at this time  Past Medical History:  Diagnosis Date   AKI (acute kidney injury) (HCC) 01/31/2020   Anemia    Aortic stenosis    Arthritis    Bilateral lower extremity edema     Bilateral lower extremity edema    Blurry vision, bilateral 05/08/2016   Bradycardia    a. 01/2013 - asymptomatic.   Chronic diastolic heart failure (HCC)    Chronic sinus bradycardia 07/02/2012   CKD (chronic kidney disease)    a. baseline CKD stage III   Coronary artery disease    Epilepsy (HCC) 06/04/2006   EEG 02/19/12 " Interpretation:  This is an abnormal EEG demonstrating continuous and generalized slowing of the background in the theta frequency. Irregular rhythm of EKG noted.  Clinical correlation: the generalized slowing is consistent with a mild encephalopathy of nonspecific etiologies for which the differential would include infectious, toxic, metabolic, inflammatory, and vascular etiologies.    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 -Orbits: Grossly unremarkable. -Paranasal sinuses: Imaged portions clear. -Brain: No acute abnormalities such as hemorrhage, hydrocephalus, acute ischemia, or evidence of a mass lesion.  There is patchy  bilateral cerebral white matter T2 and flair signal hyper intensities.  Centimeter or smaller CSF intensity   Mobitz (type) I (Wenckebach's) atrioventricular block    a. 01/2013 - asymptomatic.   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   TIA (transient ischemic attack) 2014   pt stated she had "mini strokes"    Vitamin D deficiency 04/22/2014    Past Surgical History:  Procedure Laterality Date   ABDOMINAL HYSTERECTOMY     LEFT AND RIGHT HEART CATHETERIZATION WITH CORONARY ANGIOGRAM N/A 05/24/2013   Procedure: LEFT AND RIGHT HEART CATHETERIZATION WITH CORONARY ANGIOGRAM;  Surgeon: Lesleigh Noe, MD;  Location: [facility] CATH LAB;  Service: Cardiovascular;  Laterality: N/A;   LEFT HEART CATH N/A 02/01/2020   Procedure: Left Heart Cath;  Surgeon: Dolores Patty, MD;  Location: [facility] INVASIVE CV LAB;  Service: Cardiovascular;  Laterality: N/A;   RIGHT HEART CATH N/A 02/01/2020   Procedure: RIGHT HEART CATH;  Surgeon: Dolores Patty, MD;  Location: [facility] INVASIVE CV LAB;  Service: Cardiovascular;  Laterality: N/A;    Current Medications: Current Meds  Medication Sig   acetaminophen (TYLENOL) 325 MG tablet Take 2 tablets (650 mg total) by mouth every 6 (six) hours as needed for mild pain (or Fever >/= 101).   albuterol (PROVENTIL) (2.5 MG/3ML) 0.083% nebulizer solution Take 3 mLs (2.5 mg total) by nebulization every 6 (six) hours as needed for wheezing.   aspirin 81 MG chewable tablet Chew 1 tablet (81 mg total) by mouth daily.   calcium carbonate (OSCAL) 1500 (600 Ca) MG TABS tablet Take 1 tablet (1,500 mg total) by mouth 3 (three) times daily with meals.   cholecalciferol (VITAMIN D3) 25 MCG (1000 UNIT) tablet Take 1 tablet (1,000 Units total) by mouth daily.   doxycycline (VIBRAMYCIN) 100 MG capsule Take 1 capsule (100 mg total) by mouth 2 (two) times daily.   empagliflozin (JARDIANCE) 10 MG TABS tablet Take 1 tablet (10 mg total) by mouth daily.   febuxostat (ULORIC) 40 MG tablet Take 20 mg by mouth daily.   hydrALAZINE (APRESOLINE) 100 MG tablet Take 100 mg by mouth 3 (three) times daily.   Hydrocortisone (PREPARATION H EX) Apply 1 Application topically 2 (two) times daily as needed (Hemorrhoids).   lidocaine (LMX) 4 % cream Apply 1 Application topically 2 (two) times daily as needed (Pain on  sacral wound area).   multivitamin (RENA-VIT) TABS tablet Take 1 tablet by mouth at bedtime.   pantoprazole (PROTONIX) 40 MG tablet TAKE 1 TABLET BY MOUTH DAILY   PHENobarbital (LUMINAL) 64.8 MG tablet Take 2 tablets (129.6 mg total) by mouth at bedtime.   potassium chloride (KLOR-CON M) 10 MEQ tablet Take 1 tablet (10 mEq total) by mouth once daily.   rosuvastatin (CRESTOR) 40 MG tablet Take 1 tablet (40 mg total) by mouth daily.   sevelamer carbonate (RENVELA) 800 MG tablet Take 1 tablet (800 mg total) by mouth 3 (three) times daily with meals.   torsemide (DEMADEX) 20 MG tablet TAKE 2 TABLETS BY MOUTH DAILY AND AN EXTRA DOSE(2 TABLETS) IN THE PM FOR SWELLING OR SHORTNESS OF BREATH   zonisamide (ZONEGRAN) 100 MG capsule TAKE 2 CAPSULES BY MOUTH AT NIGHT     Allergies:   Ibuprofen and Lactose intolerance (gi)   Social History   Socioeconomic  History   Marital status: Married    Spouse name: Johnny   Number of children: 2   Years of education: 4   Highest education level: Not on file  Occupational History   Occupation: retired  Tobacco Use   Smoking status: Former    Current packs/day: 0.00    Average packs/day: 0.5 packs/day for 10.0 years (5.0 ttl pk-yrs)    Types: Cigarettes    Start date: 06/04/1990    Quit date: 06/03/2000    Years since quitting: 22.4   Smokeless tobacco: Never  Vaping Use   Vaping status: Never Used  Substance and Sexual Activity   Alcohol use: No    Alcohol/week: 0.0 standard drinks of alcohol   Drug use: No   Sexual activity: Not Currently    Partners: Male  Other Topics Concern   Not on file  Social History Narrative   Current Social History 06/20/2020        Patient lives with spouse in a home which is 1 story. There are not steps up to the entrance, the patient uses a ramp.       Patient's method of transportation is [employer].      The highest level of education was some high school.      The patient currently disabled.      Identified  important Relationships are "God and my husband"       Pets : None       Interests / Fun: "Sitting on my front porch"       Current Stressors: None        Son passed away.      Social Determinants of Health   Financial Resource Strain: Low Risk  (09/18/2022)   Overall Financial Resource Strain (CARDIA)    Difficulty of Paying Living Expenses: Not hard at all  Food Insecurity: No Food Insecurity (09/18/2022)   Hunger Vital Sign    Worried About Running Out of Food in the Last Year: Never true    Ran Out of Food in the Last Year: Never true  Transportation Needs: No Transportation Needs (09/18/2022)   PRAPARE - Administrator, Civil Service (Medical): No    Lack of Transportation (Non-Medical): No  Physical Activity: Inactive (09/18/2022)   Exercise Vital Sign    Days of Exercise per Week: 0 days    Minutes of Exercise per Session: 0 min  Stress: No Stress Concern Present (09/18/2022)   [employer] of Occupational Health - Occupational Stress Questionnaire    Feeling of Stress : Not at all  Social Connections: Moderately Isolated (09/18/2022)   Social Connection and Isolation Panel [NHANES]    Frequency of Communication with Friends and Family: More than three times a week    Frequency of Social Gatherings with Friends and Family: Not on file    Attends Religious Services: Never    Database administrator or Organizations: No    Attends Engineer, structural: Never    Marital Status: Married     Family History: The patient's family history includes Asthma in her sister; Cancer in her father; Heart disease in her mother, sister, and sister; Hypertension in her mother and another family member; Obesity in her son.  ROS:   Please see the history of present illness.    All other systems reviewed and are negative.  EKGs/Labs/Other Studies Reviewed:    The following studies were reviewed today: Echocardiogram 08/31/2022:  1. Left ventricular ejection  fraction, by estimation, is 60 to 65%. The  left ventricle has normal function. The left ventricle has no regional  wall motion abnormalities. There is severe concentric left ventricular  hypertrophy. Indeterminate diastolic  filling due to E-A fusion.   2. Right ventricular systolic function is normal. The right ventricular  size is normal. Tricuspid regurgitation signal is inadequate for assessing  PA pressure.   3. Left atrial size was moderately dilated.   4. A small to moderate pericardial effusion is present. The pericardial  effusion is circumferential. Large pleural effusion in the left lateral  region.   5. The mitral valve is abnormal. Trivial mitral valve regurgitation. No  evidence of mitral stenosis. Severe mitral annular calcification.   6. The aortic valve is tricuspid. There is severe calcifcation of the  aortic valve. There is severe thickening of the aortic valve. Aortic valve  regurgitation is mild. Severe aortic valve stenosis. Aortic valve area, by  VTI measures 0.62 cm. Aortic  valve mean gradient measures 50.0 mmHg. Aortic valve Vmax measures 4.56  m/s.   7. The inferior vena cava is dilated in size with >50% respiratory  variability, suggesting right atrial pressure of 8 mmHg.   Comparison(s): Changes from prior study are noted. Severe AS now. Speckled  appearance of myocardium and severe AS suggestive of Amyloid but prior PYP  scans are negative.        Recent Labs: 08/30/2022: B Natriuretic Peptide 2,133.4; TSH 6.844 08/31/2022: ALT 30 09/10/2022: Hemoglobin 7.3; Platelets 260 09/17/2022: BUN 74; Creatinine, Ser 2.65; Magnesium 2.2; Potassium 3.5; Sodium 149  Recent Lipid Panel    Component Value Date/Time   CHOL 170 02/14/2022 1041   CHOL 179 01/26/2021 0735   TRIG 72 02/14/2022 1041   HDL 64 02/14/2022 1041   HDL 64 01/26/2021 0735   CHOLHDL 2.7 02/14/2022 1041   VLDL 14 02/14/2022 1041   LDLCALC 92 02/14/2022 1041   LDLCALC 100 (H) 01/26/2021  0735     Risk Assessment/Calculations:          Physical Exam:    VS:  BP (!) 186/94   Pulse 65   Ht 5\' 1"  (1.549 m)   Wt 170 lb (77.1 kg)   SpO2 99%   BMI 32.12 kg/m     Wt Readings from Last 3 Encounters:  11/11/22 170 lb (77.1 kg)  09/18/22 171 lb (77.6 kg)  09/10/22 171 lb 1.2 oz (77.6 kg)     GEN: Pleasant but chronically ill-appearing woman, in a wheelchair, in no acute distress HEENT: Normal NECK: No JVD; No carotid bruits LYMPHATICS: No lymphadenopathy CARDIAC: RRR, 4/6 harsh crescendo decrescendo murmur at the right upper sternal border, absent A2 RESPIRATORY:  Clear to auscultation without rales, wheezing or rhonchi  ABDOMEN: Soft, non-tender, non-distended MUSCULOSKELETAL: 2+ bilateral pretibial and ankle edema; No deformity  SKIN: Warm and dry NEUROLOGIC:  Alert and oriented x 3 PSYCHIATRIC:  Normal affect   ASSESSMENT:    1. Chronic diastolic congestive heart failure (HCC)   2. Nonrheumatic aortic valve stenosis   3. Coronary artery disease involving native coronary artery of native heart without angina pectoris   4. CKD (chronic kidney disease) stage 4, GFR 15-29 ml/min (HCC)   5. Goals of care, counseling/discussion   6. Primary hypertension    PLAN:    In order of problems listed above:  The patient appears clinically stable on her current medical program.  She is treated with Jardiance, torsemide, and hydralazine.  She is not  a candidate for ACE/ARB/ARNI because of advanced kidney disease.  She is not a candidate for beta-blockers because of conduction disease and bradycardia with history of AV block.  Will continue her current medical management with a palliative approach to her care in the setting of her severe valvular heart disease and other comorbidities. Patient with severe aortic stenosis.  I reviewed her most recent echo from August 31, 2022.  This demonstrates severe concentric LVH, mean transvalvular gradient 50 mmHg, and calculated aortic  valve area of 0.6 cm.  Due to her very poor functional capacity with essentially nonambulatory status, advanced kidney disease, history of AV block, and other comorbidities, she is not felt to be a candidate for TAVR.  Will continue with her current medical management.  She would do very poorly with dialysis and a palliative approach seems most appropriate.  We have had extensive discussion with the patient about this in the past. No anginal symptoms.  Continue aspirin. Check metabolic panel today since she has been on torsemide since hospital discharge. Chart reviewed, patient has been seen by palliative care.  She has not been ready for hospice but understands that she will be treated conservatively with medical therapy. Blood pressure significantly elevated but reports home readings in the 130s over 70 mmHg range.  Continue current management.           Medication Adjustments/Labs and Tests Ordered: Current medicines are reviewed at length with the patient today.  Concerns regarding medicines are outlined above.  Orders Placed This Encounter  Procedures   Basic Metabolic Panel (BMET)   No orders of the defined types were placed in this encounter.   Patient Instructions  Medication Instructions:  Your physician recommends that you continue on your current medications as directed. Please refer to the Current Medication list given to you today.  *If you need a refill on your cardiac medications before your next appointment, please call your pharmacy*  Lab Work: Your physician recommends that you have lab work today BMET  If you have labs (blood work) drawn today and your tests are completely normal, you will receive your results only by: MyChart Message (if you have MyChart) OR A paper copy in the mail If you have any lab test that is abnormal or we need to change your treatment, we will call you to review the results.  Testing/Procedures: None ordered today.  Follow-Up: At  [facility], you and your health needs are our priority.  As part of our continuing mission to provide you with exceptional heart care, we have created designated Provider Care Teams.  These Care Teams include your primary Cardiologist (physician) and Advanced Practice Providers (APPs -  Physician Assistants and Nurse Practitioners) who all work together to provide you with the care you need, when you need it.  We recommend signing up for the patient portal called "MyChart".  Sign up information is provided on this After Visit Summary.  MyChart is used to connect with patients for Virtual Visits (Telemedicine).  Patients are able to view lab/test results, encounter notes, upcoming appointments, etc.  Non-urgent messages can be sent to your provider as well.   To learn more about what you can do with MyChart, go to [email or web address].    Your next appointment:   4 months  Provider:   Tonny Bollman, MD     Other Instructions Your physician recommends that you schedule a follow-up appointment in: 6 weeks with Heart Failure Clinic    Signed, Tonny Bollman,  MD  11/11/2022 5:16 PM    [facility]

## 2022-11-12 LAB — BASIC METABOLIC PANEL
BUN/Creatinine Ratio: 36 — ABNORMAL HIGH (ref 12–28)
BUN: 125 mg/dL (ref 8–27)
CO2: 21 mmol/L (ref 20–29)
Calcium: 8.4 mg/dL — ABNORMAL LOW (ref 8.7–10.3)
Chloride: 93 mmol/L — ABNORMAL LOW (ref 96–106)
Creatinine, Ser: 3.49 mg/dL — ABNORMAL HIGH (ref 0.57–1.00)
Glucose: 88 mg/dL (ref 70–99)
Potassium: 3.7 mmol/L (ref 3.5–5.2)
Sodium: 140 mmol/L (ref 134–144)
eGFR: 13 mL/min/{1.73_m2} — ABNORMAL LOW (ref >59)

## 2022-11-13 ENCOUNTER — Ambulatory Visit (HOSPITAL_COMMUNITY)
Admission: RE | Admit: 2022-11-13 | Discharge: 2022-11-13 | Disposition: A | Discharge status: Home / Self Care | Payer: 59 | Source: Ambulatory Visit | Attending: Nephrology | Admitting: Nephrology

## 2022-11-13 DIAGNOSIS — N189 Chronic kidney disease, unspecified: Secondary | ICD-10-CM | POA: Diagnosis not present

## 2022-11-13 DIAGNOSIS — D631 Anemia in chronic kidney disease: Secondary | ICD-10-CM | POA: Insufficient documentation

## 2022-11-13 MED ORDER — SODIUM CHLORIDE 0.9 % IV SOLN
510.0000 mg | INTRAVENOUS | Status: DC
  Administered 2022-11-13: 510 mg via INTRAVENOUS
  Filled 2022-11-13: qty 510

## 2022-11-17 ENCOUNTER — Other Ambulatory Visit: Payer: Self-pay | Admitting: Internal Medicine

## 2022-11-19 ENCOUNTER — Other Ambulatory Visit: Payer: Self-pay | Admitting: Internal Medicine

## 2022-11-19 DIAGNOSIS — K219 Gastro-esophageal reflux disease without esophagitis: Secondary | ICD-10-CM

## 2022-11-20 ENCOUNTER — Encounter (HOSPITAL_COMMUNITY)
Admission: RE | Admit: 2022-11-20 | Discharge: 2022-11-20 | Disposition: A | Discharge status: Home / Self Care | Payer: 59 | Source: Ambulatory Visit | Attending: Nephrology | Admitting: Nephrology

## 2022-11-20 DIAGNOSIS — N184 Chronic kidney disease, stage 4 (severe): Secondary | ICD-10-CM | POA: Insufficient documentation

## 2022-11-20 DIAGNOSIS — D631 Anemia in chronic kidney disease: Secondary | ICD-10-CM | POA: Insufficient documentation

## 2022-11-20 MED ORDER — SODIUM CHLORIDE 0.9 % IV SOLN
510.0000 mg | INTRAVENOUS | Status: DC
  Administered 2022-11-20: 510 mg via INTRAVENOUS
  Filled 2022-11-20: qty 510

## 2022-11-21 ENCOUNTER — Encounter (HOSPITAL_COMMUNITY): Payer: Self-pay

## 2022-11-21 ENCOUNTER — Ambulatory Visit (HOSPITAL_COMMUNITY)
Admission: RE | Admit: 2022-11-21 | Discharge: 2022-11-21 | Disposition: A | Discharge status: Home / Self Care | Payer: 59 | Source: Ambulatory Visit | Attending: Cardiology | Admitting: Cardiology

## 2022-11-21 ENCOUNTER — Other Ambulatory Visit: Payer: Self-pay

## 2022-11-21 VITALS — BP 152/68 | HR 75

## 2022-11-21 DIAGNOSIS — I5032 Chronic diastolic (congestive) heart failure: Secondary | ICD-10-CM | POA: Insufficient documentation

## 2022-11-21 DIAGNOSIS — I440 Atrioventricular block, first degree: Secondary | ICD-10-CM | POA: Insufficient documentation

## 2022-11-21 DIAGNOSIS — Z7409 Other reduced mobility: Secondary | ICD-10-CM | POA: Insufficient documentation

## 2022-11-21 DIAGNOSIS — I2510 Atherosclerotic heart disease of native coronary artery without angina pectoris: Secondary | ICD-10-CM | POA: Insufficient documentation

## 2022-11-21 DIAGNOSIS — R5381 Other malaise: Secondary | ICD-10-CM | POA: Diagnosis not present

## 2022-11-21 DIAGNOSIS — I5022 Chronic systolic (congestive) heart failure: Secondary | ICD-10-CM

## 2022-11-21 DIAGNOSIS — M47816 Spondylosis without myelopathy or radiculopathy, lumbar region: Secondary | ICD-10-CM

## 2022-11-21 DIAGNOSIS — I130 Hypertensive heart and chronic kidney disease with heart failure and stage 1 through stage 4 chronic kidney disease, or unspecified chronic kidney disease: Secondary | ICD-10-CM | POA: Insufficient documentation

## 2022-11-21 DIAGNOSIS — N184 Chronic kidney disease, stage 4 (severe): Secondary | ICD-10-CM

## 2022-11-21 DIAGNOSIS — I350 Nonrheumatic aortic (valve) stenosis: Secondary | ICD-10-CM | POA: Diagnosis not present

## 2022-11-21 LAB — BASIC METABOLIC PANEL
Anion gap: 20 — ABNORMAL HIGH (ref 5–15)
BUN: 150 mg/dL — ABNORMAL HIGH (ref 8–23)
CO2: 22 mmol/L (ref 22–32)
Calcium: 8.4 mg/dL — ABNORMAL LOW (ref 8.9–10.3)
Chloride: 99 mmol/L (ref 98–111)
Creatinine, Ser: 2.94 mg/dL — ABNORMAL HIGH (ref 0.44–1.00)
GFR, Estimated: 16 mL/min — ABNORMAL LOW (ref >60)
Glucose, Bld: 96 mg/dL (ref 70–99)
Potassium: 3.7 mmol/L (ref 3.5–5.1)
Sodium: 141 mmol/L (ref 135–145)

## 2022-11-21 MED ORDER — SEVELAMER CARBONATE 800 MG PO TABS
800.0000 mg | ORAL_TABLET | Freq: Three times a day (TID) | ORAL | 11 refills | Status: DC
Start: 2022-11-21 — End: 2023-04-14

## 2022-11-21 MED ORDER — VITAMIN D 25 MCG (1000 UNIT) PO TABS: 1000.0000 [IU] | ORAL_TABLET | Freq: Every day | ORAL | 11 refills | Status: DC

## 2022-11-21 MED ORDER — ACETAMINOPHEN 325 MG PO TABS
650.0000 mg | ORAL_TABLET | Freq: Four times a day (QID) | ORAL | 0 refills | Status: DC | PRN
Start: 2022-11-21 — End: 2023-10-05

## 2022-11-21 NOTE — Progress Notes (Signed)
[facility] Progress Note    PCP: Gwenevere Abbot, MD Cardiology: Dr. Excell Seltzer HF Cardiology: Dr. Shirlee Latch  77 y.o. with history of aortic stenosis, chronic diastolic CHF, and CAD was referred by Dr. Excell Seltzer for evaluation of CHF.  Based on 12/21 echo and RHC/LHC in 12/21, the patient has moderate aortic stenosis. She had coronary angiography in 4/15 with rather diffuse moderate-severe coronary disease that was managed medically.  Cardiac MRI in 11/19 was concerning for cardiac amyloidosis. However, PYP scan was negative in 11/19.  Workup at that time also did not show a monoclonal protein to suggest AL amyloidosis.    Echo in 11/22 showed EF 60-65%, severe LVH, normal RV size and systolic function, mild MR, paradoxical low flow/low gradient severe aortic stenosis with mean gradient 24 mmHg and AVA 0.88 cm^2, small to moderate pericardial effusion.  PYP scan was repeated in 11/22, grade 1 with H/CL 1.07.  This is unlikely to be TTR cardiac amyloidosis.  Repeat urine immunofixation and myeloma panel were negative as well.  [employer] gene testing negative for hATTR mutation.   She was evaluated by hematology for pancytopenia.  This improved without intervention.   Follow up 07/26/21, BP markedly elevated and given 50 mg hydralazine PO in clinic and hydralazine increased to 50 mg tid.    Echo in 9/23 showed EF 60-65%, severe concentric LVH, normal RV, PASP 43, moderate pericardial effusion without tamponade, mild-moderate MR, moderate mitral stenosis with mean gradient 7 mmHg, severe low flow/low gradient AS mean gradient 30 with AVA 0.95 cm^2, IVC normal.   Follow up 10/23, NYHA II-III and mildly volume overloaded, Lasix increased to 60 daily.   Admitted 7/24 with a/c HF. Echo showed AS severe. Structural Team consulted and felt not TAVR candidate. Diuresed with IV lasix and GDMT titrated, limited by kidney function. She was seen by PMT and code updated to DNR, with plans for palliative care  outpatient.  She was discharged home, weight 171 lbs.  Last OV in the [facility], her BP was elevated and hydralazine was increased to 100 mg tid. Volume status was ok. Was continued on Jardiance and torsemide regimen of 40 mg daily.   Of note, she had recent BMP on 11/11/22 that showed Scr 3.49 c/w b/l but BUN was 125.   She presents today for f/u. Here w/ her husband. In WC. Primarily WC dependent but still tries to do things around the house, laundry and wash dishes. SOB w/ theses activities and with transfers. Reports full med compliance. No CP.  ReDs elevated 39%.   ECG: not performed   Labs (6/23): LDL 119 Labs (9/23): K 4.1, creatinine 2.11 Labs (11/23): K 3.3, creatinine 2.29 Labs (7/24): K 3.5, creatinine 2.65 Labs (9/24): K 3.7, creatinine 3.49   PMH: 1. CKD stage 3 2. H/o seizure disorder 3. H/o TIA 4. H/o Mobitz type 1 2nd degree AVB 5. HTN 6. CAD: LHC (4/15) with 80% and 50% tandem stenoses in the proximal LAD, 80% mid LCx, 85% dLCx, diffuse mid-distal disease up to 90% in RCA.  Diffuse coronary disease, treated medically.  7. Aortic stenosis: Suspect moderate AS. - Echo (12/21): AoV mean gradient 32, AVA 1.2 cm^2.  - RHC/LHC (12/21): AoV mean gradient 22, AVA 1.47 cm^2.  - Echo (11/22): EF 60-65%, severe LVH, normal RV size and systolic function, mild MR, paradoxical low flow/low gradient severe aortic stenosis with mean gradient 24 mmHg and AVA 0.88 cm^2, small to moderate pericardial effusion.  - Echo (7/24): severe  AS, mean gradient worsened to 50 mmHg. 8. Chronic diastolic CHF:  - Cardiac MRI (65/78): EF 76%, moderate LVH, difficult to null LV myocardium with diffuse subendocardial LGE concerning for possible cardiac amyloidosis, moderate pericardial effusion.  - PYP scan (11/19): grade 0, H/CL 1.08.  - Echo (12/21): EF 60-65%, moderate LVH, normal RV, moderate pericardial effusion, moderate AS with AoV mean gradient 32, AVA 1.2 cm^2.  - Echo (11/22): EF 60-65%, severe LVH,  normal RV size and systolic function, mild MR, paradoxical low flow/low gradient severe aortic stenosis with mean gradient 24 mmHg and AVA 0.88 cm^2, small to moderate pericardial effusion.   - PYP scan (11/22): grade 1 with H/CL 1.07.  This is unlikely to be TTR cardiac amyloidosis. - [employer] gene testing negative for hATTR mutation.  - Echo (9/23): EF 60-65%, severe concentric LVH, normal RV, PASP 43, moderate pericardial effusion without tamponade, mild-moderate MR, moderate mitral stenosis with mean gradient 7 mmHg, severe low flow/low gradient AS mean gradient 30 with AVA 0.95 cm^2, IVC normal.  - Echo (7/24): EF 60-65%, severe LVH, normal RV, severe MAC, severe AS AVA 0.62 cm, AoV mean gradient 50 mmHg  9. Arthritis: Severe, limited to wheelchair.  10. Hyperlipidemia  Social History   Socioeconomic History   Marital status: Married    Spouse name: Johnny   Number of children: 2   Years of education: 4   Highest education level: Not on file  Occupational History   Occupation: retired  Tobacco Use   Smoking status: Former    Current packs/day: 0.00    Average packs/day: 0.5 packs/day for 10.0 years (5.0 ttl pk-yrs)    Types: Cigarettes    Start date: 06/04/1990    Quit date: 06/03/2000    Years since quitting: 22.4   Smokeless tobacco: Never  Vaping Use   Vaping status: Never Used  Substance and Sexual Activity   Alcohol use: No    Alcohol/week: 0.0 standard drinks of alcohol   Drug use: No   Sexual activity: Not Currently    Partners: Male  Other Topics Concern   Not on file  Social History Narrative   Current Social History 06/20/2020        Patient lives with spouse in a home which is 1 story. There are not steps up to the entrance, the patient uses a ramp.       Patient's method of transportation is [employer].      The highest level of education was some high school.      The patient currently disabled.      Identified important Relationships are "God and my husband"        Pets : None       Interests / Fun: "Sitting on my front porch"       Current Stressors: None        Son passed away.      Social Determinants of Health   Financial Resource Strain: Low Risk  (09/18/2022)   Overall Financial Resource Strain (CARDIA)    Difficulty of Paying Living Expenses: Not hard at all  Food Insecurity: No Food Insecurity (09/18/2022)   Hunger Vital Sign    Worried About Running Out of Food in the Last Year: Never true    Ran Out of Food in the Last Year: Never true  Transportation Needs: No Transportation Needs (09/18/2022)   PRAPARE - Administrator, Civil Service (Medical): No    Lack of Transportation (Non-Medical): No  Physical Activity: Inactive (09/18/2022)   Exercise Vital Sign    Days of Exercise per Week: 0 days    Minutes of Exercise per Session: 0 min  Stress: No Stress Concern Present (09/18/2022)   [employer] of Occupational Health - Occupational Stress Questionnaire    Feeling of Stress : Not at all  Social Connections: Moderately Isolated (09/18/2022)   Social Connection and Isolation Panel [NHANES]    Frequency of Communication with Friends and Family: More than three times a week    Frequency of Social Gatherings with Friends and Family: Not on file    Attends Religious Services: Never    Database administrator or Organizations: No    Attends Banker Meetings: Never    Marital Status: Married  Catering manager Violence: Not At Risk (09/18/2022)   Humiliation, Afraid, Rape, and Kick questionnaire    Fear of Current or Ex-Partner: No    Emotionally Abused: No    Physically Abused: No    Sexually Abused: No   Family History  Problem Relation Age of Onset   Hypertension Other    Heart disease Mother    Hypertension Mother    Cancer Father    Obesity Son    Asthma Sister    Heart disease Sister    Heart disease Sister    ROS: All systems reviewed and negative except as per HPI.   Current Outpatient  Medications  Medication Sig Dispense Refill   acetaminophen (TYLENOL) 325 MG tablet Take 2 tablets (650 mg total) by mouth every 6 (six) hours as needed for mild pain (or Fever >/= 101). 100 tablet 0   albuterol (PROVENTIL) (2.5 MG/3ML) 0.083% nebulizer solution Take 3 mLs (2.5 mg total) by nebulization every 6 (six) hours as needed for wheezing. 90 mL 12   aspirin 81 MG chewable tablet Chew 1 tablet (81 mg total) by mouth daily. 90 tablet 0   calcium carbonate (OSCAL) 1500 (600 Ca) MG TABS tablet Take 1 tablet (1,500 mg total) by mouth 3 (three) times daily with meals. 90 tablet 0   cholecalciferol (VITAMIN D3) 25 MCG (1000 UNIT) tablet Take 1 tablet (1,000 Units total) by mouth daily. 30 tablet 11   doxycycline (VIBRAMYCIN) 100 MG capsule Take 1 capsule (100 mg total) by mouth 2 (two) times daily. 14 capsule 0   empagliflozin (JARDIANCE) 10 MG TABS tablet Take 1 tablet (10 mg total) by mouth daily. 30 tablet 0   febuxostat (ULORIC) 40 MG tablet Take 20 mg by mouth daily.     hydrALAZINE (APRESOLINE) 100 MG tablet Take 100 mg by mouth 3 (three) times daily.     Hydrocortisone (PREPARATION H EX) Apply 1 Application topically 2 (two) times daily as needed (Hemorrhoids).     lidocaine (LMX) 4 % cream Apply 1 Application topically 2 (two) times daily as needed (Pain on sacral wound area). 30 g 0   multivitamin (RENA-VIT) TABS tablet Take 1 tablet by mouth at bedtime. 100 tablet 0   pantoprazole (PROTONIX) 40 MG tablet TAKE 1 TABLET BY MOUTH DAILY 100 tablet 2   PHENobarbital (LUMINAL) 64.8 MG tablet Take 2 tablets (129.6 mg total) by mouth at bedtime. 200 tablet 3   potassium chloride (KLOR-CON M) 10 MEQ tablet Take 1 tablet (10 mEq total) by mouth once daily. 30 tablet 0   rosuvastatin (CRESTOR) 40 MG tablet Take 1 tablet (40 mg total) by mouth daily. 90 tablet 3   sevelamer carbonate (RENVELA) 800 MG tablet  Take 1 tablet (800 mg total) by mouth 3 (three) times daily with meals. 90 tablet 11    torsemide (DEMADEX) 20 MG tablet TAKE 2 TABLETS BY MOUTH DAILY AND AN EXTRA DOSE(2 TABLETS) IN THE PM FOR SWELLING OR SHORTNESS OF BREATH 180 tablet 3   zonisamide (ZONEGRAN) 100 MG capsule TAKE 2 CAPSULES BY MOUTH AT NIGHT 200 capsule 3   No current facility-administered medications for this encounter.   Wt Readings from Last 3 Encounters:  11/11/22 77.1 kg (170 lb)  09/18/22 77.6 kg (171 lb)  09/10/22 77.6 kg (171 lb 1.2 oz)   BP (!) 152/68   Pulse 75  PHYSICAL EXAM: ReDs 39%  General:  chronically ill appearing, in motorized WC. No respiratory difficulty HEENT: normal Neck: supple. JVD 8 cm. Carotids 2+ bilat; no bruits. No lymphadenopathy or thyromegaly appreciated. Cor: PMI nondisplaced. Regular rate & rhythm. 3/6 SEM loudest RUSB  Lungs: decreased BS at the bases bilaterally clear Abdomen: soft, nontender, nondistended. No hepatosplenomegaly. No bruits or masses. Good bowel sounds. Extremities: no cyanosis, clubbing, rash, trace b/l LE edema Neuro: alert & oriented x 3, cranial nerves grossly intact. moves all 4 extremities w/o difficulty. Affect pleasant.   Assessment/Plan 1. Aortic stenosis: 12/21 LHC/RHC and echo were suggestive of moderate aortic stenosis.  Repeat echo in 11/22 showed paradoxical low flow/low gradient severe aortic stenosis, echo in 9/23 was similar.  Echo this admission (7/24) with severe AS, gradient higher.  She has been seen by Dr. Excell Seltzer in the past for TAVR evaluation.  She is very limited in terms of activity, basically wheel-chair bound at baseline. She has a long 1st degree AVB and has episodes of type 1 2nd degree AVB.  She also has CKD stage IV.  TAVR could be done, but high risk for progression to ESRD and need for pacemaker.  She would have a hard time with HD due to lack of mobility.  Evaluated by structural hear team, determined not a TAVR candidate.  - Dr. Excell Seltzer still following. Plan return f/u in 4 months  2. CAD: Moderate-severe diffuse CAD on  coronary angiography in 4/15. She would not be a candidate for CABG due to lack of mobility. Stable w/o CP  - Continue ASA 81 - Continue Crestor. 3. CKD stage IV: Baseline creatinine around 2.5+, likely new baseline 3.4. Followed by Dr. Signe Colt  - check BMP today  4. Chronic diastolic CHF: Cardiac MRI was concerning for cardiac amyloidosis (11/19), as was echo with moderate LVH and aortic stenosis. The aortic stenosis and the pericardial effusion seen by echo are also frequently seen with cardiac amyloidosis. She additionally has tingling/numbness in hands/feet that is consistent with peripheral neuropathy.  However, PYP scan in 11/19 was negative and myeloma workup was negative.  Repeat PYP scan in 11/22 was again negative and she did not have a transthyretin gene mutation on [employer] gene testing.  Myeloma workup was again negative in 10/22.  Therefore, cardiac amyloidosis seems unlikely and LVH may be due to HTN and aortic stenosis though delayed enhancement from MRI is more suggestive of infiltrative disease. Echo this admission shows EF 60-65%, severe LVH, normal RV, small to moderate pericardial effusion (unchanged), severe AS with mean gradient 50 mmHg (AS has been severe in past but mean gradient has progressed). NYHA IIb-III, functional status difficult due to being WC bound. She does not appear markedly volume overloaded today but her ReDs is elevated at 39%. - Increase torsemide to 60 mg daily x 2 days  then back to 40 mg daily (can take extra 40 mg PRN)   - Continue Jardiance.  - No ARB/ARNi or spiro with CKD - BMP/BNP today  5. HTN: mildly elevated but w/ CKD IV, will avoid lowering BP too much  - continue current med regimen  6. Conduction abnormality: Long 1st degree AVB, also runs of type 1 2nd degree AVB have been noted.   - Avoid beta blockade.  7. Debility: 2/2 severe arthritis. Minimally mobile. This is her baseline.  8. GOC: Pt with worsening severe AS, conduction delay, diastolic  CHF, known CAD medically managed and likely progressed, worsening CKD now stage IV/V, and minimal mobility. High risk of continued volume overload and ESRD without TAVR and high risk of ESRD and post TAVR heart block needing pacemaker with TAVR. I do not think she would do well on dialysis due to limited mobility.  - She is not reading for hospice, but she understands palliative management of her symptoms and preserving QOL is main goal.  F/u in 4-6 wks w/ APP.   Robbie Lis, PA-C 11/21/22

## 2022-11-21 NOTE — Patient Instructions (Signed)
Medication Changes:  INCREASE TORSEMIDE TO 60MG  FOR 2 DAYS ONLY THEN RETURN TO 40MG  DAILY THEREAFTER   Lab Work:  Labs done today, your results will be available in MyChart, we will contact you for abnormal readings.  Follow-Up in: 6 WEEKS AS SCHEDULED WITH APP   At the [facility], you and your health needs are our priority. We have a designated team specialized in the treatment of Heart Failure. This Care Team includes your primary Heart Failure Specialized Cardiologist (physician), Advanced Practice Providers (APPs- Physician Assistants and Nurse Practitioners), and Pharmacist who all work together to provide you with the care you need, when you need it.   You may see any of the following providers on your designated Care Team at your next follow up:  Dr. Arvilla Meres Dr. Marca Ancona Dr. Dorthula Nettles Dr. Theresia Bough Tonye Becket, NP Robbie Lis, [place] [facility] [place], [place] Brynda Peon, NP [place] Lee, NP Karle Plumber, PharmD   Please be sure to bring in all your medications bottles to every appointment.   Need to Contact us:  If you have any questions or concerns before your next appointment please send [place] a message through [place] or call our office at [phone].    TO LEAVE A MESSAGE FOR THE NURSE SELECT OPTION 2, PLEASE LEAVE A MESSAGE INCLUDING: YOUR NAME DATE OF BIRTH CALL BACK NUMBER REASON FOR CALL**this is important as we prioritize the call backs  YOU WILL RECEIVE A CALL BACK THE SAME DAY AS LONG AS YOU CALL BEFORE 4:00 PM

## 2022-11-21 NOTE — Progress Notes (Signed)
ReDS Vest / Clip - 11/21/22 1200       ReDS Vest / Clip   Station Marker A    Ruler Value 29.5    ReDS Value Range Moderate volume overload    ReDS Actual Value 39

## 2022-11-21 NOTE — Telephone Encounter (Addendum)
Patients daughter called she is requesting her moms medications to be sent to [facility] on [address] instead of [facility] delivery. Patients daughter stated she is completely out of sevelemar.

## 2022-12-11 DIAGNOSIS — M7989 Other specified soft tissue disorders: Secondary | ICD-10-CM | POA: Diagnosis not present

## 2022-12-11 DIAGNOSIS — I5031 Acute diastolic (congestive) heart failure: Secondary | ICD-10-CM | POA: Diagnosis not present

## 2022-12-13 ENCOUNTER — Other Ambulatory Visit (HOSPITAL_COMMUNITY): Payer: Self-pay

## 2022-12-26 ENCOUNTER — Other Ambulatory Visit: Payer: Self-pay | Admitting: Student

## 2023-01-01 ENCOUNTER — Encounter (HOSPITAL_COMMUNITY): Payer: 59

## 2023-01-02 ENCOUNTER — Encounter (HOSPITAL_COMMUNITY): Payer: 59

## 2023-01-11 DIAGNOSIS — I5031 Acute diastolic (congestive) heart failure: Secondary | ICD-10-CM | POA: Diagnosis not present

## 2023-01-11 DIAGNOSIS — M7989 Other specified soft tissue disorders: Secondary | ICD-10-CM | POA: Diagnosis not present

## 2023-01-30 ENCOUNTER — Other Ambulatory Visit: Payer: Self-pay | Admitting: Internal Medicine

## 2023-02-03 ENCOUNTER — Ambulatory Visit (HOSPITAL_COMMUNITY)
Admission: RE | Admit: 2023-02-03 | Discharge: 2023-02-03 | Disposition: A | Discharge status: Home / Self Care | Payer: 59 | Source: Ambulatory Visit | Attending: Family Medicine

## 2023-02-03 ENCOUNTER — Encounter (HOSPITAL_COMMUNITY): Payer: Self-pay

## 2023-02-03 VITALS — BP 172/74 | HR 64

## 2023-02-03 DIAGNOSIS — M1990 Unspecified osteoarthritis, unspecified site: Secondary | ICD-10-CM | POA: Insufficient documentation

## 2023-02-03 DIAGNOSIS — I130 Hypertensive heart and chronic kidney disease with heart failure and stage 1 through stage 4 chronic kidney disease, or unspecified chronic kidney disease: Secondary | ICD-10-CM | POA: Diagnosis not present

## 2023-02-03 DIAGNOSIS — Z7984 Long term (current) use of oral hypoglycemic drugs: Secondary | ICD-10-CM | POA: Diagnosis not present

## 2023-02-03 DIAGNOSIS — Z7189 Other specified counseling: Secondary | ICD-10-CM | POA: Diagnosis not present

## 2023-02-03 DIAGNOSIS — I3139 Other pericardial effusion (noninflammatory): Secondary | ICD-10-CM | POA: Diagnosis not present

## 2023-02-03 DIAGNOSIS — Z79899 Other long term (current) drug therapy: Secondary | ICD-10-CM | POA: Diagnosis not present

## 2023-02-03 DIAGNOSIS — R202 Paresthesia of skin: Secondary | ICD-10-CM | POA: Insufficient documentation

## 2023-02-03 DIAGNOSIS — M7989 Other specified soft tissue disorders: Secondary | ICD-10-CM | POA: Insufficient documentation

## 2023-02-03 DIAGNOSIS — N184 Chronic kidney disease, stage 4 (severe): Secondary | ICD-10-CM | POA: Insufficient documentation

## 2023-02-03 DIAGNOSIS — I10 Essential (primary) hypertension: Secondary | ICD-10-CM

## 2023-02-03 DIAGNOSIS — R0602 Shortness of breath: Secondary | ICD-10-CM | POA: Diagnosis not present

## 2023-02-03 DIAGNOSIS — I350 Nonrheumatic aortic (valve) stenosis: Secondary | ICD-10-CM | POA: Diagnosis not present

## 2023-02-03 DIAGNOSIS — Z8679 Personal history of other diseases of the circulatory system: Secondary | ICD-10-CM | POA: Insufficient documentation

## 2023-02-03 DIAGNOSIS — I43 Cardiomyopathy in diseases classified elsewhere: Secondary | ICD-10-CM | POA: Insufficient documentation

## 2023-02-03 DIAGNOSIS — I5032 Chronic diastolic (congestive) heart failure: Secondary | ICD-10-CM | POA: Diagnosis not present

## 2023-02-03 DIAGNOSIS — I2510 Atherosclerotic heart disease of native coronary artery without angina pectoris: Secondary | ICD-10-CM | POA: Insufficient documentation

## 2023-02-03 DIAGNOSIS — Z993 Dependence on wheelchair: Secondary | ICD-10-CM | POA: Insufficient documentation

## 2023-02-03 DIAGNOSIS — R200 Anesthesia of skin: Secondary | ICD-10-CM | POA: Insufficient documentation

## 2023-02-03 DIAGNOSIS — R5381 Other malaise: Secondary | ICD-10-CM | POA: Diagnosis not present

## 2023-02-03 DIAGNOSIS — I440 Atrioventricular block, first degree: Secondary | ICD-10-CM

## 2023-02-03 LAB — BASIC METABOLIC PANEL
Anion gap: 13 (ref 5–15)
BUN: 121 mg/dL — ABNORMAL HIGH (ref 8–23)
CO2: 24 mmol/L (ref 22–32)
Calcium: 8.6 mg/dL — ABNORMAL LOW (ref 8.9–10.3)
Chloride: 103 mmol/L (ref 98–111)
Creatinine, Ser: 3.32 mg/dL — ABNORMAL HIGH (ref 0.44–1.00)
GFR, Estimated: 14 mL/min — ABNORMAL LOW (ref >60)
Glucose, Bld: 94 mg/dL (ref 70–99)
Potassium: 3.8 mmol/L (ref 3.5–5.1)
Sodium: 140 mmol/L (ref 135–145)

## 2023-02-03 LAB — BRAIN NATRIURETIC PEPTIDE: B Natriuretic Peptide: 2927.1 pg/mL — ABNORMAL HIGH (ref 0.0–100.0)

## 2023-02-03 MED ORDER — TORSEMIDE 20 MG PO TABS: 60.0000 mg | ORAL_TABLET | Freq: Every day | ORAL | 6 refills | Status: DC

## 2023-02-03 MED ORDER — POTASSIUM CHLORIDE CRYS ER 20 MEQ PO TBCR: 20.0000 meq | EXTENDED_RELEASE_TABLET | Freq: Every day | ORAL | 3 refills | Status: DC

## 2023-02-03 NOTE — Progress Notes (Signed)
[facility] Note    PCP: Gwenevere Abbot, MD Cardiology: Dr. Excell Seltzer HF Cardiology: Dr. Shirlee Latch  77 y.o. with history of aortic stenosis, chronic diastolic CHF, and CAD was referred by Dr. Excell Seltzer for evaluation of CHF.  Based on 12/21 echo and RHC/LHC in 12/21, the patient has moderate aortic stenosis. She had coronary angiography in 4/15 with rather diffuse moderate-severe coronary disease that was managed medically.  Cardiac MRI in 11/19 was concerning for cardiac amyloidosis. However, PYP scan was negative in 11/19.  Workup at that time also did not show a monoclonal protein to suggest AL amyloidosis.    Echo in 11/22 showed EF 60-65%, severe LVH, normal RV size and systolic function, mild MR, paradoxical low flow/low gradient severe aortic stenosis with mean gradient 24 mmHg and AVA 0.88 cm^2, small to moderate pericardial effusion.  PYP scan was repeated in 11/22, grade 1 with H/CL 1.07.  This is unlikely to be TTR cardiac amyloidosis.  Repeat urine immunofixation and myeloma panel were negative as well.  [employer] gene testing negative for hATTR mutation.   She was evaluated by hematology for pancytopenia.  This improved without intervention.   Follow up 07/26/21, BP markedly elevated and given 50 mg hydralazine PO in clinic and hydralazine increased to 50 mg tid.    Echo in 9/23 showed EF 60-65%, severe concentric LVH, normal RV, PASP 43, moderate pericardial effusion without tamponade, mild-moderate MR, moderate mitral stenosis with mean gradient 7 mmHg, severe low flow/low gradient AS mean gradient 30 with AVA 0.95 cm^2, IVC normal.   Follow up 10/23, NYHA II-III and mildly volume overloaded, Lasix increased to 60 daily.   Admitted 7/24 with a/c HF. Echo showed AS severe. Structural Team consulted and felt not TAVR candidate. Diuresed with IV lasix and GDMT titrated, limited by kidney function. She was seen by PMT and code updated to DNR, with plans for palliative care  outpatient.  She was discharged home, weight 171 lbs.  Today she returns for HF follow up with her husband. Overall feeling fair. She has felt light-headed and nauseated x 3-4 days. She had to sleep elevated last night and feels more SOB today. Leg are swelling. She has early satiety. Denies palpitations, CP, or PND. She is unable to stand for weight. Taking all medications.   ECG (personally reviewed): SR with 1AVB, PR 448 msec, 1 PVC  ReDs: 37%  Labs (6/23): LDL 119 Labs (9/23): K 4.1, creatinine 2.11 Labs (11/23): K 3.3, creatinine 2.29 Labs (7/24): K 3.5, creatinine 2.65 Labs (9/24): K 3.7, creatinine 3.49  Labs (10/24): K 3.7, creatinine 2.94  PMH: 1. CKD stage 3 2. H/o seizure disorder 3. H/o TIA 4. H/o Mobitz type 1 2nd degree AVB 5. HTN 6. CAD: LHC (4/15) with 80% and 50% tandem stenoses in the proximal LAD, 80% mid LCx, 85% dLCx, diffuse mid-distal disease up to 90% in RCA.  Diffuse coronary disease, treated medically.  7. Aortic stenosis: Suspect moderate AS. - Echo (12/21): AoV mean gradient 32, AVA 1.2 cm^2.  - RHC/LHC (12/21): AoV mean gradient 22, AVA 1.47 cm^2.  - Echo (11/22): EF 60-65%, severe LVH, normal RV size and systolic function, mild MR, paradoxical low flow/low gradient severe aortic stenosis with mean gradient 24 mmHg and AVA 0.88 cm^2, small to moderate pericardial effusion.  - Echo (7/24): severe AS, mean gradient worsened to 50 mmHg. 8. Chronic diastolic CHF:  - Cardiac MRI (16/10): EF 76%, moderate LVH, difficult to null LV myocardium with diffuse  subendocardial LGE concerning for possible cardiac amyloidosis, moderate pericardial effusion.  - PYP scan (11/19): grade 0, H/CL 1.08.  - Echo (12/21): EF 60-65%, moderate LVH, normal RV, moderate pericardial effusion, moderate AS with AoV mean gradient 32, AVA 1.2 cm^2.  - Echo (11/22): EF 60-65%, severe LVH, normal RV size and systolic function, mild MR, paradoxical low flow/low gradient severe aortic stenosis  with mean gradient 24 mmHg and AVA 0.88 cm^2, small to moderate pericardial effusion.   - PYP scan (11/22): grade 1 with H/CL 1.07.  This is unlikely to be TTR cardiac amyloidosis. - [employer] gene testing negative for hATTR mutation.  - Echo (9/23): EF 60-65%, severe concentric LVH, normal RV, PASP 43, moderate pericardial effusion without tamponade, mild-moderate MR, moderate mitral stenosis with mean gradient 7 mmHg, severe low flow/low gradient AS mean gradient 30 with AVA 0.95 cm^2, IVC normal.  - Echo (7/24): EF 60-65%, severe LVH, normal RV, severe MAC, severe AS AVA 0.62 cm, AoV mean gradient 50 mmHg  9. Arthritis: Severe, limited to wheelchair.  10. Hyperlipidemia  Social History   Socioeconomic History   Marital status: Married    Spouse name: Johnny   Number of children: 2   Years of education: 4   Highest education level: Not on file  Occupational History   Occupation: retired  Tobacco Use   Smoking status: Former    Current packs/day: 0.00    Average packs/day: 0.5 packs/day for 10.0 years (5.0 ttl pk-yrs)    Types: Cigarettes    Start date: 06/04/1990    Quit date: 06/03/2000    Years since quitting: 22.6   Smokeless tobacco: Never  Vaping Use   Vaping status: Never Used  Substance and Sexual Activity   Alcohol use: No    Alcohol/week: 0.0 standard drinks of alcohol   Drug use: No   Sexual activity: Not Currently    Partners: Male  Other Topics Concern   Not on file  Social History Narrative   Current Social History 06/20/2020        Patient lives with spouse in a home which is 1 story. There are not steps up to the entrance, the patient uses a ramp.       Patient's method of transportation is [employer].      The highest level of education was some high school.      The patient currently disabled.      Identified important Relationships are "God and my husband"       Pets : None       Interests / Fun: "Sitting on my front porch"       Current Stressors: None         Son passed away.      Social Drivers of Corporate investment banker Strain: Low Risk  (09/18/2022)   Overall Financial Resource Strain (CARDIA)    Difficulty of Paying Living Expenses: Not hard at all  Food Insecurity: No Food Insecurity (09/18/2022)   Hunger Vital Sign    Worried About Running Out of Food in the Last Year: Never true    Ran Out of Food in the Last Year: Never true  Transportation Needs: No Transportation Needs (09/18/2022)   PRAPARE - Administrator, Civil Service (Medical): No    Lack of Transportation (Non-Medical): No  Physical Activity: Inactive (09/18/2022)   Exercise Vital Sign    Days of Exercise per Week: 0 days    Minutes of Exercise per  Session: 0 min  Stress: No Stress Concern Present (09/18/2022)   [employer] of Occupational Health - Occupational Stress Questionnaire    Feeling of Stress : Not at all  Social Connections: Moderately Isolated (09/18/2022)   Social Connection and Isolation Panel [NHANES]    Frequency of Communication with Friends and Family: More than three times a week    Frequency of Social Gatherings with Friends and Family: Not on file    Attends Religious Services: Never    Database administrator or Organizations: No    Attends Banker Meetings: Never    Marital Status: Married  Catering manager Violence: Not At Risk (09/18/2022)   Humiliation, Afraid, Rape, and Kick questionnaire    Fear of Current or Ex-Partner: No    Emotionally Abused: No    Physically Abused: No    Sexually Abused: No   Family History  Problem Relation Age of Onset   Hypertension Other    Heart disease Mother    Hypertension Mother    Cancer Father    Obesity Son    Asthma Sister    Heart disease Sister    Heart disease Sister    ROS: All systems reviewed and negative except as per HPI.   Current Outpatient Medications  Medication Sig Dispense Refill   acetaminophen (TYLENOL) 325 MG tablet Take 2 tablets (650 mg  total) by mouth every 6 (six) hours as needed for mild pain (or Fever >/= 101). 100 tablet 0   albuterol (PROVENTIL) (2.5 MG/3ML) 0.083% nebulizer solution Take 3 mLs (2.5 mg total) by nebulization every 6 (six) hours as needed for wheezing. 90 mL 12   aspirin 81 MG chewable tablet Chew 1 tablet (81 mg total) by mouth daily. 90 tablet 0   calcium carbonate (OSCAL) 1500 (600 Ca) MG TABS tablet Take 1 tablet (1,500 mg total) by mouth 3 (three) times daily with meals. 90 tablet 0   cholecalciferol (VITAMIN D3) 25 MCG (1000 UNIT) tablet Take 1 tablet (1,000 Units total) by mouth daily. 30 tablet 11   doxycycline (VIBRAMYCIN) 100 MG capsule Take 1 capsule (100 mg total) by mouth 2 (two) times daily. 14 capsule 0   febuxostat (ULORIC) 40 MG tablet Take 20 mg by mouth daily.     hydrALAZINE (APRESOLINE) 100 MG tablet Take 100 mg by mouth 3 (three) times daily.     Hydrocortisone (PREPARATION H EX) Apply 1 Application topically 2 (two) times daily as needed (Hemorrhoids).     JARDIANCE 10 MG TABS tablet TAKE 1 TABLET BY MOUTH DAILY  BEFORE BREAKFAST 90 tablet 3   lidocaine (LMX) 4 % cream Apply 1 Application topically 2 (two) times daily as needed (Pain on sacral wound area). 30 g 0   multivitamin (RENA-VIT) TABS tablet Take 1 tablet by mouth at bedtime. 100 tablet 0   pantoprazole (PROTONIX) 40 MG tablet TAKE 1 TABLET BY MOUTH DAILY 100 tablet 2   PHENobarbital (LUMINAL) 64.8 MG tablet Take 2 tablets (129.6 mg total) by mouth at bedtime. 200 tablet 3   potassium chloride (KLOR-CON M) 10 MEQ tablet Take 1 tablet (10 mEq total) by mouth once daily. 30 tablet 0   rosuvastatin (CRESTOR) 40 MG tablet Take 1 tablet (40 mg total) by mouth daily. 90 tablet 3   sevelamer carbonate (RENVELA) 800 MG tablet Take 1 tablet (800 mg total) by mouth 3 (three) times daily with meals. 90 tablet 11   torsemide (DEMADEX) 20 MG tablet TAKE 2 TABLETS  BY MOUTH DAILY AND AN EXTRA DOSE(2 TABLETS) IN THE PM FOR SWELLING OR SHORTNESS  OF BREATH 180 tablet 3   zonisamide (ZONEGRAN) 100 MG capsule TAKE 2 CAPSULES BY MOUTH AT NIGHT 200 capsule 3   No current facility-administered medications for this encounter.   Wt Readings from Last 3 Encounters:  11/11/22 77.1 kg (170 lb)  09/18/22 77.6 kg (171 lb)  09/10/22 77.6 kg (171 lb 1.2 oz)   BP (!) 172/74   Pulse 64   SpO2 98%   PHYSICAL EXAM: General:  NAD. No resp difficulty, arrived in [facility], fatigued-appearing. HEENT: Normal Neck: Supple. JVP 10. Carotids 2+ bilat; no bruits. No lymphadenopathy or thryomegaly appreciated. Cor: PMI nondisplaced. Regular rate & rhythm. No rubs, gallops, 3/6 SEM RUSB Lungs: Clear, diminished in bases Abdomen: Soft, nontender, nondistended. No hepatosplenomegaly. No bruits or masses. Good bowel sounds. Extremities: No cyanosis, clubbing, rash, 2+ BLE edema to knees Neuro: Alert & oriented x 3, cranial nerves grossly intact. Moves all 4 extremities w/o difficulty. Affect pleasant.  Assessment/Plan 1. Aortic stenosis: 12/21 LHC/RHC and echo were suggestive of moderate aortic stenosis.  Repeat echo in 11/22 showed paradoxical low flow/low gradient severe aortic stenosis, echo in 9/23 was similar.  Echo this admission (7/24) with severe AS, gradient higher.  She has been seen by Dr. Excell Seltzer in the past for TAVR evaluation.  She is very limited in terms of activity, basically wheel-chair bound at baseline. She has a long 1st degree AVB and has episodes of type 1 2nd degree AVB.  She also has CKD stage IV.  TAVR could be done, but high risk for progression to ESRD and need for pacemaker.  She would have a hard time with HD due to lack of mobility.  Evaluated by structural heart team, determined not a TAVR candidate.  - Dr. Excell Seltzer still following.  2. CAD: Moderate-severe diffuse CAD on coronary angiography in 4/15. She would not be a candidate for CABG due to lack of mobility. No chest pain. - Continue ASA 81 - Continue Crestor. 3. CKD stage IV:  Baseline creatinine around 2.5 +, likely new baseline 3.4. Followed by Dr. Signe Colt  - Continue SGLT2i. BMET today. 4. Chronic diastolic CHF: Cardiac MRI was concerning for cardiac amyloidosis (11/19), as was echo with moderate LVH and aortic stenosis. The aortic stenosis and the pericardial effusion seen by echo are also frequently seen with cardiac amyloidosis. She additionally has tingling/numbness in hands/feet that is consistent with peripheral neuropathy.  However, PYP scan in 11/19 was negative and myeloma workup was negative.  Repeat PYP scan in 11/22 was again negative and she did not have a transthyretin gene mutation on [employer] gene testing.  Myeloma workup was again negative in 10/22.  Therefore, cardiac amyloidosis seems unlikely and LVH may be due to HTN and aortic stenosis though delayed enhancement from MRI is more suggestive of infiltrative disease. Most recent echo showed EF 60-65%, severe LVH, normal RV, small to moderate pericardial effusion (unchanged), severe AS with mean gradient 50 mmHg (AS has been severe in past but mean gradient has progressed). Worse NYHA III-IIIb today, functional status confounded by being WC bound. She is volume overloaded on exam, and ReDs mildly elevated at 37%. - Increase torsemide to 40 mg bid x 3 days, then 60 mg daily thereafter (previously on 40 mg daily). BMET and BNP today, repeat BMET in 1 week. - Increase KCL to 20 daily. - Continue Jardiance 10 mg daily. No GU symptoms. - Continue compression  hose. - No ARB/ARNi or spiro with CKD. 5. HTN: BP elevated in clinic CKD IV, will avoid lowering BP too much  - Continue current med regimen. - Increase diuretics as above. 6. Conduction abnormality: Long 1st degree AVB, also runs of type 1 2nd degree AVB have been noted.   - Avoid beta blockade.  7. Debility: 2/2 severe arthritis. Minimally mobile. This is her baseline.  - She is unable to stand for weight today. 8. GOC: Pt with worsening severe AS,  conduction delay, diastolic CHF, known CAD medically managed and likely progressed, worsening CKD now stage IV/V, and minimal mobility. High risk of continued volume overload and ESRD without TAVR and high risk of ESRD and post TAVR heart block needing pacemaker with TAVR. I do not think she would do well on dialysis due to limited mobility.  - She is not ready for hospice, but she understands palliative management of her symptoms and preserving QOL is main goal. Needs on-going discussions.  Follow up in 3-4 weeks with APP (discuss paramedicine and fluid check).  Anderson Malta [place], FNP-BC 02/03/23

## 2023-02-03 NOTE — Patient Instructions (Addendum)
Thank you for coming in today  If you had labs drawn today, any labs that are abnormal the clinic will call you No news is good news  Medications: Increase Torsemide to 40 mg 2 tablets twice daily for 3 days 02/03/23 02/04/23 02/05/23 Then 02/06/23 60 mg 3 tablets daily   Increase Potassium to 20 meq 1 tablet daily    Follow up appointments:  Your physician recommends that you schedule a follow-up appointment in:  4 weeks in clinic 3 months With Dr. Shirlee Latch    Do the following things EVERYDAY: Weigh yourself in the morning before breakfast. Write it down and keep it in a log. Take your medicines as prescribed Eat low salt foods--Limit salt (sodium) to 2000 mg per day.  Stay as active as you can everyday Limit all fluids for the day to less than 2 liters   At the [facility], you and your health needs are our priority. As part of our continuing mission to provide you with exceptional heart care, we have created designated Provider Care Teams. These Care Teams include your primary Cardiologist (physician) and Advanced Practice Providers (APPs- Physician Assistants and Nurse Practitioners) who all work together to provide you with the care you need, when you need it.   You may see any of the following providers on your designated Care Team at your next follow up: Dr Arvilla Meres Dr Marca Ancona Dr. Marcos Eke, NP Robbie Lis, [place] [facility] [place], [place] Brynda Peon, NP Karle Plumber, PharmD   Please be sure to bring in all your medications bottles to every appointment.    Thank you for choosing  [facility]  If you have any questions or concerns before your next appointment please send [place] a message through [place] or call our office at [phone].    TO LEAVE A MESSAGE FOR THE NURSE SELECT OPTION 2, PLEASE LEAVE A MESSAGE INCLUDING: YOUR NAME DATE OF BIRTH CALL BACK  NUMBER REASON FOR CALL**this is important as we prioritize the call backs  YOU WILL RECEIVE A CALL BACK THE SAME DAY AS LONG AS YOU CALL BEFORE 4:00 PM

## 2023-02-03 NOTE — Progress Notes (Signed)
ReDS Vest / Clip - 02/03/23 1200       ReDS Vest / Clip   Station Marker A    Ruler Value 25    ReDS Value Range Moderate volume overload    ReDS Actual Value 37

## 2023-02-07 ENCOUNTER — Other Ambulatory Visit: Payer: Self-pay | Admitting: Neurology

## 2023-02-07 MED ORDER — PHENOBARBITAL 64.8 MG PO TABS: 129.6000 mg | ORAL_TABLET | Freq: Every day | ORAL | 3 refills | Status: DC

## 2023-02-07 NOTE — Telephone Encounter (Signed)
Left message with the after hour service on 02-06-23 at 4:42 pm  Caller states that patient is out of medication phenobarbital

## 2023-02-10 DIAGNOSIS — M7989 Other specified soft tissue disorders: Secondary | ICD-10-CM | POA: Diagnosis not present

## 2023-02-10 DIAGNOSIS — I5031 Acute diastolic (congestive) heart failure: Secondary | ICD-10-CM | POA: Diagnosis not present

## 2023-02-13 ENCOUNTER — Ambulatory Visit (HOSPITAL_COMMUNITY)
Admission: RE | Admit: 2023-02-13 | Discharge: 2023-02-13 | Disposition: A | Discharge status: Home / Self Care | Payer: 59 | Source: Ambulatory Visit | Attending: Internal Medicine | Admitting: Internal Medicine

## 2023-02-13 DIAGNOSIS — I5032 Chronic diastolic (congestive) heart failure: Secondary | ICD-10-CM | POA: Diagnosis not present

## 2023-02-13 LAB — BASIC METABOLIC PANEL
Anion gap: 18 — ABNORMAL HIGH (ref 5–15)
BUN: 115 mg/dL — ABNORMAL HIGH (ref 8–23)
CO2: 22 mmol/L (ref 22–32)
Calcium: 7.9 mg/dL — ABNORMAL LOW (ref 8.9–10.3)
Chloride: 99 mmol/L (ref 98–111)
Creatinine, Ser: 3.39 mg/dL — ABNORMAL HIGH (ref 0.44–1.00)
GFR, Estimated: 13 mL/min — ABNORMAL LOW (ref >60)
Glucose, Bld: 90 mg/dL (ref 70–99)
Potassium: 3.8 mmol/L (ref 3.5–5.1)
Sodium: 139 mmol/L (ref 135–145)

## 2023-02-25 ENCOUNTER — Other Ambulatory Visit: Payer: Self-pay

## 2023-02-25 ENCOUNTER — Encounter (HOSPITAL_COMMUNITY): Payer: Self-pay | Admitting: Emergency Medicine

## 2023-02-25 ENCOUNTER — Ambulatory Visit (INDEPENDENT_AMBULATORY_CARE_PROVIDER_SITE_OTHER): Payer: 59 | Admitting: Internal Medicine

## 2023-02-25 ENCOUNTER — Encounter: Payer: Self-pay | Admitting: Internal Medicine

## 2023-02-25 ENCOUNTER — Inpatient Hospital Stay (HOSPITAL_COMMUNITY)
Admission: EM | Admit: 2023-02-25 | Discharge: 2023-03-02 | DRG: 378 | Disposition: A | Discharge status: Home Health | Payer: 59 | Attending: Internal Medicine | Admitting: Internal Medicine

## 2023-02-25 VITALS — BP 128/71 | HR 70 | Temp 98.2°F | Ht 61.0 in

## 2023-02-25 DIAGNOSIS — D62 Acute posthemorrhagic anemia: Secondary | ICD-10-CM | POA: Diagnosis not present

## 2023-02-25 DIAGNOSIS — D61818 Other pancytopenia: Secondary | ICD-10-CM | POA: Diagnosis present

## 2023-02-25 DIAGNOSIS — K5900 Constipation, unspecified: Secondary | ICD-10-CM | POA: Diagnosis not present

## 2023-02-25 DIAGNOSIS — K8020 Calculus of gallbladder without cholecystitis without obstruction: Secondary | ICD-10-CM | POA: Diagnosis present

## 2023-02-25 DIAGNOSIS — K3189 Other diseases of stomach and duodenum: Secondary | ICD-10-CM | POA: Diagnosis not present

## 2023-02-25 DIAGNOSIS — D123 Benign neoplasm of transverse colon: Secondary | ICD-10-CM | POA: Diagnosis not present

## 2023-02-25 DIAGNOSIS — I3139 Other pericardial effusion (noninflammatory): Secondary | ICD-10-CM | POA: Diagnosis not present

## 2023-02-25 DIAGNOSIS — I350 Nonrheumatic aortic (valve) stenosis: Secondary | ICD-10-CM

## 2023-02-25 DIAGNOSIS — R718 Other abnormality of red blood cells: Secondary | ICD-10-CM | POA: Diagnosis not present

## 2023-02-25 DIAGNOSIS — D509 Iron deficiency anemia, unspecified: Secondary | ICD-10-CM | POA: Diagnosis present

## 2023-02-25 DIAGNOSIS — D649 Anemia, unspecified: Secondary | ICD-10-CM | POA: Diagnosis not present

## 2023-02-25 DIAGNOSIS — K921 Melena: Secondary | ICD-10-CM

## 2023-02-25 DIAGNOSIS — Z886 Allergy status to analgesic agent status: Secondary | ICD-10-CM

## 2023-02-25 DIAGNOSIS — I441 Atrioventricular block, second degree: Secondary | ICD-10-CM

## 2023-02-25 DIAGNOSIS — I5032 Chronic diastolic (congestive) heart failure: Secondary | ICD-10-CM | POA: Diagnosis not present

## 2023-02-25 DIAGNOSIS — I440 Atrioventricular block, first degree: Secondary | ICD-10-CM | POA: Diagnosis not present

## 2023-02-25 DIAGNOSIS — R5381 Other malaise: Secondary | ICD-10-CM

## 2023-02-25 DIAGNOSIS — D122 Benign neoplasm of ascending colon: Secondary | ICD-10-CM | POA: Diagnosis not present

## 2023-02-25 DIAGNOSIS — Z66 Do not resuscitate: Secondary | ICD-10-CM | POA: Diagnosis not present

## 2023-02-25 DIAGNOSIS — I132 Hypertensive heart and chronic kidney disease with heart failure and with stage 5 chronic kidney disease, or end stage renal disease: Secondary | ICD-10-CM | POA: Diagnosis present

## 2023-02-25 DIAGNOSIS — Z87891 Personal history of nicotine dependence: Secondary | ICD-10-CM | POA: Diagnosis not present

## 2023-02-25 DIAGNOSIS — K648 Other hemorrhoids: Secondary | ICD-10-CM | POA: Diagnosis present

## 2023-02-25 DIAGNOSIS — E785 Hyperlipidemia, unspecified: Secondary | ICD-10-CM | POA: Diagnosis present

## 2023-02-25 DIAGNOSIS — Z01810 Encounter for preprocedural cardiovascular examination: Secondary | ICD-10-CM | POA: Diagnosis not present

## 2023-02-25 DIAGNOSIS — R109 Unspecified abdominal pain: Secondary | ICD-10-CM

## 2023-02-25 DIAGNOSIS — L0291 Cutaneous abscess, unspecified: Secondary | ICD-10-CM | POA: Diagnosis not present

## 2023-02-25 DIAGNOSIS — N184 Chronic kidney disease, stage 4 (severe): Secondary | ICD-10-CM

## 2023-02-25 DIAGNOSIS — L89152 Pressure ulcer of sacral region, stage 2: Secondary | ICD-10-CM | POA: Diagnosis present

## 2023-02-25 DIAGNOSIS — E872 Acidosis, unspecified: Secondary | ICD-10-CM | POA: Diagnosis not present

## 2023-02-25 DIAGNOSIS — Z634 Disappearance and death of family member: Secondary | ICD-10-CM

## 2023-02-25 DIAGNOSIS — I25810 Atherosclerosis of coronary artery bypass graft(s) without angina pectoris: Secondary | ICD-10-CM

## 2023-02-25 DIAGNOSIS — Z7982 Long term (current) use of aspirin: Secondary | ICD-10-CM

## 2023-02-25 DIAGNOSIS — Z90710 Acquired absence of both cervix and uterus: Secondary | ICD-10-CM

## 2023-02-25 DIAGNOSIS — Z8249 Family history of ischemic heart disease and other diseases of the circulatory system: Secondary | ICD-10-CM

## 2023-02-25 DIAGNOSIS — N185 Chronic kidney disease, stage 5: Secondary | ICD-10-CM | POA: Diagnosis not present

## 2023-02-25 DIAGNOSIS — I2510 Atherosclerotic heart disease of native coronary artery without angina pectoris: Secondary | ICD-10-CM | POA: Diagnosis present

## 2023-02-25 DIAGNOSIS — R569 Unspecified convulsions: Secondary | ICD-10-CM

## 2023-02-25 DIAGNOSIS — K2970 Gastritis, unspecified, without bleeding: Secondary | ICD-10-CM | POA: Diagnosis not present

## 2023-02-25 DIAGNOSIS — I5022 Chronic systolic (congestive) heart failure: Secondary | ICD-10-CM | POA: Diagnosis not present

## 2023-02-25 DIAGNOSIS — K644 Residual hemorrhoidal skin tags: Secondary | ICD-10-CM | POA: Diagnosis present

## 2023-02-25 DIAGNOSIS — Z8673 Personal history of transient ischemic attack (TIA), and cerebral infarction without residual deficits: Secondary | ICD-10-CM | POA: Diagnosis not present

## 2023-02-25 DIAGNOSIS — R001 Bradycardia, unspecified: Secondary | ICD-10-CM | POA: Diagnosis not present

## 2023-02-25 DIAGNOSIS — Z7409 Other reduced mobility: Secondary | ICD-10-CM | POA: Diagnosis present

## 2023-02-25 DIAGNOSIS — R6889 Other general symptoms and signs: Secondary | ICD-10-CM | POA: Diagnosis not present

## 2023-02-25 DIAGNOSIS — K219 Gastro-esophageal reflux disease without esophagitis: Secondary | ICD-10-CM | POA: Diagnosis present

## 2023-02-25 DIAGNOSIS — I110 Hypertensive heart disease with heart failure: Secondary | ICD-10-CM | POA: Diagnosis not present

## 2023-02-25 DIAGNOSIS — N19 Unspecified kidney failure: Secondary | ICD-10-CM | POA: Insufficient documentation

## 2023-02-25 DIAGNOSIS — Z7984 Long term (current) use of oral hypoglycemic drugs: Secondary | ICD-10-CM

## 2023-02-25 DIAGNOSIS — I10 Essential (primary) hypertension: Secondary | ICD-10-CM

## 2023-02-25 DIAGNOSIS — N281 Cyst of kidney, acquired: Secondary | ICD-10-CM | POA: Diagnosis present

## 2023-02-25 DIAGNOSIS — Z79899 Other long term (current) drug therapy: Secondary | ICD-10-CM

## 2023-02-25 DIAGNOSIS — E739 Lactose intolerance, unspecified: Secondary | ICD-10-CM | POA: Diagnosis not present

## 2023-02-25 HISTORY — DX: Melena: K92.1

## 2023-02-25 HISTORY — DX: Unspecified abdominal pain: R10.9

## 2023-02-25 HISTORY — DX: Anemia, unspecified: D64.9

## 2023-02-25 LAB — CBC WITH DIFFERENTIAL/PLATELET
Abs Immature Granulocytes: 0.01 10*3/uL (ref 0.00–0.07)
Basophils Absolute: 0 10*3/uL (ref 0.0–0.1)
Basophils Relative: 0 %
Eosinophils Absolute: 0 10*3/uL (ref 0.0–0.5)
Eosinophils Relative: 0 %
HCT: 15.5 % — ABNORMAL LOW (ref 36.0–46.0)
Hemoglobin: 4.6 g/dL — CL (ref 12.0–15.0)
Immature Granulocytes: 0 %
Lymphocytes Relative: 26 %
Lymphs Abs: 0.6 10*3/uL — ABNORMAL LOW (ref 0.7–4.0)
MCH: 26 pg (ref 26.0–34.0)
MCHC: 29.7 g/dL — ABNORMAL LOW (ref 30.0–36.0)
MCV: 87.6 fL (ref 80.0–100.0)
Monocytes Absolute: 0.3 10*3/uL (ref 0.1–1.0)
Monocytes Relative: 13 %
Neutro Abs: 1.4 10*3/uL — ABNORMAL LOW (ref 1.7–7.7)
Neutrophils Relative %: 61 %
Platelets: 185 10*3/uL (ref 150–400)
RBC: 1.77 MIL/uL — ABNORMAL LOW (ref 3.87–5.11)
RDW: 17.8 % — ABNORMAL HIGH (ref 11.5–15.5)
WBC: 2.3 10*3/uL — ABNORMAL LOW (ref 4.0–10.5)
nRBC: 0 % (ref 0.0–0.2)

## 2023-02-25 LAB — CBC
HCT: 15.6 % — ABNORMAL LOW (ref 36.0–46.0)
Hemoglobin: 4.7 g/dL — CL (ref 12.0–15.0)
MCH: 26.1 pg (ref 26.0–34.0)
MCHC: 30.1 g/dL (ref 30.0–36.0)
MCV: 86.7 fL (ref 80.0–100.0)
Platelets: 175 10*3/uL (ref 150–400)
RBC: 1.8 MIL/uL — ABNORMAL LOW (ref 3.87–5.11)
RDW: 17.7 % — ABNORMAL HIGH (ref 11.5–15.5)
WBC: 2.5 10*3/uL — ABNORMAL LOW (ref 4.0–10.5)
nRBC: 0.8 % — ABNORMAL HIGH (ref 0.0–0.2)

## 2023-02-25 LAB — COMPREHENSIVE METABOLIC PANEL
ALT: 60 U/L — ABNORMAL HIGH (ref 0–44)
ALT: 61 U/L — ABNORMAL HIGH (ref 0–44)
AST: 56 U/L — ABNORMAL HIGH (ref 15–41)
AST: 62 U/L — ABNORMAL HIGH (ref 15–41)
Albumin: 3.3 g/dL — ABNORMAL LOW (ref 3.5–5.0)
Albumin: 3.3 g/dL — ABNORMAL LOW (ref 3.5–5.0)
Alkaline Phosphatase: 131 U/L — ABNORMAL HIGH (ref 38–126)
Alkaline Phosphatase: 125 U/L (ref 38–126)
Anion gap: 18 — ABNORMAL HIGH (ref 5–15)
Anion gap: 17 — ABNORMAL HIGH (ref 5–15)
BUN: 138 mg/dL — ABNORMAL HIGH (ref 8–23)
BUN: 140 mg/dL — ABNORMAL HIGH (ref 8–23)
CO2: 20 mmol/L — ABNORMAL LOW (ref 22–32)
CO2: 19 mmol/L — ABNORMAL LOW (ref 22–32)
Calcium: 7.9 mg/dL — ABNORMAL LOW (ref 8.9–10.3)
Calcium: 7.8 mg/dL — ABNORMAL LOW (ref 8.9–10.3)
Chloride: 99 mmol/L (ref 98–111)
Chloride: 101 mmol/L (ref 98–111)
Creatinine, Ser: 3.55 mg/dL — ABNORMAL HIGH (ref 0.44–1.00)
Creatinine, Ser: 3.64 mg/dL — ABNORMAL HIGH (ref 0.44–1.00)
GFR, Estimated: 13 mL/min — ABNORMAL LOW (ref >60)
GFR, Estimated: 12 mL/min — ABNORMAL LOW (ref >60)
Glucose, Bld: 111 mg/dL — ABNORMAL HIGH (ref 70–99)
Glucose, Bld: 149 mg/dL — ABNORMAL HIGH (ref 70–99)
Potassium: 3.5 mmol/L (ref 3.5–5.1)
Potassium: 3.5 mmol/L (ref 3.5–5.1)
Sodium: 137 mmol/L (ref 135–145)
Sodium: 137 mmol/L (ref 135–145)
Total Bilirubin: 0.6 mg/dL (ref 0.0–1.2)
Total Bilirubin: 0.5 mg/dL (ref 0.0–1.2)
Total Protein: 6.5 g/dL (ref 6.5–8.1)
Total Protein: 6.5 g/dL (ref 6.5–8.1)

## 2023-02-25 LAB — RETICULOCYTES
Immature Retic Fract: 12.3 % (ref 2.3–15.9)
RBC.: 1.77 MIL/uL — ABNORMAL LOW (ref 3.87–5.11)
Retic Count, Absolute: 22.3 10*3/uL (ref 19.0–186.0)
Retic Ct Pct: 1.3 % (ref 0.4–3.1)

## 2023-02-25 LAB — ABO/RH: ABO/RH(D): O POS

## 2023-02-25 LAB — FOLATE: Folate: 30.6 ng/mL (ref >5.9)

## 2023-02-25 LAB — IRON AND TIBC
Iron: 16 ug/dL — ABNORMAL LOW (ref 28–170)
Saturation Ratios: 4 % — ABNORMAL LOW (ref 10.4–31.8)
TIBC: 388 ug/dL (ref 250–450)
UIBC: 372 ug/dL

## 2023-02-25 LAB — VITAMIN B12: Vitamin B-12: 469 pg/mL (ref 180–914)

## 2023-02-25 LAB — FERRITIN: Ferritin: 12 ng/mL (ref 11–307)

## 2023-02-25 LAB — PREPARE RBC (CROSSMATCH)

## 2023-02-25 MED ORDER — ROSUVASTATIN CALCIUM 20 MG PO TABS
40.0000 mg | ORAL_TABLET | Freq: Every day | ORAL | Status: DC
  Administered 2023-02-26 – 2023-03-02 (×5): 40 mg via ORAL
  Filled 2023-02-25 (×5): qty 2

## 2023-02-25 MED ORDER — PHENOBARBITAL 32.4 MG PO TABS
129.6000 mg | ORAL_TABLET | Freq: Every day | ORAL | Status: DC
  Administered 2023-02-25 – 2023-03-01 (×5): 129.6 mg via ORAL
  Filled 2023-02-25 (×5): qty 4

## 2023-02-25 MED ORDER — SENNOSIDES-DOCUSATE SODIUM 8.6-50 MG PO TABS
2.0000 | ORAL_TABLET | Freq: Every day | ORAL | 0 refills | Status: AC
Start: 2023-02-25 — End: 2023-05-26

## 2023-02-25 MED ORDER — FEBUXOSTAT 40 MG PO TABS
20.0000 mg | ORAL_TABLET | Freq: Every day | ORAL | Status: DC
  Administered 2023-02-26 – 2023-03-02 (×5): 20 mg via ORAL
  Filled 2023-02-25 (×5): qty 1

## 2023-02-25 MED ORDER — TORSEMIDE 20 MG PO TABS
60.0000 mg | ORAL_TABLET | Freq: Every day | ORAL | Status: DC
  Administered 2023-02-26 – 2023-03-02 (×5): 60 mg via ORAL
  Filled 2023-02-25 (×6): qty 3

## 2023-02-25 MED ORDER — PANTOPRAZOLE SODIUM 40 MG IV SOLR
40.0000 mg | Freq: Two times a day (BID) | INTRAVENOUS | Status: DC
  Administered 2023-02-26 – 2023-03-01 (×8): 40 mg via INTRAVENOUS
  Filled 2023-02-25 (×8): qty 10

## 2023-02-25 MED ORDER — SEVELAMER CARBONATE 800 MG PO TABS
800.0000 mg | ORAL_TABLET | Freq: Three times a day (TID) | ORAL | Status: DC
  Administered 2023-02-26 – 2023-03-02 (×8): 800 mg via ORAL
  Filled 2023-02-25 (×10): qty 1

## 2023-02-25 MED ORDER — VITAMIN D 25 MCG (1000 UNIT) PO TABS
1000.0000 [IU] | ORAL_TABLET | Freq: Every day | ORAL | Status: DC
Start: 2023-02-26 — End: 2023-03-02
  Administered 2023-02-26 – 2023-03-02 (×5): 1000 [IU] via ORAL
  Filled 2023-02-25 (×5): qty 1

## 2023-02-25 MED ORDER — SODIUM CHLORIDE 0.9% IV SOLUTION
Freq: Once | INTRAVENOUS | Status: AC
  Administered 2023-02-28: 300 mL via INTRAVENOUS

## 2023-02-25 MED ORDER — CALCIUM CARBONATE 1250 (500 CA) MG PO TABS
1.0000 | ORAL_TABLET | Freq: Three times a day (TID) | ORAL | Status: DC
Start: 2023-02-26 — End: 2023-03-02
  Administered 2023-02-26 – 2023-03-02 (×12): 1250 mg via ORAL
  Filled 2023-02-25 (×15): qty 1

## 2023-02-25 NOTE — ED Provider Notes (Signed)
 [facility] Provider Note   CSN: [id number] Arrival date & time: 02/25/23  1415     History  Chief Complaint  Patient presents with   low hgb    Sherry [name] is a 78 y.o. female.  HPI Female presents with concern for ongoing weakness, fatigue, fatigue with exertion in particular.  Today she went to clinic and was evaluated, and per report was found to be anemic and sent here.  Patient denies any hematochezia, melena, blood per rectum.  She does have CKD, and has baseline hemoglobin in the sevens.  Patient does have constipation as well and takes stool softeners, though these typically work and she has noticed no blood recently.  No current chest pain, or other focal complaints at rest.    Home Medications Prior to Admission medications   Medication Sig Start Date End Date Taking? Authorizing Provider  acetaminophen  (TYLENOL ) 325 MG tablet Take 2 tablets (650 mg total) by mouth every 6 (six) hours as needed for mild pain (or Fever >/= 101). 11/21/22   Jolaine Pac, DO  albuterol  (PROVENTIL ) (2.5 MG/3ML) 0.083% nebulizer solution Take 3 mLs (2.5 mg total) by nebulization every 6 (six) hours as needed for wheezing. 09/10/22   Elnora Ip, MD  aspirin  81 MG chewable tablet Chew 1 tablet (81 mg total) by mouth daily. 09/11/22   Elnora Ip, MD  calcium  carbonate (OSCAL) 1500 (600 Ca) MG TABS tablet Take 1 tablet (1,500 mg total) by mouth 3 (three) times daily with meals. 10/14/22   Fernand Prost, MD  cholecalciferol  (VITAMIN D3) 25 MCG (1000 UNIT) tablet Take 1 tablet (1,000 Units total) by mouth daily. 11/21/22 11/21/23  Jolaine Pac, DO  doxycycline  (VIBRAMYCIN ) 100 MG capsule Take 1 capsule (100 mg total) by mouth 2 (two) times daily. 10/29/22   Nooruddin, Saad, MD  febuxostat  (ULORIC ) 40 MG tablet Take 20 mg by mouth daily. 08/07/22   [provider]  hydrALAZINE  (APRESOLINE ) 100 MG tablet TAKE 1 TABLET(100  MG) BY MOUTH THREE TIMES DAILY 02/04/23 02/04/24  Fernand Prost, MD  Hydrocortisone (PREPARATION H EX) Apply 1 Application topically 2 (two) times daily as needed (Hemorrhoids).    [provider]  JARDIANCE  10 MG TABS tablet TAKE 1 TABLET BY MOUTH DAILY  BEFORE BREAKFAST 12/27/22   Fernand Prost, MD  lidocaine  (LMX) 4 % cream Apply 1 Application topically 2 (two) times daily as needed (Pain on sacral wound area). 09/10/22   Gomez-Caraballo, Maria, MD  multivitamin (RENA-VIT) TABS tablet Take 1 tablet by mouth at bedtime. 09/10/22   Elnora Ip, MD  pantoprazole  (PROTONIX ) 40 MG tablet TAKE 1 TABLET BY MOUTH DAILY 11/22/22   Fernand Prost, MD  PHENobarbital  (LUMINAL) 64.8 MG tablet Take 2 tablets (129.6 mg total) by mouth at bedtime. 02/07/23   Georjean Darice HERO, MD  potassium chloride  SA (KLOR-CON  M) 20 MEQ tablet Take 1 tablet (20 mEq total) by mouth daily. 02/03/23   Milford, Harlene HERO, FNP  rosuvastatin  (CRESTOR ) 40 MG tablet Take 1 tablet (40 mg total) by mouth daily. 01/14/22 02/03/24  Glena Harlene HERO, FNP  senna-docusate (SENOKOT S) 8.6-50 MG tablet Take 2 tablets by mouth daily. 02/25/23 05/26/23  Fernand Prost, MD  sevelamer  carbonate (RENVELA ) 800 MG tablet Take 1 tablet (800 mg total) by mouth 3 (three) times daily with meals. 11/21/22 11/21/23  Jolaine Pac, DO  torsemide  (DEMADEX ) 20 MG tablet Take 3 tablets (60 mg total) by mouth daily. TAKE 2 TABLETS BY  MOUTH DAILY AND AN EXTRA DOSE(2 TABLETS) IN THE PM FOR SWELLING OR SHORTNESS OF BREATH 02/06/23   Glena Harlene HERO, FNP  zonisamide  (ZONEGRAN ) 100 MG capsule TAKE 2 CAPSULES BY MOUTH AT NIGHT 10/02/22   Georjean Darice HERO, MD      Allergies    Ibuprofen and Lactose intolerance (gi)    Review of Systems   Review of Systems  Physical Exam Updated Vital Signs BP (!) 138/92   Pulse 72   Temp 98.5 F (36.9 C) (Oral)   Resp 17   SpO2 100%  Physical Exam Vitals and nursing note reviewed.  Constitutional:      General:  She is not in acute distress.    Appearance: She is well-developed.  HENT:     Head: Normocephalic and atraumatic.  Eyes:     Conjunctiva/sclera: Conjunctivae normal.  Cardiovascular:     Rate and Rhythm: Normal rate and regular rhythm.  Pulmonary:     Effort: Pulmonary effort is normal. No respiratory distress.     Breath sounds: Normal breath sounds. No stridor.  Abdominal:     General: There is no distension.     Tenderness: There is no abdominal tenderness.  Musculoskeletal:     Right lower leg: Edema present.     Left lower leg: Edema present.  Skin:    General: Skin is warm and dry.  Neurological:     Mental Status: She is alert and oriented to person, place, and time.     Cranial Nerves: No cranial nerve deficit.  Psychiatric:        Mood and Affect: Mood normal.     ED Results / Procedures / Treatments   Labs (all labs ordered are listed, but only abnormal results are displayed) Labs Reviewed  COMPREHENSIVE METABOLIC PANEL - Abnormal; Notable for the following components:      Result Value   CO2 19 (*)    Glucose, Bld 149 (*)    BUN 140 (*)    Creatinine, Ser 3.64 (*)    Calcium  7.8 (*)    Albumin 3.3 (*)    AST 62 (*)    ALT 61 (*)    GFR, Estimated 12 (*)    Anion gap 17 (*)    All other components within normal limits  CBC - Abnormal; Notable for the following components:   WBC 2.5 (*)    RBC 1.80 (*)    Hemoglobin 4.7 (*)    HCT 15.6 (*)    RDW 17.7 (*)    nRBC 0.8 (*)    All other components within normal limits  TYPE AND SCREEN  ABO/RH  PREPARE RBC (CROSSMATCH)    EKG None  Radiology No results found.  Procedures Procedures    Medications Ordered in ED Medications  0.9 %  sodium chloride  infusion (Manually program via Guardrails IV Fluids) (0 mLs Intravenous Hold 02/25/23 1529)    ED Course/ Medical Decision Making/ A&P                                 Medical Decision Making Elderly female with multiple medical problems  including CKD, anemia, presents with weakness, fatigue, no abdominal pain, no syncope, but with lightheadedness, weakness, concern for recurrent anemia as suggested by outpatient labs.  On monitor patient has cardiac rhythm 75 sinus normal Pulse ox 100% room air normal   Amount and/or Complexity of Data Reviewed External Data  Reviewed: notes.    Details: Clinic note from earlier today reviewed Labs: ordered. Decision-making details documented in ED Course. ECG/medicine tests: ordered and independent interpretation performed. Decision-making details documented in ED Course.  Risk Prescription drug management. Decision regarding hospitalization. Diagnosis or treatment significantly limited by social determinants of health.   Repeat exam patient in similar condition, awake, alert.  I discussed her case with our internal medicine colleagues, we discussed the patient's presentation.  With concern for critical hemoglobin value, 4, patient and I discussed the indication for transfusion and the patient was amenable to this.  Patient started on a blood transfusion, admitted to internal medicine colleagues.  Final Clinical Impression(s) / ED Diagnoses Final diagnoses:  Symptomatic anemia   CRITICAL CARE Performed by: Lamar Salen Total critical care time: 35 minutes Critical care time was exclusive of separately billable procedures and treating other patients. Critical care was necessary to treat or prevent imminent or life-threatening deterioration. Critical care was time spent personally by me on the following activities: development of treatment plan with patient and/or surrogate as well as nursing, discussions with consultants, evaluation of patient's response to treatment, examination of patient, obtaining history from patient or surrogate, ordering and performing treatments and interventions, ordering and review of laboratory studies, ordering and review of radiographic studies, pulse  oximetry and re-evaluation of patient's condition.    Salen Lamar, MD 02/25/23 [phone]

## 2023-02-25 NOTE — Assessment & Plan Note (Signed)
 Resolved. Pt completed her antibiotics.

## 2023-02-25 NOTE — H&P (Addendum)
 Date: 02/25/2023               Patient Name:  Sherry [name] MRN: [id number]  DOB: 28-May-1945 Age / Sex: 78 y.o., female   PCP: Fernand Prost, MD         Medical Service: Internal Medicine Teaching Service         Attending Physician: Dr. Rosan Dayton BROCKS, DO      First Contact: Dr. Damien Lease, DO Pager [phone]    Second Contact: Dr. Ozell Kung, MD Pager [phone]         After Hours (After 5p/  First Contact Pager: [phone]  weekends / holidays): Second Contact Pager: [phone]   SUBJECTIVE   Chief Complaint: Anemia  History of Present Illness:  Sherry [name] is a 78 year old female with a past medical history of severe aortic stenosis, CKD stage V, chronic anemia, seizure disorder who originally presented to the internal medicine clinic this morning with her husband and she was examined due to abdominal pain.  A CBC was obtained which showed a hemoglobin count of 4.6 was instructed to go to emergency department for admission.  She reports 3-week history of abdominal pain and melena.  Patient reports additional symptoms of fatigue and shortness of breath that started 3 weeks ago as well.  She denies bright red blood per rectum.  She denies taking other NSAIDs except for daily aspirin , she denies use of blood thinners, she denies using BC Goody powder, she does report taking iron  supplements.  She denies alcohol use.  Review of Systems: A complete ROS was negative except as per HPI.   Past Medical History: Severe aortic stenosis CKD stage V Chronic anemia Seizure disorder  Meds:  Tylenol  325 mg  Albuterol  nebulizer Aspirin  81 mg Calcium  carbonate 1500 mg 3 times daily Vitamin D3 Doxycycline  100 mg twice daily Febuxostat  (ULORIC ) 40 MG tablet Take 20 mg by mouth daily.  hydrALAZINE  (APRESOLINE ) 100 MG tablet  Hydrocortisone (PREPARATION H EX)  JARDIANCE  10 MG TABS tablet  lidocaine  (LMX) 4 % cream  multivitamin (RENA-VIT) TABS tablet pantoprazole   (PROTONIX ) 40 MG tablet PHENobarbital  (LUMINAL) 64.8 MG tablet  potassium chloride  SA (KLOR-CON  M) 20 MEQ tablet  rosuvastatin  (CRESTOR ) 40 MG tablet  sevelamer  carbonate (RENVELA ) 800 MG tablet torsemide  (DEMADEX ) 20 MG tablet Take 3 tablets (60 mg total) by mouth daily.  zonisamide  (ZONEGRAN ) 100 MG capsule        Allergies: Allergies as of 02/25/2023 - Review Complete 02/25/2023  Allergen Reaction Noted   Ibuprofen Other (See Comments) 10/30/2012   Lactose intolerance (gi) Diarrhea 09/03/2022    Past Surgical History:  Procedure Laterality Date   ABDOMINAL HYSTERECTOMY     LEFT AND RIGHT HEART CATHETERIZATION WITH CORONARY ANGIOGRAM N/A 05/24/2013   Procedure: LEFT AND RIGHT HEART CATHETERIZATION WITH CORONARY ANGIOGRAM;  Surgeon: Victory LELON Claudene DOUGLAS, MD;  Location: [facility] CATH LAB;  Service: Cardiovascular;  Laterality: N/A;   LEFT HEART CATH N/A 02/01/2020   Procedure: Left Heart Cath;  Surgeon: Cherrie Toribio SAUNDERS, MD;  Location: [facility] INVASIVE CV LAB;  Service: Cardiovascular;  Laterality: N/A;   RIGHT HEART CATH N/A 02/01/2020   Procedure: RIGHT HEART CATH;  Surgeon: Cherrie Toribio SAUNDERS, MD;  Location: [facility] INVASIVE CV LAB;  Service: Cardiovascular;  Laterality: N/A;    Social:  Lives With: Husband at home Occupation: N/A Support: Family support Level of Function: Dependent ADLs and IADLs, mobility impaired, uses wheelchair and walker PCP: Dr. Fernand  Substances: Denies  recent tobacco, alcohol, illicit drug use  OBJECTIVE:   Physical Exam: Blood pressure (!) 140/60, pulse 75, temperature 98.2 F (36.8 C), temperature source Oral, resp. rate 18, SpO2 100%.  Constitutional: Chronically ill-appearing female, laying in bed, in no acut distress  HENT: normocephalic atraumatic Cardiovascular: regular rate and rhythm, loud systolic murmur present diffusely on chest, radiates to bilateral carotids Pulmonary/Chest: normal work of breathing on room air, lungs clear to auscultation  bilaterally.  Abdominal: soft, non-tender, non-distended. Rectal Exam performed, no stool, blood, or melena present on exam. Exam with chaperone (Dr. Norrine) Neurological: alert & weight, participating in conversation MSK: no gross abnormalities. 2+pitting edema extending to sacrum, no pitting edema present on upper extremities Skin: warm and dry Psych: Normal mood and affect  Labs:    Latest Ref Rng & Units 02/25/2023    2:24 PM 02/25/2023   10:59 AM 09/10/2022    2:17 AM  CBC  WBC 4.0 - 10.5 K/uL 2.5  2.3  2.0   Hemoglobin 12.0 - 15.0 g/dL 4.7  4.6  7.3   Hematocrit 36.0 - 46.0 % 15.6  15.5  23.5   Platelets 150 - 400 K/uL 175  185  260          Latest Ref Rng & Units 02/25/2023    2:24 PM 02/25/2023   10:59 AM 02/13/2023    9:08 AM  CMP  Glucose 70 - 99 mg/dL 850  888  90   BUN 8 - 23 mg/dL 859  861  884   Creatinine 0.44 - 1.00 mg/dL 6.35  6.44  6.60   Sodium 135 - 145 mmol/L 137  137  139   Potassium 3.5 - 5.1 mmol/L 3.5  3.5  3.8   Chloride 98 - 111 mmol/L 101  99  99   CO2 22 - 32 mmol/L 19  20  22    Calcium  8.9 - 10.3 mg/dL 7.8  7.9  7.9   Total Protein 6.5 - 8.1 g/dL 6.5  6.5    Total Bilirubin 0.0 - 1.2 mg/dL 0.5  0.6    Alkaline Phos 38 - 126 U/L 125  131    AST 15 - 41 U/L 62  56    ALT 0 - 44 U/L 61  60      eGFR:12  Imaging: No results found.    EKG: personally reviewed my interpretation is 1st degree AV block. Prior EKG shows Wenckebach.   ASSESSMENT & PLAN:   Assessment & Plan by Problem: Principal Problem:   Symptomatic anemia Active Problems:   Aortic stenosis   ASCVD (arteriosclerotic cardiovascular disease)   Seizures (HCC)   Melena   CKD (chronic kidney disease) stage 5, GFR less than 15 ml/min (HCC)   Declining functional status   Uremia   Sherry [name] is a 78 y.o. person living with a past medical history of severe aortic stenosis, CKD stage V, chronic anemia, seizure disorder who originally [facility] for stomach [facility] in [facility] and  admitted for symptomatic anemia on hospital day 0  Symptomatic anemia Acute on Chronic Anemia Suspect chronic upper GI blood loss with symptoms of 3 weeks of melena.  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  She is on a daily aspirin .  She does not take NSAIDs or BC/Goody powders.  Do not think she has brisk bleeding, no need for urgent endoscopy or CT at this time.  She is hemodynamically stable.  We will transfuse  to target hemoglobin is greater than sign 7 g/dL.  I will ask gastroenterology to see her tomorrow to evaluate her for endoscopy.   -Will make her n.p.o. tonight.  -Start a PPI via her IV.  Will check an a.m. CBC.  Severe Aortic Stenosis Systolic murmur that radiates to bilateral carotids severe stenosis noted on echocardiogram.  Patient's mentation is more likely due to anemia and uremia.  ASCVD (arteriosclerotic cardiovascular disease) History of lacunar cerebral infarctions and coronary artery disease.  Holding aspirin  in light of workup for ongoing GI bleeding.  Seizures (HCC) History of seizures.  Takes nightly phenobarbital  and zonisamide  for this.  Will continue this medicine.  CKD (chronic kidney disease) stage 5, GFR less than 15 ml/min (HCC) Stable GFR, still consistent with CKD 5 range.  She does have a mild anion gap acidosis but her potassium is okay.  Will check a phosphorus as well.  A little bit volume overloaded but respiratory status is okay.  I do wonder if the degree of uremia is because of bleeding plus impaired filtration at the level of the kidneys.  I think this could be an explanation for why she feels so tired the last few weeks, but I do not think that there is an indication for urgent dialysis. -Will consider speaking with nephrology tomorrow  Declining functional status Impaired mobility.  Moves around with a walker and sometimes a wheelchair.  Tries to do some light housework to help out her husband but has not been  able to do much of this recently.  I anticipate that this person may need some rehabilitation outside of the hospital, perhaps subacute level rehab.  Will ask PT and OT to see her tomorrow.  Uremia BUN 140 on exam today.  It was as high as 150 in October, which does precede her malaise, some may be that uremia is not as responsible as the anemia.  At any rate I do think it is probably a contributing factor.  As above, no indication for urgent dialysis right now.  Supportive care for itching and nausea should it arise.   Diet: NPO at midnight VTE: SCDs Code: DNR/DNI  Prior to Admission Living Arrangement: Home, living with husband Anticipated Discharge Location: Home Barriers to Discharge: Medical treatment  Dispo: Admit patient to Observation with expected length of stay less than 2 midnights.  Signed:   Damien Kandis HAS  Internal Medicine Resident PGY-1 02/25/2023, 6:41 PM   Please contact the on call pager after 5 pm and on weekends at [phone].

## 2023-02-25 NOTE — Assessment & Plan Note (Addendum)
 Pt's hemoglobin at 4.6. Did not report any blood loss. Suspect this is secondary to her CKD. Will add-on labs including iron  panel, folate, VB12. Less likely IDA given normocytic anemia but could have a combined picture. Will also add on retic count and epo level. Pt may need to be started on EPO given her CKDIV. Less concern for acute blood loss based on hx and regular rate on cardiac auscultation. Will have patient go to the ED for blood transfusion and further work up.

## 2023-02-25 NOTE — Patient Instructions (Addendum)
 [name], it was a pleasure seeing you today! You endorsed feeling well today. Below are some of the things we talked about this visit. We look forward to seeing you in the follow up appointment!  Today we discussed: Your blood pressure looks great. Continue taking your medications.   For your belly pain, I am going to add senna kot, take 2 tablets daily and take 2 more if needed to have a bowel movement.   We would like you to come back in 2-3 weeks to discuss your chronic problems.   Follow up with kidney doctor tomorrow.   I have ordered the following labs today:   Lab Orders         CBC with Diff         CMP14 + Anion Gap       Referrals ordered today:   Referral Orders  No referral(s) requested today     I have ordered the following medication/changed the following medications:   Stop the following medications: There are no discontinued medications.   Start the following medications: No orders of the defined types were placed in this encounter.    Follow-up: 2-3 week follow up   Please make sure to arrive 15 minutes prior to your next appointment. If you arrive late, you may be asked to reschedule.   We look forward to seeing you next time. Please call our clinic at [phone] if you have any questions or concerns. The best time to call is Monday-Friday from 9am-4pm, but there is someone available 24/7. If after hours or the weekend, call the main hospital number and ask for the Internal Medicine Resident On-Call. If you need medication refills, please notify your pharmacy one week in advance and they will send us  a request.  Thank you for letting us  take part in your care. Wishing you the best!  Thank you, Sherry Bathe, MD

## 2023-02-25 NOTE — Hospital Course (Addendum)
 Principal Problem:   Symptomatic anemia Active Problems:   Aortic stenosis   ASCVD (arteriosclerotic cardiovascular disease)   Seizures (HCC)   Melena   CKD (chronic kidney disease) stage 5, GFR less than 15 ml/min (HCC)   Declining functional status   Uremia   Coronary artery disease involving coronary bypass graft of native heart without angina pectoris   Hx of transient ischemic attack (TIA)   Chronic heart failure with preserved ejection fraction (HCC)   Second degree type I atrioventricular block   First degree AV block   Severe aortic stenosis   Preprocedural cardiovascular examination   Pressure injury of sacral region, stage 2 (HCC)   Pericardial effusion  Resolved Problems:   * No resolved hospital problems. *   Acute on Chronic Anemia Patient presented with a hemoglobin of 4.6 which was suspected to be due to  chronic upper GI blood loss with symptoms of 3 weeks of melena.  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  She received 3 units of PRBCs and her hemoglobin remained stable throughout her hospitalization.  Patient also received recommend IV iron  supplementations while hospitalized.  An EGD revealed localized moderately erythematous mucosa without bleeding was found in the prepyloric region. An CT of abdomen/pelvis did not reveal a lesion or inflammation to account for the anemia. A colonoscopy did not show active bleeding or a recent source of bleeding.  Polyps were removed and sent for biopsy.   Severe Aortic Stenosis Systolic murmur that radiates to bilateral carotids severe stenosis noted on echocardiogram. Cardiology was consulted and deemed patient high risk with cardiac history. Her risk factors are unable to modified; GI and family decided to proceed with endoscopy.  Patient was continued on her torsemide  60 mg daily throughout her hospitalization, we instructed her to discontinue Lasix  on discharge. It appears that she was taking  both Lasix  and torsemide  simultaneously.   ASCVD (arteriosclerotic cardiovascular disease) She was continued on her Crestor  40 mg hospitalized.  Her aspirin  was initially held and restarted.  Seizures (HCC) History of seizures.  Takes nightly phenobarbital  and zonisamide  for this.  Continued this home medication.    CKD (chronic kidney disease) stage 5, GFR less than 15 ml/min (HCC) Stable GFR, still consistent with CKD 5 range.  She did not require an urgent need for dialysis during his hospitalization.  We continued her home medications of Oscal and Renvela .  Declining functional status Impaired mobility.  Moves around with a walker and sometimes a wheelchair.  Tries to do some light housework to help out her husband but has not been able to do much of this recently.  Health for physical therapy and Occupational Therapy was ordered on discharge  Pericardial Effusion, moderate CT imaging showed a moderate pericardial effusion.  Patient also had a small-moderate pericardial effusion present on echocardiogram in July.  Patient is volume overloaded with severe aortic stenosis and CKD stage V, pericardial effusion is most likely due to volume overload.     Uremia BUN was 88 on the day of discharge.  Appears that she has chronic uremia and prior labs.  As above, patient did not require emergent dialysis throughout hospitalization.  Incidental image findings: -Punctate calcified granuloma in the central and left lobes of the liver.  -Calcified gallstone within decompressed gallbladder -Bilateral renal cysts are similar to prior

## 2023-02-25 NOTE — ED Notes (Signed)
 Consent e-signed by patient and this RN.

## 2023-02-25 NOTE — Progress Notes (Signed)
 CC: abdominal pain  HPI:  Sherry [name] is a 78 y.o. with medical history of HTN, HLD, Aortic Stenosis, CHF, CKD IV, osteoporosis, hx of CVA  presenting to [name] for complaint of abdominal pain. Pt was last seen in 10/2022 and is overdue for her regular follow up. She occupied her husband to his appointment and complained of abdominal pain so was seen for her complaint.   Please see problem-based list for further details, assessments, and plans.  Past Medical History:  Diagnosis Date   AKI (acute kidney injury) (HCC) 01/31/2020   Anemia    Aortic stenosis    Arthritis    Bilateral lower extremity edema    Bilateral lower extremity edema    Blurry vision, bilateral 05/08/2016   Bradycardia    a. 01/2013 - asymptomatic.   Chronic diastolic heart failure (HCC)    Chronic sinus bradycardia 07/02/2012   CKD (chronic kidney disease)    a. baseline CKD stage III   Coronary artery disease    Electrolyte abnormality 08/30/2022   Epilepsy (HCC) 06/04/2006   EEG 02/19/12  Interpretation:  This is an abnormal EEG demonstrating continuous and generalized slowing of the background in the theta frequency. Irregular rhythm of EKG noted.  Clinical correlation: the generalized slowing is consistent with a mild encephalopathy of nonspecific etiologies for which the differential would include infectious, toxic, metabolic, inflammatory, and vascular etiologies.    GERD (gastroesophageal reflux disease)    Headache(784.0)    Heart block AV second degree 02/15/2013   Heart murmur    Hyperlipidemia    Hypertension    Hypocalcemia 08/27/2022   Patient with chronic hypocalcemia, found to have a critical calcium  of 5.8 on CMP today.  Albumin 2.7, corrected calcium  6.8.  Likely multifactorial due to CKD, vitamin D  deficiency.  This was discussed extensively with the patient and family.  Risks and benefits of inpatient management discussed and family decided to continue with current plan of outpatient  management with close follow-up.  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 -Orbits: Grossly unremarkable. -Paranasal sinuses: Imaged portions clear. -Brain: No acute abnormalities such as hemorrhage, hydrocephalus, acute ischemia, or evidence of a mass lesion.  There is patchy bilateral cerebral white matter T2 and flair signal hyper intensities.  Centimeter or smaller CSF intensity   Mobitz (type) I (Wenckebach's) atrioventricular block    a. 01/2013 - asymptomatic.   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   TIA (transient ischemic attack) 2014   pt stated she had mini strokes   UTI (urinary tract infection) 03/07/2022   Vitamin D  deficiency 04/22/2014    Current Outpatient Medications (Endocrine & Metabolic):    JARDIANCE  10 MG TABS tablet, TAKE 1 TABLET BY MOUTH DAILY  BEFORE BREAKFAST  Current Outpatient Medications (Cardiovascular):    hydrALAZINE  (APRESOLINE ) 100 MG tablet, TAKE 1 TABLET(100 MG) BY MOUTH THREE TIMES DAILY   rosuvastatin  (CRESTOR ) 40 MG tablet, Take 1 tablet (40 mg total) by mouth daily.  torsemide  (DEMADEX ) 20 MG tablet, Take 3 tablets (60 mg total) by mouth daily. TAKE 2 TABLETS BY MOUTH DAILY AND AN EXTRA DOSE(2 TABLETS) IN THE PM FOR SWELLING OR SHORTNESS OF BREATH  Current Outpatient Medications (Respiratory):    albuterol  (PROVENTIL ) (2.5 MG/3ML) 0.083% nebulizer solution, Take 3 mLs (2.5 mg total) by nebulization every 6 (six) hours as needed for  wheezing.  Current Outpatient Medications (Analgesics):    acetaminophen  (TYLENOL ) 325 MG tablet, Take 2 tablets (650 mg total) by mouth every 6 (six) hours as needed for mild pain (or Fever >/= 101).   aspirin  81 MG chewable tablet, Chew 1 tablet (81 mg total) by mouth daily.   febuxostat  (ULORIC ) 40 MG tablet, Take 20 mg by mouth daily.   Current Outpatient Medications (Other):    senna-docusate (SENOKOT S) 8.6-50 MG tablet, Take 2 tablets by mouth daily.   calcium  carbonate (OSCAL) 1500 (600 Ca) MG TABS tablet, Take 1 tablet (1,500 mg total) by mouth 3 (three) times daily with meals.   cholecalciferol  (VITAMIN D3) 25 MCG (1000 UNIT) tablet, Take 1 tablet (1,000 Units total) by mouth daily.   doxycycline  (VIBRAMYCIN ) 100 MG capsule, Take 1 capsule (100 mg total) by mouth 2 (two) times daily.   Hydrocortisone (PREPARATION H EX), Apply 1 Application topically 2 (two) times daily as needed (Hemorrhoids).   lidocaine  (LMX) 4 % cream, Apply 1 Application topically 2 (two) times daily as needed (Pain on sacral wound area).   multivitamin (RENA-VIT) TABS tablet, Take 1 tablet by mouth at bedtime.   pantoprazole  (PROTONIX ) 40 MG tablet, TAKE 1 TABLET BY MOUTH DAILY   PHENobarbital  (LUMINAL) 64.8 MG tablet, Take 2 tablets (129.6 mg total) by mouth at bedtime.   potassium chloride  SA (KLOR-CON  M) 20 MEQ tablet, Take 1 tablet (20 mEq total) by mouth daily.   sevelamer  carbonate (RENVELA ) 800 MG tablet, Take 1 tablet (800 mg total) by mouth 3 (three) times daily with meals.   zonisamide  (ZONEGRAN ) 100 MG capsule, TAKE 2 CAPSULES BY MOUTH AT NIGHT  Review of Systems:  Review of system negative unless stated in the problem list or HPI.    Physical Exam:  Vitals:   02/25/23 1001  BP: 128/71  Pulse: 70  Temp: 98.2 F (36.8 C)  TempSrc: Oral  SpO2: 100%  Height: 5' 1 (1.549 m)   Physical Exam General: NAD HENT: NCAT Lungs: CTAB, no wheeze, rhonchi or rales.  Cardiovascular: normal rate and  rhythm, 4/6 murmur present. Good radial pulse, 2+ LE edema Abdomen: No TTP, diminished bowel sounds, soft abdomen.  MSK: No asymmetry or muscle atrophy.  Skin: no lesions noted on exposed skin Neuro: Alert and oriented x4. CN grossly intact Psych: Normal mood and normal affect   Assessment & Plan:   Abscess Resolved. Pt completed her antibiotics.   Normocytic anemia Pt's hemoglobin at 4.6. Did not report any blood loss. Suspect this is secondary to her CKD. Will add-on labs including iron  panel, folate, VB12. Less likely IDA given normocytic anemia but could have a combined picture. Will also add on retic count and epo level. Pt may need to be started on EPO given her CKDIV. Less concern for acute blood loss based on hx and regular rate on cardiac auscultation. Will have patient go to the ED for blood transfusion and further work up.   Abdominal pain Pt with abdominal pain that is located more in the lower quadrants. This has been going on for 2-3 weeks. States it is  intermittent in nature and describes it as achy. States it is worsened by when she eats and relieved with bowel movement. She reports rectal pain and states it is painful to have a bowel movement. Reports her stool as being very hard. Suspect constipation playing a significant role in her abdominal pain. Will start bowel regimen to help have regular bowel movements. She is currently using mineral oil and states that relieves that pain. Ddx also includes cholecystitis vs biliary colic vs diverticulitis vs pancreatitis vs CMI. Pt pain is intermittent in nature and has no radiation to the back making pancreatitis less likely. Pt has no symptoms of fevers or chills making cholecystitis and diverticulitis less likely. Pt does have significant atherosclerosis and CMI could be contributing but pain would not subside with bowel movement. Given pt's baseline, will work up systematically by getting CBC, and CMP and treating her constipation. If  abnormalities are noted on lab work then will order further studies.   Pt's lab work with significant anemia (see anemia tab) and mild liver enzyme elevation. Will add on RUQ to her work up to look for cause of liver enzyme abnormalities. Given the degree of her anemia, will have pt come to the ED for further work and blood transfusion.    See Encounters Tab for problem based charting.  Patient Discussed with Dr. Lovie Morene Bathe, MD [facility] DEL. [facility], PGY-3

## 2023-02-25 NOTE — Progress Notes (Addendum)
 Lockwood MD notfied of critical hgb 4.7.  RN will continue to monitor.

## 2023-02-25 NOTE — ED Triage Notes (Addendum)
 Report received from [facility].  Pt from home.  States she was seen in the clinic today and received a call to come back to the hospital for a blood transfusion.  Reports generalized weakness x 2 weeks and sleeping a lot.  Denies pain at present.  Hgb 4.6

## 2023-02-25 NOTE — Assessment & Plan Note (Signed)
 Pt with abdominal pain that is located more in the lower quadrants. This has been going on for 2-3 weeks. States it is intermittent in nature and describes it as achy. States it is worsened by when she eats and relieved with bowel movement. She reports rectal pain and states it is painful to have a bowel movement. Reports her stool as being very hard. Suspect constipation playing a significant role in her abdominal pain. Will start bowel regimen to help have regular bowel movements. She is currently using mineral oil and states that relieves that pain. Ddx also includes cholecystitis vs biliary colic vs diverticulitis vs pancreatitis vs CMI. Pt pain is intermittent in nature and has no radiation to the back making pancreatitis less likely. Pt has no symptoms of fevers or chills making cholecystitis and diverticulitis less likely. Pt does have significant atherosclerosis and CMI could be contributing but pain would not subside with bowel movement. Given pt's baseline, will work up systematically by getting CBC, and CMP and treating her constipation. If abnormalities are noted on lab work then will order further studies.   Pt's lab work with significant anemia (see anemia tab) and mild liver enzyme elevation. Will add on RUQ to her work up to look for cause of liver enzyme abnormalities. Given the degree of her anemia, will have pt come to the ED for further work and blood transfusion.

## 2023-02-25 NOTE — ED Notes (Signed)
 Pt currently in no acute distress.  Call bell within reach.

## 2023-02-26 DIAGNOSIS — K3189 Other diseases of stomach and duodenum: Secondary | ICD-10-CM | POA: Diagnosis not present

## 2023-02-26 DIAGNOSIS — I5032 Chronic diastolic (congestive) heart failure: Secondary | ICD-10-CM | POA: Diagnosis not present

## 2023-02-26 DIAGNOSIS — D123 Benign neoplasm of transverse colon: Secondary | ICD-10-CM | POA: Diagnosis not present

## 2023-02-26 DIAGNOSIS — I440 Atrioventricular block, first degree: Secondary | ICD-10-CM

## 2023-02-26 DIAGNOSIS — K31A19 Gastric intestinal metaplasia without dysplasia, unspecified site: Secondary | ICD-10-CM | POA: Diagnosis not present

## 2023-02-26 DIAGNOSIS — K2970 Gastritis, unspecified, without bleeding: Secondary | ICD-10-CM | POA: Diagnosis not present

## 2023-02-26 DIAGNOSIS — Z8673 Personal history of transient ischemic attack (TIA), and cerebral infarction without residual deficits: Secondary | ICD-10-CM | POA: Diagnosis not present

## 2023-02-26 DIAGNOSIS — I110 Hypertensive heart disease with heart failure: Secondary | ICD-10-CM | POA: Diagnosis not present

## 2023-02-26 DIAGNOSIS — Z7401 Bed confinement status: Secondary | ICD-10-CM | POA: Diagnosis not present

## 2023-02-26 DIAGNOSIS — E739 Lactose intolerance, unspecified: Secondary | ICD-10-CM | POA: Diagnosis present

## 2023-02-26 DIAGNOSIS — Z01810 Encounter for preprocedural cardiovascular examination: Secondary | ICD-10-CM

## 2023-02-26 DIAGNOSIS — E785 Hyperlipidemia, unspecified: Secondary | ICD-10-CM | POA: Diagnosis present

## 2023-02-26 DIAGNOSIS — K2951 Unspecified chronic gastritis with bleeding: Secondary | ICD-10-CM | POA: Diagnosis not present

## 2023-02-26 DIAGNOSIS — K644 Residual hemorrhoidal skin tags: Secondary | ICD-10-CM | POA: Diagnosis not present

## 2023-02-26 DIAGNOSIS — I5022 Chronic systolic (congestive) heart failure: Secondary | ICD-10-CM | POA: Diagnosis not present

## 2023-02-26 DIAGNOSIS — I25810 Atherosclerosis of coronary artery bypass graft(s) without angina pectoris: Secondary | ICD-10-CM

## 2023-02-26 DIAGNOSIS — K8020 Calculus of gallbladder without cholecystitis without obstruction: Secondary | ICD-10-CM | POA: Diagnosis not present

## 2023-02-26 DIAGNOSIS — I350 Nonrheumatic aortic (valve) stenosis: Secondary | ICD-10-CM

## 2023-02-26 DIAGNOSIS — R58 Hemorrhage, not elsewhere classified: Secondary | ICD-10-CM | POA: Diagnosis not present

## 2023-02-26 DIAGNOSIS — I3139 Other pericardial effusion (noninflammatory): Secondary | ICD-10-CM | POA: Diagnosis present

## 2023-02-26 DIAGNOSIS — D122 Benign neoplasm of ascending colon: Secondary | ICD-10-CM | POA: Diagnosis not present

## 2023-02-26 DIAGNOSIS — L89152 Pressure ulcer of sacral region, stage 2: Secondary | ICD-10-CM | POA: Diagnosis present

## 2023-02-26 DIAGNOSIS — D62 Acute posthemorrhagic anemia: Secondary | ICD-10-CM | POA: Diagnosis not present

## 2023-02-26 DIAGNOSIS — I132 Hypertensive heart and chronic kidney disease with heart failure and with stage 5 chronic kidney disease, or end stage renal disease: Secondary | ICD-10-CM | POA: Diagnosis present

## 2023-02-26 DIAGNOSIS — K5900 Constipation, unspecified: Secondary | ICD-10-CM | POA: Diagnosis present

## 2023-02-26 DIAGNOSIS — I441 Atrioventricular block, second degree: Secondary | ICD-10-CM

## 2023-02-26 DIAGNOSIS — I2510 Atherosclerotic heart disease of native coronary artery without angina pectoris: Secondary | ICD-10-CM | POA: Diagnosis not present

## 2023-02-26 DIAGNOSIS — I129 Hypertensive chronic kidney disease with stage 1 through stage 4 chronic kidney disease, or unspecified chronic kidney disease: Secondary | ICD-10-CM | POA: Diagnosis not present

## 2023-02-26 DIAGNOSIS — N185 Chronic kidney disease, stage 5: Secondary | ICD-10-CM | POA: Diagnosis not present

## 2023-02-26 DIAGNOSIS — N189 Chronic kidney disease, unspecified: Secondary | ICD-10-CM | POA: Diagnosis not present

## 2023-02-26 DIAGNOSIS — K921 Melena: Secondary | ICD-10-CM | POA: Diagnosis not present

## 2023-02-26 DIAGNOSIS — Z87891 Personal history of nicotine dependence: Secondary | ICD-10-CM | POA: Diagnosis not present

## 2023-02-26 DIAGNOSIS — D649 Anemia, unspecified: Secondary | ICD-10-CM

## 2023-02-26 DIAGNOSIS — Z66 Do not resuscitate: Secondary | ICD-10-CM | POA: Diagnosis present

## 2023-02-26 DIAGNOSIS — R531 Weakness: Secondary | ICD-10-CM | POA: Diagnosis not present

## 2023-02-26 DIAGNOSIS — K648 Other hemorrhoids: Secondary | ICD-10-CM | POA: Diagnosis not present

## 2023-02-26 DIAGNOSIS — D509 Iron deficiency anemia, unspecified: Secondary | ICD-10-CM | POA: Diagnosis not present

## 2023-02-26 DIAGNOSIS — E872 Acidosis, unspecified: Secondary | ICD-10-CM | POA: Diagnosis present

## 2023-02-26 DIAGNOSIS — D61818 Other pancytopenia: Secondary | ICD-10-CM | POA: Diagnosis present

## 2023-02-26 LAB — BPAM RBC
Blood Product Expiration Date: 202501292359
Blood Product Expiration Date: 202501302359
Blood Product Expiration Date: 202501302359
ISSUE DATE / TIME: 202501071551
ISSUE DATE / TIME: 202501072001
ISSUE DATE / TIME: 202501072251
Unit Type and Rh: 5100
Unit Type and Rh: 5100
Unit Type and Rh: 5100

## 2023-02-26 LAB — CBC
HCT: 24.4 % — ABNORMAL LOW (ref 36.0–46.0)
Hemoglobin: 8.2 g/dL — ABNORMAL LOW (ref 12.0–15.0)
MCH: 28.2 pg (ref 26.0–34.0)
MCHC: 33.6 g/dL (ref 30.0–36.0)
MCV: 83.8 fL (ref 80.0–100.0)
Platelets: 155 10*3/uL (ref 150–400)
RBC: 2.91 MIL/uL — ABNORMAL LOW (ref 3.87–5.11)
RDW: 16.1 % — ABNORMAL HIGH (ref 11.5–15.5)
WBC: 2.3 10*3/uL — ABNORMAL LOW (ref 4.0–10.5)
nRBC: 0.9 % — ABNORMAL HIGH (ref 0.0–0.2)

## 2023-02-26 LAB — TYPE AND SCREEN
ABO/RH(D): O POS
Antibody Screen: NEGATIVE
Unit division: 0
Unit division: 0
Unit division: 0

## 2023-02-26 LAB — BASIC METABOLIC PANEL
Anion gap: 17 — ABNORMAL HIGH (ref 5–15)
BUN: 142 mg/dL — ABNORMAL HIGH (ref 8–23)
CO2: 20 mmol/L — ABNORMAL LOW (ref 22–32)
Calcium: 7.4 mg/dL — ABNORMAL LOW (ref 8.9–10.3)
Chloride: 103 mmol/L (ref 98–111)
Creatinine, Ser: 3.43 mg/dL — ABNORMAL HIGH (ref 0.44–1.00)
GFR, Estimated: 13 mL/min — ABNORMAL LOW (ref >60)
Glucose, Bld: 82 mg/dL (ref 70–99)
Potassium: 3.5 mmol/L (ref 3.5–5.1)
Sodium: 140 mmol/L (ref 135–145)

## 2023-02-26 LAB — MAGNESIUM: Magnesium: 2.9 mg/dL — ABNORMAL HIGH (ref 1.7–2.4)

## 2023-02-26 LAB — PHOSPHORUS: Phosphorus: 7 mg/dL — ABNORMAL HIGH (ref 2.5–4.6)

## 2023-02-26 MED ORDER — ZONISAMIDE 100 MG PO CAPS
200.0000 mg | ORAL_CAPSULE | Freq: Every day | ORAL | Status: DC
  Administered 2023-02-26 – 2023-03-01 (×4): 200 mg via ORAL
  Filled 2023-02-26 (×5): qty 2

## 2023-02-26 MED ORDER — IRON SUCROSE 200 MG IVPB - SIMPLE MED
200.0000 mg | Status: DC
  Administered 2023-02-26: 200 mg via INTRAVENOUS
  Filled 2023-02-26: qty 110
  Filled 2023-02-26: qty 200

## 2023-02-26 NOTE — Progress Notes (Signed)
 Internal Medicine Clinic Attending  Case discussed with the resident at the time of the visit.  We reviewed the resident's history and exam and pertinent patient test results.  I agree with the assessment, diagnosis, and plan of care documented in the resident's note.   Agree with plan for urgent admission for new finding of acute anemia, likely secondary to GI blood loss, but needs to be worked up further.

## 2023-02-26 NOTE — Consult Note (Addendum)
 Cardiology Consultation   Patient ID: Sherry [name] MRN: [id number]; DOB: 27-Oct-1945  Admit date: 02/25/2023 Date of Consult: 02/26/2023  PCP:  Sherry Prost, MD   [facility] Providers Cardiologist:  Sherry Fell, MD        Patient Profile:   Sherry [name] is a 78 y.o. female with a hx of aortic stenosis, chronic diastolic CHF, CAD, CKD V, chronic anemia, seizure disorder who is being seen 02/26/2023 for the evaluation of pre-operative risk stratification at the request of Dr. Burnette with GI.  History of Present Illness:   [name] was seen by CHF team in December. Based on 08/31/2022 echo she has severe arctic stenosis with preserved EF. She had coronary angiography in 05/2013 with rather diffuse moderate-severe coronary disease that was managed medically. Cardiac MRI in 11/19 was concerning for cardiac amyloidosis. However, PYP scan was negative in 11/19. Workup at that time also did not show a monoclonal protein to suggest AL amyloidosis.   Echo in 11/22 showed EF 60-65%, severe LVH, normal RV size and systolic function, mild MR, paradoxical low flow/low gradient severe aortic stenosis with mean gradient 24 mmHg and AVA 0.88 cm^2, small to moderate pericardial effusion.  PYP scan was repeated in 11/22, grade 1 with H/CL 1.07.  This is unlikely to be TTR cardiac amyloidosis.  Repeat urine immunofixation and myeloma panel were negative as well.  [employer]ae gene testing negative for hATTR mutation.    She was evaluated by hematology for pancytopenia.  This improved without intervention.      Echo in 9/23 showed EF 60-65%, severe concentric LVH, normal RV, PASP 43, moderate pericardial effusion without tamponade, mild-moderate MR, moderate mitral stenosis with mean gradient 7 mmHg, severe low flow/low gradient AS mean gradient 30 with AVA 0.95 cm^2, IVC normal.    Follow up 10/23, NYHA II-III and mildly volume overloaded, Lasix  increased to 60 daily.    Admitted  7/24 with a/c HF. Echo showed AS severe. Structural Team consulted and felt not TAVR candidate. Diuresed with IV lasix  and GDMT titrated, limited by kidney function. She was seen by PMT and code updated to DNR, with plans for palliative care outpatient.  Patient presented to internal medicine clinic on the morning of 02/25/2023 for evaluation of abdominal pain. CBC obtained and found HGB 4.6 so patient was sent to the ED. Patient has had 3 weeks of abdominal pain and suspected melena. Over a similar time frame, she has also had dyspnea and increased fatigue. She received PRBC transfusion with improvement in symptoms. GI consulted for endoscopy. Given her severe aortic stenosis, cardiology asked to see patient for risk stratification.   Patient repo will be ies any recent ch.  est pain, palpitations, LE edema.    Past Medical History:  Diagnosis Date   AKI (acute kidney injury) (HCC) 01/31/2020   Anemia    Aortic stenosis    Arthritis    Bilateral lower extremity edema    Bilateral lower extremity edema    Blurry vision, bilateral 05/08/2016   Bradycardia    a. 01/2013 - asymptomatic.   Chronic diastolic heart failure (HCC)    Chronic sinus bradycardia 07/02/2012   CKD (chronic kidney disease)    a. baseline CKD stage III   Coronary artery disease    Electrolyte abnormality 08/30/2022   Epilepsy (HCC) 06/04/2006   EEG 02/19/12  Interpretation:  This is an abnormal EEG demonstrating continuous and generalized slowing of the background in the theta frequency. Irregular rhythm  of EKG noted.  Clinical correlation: the generalized slowing is consistent with a mild encephalopathy of nonspecific etiologies for which the differential would include infectious, toxic, metabolic, inflammatory, and vascular etiologies.    GERD (gastroesophageal reflux disease)    Headache(784.0)    Heart block AV second degree 02/15/2013   Heart murmur    Hyperlipidemia    Hypertension    Hypocalcemia 08/27/2022    Patient with chronic hypocalcemia, found to have a critical calcium  of 5.8 on CMP today.  Albumin 2.7, corrected calcium  6.8.  Likely multifactorial due to CKD, vitamin D  deficiency.  This was discussed extensively with the patient and family.  Risks and benefits of inpatient management discussed and family decided to continue with current plan of outpatient management with close follow-up.  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 -Orbits: Grossly unremarkable. -Paranasal sinuses: Imaged portions clear. -Brain: No acute abnormalities such as hemorrhage, hydrocephalus, acute ischemia, or evidence of a mass lesion.  There is patchy bilateral cerebral white matter T2 and flair signal hyper intensities.  Centimeter or smaller CSF intensity   Mobitz (type) I (Wenckebach's) atrioventricular block    a. 01/2013 - asymptomatic.   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   TIA (transient ischemic attack) 2014   pt stated she had mini strokes   UTI (urinary tract infection) 03/07/2022   Vitamin D  deficiency 04/22/2014    Past Surgical History:  Procedure Laterality Date   ABDOMINAL HYSTERECTOMY     LEFT AND RIGHT HEART CATHETERIZATION WITH CORONARY ANGIOGRAM N/A 05/24/2013   Procedure: LEFT AND RIGHT HEART CATHETERIZATION WITH CORONARY ANGIOGRAM;  Surgeon: Victory LELON Claudene DOUGLAS, MD;  Location: [facility] CATH LAB;  Service: Cardiovascular;  Laterality: N/A;   LEFT HEART CATH N/A 02/01/2020   Procedure: Left  Heart Cath;  Surgeon: Cherrie Toribio SAUNDERS, MD;  Location: [facility] INVASIVE CV LAB;  Service: Cardiovascular;  Laterality: N/A;   RIGHT HEART CATH N/A 02/01/2020   Procedure: RIGHT HEART CATH;  Surgeon: Cherrie Toribio SAUNDERS, MD;  Location: [facility] INVASIVE CV LAB;  Service: Cardiovascular;  Laterality: N/A;     Home Medications:  Prior to Admission medications   Medication Sig Start Date End Date Taking? Authorizing Provider  acetaminophen  (TYLENOL ) 325 MG tablet Take 2 tablets (650 mg total) by mouth every 6 (six) hours as needed for mild pain (or Fever >/= 101). 11/21/22  Yes Jolaine Pac, DO  albuterol  (PROVENTIL ) (2.5 MG/3ML) 0.083% nebulizer solution Take 3 mLs (2.5 mg total) by nebulization every 6 (six) hours as needed for wheezing. 09/10/22  Yes Elnora Ip, MD  aspirin  81 MG chewable tablet Chew 1 tablet (81 mg total) by mouth daily. 09/11/22  Yes Elnora Ip, MD  calcium  carbonate (OSCAL) 1500 (600 Ca) MG TABS tablet Take 1 tablet (1,500 mg total) by mouth 3 (three) times daily with meals. 10/14/22  Yes Sherry Prost, MD  cholecalciferol  (VITAMIN D3) 25 MCG (1000 UNIT) tablet Take 1 tablet (1,000 Units total) by mouth daily. 11/21/22 11/21/23  Yes Jolaine Pac, DO  febuxostat  (ULORIC ) 40 MG tablet Take 20 mg by mouth daily. 08/07/22  Yes [provider]  furosemide  (LASIX ) 20 MG tablet Take 20 mg by mouth 3 (three) times daily. 01/04/23  Yes [provider]  hydrALAZINE  (APRESOLINE ) 100 MG tablet TAKE 1 TABLET(100 MG) BY MOUTH THREE TIMES DAILY 02/04/23 02/04/24 Yes Sherry Prost, MD  JARDIANCE  10 MG TABS tablet TAKE 1 TABLET BY MOUTH DAILY  BEFORE BREAKFAST 12/27/22  Yes Sherry Prost, MD  lidocaine  (LMX) 4 % cream Apply 1 Application topically 2 (two) times daily as needed (Pain on sacral wound area). 09/10/22  Yes Elnora Ip, MD  multivitamin (RENA-VIT) TABS tablet Take 1 tablet by mouth at bedtime. 09/10/22  Yes Elnora Ip, MD  pantoprazole   (PROTONIX ) 40 MG tablet TAKE 1 TABLET BY MOUTH DAILY 11/22/22  Yes Sherry Prost, MD  PHENobarbital  (LUMINAL) 64.8 MG tablet Take 2 tablets (129.6 mg total) by mouth at bedtime. 02/07/23  Yes Georjean Darice HERO, MD  potassium chloride  SA (KLOR-CON  M) 20 MEQ tablet Take 1 tablet (20 mEq total) by mouth daily. 02/03/23  Yes [place], Harlene HERO, FNP  rosuvastatin  (CRESTOR ) 40 MG tablet Take 1 tablet (40 mg total) by mouth daily. 01/14/22 02/03/24 Yes [place], Harlene HERO, FNP  senna-docusate (SENOKOT S) 8.6-50 MG tablet Take 2 tablets by mouth daily. 02/25/23 05/26/23 Yes Sherry Prost, MD  sevelamer  carbonate (RENVELA ) 800 MG tablet Take 1 tablet (800 mg total) by mouth 3 (three) times daily with meals. 11/21/22 11/21/23 Yes Jolaine Pac, DO  torsemide  (DEMADEX ) 20 MG tablet Take 3 tablets (60 mg total) by mouth daily. TAKE 2 TABLETS BY MOUTH DAILY AND AN EXTRA DOSE(2 TABLETS) IN THE PM FOR SWELLING OR SHORTNESS OF BREATH 02/06/23  Yes [place], Harlene HERO, FNP  zonisamide  (ZONEGRAN ) 100 MG capsule TAKE 2 CAPSULES BY MOUTH AT NIGHT 10/02/22  Yes Georjean Darice HERO, MD    Inpatient Medications: Scheduled Meds:  sodium chloride    Intravenous Once   calcium  carbonate  1 tablet Oral TID WC   cholecalciferol   1,000 Units Oral Daily   febuxostat   20 mg Oral Daily   pantoprazole  (PROTONIX ) IV  40 mg Intravenous Q12H   PHENobarbital   129.6 mg Oral QHS   rosuvastatin   40 mg Oral Daily   sevelamer  carbonate  800 mg Oral TID WC   torsemide   60 mg Oral Daily   zonisamide   200 mg Oral QHS   Continuous Infusions:  iron  sucrose 200 mg (02/26/23 1259)   PRN Meds:   Allergies:    Allergies  Allergen Reactions   Ibuprofen Other (See Comments)    Dr advised pt not to take ibuprofen   Lactose Intolerance (Gi) Diarrhea    Social History:   Social History   Socioeconomic History   Marital status: Married    Spouse name: Johnny   Number of children: 2   Years of education: 4   Highest education level: Not on file   Occupational History   Occupation: retired  Tobacco Use   Smoking status: Former    Current packs/day: 0.00    Average packs/day: 0.5 packs/day for 10.0 years (5.0 ttl pk-yrs)    Types: Cigarettes    Start date: 06/04/1990    Quit date: 06/03/2000    Years since quitting: 22.7   Smokeless tobacco: Never  Vaping Use   Vaping status: Never Used  Substance and Sexual Activity   Alcohol use: No    Alcohol/week: 0.0 standard drinks  of alcohol   Drug use: No   Sexual activity: Not Currently    Partners: Male  Other Topics Concern   Not on file  Social History Narrative   Current Social History 06/20/2020        Patient lives with spouse in a home which is 1 story. There are not steps up to the entrance, the patient uses a ramp.       Patient's method of transportation is [employer].      The highest level of education was some high school.      The patient currently disabled.      Identified important Relationships are God and my husband       Pets : None       Interests / Fun: Sitting on my front porch       Current Stressors: None        Son passed away.      Social Drivers of Corporate Investment Banker Strain: Low Risk  (09/18/2022)   Overall Financial Resource Strain (CARDIA)    Difficulty of Paying Living Expenses: Not hard at all  Food Insecurity: No Food Insecurity (02/25/2023)   Hunger Vital Sign    Worried About Running Out of Food in the Last Year: Never true    Ran Out of Food in the Last Year: Never true  Transportation Needs: No Transportation Needs (02/25/2023)   PRAPARE - Administrator, Civil Service (Medical): No    Lack of Transportation (Non-Medical): No  Physical Activity: Inactive (09/18/2022)   Exercise Vital Sign    Days of Exercise per Week: 0 days    Minutes of Exercise per Session: 0 min  Stress: No Stress Concern Present (09/18/2022)   [employer] of Occupational Health - Occupational Stress Questionnaire    Feeling of Stress :  Not at all  Social Connections: Moderately Isolated (02/25/2023)   Social Connection and Isolation Panel [NHANES]    Frequency of Communication with Friends and Family: More than three times a week    Frequency of Social Gatherings with Friends and Family: Twice a week    Attends Religious Services: Never    Database Administrator or Organizations: No    Attends Banker Meetings: Never    Marital Status: Married  Catering Manager Violence: Not At Risk (02/25/2023)   Humiliation, Afraid, Rape, and Kick questionnaire    Fear of Current or Ex-Partner: No    Emotionally Abused: No    Physically Abused: No    Sexually Abused: No    Family History:    Family History  Problem Relation Age of Onset   Hypertension Other    Heart disease Mother    Hypertension Mother    Cancer Father    Obesity Son    Asthma Sister    Heart disease Sister    Heart disease Sister      ROS:  Please see the history of present illness.   All other ROS reviewed and negative.     Physical Exam/Data:   Vitals:   02/26/23 0229 02/26/23 0427 02/26/23 0757 02/26/23 1645  BP: (!) 145/78 (!) 143/69 137/75 (!) 141/69  Pulse: 60 66 65 64  Resp: 18 18 18 18   Temp: 97.9 F (36.6 C) (!) 97.5 F (36.4 C) 97.8 F (36.6 C) 98 F (36.7 C)  TempSrc: Oral Oral Oral Oral  SpO2: 100% 100% 100% 100%    Intake/Output Summary (Last 24 hours)  at 02/26/2023 2015 Last data filed at 02/26/2023 0229 Gross per 24 hour  Intake 1103.5 ml  Output --  Net 1103.5 ml      02/03/2023   12:09 PM 11/11/2022    2:00 PM 10/29/2022   10:08 AM  Last 3 Weights  Weight (lbs) -- 170 lb --  Weight (kg) -- 77.111 kg --     There is no height or weight on file to calculate BMI.  General:  Well nourished, well developed, in no acute distress HEENT: normal Neck: no JVD Vascular: No carotid bruits; Distal pulses 2+ bilaterally Cardiac:  normal S1, S2; regular rhythm. 4/6 systolic murmur at RUSB. Lungs:  clear to  auscultation  bilaterally, no wheezing, rhonchi or rales. Poor inspiratory effort.  Abd: soft, nontender, no hepatomegaly  Ext: no edema Musculoskeletal:  No deformities, BUE and BLE strength normal and equal Skin: warm and dry  Neuro:  CNs 2-12 intact, no focal abnormalities noted Psych:  Normal affect   EKG:  The EKG was personally reviewed and demonstrates:  sinus arrhythmia with very long PR interval/first degree AVB Telemetry:  Telemetry was personally reviewed and demonstrates:  not on telemetry.  Relevant CV Studies:  08/31/22 TTE  IMPRESSIONS     1. Left ventricular ejection fraction, by estimation, is 60 to 65%. The  left ventricle has normal function. The left ventricle has no regional  wall motion abnormalities. There is severe concentric left ventricular  hypertrophy. Indeterminate diastolic  filling due to E-A fusion.   2. Right ventricular systolic function is normal. The right ventricular  size is normal. Tricuspid regurgitation signal is inadequate for assessing  PA pressure.   3. Left atrial size was moderately dilated.   4. A small to moderate pericardial effusion is present. The pericardial  effusion is circumferential. Large pleural effusion in the left lateral  region.   5. The mitral valve is abnormal. Trivial mitral valve regurgitation. No  evidence of mitral stenosis. Severe mitral annular calcification.   6. The aortic valve is tricuspid. There is severe calcifcation of the  aortic valve. There is severe thickening of the aortic valve. Aortic valve  regurgitation is mild. Severe aortic valve stenosis. Aortic valve area, by  VTI measures 0.62 cm. Aortic  valve mean gradient measures 50.0 mmHg. Aortic valve Vmax measures 4.56  m/s.   7. The inferior vena cava is dilated in size with >50% respiratory  variability, suggesting right atrial pressure of 8 mmHg.   Comparison(s): Changes from prior study are noted. Severe AS now. Speckled  appearance of  myocardium and severe AS suggestive of Amyloid but prior PYP  scans are negative.   FINDINGS   Left Ventricle: Left ventricular ejection fraction, by estimation, is 60  to 65%. The left ventricle has normal function. The left ventricle has no  regional wall motion abnormalities. The left ventricular internal cavity  size was normal in size. There is   severe concentric left ventricular hypertrophy. Indeterminate diastolic  filling due to E-A fusion.   Right Ventricle: The right ventricular size is normal. No increase in  right ventricular wall thickness. Right ventricular systolic function is  normal. Tricuspid regurgitation signal is inadequate for assessing PA  pressure.   Left Atrium: Left atrial size was moderately dilated.   Right Atrium: Right atrial size was normal in size.   Pericardium: A small pericardial effusion is present. The pericardial  effusion is circumferential.   Mitral Valve: The mitral valve is abnormal. Severe mitral annular  calcification. Trivial mitral valve regurgitation. No evidence of mitral  valve stenosis.   Tricuspid Valve: The tricuspid valve is normal in structure. Tricuspid  valve regurgitation is trivial. No evidence of tricuspid stenosis.   Aortic Valve: The aortic valve is tricuspid. There is severe calcifcation  of the aortic valve. There is severe thickening of the aortic valve.  Aortic valve regurgitation is mild. Severe aortic stenosis is present.  Aortic valve mean gradient measures 50.0   mmHg. Aortic valve peak gradient measures 83.2 mmHg. Aortic valve area,  by VTI measures 0.62 cm.   Pulmonic Valve: The pulmonic valve was normal in structure. Pulmonic valve  regurgitation is trivial. No evidence of pulmonic stenosis.   Aorta: The aortic root and ascending aorta are structurally normal, with  no evidence of dilitation.   Venous: The inferior vena cava is dilated in size with greater than 50%  respiratory variability,  suggesting right atrial pressure of 8 mmHg.   IAS/Shunts: No atrial level shunt detected by color flow Doppler.   Additional Comments: There is a large pleural effusion in the left lateral  region.    Laboratory Data:  High Sensitivity Troponin:  No results for input(s): TROPONINIHS in the last 720 hours.   Chemistry Recent Labs  Lab 02/25/23 1059 02/25/23 1424 02/26/23 0510  NA 137 137 140  K 3.5 3.5 3.5  CL 99 101 103  CO2 20* 19* 20*  GLUCOSE 111* 149* 82  BUN 138* 140* 142*  CREATININE 3.55* 3.64* 3.43*  CALCIUM  7.9* 7.8* 7.4*  MG  --   --  2.9*  GFRNONAA 13* 12* 13*  ANIONGAP 18* 17* 17*    Recent Labs  Lab 02/25/23 1059 02/25/23 1424  PROT 6.5 6.5  ALBUMIN 3.3* 3.3*  AST 56* 62*  ALT 60* 61*  ALKPHOS 131* 125  BILITOT 0.6 0.5   Lipids No results for input(s): CHOL, TRIG, HDL, LABVLDL, LDLCALC, CHOLHDL in the last 168 hours.  Hematology Recent Labs  Lab 02/25/23 1059 02/25/23 1424 02/26/23 0510  WBC 2.3* 2.5* 2.3*  RBC 1.77* 1.80*  1.77* 2.91*  HGB 4.6* 4.7* 8.2*  HCT 15.5* 15.6* 24.4*  MCV 87.6 86.7 83.8  MCH 26.0 26.1 28.2  MCHC 29.7* 30.1 33.6  RDW 17.8* 17.7* 16.1*  PLT 185 175 155   Thyroid  No results for input(s): TSH, FREET4 in the last 168 hours.  BNPNo results for input(s): BNP, PROBNP in the last 168 hours.  DDimer No results for input(s): DDIMER in the last 168 hours.   Radiology/Studies:  No results found.   Assessment and Plan:   Cardiac risk stratification  Patient with acute anemia, HBG down to 4.6 is pending EGD with GI. Given below co-morbidities, cardiology asked to evaluate patient's procedural risk. Patient has limited functional status at baseline even prior to onset of symptoms with acute anemia. Due to her poor functional capacity at baseline not sure if can carry out 4.0 METS of activity. Stress test in this setting is not ideal as anemia can result in a false positive study.  Given hx CAD,  CHF, CKD, her perioperative risk of major cardiac events is estimated at 11%. Despite this elevated risk, there are no cardiac inventions that would lower it. Patient not a candidate for aortic valve replacement.   In summary, high risk patient despite relatively low risk EGD procedure. Final decision to proceed with EGD deferred to GI team.    Aortic stenosis, severe  Has had severe aortic stenosis for  several years and has done well. In July 2024 echo noted severe AS, VTI 0.62cm2, mean gradient 50.71mmHg. She has previously been seen by our structural team and Dr. Wonda, felt to not be a candidate for TAVR due to non-ambulatory status, advanced CKD, hx AVB.   Coronary artery disease  Last LHC in April 2015 noted obstructive CAD and recommended to treat medically. Per EMR not a CABG candidate duet o lack of mobility.  Percutaneous intervention also not pursued due to advanced CKD/desire to avoid dialysis. No recent chest pain reported despite severe anemia.   Chronic HFpEF  November 2019 CMR was concerning for cardiac amyloid especially in the setting of moderate LVH and AS. PYP scan negative along with myeloma workup. Repeat PYP November 2022 negative and no transthyretin gene mutation on genetic screening. Latest echo from July 2024 showed EF 60-65%, severe LVH, normal RV, stable small to moderate pericardial effusion, severe AS with mean gradient 50 mmHg as noted above.   Patient appears well compensated/euvolemic on exam. NYHA class III symptoms (in the setting of acute anemia). NYHA class II symptoms before.  Continue Jardiance  10mg  when not pending procedure Continue Torsemide  60mg  daily.  Unable to add further GDMT such as ACEi/ARB/ARNI with CKD V No beta blocker due to baseline first degree AVB.   Hypertension  BP moderately elevated this admission. With significant AS, recommend avoiding hypotension.   First degree AVB Prolonged PR interval. Avoid AV nodal agents. No syncope.  Remains asymptomatic.   Risk Assessment/Risk Scores:     New [place]  Heart Association (NYHA) Functional Class NYHA Class II (at baseline) and III (in the setting of Hb 4.6g/dL)  For questions or updates, please contact [facility] Please consult [email or web address] for contact info under    Signed, Artist Pouch, PA-C  02/26/2023 4:26 PM  ADDENDUM:   Patient seen and examined with APP, Artist Pouch..  I personally taken a history, examined the patient, reviewed relevant notes,  laboratory data / imaging studies.  I performed a substantive portion of this encounter and formulated the important aspects of the plan.  I agree with the APP's note, impression, and recommendations; however, I have edited the note to reflect changes or salient points.  Patient seen and examined at bedside.   Husband present. Denies anginal chest pain or heart failure symptoms. On arrival patient was noted to have hemoglobin of 4.6 g/dL but did not have any associated chest pain at rest.  Overall functional capacity is limited as she is wheelchair-bound.  She has known history of CAD which is being managed medically, known history of aortic stenosis and not a candidate for TAVR.  PHYSICAL EXAM: Today's Vitals   02/26/23 0229 02/26/23 0427 02/26/23 0757 02/26/23 1645  BP: (!) 145/78 (!) 143/69 137/75 (!) 141/69  Pulse: 60 66 65 64  Resp: 18 18 18 18   Temp: 97.9 F (36.6 C) (!) 97.5 F (36.4 C) 97.8 F (36.6 C) 98 F (36.7 C)  TempSrc: Oral Oral Oral Oral  SpO2: 100% 100% 100% 100%  PainSc:       There is no height or weight on file to calculate BMI.   Net IO Since Admission: 1,453.5 mL [02/26/23 2015]  There were no vitals filed for this visit.  Physical Exam  Constitutional: No distress. She appears chronically ill.  hemodynamically stable  Neck: No JVD present.  Cardiovascular: Normal rate, regular rhythm, S1 normal and S2 normal. Exam reveals no gallop, no S3 and no S4.  Murmur  heard. Harsh midsystolic murmur is present with a grade of 3/6 at the upper right sternal border radiating to the neck. Pulses:      Carotid pulses are  on the right side with bruit and  on the left side with bruit. Pulmonary/Chest: Effort normal and breath sounds normal. No stridor. She has no wheezes. She has no rales.  Abdominal: Soft. Bowel sounds are normal. She exhibits no distension. There is no abdominal tenderness.  Musculoskeletal:        General: No edema.     Cervical back: Neck supple.  Neurological: She is alert and oriented to person, place, and time. She has intact cranial nerves (2-12).  Skin: Skin is warm.   EKG: (personally reviewed by me) February 25, 2023: Sinus rhythm, 73 bpm, prolonged first-degree AV block, rare PVCs without underlying injury pattern.  When compared to prior tracing 02/03/2023 Orest Elders physiology is not present.  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  Unable to quantify symptoms with exertion as she is predominantly wheelchair-bound.  Patient has known history of severe aortic stenosis which is currently being followed by Dr. Wonda. Due to her very poor functional capacity with essentially nonambulatory status, advanced kidney disease, history of AV block, and other comorbidities, she is not felt to be a candidate for TAVR.    She was diagnosed with coronary  disease back in 2015 and since then has been treated medically.  Cardiology was asked to weigh in on her preprocedural cardiovascular risk.  Patient is optimized from a cardiovascular standpoint at this time as she is not having any anginal discomfort, no heart failure exacerbation, and she is tolerating her known severe arctic stenosis for the last several years.  Her overall function capacity is limited as she is wheelchair-bound or uses a motorized vehicle.  Though she is optimized from a cardiovascular standpoint given her chronic comorbid conditions which include but not limited to severe aortic stenosis, CAD, CKD, history of TIA, known history of first-degree AV block and secondary type I AV block she is considered to be high risk.  But the risk is not modifiable. Patient and husband find preoperative risk assessment acceptable given her comorbid conditions.  If the surgery/procedure is deemed emergent no risk stratification is needed.  Final decision to proceed forward with endoscopy will be deferred to the procedurlist and the patient/husband once the risks, benefits, alternatives have been discussed.  Cardiology is available if needed.    Further recommendations to follow as the case evolves.   This note was created using a voice recognition software as a result there may be grammatical errors inadvertently enclosed that do not reflect the nature of this encounter. Every attempt is made to correct such errors.   Madonna Large, DO, [facility]  [address] [place], [place] [address] Pager: [phone] Office: [phone] 02/26/2023 8:15 PM

## 2023-02-26 NOTE — Consult Note (Signed)
 [facility] Gastroenterology Consultation Note  Referring Provider: Internal Medicine teaching service Primary Care Physician:  Fernand Prost, MD Primary Gastroenterologist:  Dr. Rosalie  Reason for Consultation:  melena  HPI: Sherry [name] is a 78 y.o. female here for melena and weakness for several weeks.  Multiple medical problems.  No hematemesis or hematochezia.  Had colonoscopy 2008 with Dr. Rosalie but has not been seen in [place] (GI or otherwise) since then as far as I can tell.    No abdominal pain.   Past Medical History:  Diagnosis Date   AKI (acute kidney injury) (HCC) 01/31/2020   Anemia    Aortic stenosis    Arthritis    Bilateral lower extremity edema    Bilateral lower extremity edema    Blurry vision, bilateral 05/08/2016   Bradycardia    a. 01/2013 - asymptomatic.   Chronic diastolic heart failure (HCC)    Chronic sinus bradycardia 07/02/2012   CKD (chronic kidney disease)    a. baseline CKD stage III   Coronary artery disease    Electrolyte abnormality 08/30/2022   Epilepsy (HCC) 06/04/2006   EEG 02/19/12  Interpretation:  This is an abnormal EEG demonstrating continuous and generalized slowing of the background in the theta frequency. Irregular rhythm of EKG noted.  Clinical correlation: the generalized slowing is consistent with a mild encephalopathy of nonspecific etiologies for which the differential would include infectious, toxic, metabolic, inflammatory, and vascular etiologies.    GERD (gastroesophageal reflux disease)    Headache(784.0)    Heart block AV second degree 02/15/2013   Heart murmur    Hyperlipidemia    Hypertension    Hypocalcemia 08/27/2022   Patient with chronic hypocalcemia, found to have a critical calcium  of 5.8 on CMP today.  Albumin 2.7, corrected calcium  6.8.  Likely multifactorial due to CKD, vitamin D  deficiency.  This was discussed extensively with the patient and family.  Risks and benefits of inpatient management discussed and family  decided to continue with current plan of outpatient management with close follow-up.  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 -Orbits: Grossly unremarkable. -Paranasal sinuses: Imaged portions clear. -Brain: No acute abnormalities such as hemorrhage, hydrocephalus, acute ischemia, or evidence of a mass lesion.  There is patchy bilateral cerebral white matter T2 and flair signal hyper intensities.  Centimeter or smaller CSF intensity   Mobitz (type) I (Wenckebach's) atrioventricular block    a. 01/2013 - asymptomatic.   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   TIA (transient ischemic attack) 2014   pt stated she had mini strokes   UTI (urinary tract infection) 03/07/2022   Vitamin D  deficiency 04/22/2014    Past Surgical History:  Procedure Laterality Date   ABDOMINAL HYSTERECTOMY     LEFT AND RIGHT HEART CATHETERIZATION WITH CORONARY ANGIOGRAM N/A 05/24/2013   Procedure: LEFT AND RIGHT HEART CATHETERIZATION WITH CORONARY ANGIOGRAM;  Surgeon: Victory LELON Claudene DOUGLAS, MD;  Location[facility];  Service: Cardiovascular;  Laterality: N/A;   LEFT  HEART CATH N/A 02/01/2020   Procedure: Left Heart Cath;  Surgeon: Cherrie Toribio SAUNDERS, MD;  Location: [facility] INVASIVE CV LAB;  Service: Cardiovascular;  Laterality: N/A;   RIGHT HEART CATH N/A 02/01/2020   Procedure: RIGHT HEART CATH;  Surgeon: Cherrie Toribio SAUNDERS, MD;  Location: [facility] INVASIVE CV LAB;  Service: Cardiovascular;  Laterality: N/A;    Prior  to Admission medications   Medication Sig Start Date End Date Taking? Authorizing Provider  acetaminophen  (TYLENOL ) 325 MG tablet Take 2 tablets (650 mg total) by mouth every 6 (six) hours as needed for mild pain (or Fever >/= 101). 11/21/22  Yes Jolaine Pac, DO  albuterol  (PROVENTIL ) (2.5 MG/3ML) 0.083% nebulizer solution Take 3 mLs (2.5 mg total) by nebulization every 6 (six) hours as needed for wheezing. 09/10/22  Yes Elnora Ip, MD  aspirin  81 MG chewable tablet Chew 1 tablet (81 mg total) by mouth daily. 09/11/22  Yes Elnora Ip, MD  calcium  carbonate (OSCAL) 1500 (600 Ca) MG TABS tablet Take 1 tablet (1,500 mg total) by mouth 3 (three) times daily with meals. 10/14/22  Yes Fernand Prost, MD  cholecalciferol  (VITAMIN D3) 25 MCG (1000 UNIT) tablet Take 1 tablet (1,000 Units total) by mouth daily. 11/21/22 11/21/23 Yes Jolaine Pac, DO  febuxostat  (ULORIC ) 40 MG tablet Take 20 mg by mouth daily. 08/07/22  Yes [provider]  furosemide  (LASIX ) 20 MG tablet Take 20 mg by mouth 3 (three) times daily. 01/04/23  Yes [provider]  hydrALAZINE  (APRESOLINE ) 100 MG tablet TAKE 1 TABLET(100 MG) BY MOUTH THREE TIMES DAILY 02/04/23 02/04/24 Yes Fernand Prost, MD  JARDIANCE  10 MG TABS tablet TAKE 1 TABLET BY MOUTH DAILY  BEFORE BREAKFAST 12/27/22  Yes Fernand Prost, MD  lidocaine  (LMX) 4 % cream Apply 1 Application topically 2 (two) times daily as needed (Pain on sacral wound area). 09/10/22  Yes Elnora Ip, MD  multivitamin (RENA-VIT) TABS tablet Take 1 tablet by mouth at bedtime. 09/10/22  Yes Elnora Ip, MD  pantoprazole  (PROTONIX ) 40 MG tablet TAKE 1 TABLET BY MOUTH DAILY 11/22/22  Yes Fernand Prost, MD  PHENobarbital  (LUMINAL) 64.8 MG tablet Take 2 tablets (129.6 mg total) by mouth at bedtime. 02/07/23  Yes Georjean Darice HERO, MD  potassium chloride  SA (KLOR-CON  M) 20 MEQ tablet Take 1 tablet (20 mEq total) by mouth daily. 02/03/23  Yes Mil[place],  Harlene HERO, FNP  rosuvastatin  (CRESTOR ) 40 MG tablet Take 1 tablet (40 mg total) by mouth daily. 01/14/22 02/03/24 Yes Milford, Harlene HERO, FNP  senna-docusate (SENOKOT S) 8.6-50 MG tablet Take 2 tablets by mouth daily. 02/25/23 05/26/23 Yes Fernand Prost, MD  sevelamer  carbonate (RENVELA ) 800 MG tablet Take 1 tablet (800 mg total) by mouth 3 (three) times daily with meals. 11/21/22 11/21/23 Yes Jolaine Pac, DO  torsemide  (DEMADEX ) 20 MG tablet Take 3 tablets (60 mg total) by mouth daily. TAKE 2 TABLETS BY MOUTH DAILY AND AN EXTRA DOSE(2 TABLETS) IN THE PM FOR SWELLING OR SHORTNESS OF BREATH 02/06/23  Yes Hamburg, Doral, FNP  zonisamide  (ZONEGRAN ) 100 MG capsule TAKE 2 CAPSULES BY MOUTH AT NIGHT 10/02/22  Yes Georjean Darice HERO, MD    Current Facility-Administered Medications  Medication Dose Route Frequency Provider Last Rate Last Admin   0.9 %  sodium chloride  infusion (Manually program via Guardrails IV Fluids)   Intravenous Once Garrick Charleston, MD 0 mL/hr at 02/25/23 1650 Restarted at 02/25/23 1650   calcium  carbonate (OS-CAL - dosed in mg of elemental calcium ) tablet 1,250 mg  1 tablet Oral TID WC McLendon, Michael, MD   1,250 mg at 02/26/23 9063   cholecalciferol  (VITAMIN D3) 25 MCG (1000 UNIT) tablet 1,000 Units  1,000 Units Oral Daily McLendon, Michael, MD   1,000 Units at 02/26/23 9063   febuxostat  (ULORIC ) tablet 20 mg  20 mg Oral Daily McLendon, Michael, MD   20 mg at 02/26/23 9062   iron  sucrose (VENOFER ) 200 mg in sodium chloride  0.9 % 100 mL IVPB  200 mg Intravenous Q24H Atway, Rayann N, DO       pantoprazole  (PROTONIX ) injection 40 mg  40 mg Intravenous Q12H Norrine Sharper, MD   40 mg at 02/26/23 9062   PHENobarbital  (LUMINAL) tablet 129.6 mg  129.6 mg Oral QHS McLendon, Michael, MD   129.6 mg at 02/25/23 2119   rosuvastatin  (CRESTOR ) tablet 40 mg  40 mg Oral Daily McLendon, Michael, MD   40 mg at 02/26/23 9063   sevelamer  carbonate (RENVELA ) tablet 800 mg  800 mg Oral TID WC  Norrine Sharper, MD   800 mg at 02/26/23 9063   torsemide  (DEMADEX ) tablet 60 mg  60 mg Oral Daily McLendon, Michael, MD   60 mg at 02/26/23 9063   zonisamide  (ZONEGRAN ) capsule 200 mg  200 mg Oral QHS Kandis Perkins, DO        Allergies as of 02/25/2023 - Review Complete 02/25/2023  Allergen Reaction Noted   Ibuprofen Other (See Comments) 10/30/2012   Lactose intolerance (gi) Diarrhea 09/03/2022    Family History  Problem Relation Age of Onset   Hypertension Other    Heart disease Mother    Hypertension Mother    Cancer Father    Obesity Son    Asthma Sister    Heart disease Sister    Heart disease Sister     Social History   Socioeconomic History   Marital status: Married    Spouse name: Johnny   Number of children: 2   Years of education: 4   Highest education level: Not on file  Occupational History   Occupation: retired  Tobacco Use   Smoking status: Former    Current packs/day: 0.00    Average packs/day: 0.5 packs/day for 10.0 years (5.0 ttl pk-yrs)    Types: Cigarettes    Start date: 06/04/1990    Quit date: 06/03/2000    Years since quitting: 22.7   Smokeless tobacco: Never  Vaping Use   Vaping status: Never Used  Substance and Sexual Activity   Alcohol use: No    Alcohol/week: 0.0 standard drinks of alcohol   Drug use: No   Sexual activity: Not Currently    Partners: Male  Other Topics Concern   Not on file  Social History Narrative   Current Social History 06/20/2020        Patient lives with spouse in a home which is 1 story. There are not steps up to the entrance, the patient uses a ramp.       Patient's method of transportation is [employer].      The highest level of education was some high school.      The patient currently disabled.      Identified important Relationships are God and my husband       Pets : None       Interests / Fun: Sitting on my front porch       Current Stressors: None        Son passed away.      Social  Drivers  of Health   Financial Resource Strain: Low Risk  (09/18/2022)   Overall Financial Resource Strain (CARDIA)    Difficulty of Paying Living Expenses: Not hard at all  Food Insecurity: No Food Insecurity (02/25/2023)   Hunger Vital Sign    Worried About Running Out of Food in the Last Year: Never true    Ran Out of Food in the Last Year: Never true  Transportation Needs: No Transportation Needs (02/25/2023)   PRAPARE - Administrator, Civil Service (Medical): No    Lack of Transportation (Non-Medical): No  Physical Activity: Inactive (09/18/2022)   Exercise Vital Sign    Days of Exercise per Week: 0 days    Minutes of Exercise per Session: 0 min  Stress: No Stress Concern Present (09/18/2022)   [employer] of Occupational Health - Occupational Stress Questionnaire    Feeling of Stress : Not at all  Social Connections: Moderately Isolated (02/25/2023)   Social Connection and Isolation Panel [NHANES]    Frequency of Communication with Friends and Family: More than three times a week    Frequency of Social Gatherings with Friends and Family: Twice a week    Attends Religious Services: Never    Database Administrator or Organizations: No    Attends Banker Meetings: Never    Marital Status: Married  Catering Manager Violence: Not At Risk (02/25/2023)   Humiliation, Afraid, Rape, and Kick questionnaire    Fear of Current or Ex-Partner: No    Emotionally Abused: No    Physically Abused: No    Sexually Abused: No    Review of Systems: As per HPI, all others negative  Physical Exam: Vital signs in last 24 hours: Temp:  [97.5 F (36.4 C)-98.6 F (37 C)] 97.8 F (36.6 C) (01/08 0757) Pulse Rate:  [58-85] 65 (01/08 0757) Resp:  [10-21] 18 (01/08 0757) BP: (137-161)/(60-92) 137/75 (01/08 0757) SpO2:  [99 %-100 %] 100 % (01/08 0757) Last BM Date : 02/25/23 General:   Alert,  Well-developed, well-nourished, pleasant and cooperative in NAD Head:  Normocephalic and  atraumatic. Eyes:  Sclera clear, no icterus.   Conjunctiva pale Ears:  Normal auditory acuity. Nose:  No deformity, discharge,  or lesions. Mouth:  No deformity or lesions.  Oropharynx pale and dry Neck:  Supple; no masses or thyromegaly. Lungs:  No visible respiratory distress Abdomen:  Soft, nontender and nondistended. No masses, hepatosplenomegaly or hernias noted. Without guarding, and without rebound.     Msk:  Symmetrical without gross deformities. Normal posture. Pulses:  Normal pulses noted. Extremities:  Without clubbing or edema. Neurologic:  Alert and  oriented x4;  grossly normal neurologically. Skin:  Intact without significant lesions or rashes. Cervical Nodes:  No significant cervical adenopathy. Psych:  Alert and cooperative. Normal mood and affect.   Lab Results: Recent Labs    02/25/23 1059 02/25/23 1424 02/26/23 0510  WBC 2.3* 2.5* 2.3*  HGB 4.6* 4.7* 8.2*  HCT 15.5* 15.6* 24.4*  PLT 185 175 155   BMET Recent Labs    02/25/23 1059 02/25/23 1424 02/26/23 0510  NA 137 137 140  K 3.5 3.5 3.5  CL 99 101 103  CO2 20* 19* 20*  GLUCOSE 111* 149* 82  BUN 138* 140* 142*  CREATININE 3.55* 3.64* 3.43*  CALCIUM  7.9* 7.8* 7.4*   LFT Recent Labs    02/25/23 1424  PROT 6.5  ALBUMIN 3.3*  AST 62*  ALT 61*  ALKPHOS 125  BILITOT 0.5   PT/INR No results for input(s): LABPROT, INR in the last 72 hours.  Studies/Results: No results found.  Impression:   Melena for few weeks. Acute on chronic anemia. CKD. Severe aortic stenosis. Multiple medical problems.  Plan:   Hold asa/nsaids/anticoagulation. CBCs, transfuse as needed. PPI. Cardiology consult to gauge cardiac risks for endoscopy; ideally would do endoscopy but if very high risk we might consider medical management. Clear liquid diet ok. [facility] will follow.   LOS: 0 days   Aalivia Mcgraw M  02/26/2023, 12:48 PM  Cell [phone] If no answer or after 5 PM call [phone]

## 2023-02-26 NOTE — Progress Notes (Signed)
 Subjective:  Sherry [name] is a 78 y.o. person living with a past medical history of severe aortic stenosis, CKD stage V, chronic anemia, seizure disorder who originally presented for stomach pain in the [facility] and admitted for symptomatic anemia.   Today, she reports feeling much better after the blood transfusions.  She denies bowel movements overnight and denies bright red blood per rectum or melena.  Family is at bedside.  Objective:  Vital signs in last 24 hours: Vitals:   02/26/23 0229 02/26/23 0427 02/26/23 0757  BP: (!) 145/78 (!) 143/69 137/75  Pulse: 60 66 65  Resp: 18 18 18   Temp: 97.9 F (36.6 C) (!) 97.5 F (36.4 C) 97.8 F (36.6 C)  TempSrc: Oral Oral Oral  SpO2: 100% 100% 100%   Physical Exam: General:NAD, chronically ill-appearing laying in bed, family at bedside Cardiac:RRR, loud decrescendo crescendo systolic murmur Pulmonary:normal effort on room air  Abdominal:soft, non-tender, bowel sounds present  Neuro:alert and awake, participating in conversation Skin:warm and dry Psych: Normal mood and affect     Latest Ref Rng & Units 02/26/2023    5:10 AM 02/25/2023    2:24 PM 02/25/2023   10:59 AM  CBC  WBC 4.0 - 10.5 K/uL 2.3  2.5  2.3   Hemoglobin 12.0 - 15.0 g/dL 8.2  4.7  4.6   Hematocrit 36.0 - 46.0 % 24.4  15.6  15.5   Platelets 150 - 400 K/uL 155  175  185        Latest Ref Rng & Units 02/26/2023    5:10 AM 02/25/2023    2:24 PM 02/25/2023   10:59 AM  BMP  Glucose 70 - 99 mg/dL 82  850  888   BUN 8 - 23 mg/dL 857  859  861   Creatinine 0.44 - 1.00 mg/dL 6.56  6.35  6.44   Sodium 135 - 145 mmol/L 140  137  137   Potassium 3.5 - 5.1 mmol/L 3.5  3.5  3.5   Chloride 98 - 111 mmol/L 103  101  99   CO2 22 - 32 mmol/L 20  19  20    Calcium  8.9 - 10.3 mg/dL 7.4  7.8  7.9      Assessment/Plan:  Principal Problem:   Symptomatic anemia Active Problems:   Aortic stenosis   ASCVD (arteriosclerotic cardiovascular disease)   Seizures (HCC)   Melena   CKD  (chronic kidney disease) stage 5, GFR less than 15 ml/min (HCC)   Declining functional status   Uremia  Symptomatic anemia Acute on Chronic Anemia Suspect chronic upper GI blood loss with symptoms of 3 weeks of melena.  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 Hemoglobin stable at 8.2 s/p 3units pRBCs.  -GI consulted: endoscopy after cardiac evaluation, appreciate recommendations -Clear liquid diet -Holding ASA -PPI IV 40mg  BID  -IV iron , 200 mg venofer    Severe Aortic Stenosis Systolic murmur that radiates to bilateral carotids severe stenosis noted on echocardiogram.  Plan:  -Cardiology consulted to evaluate for cardiac risk prior to endoscopy -Appreciate their recommendations    -Continue torsemide  60mg  daily   ASCVD (arteriosclerotic cardiovascular disease) History of lacunar cerebral infarctions and coronary artery disease.   -Holding aspirin  in light of workup for ongoing GI bleeding. -Continue Crestor  40 mg    Seizures (HCC) History of seizures.  Takes nightly phenobarbital  and zonisamide  for this.  Will continue this medicine.  CKD (chronic kidney disease) stage 5, GFR less  than 15 ml/min (HCC) Stable GFR, still consistent with CKD 5 range.  She does have a mild anion gap acidosis but her potassium is okay.   A little bit volume overloaded but respiratory status is okay.  I do wonder if the degree of uremia is because of bleeding plus impaired filtration at the level of the kidneys.  I think this could be an explanation for why she feels so tired the last few weeks, but I do not think that there is an indication for urgent dialysis. -Continue to monitor with BMP  -Continue with home oscal, renvela   Declining functional status Impaired mobility.  Moves around with a walker and sometimes a wheelchair.  Tries to do some light housework to help out her husband but has not been able to do much of this recently.  I anticipate that this person may  need some rehabilitation outside of the hospital, perhaps subacute level rehab.   -PT/OT   Uremia BUN 142 on exam today.  It was as high as 150 in October, which does precede her malaise, some may be that uremia is not as responsible as the anemia.  At any rate I do think it is probably a contributing factor.  As above, no indication for urgent dialysis right now.  Supportive care for itching and nausea should it arise.  Resolved Problems:   __________________________________ Prior to Admission Living Arrangement: Anticipated Discharge Location: Barriers to Discharge: Dispo: Anticipated discharge in approximately 1-2 day(s).   Kandis Perkins, DO 02/26/2023, 9:26 AM Pager: [phone] After 5pm on weekdays and 1pm on weekends: On Call pager [phone]

## 2023-02-27 ENCOUNTER — Telehealth: Payer: Self-pay

## 2023-02-27 DIAGNOSIS — L89152 Pressure ulcer of sacral region, stage 2: Secondary | ICD-10-CM | POA: Diagnosis present

## 2023-02-27 DIAGNOSIS — I350 Nonrheumatic aortic (valve) stenosis: Secondary | ICD-10-CM | POA: Diagnosis not present

## 2023-02-27 DIAGNOSIS — D649 Anemia, unspecified: Secondary | ICD-10-CM | POA: Diagnosis not present

## 2023-02-27 HISTORY — DX: Pressure ulcer of sacral region, stage 2: L89.152

## 2023-02-27 LAB — MICROALBUMIN / CREATININE URINE RATIO
Creatinine, Urine: 44.4 mg/dL
Microalb/Creat Ratio: 258 mg/g{creat} — ABNORMAL HIGH (ref 0–29)
Microalbumin, Urine: 114.7 ug/mL

## 2023-02-27 LAB — RENAL FUNCTION PANEL
Albumin: 2.9 g/dL — ABNORMAL LOW (ref 3.5–5.0)
Anion gap: 13 (ref 5–15)
BUN: 119 mg/dL — ABNORMAL HIGH (ref 8–23)
CO2: 21 mmol/L — ABNORMAL LOW (ref 22–32)
Calcium: 7.4 mg/dL — ABNORMAL LOW (ref 8.9–10.3)
Chloride: 106 mmol/L (ref 98–111)
Creatinine, Ser: 3.23 mg/dL — ABNORMAL HIGH (ref 0.44–1.00)
GFR, Estimated: 14 mL/min — ABNORMAL LOW (ref >60)
Glucose, Bld: 80 mg/dL (ref 70–99)
Phosphorus: 6 mg/dL — ABNORMAL HIGH (ref 2.5–4.6)
Potassium: 3.3 mmol/L — ABNORMAL LOW (ref 3.5–5.1)
Sodium: 140 mmol/L (ref 135–145)

## 2023-02-27 LAB — CBC
HCT: 26.2 % — ABNORMAL LOW (ref 36.0–46.0)
Hemoglobin: 8.6 g/dL — ABNORMAL LOW (ref 12.0–15.0)
MCH: 27.8 pg (ref 26.0–34.0)
MCHC: 32.8 g/dL (ref 30.0–36.0)
MCV: 84.8 fL (ref 80.0–100.0)
Platelets: 170 10*3/uL (ref 150–400)
RBC: 3.09 MIL/uL — ABNORMAL LOW (ref 3.87–5.11)
RDW: 16.3 % — ABNORMAL HIGH (ref 11.5–15.5)
WBC: 2.7 10*3/uL — ABNORMAL LOW (ref 4.0–10.5)
nRBC: 0.7 % — ABNORMAL HIGH (ref 0.0–0.2)

## 2023-02-27 LAB — ERYTHROPOIETIN: Erythropoietin: 938 m[IU]/mL — ABNORMAL HIGH (ref 2.6–18.5)

## 2023-02-27 MED ORDER — SODIUM CHLORIDE 0.9 % IV SOLN
200.0000 mg | Freq: Once | INTRAVENOUS | Status: AC
  Administered 2023-02-27: 200 mg via INTRAVENOUS
  Filled 2023-02-27: qty 10

## 2023-02-27 MED ORDER — POLYETHYLENE GLYCOL 3350 17 G PO PACK
17.0000 g | PACK | Freq: Every day | ORAL | Status: DC
  Administered 2023-02-27 – 2023-03-01 (×2): 17 g via ORAL
  Filled 2023-02-27 (×3): qty 1

## 2023-02-27 MED ORDER — POTASSIUM CHLORIDE CRYS ER 20 MEQ PO TBCR
20.0000 meq | EXTENDED_RELEASE_TABLET | Freq: Once | ORAL | Status: AC
  Administered 2023-02-27: 20 meq via ORAL
  Filled 2023-02-27: qty 1

## 2023-02-27 NOTE — H&P (View-Only) (Signed)
 Subjective: No abdominal pain. Tolerating liquid diet. Intermittent melena.  Objective: Vital signs in last 24 hours: Temp:  [97.9 F (36.6 C)-98.3 F (36.8 C)] 98.3 F (36.8 C) (01/09 0743) Pulse Rate:  [64-72] 71 (01/09 0743) Resp:  [18-20] 20 (01/09 0743) BP: (134-150)/(65-91) 141/83 (01/09 0743) SpO2:  [95 %-100 %] 100 % (01/09 0743) Weight change:  Last BM Date : 02/25/23  PE: GEN:  Chronically ill-appearing, NAD ABD:  Soft, non-tender NEURO:  No encephalopathy  Lab Results: CBC    Component Value Date/Time   WBC 2.7 (L) 02/27/2023 0414   RBC 3.09 (L) 02/27/2023 0414   HGB 8.6 (L) 02/27/2023 0414   HGB 11.4 (L) 06/15/2021 0901   HGB 10.3 (L) 02/20/2021 0754   HCT 26.2 (L) 02/27/2023 0414   HCT 30.1 (L) 02/20/2021 0754   PLT 170 02/27/2023 0414   PLT 195 06/15/2021 0901   PLT 62 (LL) 02/20/2021 0754   MCV 84.8 02/27/2023 0414   MCV 87 02/20/2021 0754   MCH 27.8 02/27/2023 0414   MCHC 32.8 02/27/2023 0414   RDW 16.3 (H) 02/27/2023 0414   RDW 12.7 02/20/2021 0754   LYMPHSABS 0.6 (L) 02/25/2023 1059   LYMPHSABS 0.6 (L) 02/20/2021 0754   MONOABS 0.3 02/25/2023 1059   EOSABS 0.0 02/25/2023 1059   EOSABS 0.1 02/20/2021 0754   BASOSABS 0.0 02/25/2023 1059   BASOSABS 0.0 02/20/2021 0754  CMP     Component Value Date/Time   NA 140 02/27/2023 0414   NA 140 11/11/2022 1437   K 3.3 (L) 02/27/2023 0414   CL 106 02/27/2023 0414   CO2 21 (L) 02/27/2023 0414   GLUCOSE 80 02/27/2023 0414   BUN 119 (H) 02/27/2023 0414   BUN 125 (HH) 11/11/2022 1437   CREATININE 3.23 (H) 02/27/2023 0414   CREATININE 2.09 (H) 06/15/2021 0901   CREATININE 1.56 (H) 03/23/2015 0914   CALCIUM  7.4 (L) 02/27/2023 0414   CALCIUM  6.1 08/30/2022 1514   PROT 6.5 02/25/2023 1424   PROT 7.1 01/26/2021 0735   ALBUMIN 2.9 (L) 02/27/2023 0414   ALBUMIN 3.8 09/17/2022 1608   AST 62 (H) 02/25/2023 1424   AST 25 06/15/2021 0901   ALT 61 (H) 02/25/2023 1424   ALT 15 06/15/2021 0901   ALKPHOS  125 02/25/2023 1424   BILITOT 0.5 02/25/2023 1424   BILITOT 0.2 (L) 06/15/2021 0901   GFR 41.36 (L) 05/18/2013 1002   EGFR 13 (L) 11/11/2022 1437   GFRNONAA 14 (L) 02/27/2023 0414   GFRNONAA 24 (L) 06/15/2021 0901   GFRNONAA 40 (L) 09/06/2014 1109    Assessment:  Melena for few weeks. Acute on chronic anemia. CKD. Severe aortic stenosis. Multiple medical problems.  Plan:   Cardiology has seen patient and states endoscopy procedure is considered high risk but that there is nothing modifiable. As such I've explained these risks to patient, giving her option to be treated medically or have endoscopy.  She wants to have endoscopy, and is well aware of this being high-risk procedure. Accordingly, soft diet today, NPO after midnight, and EGD tomorrow. Risks (bleeding, infection, bowel perforation that could require surgery, sedation-related changes in cardiopulmonary systems), benefits (identification and possible treatment of source of symptoms, exclusion of certain causes of symptoms), and alternatives (watchful waiting, radiographic imaging studies, empiric medical treatment) of upper endoscopy (EGD) were explained to patient/family in detail and patient wishes to proceed.  [facility] will follow.   [name] 02/27/2023, 10:16 AM   Cell [phone] If no answer  or after 5 PM call [phone]

## 2023-02-27 NOTE — Consult Note (Signed)
 WOC Nurse Consult Note: Reason for Consult: Stage 2 Pressure Injury Wound type: Stage 2 Pressure Injury Pressure Injury POA: Yes Measurement:see nursing flow sheets Wound bed: clean, pink Drainage (amount, consistency, odor) none Periwound:intact  Dressing procedure/placement/frequency: Continue to manage wound with nursing skin care order set, silicone foam, change every 3 days, assess under foam each shift for acute changes.  Turn and reposition at least every 2 hours.  I will add chair pressure redistribution pad for when patient is up in the chair.    Re consult if needed, will not follow at this time. Thanks  Sherry [name] [employer], RN,CWOCN, CNS, CWON-AP [phone])

## 2023-02-27 NOTE — TOC Initial Note (Addendum)
 Transition of Care [facility]) - Initial/Assessment Note    Patient Details  Name: Sherry [name] MRN: [id number] Date of Birth: 03-Jul-1945  Transition of Care [facility]) CM/SW Contact:    Roxie KANDICE Stain, RN Phone Number: 02/27/2023, 3:18 PM  Clinical Narrative:                  Spoke to patient, spouse and granddaughter at bedside regarding transition needs. Patient lives with her husband and has wheelchair, scooter, walker and hoyer lift.  Patient requesting BSC with drop down arm. Notified Jeramine with [employer] of order. Patient requesting [facility] aide services. Application started and placed on the chart for MD to complete. Once MD completes [facility] will email application.  Patient uses [facility] for transportation. Patient has used [facility] in the past for home health needs. Kelly with [facility] can accept with start date 03/08/2023. Amy with [facility] declined, Cory with [facility] declined. Awaiting Jon with [facility] decision.   Expected Discharge Plan: Home w Home Health Services Barriers to Discharge: Continued Medical Work up   Patient Goals and CMS Choice Patient states their goals for this hospitalization and ongoing recovery are:: return home with home health CMS Medicare.gov Compare Post Acute Care list provided to:: Patient Choice offered to / list presented to : Patient      Expected Discharge Plan and Services   Discharge Planning Services: CM Consult Post Acute Care Choice: Home Health Living arrangements for the past 2 months: Single Family Home                 DME Arranged: Bedside commode DME Agency: [employer] Date DME Agency Contacted: 02/27/23 Time DME Agency Contacted: 1517 Representative spoke with at DME Agency: [place] HH Arranged: PT          Prior Living Arrangements/Services Living arrangements for the past 2 months: Single Family Home Lives with:: Spouse Patient language and need for interpreter reviewed:: Yes Do you feel safe going  back to the place where you live?: Yes      Need for Family Participation in Patient Care: Yes (Comment) Care giver support system in place?: Yes (comment) Current home services: DME (scooter, wheelchair, hoyer lift, walker, BSC) Criminal Activity/Legal Involvement Pertinent to Current Situation/Hospitalization: No - Comment as needed  Activities of Daily Living   ADL Screening (condition at time of admission) Independently performs ADLs?: No Does the patient have a NEW difficulty with bathing/dressing/toileting/self-feeding that is expected to last >3 days?: No (husband helps) Does the patient have a NEW difficulty with getting in/out of bed, walking, or climbing stairs that is expected to last >3 days?: No (husband helps) Does the patient have a NEW difficulty with communication that is expected to last >3 days?: No Is the patient deaf or have difficulty hearing?: No Does the patient have difficulty seeing, even when wearing glasses/contacts?: No Does the patient have difficulty concentrating, remembering, or making decisions?: No  Permission Sought/Granted                  Emotional Assessment Appearance:: Appears stated age Attitude/Demeanor/Rapport: Engaged Affect (typically observed): Accepting Orientation: : Oriented to Self, Oriented to Place, Oriented to  Time, Oriented to Situation Alcohol / Substance Use: Not Applicable Psych Involvement: No (comment)  Admission diagnosis:  Symptomatic anemia [D64.9] Patient Active Problem List   Diagnosis Date Noted   Pressure injury of sacral region, stage 2 (HCC) 02/27/2023   Coronary artery disease involving coronary bypass graft of native heart without angina pectoris  02/26/2023   Hx of transient ischemic attack (TIA) 02/26/2023   Chronic heart failure with preserved ejection fraction (HCC) 02/26/2023   Second degree type I atrioventricular block 02/26/2023   First degree AV block 02/26/2023   Severe aortic stenosis  02/26/2023   Preprocedural cardiovascular examination 02/26/2023   Normocytic anemia 02/25/2023   Abdominal pain 02/25/2023   Symptomatic anemia 02/25/2023   Melena 02/25/2023   CKD (chronic kidney disease) stage 5, GFR less than 15 ml/min (HCC) 02/25/2023   Declining functional status 02/25/2023   Uremia 02/25/2023   Abscess 10/30/2022   CKD (chronic kidney disease) stage 4, GFR 15-29 ml/min (HCC) 09/17/2022   Acute diastolic congestive heart failure (HCC) 08/31/2022   Hypocalcemia 08/27/2022   Swelling of lower extremity 06/14/2022   Onychauxis 10/19/2021   Seizures (HCC)    Hyperlipidemia    GERD (gastroesophageal reflux disease)    Osteoporosis 10/11/2015   ASCVD (arteriosclerotic cardiovascular disease)    1st degree AV block 03/05/2013   Aortic stenosis 02/18/2013   Chronic diastolic congestive heart failure (HCC) 11/11/2012   Multiple lacunar infarcts (HCC) 05/07/2012   Degenerative cervical disc 01/20/2012   Acute kidney injury superimposed on CKD (HCC) 04/28/2011   Abnormality of gait- basically wheelchair bound 07/31/2009   Essential hypertension 06/04/2006   Osteoarthritis of lumbar spine 06/04/2006   PCP:  Fernand Prost, MD Pharmacy:   [id number] [place], [place] - [address] AT [facility] [address] [place] [address] Phone: [phone] Fax: [phone]  [facility] - [place], [place] - [address] [place] [address] Phone: [phone] Fax: [phone]  [facility] [address] [place] [address] Phone: [phone] Fax: [phone]     Social Drivers of Health (SDOH) Social History: SDOH Screenings   Food Insecurity: No Food Insecurity (02/25/2023)  Housing: Low Risk  (02/25/2023)  Transportation Needs: No Transportation Needs (02/25/2023)  Utilities: Not At Risk (02/25/2023)  Alcohol Screen: Low Risk  (09/18/2022)  Depression  (PHQ2-9): Low Risk  (02/25/2023)  Financial Resource Strain: Low Risk  (09/18/2022)  Physical Activity: Inactive (09/18/2022)  Social Connections: Moderately Isolated (02/25/2023)  Stress: No Stress Concern Present (09/18/2022)  Tobacco Use: Medium Risk (02/25/2023)  Health Literacy: Adequate Health Literacy (09/18/2022)   SDOH Interventions:     Readmission Risk Interventions     No data to display

## 2023-02-27 NOTE — Evaluation (Signed)
 Occupational Therapy Evaluation Patient Details Name: Sherry [name] MRN: [id number] DOB: 04-Feb-1946 Today's Date: 02/27/2023   History of Present Illness This is a 78 year old female who was directed to the ED by our clinic for abdominal pain and melena and found to have severe recurrent anemia-4.6(now s/p transfusion 8.6). For endoscopy 02/28/23. PHMx: severe aortic stenosis (not a candidate for TAVR) CKD stage V, chronic anemia, seizure disorder,HTN, type 1 AV block, diastolic CHF, CVA.   Clinical Impression   This 78 yo female admitted with above presents to acute OT with PLOF of needing some A for lateral transfers to/from Hoveround/bed, BSC and A for LBADLs/toileting. Currently she is Max A for lateral transfers without transfer board from bed<>regular chair without arms in room with truly more difficulty going back to bed (surfaces level), she is able to maintain sitting at EOB without UE support, needs Mod A for OOB and Max A to back into bed (needs more A for legs than UB and scooting). She will continue to benefit from acute OT with follow up HHOT.      If plan is discharge home, recommend the following: A lot of help with walking and/or transfers;A lot of help with bathing/dressing/bathroom;Assistance with cooking/housework;Assist for transportation;Help with stairs or ramp for entrance    Functional Status Assessment  Patient has had a recent decline in their functional status and demonstrates the ability to make significant improvements in function in a reasonable and predictable amount of time.  Equipment Recommendations   (drop arm BSC)       Precautions / Restrictions Precautions Precautions: Fall Restrictions Weight Bearing Restrictions Per Provider Order: No      Mobility Bed Mobility Overal bed mobility: Needs Assistance Bed Mobility: Rolling, Sidelying to Sit, Sit to Sidelying Rolling: Contact guard assist, Used rails (increased time) Sidelying to sit: Mod  assist (A for legs of of bed and scoot around to EOB--coming up on right side of bed)     Sit to sidelying: Max assist (due to close to EOB and leaning posteriorly when trying to A with scooting posteriorly on bed)      Transfers Overall transfer level: Needs assistance   Transfers: Bed to chair/wheelchair/BSC            Lateral/Scoot Transfers: Max assist (level surfaces) General transfer comment: Pt could not really get the lift to do a lateral scoot      Balance Overall balance assessment: Needs assistance Sitting-balance support: No upper extremity supported, Feet supported Sitting balance-Leahy Scale: Fair                                     ADL either performed or assessed with clinical judgement   ADL Overall ADL's : Needs assistance/impaired Eating/Feeding: Set up;Bed level   Grooming: Wash/dry hands;Bed level   Upper Body Bathing: Set up;Bed level   Lower Body Bathing: Total assistance;Bed level   Upper Body Dressing : Set up;Bed level   Lower Body Dressing: Total assistance;Bed level   Toilet Transfer: Maximal assistance Toilet Transfer Details (indicate cue type and reason): lateral scoot simulated from bed to regular chair no arms in room Toileting- Clothing Manipulation and Hygiene: +2 for physical assistance;Sitting/lateral lean               Vision Patient Visual Report: No change from baseline              Pertinent Vitals/Pain  Pain Assessment Pain Assessment: No/denies pain     Extremity/Trunk Assessment Upper Extremity Assessment Upper Extremity Assessment: Generalized weakness           Communication Communication Communication: No apparent difficulties   Cognition Arousal: Alert Behavior During Therapy: WFL for tasks assessed/performed Overall Cognitive Status: Within Functional Limits for tasks assessed                                                  Home Living Family/patient  expects to be discharged to:: Private residence Living Arrangements: Spouse/significant other Available Help at Discharge: Family;Available 24 hours/day Type of Home: House Home Access: Ramped entrance     Home Layout: One level     Bathroom Shower/Tub: Sponge bathes at baseline   [employer]: Standard     Home Equipment: Wheelchair - power;Wheelchair - manual;BSC/3in1   Additional Comments: power chair is a Hoverround, BSC is old (really needs a drop arm one since she does lateral transfers)      Prior Functioning/Environment Prior Level of Function : Needs assist       Physical Assist : Mobility (physical);ADLs (physical) Mobility (physical): Bed mobility;Transfers ADLs (physical): Bathing;Dressing;Toileting Mobility Comments: pt and husband report pt scoots to/from power w/c with assist from family, does not stand; does not use transfer board (need to try this and see if easier); pt reports she can get herself up to EOB on her own (regular bed no rails) ADLs Comments: Can wash and dress her UB from [facility], but needs A from husband with LB ADLs (lateral leans)        OT Problem List: Decreased strength;Impaired balance (sitting and/or standing)      OT Treatment/Interventions: Self-care/ADL training;DME and/or AE instruction;Balance training;Patient/family education;Therapeutic exercise    OT Goals(Current goals can be found in the care plan section) Acute Rehab OT Goals Patient Stated Goal: to find out about bleeding and go home with [facility] therapies OT Goal Formulation: With patient/family Time For Goal Achievement: 03/13/23 Potential to Achieve Goals: Good  OT Frequency: Min 1X/week       AM-PAC OT 6 Clicks Daily Activity     Outcome Measure Help from another person eating meals?: A Little Help from another person taking care of personal grooming?: A Little Help from another person toileting, which includes using toliet, bedpan, or urinal?: Total Help  from another person bathing (including washing, rinsing, drying)?: A Lot Help from another person to put on and taking off regular upper body clothing?: A Little Help from another person to put on and taking off regular lower body clothing?: Total 6 Click Score: 13   End of Session Equipment Utilized During Treatment: Gait belt Nurse Communication: Mobility status;Need for lift equipment  Activity Tolerance: Patient tolerated treatment well Patient left: in bed;with call bell/phone within reach;with bed alarm set;with family/visitor present  OT Visit Diagnosis: Other abnormalities of gait and mobility (R26.89);Muscle weakness (generalized) (M62.81)                Time: 8782-8744 OT Time Calculation (min): 38 min Charges:  OT General Charges $OT Visit: 1 Visit OT Evaluation $OT Eval Moderate Complexity: 1 Mod OT Treatments $Self Care/Home Management : 23-37 mins  Donny BECKER OT Acute Rehabilitation Services Office [phone]    [place] Dorothyann Distel 02/27/2023, 1:19 PM

## 2023-02-27 NOTE — Consult Note (Signed)
 Subjective: No abdominal pain. Tolerating liquid diet. Intermittent melena.  Objective: Vital signs in last 24 hours: Temp:  [97.9 F (36.6 C)-98.3 F (36.8 C)] 98.3 F (36.8 C) (01/09 0743) Pulse Rate:  [64-72] 71 (01/09 0743) Resp:  [18-20] 20 (01/09 0743) BP: (134-150)/(65-91) 141/83 (01/09 0743) SpO2:  [95 %-100 %] 100 % (01/09 0743) Weight change:  Last BM Date : 02/25/23  PE: GEN:  Chronically ill-appearing, NAD ABD:  Soft, non-tender NEURO:  No encephalopathy  Lab Results: CBC    Component Value Date/Time   WBC 2.7 (L) 02/27/2023 0414   RBC 3.09 (L) 02/27/2023 0414   HGB 8.6 (L) 02/27/2023 0414   HGB 11.4 (L) 06/15/2021 0901   HGB 10.3 (L) 02/20/2021 0754   HCT 26.2 (L) 02/27/2023 0414   HCT 30.1 (L) 02/20/2021 0754   PLT 170 02/27/2023 0414   PLT 195 06/15/2021 0901   PLT 62 (LL) 02/20/2021 0754   MCV 84.8 02/27/2023 0414   MCV 87 02/20/2021 0754   MCH 27.8 02/27/2023 0414   MCHC 32.8 02/27/2023 0414   RDW 16.3 (H) 02/27/2023 0414   RDW 12.7 02/20/2021 0754   LYMPHSABS 0.6 (L) 02/25/2023 1059   LYMPHSABS 0.6 (L) 02/20/2021 0754   MONOABS 0.3 02/25/2023 1059   EOSABS 0.0 02/25/2023 1059   EOSABS 0.1 02/20/2021 0754   BASOSABS 0.0 02/25/2023 1059   BASOSABS 0.0 02/20/2021 0754  CMP     Component Value Date/Time   NA 140 02/27/2023 0414   NA 140 11/11/2022 1437   K 3.3 (L) 02/27/2023 0414   CL 106 02/27/2023 0414   CO2 21 (L) 02/27/2023 0414   GLUCOSE 80 02/27/2023 0414   BUN 119 (H) 02/27/2023 0414   BUN 125 (HH) 11/11/2022 1437   CREATININE 3.23 (H) 02/27/2023 0414   CREATININE 2.09 (H) 06/15/2021 0901   CREATININE 1.56 (H) 03/23/2015 0914   CALCIUM  7.4 (L) 02/27/2023 0414   CALCIUM  6.1 08/30/2022 1514   PROT 6.5 02/25/2023 1424   PROT 7.1 01/26/2021 0735   ALBUMIN 2.9 (L) 02/27/2023 0414   ALBUMIN 3.8 09/17/2022 1608   AST 62 (H) 02/25/2023 1424   AST 25 06/15/2021 0901   ALT 61 (H) 02/25/2023 1424   ALT 15 06/15/2021 0901   ALKPHOS  125 02/25/2023 1424   BILITOT 0.5 02/25/2023 1424   BILITOT 0.2 (L) 06/15/2021 0901   GFR 41.36 (L) 05/18/2013 1002   EGFR 13 (L) 11/11/2022 1437   GFRNONAA 14 (L) 02/27/2023 0414   GFRNONAA 24 (L) 06/15/2021 0901   GFRNONAA 40 (L) 09/06/2014 1109    Assessment:  Melena for few weeks. Acute on chronic anemia. CKD. Severe aortic stenosis. Multiple medical problems.  Plan:   Cardiology has seen patient and states endoscopy procedure is considered high risk but that there is nothing modifiable. As such I've explained these risks to patient, giving her option to be treated medically or have endoscopy.  She wants to have endoscopy, and is well aware of this being high-risk procedure. Accordingly, soft diet today, NPO after midnight, and EGD tomorrow. Risks (bleeding, infection, bowel perforation that could require surgery, sedation-related changes in cardiopulmonary systems), benefits (identification and possible treatment of source of symptoms, exclusion of certain causes of symptoms), and alternatives (watchful waiting, radiographic imaging studies, empiric medical treatment) of upper endoscopy (EGD) were explained to patient/family in detail and patient wishes to proceed.  [facility] will follow.   Sherry [name] 02/27/2023, 10:16 AM   Cell [phone] If no answer  or after 5 PM call [phone]

## 2023-02-27 NOTE — Progress Notes (Signed)
   Telephone encounter was:  Successful.  Complex Care Management Note Care Guide Note  02/27/2023 Name: Sherry [name] MRN: [id number] DOB: August 10, 1945  [name] is a 78 y.o. year old female who is a primary care patient of Fernand Prost, MD . The community resource team was consulted for assistance with Transportation Needs   SDOH screenings and interventions completed:  No     Care guide performed the following interventions: Patient provided with information about care guide support team and interviewed to confirm resource needs.Patient requested I call back   Follow Up Plan:  Care guide will follow up with patient by phone over the next day  Encounter Outcome:  Patient Request to Call Back    Jon Colt [facility]  [facility], [facility] Guide, Phone: [phone] Website: [email or web address]

## 2023-02-27 NOTE — Progress Notes (Signed)
 Subjective:  Sherry [name] is a 78 y.o. person living with a past medical history of severe aortic stenosis, CKD stage V, chronic anemia, seizure disorder who originally presented for stomach pain in the [facility] and admitted for symptomatic anemia.   Today, she reports feeling better. She denies dizziness, feeling light headed, SOB, bleeding, recent BM, and abdominal pain. She understands that she is a high risk for endoscopy due to her cardiac history.   Objective:  Vital signs in last 24 hours: Vitals:   02/26/23 2037 02/27/23 0131 02/27/23 0423 02/27/23 0743  BP: 136/87 (!) 150/91 134/65 (!) 141/83  Pulse: 72 67 65 71  Resp: 18 18 18 20   Temp: 98 F (36.7 C) 98.2 F (36.8 C) 97.9 F (36.6 C) 98.3 F (36.8 C)  TempSrc: Oral Oral Oral Oral  SpO2: 95% 98% 99% 100%   Physical Exam: General:NAD, laying in bed, family at bedside  Cardiac:RRR, loud systolic murmur auscultated Pulmonary:normal effort on room air  Abdominal:soft, non-tender, no guarding or distention  Neuro:alert, awake, participating in conversation  Skin:warm and dry Psych:  pleasant, normal mood and affect      Latest Ref Rng & Units 02/27/2023    4:14 AM 02/26/2023    5:10 AM 02/25/2023    2:24 PM  CBC  WBC 4.0 - 10.5 K/uL 2.7  2.3  2.5   Hemoglobin 12.0 - 15.0 g/dL 8.6  8.2  4.7   Hematocrit 36.0 - 46.0 % 26.2  24.4  15.6   Platelets 150 - 400 K/uL 170  155  175        Latest Ref Rng & Units 02/27/2023    4:14 AM 02/26/2023    5:10 AM 02/25/2023    2:24 PM  BMP  Glucose 70 - 99 mg/dL 80  82  850   BUN 8 - 23 mg/dL 880  857  859   Creatinine 0.44 - 1.00 mg/dL 6.76  6.56  6.35   Sodium 135 - 145 mmol/L 140  140  137   Potassium 3.5 - 5.1 mmol/L 3.3  3.5  3.5   Chloride 98 - 111 mmol/L 106  103  101   CO2 22 - 32 mmol/L 21  20  19    Calcium  8.9 - 10.3 mg/dL 7.4  7.4  7.8      Assessment/Plan:  Principal Problem:   Symptomatic anemia Active Problems:   Aortic stenosis   ASCVD (arteriosclerotic  cardiovascular disease)   Seizures (HCC)   Melena   CKD (chronic kidney disease) stage 5, GFR less than 15 ml/min (HCC)   Declining functional status   Uremia   Coronary artery disease involving coronary bypass graft of native heart without angina pectoris   Hx of transient ischemic attack (TIA)   Chronic heart failure with preserved ejection fraction (HCC)   Second degree type I atrioventricular block   First degree AV block   Severe aortic stenosis   Preprocedural cardiovascular examination  Symptomatic anemia Acute on Chronic Anemia Suspect chronic upper GI blood loss with symptoms of 3 weeks of melena.  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 Hemoglobin is stable at 8.6. No further evidence of bleeding.  Plan: -GI consulted, will follow up on today's conversation with risks vs benefit of proceeding with endoscopy; appreciate recommendations  -Continue NPO for now -IV PPI 40mg  BID  -IV iron  200 mg venofer    Severe Aortic Stenosis Systolic murmur that radiates to bilateral carotids  severe stenosis noted on echocardiogram. Cardiology was consulted and deemed patient high risk with cardiac history. Her risk factors are unable to modified; will defer to GI on wheteher or not to procede.  Plan:  -Continue torsemide  60mg  daily   ASCVD (arteriosclerotic cardiovascular disease) History of lacunar cerebral infarctions and coronary artery disease.   -Holding aspirin  in light of workup for ongoing GI bleeding. -Continue Crestor  40 mg    Seizures (HCC) History of seizures.  Takes nightly phenobarbital  and zonisamide  for this.  Will continue this medicine.  CKD (chronic kidney disease) stage 5, GFR less than 15 ml/min (HCC) Stable GFR, still consistent with CKD 5 range.  She does have a mild anion gap acidosis but her potassium is okay.   A little bit volume overloaded but respiratory status is okay.  I do wonder if the degree of uremia is because of  bleeding plus impaired filtration at the level of the kidneys.  I think this could be an explanation for why she feels so tired the last few weeks, but I do not think that there is an indication for urgent dialysis. -Continue to monitor with BMP  -Continue with home oscal, renvela   Declining functional status Impaired mobility.  Moves around with a walker and sometimes a wheelchair.  Tries to do some light housework to help out her husband but has not been able to do much of this recently.  I anticipate that this person may need some rehabilitation outside of the hospital, perhaps subacute level rehab.   -PT/OT   Uremia BUN is 119 today.  It was as high as 150 in October, which does precede her malaise, some may be that uremia is not as responsible as the anemia.  At any rate I do think it is probably a contributing factor.  As above, no indication for urgent dialysis right now.  Supportive care for itching and nausea should it arise.  Resolved Problems:   __________________________________ Prior to Admission Living Arrangement:Home Anticipated Discharge Location:Home  Barriers to Discharge:Medical Therapy  Dispo: Anticipated discharge in approximately 1-2 day(s).   Kandis Perkins, DO 02/27/2023, 11:33 AM Pager: [phone] After 5pm on weekdays and 1pm on weekends: On Call pager [phone]

## 2023-02-27 NOTE — Plan of Care (Signed)
   Problem: Education: Goal: Knowledge of General Education information will improve Description: Including pain rating scale, medication(s)/side effects and non-pharmacologic comfort measures Outcome: Not Progressing   Problem: Health Behavior/Discharge Planning: Goal: Ability to manage health-related needs will improve Outcome: Not Progressing

## 2023-02-28 ENCOUNTER — Inpatient Hospital Stay (HOSPITAL_COMMUNITY): Payer: 59

## 2023-02-28 ENCOUNTER — Encounter (HOSPITAL_COMMUNITY)
Admission: EM | Disposition: A | Discharge status: Home Health | Payer: Self-pay | Source: Home / Self Care | Attending: Internal Medicine

## 2023-02-28 DIAGNOSIS — K2970 Gastritis, unspecified, without bleeding: Secondary | ICD-10-CM

## 2023-02-28 DIAGNOSIS — I2510 Atherosclerotic heart disease of native coronary artery without angina pectoris: Secondary | ICD-10-CM

## 2023-02-28 DIAGNOSIS — I5022 Chronic systolic (congestive) heart failure: Secondary | ICD-10-CM | POA: Diagnosis not present

## 2023-02-28 DIAGNOSIS — D649 Anemia, unspecified: Secondary | ICD-10-CM | POA: Diagnosis not present

## 2023-02-28 DIAGNOSIS — I110 Hypertensive heart disease with heart failure: Secondary | ICD-10-CM

## 2023-02-28 DIAGNOSIS — I350 Nonrheumatic aortic (valve) stenosis: Secondary | ICD-10-CM | POA: Diagnosis not present

## 2023-02-28 HISTORY — PX: ESOPHAGOGASTRODUODENOSCOPY (EGD) WITH PROPOFOL: SHX5813

## 2023-02-28 HISTORY — PX: BIOPSY: SHX5522

## 2023-02-28 LAB — RENAL FUNCTION PANEL
Albumin: 2.7 g/dL — ABNORMAL LOW (ref 3.5–5.0)
Anion gap: 13 (ref 5–15)
BUN: 113 mg/dL — ABNORMAL HIGH (ref 8–23)
CO2: 21 mmol/L — ABNORMAL LOW (ref 22–32)
Calcium: 7.3 mg/dL — ABNORMAL LOW (ref 8.9–10.3)
Chloride: 103 mmol/L (ref 98–111)
Creatinine, Ser: 3.19 mg/dL — ABNORMAL HIGH (ref 0.44–1.00)
GFR, Estimated: 15 mL/min — ABNORMAL LOW (ref >60)
Glucose, Bld: 84 mg/dL (ref 70–99)
Phosphorus: 5.5 mg/dL — ABNORMAL HIGH (ref 2.5–4.6)
Potassium: 3.6 mmol/L (ref 3.5–5.1)
Sodium: 137 mmol/L (ref 135–145)

## 2023-02-28 LAB — CBC
HCT: 27.6 % — ABNORMAL LOW (ref 36.0–46.0)
Hemoglobin: 9 g/dL — ABNORMAL LOW (ref 12.0–15.0)
MCH: 27.8 pg (ref 26.0–34.0)
MCHC: 32.6 g/dL (ref 30.0–36.0)
MCV: 85.2 fL (ref 80.0–100.0)
Platelets: 169 10*3/uL (ref 150–400)
RBC: 3.24 MIL/uL — ABNORMAL LOW (ref 3.87–5.11)
RDW: 16.3 % — ABNORMAL HIGH (ref 11.5–15.5)
WBC: 2.7 10*3/uL — ABNORMAL LOW (ref 4.0–10.5)
nRBC: 1.5 % — ABNORMAL HIGH (ref 0.0–0.2)

## 2023-02-28 LAB — MAGNESIUM: Magnesium: 2.5 mg/dL — ABNORMAL HIGH (ref 1.7–2.4)

## 2023-02-28 SURGERY — ESOPHAGOGASTRODUODENOSCOPY (EGD) WITH PROPOFOL
Anesthesia: Monitor Anesthesia Care

## 2023-02-28 MED ORDER — SODIUM CHLORIDE 0.9 % IV SOLN
INTRAVENOUS | Status: AC | PRN
  Administered 2023-02-28: 250 mL via INTRAVENOUS

## 2023-02-28 MED ORDER — PHENYLEPHRINE HCL (PRESSORS) 10 MG/ML IV SOLN
INTRAVENOUS | Status: DC | PRN
  Administered 2023-02-28: 160 ug via INTRAVENOUS
  Administered 2023-02-28: 120 ug via INTRAVENOUS

## 2023-02-28 MED ORDER — LIDOCAINE 2% (20 MG/ML) 5 ML SYRINGE
INTRAMUSCULAR | Status: DC | PRN
  Administered 2023-02-28: 80 mg via INTRAVENOUS

## 2023-02-28 MED ORDER — TRIMETHOBENZAMIDE HCL 100 MG/ML IM SOLN
200.0000 mg | Freq: Once | INTRAMUSCULAR | Status: AC
  Administered 2023-02-28: 200 mg via INTRAMUSCULAR
  Filled 2023-02-28: qty 2

## 2023-02-28 MED ORDER — ASPIRIN 81 MG PO CHEW
81.0000 mg | CHEWABLE_TABLET | Freq: Every day | ORAL | Status: DC
  Administered 2023-03-01 – 2023-03-02 (×2): 81 mg via ORAL
  Filled 2023-02-28 (×2): qty 1

## 2023-02-28 MED ORDER — PROPOFOL 10 MG/ML IV BOLUS
INTRAVENOUS | Status: DC | PRN
  Administered 2023-02-28: 40 mg via INTRAVENOUS
  Administered 2023-02-28: 20 mg via INTRAVENOUS
  Administered 2023-02-28: 40 mg via INTRAVENOUS
  Administered 2023-02-28 (×7): 20 mg via INTRAVENOUS

## 2023-02-28 MED ORDER — PHENYLEPHRINE HCL-NACL 20-0.9 MG/250ML-% IV SOLN
INTRAVENOUS | Status: DC | PRN
  Administered 2023-02-28: 50 ug/min via INTRAVENOUS

## 2023-02-28 SURGICAL SUPPLY — 14 items

## 2023-02-28 NOTE — Transfer of Care (Signed)
//  Immediate Anesthesia Transfer of Care Note  Patient: Sherry [name]  Procedure(s) Performed: ESOPHAGOGASTRODUODENOSCOPY (EGD) WITH PROPOFOL  BIOPSY  Patient Location: PACU and Endoscopy Unit  Anesthesia Type:MAC  Level of Consciousness: awake, alert , and patient cooperative  Airway & Oxygen Therapy: Patient Spontanous Breathing  Post-op Assessment: Report given to RN and Post -op Vital signs reviewed and stable  Post vital signs: Reviewed and stable  Last Vitals:  Vitals Value Taken Time  BP 149/73 (96) 02/28/23 1145  Temp    Pulse 69 02/28/23 1145  Resp 14 02/28/23 1145  SpO2 99 % 02/28/23 1145  Vitals shown include unfiled device data.  Last Pain:  Vitals:   02/28/23 1010  TempSrc: Tympanic  PainSc: 0-No pain         Complications: No notable events documented.

## 2023-02-28 NOTE — Evaluation (Signed)
 Physical Therapy Evaluation Patient Details Name: Sherry [name] MRN: [id number] DOB: [birth year] Today's Date: 02/28/2023  History of Present Illness  Pt is a 78 y/o F admitted on 02/25/23 after being referred to ED from outpatient office 2/2 2/2 low Hgb (4.6). Pt is being treated for symptomatic anemia, acute on chronic anemia. PMH: HTN, HLD, aortic stenosis, CHF, CKD IV, osteoporosis, CVA, morbid obesity, seizure disorder  Clinical Impression  Pt seen for PT evaluation with pt agreeable, spouse present for session. Per pt & spouse pt has not ambulated in years, pt performs lateral scoot transfers bed<>hoverround, BSC with assistance PRN from spouse. On this date, pt is able to complete supine>sit with hospital bed features & assistance from spouse. PT provides education re: use of slide board & assists with positioning of it. Pt performs lateral scoot bed>recliner with slide board & min<>mod assist. Pt reports liking the slide board & is eager to have one to use at home. Will continue to follow pt acutely to address balance, endurance, & increase independence with transfers.  Pt requested to don brief for transfer & pt able to do so; spouse threads brief on BLE (pt did attempt) & pt able to lean forward to clear buttocks to allow spouse to pull them up in the back. Pt left in brief per her & spouse request - nurse made aware.        If plan is discharge home, recommend the following: A lot of help with walking and/or transfers;Assistance with cooking/housework;Assist for transportation   Can travel by private vehicle        Equipment Recommendations Other (comment) (slide board)  Recommendations for Other Services       Functional Status Assessment Patient has had a recent decline in their functional status and demonstrates the ability to make significant improvements in function in a reasonable and predictable amount of time.     Precautions / Restrictions Precautions Precautions:  Fall Restrictions Weight Bearing Restrictions Per Provider Order: No      Mobility  Bed Mobility Overal bed mobility: Needs Assistance Bed Mobility: Supine to Sit     Supine to sit: HOB elevated, Used rails, Min assist (husband provides assistance PRN)          Transfers Overall transfer level: Needs assistance   Transfers: Bed to chair/wheelchair/BSC            Lateral/Scoot Transfers: Min assist, Mod assist, With slide board General transfer comment: PT provides education re: placement & use of slide board, assists pt with placement of board, cuing for hand placement, pt is able to complete bed>recliner with min<>mod assist, good ability to use BUE to assist with scooting.    Ambulation/Gait                  Stairs            Wheelchair Mobility     Tilt Bed    Modified Rankin (Stroke Patients Only)       Balance Overall balance assessment: Needs assistance Sitting-balance support: Bilateral upper extremity supported, Feet unsupported Sitting balance-Leahy Scale: Good Sitting balance - Comments: Pt able to reach feet when sitting EOB without BLE support.                                     Pertinent Vitals/Pain Pain Assessment Pain Assessment: No/denies pain    Home Living Family/patient expects to  be discharged to:: Private residence Living Arrangements: Spouse/significant other Available Help at Discharge: Family;Available 24 hours/day Type of Home: House Home Access: Ramped entrance       Home Layout: One level Home Equipment: Wheelchair - manual;BSC/3in1;Other (comment) (HoverRound)      Prior Function               Mobility Comments: Pt has not ambulated in years. Pt completes bed<>hoverround transfers with assistance from husband, via lateral scoot.       Extremity/Trunk Assessment   Upper Extremity Assessment Upper Extremity Assessment: Overall WFL for tasks assessed    Lower Extremity  Assessment Lower Extremity Assessment: RLE deficits/detail;Generalized weakness RLE Deficits / Details: BLE resting in flexion ~20 degrees, unable to fully extend BLE knees, BLE feet rotated to R    Cervical / Trunk Assessment Cervical / Trunk Assessment: Kyphotic  Communication   Communication Communication: No apparent difficulties  Cognition Arousal: Alert Behavior During Therapy: [facility] for tasks assessed/performed                                            General Comments      Exercises     Assessment/Plan    PT Assessment Patient needs continued PT services  PT Problem List Decreased strength;Decreased range of motion;Decreased activity tolerance;Decreased balance;Decreased mobility;Decreased knowledge of use of DME       PT Treatment Interventions DME instruction;Balance training;Modalities;Gait training;Neuromuscular re-education;Stair training;Functional mobility training;Therapeutic activities;Therapeutic exercise;Patient/family education;Wheelchair mobility training    PT Goals (Current goals can be found in the Care Plan section)  Acute Rehab PT Goals Patient Stated Goal: get better PT Goal Formulation: With patient/family Time For Goal Achievement: 03/14/23 Potential to Achieve Goals: Good    Frequency Min 1X/week     Co-evaluation               AM-PAC PT 6 Clicks Mobility  Outcome Measure Help needed turning from your back to your side while in a flat bed without using bedrails?: A Lot Help needed moving from lying on your back to sitting on the side of a flat bed without using bedrails?: A Lot Help needed moving to and from a bed to a chair (including a wheelchair)?: A Lot Help needed standing up from a chair using your arms (e.g., wheelchair or bedside chair)?: Total Help needed to walk in hospital room?: Total Help needed climbing 3-5 steps with a railing? : Total 6 Click Score: 9    End of Session   Activity Tolerance:  Patient tolerated treatment well Patient left: in chair;with call bell/phone within reach;with nursing/sitter in room Nurse Communication: Mobility status (pt wearing brief & family & spouse okay with this, pt able to recognize when brief needs to be changed) PT Visit Diagnosis: Other abnormalities of gait and mobility (R26.89);Muscle weakness (generalized) (M62.81)    Time: 8584-8566 PT Time Calculation (min) (ACUTE ONLY): 18 min   Charges:   PT Evaluation $PT Eval Moderate Complexity: 1 Mod   PT General Charges $$ ACUTE PT VISIT: 1 Visit         Richerd Pinal, PT, DPT 02/28/23, 2:52 PM   Richerd CHRISTELLA Pinal 02/28/2023, 2:50 PM

## 2023-02-28 NOTE — Interval H&P Note (Signed)
 History and Physical Interval Note:  02/28/2023 10:54 AM  Sherry [name]  has presented today for surgery, with the diagnosis of melena, anemia.  The various methods of treatment have been discussed with the patient and family. After consideration of risks, benefits and other options for treatment, the patient has consented to  Procedure(s): ESOPHAGOGASTRODUODENOSCOPY (EGD) WITH PROPOFOL  (N/A) as a surgical intervention.  The patient's history has been reviewed, patient examined, no change in status, stable for surgery.  I have reviewed the patient's chart and labs.  Questions were answered to the patient's satisfaction.     [name]

## 2023-02-28 NOTE — Anesthesia Preprocedure Evaluation (Signed)
 Anesthesia Evaluation  Patient identified by MRN, date of birth, ID band Patient awake    Reviewed: Allergy & Precautions, NPO status , Patient's Chart, lab work & pertinent test results  Airway Mallampati: II  TM Distance: >3 FB Neck ROM: Full    Dental no notable dental hx. (+) Teeth Intact, Dental Advisory Given   Pulmonary neg pulmonary ROS, former smoker   Pulmonary exam normal breath sounds clear to auscultation       Cardiovascular hypertension, + CAD and +CHF  Normal cardiovascular exam+ dysrhythmias + Valvular Problems/Murmurs (severe AS) AS  Rhythm:Regular Rate:Normal  TTE 2024  1. Left ventricular ejection fraction, by estimation, is 60 to 65%. The  left ventricle has normal function. The left ventricle has no regional  wall motion abnormalities. There is severe concentric left ventricular  hypertrophy. Indeterminate diastolic  filling due to E-A fusion.   2. Right ventricular systolic function is normal. The right ventricular  size is normal. Tricuspid regurgitation signal is inadequate for assessing  PA pressure.   3. Left atrial size was moderately dilated.   4. A small to moderate pericardial effusion is present. The pericardial  effusion is circumferential. Large pleural effusion in the left lateral  region.   5. The mitral valve is abnormal. Trivial mitral valve regurgitation. No  evidence of mitral stenosis. Severe mitral annular calcification.   6. The aortic valve is tricuspid. There is severe calcifcation of the  aortic valve. There is severe thickening of the aortic valve. Aortic valve  regurgitation is mild. Severe aortic valve stenosis. Aortic valve area, by  VTI measures 0.62 cm. Aortic  valve mean gradient measures 50.0 mmHg. Aortic valve Vmax measures 4.56  m/s.   7. The inferior vena cava is dilated in size with >50% respiratory  variability, suggesting right atrial pressure of 8 mmHg.      Neuro/Psych  Headaches, Seizures -,  TIA negative psych ROS   GI/Hepatic Neg liver ROS,GERD  ,,  Endo/Other  negative endocrine ROS    Renal/GU CRFRenal diseaseLab Results      Component                Value               Date                      NA                       137                 02/28/2023                CL                       103                 02/28/2023                K                        3.6                 02/28/2023                CO2  21 (L)              02/28/2023                BUN                      113 (H)             02/28/2023                CREATININE               3.19 (H)            02/28/2023                GFRNONAA                 15 (L)              02/28/2023                CALCIUM                   7.3 (L)             02/28/2023                PHOS                     5.5 (H)             02/28/2023                ALBUMIN                  2.7 (L)             02/28/2023                GLUCOSE                  84                  02/28/2023             negative genitourinary   Musculoskeletal  (+) Arthritis ,    Abdominal   Peds  Hematology  (+) Blood dyscrasia, anemia Lab Results      Component                Value               Date                      WBC                      2.7 (L)             02/28/2023                HGB                      9.0 (L)             02/28/2023                HCT                      27.6 (L)            02/28/2023  MCV                      85.2                02/28/2023                PLT                      169                 02/28/2023               Anesthesia Other Findings   Reproductive/Obstetrics                             Anesthesia Physical Anesthesia Plan  ASA: 4  Anesthesia Plan: MAC   Post-op Pain Management:    Induction: Intravenous  PONV Risk Score and Plan: Propofol  infusion and Treatment may vary due to age or  medical condition  Airway Management Planned: Natural Airway  Additional Equipment:   Intra-op Plan:   Post-operative Plan:   Informed Consent: I have reviewed the patients History and Physical, chart, labs and discussed the procedure including the risks, benefits and alternatives for the proposed anesthesia with the patient or authorized representative who has indicated his/her understanding and acceptance.     Dental advisory given  Plan Discussed with: CRNA  Anesthesia Plan Comments:         Anesthesia Quick Evaluation

## 2023-02-28 NOTE — Op Note (Signed)
 [facility] Patient Name: Sherry [name] Procedure Date : 02/28/2023 MRN: [id number] Attending MD: Elsie Cree , MD, [id number] Date of Birth: [birth year] CSN: [id number] Age: 78 Admit Type: Inpatient Procedure:                Upper GI endoscopy Indications:              Acute post hemorrhagic anemia, Melena Providers:                Elsie Cree, MD, Darleene Bare, RN, Felice Sar,                            Technician, Lorrayne Kitty, Technician, Rolin Doss, Technician Referring MD:             [facility] Medicines:                Monitored Anesthesia Care Complications:            No immediate complications. Estimated Blood Loss:     Estimated blood loss: none. Procedure:                Pre-Anesthesia Assessment:                           - Prior to the procedure, a History and Physical                            was performed, and patient medications and                            allergies were reviewed. The patient's tolerance of                            previous anesthesia was also reviewed. The risks                            and benefits of the procedure and the sedation                            options and risks were discussed with the patient.                            All questions were answered, and informed consent                            was obtained. Prior Anticoagulants: The patient has                            taken no anticoagulant or antiplatelet agents. ASA                            Grade Assessment: IV - A patient with severe  systemic disease that is a constant threat to life.                            After reviewing the risks and benefits, the patient                            was deemed in satisfactory condition to undergo the                            procedure.                           After obtaining informed consent, the endoscope was                             passed under direct vision. Throughout the                            procedure, the patient's blood pressure, pulse, and                            oxygen saturations were monitored continuously. The                            [id number]-H190 ([id number]) Olympus endoscope was introduced                            through the mouth, and advanced to the second part                            of duodenum. The upper GI endoscopy was                            accomplished without difficulty. The patient                            tolerated the procedure well. Scope In: Scope Out: Findings:      The examined esophagus was normal.      Localized moderately erythematous mucosa without bleeding was found in       the prepyloric region of the stomach. Biopsies were taken with a cold       forceps for histology.      The exam of the stomach was otherwise normal.      The duodenal bulb, first portion of the duodenum and second portion of       the duodenum were normal.      No old or fresh blood was seen to the extent of our examination. Impression:               - Normal esophagus.                           - Erythematous mucosa in the prepyloric region of  the stomach. Biopsied.                           - Normal duodenal bulb, first portion of the                            duodenum and second portion of the duodenum. Recommendation:           - Return patient to hospital ward for ongoing care.                           - Mechanical soft diet today.                           - Continue present medications.                           - Await pathology results.                           - Perform a CT scan (computed tomography) of                            abdomen without contrast and pelvis without                            contrast at appointment to be scheduled for                            evaluation of anemia and melena.                           - Pending CT scan and  Hgb trend, consider                            colonoscopy (patient very high risk given                            functional limitations, severe aortic stenosis,                            among others).                           [facility] will follow. Procedure Code(s):        --- Professional ---                           [phone], Esophagogastroduodenoscopy, flexible,                            transoral; with biopsy, single or multiple Diagnosis Code(s):        --- Professional ---                           K31.89, Other diseases of stomach and duodenum  D62, Acute posthemorrhagic anemia                           K92.1, Melena (includes Hematochezia) CPT copyright 2022 American Medical Association. All rights reserved. The codes documented in this report are preliminary and upon coder review may  be revised to meet current compliance requirements. Elsie Cree, MD 02/28/2023 11:35:56 AM This report has been signed electronically. Number of Addenda: 0

## 2023-02-28 NOTE — Plan of Care (Signed)

## 2023-02-28 NOTE — Consult Note (Signed)
 WOC consulted for Stage 2 on sacrum; seen this admission for same.  See orders and consult notes.  Sherry [name] [facility], CNS, [employer] [phone]

## 2023-02-28 NOTE — Progress Notes (Signed)
 Subjective:  Sherry [name] is a 78 y.o. person living with a past medical history of severe aortic stenosis, CKD stage V, chronic anemia, seizure disorder who originally presented for stomach pain in the [facility] and admitted for symptomatic anemia.   Today, she reported feeling well.  She denies a bowel movement overnight, she denies any bright red blood per rectum or melena.  She denies abdominal pain.  Objective:  Vital signs in last 24 hours: Vitals:   02/28/23 1143 02/28/23 1150 02/28/23 1202 02/28/23 1236  BP: (!) 149/73 (!) 145/81 (!) 162/74 (!) 169/77  Pulse: 69 62 61 72  Resp: 12 13 14 18   Temp: 97.8 F (36.6 C)   98.3 F (36.8 C)  TempSrc: Temporal   Oral  SpO2: 99% 100% 100% 98%  Weight:      Height:       Physical Exam: General: No acute distress, laying in bed Cardiac: Regular rate rhythm, mild systolic murmur auscultated  Pulmonary: Normal effort on room air Abdominal: Soft, no tenderness with palpation Neuro: Awake, alert, participating in conversation Skin: Warm and dry Psych: Normal mood and affect     Latest Ref Rng & Units 02/28/2023    4:33 AM 02/27/2023    4:14 AM 02/26/2023    5:10 AM  CBC  WBC 4.0 - 10.5 K/uL 2.7  2.7  2.3   Hemoglobin 12.0 - 15.0 g/dL 9.0  8.6  8.2   Hematocrit 36.0 - 46.0 % 27.6  26.2  24.4   Platelets 150 - 400 K/uL 169  170  155        Latest Ref Rng & Units 02/28/2023    4:33 AM 02/27/2023    4:14 AM 02/26/2023    5:10 AM  BMP  Glucose 70 - 99 mg/dL 84  80  82   BUN 8 - 23 mg/dL 886  880  857   Creatinine 0.44 - 1.00 mg/dL 6.80  6.76  6.56   Sodium 135 - 145 mmol/L 137  140  140   Potassium 3.5 - 5.1 mmol/L 3.6  3.3  3.5   Chloride 98 - 111 mmol/L 103  106  103   CO2 22 - 32 mmol/L 21  21  20    Calcium  8.9 - 10.3 mg/dL 7.3  7.4  7.4      Assessment/Plan:  Principal Problem:   Symptomatic anemia Active Problems:   Aortic stenosis   ASCVD (arteriosclerotic cardiovascular disease)   Seizures (HCC)   Melena   CKD  (chronic kidney disease) stage 5, GFR less than 15 ml/min (HCC)   Declining functional status   Uremia   Coronary artery disease involving coronary bypass graft of native heart without angina pectoris   Hx of transient ischemic attack (TIA)   Chronic heart failure with preserved ejection fraction (HCC)   Second degree type I atrioventricular block   First degree AV block   Severe aortic stenosis   Preprocedural cardiovascular examination   Pressure injury of sacral region, stage 2 (HCC)  Symptomatic anemia Acute on Chronic Anemia Suspect chronic upper GI blood loss with symptoms of 3 weeks of melena.  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 Hemoglobin is stable at 9.0. No further evidence of bleeding. GI is consulted, EGD did not reveal a source of the bleed. GI is recommending inpatient CT without contrast and possible colonoscopy.  Plan: -CT abdomen/pelvis w/out contrast  -Clear liquid diet  -IV PPI 40mg   BID  -IV iron  200 mg venofer    Severe Aortic Stenosis HFpEF Systolic murmur that radiates to bilateral carotids severe stenosis noted on echocardiogram. Cardiology was consulted and deemed patient high risk with cardiac history.  Plan:  -Continue torsemide  60mg  daily   ASCVD (arteriosclerotic cardiovascular disease) History of lacunar cerebral infarctions and coronary artery disease.   -Will restart aspirin  81 mg  -Continue Crestor  40 mg    Seizures (HCC) History of seizures.  Takes nightly phenobarbital  and zonisamide  for this.  Will continue this medicine.  CKD (chronic kidney disease) stage 5, GFR less than 15 ml/min (HCC) Stable GFR, still consistent with CKD 5 range.  She does have a mild anion gap acidosis but her potassium is okay.   A little bit volume overloaded but respiratory status is okay.  I do wonder if the degree of uremia is because of bleeding plus impaired filtration at the level of the kidneys.  I think this could be an  explanation for why she feels so tired the last few weeks, but I do not think that there is an indication for urgent dialysis. -Continue to monitor with BMP  -Continue with home oscal, renvela   Declining functional status Impaired mobility.  Moves around with a walker and sometimes a wheelchair.  Tries to do some light housework to help out her husband but has not been able to do much of this recently.  I anticipate that this person may need some rehabilitation outside of the hospital, perhaps subacute level rehab.   -PT/OT   Uremia BUN is 113 today.  It was as high as 150 in October, which does precede her malaise, some may be that uremia is not as responsible as the anemia. As above, no indication for urgent dialysis right now.  Supportive care for itching and nausea should it arise.  Stage 2 Pressure Ulcer, POA -WOC consulted   Resolved Problems:   __________________________________ VTE Prophylaxis:SCDs  Diet: Clear liquid diet  PCQ:Wnwz  Barriers to Discharge:Medical Therapy  Dispo: Anticipated discharge in approximately 1-2 day(s).   Kandis Perkins, DO 02/28/2023, 1:01 PM Pager: [phone] After 5pm on weekdays and 1pm on weekends: On Call pager [phone]

## 2023-03-01 DIAGNOSIS — I3139 Other pericardial effusion (noninflammatory): Secondary | ICD-10-CM | POA: Insufficient documentation

## 2023-03-01 HISTORY — DX: Other pericardial effusion (noninflammatory): I31.39

## 2023-03-01 LAB — CBC
HCT: 28 % — ABNORMAL LOW (ref 36.0–46.0)
Hemoglobin: 9 g/dL — ABNORMAL LOW (ref 12.0–15.0)
MCH: 27.9 pg (ref 26.0–34.0)
MCHC: 32.1 g/dL (ref 30.0–36.0)
MCV: 86.7 fL (ref 80.0–100.0)
Platelets: 190 10*3/uL (ref 150–400)
RBC: 3.23 MIL/uL — ABNORMAL LOW (ref 3.87–5.11)
RDW: 16.2 % — ABNORMAL HIGH (ref 11.5–15.5)
WBC: 3 10*3/uL — ABNORMAL LOW (ref 4.0–10.5)
nRBC: 1 % — ABNORMAL HIGH (ref 0.0–0.2)

## 2023-03-01 LAB — RENAL FUNCTION PANEL
Albumin: 2.5 g/dL — ABNORMAL LOW (ref 3.5–5.0)
Anion gap: 12 (ref 5–15)
BUN: 105 mg/dL — ABNORMAL HIGH (ref 8–23)
CO2: 24 mmol/L (ref 22–32)
Calcium: 7.3 mg/dL — ABNORMAL LOW (ref 8.9–10.3)
Chloride: 104 mmol/L (ref 98–111)
Creatinine, Ser: 3.11 mg/dL — ABNORMAL HIGH (ref 0.44–1.00)
GFR, Estimated: 15 mL/min — ABNORMAL LOW (ref >60)
Glucose, Bld: 91 mg/dL (ref 70–99)
Phosphorus: 5 mg/dL — ABNORMAL HIGH (ref 2.5–4.6)
Potassium: 3.7 mmol/L (ref 3.5–5.1)
Sodium: 140 mmol/L (ref 135–145)

## 2023-03-01 LAB — MAGNESIUM: Magnesium: 2.4 mg/dL (ref 1.7–2.4)

## 2023-03-01 MED ORDER — SODIUM CHLORIDE 0.9 % IV SOLN
200.0000 mg | Freq: Once | INTRAVENOUS | Status: AC
  Administered 2023-03-01: 200 mg via INTRAVENOUS
  Filled 2023-03-01: qty 10

## 2023-03-01 MED ORDER — PEG 3350-KCL-NA BICARB-NACL 420 G PO SOLR
4000.0000 mL | Freq: Once | ORAL | Status: AC
  Administered 2023-03-01: 4000 mL via ORAL
  Filled 2023-03-01: qty 4000

## 2023-03-01 NOTE — H&P (View-Only) (Signed)
 Subjective: Denies black stool or abdominal pain  Objective: Vital signs in last 24 hours: Temp:  [97.3 F (36.3 C)-98.6 F (37 C)] 98.6 F (37 C) (01/11 0714) Pulse Rate:  [57-72] 59 (01/11 0714) Resp:  [12-18] 16 (01/11 0714) BP: (129-169)/(62-81) 135/63 (01/11 0714) SpO2:  [98 %-100 %] 100 % (01/11 0714) Weight:  [65.3 kg] 65.3 kg (01/10 1010) Weight change:  Last BM Date : 02/27/23  PE: Not in distress GENERAL:Alert, awake, oriented x 3  ABDOMEN: Nondistended EXTREMITIES: Mild edema  Lab Results: Results for orders placed or performed during the hospital encounter of 02/25/23 (from the past 48 hours)  Magnesium      Status: Abnormal   Collection Time: 02/28/23  4:33 AM  Result Value Ref Range   Magnesium  2.5 (H) 1.7 - 2.4 mg/dL    Comment: Performed at [facility], [address], [place], [place] [address]  Renal function panel     Status: Abnormal   Collection Time: 02/28/23  4:33 AM  Result Value Ref Range   Sodium 137 135 - 145 mmol/L   Potassium 3.6 3.5 - 5.1 mmol/L   Chloride 103 98 - 111 mmol/L   CO2 21 (L) 22 - 32 mmol/L   Glucose, Bld 84 70 - 99 mg/dL    Comment: Glucose reference range applies only to samples taken after fasting for at least 8 hours.   BUN 113 (H) 8 - 23 mg/dL   Creatinine, Ser 6.80 (H) 0.44 - 1.00 mg/dL   Calcium  7.3 (L) 8.9 - 10.3 mg/dL   Phosphorus 5.5 (H) 2.5 - 4.6 mg/dL   Albumin 2.7 (L) 3.5 - 5.0 g/dL   GFR, Estimated 15 (L) >60 mL/min    Comment: (NOTE) Calculated using the CKD-EPI Creatinine Equation (2021)    Anion gap 13 5 - 15    Comment: Performed at [facility], [address], [place], [place] [address]  CBC     Status: Abnormal   Collection Time: 02/28/23  4:33 AM  Result Value Ref Range   WBC 2.7 (L) 4.0 - 10.5 K/uL   RBC 3.24 (L) 3.87 - 5.11 MIL/uL   Hemoglobin 9.0 (L) 12.0 - 15.0 g/dL   HCT 72.3 (L) 63.9 - 53.9 %   MCV 85.2 80.0 - 100.0 fL   MCH 27.8 26.0 - 34.0 pg   MCHC 32.6 30.0 - 36.0 g/dL    RDW 83.6 (H) 88.4 - 15.5 %   Platelets 169 150 - 400 K/uL   nRBC 1.5 (H) 0.0 - 0.2 %    Comment: Performed at [facility], [address], [place], [place] [address]  CBC     Status: Abnormal   Collection Time: 03/01/23  3:42 AM  Result Value Ref Range   WBC 3.0 (L) 4.0 - 10.5 K/uL   RBC 3.23 (L) 3.87 - 5.11 MIL/uL   Hemoglobin 9.0 (L) 12.0 - 15.0 g/dL   HCT 71.9 (L) 63.9 - 53.9 %   MCV 86.7 80.0 - 100.0 fL   MCH 27.9 26.0 - 34.0 pg   MCHC 32.1 30.0 - 36.0 g/dL   RDW 83.7 (H) 88.4 - 84.4 %   Platelets 190 150 - 400 K/uL   nRBC 1.0 (H) 0.0 - 0.2 %    Comment: Performed at [facility], [address], [place], [place] [address]  Renal function panel     Status: Abnormal   Collection Time: 03/01/23  3:42 AM  Result Value Ref Range   Sodium 140 135 - 145 mmol/L   Potassium 3.7 3.5 - 5.1 mmol/L   Chloride 104 98 - 111 mmol/L   CO2 24 22 - 32 mmol/L   Glucose, Bld 91 70 - 99 mg/dL    Comment: Glucose reference range applies only to samples taken after fasting for at least 8 hours.   BUN 105 (H) 8 - 23 mg/dL   Creatinine, Ser 6.88 (H) 0.44 - 1.00 mg/dL   Calcium  7.3 (L) 8.9 - 10.3 mg/dL   Phosphorus 5.0 (H) 2.5 - 4.6 mg/dL   Albumin 2.5 (L) 3.5 - 5.0 g/dL   GFR, Estimated 15 (L) >60 mL/min    Comment: (NOTE) Calculated using the CKD-EPI Creatinine Equation (2021)    Anion gap 12 5 - 15    Comment: Performed at [facility], [address] [place], [place] [address]  Magnesium      Status: None   Collection Time: 03/01/23  3:42 AM  Result Value Ref Range   Magnesium  2.4 1.7 - 2.4 mg/dL    Comment: Performed at [facility], [address] [place], [place] [address]    Studies/Results: CT ABDOMEN PELVIS WO CONTRAST Result Date: 02/28/2023 CLINICAL DATA:  Lower GI bleed. Assess for lesion, high risk for colonoscopy. EXAM: CT ABDOMEN AND PELVIS WITHOUT CONTRAST TECHNIQUE: Multidetector CT imaging of the abdomen and pelvis was performed following the  standard protocol without IV contrast. RADIATION DOSE REDUCTION: This exam was performed according to the departmental dose-optimization program which includes automated exposure control, adjustment of the mA and/or kV according to patient size and/or use of iterative reconstruction technique. COMPARISON:  CT a 01/31/2021 FINDINGS: Lower chest: Small to moderate right and small left pleural effusion. Cardiomegaly with coronary artery calcifications. Pericardial effusion is at least moderate in size. This is chronic and not significantly changed from prior exam. Decreased density of the blood pool typical of anemia. Hepatobiliary: No evidence of focal liver abnormality on this unenhanced exam. Punctate calcified granuloma in the central and left lobes of the liver. Calcified gallstone within decompressed gallbladder. No biliary dilatation. Pancreas: Not well assessed on the current exam, no evidence of pancreatic inflammation or focal lesion. Spleen: Normal in size without focal abnormality. Adrenals/Urinary Tract: The left adrenal gland is normal. The right adrenal gland is poorly defined. There is bilateral renal parenchymal atrophy. No frank hydronephrosis. Bilateral renal cysts are similar to prior. No further follow-up imaging is recommended. No bladder wall thickening. Stomach/Bowel: Small hiatal hernia. Ingested material within the stomach, no obvious gastric wall thickening. Majority of the small bowel is decompressed. No obstruction. There is no obvious lesion on this exam limited in the absence of contrast. Moderate stool throughout the:, Including stool in the rectum. No obvious colonic wall thickening or colonic mass. Vascular/Lymphatic: Advanced aortic and branch atherosclerosis. No aortic aneurysm. No bulky adenopathy. Reproductive: Hysterectomy.  No evidence of adnexal mass. Other: Small volume abdominopelvic ascites. There is marked generalized body wall edema. No free intra-abdominal air.  Musculoskeletal: Prominent lower lumbar facet hypertrophy. There are no acute or suspicious osseous abnormalities. IMPRESSION: 1. No evidence of bowel lesion or inflammation to account for anemia. Technically limited exam. 2. Gallstone without findings of acute cholecystitis. 3. Pleural effusions, minimal ascites and body wall edema, suggestive of third spacing/volume overload. 4. Additional chronic findings as described. Aortic Atherosclerosis (ICD10-I70.0). Electronically Signed   By: Andrea Gasman M.D.   On: 02/28/2023 17:20    Medications:  I have reviewed the patient's current medications.  Assessment: Melena, anemia, hemoglobin 4.6 on 02/24/2022, has received 3 unit PRBC transfusion, hemoglobin stable at 9 Iron  deficiency, iron  saturation 4%, iron  16, TIBC 388, ferritin 12 EGD 02/27/2022: Erythematous pyloric region biopsied, otherwise unremarkable  CT abdomen pelvis without contrast did not show bowel lesion/inflammation, incidental gallstone, pleural effusion, minimal ascites and body wall edema  Comorbidities: Aortic stenosis, chronic diastolic CHF, CAD-high risk patient despite relatively low risk procedure, as per cardiac risk stratification, there is nothing modifiable Chronic kidney disease stage V Chronic anemia Seizure  Plan: Patient has impaired mobility, moves around with a walker and a wheelchair, will not be able to perform an outpatient colonoscopy. Because of her comorbidities, she remains at high risk for sedation, however she came in with a hemoglobin of 4.6 without obvious cause of bleeding. Discussed about the risks and the benefits of the procedure such as colonoscopy with patient and husband at bedside. They want to proceed with a colonoscopy while patient is in the hospital. Will start patient on clear liquid diet and give colonic prep today in anticipation of colonoscopy with propofol  tomorrow.  Sherry Manas, MD 03/01/2023, 9:38 AM

## 2023-03-01 NOTE — Progress Notes (Signed)
 Subjective: Denies black stool or abdominal pain  Objective: Vital signs in last 24 hours: Temp:  [97.3 F (36.3 C)-98.6 F (37 C)] 98.6 F (37 C) (01/11 0714) Pulse Rate:  [57-72] 59 (01/11 0714) Resp:  [12-18] 16 (01/11 0714) BP: (129-169)/(62-81) 135/63 (01/11 0714) SpO2:  [98 %-100 %] 100 % (01/11 0714) Weight:  [65.3 kg] 65.3 kg (01/10 1010) Weight change:  Last BM Date : 02/27/23  PE: Not in distress GENERAL:Alert, awake, oriented x 3  ABDOMEN: Nondistended EXTREMITIES: Mild edema  Lab Results: Results for orders placed or performed during the hospital encounter of 02/25/23 (from the past 48 hours)  Magnesium      Status: Abnormal   Collection Time: 02/28/23  4:33 AM  Result Value Ref Range   Magnesium  2.5 (H) 1.7 - 2.4 mg/dL    Comment: Performed at [facility], [address], [place], [place] [address]  Renal function panel     Status: Abnormal   Collection Time: 02/28/23  4:33 AM  Result Value Ref Range   Sodium 137 135 - 145 mmol/L   Potassium 3.6 3.5 - 5.1 mmol/L   Chloride 103 98 - 111 mmol/L   CO2 21 (L) 22 - 32 mmol/L   Glucose, Bld 84 70 - 99 mg/dL    Comment: Glucose reference range applies only to samples taken after fasting for at least 8 hours.   BUN 113 (H) 8 - 23 mg/dL   Creatinine, Ser 6.80 (H) 0.44 - 1.00 mg/dL   Calcium  7.3 (L) 8.9 - 10.3 mg/dL   Phosphorus 5.5 (H) 2.5 - 4.6 mg/dL   Albumin 2.7 (L) 3.5 - 5.0 g/dL   GFR, Estimated 15 (L) >60 mL/min    Comment: (NOTE) Calculated using the CKD-EPI Creatinine Equation (2021)    Anion gap 13 5 - 15    Comment: Performed at [facility], [address], [place], [place] [address]  CBC     Status: Abnormal   Collection Time: 02/28/23  4:33 AM  Result Value Ref Range   WBC 2.7 (L) 4.0 - 10.5 K/uL   RBC 3.24 (L) 3.87 - 5.11 MIL/uL   Hemoglobin 9.0 (L) 12.0 - 15.0 g/dL   HCT 72.3 (L) 63.9 - 53.9 %   MCV 85.2 80.0 - 100.0 fL   MCH 27.8 26.0 - 34.0 pg   MCHC 32.6 30.0 - 36.0 g/dL    RDW 83.6 (H) 88.4 - 15.5 %   Platelets 169 150 - 400 K/uL   nRBC 1.5 (H) 0.0 - 0.2 %    Comment: Performed at [facility], [address], [place], [place] [address]  CBC     Status: Abnormal   Collection Time: 03/01/23  3:42 AM  Result Value Ref Range   WBC 3.0 (L) 4.0 - 10.5 K/uL   RBC 3.23 (L) 3.87 - 5.11 MIL/uL   Hemoglobin 9.0 (L) 12.0 - 15.0 g/dL   HCT 71.9 (L) 63.9 - 53.9 %   MCV 86.7 80.0 - 100.0 fL   MCH 27.9 26.0 - 34.0 pg   MCHC 32.1 30.0 - 36.0 g/dL   RDW 83.7 (H) 88.4 - 84.4 %   Platelets 190 150 - 400 K/uL   nRBC 1.0 (H) 0.0 - 0.2 %    Comment: Performed at [facility], [address], [place], [place] [address]  Renal function panel     Status: Abnormal   Collection Time: 03/01/23  3:42 AM  Result Value Ref Range   Sodium 140 135 - 145 mmol/L   Potassium 3.7 3.5 - 5.1 mmol/L   Chloride 104 98 - 111 mmol/L   CO2 24 22 - 32 mmol/L   Glucose, Bld 91 70 - 99 mg/dL    Comment: Glucose reference range applies only to samples taken after fasting for at least 8 hours.   BUN 105 (H) 8 - 23 mg/dL   Creatinine, Ser 6.88 (H) 0.44 - 1.00 mg/dL   Calcium  7.3 (L) 8.9 - 10.3 mg/dL   Phosphorus 5.0 (H) 2.5 - 4.6 mg/dL   Albumin 2.5 (L) 3.5 - 5.0 g/dL   GFR, Estimated 15 (L) >60 mL/min    Comment: (NOTE) Calculated using the CKD-EPI Creatinine Equation (2021)    Anion gap 12 5 - 15    Comment: Performed at [facility], [address] [place], [place] [address]  Magnesium      Status: None   Collection Time: 03/01/23  3:42 AM  Result Value Ref Range   Magnesium  2.4 1.7 - 2.4 mg/dL    Comment: Performed at [facility], [address] [place], [place] [address]    Studies/Results: CT ABDOMEN PELVIS WO CONTRAST Result Date: 02/28/2023 CLINICAL DATA:  Lower GI bleed. Assess for lesion, high risk for colonoscopy. EXAM: CT ABDOMEN AND PELVIS WITHOUT CONTRAST TECHNIQUE: Multidetector CT imaging of the abdomen and pelvis was performed following the  standard protocol without IV contrast. RADIATION DOSE REDUCTION: This exam was performed according to the departmental dose-optimization program which includes automated exposure control, adjustment of the mA and/or kV according to patient size and/or use of iterative reconstruction technique. COMPARISON:  CT a 01/31/2021 FINDINGS: Lower chest: Small to moderate right and small left pleural effusion. Cardiomegaly with coronary artery calcifications. Pericardial effusion is at least moderate in size. This is chronic and not significantly changed from prior exam. Decreased density of the blood pool typical of anemia. Hepatobiliary: No evidence of focal liver abnormality on this unenhanced exam. Punctate calcified granuloma in the central and left lobes of the liver. Calcified gallstone within decompressed gallbladder. No biliary dilatation. Pancreas: Not well assessed on the current exam, no evidence of pancreatic inflammation or focal lesion. Spleen: Normal in size without focal abnormality. Adrenals/Urinary Tract: The left adrenal gland is normal. The right adrenal gland is poorly defined. There is bilateral renal parenchymal atrophy. No frank hydronephrosis. Bilateral renal cysts are similar to prior. No further follow-up imaging is recommended. No bladder wall thickening. Stomach/Bowel: Small hiatal hernia. Ingested material within the stomach, no obvious gastric wall thickening. Majority of the small bowel is decompressed. No obstruction. There is no obvious lesion on this exam limited in the absence of contrast. Moderate stool throughout the:, Including stool in the rectum. No obvious colonic wall thickening or colonic mass. Vascular/Lymphatic: Advanced aortic and branch atherosclerosis. No aortic aneurysm. No bulky adenopathy. Reproductive: Hysterectomy.  No evidence of adnexal mass. Other: Small volume abdominopelvic ascites. There is marked generalized body wall edema. No free intra-abdominal air.  Musculoskeletal: Prominent lower lumbar facet hypertrophy. There are no acute or suspicious osseous abnormalities. IMPRESSION: 1. No evidence of bowel lesion or inflammation to account for anemia. Technically limited exam. 2. Gallstone without findings of acute cholecystitis. 3. Pleural effusions, minimal ascites and body wall edema, suggestive of third spacing/volume overload. 4. Additional chronic findings as described. Aortic Atherosclerosis (ICD10-I70.0). Electronically Signed   By: Andrea Gasman M.D.   On: 02/28/2023 17:20    Medications:  I have reviewed the patient's current medications.  Assessment: Melena, anemia, hemoglobin 4.6 on 02/24/2022, has received 3 unit PRBC transfusion, hemoglobin stable at 9 Iron  deficiency, iron  saturation 4%, iron  16, TIBC 388, ferritin 12 EGD 02/27/2022: Erythematous pyloric region biopsied, otherwise unremarkable  CT abdomen pelvis without contrast did not show bowel lesion/inflammation, incidental gallstone, pleural effusion, minimal ascites and body wall edema  Comorbidities: Aortic stenosis, chronic diastolic CHF, CAD-high risk patient despite relatively low risk procedure, as per cardiac risk stratification, there is nothing modifiable Chronic kidney disease stage V Chronic anemia Seizure  Plan: Patient has impaired mobility, moves around with a walker and a wheelchair, will not be able to perform an outpatient colonoscopy. Because of her comorbidities, she remains at high risk for sedation, however she came in with a hemoglobin of 4.6 without obvious cause of bleeding. Discussed about the risks and the benefits of the procedure such as colonoscopy with patient and husband at bedside. They want to proceed with a colonoscopy while patient is in the hospital. Will start patient on clear liquid diet and give colonic prep today in anticipation of colonoscopy with propofol  tomorrow.  Sherry Manas, MD 03/01/2023, 9:38 AM

## 2023-03-01 NOTE — Plan of Care (Signed)

## 2023-03-01 NOTE — Progress Notes (Addendum)
 Subjective:  Sherry [name] is a 78 y.o. person living with a past medical history of severe aortic stenosis, CKD stage V, chronic anemia, seizure disorder who originally presented for stomach pain in the [facility] and admitted for symptomatic anemia.   Patient reports feeling well today. She has not had a BM today, she denies BRBPR. She wants to move forward with getting the colonoscopy.   Objective:  Vital signs in last 24 hours: Vitals:   02/28/23 1236 02/28/23 1545 02/28/23 2130 03/01/23 0600  BP: (!) 169/77 (!) 147/62 (!) 154/81 129/63  Pulse: 72 (!) 57 63 60  Resp: 18 18 18 18   Temp: 98.3 F (36.8 C) 97.9 F (36.6 C) 98.6 F (37 C) 98.4 F (36.9 C)  TempSrc: Oral Oral Oral Oral  SpO2: 98% 99% 99% 98%  Weight:      Height:       Physical Exam: General:NAD, laying in bed, husband at bedside  Cardiac:RRR, loud systolic murmur that radiates to the bilateral carotids  Pulmonary:normal effort on RA Abdominal: Soft, no tenderness to palpation  Neuro:awake, alert, participating in conversation  Skin:warm and dry  Psych:  normal mood and affect, pleasant      Latest Ref Rng & Units 03/01/2023    3:42 AM 02/28/2023    4:33 AM 02/27/2023    4:14 AM  CBC  WBC 4.0 - 10.5 K/uL 3.0  2.7  2.7   Hemoglobin 12.0 - 15.0 g/dL 9.0  9.0  8.6   Hematocrit 36.0 - 46.0 % 28.0  27.6  26.2   Platelets 150 - 400 K/uL 190  169  170        Latest Ref Rng & Units 03/01/2023    3:42 AM 02/28/2023    4:33 AM 02/27/2023    4:14 AM  BMP  Glucose 70 - 99 mg/dL 91  84  80   BUN 8 - 23 mg/dL 894  886  880   Creatinine 0.44 - 1.00 mg/dL 6.88  6.80  6.76   Sodium 135 - 145 mmol/L 140  137  140   Potassium 3.5 - 5.1 mmol/L 3.7  3.6  3.3   Chloride 98 - 111 mmol/L 104  103  106   CO2 22 - 32 mmol/L 24  21  21    Calcium  8.9 - 10.3 mg/dL 7.3  7.3  7.4      Assessment/Plan:  Principal Problem:   Symptomatic anemia Active Problems:   Aortic stenosis   ASCVD (arteriosclerotic cardiovascular  disease)   Seizures (HCC)   Melena   CKD (chronic kidney disease) stage 5, GFR less than 15 ml/min (HCC)   Declining functional status   Uremia   Coronary artery disease involving coronary bypass graft of native heart without angina pectoris   Hx of transient ischemic attack (TIA)   Chronic heart failure with preserved ejection fraction (HCC)   Second degree type I atrioventricular block   First degree AV block   Severe aortic stenosis   Preprocedural cardiovascular examination   Pressure injury of sacral region, stage 2 (HCC)  Symptomatic anemia Acute on Chronic Anemia Patient has severe aortic stenosis, 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 Hemoglobin is stable at 9.0 today. No further clinical evidence of bleeding. GI is consulted, EGD did not reveal a source of the bleed, CT did not reveal source of bleed, moving forward with colonoscopy tomorrow.  Plan: -Colonoscopy tomorrow  -Clear liquid diet, NPO midnight  -IV PPI 40mg  BID  -  IV iron  200 mg venofer    Severe Aortic Stenosis HFpEF Systolic murmur that radiates to bilateral carotids severe stenosis noted on echocardiogram. Cardiology was consulted and deemed patient high risk with cardiac history.  Plan:  -Continue torsemide  60mg  daily   Pericardial Effusion, moderate CT imaging showed a moderate pericardial effusion.  Patient also had a small-moderate pericardial effusion present on echocardiogram in July.  Patient is volume overloaded with severe aortic stenosis and CKD stage V, pericardial effusion is most likely due to volume overload.  Will continue to monitor clinically.   ASCVD (arteriosclerotic cardiovascular disease) History of lacunar cerebral infarctions and coronary artery disease.   -Will restart aspirin  81 mg  -Continue Crestor  40 mg    Seizures (HCC) History of seizures.  Takes nightly phenobarbital  and zonisamide  for this.  Will continue this medicine.  CKD (chronic kidney disease) stage 5, GFR less  than 15 ml/min (HCC) Uremia  Stable GFR, still consistent with CKD 5 range. little bit volume overloaded but respiratory status is okay.  I do wonder if the degree of uremia is because of bleeding plus impaired filtration at the level of the kidneys.  I do not think that there is an indication for urgent dialysis. -Continue to monitor with BMP  -Continue with home oscal, renvela   Declining functional status Impaired mobility.  Moves around with a walker and sometimes a wheelchair.  Tries to do some light housework to help out her husband but has not been able to do much of this recently.     -PT/OT   Stage 2 Pressure Ulcer, POA -WOC consulted   Resolved Problems:   __________________________________ VTE Prophylaxis:SCDs  Diet: CLD PCQ:Wnwz  Barriers to Discharge:Medical Therapy  Dispo: Anticipated discharge in approximately 1-2 day(s).   Kandis Perkins, DO 03/01/2023, 7:01 AM Pager: [phone] After 5pm on weekdays and 1pm on weekends: On Call pager [phone]

## 2023-03-02 ENCOUNTER — Encounter (HOSPITAL_COMMUNITY)
Admission: EM | Disposition: A | Discharge status: Home Health | Payer: Self-pay | Source: Home / Self Care | Attending: Internal Medicine

## 2023-03-02 ENCOUNTER — Inpatient Hospital Stay (HOSPITAL_COMMUNITY): Payer: 59 | Admitting: Certified Registered Nurse Anesthetist

## 2023-03-02 ENCOUNTER — Encounter (HOSPITAL_COMMUNITY): Payer: Self-pay | Admitting: Internal Medicine

## 2023-03-02 DIAGNOSIS — I2510 Atherosclerotic heart disease of native coronary artery without angina pectoris: Secondary | ICD-10-CM

## 2023-03-02 DIAGNOSIS — D123 Benign neoplasm of transverse colon: Secondary | ICD-10-CM

## 2023-03-02 DIAGNOSIS — Z87891 Personal history of nicotine dependence: Secondary | ICD-10-CM

## 2023-03-02 DIAGNOSIS — D649 Anemia, unspecified: Secondary | ICD-10-CM | POA: Diagnosis not present

## 2023-03-02 DIAGNOSIS — K644 Residual hemorrhoidal skin tags: Secondary | ICD-10-CM | POA: Diagnosis not present

## 2023-03-02 DIAGNOSIS — I5032 Chronic diastolic (congestive) heart failure: Secondary | ICD-10-CM | POA: Diagnosis not present

## 2023-03-02 DIAGNOSIS — N185 Chronic kidney disease, stage 5: Secondary | ICD-10-CM | POA: Diagnosis not present

## 2023-03-02 DIAGNOSIS — K921 Melena: Secondary | ICD-10-CM

## 2023-03-02 DIAGNOSIS — I350 Nonrheumatic aortic (valve) stenosis: Secondary | ICD-10-CM | POA: Diagnosis not present

## 2023-03-02 HISTORY — PX: POLYPECTOMY: SHX5525

## 2023-03-02 HISTORY — PX: COLONOSCOPY WITH PROPOFOL: SHX5780

## 2023-03-02 LAB — CBC
HCT: 31.5 % — ABNORMAL LOW (ref 36.0–46.0)
Hemoglobin: 9.9 g/dL — ABNORMAL LOW (ref 12.0–15.0)
MCH: 27.2 pg (ref 26.0–34.0)
MCHC: 31.4 g/dL (ref 30.0–36.0)
MCV: 86.5 fL (ref 80.0–100.0)
Platelets: 209 10*3/uL (ref 150–400)
RBC: 3.64 MIL/uL — ABNORMAL LOW (ref 3.87–5.11)
RDW: 16.1 % — ABNORMAL HIGH (ref 11.5–15.5)
WBC: 3.2 10*3/uL — ABNORMAL LOW (ref 4.0–10.5)
nRBC: 0.9 % — ABNORMAL HIGH (ref 0.0–0.2)

## 2023-03-02 LAB — RENAL FUNCTION PANEL
Albumin: 2.6 g/dL — ABNORMAL LOW (ref 3.5–5.0)
Anion gap: 13 (ref 5–15)
BUN: 88 mg/dL — ABNORMAL HIGH (ref 8–23)
CO2: 22 mmol/L (ref 22–32)
Calcium: 7.4 mg/dL — ABNORMAL LOW (ref 8.9–10.3)
Chloride: 103 mmol/L (ref 98–111)
Creatinine, Ser: 2.88 mg/dL — ABNORMAL HIGH (ref 0.44–1.00)
GFR, Estimated: 16 mL/min — ABNORMAL LOW (ref >60)
Glucose, Bld: 105 mg/dL — ABNORMAL HIGH (ref 70–99)
Phosphorus: 4.4 mg/dL (ref 2.5–4.6)
Potassium: 3.3 mmol/L — ABNORMAL LOW (ref 3.5–5.1)
Sodium: 138 mmol/L (ref 135–145)

## 2023-03-02 SURGERY — COLONOSCOPY WITH PROPOFOL
Anesthesia: Monitor Anesthesia Care

## 2023-03-02 MED ORDER — PROPOFOL 10 MG/ML IV BOLUS
INTRAVENOUS | Status: DC | PRN
  Administered 2023-03-02: 20 mg via INTRAVENOUS

## 2023-03-02 MED ORDER — PHENYLEPHRINE 80 MCG/ML (10ML) SYRINGE FOR IV PUSH (FOR BLOOD PRESSURE SUPPORT)
PREFILLED_SYRINGE | INTRAVENOUS | Status: DC | PRN
  Administered 2023-03-02 (×5): 80 ug via INTRAVENOUS

## 2023-03-02 MED ORDER — PANTOPRAZOLE SODIUM 40 MG PO TBEC
40.0000 mg | DELAYED_RELEASE_TABLET | Freq: Two times a day (BID) | ORAL | Status: DC
  Filled 2023-03-02: qty 1

## 2023-03-02 MED ORDER — EPHEDRINE SULFATE-NACL 50-0.9 MG/10ML-% IV SOSY
PREFILLED_SYRINGE | INTRAVENOUS | Status: DC | PRN
  Administered 2023-03-02 (×2): 5 mg via INTRAVENOUS

## 2023-03-02 MED ORDER — PANTOPRAZOLE SODIUM 40 MG PO TBEC: 40.0000 mg | DELAYED_RELEASE_TABLET | Freq: Every day | ORAL | Status: DC

## 2023-03-02 MED ORDER — POTASSIUM CHLORIDE 10 MEQ/100ML IV SOLN: 10.0000 meq | INTRAVENOUS | Status: DC

## 2023-03-02 MED ORDER — PROPOFOL 500 MG/50ML IV EMUL
INTRAVENOUS | Status: DC | PRN
  Administered 2023-03-02: 180 ug/kg/min via INTRAVENOUS

## 2023-03-02 MED ORDER — LIDOCAINE 2% (20 MG/ML) 5 ML SYRINGE
INTRAMUSCULAR | Status: DC | PRN
  Administered 2023-03-02: 40 mg via INTRAVENOUS

## 2023-03-02 MED ORDER — SODIUM CHLORIDE 0.9 % IV SOLN
INTRAVENOUS | Status: DC | PRN
Start: 2023-03-02 — End: 2023-03-02

## 2023-03-02 MED ORDER — POTASSIUM CHLORIDE CRYS ER 20 MEQ PO TBCR
40.0000 meq | EXTENDED_RELEASE_TABLET | Freq: Once | ORAL | Status: AC
  Administered 2023-03-02: 40 meq via ORAL
  Filled 2023-03-02: qty 2

## 2023-03-02 SURGICAL SUPPLY — 20 items
ELECT REM PT RETURN 9FT ADLT (ELECTROSURGICAL)
ELECTRODE REM PT RTRN 9FT ADLT (ELECTROSURGICAL) IMPLANT
FCP BXJMBJMB 240X2.8X (CUTTING FORCEPS)
FLOOR PAD 36X40 (MISCELLANEOUS) ×2
FORCEPS BIOP RAD 4 LRG CAP 4 (CUTTING FORCEPS) IMPLANT
FORCEPS BXJMBJMB 240X2.8X (CUTTING FORCEPS) IMPLANT
INJECTOR/SNARE I SNARE (MISCELLANEOUS) IMPLANT
LUBRICANT JELLY 4.5OZ STERILE (MISCELLANEOUS) IMPLANT
MANIFOLD NEPTUNE II (INSTRUMENTS) IMPLANT
NDL SCLEROTHERAPY 25GX240 (NEEDLE) IMPLANT
NEEDLE SCLEROTHERAPY 25GX240 (NEEDLE) IMPLANT
PAD FLOOR 36X40 (MISCELLANEOUS) ×2 IMPLANT
PROBE APC STR FIRE (PROBE) IMPLANT
PROBE INJECTION GOLD 7FR (MISCELLANEOUS) IMPLANT
SNARE ROTATE MED OVAL 20MM (MISCELLANEOUS) IMPLANT
SYR 50ML LL SCALE MARK (SYRINGE) IMPLANT
TRAP SPECIMEN MUCOUS 40CC (MISCELLANEOUS) IMPLANT
TUBING ENDO SMARTCAP PENTAX (MISCELLANEOUS) IMPLANT
TUBING IRRIGATION ENDOGATOR (MISCELLANEOUS) ×2 IMPLANT
WATER STERILE IRR 1000ML POUR (IV SOLUTION) IMPLANT

## 2023-03-02 NOTE — Anesthesia Preprocedure Evaluation (Addendum)
 Anesthesia Evaluation  Patient identified by MRN, date of birth, ID band Patient awake    Reviewed: Allergy & Precautions, NPO status , Patient's Chart, lab work & pertinent test results  Airway Mallampati: II  TM Distance: >3 FB Neck ROM: Full    Dental no notable dental hx.    Pulmonary former smoker   Pulmonary exam normal breath sounds clear to auscultation       Cardiovascular hypertension, Pt. on medications + CAD and +CHF  + Valvular Problems/Murmurs AS  Rhythm:Regular Rate:Normal + Systolic murmurs ECHO: 1. Normal LV function; severe LVH, consider amyloid; calcified aortic  valve with probable severe AS (mean gradient 24 mmHg; AVA 0.9 cm2); small  to moderate pericardial effusion.   2. Left ventricular ejection fraction, by estimation, is 60 to 65%. The  left ventricle has normal function. The left ventricle has no regional  wall motion abnormalities. There is severe left ventricular hypertrophy.  Left ventricular diastolic parameters   are indeterminate. Elevated left atrial pressure.   3. Right ventricular systolic function is normal. The right ventricular  size is normal.   4. Left atrial size was moderately dilated.   5. A small pericardial effusion is present.   6. The mitral valve is normal in structure. Mild mitral valve  regurgitation. No evidence of mitral stenosis. Severe mitral annular  calcification.   7. The aortic valve is calcified. Aortic valve regurgitation is trivial.  Severe aortic valve stenosis.   8. The inferior vena cava is normal in size with greater than 50%  respiratory variability, suggesting right atrial pressure of 3 mmHg.     Neuro/Psych  Headaches, Seizures -,  TIACVA  negative psych ROS   GI/Hepatic Neg liver ROS, Bowel prep,GERD  Medicated and Controlled,,  Endo/Other  negative endocrine ROS    Renal/GU CRFRenal disease     Musculoskeletal  (+) Arthritis ,  Limited mobility    Abdominal   Peds  Hematology  (+) Blood dyscrasia, anemia   Anesthesia Other Findings Severe anemia, Hb 4.6, egd unremarkable  Reproductive/Obstetrics                             Anesthesia Physical Anesthesia Plan  ASA: 4  Anesthesia Plan: MAC   Post-op Pain Management:    Induction:   PONV Risk Score and Plan: 2 and Propofol  infusion and Treatment may vary due to age or medical condition  Airway Management Planned: Simple Face Mask  Additional Equipment:   Intra-op Plan:   Post-operative Plan:   Informed Consent: I have reviewed the patients History and Physical, chart, labs and discussed the procedure including the risks, benefits and alternatives for the proposed anesthesia with the patient or authorized representative who has indicated his/her understanding and acceptance.       Plan Discussed with: CRNA  Anesthesia Plan Comments:         Anesthesia Quick Evaluation

## 2023-03-02 NOTE — Discharge Instructions (Addendum)
 You came to the hospital for low blood count and you were diagnosed with anemia.  We treated you with IV iron  and IV Protonix .  *For your anemia  -These continue to take Protonix  40 mg daily -It is very important that you follow-up with your gastroenterologist (stomach doctor).  You may be a candidate for additional testing to find the source of your bleeding called with a capsule endoscopy, you will discuss this with your gastroenterologist. -Please schedule follow-up with your PCP -Continue to avoid NSAID use and alcohol use   *For your Heart failure -We have stopped the following medications:  -Lasix  (furosemide ) you are currently on a regimen of torsemide    -Please continue to visit your cardiologist for treatment of your heart failure  -Continue to take torsemide  60 mg daily  Follow-up appointments: Please visit your family doctor in 7 to 10 days Please continue to visit your cardiologist Please schedule appoint with your gastroenterologist in 2 to 4 weeks of the soonest available appointment  If you have any questions or concerns please feel free to call: Internal medicine clinic at [phone]   If you have any of these following symptoms, please call us  or seek care at an emergency department: -Chest Pain -Difficulty Breathing -Worsening abdominal  pain -Syncope (passing out) -Drooping of face -Slurred speech -Sudden weakness in your leg or arm -Please either contact your PCP if you begin to feel extremely fatigued and weak as well as Bright red blood in the toilet bowl, black tarry stool    We are glad that you are feeling better, it was a pleasure to care for you!  Damien Lease DO

## 2023-03-02 NOTE — Discharge Summary (Addendum)
 Name: Sherry [name] MRN: [id number] DOB: 04/24/1945 78 y.o. PCP: Fernand Prost, MD  Date of Admission: 02/25/2023  2:16 PM Date of Discharge: 03/02/2023 12:59 PM Attending Physician: Dr. Karna  Discharge Diagnosis: Principal Problem:   Symptomatic anemia Active Problems:   Aortic stenosis   ASCVD (arteriosclerotic cardiovascular disease)   Seizures (HCC)   Melena   CKD (chronic kidney disease) stage 5, GFR less than 15 ml/min (HCC)   Declining functional status   Uremia   Coronary artery disease involving coronary bypass graft of native heart without angina pectoris   Hx of transient ischemic attack (TIA)   Chronic heart failure with preserved ejection fraction (HCC)   Second degree type I atrioventricular block   First degree AV block   Severe aortic stenosis   Preprocedural cardiovascular examination   Pressure injury of sacral region, stage 2 (HCC)   Pericardial effusion    Discharge Medications: Allergies as of 03/02/2023       Reactions   Ibuprofen Other (See Comments)   Dr advised pt not to take ibuprofen   Lactose Intolerance (gi) Diarrhea        Medication List     STOP taking these medications    furosemide  20 MG tablet Commonly known as: LASIX        TAKE these medications    acetaminophen  325 MG tablet Commonly known as: TYLENOL  Take 2 tablets (650 mg total) by mouth every 6 (six) hours as needed for mild pain (or Fever >/= 101).   albuterol  (2.5 MG/3ML) 0.083% nebulizer solution Commonly known as: PROVENTIL  Take 3 mLs (2.5 mg total) by nebulization every 6 (six) hours as needed for wheezing.   Aspirin  Low Dose 81 MG chewable tablet Generic drug: aspirin  Chew 1 tablet (81 mg total) by mouth daily.   calcium  carbonate 1500 (600 Ca) MG Tabs tablet Commonly known as: OSCAL Take 1 tablet (1,500 mg total) by mouth 3 (three) times daily with meals.   cholecalciferol  25 MCG (1000 UNIT) tablet Commonly known as: VITAMIN D3 Take 1 tablet  (1,000 Units total) by mouth daily.   febuxostat  40 MG tablet Commonly known as: ULORIC  Take 20 mg by mouth daily.   hydrALAZINE  100 MG tablet Commonly known as: APRESOLINE  TAKE 1 TABLET(100 MG) BY MOUTH THREE TIMES DAILY   Jardiance  10 MG Tabs tablet Generic drug: empagliflozin  TAKE 1 TABLET BY MOUTH DAILY  BEFORE BREAKFAST   lidocaine  4 % cream Commonly known as: LMX Apply 1 Application topically 2 (two) times daily as needed (Pain on sacral wound area).   multivitamin Tabs tablet Take 1 tablet by mouth at bedtime.   pantoprazole  40 MG tablet Commonly known as: PROTONIX  TAKE 1 TABLET BY MOUTH DAILY   PHENobarbital  64.8 MG tablet Commonly known as: LUMINAL Take 2 tablets (129.6 mg total) by mouth at bedtime.   potassium chloride  SA 20 MEQ tablet Commonly known as: KLOR-CON  M Take 1 tablet (20 mEq total) by mouth daily.   rosuvastatin  40 MG tablet Commonly known as: CRESTOR  Take 1 tablet (40 mg total) by mouth daily.   senna-docusate 8.6-50 MG tablet Commonly known as: Senokot S Take 2 tablets by mouth daily.   sevelamer  carbonate 800 MG tablet Commonly known as: RENVELA  Take 1 tablet (800 mg total) by mouth 3 (three) times daily with meals.   torsemide  20 MG tablet Commonly known as: DEMADEX  Take 3 tablets (60 mg total) by mouth daily. TAKE 2 TABLETS BY MOUTH DAILY AND AN EXTRA DOSE(2 TABLETS)  IN THE PM FOR SWELLING OR SHORTNESS OF BREATH   zonisamide  100 MG capsule Commonly known as: ZONEGRAN  TAKE 2 CAPSULES BY MOUTH AT NIGHT               Durable Medical Equipment  (From admission, onward)           Start     Ordered   02/27/23 1512  For home use only DME Bedside commode  Once       Comments: With drop down arm  Question:  Patient needs a bedside commode to treat with the following condition  Answer:  Weakness   02/27/23 1511              Discharge Care Instructions  (From admission, onward)           Start     Ordered    03/02/23 0000  Discharge wound care:       Comments: See wound care above   03/02/23 1242            Disposition and follow-up:   Sherry [name] was discharged from [facility] in Stable condition.  At the hospital follow up visit please address:  1.  Follow-up:  *Please note that patient changed her DNR/DNI status to full code while hospitalized* -Assess whether she wishes to remain full code at appointment.   *Anemia, suspected GI bleed of some source -Please note that both endoscopy and colonoscopy did not reveal source of bleed.  Patient was instructed to follow-up with [facility] gastroenterology for a possible outpatient capsule endoscopy to assess for AVMs or bleeding lesions in the small intestine. -Please ensure that patient follows up with [facility] gastroenterology -Please note that patient was instructed to continue Protonix  40 mg daily -Please note that patient received a total of 600 mg of IV Venofer  during hospitalization -Repeat CBC  *Severe aortic stenosis *HFpEF *Pericardial effusion, moderate -Encourage patient to follow-up with cardiologist -Please note that patient was taking both torsemide  60 mg and Lasix  60 mg, we have discontinue Lasix  60 mg.  *CKD stage V -Encourage patient to follow-up with nephrology -Repeat BMP at follow-up -Ensure patient has an appointment with nutrition Lessie) for information on a renal diet.    2.  Labs / imaging needed at time of follow-up: CBC, BMP  3.  Pending labs/ test needing follow-up: Endoscopy and colonoscopy biopsies  4.  Medication Changes  STOPPED  -Lasix  60 mg daily   ADDED  -N/A    MODIFIED  -N/A   Hospital Course by problem list: Principal Problem:   Symptomatic anemia Active Problems:   Aortic stenosis   ASCVD (arteriosclerotic cardiovascular disease)   Seizures (HCC)   Melena   CKD (chronic kidney disease) stage 5, GFR less than 15 ml/min (HCC)   Declining functional  status   Uremia   Coronary artery disease involving coronary bypass graft of native heart without angina pectoris   Hx of transient ischemic attack (TIA)   Chronic heart failure with preserved ejection fraction (HCC)   Second degree type I atrioventricular block   First degree AV block   Severe aortic stenosis   Preprocedural cardiovascular examination   Pressure injury of sacral region, stage 2 (HCC)   Pericardial effusion  Resolved Problems:   * No resolved hospital problems. *   Acute on Chronic Anemia Patient presented with a hemoglobin of 4.6 which was suspected to be due to chronic upper GI blood loss with symptoms of 3  weeks of melena.  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  She received 3 units of PRBCs and her hemoglobin remained stable throughout her hospitalization.  Patient also received recommend IV iron  supplementation while hospitalized.  An EGD revealed localized moderately erythematous mucosa without bleeding was found in the prepyloric region. An CT of abdomen/pelvis did not reveal a lesion or inflammation to account for the anemia. A colonoscopy did not show active bleeding or a recent source of bleeding.  Polyps were removed and sent for biopsy.   Severe Aortic Stenosis Systolic murmur that radiates to bilateral carotids severe stenosis noted on echocardiogram. Cardiology was consulted and deemed patient high risk with cardiac history. Her risk factors are unable to modified; GI and family decided to proceed with endoscopy.  Patient was continued on her torsemide  60 mg daily throughout her hospitalization, we instructed her to discontinue Lasix  on discharge. It appears that she was taking both Lasix  and torsemide  simultaneously.   ASCVD (arteriosclerotic cardiovascular disease) She was continued on her Crestor  40 mg hospitalized.  Her aspirin  was initially held and restarted.  Seizures (HCC) History of seizures.  Takes nightly  phenobarbital  and zonisamide  for this.  Continued this home medication.    CKD (chronic kidney disease) stage 5, GFR less than 15 ml/min (HCC) Stable GFR, still consistent with CKD 5 range.  She did not require an urgent need for dialysis during his hospitalization.  We continued her home medications of Oscal and Renvela .  Declining functional status Impaired mobility.  Moves around with a walker and sometimes a wheelchair.  Tries to do some light housework to help out her husband but has not been able to do much of this recently.  Health for physical therapy and Occupational Therapy was ordered on discharge  Pericardial Effusion, moderate CT imaging showed a moderate pericardial effusion.  Patient also had a small-moderate pericardial effusion present on echocardiogram in July.  Patient was volume overloaded with severe aortic stenosis and CKD stage V, pericardial effusion is most likely due to volume overload.     Uremia BUN was 88 on the day of discharge.  Appears that she has chronic uremia and prior labs.  As above, patient did not require emergent dialysis throughout hospitalization.  Incidental image findings: -Punctate calcified granuloma in the central and left lobes of the liver.  -Calcified gallstone within decompressed gallbladder -Bilateral renal cysts are similar to prior    Discharge Subjective: She reports feeling well today.  She denies recurrent bleeding.  She denies abdominal pain at this time.  Denies any acute complaints today is looking forward to going home.  Husband is at bedside.   Blood pressure 128/78, pulse 72, temperature (!) 97.4 F (36.3 C), temperature source Oral, resp. rate 16, height 5' 1 (1.549 m), weight 65.3 kg, SpO2 99%.  Constitutional: Chronically ill-appearing female, laying in bed, no acute distress Cardiovascular: regular rate and rhythm, loud systolic murmur radiating to the bilateral carotids Pulmonary/Chest: normal work of breathing on room  air, no tachypnea.  Minimal bibasilar expiratory wheezing auscultated on exam, no crackles auscultated Abdominal: soft, non-tender, non-distended Neurological: alert & oriented x 3 Skin: warm and dry Psych: Normal mood and affect  Pertinent Labs, Studies, and Procedures:     Latest Ref Rng & Units 03/02/2023    3:00 AM 03/01/2023    3:42 AM 02/28/2023    4:33 AM  CBC  WBC 4.0 - 10.5 K/uL 3.2  3.0  2.7  Hemoglobin 12.0 - 15.0 g/dL 9.9  9.0  9.0   Hematocrit 36.0 - 46.0 % 31.5  28.0  27.6   Platelets 150 - 400 K/uL 209  190  169        Latest Ref Rng & Units 03/02/2023    3:00 AM 03/01/2023    3:42 AM 02/28/2023    4:33 AM  CMP  Glucose 70 - 99 mg/dL 894  91  84   BUN 8 - 23 mg/dL 88  894  886   Creatinine 0.44 - 1.00 mg/dL 7.11  6.88  6.80   Sodium 135 - 145 mmol/L 138  140  137   Potassium 3.5 - 5.1 mmol/L 3.3  3.7  3.6   Chloride 98 - 111 mmol/L 103  104  103   CO2 22 - 32 mmol/L 22  24  21    Calcium  8.9 - 10.3 mg/dL 7.4  7.3  7.3     No results found.   Discharge Instructions: Discharge Instructions     Call MD for:  difficulty breathing, headache or visual disturbances   Complete by: As directed    Call MD for:  extreme fatigue   Complete by: As directed    Call MD for:  hives   Complete by: As directed    Call MD for:  persistant dizziness or light-headedness   Complete by: As directed    Call MD for:  persistant nausea and vomiting   Complete by: As directed    Call MD for:  redness, tenderness, or signs of infection (pain, swelling, redness, odor or green/yellow discharge around incision site)   Complete by: As directed    Call MD for:  severe uncontrolled pain   Complete by: As directed    Call MD for:  temperature >100.4   Complete by: As directed    Diet - low sodium heart healthy   Complete by: As directed    Discharge instructions   Complete by: As directed    You came to the hospital for low blood count and you were diagnosed with anemia.  We treated  you with IV iron  and IV Protonix .  *For your anemia  -These continue to take Protonix  40 mg daily -It is very important that you follow-up with your gastroenterologist (stomach doctor).  You may be a candidate for additional testing to find the source of your bleeding called with a capsule endoscopy, you will discuss this with your gastroenterologist. -Please schedule follow-up with your PCP   *For your Heart failure -We have stopped the following medications:  -Lasix  (furosemide ) you are currently on a regimen of torsemide    -Please continue to visit your cardiologist for treatment of your heart failure  -Continue to take torsemide  60 mg daily  Follow-up appointments: Please visit your family doctor in 7 to 10 days Please continue to visit your cardiologist Please schedule appoint with your gastroenterologist in 2 to 4 weeks of the soonest available appointment  If you have any questions or concerns please feel free to call: Internal medicine clinic at [phone]   If you have any of these following symptoms, please call us  or seek care at an emergency department: -Chest Pain -Difficulty Breathing -Worsening abdominal  pain -Syncope (passing out) -Drooping of face -Slurred speech -Sudden weakness in your leg or arm -Please either contact your PCP if you begin to feel extremely fatigued and weak as well as seeing Bright red blood in the toilet bowl, black tarry stool  We are glad that you are feeling better, it was a pleasure to care for you!  Damien Lease DO   Discharge wound care:   Complete by: As directed    See wound care above   Increase activity slowly   Complete by: As directed    No wound care   Complete by: As directed        Signed: Damien Lease DO [facility] - PGY1 Pager: [phone] 03/02/2023, 12:59 PM    Please contact the on call pager after 5 pm and on weekends at [phone].

## 2023-03-02 NOTE — Anesthesia Postprocedure Evaluation (Signed)
 Anesthesia Post Note  Patient: Sherry [name]  Procedure(s) Performed: COLONOSCOPY WITH PROPOFOL  POLYPECTOMY     Patient location during evaluation: PACU Anesthesia Type: MAC Level of consciousness: awake Pain management: pain level controlled Vital Signs Assessment: post-procedure vital signs reviewed and stable Respiratory status: spontaneous breathing, nonlabored ventilation and respiratory function stable Cardiovascular status: blood pressure returned to baseline and stable Postop Assessment: no apparent nausea or vomiting Anesthetic complications: no   No notable events documented.  Last Vitals:  Vitals:   03/02/23 1145 03/02/23 1202  BP: (!) 160/70 128/78  Pulse: 69 72  Resp: 11 16  Temp: (!) 36.2 C (!) 36.3 C  SpO2: 98% 99%    Last Pain:  Vitals:   03/02/23 1202  TempSrc: Oral  PainSc:                  Enna Warwick P Prem Coykendall

## 2023-03-02 NOTE — Plan of Care (Signed)

## 2023-03-02 NOTE — Interval H&P Note (Signed)
 History and Physical Interval Note: 79 year old female with melena, anemia, hemoglobin 4.7 on 02/24/2022 with unremarkable EGD and unremarkable CAT scan for a colonoscopy with propofol .  03/02/2023 10:24 AM  Sherry [name]  has presented today for colonoscopy with propofol , with the diagnosis of Severe anemia, Hb 4.6, egd unremarkable.  The various methods of treatment have been discussed with the patient and family. After consideration of risks, benefits and other options for treatment, the patient has consented to  Procedure(s): COLONOSCOPY WITH PROPOFOL  (N/A) as a surgical intervention.  The patient's history has been reviewed, patient examined, no change in status, stable for surgery.  I have reviewed the patient's chart and labs.  Questions were answered to the patient's satisfaction.     Sherry [name]

## 2023-03-02 NOTE — TOC Transition Note (Addendum)
 Transition of Care [facility]) - Discharge Note   Patient Details  Name: Sherry [name] MRN: [id number] Date of Birth: November 04, 1945  [facility] CM/SW Contact:  Marval Gell, RN Phone Number: 03/02/2023, 1:00 PM   Clinical Narrative:     Spoke w patient's daughter, she will need PTAR home, forms sent up to unit and PTAR called for pick up at 2pm.  Address verified.  HH agency notified of DC  PCS forms faxed   Final next level of care: Home w Home Health Services Barriers to Discharge: No Barriers Identified   Patient Goals and CMS Choice Patient states their goals for this hospitalization and ongoing recovery are:: return home with home health CMS Medicare.gov Compare Post Acute Care list provided to:: Patient Choice offered to / list presented to : Patient      Discharge Placement                       Discharge Plan and Services Additional resources added to the After Visit Summary for     Discharge Planning Services: CM Consult Post Acute Care Choice: Home Health          DME Arranged: Bedside commode DME Agency: [employer] Date DME Agency Contacted: 02/27/23 Time DME Agency Contacted: 1517 Representative spoke with at DME Agency: [place] [facility] Arranged: PT          Social Drivers of Health (SDOH) Interventions SDOH Screenings   Food Insecurity: No Food Insecurity (02/25/2023)  Housing: Low Risk  (02/25/2023)  Transportation Needs: No Transportation Needs (02/25/2023)  Utilities: Not At Risk (02/25/2023)  Alcohol Screen: Low Risk  (09/18/2022)  Depression (PHQ2-9): Low Risk  (02/25/2023)  Financial Resource Strain: Low Risk  (09/18/2022)  Physical Activity: Inactive (09/18/2022)  Social Connections: Moderately Isolated (02/25/2023)  Stress: No Stress Concern Present (09/18/2022)  Tobacco Use: Medium Risk (03/02/2023)  Health Literacy: Adequate Health Literacy (09/18/2022)     Readmission Risk Interventions     No data to display

## 2023-03-02 NOTE — Op Note (Signed)
 [facility] Patient Name: Sherry [name] Procedure Date : 03/02/2023 MRN: [id number] Attending MD: Estelita Manas , MD, [id number] Date of Birth: 1945/02/28 CSN: [id number] Age: 78 Admit Type: Inpatient Procedure:                Colonoscopy Indications:              Unexplained iron  deficiency anemia, Hb of 4.6                            without obvious findings on EGD Providers:                Estelita Manas, MD, Collene Edu, RN, Corene Southgate,                            Technician Referring MD:             Internal Medicine Residency Team Medicines:                Monitored Anesthesia Care Complications:            No immediate complications. Estimated Blood Loss:     Estimated blood loss was minimal. Procedure:                Pre-Anesthesia Assessment:                           - Prior to the procedure, a History and Physical                            was performed, and patient medications and                            allergies were reviewed. The patient's tolerance of                            previous anesthesia was also reviewed. The risks                            and benefits of the procedure and the sedation                            options and risks were discussed with the patient.                            All questions were answered, and informed consent                            was obtained. Prior Anticoagulants: The patient has                            taken no anticoagulant or antiplatelet agents                            except for aspirin . ASA Grade Assessment: IV - A  patient with severe systemic disease that is a                            constant threat to life. After reviewing the risks                            and benefits, the patient was deemed in                            satisfactory condition to undergo the procedure.                           After obtaining informed consent, the colonoscope                             was passed under direct vision. Throughout the                            procedure, the patient's blood pressure, pulse, and                            oxygen saturations were monitored continuously. The                            PCF-190TL ([id number]) Olympus colonoscope was                            introduced through the anus and advanced to the the                            cecum, identified by appendiceal orifice and                            ileocecal valve. The colonoscopy was performed                            without difficulty. The patient tolerated the                            procedure well. The quality of the bowel                            preparation was fair. Scope In: 10:44:20 AM Scope Out: 11:01:32 AM Scope Withdrawal Time: 0 hours 12 minutes 36 seconds  Total Procedure Duration: 0 hours 17 minutes 12 seconds  Findings:      Hemorrhoids were found on perianal exam.      Skin tags were found on perianal exam.      Liquid stool was found in the entire colon, making visualization       difficult. Lavage of the area was performed, resulting in clearance with       fair visualization.      Three sessile polyps were found in the transverse colon (1) and       ascending colon )2). The polyps were 4 to 6 mm in  size. These polyps       were removed with a cold snare and a cold biopsy forceps. Resection and       retrieval were complete.      Non-bleeding internal hemorrhoids were found during retroflexion. Impression:               - Preparation of the colon was fair.                           - Hemorrhoids found on perianal exam.                           - Perianal skin tags found on perianal exam.                           - Stool in the entire examined colon.                           - Three 4 to 6 mm polyps in the transverse colon                            and in the ascending colon, removed with a cold                            snare. Resected and  retrieved.                           - Non-bleeding internal hemorrhoids. Moderate Sedation:      Patient did not receive moderate sedation for this procedure, but       instead received monitored anesthesia care. Recommendation:           - Renal diet today.                           - Continue present medications.                           - Await pathology results.                           - Repeat colonoscopy for surveillance based on                            pathology results. Procedure Code(s):        --- Professional ---                           [phone], Colonoscopy, flexible; with removal of                            tumor(s), polyp(s), or other lesion(s) by snare                            technique Diagnosis Code(s):        --- Professional ---  K64.8, Other hemorrhoids                           D12.3, Benign neoplasm of transverse colon (hepatic                            flexure or splenic flexure)                           D12.2, Benign neoplasm of ascending colon                           K64.4, Residual hemorrhoidal skin tags                           D50.9, Iron  deficiency anemia, unspecified CPT copyright 2022 American Medical Association. All rights reserved. The codes documented in this report are preliminary and upon coder review may  be revised to meet current compliance requirements. Estelita Manas, MD 03/02/2023 11:08:55 AM This report has been signed electronically. Number of Addenda: 0

## 2023-03-02 NOTE — Progress Notes (Signed)
 Patient discharged.  Tech removed PIV.  Reviewed discharge instructions, medications and follow up appts with patient and daughter.  Answered questions.  Gave daughter copy of discharge instructions.  There were no prescriptions.  No additional questions or concerns at this time.  Ambulance transport arrived at 1515, loaded up patient and left without incident.

## 2023-03-02 NOTE — Transfer of Care (Signed)
 Immediate Anesthesia Transfer of Care Note  Patient: Sherry [name]  Procedure(s) Performed: COLONOSCOPY WITH PROPOFOL  POLYPECTOMY  Patient Location: PACU  Anesthesia Type:MAC  Level of Consciousness: sedated  Airway & Oxygen Therapy: Patient Spontanous Breathing  Post-op Assessment: Report given to RN and Post -op Vital signs reviewed and stable  Post vital signs: Reviewed and stable  Last Vitals:  Vitals Value Taken Time  BP 136/68 03/02/23 1109  Temp    Pulse 68 03/02/23 1111  Resp 12 03/02/23 1111  SpO2 100 % 03/02/23 1111  Vitals shown include unfiled device data.  Last Pain:  Vitals:   03/02/23 1017  TempSrc: Temporal  PainSc: 0-No pain         Complications: No notable events documented.

## 2023-03-03 ENCOUNTER — Encounter (HOSPITAL_COMMUNITY): Payer: Self-pay | Admitting: Gastroenterology

## 2023-03-03 ENCOUNTER — Telehealth: Payer: Self-pay

## 2023-03-03 NOTE — Progress Notes (Signed)
   Telephone encounter was:  Unsuccessful.  03/03/2023 Name: Sherry [name] MRN: [id number] DOB: 04-18-1945  Unsuccessful outbound call made today to assist with:  Transportation Needs   Outreach Attempt:  3rd Attempt.  Referral closed unable to contact patient. Unable to leave a message     Jon Colt [facility]  [facility], [facility] Guide, Phone: [phone] Website: [email or web address]

## 2023-03-04 ENCOUNTER — Telehealth: Payer: Self-pay

## 2023-03-04 DIAGNOSIS — I440 Atrioventricular block, first degree: Secondary | ICD-10-CM | POA: Diagnosis not present

## 2023-03-04 DIAGNOSIS — M810 Age-related osteoporosis without current pathological fracture: Secondary | ICD-10-CM | POA: Diagnosis not present

## 2023-03-04 DIAGNOSIS — H538 Other visual disturbances: Secondary | ICD-10-CM | POA: Diagnosis not present

## 2023-03-04 DIAGNOSIS — N390 Urinary tract infection, site not specified: Secondary | ICD-10-CM | POA: Diagnosis not present

## 2023-03-04 DIAGNOSIS — J45909 Unspecified asthma, uncomplicated: Secondary | ICD-10-CM | POA: Diagnosis not present

## 2023-03-04 DIAGNOSIS — I700 Atherosclerosis of aorta: Secondary | ICD-10-CM | POA: Diagnosis not present

## 2023-03-04 DIAGNOSIS — I5030 Unspecified diastolic (congestive) heart failure: Secondary | ICD-10-CM | POA: Diagnosis not present

## 2023-03-04 DIAGNOSIS — D62 Acute posthemorrhagic anemia: Secondary | ICD-10-CM | POA: Diagnosis not present

## 2023-03-04 DIAGNOSIS — I051 Rheumatic mitral insufficiency: Secondary | ICD-10-CM | POA: Diagnosis not present

## 2023-03-04 DIAGNOSIS — I130 Hypertensive heart and chronic kidney disease with heart failure and stage 1 through stage 4 chronic kidney disease, or unspecified chronic kidney disease: Secondary | ICD-10-CM | POA: Diagnosis not present

## 2023-03-04 DIAGNOSIS — D631 Anemia in chronic kidney disease: Secondary | ICD-10-CM | POA: Diagnosis not present

## 2023-03-04 DIAGNOSIS — E1122 Type 2 diabetes mellitus with diabetic chronic kidney disease: Secondary | ICD-10-CM | POA: Diagnosis not present

## 2023-03-04 DIAGNOSIS — N184 Chronic kidney disease, stage 4 (severe): Secondary | ICD-10-CM | POA: Diagnosis not present

## 2023-03-04 DIAGNOSIS — M47816 Spondylosis without myelopathy or radiculopathy, lumbar region: Secondary | ICD-10-CM | POA: Diagnosis not present

## 2023-03-04 DIAGNOSIS — N2581 Secondary hyperparathyroidism of renal origin: Secondary | ICD-10-CM | POA: Diagnosis not present

## 2023-03-04 DIAGNOSIS — I2510 Atherosclerotic heart disease of native coronary artery without angina pectoris: Secondary | ICD-10-CM | POA: Diagnosis not present

## 2023-03-04 DIAGNOSIS — K644 Residual hemorrhoidal skin tags: Secondary | ICD-10-CM | POA: Diagnosis not present

## 2023-03-04 DIAGNOSIS — K219 Gastro-esophageal reflux disease without esophagitis: Secondary | ICD-10-CM | POA: Diagnosis not present

## 2023-03-04 DIAGNOSIS — N179 Acute kidney failure, unspecified: Secondary | ICD-10-CM | POA: Diagnosis not present

## 2023-03-04 DIAGNOSIS — M170 Bilateral primary osteoarthritis of knee: Secondary | ICD-10-CM | POA: Diagnosis not present

## 2023-03-04 DIAGNOSIS — D61818 Other pancytopenia: Secondary | ICD-10-CM | POA: Diagnosis not present

## 2023-03-04 DIAGNOSIS — K5900 Constipation, unspecified: Secondary | ICD-10-CM | POA: Diagnosis not present

## 2023-03-04 DIAGNOSIS — E785 Hyperlipidemia, unspecified: Secondary | ICD-10-CM | POA: Diagnosis not present

## 2023-03-04 LAB — SURGICAL PATHOLOGY

## 2023-03-04 NOTE — Transitions of Care (Post Inpatient/ED Visit) (Signed)
   03/04/2023  Name: Sherry [name] MRN: [id number] DOB: 02-04-1946  Today's TOC FU Call Status: Today's TOC FU Call Status:: Unsuccessful Call (1st Attempt) Unsuccessful Call (1st Attempt) Date: 03/04/23  Attempted to reach the patient regarding the most recent Inpatient/ED visit.  Follow Up Plan: Additional outreach attempts will be made to reach the patient to complete the Transitions of Care (Post Inpatient/ED visit) call.   Barnie Gowda RN, BSN, CCM RN Care Manager  Transitions of Care  [facility] - [facility]  [phone]

## 2023-03-04 NOTE — Anesthesia Postprocedure Evaluation (Signed)
 Anesthesia Post Note  Patient: Sherry [name]  Procedure(s) Performed: ESOPHAGOGASTRODUODENOSCOPY (EGD) WITH PROPOFOL  BIOPSY     Patient location during evaluation: PACU Anesthesia Type: MAC Level of consciousness: awake and alert Pain management: pain level controlled Vital Signs Assessment: post-procedure vital signs reviewed and stable Respiratory status: spontaneous breathing, nonlabored ventilation, respiratory function stable and patient connected to nasal cannula oxygen Cardiovascular status: stable and blood pressure returned to baseline Postop Assessment: no apparent nausea or vomiting Anesthetic complications: no   No notable events documented.  Last Vitals:  Vitals:   03/02/23 1145 03/02/23 1202  BP: (!) 160/70 128/78  Pulse: 69 72  Resp: 11 16  Temp: (!) 36.2 C (!) 36.3 C  SpO2: 98% 99%    Last Pain:  Vitals:   03/02/23 1202  TempSrc: Oral  PainSc:                  Chiyoko Torrico L Mitsugi Schrader

## 2023-03-05 ENCOUNTER — Telehealth: Payer: Self-pay

## 2023-03-05 DIAGNOSIS — I5032 Chronic diastolic (congestive) heart failure: Secondary | ICD-10-CM

## 2023-03-05 LAB — SURGICAL PATHOLOGY

## 2023-03-05 NOTE — Telephone Encounter (Signed)
 RTC to [facility] for VO for patient.  Message was left that the Clinics had returned his call.

## 2023-03-05 NOTE — Telephone Encounter (Signed)
 Home pt Sherry [name] bias  tele [phone] from [facility] is requesting a call back  for verbal orders

## 2023-03-05 NOTE — Transitions of Care (Post Inpatient/ED Visit) (Signed)
 03/05/2023  Name: Sherry [name] MRN: [id number] DOB: 1945/04/02  Today's TOC FU Call Status: Today's TOC FU Call Status:: Successful TOC FU Call Completed TOC FU Call Complete Date: 03/05/23 Patient's Name and Date of Birth confirmed.  Transition Care Management Follow-up Telephone Call Date of Discharge: 03/02/23 Discharge [facility]: [facility] Type of Discharge: Inpatient Admission Primary Inpatient Discharge Diagnosis:: Symptomatic Anemia How have you been since you were released from the hospital?: Better (Per patient's daughter Sherry [name]) Any questions or concerns?: Yes Patient Questions/Concerns:: Daughter Sherry [name] would like to become her mom's paid caregiver with [employer].  Advised that would need to go through [facility] office and this writer could not assist her with this issue.  Sherry [name] acknowleged and will contact Dept. Of SS directly. Patient Questions/Concerns Addressed: Other:  Items Reviewed: Did you receive and understand the discharge instructions provided?: Yes Medications obtained,verified, and reconciled?: Yes (Medications Reviewed) Any new allergies since your discharge?: No Dietary orders reviewed?: No Do you have support at home?: Yes People in Home: child(ren), adult Name of Support/Comfort Primary Source: Daughter, Sherry [name]  Medications Reviewed Today: Medications Reviewed Today     Reviewed by Irineo Manns, RN (Case Manager) on 03/05/23 at [phone]  Med List Status: <None>   Medication Order Taking? Sig Documenting Provider Last Dose Status Informant  acetaminophen  (TYLENOL ) 325 MG tablet [id number] Yes Take 2 tablets (650 mg total) by mouth every 6 (six) hours as needed for mild pain (or Fever >/= 101). Cleven Dallas, DO Taking Active Self, Spouse/Significant Other, Pharmacy Records  albuterol  (PROVENTIL ) (2.5 MG/3ML) 0.083% nebulizer solution [id number] Yes Take 3 mLs (2.5 mg total) by nebulization every 6 (six) hours as needed for wheezing. Cathey Clunes, MD Taking Active Self, Spouse/Significant Other, Pharmacy Records  aspirin  81 MG chewable tablet [id number] Yes Chew 1 tablet (81 mg total) by mouth daily. Cathey Clunes, MD Taking Active Self, Spouse/Significant Other, Pharmacy Records  calcium  carbonate (OSCAL) 1500 (600 Ca) MG TABS tablet [id number] Yes Take 1 tablet (1,500 mg total) by mouth 3 (three) times daily with meals. Jackolyn Masker, MD Taking Active Self, Spouse/Significant Other, Pharmacy Records  cholecalciferol  (VITAMIN D3) 25 MCG (1000 UNIT) tablet [id number] Yes Take 1 tablet (1,000 Units total) by mouth daily. Cleven Dallas, DO Taking Active Self, Spouse/Significant Other, Pharmacy Records  febuxostat  (ULORIC ) 40 MG tablet [id number] Yes Take 20 mg by mouth daily. [provider] Taking Active Pharmacy Records, Self, Spouse/Significant Other  hydrALAZINE  (APRESOLINE ) 100 MG tablet [id number] Yes TAKE 1 TABLET(100 MG) BY MOUTH THREE TIMES DAILY Jackolyn Masker, MD Taking Active Self, Spouse/Significant Other, Pharmacy Records  JARDIANCE  10 MG TABS tablet [id number] Yes TAKE 1 TABLET BY MOUTH DAILY  BEFORE Brien Can, MD Taking Active Self, Spouse/Significant Other, Pharmacy Records  lidocaine  (LMX) 4 % cream [id number] Yes Apply 1 Application topically 2 (two) times daily as needed (Pain on sacral wound area). Cathey Clunes, MD Taking Active Self, Spouse/Significant Other, Pharmacy Records  multivitamin (RENA-VIT) TABS tablet [id number] Yes Take 1 tablet by mouth at bedtime. Cathey Clunes, MD Taking Active Self, Spouse/Significant Other, Pharmacy Records  pantoprazole  (PROTONIX ) 40 MG tablet [id number] Yes TAKE 1 TABLET BY MOUTH DAILY Jackolyn Masker, MD Taking Active Self, Spouse/Significant Other, Pharmacy Records  PHENobarbital  (LUMINAL) 64.8 MG tablet [id number] Yes Take 2 tablets (129.6 mg total) by mouth at bedtime. Jhonny Moss, MD Taking Active Self, Spouse/Significant Other, Pharmacy  Records  potassium chloride  SA (KLOR-CON  M) 20 MEQ tablet [id number] Yes Take 1 tablet (20 mEq total)  by mouth daily. [place], Arlice Bene, FNP Taking Active Self, Spouse/Significant Other, Pharmacy Records  rosuvastatin  (CRESTOR ) 40 MG tablet [id number] Yes Take 1 tablet (40 mg total) by mouth daily. [place], Arlice Bene, FNP Taking Active Pharmacy Records, Self, Spouse/Significant Other           Med Note Cornelia Dieter, SEBASTIAN   Tue Feb 25, 2023  9:20 PM) Patient reports compliance, no recent fills noted on dispense history  senna-docusate (SENOKOT S) 8.6-50 MG tablet [id number] Yes Take 2 tablets by mouth daily. Jackolyn Masker, MD Taking Active Self, Spouse/Significant Other, Pharmacy Records  sevelamer  carbonate (RENVELA ) 800 MG tablet [id number] Yes Take 1 tablet (800 mg total) by mouth 3 (three) times daily with meals. Cleven Dallas, DO Taking Active Self, Spouse/Significant Other, Pharmacy Records  torsemide  (DEMADEX ) 20 MG tablet [id number] Yes Take 3 tablets (60 mg total) by mouth daily. TAKE 2 TABLETS BY MOUTH DAILY AND AN EXTRA DOSE(2 TABLETS) IN THE PM FOR SWELLING OR SHORTNESS OF [place], Arlice Bene, FNP Taking Active Self, Spouse/Significant Other, Pharmacy Records  zonisamide  (ZONEGRAN ) 100 MG capsule [id number] Yes TAKE 2 CAPSULES BY MOUTH AT Namon Avena, MD Taking Active Self, Spouse/Significant Other, Pharmacy Records            Home Care and Equipment/Supplies: Were Home Health Services Ordered?: Yes Name of Home Health Agency:: [facility] Has Agency set up a time to come to your home?: Yes First Home Health Visit Date: 03/04/23 Any new equipment or medical supplies ordered?: Yes Name of Medical supply agency?: [employer] Were you able to get the equipment/medical supplies?: Yes Do you have any questions related to the use of the equipment/supplies?: No  Functional Questionnaire: Do you need assistance with bathing/showering or dressing?: Yes Do you need assistance with meal  preparation?: Yes Do you need assistance with eating?: No Do you have difficulty maintaining continence: No Do you need assistance with getting out of bed/getting out of a chair/moving?: Yes Do you have difficulty managing or taking your medications?: Yes  Follow up appointments reviewed: PCP Follow-up appointment confirmed?: No (Patientrs daughter to call and make appointment) MD Provider Line Number:[phone] Given: No Specialist Hospital Follow-up appointment confirmed?: Yes Date of Specialist follow-up appointment?: 03/07/23 Follow-Up Specialty Provider:: [facility] Do you need transportation to your follow-up appointment?: No Do you understand care options if your condition(s) worsen?: Yes-patient verbalized understanding  SDOH Interventions Today    Flowsheet Row Most Recent Value  SDOH Interventions   Food Insecurity Interventions Intervention Not Indicated  Housing Interventions Intervention Not Indicated  Transportation Interventions Intervention Not Indicated  Utilities Interventions Intervention Not Indicated      Irineo Manns RN, BSN, CCM RN Care Manager  Transitions of Care  [facility] - [facility]  [phone]

## 2023-03-06 NOTE — Telephone Encounter (Signed)
Call from [facility] PT with [facility]. Stated PT eval was completed 03/04/23; requesting verbal order to "Continue PT twice a week x 4 weeks then once a week x 4 weeks" - VO given; if not appropriate let me know. Also Loraine Leriche is requesting an order (DME) for a 3 in1 commode. Fax order to #[phone] Attn:[place]. Thanks

## 2023-03-07 ENCOUNTER — Encounter (HOSPITAL_COMMUNITY): Payer: 59

## 2023-03-07 ENCOUNTER — Telehealth: Payer: Self-pay

## 2023-03-07 DIAGNOSIS — K644 Residual hemorrhoidal skin tags: Secondary | ICD-10-CM | POA: Diagnosis not present

## 2023-03-07 DIAGNOSIS — H538 Other visual disturbances: Secondary | ICD-10-CM | POA: Diagnosis not present

## 2023-03-07 DIAGNOSIS — E785 Hyperlipidemia, unspecified: Secondary | ICD-10-CM | POA: Diagnosis not present

## 2023-03-07 DIAGNOSIS — E1122 Type 2 diabetes mellitus with diabetic chronic kidney disease: Secondary | ICD-10-CM | POA: Diagnosis not present

## 2023-03-07 DIAGNOSIS — N184 Chronic kidney disease, stage 4 (severe): Secondary | ICD-10-CM | POA: Diagnosis not present

## 2023-03-07 DIAGNOSIS — D61818 Other pancytopenia: Secondary | ICD-10-CM | POA: Diagnosis not present

## 2023-03-07 DIAGNOSIS — I051 Rheumatic mitral insufficiency: Secondary | ICD-10-CM | POA: Diagnosis not present

## 2023-03-07 DIAGNOSIS — D62 Acute posthemorrhagic anemia: Secondary | ICD-10-CM | POA: Diagnosis not present

## 2023-03-07 DIAGNOSIS — N390 Urinary tract infection, site not specified: Secondary | ICD-10-CM | POA: Diagnosis not present

## 2023-03-07 DIAGNOSIS — N179 Acute kidney failure, unspecified: Secondary | ICD-10-CM | POA: Diagnosis not present

## 2023-03-07 DIAGNOSIS — I5030 Unspecified diastolic (congestive) heart failure: Secondary | ICD-10-CM | POA: Diagnosis not present

## 2023-03-07 DIAGNOSIS — N2581 Secondary hyperparathyroidism of renal origin: Secondary | ICD-10-CM | POA: Diagnosis not present

## 2023-03-07 DIAGNOSIS — K5900 Constipation, unspecified: Secondary | ICD-10-CM | POA: Diagnosis not present

## 2023-03-07 DIAGNOSIS — M170 Bilateral primary osteoarthritis of knee: Secondary | ICD-10-CM | POA: Diagnosis not present

## 2023-03-07 DIAGNOSIS — I700 Atherosclerosis of aorta: Secondary | ICD-10-CM | POA: Diagnosis not present

## 2023-03-07 DIAGNOSIS — I2510 Atherosclerotic heart disease of native coronary artery without angina pectoris: Secondary | ICD-10-CM | POA: Diagnosis not present

## 2023-03-07 DIAGNOSIS — K219 Gastro-esophageal reflux disease without esophagitis: Secondary | ICD-10-CM | POA: Diagnosis not present

## 2023-03-07 DIAGNOSIS — M47816 Spondylosis without myelopathy or radiculopathy, lumbar region: Secondary | ICD-10-CM | POA: Diagnosis not present

## 2023-03-07 DIAGNOSIS — I130 Hypertensive heart and chronic kidney disease with heart failure and stage 1 through stage 4 chronic kidney disease, or unspecified chronic kidney disease: Secondary | ICD-10-CM | POA: Diagnosis not present

## 2023-03-07 DIAGNOSIS — J45909 Unspecified asthma, uncomplicated: Secondary | ICD-10-CM | POA: Diagnosis not present

## 2023-03-07 DIAGNOSIS — I440 Atrioventricular block, first degree: Secondary | ICD-10-CM | POA: Diagnosis not present

## 2023-03-07 DIAGNOSIS — M810 Age-related osteoporosis without current pathological fracture: Secondary | ICD-10-CM | POA: Diagnosis not present

## 2023-03-07 DIAGNOSIS — D631 Anemia in chronic kidney disease: Secondary | ICD-10-CM | POA: Diagnosis not present

## 2023-03-07 NOTE — Progress Notes (Signed)
   Telephone encounter was:  Unsuccessful.  03/07/2023 Name: LATOSIA GENNARELLI MRN: [id number] DOB: 06-04-1945  Unsuccessful outbound call made today to assist with:  Transportation Needs   Outreach Attempt:  3rd Attempt.  Referral closed unable to contact patient.  Unable to leave a message    Lenard Forth [facility]  [facility] Guide, Phone: [phone] Fax: [phone] Website: [email or web address]

## 2023-03-07 NOTE — Telephone Encounter (Signed)
Message sent to Chilon re: DME order.

## 2023-03-10 ENCOUNTER — Other Ambulatory Visit (HOSPITAL_COMMUNITY): Payer: Self-pay | Admitting: Family Medicine

## 2023-03-11 ENCOUNTER — Ambulatory Visit: Payer: 59 | Admitting: Student

## 2023-03-11 ENCOUNTER — Encounter: Payer: Self-pay | Admitting: Student

## 2023-03-11 DIAGNOSIS — E1122 Type 2 diabetes mellitus with diabetic chronic kidney disease: Secondary | ICD-10-CM | POA: Diagnosis not present

## 2023-03-11 DIAGNOSIS — N2581 Secondary hyperparathyroidism of renal origin: Secondary | ICD-10-CM | POA: Diagnosis not present

## 2023-03-11 DIAGNOSIS — I5030 Unspecified diastolic (congestive) heart failure: Secondary | ICD-10-CM | POA: Diagnosis not present

## 2023-03-11 DIAGNOSIS — M170 Bilateral primary osteoarthritis of knee: Secondary | ICD-10-CM | POA: Diagnosis not present

## 2023-03-11 DIAGNOSIS — K644 Residual hemorrhoidal skin tags: Secondary | ICD-10-CM | POA: Diagnosis not present

## 2023-03-11 DIAGNOSIS — I130 Hypertensive heart and chronic kidney disease with heart failure and stage 1 through stage 4 chronic kidney disease, or unspecified chronic kidney disease: Secondary | ICD-10-CM | POA: Diagnosis not present

## 2023-03-11 DIAGNOSIS — N390 Urinary tract infection, site not specified: Secondary | ICD-10-CM | POA: Diagnosis not present

## 2023-03-11 DIAGNOSIS — R269 Unspecified abnormalities of gait and mobility: Secondary | ICD-10-CM | POA: Diagnosis not present

## 2023-03-11 DIAGNOSIS — K219 Gastro-esophageal reflux disease without esophagitis: Secondary | ICD-10-CM | POA: Diagnosis not present

## 2023-03-11 DIAGNOSIS — H538 Other visual disturbances: Secondary | ICD-10-CM | POA: Diagnosis not present

## 2023-03-11 DIAGNOSIS — J45909 Unspecified asthma, uncomplicated: Secondary | ICD-10-CM | POA: Diagnosis not present

## 2023-03-11 DIAGNOSIS — N179 Acute kidney failure, unspecified: Secondary | ICD-10-CM | POA: Diagnosis not present

## 2023-03-11 DIAGNOSIS — M47816 Spondylosis without myelopathy or radiculopathy, lumbar region: Secondary | ICD-10-CM | POA: Diagnosis not present

## 2023-03-11 DIAGNOSIS — E785 Hyperlipidemia, unspecified: Secondary | ICD-10-CM | POA: Diagnosis not present

## 2023-03-11 DIAGNOSIS — I440 Atrioventricular block, first degree: Secondary | ICD-10-CM | POA: Diagnosis not present

## 2023-03-11 DIAGNOSIS — D61818 Other pancytopenia: Secondary | ICD-10-CM | POA: Diagnosis not present

## 2023-03-11 DIAGNOSIS — D631 Anemia in chronic kidney disease: Secondary | ICD-10-CM | POA: Diagnosis not present

## 2023-03-11 DIAGNOSIS — I700 Atherosclerosis of aorta: Secondary | ICD-10-CM | POA: Diagnosis not present

## 2023-03-11 DIAGNOSIS — I2510 Atherosclerotic heart disease of native coronary artery without angina pectoris: Secondary | ICD-10-CM | POA: Diagnosis not present

## 2023-03-11 DIAGNOSIS — N184 Chronic kidney disease, stage 4 (severe): Secondary | ICD-10-CM | POA: Diagnosis not present

## 2023-03-11 DIAGNOSIS — D62 Acute posthemorrhagic anemia: Secondary | ICD-10-CM | POA: Diagnosis not present

## 2023-03-11 DIAGNOSIS — K5900 Constipation, unspecified: Secondary | ICD-10-CM | POA: Diagnosis not present

## 2023-03-11 DIAGNOSIS — M810 Age-related osteoporosis without current pathological fracture: Secondary | ICD-10-CM | POA: Diagnosis not present

## 2023-03-11 DIAGNOSIS — I051 Rheumatic mitral insufficiency: Secondary | ICD-10-CM | POA: Diagnosis not present

## 2023-03-11 NOTE — Progress Notes (Signed)
CC: Bedside commode referral  This is a telephone encounter between [name] and Modena Slater on 03/11/2023 for bedside commode referral. The visit was conducted with the patient located at home and Modena Slater at [facility]. The patient's identity was confirmed using their DOB and current address. The patient has consented to being evaluated through a telephone encounter and understands the associated risks (an examination cannot be done and the patient may need to come in for an appointment) / benefits (allows the patient to remain at home, decreasing exposure to coronavirus). I personally spent 15 minutes on medical discussion.   HPI:  [name] is a 78 y.o. with PMH as below.   Please see A&P for assessment of the patient's acute and chronic medical conditions.   Past Medical History:  Diagnosis Date   AKI (acute kidney injury) (HCC) 01/31/2020   Anemia    Aortic stenosis    Arthritis    Bilateral lower extremity edema    Bilateral lower extremity edema    Blurry vision, bilateral 05/08/2016   Bradycardia    a. 01/2013 - asymptomatic.   Chronic diastolic heart failure (HCC)    Chronic sinus bradycardia 07/02/2012   CKD (chronic kidney disease)    a. baseline CKD stage III   Coronary artery disease    Electrolyte abnormality 08/30/2022   Epilepsy (HCC) 06/04/2006   EEG 02/19/12 " Interpretation:  This is an abnormal EEG demonstrating continuous and generalized slowing of the background in the theta frequency. Irregular rhythm of EKG noted.  Clinical correlation: the generalized slowing is consistent with a mild encephalopathy of nonspecific etiologies for which the differential would include infectious, toxic, metabolic, inflammatory, and vascular etiologies.    GERD (gastroesophageal reflux disease)    Headache(784.0)    Heart block AV second degree 02/15/2013   Heart murmur    Hyperlipidemia    Hypertension    Hypocalcemia 08/27/2022   Patient with chronic  hypocalcemia, found to have a critical calcium of 5.8 on CMP today.  Albumin 2.7, corrected calcium 6.8.  Likely multifactorial due to CKD, vitamin D deficiency.  This was discussed extensively with the patient and family.  Risks and benefits of inpatient management discussed and family decided to continue with current plan of outpatient management with close follow-up.  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 -Orbits: Grossly unremarkable. -Paranasal sinuses: Imaged portions clear. -Brain: No acute abnormalities such as hemorrhage, hydrocephalus, acute ischemia, or evidence of a mass lesion.  There is patchy bilateral cerebral white matter T2 and flair signal hyper intensities.  Centimeter or smaller CSF intensity   Mobitz (type) I (Wenckebach's) atrioventricular block    a. 01/2013 - asymptomatic.   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   TIA (transient ischemic attack) 2014   pt stated she had "mini strokes"   UTI (urinary tract infection) 03/07/2022   Vitamin D deficiency 04/22/2014   Review of Systems:  Negative except for what is stated in HPI  Discusses for commode. The one she has is is in bad shape. It shakes when she gets on it and it is not holding up the way it supposed to. Has had it for about 5 years. Wants a new one. No falls. Handle is not broken. It is weak, and worried she is going to fall off it. Not able to make it to the bathroom.    Assessment & Plan:    Abnormality of gait- basically wheelchair bound Patient has had declining function for some time now.  She has overall become weak.  She is mostly wheelchair-bound now.  She has chronic lower extremity weakness.  She was recently hospitalized which led to further decline in her functional status.  She request new bedside commode today.  She states the one she has had has been there for 5 years and it is weak.  She states that it is not holding up.  She is scared that she may fall off the bedside commode at this it shakes when she is on it.  Plan: -Patient already on physical therapy home health which is helping -New referral for bedside commode placed   Patient discussed with Dr. Marquita Palms, DO  Internal Medicine Resident

## 2023-03-11 NOTE — Assessment & Plan Note (Signed)
Patient has had declining function for some time now.  She has overall become weak.  She is mostly wheelchair-bound now.  She has chronic lower extremity weakness.  She was recently hospitalized which led to further decline in her functional status.  She request new bedside commode today.  She states the one she has had has been there for 5 years and it is weak.  She states that it is not holding up.  She is scared that she may fall off the bedside commode at this it shakes when she is on it.  Plan: -Patient already on physical therapy home health which is helping -New referral for bedside commode placed

## 2023-03-12 DIAGNOSIS — H538 Other visual disturbances: Secondary | ICD-10-CM | POA: Diagnosis not present

## 2023-03-12 DIAGNOSIS — I051 Rheumatic mitral insufficiency: Secondary | ICD-10-CM | POA: Diagnosis not present

## 2023-03-12 DIAGNOSIS — K644 Residual hemorrhoidal skin tags: Secondary | ICD-10-CM | POA: Diagnosis not present

## 2023-03-12 DIAGNOSIS — I5030 Unspecified diastolic (congestive) heart failure: Secondary | ICD-10-CM | POA: Diagnosis not present

## 2023-03-12 DIAGNOSIS — M170 Bilateral primary osteoarthritis of knee: Secondary | ICD-10-CM | POA: Diagnosis not present

## 2023-03-12 DIAGNOSIS — M47816 Spondylosis without myelopathy or radiculopathy, lumbar region: Secondary | ICD-10-CM | POA: Diagnosis not present

## 2023-03-12 DIAGNOSIS — I440 Atrioventricular block, first degree: Secondary | ICD-10-CM | POA: Diagnosis not present

## 2023-03-12 DIAGNOSIS — M810 Age-related osteoporosis without current pathological fracture: Secondary | ICD-10-CM | POA: Diagnosis not present

## 2023-03-12 DIAGNOSIS — D631 Anemia in chronic kidney disease: Secondary | ICD-10-CM | POA: Diagnosis not present

## 2023-03-12 DIAGNOSIS — K5900 Constipation, unspecified: Secondary | ICD-10-CM | POA: Diagnosis not present

## 2023-03-12 DIAGNOSIS — J45909 Unspecified asthma, uncomplicated: Secondary | ICD-10-CM | POA: Diagnosis not present

## 2023-03-12 DIAGNOSIS — I700 Atherosclerosis of aorta: Secondary | ICD-10-CM | POA: Diagnosis not present

## 2023-03-12 DIAGNOSIS — N390 Urinary tract infection, site not specified: Secondary | ICD-10-CM | POA: Diagnosis not present

## 2023-03-12 DIAGNOSIS — D62 Acute posthemorrhagic anemia: Secondary | ICD-10-CM | POA: Diagnosis not present

## 2023-03-12 DIAGNOSIS — N179 Acute kidney failure, unspecified: Secondary | ICD-10-CM | POA: Diagnosis not present

## 2023-03-12 DIAGNOSIS — I130 Hypertensive heart and chronic kidney disease with heart failure and stage 1 through stage 4 chronic kidney disease, or unspecified chronic kidney disease: Secondary | ICD-10-CM | POA: Diagnosis not present

## 2023-03-12 DIAGNOSIS — K219 Gastro-esophageal reflux disease without esophagitis: Secondary | ICD-10-CM | POA: Diagnosis not present

## 2023-03-12 DIAGNOSIS — I2510 Atherosclerotic heart disease of native coronary artery without angina pectoris: Secondary | ICD-10-CM | POA: Diagnosis not present

## 2023-03-12 DIAGNOSIS — N2581 Secondary hyperparathyroidism of renal origin: Secondary | ICD-10-CM | POA: Diagnosis not present

## 2023-03-12 DIAGNOSIS — N184 Chronic kidney disease, stage 4 (severe): Secondary | ICD-10-CM | POA: Diagnosis not present

## 2023-03-12 DIAGNOSIS — E1122 Type 2 diabetes mellitus with diabetic chronic kidney disease: Secondary | ICD-10-CM | POA: Diagnosis not present

## 2023-03-12 DIAGNOSIS — D61818 Other pancytopenia: Secondary | ICD-10-CM | POA: Diagnosis not present

## 2023-03-12 DIAGNOSIS — E785 Hyperlipidemia, unspecified: Secondary | ICD-10-CM | POA: Diagnosis not present

## 2023-03-13 DIAGNOSIS — N179 Acute kidney failure, unspecified: Secondary | ICD-10-CM | POA: Diagnosis not present

## 2023-03-13 DIAGNOSIS — K5900 Constipation, unspecified: Secondary | ICD-10-CM | POA: Diagnosis not present

## 2023-03-13 DIAGNOSIS — M170 Bilateral primary osteoarthritis of knee: Secondary | ICD-10-CM | POA: Diagnosis not present

## 2023-03-13 DIAGNOSIS — M47816 Spondylosis without myelopathy or radiculopathy, lumbar region: Secondary | ICD-10-CM | POA: Diagnosis not present

## 2023-03-13 DIAGNOSIS — N2581 Secondary hyperparathyroidism of renal origin: Secondary | ICD-10-CM | POA: Diagnosis not present

## 2023-03-13 DIAGNOSIS — I700 Atherosclerosis of aorta: Secondary | ICD-10-CM | POA: Diagnosis not present

## 2023-03-13 DIAGNOSIS — I2510 Atherosclerotic heart disease of native coronary artery without angina pectoris: Secondary | ICD-10-CM | POA: Diagnosis not present

## 2023-03-13 DIAGNOSIS — I440 Atrioventricular block, first degree: Secondary | ICD-10-CM | POA: Diagnosis not present

## 2023-03-13 DIAGNOSIS — N184 Chronic kidney disease, stage 4 (severe): Secondary | ICD-10-CM | POA: Diagnosis not present

## 2023-03-13 DIAGNOSIS — D631 Anemia in chronic kidney disease: Secondary | ICD-10-CM | POA: Diagnosis not present

## 2023-03-13 DIAGNOSIS — D62 Acute posthemorrhagic anemia: Secondary | ICD-10-CM | POA: Diagnosis not present

## 2023-03-13 DIAGNOSIS — E1122 Type 2 diabetes mellitus with diabetic chronic kidney disease: Secondary | ICD-10-CM | POA: Diagnosis not present

## 2023-03-13 DIAGNOSIS — I5031 Acute diastolic (congestive) heart failure: Secondary | ICD-10-CM | POA: Diagnosis not present

## 2023-03-13 DIAGNOSIS — M7989 Other specified soft tissue disorders: Secondary | ICD-10-CM | POA: Diagnosis not present

## 2023-03-13 DIAGNOSIS — I5030 Unspecified diastolic (congestive) heart failure: Secondary | ICD-10-CM | POA: Diagnosis not present

## 2023-03-13 DIAGNOSIS — H538 Other visual disturbances: Secondary | ICD-10-CM | POA: Diagnosis not present

## 2023-03-13 DIAGNOSIS — I130 Hypertensive heart and chronic kidney disease with heart failure and stage 1 through stage 4 chronic kidney disease, or unspecified chronic kidney disease: Secondary | ICD-10-CM | POA: Diagnosis not present

## 2023-03-13 DIAGNOSIS — E785 Hyperlipidemia, unspecified: Secondary | ICD-10-CM | POA: Diagnosis not present

## 2023-03-13 DIAGNOSIS — J45909 Unspecified asthma, uncomplicated: Secondary | ICD-10-CM | POA: Diagnosis not present

## 2023-03-13 DIAGNOSIS — M810 Age-related osteoporosis without current pathological fracture: Secondary | ICD-10-CM | POA: Diagnosis not present

## 2023-03-13 DIAGNOSIS — I051 Rheumatic mitral insufficiency: Secondary | ICD-10-CM | POA: Diagnosis not present

## 2023-03-13 DIAGNOSIS — K644 Residual hemorrhoidal skin tags: Secondary | ICD-10-CM | POA: Diagnosis not present

## 2023-03-13 DIAGNOSIS — D61818 Other pancytopenia: Secondary | ICD-10-CM | POA: Diagnosis not present

## 2023-03-13 DIAGNOSIS — K219 Gastro-esophageal reflux disease without esophagitis: Secondary | ICD-10-CM | POA: Diagnosis not present

## 2023-03-13 DIAGNOSIS — N390 Urinary tract infection, site not specified: Secondary | ICD-10-CM | POA: Diagnosis not present

## 2023-03-13 NOTE — Progress Notes (Signed)
 Internal Medicine Clinic Attending  Case discussed with the resident at the time of the visit.  We reviewed the resident's history and exam and pertinent patient test results.  I agree with the assessment, diagnosis, and plan of care documented in the resident's note.

## 2023-03-14 ENCOUNTER — Ambulatory Visit: Payer: 59 | Attending: Cardiovascular Disease | Admitting: Cardiovascular Disease

## 2023-03-14 ENCOUNTER — Encounter: Payer: Self-pay | Admitting: Cardiovascular Disease

## 2023-03-14 VITALS — BP 142/94 | HR 67 | Ht 61.0 in | Wt 144.0 lb

## 2023-03-14 DIAGNOSIS — I350 Nonrheumatic aortic (valve) stenosis: Secondary | ICD-10-CM | POA: Diagnosis not present

## 2023-03-14 DIAGNOSIS — I5032 Chronic diastolic (congestive) heart failure: Secondary | ICD-10-CM

## 2023-03-14 DIAGNOSIS — N185 Chronic kidney disease, stage 5: Secondary | ICD-10-CM

## 2023-03-14 DIAGNOSIS — I2510 Atherosclerotic heart disease of native coronary artery without angina pectoris: Secondary | ICD-10-CM | POA: Diagnosis not present

## 2023-03-14 NOTE — Assessment & Plan Note (Signed)
Continue torsemide at current dose.  No clear signs of volume overload on her exam at present.  She has severe LVH and severe aortic stenosis with a palliative approach to her care.

## 2023-03-14 NOTE — Assessment & Plan Note (Signed)
Patient with severe aortic stenosis.  Not a candidate for intervention due to nonambulatory status, stage V chronic kidney disease, high risk of complete heart block, and multiple medical problems.

## 2023-03-14 NOTE — Progress Notes (Signed)
Cardiology Office Note:    Date:  03/14/2023   ID:  Sherry, [name] 07-Feb-1946, MRN [id number]  PCP:  Gwenevere Abbot, MD   [facility] Providers Cardiologist:  Tonny Bollman, MD     Referring MD: Gwenevere Abbot, MD   Chief Complaint  Patient presents with   Follow-up    History of Present Illness:    Sherry [name] is a 78 y.o. female with a hx of aortic stenosis, chronic diastolic heart failure, and coronary artery disease. The patient has been managed conservatively in the context of her multiple medical problems and poor baseline functional capacity. She has stage 5 chronic kidney disease. The patient also has underlying conduction disease and as we have discussed TAVR with her in the past, she would be at very high risk of heart block and need for permanent pacemaker. She has been debilitated by severe arthritis and inability to stand for more than very short periods of time. She has been essentially wheelchair-bound.   The patient is here with her husband today.  She was recently hospitalized with severe anemia hemoglobin less than 5.  There is no clear source of GI bleeding but her hemoglobin came up to greater than 9 with blood transfusion and supportive care.  She underwent endoscopy and colonoscopy while she was hospitalized.  She reports that she is doing okay now.  She denies chest pain, chest pressure, or shortness of breath.  States that she did have some of the symptoms when she was severely anemic.  She denies orthopnea or PND.  She is having some issues with leg swelling but this has been chronic for her.  She is taking torsemide 60 mg daily.  No lightheadedness or syncope.  She remains wheelchair-bound.   Current Medications: Current Meds  Medication Sig   acetaminophen (TYLENOL) 325 MG tablet Take 2 tablets (650 mg total) by mouth every 6 (six) hours as needed for mild pain (or Fever >/= 101).   albuterol (PROVENTIL) (2.5 MG/3ML) 0.083% nebulizer  solution Take 3 mLs (2.5 mg total) by nebulization every 6 (six) hours as needed for wheezing.   aspirin 81 MG chewable tablet Chew 1 tablet (81 mg total) by mouth daily.   calcium carbonate (OSCAL) 1500 (600 Ca) MG TABS tablet Take 1 tablet (1,500 mg total) by mouth 3 (three) times daily with meals.   cholecalciferol (VITAMIN D3) 25 MCG (1000 UNIT) tablet Take 1 tablet (1,000 Units total) by mouth daily.   febuxostat (ULORIC) 40 MG tablet Take 20 mg by mouth daily.   hydrALAZINE (APRESOLINE) 100 MG tablet TAKE 1 TABLET(100 MG) BY MOUTH THREE TIMES DAILY   JARDIANCE 10 MG TABS tablet TAKE 1 TABLET BY MOUTH DAILY  BEFORE BREAKFAST   lidocaine (LMX) 4 % cream Apply 1 Application topically 2 (two) times daily as needed (Pain on sacral wound area).   multivitamin (RENA-VIT) TABS tablet Take 1 tablet by mouth at bedtime.   pantoprazole (PROTONIX) 40 MG tablet TAKE 1 TABLET BY MOUTH DAILY   PHENobarbital (LUMINAL) 64.8 MG tablet Take 2 tablets (129.6 mg total) by mouth at bedtime.   potassium chloride SA (KLOR-CON M) 20 MEQ tablet Take 1 tablet (20 mEq total) by mouth daily.   rosuvastatin (CRESTOR) 40 MG tablet Take 1 tablet (40 mg total) by mouth daily.   senna-docusate (SENOKOT S) 8.6-50 MG tablet Take 2 tablets by mouth daily.   sevelamer carbonate (RENVELA) 800 MG tablet Take 1 tablet (800 mg total) by mouth  3 (three) times daily with meals.   torsemide (DEMADEX) 20 MG tablet Take 3 tablets (60 mg total) by mouth daily. TAKE 2 TABLETS BY MOUTH DAILY AND AN EXTRA DOSE(2 TABLETS) IN THE PM FOR SWELLING OR SHORTNESS OF BREATH   zonisamide (ZONEGRAN) 100 MG capsule TAKE 2 CAPSULES BY MOUTH AT NIGHT     Allergies:   Ibuprofen and Lactose intolerance (gi)   ROS:   Please see the history of present illness.    All other systems reviewed and are negative.  EKGs/Labs/Other Studies Reviewed:    The following studies were reviewed today: Cardiac Studies & Procedures   CARDIAC  CATHETERIZATION  CARDIAC CATHETERIZATION 02/01/2020  Narrative Findings:  Ao = 108/56 (76) LV = 136/4 RA =  2 RV = 27/7 PA = 30/2 (13) PCW = 5 Fick cardiac output/index = 4.3/2.7 Thermo CO/CI = 4.1/2.5 PVR = 2.0 WU FA sat = 99% PA sat = 66% No RV/LV interaction AoV: mean gradient AVA 1.47 cm2 (Fick) 1.38 cm2 (Thermo)  Assessment: 1. Low filling pressures with normal output 2. Moderate aortic stenosis  Plan/Discussion:  Continue hydration.  Arvilla Meres, MD 6:56 PM    ECHOCARDIOGRAM  ECHOCARDIOGRAM COMPLETE 08/31/2022  Narrative ECHOCARDIOGRAM REPORT    Patient Name:   Sherry [name] Date of Exam: 08/31/2022 Medical Rec #:  [id number]           Height:       61.0 in Accession #:    [id number]          Weight:       186.3 lb Date of Birth:  [birth year]           BSA:          1.833 m Patient Age:    73 years            BP:           155/100 mmHg Patient Gender: F                   HR:           90 bpm. Exam Location:  Inpatient  Procedure: 2D Echo, Cardiac Doppler and Color Doppler  Indications:    Congestive Heart Failure  History:        Patient has prior history of Echocardiogram examinations, most recent 11/02/2021. CAD, TIA, Aortic Valve Disease, Signs/Symptoms:Hypotension; Risk Factors:Hypertension, Dyslipidemia and Former Smoker.  Sonographer:    Raeford Razor Referring Phys: 41 EMILY B MULLEN   Sonographer Comments: Image acquisition challenging due to patient body habitus. IMPRESSIONS   1. Left ventricular ejection fraction, by estimation, is 60 to 65%. The left ventricle has normal function. The left ventricle has no regional wall motion abnormalities. There is severe concentric left ventricular hypertrophy. Indeterminate diastolic filling due to E-A fusion. 2. Right ventricular systolic function is normal. The right ventricular size is normal. Tricuspid regurgitation signal is inadequate for assessing PA pressure. 3. Left atrial  size was moderately dilated. 4. A small to moderate pericardial effusion is present. The pericardial effusion is circumferential. Large pleural effusion in the left lateral region. 5. The mitral valve is abnormal. Trivial mitral valve regurgitation. No evidence of mitral stenosis. Severe mitral annular calcification. 6. The aortic valve is tricuspid. There is severe calcifcation of the aortic valve. There is severe thickening of the aortic valve. Aortic valve regurgitation is mild. Severe aortic valve stenosis. Aortic valve area, by VTI measures 0.62 cm. Aortic valve mean gradient  measures 50.0 mmHg. Aortic valve Vmax measures 4.56 m/s. 7. The inferior vena cava is dilated in size with >50% respiratory variability, suggesting right atrial pressure of 8 mmHg.  Comparison(s): Changes from prior study are noted. Severe AS now. Speckled appearance of myocardium and severe AS suggestive of Amyloid but prior PYP scans are negative.  FINDINGS Left Ventricle: Left ventricular ejection fraction, by estimation, is 60 to 65%. The left ventricle has normal function. The left ventricle has no regional wall motion abnormalities. The left ventricular internal cavity size was normal in size. There is severe concentric left ventricular hypertrophy. Indeterminate diastolic filling due to E-A fusion.  Right Ventricle: The right ventricular size is normal. No increase in right ventricular wall thickness. Right ventricular systolic function is normal. Tricuspid regurgitation signal is inadequate for assessing PA pressure.  Left Atrium: Left atrial size was moderately dilated.  Right Atrium: Right atrial size was normal in size.  Pericardium: A small pericardial effusion is present. The pericardial effusion is circumferential.  Mitral Valve: The mitral valve is abnormal. Severe mitral annular calcification. Trivial mitral valve regurgitation. No evidence of mitral valve stenosis.  Tricuspid Valve: The tricuspid  valve is normal in structure. Tricuspid valve regurgitation is trivial. No evidence of tricuspid stenosis.  Aortic Valve: The aortic valve is tricuspid. There is severe calcifcation of the aortic valve. There is severe thickening of the aortic valve. Aortic valve regurgitation is mild. Severe aortic stenosis is present. Aortic valve mean gradient measures 50.0 mmHg. Aortic valve peak gradient measures 83.2 mmHg. Aortic valve area, by VTI measures 0.62 cm.  Pulmonic Valve: The pulmonic valve was normal in structure. Pulmonic valve regurgitation is trivial. No evidence of pulmonic stenosis.  Aorta: The aortic root and ascending aorta are structurally normal, with no evidence of dilitation.  Venous: The inferior vena cava is dilated in size with greater than 50% respiratory variability, suggesting right atrial pressure of 8 mmHg.  IAS/Shunts: No atrial level shunt detected by color flow Doppler.  Additional Comments: There is a large pleural effusion in the left lateral region.   LEFT VENTRICLE PLAX 2D LVIDd:         3.90 cm   Diastology LVIDs:         2.70 cm   LV e' lateral:   9.68 cm/s LV PW:         1.80 cm   LV E/e' lateral: 21.9 LV IVS:        1.80 cm LVOT diam:     2.00 cm LV SV:         63 LV SV Index:   34 LVOT Area:     3.14 cm   RIGHT VENTRICLE          IVC RV Basal diam:  3.10 cm  IVC diam: 2.40 cm TAPSE (M-mode): 2.2 cm  LEFT ATRIUM            Index        RIGHT ATRIUM           Index LA diam:      4.60 cm  2.51 cm/m   RA Area:     16.10 cm LA Vol (A2C): 143.0 ml 78.03 ml/m  RA Volume:   43.40 ml  23.68 ml/m LA Vol (A4C): 71.8 ml  39.18 ml/m AORTIC VALVE AV Area (Vmax):    0.73 cm AV Area (Vmean):   0.68 cm AV Area (VTI):     0.62 cm AV Vmax:  456.00 cm/s AV Vmean:          344.000 cm/s AV VTI:            1.020 m AV Peak Grad:      83.2 mmHg AV Mean Grad:      50.0 mmHg LVOT Vmax:         106.00 cm/s LVOT Vmean:        74.600 cm/s LVOT VTI:           0.201 m LVOT/AV VTI ratio: 0.20  AORTA Ao Root diam: 2.40 cm Ao Asc diam:  3.30 cm  MITRAL VALVE MV Area (PHT): 5.16 cm     SHUNTS MV Decel Time: 147 msec     Systemic VTI:  0.20 m MV E velocity: 212.00 cm/s  Systemic Diam: 2.00 cm  Vishnu Priya Mallipeddi Electronically signed by Winfield Rast Mallipeddi Signature Date/Time: 08/31/2022/4:02:55 PM    Final   MONITORS  LONG TERM MONITOR (3-14 DAYS) 10/29/2019  Narrative 1. The basic rhythm is normal sinus with an average HR of 51 bpm with first degree AV block (no second or third degree AV block present) 2. No atrial fibrillation or flutter 3. No high-grade heart block or pathologic pauses 4. There are rare PVC's and no supraventricular beats 5. There is a 16 beat run of NSVT  CT SCANS  CT CORONARY MORPH W/CTA COR W/SCORE 01/31/2021  Addendum 01/31/2021  3:53 PM ADDENDUM REPORT: 01/31/2021 15:50  CLINICAL DATA:  Severe Aortic Stenosis.  EXAM: Cardiac TAVR CT  TECHNIQUE: A non-contrast, gated CT scan was obtained with axial slices of 3 mm through the heart for aortic valve calcium scoring. A 90 kV retrospective, gated, contrast cardiac scan was obtained. Gantry rotation speed was 250 msecs and collimation was 0.6 mm. Nitroglycerin was not given. The 3D data set was reconstructed in 5% intervals of the 0-95% of the R-R cycle. Systolic and diastolic phases were analyzed on a dedicated workstation using MPR, MIP, and VRT modes. The patient received 100 cc of contrast.  FINDINGS: Image quality: Excellent.  Noise artifact is: Limited.  Valve Morphology: The aortic valve is tricuspid. The leaflets are severely calcified. There are diffuse calcifications. The leaflets demonstrate severely reduced movement in systole. Findings consistent with severe aortic stenosis.  Aortic Valve Calcium score: 988  Aortic annular dimension:  Phase assessed: 15%  Annular area: 330 mm2  Annular perimeter: 65.3 mm  Max  diameter: 22.2 mm  Min diameter: 20.4 mm  Annular and subannular calcification: None.  Optimal coplanar projection: LAO 9 CAU 15  Coronary Artery Height above Annulus:  Left Main: 12.8 mm  Right Coronary: 18.6 mm  Sinus of Valsalva Measurements:  Non-coronary: 28.0 mm  Right-coronary: 28.0 mm  Left-coronary: 29.6 mm  Sinus of Valsalva Height:  Non-coronary: 20.5 mm  Right-coronary: 24.5 mm  Left-coronary: 21.8 mm  Sinotubular Junction: 27.1 mm  Ascending Thoracic Aorta: 33.9 mm  Coronary Arteries: Normal coronary origin. Right dominance. The study was performed without use of NTG and is insufficient for plaque evaluation. Please refer to recent cardiac catheterization for coronary assessment. Severe 3-vessel coronary calcifications noted.  Cardiac Morphology:  Right Atrium: Right atrial size is within normal limits.  Right Ventricle: The right ventricular cavity is within normal limits.  Left Atrium: Left atrial size is normal in size with no left atrial appendage filling defect.  Left Ventricle: The ventricular cavity size is within normal limits. There are no stigmata of prior infarction. There is no abnormal  filling defect.  Pulmonary arteries: Normal in size without proximal filling defect.  Pulmonary veins: Normal pulmonary venous drainage.  Pericardium: Small pericardial effusion.  Mitral Valve: The mitral valve is degenerative with severe mitral annular calcification.  Extra-cardiac findings: See attached radiology report for non-cardiac structures.  IMPRESSION: 1. Small annular measurements noted (330 mm2). This supports a 26 mm evlot pro TAVR with appropriate sinus measurements and sinus heights.  2. No significant annular or subannular calcifications.  3. Sufficient coronary to annulus distance.  4. Optimal Fluoroscopic Angle for Delivery: LAO 9 CAU 15  [place] T. Flora Lipps, MD   Electronically Signed By: Lennie Odor M.D. On:  01/31/2021 15:50  Narrative EXAM: OVER-READ INTERPRETATION  CT CHEST  The following report is an over-read performed by radiologist Dr. Trudie Reed of [facility] on 01/31/2021. This over-read does not include interpretation of cardiac or coronary anatomy or pathology. The coronary calcium score/coronary CTA interpretation by the cardiologist is attached.  COMPARISON:  None.  FINDINGS: Extracardiac findings will be described separately under dictation for contemporaneously obtained CTA chest, abdomen and pelvis.  IMPRESSION: Please see separate dictation for contemporaneously obtained CTA chest, abdomen and pelvis dated 01/31/2021 for full description of relevant extracardiac findings.  Electronically Signed: By: Trudie Reed M.D. On: 01/31/2021 11:26  CARDIAC MRI  MR CARDIAC MORPHOLOGY W WO CONTRAST 12/24/2017  Narrative EXAM: CARDIAC MRI  TECHNIQUE: The patient was scanned on a 1.5 Tesla GE magnet. A dedicated cardiac coil was used. Functional imaging was done using Fiesta sequences. 2,3, and 4 chamber views were done to assess for RWMA's. Modified Simpson's rule using a short axis stack was used to calculate an ejection fraction on a dedicated work [employer]. The patient received 7 cc of Gadavist. After 10 minutes inversion recovery sequences were used to assess for infiltration and scar tissue.  CONTRAST:  7 cc Gadavist  FINDINGS: Limited images of the lung fields showed no gross abnormalities.  There was a moderate, circumferential pericardial effusion without evidence for tamponade.  There was normal left ventricular size with moderate concentric LV hypertrophy, EF 76% with no wall motion abnormalities. Normal right ventricular size and systolic function, EF 46%. Mild left atrial enlargement. Normal right atrial size. There was mitral annular calcification with mild mitral regurgitation. The aortic valve  was calcified and restricted. Degree of stenosis is better estimated by echo.  On delayed enhancement imaging, the myocardium was difficult to null. There was a suggestion of diffuse subendocardial late gadolinium enhancement (LGE).  Measurements:  LVEDV 155 mL  LVSV 118 mL  LVEF 76%  RVEDV 124 mL  RVSV 57 mL  RVEF 46%  IMPRESSION: 1. This study is suggestive of cardiac amyloidosis. There is a moderate pericardial effusion (without tamponade), moderate concentric LV hypertrophy, and on delayed enhancement imaging, the myocardium is difficult to null with a suggestion of diffuse subendocardial LGE.  2. There is aortic stenosis present, the degree would be better assessed by echo.  Sherry [name]   Electronically Signed By: Marca Ancona M.D. On: 12/24/2017 21:26  PYP SCAN  MYOCARDIAL AMYLOID PLANAR AND SPECT 01/09/2018  Narrative  H/CL ratio is 1.08.  By semi-quantitative assessment there is Grade 0 that represents no uptake and normal bone uptake.  There are hot spots with increased radiotracer uptake in both kidneys. Further evaluation suggested  The study is normal and not suggestive of TRR amyloidosis.        EKG:  Recent Labs: 08/30/2022: TSH 6.844 02/03/2023: B Natriuretic Peptide 2,927.1 02/25/2023: ALT 61 03/01/2023: Magnesium 2.4 03/02/2023: BUN 88; Creatinine, Ser 2.88; Hemoglobin 9.9; Platelets 209; Potassium 3.3; Sodium 138  Recent Lipid Panel    Component Value Date/Time   CHOL 170 02/14/2022 1041   CHOL 179 01/26/2021 0735   TRIG 72 02/14/2022 1041   HDL 64 02/14/2022 1041   HDL 64 01/26/2021 0735   CHOLHDL 2.7 02/14/2022 1041   VLDL 14 02/14/2022 1041   LDLCALC 92 02/14/2022 1041   LDLCALC 100 (H) 01/26/2021 0735           Physical Exam:    VS:  BP (!) 142/94   Pulse 67   Ht 5\' 1"  (1.549 m)   Wt 144 lb (65.3 kg) Comment: Per patient "unable to stand"  SpO2 99%   BMI 27.21 kg/m     Wt Readings from Last 3  Encounters:  03/14/23 144 lb (65.3 kg)  02/28/23 144 lb (65.3 kg)  11/11/22 170 lb (77.1 kg)     GEN: Elderly, wheelchair-bound woman in no distress HEENT: Normal NECK: No JVD; BL carotid bruits LYMPHATICS: No lymphadenopathy CARDIAC: RRR, 4/6 systolic murmur at the RUSB RESPIRATORY:  Clear to auscultation without rales, wheezing or rhonchi  ABDOMEN: Soft, non-tender, non-distended MUSCULOSKELETAL:  1+ bilateral pretibial edema; No deformity  SKIN: Warm and dry NEUROLOGIC:  Alert and oriented x 3 PSYCHIATRIC:  Normal affect   Assessment & Plan Chronic diastolic congestive heart failure (HCC) Continue torsemide at current dose.  No clear signs of volume overload on her exam at present.  She has severe LVH and severe aortic stenosis with a palliative approach to her care. Nonrheumatic aortic valve stenosis Patient with severe aortic stenosis.  Not a candidate for intervention due to nonambulatory status, stage V chronic kidney disease, high risk of complete heart block, and multiple medical problems. Coronary artery disease involving native coronary artery of native heart without angina pectoris Not currently having any anginal symptoms.  Continue aspirin 81 mg daily. CKD (chronic kidney disease) stage 5, GFR less than 15 ml/min (HCC) Followed by primary care and nephrology.      Medication Adjustments/Labs and Tests Ordered: Current medicines are reviewed at length with the patient today.  Concerns regarding medicines are outlined above.  No orders of the defined types were placed in this encounter.  No orders of the defined types were placed in this encounter.   Patient Instructions  Follow-Up: At [facility], you and your health needs are our priority.  As part of our continuing mission to provide you with exceptional heart care, we have created designated Provider Care Teams.  These Care Teams include your primary Cardiologist (physician) and Advanced Practice  Providers (APPs -  Physician Assistants and Nurse Practitioners) who all work together to provide you with the care you need, when you need it.   Your next appointment:   6 month(s)  Provider:   Tonny Bollman, MD     Other Instructions   1st Floor: - Lobby - Registration  - Pharmacy  - Lab - Cafe  2nd Floor: - PV Lab - Diagnostic Testing (echo, CT, nuclear med)  3rd Floor: - Vacant  4th Floor: - TCTS (cardiothoracic surgery) - AFib Clinic - Structural Heart Clinic - Vascular Surgery  - Vascular Ultrasound  5th Floor: - HeartCare Cardiology (general and EP) - Clinical Pharmacy for coumadin, hypertension, lipid, weight-loss medications, and med management appointments    Valet parking services will  be available as well.          Signed, Tonny Bollman, MD  03/14/2023 1:31 PM    [facility]

## 2023-03-14 NOTE — Patient Instructions (Signed)
Follow-Up: At [facility], you and your health needs are our priority.  As part of our continuing mission to provide you with exceptional heart care, we have created designated Provider Care Teams.  These Care Teams include your primary Cardiologist (physician) and Advanced Practice Providers (APPs -  Physician Assistants and Nurse Practitioners) who all work together to provide you with the care you need, when you need it.   Your next appointment:   6 month(s)  Provider:   Tonny Bollman, MD     Other Instructions   1st Floor: - Lobby - Registration  - Pharmacy  - Lab - Cafe  2nd Floor: - PV Lab - Diagnostic Testing (echo, CT, nuclear med)  3rd Floor: - Vacant  4th Floor: - TCTS (cardiothoracic surgery) - AFib Clinic - Structural Heart Clinic - Vascular Surgery  - Vascular Ultrasound  5th Floor: - HeartCare Cardiology (general and EP) - Clinical Pharmacy for coumadin, hypertension, lipid, weight-loss medications, and med management appointments    Valet parking services will be available as well.

## 2023-03-14 NOTE — Assessment & Plan Note (Signed)
Followed by primary care and nephrology.

## 2023-03-18 DIAGNOSIS — H538 Other visual disturbances: Secondary | ICD-10-CM | POA: Diagnosis not present

## 2023-03-18 DIAGNOSIS — D631 Anemia in chronic kidney disease: Secondary | ICD-10-CM | POA: Diagnosis not present

## 2023-03-18 DIAGNOSIS — I5030 Unspecified diastolic (congestive) heart failure: Secondary | ICD-10-CM | POA: Diagnosis not present

## 2023-03-18 DIAGNOSIS — N179 Acute kidney failure, unspecified: Secondary | ICD-10-CM | POA: Diagnosis not present

## 2023-03-18 DIAGNOSIS — D61818 Other pancytopenia: Secondary | ICD-10-CM | POA: Diagnosis not present

## 2023-03-18 DIAGNOSIS — M47816 Spondylosis without myelopathy or radiculopathy, lumbar region: Secondary | ICD-10-CM | POA: Diagnosis not present

## 2023-03-18 DIAGNOSIS — I051 Rheumatic mitral insufficiency: Secondary | ICD-10-CM | POA: Diagnosis not present

## 2023-03-18 DIAGNOSIS — E785 Hyperlipidemia, unspecified: Secondary | ICD-10-CM | POA: Diagnosis not present

## 2023-03-18 DIAGNOSIS — N390 Urinary tract infection, site not specified: Secondary | ICD-10-CM | POA: Diagnosis not present

## 2023-03-18 DIAGNOSIS — I2510 Atherosclerotic heart disease of native coronary artery without angina pectoris: Secondary | ICD-10-CM | POA: Diagnosis not present

## 2023-03-18 DIAGNOSIS — K644 Residual hemorrhoidal skin tags: Secondary | ICD-10-CM | POA: Diagnosis not present

## 2023-03-18 DIAGNOSIS — K219 Gastro-esophageal reflux disease without esophagitis: Secondary | ICD-10-CM | POA: Diagnosis not present

## 2023-03-18 DIAGNOSIS — J45909 Unspecified asthma, uncomplicated: Secondary | ICD-10-CM | POA: Diagnosis not present

## 2023-03-18 DIAGNOSIS — I130 Hypertensive heart and chronic kidney disease with heart failure and stage 1 through stage 4 chronic kidney disease, or unspecified chronic kidney disease: Secondary | ICD-10-CM | POA: Diagnosis not present

## 2023-03-18 DIAGNOSIS — K5900 Constipation, unspecified: Secondary | ICD-10-CM | POA: Diagnosis not present

## 2023-03-18 DIAGNOSIS — M810 Age-related osteoporosis without current pathological fracture: Secondary | ICD-10-CM | POA: Diagnosis not present

## 2023-03-18 DIAGNOSIS — E1122 Type 2 diabetes mellitus with diabetic chronic kidney disease: Secondary | ICD-10-CM | POA: Diagnosis not present

## 2023-03-18 DIAGNOSIS — D62 Acute posthemorrhagic anemia: Secondary | ICD-10-CM | POA: Diagnosis not present

## 2023-03-18 DIAGNOSIS — N2581 Secondary hyperparathyroidism of renal origin: Secondary | ICD-10-CM | POA: Diagnosis not present

## 2023-03-18 DIAGNOSIS — M170 Bilateral primary osteoarthritis of knee: Secondary | ICD-10-CM | POA: Diagnosis not present

## 2023-03-18 DIAGNOSIS — I440 Atrioventricular block, first degree: Secondary | ICD-10-CM | POA: Diagnosis not present

## 2023-03-18 DIAGNOSIS — N184 Chronic kidney disease, stage 4 (severe): Secondary | ICD-10-CM | POA: Diagnosis not present

## 2023-03-18 DIAGNOSIS — I700 Atherosclerosis of aorta: Secondary | ICD-10-CM | POA: Diagnosis not present

## 2023-03-19 DIAGNOSIS — I5030 Unspecified diastolic (congestive) heart failure: Secondary | ICD-10-CM | POA: Diagnosis not present

## 2023-03-19 DIAGNOSIS — N184 Chronic kidney disease, stage 4 (severe): Secondary | ICD-10-CM | POA: Diagnosis not present

## 2023-03-19 DIAGNOSIS — K644 Residual hemorrhoidal skin tags: Secondary | ICD-10-CM | POA: Diagnosis not present

## 2023-03-19 DIAGNOSIS — J45909 Unspecified asthma, uncomplicated: Secondary | ICD-10-CM | POA: Diagnosis not present

## 2023-03-19 DIAGNOSIS — K219 Gastro-esophageal reflux disease without esophagitis: Secondary | ICD-10-CM | POA: Diagnosis not present

## 2023-03-19 DIAGNOSIS — I440 Atrioventricular block, first degree: Secondary | ICD-10-CM | POA: Diagnosis not present

## 2023-03-19 DIAGNOSIS — M47816 Spondylosis without myelopathy or radiculopathy, lumbar region: Secondary | ICD-10-CM | POA: Diagnosis not present

## 2023-03-19 DIAGNOSIS — N2581 Secondary hyperparathyroidism of renal origin: Secondary | ICD-10-CM | POA: Diagnosis not present

## 2023-03-19 DIAGNOSIS — D61818 Other pancytopenia: Secondary | ICD-10-CM | POA: Diagnosis not present

## 2023-03-19 DIAGNOSIS — D631 Anemia in chronic kidney disease: Secondary | ICD-10-CM | POA: Diagnosis not present

## 2023-03-19 DIAGNOSIS — H538 Other visual disturbances: Secondary | ICD-10-CM | POA: Diagnosis not present

## 2023-03-19 DIAGNOSIS — N179 Acute kidney failure, unspecified: Secondary | ICD-10-CM | POA: Diagnosis not present

## 2023-03-19 DIAGNOSIS — D62 Acute posthemorrhagic anemia: Secondary | ICD-10-CM | POA: Diagnosis not present

## 2023-03-19 DIAGNOSIS — K5900 Constipation, unspecified: Secondary | ICD-10-CM | POA: Diagnosis not present

## 2023-03-19 DIAGNOSIS — M810 Age-related osteoporosis without current pathological fracture: Secondary | ICD-10-CM | POA: Diagnosis not present

## 2023-03-19 DIAGNOSIS — I2510 Atherosclerotic heart disease of native coronary artery without angina pectoris: Secondary | ICD-10-CM | POA: Diagnosis not present

## 2023-03-19 DIAGNOSIS — I130 Hypertensive heart and chronic kidney disease with heart failure and stage 1 through stage 4 chronic kidney disease, or unspecified chronic kidney disease: Secondary | ICD-10-CM | POA: Diagnosis not present

## 2023-03-19 DIAGNOSIS — I051 Rheumatic mitral insufficiency: Secondary | ICD-10-CM | POA: Diagnosis not present

## 2023-03-19 DIAGNOSIS — I700 Atherosclerosis of aorta: Secondary | ICD-10-CM | POA: Diagnosis not present

## 2023-03-19 DIAGNOSIS — E1122 Type 2 diabetes mellitus with diabetic chronic kidney disease: Secondary | ICD-10-CM | POA: Diagnosis not present

## 2023-03-19 DIAGNOSIS — E785 Hyperlipidemia, unspecified: Secondary | ICD-10-CM | POA: Diagnosis not present

## 2023-03-19 DIAGNOSIS — N390 Urinary tract infection, site not specified: Secondary | ICD-10-CM | POA: Diagnosis not present

## 2023-03-19 DIAGNOSIS — M170 Bilateral primary osteoarthritis of knee: Secondary | ICD-10-CM | POA: Diagnosis not present

## 2023-03-20 ENCOUNTER — Ambulatory Visit (INDEPENDENT_AMBULATORY_CARE_PROVIDER_SITE_OTHER): Payer: 59 | Admitting: Student

## 2023-03-20 ENCOUNTER — Encounter: Payer: 59 | Admitting: Dietician

## 2023-03-20 VITALS — BP 139/56 | HR 69 | Temp 98.0°F | Ht 61.0 in

## 2023-03-20 DIAGNOSIS — K219 Gastro-esophageal reflux disease without esophagitis: Secondary | ICD-10-CM | POA: Diagnosis not present

## 2023-03-20 DIAGNOSIS — I2510 Atherosclerotic heart disease of native coronary artery without angina pectoris: Secondary | ICD-10-CM | POA: Diagnosis not present

## 2023-03-20 DIAGNOSIS — D631 Anemia in chronic kidney disease: Secondary | ICD-10-CM | POA: Diagnosis not present

## 2023-03-20 DIAGNOSIS — M170 Bilateral primary osteoarthritis of knee: Secondary | ICD-10-CM | POA: Diagnosis not present

## 2023-03-20 DIAGNOSIS — D649 Anemia, unspecified: Secondary | ICD-10-CM | POA: Diagnosis not present

## 2023-03-20 DIAGNOSIS — D62 Acute posthemorrhagic anemia: Secondary | ICD-10-CM | POA: Diagnosis not present

## 2023-03-20 DIAGNOSIS — N179 Acute kidney failure, unspecified: Secondary | ICD-10-CM | POA: Diagnosis not present

## 2023-03-20 DIAGNOSIS — K644 Residual hemorrhoidal skin tags: Secondary | ICD-10-CM | POA: Diagnosis not present

## 2023-03-20 DIAGNOSIS — J45909 Unspecified asthma, uncomplicated: Secondary | ICD-10-CM | POA: Diagnosis not present

## 2023-03-20 DIAGNOSIS — I700 Atherosclerosis of aorta: Secondary | ICD-10-CM | POA: Diagnosis not present

## 2023-03-20 DIAGNOSIS — E1122 Type 2 diabetes mellitus with diabetic chronic kidney disease: Secondary | ICD-10-CM | POA: Diagnosis not present

## 2023-03-20 DIAGNOSIS — I5030 Unspecified diastolic (congestive) heart failure: Secondary | ICD-10-CM | POA: Diagnosis not present

## 2023-03-20 DIAGNOSIS — N184 Chronic kidney disease, stage 4 (severe): Secondary | ICD-10-CM

## 2023-03-20 DIAGNOSIS — K5900 Constipation, unspecified: Secondary | ICD-10-CM | POA: Diagnosis not present

## 2023-03-20 DIAGNOSIS — L0291 Cutaneous abscess, unspecified: Secondary | ICD-10-CM | POA: Diagnosis not present

## 2023-03-20 DIAGNOSIS — I130 Hypertensive heart and chronic kidney disease with heart failure and stage 1 through stage 4 chronic kidney disease, or unspecified chronic kidney disease: Secondary | ICD-10-CM | POA: Diagnosis not present

## 2023-03-20 DIAGNOSIS — M810 Age-related osteoporosis without current pathological fracture: Secondary | ICD-10-CM | POA: Diagnosis not present

## 2023-03-20 DIAGNOSIS — E785 Hyperlipidemia, unspecified: Secondary | ICD-10-CM | POA: Diagnosis not present

## 2023-03-20 DIAGNOSIS — N390 Urinary tract infection, site not specified: Secondary | ICD-10-CM | POA: Diagnosis not present

## 2023-03-20 DIAGNOSIS — N185 Chronic kidney disease, stage 5: Secondary | ICD-10-CM

## 2023-03-20 DIAGNOSIS — H538 Other visual disturbances: Secondary | ICD-10-CM | POA: Diagnosis not present

## 2023-03-20 DIAGNOSIS — I350 Nonrheumatic aortic (valve) stenosis: Secondary | ICD-10-CM

## 2023-03-20 DIAGNOSIS — N2581 Secondary hyperparathyroidism of renal origin: Secondary | ICD-10-CM | POA: Diagnosis not present

## 2023-03-20 DIAGNOSIS — D61818 Other pancytopenia: Secondary | ICD-10-CM | POA: Diagnosis not present

## 2023-03-20 DIAGNOSIS — I051 Rheumatic mitral insufficiency: Secondary | ICD-10-CM | POA: Diagnosis not present

## 2023-03-20 DIAGNOSIS — I440 Atrioventricular block, first degree: Secondary | ICD-10-CM | POA: Diagnosis not present

## 2023-03-20 DIAGNOSIS — M47816 Spondylosis without myelopathy or radiculopathy, lumbar region: Secondary | ICD-10-CM | POA: Diagnosis not present

## 2023-03-20 NOTE — Assessment & Plan Note (Signed)
Recently seen by cardiology, not a surgical candidate for valvular intervention.  Will continue optimizing management.

## 2023-03-20 NOTE — Assessment & Plan Note (Addendum)
This is a 78 year old female with CKD 5, previously on Lasix -most recently discharged with torsemide 20 mg 3 times daily.  She endorses since leaving the hospital she has had a little increased welling, but denies any shortness of breath.  She is taking her medications consistently, but states that she has been urinating a lot.  Patient states they preferred furosemide to torsemide as not to keep him up in the night peeing as much.  Plan: 1+ edema to midshin today, will continue torsemide RFP

## 2023-03-20 NOTE — Patient Instructions (Addendum)
Thank you, Ms.[name] for allowing [place] to provide your care today.  I have ordered the following tests for you:  Lab Orders         CBC no Diff         Renal function panel       Follow up: 2 months for routine follow up.   Please remember: I will call your daughter with your results. Please make sure you are still taking your Protonix (Pantoprazole).    We look forward to seeing you next time. Please call our clinic at [phone] if you have any questions or concerns. The best time to call is Monday-Friday from 9am-4pm, but there is someone available 24/7. If after hours or the weekend, call the main hospital number and ask for the Internal Medicine Resident On-Call. If you need medication refills, please notify your pharmacy one week in advance and they will send [place] a request.   Thank you for trusting me with your care. Wishing you the best!  Lovie Macadamia MD [facility]

## 2023-03-20 NOTE — Assessment & Plan Note (Signed)
Patient endorses lesion on the end of the right gluteal cleft.  She states that it spontaneously ruptured and drained purulent fluid, she is on the pain and swelling has resolved.  On exam today no clear lesion could be identified, however a there was a firm area of tissues like reflects the presumed abscess.

## 2023-03-20 NOTE — Progress Notes (Signed)
Subjective:  CC: Hospital follow-up  HPI:  [name] is a 78 y.o. person with a past medical history stated below and presents today for the stated chief complaint. Please see problem based assessment and plan for additional details.  Past Medical History:  Diagnosis Date   AKI (acute kidney injury) (HCC) 01/31/2020   Anemia    Aortic stenosis    Arthritis    Bilateral lower extremity edema    Bilateral lower extremity edema    Blurry vision, bilateral 05/08/2016   Bradycardia    a. 01/2013 - asymptomatic.   Chronic diastolic heart failure (HCC)    Chronic sinus bradycardia 07/02/2012   CKD (chronic kidney disease)    a. baseline CKD stage III   Coronary artery disease    Electrolyte abnormality 08/30/2022   Epilepsy (HCC) 06/04/2006   EEG 02/19/12 " Interpretation:  This is an abnormal EEG demonstrating continuous and generalized slowing of the background in the theta frequency. Irregular rhythm of EKG noted.  Clinical correlation: the generalized slowing is consistent with a mild encephalopathy of nonspecific etiologies for which the differential would include infectious, toxic, metabolic, inflammatory, and vascular etiologies.    GERD (gastroesophageal reflux disease)    Headache(784.0)    Heart block AV second degree 02/15/2013   Heart murmur    Hyperlipidemia    Hypertension    Hypocalcemia 08/27/2022   Patient with chronic hypocalcemia, found to have a critical calcium of 5.8 on CMP today.  Albumin 2.7, corrected calcium 6.8.  Likely multifactorial due to CKD, vitamin D deficiency.  This was discussed extensively with the patient and family.  Risks and benefits of inpatient management discussed and family decided to continue with current plan of outpatient management with close follow-up.  Pleas   Hypotension    Hypovolemic shock (HCC)    Internal and external hemorrhoids without complication 01/20/2012   Colonoscopy in 2008 recs repeat in 5 years    Intractable focal epilepsy with impairment of consciousness (HCC) 01/02/2012   Late effects of cerebrovascular disease 01/23/2012   MRI Brain -Calvarium/skull base: No focal marrow replacing lesion suggestive of neoplasm. -Orbits: Grossly unremarkable. -Paranasal sinuses: Imaged portions clear. -Brain: No acute abnormalities such as hemorrhage, hydrocephalus, acute ischemia, or evidence of a mass lesion.  There is patchy bilateral cerebral white matter T2 and flair signal hyper intensities.  Centimeter or smaller CSF intensity   Mobitz (type) I (Wenckebach's) atrioventricular block    a. 01/2013 - asymptomatic.   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   TIA (transient ischemic attack) 2014   pt stated she had "mini strokes"   UTI (urinary tract infection) 03/07/2022   Vitamin D deficiency 04/22/2014    Current Outpatient Medications on File Prior to Visit  Medication Sig Dispense Refill   acetaminophen (TYLENOL) 325 MG tablet Take 2 tablets (650 mg total) by mouth every 6 (six) hours as needed for mild pain (or Fever >/= 101). 100 tablet 0   albuterol (PROVENTIL) (2.5 MG/3ML) 0.083% nebulizer solution Take 3 mLs (2.5 mg total) by nebulization every 6 (six) hours as needed for wheezing. 90 mL 12   aspirin 81 MG chewable tablet Chew 1 tablet (81 mg total) by mouth daily. 90 tablet 0   calcium carbonate (OSCAL) 1500 (600 Ca) MG TABS tablet  Take 1 tablet (1,500 mg total) by mouth 3 (three) times daily with meals. 90 tablet 0   cholecalciferol (VITAMIN D3) 25 MCG (1000 UNIT) tablet Take 1 tablet (1,000 Units total) by mouth daily. 30 tablet 11   febuxostat (ULORIC) 40 MG tablet Take 20 mg by mouth daily.     hydrALAZINE (APRESOLINE) 100 MG tablet TAKE 1 TABLET(100 MG) BY MOUTH THREE TIMES DAILY 90 tablet 11   JARDIANCE 10 MG TABS  tablet TAKE 1 TABLET BY MOUTH DAILY  BEFORE BREAKFAST 90 tablet 3   lidocaine (LMX) 4 % cream Apply 1 Application topically 2 (two) times daily as needed (Pain on sacral wound area). 30 g 0   multivitamin (RENA-VIT) TABS tablet Take 1 tablet by mouth at bedtime. 100 tablet 0   pantoprazole (PROTONIX) 40 MG tablet TAKE 1 TABLET BY MOUTH DAILY 100 tablet 2   PHENobarbital (LUMINAL) 64.8 MG tablet Take 2 tablets (129.6 mg total) by mouth at bedtime. 200 tablet 3   potassium chloride SA (KLOR-CON M) 20 MEQ tablet Take 1 tablet (20 mEq total) by mouth daily. 90 tablet 3   rosuvastatin (CRESTOR) 40 MG tablet Take 1 tablet (40 mg total) by mouth daily. 90 tablet 3   senna-docusate (SENOKOT S) 8.6-50 MG tablet Take 2 tablets by mouth daily. 180 tablet 0   sevelamer carbonate (RENVELA) 800 MG tablet Take 1 tablet (800 mg total) by mouth 3 (three) times daily with meals. 90 tablet 11   torsemide (DEMADEX) 20 MG tablet Take 3 tablets (60 mg total) by mouth daily. TAKE 2 TABLETS BY MOUTH DAILY AND AN EXTRA DOSE(2 TABLETS) IN THE PM FOR SWELLING OR SHORTNESS OF BREATH 270 tablet 6   zonisamide (ZONEGRAN) 100 MG capsule TAKE 2 CAPSULES BY MOUTH AT NIGHT 200 capsule 3   No current facility-administered medications on file prior to visit.    Review of Systems: Please see assessment and plan for pertinent positives and negatives.  Objective:   Vitals:   03/20/23 1007 03/20/23 1028  BP: (!) 158/55 (!) 139/56  Pulse: 70 69  Temp: 98 F (36.7 C)   TempSrc: Oral   SpO2: 100%   Height: 5\' 1"  (1.549 m)     Physical Exam: Constitutional: Chronically ill-appearing Cardiovascular: Regular rate and rhythm. systolic ejection murmur.   Pulmonary/Chest: lungs clear to auscultation bilaterally Abdominal: soft, non-tender, non-distended Extremities: 1+ edema to the midshin bilaterally.   Psych: Pleasant affect Thought process is linear and is goal-directed.     Assessment & Plan:  CKD (chronic kidney  disease) stage 5, GFR less than 15 ml/min [facility]) This is a 78 year old female with CKD 5, previously on Lasix -most recently discharged with torsemide 20 mg 3 times daily.  She endorses since leaving the hospital she has had a little increased welling, but denies any shortness of breath.  She is taking her medications consistently, but states that she has been urinating a lot.  Patient states they preferred furosemide to torsemide as not to keep him up in the night peeing as much.  Plan: 1+ edema to midshin today, will continue torsemide RFP   Anemia Recently discharged from hospital in the setting of symptomatic anemia.  Presumed to be in the setting of GI bleed, though EGD and colonoscopy did not reveal source of bleeding.  Denies any recent bleeding, melanotic stools, lightheadedness, or dizziness. Plan: CBC Patient unsure if they are taking pantoprazole as directed, will check in with daughter regarding this, call results back.  Abscess Patient endorses lesion on the end of the right gluteal cleft.  She states that it spontaneously ruptured and drained purulent fluid, she is on the pain and swelling has resolved.  On exam today no clear lesion could be identified, however a there was a firm area of tissues like reflects the presumed abscess.  Severe aortic stenosis Recently seen by cardiology, not a surgical candidate for valvular intervention.  Will continue optimizing management.    Patient discussed with Dr. Julian Reil MD [facility]  PGY-1 Pager: [phone]  Phone: [phone] Date 03/20/2023  Time 8:58 PM

## 2023-03-20 NOTE — Assessment & Plan Note (Addendum)
Recently discharged from hospital in the setting of symptomatic anemia.  Presumed to be in the setting of GI bleed, though EGD and colonoscopy did not reveal source of bleeding.  Denies any recent bleeding, melanotic stools, lightheadedness, or dizziness. Plan: CBC Patient unsure if they are taking pantoprazole as directed, will check in with daughter regarding this, call results back.

## 2023-03-21 ENCOUNTER — Other Ambulatory Visit: Payer: Self-pay | Admitting: Internal Medicine

## 2023-03-21 DIAGNOSIS — R269 Unspecified abnormalities of gait and mobility: Secondary | ICD-10-CM

## 2023-03-21 LAB — CBC
Hematocrit: 29.6 % — ABNORMAL LOW (ref 34.0–46.6)
Hemoglobin: 9.1 g/dL — ABNORMAL LOW (ref 11.1–15.9)
MCH: 28.1 pg (ref 26.6–33.0)
MCHC: 30.7 g/dL — ABNORMAL LOW (ref 31.5–35.7)
MCV: 91 fL (ref 79–97)
Platelets: 286 10*3/uL (ref 150–450)
RBC: 3.24 x10E6/uL — ABNORMAL LOW (ref 3.77–5.28)
RDW: 17 % — ABNORMAL HIGH (ref 11.7–15.4)
WBC: 3.1 10*3/uL — ABNORMAL LOW (ref 3.4–10.8)

## 2023-03-21 LAB — RENAL FUNCTION PANEL
Albumin: 3.8 g/dL (ref 3.8–4.8)
BUN/Creatinine Ratio: 23 (ref 12–28)
BUN: 71 mg/dL — ABNORMAL HIGH (ref 8–27)
CO2: 22 mmol/L (ref 20–29)
Calcium: 8.5 mg/dL — ABNORMAL LOW (ref 8.7–10.3)
Chloride: 105 mmol/L (ref 96–106)
Creatinine, Ser: 3.09 mg/dL — ABNORMAL HIGH (ref 0.57–1.00)
Glucose: 97 mg/dL (ref 70–99)
Phosphorus: 5.8 mg/dL — ABNORMAL HIGH (ref 3.0–4.3)
Potassium: 4.7 mmol/L (ref 3.5–5.2)
Sodium: 146 mmol/L — ABNORMAL HIGH (ref 134–144)
eGFR: 15 mL/min/{1.73_m2} — ABNORMAL LOW (ref >59)

## 2023-03-24 NOTE — Progress Notes (Signed)
Internal Medicine Clinic Attending  Case discussed with the resident at the time of the visit.  We reviewed the resident's history and exam and pertinent patient test results.  I agree with the assessment, diagnosis, and plan of care documented in the resident's note with the following clarification. Patient is taking torsemide 3 tablets daily (total 60 mg), not three times daily.

## 2023-03-25 DIAGNOSIS — N179 Acute kidney failure, unspecified: Secondary | ICD-10-CM | POA: Diagnosis not present

## 2023-03-25 DIAGNOSIS — K219 Gastro-esophageal reflux disease without esophagitis: Secondary | ICD-10-CM | POA: Diagnosis not present

## 2023-03-25 DIAGNOSIS — E785 Hyperlipidemia, unspecified: Secondary | ICD-10-CM | POA: Diagnosis not present

## 2023-03-25 DIAGNOSIS — I700 Atherosclerosis of aorta: Secondary | ICD-10-CM | POA: Diagnosis not present

## 2023-03-25 DIAGNOSIS — D62 Acute posthemorrhagic anemia: Secondary | ICD-10-CM | POA: Diagnosis not present

## 2023-03-25 DIAGNOSIS — M47816 Spondylosis without myelopathy or radiculopathy, lumbar region: Secondary | ICD-10-CM | POA: Diagnosis not present

## 2023-03-25 DIAGNOSIS — K644 Residual hemorrhoidal skin tags: Secondary | ICD-10-CM | POA: Diagnosis not present

## 2023-03-25 DIAGNOSIS — M810 Age-related osteoporosis without current pathological fracture: Secondary | ICD-10-CM | POA: Diagnosis not present

## 2023-03-25 DIAGNOSIS — I2510 Atherosclerotic heart disease of native coronary artery without angina pectoris: Secondary | ICD-10-CM | POA: Diagnosis not present

## 2023-03-25 DIAGNOSIS — K5900 Constipation, unspecified: Secondary | ICD-10-CM | POA: Diagnosis not present

## 2023-03-25 DIAGNOSIS — D61818 Other pancytopenia: Secondary | ICD-10-CM | POA: Diagnosis not present

## 2023-03-25 DIAGNOSIS — I440 Atrioventricular block, first degree: Secondary | ICD-10-CM | POA: Diagnosis not present

## 2023-03-25 DIAGNOSIS — N2581 Secondary hyperparathyroidism of renal origin: Secondary | ICD-10-CM | POA: Diagnosis not present

## 2023-03-25 DIAGNOSIS — H538 Other visual disturbances: Secondary | ICD-10-CM | POA: Diagnosis not present

## 2023-03-25 DIAGNOSIS — J45909 Unspecified asthma, uncomplicated: Secondary | ICD-10-CM | POA: Diagnosis not present

## 2023-03-25 DIAGNOSIS — M170 Bilateral primary osteoarthritis of knee: Secondary | ICD-10-CM | POA: Diagnosis not present

## 2023-03-25 DIAGNOSIS — E1122 Type 2 diabetes mellitus with diabetic chronic kidney disease: Secondary | ICD-10-CM | POA: Diagnosis not present

## 2023-03-25 DIAGNOSIS — N390 Urinary tract infection, site not specified: Secondary | ICD-10-CM | POA: Diagnosis not present

## 2023-03-25 DIAGNOSIS — I130 Hypertensive heart and chronic kidney disease with heart failure and stage 1 through stage 4 chronic kidney disease, or unspecified chronic kidney disease: Secondary | ICD-10-CM | POA: Diagnosis not present

## 2023-03-25 DIAGNOSIS — I051 Rheumatic mitral insufficiency: Secondary | ICD-10-CM | POA: Diagnosis not present

## 2023-03-25 DIAGNOSIS — D631 Anemia in chronic kidney disease: Secondary | ICD-10-CM | POA: Diagnosis not present

## 2023-03-25 DIAGNOSIS — N184 Chronic kidney disease, stage 4 (severe): Secondary | ICD-10-CM | POA: Diagnosis not present

## 2023-03-25 DIAGNOSIS — I5030 Unspecified diastolic (congestive) heart failure: Secondary | ICD-10-CM | POA: Diagnosis not present

## 2023-03-26 ENCOUNTER — Encounter (HOSPITAL_COMMUNITY): Payer: Self-pay

## 2023-03-26 ENCOUNTER — Ambulatory Visit (HOSPITAL_COMMUNITY)
Admission: RE | Admit: 2023-03-26 | Discharge: 2023-03-26 | Disposition: A | Discharge status: Home / Self Care | Payer: 59 | Source: Ambulatory Visit | Attending: Family Medicine | Admitting: Family Medicine

## 2023-03-26 VITALS — BP 178/94 | HR 61 | Ht 61.0 in

## 2023-03-26 DIAGNOSIS — Z66 Do not resuscitate: Secondary | ICD-10-CM | POA: Insufficient documentation

## 2023-03-26 DIAGNOSIS — M1990 Unspecified osteoarthritis, unspecified site: Secondary | ICD-10-CM | POA: Insufficient documentation

## 2023-03-26 DIAGNOSIS — Z993 Dependence on wheelchair: Secondary | ICD-10-CM | POA: Insufficient documentation

## 2023-03-26 DIAGNOSIS — I5032 Chronic diastolic (congestive) heart failure: Secondary | ICD-10-CM | POA: Insufficient documentation

## 2023-03-26 DIAGNOSIS — R5381 Other malaise: Secondary | ICD-10-CM

## 2023-03-26 DIAGNOSIS — I10 Essential (primary) hypertension: Secondary | ICD-10-CM | POA: Diagnosis not present

## 2023-03-26 DIAGNOSIS — N184 Chronic kidney disease, stage 4 (severe): Secondary | ICD-10-CM | POA: Diagnosis not present

## 2023-03-26 DIAGNOSIS — I2510 Atherosclerotic heart disease of native coronary artery without angina pectoris: Secondary | ICD-10-CM | POA: Insufficient documentation

## 2023-03-26 DIAGNOSIS — I350 Nonrheumatic aortic (valve) stenosis: Secondary | ICD-10-CM | POA: Insufficient documentation

## 2023-03-26 DIAGNOSIS — Z7189 Other specified counseling: Secondary | ICD-10-CM | POA: Diagnosis not present

## 2023-03-26 DIAGNOSIS — I440 Atrioventricular block, first degree: Secondary | ICD-10-CM | POA: Insufficient documentation

## 2023-03-26 DIAGNOSIS — I130 Hypertensive heart and chronic kidney disease with heart failure and stage 1 through stage 4 chronic kidney disease, or unspecified chronic kidney disease: Secondary | ICD-10-CM | POA: Insufficient documentation

## 2023-03-26 LAB — BASIC METABOLIC PANEL
Anion gap: 12 (ref 5–15)
BUN: 67 mg/dL — ABNORMAL HIGH (ref 8–23)
CO2: 23 mmol/L (ref 22–32)
Calcium: 8 mg/dL — ABNORMAL LOW (ref 8.9–10.3)
Chloride: 107 mmol/L (ref 98–111)
Creatinine, Ser: 3.19 mg/dL — ABNORMAL HIGH (ref 0.44–1.00)
GFR, Estimated: 15 mL/min — ABNORMAL LOW (ref >60)
Glucose, Bld: 85 mg/dL (ref 70–99)
Potassium: 4.4 mmol/L (ref 3.5–5.1)
Sodium: 142 mmol/L (ref 135–145)

## 2023-03-26 LAB — BRAIN NATRIURETIC PEPTIDE: B Natriuretic Peptide: 2975 pg/mL — ABNORMAL HIGH (ref 0.0–100.0)

## 2023-03-26 NOTE — Progress Notes (Signed)
 Advanced Heart Failure Clinic Note    PCP: Fernand Prost, MD Cardiology: Dr. Wonda HF Cardiology: Dr. Rolan  78 y.o. with history of aortic stenosis, chronic diastolic CHF, and CAD was referred by Dr. Wonda for evaluation of CHF.  Based on 12/21 echo and RHC/LHC in 12/21, the patient has moderate aortic stenosis. She had coronary angiography in 4/15 with rather diffuse moderate-severe coronary disease that was managed medically.  Cardiac MRI in 11/19 was concerning for cardiac amyloidosis. However, PYP scan was negative in 11/19.  Workup at that time also did not show a monoclonal protein to suggest AL amyloidosis.    Echo in 11/22 showed EF 60-65%, severe LVH, normal RV size and systolic function, mild MR, paradoxical low flow/low gradient severe aortic stenosis with mean gradient 24 mmHg and AVA 0.88 cm^2, small to moderate pericardial effusion.  PYP scan was repeated in 11/22, grade 1 with H/CL 1.07.  This is unlikely to be TTR cardiac amyloidosis.  Repeat urine immunofixation and myeloma panel were negative as well.  [employer] gene testing negative for hATTR mutation.   She was evaluated by hematology for pancytopenia.  This improved without intervention.   Follow up 07/26/21, BP markedly elevated and given 50 mg hydralazine  PO in clinic and hydralazine  increased to 50 mg tid.    Echo in 9/23 showed EF 60-65%, severe concentric LVH, normal RV, PASP 43, moderate pericardial effusion without tamponade, mild-moderate MR, moderate mitral stenosis with mean gradient 7 mmHg, severe low flow/low gradient AS mean gradient 30 with AVA 0.95 cm^2, IVC normal.   Follow up 10/23, NYHA II-III and mildly volume overloaded, Lasix  increased to 60 daily.   Admitted 7/24 with a/c HF. Echo showed AS severe. Structural Team consulted and felt not TAVR candidate. Diuresed with IV lasix  and GDMT titrated, limited by kidney function. She was seen by PMT and code updated to DNR, with plans for palliative care  outpatient.  She was discharged home, weight 171 lbs.  Admitted 1/25 with anemia, hgb 4.6 on admission. Received PRBCs. Cards consulted for peri-op risk stratification. She underwent EGD showing erythematous pyloric regions (biopsied), otherwise unremarkable. Colonoscopy showed no active sources of bleeding, polyps removed for biopsy. She was discharged home, weight 144 lbs  Today she returns for post hospital HF follow up with her husband. Overall feeling fine. Legs swell during the day, but improve by morning time. She is mostly WC bound due to severe arthritis, but no SOB with transfers out of WC. She is doing leg exercises with HH PT.  Denies syncope,  palpitations, abnormal bleeding, CP, dizziness, or PND/Orthopnea. Appetite ok. No fever or chills. Unable to stand for weight. Taking all medications, daughter helps with meds.  ECG (personally reviewed): SR 1AVB, PR 398 msec  Labs (6/23): LDL 119 Labs (9/23): K 4.1, creatinine 2.11 Labs (11/23): K 3.3, creatinine 2.29 Labs (7/24): K 3.5, creatinine 2.65 Labs (9/24): K 3.7, creatinine 3.49  Labs (10/24): K 3.7, creatinine 2.94 Labs (1/25): K 4.7, creatinine 3.09  PMH: 1. CKD stage 3 2. H/o seizure disorder 3. H/o TIA 4. H/o Mobitz type 1 2nd degree AVB 5. HTN 6. CAD: LHC (4/15) with 80% and 50% tandem stenoses in the proximal LAD, 80% mid LCx, 85% dLCx, diffuse mid-distal disease up to 90% in RCA.  Diffuse coronary disease, treated medically.  7. Aortic stenosis: Suspect moderate AS. - Echo (12/21): AoV mean gradient 32, AVA 1.2 cm^2.  - RHC/LHC (12/21): AoV mean gradient 22, AVA 1.47 cm^2.  -  Echo (11/22): EF 60-65%, severe LVH, normal RV size and systolic function, mild MR, paradoxical low flow/low gradient severe aortic stenosis with mean gradient 24 mmHg and AVA 0.88 cm^2, small to moderate pericardial effusion.  - Echo (7/24): severe AS, mean gradient worsened to 50 mmHg. 8. Chronic diastolic CHF:  - Cardiac MRI (88/80): EF 76%,  moderate LVH, difficult to null LV myocardium with diffuse subendocardial LGE concerning for possible cardiac amyloidosis, moderate pericardial effusion.  - PYP scan (11/19): grade 0, H/CL 1.08.  - Echo (12/21): EF 60-65%, moderate LVH, normal RV, moderate pericardial effusion, moderate AS with AoV mean gradient 32, AVA 1.2 cm^2.  - Echo (11/22): EF 60-65%, severe LVH, normal RV size and systolic function, mild MR, paradoxical low flow/low gradient severe aortic stenosis with mean gradient 24 mmHg and AVA 0.88 cm^2, small to moderate pericardial effusion.   - PYP scan (11/22): grade 1 with H/CL 1.07.  This is unlikely to be TTR cardiac amyloidosis. - [employer] gene testing negative for hATTR mutation.  - Echo (9/23): EF 60-65%, severe concentric LVH, normal RV, PASP 43, moderate pericardial effusion without tamponade, mild-moderate MR, moderate mitral stenosis with mean gradient 7 mmHg, severe low flow/low gradient AS mean gradient 30 with AVA 0.95 cm^2, IVC normal.  - Echo (7/24): EF 60-65%, severe LVH, normal RV, severe MAC, severe AS AVA 0.62 cm, AoV mean gradient 50 mmHg  9. Arthritis: Severe, limited to wheelchair.  10. Hyperlipidemia  Social History   Socioeconomic History   Marital status: Married    Spouse name: Johnny   Number of children: 2   Years of education: 4   Highest education level: Not on file  Occupational History   Occupation: retired  Tobacco Use   Smoking status: Former    Current packs/day: 0.00    Average packs/day: 0.5 packs/day for 10.0 years (5.0 ttl pk-yrs)    Types: Cigarettes    Start date: 06/04/1990    Quit date: 06/03/2000    Years since quitting: 22.8   Smokeless tobacco: Never  Vaping Use   Vaping status: Never Used  Substance and Sexual Activity   Alcohol use: No    Alcohol/week: 0.0 standard drinks of alcohol   Drug use: No   Sexual activity: Not Currently    Partners: Male  Other Topics Concern   Not on file  Social History Narrative    Current Social History 06/20/2020        Patient lives with spouse in a home which is 1 story. There are not steps up to the entrance, the patient uses a ramp.       Patient's method of transportation is [employer].      The highest level of education was some high school.      The patient currently disabled.      Identified important Relationships are God and my husband       Pets : None       Interests / Fun: Sitting on my front porch       Current Stressors: None        Son passed away.      Social Drivers of Corporate Investment Banker Strain: Low Risk  (09/18/2022)   Overall Financial Resource Strain (CARDIA)    Difficulty of Paying Living Expenses: Not hard at all  Food Insecurity: No Food Insecurity (03/05/2023)   Hunger Vital Sign    Worried About Running Out of Food in the Last Year: Never true  Ran Out of Food in the Last Year: Never true  Transportation Needs: No Transportation Needs (03/05/2023)   PRAPARE - Administrator, Civil Service (Medical): No    Lack of Transportation (Non-Medical): No  Physical Activity: Inactive (09/18/2022)   Exercise Vital Sign    Days of Exercise per Week: 0 days    Minutes of Exercise per Session: 0 min  Stress: No Stress Concern Present (09/18/2022)   [employer] of Occupational Health - Occupational Stress Questionnaire    Feeling of Stress : Not at all  Social Connections: Moderately Isolated (02/25/2023)   Social Connection and Isolation Panel [NHANES]    Frequency of Communication with Friends and Family: More than three times a week    Frequency of Social Gatherings with Friends and Family: Twice a week    Attends Religious Services: Never    Database Administrator or Organizations: No    Attends Banker Meetings: Never    Marital Status: Married  Catering Manager Violence: Not At Risk (03/05/2023)   Humiliation, Afraid, Rape, and Kick questionnaire    Fear of Current or Ex-Partner: No     Emotionally Abused: No    Physically Abused: No    Sexually Abused: No   Family History  Problem Relation Age of Onset   Hypertension Other    Heart disease Mother    Hypertension Mother    Cancer Father    Obesity Son    Asthma Sister    Heart disease Sister    Heart disease Sister    ROS: All systems reviewed and negative except as per HPI.   Current Outpatient Medications  Medication Sig Dispense Refill   acetaminophen  (TYLENOL ) 325 MG tablet Take 2 tablets (650 mg total) by mouth every 6 (six) hours as needed for mild pain (or Fever >/= 101). 100 tablet 0   albuterol  (PROVENTIL ) (2.5 MG/3ML) 0.083% nebulizer solution Take 3 mLs (2.5 mg total) by nebulization every 6 (six) hours as needed for wheezing. 90 mL 12   aspirin  81 MG chewable tablet Chew 1 tablet (81 mg total) by mouth daily. 90 tablet 0   calcium  carbonate (OSCAL) 1500 (600 Ca) MG TABS tablet Take 1 tablet (1,500 mg total) by mouth 3 (three) times daily with meals. 90 tablet 0   cholecalciferol  (VITAMIN D3) 25 MCG (1000 UNIT) tablet Take 1 tablet (1,000 Units total) by mouth daily. 30 tablet 11   febuxostat  (ULORIC ) 40 MG tablet Take 20 mg by mouth daily.     hydrALAZINE  (APRESOLINE ) 100 MG tablet TAKE 1 TABLET(100 MG) BY MOUTH THREE TIMES DAILY 90 tablet 11   JARDIANCE  10 MG TABS tablet TAKE 1 TABLET BY MOUTH DAILY  BEFORE BREAKFAST 90 tablet 3   lidocaine  (LMX) 4 % cream Apply 1 Application topically 2 (two) times daily as needed (Pain on sacral wound area). 30 g 0   multivitamin (RENA-VIT) TABS tablet Take 1 tablet by mouth at bedtime. 100 tablet 0   pantoprazole  (PROTONIX ) 40 MG tablet TAKE 1 TABLET BY MOUTH DAILY 100 tablet 2   PHENobarbital  (LUMINAL) 64.8 MG tablet Take 2 tablets (129.6 mg total) by mouth at bedtime. 200 tablet 3   potassium chloride  SA (KLOR-CON  M) 20 MEQ tablet Take 1 tablet (20 mEq total) by mouth daily. 90 tablet 3   rosuvastatin  (CRESTOR ) 40 MG tablet Take 1 tablet (40 mg total) by mouth  daily. 90 tablet 3   senna-docusate (SENOKOT S) 8.6-50 MG tablet Take  2 tablets by mouth daily. 180 tablet 0   sevelamer  carbonate (RENVELA ) 800 MG tablet Take 1 tablet (800 mg total) by mouth 3 (three) times daily with meals. 90 tablet 11   torsemide  (DEMADEX ) 20 MG tablet Take 3 tablets (60 mg total) by mouth daily. TAKE 2 TABLETS BY MOUTH DAILY AND AN EXTRA DOSE(2 TABLETS) IN THE PM FOR SWELLING OR SHORTNESS OF BREATH 270 tablet 6   zonisamide  (ZONEGRAN ) 100 MG capsule TAKE 2 CAPSULES BY MOUTH AT NIGHT 200 capsule 3   No current facility-administered medications for this encounter.   Wt Readings from Last 3 Encounters:  03/14/23 65.3 kg (144 lb)  02/28/23 65.3 kg (144 lb)  11/11/22 77.1 kg (170 lb)   BP (!) 178/94   Pulse 61   Ht 5' 1 (1.549 m)   SpO2 98%   BMI 27.21 kg/m   PHYSICAL EXAM: General:  NAD. No resp difficulty, arrived in [facility], elderly HEENT: Normal Neck: Supple. No JVD. Cor: Regular rate & rhythm. No rubs, gallops, 4/6 SEM RUSB Lungs: Clear Abdomen: Soft, nontender, nondistended.  Extremities: No cyanosis, clubbing, rash, 1+ BLE pre-tibial edema Neuro: Alert & oriented x 3, moves all 4 extremities w/o difficulty. Affect pleasant.  Assessment/Plan 1. Aortic stenosis: 12/21 LHC/RHC and echo were suggestive of moderate aortic stenosis.  Repeat echo in 11/22 showed paradoxical low flow/low gradient severe aortic stenosis, echo in 9/23 was similar.  Echo this admission (7/24) with severe AS, gradient higher.  She has been seen by Dr. Wonda in the past for TAVR evaluation.  She is very limited in terms of activity, basically wheel-chair bound at baseline. She has a long 1st degree AVB and has episodes of type 1 2nd degree AVB.  She also has CKD stage IV.  TAVR could be done, but high risk for progression to ESRD and need for pacemaker.  She would have a hard time with HD due to lack of mobility.  Evaluated by structural heart team, determined not a TAVR candidate.  - Dr.  Wonda still following.  - No change. 2. CAD: Moderate-severe diffuse CAD on coronary angiography in 4/15. She would not be a candidate for CABG due to lack of mobility. No chest pain. - Continue ASA 81 - Continue Crestor . 3. CKD stage IV/V: Baseline creatinine ~ 3.4. Followed by Dr. Gearline  - Continue SGLT2i. Labs reviewed from PCP visit on 03/20/23, K 4.7, SCr 3.09 4. Chronic diastolic CHF: Cardiac MRI was concerning for cardiac amyloidosis (11/19), as was echo with moderate LVH and aortic stenosis. The aortic stenosis and the pericardial effusion seen by echo are also frequently seen with cardiac amyloidosis. She additionally has tingling/numbness in hands/feet that is consistent with peripheral neuropathy.  However, PYP scan in 11/19 was negative and myeloma workup was negative.  Repeat PYP scan in 11/22 was again negative and she did not have a transthyretin gene mutation on [employer] gene testing.  Myeloma workup was again negative in 10/22.  Therefore, cardiac amyloidosis seems unlikely and LVH may be due to HTN and aortic stenosis though delayed enhancement from MRI is more suggestive of infiltrative disease. Most recent echo showed EF 60-65%, severe LVH, normal RV, small to moderate pericardial effusion (unchanged), severe AS with mean gradient 50 mmHg (AS has been severe in past but mean gradient has progressed). NYHA II-III, functional status confounded by being WC bound. She is not volume overloaded on exam, mild LEE - Continue torsemide  60 mg daily (OK to take extra 40 mg  PRN). Labs from PCP 03/20/23 reviewed and are stable, K 4.7, SCr 3.09 - Continue KCL 20 daily. - Continue Jardiance  10 mg daily. No GU symptoms. - Continue compression hose. - No ARB/ARNi or spiro with CKD. 5. HTN: BP elevated in clinic, but generally well-controlled. BP at her follow up with Dr. Wonda was 142/94. With CKD IV, will avoid lowering BP too much  - Continue hydralazine  100 mg tid. - Consider addition of Imdur if  BP remains elevated. 6. Conduction abnormality: Long 1st degree AVB, also runs of type 1 2nd degree AVB have been noted.   - Avoid beta blockade.  7. Debility: 2/2 severe arthritis. Minimally mobile. This is her baseline.  - She is unable to stand for weight today. No change. 8. GOC: Pt with worsening severe AS, conduction delay, diastolic CHF, known CAD medically managed and likely progressed, worsening CKD now stage IV/V, and minimal mobility. High risk of continued volume overload and ESRD without TAVR and high risk of ESRD and post TAVR heart block needing pacemaker with TAVR. I do not think she would do well on dialysis due to limited mobility.  - She is not ready for hospice, but she understands palliative management of her symptoms and preserving QOL is main goal. We discussed this again today. She seems to be feeling well post-hospitalization.  Follow up in 3-4 months with Dr. Rolan Harlene CHRISTELLA Glena, FNP-BC 03/26/23

## 2023-03-26 NOTE — Patient Instructions (Addendum)
 Thank you for coming in today  If you had labs drawn today, any labs that are abnormal the clinic will call you No news is good news  Medications: No changes   Follow up appointments:  Your physician recommends that you schedule a follow-up appointment in:  3-4 months With Dr. Earlean Shawl will receive a reminder letter in the mail a few months in advance. If you don't receive a letter, please call our office to schedule the follow-up appointment.    Do the following things EVERYDAY: Weigh yourself in the morning before breakfast. Write it down and keep it in a log. Take your medicines as prescribed Eat low salt foods--Limit salt (sodium) to 2000 mg per day.  Stay as active as you can everyday Limit all fluids for the day to less than 2 liters   At the [facility], you and your health needs are our priority. As part of our continuing mission to provide you with exceptional heart care, we have created designated Provider Care Teams. These Care Teams include your primary Cardiologist (physician) and Advanced Practice Providers (APPs- Physician Assistants and Nurse Practitioners) who all work together to provide you with the care you need, when you need it.   You may see any of the following providers on your designated Care Team at your next follow up: Dr Arvilla Meres Dr Marca Ancona Dr. Marcos Eke, NP Robbie Lis, [place] [facility] [place], [place] Brynda Peon, NP Karle Plumber, PharmD   Please be sure to bring in all your medications bottles to every appointment.    Thank you for choosing [facility]  If you have any questions or concerns before your next appointment please send [place] a message through [place] or call our office at [phone].    TO LEAVE A MESSAGE FOR THE NURSE SELECT OPTION 2, PLEASE LEAVE A MESSAGE INCLUDING: YOUR NAME DATE OF BIRTH CALL BACK NUMBER REASON FOR  CALL**this is important as we prioritize the call backs  YOU WILL RECEIVE A CALL BACK THE SAME DAY AS LONG AS YOU CALL BEFORE 4:00 PM

## 2023-03-28 DIAGNOSIS — K5900 Constipation, unspecified: Secondary | ICD-10-CM | POA: Diagnosis not present

## 2023-03-28 DIAGNOSIS — M47816 Spondylosis without myelopathy or radiculopathy, lumbar region: Secondary | ICD-10-CM | POA: Diagnosis not present

## 2023-03-28 DIAGNOSIS — N179 Acute kidney failure, unspecified: Secondary | ICD-10-CM | POA: Diagnosis not present

## 2023-03-28 DIAGNOSIS — E1122 Type 2 diabetes mellitus with diabetic chronic kidney disease: Secondary | ICD-10-CM | POA: Diagnosis not present

## 2023-03-28 DIAGNOSIS — I440 Atrioventricular block, first degree: Secondary | ICD-10-CM | POA: Diagnosis not present

## 2023-03-28 DIAGNOSIS — K644 Residual hemorrhoidal skin tags: Secondary | ICD-10-CM | POA: Diagnosis not present

## 2023-03-28 DIAGNOSIS — I2510 Atherosclerotic heart disease of native coronary artery without angina pectoris: Secondary | ICD-10-CM | POA: Diagnosis not present

## 2023-03-28 DIAGNOSIS — N2581 Secondary hyperparathyroidism of renal origin: Secondary | ICD-10-CM | POA: Diagnosis not present

## 2023-03-28 DIAGNOSIS — D62 Acute posthemorrhagic anemia: Secondary | ICD-10-CM | POA: Diagnosis not present

## 2023-03-28 DIAGNOSIS — M170 Bilateral primary osteoarthritis of knee: Secondary | ICD-10-CM | POA: Diagnosis not present

## 2023-03-28 DIAGNOSIS — N390 Urinary tract infection, site not specified: Secondary | ICD-10-CM | POA: Diagnosis not present

## 2023-03-28 DIAGNOSIS — I700 Atherosclerosis of aorta: Secondary | ICD-10-CM | POA: Diagnosis not present

## 2023-03-28 DIAGNOSIS — J45909 Unspecified asthma, uncomplicated: Secondary | ICD-10-CM | POA: Diagnosis not present

## 2023-03-28 DIAGNOSIS — H538 Other visual disturbances: Secondary | ICD-10-CM | POA: Diagnosis not present

## 2023-03-28 DIAGNOSIS — M810 Age-related osteoporosis without current pathological fracture: Secondary | ICD-10-CM | POA: Diagnosis not present

## 2023-03-28 DIAGNOSIS — I051 Rheumatic mitral insufficiency: Secondary | ICD-10-CM | POA: Diagnosis not present

## 2023-03-28 DIAGNOSIS — D631 Anemia in chronic kidney disease: Secondary | ICD-10-CM | POA: Diagnosis not present

## 2023-03-28 DIAGNOSIS — D61818 Other pancytopenia: Secondary | ICD-10-CM | POA: Diagnosis not present

## 2023-03-28 DIAGNOSIS — N184 Chronic kidney disease, stage 4 (severe): Secondary | ICD-10-CM | POA: Diagnosis not present

## 2023-03-28 DIAGNOSIS — I5030 Unspecified diastolic (congestive) heart failure: Secondary | ICD-10-CM | POA: Diagnosis not present

## 2023-03-28 DIAGNOSIS — K219 Gastro-esophageal reflux disease without esophagitis: Secondary | ICD-10-CM | POA: Diagnosis not present

## 2023-03-28 DIAGNOSIS — E785 Hyperlipidemia, unspecified: Secondary | ICD-10-CM | POA: Diagnosis not present

## 2023-03-28 DIAGNOSIS — I130 Hypertensive heart and chronic kidney disease with heart failure and stage 1 through stage 4 chronic kidney disease, or unspecified chronic kidney disease: Secondary | ICD-10-CM | POA: Diagnosis not present

## 2023-04-01 DIAGNOSIS — K5900 Constipation, unspecified: Secondary | ICD-10-CM | POA: Diagnosis not present

## 2023-04-01 DIAGNOSIS — I2510 Atherosclerotic heart disease of native coronary artery without angina pectoris: Secondary | ICD-10-CM | POA: Diagnosis not present

## 2023-04-01 DIAGNOSIS — E785 Hyperlipidemia, unspecified: Secondary | ICD-10-CM | POA: Diagnosis not present

## 2023-04-01 DIAGNOSIS — N390 Urinary tract infection, site not specified: Secondary | ICD-10-CM | POA: Diagnosis not present

## 2023-04-01 DIAGNOSIS — N184 Chronic kidney disease, stage 4 (severe): Secondary | ICD-10-CM | POA: Diagnosis not present

## 2023-04-01 DIAGNOSIS — I051 Rheumatic mitral insufficiency: Secondary | ICD-10-CM | POA: Diagnosis not present

## 2023-04-01 DIAGNOSIS — I5030 Unspecified diastolic (congestive) heart failure: Secondary | ICD-10-CM | POA: Diagnosis not present

## 2023-04-01 DIAGNOSIS — K644 Residual hemorrhoidal skin tags: Secondary | ICD-10-CM | POA: Diagnosis not present

## 2023-04-01 DIAGNOSIS — I700 Atherosclerosis of aorta: Secondary | ICD-10-CM | POA: Diagnosis not present

## 2023-04-01 DIAGNOSIS — D631 Anemia in chronic kidney disease: Secondary | ICD-10-CM | POA: Diagnosis not present

## 2023-04-01 DIAGNOSIS — E1122 Type 2 diabetes mellitus with diabetic chronic kidney disease: Secondary | ICD-10-CM | POA: Diagnosis not present

## 2023-04-01 DIAGNOSIS — N179 Acute kidney failure, unspecified: Secondary | ICD-10-CM | POA: Diagnosis not present

## 2023-04-01 DIAGNOSIS — D61818 Other pancytopenia: Secondary | ICD-10-CM | POA: Diagnosis not present

## 2023-04-01 DIAGNOSIS — D62 Acute posthemorrhagic anemia: Secondary | ICD-10-CM | POA: Diagnosis not present

## 2023-04-01 DIAGNOSIS — K219 Gastro-esophageal reflux disease without esophagitis: Secondary | ICD-10-CM | POA: Diagnosis not present

## 2023-04-01 DIAGNOSIS — M47816 Spondylosis without myelopathy or radiculopathy, lumbar region: Secondary | ICD-10-CM | POA: Diagnosis not present

## 2023-04-01 DIAGNOSIS — I130 Hypertensive heart and chronic kidney disease with heart failure and stage 1 through stage 4 chronic kidney disease, or unspecified chronic kidney disease: Secondary | ICD-10-CM | POA: Diagnosis not present

## 2023-04-01 DIAGNOSIS — J45909 Unspecified asthma, uncomplicated: Secondary | ICD-10-CM | POA: Diagnosis not present

## 2023-04-01 DIAGNOSIS — M810 Age-related osteoporosis without current pathological fracture: Secondary | ICD-10-CM | POA: Diagnosis not present

## 2023-04-01 DIAGNOSIS — H538 Other visual disturbances: Secondary | ICD-10-CM | POA: Diagnosis not present

## 2023-04-01 DIAGNOSIS — N2581 Secondary hyperparathyroidism of renal origin: Secondary | ICD-10-CM | POA: Diagnosis not present

## 2023-04-01 DIAGNOSIS — I440 Atrioventricular block, first degree: Secondary | ICD-10-CM | POA: Diagnosis not present

## 2023-04-01 DIAGNOSIS — M170 Bilateral primary osteoarthritis of knee: Secondary | ICD-10-CM | POA: Diagnosis not present

## 2023-04-02 DIAGNOSIS — I2510 Atherosclerotic heart disease of native coronary artery without angina pectoris: Secondary | ICD-10-CM | POA: Diagnosis not present

## 2023-04-02 DIAGNOSIS — I440 Atrioventricular block, first degree: Secondary | ICD-10-CM | POA: Diagnosis not present

## 2023-04-02 DIAGNOSIS — N184 Chronic kidney disease, stage 4 (severe): Secondary | ICD-10-CM | POA: Diagnosis not present

## 2023-04-02 DIAGNOSIS — K5900 Constipation, unspecified: Secondary | ICD-10-CM | POA: Diagnosis not present

## 2023-04-02 DIAGNOSIS — K644 Residual hemorrhoidal skin tags: Secondary | ICD-10-CM | POA: Diagnosis not present

## 2023-04-02 DIAGNOSIS — N2581 Secondary hyperparathyroidism of renal origin: Secondary | ICD-10-CM | POA: Diagnosis not present

## 2023-04-02 DIAGNOSIS — I051 Rheumatic mitral insufficiency: Secondary | ICD-10-CM | POA: Diagnosis not present

## 2023-04-02 DIAGNOSIS — J45909 Unspecified asthma, uncomplicated: Secondary | ICD-10-CM | POA: Diagnosis not present

## 2023-04-02 DIAGNOSIS — D61818 Other pancytopenia: Secondary | ICD-10-CM | POA: Diagnosis not present

## 2023-04-02 DIAGNOSIS — K219 Gastro-esophageal reflux disease without esophagitis: Secondary | ICD-10-CM | POA: Diagnosis not present

## 2023-04-02 DIAGNOSIS — I130 Hypertensive heart and chronic kidney disease with heart failure and stage 1 through stage 4 chronic kidney disease, or unspecified chronic kidney disease: Secondary | ICD-10-CM | POA: Diagnosis not present

## 2023-04-02 DIAGNOSIS — E1122 Type 2 diabetes mellitus with diabetic chronic kidney disease: Secondary | ICD-10-CM | POA: Diagnosis not present

## 2023-04-02 DIAGNOSIS — N179 Acute kidney failure, unspecified: Secondary | ICD-10-CM | POA: Diagnosis not present

## 2023-04-02 DIAGNOSIS — I5030 Unspecified diastolic (congestive) heart failure: Secondary | ICD-10-CM | POA: Diagnosis not present

## 2023-04-02 DIAGNOSIS — I700 Atherosclerosis of aorta: Secondary | ICD-10-CM | POA: Diagnosis not present

## 2023-04-02 DIAGNOSIS — E785 Hyperlipidemia, unspecified: Secondary | ICD-10-CM | POA: Diagnosis not present

## 2023-04-02 DIAGNOSIS — N390 Urinary tract infection, site not specified: Secondary | ICD-10-CM | POA: Diagnosis not present

## 2023-04-02 DIAGNOSIS — M47816 Spondylosis without myelopathy or radiculopathy, lumbar region: Secondary | ICD-10-CM | POA: Diagnosis not present

## 2023-04-02 DIAGNOSIS — D62 Acute posthemorrhagic anemia: Secondary | ICD-10-CM | POA: Diagnosis not present

## 2023-04-02 DIAGNOSIS — M170 Bilateral primary osteoarthritis of knee: Secondary | ICD-10-CM | POA: Diagnosis not present

## 2023-04-02 DIAGNOSIS — D631 Anemia in chronic kidney disease: Secondary | ICD-10-CM | POA: Diagnosis not present

## 2023-04-02 DIAGNOSIS — M810 Age-related osteoporosis without current pathological fracture: Secondary | ICD-10-CM | POA: Diagnosis not present

## 2023-04-02 DIAGNOSIS — H538 Other visual disturbances: Secondary | ICD-10-CM | POA: Diagnosis not present

## 2023-04-07 DIAGNOSIS — K644 Residual hemorrhoidal skin tags: Secondary | ICD-10-CM | POA: Diagnosis not present

## 2023-04-07 DIAGNOSIS — M47816 Spondylosis without myelopathy or radiculopathy, lumbar region: Secondary | ICD-10-CM | POA: Diagnosis not present

## 2023-04-07 DIAGNOSIS — M170 Bilateral primary osteoarthritis of knee: Secondary | ICD-10-CM | POA: Diagnosis not present

## 2023-04-07 DIAGNOSIS — N184 Chronic kidney disease, stage 4 (severe): Secondary | ICD-10-CM | POA: Diagnosis not present

## 2023-04-07 DIAGNOSIS — N390 Urinary tract infection, site not specified: Secondary | ICD-10-CM | POA: Diagnosis not present

## 2023-04-07 DIAGNOSIS — E1122 Type 2 diabetes mellitus with diabetic chronic kidney disease: Secondary | ICD-10-CM | POA: Diagnosis not present

## 2023-04-07 DIAGNOSIS — I5030 Unspecified diastolic (congestive) heart failure: Secondary | ICD-10-CM | POA: Diagnosis not present

## 2023-04-07 DIAGNOSIS — I051 Rheumatic mitral insufficiency: Secondary | ICD-10-CM | POA: Diagnosis not present

## 2023-04-07 DIAGNOSIS — M810 Age-related osteoporosis without current pathological fracture: Secondary | ICD-10-CM | POA: Diagnosis not present

## 2023-04-07 DIAGNOSIS — I130 Hypertensive heart and chronic kidney disease with heart failure and stage 1 through stage 4 chronic kidney disease, or unspecified chronic kidney disease: Secondary | ICD-10-CM | POA: Diagnosis not present

## 2023-04-07 DIAGNOSIS — D62 Acute posthemorrhagic anemia: Secondary | ICD-10-CM | POA: Diagnosis not present

## 2023-04-07 DIAGNOSIS — N179 Acute kidney failure, unspecified: Secondary | ICD-10-CM | POA: Diagnosis not present

## 2023-04-07 DIAGNOSIS — I700 Atherosclerosis of aorta: Secondary | ICD-10-CM | POA: Diagnosis not present

## 2023-04-07 DIAGNOSIS — J45909 Unspecified asthma, uncomplicated: Secondary | ICD-10-CM | POA: Diagnosis not present

## 2023-04-07 DIAGNOSIS — I440 Atrioventricular block, first degree: Secondary | ICD-10-CM | POA: Diagnosis not present

## 2023-04-07 DIAGNOSIS — H538 Other visual disturbances: Secondary | ICD-10-CM | POA: Diagnosis not present

## 2023-04-07 DIAGNOSIS — E785 Hyperlipidemia, unspecified: Secondary | ICD-10-CM | POA: Diagnosis not present

## 2023-04-07 DIAGNOSIS — D61818 Other pancytopenia: Secondary | ICD-10-CM | POA: Diagnosis not present

## 2023-04-07 DIAGNOSIS — I2510 Atherosclerotic heart disease of native coronary artery without angina pectoris: Secondary | ICD-10-CM | POA: Diagnosis not present

## 2023-04-07 DIAGNOSIS — D631 Anemia in chronic kidney disease: Secondary | ICD-10-CM | POA: Diagnosis not present

## 2023-04-07 DIAGNOSIS — K219 Gastro-esophageal reflux disease without esophagitis: Secondary | ICD-10-CM | POA: Diagnosis not present

## 2023-04-07 DIAGNOSIS — N2581 Secondary hyperparathyroidism of renal origin: Secondary | ICD-10-CM | POA: Diagnosis not present

## 2023-04-07 DIAGNOSIS — K5900 Constipation, unspecified: Secondary | ICD-10-CM | POA: Diagnosis not present

## 2023-04-08 ENCOUNTER — Telehealth: Payer: Self-pay | Admitting: *Deleted

## 2023-04-08 DIAGNOSIS — H538 Other visual disturbances: Secondary | ICD-10-CM | POA: Diagnosis not present

## 2023-04-08 DIAGNOSIS — I440 Atrioventricular block, first degree: Secondary | ICD-10-CM | POA: Diagnosis not present

## 2023-04-08 DIAGNOSIS — N2581 Secondary hyperparathyroidism of renal origin: Secondary | ICD-10-CM | POA: Diagnosis not present

## 2023-04-08 DIAGNOSIS — I130 Hypertensive heart and chronic kidney disease with heart failure and stage 1 through stage 4 chronic kidney disease, or unspecified chronic kidney disease: Secondary | ICD-10-CM | POA: Diagnosis not present

## 2023-04-08 DIAGNOSIS — N184 Chronic kidney disease, stage 4 (severe): Secondary | ICD-10-CM

## 2023-04-08 DIAGNOSIS — I700 Atherosclerosis of aorta: Secondary | ICD-10-CM | POA: Diagnosis not present

## 2023-04-08 DIAGNOSIS — K219 Gastro-esophageal reflux disease without esophagitis: Secondary | ICD-10-CM | POA: Diagnosis not present

## 2023-04-08 DIAGNOSIS — Z7409 Other reduced mobility: Secondary | ICD-10-CM

## 2023-04-08 DIAGNOSIS — E785 Hyperlipidemia, unspecified: Secondary | ICD-10-CM | POA: Diagnosis not present

## 2023-04-08 DIAGNOSIS — M170 Bilateral primary osteoarthritis of knee: Secondary | ICD-10-CM | POA: Diagnosis not present

## 2023-04-08 DIAGNOSIS — D61818 Other pancytopenia: Secondary | ICD-10-CM | POA: Diagnosis not present

## 2023-04-08 DIAGNOSIS — M810 Age-related osteoporosis without current pathological fracture: Secondary | ICD-10-CM | POA: Diagnosis not present

## 2023-04-08 DIAGNOSIS — K5900 Constipation, unspecified: Secondary | ICD-10-CM | POA: Diagnosis not present

## 2023-04-08 DIAGNOSIS — J45909 Unspecified asthma, uncomplicated: Secondary | ICD-10-CM | POA: Diagnosis not present

## 2023-04-08 DIAGNOSIS — M47816 Spondylosis without myelopathy or radiculopathy, lumbar region: Secondary | ICD-10-CM | POA: Diagnosis not present

## 2023-04-08 DIAGNOSIS — D62 Acute posthemorrhagic anemia: Secondary | ICD-10-CM | POA: Diagnosis not present

## 2023-04-08 DIAGNOSIS — E1122 Type 2 diabetes mellitus with diabetic chronic kidney disease: Secondary | ICD-10-CM | POA: Diagnosis not present

## 2023-04-08 DIAGNOSIS — I2510 Atherosclerotic heart disease of native coronary artery without angina pectoris: Secondary | ICD-10-CM | POA: Diagnosis not present

## 2023-04-08 DIAGNOSIS — D631 Anemia in chronic kidney disease: Secondary | ICD-10-CM | POA: Diagnosis not present

## 2023-04-08 DIAGNOSIS — K644 Residual hemorrhoidal skin tags: Secondary | ICD-10-CM | POA: Diagnosis not present

## 2023-04-08 DIAGNOSIS — I5030 Unspecified diastolic (congestive) heart failure: Secondary | ICD-10-CM | POA: Diagnosis not present

## 2023-04-08 DIAGNOSIS — N390 Urinary tract infection, site not specified: Secondary | ICD-10-CM | POA: Diagnosis not present

## 2023-04-08 DIAGNOSIS — N179 Acute kidney failure, unspecified: Secondary | ICD-10-CM | POA: Diagnosis not present

## 2023-04-08 DIAGNOSIS — I051 Rheumatic mitral insufficiency: Secondary | ICD-10-CM | POA: Diagnosis not present

## 2023-04-08 NOTE — Telephone Encounter (Addendum)
Call from [facility] OT with [facility] following up on request for 3 in 1 commode and manuel w/c. A DME order has been ordered for the commode - asking Chilon B to f/u. I do not see an order for the w/c; OT is requesting [facility] wheelchair with leg rest; stated her electric w/c does not have one. Rosanne Ashing OT is requesting a call back when equipments have been delivered. Thanks

## 2023-04-09 NOTE — Telephone Encounter (Signed)
Sherry [name] was informed of DME order.

## 2023-04-11 DIAGNOSIS — J45909 Unspecified asthma, uncomplicated: Secondary | ICD-10-CM | POA: Diagnosis not present

## 2023-04-13 DIAGNOSIS — M7989 Other specified soft tissue disorders: Secondary | ICD-10-CM | POA: Diagnosis not present

## 2023-04-13 DIAGNOSIS — I5031 Acute diastolic (congestive) heart failure: Secondary | ICD-10-CM | POA: Diagnosis not present

## 2023-04-14 ENCOUNTER — Other Ambulatory Visit: Payer: Self-pay

## 2023-04-14 DIAGNOSIS — N184 Chronic kidney disease, stage 4 (severe): Secondary | ICD-10-CM

## 2023-04-14 MED ORDER — SEVELAMER CARBONATE 800 MG PO TABS
800.0000 mg | ORAL_TABLET | Freq: Three times a day (TID) | ORAL | 11 refills | Status: DC
Start: 2023-04-14 — End: 2023-10-05

## 2023-04-16 DIAGNOSIS — H538 Other visual disturbances: Secondary | ICD-10-CM | POA: Diagnosis not present

## 2023-04-16 DIAGNOSIS — J45909 Unspecified asthma, uncomplicated: Secondary | ICD-10-CM | POA: Diagnosis not present

## 2023-04-16 DIAGNOSIS — I2510 Atherosclerotic heart disease of native coronary artery without angina pectoris: Secondary | ICD-10-CM | POA: Diagnosis not present

## 2023-04-16 DIAGNOSIS — I700 Atherosclerosis of aorta: Secondary | ICD-10-CM | POA: Diagnosis not present

## 2023-04-16 DIAGNOSIS — M170 Bilateral primary osteoarthritis of knee: Secondary | ICD-10-CM | POA: Diagnosis not present

## 2023-04-16 DIAGNOSIS — I051 Rheumatic mitral insufficiency: Secondary | ICD-10-CM | POA: Diagnosis not present

## 2023-04-16 DIAGNOSIS — I440 Atrioventricular block, first degree: Secondary | ICD-10-CM | POA: Diagnosis not present

## 2023-04-16 DIAGNOSIS — E785 Hyperlipidemia, unspecified: Secondary | ICD-10-CM | POA: Diagnosis not present

## 2023-04-16 DIAGNOSIS — M810 Age-related osteoporosis without current pathological fracture: Secondary | ICD-10-CM | POA: Diagnosis not present

## 2023-04-16 DIAGNOSIS — N390 Urinary tract infection, site not specified: Secondary | ICD-10-CM | POA: Diagnosis not present

## 2023-04-16 DIAGNOSIS — D631 Anemia in chronic kidney disease: Secondary | ICD-10-CM | POA: Diagnosis not present

## 2023-04-16 DIAGNOSIS — K644 Residual hemorrhoidal skin tags: Secondary | ICD-10-CM | POA: Diagnosis not present

## 2023-04-16 DIAGNOSIS — D62 Acute posthemorrhagic anemia: Secondary | ICD-10-CM | POA: Diagnosis not present

## 2023-04-16 DIAGNOSIS — D61818 Other pancytopenia: Secondary | ICD-10-CM | POA: Diagnosis not present

## 2023-04-16 DIAGNOSIS — M47816 Spondylosis without myelopathy or radiculopathy, lumbar region: Secondary | ICD-10-CM | POA: Diagnosis not present

## 2023-04-16 DIAGNOSIS — K5900 Constipation, unspecified: Secondary | ICD-10-CM | POA: Diagnosis not present

## 2023-04-16 DIAGNOSIS — N2581 Secondary hyperparathyroidism of renal origin: Secondary | ICD-10-CM | POA: Diagnosis not present

## 2023-04-16 DIAGNOSIS — N179 Acute kidney failure, unspecified: Secondary | ICD-10-CM | POA: Diagnosis not present

## 2023-04-16 DIAGNOSIS — I130 Hypertensive heart and chronic kidney disease with heart failure and stage 1 through stage 4 chronic kidney disease, or unspecified chronic kidney disease: Secondary | ICD-10-CM | POA: Diagnosis not present

## 2023-04-16 DIAGNOSIS — N184 Chronic kidney disease, stage 4 (severe): Secondary | ICD-10-CM | POA: Diagnosis not present

## 2023-04-16 DIAGNOSIS — K219 Gastro-esophageal reflux disease without esophagitis: Secondary | ICD-10-CM | POA: Diagnosis not present

## 2023-04-16 DIAGNOSIS — I5030 Unspecified diastolic (congestive) heart failure: Secondary | ICD-10-CM | POA: Diagnosis not present

## 2023-04-16 DIAGNOSIS — E1122 Type 2 diabetes mellitus with diabetic chronic kidney disease: Secondary | ICD-10-CM | POA: Diagnosis not present

## 2023-04-17 DIAGNOSIS — M3500 Sicca syndrome, unspecified: Secondary | ICD-10-CM | POA: Diagnosis not present

## 2023-04-17 DIAGNOSIS — N2581 Secondary hyperparathyroidism of renal origin: Secondary | ICD-10-CM | POA: Diagnosis not present

## 2023-04-17 DIAGNOSIS — I509 Heart failure, unspecified: Secondary | ICD-10-CM | POA: Diagnosis not present

## 2023-04-17 DIAGNOSIS — D631 Anemia in chronic kidney disease: Secondary | ICD-10-CM | POA: Diagnosis not present

## 2023-04-17 DIAGNOSIS — G459 Transient cerebral ischemic attack, unspecified: Secondary | ICD-10-CM | POA: Diagnosis not present

## 2023-04-17 DIAGNOSIS — N189 Chronic kidney disease, unspecified: Secondary | ICD-10-CM | POA: Diagnosis not present

## 2023-04-17 DIAGNOSIS — N184 Chronic kidney disease, stage 4 (severe): Secondary | ICD-10-CM | POA: Diagnosis not present

## 2023-04-17 DIAGNOSIS — I350 Nonrheumatic aortic (valve) stenosis: Secondary | ICD-10-CM | POA: Diagnosis not present

## 2023-04-23 DIAGNOSIS — I051 Rheumatic mitral insufficiency: Secondary | ICD-10-CM | POA: Diagnosis not present

## 2023-04-23 DIAGNOSIS — J45909 Unspecified asthma, uncomplicated: Secondary | ICD-10-CM | POA: Diagnosis not present

## 2023-04-23 DIAGNOSIS — D61818 Other pancytopenia: Secondary | ICD-10-CM | POA: Diagnosis not present

## 2023-04-23 DIAGNOSIS — I2510 Atherosclerotic heart disease of native coronary artery without angina pectoris: Secondary | ICD-10-CM | POA: Diagnosis not present

## 2023-04-23 DIAGNOSIS — M47816 Spondylosis without myelopathy or radiculopathy, lumbar region: Secondary | ICD-10-CM | POA: Diagnosis not present

## 2023-04-23 DIAGNOSIS — I5030 Unspecified diastolic (congestive) heart failure: Secondary | ICD-10-CM | POA: Diagnosis not present

## 2023-04-23 DIAGNOSIS — I700 Atherosclerosis of aorta: Secondary | ICD-10-CM | POA: Diagnosis not present

## 2023-04-23 DIAGNOSIS — D631 Anemia in chronic kidney disease: Secondary | ICD-10-CM | POA: Diagnosis not present

## 2023-04-23 DIAGNOSIS — M810 Age-related osteoporosis without current pathological fracture: Secondary | ICD-10-CM | POA: Diagnosis not present

## 2023-04-23 DIAGNOSIS — K644 Residual hemorrhoidal skin tags: Secondary | ICD-10-CM | POA: Diagnosis not present

## 2023-04-23 DIAGNOSIS — M170 Bilateral primary osteoarthritis of knee: Secondary | ICD-10-CM | POA: Diagnosis not present

## 2023-04-23 DIAGNOSIS — N2581 Secondary hyperparathyroidism of renal origin: Secondary | ICD-10-CM | POA: Diagnosis not present

## 2023-04-23 DIAGNOSIS — H538 Other visual disturbances: Secondary | ICD-10-CM | POA: Diagnosis not present

## 2023-04-23 DIAGNOSIS — K5900 Constipation, unspecified: Secondary | ICD-10-CM | POA: Diagnosis not present

## 2023-04-23 DIAGNOSIS — D62 Acute posthemorrhagic anemia: Secondary | ICD-10-CM | POA: Diagnosis not present

## 2023-04-23 DIAGNOSIS — N184 Chronic kidney disease, stage 4 (severe): Secondary | ICD-10-CM | POA: Diagnosis not present

## 2023-04-23 DIAGNOSIS — K219 Gastro-esophageal reflux disease without esophagitis: Secondary | ICD-10-CM | POA: Diagnosis not present

## 2023-04-23 DIAGNOSIS — E1122 Type 2 diabetes mellitus with diabetic chronic kidney disease: Secondary | ICD-10-CM | POA: Diagnosis not present

## 2023-04-23 DIAGNOSIS — N179 Acute kidney failure, unspecified: Secondary | ICD-10-CM | POA: Diagnosis not present

## 2023-04-23 DIAGNOSIS — N390 Urinary tract infection, site not specified: Secondary | ICD-10-CM | POA: Diagnosis not present

## 2023-04-23 DIAGNOSIS — E785 Hyperlipidemia, unspecified: Secondary | ICD-10-CM | POA: Diagnosis not present

## 2023-04-23 DIAGNOSIS — I130 Hypertensive heart and chronic kidney disease with heart failure and stage 1 through stage 4 chronic kidney disease, or unspecified chronic kidney disease: Secondary | ICD-10-CM | POA: Diagnosis not present

## 2023-04-23 DIAGNOSIS — I440 Atrioventricular block, first degree: Secondary | ICD-10-CM | POA: Diagnosis not present

## 2023-04-24 ENCOUNTER — Ambulatory Visit: Payer: 59 | Admitting: Neurology

## 2023-04-24 ENCOUNTER — Encounter: Payer: Self-pay | Admitting: Neurology

## 2023-04-24 VITALS — BP 188/78 | HR 65 | Wt 142.6 lb

## 2023-04-24 DIAGNOSIS — G40209 Localization-related (focal) (partial) symptomatic epilepsy and epileptic syndromes with complex partial seizures, not intractable, without status epilepticus: Secondary | ICD-10-CM

## 2023-04-24 MED ORDER — PHENOBARBITAL 64.8 MG PO TABS: 129.6000 mg | ORAL_TABLET | Freq: Every day | ORAL | 3 refills | Status: DC

## 2023-04-24 MED ORDER — ZONISAMIDE 100 MG PO CAPS: ORAL_CAPSULE | ORAL | 3 refills | Status: DC

## 2023-04-24 NOTE — Patient Instructions (Signed)
 Good to see you.  Starting tonight, reduce Zonisamide 100mg : take 1 capsule every night  2. Continue Phenobarbtial 64.8mg : take 2 capsules every night  3. Continue physical therapy  4. Follow-up in 6 months, call for any changes   Seizure Precautions: 1. If medication has been prescribed for you to prevent seizures, take it exactly as directed.  Do not stop taking the medicine without talking to your doctor first, even if you have not had a seizure in a long time.   2. Avoid activities in which a seizure would cause danger to yourself or to others.  Don't operate dangerous machinery, swim alone, or climb in high or dangerous places, such as on ladders, roofs, or girders.  Do not drive unless your doctor says you may.  3. If you have any warning that you may have a seizure, lay down in a safe place where you can't hurt yourself.    4.  No driving for 6 months from last seizure, as per [facility].   Please refer to the following link on the Epilepsy Foundation of America's website for more information: http://www.epilepsyfoundation.org/answerplace/Social/driving/drivingu.cfm   5.  Maintain good sleep hygiene.   6.  Contact your doctor if you have any problems that may be related to the medicine you are taking.  7.  Call 911 and bring the patient back to the ED if:        A.  The seizure lasts longer than 5 minutes.       B.  The patient doesn't awaken shortly after the seizure  C.  The patient has new problems such as difficulty seeing, speaking or moving  D.  The patient was injured during the seizure  E.  The patient has a temperature over 102 F (39C)  F.  The patient vomited and now is having trouble breathing

## 2023-04-24 NOTE — Progress Notes (Signed)
 NEUROLOGY FOLLOW UP OFFICE NOTE  Sherry [name] [id number] May 04, 1945  HISTORY OF PRESENT ILLNESS: I had the pleasure of seeing Sherry [name] in follow-up in the neurology clinic on 04/24/2023.  The patient was last seen 7 months ago for right temporal lobe epilepsy. She is again accompanied by her husband who helps supplement the history today.  Records and images were personally reviewed where available.  Since her last visit, they deny any seizures or seizure-like symptoms. NO convulsions since age 49, no focal seizures since 2018. She was admitted for symptomatic anemia in January. She reports overall doing fine. She denies any headaches, dizziness, focal numbness/tingling. BP today elevated, she has not taken morning medications yet. She has chronic left foot drop and has been doing PT recently. No falls. On review of records, her GFR has gone down since last visit, ranging from 13-16 (previously 16-22). She is on Zonisamide 200mg  at bedtime and Phenobarbtial 64.8mg : 2 tabs at bedtime without side effects.   History on Initial Assessment 05/20/2016: This is a pleasant 78 yo RH woman with a history of hypertension, hyperlipidemia, AV block, CAD, and seizures. Records from [facility] were reviewed. She started having seizures in the 60s (age 48s), initially she was having generalized convulsions often preceded by a sensation of severe nausea, "sick to stomach," followed by eyes rolling up, shaking of both arms and legs with tongue bite. They were initially occurring every 2 weeks while on Dilantin, none since adding Phenobarbital. They report the last GTC was probably in her 55s (>10 years ago). Family would also note episodes of unresponsiveness where her head drops forward slightly and she does not respond. She would be amnestic of these and feel like she woke up from sleep. Her daughter previously reported she seemed confused afterwards. There is note that these resolved after phenobarbital  reduction in 2013, however her husband reports occasional episodes where he would be talking to her, then he comes back in the room and she would look at him like they did not talk earlier, she would not understand and say "huh?" and he asks if she heard him. He last noticed this around 1.5 months ago, prior to that was 7-8 months ago. She continues to have the sensation of a sickening feeling every few months or so, these always seem to occur when she is in the bath. She had one 2 weeks ago, these last up to a minute, she denies any confusion or focal symptoms. She denies any olfactory/gustatory hallucinations, deja vu, focal numbness/tingling/weakness, myoclonic jerks. She notices her hand twitches sometimes. Her husband denies any nocturnal seizures.   She is currently taking Phenobarbital 64.8mg  2 tablets daily and Zonisamide 300mg  qhs. She was previously drowsy on higher doses of Phenobarbital, and with reduction of dose, she currently denies any side effects to her medications. She denies any headaches, dizziness, diplopia, dysarthria/dysphagia, neck/back pain, bladder dysfunction. She has constipation. She needs a walker to ambulate and denies any falls. She denies any focal symptoms but reports tingling in both hands and weakness in both legs, no pain. She reports sleep is good, and reports good compliance to medications. She reports a history of migraines, but these have resolved.   Epilepsy Risk Factors:  A maternal cousin had seizures when younger. Otherwise she had a normal birth and early development.  There is no history of febrile convulsions, CNS infections such as meningitis/encephalitis, significant traumatic brain injury, neurosurgical procedures.   Prior AEDs: Dilantin (ineffective) Laboratory Data: Phenobarbital  level 03/13/16 - 28. BMP showed a creatinine of 1.32, BUN 28; vitamin D level in 09/2015 was 25.9 EEG per [facility] notes: mild diffuse slowing on routine EEG MRI per [facility] notes:  showed evidence of old strokes 1-hour EEG: Occasional right temporal slowing, frequent right frontotemporal epileptiform discharges EMG/ NCV of the left leg 11/2021: suggestive of a chronic sensorimotor axonal polyneuropathy. Superimposed multilevel lumbosacral radiculopathies affecting the L3-S1 nerve root/segment may also be possible. She has declined lumbar MRI stating surgery had been offered but she is not interested.    PAST MEDICAL HISTORY: Past Medical History:  Diagnosis Date   AKI (acute kidney injury) (HCC) 01/31/2020   Anemia    Aortic stenosis    Arthritis    Bilateral lower extremity edema    Bilateral lower extremity edema    Blurry vision, bilateral 05/08/2016   Bradycardia    a. 01/2013 - asymptomatic.   Chronic diastolic heart failure (HCC)    Chronic sinus bradycardia 07/02/2012   CKD (chronic kidney disease)    a. baseline CKD stage III   Coronary artery disease    Electrolyte abnormality 08/30/2022   Epilepsy (HCC) 06/04/2006   EEG 02/19/12 " Interpretation:  This is an abnormal EEG demonstrating continuous and generalized slowing of the background in the theta frequency. Irregular rhythm of EKG noted.  Clinical correlation: the generalized slowing is consistent with a mild encephalopathy of nonspecific etiologies for which the differential would include infectious, toxic, metabolic, inflammatory, and vascular etiologies.    GERD (gastroesophageal reflux disease)    Headache(784.0)    Heart block AV second degree 02/15/2013   Heart murmur    Hyperlipidemia    Hypertension    Hypocalcemia 08/27/2022   Patient with chronic hypocalcemia, found to have a critical calcium of 5.8 on CMP today.  Albumin 2.7, corrected calcium 6.8.  Likely multifactorial due to CKD, vitamin D deficiency.  This was discussed extensively with the patient and family.  Risks and benefits of inpatient management discussed and family decided to continue with current plan of outpatient management  with close follow-up.  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 -Orbits: Grossly unremarkable. -Paranasal sinuses: Imaged portions clear. -Brain: No acute abnormalities such as hemorrhage, hydrocephalus, acute ischemia, or evidence of a mass lesion.  There is patchy bilateral cerebral white matter T2 and flair signal hyper intensities.  Centimeter or smaller CSF intensity   Mobitz (type) I (Wenckebach's) atrioventricular block    a. 01/2013 - asymptomatic.   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   TIA (transient ischemic attack) 2014   pt stated she had "mini strokes"   UTI (urinary tract infection) 03/07/2022   Vitamin D deficiency 04/22/2014    MEDICATIONS: Current Outpatient Medications on File Prior to Visit  Medication Sig Dispense Refill   acetaminophen (TYLENOL) 325 MG tablet Take 2 tablets (650 mg total) by mouth every 6 (six) hours as needed for mild pain (or Fever >/= 101). 100 tablet 0   albuterol (  PROVENTIL) (2.5 MG/3ML) 0.083% nebulizer solution Take 3 mLs (2.5 mg total) by nebulization every 6 (six) hours as needed for wheezing. 90 mL 12   aspirin 81 MG chewable tablet Chew 1 tablet (81 mg total) by mouth daily. 90 tablet 0   calcium carbonate (OSCAL) 1500 (600 Ca) MG TABS tablet Take 1 tablet (1,500 mg total) by mouth 3 (three) times daily with meals. 90 tablet 0   cholecalciferol (VITAMIN D3) 25 MCG (1000 UNIT) tablet Take 1 tablet (1,000 Units total) by mouth daily. 30  tablet 11   febuxostat (ULORIC) 40 MG tablet Take 20 mg by mouth daily.     hydrALAZINE (APRESOLINE) 100 MG tablet TAKE 1 TABLET(100 MG) BY MOUTH THREE TIMES DAILY 90 tablet 11   JARDIANCE 10 MG TABS tablet TAKE 1 TABLET BY MOUTH DAILY  BEFORE BREAKFAST 90 tablet 3   lidocaine (LMX) 4 % cream Apply 1 Application topically 2 (two) times daily as needed (Pain on sacral wound area). 30 g 0   multivitamin (RENA-VIT) TABS tablet Take 1 tablet by mouth at bedtime. 100 tablet 0   pantoprazole (PROTONIX) 40 MG tablet TAKE 1 TABLET BY MOUTH DAILY 100 tablet 2   PHENobarbital (LUMINAL) 64.8 MG tablet Take 2 tablets (129.6 mg total) by mouth at bedtime. 200 tablet 3   potassium chloride SA (KLOR-CON M) 20 MEQ tablet Take 1 tablet (20 mEq total) by mouth daily. 90 tablet 3   rosuvastatin (CRESTOR) 40 MG tablet Take 1 tablet (40 mg total) by mouth daily. 90 tablet 3   senna-docusate (SENOKOT S) 8.6-50 MG tablet Take 2 tablets by mouth daily. 180 tablet 0   sevelamer carbonate (RENVELA) 800 MG tablet Take 1 tablet (800 mg total) by mouth 3 (three) times daily with meals. 90 tablet 11   torsemide (DEMADEX) 20 MG tablet Take 3 tablets (60 mg total) by mouth daily. TAKE 2 TABLETS BY MOUTH DAILY AND AN EXTRA DOSE(2 TABLETS) IN THE PM FOR SWELLING OR SHORTNESS OF BREATH 270 tablet 6   zonisamide (ZONEGRAN) 100 MG capsule TAKE 2 CAPSULES BY MOUTH AT NIGHT 200 capsule 3   No current facility-administered medications on file prior to visit.    ALLERGIES: Allergies  Allergen Reactions   Ibuprofen Other (See Comments)    Dr advised pt not to take ibuprofen   Lactose Intolerance (Gi) Diarrhea    FAMILY HISTORY: Family History  Problem Relation Age of Onset   Hypertension Other    Heart disease Mother    Hypertension Mother    Cancer Father    Obesity Son    Asthma Sister    Heart disease Sister    Heart disease Sister     SOCIAL HISTORY: Social History   Socioeconomic History   Marital status:  Married    Spouse name: Johnny   Number of children: 2   Years of education: 4   Highest education level: Not on file  Occupational History   Occupation: retired  Tobacco Use   Smoking status: Former    Current packs/day: 0.00    Average packs/day: 0.5 packs/day for 10.0 years (5.0 ttl pk-yrs)    Types: Cigarettes    Start date: 06/04/1990    Quit date: 06/03/2000    Years since quitting: 22.9   Smokeless tobacco: Never  Vaping Use   Vaping status: Never Used  Substance and Sexual Activity   Alcohol use: No    Alcohol/week: 0.0 standard drinks of alcohol   Drug use: No  Sexual activity: Not Currently    Partners: Male  Other Topics Concern   Not on file  Social History Narrative   Current Social History 06/20/2020        Patient lives with spouse in a home which is 1 story. There are not steps up to the entrance, the patient uses a ramp.       Patient's method of transportation is [employer].      The highest level of education was some high school.      The patient currently disabled.      Identified important Relationships are "God and my husband"       Pets : None       Interests / Fun: "Sitting on my front porch"       Current Stressors: None        Son passed away.      Social Drivers of Corporate investment banker Strain: Low Risk  (09/18/2022)   Overall Financial Resource Strain (CARDIA)    Difficulty of Paying Living Expenses: Not hard at all  Food Insecurity: No Food Insecurity (03/05/2023)   Hunger Vital Sign    Worried About Running Out of Food in the Last Year: Never true    Ran Out of Food in the Last Year: Never true  Transportation Needs: No Transportation Needs (03/05/2023)   PRAPARE - Administrator, Civil Service (Medical): No    Lack of Transportation (Non-Medical): No  Physical Activity: Inactive (09/18/2022)   Exercise Vital Sign    Days of Exercise per Week: 0 days    Minutes of Exercise per Session: 0 min  Stress: No Stress Concern  Present (09/18/2022)   [employer] of Occupational Health - Occupational Stress Questionnaire    Feeling of Stress : Not at all  Social Connections: Moderately Isolated (02/25/2023)   Social Connection and Isolation Panel [NHANES]    Frequency of Communication with Friends and Family: More than three times a week    Frequency of Social Gatherings with Friends and Family: Twice a week    Attends Religious Services: Never    Database administrator or Organizations: No    Attends Banker Meetings: Never    Marital Status: Married  Catering manager Violence: Not At Risk (03/05/2023)   Humiliation, Afraid, Rape, and Kick questionnaire    Fear of Current or Ex-Partner: No    Emotionally Abused: No    Physically Abused: No    Sexually Abused: No     PHYSICAL EXAM: Vitals:   04/24/23 0935 04/24/23 1011  BP: (!) 188/80 (!) 188/78  Pulse: 65   SpO2: 99%    General: No acute distress, sitting on wheelchair Head:  Normocephalic/atraumatic Skin/Extremities: No rash, no edema Neurological Exam: alert and awake. No aphasia or dysarthria. Fund of knowledge is appropriate. Attention and concentration are normal.   Cranial nerves: Pupils equal, round. Extraocular movements intact with no nystagmus. Visual fields full.  No facial asymmetry.  Motor: Bulk and tone normal, muscle strength 5/5 on both UE and both proximal LE, 3/5 right foot dorsiflexion, 2/5 left foot eversion and dorsiflexion with left foot internally rotated. Gait not tested   IMPRESSION: This is a pleasant 78 yo RH woman with a history of  hypertension, hyperlipidemia, AV block, CAD, and right temporal lobe epilepsy. No convulsions since age 20, no focal seizures since 2018. She has had continued worsening kidney function since Zonisamide was started in 2015, with further  worsening of GFR, we will reduce Zonisamide to 100mg  at bedtime. Continue Phenobarbital 64.8mg  2 tabs at bedtime. We discussed risks for breakthrough  seizure with any medication adjustment, they know to call for any changes. Continue PT. BP today elevated, she has not taken morning medications, advised to monitor at home and discuss with PCP if still elevated. She does not drive. Follow-up in 6 months, call for any changes.    Thank you for allowing me to participate in her care.  Please do not hesitate to call for any questions or concerns.    Patrcia Dolly, M.D.   CC: Dr. Welton Flakes

## 2023-04-30 DIAGNOSIS — M47816 Spondylosis without myelopathy or radiculopathy, lumbar region: Secondary | ICD-10-CM | POA: Diagnosis not present

## 2023-04-30 DIAGNOSIS — I440 Atrioventricular block, first degree: Secondary | ICD-10-CM | POA: Diagnosis not present

## 2023-04-30 DIAGNOSIS — K5900 Constipation, unspecified: Secondary | ICD-10-CM | POA: Diagnosis not present

## 2023-04-30 DIAGNOSIS — I2510 Atherosclerotic heart disease of native coronary artery without angina pectoris: Secondary | ICD-10-CM | POA: Diagnosis not present

## 2023-04-30 DIAGNOSIS — I5030 Unspecified diastolic (congestive) heart failure: Secondary | ICD-10-CM | POA: Diagnosis not present

## 2023-04-30 DIAGNOSIS — N184 Chronic kidney disease, stage 4 (severe): Secondary | ICD-10-CM | POA: Diagnosis not present

## 2023-04-30 DIAGNOSIS — D631 Anemia in chronic kidney disease: Secondary | ICD-10-CM | POA: Diagnosis not present

## 2023-04-30 DIAGNOSIS — N390 Urinary tract infection, site not specified: Secondary | ICD-10-CM | POA: Diagnosis not present

## 2023-04-30 DIAGNOSIS — I700 Atherosclerosis of aorta: Secondary | ICD-10-CM | POA: Diagnosis not present

## 2023-04-30 DIAGNOSIS — M810 Age-related osteoporosis without current pathological fracture: Secondary | ICD-10-CM | POA: Diagnosis not present

## 2023-04-30 DIAGNOSIS — J45909 Unspecified asthma, uncomplicated: Secondary | ICD-10-CM | POA: Diagnosis not present

## 2023-04-30 DIAGNOSIS — D62 Acute posthemorrhagic anemia: Secondary | ICD-10-CM | POA: Diagnosis not present

## 2023-04-30 DIAGNOSIS — D61818 Other pancytopenia: Secondary | ICD-10-CM | POA: Diagnosis not present

## 2023-04-30 DIAGNOSIS — N179 Acute kidney failure, unspecified: Secondary | ICD-10-CM | POA: Diagnosis not present

## 2023-04-30 DIAGNOSIS — N2581 Secondary hyperparathyroidism of renal origin: Secondary | ICD-10-CM | POA: Diagnosis not present

## 2023-04-30 DIAGNOSIS — E785 Hyperlipidemia, unspecified: Secondary | ICD-10-CM | POA: Diagnosis not present

## 2023-04-30 DIAGNOSIS — M170 Bilateral primary osteoarthritis of knee: Secondary | ICD-10-CM | POA: Diagnosis not present

## 2023-04-30 DIAGNOSIS — H538 Other visual disturbances: Secondary | ICD-10-CM | POA: Diagnosis not present

## 2023-04-30 DIAGNOSIS — I051 Rheumatic mitral insufficiency: Secondary | ICD-10-CM | POA: Diagnosis not present

## 2023-04-30 DIAGNOSIS — K644 Residual hemorrhoidal skin tags: Secondary | ICD-10-CM | POA: Diagnosis not present

## 2023-04-30 DIAGNOSIS — I130 Hypertensive heart and chronic kidney disease with heart failure and stage 1 through stage 4 chronic kidney disease, or unspecified chronic kidney disease: Secondary | ICD-10-CM | POA: Diagnosis not present

## 2023-04-30 DIAGNOSIS — K219 Gastro-esophageal reflux disease without esophagitis: Secondary | ICD-10-CM | POA: Diagnosis not present

## 2023-04-30 DIAGNOSIS — E1122 Type 2 diabetes mellitus with diabetic chronic kidney disease: Secondary | ICD-10-CM | POA: Diagnosis not present

## 2023-05-01 ENCOUNTER — Other Ambulatory Visit: Payer: Self-pay | Admitting: Internal Medicine

## 2023-05-01 NOTE — Telephone Encounter (Signed)
 Copied from CRM [id number]. Topic: Clinical - Medication Refill >> May 01, 2023  3:00 PM Hector Shade B wrote: Most Recent Primary Care Visit:  Provider: Lovie Macadamia  Department: [facility]  Visit Type: OPEN ESTABLISHED  Date: 03/20/2023  Medication: febuxostat (ULORIC) 40 MG tablet  Has the patient contacted their pharmacy? No (Agent: If no, request that the patient contact the pharmacy for the refill. If patient does not wish to contact the pharmacy document the reason why and proceed with request.) (Agent: If yes, when and what did the pharmacy advise?)  Is this the correct pharmacy for this prescription? Yes If no, delete pharmacy and type the correct one.  This is the patient's preferred pharmacy:  [facility] STORE #[id number] [facility] [place], [place] - [address] AT [facility] [address] [place] [address] Phone: [phone] Fax: [phone]  [facility] Delivery - [place], [place] - [address] [place] [address] Phone: [phone] Fax: [phone]  [facility] [address] [place] [address] Phone: [phone] Fax: [phone]   Has the prescription been filled recently? No  Is the patient out of the medication? Yes  Has the patient been seen for an appointment in the last year OR does the patient have an upcoming appointment? Yes  Can we respond through MyChart? No  Agent: Please be advised that Rx refills may take up to 3 business days. We ask that you follow-up with your pharmacy.

## 2023-05-02 ENCOUNTER — Telehealth: Payer: Self-pay | Admitting: *Deleted

## 2023-05-02 NOTE — Telephone Encounter (Deleted)
 Copied from CRM [id number]. Topic: Clinical - Medication Refill >> May 01, 2023  3:00 PM Hector Shade B wrote: Most Recent Primary Care Visit:  Provider: Lovie Macadamia  Department: [facility]  Visit Type: OPEN ESTABLISHED  Date: 03/20/2023  Medication: febuxostat (ULORIC) 40 MG tablet  Has the patient contacted their pharmacy? No (Agent: If no, request that the patient contact the pharmacy for the refill. If patient does not wish to contact the pharmacy document the reason why and proceed with request.) (Agent: If yes, when and what did the pharmacy advise?)  Is this the correct pharmacy for this prescription? Yes If no, delete pharmacy and type the correct one.  This is the patient's preferred pharmacy:  [facility] STORE #[id number] [facility], [place] - [address] AT [facility] [address] [place] [address] Phone: [phone] Fax: [phone]  [facility] Delivery - [place], [place] - [address] [place] [address] Phone: [phone] Fax: [phone]  [facility] [address] [place] [address] Phone: [phone] Fax: [phone]   Has the prescription been filled recently? No  Is the patient out of the medication? Yes  Has the patient been seen for an appointment in the last year OR does the patient have an upcoming appointment? Yes  Can we respond through MyChart? No  Agent: Please be advised that Rx refills may take up to 3 business days. We ask that you follow-up with your pharmacy. >> May 02, 2023 11:08 AM Tiffany H wrote: Patient's daughter [name] called to follow up about Uloric 40mg  prescription. To Confirm, preferred pharmacy is:   [facility] #[id number] - [place], [place] - [address] AT [facility] [address] [place] [address] Phone: [phone] Fax: [phone]  Please assist. Advised daughter of turnaround time.   Prescription is for 20mg   doses. Pill comes in 40mg  and is cut in half. Patient has 7 days of medication. May require prior authorization. Please assist.

## 2023-05-02 NOTE — Telephone Encounter (Signed)
 error

## 2023-05-02 NOTE — Telephone Encounter (Unsigned)
 Copied from CRM [id number]. Topic: Clinical - Medication Refill >> May 02, 2023 11:08 AM Tiffany H wrote: Patient's daughter [name] called to follow up about Uloric 40mg  prescription. To Confirm, preferred pharmacy is:   [facility] STORE #[id number] - [place], [place] - [address] AT [facility] [address] [place] [address] Phone: [phone] Fax: [phone]  Please assist. Advised daughter of turnaround time.   Prescription is for 20mg  doses. Pill comes in 40mg  and is cut in half. Patient has 7 days of medication. May require prior authorization. Please assist.

## 2023-05-07 ENCOUNTER — Telehealth: Payer: Self-pay | Admitting: Internal Medicine

## 2023-05-07 ENCOUNTER — Other Ambulatory Visit: Payer: Self-pay | Admitting: Internal Medicine

## 2023-05-07 ENCOUNTER — Telehealth: Payer: Self-pay

## 2023-05-07 MED ORDER — FEBUXOSTAT 40 MG PO TABS: 20.0000 mg | ORAL_TABLET | Freq: Every day | ORAL | 5 refills | Status: DC

## 2023-05-07 MED ORDER — FEBUXOSTAT 40 MG PO TABS
20.0000 mg | ORAL_TABLET | Freq: Every day | ORAL | 5 refills | Status: DC
Start: 2023-05-07 — End: 2023-06-25

## 2023-05-07 NOTE — Telephone Encounter (Signed)
 Prior Authorization for patient (Febuxostat 40MG  tablets) came through on cover my meds was submitted per cover my meds.[name]  WUJ:[id number]  Sherry [name] (Key: [id number]) PA Case ID #: [id number] Need Help? Call us at [phone] Outcome Additional Information Required This medication or product was previously approved on [id number] from 2023-02-19 to 2024-02-18. **Please note: This request was submitted electronically. Formulary lowering, tiering exception, cost reduction and/or pre-benefit determination review (including prospective Medicare hospice reviews) requests cannot be requested using this method of submission. Providers contact us at [phone] for further assistance. Drug Febuxostat 40MG  tablets ePA cloud logo Form [employer] Medicare Part D Electronic Prior Authorization Form [phone] NCPDP)

## 2023-05-07 NOTE — Telephone Encounter (Signed)
 Copied from CRM [id number]. Topic: Clinical - Prescription Issue >> May 07, 2023  8:46 AM Alona Bene A wrote: Reason for CRM: Patient called in regarding medication febuxostat (ULORIC) 40 MG tablet. Patient stated she provided name of provider is Industrial/product designer. Please contact patient to provide update on medication. Please contact patient at [phone].

## 2023-05-07 NOTE — Telephone Encounter (Signed)
 I spoke with Sherry [name] on the phone. Patient's identity was confirmed using two patient specific identifiers.  We discussed her concerns regarding medication.  She does have a history of hyperuricemia and takes Febuxostat.  She says this medication helps with her lower extremity pain. Orders were placed earlier this evening by her PCP for refill.

## 2023-05-11 DIAGNOSIS — I5031 Acute diastolic (congestive) heart failure: Secondary | ICD-10-CM | POA: Diagnosis not present

## 2023-05-11 DIAGNOSIS — M7989 Other specified soft tissue disorders: Secondary | ICD-10-CM | POA: Diagnosis not present

## 2023-05-12 ENCOUNTER — Encounter (HOSPITAL_COMMUNITY): Payer: Self-pay | Admitting: Cardiology

## 2023-05-12 ENCOUNTER — Ambulatory Visit (HOSPITAL_COMMUNITY)
Admission: RE | Admit: 2023-05-12 | Discharge: 2023-05-12 | Disposition: A | Discharge status: Home / Self Care | Payer: 59 | Source: Ambulatory Visit | Attending: Cardiology | Admitting: Cardiology

## 2023-05-12 VITALS — BP 180/80 | HR 62

## 2023-05-12 DIAGNOSIS — Z993 Dependence on wheelchair: Secondary | ICD-10-CM | POA: Diagnosis not present

## 2023-05-12 DIAGNOSIS — I498 Other specified cardiac arrhythmias: Secondary | ICD-10-CM | POA: Insufficient documentation

## 2023-05-12 DIAGNOSIS — N184 Chronic kidney disease, stage 4 (severe): Secondary | ICD-10-CM | POA: Insufficient documentation

## 2023-05-12 DIAGNOSIS — Z8249 Family history of ischemic heart disease and other diseases of the circulatory system: Secondary | ICD-10-CM | POA: Diagnosis not present

## 2023-05-12 DIAGNOSIS — I444 Left anterior fascicular block: Secondary | ICD-10-CM | POA: Diagnosis not present

## 2023-05-12 DIAGNOSIS — M1990 Unspecified osteoarthritis, unspecified site: Secondary | ICD-10-CM | POA: Insufficient documentation

## 2023-05-12 DIAGNOSIS — I130 Hypertensive heart and chronic kidney disease with heart failure and stage 1 through stage 4 chronic kidney disease, or unspecified chronic kidney disease: Secondary | ICD-10-CM | POA: Diagnosis not present

## 2023-05-12 DIAGNOSIS — Z79899 Other long term (current) drug therapy: Secondary | ICD-10-CM | POA: Diagnosis not present

## 2023-05-12 DIAGNOSIS — I2510 Atherosclerotic heart disease of native coronary artery without angina pectoris: Secondary | ICD-10-CM | POA: Diagnosis not present

## 2023-05-12 DIAGNOSIS — I350 Nonrheumatic aortic (valve) stenosis: Secondary | ICD-10-CM | POA: Diagnosis not present

## 2023-05-12 DIAGNOSIS — Z66 Do not resuscitate: Secondary | ICD-10-CM | POA: Insufficient documentation

## 2023-05-12 DIAGNOSIS — I441 Atrioventricular block, second degree: Secondary | ICD-10-CM | POA: Insufficient documentation

## 2023-05-12 DIAGNOSIS — R5381 Other malaise: Secondary | ICD-10-CM | POA: Diagnosis not present

## 2023-05-12 DIAGNOSIS — I5032 Chronic diastolic (congestive) heart failure: Secondary | ICD-10-CM | POA: Insufficient documentation

## 2023-05-12 LAB — CBC
HCT: 29.9 % — ABNORMAL LOW (ref 36.0–46.0)
Hemoglobin: 9.1 g/dL — ABNORMAL LOW (ref 12.0–15.0)
MCH: 29.1 pg (ref 26.0–34.0)
MCHC: 30.4 g/dL (ref 30.0–36.0)
MCV: 95.5 fL (ref 80.0–100.0)
Platelets: 168 10*3/uL (ref 150–400)
RBC: 3.13 MIL/uL — ABNORMAL LOW (ref 3.87–5.11)
RDW: 16.2 % — ABNORMAL HIGH (ref 11.5–15.5)
WBC: 2.1 10*3/uL — ABNORMAL LOW (ref 4.0–10.5)
nRBC: 0 % (ref 0.0–0.2)

## 2023-05-12 LAB — BASIC METABOLIC PANEL WITH GFR
Anion gap: 13 (ref 5–15)
BUN: 115 mg/dL — ABNORMAL HIGH (ref 8–23)
CO2: 21 mmol/L — ABNORMAL LOW (ref 22–32)
Calcium: 8.2 mg/dL — ABNORMAL LOW (ref 8.9–10.3)
Chloride: 109 mmol/L (ref 98–111)
Creatinine, Ser: 3.61 mg/dL — ABNORMAL HIGH (ref 0.44–1.00)
GFR, Estimated: 13 mL/min — ABNORMAL LOW
Glucose, Bld: 93 mg/dL (ref 70–99)
Potassium: 4.9 mmol/L (ref 3.5–5.1)
Sodium: 143 mmol/L (ref 135–145)

## 2023-05-12 LAB — LIPID PANEL
Cholesterol: 134 mg/dL (ref 0–200)
HDL: 59 mg/dL (ref >40)
LDL Cholesterol: 60 mg/dL (ref 0–99)
Total CHOL/HDL Ratio: 2.3 ratio
Triglycerides: 76 mg/dL (ref <150)
VLDL: 15 mg/dL (ref 0–40)

## 2023-05-12 LAB — BRAIN NATRIURETIC PEPTIDE: B Natriuretic Peptide: 1317.5 pg/mL — ABNORMAL HIGH (ref 0.0–100.0)

## 2023-05-12 MED ORDER — AMLODIPINE BESYLATE 2.5 MG PO TABS: 2.5000 mg | ORAL_TABLET | Freq: Every day | ORAL | 3 refills | Status: DC

## 2023-05-12 MED ORDER — EMPAGLIFLOZIN 10 MG PO TABS: 10.0000 mg | ORAL_TABLET | Freq: Every day | ORAL | 3 refills | Status: DC

## 2023-05-12 NOTE — Patient Instructions (Signed)
 START Amlodipine 2.5 mg daily.  RESTART Your Jardiance 10 mg daily.  Labs done today, your results will be available in MyChart, we will contact you for abnormal readings.  Repeat blood work in 10 days.  Your physician recommends that you schedule a follow-up appointment in: 2 months.  If you have any questions or concerns before your next appointment please send [place] a message through [place] or call our office at [phone].    TO LEAVE A MESSAGE FOR THE NURSE SELECT OPTION 2, PLEASE LEAVE A MESSAGE INCLUDING: YOUR NAME DATE OF BIRTH CALL BACK NUMBER REASON FOR CALL**this is important as we prioritize the call backs  YOU WILL RECEIVE A CALL BACK THE SAME DAY AS LONG AS YOU CALL BEFORE 4:00 PM  At the [facility], you and your health needs are our priority. As part of our continuing mission to provide you with exceptional heart care, we have created designated Provider Care Teams. These Care Teams include your primary Cardiologist (physician) and Advanced Practice Providers (APPs- Physician Assistants and Nurse Practitioners) who all work together to provide you with the care you need, when you need it.   You may see any of the following providers on your designated Care Team at your next follow up: Dr Arvilla Meres Dr Marca Ancona Dr. Dorthula Nettles Dr. Clearnce Hasten Amy Filbert Schilder, NP Robbie Lis, [place] [facility] [place], [place] Brynda Peon, NP [place] Lee, NP Clarisa Kindred, NP Karle Plumber, PharmD Enos Fling, PharmD   Please be sure to bring in all your medications bottles to every appointment.    Thank you for choosing [facility]

## 2023-05-12 NOTE — Progress Notes (Signed)
 [facility] Note    PCP: Gwenevere Abbot, MD Cardiology: Dr. Excell Seltzer HF Cardiology: Dr. Shirlee Latch  Chief complaint: CHF  78 y.o. with history of aortic stenosis, chronic diastolic CHF, and CAD was referred by Dr. Excell Seltzer for evaluation of CHF.  Based on 12/21 echo and RHC/LHC in 12/21, the patient has moderate aortic stenosis. She had coronary angiography in 4/15 with rather diffuse moderate-severe coronary disease that was managed medically.  Cardiac MRI in 11/19 was concerning for cardiac amyloidosis. However, PYP scan was negative in 11/19.  Workup at that time also did not show a monoclonal protein to suggest AL amyloidosis.    Echo in 11/22 showed EF 60-65%, severe LVH, normal RV size and systolic function, mild MR, paradoxical low flow/low gradient severe aortic stenosis with mean gradient 24 mmHg and AVA 0.88 cm^2, small to moderate pericardial effusion.  PYP scan was repeated in 11/22, grade 1 with H/CL 1.07.  This is unlikely to be TTR cardiac amyloidosis.  Repeat urine immunofixation and myeloma panel were negative as well.  Invitae gene testing negative for hATTR mutation.   She was evaluated by hematology for pancytopenia.  This improved without intervention.    Echo in 9/23 showed EF 60-65%, severe concentric LVH, normal RV, PASP 43, moderate pericardial effusion without tamponade, mild-moderate MR, moderate mitral stenosis with mean gradient 7 mmHg, severe low flow/low gradient AS mean gradient 30 with AVA 0.95 cm^2, IVC normal.   Admitted 7/24 with CHF. Echo showed AS severe. Structural Team consulted and felt not TAVR candidate. Diuresed with IV lasix and GDMT titrated, limited by kidney function. She was seen by PMT and code updated to DNR, with plans for palliative care outpatient.  She was discharged home, weight 171 lbs.  Admitted 1/25 with anemia, hgb 4.6 on admission. Received PRBCs. Cards consulted for peri-op risk stratification. She underwent EGD showing  erythematous pyloric regions (biopsied), otherwise unremarkable. Colonoscopy showed no active sources of bleeding, polyps removed for biopsy. She was discharged home, weight 144 lbs  Today she returns for post hospital HF follow up with her husband. She remains minimally active, wheelchair-bound.  Minimal walking/standing. No dypsnea with activity around the house using her arms.  No orthopnea/PND.  No lightheadedness or chest pain. BP is elevated.   ECG (personally reviewed): NSR 1st degree AVB, PR 330 msec; LVH with repolarization abnormality  Labs (6/23): LDL 119 Labs (9/23): K 4.1, creatinine 2.11 Labs (11/23): K 3.3, creatinine 2.29 Labs (7/24): K 3.5, creatinine 2.65 Labs (9/24): K 3.7, creatinine 3.49  Labs (10/24): K 3.7, creatinine 2.94 Labs (1/25): K 4.7, creatinine 3.09, hgb 9.1 Labs (2/25): BNP 2975, K 4.4, creatinine 3.19  PMH: 1. CKD stage 3 2. H/o seizure disorder 3. H/o TIA 4. H/o Mobitz type 1 2nd degree AVB 5. HTN 6. CAD: LHC (4/15) with 80% and 50% tandem stenoses in the proximal LAD, 80% mid LCx, 85% dLCx, diffuse mid-distal disease up to 90% in RCA.  Diffuse coronary disease, treated medically.  7. Aortic stenosis: Suspect moderate AS. - Echo (12/21): AoV mean gradient 32, AVA 1.2 cm^2.  - RHC/LHC (12/21): AoV mean gradient 22, AVA 1.47 cm^2.  - Echo (11/22): EF 60-65%, severe LVH, normal RV size and systolic function, mild MR, paradoxical low flow/low gradient severe aortic stenosis with mean gradient 24 mmHg and AVA 0.88 cm^2, small to moderate pericardial effusion.  - Echo (7/24): severe AS, mean gradient worsened to 50 mmHg. 8. Chronic diastolic CHF:  - Cardiac MRI (16/10):  EF 76%, moderate LVH, difficult to null LV myocardium with diffuse subendocardial LGE concerning for possible cardiac amyloidosis, moderate pericardial effusion.  - PYP scan (11/19): grade 0, H/CL 1.08.  - Echo (12/21): EF 60-65%, moderate LVH, normal RV, moderate pericardial effusion,  moderate AS with AoV mean gradient 32, AVA 1.2 cm^2.  - Echo (11/22): EF 60-65%, severe LVH, normal RV size and systolic function, mild MR, paradoxical low flow/low gradient severe aortic stenosis with mean gradient 24 mmHg and AVA 0.88 cm^2, small to moderate pericardial effusion.   - PYP scan (11/22): grade 1 with H/CL 1.07.  This is unlikely to be TTR cardiac amyloidosis. - Invitae gene testing negative for hATTR mutation.  - Echo (9/23): EF 60-65%, severe concentric LVH, normal RV, PASP 43, moderate pericardial effusion without tamponade, mild-moderate MR, moderate mitral stenosis with mean gradient 7 mmHg, severe low flow/low gradient AS mean gradient 30 with AVA 0.95 cm^2, IVC normal.  - Echo (7/24): EF 60-65%, severe LVH, normal RV, severe MAC, severe AS AVA 0.62 cm, AoV mean gradient 50 mmHg  9. Arthritis: Severe, limited to wheelchair.  10. Hyperlipidemia  Social History   Socioeconomic History   Marital status: Married    Spouse name: Johnny   Number of children: 2   Years of education: 4   Highest education level: Not on file  Occupational History   Occupation: retired  Tobacco Use   Smoking status: Former    Current packs/day: 0.00    Average packs/day: 0.5 packs/day for 10.0 years (5.0 ttl pk-yrs)    Types: Cigarettes    Start date: 06/04/1990    Quit date: 06/03/2000    Years since quitting: 22.9   Smokeless tobacco: Never  Vaping Use   Vaping status: Never Used  Substance and Sexual Activity   Alcohol use: No    Alcohol/week: 0.0 standard drinks of alcohol   Drug use: No   Sexual activity: Not Currently    Partners: Male  Other Topics Concern   Not on file  Social History Narrative   Current Social History 06/20/2020        Patient lives with spouse in a home which is 1 story. There are not steps up to the entrance, the patient uses a ramp.       Patient's method of transportation is [employer].      The highest level of education was some high school.      The  patient currently disabled.      Identified important Relationships are "God and my husband"       Pets : None       Interests / Fun: "Sitting on my front porch"       Current Stressors: None        Son passed away.      Social Drivers of Corporate investment banker Strain: Low Risk  (09/18/2022)   Overall Financial Resource Strain (CARDIA)    Difficulty of Paying Living Expenses: Not hard at all  Food Insecurity: No Food Insecurity (03/05/2023)   Hunger Vital Sign    Worried About Running Out of Food in the Last Year: Never true    Ran Out of Food in the Last Year: Never true  Transportation Needs: No Transportation Needs (03/05/2023)   PRAPARE - Administrator, Civil Service (Medical): No    Lack of Transportation (Non-Medical): No  Physical Activity: Inactive (09/18/2022)   Exercise Vital Sign    Days of Exercise  per Week: 0 days    Minutes of Exercise per Session: 0 min  Stress: No Stress Concern Present (09/18/2022)   [employer] of Occupational Health - Occupational Stress Questionnaire    Feeling of Stress : Not at all  Social Connections: Moderately Isolated (02/25/2023)   Social Connection and Isolation Panel [NHANES]    Frequency of Communication with Friends and Family: More than three times a week    Frequency of Social Gatherings with Friends and Family: Twice a week    Attends Religious Services: Never    Database administrator or Organizations: No    Attends Banker Meetings: Never    Marital Status: Married  Catering manager Violence: Not At Risk (03/05/2023)   Humiliation, Afraid, Rape, and Kick questionnaire    Fear of Current or Ex-Partner: No    Emotionally Abused: No    Physically Abused: No    Sexually Abused: No   Family History  Problem Relation Age of Onset   Hypertension Other    Heart disease Mother    Hypertension Mother    Cancer Father    Obesity Son    Asthma Sister    Heart disease Sister    Heart disease  Sister    ROS: All systems reviewed and negative except as per HPI.   Current Outpatient Medications  Medication Sig Dispense Refill   acetaminophen (TYLENOL) 325 MG tablet Take 2 tablets (650 mg total) by mouth every 6 (six) hours as needed for mild pain (or Fever >/= 101). 100 tablet 0   albuterol (PROVENTIL) (2.5 MG/3ML) 0.083% nebulizer solution Take 3 mLs (2.5 mg total) by nebulization every 6 (six) hours as needed for wheezing. 90 mL 12   amLODipine (NORVASC) 2.5 MG tablet Take 1 tablet (2.5 mg total) by mouth daily. 90 tablet 3   aspirin 81 MG chewable tablet Chew 1 tablet (81 mg total) by mouth daily. 90 tablet 0   calcium carbonate (OSCAL) 1500 (600 Ca) MG TABS tablet Take 1 tablet (1,500 mg total) by mouth 3 (three) times daily with meals. 90 tablet 0   cholecalciferol (VITAMIN D3) 25 MCG (1000 UNIT) tablet Take 1 tablet (1,000 Units total) by mouth daily. 30 tablet 11   febuxostat (ULORIC) 40 MG tablet Take 0.5 tablets (20 mg total) by mouth daily. 30 tablet 5   hydrALAZINE (APRESOLINE) 100 MG tablet TAKE 1 TABLET(100 MG) BY MOUTH THREE TIMES DAILY 90 tablet 11   lidocaine (LMX) 4 % cream Apply 1 Application topically 2 (two) times daily as needed (Pain on sacral wound area). 30 g 0   multivitamin (RENA-VIT) TABS tablet Take 1 tablet by mouth at bedtime. 100 tablet 0   pantoprazole (PROTONIX) 40 MG tablet TAKE 1 TABLET BY MOUTH DAILY 100 tablet 2   PHENobarbital (LUMINAL) 64.8 MG tablet Take 2 tablets (129.6 mg total) by mouth at bedtime. 200 tablet 3   potassium chloride SA (KLOR-CON M) 20 MEQ tablet Take 1 tablet (20 mEq total) by mouth daily. 90 tablet 3   rosuvastatin (CRESTOR) 40 MG tablet Take 1 tablet (40 mg total) by mouth daily. 90 tablet 3   senna-docusate (SENOKOT S) 8.6-50 MG tablet Take 2 tablets by mouth daily. 180 tablet 0   sevelamer carbonate (RENVELA) 800 MG tablet Take 1 tablet (800 mg total) by mouth 3 (three) times daily with meals. 90 tablet 11   torsemide  (DEMADEX) 20 MG tablet Take 3 tablets (60 mg total) by mouth daily. TAKE  2 TABLETS BY MOUTH DAILY AND AN EXTRA DOSE(2 TABLETS) IN THE PM FOR SWELLING OR SHORTNESS OF BREATH 270 tablet 6   zonisamide (ZONEGRAN) 100 MG capsule TAKE 1 CAPSULE BY MOUTH AT NIGHT 100 capsule 3   empagliflozin (JARDIANCE) 10 MG TABS tablet Take 1 tablet (10 mg total) by mouth daily before breakfast. 90 tablet 3   No current facility-administered medications for this encounter.   Wt Readings from Last 3 Encounters:  04/24/23 64.7 kg (142 lb 9.6 oz)  03/14/23 65.3 kg (144 lb)  02/28/23 65.3 kg (144 lb)   BP (!) 180/80   Pulse 62   SpO2 99%   PHYSICAL EXAM: General: NAD Neck: No JVD, no thyromegaly or thyroid nodule.  Lungs: Clear to auscultation bilaterally with normal respiratory effort. CV: Nondisplaced PMI.  Heart regular S1/S2, no S3/S4, 3/6 SEM RUSB with muffled S2.  No peripheral edema.  No carotid bruit.  Normal pedal pulses.  Abdomen: Soft, nontender, no hepatosplenomegaly, no distention.  Skin: Intact without lesions or rashes.  Neurologic: Alert and oriented x 3.  Psych: Normal affect. Extremities: No clubbing or cyanosis.  HEENT: Normal.   Assessment/Plan 1. Aortic stenosis: 12/21 LHC/RHC and echo were suggestive of moderate aortic stenosis.  Repeat echo in 11/22 showed paradoxical low flow/low gradient severe aortic stenosis, echo in 9/23 was similar.  Echo this admission (7/24) with severe AS, gradient higher.  She has been seen by Dr. Excell Seltzer in the past for TAVR evaluation.  She is very limited in terms of activity, basically wheel-chair bound at baseline. She has a long 1st degree AVB and has episodes of type 1 2nd degree AVB.  She also has CKD stage IV.  TAVR could be done, but high risk for progression to ESRD and need for pacemaker.  She would have a hard time with HD due to lack of mobility.  Evaluated by structural heart team, determined not a TAVR candidate.  2. CAD: Moderate-severe diffuse  CAD on coronary angiography in 4/15. She would not be a candidate for CABG due to lack of mobility. No chest pain. - Continue ASA 81 - Continue Crestor, check lipids today.  3. CKD stage IV/V: Baseline creatinine >3. Followed by Dr. Signe Colt  - Restart Jardiance 10 mg daily.  - BMET/BNP today and BMET in 10 days.  4. Chronic diastolic CHF: Cardiac MRI was concerning for cardiac amyloidosis (11/19), as was echo with moderate LVH and aortic stenosis. The aortic stenosis and the pericardial effusion seen by echo are also frequently seen with cardiac amyloidosis. She additionally has tingling/numbness in hands/feet that is consistent with peripheral neuropathy.  However, PYP scan in 11/19 was negative and myeloma workup was negative.  Repeat PYP scan in 11/22 was again negative and she did not have a transthyretin gene mutation on Invitae gene testing.  Myeloma workup was again negative in 10/22.  Therefore, cardiac amyloidosis seems unlikely and LVH may be due to HTN and aortic stenosis though delayed enhancement from MRI is more suggestive of infiltrative disease. Most recent echo showed EF 60-65%, severe LVH, normal RV, small to moderate pericardial effusion (unchanged), severe AS with mean gradient 50 mmHg (AS has been severe in past but mean gradient has progressed). She does not look volume overloaded on exam today.  - Continue torsemide 60 mg daily.  BMET/BNP today.  - Continue KCL 20 daily. - Restart Jardiance 10 mg daily. BMET in 10 days. - Continue compression hose. - No ARB/ARNi or spiro with CKD IV.  5. HTN: BP elevated.  - Continue hydralazine 100 mg tid. - Add amlodipine 2.5 mg daily. Titrate up as needed.  6. Conduction abnormality: Long 1st degree AVB, also runs of type 1 2nd degree AVB have been noted.   - Avoid beta blockade.  7. Debility: 2/2 severe arthritis. Minimally mobile. This is her baseline.  - She is unable to stand for weight today. No change.  Follow up in 2 months with APP.    I spent 32 minutes reviewing data, interviewing patient and his family, and organizing the orders/followup.    Marca Ancona,  05/12/23

## 2023-05-14 ENCOUNTER — Other Ambulatory Visit (HOSPITAL_COMMUNITY): Payer: Self-pay

## 2023-05-14 ENCOUNTER — Telehealth (HOSPITAL_COMMUNITY): Payer: Self-pay

## 2023-05-14 MED ORDER — TORSEMIDE 20 MG PO TABS: ORAL_TABLET | ORAL | 6 refills | Status: DC

## 2023-05-14 MED ORDER — TORSEMIDE 20 MG PO TABS: ORAL_TABLET | ORAL | 3 refills | Status: DC

## 2023-05-14 NOTE — Telephone Encounter (Addendum)
 Pt aware, agreeable, and verbalized understanding  Med list up dated has lab appointment already scheduled   ----- Message from Marca Ancona sent at 05/12/2023  4:53 PM EDT ----- Creatinine higher.  Stop KCl.  Decrease torsemide to 60 daily alternating with 40 daily. BMET 1 week.

## 2023-05-23 ENCOUNTER — Other Ambulatory Visit: Payer: Self-pay | Admitting: Neurology

## 2023-05-23 MED ORDER — PHENOBARBITAL 64.8 MG PO TABS: 129.6000 mg | ORAL_TABLET | Freq: Every day | ORAL | 3 refills | Status: DC

## 2023-05-23 NOTE — Telephone Encounter (Signed)
 Pt called AN, she needs a refill on her phenobarbital.   [facility]

## 2023-05-26 ENCOUNTER — Telehealth (HOSPITAL_COMMUNITY): Payer: Self-pay | Admitting: Licensed Clinical Social Worker

## 2023-05-26 ENCOUNTER — Ambulatory Visit (HOSPITAL_COMMUNITY)
Admission: RE | Admit: 2023-05-26 | Discharge: 2023-05-26 | Disposition: A | Discharge status: Home / Self Care | Source: Ambulatory Visit | Attending: Cardiology | Admitting: Cardiology

## 2023-05-26 DIAGNOSIS — I5032 Chronic diastolic (congestive) heart failure: Secondary | ICD-10-CM | POA: Diagnosis not present

## 2023-05-26 LAB — BASIC METABOLIC PANEL WITH GFR
Anion gap: 15 (ref 5–15)
BUN: 100 mg/dL — ABNORMAL HIGH (ref 8–23)
CO2: 21 mmol/L — ABNORMAL LOW (ref 22–32)
Calcium: 8.2 mg/dL — ABNORMAL LOW (ref 8.9–10.3)
Chloride: 108 mmol/L (ref 98–111)
Creatinine, Ser: 3.51 mg/dL — ABNORMAL HIGH (ref 0.44–1.00)
GFR, Estimated: 13 mL/min — ABNORMAL LOW (ref >60)
Glucose, Bld: 79 mg/dL (ref 70–99)
Potassium: 4 mmol/L (ref 3.5–5.1)
Sodium: 144 mmol/L (ref 135–145)

## 2023-05-26 NOTE — Telephone Encounter (Signed)
 H&V Care Navigation CSW Progress Note  Clinical Social Worker consulted to help with transportation to lab appt today- usually comes by [facility] but was unable to set up for this.  Pt reports she is safe to use taxi as her spouse will come with her- she utilizes a wheelchair but her spouse will help her get into the car and out of the car.  Taxi will pick patient up at 9:30am   SDOH Screenings   Food Insecurity: No Food Insecurity (03/05/2023)  Housing: Low Risk  (03/05/2023)  Transportation Needs: No Transportation Needs (03/05/2023)  Utilities: Not At Risk (03/05/2023)  Alcohol Screen: Low Risk  (09/18/2022)  Depression (PHQ2-9): Low Risk  (03/20/2023)  Financial Resource Strain: Low Risk  (09/18/2022)  Physical Activity: Inactive (09/18/2022)  Social Connections: Moderately Isolated (02/25/2023)  Stress: No Stress Concern Present (09/18/2022)  Tobacco Use: Medium Risk (05/12/2023)  Health Literacy: Adequate Health Literacy (09/18/2022)    05/26/2023  Sherry [name] DOB: [birth year] MRN: [id number]   RIDER WAIVER AND RELEASE OF LIABILITY  For the purposes of helping with transportation needs, [facility] partners with outside transportation providers (taxi companies, [place], Catering manager.) to give [name] patients or other approved people the choice of on-demand rides [employer]") to our buildings for non-emergency visits.  By using [employer], I, the person signing this document, on behalf of myself and/or any legal minors (in my care using the [employer]), agree:  Science writer given to me are supplied by independent, outside transportation providers who do not work for, or have any affiliation with, [name]. [place] is not a transportation company. [facility] has no control over the quality or safety of the rides I get using [employer]. [place] has no control over whether any outside ride will happen on time or not. [facility] gives no guarantee  on the reliability, quality, safety, or availability on any rides, or that no mistakes will happen. I know and accept that traveling by vehicle (car, truck, SVU, Zenaida Niece, bus, taxi, etc.) has risks of serious injuries such as disability, being paralyzed, and death. I know and agree the risk of using [employer] is mine alone, and not [employer]. Transport Services are provided "as is" and as are available. The transportation providers are in charge for all inspections and care of the vehicles used to provide these rides. I agree not to take legal action against [place], its agents, employees, officers, directors, representatives, insurers, attorneys, assigns, successors, subsidiaries, and affiliates at any time for any reasons related directly or indirectly to using [employer]. I also agree not to take legal action against [place] or its affiliates for any injury, death, or damage to property caused by or related to using [employer]. I have read this Waiver and Release of Liability, and I understand the terms used in it and their legal meaning. This Waiver is freely and voluntarily given with the understanding that my right (or any legal minors) to legal action against [place] relating to [employer] is knowingly given up to use these services.   I attest that I read the Ride Waiver and Release of Liability to Sherry [name], gave [name] the opportunity to ask questions and answered the questions asked (if any). I affirm that Sherry [name] then provided consent for assistance with transportation.     [name]

## 2023-06-11 DIAGNOSIS — M7989 Other specified soft tissue disorders: Secondary | ICD-10-CM | POA: Diagnosis not present

## 2023-06-11 DIAGNOSIS — I5031 Acute diastolic (congestive) heart failure: Secondary | ICD-10-CM | POA: Diagnosis not present

## 2023-06-24 ENCOUNTER — Ambulatory Visit (HOSPITAL_COMMUNITY)
Admission: RE | Admit: 2023-06-24 | Discharge: 2023-06-24 | Disposition: A | Discharge status: Home / Self Care | Source: Ambulatory Visit | Attending: Internal Medicine | Admitting: Internal Medicine

## 2023-06-24 ENCOUNTER — Ambulatory Visit (INDEPENDENT_AMBULATORY_CARE_PROVIDER_SITE_OTHER): Admitting: Internal Medicine

## 2023-06-24 ENCOUNTER — Encounter: Payer: Self-pay | Admitting: Internal Medicine

## 2023-06-24 VITALS — BP 202/84 | HR 63 | Temp 98.1°F | Ht 61.0 in | Wt 151.2 lb

## 2023-06-24 DIAGNOSIS — I10 Essential (primary) hypertension: Secondary | ICD-10-CM | POA: Diagnosis not present

## 2023-06-24 DIAGNOSIS — I120 Hypertensive chronic kidney disease with stage 5 chronic kidney disease or end stage renal disease: Secondary | ICD-10-CM

## 2023-06-24 DIAGNOSIS — D649 Anemia, unspecified: Secondary | ICD-10-CM | POA: Diagnosis not present

## 2023-06-24 DIAGNOSIS — R609 Edema, unspecified: Secondary | ICD-10-CM | POA: Diagnosis not present

## 2023-06-24 DIAGNOSIS — M7989 Other specified soft tissue disorders: Secondary | ICD-10-CM

## 2023-06-24 DIAGNOSIS — N185 Chronic kidney disease, stage 5: Secondary | ICD-10-CM

## 2023-06-24 MED ORDER — ASPIRIN 81 MG PO CHEW: 81.0000 mg | CHEWABLE_TABLET | Freq: Every day | ORAL | Status: DC

## 2023-06-24 MED ORDER — AMLODIPINE BESYLATE 10 MG PO TABS: 10.0000 mg | ORAL_TABLET | Freq: Every day | ORAL | 3 refills | Status: AC

## 2023-06-24 NOTE — Assessment & Plan Note (Signed)
 Pt with 1+ pitting edema of bilateral lower extremities. She does have 2.5 cm size difference between her calves with right>left. It is non-tender but given her chronic immobility and increased inflammatory state, will get doppler to rule out DVT.   Fortunately, her DVT study came back without any signs of DVT.

## 2023-06-24 NOTE — Patient Instructions (Addendum)
 Sherry [name], it was a pleasure seeing you today! You endorsed feeling well today. Below are some of the things we talked about this visit. We look forward to seeing you in the follow up appointment!  Today we discussed: For your high blood pressure, make sure you are taking your medicines. Looks like you are taking amlodipine  at 2.5 mg but I want you to take 10 mg daily. I will call you to find out the dosage.   For your leg swelling, I am checking an ultrasound of the legs to see if you have a clot.   I have ordered the following labs today:  Lab Orders         BMP8+Anion Gap        Referrals ordered today:   Referral Orders  No referral(s) requested today     I have ordered the following medication/changed the following medications:   Stop the following medications: Medications Discontinued During This Encounter  Medication Reason   aspirin  81 MG chewable tablet Reorder   amLODipine  (NORVASC ) 2.5 MG tablet Reorder     Start the following medications: Meds ordered this encounter  Medications   aspirin  81 MG chewable tablet    Sig: Chew 1 tablet (81 mg total) by mouth daily.    full otc bottle   amLODipine  (NORVASC ) 10 MG tablet    Sig: Take 1 tablet (10 mg total) by mouth daily.    Dispense:  90 tablet    Refill:  3     Follow-up: 1 month follow up for high blood pressure   Please make sure to arrive 15 minutes prior to your next appointment. If you arrive late, you may be asked to reschedule.   We look forward to seeing you next time. Please call our clinic at [phone] if you have any questions or concerns. The best time to call is Monday-Friday from 9am-4pm, but there is someone available 24/7. If after hours or the weekend, call the main hospital number and ask for the Internal Medicine Resident On-Call. If you need medication refills, please notify your pharmacy one week in advance and they will send us  a request.  Thank you for letting us  take part  in your care. Wishing you the best!  Thank you, Jackolyn Masker, MD

## 2023-06-24 NOTE — Assessment & Plan Note (Signed)
 Pt with hgb that is stable around 9.1. Will repeat CBC with BMP to ensure no further decline in her hemoglobin.

## 2023-06-24 NOTE — Progress Notes (Unsigned)
 CC: follow up  HPI:  Sherry [name] is a 78 y.o. with medical history of HTN, HLD, Aortic Stenosis, CHF, CKD IV, osteoporosis, hx of CVA presenting to [facility] for PCP follow up.   Please see problem-based list for further details, assessments, and plans.  Past Medical History:  Diagnosis Date   Abdominal pain 02/25/2023   Abscess 10/30/2022   Acute diastolic congestive heart failure (HCC) 08/31/2022   Acute kidney injury superimposed on CKD (HCC) 04/28/2011   AKI (acute kidney injury) (HCC) 01/31/2020   Anemia    Aortic stenosis    Arthritis    Bilateral lower extremity edema    Bilateral lower extremity edema    Blurry vision, bilateral 05/08/2016   Bradycardia    a. 01/2013 - asymptomatic.   Chronic diastolic heart failure (HCC)    Chronic sinus bradycardia 07/02/2012   CKD (chronic kidney disease)    a. baseline CKD stage III   CKD (chronic kidney disease) stage 4, GFR 15-29 ml/min (HCC) 09/17/2022   Coronary artery disease    Electrolyte abnormality 08/30/2022   Epilepsy (HCC) 06/04/2006   EEG 02/19/12 " Interpretation:  This is an abnormal EEG demonstrating continuous and generalized slowing of the background in the theta frequency. Irregular rhythm of EKG noted.  Clinical correlation: the generalized slowing is consistent with a mild encephalopathy of nonspecific etiologies for which the differential would include infectious, toxic, metabolic, inflammatory, and vascular etiologies.    GERD (gastroesophageal reflux disease)    Headache(784.0)    Heart block AV second degree 02/15/2013   Heart murmur    Hyperlipidemia    Hypertension    Hypocalcemia 08/27/2022   Patient with chronic hypocalcemia, found to have a critical calcium  of 5.8 on CMP today.  Albumin 2.7, corrected calcium  6.8.  Likely multifactorial due to CKD, vitamin D  deficiency.  This was discussed extensively with the patient and family.  Risks and benefits of inpatient management discussed and family  decided to continue with current plan of outpatient management with close follow-up.  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 -Orbits: Grossly unremarkable. -Paranasal sinuses: Imaged portions clear. -Brain: No acute abnormalities such as hemorrhage, hydrocephalus, acute ischemia, or evidence of a mass lesion.  There is patchy bilateral cerebral white matter T2 and flair signal hyper intensities.  Centimeter or smaller CSF intensity   Melena 02/25/2023   Mobitz (type) I [facility]) atrioventricular block    a. 01/2013 - asymptomatic.   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   TIA (transient ischemic attack) 2014   pt stated she had "mini strokes"   UTI (urinary tract infection) 03/07/2022   Vitamin D  deficiency 04/22/2014    Current Outpatient Medications (Endocrine & Metabolic):    empagliflozin  (JARDIANCE ) 10 MG TABS tablet, Take 1 tablet (10 mg total) by mouth daily  before breakfast.  Current Outpatient Medications (Cardiovascular):    amLODipine  (NORVASC ) 10 MG tablet, Take 1 tablet (10 mg total) by mouth daily.   hydrALAZINE  (APRESOLINE ) 100 MG tablet, TAKE 1 TABLET(100 MG) BY MOUTH THREE TIMES DAILY   rosuvastatin  (CRESTOR ) 40 MG tablet, Take 1 tablet (40 mg total) by mouth daily.   torsemide  (DEMADEX ) 20  MG tablet, Take 60 mg ( 3 Tabs) daily, Alternating with 40 mg ( 2 Tabs) daily  Current Outpatient Medications (Respiratory):    albuterol  (PROVENTIL ) (2.5 MG/3ML) 0.083% nebulizer solution, Take 3 mLs (2.5 mg total) by nebulization every 6 (six) hours as needed for wheezing.  Current Outpatient Medications (Analgesics):    acetaminophen  (TYLENOL ) 325 MG tablet, Take 2 tablets (650 mg total) by mouth every 6 (six) hours as needed for mild pain (or Fever >/= 101).   aspirin  81 MG chewable tablet, Chew 1 tablet (81 mg total) by mouth daily.   febuxostat  (ULORIC ) 40 MG tablet, Take 0.5 tablets (20 mg total) by mouth daily.   Current Outpatient Medications (Other):    calcium  carbonate (OSCAL) 1500 (600 Ca) MG TABS tablet, Take 1 tablet (1,500 mg total) by mouth 3 (three) times daily with meals.   cholecalciferol  (VITAMIN D3) 25 MCG (1000 UNIT) tablet, Take 1 tablet (1,000 Units total) by mouth daily.   lidocaine  (LMX) 4 % cream, Apply 1 Application topically 2 (two) times daily as needed (Pain on sacral wound area).   multivitamin (RENA-VIT) TABS tablet, Take 1 tablet by mouth at bedtime.   pantoprazole  (PROTONIX ) 40 MG tablet, TAKE 1 TABLET BY MOUTH DAILY   PHENobarbital  (LUMINAL) 64.8 MG tablet, Take 2 tablets (129.6 mg total) by mouth at bedtime.   sevelamer  carbonate (RENVELA ) 800 MG tablet, Take 1 tablet (800 mg total) by mouth 3 (three) times daily with meals.   zonisamide  (ZONEGRAN ) 100 MG capsule, TAKE 1 CAPSULE BY MOUTH AT NIGHT  Review of Systems:  Review of system negative unless stated in the problem list or HPI.    Physical Exam:  Vitals:   06/24/23 1013 06/24/23 1129  BP: (!) 215/89 (!) 202/84  Pulse: 64 63  Temp: 98.1 F (36.7 C)   TempSrc: Oral   SpO2: 100%   Weight: 151 lb 3.2 oz (68.6 kg)   Height: 5\' 1"  (1.549 m)    Physical Exam General: NAD, wheel chair dependant.  HENT: NCAT Lungs: CTAB, no wheeze, rhonchi or rales.  Cardiovascular: Normal heart sounds, 4/6  systolic murmur, 2+ pulses in all extremities. 1+ LE edema Abdomen: No TTP, normal bowel sounds MSK: right lower calf with greater diameter then left.  Skin: no lesions noted on exposed skin Neuro: Alert and oriented x4. CN grossly intact Psych: Normal mood and normal affect   Assessment & Plan:   Essential hypertension Pt with HTN that is uncontrolled. Blood pressure elevated in the clinic. Pt is asymptomatic. Her regimen is amlodipine  2.5 mg, hydralazine  100 mg TID, Jardiance  10 mg every day. Torsemide  60 mg and 40 mg alternating. Previously she was on amlodipine  10 mg but this was changed to few weeks ago. Her creatinine stable at 3.5 one month ago. Will increase her amlodipine  back to 10 mg dose. Will follow up in one month. Will repeat BMP this visit.   Asymmetric edema of both lower extremities Pt with 1+ pitting edema of bilateral lower extremities. She does have 2.5 cm size difference between her calves with right>left. It is non-tender but given her chronic immobility and increased  inflammatory state, will get doppler to rule out DVT.   Fortunately, her DVT study came back without any signs of DVT.   Normocytic anemia Pt with hgb that is stable around 9.1. Will repeat CBC with BMP to ensure no further decline in her hemoglobin.   CKD (chronic kidney disease) stage 5, GFR less than 15 ml/min (HCC) Pt with CKDV that is still producing urine with torsemide . She has stable lower extremity edema with no signs of volume overload. Will get BMP to assess electrolyte status. She follows with nephrology. Discussed with pt if she has decrease in urination she can get volume overloaded  and become uremic. Given pt has multiple co-morbidities that are advanced, discussed what she would want in case she needed dialysis. Pt stated she wanted to prolong her life and would pursue dialysis in that scenario. Discussed given her heart condition, she may not be a good candidate for hemodialysis. Will  continue to discuss this with the patient at subsequent visits as she may become less responsive to diuretics over time. Her BUN is quite high but stable and her mentation is normal.    See Encounters Tab for problem based charting.  Patient Discussed with Dr. Starling Eck, MD Tommas Fragmin. [facility] Internal Medicine Residency, PGY-3

## 2023-06-24 NOTE — Assessment & Plan Note (Addendum)
 Pt with CKDV that is still producing urine with torsemide . She has stable lower extremity edema with no signs of volume overload. Will get BMP to assess electrolyte status. She follows with nephrology. Discussed with pt if she has decrease in urination she can get volume overloaded  and become uremic. Given pt has multiple co-morbidities that are advanced, discussed what she would want in case she needed dialysis. Pt stated she wanted to prolong her life and would pursue dialysis in that scenario. Discussed given her heart condition, she may not be a good candidate for hemodialysis. Will continue to discuss this with the patient at subsequent visits as she may become less responsive to diuretics over time. Her BUN is quite high but stable and her mentation is normal.

## 2023-06-24 NOTE — Assessment & Plan Note (Signed)
 Pt with HTN that is uncontrolled. Blood pressure elevated in the clinic. Pt is asymptomatic. Her regimen is amlodipine  2.5 mg, hydralazine  100 mg TID, Jardiance  10 mg every day. Torsemide  60 mg and 40 mg alternating. Previously she was on amlodipine  10 mg but this was changed to few weeks ago. Her creatinine stable at 3.5 one month ago. Will increase her amlodipine  back to 10 mg dose. Will follow up in one month. Will repeat BMP this visit.

## 2023-06-25 ENCOUNTER — Other Ambulatory Visit: Payer: Self-pay | Admitting: Internal Medicine

## 2023-06-25 DIAGNOSIS — N184 Chronic kidney disease, stage 4 (severe): Secondary | ICD-10-CM

## 2023-06-25 LAB — CBC WITH DIFFERENTIAL/PLATELET
Basophils Absolute: 0 10*3/uL (ref 0.0–0.2)
Basos: 1 %
EOS (ABSOLUTE): 0 10*3/uL (ref 0.0–0.4)
Eos: 1 %
Hematocrit: 29 % — ABNORMAL LOW (ref 34.0–46.6)
Hemoglobin: 9.2 g/dL — ABNORMAL LOW (ref 11.1–15.9)
Immature Grans (Abs): 0 10*3/uL (ref 0.0–0.1)
Immature Granulocytes: 0 %
Lymphocytes Absolute: 0.5 10*3/uL — ABNORMAL LOW (ref 0.7–3.1)
Lymphs: 20 %
MCH: 28.7 pg (ref 26.6–33.0)
MCHC: 31.7 g/dL (ref 31.5–35.7)
MCV: 90 fL (ref 79–97)
Monocytes Absolute: 0.4 10*3/uL (ref 0.1–0.9)
Monocytes: 17 %
Neutrophils Absolute: 1.4 10*3/uL (ref 1.4–7.0)
Neutrophils: 61 %
Platelets: 162 10*3/uL (ref 150–450)
RBC: 3.21 x10E6/uL — ABNORMAL LOW (ref 3.77–5.28)
RDW: 13.2 % (ref 11.7–15.4)
WBC: 2.3 10*3/uL — CL (ref 3.4–10.8)

## 2023-06-25 MED ORDER — RENA-VITE PO TABS
1.0000 | ORAL_TABLET | Freq: Every day | ORAL | 0 refills | Status: DC
Start: 2023-06-25 — End: 2023-10-05

## 2023-06-25 MED ORDER — ALBUTEROL SULFATE (2.5 MG/3ML) 0.083% IN NEBU: 2.5000 mg | INHALATION_SOLUTION | Freq: Four times a day (QID) | RESPIRATORY_TRACT | 12 refills | Status: DC | PRN

## 2023-06-25 MED ORDER — VITAMIN D 25 MCG (1000 UNIT) PO TABS
1000.0000 [IU] | ORAL_TABLET | Freq: Every day | ORAL | 11 refills | Status: DC
Start: 2023-06-25 — End: 2023-10-05

## 2023-06-25 MED ORDER — CALCIUM CARBONATE 1500 (600 CA) MG PO TABS
1.0000 | ORAL_TABLET | Freq: Three times a day (TID) | ORAL | 0 refills | Status: DC
Start: 2023-06-25 — End: 2023-10-05

## 2023-06-25 MED ORDER — FEBUXOSTAT 40 MG PO TABS: 20.0000 mg | ORAL_TABLET | Freq: Every day | ORAL | 5 refills | Status: DC

## 2023-06-25 MED ORDER — LIDOCAINE 4 % EX CREA: 1.0000 | TOPICAL_CREAM | Freq: Two times a day (BID) | CUTANEOUS | 0 refills | Status: DC | PRN

## 2023-06-25 MED ORDER — ROSUVASTATIN CALCIUM 40 MG PO TABS: 40.0000 mg | ORAL_TABLET | Freq: Every day | ORAL | 3 refills | Status: DC

## 2023-06-26 LAB — BMP8+ANION GAP
Anion Gap: 21 mmol/L — ABNORMAL HIGH (ref 10.0–18.0)
BUN/Creatinine Ratio: 26 (ref 12–28)
BUN: 95 mg/dL (ref 8–27)
CO2: 18 mmol/L — ABNORMAL LOW (ref 20–29)
Calcium: 8.4 mg/dL — ABNORMAL LOW (ref 8.7–10.3)
Chloride: 105 mmol/L (ref 96–106)
Creatinine, Ser: 3.66 mg/dL — ABNORMAL HIGH (ref 0.57–1.00)
Glucose: 74 mg/dL (ref 70–99)
Potassium: 4.3 mmol/L (ref 3.5–5.2)
Sodium: 144 mmol/L (ref 134–144)
eGFR: 12 mL/min/{1.73_m2} — ABNORMAL LOW (ref >59)

## 2023-06-27 NOTE — Progress Notes (Signed)
 Internal Medicine Clinic Attending  Case discussed with the resident at the time of the visit.  We reviewed the resident's history and exam and pertinent patient test results.  I agree with the assessment, diagnosis, and plan of care documented in the resident's note.

## 2023-07-09 NOTE — Progress Notes (Signed)
 [facility]    PCP: Jackolyn Masker, MD Cardiology: Dr. Arlester Ladd HF Cardiology: Dr. Mitzie Anda  78 y.o. with history of aortic stenosis, chronic diastolic CHF, and CAD was referred by Dr. Arlester Ladd for evaluation of CHF.  Based on 12/21 echo and RHC/LHC in 12/21, the patient has moderate aortic stenosis. She had coronary angiography in 4/15 with rather diffuse moderate-severe coronary disease that was managed medically.  Cardiac MRI in 11/19 was concerning for cardiac amyloidosis. However, PYP scan was negative in 11/19.  Workup at that time also did not show a monoclonal protein to suggest AL amyloidosis.    Echo in 11/22 showed EF 60-65%, severe LVH, normal RV size and systolic function, mild MR, paradoxical low flow/low gradient severe aortic stenosis with mean gradient 24 mmHg and AVA 0.88 cm^2, small to moderate pericardial effusion.  PYP scan was repeated in 11/22, grade 1 with H/CL 1.07.  This is unlikely to be TTR cardiac amyloidosis.  Repeat urine immunofixation and myeloma panel were negative as well.  [employer] gene testing negative for hATTR mutation.   She was evaluated by hematology for pancytopenia.  This improved without intervention.    Echo in 9/23 showed EF 60-65%, severe concentric LVH, normal RV, PASP 43, moderate pericardial effusion without tamponade, mild-moderate MR, moderate mitral stenosis with mean gradient 7 mmHg, severe low flow/low gradient AS mean gradient 30 with AVA 0.95 cm^2, IVC normal.   Admitted 7/24 with CHF. Echo showed AS severe. Structural Team consulted and felt not TAVR candidate. Diuresed with IV lasix  and GDMT titrated, limited by kidney function. She was seen by PMT and code updated to DNR, with plans for palliative care outpatient.  She was discharged home, weight 171 lbs.  Admitted 1/25 with anemia, hgb 4.6 on admission. Received PRBCs. Cards consulted for peri-op risk stratification. She underwent EGD showing erythematous pyloric regions  (biopsied), otherwise unremarkable. Colonoscopy showed no active sources of bleeding, polyps removed for biopsy. She was discharged home, weight 144 lbs.  Today she returns for HF follow up with her husband. Overall feeling fair. Legs swelling more. More SOB x 1 week, nebulizer machine has helped some with breathing She is WC bound, able to use her upper body for transfers to her bed and restroom. Denies palpitations, abnormal bleeding, CP, dizziness, or PND/Orthopnea. Appetite ok. Taking all medications. No urinating as much as he used to.  ReDs reading: 38 %, abnormal  ECG (personally reviewed): none ordered today  Labs (7/24): K 3.5, creatinine 2.65 Labs (9/24): K 3.7, creatinine 3.49  Labs (10/24): K 3.7, creatinine 2.94 Labs (1/25): K 4.7, creatinine 3.09, hgb 9.1 Labs (2/25): BNP 2975, K 4.4, creatinine 3.19 Labs (3/25): LDL 60 Labs (5/25): K 4.3, creatinine 3.66  PMH: 1. CKD stage 3 2. H/o seizure disorder 3. H/o TIA 4. H/o Mobitz type 1 2nd degree AVB 5. HTN 6. CAD: LHC (4/15) with 80% and 50% tandem stenoses in the proximal LAD, 80% mid LCx, 85% dLCx, diffuse mid-distal disease up to 90% in RCA.  Diffuse coronary disease, treated medically.  7. Aortic stenosis: Suspect moderate AS. - Echo (12/21): AoV mean gradient 32, AVA 1.2 cm^2.  - RHC/LHC (12/21): AoV mean gradient 22, AVA 1.47 cm^2.  - Echo (11/22): EF 60-65%, severe LVH, normal RV size and systolic function, mild MR, paradoxical low flow/low gradient severe aortic stenosis with mean gradient 24 mmHg and AVA 0.88 cm^2, small to moderate pericardial effusion.  - Echo (7/24): severe AS, mean gradient worsened to 50  mmHg. 8. Chronic diastolic CHF:  - Cardiac MRI (16/10): EF 76%, moderate LVH, difficult to null LV myocardium with diffuse subendocardial LGE concerning for possible cardiac amyloidosis, moderate pericardial effusion.  - PYP scan (11/19): grade 0, H/CL 1.08.  - Echo (12/21): EF 60-65%, moderate LVH, normal RV,  moderate pericardial effusion, moderate AS with AoV mean gradient 32, AVA 1.2 cm^2.  - Echo (11/22): EF 60-65%, severe LVH, normal RV size and systolic function, mild MR, paradoxical low flow/low gradient severe aortic stenosis with mean gradient 24 mmHg and AVA 0.88 cm^2, small to moderate pericardial effusion.   - PYP scan (11/22): grade 1 with H/CL 1.07.  This is unlikely to be TTR cardiac amyloidosis. - [employer] gene testing negative for hATTR mutation.  - Echo (9/23): EF 60-65%, severe concentric LVH, normal RV, PASP 43, moderate pericardial effusion without tamponade, mild-moderate MR, moderate mitral stenosis with mean gradient 7 mmHg, severe low flow/low gradient AS mean gradient 30 with AVA 0.95 cm^2, IVC normal.  - Echo (7/24): EF 60-65%, severe LVH, normal RV, severe MAC, severe AS AVA 0.62 cm, AoV mean gradient 50 mmHg  9. Arthritis: Severe, limited to wheelchair.  10. Hyperlipidemia  Social History   Socioeconomic History   Marital status: Married    Spouse name: Johnny   Number of children: 2   Years of education: 4   Highest education level: Not on file  Occupational History   Occupation: retired  Tobacco Use   Smoking status: Former    Current packs/day: 0.00    Average packs/day: 0.5 packs/day for 10.0 years (5.0 ttl pk-yrs)    Types: Cigarettes    Start date: 06/04/1990    Quit date: 06/03/2000    Years since quitting: 23.1   Smokeless tobacco: Never  Vaping Use   Vaping status: Never Used  Substance and Sexual Activity   Alcohol use: No    Alcohol/week: 0.0 standard drinks of alcohol   Drug use: No   Sexual activity: Not Currently    Partners: Male  Other Topics Concern   Not on file  Social History Narrative   Current Social History 06/20/2020        Patient lives with spouse in a home which is 1 story. There are not steps up to the entrance, the patient uses a ramp.       Patient's method of transportation is [employer].      The highest level of education was  some high school.      The patient currently disabled.      Identified important Relationships are "God and my husband"       Pets : None       Interests / Fun: "Sitting on my front porch"       Current Stressors: None        Son passed away.      Social Drivers of Corporate investment banker Strain: Low Risk  (09/18/2022)   Overall Financial Resource Strain (CARDIA)    Difficulty of Paying Living Expenses: Not hard at all  Food Insecurity: No Food Insecurity (03/05/2023)   Hunger Vital Sign    Worried About Running Out of Food in the Last Year: Never true    Ran Out of Food in the Last Year: Never true  Transportation Needs: Unmet Transportation Needs (05/26/2023)   PRAPARE - Administrator, Civil Service (Medical): Yes    Lack of Transportation (Non-Medical): No  Physical Activity: Inactive (09/18/2022)  Exercise Vital Sign    Days of Exercise per Week: 0 days    Minutes of Exercise per Session: 0 min  Stress: No Stress Concern Present (09/18/2022)   [employer] of Occupational Health - Occupational Stress Questionnaire    Feeling of Stress : Not at all  Social Connections: Moderately Isolated (02/25/2023)   Social Connection and Isolation Panel [NHANES]    Frequency of Communication with Friends and Family: More than three times a week    Frequency of Social Gatherings with Friends and Family: Twice a week    Attends Religious Services: Never    Database administrator or Organizations: No    Attends Banker Meetings: Never    Marital Status: Married  Catering manager Violence: Not At Risk (03/05/2023)   Humiliation, Afraid, Rape, and Kick questionnaire    Fear of Current or Ex-Partner: No    Emotionally Abused: No    Physically Abused: No    Sexually Abused: No   Family History  Problem Relation Age of Onset   Hypertension Other    Heart disease Mother    Hypertension Mother    Cancer Father    Obesity Son    Asthma Sister    Heart  disease Sister    Heart disease Sister    ROS: All systems reviewed and negative except as per HPI.   Current Outpatient Medications  Medication Sig Dispense Refill   acetaminophen  (TYLENOL ) 325 MG tablet Take 2 tablets (650 mg total) by mouth every 6 (six) hours as needed for mild pain (or Fever >/= 101). 100 tablet 0   albuterol  (PROVENTIL ) (2.5 MG/3ML) 0.083% nebulizer solution Take 3 mLs (2.5 mg total) by nebulization every 6 (six) hours as needed for wheezing. 90 mL 12   amLODipine  (NORVASC ) 10 MG tablet Take 1 tablet (10 mg total) by mouth daily. 90 tablet 3   aspirin  81 MG chewable tablet Chew 1 tablet (81 mg total) by mouth daily.     calcium  carbonate (OSCAL) 1500 (600 Ca) MG TABS tablet Take 1 tablet (1,500 mg total) by mouth 3 (three) times daily with meals. 90 tablet 0   cholecalciferol  (VITAMIN D3) 25 MCG (1000 UNIT) tablet Take 1 tablet (1,000 Units total) by mouth daily. 30 tablet 11   empagliflozin  (JARDIANCE ) 10 MG TABS tablet Take 1 tablet (10 mg total) by mouth daily before breakfast. 90 tablet 3   febuxostat  (ULORIC ) 40 MG tablet Take 0.5 tablets (20 mg total) by mouth daily. 30 tablet 5   hydrALAZINE  (APRESOLINE ) 100 MG tablet TAKE 1 TABLET(100 MG) BY MOUTH THREE TIMES DAILY 90 tablet 11   lidocaine  (LMX) 4 % cream Apply 1 Application topically 2 (two) times daily as needed (Pain on sacral wound area). 30 g 0   multivitamin (RENA-VIT) TABS tablet Take 1 tablet by mouth at bedtime. 100 tablet 0   pantoprazole  (PROTONIX ) 40 MG tablet TAKE 1 TABLET BY MOUTH DAILY 100 tablet 2   PHENobarbital  (LUMINAL) 64.8 MG tablet Take 2 tablets (129.6 mg total) by mouth at bedtime. 200 tablet 3   rosuvastatin  (CRESTOR ) 40 MG tablet Take 1 tablet (40 mg total) by mouth daily. 90 tablet 3   sevelamer  carbonate (RENVELA ) 800 MG tablet Take 1 tablet (800 mg total) by mouth 3 (three) times daily with meals. 90 tablet 11   torsemide  (DEMADEX ) 20 MG tablet Take 60 mg ( 3 Tabs) daily, Alternating  with 40 mg ( 2 Tabs) daily 200 tablet 3  zonisamide  (ZONEGRAN ) 100 MG capsule TAKE 1 CAPSULE BY MOUTH AT NIGHT 100 capsule 3   No current facility-administered medications for this encounter.   Wt Readings from Last 3 Encounters:  07/11/23 72.7 kg (160 lb 3.2 oz)  06/24/23 68.6 kg (151 lb 3.2 oz)  04/24/23 64.7 kg (142 lb 9.6 oz)   BP (!) 190/80   Pulse 78   Wt 72.7 kg (160 lb 3.2 oz)   SpO2 97%   BMI 30.27 kg/m   PHYSICAL EXAM: General:  NAD. No resp difficulty, arrived in [facility], elderly HEENT: Normal Neck: Supple. JVP 10 Cor: Regular rate & rhythm. No rubs, gallops, 3/6 SEM, faint S2 Lungs: Clear, diminished in bases Abdomen: Soft, nontender, nondistended.  Extremities: No cyanosis, clubbing, rash, 1-2+ RLE edema Neuro: Alert & oriented x 3, moves all 4 extremities w/o difficulty. Affect pleasant.  Assessment/Plan 1. Aortic stenosis: 12/21 LHC/RHC and echo were suggestive of moderate aortic stenosis.  Repeat echo in 11/22 showed paradoxical low flow/low gradient severe aortic stenosis, echo in 9/23 was similar.  Echo this admission (7/24) with severe AS, gradient higher.  She has been seen by Dr. Arlester Ladd in the past for TAVR evaluation.  She is very limited in terms of activity, basically wheel-chair bound at baseline. She has a long 1st degree AVB and has episodes of type 1 2nd degree AVB.  She also has CKD stage IV.  TAVR could be done, but high risk for progression to ESRD and need for pacemaker.  She would have a hard time with HD due to lack of mobility.   - Evaluated by structural heart team, determined not a TAVR candidate.  2. CAD: Moderate-severe diffuse CAD on coronary angiography in 4/15. She would not be a candidate for CABG due to lack of mobility. No chest pain. - Continue ASA 81 - Continue Crestor  40 mg daily. Good lipids 3/25  3. CKD stage IV/V: Baseline creatinine >3. Followed by Dr. Christianne Cowper  - Continue Jardiance  10 mg daily. Recent labs reviewed 4. Chronic diastolic  CHF: Cardiac MRI was concerning for cardiac amyloidosis (11/19), as was echo with moderate LVH and aortic stenosis. The aortic stenosis and the pericardial effusion seen by echo are also frequently seen with cardiac amyloidosis. She additionally has tingling/numbness in hands/feet that is consistent with peripheral neuropathy.  However, PYP scan in 11/19 was negative and myeloma workup was negative.  Repeat PYP scan in 11/22 was again negative and she did not have a transthyretin gene mutation on [employer] gene testing.  Myeloma workup was again negative in 10/22.  Therefore, cardiac amyloidosis seems unlikely and LVH may be due to HTN and aortic stenosis though delayed enhancement from MRI is more suggestive of infiltrative disease. Most recent echo showed EF 60-65%, severe LVH, normal RV, small to moderate pericardial effusion (unchanged), severe AS with mean gradient 50 mmHg (AS has been severe in past but mean gradient has progressed). NYHA III chronically, functional class confounded by debility from severe arthritis. She is volume overloaded on exam today, weight is up (she was able to stand for weight today) and ReDs 38% - Increase torsemide  to 100 mg daily. Recent labs reviewed, repeat BMET/BNP in 10-14 days.  - Restart KCL 20 daily. Recent labs reviewed, K 4.3, SCr 3.66 - Continue Jardiance  10 mg daily.   - Continue compression hose. - No ARB/ARNi or spiro with CKD IV. 5. HTN: BP uncontrolled - Start clonidine 0.1 mg bid. Can increase as needed. Discussed with Dr. Mitzie Anda -  Continue hydralazine  100 mg tid. - Continue amlodipine  10 mg daily.   6. Conduction abnormality: Long 1st degree AVB, also runs of type 1 2nd degree AVB have been noted.   - Avoid beta blockade.  7. Debility: 2/2 severe arthritis. Minimally mobile. This is her baseline.  - No change.  Follow up in 6 weeks with APP (fluid and BP check) and 4 months with Dr Mitzie Anda.  Arlice Bene Atherton, FNP-BC 07/11/23

## 2023-07-10 ENCOUNTER — Telehealth (HOSPITAL_COMMUNITY): Payer: Self-pay

## 2023-07-10 NOTE — Telephone Encounter (Signed)
 Called to confirm/remind patient of their appointment at the [facility] on 07/11/23.   Appointment:   [x] Confirmed  [] Left mess   [] No answer/No voice mail  [] VM Full/unable to leave message  [] Phone not in service  Patient reminded to bring all medications and/or complete list.  Confirmed patient has transportation. Gave directions, instructed to utilize valet parking.

## 2023-07-11 ENCOUNTER — Ambulatory Visit (HOSPITAL_COMMUNITY)
Admission: RE | Admit: 2023-07-11 | Discharge: 2023-07-11 | Disposition: A | Discharge status: Home / Self Care | Source: Ambulatory Visit | Attending: Family Medicine | Admitting: Family Medicine

## 2023-07-11 ENCOUNTER — Encounter (HOSPITAL_COMMUNITY): Payer: Self-pay

## 2023-07-11 VITALS — BP 190/80 | HR 78 | Wt 160.2 lb

## 2023-07-11 DIAGNOSIS — M7989 Other specified soft tissue disorders: Secondary | ICD-10-CM | POA: Diagnosis not present

## 2023-07-11 DIAGNOSIS — I5032 Chronic diastolic (congestive) heart failure: Secondary | ICD-10-CM | POA: Insufficient documentation

## 2023-07-11 DIAGNOSIS — Z7984 Long term (current) use of oral hypoglycemic drugs: Secondary | ICD-10-CM | POA: Insufficient documentation

## 2023-07-11 DIAGNOSIS — I130 Hypertensive heart and chronic kidney disease with heart failure and stage 1 through stage 4 chronic kidney disease, or unspecified chronic kidney disease: Secondary | ICD-10-CM | POA: Insufficient documentation

## 2023-07-11 DIAGNOSIS — R5381 Other malaise: Secondary | ICD-10-CM | POA: Diagnosis not present

## 2023-07-11 DIAGNOSIS — I2510 Atherosclerotic heart disease of native coronary artery without angina pectoris: Secondary | ICD-10-CM | POA: Insufficient documentation

## 2023-07-11 DIAGNOSIS — Z79899 Other long term (current) drug therapy: Secondary | ICD-10-CM | POA: Diagnosis not present

## 2023-07-11 DIAGNOSIS — I5031 Acute diastolic (congestive) heart failure: Secondary | ICD-10-CM | POA: Diagnosis not present

## 2023-07-11 DIAGNOSIS — N184 Chronic kidney disease, stage 4 (severe): Secondary | ICD-10-CM | POA: Insufficient documentation

## 2023-07-11 DIAGNOSIS — Z87891 Personal history of nicotine dependence: Secondary | ICD-10-CM | POA: Diagnosis not present

## 2023-07-11 DIAGNOSIS — I440 Atrioventricular block, first degree: Secondary | ICD-10-CM | POA: Insufficient documentation

## 2023-07-11 DIAGNOSIS — M1990 Unspecified osteoarthritis, unspecified site: Secondary | ICD-10-CM | POA: Insufficient documentation

## 2023-07-11 DIAGNOSIS — I10 Essential (primary) hypertension: Secondary | ICD-10-CM

## 2023-07-11 DIAGNOSIS — Z993 Dependence on wheelchair: Secondary | ICD-10-CM | POA: Diagnosis not present

## 2023-07-11 DIAGNOSIS — I350 Nonrheumatic aortic (valve) stenosis: Secondary | ICD-10-CM | POA: Diagnosis not present

## 2023-07-11 MED ORDER — CLONIDINE HCL 0.1 MG PO TABS: 0.1000 mg | ORAL_TABLET | Freq: Two times a day (BID) | ORAL | 11 refills | Status: DC

## 2023-07-11 MED ORDER — TORSEMIDE 20 MG PO TABS: 100.0000 mg | ORAL_TABLET | Freq: Every day | ORAL | 8 refills | Status: DC

## 2023-07-11 MED ORDER — POTASSIUM CHLORIDE CRYS ER 20 MEQ PO TBCR: 20.0000 meq | EXTENDED_RELEASE_TABLET | Freq: Every day | ORAL | 3 refills | Status: DC

## 2023-07-11 NOTE — Patient Instructions (Addendum)
 Thank you for coming in today  If you had labs drawn today, any labs that are abnormal the clinic will call you No news is good news  Medications: Start Clonidine 0.1 mg 1 tablet twice daily Start Potassium 20 meq 1 tablet daily Increase Torsemide  to 100 mg daily 5 tablets  Follow up appointments: Your physician recommends that you return for lab work in: 10-14 days bmet bnp  Your physician recommends that you schedule a follow-up appointment in:  6 weeks in clinic  4 months With Dr. Mitzie Anda Please call our office to schedule the follow-up appointment in July for September 2025   Do the following things EVERYDAY: Weigh yourself in the morning before breakfast. Write it down and keep it in a log. Take your medicines as prescribed Eat low salt foods--Limit salt (sodium) to 2000 mg per day.  Stay as active as you can everyday Limit all fluids for the day to less than 2 liters   At the [facility], you and your health needs are our priority. As part of our continuing mission to provide you with exceptional heart care, we have created designated Provider Care Teams. These Care Teams include your primary Cardiologist (physician) and Advanced Practice Providers (APPs- Physician Assistants and Nurse Practitioners) who all work together to provide you with the care you need, when you need it.   You may see any of the following providers on your designated Care Team at your next follow up: Dr Jules Oar Dr Peder Bourdon Dr. Mimi Alt, NP Ruddy Corral, [place] [facility] [place], [place] Dennise Fitz, NP Luster Salters, PharmD   Please be sure to bring in all your medications bottles to every appointment.    Thank you for choosing [facility]  If you have any questions or concerns before your next appointment please send us  a message through [place] or call our office at [phone].    TO LEAVE A  MESSAGE FOR THE NURSE SELECT OPTION 2, PLEASE LEAVE A MESSAGE INCLUDING: YOUR NAME DATE OF BIRTH CALL BACK NUMBER REASON FOR CALL**this is important as we prioritize the call backs  YOU WILL RECEIVE A CALL BACK THE SAME DAY AS LONG AS YOU CALL BEFORE 4:00 PM

## 2023-07-11 NOTE — Progress Notes (Signed)
 ReDS Vest / Clip - 07/11/23 1120       ReDS Vest / Clip   Station Marker A    Ruler Value 29    ReDS Value Range Moderate volume overload    ReDS Actual Value 38    Anatomical Comments sitting

## 2023-07-19 ENCOUNTER — Other Ambulatory Visit: Payer: Self-pay | Admitting: Internal Medicine

## 2023-07-19 DIAGNOSIS — K219 Gastro-esophageal reflux disease without esophagitis: Secondary | ICD-10-CM

## 2023-07-21 NOTE — Telephone Encounter (Signed)
 Medication sent to pharmacy

## 2023-07-25 ENCOUNTER — Ambulatory Visit (HOSPITAL_COMMUNITY): Payer: Self-pay | Admitting: Family Medicine

## 2023-07-25 ENCOUNTER — Ambulatory Visit (HOSPITAL_COMMUNITY)
Admission: RE | Admit: 2023-07-25 | Discharge: 2023-07-25 | Disposition: A | Discharge status: Home / Self Care | Source: Ambulatory Visit | Attending: Cardiology | Admitting: Cardiology

## 2023-07-25 DIAGNOSIS — I5032 Chronic diastolic (congestive) heart failure: Secondary | ICD-10-CM | POA: Insufficient documentation

## 2023-07-25 LAB — BASIC METABOLIC PANEL WITH GFR
Anion gap: 19 — ABNORMAL HIGH (ref 5–15)
BUN: 121 mg/dL — ABNORMAL HIGH (ref 8–23)
CO2: 20 mmol/L — ABNORMAL LOW (ref 22–32)
Calcium: 7.8 mg/dL — ABNORMAL LOW (ref 8.9–10.3)
Chloride: 101 mmol/L (ref 98–111)
Creatinine, Ser: 4.67 mg/dL — ABNORMAL HIGH (ref 0.44–1.00)
GFR, Estimated: 9 mL/min — ABNORMAL LOW (ref >60)
Glucose, Bld: 83 mg/dL (ref 70–99)
Potassium: 3.3 mmol/L — ABNORMAL LOW (ref 3.5–5.1)
Sodium: 140 mmol/L (ref 135–145)

## 2023-07-25 LAB — BRAIN NATRIURETIC PEPTIDE: B Natriuretic Peptide: 2003.5 pg/mL — ABNORMAL HIGH (ref 0.0–100.0)

## 2023-07-25 MED ORDER — TORSEMIDE 20 MG PO TABS: ORAL_TABLET | ORAL | Status: DC

## 2023-07-30 DIAGNOSIS — D61818 Other pancytopenia: Secondary | ICD-10-CM | POA: Diagnosis not present

## 2023-07-30 DIAGNOSIS — N185 Chronic kidney disease, stage 5: Secondary | ICD-10-CM | POA: Diagnosis not present

## 2023-07-30 DIAGNOSIS — Z993 Dependence on wheelchair: Secondary | ICD-10-CM | POA: Diagnosis not present

## 2023-07-30 DIAGNOSIS — I2510 Atherosclerotic heart disease of native coronary artery without angina pectoris: Secondary | ICD-10-CM | POA: Diagnosis not present

## 2023-07-30 DIAGNOSIS — I350 Nonrheumatic aortic (valve) stenosis: Secondary | ICD-10-CM | POA: Diagnosis not present

## 2023-08-01 ENCOUNTER — Ambulatory Visit (HOSPITAL_COMMUNITY): Payer: Self-pay | Admitting: Family Medicine

## 2023-08-01 ENCOUNTER — Ambulatory Visit (HOSPITAL_COMMUNITY)
Admission: RE | Admit: 2023-08-01 | Discharge: 2023-08-01 | Disposition: A | Discharge status: Home / Self Care | Source: Ambulatory Visit | Attending: Cardiology | Admitting: Cardiology

## 2023-08-01 DIAGNOSIS — I5032 Chronic diastolic (congestive) heart failure: Secondary | ICD-10-CM | POA: Insufficient documentation

## 2023-08-01 LAB — BASIC METABOLIC PANEL WITH GFR
Anion gap: 18 — ABNORMAL HIGH (ref 5–15)
BUN: 116 mg/dL — ABNORMAL HIGH (ref 8–23)
CO2: 20 mmol/L — ABNORMAL LOW (ref 22–32)
Calcium: 8 mg/dL — ABNORMAL LOW (ref 8.9–10.3)
Chloride: 103 mmol/L (ref 98–111)
Creatinine, Ser: 4.67 mg/dL — ABNORMAL HIGH (ref 0.44–1.00)
GFR, Estimated: 9 mL/min — ABNORMAL LOW (ref >60)
Glucose, Bld: 91 mg/dL (ref 70–99)
Potassium: 3.5 mmol/L (ref 3.5–5.1)
Sodium: 141 mmol/L (ref 135–145)

## 2023-08-04 ENCOUNTER — Encounter: Payer: Self-pay | Admitting: *Deleted

## 2023-08-05 ENCOUNTER — Encounter: Payer: Self-pay | Admitting: Vascular Surgery

## 2023-08-05 ENCOUNTER — Ambulatory Visit: Attending: Vascular Surgery | Admitting: Vascular Surgery

## 2023-08-05 VITALS — BP 138/62 | HR 65 | Temp 97.9°F | Ht 61.0 in | Wt 160.0 lb

## 2023-08-05 DIAGNOSIS — N185 Chronic kidney disease, stage 5: Secondary | ICD-10-CM | POA: Diagnosis not present

## 2023-08-05 NOTE — Progress Notes (Signed)
 [facility] [place]  ASSESSMENT / PLAN: 78 y.o. female with chronic kidney disease, stage V. She has severe cardiac disease which make her prohibitive general anesthetic candidate.  As such, she cannot undergo a laparoscopic peritoneal dialysis catheter.  Counseled the patient about this, and she is very understanding.  She can follow-up with us  as needed.  CHIEF COMPLAINT: Worsening kidney function  HISTORY OF PRESENT ILLNESS: Sherry [name] is a 78 y.o. female referred to clinic for discussion of peritoneal dialysis catheter placement.  The patient is a chronically unwell woman with multiple comorbidities including severe aortic stenosis; NYHA class III heart failure; chronic kidney disease, stage V; among others.  She has been followed by Dr. Christianne Cowper of [facility] for some time.  She has further deteriorated renal function at the point of needing consideration of dialysis.  In her last evaluation, Dr. Christianne Cowper did not think she would tolerate hemodialysis well because of her severe heart disease.  She is referred to discuss peritoneal dialysis.  I reviewed the need for general anesthetic to accomplish placement of a peritoneal dialysis catheter.  I counseled the patient that general anesthetic would put her under considerable risk of perioperative morbidity and mortality.  She is understanding and does not want to accept this risk.  Past Medical History:  Diagnosis Date   Abdominal pain 02/25/2023   Abscess 10/30/2022   Acute diastolic congestive heart failure (HCC) 08/31/2022   Acute kidney injury superimposed on CKD (HCC) 04/28/2011   AKI (acute kidney injury) (HCC) 01/31/2020   Anemia    Aortic stenosis    Arthritis    Bilateral lower extremity edema    Bilateral lower extremity edema    Blurry vision, bilateral 05/08/2016   Bradycardia    a. 01/2013 - asymptomatic.   Chronic diastolic heart failure (HCC)    Chronic sinus bradycardia 07/02/2012    CKD (chronic kidney disease)    a. baseline CKD stage III   CKD (chronic kidney disease) stage 4, GFR 15-29 ml/min (HCC) 09/17/2022   Coronary artery disease    Electrolyte abnormality 08/30/2022   Epilepsy (HCC) 06/04/2006   EEG 02/19/12  Interpretation:  This is an abnormal EEG demonstrating continuous and generalized slowing of the background in the theta frequency. Irregular rhythm of EKG noted.  Clinical correlation: the generalized slowing is consistent with a mild encephalopathy of nonspecific etiologies for which the differential would include infectious, toxic, metabolic, inflammatory, and vascular etiologies.    GERD (gastroesophageal reflux disease)    Headache(784.0)    Heart block AV second degree 02/15/2013   Heart murmur    Hyperlipidemia    Hypertension    Hypocalcemia 08/27/2022   Patient with chronic hypocalcemia, found to have a critical calcium  of 5.8 on CMP today.  Albumin 2.7, corrected calcium  6.8.  Likely multifactorial due to CKD, vitamin D  deficiency.  This was discussed extensively with the patient and family.  Risks and benefits of inpatient management discussed and family decided to continue with current plan of outpatient management with close follow-up.  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 -Orbits: Grossly unremarkable. -Paranasal sinuses: Imaged portions clear. -Brain: No acute  abnormalities such as hemorrhage, hydrocephalus, acute ischemia, or evidence of a mass lesion.  There is patchy bilateral cerebral white matter T2 and flair signal hyper intensities.  Centimeter or smaller CSF intensity   Melena 02/25/2023    Mobitz (type) I [facility]) atrioventricular block    a. 01/2013 - asymptomatic.   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   TIA (transient ischemic attack) 2014   pt stated she had mini strokes   UTI (urinary tract infection) 03/07/2022   Vitamin D  deficiency 04/22/2014    Past Surgical History:  Procedure Laterality Date   ABDOMINAL HYSTERECTOMY     BIOPSY  02/28/2023   Procedure: BIOPSY;  Surgeon: Evangeline Hilts, MD;  Location[facility] ENDOSCOPY;  Service: Gastroenterology;;   COLONOSCOPY WITH PROPOFOL  N/A 03/02/2023   Procedure: COLONOSCOPY WITH PROPOFOL ;  Surgeon: Genell Ken, MD;  Location: [facility] ENDOSCOPY;  Service: Gastroenterology;  Laterality: N/A;   ESOPHAGOGASTRODUODENOSCOPY (EGD) WITH PROPOFOL  N/A 02/28/2023   Procedure: ESOPHAGOGASTRODUODENOSCOPY (EGD) WITH PROPOFOL ;  Surgeon: Evangeline Hilts, MD;  Location: [facility] ENDOSCOPY;  Service: Gastroenterology;  Laterality: N/A;   LEFT AND RIGHT HEART CATHETERIZATION WITH CORONARY ANGIOGRAM N/A 05/24/2013   Procedure: LEFT AND RIGHT HEART CATHETERIZATION WITH CORONARY ANGIOGRAM;  Surgeon: Mickiel Albany, MD;  Location: [facility] CATH LAB;  Service: Cardiovascular;  Laterality: N/A;   LEFT HEART CATH N/A 02/01/2020   Procedure: Left Heart Cath;  Surgeon: Mardell Shade, MD;  Location: [facility] INVASIVE CV LAB;  Service: Cardiovascular;  Laterality: N/A;   POLYPECTOMY  03/02/2023   Procedure: POLYPECTOMY;  Surgeon: Genell Ken, MD;  Location: [facility] ENDOSCOPY;  Service: Gastroenterology;;   RIGHT HEART CATH N/A 02/01/2020   Procedure: RIGHT HEART CATH;  Surgeon: Mardell Shade, MD;  Location: [facility] INVASIVE CV LAB;   Service: Cardiovascular;  Laterality: N/A;    Family History  Problem Relation Age of Onset   Hypertension Other    Heart disease Mother    Hypertension Mother    Cancer Father    Obesity Son    Asthma Sister    Heart disease Sister    Heart disease Sister     Social History   Socioeconomic History   Marital status: Married    Spouse name: Johnny   Number of children: 2   Years of education: 4   Highest education level: Not on file  Occupational History   Occupation: retired  Tobacco Use   Smoking status: Former    Current packs/day: 0.00    Average packs/day: 0.5 packs/day for 10.0 years (5.0 ttl pk-yrs)    Types: Cigarettes    Start date: 06/04/1990    Quit date: 06/03/2000    Years since quitting: 23.1   Smokeless tobacco: Never  Vaping Use   Vaping status: Never Used  Substance and Sexual Activity   Alcohol use: No    Alcohol/week: 0.0 standard drinks of alcohol   Drug use: No   Sexual activity: Not Currently    Partners: Male  Other Topics Concern   Not on file  Social History Narrative   Current Social History 06/20/2020        Patient lives with spouse  in a home which is 1 story. There are not steps up to the entrance, the patient uses a ramp.       Patient's method of transportation is [employer].      The highest level of education was some high school.      The patient currently disabled.      Identified important Relationships are God and my husband       Pets : None       Interests / Fun: Sitting on my front porch       Current Stressors: None        Son passed away.      Social Drivers of Corporate investment banker Strain: Low Risk  (09/18/2022)   Overall Financial Resource Strain (CARDIA)    Difficulty of Paying Living Expenses: Not hard at all  Food Insecurity: No Food Insecurity (03/05/2023)   Hunger Vital Sign    Worried About Running Out of Food in the Last Year: Never true    Ran Out of Food in the Last Year: Never true   Transportation Needs: Unmet Transportation Needs (05/26/2023)   PRAPARE - Administrator, Civil Service (Medical): Yes    Lack of Transportation (Non-Medical): No  Physical Activity: Inactive (09/18/2022)   Exercise Vital Sign    Days of Exercise per Week: 0 days    Minutes of Exercise per Session: 0 min  Stress: No Stress Concern Present (09/18/2022)   [employer] of Occupational Health - Occupational Stress Questionnaire    Feeling of Stress : Not at all  Social Connections: Moderately Isolated (02/25/2023)   Social Connection and Isolation Panel    Frequency of Communication with Friends and Family: More than three times a week    Frequency of Social Gatherings with Friends and Family: Twice a week    Attends Religious Services: Never    Database administrator or Organizations: No    Attends Banker Meetings: Never    Marital Status: Married  Catering manager Violence: Not At Risk (03/05/2023)   Humiliation, Afraid, Rape, and Kick questionnaire    Fear of Current or Ex-Partner: No    Emotionally Abused: No    Physically Abused: No    Sexually Abused: No    Allergies  Allergen Reactions   Ibuprofen Other (See Comments)    Dr advised pt not to take ibuprofen   Lactose Intolerance (Gi) Diarrhea    Current Outpatient Medications  Medication Sig Dispense Refill   acetaminophen  (TYLENOL ) 325 MG tablet Take 2 tablets (650 mg total) by mouth every 6 (six) hours as needed for mild pain (or Fever >/= 101). 100 tablet 0   albuterol  (PROVENTIL ) (2.5 MG/3ML) 0.083% nebulizer solution Take 3 mLs (2.5 mg total) by nebulization every 6 (six) hours as needed for wheezing. 90 mL 12   amLODipine  (NORVASC ) 10 MG tablet Take 1 tablet (10 mg total) by mouth daily. 90 tablet 3   aspirin  81 MG chewable tablet Chew 1 tablet (81 mg total) by mouth daily.     calcium  carbonate (OSCAL) 1500 (600 Ca) MG TABS tablet Take 1 tablet (1,500 mg total) by mouth 3 (three) times daily  with meals. 90 tablet 0   cholecalciferol  (VITAMIN D3) 25 MCG (1000 UNIT) tablet Take 1 tablet (1,000 Units total) by mouth daily. 30 tablet 11   cloNIDine  (CATAPRES ) 0.1 MG tablet Take 1 tablet (0.1 mg total) by mouth 2 (two) times daily. 60 tablet  11   empagliflozin  (JARDIANCE ) 10 MG TABS tablet Take 1 tablet (10 mg total) by mouth daily before breakfast. 90 tablet 3   febuxostat  (ULORIC ) 40 MG tablet Take 0.5 tablets (20 mg total) by mouth daily. 30 tablet 5   hydrALAZINE  (APRESOLINE ) 100 MG tablet TAKE 1 TABLET(100 MG) BY MOUTH THREE TIMES DAILY 90 tablet 11   lidocaine  (LMX) 4 % cream Apply 1 Application topically 2 (two) times daily as needed (Pain on sacral wound area). 30 g 0   multivitamin (RENA-VIT) TABS tablet Take 1 tablet by mouth at bedtime. 100 tablet 0   pantoprazole  (PROTONIX ) 40 MG tablet TAKE 1 TABLET BY MOUTH DAILY 100 tablet 2   PHENobarbital  (LUMINAL) 64.8 MG tablet Take 2 tablets (129.6 mg total) by mouth at bedtime. 200 tablet 3   potassium chloride  SA (KLOR-CON  M) 20 MEQ tablet Take 1 tablet (20 mEq total) by mouth daily. 90 tablet 3   rosuvastatin  (CRESTOR ) 40 MG tablet Take 1 tablet (40 mg total) by mouth daily. 90 tablet 3   sevelamer  carbonate (RENVELA ) 800 MG tablet Take 1 tablet (800 mg total) by mouth 3 (three) times daily with meals. 90 tablet 11   torsemide  (DEMADEX ) 20 MG tablet Take 80 mg daily every other day ALTERNATING with 60 mg daily every other day     zonisamide  (ZONEGRAN ) 100 MG capsule TAKE 1 CAPSULE BY MOUTH AT NIGHT 100 capsule 3   No current facility-administered medications for this visit.    PHYSICAL EXAM Vitals:   08/05/23 1341  BP: 138/62  Pulse: 65  Temp: 97.9 F (36.6 C)  SpO2: 95%  Weight: 160 lb (72.6 kg)  Height: 5' 1 (1.549 m)   Chronically ill woman.  In a wheelchair.  No acute distress Regular rate and rhythm Unlabored breathing Soft nontender abdomen  PERTINENT LABORATORY AND RADIOLOGIC DATA  Most recent CBC     Latest Ref Rng & Units 06/24/2023   11:43 AM 05/12/2023   11:33 AM 03/20/2023   10:43 AM  CBC  WBC 3.4 - 10.8 x10E3/uL 2.3  2.1  3.1   Hemoglobin 11.1 - 15.9 g/dL 9.2  9.1  9.1   Hematocrit 34.0 - 46.6 % 29.0  29.9  29.6   Platelets 150 - 450 x10E3/uL 162  168  286      Most recent CMP    Latest Ref Rng & Units 08/01/2023    9:34 AM 07/25/2023   11:23 AM 06/24/2023   11:43 AM  CMP  Glucose 70 - 99 mg/dL 91  83  74   BUN 8 - 23 mg/dL 324  401  95   Creatinine 0.44 - 1.00 mg/dL 0.27  2.53  6.64   Sodium 135 - 145 mmol/L 141  140  144   Potassium 3.5 - 5.1 mmol/L 3.5  3.3  4.3   Chloride 98 - 111 mmol/L 103  101  105   CO2 22 - 32 mmol/L 20  20  18    Calcium  8.9 - 10.3 mg/dL 8.0  7.8  8.4     Renal function Estimated Creatinine Clearance: 9.2 mL/min (A) (by C-G formula based on SCr of 4.67 mg/dL (H)).  Hemoglobin A1C (no units)  Date Value  01/25/2013 5.8    LDL Chol Calc (NIH)  Date Value Ref Range Status  01/26/2021 100 (H) 0 - 99 mg/dL Final   LDL Cholesterol  Date Value Ref Range Status  05/12/2023 60 0 - 99 mg/dL Final  Comment:           Total Cholesterol/HDL:CHD Risk Coronary Heart Disease Risk Table                     Men   Women  1/2 Average Risk   3.4   3.3  Average Risk       5.0   4.4  2 X Average Risk   9.6   7.1  3 X Average Risk  23.4   11.0        Use the calculated Patient Ratio above and the CHD Risk Table to determine the patient's CHD Risk.        ATP III CLASSIFICATION (LDL):  <100     mg/dL   Optimal  098-119  mg/dL   Near or Above                    Optimal  130-159  mg/dL   Borderline  147-829  mg/dL   High  >562     mg/dL   Very High Performed at [facility], [address], [place], [place] [address]     Heber Little. Edgardo Goodwill, MD [facility] Phone Number: [phone] 08/05/2023 4:11 PM   Total time spent on preparing this encounter including chart review, data review, collecting  history, examining the patient, and coordinating care: 45 minutes  Portions of this report may have been transcribed using voice recognition software.  Every effort has been made to ensure accuracy; however, inadvertent computerized transcription errors may still be present.

## 2023-08-18 NOTE — Progress Notes (Signed)
 [facility]    PCP: Myrna Bitters, DO Cardiology: Dr. Wonda HF Cardiology: Dr. Rolan  78 y.o. with history of aortic stenosis, chronic diastolic CHF, and CAD was referred by Dr. Wonda for evaluation of CHF.  Based on 12/21 echo and RHC/LHC in 12/21, the patient has moderate aortic stenosis. She had coronary angiography in 4/15 with rather diffuse moderate-severe coronary disease that was managed medically.  Cardiac MRI in 11/19 was concerning for cardiac amyloidosis. However, PYP scan was negative in 11/19.  Workup at that time also did not show a monoclonal protein to suggest AL amyloidosis.    Echo in 11/22 showed EF 60-65%, severe LVH, normal RV size and systolic function, mild MR, paradoxical low flow/low gradient severe aortic stenosis with mean gradient 24 mmHg and AVA 0.88 cm^2, small to moderate pericardial effusion.  PYP scan was repeated in 11/22, grade 1 with H/CL 1.07.  This is unlikely to be TTR cardiac amyloidosis.  Repeat urine immunofixation and myeloma panel were negative as well.  [employer] gene testing negative for hATTR mutation.   She was evaluated by hematology for pancytopenia.  This improved without intervention.    Echo in 9/23 showed EF 60-65%, severe concentric LVH, normal RV, PASP 43, moderate pericardial effusion without tamponade, mild-moderate MR, moderate mitral stenosis with mean gradient 7 mmHg, severe low flow/low gradient AS mean gradient 30 with AVA 0.95 cm^2, IVC normal.   Admitted 7/24 with CHF. Echo showed AS severe. Structural Team consulted and felt not TAVR candidate. Diuresed with IV lasix  and GDMT titrated, limited by kidney function. She was seen by PMT and code updated to DNR, with plans for palliative care outpatient.  She was discharged home, weight 171 lbs.  Admitted 1/25 with anemia, hgb 4.6 on admission. Received PRBCs. Cards consulted for peri-op risk stratification. She underwent EGD showing erythematous pyloric regions  (biopsied), otherwise unremarkable. Colonoscopy showed no active sources of bleeding, polyps removed for biopsy. She was discharged home, weight 144 lbs.  Today she returns for HF follow up with her husband. They brought BP log today and home BP 120-140s/70s. Overall feeling fine. She is not SOB transferring out of her wheelchair if she takes her time. She is mostly WC-bound due to generalized OA. Her legs are swelling, she wears compression hose. She urinates but not briskly.  Denies palpitations, abnormal bleeding, CP, dizziness, or PND/Orthopnea. Taking all medications, husband helps care for her.    ECG (personally reviewed): none ordered today  Labs (7/24): K 3.5, creatinine 2.65 Labs (9/24): K 3.7, creatinine 3.49  Labs (10/24): K 3.7, creatinine 2.94 Labs (1/25): K 4.7, creatinine 3.09, hgb 9.1 Labs (2/25): BNP 2975, K 4.4, creatinine 3.19 Labs (3/25): LDL 60 Labs (5/25): K 4.3, creatinine 3.66 Labs (6/25): K 3.5, creatinine 4.67  PMH: 1. CKD stage 3 2. H/o seizure disorder 3. H/o TIA 4. H/o Mobitz type 1 2nd degree AVB 5. HTN 6. CAD: LHC (4/15) with 80% and 50% tandem stenoses in the proximal LAD, 80% mid LCx, 85% dLCx, diffuse mid-distal disease up to 90% in RCA.  Diffuse coronary disease, treated medically.  7. Aortic stenosis: Suspect moderate AS. - Echo (12/21): AoV mean gradient 32, AVA 1.2 cm^2.  - RHC/LHC (12/21): AoV mean gradient 22, AVA 1.47 cm^2.  - Echo (11/22): EF 60-65%, severe LVH, normal RV size and systolic function, mild MR, paradoxical low flow/low gradient severe aortic stenosis with mean gradient 24 mmHg and AVA 0.88 cm^2, small to moderate pericardial effusion.  -  Echo (7/24): severe AS, mean gradient worsened to 50 mmHg. 8. Chronic diastolic CHF:  - Cardiac MRI (88/80): EF 76%, moderate LVH, difficult to null LV myocardium with diffuse subendocardial LGE concerning for possible cardiac amyloidosis, moderate pericardial effusion.  - PYP scan (11/19): grade  0, H/CL 1.08.  - Echo (12/21): EF 60-65%, moderate LVH, normal RV, moderate pericardial effusion, moderate AS with AoV mean gradient 32, AVA 1.2 cm^2.  - Echo (11/22): EF 60-65%, severe LVH, normal RV size and systolic function, mild MR, paradoxical low flow/low gradient severe aortic stenosis with mean gradient 24 mmHg and AVA 0.88 cm^2, small to moderate pericardial effusion.   - PYP scan (11/22): grade 1 with H/CL 1.07.  This is unlikely to be TTR cardiac amyloidosis. - [employer] gene testing negative for hATTR mutation.  - Echo (9/23): EF 60-65%, severe concentric LVH, normal RV, PASP 43, moderate pericardial effusion without tamponade, mild-moderate MR, moderate mitral stenosis with mean gradient 7 mmHg, severe low flow/low gradient AS mean gradient 30 with AVA 0.95 cm^2, IVC normal.  - Echo (7/24): EF 60-65%, severe LVH, normal RV, severe MAC, severe AS AVA 0.62 cm, AoV mean gradient 50 mmHg  9. Arthritis: Severe, limited to wheelchair.  10. Hyperlipidemia  Social History   Socioeconomic History   Marital status: Married    Spouse name: Johnny   Number of children: 2   Years of education: 4   Highest education level: Not on file  Occupational History   Occupation: retired  Tobacco Use   Smoking status: Former    Current packs/day: 0.00    Average packs/day: 0.5 packs/day for 10.0 years (5.0 ttl pk-yrs)    Types: Cigarettes    Start date: 06/04/1990    Quit date: 06/03/2000    Years since quitting: 23.2   Smokeless tobacco: Never  Vaping Use   Vaping status: Never Used  Substance and Sexual Activity   Alcohol use: No    Alcohol/week: 0.0 standard drinks of alcohol   Drug use: No   Sexual activity: Not Currently    Partners: Male  Other Topics Concern   Not on file  Social History Narrative   Current Social History 06/20/2020        Patient lives with spouse in a home which is 1 story. There are not steps up to the entrance, the patient uses a ramp.       Patient's method  of transportation is [employer].      The highest level of education was some high school.      The patient currently disabled.      Identified important Relationships are God and my husband       Pets : None       Interests / Fun: Sitting on my front porch       Current Stressors: None        Son passed away.      Social Drivers of Corporate investment banker Strain: Low Risk  (09/18/2022)   Overall Financial Resource Strain (CARDIA)    Difficulty of Paying Living Expenses: Not hard at all  Food Insecurity: No Food Insecurity (03/05/2023)   Hunger Vital Sign    Worried About Running Out of Food in the Last Year: Never true    Ran Out of Food in the Last Year: Never true  Transportation Needs: Unmet Transportation Needs (05/26/2023)   PRAPARE - Transportation    Lack of Transportation (Medical): Yes    Lack of  Transportation (Non-Medical): No  Physical Activity: Inactive (09/18/2022)   Exercise Vital Sign    Days of Exercise per Week: 0 days    Minutes of Exercise per Session: 0 min  Stress: No Stress Concern Present (09/18/2022)   [employer] of Occupational Health - Occupational Stress Questionnaire    Feeling of Stress : Not at all  Social Connections: Moderately Isolated (02/25/2023)   Social Connection and Isolation Panel    Frequency of Communication with Friends and Family: More than three times a week    Frequency of Social Gatherings with Friends and Family: Twice a week    Attends Religious Services: Never    Database administrator or Organizations: No    Attends Banker Meetings: Never    Marital Status: Married  Catering manager Violence: Not At Risk (03/05/2023)   Humiliation, Afraid, Rape, and Kick questionnaire    Fear of Current or Ex-Partner: No    Emotionally Abused: No    Physically Abused: No    Sexually Abused: No   Family History  Problem Relation Age of Onset   Hypertension Other    Heart disease Mother    Hypertension Mother     Cancer Father    Obesity Son    Asthma Sister    Heart disease Sister    Heart disease Sister    ROS: All systems reviewed and negative except as per HPI.   Current Outpatient Medications  Medication Sig Dispense Refill   acetaminophen  (TYLENOL ) 325 MG tablet Take 2 tablets (650 mg total) by mouth every 6 (six) hours as needed for mild pain (or Fever >/= 101). (Patient taking differently: Take 650 mg by mouth as needed for mild pain (pain score 1-3) (or Fever >/= 101).) 100 tablet 0   albuterol  (PROVENTIL ) (2.5 MG/3ML) 0.083% nebulizer solution Take 3 mLs (2.5 mg total) by nebulization every 6 (six) hours as needed for wheezing. 90 mL 12   amLODipine  (NORVASC ) 10 MG tablet Take 1 tablet (10 mg total) by mouth daily. 90 tablet 3   aspirin  81 MG chewable tablet Chew 1 tablet (81 mg total) by mouth daily.     calcium  carbonate (OSCAL) 1500 (600 Ca) MG TABS tablet Take 1 tablet (1,500 mg total) by mouth 3 (three) times daily with meals. 90 tablet 0   cholecalciferol  (VITAMIN D3) 25 MCG (1000 UNIT) tablet Take 1 tablet (1,000 Units total) by mouth daily. 30 tablet 11   cloNIDine  (CATAPRES ) 0.1 MG tablet Take 1 tablet (0.1 mg total) by mouth 2 (two) times daily. 60 tablet 11   empagliflozin  (JARDIANCE ) 10 MG TABS tablet Take 1 tablet (10 mg total) by mouth daily before breakfast. 90 tablet 3   febuxostat  (ULORIC ) 40 MG tablet Take 0.5 tablets (20 mg total) by mouth daily. 30 tablet 5   hydrALAZINE  (APRESOLINE ) 100 MG tablet TAKE 1 TABLET(100 MG) BY MOUTH THREE TIMES DAILY 90 tablet 11   lidocaine  (LMX) 4 % cream Apply 1 Application topically 2 (two) times daily as needed (Pain on sacral wound area). 30 g 0   multivitamin (RENA-VIT) TABS tablet Take 1 tablet by mouth at bedtime. 100 tablet 0   pantoprazole  (PROTONIX ) 40 MG tablet TAKE 1 TABLET BY MOUTH DAILY 100 tablet 2   PHENobarbital  (LUMINAL) 64.8 MG tablet Take 2 tablets (129.6 mg total) by mouth at bedtime. 200 tablet 3   potassium chloride  SA  (KLOR-CON  M) 20 MEQ tablet Take 1 tablet (20 mEq total) by mouth daily.  90 tablet 3   rosuvastatin  (CRESTOR ) 40 MG tablet Take 1 tablet (40 mg total) by mouth daily. 90 tablet 3   sevelamer  carbonate (RENVELA ) 800 MG tablet Take 1 tablet (800 mg total) by mouth 3 (three) times daily with meals. 90 tablet 11   torsemide  (DEMADEX ) 20 MG tablet Take 80 mg daily every other day ALTERNATING with 60 mg daily every other day     zonisamide  (ZONEGRAN ) 100 MG capsule TAKE 1 CAPSULE BY MOUTH AT NIGHT 100 capsule 3   No current facility-administered medications for this encounter.   Wt Readings from Last 3 Encounters:  08/20/23 59 kg (130 lb)  08/05/23 72.6 kg (160 lb)  07/11/23 72.7 kg (160 lb 3.2 oz)   BP (!) 180/80   Pulse 68   Ht 5' 1 (1.549 m)   Wt 59 kg (130 lb)   SpO2 97%   BMI 24.56 kg/m   PHYSICAL EXAM: General:  NAD. No resp difficulty, arrived in [facility] HEENT: Normal Neck: Supple. No JVD. Cor: Regular rate & rhythm. No rubs, gallops, 3/6 SEM faint S2 Lungs: Clear Abdomen: Soft, nontender, nondistended.  Extremities: No cyanosis, clubbing, rash, 2+ BLE edema to knees Neuro: Alert & oriented x 3, moves all 4 extremities w/o difficulty. Affect pleasant.  Assessment/Plan 1. Aortic stenosis: 12/21 LHC/RHC and echo were suggestive of moderate aortic stenosis.  Repeat echo in 11/22 showed paradoxical low flow/low gradient severe aortic stenosis, echo in 9/23 was similar.  Echo this admission (7/24) with severe AS, gradient higher.  She has been seen by Dr. Wonda in the past for TAVR evaluation.  She is very limited in terms of activity, basically wheel-chair bound at baseline. She has a long 1st degree AVB and has episodes of type 1 2nd degree AVB.  She also has CKD stage IV.  TAVR could be done, but high risk for progression to ESRD and need for pacemaker.  She would have a hard time with HD due to lack of mobility.   - Evaluated by structural heart team, determined not a TAVR candidate.   2. CAD: Moderate-severe diffuse CAD on coronary angiography in 4/15. She would not be a candidate for CABG due to lack of mobility. No chest pain. - Continue ASA 81 - Continue Crestor  40 mg daily. Good lipids 3/25  3. CKD stage IV/V: Baseline creatinine > 3.5. Followed by Dr. Gearline  - Continue Jardiance  10 mg daily. Recent labs reviewed 4. Chronic diastolic CHF: Cardiac MRI was concerning for cardiac amyloidosis (11/19), as was echo with moderate LVH and aortic stenosis. The aortic stenosis and the pericardial effusion seen by echo are also frequently seen with cardiac amyloidosis. She additionally has tingling/numbness in hands/feet that is consistent with peripheral neuropathy.  However, PYP scan in 11/19 was negative and myeloma workup was negative.  Repeat PYP scan in 11/22 was again negative and she did not have a transthyretin gene mutation on [employer] gene testing.  Myeloma workup was again negative in 10/22.  Therefore, cardiac amyloidosis seems unlikely and LVH may be due to HTN and aortic stenosis though delayed enhancement from MRI is more suggestive of infiltrative disease. Most recent echo showed EF 60-65%, severe LVH, normal RV, small to moderate pericardial effusion (unchanged), severe AS with mean gradient 50 mmHg (AS has been severe in past but mean gradient has progressed). NYHA III chronically, functional class confounded by debility from severe arthritis. She has LEE on exam, but does not appear to be volume overloaded. GDMT limited  by worsening CKD. - Arrange for [employer]. - Continue torsemide  80 mg daily alternating with 60 mg every other day. Recent labs reviewed - Continue 20 KCL daily. Recent labs reviewed, K 3.5, SCr 4.67 - Continue Jardiance  10 mg daily.   - Continue compression hose until Unna boots can be placed - No ARB/ARNi or spiro with CKD IV. 5. HTN: BP elevated in clinic today, but better controlled on home checks - Continue clonidine  0.1 mg bid. Can increase as  needed.   - Continue hydralazine  100 mg tid. - Continue amlodipine  10 mg daily.   6. Conduction abnormality: Long 1st degree AVB, also runs of type 1 2nd degree AVB have been noted.   - Avoid beta blockade.  7. Debility: 2/2 severe arthritis. Minimally mobile. This is her baseline.  - No change.  8. GOC: Need on-going GOC conversations with declining renal function and severe AS.  Follow up in 2 months with Dr Rolan.  Harlene HERO [place], FNP-BC 08/20/23

## 2023-08-19 ENCOUNTER — Telehealth (HOSPITAL_COMMUNITY): Payer: Self-pay

## 2023-08-19 NOTE — Telephone Encounter (Signed)
 Called to confirm/remind patient of their appointment at the [facility] on 08/20/23.   Appointment:   [x] Confirmed  [] Left mess   [] No answer/No voice mail  [] VM Full/unable to leave message  [] Phone not in service  Patient reminded to bring all medications and/or complete list.  Confirmed patient has transportation. Gave directions, instructed to utilize valet parking.

## 2023-08-20 ENCOUNTER — Ambulatory Visit (HOSPITAL_COMMUNITY)
Admission: RE | Admit: 2023-08-20 | Discharge: 2023-08-20 | Disposition: A | Discharge status: Home / Self Care | Source: Ambulatory Visit | Attending: Family Medicine | Admitting: Family Medicine

## 2023-08-20 ENCOUNTER — Encounter (HOSPITAL_COMMUNITY): Payer: Self-pay

## 2023-08-20 VITALS — BP 180/80 | HR 68 | Ht 61.0 in | Wt 130.0 lb

## 2023-08-20 DIAGNOSIS — I5032 Chronic diastolic (congestive) heart failure: Secondary | ICD-10-CM | POA: Insufficient documentation

## 2023-08-20 DIAGNOSIS — M7989 Other specified soft tissue disorders: Secondary | ICD-10-CM | POA: Diagnosis not present

## 2023-08-20 DIAGNOSIS — R600 Localized edema: Secondary | ICD-10-CM | POA: Diagnosis not present

## 2023-08-20 DIAGNOSIS — I350 Nonrheumatic aortic (valve) stenosis: Secondary | ICD-10-CM | POA: Diagnosis not present

## 2023-08-20 DIAGNOSIS — N184 Chronic kidney disease, stage 4 (severe): Secondary | ICD-10-CM | POA: Insufficient documentation

## 2023-08-20 DIAGNOSIS — I10 Essential (primary) hypertension: Secondary | ICD-10-CM

## 2023-08-20 DIAGNOSIS — Z7984 Long term (current) use of oral hypoglycemic drugs: Secondary | ICD-10-CM | POA: Diagnosis not present

## 2023-08-20 DIAGNOSIS — I130 Hypertensive heart and chronic kidney disease with heart failure and stage 1 through stage 4 chronic kidney disease, or unspecified chronic kidney disease: Secondary | ICD-10-CM | POA: Diagnosis not present

## 2023-08-20 DIAGNOSIS — I440 Atrioventricular block, first degree: Secondary | ICD-10-CM | POA: Diagnosis not present

## 2023-08-20 DIAGNOSIS — Z87891 Personal history of nicotine dependence: Secondary | ICD-10-CM | POA: Diagnosis not present

## 2023-08-20 DIAGNOSIS — I441 Atrioventricular block, second degree: Secondary | ICD-10-CM | POA: Diagnosis not present

## 2023-08-20 DIAGNOSIS — Z7982 Long term (current) use of aspirin: Secondary | ICD-10-CM | POA: Insufficient documentation

## 2023-08-20 DIAGNOSIS — R5381 Other malaise: Secondary | ICD-10-CM | POA: Insufficient documentation

## 2023-08-20 DIAGNOSIS — N185 Chronic kidney disease, stage 5: Secondary | ICD-10-CM

## 2023-08-20 DIAGNOSIS — I2510 Atherosclerotic heart disease of native coronary artery without angina pectoris: Secondary | ICD-10-CM | POA: Diagnosis not present

## 2023-08-20 NOTE — Patient Instructions (Signed)
 There has been no changes to your medications.  You have been referred to [facility] for [employer]. They will call you to arrange your appointment.  Your physician recommends that you schedule a follow-up appointment in: 2 months.  If you have any questions or concerns before your next appointment please send us  a message through [place] or call our office at [phone].    TO LEAVE A MESSAGE FOR THE NURSE SELECT OPTION 2, PLEASE LEAVE A MESSAGE INCLUDING: YOUR NAME DATE OF BIRTH CALL BACK NUMBER REASON FOR CALL**this is important as we prioritize the call backs  YOU WILL RECEIVE A CALL BACK THE SAME DAY AS LONG AS YOU CALL BEFORE 4:00 PM  At the [facility], you and your health needs are our priority. As part of our continuing mission to provide you with exceptional heart care, we have created designated Provider Care Teams. These Care Teams include your primary Cardiologist (physician) and Advanced Practice Providers (APPs- Physician Assistants and Nurse Practitioners) who all work together to provide you with the care you need, when you need it.   You may see any of the following providers on your designated Care Team at your next follow up: Dr Toribio Fuel Dr Ezra Shuck Dr. Ria Commander Dr. Morene Brownie Amy Lenetta, NP Caffie Shed, [place] [facility] [place], [place] Beckey Coe, NP [place] Lee, NP Ellouise Class, NP Tinnie Redman, PharmD Jaun Bash, PharmD   Please be sure to bring in all your medications bottles to every appointment.    Thank you for choosing [facility]

## 2023-08-20 NOTE — Addendum Note (Signed)
 Encounter addended by: Marcelina Lisa HERO, RN on: 08/20/2023 11:54 AM  Actions taken: Visit diagnoses modified, Order list changed, Diagnosis association updated

## 2023-09-12 DIAGNOSIS — Z993 Dependence on wheelchair: Secondary | ICD-10-CM | POA: Diagnosis not present

## 2023-09-12 DIAGNOSIS — N185 Chronic kidney disease, stage 5: Secondary | ICD-10-CM | POA: Diagnosis not present

## 2023-09-12 DIAGNOSIS — I350 Nonrheumatic aortic (valve) stenosis: Secondary | ICD-10-CM | POA: Diagnosis not present

## 2023-09-12 DIAGNOSIS — N189 Chronic kidney disease, unspecified: Secondary | ICD-10-CM | POA: Diagnosis not present

## 2023-09-12 DIAGNOSIS — N2581 Secondary hyperparathyroidism of renal origin: Secondary | ICD-10-CM | POA: Diagnosis not present

## 2023-09-12 DIAGNOSIS — D631 Anemia in chronic kidney disease: Secondary | ICD-10-CM | POA: Diagnosis not present

## 2023-09-15 ENCOUNTER — Telehealth: Payer: Self-pay | Admitting: *Deleted

## 2023-09-15 ENCOUNTER — Inpatient Hospital Stay (HOSPITAL_COMMUNITY)
Admission: EM | Admit: 2023-09-15 | Discharge: 2023-09-18 | DRG: 291 | Disposition: A | Discharge status: Hospice | Attending: Internal Medicine | Admitting: Internal Medicine

## 2023-09-15 ENCOUNTER — Emergency Department (HOSPITAL_COMMUNITY)

## 2023-09-15 ENCOUNTER — Other Ambulatory Visit: Payer: Self-pay

## 2023-09-15 ENCOUNTER — Encounter (HOSPITAL_COMMUNITY): Payer: Self-pay

## 2023-09-15 DIAGNOSIS — D631 Anemia in chronic kidney disease: Secondary | ICD-10-CM | POA: Diagnosis present

## 2023-09-15 DIAGNOSIS — Z7189 Other specified counseling: Secondary | ICD-10-CM | POA: Diagnosis not present

## 2023-09-15 DIAGNOSIS — K219 Gastro-esophageal reflux disease without esophagitis: Secondary | ICD-10-CM | POA: Diagnosis present

## 2023-09-15 DIAGNOSIS — I5033 Acute on chronic diastolic (congestive) heart failure: Secondary | ICD-10-CM | POA: Diagnosis present

## 2023-09-15 DIAGNOSIS — I5084 End stage heart failure: Secondary | ICD-10-CM | POA: Diagnosis not present

## 2023-09-15 DIAGNOSIS — N185 Chronic kidney disease, stage 5: Secondary | ICD-10-CM | POA: Diagnosis present

## 2023-09-15 DIAGNOSIS — N189 Chronic kidney disease, unspecified: Secondary | ICD-10-CM | POA: Diagnosis not present

## 2023-09-15 DIAGNOSIS — J90 Pleural effusion, not elsewhere classified: Secondary | ICD-10-CM | POA: Diagnosis not present

## 2023-09-15 DIAGNOSIS — R404 Transient alteration of awareness: Secondary | ICD-10-CM | POA: Diagnosis not present

## 2023-09-15 DIAGNOSIS — I2510 Atherosclerotic heart disease of native coronary artery without angina pectoris: Secondary | ICD-10-CM | POA: Diagnosis not present

## 2023-09-15 DIAGNOSIS — G40909 Epilepsy, unspecified, not intractable, without status epilepticus: Secondary | ICD-10-CM | POA: Diagnosis present

## 2023-09-15 DIAGNOSIS — Z8249 Family history of ischemic heart disease and other diseases of the circulatory system: Secondary | ICD-10-CM | POA: Diagnosis not present

## 2023-09-15 DIAGNOSIS — I509 Heart failure, unspecified: Principal | ICD-10-CM

## 2023-09-15 DIAGNOSIS — Z66 Do not resuscitate: Secondary | ICD-10-CM | POA: Diagnosis not present

## 2023-09-15 DIAGNOSIS — Z7984 Long term (current) use of oral hypoglycemic drugs: Secondary | ICD-10-CM | POA: Diagnosis not present

## 2023-09-15 DIAGNOSIS — N19 Unspecified kidney failure: Secondary | ICD-10-CM

## 2023-09-15 DIAGNOSIS — I130 Hypertensive heart and chronic kidney disease with heart failure and stage 1 through stage 4 chronic kidney disease, or unspecified chronic kidney disease: Secondary | ICD-10-CM | POA: Diagnosis not present

## 2023-09-15 DIAGNOSIS — Z8673 Personal history of transient ischemic attack (TIA), and cerebral infarction without residual deficits: Secondary | ICD-10-CM | POA: Diagnosis not present

## 2023-09-15 DIAGNOSIS — E739 Lactose intolerance, unspecified: Secondary | ICD-10-CM | POA: Diagnosis not present

## 2023-09-15 DIAGNOSIS — I493 Ventricular premature depolarization: Secondary | ICD-10-CM | POA: Diagnosis present

## 2023-09-15 DIAGNOSIS — D509 Iron deficiency anemia, unspecified: Secondary | ICD-10-CM

## 2023-09-15 DIAGNOSIS — E785 Hyperlipidemia, unspecified: Secondary | ICD-10-CM | POA: Diagnosis present

## 2023-09-15 DIAGNOSIS — Z79899 Other long term (current) drug therapy: Secondary | ICD-10-CM | POA: Diagnosis not present

## 2023-09-15 DIAGNOSIS — R0603 Acute respiratory distress: Secondary | ICD-10-CM | POA: Diagnosis not present

## 2023-09-15 DIAGNOSIS — Z7982 Long term (current) use of aspirin: Secondary | ICD-10-CM

## 2023-09-15 DIAGNOSIS — I441 Atrioventricular block, second degree: Secondary | ICD-10-CM | POA: Diagnosis present

## 2023-09-15 DIAGNOSIS — J9601 Acute respiratory failure with hypoxia: Secondary | ICD-10-CM | POA: Diagnosis not present

## 2023-09-15 DIAGNOSIS — R0989 Other specified symptoms and signs involving the circulatory and respiratory systems: Secondary | ICD-10-CM | POA: Diagnosis not present

## 2023-09-15 DIAGNOSIS — Z87891 Personal history of nicotine dependence: Secondary | ICD-10-CM

## 2023-09-15 DIAGNOSIS — I5043 Acute on chronic combined systolic (congestive) and diastolic (congestive) heart failure: Secondary | ICD-10-CM | POA: Diagnosis not present

## 2023-09-15 DIAGNOSIS — I132 Hypertensive heart and chronic kidney disease with heart failure and with stage 5 chronic kidney disease, or end stage renal disease: Secondary | ICD-10-CM | POA: Diagnosis not present

## 2023-09-15 DIAGNOSIS — R0602 Shortness of breath: Secondary | ICD-10-CM | POA: Diagnosis not present

## 2023-09-15 DIAGNOSIS — I350 Nonrheumatic aortic (valve) stenosis: Secondary | ICD-10-CM | POA: Diagnosis present

## 2023-09-15 DIAGNOSIS — D649 Anemia, unspecified: Secondary | ICD-10-CM | POA: Diagnosis not present

## 2023-09-15 DIAGNOSIS — Z886 Allergy status to analgesic agent status: Secondary | ICD-10-CM | POA: Diagnosis not present

## 2023-09-15 DIAGNOSIS — Z809 Family history of malignant neoplasm, unspecified: Secondary | ICD-10-CM

## 2023-09-15 DIAGNOSIS — I517 Cardiomegaly: Secondary | ICD-10-CM | POA: Diagnosis not present

## 2023-09-15 DIAGNOSIS — E66811 Obesity, class 1: Secondary | ICD-10-CM | POA: Diagnosis present

## 2023-09-15 DIAGNOSIS — D61818 Other pancytopenia: Secondary | ICD-10-CM | POA: Diagnosis not present

## 2023-09-15 DIAGNOSIS — Z825 Family history of asthma and other chronic lower respiratory diseases: Secondary | ICD-10-CM

## 2023-09-15 DIAGNOSIS — Z6833 Body mass index (BMI) 33.0-33.9, adult: Secondary | ICD-10-CM

## 2023-09-15 DIAGNOSIS — Z860100 Personal history of colon polyps, unspecified: Secondary | ICD-10-CM

## 2023-09-15 DIAGNOSIS — Z515 Encounter for palliative care: Secondary | ICD-10-CM | POA: Diagnosis not present

## 2023-09-15 DIAGNOSIS — Z1152 Encounter for screening for COVID-19: Secondary | ICD-10-CM

## 2023-09-15 LAB — CBC WITH DIFFERENTIAL/PLATELET
Abs Immature Granulocytes: 0.01 K/uL (ref 0.00–0.07)
Basophils Absolute: 0 K/uL (ref 0.0–0.1)
Basophils Relative: 1 %
Eosinophils Absolute: 0.1 K/uL (ref 0.0–0.5)
Eosinophils Relative: 2 %
HCT: 26.2 % — ABNORMAL LOW (ref 36.0–46.0)
Hemoglobin: 7.9 g/dL — ABNORMAL LOW (ref 12.0–15.0)
Immature Granulocytes: 0 %
Lymphocytes Relative: 17 %
Lymphs Abs: 0.4 K/uL — ABNORMAL LOW (ref 0.7–4.0)
MCH: 29.6 pg (ref 26.0–34.0)
MCHC: 30.2 g/dL (ref 30.0–36.0)
MCV: 98.1 fL (ref 80.0–100.0)
Monocytes Absolute: 0.3 K/uL (ref 0.1–1.0)
Monocytes Relative: 13 %
Neutro Abs: 1.5 K/uL — ABNORMAL LOW (ref 1.7–7.7)
Neutrophils Relative %: 67 %
Platelets: 147 K/uL — ABNORMAL LOW (ref 150–400)
RBC: 2.67 MIL/uL — ABNORMAL LOW (ref 3.87–5.11)
RDW: 15.4 % (ref 11.5–15.5)
WBC: 2.3 K/uL — ABNORMAL LOW (ref 4.0–10.5)
nRBC: 0 % (ref 0.0–0.2)

## 2023-09-15 LAB — BASIC METABOLIC PANEL WITH GFR
Anion gap: 11 (ref 5–15)
BUN: 105 mg/dL — ABNORMAL HIGH (ref 8–23)
CO2: 19 mmol/L — ABNORMAL LOW (ref 22–32)
Calcium: 7.3 mg/dL — ABNORMAL LOW (ref 8.9–10.3)
Chloride: 110 mmol/L (ref 98–111)
Creatinine, Ser: 5.02 mg/dL — ABNORMAL HIGH (ref 0.44–1.00)
GFR, Estimated: 8 mL/min — ABNORMAL LOW (ref >60)
Glucose, Bld: 98 mg/dL (ref 70–99)
Potassium: 4.8 mmol/L (ref 3.5–5.1)
Sodium: 140 mmol/L (ref 135–145)

## 2023-09-15 LAB — BRAIN NATRIURETIC PEPTIDE: B Natriuretic Peptide: >4500 pg/mL — ABNORMAL HIGH (ref 0.0–100.0)

## 2023-09-15 MED ORDER — FUROSEMIDE 10 MG/ML IJ SOLN
60.0000 mg | Freq: Once | INTRAMUSCULAR | Status: AC
  Administered 2023-09-16: 60 mg via INTRAVENOUS
  Filled 2023-09-15: qty 6

## 2023-09-15 NOTE — ED Provider Notes (Signed)
 [place] EMERGENCY DEPARTMENT AT [facility] Provider Note   CSN: [id number] Arrival date & time: 09/15/23  2008     Patient presents with: Shortness of Breath   Sherry [name] is a 78 y.o. female here with SOB.  Her husband reports patient has been sleeping in bed for 3-4 days, very lethargic.  EMS reports she was hypoxic needing NRB on arrival.  Pt now awake.  Says she feels tired.  Family says she is supposed to get home oxygen delivered in 2 days but doesn't have it yet.  She has hx of renal failure but not on dialysis  {Add pertinent medical, surgical, social history, OB history to HPI:32947} HPI     Prior to Admission medications   Medication Sig Start Date End Date Taking? Authorizing Provider  acetaminophen  (TYLENOL ) 325 MG tablet Take 2 tablets (650 mg total) by mouth every 6 (six) hours as needed for mild pain (or Fever >/= 101). Patient taking differently: Take 650 mg by mouth as needed for mild pain (pain score 1-3) (or Fever >/= 101). 11/21/22   Jolaine Pac, DO  albuterol  (PROVENTIL ) (2.5 MG/3ML) 0.083% nebulizer solution Take 3 mLs (2.5 mg total) by nebulization every 6 (six) hours as needed for wheezing. 06/25/23   Fernand Prost, MD  amLODipine  (NORVASC ) 10 MG tablet Take 1 tablet (10 mg total) by mouth daily. 06/24/23 09/22/23  Fernand Prost, MD  aspirin  81 MG chewable tablet Chew 1 tablet (81 mg total) by mouth daily. 06/24/23   Fernand Prost, MD  calcium  carbonate (OSCAL) 1500 (600 Ca) MG TABS tablet Take 1 tablet (1,500 mg total) by mouth 3 (three) times daily with meals. 06/25/23   Fernand Prost, MD  cholecalciferol  (VITAMIN D3) 25 MCG (1000 UNIT) tablet Take 1 tablet (1,000 Units total) by mouth daily. 06/25/23 06/24/24  Fernand Prost, MD  cloNIDine  (CATAPRES ) 0.1 MG tablet Take 1 tablet (0.1 mg total) by mouth 2 (two) times daily. 07/11/23   Glena Harlene HERO, FNP  empagliflozin  (JARDIANCE ) 10 MG TABS tablet Take 1 tablet (10 mg total) by mouth daily before  breakfast. 05/12/23   Rolan Ezra RAMAN, MD  febuxostat  (ULORIC ) 40 MG tablet Take 0.5 tablets (20 mg total) by mouth daily. 06/25/23 06/24/24  Fernand Prost, MD  hydrALAZINE  (APRESOLINE ) 100 MG tablet TAKE 1 TABLET(100 MG) BY MOUTH THREE TIMES DAILY 02/04/23 02/04/24  Fernand Prost, MD  lidocaine  (LMX) 4 % cream Apply 1 Application topically 2 (two) times daily as needed (Pain on sacral wound area). 06/25/23   Fernand Prost, MD  multivitamin (RENA-VIT) TABS tablet Take 1 tablet by mouth at bedtime. 06/25/23   Fernand Prost, MD  pantoprazole  (PROTONIX ) 40 MG tablet TAKE 1 TABLET BY MOUTH DAILY 07/21/23   Fernand Prost, MD  PHENobarbital  (LUMINAL) 64.8 MG tablet Take 2 tablets (129.6 mg total) by mouth at bedtime. 05/23/23   Georjean Darice HERO, MD  potassium chloride  SA (KLOR-CON  M) 20 MEQ tablet Take 1 tablet (20 mEq total) by mouth daily. 07/11/23   [place], Harlene HERO, FNP  rosuvastatin  (CRESTOR ) 40 MG tablet Take 1 tablet (40 mg total) by mouth daily. 06/25/23 06/19/24  Fernand Prost, MD  sevelamer  carbonate (RENVELA ) 800 MG tablet Take 1 tablet (800 mg total) by mouth 3 (three) times daily with meals. 04/14/23 04/13/24  Fernand Prost, MD  torsemide  (DEMADEX ) 20 MG tablet Take 80 mg daily every other day ALTERNATING with 60 mg daily every other day 07/25/23   Glena Harlene HERO, FNP  zonisamide  (  ZONEGRAN ) 100 MG capsule TAKE 1 CAPSULE BY MOUTH AT NIGHT 04/24/23   Georjean Darice HERO, MD    Allergies: Ibuprofen and Lactose intolerance (gi)    Review of Systems  Updated Vital Signs BP 124/80 (BP Location: Left Arm)   Pulse 83   Temp 99.7 F (37.6 C) (Oral)   Resp 15   Ht 5' 1 (1.549 m)   Wt 65.8 kg   SpO2 100%   BMI 27.40 kg/m   Physical Exam Constitutional:      General: She is not in acute distress. HENT:     Head: Normocephalic and atraumatic.  Eyes:     Conjunctiva/sclera: Conjunctivae normal.     Pupils: Pupils are equal, round, and reactive to light.  Cardiovascular:     Rate and Rhythm: Normal rate and  regular rhythm.  Pulmonary:     Effort: Pulmonary effort is normal. No respiratory distress.     Comments: Rhonchi bilaterally, sating 98% on room air Abdominal:     General: There is no distension.     Tenderness: There is no abdominal tenderness.  Skin:    General: Skin is warm and dry.  Neurological:     General: No focal deficit present.     Mental Status: She is alert. Mental status is at baseline.  Psychiatric:        Mood and Affect: Mood normal.        Behavior: Behavior normal.     (all labs ordered are listed, but only abnormal results are displayed) Labs Reviewed - No data to display  EKG: None  Radiology: No results found.  {Document cardiac monitor, telemetry assessment procedure when appropriate:32947} Procedures   Medications Ordered in the ED - No data to display    {Click here for ABCD2, HEART and other calculators REFRESH Note before signing:1}                              Medical Decision Making Amount and/or Complexity of Data Reviewed Labs: ordered. Radiology: ordered.   This patient presents to the ED with concern for weakness, SOB. This involves an extensive number of treatment options, and is a complaint that carries with it a high risk of complications and morbidity.  The differential diagnosis includes PNA vs infection vs anemia vs pleural effusion vs CHF vs other  Co-morbidities that complicate the patient evaluation: hx of cardiac disease, anemia  Additional history obtained from husband  External records from outside source obtained and reviewed including cardiology evaluation 08/20/23 hx of CHF, CAD, moderate aortic stenosis, hx of anemia  I ordered and personally interpreted labs.  The pertinent results include:  ***  I ordered imaging studies including dg chest I independently visualized and interpreted imaging which showed *** I agree with the radiologist interpretation  The patient was maintained on a cardiac monitor.  I  personally viewed and interpreted the cardiac monitored which showed an underlying rhythm of: ***  Per my interpretation the patient's ECG shows no acute ischemic findings  I ordered medication including ***  for ***  I have reviewed the patients home medicines and have made adjustments as needed  Test Considered: doubt acute PE, sepsis, meningitis  I requested consultation with the ***,  and discussed lab and imaging findings as well as pertinent plan - they recommend: ***  After the interventions noted above, I reevaluated the patient and found that they have: {resolved/improved/worsened:23923::improved}  Social Determinants  of Health:***  Dispostion:  After consideration of the diagnostic results and the patients response to treatment, I feel that the patent would benefit from ***.   {Document critical care time when appropriate  Document review of labs and clinical decision tools ie [id number], etc  Document your independent review of radiology images and any outside records  Document your discussion with family members, caretakers and with consultants  Document social determinants of health affecting pt's care  Document your decision making why or why not admission, treatments were needed:32947:::1}   Final diagnoses:  None    ED Discharge Orders     None

## 2023-09-15 NOTE — Telephone Encounter (Unsigned)
 Copied from CRM [id number]. Topic: General - Other >> Sep 15, 2023  9:45 AM Miquel SAILOR wrote: Reason for CRM: Patient daughter Apolinar requesting oxygen tank. Needs call back on update [phone] or [phone]

## 2023-09-15 NOTE — ED Triage Notes (Signed)
 Patient from home with complaints of SOB and lethargic all day. When EMS arrived unresponsive and hypoxic. She was sat up and O2 applied and she woke up. Non-rebreather at 15L. She is retaining fluid. HX of renal failure but is not a candiate for dialysis. Patient needs possibly home O2.   Vitals: BP: 145/83 SPO2: 100% HR: 84 CBG: 110

## 2023-09-15 NOTE — ED Notes (Signed)
 Non-rebreather removed and O2 sats is at 100% room air.

## 2023-09-15 NOTE — Telephone Encounter (Signed)
 Call to patient'sdaughter Apolinar.  Would like to get Oxygen for the patient in her home.  Uses a nebulizer for har Asthsma.  Thinks patient needs to have additional Oxygen at times.  Need to have patient scheduled for an appointment for assessment of need so that an order can be written for the the patient. Copied from CRM [id number]. Topic: General - Other >> Sep 15, 2023  9:45 AM Miquel SAILOR wrote: Reason for CRM: Patient daughter Apolinar requesting oxygen tank. Needs call back on update [phone] or [phone] >> Sep 15, 2023 11:48 AM Graeme ORN wrote: Patient daughter returned missed call to [facility]

## 2023-09-16 DIAGNOSIS — I350 Nonrheumatic aortic (valve) stenosis: Secondary | ICD-10-CM | POA: Diagnosis not present

## 2023-09-16 DIAGNOSIS — Z87891 Personal history of nicotine dependence: Secondary | ICD-10-CM | POA: Diagnosis not present

## 2023-09-16 DIAGNOSIS — Z886 Allergy status to analgesic agent status: Secondary | ICD-10-CM | POA: Diagnosis not present

## 2023-09-16 DIAGNOSIS — Z7189 Other specified counseling: Secondary | ICD-10-CM | POA: Diagnosis not present

## 2023-09-16 DIAGNOSIS — D61818 Other pancytopenia: Secondary | ICD-10-CM | POA: Diagnosis present

## 2023-09-16 DIAGNOSIS — Z8673 Personal history of transient ischemic attack (TIA), and cerebral infarction without residual deficits: Secondary | ICD-10-CM | POA: Diagnosis not present

## 2023-09-16 DIAGNOSIS — J9601 Acute respiratory failure with hypoxia: Secondary | ICD-10-CM

## 2023-09-16 DIAGNOSIS — I132 Hypertensive heart and chronic kidney disease with heart failure and with stage 5 chronic kidney disease, or end stage renal disease: Secondary | ICD-10-CM | POA: Diagnosis present

## 2023-09-16 DIAGNOSIS — E785 Hyperlipidemia, unspecified: Secondary | ICD-10-CM | POA: Diagnosis present

## 2023-09-16 DIAGNOSIS — Z7984 Long term (current) use of oral hypoglycemic drugs: Secondary | ICD-10-CM | POA: Diagnosis not present

## 2023-09-16 DIAGNOSIS — I5033 Acute on chronic diastolic (congestive) heart failure: Secondary | ICD-10-CM

## 2023-09-16 DIAGNOSIS — I5084 End stage heart failure: Secondary | ICD-10-CM | POA: Diagnosis present

## 2023-09-16 DIAGNOSIS — I509 Heart failure, unspecified: Secondary | ICD-10-CM

## 2023-09-16 DIAGNOSIS — Z7401 Bed confinement status: Secondary | ICD-10-CM | POA: Diagnosis not present

## 2023-09-16 DIAGNOSIS — D631 Anemia in chronic kidney disease: Secondary | ICD-10-CM | POA: Diagnosis present

## 2023-09-16 DIAGNOSIS — I5043 Acute on chronic combined systolic (congestive) and diastolic (congestive) heart failure: Secondary | ICD-10-CM | POA: Diagnosis not present

## 2023-09-16 DIAGNOSIS — Z8249 Family history of ischemic heart disease and other diseases of the circulatory system: Secondary | ICD-10-CM | POA: Diagnosis not present

## 2023-09-16 DIAGNOSIS — E66811 Obesity, class 1: Secondary | ICD-10-CM | POA: Diagnosis present

## 2023-09-16 DIAGNOSIS — Z79899 Other long term (current) drug therapy: Secondary | ICD-10-CM | POA: Diagnosis not present

## 2023-09-16 DIAGNOSIS — I441 Atrioventricular block, second degree: Secondary | ICD-10-CM | POA: Diagnosis present

## 2023-09-16 DIAGNOSIS — Z743 Need for continuous supervision: Secondary | ICD-10-CM | POA: Diagnosis not present

## 2023-09-16 DIAGNOSIS — G40909 Epilepsy, unspecified, not intractable, without status epilepticus: Secondary | ICD-10-CM | POA: Diagnosis present

## 2023-09-16 DIAGNOSIS — E739 Lactose intolerance, unspecified: Secondary | ICD-10-CM | POA: Diagnosis present

## 2023-09-16 DIAGNOSIS — I2510 Atherosclerotic heart disease of native coronary artery without angina pectoris: Secondary | ICD-10-CM | POA: Diagnosis present

## 2023-09-16 DIAGNOSIS — Z66 Do not resuscitate: Secondary | ICD-10-CM | POA: Diagnosis present

## 2023-09-16 DIAGNOSIS — K219 Gastro-esophageal reflux disease without esophagitis: Secondary | ICD-10-CM | POA: Diagnosis present

## 2023-09-16 DIAGNOSIS — Z1152 Encounter for screening for COVID-19: Secondary | ICD-10-CM | POA: Diagnosis not present

## 2023-09-16 DIAGNOSIS — Z515 Encounter for palliative care: Secondary | ICD-10-CM

## 2023-09-16 DIAGNOSIS — R404 Transient alteration of awareness: Secondary | ICD-10-CM | POA: Diagnosis not present

## 2023-09-16 DIAGNOSIS — Z6833 Body mass index (BMI) 33.0-33.9, adult: Secondary | ICD-10-CM | POA: Diagnosis not present

## 2023-09-16 DIAGNOSIS — N185 Chronic kidney disease, stage 5: Secondary | ICD-10-CM | POA: Diagnosis present

## 2023-09-16 LAB — COMPREHENSIVE METABOLIC PANEL WITH GFR
ALT: 20 U/L (ref 0–44)
AST: 39 U/L (ref 15–41)
Albumin: 2.6 g/dL — ABNORMAL LOW (ref 3.5–5.0)
Alkaline Phosphatase: 94 U/L (ref 38–126)
Anion gap: 14 (ref 5–15)
BUN: 104 mg/dL — ABNORMAL HIGH (ref 8–23)
CO2: 18 mmol/L — ABNORMAL LOW (ref 22–32)
Calcium: 7.3 mg/dL — ABNORMAL LOW (ref 8.9–10.3)
Chloride: 108 mmol/L (ref 98–111)
Creatinine, Ser: 5 mg/dL — ABNORMAL HIGH (ref 0.44–1.00)
GFR, Estimated: 8 mL/min — ABNORMAL LOW (ref >60)
Glucose, Bld: 85 mg/dL (ref 70–99)
Potassium: 4.8 mmol/L (ref 3.5–5.1)
Sodium: 140 mmol/L (ref 135–145)
Total Bilirubin: 0.6 mg/dL (ref 0.0–1.2)
Total Protein: 5.6 g/dL — ABNORMAL LOW (ref 6.5–8.1)

## 2023-09-16 LAB — CBC
HCT: 26.2 % — ABNORMAL LOW (ref 36.0–46.0)
Hemoglobin: 7.9 g/dL — ABNORMAL LOW (ref 12.0–15.0)
MCH: 29.6 pg (ref 26.0–34.0)
MCHC: 30.2 g/dL (ref 30.0–36.0)
MCV: 98.1 fL (ref 80.0–100.0)
Platelets: 144 K/uL — ABNORMAL LOW (ref 150–400)
RBC: 2.67 MIL/uL — ABNORMAL LOW (ref 3.87–5.11)
RDW: 15.3 % (ref 11.5–15.5)
WBC: 2.2 K/uL — ABNORMAL LOW (ref 4.0–10.5)
nRBC: 0 % (ref 0.0–0.2)

## 2023-09-16 LAB — RESP PANEL BY RT-PCR (RSV, FLU A&B, COVID)  RVPGX2
Influenza A by PCR: NEGATIVE
Influenza B by PCR: NEGATIVE
Resp Syncytial Virus by PCR: NEGATIVE
SARS Coronavirus 2 by RT PCR: NEGATIVE

## 2023-09-16 LAB — PHENOBARBITAL LEVEL: Phenobarbital: 31.2 ug/mL (ref 15.0–40.0)

## 2023-09-16 MED ORDER — ALBUTEROL SULFATE (2.5 MG/3ML) 0.083% IN NEBU: 2.5000 mg | INHALATION_SOLUTION | Freq: Four times a day (QID) | RESPIRATORY_TRACT | Status: DC | PRN

## 2023-09-16 MED ORDER — CLONIDINE HCL 0.1 MG PO TABS
0.1000 mg | ORAL_TABLET | Freq: Two times a day (BID) | ORAL | Status: DC
Start: 2023-09-16 — End: 2023-09-18
  Administered 2023-09-16 – 2023-09-18 (×6): 0.1 mg via ORAL
  Filled 2023-09-16 (×6): qty 1

## 2023-09-16 MED ORDER — HEPARIN SODIUM (PORCINE) 5000 UNIT/ML IJ SOLN
5000.0000 [IU] | Freq: Three times a day (TID) | INTRAMUSCULAR | Status: DC
  Administered 2023-09-16 – 2023-09-18 (×8): 5000 [IU] via SUBCUTANEOUS
  Filled 2023-09-16 (×8): qty 1

## 2023-09-16 MED ORDER — RENA-VITE PO TABS
1.0000 | ORAL_TABLET | Freq: Every day | ORAL | Status: DC
Start: 2023-09-16 — End: 2023-09-18
  Administered 2023-09-16 – 2023-09-17 (×3): 1 via ORAL
  Filled 2023-09-16 (×4): qty 1

## 2023-09-16 MED ORDER — FEBUXOSTAT 40 MG PO TABS
20.0000 mg | ORAL_TABLET | Freq: Every day | ORAL | Status: DC
  Administered 2023-09-16 – 2023-09-18 (×3): 20 mg via ORAL
  Filled 2023-09-16 (×5): qty 1

## 2023-09-16 MED ORDER — AMLODIPINE BESYLATE 10 MG PO TABS
10.0000 mg | ORAL_TABLET | Freq: Every day | ORAL | Status: DC
  Administered 2023-09-16 – 2023-09-18 (×3): 10 mg via ORAL
  Filled 2023-09-16 (×2): qty 1
  Filled 2023-09-16: qty 2

## 2023-09-16 MED ORDER — PHENOBARBITAL 32.4 MG PO TABS
129.6000 mg | ORAL_TABLET | Freq: Every day | ORAL | Status: DC
  Administered 2023-09-16 – 2023-09-17 (×3): 129.6 mg via ORAL
  Filled 2023-09-16: qty 4
  Filled 2023-09-16: qty 2
  Filled 2023-09-16: qty 4

## 2023-09-16 MED ORDER — EMPAGLIFLOZIN 10 MG PO TABS
10.0000 mg | ORAL_TABLET | Freq: Every day | ORAL | Status: DC
Start: 2023-09-16 — End: 2023-09-18
  Administered 2023-09-16 – 2023-09-18 (×3): 10 mg via ORAL
  Filled 2023-09-16 (×3): qty 1

## 2023-09-16 MED ORDER — ONDANSETRON HCL 4 MG PO TABS: 4.0000 mg | ORAL_TABLET | Freq: Four times a day (QID) | ORAL | Status: DC | PRN

## 2023-09-16 MED ORDER — ZONISAMIDE 100 MG PO CAPS
100.0000 mg | ORAL_CAPSULE | Freq: Every day | ORAL | Status: DC
  Administered 2023-09-16 – 2023-09-17 (×3): 100 mg via ORAL
  Filled 2023-09-16 (×4): qty 1

## 2023-09-16 MED ORDER — FUROSEMIDE 10 MG/ML IJ SOLN
60.0000 mg | Freq: Two times a day (BID) | INTRAMUSCULAR | Status: DC
  Administered 2023-09-16 – 2023-09-18 (×5): 60 mg via INTRAVENOUS
  Filled 2023-09-16 (×6): qty 6

## 2023-09-16 MED ORDER — HYDRALAZINE HCL 50 MG PO TABS
100.0000 mg | ORAL_TABLET | Freq: Three times a day (TID) | ORAL | Status: DC
  Administered 2023-09-16 – 2023-09-18 (×8): 100 mg via ORAL
  Filled 2023-09-16 (×8): qty 2

## 2023-09-16 MED ORDER — ASPIRIN 81 MG PO CHEW
81.0000 mg | CHEWABLE_TABLET | Freq: Every day | ORAL | Status: DC
  Administered 2023-09-16 – 2023-09-18 (×3): 81 mg via ORAL
  Filled 2023-09-16 (×3): qty 1

## 2023-09-16 MED ORDER — VITAMIN D 25 MCG (1000 UNIT) PO TABS
1000.0000 [IU] | ORAL_TABLET | Freq: Every day | ORAL | Status: DC
  Administered 2023-09-16 – 2023-09-18 (×3): 1000 [IU] via ORAL
  Filled 2023-09-16 (×3): qty 1

## 2023-09-16 MED ORDER — ROSUVASTATIN CALCIUM 20 MG PO TABS
40.0000 mg | ORAL_TABLET | Freq: Every day | ORAL | Status: DC
  Administered 2023-09-16 – 2023-09-18 (×3): 40 mg via ORAL
  Filled 2023-09-16 (×3): qty 2

## 2023-09-16 MED ORDER — ONDANSETRON HCL 4 MG/2ML IJ SOLN: 4.0000 mg | Freq: Four times a day (QID) | INTRAMUSCULAR | Status: DC | PRN

## 2023-09-16 MED ORDER — PANTOPRAZOLE SODIUM 40 MG PO TBEC
40.0000 mg | DELAYED_RELEASE_TABLET | Freq: Every day | ORAL | Status: DC
Start: 2023-09-16 — End: 2023-09-18
  Administered 2023-09-16 – 2023-09-18 (×3): 40 mg via ORAL
  Filled 2023-09-16 (×3): qty 1

## 2023-09-16 MED ORDER — ACETAMINOPHEN 650 MG RE SUPP: 650.0000 mg | Freq: Four times a day (QID) | RECTAL | Status: DC | PRN

## 2023-09-16 MED ORDER — SEVELAMER CARBONATE 800 MG PO TABS
800.0000 mg | ORAL_TABLET | Freq: Three times a day (TID) | ORAL | Status: DC
  Administered 2023-09-16 – 2023-09-18 (×8): 800 mg via ORAL
  Filled 2023-09-16 (×8): qty 1

## 2023-09-16 MED ORDER — ACETAMINOPHEN 325 MG PO TABS
650.0000 mg | ORAL_TABLET | Freq: Four times a day (QID) | ORAL | Status: DC | PRN
  Administered 2023-09-18: 650 mg via ORAL
  Filled 2023-09-16: qty 2

## 2023-09-16 MED ORDER — POTASSIUM CHLORIDE CRYS ER 20 MEQ PO TBCR
20.0000 meq | EXTENDED_RELEASE_TABLET | Freq: Every day | ORAL | Status: DC
  Administered 2023-09-16 – 2023-09-18 (×3): 20 meq via ORAL
  Filled 2023-09-16 (×3): qty 1

## 2023-09-16 MED ORDER — CALCIUM CARBONATE 1250 (500 CA) MG PO TABS
1.0000 | ORAL_TABLET | Freq: Three times a day (TID) | ORAL | Status: DC
  Administered 2023-09-16 – 2023-09-18 (×7): 1250 mg via ORAL
  Filled 2023-09-16 (×7): qty 1

## 2023-09-16 NOTE — Plan of Care (Signed)
  Problem: Education: Goal: Knowledge of General Education information will improve Description: Including pain rating scale, medication(s)/side effects and non-pharmacologic comfort measures Outcome: Progressing   Problem: Coping: Goal: Level of anxiety will decrease Outcome: Progressing   Problem: Elimination: Goal: Will not experience complications related to bowel motility Outcome: Progressing   

## 2023-09-16 NOTE — Plan of Care (Signed)
  Problem: Education: Goal: Knowledge of General Education information will improve Description: Including pain rating scale, medication(s)/side effects and non-pharmacologic comfort measures 09/16/2023 1730 by Khaleem Burchill M, RN Outcome: Progressing 09/16/2023 1605 by Makari Portman M, RN Outcome: Progressing   Problem: Nutrition: Goal: Adequate nutrition will be maintained Outcome: Progressing   Problem: Coping: Goal: Level of anxiety will decrease 09/16/2023 1730 by Ruthy Forry M, RN Outcome: Progressing 09/16/2023 1605 by Anchor Dwan M, RN Outcome: Progressing   Problem: Elimination: Goal: Will not experience complications related to bowel motility Outcome: Progressing

## 2023-09-16 NOTE — ED Notes (Signed)
 Nt called CCMD@2 :56am

## 2023-09-16 NOTE — Consult Note (Signed)
 Palliative Care Consult Note                                  Date: 09/16/2023   Patient Name: Sherry [name]  DOB: 01-11-1946  MRN: [id number]  Age / Sex: 78 y.o., female  PCP: Myrna Bitters, DO Referring Physician: Christobal Guadalajara, MD  Reason for Consultation: Establishing goals of care  HPI/Patient Profile: 78 y.o. female  with past medical history of HFrEF, severe aortic stenosis not TAVR candidate, diffuse moderate-severe coronary artery disease not a candidate for CABG, CKD stage V, pancytopenia, seizure disorder, anemia, GERD, HLD, HTN, history of Mobitz type I second-degree AVB.  She presented to the ED on 09/15/2023 with increased lethargy and shortness of breath.  Per EMS, patient was noted to be lethargic with O2 sats in the 70s.  Palliative Medicine has been consulted for goals of care discussions. Patient and family are faced with anticipatory care needs and complex medical decision making.   Clinical Assessment and Goals of Care:   Extensive chart review has been completed including labs, vital signs, imaging, progress/consult notes, orders, medications and available advance directive documents.    I assessed patient at bedside. She is lethargic, but oriented to person and hospital. She reports feeling better than when she came in. Her husband is at bedside.   I spoke with her daughter/Sherry [name] by phone to discuss diagnosis, prognosis, GOC, EOL wishes, and disposition.  I introduced Palliative Medicine as specialized medical care for people living with serious illness. It focuses on providing relief from the symptoms and stress of a serious illness.   Created space and opportunity for patient and family to express thoughts and feelings regarding current medical situation. Values and goals of care were attempted to be elicited.  Life Review: ***  Functional Status: Patient lives at home with her husband.  Granddaughter nearby and  helps often. Patient needs assistance with ADLs.   Patient/Family Understanding of Illness: ***  Discussion: *** We discussed patient's current illness and what it means in the larger context of his/her ongoing co-morbidities. Current clinical status was reviewed. Natural disease trajectory of *** was discussed.  Discussed the importance of continued conversation with the medical team regarding overall plan of care and treatment options, ensuring decisions are within the context of the patients values and GOCs.  Questions and concerns addressed. Patient/family encouraged to call with questions or concerns.   Review of Systems  Unable to perform ROS   Objective:   Primary Diagnoses: Present on Admission: **None**   Physical Exam Vitals reviewed.  Constitutional:      General: She is not in acute distress.    Appearance: She is ill-appearing.  Pulmonary:     Effort: No respiratory distress.  Neurological:     Mental Status: She is lethargic.     Comments: Oriented to self and hospital     Vital Signs:  BP (!) 153/81   Pulse 77   Temp 98.3 F (36.8 C) (Oral)   Resp (!) 21   Ht 5' 1 (1.549 m)   Wt 65.8 kg   SpO2 92%   BMI 27.40 kg/m   Palliative Assessment/Data: ***     Assessment & Plan:   SUMMARY OF RECOMMENDATIONS   ***  Primary Decision Maker: NEXT OF KIN - husband and daughter  Existing Vynca/ACP Documentation: MOST form from 05/2023   Code Status/Advance Care Planning: DNR  Symptom  Management:  ***  Prognosis:  {Palliative Care Prognosis:23504}  Discharge Planning:  {Palliative dispostion:23505}   Discussed with: ***    Thank you for allowing us  to participate in the care of Faithlyn B Savary   Time Total: ***  Greater than 50%  of this time was spent counseling and coordinating care related to the above assessment and plan.  Signed by: Recardo Loll, NP Palliative Medicine Team  Team Phone # [phone]  For  individual providers, please see AMION

## 2023-09-16 NOTE — H&P (Addendum)
 History and Physical    Sherry [name] FMW:[id number] DOB: Jul 15, 1945 DOA: 09/15/2023  PCP: Myrna Bitters, DO  Patient coming from: home  I have personally briefly reviewed patient's old medical records in [facility]  Chief Complaint: sob  and lethargy progressive x 4 days  HPI: Sherry [name] is a 78 y.o. female with medical history significant of severe aortic stenosis not TAVR candidate ; CHFpef- NYHA class III heart failure; diffuse moderate-severe coronary disease that was managed medically chronic kidney disease, pancytopenia,stage V, CAD, Seizure d/o,GERD, HLD, HTN, obesity, Anemia, TIA , H/o Mobitz type 1 2nd degree AVB who present to ED BIB EMS s/p call out for increase lethargy and sob.  Per EMS in the field patient was noted to be lethargic with sat in 70's for which she was placed on NRB and transported to ED. Patient now in ED of NRB on room air. Patient notes she feels improved after lasix .  She has no current chest pain , n/v/d or abdominal pain.   ED Course:  Temp 99.7, bp 124/80, hr 82, rr 15 ,sat 100% n ra  EKG: nsr, PVC  Wbc 2.3,  hgb 7.9, plt 147 Na 140, K 4.8, cl 110, bicarb 19, glu 98, cr 5.02 ( 4.67) Calcium  7.3,  Bnp >4500 cxr IMPRESSION: Cardiomegaly with central pulmonary vascular congestion and small bilateral pleural effusions.  Rvp neg  Tx lasix  60 mg   Review of Systems: As per HPI otherwise 10 point review of systems negative.   Past Medical History:  Diagnosis Date   Abdominal pain 02/25/2023   Abscess 10/30/2022   Acute diastolic congestive heart failure (HCC) 08/31/2022   Acute kidney injury superimposed on CKD (HCC) 04/28/2011   AKI (acute kidney injury) (HCC) 01/31/2020   Anemia    Aortic stenosis    Arthritis    Bilateral lower extremity edema    Bilateral lower extremity edema    Blurry vision, bilateral 05/08/2016   Bradycardia    a. 01/2013 - asymptomatic.   Chronic diastolic heart failure (HCC)    Chronic sinus  bradycardia 07/02/2012   CKD (chronic kidney disease)    a. baseline CKD stage III   CKD (chronic kidney disease) stage 4, GFR 15-29 ml/min (HCC) 09/17/2022   Coronary artery disease    Electrolyte abnormality 08/30/2022   Epilepsy (HCC) 06/04/2006   EEG 02/19/12  Interpretation:  This is an abnormal EEG demonstrating continuous and generalized slowing of the background in the theta frequency. Irregular rhythm of EKG noted.  Clinical correlation: the generalized slowing is consistent with a mild encephalopathy of nonspecific etiologies for which the differential would include infectious, toxic, metabolic, inflammatory, and vascular etiologies.    GERD (gastroesophageal reflux disease)    Headache(784.0)    Heart block AV second degree 02/15/2013   Heart murmur    Hyperlipidemia    Hypertension    Hypocalcemia 08/27/2022   Patient with chronic hypocalcemia, found to have a critical calcium  of 5.8 on CMP today.  Albumin 2.7, corrected calcium  6.8.  Likely multifactorial due to CKD, vitamin D  deficiency.  This was discussed extensively with the patient and family.  Risks and benefits of inpatient management discussed and family decided to continue with current plan of outpatient management with close follow-up.  Pleas   Hypotension    Hypovolemic shock (HCC)    Internal and external hemorrhoids without complication 01/20/2012   Colonoscopy in 2008 recs repeat in 5 years   Intractable focal epilepsy with impairment  of consciousness (HCC) 01/02/2012   Late effects of cerebrovascular disease 01/23/2012   MRI Brain -Calvarium/skull base: No focal marrow replacing lesion suggestive of neoplasm. -Orbits: Grossly unremarkable. -Paranasal sinuses: Imaged portions clear. -Brain: No acute abnormalities such as hemorrhage, hydrocephalus, acute ischemia, or evidence of a mass lesion.  There is patchy bilateral cerebral white matter T2 and flair signal hyper intensities.  Centimeter or smaller CSF intensity    Melena 02/25/2023   Mobitz (type) I [facility]) atrioventricular block    a. 01/2013 - asymptomatic.   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   TIA (transient ischemic attack) 2014   pt stated she had mini strokes   UTI (urinary tract infection) 03/07/2022   Vitamin D  deficiency 04/22/2014    Past Surgical History:  Procedure Laterality Date   ABDOMINAL HYSTERECTOMY     BIOPSY  02/28/2023   Procedure: BIOPSY;  Surgeon: Burnette Fallow, MD;  Location: [facility] ENDOSCOPY;  Service: Gastroenterology;;   COLONOSCOPY WITH PROPOFOL  N/A 03/02/2023   Procedure: COLONOSCOPY WITH PROPOFOL ;  Surgeon: Saintclair Jasper, MD;  Location: [facility] ENDOSCOPY;  Service: Gastroenterology;  Laterality: N/A;   ESOPHAGOGASTRODUODENOSCOPY (EGD) WITH PROPOFOL  N/A 02/28/2023   Procedure: ESOPHAGOGASTRODUODENOSCOPY (EGD) WITH PROPOFOL ;  Surgeon: Burnette Fallow, MD;  Location: [facility] ENDOSCOPY;  Service: Gastroenterology;  Laterality: N/A;   LEFT AND RIGHT HEART CATHETERIZATION WITH CORONARY ANGIOGRAM N/A 05/24/2013   Procedure: LEFT AND RIGHT HEART CATHETERIZATION WITH CORONARY ANGIOGRAM;  Surgeon: Victory LELON Claudene DOUGLAS, MD;  Location: [facility] CATH LAB;  Service: Cardiovascular;  Laterality: N/A;   LEFT HEART CATH N/A 02/01/2020   Procedure: Left Heart Cath;  Surgeon: Cherrie Toribio SAUNDERS, MD;  Location: [facility] INVASIVE CV LAB;  Service: Cardiovascular;  Laterality: N/A;   POLYPECTOMY  03/02/2023   Procedure: POLYPECTOMY;  Surgeon: Saintclair Jasper, MD;  Location: [facility] ENDOSCOPY;  Service: Gastroenterology;;   RIGHT HEART CATH N/A 02/01/2020   Procedure: RIGHT HEART CATH;  Surgeon: Cherrie Toribio SAUNDERS, MD;  Location: [facility]  INVASIVE CV LAB;  Service: Cardiovascular;  Laterality: N/A;     reports that she quit smoking about 23 years ago. Her smoking use included cigarettes. She started smoking about 33 years ago. She has a 5 pack-year smoking history. She has never used smokeless tobacco. She reports that she does not drink alcohol and does not use drugs.  Allergies  Allergen Reactions   Ibuprofen Other (See Comments)    Dr advised pt not to take ibuprofen   Lactose Intolerance (Gi) Diarrhea    Family History  Problem Relation Age of Onset   Hypertension Other    Heart disease Mother    Hypertension Mother    Cancer Father    Obesity Son    Asthma Sister    Heart disease Sister    Heart disease Sister     Prior to Admission medications   Medication Sig Start Date End Date Taking? Authorizing Provider  acetaminophen  (TYLENOL ) 325 MG tablet Take 2 tablets (650 mg total) by mouth every 6 (six) hours as needed for mild pain (or Fever >/= 101). Patient taking differently: Take 650 mg by mouth as needed for mild pain (pain score 1-3) (or  Fever >/= 101). 11/21/22   Jolaine Pac, DO  albuterol  (PROVENTIL ) (2.5 MG/3ML) 0.083% nebulizer solution Take 3 mLs (2.5 mg total) by nebulization every 6 (six) hours as needed for wheezing. 06/25/23   Fernand Prost, MD  amLODipine  (NORVASC ) 10 MG tablet Take 1 tablet (10 mg total) by mouth daily. 06/24/23 09/22/23  Fernand Prost, MD  aspirin  81 MG chewable tablet Chew 1 tablet (81 mg total) by mouth daily. 06/24/23   Fernand Prost, MD  calcium  carbonate (OSCAL) 1500 (600 Ca) MG TABS tablet Take 1 tablet (1,500 mg total) by mouth 3 (three) times daily with meals. 06/25/23   Fernand Prost, MD  cholecalciferol  (VITAMIN D3) 25 MCG (1000 UNIT) tablet Take 1 tablet (1,000 Units total) by mouth daily. 06/25/23 06/24/24  Fernand Prost, MD  cloNIDine  (CATAPRES ) 0.1 MG tablet Take 1 tablet (0.1 mg total) by mouth 2 (two) times daily. 07/11/23   Glena Harlene HERO, FNP  empagliflozin  (JARDIANCE ) 10 MG  TABS tablet Take 1 tablet (10 mg total) by mouth daily before breakfast. 05/12/23   Rolan Ezra RAMAN, MD  febuxostat  (ULORIC ) 40 MG tablet Take 0.5 tablets (20 mg total) by mouth daily. 06/25/23 06/24/24  Fernand Prost, MD  hydrALAZINE  (APRESOLINE ) 100 MG tablet TAKE 1 TABLET(100 MG) BY MOUTH THREE TIMES DAILY 02/04/23 02/04/24  Fernand Prost, MD  lidocaine  (LMX) 4 % cream Apply 1 Application topically 2 (two) times daily as needed (Pain on sacral wound area). 06/25/23   Fernand Prost, MD  multivitamin (RENA-VIT) TABS tablet Take 1 tablet by mouth at bedtime. 06/25/23   Fernand Prost, MD  pantoprazole  (PROTONIX ) 40 MG tablet TAKE 1 TABLET BY MOUTH DAILY 07/21/23   Fernand Prost, MD  PHENobarbital  (LUMINAL) 64.8 MG tablet Take 2 tablets (129.6 mg total) by mouth at bedtime. 05/23/23   Georjean Darice HERO, MD  potassium chloride  SA (KLOR-CON  M) 20 MEQ tablet Take 1 tablet (20 mEq total) by mouth daily. 07/11/23   Milford, Harlene HERO, FNP  rosuvastatin  (CRESTOR ) 40 MG tablet Take 1 tablet (40 mg total) by mouth daily. 06/25/23 06/19/24  Fernand Prost, MD  sevelamer  carbonate (RENVELA ) 800 MG tablet Take 1 tablet (800 mg total) by mouth 3 (three) times daily with meals. 04/14/23 04/13/24  Fernand Prost, MD  torsemide  (DEMADEX ) 20 MG tablet Take 80 mg daily every other day ALTERNATING with 60 mg daily every other day 07/25/23   Glena Harlene HERO, FNP  zonisamide  (ZONEGRAN ) 100 MG capsule TAKE 1 CAPSULE BY MOUTH AT NIGHT 04/24/23   Georjean Darice HERO, MD    Physical Exam: Vitals:   09/15/23 2023 09/15/23 2200 09/15/23 2230 09/15/23 2237  BP:  (!) 148/74 (!) 152/69   Pulse:  72 76   Resp:  16 14   Temp:    98.5 F (36.9 C)  TempSrc:    Oral  SpO2:  96% 96%   Weight: 65.8 kg     Height: 5' 1 (1.549 m)       Constitutional: NAD, calm, comfortable Vitals:   09/15/23 2023 09/15/23 2200 09/15/23 2230 09/15/23 2237  BP:  (!) 148/74 (!) 152/69   Pulse:  72 76   Resp:  16 14   Temp:    98.5 F (36.9 C)  TempSrc:    Oral  SpO2:   96% 96%   Weight: 65.8 kg     Height: 5' 1 (1.549 m)      Eyes: pupils equal, lids and conjunctivae normal ENMT: Mucous membranes are moist. Posterior pharynx  clear of any exudate or lesions.Normal dentition.  Neck: normal, supple, no masses, no thyromegaly Respiratory: diminished no wheezing,Normal respiratory effort. No accessory muscle use.  Cardiovascular: Regular rate and rhythm, no murmurs / rubs / gallops. +edema Abdomen: no tenderness, no masses palpated. No hepatosplenomegaly. Bowel sounds positive.  Musculoskeletal: no clubbing / cyanosis. No joint deformity upper and lower extremities. Good ROM, no contractures. Normal muscle tone.  Skin: no rashes, lesions, ulcers. No induration Neurologic: CN  grossly intact. Sensation intact,  Strength 5/5 in all 4.  Psychiatric: Normal judgment and insight. Alert and oriented x 3. Normal mood.    Labs on Admission: I have personally reviewed following labs and imaging studies  CBC: Recent Labs  Lab 09/15/23 2055  WBC 2.3*  NEUTROABS 1.5*  HGB 7.9*  HCT 26.2*  MCV 98.1  PLT 147*   Basic Metabolic Panel: Recent Labs  Lab 09/15/23 2055  NA 140  K 4.8  CL 110  CO2 19*  GLUCOSE 98  BUN 105*  CREATININE 5.02*  CALCIUM  7.3*   GFR: Estimated Creatinine Clearance: 8.1 mL/min (A) (by C-G formula based on SCr of 5.02 mg/dL (H)). Liver Function Tests: No results for input(s): AST, ALT, ALKPHOS, BILITOT, PROT, ALBUMIN in the last 168 hours. No results for input(s): LIPASE, AMYLASE in the last 168 hours. No results for input(s): AMMONIA in the last 168 hours. Coagulation Profile: No results for input(s): INR, PROTIME in the last 168 hours. Cardiac Enzymes: No results for input(s): CKTOTAL, CKMB, CKMBINDEX, TROPONINI in the last 168 hours. BNP (last 3 results) No results for input(s): PROBNP in the last 8760 hours. HbA1C: No results for input(s): HGBA1C in the last 72 hours. CBG: No results  for input(s): GLUCAP in the last 168 hours. Lipid Profile: No results for input(s): CHOL, HDL, LDLCALC, TRIG, CHOLHDL, LDLDIRECT in the last 72 hours. Thyroid  Function Tests: No results for input(s): TSH, T4TOTAL, FREET4, T3FREE, THYROIDAB in the last 72 hours. Anemia Panel: No results for input(s): VITAMINB12, FOLATE, FERRITIN, TIBC, IRON , RETICCTPCT in the last 72 hours. Urine analysis:    Component Value Date/Time   COLORURINE STRAW (A) 08/30/2022 2134   APPEARANCEUR CLEAR 08/30/2022 2134   APPEARANCEUR Cloudy (A) 08/27/2022 1115   LABSPEC 1.008 08/30/2022 2134   PHURINE 5.0 08/30/2022 2134   GLUCOSEU NEGATIVE 08/30/2022 2134   HGBUR NEGATIVE 08/30/2022 2134   BILIRUBINUR NEGATIVE 08/30/2022 2134   BILIRUBINUR Negative 08/27/2022 1115   BILIRUBINUR Negative 08/27/2022 1115   KETONESUR NEGATIVE 08/30/2022 2134   PROTEINUR 30 (A) 08/30/2022 2134   UROBILINOGEN 0.2 08/27/2022 1115   UROBILINOGEN 0.2 03/06/2013 0519   NITRITE NEGATIVE 08/30/2022 2134   LEUKOCYTESUR MODERATE (A) 08/30/2022 2134    Radiological Exams on Admission: DG Chest 2 View Result Date: 09/15/2023 CLINICAL DATA:  Shortness of breath EXAM: CHEST - 2 VIEW COMPARISON:  Chest x-ray 09/01/2022 FINDINGS: The heart is enlarged, unchanged. Small bilateral pleural effusions are present. There is central pulmonary vascular congestion. There is a band of atelectasis in the right mid lung. There is no pneumothorax or acute fracture. IMPRESSION: Cardiomegaly with central pulmonary vascular congestion and small bilateral pleural effusions. Electronically Signed   By: Greig Pique M.D.   On: 09/15/2023 21:24    EKG: Independently reviewed.   Assessment/Plan  Acute CHF exacerbation with acute hypoxic respiratory failure -in background of severe Aortic stenosis ,diffuse CAD -initially on NRB was able to be weaned  -lasix  60 mg iv bid  -continue jardiance  -cycle ce, check tsh,  echo in  am  -cardiology consult in am   Aortic stenosis ,severe  -patient per recent noted not candidate for TAVR due to progressive CAD and poor functional status  -was referred to palliative care   CKDIV/V -progressive  -per notes not a candidate for HD  -followed by Dr Gearline  -currently at baseline  -consult in am to assist with diuresi  Pancytopenia -previously evaluated by hematology  -no obvious cause noted, but it was noted that counts appeared to stabilize without intervention - continue to monitor     CAD moderate disease  -medical management  - resume home regimen ( asa/statin) - no acute findings to suggest active ischemia   AV block  2nd degree - avoid av nodal agents    Seizure d/o -resume AED -zonisamide /luminal   GERD -ppi   HLD -continue crestor     HTN -continue hydralazine , clonidine , amlodipine     Obesity  Class   Anemia -at baseline    TIA -continue asa    DVT prophylaxis: heparin  Code Status: DNR  as discussed per patient wishes in event of cardiac arrest  Family Communication: none at bedside Disposition Plan: patient  expected to be admitted greater than 2 midnights  Consults called:  Nephrology / Cardiology  Admission status:  progressive    Camila DELENA Ned MD [facility]   If 7PM-7AM, please contact night-coverage [email or web address] Password [email or web address]  09/16/2023, 12:23 AM

## 2023-09-16 NOTE — Hospital Course (Addendum)
 44  yof w/ significant medica history including severe aortic stenosis not TAVR candidate, end stage CHFpef- NYHA class III heart failure; diffuse moderate-severe CAD not a CABG candidate, CKD V b/l creat ~ 4 not an HD candidate, pancytopenia,Seizure d/o,GERD, HLD, HTN, obesity, Anemia, TIA , H/o Mobitz type 1, 2nd degree AVB who present to ED for increase lethargy and shortness of breath and hypoxic in 70s for EMS. In the ED, needing nonrebreather.  X-ray with cardiomegaly central venous pulmonary congestion, small bilateral pleural effusions Labs showed BUN 105 creatinine 5.0 BNP> 4500 pancytopenia. IV Lasix  was given and admission was requested CHF team was consulted, patient will need to Dr. Patrcia given patient's end-stage disease continued advise further treatment and recommended palliative care.  Patient seen and examined still on room air feels much improved today still shortness of breath has leg edema Diminished breath sound, bilateral lower leg edema  A/p Acute on chronic CHF, end-stage Acute hypoxic respiratory failure Severe AS not a TAVR candidate Goals of care: Continue supportive care, palliative care has been consulted, cardiology was notified this morning advised palliative care. For now on IV Lasix , supplemental oxygen  Husband and granddaughter at the bedside spoke with patient's daughter on the phone They decided for no CPR okay for intubation, wanting to explore hospice at home [facility] consulted palliative care consulted May need home oxygen, on hospital bed and dmes  CKD stage V: At baseline not felt to be candidate for HD.  Patient uremic lethargic.  Patient provide nephrology as outpatient  CAD moderate disease HLD HTN: Not a CABG candidate on medical management.  Continue home meds  Seizure disorder History of AV block GERD Anemia of chronic renal disease Pancytopenia History of TIA

## 2023-09-16 NOTE — ED Notes (Signed)
 CCMD called.

## 2023-09-16 NOTE — Progress Notes (Signed)
 Heart Failure Navigator Progress Note  Assessed for Heart & Vascular TOC clinic readiness.  Patient does not meet criteria due to Advanced heart Failure team patient of Dr. Rolan. .   Navigator will sign off at this time.   Stephane Haddock, BSN, Scientist, clinical (histocompatibility and immunogenetics) Only

## 2023-09-16 NOTE — Progress Notes (Signed)
 CSW received call from [place] with [facility] - [phone] who reported family wants them for home hospice and is requesting orders. CSW informed RNCM via secure chat.

## 2023-09-16 NOTE — Progress Notes (Signed)
 Patient name added to treatment team by outside group overnight. Discussed case with Dr. Rolan, patient's AHF doctor. He is familiar with case - severe AS not a TAVR candidate, CAD not a CABG candidate, CKD not felt to be HD candidate per prior notes. Here with worsening SOB, progressive CKD, uremia, hypoxia, lethargy. Dr. Rolan reports treatment in this case would be palliative, nothing to offer from cardiac standpoint. I relayed this update to Dr. Christobal. Will remove from consult list but please reach out if we can be of help.

## 2023-09-16 NOTE — Progress Notes (Signed)
 Patient seen and examined personally, I reviewed the chart, history and physical and admission note, done by admitting physician this morning and agree with the same with following addendum.  Please refer to the morning admission note for more detailed plan of care.  Briefly,  78  yof w/ significant medica history including severe aortic stenosis not TAVR candidate, end stage CHFpef- NYHA class III heart failure; diffuse moderate-severe CAD not a CABG candidate, CKD V b/l creat ~ 4 not an HD candidate, pancytopenia,Seizure d/o,GERD, HLD, HTN, obesity, Anemia, TIA , H/o Mobitz type 1, 2nd degree AVB who present to ED for increase lethargy and shortness of breath and hypoxic in 70s for EMS. In the ED, needing nonrebreather.  X-ray with cardiomegaly central venous pulmonary congestion, small bilateral pleural effusions Labs showed BUN 105 creatinine 5.0 BNP> 4500 pancytopenia. IV Lasix  was given and admission was requested CHF team was consulted, patient will need to Dr. Patrcia given patient's end-stage disease continued advise further treatment and recommended palliative care.  Patient seen and examined still on room air feels much improved today still shortness of breath has leg edema Diminished breath sound, bilateral lower leg edema  A/p Acute on chronic CHF, end-stage Acute hypoxic respiratory failure Severe AS not a TAVR candidate Goals of care: Continue supportive care, palliative care has been consulted, cardiology was notified this morning advised palliative care. For now on IV Lasix , supplemental oxygen  Husband and granddaughter at the bedside spoke with patient's daughter on the phone They decided for no CPR okay for intubation, wanting to explore hospice at home [facility] consulted palliative care consulted May need home oxygen, on hospital bed and dmes  CKD stage V: At baseline not felt to be candidate for HD.  Patient uremic lethargic.  Patient provide nephrology as outpatient  CAD  moderate disease HLD HTN: Not a CABG candidate on medical management.  Continue home meds  Seizure disorder History of AV block GERD Anemia of chronic renal disease Pancytopenia History of TIA

## 2023-09-17 ENCOUNTER — Encounter: Admitting: Student

## 2023-09-17 DIAGNOSIS — I5043 Acute on chronic combined systolic (congestive) and diastolic (congestive) heart failure: Secondary | ICD-10-CM | POA: Diagnosis not present

## 2023-09-17 MED ORDER — SENNOSIDES-DOCUSATE SODIUM 8.6-50 MG PO TABS
1.0000 | ORAL_TABLET | Freq: Two times a day (BID) | ORAL | Status: DC
  Administered 2023-09-17 – 2023-09-18 (×3): 1 via ORAL
  Filled 2023-09-17 (×3): qty 1

## 2023-09-17 MED ORDER — POLYETHYLENE GLYCOL 3350 17 G PO PACK: 17.0000 g | PACK | Freq: Every day | ORAL | Status: DC | PRN

## 2023-09-17 NOTE — TOC CM/SW Note (Signed)
 Transition of Care [facility]) - Inpatient Brief Assessment   Patient Details  Name: RUTHEL MARTINE MRN: [id number] Date of Birth: 09-23-1945  Transition of Care [facility]) CM/SW Contact:    Waddell Barnie Rama, RN Phone Number: 09/17/2023, 11:19 AM   Clinical Narrative: Per daughter Apolinar, at the bedside, From home with spouse, has PCP and insurance on file, states has no HH services in place at this time , has electric w/chair at home.  States  she will need ambulance  transport home at [employer] and family is support system, states gets medications from [facility] on [address].  [place], w/chair bound. NCM received call from [facility] with [facility] this am, stating the daughter , Apolinar has reached out to them for home hospice, and they will order her a hospital bed, bedside table and home oxygen.  They will order the DME today, so probably will receive the DME tomorrow.         Transition of Care Asessment: Insurance and Status: Insurance coverage has been reviewed Patient has primary care physician: Yes Home environment has been reviewed: home with spouse Prior level of function:: w/chair bound Prior/Current Home Services: No current home services Social Drivers of Health Review: SDOH reviewed no interventions necessary Readmission risk has been reviewed: Yes Transition of care needs: transition of care needs identified, TOC will continue to follow

## 2023-09-17 NOTE — Progress Notes (Signed)
 PROGRESS NOTE Sherry [name]  FMW:[id number] DOB: [birth year] DOA: 09/15/2023 PCP: Myrna Bitters, DO  Brief Narrative/Hospital Course: 57  yof w/ significant medica history including severe aortic stenosis not TAVR candidate, end stage CHFpef- NYHA class III heart failure; diffuse moderate-severe CAD not a CABG candidate, CKD V b/l creat ~ 4 not an HD candidate, pancytopenia,Seizure d/o,GERD, HLD, HTN, obesity, Anemia, TIA , H/o Mobitz type 1, 2nd degree AVB who present to ED for increase lethargy and shortness of breath and hypoxic in 70s for EMS. In the ED, needing nonrebreather.  X-ray with cardiomegaly central venous pulmonary congestion, small bilateral pleural effusions Labs showed BUN 105 creatinine 5.0 BNP> 4500 pancytopenia. IV Lasix  was given and admission was requested CHF team was consulted, patient will need to Dr. Patrcia given patient's end-stage disease continued advise further treatment and recommended palliative care. Extremities with patient and family at bedside they want to pursue hospice at home no CPR but want intubation if needed TOC consulted to set up palliative care/hospice at home Clinically improved with IV Lasix  At this time discharging home with hospice  Subjective: Seen and examined today Multiple family meds at the bedside Overnight afebrile BP stable  Assessment and plan:  Acute on chronic CHF, end-stage Acute hypoxic respiratory failure Severe AS not a TAVR candidate Goals of care: Continue supportive care, palliative care has been consulted, cardiology was notified this morning advised palliative care. Treated with IV Lasix , supplemental oxygen > overall clinically improved setting of home with hospice May need home oxygen, on hospital bed and dmes  CKD stage V: At baseline not felt to be candidate for HD.  Patient uremic lethargic.  Patient provide nephrology as outpatient  CAD moderate disease HLD HTN: Not a CABG candidate on medical  management.  Continue home meds-w/  amlodipine  10 aspirin  81 clonidine  0.1 twice daily, hydralazine , Crestor   Seizure disorder Continue her zonisamide , phenobarbital   History of AV block Stable  GERD Continue PPI  Anemia of chronic renal disease: Stable  Pancytopenia: Monitor  History of TIA: Continue aspirin  Class I Obesity w/ Body mass index is 33.66 kg/m.: Will benefit with PCP follow-up, weight loss,healthy lifestyle and outpatient sleep eval if not done.  DVT prophylaxis: heparin  injection 5,000 Units Start: 09/16/23 0600 Code Status:   Code Status: Do not attempt resuscitation (DNR) PRE-ARREST INTERVENTIONS DESIRED Family Communication: plan of care discussed with patient/family at bedside. Patient status is: Remains hospitalized because of severity of illness Level of care: Progressive   Dispo: The patient is from: home            Anticipated disposition: Home with hospice once set up including hospital bed oxygen and other equipments.  Objective: Vitals last 24 hrs: Vitals:   09/17/23 0400 09/17/23 0558 09/17/23 0600 09/17/23 0700  BP: (!) 168/83 (!) 169/84 (!) 169/84 (!) 166/113  Pulse: 67   72  Resp: 15   15  Temp: 97.8 F (36.6 C)   97.9 F (36.6 C)  TempSrc: Oral   Oral  SpO2: 93%   97%  Weight: 80.8 kg     Height:        Physical Examination: General exam: alert awake, oriented, older than stated age HEENT:Oral mucosa moist, Ear/Nose WNL grossly Respiratory system: Bilaterally clear BS,no use of accessory muscle Cardiovascular system: S1 & S2 +, No JVD. Gastrointestinal system: Abdomen soft,NT,ND, BS+ Nervous System: Alert, awake, moving all extremities,and following commands. Extremities: LE edema ++, distal extremities warm.  Skin: No rashes,no icterus. MSK: Normal  muscle bulk,tone, power     Data Reviewed: I have personally reviewed following labs and imaging studies ( see epic result tab) CBC: Recent Labs  Lab 09/15/23 2055  09/16/23 0126  WBC 2.3* 2.2*  NEUTROABS 1.5*  --   HGB 7.9* 7.9*  HCT 26.2* 26.2*  MCV 98.1 98.1  PLT 147* 144*   CMP: Recent Labs  Lab 09/15/23 2055 09/16/23 0126  NA 140 140  K 4.8 4.8  CL 110 108  CO2 19* 18*  GLUCOSE 98 85  BUN 105* 104*  CREATININE 5.02* 5.00*  CALCIUM  7.3* 7.3*   GFR: Estimated Creatinine Clearance: 9.1 mL/min (A) (by C-G formula based on SCr of 5 mg/dL (H)). Recent Labs  Lab 09/16/23 0126  AST 39  ALT 20  ALKPHOS 94  BILITOT 0.6  PROT 5.6*  ALBUMIN 2.6*   No results for input(s): LIPASE, AMYLASE in the last 168 hours. No results for input(s): AMMONIA in the last 168 hours. Coagulation Profile: No results for input(s): INR, PROTIME in the last 168 hours. Unresulted Labs (From admission, onward)    None      Antimicrobials/Microbiology: Anti-infectives (From admission, onward)    None         Component Value Date/Time   SDES BLOOD RIGHT ANTECUBITAL 02/01/2020 0429   SPECREQUEST  02/01/2020 0429    BOTTLES DRAWN AEROBIC AND ANAEROBIC Blood Culture adequate volume   CULT  02/01/2020 0429    NO GROWTH 5 DAYS Performed at [facility], [address], [place], [place] [address]    REPTSTATUS 02/06/2020 FINAL 02/01/2020 0429     Medications reviewed:  Scheduled Meds:  amLODipine   10 mg Oral Daily   aspirin   81 mg Oral Daily   calcium  carbonate  1 tablet Oral TID WC   cholecalciferol   1,000 Units Oral Daily   cloNIDine   0.1 mg Oral BID   empagliflozin   10 mg Oral QAC breakfast   febuxostat   20 mg Oral Daily   furosemide   60 mg Intravenous Q12H   heparin   5,000 Units Subcutaneous Q8H   hydrALAZINE   100 mg Oral Q8H   multivitamin  1 tablet Oral QHS   pantoprazole   40 mg Oral Daily   PHENobarbital   129.6 mg Oral QHS   potassium chloride  SA  20 mEq Oral Daily   rosuvastatin   40 mg Oral Daily   senna-docusate  1 tablet Oral BID   sevelamer  carbonate  800 mg Oral TID WC   zonisamide   100 mg Oral QHS   Continuous  Infusions:  Mennie LAMY, MD [facility] 09/17/2023, 9:46 AM

## 2023-09-18 ENCOUNTER — Encounter: Admitting: Student

## 2023-09-18 DIAGNOSIS — I5043 Acute on chronic combined systolic (congestive) and diastolic (congestive) heart failure: Secondary | ICD-10-CM | POA: Diagnosis not present

## 2023-09-18 NOTE — TOC Transition Note (Signed)
 Transition of Care [facility]) - Discharge Note   Patient Details  Name: Sherry [name] MRN: [id number] Date of Birth: 22-Jun-1945  [facility]) CM/SW Contact:  Waddell Barnie Rama, RN Phone Number: 09/18/2023, 1:40 PM   Clinical Narrative:    For dc today, ptar has been scheduled for 2:30 pickup, the DME will not be at the house until 2 pm today per daughter, Apolinar.           Patient Goals and CMS Choice            Discharge Placement                       Discharge Plan and Services Additional resources added to the After Visit Summary for                                       Social Drivers of Health (SDOH) Interventions SDOH Screenings   Food Insecurity: No Food Insecurity (09/16/2023)  Housing: Low Risk  (09/16/2023)  Transportation Needs: Unmet Transportation Needs (05/26/2023)  Utilities: Not At Risk (03/05/2023)  Alcohol Screen: Low Risk  (09/18/2022)  Depression (PHQ2-9): Low Risk  (03/20/2023)  Financial Resource Strain: Low Risk  (09/18/2022)  Physical Activity: Inactive (09/18/2022)  Social Connections: Moderately Isolated (02/25/2023)  Stress: No Stress Concern Present (09/18/2022)  Tobacco Use: Medium Risk (09/15/2023)  Health Literacy: Adequate Health Literacy (09/18/2022)     Readmission Risk Interventions    09/17/2023   11:18 AM  Readmission Risk Prevention Plan  Transportation Screening Complete  PCP or Specialist Appt within 5-7 Days Complete  Home Care Screening Complete  Medication Review (RN CM) Complete

## 2023-09-18 NOTE — Plan of Care (Signed)
  Problem: Education: Goal: Knowledge of General Education information will improve Description: Including pain rating scale, medication(s)/side effects and non-pharmacologic comfort measures Outcome: Progressing   Problem: Clinical Measurements: Goal: Will remain free from infection Outcome: Progressing   Problem: Nutrition: Goal: Adequate nutrition will be maintained Outcome: Progressing   Problem: Coping: Goal: Level of anxiety will decrease Outcome: Progressing   Problem: Elimination: Goal: Will not experience complications related to bowel motility Outcome: Progressing Goal: Will not experience complications related to urinary retention Outcome: Progressing   Problem: Pain Managment: Goal: General experience of comfort will improve and/or be controlled Outcome: Progressing   Problem: Safety: Goal: Ability to remain free from injury will improve Outcome: Progressing   Problem: Skin Integrity: Goal: Risk for impaired skin integrity will decrease Outcome: Progressing   Problem: Education: Goal: Ability to demonstrate management of disease process will improve Outcome: Progressing Goal: Ability to verbalize understanding of medication therapies will improve Outcome: Progressing   Problem: Health Behavior/Discharge Planning: Goal: Ability to manage health-related needs will improve Outcome: Not Progressing   Problem: Clinical Measurements: Goal: Ability to maintain clinical measurements within normal limits will improve Outcome: Not Progressing Goal: Diagnostic test results will improve Outcome: Not Progressing Goal: Respiratory complications will improve Outcome: Not Progressing Goal: Cardiovascular complication will be avoided Outcome: Not Progressing   Problem: Activity: Goal: Risk for activity intolerance will decrease Outcome: Not Progressing   Problem: Activity: Goal: Capacity to carry out activities will improve Outcome: Not Progressing   Problem:  Cardiac: Goal: Ability to achieve and maintain adequate cardiopulmonary perfusion will improve Outcome: Not Progressing

## 2023-09-18 NOTE — Discharge Summary (Signed)
 Physician Discharge Summary  Sherry [name] FMW:[id number] DOB: [birth year] DOA: 09/15/2023  PCP: Myrna Bitters, DO  Admit date: 09/15/2023 Discharge date: 09/18/2023 Recommendations for Outpatient Follow-up:  Follow up with PCP in 1 weeks-call for appointment Please obtain BMP/CBC in one week  Discharge Dispo: home /w hospice Discharge Condition: Stable Code Status:   Code Status: Do not attempt resuscitation (DNR) PRE-ARREST INTERVENTIONS DESIRED Diet recommendation:  Diet Order             Diet Heart Room service appropriate? Yes; Fluid consistency: Thin  Diet effective now                    Brief/Interim Summary: 48  yof w/ significant medica history including severe aortic stenosis not TAVR candidate, end stage CHFpef- NYHA class III heart failure; diffuse moderate-severe CAD not a CABG candidate, CKD V b/l creat ~ 4 not an HD candidate, pancytopenia,Seizure d/o,GERD, HLD, HTN, obesity, Anemia, TIA , H/o Mobitz type 1, 2nd degree AVB who present to ED for increase lethargy and shortness of breath and hypoxic in 70s for EMS. In the ED, needing nonrebreather.  X-ray with cardiomegaly central venous pulmonary congestion, small bilateral pleural effusions Labs showed BUN 105 creatinine 5.0 BNP> 4500 pancytopenia. IV Lasix  was given and admission was requested CHF team was consulted, patient will need to Dr. Patrcia given patient's end-stage disease continued advise further treatment and recommended palliative care. Extremities with patient and family at bedside they want to pursue hospice at home no CPR but want intubation if needed TOC consulted to set up palliative care/hospice at home Clinically improved with IV Lasix  At this time discharging home with hospice  Subjective: Seen and examined today Multiple family meds at the bedside Overnight afebrile BP stable  Discharge Diagnoses:  Acute on chronic CHF, end-stage Acute hypoxic respiratory failure Severe AS not a TAVR  candidate Goals of care: Continue supportive care, palliative care has been consulted, cardiology was notified this morning advised palliative care. Treated with IV Lasix , supplemental oxygen > overall clinically improved now plan for home with hospice May need home oxygen, on hospital bed and dmes  CKD stage V: At baseline not felt to be candidate for HD.  Patient uremic lethargic.  Patient provide nephrology as outpatient  CAD moderate disease HLD HTN: Not a CABG candidate on medical management.  Continue home meds-w/  amlodipine  10 aspirin  81 clonidine  0.1 twice daily, hydralazine , Crestor   Seizure disorder Continue her zonisamide , phenobarbital   History of AV block Stable  GERD Continue PPI  Anemia of chronic renal disease: Stable  Pancytopenia: Monitor  History of TIA: Continue aspirin  Class I Obesity w/ Body mass index is 33.82 kg/m.: Will benefit with PCP follow-up, weight loss,healthy lifestyle and outpatient sleep eval if not done.  DVT prophylaxis: heparin  injection 5,000 Units Start: 09/16/23 0600 Code Status:   Code Status: Do not attempt resuscitation (DNR) PRE-ARREST INTERVENTIONS DESIRED Family Communication: plan of care discussed with patient/family at bedside. Patient status is: Remains hospitalized because of severity of illness Level of care: Progressive   Dispo: The patient is from: home            Anticipated disposition: Home with hospice once set up including hospital bed oxygen and other equipments.  Objective: Vitals last 24 hrs: Vitals:   09/18/23 0035 09/18/23 0432 09/18/23 0610 09/18/23 0741  BP: (!) 156/76 (!) 153/73 (!) 152/96 135/66  Pulse: 79 72  81  Resp: 20 20  19   Temp: 98.4 F (  36.9 C) 98.4 F (36.9 C)  98.2 F (36.8 C)  TempSrc: Oral Oral  Oral  SpO2: 95% 93%  91%  Weight:  81.2 kg    Height:        Physical Examination: General exam: alert awake, oriented x3, on RA HEENT:Oral mucosa moist, Ear/Nose WNL  grossly Respiratory system: Bilaterally clear BS,no use of accessory muscle Cardiovascular system: S1 & S2 +, No JVD. Gastrointestinal system: Abdomen soft,NT,ND, BS+ Nervous System: Alert, awake, moving all extremities,and following commands. Extremities: LE edema ++, distal extremities warm.  Skin: No rashes,no icterus. MSK: Normal muscle bulk,tone, power      Discharge Instructions  Discharge Instructions     Discharge instructions   Complete by: As directed    Please call call MD or return to ER for similar or worsening recurring problem that brought you to hospital or if any fever,nausea/vomiting,abdominal pain, uncontrolled pain, chest pain,  shortness of breath or any other alarming symptoms.  Please follow-up your doctor as instructed in a week time and call the office for appointment.  Please avoid alcohol, smoking, or any other illicit substance and maintain healthy habits including taking your regular medications as prescribed.  You were cared for by a hospitalist during your hospital stay. If you have any questions about your discharge medications or the care you received while you were in the hospital after you are discharged, you can call the unit and ask to speak with the hospitalist on call if the hospitalist that took care of you is not available.  Once you are discharged, your primary care physician will handle any further medical issues. Please note that NO REFILLS for any discharge medications will be authorized once you are discharged, as it is imperative that you return to your primary care physician (or establish a relationship with a primary care physician if you do not have one) for your aftercare needs so that they can reassess your need for medications and monitor your lab values   Increase activity slowly   Complete by: As directed       Allergies as of 09/18/2023       Reactions   Ibuprofen Other (See Comments)   Dr advised pt not to take ibuprofen    Lactose Intolerance (gi) Diarrhea        Medication List     TAKE these medications    acetaminophen  325 MG tablet Commonly known as: TYLENOL  Take 2 tablets (650 mg total) by mouth every 6 (six) hours as needed for mild pain (or Fever >/= 101). What changed: reasons to take this   albuterol  (2.5 MG/3ML) 0.083% nebulizer solution Commonly known as: PROVENTIL  Take 3 mLs (2.5 mg total) by nebulization every 6 (six) hours as needed for wheezing.   amLODipine  10 MG tablet Commonly known as: NORVASC  Take 1 tablet (10 mg total) by mouth daily.   aspirin  81 MG chewable tablet Chew 1 tablet (81 mg total) by mouth daily.   calcium  carbonate 1500 (600 Ca) MG Tabs tablet Commonly known as: OSCAL Take 1 tablet (1,500 mg total) by mouth 3 (three) times daily with meals.   cholecalciferol  25 MCG (1000 UNIT) tablet Commonly known as: VITAMIN D3 Take 1 tablet (1,000 Units total) by mouth daily.   cloNIDine  0.1 MG tablet Commonly known as: CATAPRES  Take 1 tablet (0.1 mg total) by mouth 2 (two) times daily.   empagliflozin  10 MG Tabs tablet Commonly known as: Jardiance  Take 1 tablet (10 mg total) by mouth daily  before breakfast.   febuxostat  40 MG tablet Commonly known as: ULORIC  Take 0.5 tablets (20 mg total) by mouth daily.   hydrALAZINE  100 MG tablet Commonly known as: APRESOLINE  TAKE 1 TABLET(100 MG) BY MOUTH THREE TIMES DAILY What changed: See the new instructions.   lidocaine  4 % cream Commonly known as: LMX Apply 1 Application topically 2 (two) times daily as needed (Pain on sacral wound area).   multivitamin Tabs tablet Take 1 tablet by mouth at bedtime.   pantoprazole  40 MG tablet Commonly known as: PROTONIX  TAKE 1 TABLET BY MOUTH DAILY   PHENobarbital  64.8 MG tablet Commonly known as: LUMINAL Take 2 tablets (129.6 mg total) by mouth at bedtime.   potassium chloride  SA 20 MEQ tablet Commonly known as: KLOR-CON  M Take 1 tablet (20 mEq total) by mouth daily.    rosuvastatin  40 MG tablet Commonly known as: CRESTOR  Take 1 tablet (40 mg total) by mouth daily.   sevelamer  carbonate 800 MG tablet Commonly known as: RENVELA  Take 1 tablet (800 mg total) by mouth 3 (three) times daily with meals.   torsemide  20 MG tablet Commonly known as: DEMADEX  Take 80 mg daily every other day ALTERNATING with 60 mg daily every other day   zonisamide  100 MG capsule Commonly known as: ZONEGRAN  TAKE 1 CAPSULE BY MOUTH AT NIGHT        Follow-up Information     Myrna Bitters, DO Follow up in 1 week(s).   Specialty: Internal Medicine Contact information: [address] [place] [address] [phone]         [facility] Follow up.   Why: home hospice Contact information: : [address], [place], [place] [address] [phone]               Allergies  Allergen Reactions   Ibuprofen Other (See Comments)    Dr advised pt not to take ibuprofen   Lactose Intolerance (Gi) Diarrhea    The results of significant diagnostics from this hospitalization (including imaging, microbiology, ancillary and laboratory) are listed below for reference.    Microbiology: Recent Results (from the past 240 hours)  Resp panel by RT-PCR (RSV, Flu A&B, Covid) Anterior Nasal Swab     Status: None   Collection Time: 09/15/23 10:16 PM   Specimen: Anterior Nasal Swab  Result Value Ref Range Status   SARS Coronavirus 2 by RT PCR NEGATIVE NEGATIVE Final   Influenza A by PCR NEGATIVE NEGATIVE Final   Influenza B by PCR NEGATIVE NEGATIVE Final    Comment: (NOTE) The Xpert Xpress SARS-CoV-2/FLU/RSV plus assay is intended as an aid in the diagnosis of influenza from Nasopharyngeal swab specimens and should not be used as a sole basis for treatment. Nasal washings and aspirates are unacceptable for Xpert Xpress SARS-CoV-2/FLU/RSV testing.  Fact Sheet for Patients: BloggerCourse.com  Fact Sheet for  Healthcare Providers: SeriousBroker.it  This test is not yet approved or cleared by the [place]  FDA and has been authorized for detection and/or diagnosis of SARS-CoV-2 by FDA under an Emergency Use Authorization (EUA). This EUA will remain in effect (meaning this test can be used) for the duration of the COVID-19 declaration under Section 564(b)(1) of the Act, 21 U.S.C. section 360bbb-3(b)(1), unless the authorization is terminated or revoked.     Resp Syncytial Virus by PCR NEGATIVE NEGATIVE Final    Comment: (NOTE) Fact Sheet for Patients: BloggerCourse.com  Fact Sheet for Healthcare Providers: SeriousBroker.it  This test is  not yet approved or cleared by the [place]  FDA and has been authorized for detection and/or diagnosis of SARS-CoV-2 by FDA under an Emergency Use Authorization (EUA). This EUA will remain in effect (meaning this test can be used) for the duration of the COVID-19 declaration under Section 564(b)(1) of the Act, 21 U.S.C. section 360bbb-3(b)(1), unless the authorization is terminated or revoked.  Performed at [facility], [address], [place], [place] [address]     Procedures/Studies: DG Chest 2 View Result Date: 09/15/2023 CLINICAL DATA:  Shortness of breath EXAM: CHEST - 2 VIEW COMPARISON:  Chest x-ray 09/01/2022 FINDINGS: The heart is enlarged, unchanged. Small bilateral pleural effusions are present. There is central pulmonary vascular congestion. There is a band of atelectasis in the right mid lung. There is no pneumothorax or acute fracture. IMPRESSION: Cardiomegaly with central pulmonary vascular congestion and small bilateral pleural effusions. Electronically Signed   By: Greig Pique M.D.   On: 09/15/2023 21:24    Labs: BNP (last 3 results) Recent Labs    05/12/23 1133 07/25/23 1123 09/15/23 2055  BNP 1,317.5* 2,003.5* >4,500.0*   Basic Metabolic  Panel: Recent Labs  Lab 09/15/23 2055 09/16/23 0126  NA 140 140  K 4.8 4.8  CL 110 108  CO2 19* 18*  GLUCOSE 98 85  BUN 105* 104*  CREATININE 5.02* 5.00*  CALCIUM  7.3* 7.3*   Liver Function Tests: Recent Labs  Lab 09/16/23 0126  AST 39  ALT 20  ALKPHOS 94  BILITOT 0.6  PROT 5.6*  ALBUMIN 2.6*   No results for input(s): LIPASE, AMYLASE in the last 168 hours. No results for input(s): AMMONIA in the last 168 hours. CBC: Recent Labs  Lab 09/15/23 2055 09/16/23 0126  WBC 2.3* 2.2*  NEUTROABS 1.5*  --   HGB 7.9* 7.9*  HCT 26.2* 26.2*  MCV 98.1 98.1  PLT 147* 144*  Urinalysis    Component Value Date/Time   COLORURINE STRAW (A) 08/30/2022 2134   APPEARANCEUR CLEAR 08/30/2022 2134   APPEARANCEUR Cloudy (A) 08/27/2022 1115   LABSPEC 1.008 08/30/2022 2134   PHURINE 5.0 08/30/2022 2134   GLUCOSEU NEGATIVE 08/30/2022 2134   HGBUR NEGATIVE 08/30/2022 2134   BILIRUBINUR NEGATIVE 08/30/2022 2134   BILIRUBINUR Negative 08/27/2022 1115   BILIRUBINUR Negative 08/27/2022 1115   KETONESUR NEGATIVE 08/30/2022 2134   PROTEINUR 30 (A) 08/30/2022 2134   UROBILINOGEN 0.2 08/27/2022 1115   UROBILINOGEN 0.2 03/06/2013 0519   NITRITE NEGATIVE 08/30/2022 2134   LEUKOCYTESUR MODERATE (A) 08/30/2022 2134   Sepsis Labs Recent Labs  Lab 09/15/23 2055 09/16/23 0126  WBC 2.3* 2.2*   Microbiology Recent Results (from the past 240 hours)  Resp panel by RT-PCR (RSV, Flu A&B, Covid) Anterior Nasal Swab     Status: None   Collection Time: 09/15/23 10:16 PM   Specimen: Anterior Nasal Swab  Result Value Ref Range Status   SARS Coronavirus 2 by RT PCR NEGATIVE NEGATIVE Final   Influenza A by PCR NEGATIVE NEGATIVE Final   Influenza B by PCR NEGATIVE NEGATIVE Final    Comment: (NOTE) The Xpert Xpress SARS-CoV-2/FLU/RSV plus assay is intended as an aid in the diagnosis of influenza from Nasopharyngeal swab specimens and should not be used as a sole basis for treatment. Nasal  washings and aspirates are unacceptable for Xpert Xpress SARS-CoV-2/FLU/RSV testing.  Fact Sheet for Patients: BloggerCourse.com  Fact Sheet for Healthcare Providers: SeriousBroker.it  This test is not yet approved or cleared by the [place]  FDA  and has been authorized for detection and/or diagnosis of SARS-CoV-2 by FDA under an Emergency Use Authorization (EUA). This EUA will remain in effect (meaning this test can be used) for the duration of the COVID-19 declaration under Section 564(b)(1) of the Act, 21 U.S.C. section 360bbb-3(b)(1), unless the authorization is terminated or revoked.     Resp Syncytial Virus by PCR NEGATIVE NEGATIVE Final    Comment: (NOTE) Fact Sheet for Patients: BloggerCourse.com  Fact Sheet for Healthcare Providers: SeriousBroker.it  This test is not yet approved or cleared by the [place]  FDA and has been authorized for detection and/or diagnosis of SARS-CoV-2 by FDA under an Emergency Use Authorization (EUA). This EUA will remain in effect (meaning this test can be used) for the duration of the COVID-19 declaration under Section 564(b)(1) of the Act, 21 U.S.C. section 360bbb-3(b)(1), unless the authorization is terminated or revoked.  Performed at [facility], [address], [place], [place] [address]    Time coordinating discharge: 35  minutes  SIGNED: Mennie LAMY, MD  [facility] 09/18/2023, 9:37 AM  If 7PM-7AM, please contact night-coverage [email or web address]

## 2023-09-18 NOTE — TOC Progression Note (Signed)
 Transition of Care [facility]) - Progression Note    Patient Details  Name: Sherry [name] MRN: [id number] Date of Birth: [birth year]  Transition of Care [facility]) CM/SW Contact  Waddell Barnie Rama, RN Phone Number: 09/18/2023, 10:00 AM  Clinical Narrative:    NCM spoke with Apolin[facility], she states , [facility] is working on the DME now. The DME has not been delivered yet, Apolinar will call this NCM when it has been delivered.                      Expected Discharge Plan and Services         Expected Discharge Date: 09/18/23                                     Social Drivers of Health (SDOH) Interventions SDOH Screenings   Food Insecurity: No Food Insecurity (09/16/2023)  Housing: Low Risk  (09/16/2023)  Transportation Needs: Unmet Transportation Needs (05/26/2023)  Utilities: Not At Risk (03/05/2023)  Alcohol Screen: Low Risk  (09/18/2022)  Depression (PHQ2-9): Low Risk  (03/20/2023)  Financial Resource Strain: Low Risk  (09/18/2022)  Physical Activity: Inactive (09/18/2022)  Social Connections: Moderately Isolated (02/25/2023)  Stress: No Stress Concern Present (09/18/2022)  Tobacco Use: Medium Risk (09/15/2023)  Health Literacy: Adequate Health Literacy (09/18/2022)    Readmission Risk Interventions    09/17/2023   11:18 AM  Readmission Risk Prevention Plan  Transportation Screening Complete  PCP or Specialist Appt within 5-7 Days Complete  Home Care Screening Complete  Medication Review (RN CM) Complete

## 2023-09-18 NOTE — Plan of Care (Signed)
  Problem: Clinical Measurements: Goal: Will remain free from infection Outcome: Progressing Goal: Respiratory complications will improve Outcome: Progressing Goal: Cardiovascular complication will be avoided Outcome: Progressing   Problem: Elimination: Goal: Will not experience complications related to urinary retention Outcome: Progressing   Problem: Pain Managment: Goal: General experience of comfort will improve and/or be controlled Outcome: Progressing   Problem: Safety: Goal: Ability to remain free from injury will improve Outcome: Progressing   Problem: Skin Integrity: Goal: Risk for impaired skin integrity will decrease Outcome: Progressing

## 2023-09-24 ENCOUNTER — Ambulatory Visit: Payer: 59

## 2023-09-24 ENCOUNTER — Telehealth: Payer: Self-pay

## 2023-09-24 VITALS — Ht 61.0 in | Wt 179.0 lb

## 2023-09-24 DIAGNOSIS — Z0000 Encounter for general adult medical examination without abnormal findings: Secondary | ICD-10-CM | POA: Diagnosis not present

## 2023-09-24 NOTE — Telephone Encounter (Signed)
 Daughter is inquiring about the incontinence supplies.  She has not heard anything from [facility].  Patient is running out of supplies.  Cortina Vultaggio N. Tomie, LPN [employer]

## 2023-09-24 NOTE — Patient Instructions (Signed)
 Ms. Owensby , Thank you for taking time out of your busy schedule to complete your Annual Wellness Visit with me. I enjoyed our conversation and look forward to speaking with you again next year. I, as well as your care team,  appreciate your ongoing commitment to your health goals. Please review the following plan we discussed and let me know if I can assist you in the future. Your Game plan/ To Do List    Referrals: If you haven't heard from the office you've been referred to, please reach out to them at the phone provided.   Follow up Visits: We will see or speak with you next year for your Next Medicare AWV with our clinical staff Have you seen your provider in the last 6 months (3 months if uncontrolled diabetes)? Yes  Clinician Recommendations:  Aim for 30 minutes of exercise or brisk walking, 6-8 glasses of water, and 5 servings of fruits and vegetables each day.       This is a list of the screenings recommended for you:  Health Maintenance  Topic Date Due   DTaP/Tdap/Td vaccine (2 - Tdap) 09/19/2019   COVID-19 Vaccine (3 - Pfizer risk series) 12/30/2019   Zoster (Shingles) Vaccine (2 of 2) 06/17/2022   Flu Shot  09/19/2023   Medicare Annual Wellness Visit  09/23/2024   Pneumococcal Vaccine for age over 61  Completed   DEXA scan (bone density measurement)  Completed   Hepatitis C Screening  Completed   Hepatitis B Vaccine  Aged Out   HPV Vaccine  Aged Out   Meningitis B Vaccine  Aged Out   Colon Cancer Screening  Discontinued    Advanced directives: (Declined) Advance directive discussed with you today. Even though you declined this today, please call our office should you change your mind, and we can give you the proper paperwork for you to fill out. Advance Care Planning is important because it:  [x]  Makes sure you receive the medical care that is consistent with your values, goals, and preferences  [x]  It provides guidance to your family and loved ones and reduces their  decisional burden about whether or not they are making the right decisions based on your wishes.  Follow the link provided in your after visit summary or read over the paperwork we have mailed to you to help you started getting your Advance Directives in place. If you need assistance in completing these, please reach out to us  so that we can help you!  See attachments for Preventive Care and Fall Prevention Tips.

## 2023-09-24 NOTE — Progress Notes (Signed)
 Because this visit was a virtual/telehealth visit,  certain criteria was not obtained, such a blood pressure, CBG if applicable, and timed get up and go. Any medications not marked as taking were not mentioned during the medication reconciliation part of the visit. Any vitals not documented were not able to be obtained due to this being a telehealth visit or patient was unable to self-report a recent blood pressure reading due to a lack of equipment at home via telehealth. Vitals that have been documented are verbally provided by the patient.   Subjective:   Sherry [name] is a 78 y.o. who presents for a Medicare Wellness preventive visit.  As a reminder, Annual Wellness Visits don't include a physical exam, and some assessments may be limited, especially if this visit is performed virtually. We may recommend an in-person follow-up visit with your provider if needed.  Visit Complete: Virtual I connected with  Sherry [name] on 09/24/23 by a audio enabled telemedicine application and verified that I am speaking with the correct person using two identifiers.  Patient Location: Home  Provider Location: Home Office  I discussed the limitations of evaluation and management by telemedicine. The patient expressed understanding and agreed to proceed.  Vital Signs: Because this visit was a virtual/telehealth visit, some criteria may be missing or patient reported. Any vitals not documented were not able to be obtained and vitals that have been documented are patient reported.  VideoError- Librarian, academic were attempted between this provider and patient, however failed, due to patient having technical difficulties OR patient did not have access to video capability.  We continued and completed visit with audio only.   Persons Participating in Visit: Daughter Harriet) and patient was present during visit.  AWV Questionnaire: Yes: Patient Medicare AWV  questionnaire was completed by the patient on 09/23/2023; I have confirmed that all information answered by patient is correct and no changes since this date.  Cardiac Risk Factors include: advanced age (>25men, >54 women);dyslipidemia;hypertension;obesity (BMI >30kg/m2);sedentary lifestyle;family history of premature cardiovascular disease     Objective:    Today's Vitals   09/24/23 0954  Weight: 179 lb (81.2 kg)  Height: 5' 1 (1.549 m)  PainSc: 0-No pain   Body mass index is 33.82 kg/m.     09/24/2023    9:57 AM 09/15/2023    8:25 PM 04/24/2023    9:43 AM 03/02/2023   10:13 AM 02/25/2023   11:00 PM 02/25/2023   10:05 AM 10/29/2022   10:03 AM  Advanced Directives  Does Patient Have a Medical Advance Directive? No No No No No No No  Would patient like information on creating a medical advance directive? No - Patient declined No - Patient declined  No - Patient declined No - Patient declined No - Patient declined No - Patient declined    Current Medications (verified) Outpatient Encounter Medications as of 09/24/2023  Medication Sig   acetaminophen  (TYLENOL ) 325 MG tablet Take 2 tablets (650 mg total) by mouth every 6 (six) hours as needed for mild pain (or Fever >/= 101). (Patient taking differently: Take 650 mg by mouth every 6 (six) hours as needed for mild pain (pain score 1-3) or moderate pain (pain score 4-6) (or Fever >/= 101).)   albuterol  (PROVENTIL ) (2.5 MG/3ML) 0.083% nebulizer solution Take 3 mLs (2.5 mg total) by nebulization every 6 (six) hours as needed for wheezing.   amLODipine  (NORVASC ) 10 MG tablet Take 1 tablet (10 mg total) by mouth daily.  aspirin  81 MG chewable tablet Chew 1 tablet (81 mg total) by mouth daily.   calcium  carbonate (OSCAL) 1500 (600 Ca) MG TABS tablet Take 1 tablet (1,500 mg total) by mouth 3 (three) times daily with meals.   cholecalciferol  (VITAMIN D3) 25 MCG (1000 UNIT) tablet Take 1 tablet (1,000 Units total) by mouth daily.   cloNIDine  (CATAPRES )  0.1 MG tablet Take 1 tablet (0.1 mg total) by mouth 2 (two) times daily.   empagliflozin  (JARDIANCE ) 10 MG TABS tablet Take 1 tablet (10 mg total) by mouth daily before breakfast.   febuxostat  (ULORIC ) 40 MG tablet Take 0.5 tablets (20 mg total) by mouth daily.   hydrALAZINE  (APRESOLINE ) 100 MG tablet TAKE 1 TABLET(100 MG) BY MOUTH THREE TIMES DAILY (Patient taking differently: Take 100 mg by mouth 3 (three) times daily.)   lidocaine  (LMX) 4 % cream Apply 1 Application topically 2 (two) times daily as needed (Pain on sacral wound area).   multivitamin (RENA-VIT) TABS tablet Take 1 tablet by mouth at bedtime.   pantoprazole  (PROTONIX ) 40 MG tablet TAKE 1 TABLET BY MOUTH DAILY   PHENobarbital  (LUMINAL) 64.8 MG tablet Take 2 tablets (129.6 mg total) by mouth at bedtime.   potassium chloride  SA (KLOR-CON  M) 20 MEQ tablet Take 1 tablet (20 mEq total) by mouth daily.   rosuvastatin  (CRESTOR ) 40 MG tablet Take 1 tablet (40 mg total) by mouth daily.   sevelamer  carbonate (RENVELA ) 800 MG tablet Take 1 tablet (800 mg total) by mouth 3 (three) times daily with meals.   torsemide  (DEMADEX ) 20 MG tablet Take 80 mg daily every other day ALTERNATING with 60 mg daily every other day   zonisamide  (ZONEGRAN ) 100 MG capsule TAKE 1 CAPSULE BY MOUTH AT NIGHT   No facility-administered encounter medications on file as of 09/24/2023.    Allergies (verified) Ibuprofen and Lactose intolerance (gi)   History: Past Medical History:  Diagnosis Date   Abdominal pain 02/25/2023   Abscess 10/30/2022   Acute diastolic congestive heart failure (HCC) 08/31/2022   Acute kidney injury superimposed on CKD (HCC) 04/28/2011   AKI (acute kidney injury) (HCC) 01/31/2020   Anemia    Aortic stenosis    Arthritis    Bilateral lower extremity edema    Bilateral lower extremity edema    Blurry vision, bilateral 05/08/2016   Bradycardia    a. 01/2013 - asymptomatic.   Chronic diastolic heart failure (HCC)    Chronic sinus  bradycardia 07/02/2012   CKD (chronic kidney disease)    a. baseline CKD stage III   CKD (chronic kidney disease) stage 4, GFR 15-29 ml/min (HCC) 09/17/2022   Coronary artery disease    Electrolyte abnormality 08/30/2022   Epilepsy (HCC) 06/04/2006   EEG 02/19/12  Interpretation:  This is an abnormal EEG demonstrating continuous and generalized slowing of the background in the theta frequency. Irregular rhythm of EKG noted.  Clinical correlation: the generalized slowing is consistent with a mild encephalopathy of nonspecific etiologies for which the differential would include infectious, toxic, metabolic, inflammatory, and vascular etiologies.    GERD (gastroesophageal reflux disease)    Headache(784.0)    Heart block AV second degree 02/15/2013   Heart murmur    Hyperlipidemia    Hypertension    Hypocalcemia 08/27/2022   Patient with chronic hypocalcemia, found to have a critical calcium  of 5.8 on CMP today.  Albumin 2.7, corrected calcium  6.8.  Likely multifactorial due to CKD, vitamin D  deficiency.  This was discussed extensively with the patient  and family.  Risks and benefits of inpatient management discussed and family decided to continue with current plan of outpatient management with close follow-up.  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 -Orbits: Grossly unremarkable. -Paranasal sinuses: Imaged portions clear. -Brain: No acute abnormalities such as hemorrhage, hydrocephalus, acute ischemia, or evidence of a mass lesion.  There is patchy bilateral cerebral white matter T2 and flair signal hyper intensities.  Centimeter or smaller CSF intensity    Melena 02/25/2023   Mobitz (type) I [facility]) atrioventricular block    a. 01/2013 - asymptomatic.   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   TIA (transient ischemic attack) 2014   pt stated she had mini strokes   UTI (urinary tract infection) 03/07/2022   Vitamin D  deficiency 04/22/2014   Past Surgical History:  Procedure Laterality Date   ABDOMINAL HYSTERECTOMY     BIOPSY  02/28/2023   Procedure: BIOPSY;  Surgeon: Burnette Fallow, MD;  Location: [facility] ENDOSCOPY;  Service: Gastroenterology;;   COLONOSCOPY WITH PROPOFOL  N/A 03/02/2023   Procedure: COLONOSCOPY WITH PROPOFOL ;  Surgeon: Saintclair Jasper, MD;  Location: [facility] ENDOSCOPY;  Service: Gastroenterology;  Laterality: N/A;   ESOPHAGOGASTRODUODENOSCOPY (EGD) WITH PROPOFOL  N/A 02/28/2023   Procedure: ESOPHAGOGASTRODUODENOSCOPY (EGD) WITH PROPOFOL ;  Surgeon: Burnette Fallow, MD;  Location: [facility] ENDOSCOPY;  Service: Gastroenterology;  Laterality: N/A;   LEFT AND RIGHT HEART CATHETERIZATION WITH CORONARY ANGIOGRAM N/A 05/24/2013   Procedure: LEFT AND RIGHT HEART CATHETERIZATION WITH CORONARY ANGIOGRAM;  Surgeon: Victory LELON Claudene DOUGLAS, MD;  Location: [facility] CATH LAB;  Service: Cardiovascular;  Laterality: N/A;   LEFT HEART CATH N/A 02/01/2020   Procedure: Left Heart Cath;  Surgeon: Cherrie Toribio SAUNDERS, MD;  Location: [facility] INVASIVE CV LAB;  Service: Cardiovascular;  Laterality: N/A;   POLYPECTOMY  03/02/2023   Procedure: POLYPECTOMY;  Surgeon: Saintclair Jasper, MD;  Location: [facility] ENDOSCOPY;  Service: Gastroenterology;;   RIGHT HEART CATH N/A 02/01/2020   Procedure: RIGHT HEART CATH;  Surgeon: Cherrie Toribio SAUNDERS, MD;  Location: [facility]  INVASIVE CV LAB;  Service: Cardiovascular;  Laterality: N/A;   Family History  Problem Relation Age of Onset   Hypertension Other    Heart disease Mother    Hypertension Mother    Cancer Father    Obesity Son    Asthma Sister    Heart disease Sister    Heart disease Sister    Social History   Socioeconomic History   Marital status: Married    Spouse name: Johnny   Number of children: 2   Years of education: 4   Highest education level: 7th grade  Occupational History   Occupation: retired  Tobacco Use   Smoking status: Former    Current packs/day: 0.00    Average packs/day: 0.5 packs/day for 10.0 years (5.0 ttl pk-yrs)    Types: Cigarettes  Start date: 06/04/1990    Quit date: 06/03/2000    Years since quitting: 23.3   Smokeless tobacco: Never  Vaping Use   Vaping status: Never Used  Substance and Sexual Activity   Alcohol use: No    Alcohol/week: 0.0 standard drinks of alcohol   Drug use: No   Sexual activity: Not Currently    Partners: Male  Other Topics Concern   Not on file  Social History Narrative   Current Social History 06/20/2020        Patient lives with spouse in a home which is 1 story. There are not steps up to the entrance, the patient uses a ramp.       Patient's method of transportation is [employer].      The highest level of education was some high school.      The patient currently disabled.      Identified important Relationships are God and my husband       Pets : None       Interests / Fun: Sitting on my front porch       Current Stressors: None        Son passed away.      Social Drivers of Health   Financial Resource Strain: Patient Declined (09/24/2023)   Overall Financial Resource Strain (CARDIA)    Difficulty of Paying Living Expenses: Patient declined  Food Insecurity: Food Insecurity Present (09/24/2023)   Hunger Vital Sign    Worried About Running Out of Food in the Last Year: Sometimes true    Ran Out of Food in the Last  Year: Patient declined  Transportation Needs: No Transportation Needs (09/24/2023)   PRAPARE - Administrator, Civil Service (Medical): No    Lack of Transportation (Non-Medical): No  Physical Activity: Inactive (09/24/2023)   Exercise Vital Sign    Days of Exercise per Week: 0 days    Minutes of Exercise per Session: 0 min  Stress: No Stress Concern Present (09/24/2023)   [employer] of Occupational Health - Occupational Stress Questionnaire    Feeling of Stress: Not at all  Social Connections: Moderately Isolated (09/24/2023)   Social Connection and Isolation Panel    Frequency of Communication with Friends and Family: More than three times a week    Frequency of Social Gatherings with Friends and Family: More than three times a week    Attends Religious Services: Never    Database administrator or Organizations: No    Attends Engineer, structural: Never    Marital Status: Married    Tobacco Counseling Counseling given: Not Answered    Clinical Intake:  Pre-visit preparation completed: Yes  Pain : No/denies pain Pain Score: 0-No pain     BMI - recorded: 33.82 Nutritional Status: BMI > 30  Obese Nutritional Risks: None Diabetes: No  Lab Results  Component Value Date   HGBA1C 5.8 01/25/2013     How often do you need to have someone help you when you read instructions, pamphlets, or other written materials from your doctor or pharmacy?: 1 - Never  Interpreter Needed?: No  Information entered by :: Ia Leeb N. Lansing Sigmon, LPN.   Activities of Daily Living     09/24/2023    9:58 AM 09/23/2023    7:07 AM  In your present state of health, do you have any difficulty performing the following activities:  Hearing? 0 0  Vision? 0 0  Difficulty concentrating or making decisions? 1  Comment Memory Problems per daughter   Walking or climbing stairs? 1 1  Dressing or bathing? 1 1  Doing errands, shopping? 1 1  Preparing Food and eating ? Y Y  Using  the Toilet? N N  In the past six months, have you accidently leaked urine? Y Y  Do you have problems with loss of bowel control? Y   Managing your Medications? N N  Managing your Finances? N N  Housekeeping or managing your Housekeeping? CINDERELLA CINDERELLA    Patient Care Team: Myrna Bitters, DO as PCP - General (Internal Medicine) Wonda Sharper, MD as PCP - Cardiology (Cardiology) Georjean Darice HERO, MD as Consulting Physician (Neurology)  I have updated your Care Teams any recent Medical Services you may have received from other providers in the past year.     Assessment:   This is a routine wellness examination for Sherry [name].  Hearing/Vision screen Hearing Screening - Comments:: Denies hearing difficulties   Vision Screening - Comments:: Wears rx glasses - not up to date with routine eye exams.    Goals Addressed             This Visit's Progress    Client understands the importance of follow-up with providers by attending scheduled visits       Prevent Falls         Depression Screen     09/24/2023    9:57 AM 03/20/2023   10:09 AM 02/25/2023   10:04 AM 10/29/2022   10:04 AM 09/18/2022   10:54 AM 06/14/2022   10:17 AM 11/28/2021    9:36 AM  PHQ 2/9 Scores  PHQ - 2 Score 0 0 0 0 0 0 0  PHQ- 9 Score 0   0 1  0  Exception Documentation --          Fall Risk     09/24/2023   10:10 AM 09/23/2023    7:07 AM 04/24/2023    9:43 AM 03/20/2023   10:09 AM 02/25/2023   10:04 AM  Fall Risk   Falls in the past year? 1 1 0 0 0  Number falls in past yr: 0 0 0 0 0  Injury with Fall? 0 0 0 0 0  Risk for fall due to :    Impaired balance/gait Impaired balance/gait  Follow up Falls evaluation completed;Education provided  Falls evaluation completed Falls evaluation completed;Falls prevention discussed Falls prevention discussed;Falls evaluation completed    MEDICARE RISK AT HOME:  Medicare Risk at Home Any stairs in or around the home?: No If so, are there any without handrails?: No Home free of  loose throw rugs in walkways, pet beds, electrical cords, etc?: Yes Adequate lighting in your home to reduce risk of falls?: Yes Life alert?: Yes Use of a cane, walker or w/c?: Yes Grab bars in the bathroom?: Yes Shower chair or bench in shower?: Yes Elevated toilet seat or a handicapped toilet?: No  TIMED UP AND GO:  Was the test performed?  No  Cognitive Function: Daughter stated that patient has memory and concentrating issues.    09/24/2023    9:58 AM  MMSE - Mini Mental State Exam  Not completed: Unable to complete        09/18/2022   10:50 AM 10/17/2021   10:29 AM  6CIT Screen  What Year? 0 points 0 points  What month? 0 points 0 points  What time? 3 points 0 points  Count back from 20 4 points 4 points  Months in reverse 4 points 4 points  Repeat phrase 10 points 2 points  Total Score 21 points 10 points    Immunizations Immunization History  Administered Date(s) Administered   Fluad Quad(high Dose 65+) 10/25/2021   Fluad Trivalent(High Dose 65+) 10/29/2022, 10/29/2022   Influenza Split 04/22/2011, 11/14/2011   Influenza Whole 12/17/2007, 12/19/2009   Influenza, High Dose Seasonal PF 11/11/2018   Influenza,inj,Quad PF,6+ Mos 10/31/2012, 10/11/2015, 12/09/2016, 12/03/2017, 10/27/2019, 10/26/2020   PFIZER(Purple Top)SARS-COV-2 Vaccination 11/11/2019, 12/02/2019   PNEUMOCOCCAL CONJUGATE-20 10/25/2021   Pneumococcal Conjugate-13 06/06/2014   Pneumococcal Polysaccharide-23 06/24/2011, 10/31/2012   Td 09/18/2009   Zoster Recombinant(Shingrix) 04/22/2022    Screening Tests Health Maintenance  Topic Date Due   DTaP/Tdap/Td (2 - Tdap) 09/19/2019   COVID-19 Vaccine (3 - Pfizer risk series) 12/30/2019   Zoster Vaccines- Shingrix (2 of 2) 06/17/2022   INFLUENZA VACCINE  09/19/2023   Medicare Annual Wellness (AWV)  09/23/2024   Pneumococcal Vaccine: 50+ Years  Completed   DEXA SCAN  Completed   Hepatitis C Screening  Completed   Hepatitis B Vaccines  Aged Out    HPV VACCINES  Aged Out   Meningococcal B Vaccine  Aged Out   Colonoscopy  Discontinued    Health Maintenance  Health Maintenance Due  Topic Date Due   DTaP/Tdap/Td (2 - Tdap) 09/19/2019   COVID-19 Vaccine (3 - Pfizer risk series) 12/30/2019   Zoster Vaccines- Shingrix (2 of 2) 06/17/2022   INFLUENZA VACCINE  09/19/2023   Health Maintenance Items Addressed: Yes Patient is due for the following vaccines: Shingrix, Flu, DTaP and Covid-19.  Additional Screening:  Vision Screening: Recommended annual ophthalmology exams for early detection of glaucoma and other disorders of the eye. Would you like a referral to an eye doctor? No    Dental Screening: Recommended annual dental exams for proper oral hygiene  Community Resource Referral / Chronic Care Management: CRR required this visit?  No   CCM required this visit?  No   Plan:    I have personally reviewed and noted the following in the patient's chart:   Medical and social history Use of alcohol, tobacco or illicit drugs  Current medications and supplements including opioid prescriptions. Patient is not currently taking opioid prescriptions. Functional ability and status Nutritional status Physical activity Advanced directives List of other physicians Hospitalizations, surgeries, and ER visits in previous 12 months Vitals Screenings to include cognitive, depression, and falls Referrals and appointments  In addition, I have reviewed and discussed with patient certain preventive protocols, quality metrics, and best practice recommendations. A written personalized care plan for preventive services as well as general preventive health recommendations were provided to patient.   Sherry LOISE Fuller, LPN   02/21/7972   After Visit Summary: (MyChart) Due to this being a telephonic visit, the after visit summary with patients personalized plan was offered to patient via MyChart   Notes: Patient is due for the following  vaccines: Shingrix, Flu, DTaP and Covid-19.

## 2023-10-03 ENCOUNTER — Other Ambulatory Visit: Payer: Self-pay

## 2023-10-03 ENCOUNTER — Encounter (HOSPITAL_COMMUNITY): Payer: Self-pay | Admitting: Emergency Medicine

## 2023-10-03 ENCOUNTER — Emergency Department (HOSPITAL_COMMUNITY)

## 2023-10-03 ENCOUNTER — Inpatient Hospital Stay (HOSPITAL_COMMUNITY)
Admission: EM | Admit: 2023-10-03 | Discharge: 2023-10-20 | DRG: 951 | Disposition: E | Attending: Internal Medicine | Admitting: Internal Medicine

## 2023-10-03 DIAGNOSIS — I499 Cardiac arrhythmia, unspecified: Secondary | ICD-10-CM | POA: Diagnosis not present

## 2023-10-03 DIAGNOSIS — I5033 Acute on chronic diastolic (congestive) heart failure: Secondary | ICD-10-CM | POA: Diagnosis not present

## 2023-10-03 DIAGNOSIS — Z79899 Other long term (current) drug therapy: Secondary | ICD-10-CM

## 2023-10-03 DIAGNOSIS — R627 Adult failure to thrive: Secondary | ICD-10-CM | POA: Diagnosis present

## 2023-10-03 DIAGNOSIS — R0989 Other specified symptoms and signs involving the circulatory and respiratory systems: Secondary | ICD-10-CM | POA: Diagnosis not present

## 2023-10-03 DIAGNOSIS — E875 Hyperkalemia: Secondary | ICD-10-CM | POA: Diagnosis present

## 2023-10-03 DIAGNOSIS — I2510 Atherosclerotic heart disease of native coronary artery without angina pectoris: Secondary | ICD-10-CM | POA: Diagnosis present

## 2023-10-03 DIAGNOSIS — Z515 Encounter for palliative care: Secondary | ICD-10-CM | POA: Diagnosis not present

## 2023-10-03 DIAGNOSIS — A419 Sepsis, unspecified organism: Secondary | ICD-10-CM | POA: Diagnosis not present

## 2023-10-03 DIAGNOSIS — I132 Hypertensive heart and chronic kidney disease with heart failure and with stage 5 chronic kidney disease, or end stage renal disease: Secondary | ICD-10-CM | POA: Diagnosis not present

## 2023-10-03 DIAGNOSIS — E785 Hyperlipidemia, unspecified: Secondary | ICD-10-CM | POA: Diagnosis not present

## 2023-10-03 DIAGNOSIS — Z825 Family history of asthma and other chronic lower respiratory diseases: Secondary | ICD-10-CM

## 2023-10-03 DIAGNOSIS — Z7984 Long term (current) use of oral hypoglycemic drugs: Secondary | ICD-10-CM

## 2023-10-03 DIAGNOSIS — Z7189 Other specified counseling: Secondary | ICD-10-CM | POA: Diagnosis not present

## 2023-10-03 DIAGNOSIS — Z66 Do not resuscitate: Secondary | ICD-10-CM | POA: Diagnosis present

## 2023-10-03 DIAGNOSIS — R0600 Dyspnea, unspecified: Secondary | ICD-10-CM | POA: Diagnosis not present

## 2023-10-03 DIAGNOSIS — E872 Acidosis, unspecified: Secondary | ICD-10-CM | POA: Diagnosis present

## 2023-10-03 DIAGNOSIS — M810 Age-related osteoporosis without current pathological fracture: Secondary | ICD-10-CM | POA: Diagnosis present

## 2023-10-03 DIAGNOSIS — I5032 Chronic diastolic (congestive) heart failure: Secondary | ICD-10-CM | POA: Diagnosis present

## 2023-10-03 DIAGNOSIS — I5084 End stage heart failure: Secondary | ICD-10-CM | POA: Diagnosis present

## 2023-10-03 DIAGNOSIS — Z555 Less than a high school diploma: Secondary | ICD-10-CM

## 2023-10-03 DIAGNOSIS — D631 Anemia in chronic kidney disease: Secondary | ICD-10-CM | POA: Diagnosis present

## 2023-10-03 DIAGNOSIS — Z993 Dependence on wheelchair: Secondary | ICD-10-CM

## 2023-10-03 DIAGNOSIS — E8770 Fluid overload, unspecified: Principal | ICD-10-CM | POA: Diagnosis present

## 2023-10-03 DIAGNOSIS — E739 Lactose intolerance, unspecified: Secondary | ICD-10-CM | POA: Diagnosis not present

## 2023-10-03 DIAGNOSIS — I350 Nonrheumatic aortic (valve) stenosis: Secondary | ICD-10-CM | POA: Diagnosis present

## 2023-10-03 DIAGNOSIS — R531 Weakness: Secondary | ICD-10-CM | POA: Diagnosis not present

## 2023-10-03 DIAGNOSIS — I468 Cardiac arrest due to other underlying condition: Secondary | ICD-10-CM | POA: Diagnosis present

## 2023-10-03 DIAGNOSIS — R0602 Shortness of breath: Secondary | ICD-10-CM | POA: Diagnosis not present

## 2023-10-03 DIAGNOSIS — Z8249 Family history of ischemic heart disease and other diseases of the circulatory system: Secondary | ICD-10-CM

## 2023-10-03 DIAGNOSIS — G9349 Other encephalopathy: Secondary | ICD-10-CM | POA: Diagnosis present

## 2023-10-03 DIAGNOSIS — Z7401 Bed confinement status: Secondary | ICD-10-CM

## 2023-10-03 DIAGNOSIS — Z860100 Personal history of colon polyps, unspecified: Secondary | ICD-10-CM

## 2023-10-03 DIAGNOSIS — N19 Unspecified kidney failure: Secondary | ICD-10-CM

## 2023-10-03 DIAGNOSIS — Z9981 Dependence on supplemental oxygen: Secondary | ICD-10-CM

## 2023-10-03 DIAGNOSIS — Z8673 Personal history of transient ischemic attack (TIA), and cerebral infarction without residual deficits: Secondary | ICD-10-CM

## 2023-10-03 DIAGNOSIS — I441 Atrioventricular block, second degree: Secondary | ICD-10-CM | POA: Diagnosis not present

## 2023-10-03 DIAGNOSIS — Z809 Family history of malignant neoplasm, unspecified: Secondary | ICD-10-CM

## 2023-10-03 DIAGNOSIS — N185 Chronic kidney disease, stage 5: Secondary | ICD-10-CM | POA: Diagnosis not present

## 2023-10-03 DIAGNOSIS — G40109 Localization-related (focal) (partial) symptomatic epilepsy and epileptic syndromes with simple partial seizures, not intractable, without status epilepticus: Secondary | ICD-10-CM | POA: Diagnosis present

## 2023-10-03 DIAGNOSIS — K219 Gastro-esophageal reflux disease without esophagitis: Secondary | ICD-10-CM | POA: Diagnosis present

## 2023-10-03 DIAGNOSIS — Z7982 Long term (current) use of aspirin: Secondary | ICD-10-CM

## 2023-10-03 DIAGNOSIS — Z886 Allergy status to analgesic agent status: Secondary | ICD-10-CM

## 2023-10-03 DIAGNOSIS — I509 Heart failure, unspecified: Secondary | ICD-10-CM

## 2023-10-03 DIAGNOSIS — Z789 Other specified health status: Secondary | ICD-10-CM | POA: Diagnosis not present

## 2023-10-03 DIAGNOSIS — Z87891 Personal history of nicotine dependence: Secondary | ICD-10-CM

## 2023-10-03 DIAGNOSIS — R918 Other nonspecific abnormal finding of lung field: Secondary | ICD-10-CM | POA: Diagnosis not present

## 2023-10-03 LAB — CBC
HCT: 26.3 % — ABNORMAL LOW (ref 36.0–46.0)
Hemoglobin: 8.3 g/dL — ABNORMAL LOW (ref 12.0–15.0)
MCH: 29.4 pg (ref 26.0–34.0)
MCHC: 31.6 g/dL (ref 30.0–36.0)
MCV: 93.3 fL (ref 80.0–100.0)
Platelets: 217 K/uL (ref 150–400)
RBC: 2.82 MIL/uL — ABNORMAL LOW (ref 3.87–5.11)
RDW: 15.7 % — ABNORMAL HIGH (ref 11.5–15.5)
WBC: 3.8 K/uL — ABNORMAL LOW (ref 4.0–10.5)
nRBC: 0.5 % — ABNORMAL HIGH (ref 0.0–0.2)

## 2023-10-03 LAB — I-STAT CG4 LACTIC ACID, ED: Lactic Acid, Venous: 1.1 mmol/L (ref 0.5–1.9)

## 2023-10-03 NOTE — ED Provider Notes (Signed)
 [place] [facility] Provider Note   CSN: [id number] Arrival date & time: 10/03/23  2205     Patient presents with: Failure To Thrive   Sherry [name] is a 78 y.o. female with extensive medical history to include hypertension, multiple lacunar infarcts, chronic diastolic CHF, anemia, first-degree AV block, seizures, GERD, CKD stage V, CAD, TIAs, aortic stenosis.  Patient presents to ED for evaluation.  Patient family member at bedside provides history.  Reports that ever since being discharged from the hospital 2 weeks ago patient has been more lethargic and unresponsive from her baseline.  Patient daughter reports that patient will sleep all day, not eating as much at home.  Patient daughter reports patient is on hospice.  Reports that she chronically wears 4 L of oxygen and has not increased this recently.  Patient unable to give much history.  Patient daughter reports the patient has been like this for 2 weeks.  Patient daughter states patient typically gets into wheelchair and will help with dishes but unable to do this recently.  Daughter reports this evening they checked her pulse at home and noted to be 46, this concerned them and they brought patient in for evaluation.  Patient family bedside report patient still having stools, denies diarrhea.  HPI     Prior to Admission medications   Medication Sig Start Date End Date Taking? Authorizing Provider  acetaminophen  (TYLENOL ) 325 MG tablet Take 2 tablets (650 mg total) by mouth every 6 (six) hours as needed for mild pain (or Fever >/= 101). Patient taking differently: Take 650 mg by mouth every 6 (six) hours as needed for mild pain (pain score 1-3) or moderate pain (pain score 4-6) (or Fever >/= 101). 11/21/22   Jolaine Pac, DO  albuterol  (PROVENTIL ) (2.5 MG/3ML) 0.083% nebulizer solution Take 3 mLs (2.5 mg total) by nebulization every 6 (six) hours as needed for wheezing. 06/25/23   Fernand Prost,  MD  amLODipine  (NORVASC ) 10 MG tablet Take 1 tablet (10 mg total) by mouth daily. 06/24/23 09/22/23  Fernand Prost, MD  aspirin  81 MG chewable tablet Chew 1 tablet (81 mg total) by mouth daily. 06/24/23   Fernand Prost, MD  calcium  carbonate (OSCAL) 1500 (600 Ca) MG TABS tablet Take 1 tablet (1,500 mg total) by mouth 3 (three) times daily with meals. 06/25/23   Fernand Prost, MD  cholecalciferol  (VITAMIN D3) 25 MCG (1000 UNIT) tablet Take 1 tablet (1,000 Units total) by mouth daily. 06/25/23 06/24/24  Fernand Prost, MD  cloNIDine  (CATAPRES ) 0.1 MG tablet Take 1 tablet (0.1 mg total) by mouth 2 (two) times daily. 07/11/23   Glena Harlene HERO, FNP  empagliflozin  (JARDIANCE ) 10 MG TABS tablet Take 1 tablet (10 mg total) by mouth daily before breakfast. 05/12/23   Rolan Ezra RAMAN, MD  febuxostat  (ULORIC ) 40 MG tablet Take 0.5 tablets (20 mg total) by mouth daily. 06/25/23 06/24/24  Fernand Prost, MD  hydrALAZINE  (APRESOLINE ) 100 MG tablet TAKE 1 TABLET(100 MG) BY MOUTH THREE TIMES DAILY Patient taking differently: Take 100 mg by mouth 3 (three) times daily. 02/04/23 02/04/24  Fernand Prost, MD  lidocaine  (LMX) 4 % cream Apply 1 Application topically 2 (two) times daily as needed (Pain on sacral wound area). 06/25/23   Fernand Prost, MD  multivitamin (RENA-VIT) TABS tablet Take 1 tablet by mouth at bedtime. 06/25/23   Fernand Prost, MD  pantoprazole  (PROTONIX ) 40 MG tablet TAKE 1 TABLET BY MOUTH DAILY 07/21/23   Fernand Prost, MD  PHENobarbital  (LUMINAL) 64.8 MG tablet Take 2 tablets (129.6 mg total) by mouth at bedtime. 05/23/23   Georjean Darice HERO, MD  potassium chloride  SA (KLOR-CON  M) 20 MEQ tablet Take 1 tablet (20 mEq total) by mouth daily. 07/11/23   Glena Harlene HERO, FNP  rosuvastatin  (CRESTOR ) 40 MG tablet Take 1 tablet (40 mg total) by mouth daily. 06/25/23 06/19/24  Fernand Prost, MD  sevelamer  carbonate (RENVELA ) 800 MG tablet Take 1 tablet (800 mg total) by mouth 3 (three) times daily with meals. 04/14/23 04/13/24  Fernand Prost, MD   torsemide  (DEMADEX ) 20 MG tablet Take 80 mg daily every other day ALTERNATING with 60 mg daily every other day 07/25/23   Glena Harlene HERO, FNP  zonisamide  (ZONEGRAN ) 100 MG capsule TAKE 1 CAPSULE BY MOUTH AT NIGHT 04/24/23   Georjean Darice HERO, MD    Allergies: Ibuprofen and Lactose intolerance (gi)    Review of Systems  Unable to perform ROS: Acuity of condition (Level 5 caveat)  All other systems reviewed and are negative.   Updated Vital Signs BP (!) 157/75   Pulse 63   Temp 97.8 F (36.6 C) (Axillary)   Resp (!) 22   SpO2 98%   Physical Exam Vitals and nursing note reviewed.  Constitutional:      General: She is not in acute distress.    Appearance: She is well-developed.  HENT:     Head: Normocephalic and atraumatic.  Eyes:     Conjunctiva/sclera: Conjunctivae normal.  Cardiovascular:     Rate and Rhythm: Normal rate. Rhythm irregular.     Heart sounds: No murmur heard. Pulmonary:     Effort: Pulmonary effort is normal. No respiratory distress.     Breath sounds: Normal breath sounds.  Abdominal:     Palpations: Abdomen is soft.     Tenderness: There is no abdominal tenderness.  Musculoskeletal:        General: No swelling.     Cervical back: Neck supple.     Right lower leg: Edema present.     Left lower leg: Edema present.     Comments: 2+ edema bilateral lower extremities, pitting  Skin:    General: Skin is warm and dry.     Capillary Refill: Capillary refill takes less than 2 seconds.  Neurological:     Mental Status: She is alert. Mental status is at baseline.     Comments: Follows commands appropriately.  Alert to baseline.  Psychiatric:        Mood and Affect: Mood normal.     (all labs ordered are listed, but only abnormal results are displayed) Labs Reviewed  CBC - Abnormal; Notable for the following components:      Result Value   WBC 3.8 (*)    RBC 2.82 (*)    Hemoglobin 8.3 (*)    HCT 26.3 (*)    RDW 15.7 (*)    nRBC 0.5 (*)    All other  components within normal limits  COMPREHENSIVE METABOLIC PANEL WITH GFR - Abnormal; Notable for the following components:   Sodium 131 (*)    Potassium 6.8 (*)    CO2 10 (*)    BUN 144 (*)    Creatinine, Ser 6.24 (*)    Calcium  7.6 (*)    Albumin 3.0 (*)    AST 43 (*)    GFR, Estimated 6 (*)    Anion gap 20 (*)    All other components within normal limits  AMMONIA -  Abnormal; Notable for the following components:   Ammonia 66 (*)    All other components within normal limits  BRAIN NATRIURETIC PEPTIDE - Abnormal; Notable for the following components:   B Natriuretic Peptide >4,500.0 (*)    All other components within normal limits  CBG MONITORING, ED - Abnormal; Notable for the following components:   Glucose-Capillary 132 (*)    All other components within normal limits  CULTURE, BLOOD (ROUTINE X 2)  CULTURE, BLOOD (ROUTINE X 2)  URINALYSIS, ROUTINE W REFLEX MICROSCOPIC  I-STAT CG4 LACTIC ACID, ED  I-STAT CG4 LACTIC ACID, ED    EKG: None  Radiology: DG Chest Portable 1 View Result Date: 10/16/23 CLINICAL DATA:  Shortness of breath, sepsis EXAM: PORTABLE CHEST 1 VIEW COMPARISON:  09/15/2023 FINDINGS: Cardiomegaly, vascular congestion. Bilateral lower lobe airspace opacities and probable layering effusions. Findings are similar to prior study. IMPRESSION: Cardiomegaly, vascular congestion. Probable layering bilateral effusions with bibasilar atelectasis or infiltrates. Electronically Signed   By: Franky Crease M.D.   On: 2023/10/16 00:12     .Critical Care  Performed by: Ruthell Lonni FALCON, PA-C Authorized by: Ruthell Lonni FALCON, PA-C   Critical care provider statement:    Critical care time (minutes):  55   Critical care time was exclusive of:  Separately billable procedures and treating other patients   Critical care was necessary to treat or prevent imminent or life-threatening deterioration of the following conditions:  Metabolic crisis and renal failure    Critical care was time spent personally by me on the following activities:  Blood draw for specimens, development of treatment plan with patient or surrogate, discussions with consultants, discussions with primary provider, evaluation of patient's response to treatment, examination of patient, interpretation of cardiac output measurements, obtaining history from patient or surrogate, ordering and performing treatments and interventions, ordering and review of radiographic studies, ordering and review of laboratory studies, pulse oximetry, re-evaluation of patient's condition and review of old charts   I assumed direction of critical care for this patient from another provider in my specialty: no     Care discussed with: admitting provider      Medications Ordered in the ED  insulin  aspart (novoLOG ) injection 5 Units (5 Units Intravenous Given Oct 16, 2023 0105)    And  dextrose  50 % solution 50 mL (50 mLs Intravenous Given Oct 16, 2023 0105)  calcium  gluconate 1 g/ 50 mL sodium chloride  IVPB (0 mg Intravenous Stopped Oct 16, 2023 0058)  furosemide  (LASIX ) injection 60 mg (60 mg Intravenous Given 2023/10/16 0105)  HYDROmorphone  (DILAUDID ) injection 0.5 mg (0.5 mg Intravenous Given 2023/10/16 0300)       Medical Decision Making Amount and/or Complexity of Data Reviewed Labs: ordered. Radiology: ordered. ECG/medicine tests: ordered.  Risk OTC drugs. Prescription drug management. Decision regarding hospitalization.   This is a 78 year old female presenting to the ED out of concern of failure to thrive.  On arrival, she is afebrile, nontachycardic in a rhythm of A-fib.  Lung sounds are clear bilaterally, no hypoxia.  Abdomen soft and compressible.  Neuroexam reveals patient is at baseline mental status.  Responds to commands, follows commands appropriately.  She has 2+ pitting edema to bilateral lower extremities.  Patient chart reviewed.  Patient unfortunately has been placed on hospice on her most recent  admission.  She is not a candidate for hemodialysis, TAVR.  Has severe aortic stenosis.  Patient worked up utilizing CBC, CMP, BNP, ammonia, i-STAT lactic acid x 2, blood culture, urinalysis, chest x-ray.  Patient CBC reveals no  leukocytosis, baseline hemoglobin 8.3.  Metabolic panel reveals sodium 868, potassium 6.8, BUN 144, creatinine 6.24 with GFR 6, anion gap 20.  Patient has AKI.  2 weeks ago patient creatinine 5.  Patient BNP here chronically elevated greater than 4500.  Her ammonia is elevated at 66.  Her chest x-ray shows pleural effusions and pulmonary vascular congestion.  Patient given 60 mg Lasix  for volume overload.  Patient given 5 units insulin , calcium  gluconate, D50 for hyperkalemia in the setting of renal insufficiency.  Discussed with the patient family members at the bedside their wishes for the patient.  The patient is DNR.  Patient family members do wish for the patient to be admitted at this time for care of acute issues.  I discussed with the patient family members that patient prognosis is unfortunately poor at this time.  I have discussed the patient with internal medicine teaching service and they have agreed to admit the patient for treatment.  Patient stable on admission.    Final diagnoses:  Hypervolemia, unspecified hypervolemia type  Hyperkalemia  Metabolic acidosis    ED Discharge Orders     None          Ruthell Lonni JULIANNA DEVONNA 10/14/2023 KIN Griselda Norris, MD 2023/10/14 [phone]

## 2023-10-03 NOTE — ED Triage Notes (Signed)
 Pt is on hospice. Pt was sent by family for being less responsive.

## 2023-10-04 ENCOUNTER — Other Ambulatory Visit: Payer: Self-pay

## 2023-10-04 DIAGNOSIS — Z789 Other specified health status: Secondary | ICD-10-CM

## 2023-10-04 DIAGNOSIS — I350 Nonrheumatic aortic (valve) stenosis: Secondary | ICD-10-CM | POA: Diagnosis present

## 2023-10-04 DIAGNOSIS — E875 Hyperkalemia: Secondary | ICD-10-CM

## 2023-10-04 DIAGNOSIS — I5033 Acute on chronic diastolic (congestive) heart failure: Secondary | ICD-10-CM | POA: Diagnosis present

## 2023-10-04 DIAGNOSIS — I132 Hypertensive heart and chronic kidney disease with heart failure and with stage 5 chronic kidney disease, or end stage renal disease: Secondary | ICD-10-CM | POA: Diagnosis present

## 2023-10-04 DIAGNOSIS — E872 Acidosis, unspecified: Secondary | ICD-10-CM

## 2023-10-04 DIAGNOSIS — E785 Hyperlipidemia, unspecified: Secondary | ICD-10-CM | POA: Diagnosis present

## 2023-10-04 DIAGNOSIS — Z66 Do not resuscitate: Secondary | ICD-10-CM

## 2023-10-04 DIAGNOSIS — N19 Unspecified kidney failure: Secondary | ICD-10-CM

## 2023-10-04 DIAGNOSIS — I5084 End stage heart failure: Secondary | ICD-10-CM | POA: Diagnosis present

## 2023-10-04 DIAGNOSIS — G9349 Other encephalopathy: Secondary | ICD-10-CM

## 2023-10-04 DIAGNOSIS — Z7984 Long term (current) use of oral hypoglycemic drugs: Secondary | ICD-10-CM | POA: Diagnosis not present

## 2023-10-04 DIAGNOSIS — I2510 Atherosclerotic heart disease of native coronary artery without angina pectoris: Secondary | ICD-10-CM | POA: Diagnosis present

## 2023-10-04 DIAGNOSIS — Z515 Encounter for palliative care: Principal | ICD-10-CM

## 2023-10-04 DIAGNOSIS — M810 Age-related osteoporosis without current pathological fracture: Secondary | ICD-10-CM | POA: Diagnosis present

## 2023-10-04 DIAGNOSIS — D631 Anemia in chronic kidney disease: Secondary | ICD-10-CM | POA: Diagnosis present

## 2023-10-04 DIAGNOSIS — R627 Adult failure to thrive: Secondary | ICD-10-CM | POA: Diagnosis present

## 2023-10-04 DIAGNOSIS — E8770 Fluid overload, unspecified: Secondary | ICD-10-CM

## 2023-10-04 DIAGNOSIS — I441 Atrioventricular block, second degree: Secondary | ICD-10-CM | POA: Diagnosis present

## 2023-10-04 DIAGNOSIS — K219 Gastro-esophageal reflux disease without esophagitis: Secondary | ICD-10-CM | POA: Diagnosis present

## 2023-10-04 DIAGNOSIS — I468 Cardiac arrest due to other underlying condition: Secondary | ICD-10-CM | POA: Diagnosis present

## 2023-10-04 DIAGNOSIS — E739 Lactose intolerance, unspecified: Secondary | ICD-10-CM | POA: Diagnosis present

## 2023-10-04 DIAGNOSIS — N185 Chronic kidney disease, stage 5: Secondary | ICD-10-CM | POA: Diagnosis present

## 2023-10-04 DIAGNOSIS — Z7189 Other specified counseling: Secondary | ICD-10-CM

## 2023-10-04 DIAGNOSIS — R0600 Dyspnea, unspecified: Secondary | ICD-10-CM | POA: Diagnosis present

## 2023-10-04 DIAGNOSIS — G40109 Localization-related (focal) (partial) symptomatic epilepsy and epileptic syndromes with simple partial seizures, not intractable, without status epilepticus: Secondary | ICD-10-CM | POA: Diagnosis present

## 2023-10-04 LAB — CBG MONITORING, ED: Glucose-Capillary: 132 mg/dL — ABNORMAL HIGH (ref 70–99)

## 2023-10-04 LAB — COMPREHENSIVE METABOLIC PANEL WITH GFR
ALT: 24 U/L (ref 0–44)
AST: 43 U/L — ABNORMAL HIGH (ref 15–41)
Albumin: 3 g/dL — ABNORMAL LOW (ref 3.5–5.0)
Alkaline Phosphatase: 105 U/L (ref 38–126)
Anion gap: 20 — ABNORMAL HIGH (ref 5–15)
BUN: 144 mg/dL — ABNORMAL HIGH (ref 8–23)
CO2: 10 mmol/L — ABNORMAL LOW (ref 22–32)
Calcium: 7.6 mg/dL — ABNORMAL LOW (ref 8.9–10.3)
Chloride: 101 mmol/L (ref 98–111)
Creatinine, Ser: 6.24 mg/dL — ABNORMAL HIGH (ref 0.44–1.00)
GFR, Estimated: 6 mL/min — ABNORMAL LOW (ref >60)
Glucose, Bld: 71 mg/dL (ref 70–99)
Potassium: 6.8 mmol/L (ref 3.5–5.1)
Sodium: 131 mmol/L — ABNORMAL LOW (ref 135–145)
Total Bilirubin: 0.8 mg/dL (ref 0.0–1.2)
Total Protein: 6.7 g/dL (ref 6.5–8.1)

## 2023-10-04 LAB — AMMONIA: Ammonia: 66 umol/L — ABNORMAL HIGH (ref 9–35)

## 2023-10-04 LAB — BRAIN NATRIURETIC PEPTIDE: B Natriuretic Peptide: >4500 pg/mL — ABNORMAL HIGH (ref 0.0–100.0)

## 2023-10-04 MED ORDER — ONDANSETRON 4 MG PO TBDP
4.0000 mg | ORAL_TABLET | Freq: Four times a day (QID) | ORAL | Status: DC | PRN
Start: 2023-10-04 — End: 2023-10-05

## 2023-10-04 MED ORDER — HEPARIN SODIUM (PORCINE) 5000 UNIT/ML IJ SOLN
5000.0000 [IU] | Freq: Three times a day (TID) | INTRAMUSCULAR | Status: DC
Start: 2023-10-04 — End: 2023-10-04

## 2023-10-04 MED ORDER — BISACODYL 10 MG RE SUPP: 10.0000 mg | Freq: Every day | RECTAL | Status: DC | PRN

## 2023-10-04 MED ORDER — GLYCOPYRROLATE 0.2 MG/ML IJ SOLN
0.2000 mg | INTRAMUSCULAR | Status: DC | PRN
  Administered 2023-10-04 (×3): 0.2 mg via INTRAVENOUS
  Filled 2023-10-04 (×3): qty 1

## 2023-10-04 MED ORDER — LORAZEPAM 2 MG/ML IJ SOLN: 1.0000 mg | INTRAMUSCULAR | Status: DC | PRN

## 2023-10-04 MED ORDER — LORAZEPAM 1 MG PO TABS
1.0000 mg | ORAL_TABLET | ORAL | Status: DC | PRN
Start: 2023-10-04 — End: 2023-10-04

## 2023-10-04 MED ORDER — INSULIN ASPART 100 UNIT/ML IV SOLN
5.0000 [IU] | Freq: Once | INTRAVENOUS | Status: AC
  Administered 2023-10-04: 5 [IU] via INTRAVENOUS

## 2023-10-04 MED ORDER — BIOTENE DRY MOUTH MT LIQD
15.0000 mL | Freq: Two times a day (BID) | OROMUCOSAL | Status: DC
  Administered 2023-10-04: 15 mL via OROMUCOSAL

## 2023-10-04 MED ORDER — FUROSEMIDE 10 MG/ML IJ SOLN
60.0000 mg | Freq: Once | INTRAMUSCULAR | Status: AC
  Administered 2023-10-04: 60 mg via INTRAVENOUS
  Filled 2023-10-04: qty 6

## 2023-10-04 MED ORDER — LORAZEPAM 2 MG/ML IJ SOLN
1.0000 mg | INTRAMUSCULAR | Status: DC | PRN
  Administered 2023-10-04: 1 mg via INTRAVENOUS
  Filled 2023-10-04: qty 1

## 2023-10-04 MED ORDER — ACETAMINOPHEN 325 MG PO TABS: 650.0000 mg | ORAL_TABLET | Freq: Four times a day (QID) | ORAL | Status: DC | PRN

## 2023-10-04 MED ORDER — ACETAMINOPHEN 650 MG RE SUPP: 650.0000 mg | Freq: Four times a day (QID) | RECTAL | Status: DC | PRN

## 2023-10-04 MED ORDER — ONDANSETRON HCL 4 MG/2ML IJ SOLN: 4.0000 mg | Freq: Four times a day (QID) | INTRAMUSCULAR | Status: DC | PRN

## 2023-10-04 MED ORDER — HYDROMORPHONE HCL 1 MG/ML IJ SOLN
0.5000 mg | INTRAMUSCULAR | Status: DC | PRN
  Administered 2023-10-04 (×3): 1 mg via INTRAVENOUS
  Filled 2023-10-04 (×3): qty 1

## 2023-10-04 MED ORDER — LORAZEPAM 1 MG PO TABS: 1.0000 mg | ORAL_TABLET | ORAL | Status: DC | PRN

## 2023-10-04 MED ORDER — LORAZEPAM 2 MG/ML PO CONC: 1.0000 mg | ORAL | Status: DC | PRN

## 2023-10-04 MED ORDER — LORAZEPAM 2 MG/ML PO CONC
1.0000 mg | ORAL | Status: DC | PRN
Start: 2023-10-04 — End: 2023-10-04

## 2023-10-04 MED ORDER — DEXTROSE 50 % IV SOLN
1.0000 | Freq: Once | INTRAVENOUS | Status: AC
  Administered 2023-10-04: 50 mL via INTRAVENOUS
  Filled 2023-10-04: qty 50

## 2023-10-04 MED ORDER — GLYCOPYRROLATE 0.2 MG/ML IJ SOLN: 0.2000 mg | INTRAMUSCULAR | Status: DC | PRN

## 2023-10-04 MED ORDER — GLYCOPYRROLATE 1 MG PO TABS: 1.0000 mg | ORAL_TABLET | ORAL | Status: DC | PRN

## 2023-10-04 MED ORDER — HYDROMORPHONE HCL 1 MG/ML IJ SOLN
0.5000 mg | Freq: Once | INTRAMUSCULAR | Status: AC
  Administered 2023-10-04: 0.5 mg via INTRAVENOUS
  Filled 2023-10-04: qty 1

## 2023-10-04 MED ORDER — CALCIUM GLUCONATE-NACL 1-0.675 GM/50ML-% IV SOLN
1.0000 g | Freq: Once | INTRAVENOUS | Status: AC
  Administered 2023-10-04: 1000 mg via INTRAVENOUS
  Filled 2023-10-04: qty 50

## 2023-10-04 MED ORDER — HYDROMORPHONE HCL 1 MG/ML IJ SOLN: 0.5000 mg | INTRAMUSCULAR | Status: DC | PRN

## 2023-10-08 LAB — CULTURE, BLOOD (ROUTINE X 2)
Culture: NO GROWTH
Culture: NO GROWTH

## 2023-10-13 ENCOUNTER — Other Ambulatory Visit: Payer: Self-pay | Admitting: Student

## 2023-10-13 NOTE — Telephone Encounter (Signed)
 Message has been forwarded to the OFfice Administrator Donia Jester  Copied from CRM [id number]. Topic: General - Deceased Patient >> 11-05-2023 11:51 AM Carmell SAUNDERS wrote: Name of caller: Valere Junie Alpha Director   Date of death: 10/27/23   Name of funeral home: [facility]  Phone number of funeral home: [phone]  Provider that needs to sign form: Libby Blanch   Timeline for signing: As soon as possible

## 2023-10-13 NOTE — Progress Notes (Signed)
 Internal Medicine Attending:  I reviewed the AWV findings of the medical professional who conducted the visit. I was present in the office suite and immediately available to provide assistance and direction throughout the time the service was provided.

## 2023-10-20 NOTE — Progress Notes (Signed)
 Goals of Care Family Meeting  Date carried out:October 13, 2023   Location of the meeting: Bedside  Member's involved: This MD, husband, Mr. Zill, daughter, Apolinar, and niece met at bedside in the ED shortly after admitting [name] to IMTS.   Sherry [name] was recently transitioned to hospice care for end-stage HF due to severe aortic stenosis and end stage kidney failure and unable to pursue HD. We discussed her uremic symptoms and encephalopathy. They hoped there was a quick fix for her current condition, which is why Sherry [name] was brought to the hospital.   After they shared her difficult journey with the co-morbidities mentioned above and their collective desire for Sherry [name]'s comfort and dignity, both husband and daughter made the decision to transition Sherry [name] to Comfort care.   Disposition: Comfort orders placed. Family requested pastoral/spiritual care services.    Hadassah Kristy Ahr MD Internal Medicine Resident  [facility] program Oct 13, 2023 4:15 AM

## 2023-10-20 NOTE — Plan of Care (Signed)
  Problem: Education: Goal: Knowledge of the prescribed therapeutic regimen will improve Outcome: Not Progressing   Problem: Coping: Goal: Ability to identify and develop effective coping behavior will improve Outcome: Not Progressing   Problem: Clinical Measurements: Goal: Quality of life will improve Outcome: Not Progressing   Problem: Respiratory: Goal: Verbalizations of increased ease of respirations will increase Outcome: Not Progressing   Problem: Role Relationship: Goal: Family's ability to cope with current situation will improve Outcome: Not Progressing Goal: Ability to verbalize concerns, feelings, and thoughts to partner or family member will improve Outcome: Not Progressing   Problem: Pain Management: Goal: Satisfaction with pain management regimen will improve Outcome: Not Progressing   Problem: Education: Goal: Knowledge of General Education information will improve Description: Including pain rating scale, medication(s)/side effects and non-pharmacologic comfort measures Outcome: Not Progressing   Problem: Health Behavior/Discharge Planning: Goal: Ability to manage health-related needs will improve Outcome: Not Progressing   Problem: Clinical Measurements: Goal: Ability to maintain clinical measurements within normal limits will improve Outcome: Not Progressing Goal: Will remain free from infection Outcome: Not Progressing Goal: Diagnostic test results will improve Outcome: Not Progressing Goal: Respiratory complications will improve Outcome: Not Progressing Goal: Cardiovascular complication will be avoided Outcome: Not Progressing   Problem: Activity: Goal: Risk for activity intolerance will decrease Outcome: Not Progressing   Problem: Nutrition: Goal: Adequate nutrition will be maintained Outcome: Not Progressing   Problem: Coping: Goal: Level of anxiety will decrease Outcome: Not Progressing   Problem: Elimination: Goal: Will not  experience complications related to bowel motility Outcome: Not Progressing Goal: Will not experience complications related to urinary retention Outcome: Not Progressing   Problem: Pain Managment: Goal: General experience of comfort will improve and/or be controlled Outcome: Not Progressing   Problem: Safety: Goal: Ability to remain free from injury will improve Outcome: Not Progressing   Problem: Skin Integrity: Goal: Risk for impaired skin integrity will decrease Outcome: Not Progressing

## 2023-10-20 NOTE — Death Summary Note (Signed)
 Death Summary    Patient details  Name: Sherry [name]  MRN: [id number]  DOB: Dec 04, 1945  Admit Date: 10/03/2023  Length of Stay: 0  Date of death: October 24, 2023 Time of Death: 1822-11-03   Profile data   Living situation: home  Independent with ADLs: no Bed bound or WC bound : yes Code status  Advanced directives:DNR on arrival   Reason for admission   Uremic encephalopathy Preliminary Cause of death   Cardiac arrest likely from CKD and Uremia  Secondary diagnoses, contributing co-morbidities & complicating factors  CKD stage V CHF Hypertension Osteoporosis Anemia of chronic disease Aortic stenosis Seizures Hyperlipidemia GERD   Condition on arrival (check all that apply)   Sherry [name] is a 78 y.o. female admitted home  with a past medical history of CKD stage V who presented with concerns of encephalopathy and found to have uremia.  Patient has history of aortic stenosis and is a poor dialysis candidate, and therefore dialysis was deferred.  Patient subsequently went into home hospice.  Hospital course    Sherry [name] is a 78 y.o. female admitted home  with a past medical history of CKD stage V who presented with concerns of encephalopathy and found to have uremia.  Patient has history of aortic stenosis and is a poor dialysis candidate, and therefore dialysis was deferred.  Patient subsequently went into home hospice.  #CKD stage V #CHF #Hypertension #Aortic stenosis #Anemia of chronic disease #Seizures #Hyperlipidemia #GERD Patient admitted with concerns of uremic encephalopathy.  Patient was previously admitted for concerns of acute on chronic heart failure.  Patient has history of severe aortic stenosis.  She is not a TAVR candidate.  She also has CKD stage V.  She is not a dialysis candidate.  In previous admission, patient was discharged to home hospice.  She presented back to the emergency department concerns of encephalopathy.  During hospital  course, patient was made comfort care.  Palliative care was following.  Family opted for hospital death versus inpatient hospice.  Patient peacefully passed at 15 with family at bedside.  Family notified.  Libby Blanch, DO   Internal medicine resident PGY 3

## 2023-10-20 NOTE — Progress Notes (Signed)
 HD#0 SUBJECTIVE:  Patient Summary: Sherry [name] is a 78 y.o. person living with a history of Diastolic Heart Failure, HTN, severe aortic stenosis, ASCVD, 1st degree AV block who is on home hospice and presented from home via EMS with concern from her daughter about increasing difficulty breathing and admitted for uremic encephalopathy   Interim History: after thorough discussion with both her husband and daughter, the decision was made to transition Sherry [name] to comfort measures only.  OBJECTIVE:  Vital Signs: Vitals:   10-29-2023 0245 2023-10-29 0752 Oct 29, 2023 0809 2023/10/29 0829  BP: (!) 157/75  (!) 112/49 (!) 107/50  Pulse: 63  68 64  Resp: (!) 22  20 14   Temp:  98.2 F (36.8 C) 97.8 F (36.6 C) (!) 97.5 F (36.4 C)  TempSrc:  Axillary Axillary Oral  SpO2: 98%  100% (!) 88%   Supplemental O2: Nasal Cannula SpO2: (!) 88 % O2 Flow Rate (L/min): 4 L/min  There were no vitals filed for this visit.   Intake/Output Summary (Last 24 hours) at Oct 29, 2023 0934 Last data filed at 10-29-23 0058 Gross per 24 hour  Intake 50 ml  Output --  Net 50 ml   Net IO Since Admission: 50 mL [Oct 29, 2023 0934]  Physical Exam: Constitutional: peacefully resting female, arouses to sternal rub with frequent moans Cardiovascular: Systolic ejection murmur noted on right 2nd intercostal space Pulmonary/Chest: increased work of breathing with intermittent gasps. Pitting edema noted in all 4 extremities Abdominal: Distended with pitting edema Skin: warm and dry Patient Lines/Drains/Airways Status     Active Line/Drains/Airways     Name Placement date Placement time Site Days   Peripheral IV 10-29-2023 20 G Anterior;Left Forearm Oct 29, 2023  0034  Forearm  less than 1   Wound / Incision (Open or Dehisced) 08/30/22 Incision - Open;Skin tear Pelvis Anterior;Right;Left 08/30/22  1430  Pelvis  400   Wound 09/16/23 1530 Coccyx 09/16/23  1530  Coccyx  18            Pertinent labs and imaging:       Latest Ref Rng & Units 10/03/2023   11:39 PM 09/16/2023    1:26 AM 09/15/2023    8:55 PM  CBC  WBC 4.0 - 10.5 K/uL 3.8  2.2  2.3   Hemoglobin 12.0 - 15.0 g/dL 8.3  7.9  7.9   Hematocrit 36.0 - 46.0 % 26.3  26.2  26.2   Platelets 150 - 400 K/uL 217  144  147        Latest Ref Rng & Units 10/03/2023   11:39 PM 09/16/2023    1:26 AM 09/15/2023    8:55 PM  CMP  Glucose 70 - 99 mg/dL 71  85  98   BUN 8 - 23 mg/dL 855  895  894   Creatinine 0.44 - 1.00 mg/dL 3.75  4.99  4.97   Sodium 135 - 145 mmol/L 131  140  140   Potassium 3.5 - 5.1 mmol/L 6.8  4.8  4.8   Chloride 98 - 111 mmol/L 101  108  110   CO2 22 - 32 mmol/L 10  18  19    Calcium  8.9 - 10.3 mg/dL 7.6  7.3  7.3   Total Protein 6.5 - 8.1 g/dL 6.7  5.6    Total Bilirubin 0.0 - 1.2 mg/dL 0.8  0.6    Alkaline Phos 38 - 126 U/L 105  94    AST 15 - 41 U/L 43  39  ALT 0 - 44 U/L 24  20      DG Chest Portable 1 View Result Date: 10/22/23 CLINICAL DATA:  Shortness of breath, sepsis EXAM: PORTABLE CHEST 1 VIEW COMPARISON:  09/15/2023 FINDINGS: Cardiomegaly, vascular congestion. Bilateral lower lobe airspace opacities and probable layering effusions. Findings are similar to prior study. IMPRESSION: Cardiomegaly, vascular congestion. Probable layering bilateral effusions with bibasilar atelectasis or infiltrates. Electronically Signed   By: Franky Crease M.D.   On: 10-22-2023 00:12    ASSESSMENT/PLAN:  Assessment: Principal Problem:   Uremic encephalopathy Active Problems:   CKD (chronic kidney disease), symptom management only, stage 5 (HCC)   Chronic heart failure with preserved ejection fraction (HCC)   Severe aortic stenosis   CHF (congestive heart failure) (HCC)   End of life care   Hospice care patient   Plan: Sherry [name] is a 78 year old female with a history of diastolic heart failure, hypertension, severe aortic stenosis, ASCVD, and first-degree AV block. She was previously discharged on 7/31 with home  hospice. She presented from home via EMS on hospital day 0 due to family concern for increased shortness of breath and altered mental status. She was admitted with concern for uremic encephalopathy. after thorough discussion with both her husband and daughter, the decision was made to transition Sherry [name] to comfort measures only. Anticipated outcome: In-hospital death likely within the next 24 hours.  End-of-Life and Hospice Care: Patient was receiving home hospice services prior to admission. Husband reports patient has become less interactive and increasingly short of breath over the past two weeks. He understands the prognosis and expressed a desire to prevent suffering, but initially deferred final decisions pending a conversation with their daughter. Daughter was contacted directly and informed of the patient's critical condition and likely imminent passing. She verbalized understanding and came to the hospital. Following the family's discussion, both agreed to forgo further aggressive interventions and proceed with comfort-focused care. Plan: Continue symptom and pain management. No further aggressive medical treatment. Consults to palliative care and spiritual/pastoral care already placed.  Chronic Cardiac History: Known diastolic heart failure and severe aortic stenosis. Previously evaluated by cardiology and deemed not a candidate for TAVR or CABG. Currently shows signs of volume overload and anasarca with reduced urine output. Aggressive diuresis is indicated but deferred due to goals of care.  Renal History / Uremic Encephalopathy: CKD stage 5 with significantly elevated BUN (144). Lethargic on exam with evidence of uremic symptoms (twitching). Patient has previously been evaluated and found unsuitable for dialysis due to underlying cardiac disease.  Will continue palliative management only. After thorough discussion with both her husband and daughter, the decision was made  to transition Sherry [name] to comfort measures only. Anticipated outcome: In-hospital death likely within the next 24 hours.  Best practice: Diet: NPO VTE: None IVF: None,None Code: DNR/DNI   Disposition planning: Prior to Admission Living Arrangement: Home, living with husband Anticipated Discharge Location: Anticipated hospital death within the next 24 hours   Dispo: Admit patient to Observation with expected length of stay less than 2 midnights.    Signature:  Luismario Coston [facility]  9:34 AM, Oct 22, 2023  On Call pager [phone]

## 2023-10-20 NOTE — Consult Note (Signed)
 Consultation Note Date: 10-18-23   Patient Name: Sherry [name]  DOB: [birth year]  MRN: [id number]  Age / Sex: 78 y.o., female  PCP: Myrna Bitters, DO Referring Physician: Trudy Mliss Dragon, MD  Reason for Consultation: Non pain symptom management, Pain control, Psychosocial/spiritual support, and Terminal Care, Patient coming from [facility]. Family is denial of eminent death. Will need support   HPI/Patient Profile: 78 y.o. female  with past medical history of PMH of HTN, CHF, osteoporosis, osteoarthritis, anemia, aortic stenosis, 1st degree AV block, ASCVD, seizures, HLD, CKD 5 not on dialysis, and GERD from home on hospice was brought to the ED by her daughter for concerns of dyspnea and confusion. After discussions with admitting provider, family opted for her admission on 10/03/2023 for end of life care.   Clinical Assessment and Goals of Care: I have reviewed medical records including EPIC notes, labs, any available advanced directives, and imaging. Received report from primary RN - no acute concerns.   Went to visit patient at bedside - daughter/[name] and two of her sisters present. Patient was lying in bed unresponsive. No signs or non-verbal gestures of pain or discomfort noted. No respiratory distress, increased work of breathing, or secretions noted. She is on 2L O2 [place]. Family are providing therapeutic presence.   Goal for full comfort measures confirmed. They have a clear understanding of her current medical situation.   Therapeutic listening provided as family reflect on patient's hobbies. She enjoyed being outside in the sun, cooking, and watching tv.   Discussed option of patient's transfer to hospice facility - family are not interested in her transfer at this time, they would prefer she remain in house for EOL care.   Visit also consisted of discussions dealing with the complex and  emotionally intense issues of symptom management and palliative care in the setting of serious and life-threatening illness.   Discussed with patient/family the importance of continued conversation with each other and the medical providers regarding overall plan of care and treatment options, ensuring decisions are within the context of the patient's values and GOCs.    Questions and concerns were addressed. The patient/family was encouraged to call with questions and/or concerns. PMT card was provided.   Primary Decision Maker: NEXT OF KIN - daughter/[name] Wakeley    SUMMARY OF RECOMMENDATIONS   Continue full comfort measures Continue DNR/DNI as previously documented Family are not interested in patient's transfer to residential hospice facility - anticipate hospital death Added orders for EOL symptom management and to reflect full comfort measures, as well as discontinued orders that were not focused on comfort Unrestricted visitation orders were placed per current  EOL visitation policy  Nursing to provide frequent assessments and administer PRN medications as clinically necessary to ensure EOL comfort PMT will continue to follow and support holistically  Symptom Management Dilaudid  PRN pain/dyspnea/increased work of breathing/RR>25 Tylenol  PRN pain/fever Biotin twice daily Robinul  PRN secretions Ativan  PRN anxiety/seizure/sleep/distress Zofran  PRN nausea/vomiting    Code Status/Advance Care Planning: DNR  Palliative Prophylaxis:  Frequent Pain Assessment, Oral Care, and Turn Reposition  Additional Recommendations (Limitations, Scope, Preferences): Full Comfort Care  Psycho-social/Spiritual:  Desire for further Chaplaincy support:no Created space and opportunity for patient and family to express thoughts and feelings regarding patient's current medical situation.  Emotional support and therapeutic listening provided.  Prognosis:  days  Discharge Planning:  Anticipated Hospital Death      Primary Diagnoses: Present on Admission:  Chronic heart failure with preserved ejection fraction (HCC)  CKD (chronic kidney disease), symptom management only, stage 5 (HCC)   I have reviewed the medical record, interviewed the patient and family, and examined the patient. The following aspects are pertinent.  Past Medical History:  Diagnosis Date   Abdominal pain 02/25/2023   Abscess 10/30/2022   Acute diastolic congestive heart failure (HCC) 08/31/2022   Acute kidney injury superimposed on CKD (HCC) 04/28/2011   AKI (acute kidney injury) (HCC) 01/31/2020   Anemia    Aortic stenosis    Arthritis    Bilateral lower extremity edema    Bilateral lower extremity edema    Blurry vision, bilateral 05/08/2016   Bradycardia    a. 01/2013 - asymptomatic.   Chronic diastolic heart failure (HCC)    Chronic sinus bradycardia 07/02/2012   CKD (chronic kidney disease)    a. baseline CKD stage III   CKD (chronic kidney disease) stage 4, GFR 15-29 ml/min (HCC) 09/17/2022   Coronary artery disease    Electrolyte abnormality 08/30/2022   Epilepsy (HCC) 06/04/2006   EEG 02/19/12  Interpretation:  This is an abnormal EEG demonstrating continuous and generalized slowing of the background in the theta frequency. Irregular rhythm of EKG noted.  Clinical correlation: the generalized slowing is consistent with a mild encephalopathy of nonspecific etiologies for which the differential would include infectious, toxic, metabolic, inflammatory, and vascular etiologies.    GERD (gastroesophageal reflux disease)    Headache(784.0)    Heart block AV second degree 02/15/2013   Heart murmur    Hyperlipidemia    Hypertension    Hypocalcemia 08/27/2022   Patient with chronic hypocalcemia, found to have a critical calcium  of 5.8 on CMP today.  Albumin 2.7, corrected calcium  6.8.  Likely multifactorial due to CKD, vitamin D  deficiency.  This was discussed extensively with the  patient and family.  Risks and benefits of inpatient management discussed and family decided to continue with current plan of outpatient management with close follow-up.  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 -Orbits: Grossly unremarkable. -Paranasal sinuses: Imaged portions clear. -Brain: No acute abnormalities such as hemorrhage, hydrocephalus, acute ischemia, or evidence of a mass lesion.  There is patchy bilateral cerebral white matter T2 and flair signal hyper intensities.  Centimeter or smaller CSF intensity   Melena 02/25/2023   Mobitz (type) I [facility]) atrioventricular block    a. 01/2013 - asymptomatic.   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   TIA (transient ischemic attack) 2014   pt stated she had mini strokes   UTI (urinary tract infection) 03/07/2022   Vitamin D  deficiency 04/22/2014   Social History   Socioeconomic History   Marital status: Married    Spouse name: Johnny   Number of children: 2   Years of education: 4   Highest education level: 7th grade  Occupational History  Occupation: retired  Tobacco Use   Smoking status: Former    Current packs/day: 0.00    Average packs/day: 0.5 packs/day for 10.0 years (5.0 ttl pk-yrs)    Types: Cigarettes    Start date: 06/04/1990    Quit  date: 06/03/2000    Years since quitting: 23.3   Smokeless tobacco: Never  Vaping Use   Vaping status: Never Used  Substance and Sexual Activity   Alcohol use: No    Alcohol/week: 0.0 standard drinks of alcohol   Drug use: No   Sexual activity: Not Currently    Partners: Male  Other Topics Concern   Not on file  Social History Narrative   Current Social History 06/20/2020        Patient lives with spouse in a home which is 1 story. There are not steps up to the entrance, the patient uses a ramp.       Patient's method of transportation is [employer].      The highest level of education was some high school.      The patient currently disabled.      Identified important Relationships are God and my husband       Pets : None       Interests / Fun: Sitting on my front porch       Current Stressors: None        Son passed away.      Social Drivers of Health   Financial Resource Strain: Patient Declined (09/24/2023)   Overall Financial Resource Strain (CARDIA)    Difficulty of Paying Living Expenses: Patient declined  Food Insecurity: Food Insecurity Present (09/24/2023)   Hunger Vital Sign    Worried About Running Out of Food in the Last Year: Sometimes true    Ran Out of Food in the Last Year: Patient declined  Transportation Needs: No Transportation Needs (09/24/2023)   PRAPARE - Administrator, Civil Service (Medical): No    Lack of Transportation (Non-Medical): No  Physical Activity: Inactive (09/24/2023)   Exercise Vital Sign    Days of Exercise per Week: 0 days    Minutes of Exercise per Session: 0 min  Stress: No Stress Concern Present (09/24/2023)   [employer] of Occupational Health - Occupational Stress Questionnaire    Feeling of Stress: Not at all  Social Connections: Moderately Isolated (09/24/2023)   Social Connection and Isolation Panel    Frequency of Communication with Friends and Family: More than three times a week    Frequency of Social  Gatherings with Friends and Family: More than three times a week    Attends Religious Services: Never    Database administrator or Organizations: No    Attends Engineer, structural: Never    Marital Status: Married   Family History  Problem Relation Age of Onset   Hypertension Other    Heart disease Mother    Hypertension Mother    Cancer Father    Obesity Son    Asthma Sister    Heart disease Sister    Heart disease Sister    Scheduled Meds: Continuous Infusions: PRN Meds:.acetaminophen  **OR** acetaminophen , bisacodyl , glycopyrrolate  **OR** glycopyrrolate  **OR** glycopyrrolate , HYDROmorphone  (DILAUDID ) injection, LORazepam  **OR** LORazepam  **OR** LORazepam , ondansetron  **OR** ondansetron  (ZOFRAN ) IV Medications Prior to Admission:  Prior to Admission medications   Medication Sig Start Date End Date Taking? Authorizing Provider  acetaminophen  (TYLENOL ) 325 MG tablet Take 2 tablets (650 mg total) by mouth every 6 (six)  hours as needed for mild pain (or Fever >/= 101). Patient taking differently: Take 650 mg by mouth every 6 (six) hours as needed for mild pain (pain score 1-3) or moderate pain (pain score 4-6) (or Fever >/= 101). 11/21/22   Jolaine Pac, DO  albuterol  (PROVENTIL ) (2.5 MG/3ML) 0.083% nebulizer solution Take 3 mLs (2.5 mg total) by nebulization every 6 (six) hours as needed for wheezing. 06/25/23   Fernand Prost, MD  amLODipine  (NORVASC ) 10 MG tablet Take 1 tablet (10 mg total) by mouth daily. 06/24/23 09/22/23  Fernand Prost, MD  aspirin  81 MG chewable tablet Chew 1 tablet (81 mg total) by mouth daily. 06/24/23   Fernand Prost, MD  calcium  carbonate (OSCAL) 1500 (600 Ca) MG TABS tablet Take 1 tablet (1,500 mg total) by mouth 3 (three) times daily with meals. 06/25/23   Fernand Prost, MD  cholecalciferol  (VITAMIN D3) 25 MCG (1000 UNIT) tablet Take 1 tablet (1,000 Units total) by mouth daily. 06/25/23 06/24/24  Fernand Prost, MD  cloNIDine  (CATAPRES ) 0.1 MG tablet Take 1 tablet (0.1  mg total) by mouth 2 (two) times daily. 07/11/23   Glena Harlene HERO, FNP  empagliflozin  (JARDIANCE ) 10 MG TABS tablet Take 1 tablet (10 mg total) by mouth daily before breakfast. 05/12/23   Rolan Ezra RAMAN, MD  febuxostat  (ULORIC ) 40 MG tablet Take 0.5 tablets (20 mg total) by mouth daily. 06/25/23 06/24/24  Fernand Prost, MD  hydrALAZINE  (APRESOLINE ) 100 MG tablet TAKE 1 TABLET(100 MG) BY MOUTH THREE TIMES DAILY Patient taking differently: Take 100 mg by mouth 3 (three) times daily. 02/04/23 02/04/24  Fernand Prost, MD  lidocaine  (LMX) 4 % cream Apply 1 Application topically 2 (two) times daily as needed (Pain on sacral wound area). 06/25/23   Fernand Prost, MD  multivitamin (RENA-VIT) TABS tablet Take 1 tablet by mouth at bedtime. 06/25/23   Fernand Prost, MD  pantoprazole  (PROTONIX ) 40 MG tablet TAKE 1 TABLET BY MOUTH DAILY 07/21/23   Fernand Prost, MD  PHENobarbital  (LUMINAL) 64.8 MG tablet Take 2 tablets (129.6 mg total) by mouth at bedtime. 05/23/23   Georjean Darice HERO, MD  potassium chloride  SA (KLOR-CON  M) 20 MEQ tablet Take 1 tablet (20 mEq total) by mouth daily. 07/11/23   [place], Harlene HERO, FNP  rosuvastatin  (CRESTOR ) 40 MG tablet Take 1 tablet (40 mg total) by mouth daily. 06/25/23 06/19/24  Fernand Prost, MD  sevelamer  carbonate (RENVELA ) 800 MG tablet Take 1 tablet (800 mg total) by mouth 3 (three) times daily with meals. 04/14/23 04/13/24  Fernand Prost, MD  torsemide  (DEMADEX ) 20 MG tablet Take 80 mg daily every other day ALTERNATING with 60 mg daily every other day 07/25/23   Glena Harlene HERO, FNP  zonisamide  (ZONEGRAN ) 100 MG capsule TAKE 1 CAPSULE BY MOUTH AT NIGHT 04/24/23   Georjean Darice HERO, MD   Allergies  Allergen Reactions   Ibuprofen Other (See Comments)    Dr advised pt not to take ibuprofen   Lactose Intolerance (Gi) Diarrhea   Review of Systems  Unable to perform ROS: Patient unresponsive    Physical Exam Vitals and nursing note reviewed.  Constitutional:      General: She is not in acute  distress.    Appearance: She is ill-appearing.  Pulmonary:     Effort: No respiratory distress.  Skin:    General: Skin is warm and dry.  Neurological:     Mental Status: She is unresponsive.     Vital Signs: BP (!) 107/50 (BP Location: Left  Arm)   Pulse 64   Temp (!) 97.5 F (36.4 C) (Oral)   Resp 14   SpO2 (!) 88%  Pain Scale: CPOT       SpO2: SpO2: (!) 88 % O2 Device:SpO2: (!) 88 % O2 Flow Rate: .O2 Flow Rate (L/min): 4 L/min  IO: Intake/output summary:  Intake/Output Summary (Last 24 hours) at Oct 16, 2023 1008 Last data filed at 10-16-2023 0058 Gross per 24 hour  Intake 50 ml  Output --  Net 50 ml    LBM:   Baseline Weight:   Most recent weight:       Palliative Assessment/Data: PPS 10%     Time In: 1005 Time Out: 1120 Time Total: 75 minutes  Signed by: Jeoffrey CHRISTELLA Sharps, NP   Please contact Palliative Medicine Team phone at [phone] for questions and concerns.  For individual provider: See Amion  *Portions of this note are a verbal dictation therefore any spelling and/or grammatical errors are due to the Dragon Medical One system interpretation.

## 2023-10-20 NOTE — H&P (Addendum)
 Date: 2023/10/19               Patient Name:  Sherry [name] MRN: [id number]  DOB: 1945/05/31 Age / Sex: 78 y.o., female   PCP: Myrna Bitters, DO         Medical Service: Internal Medicine Teaching Service         Attending Physician: Dr. Mliss Pouch      First Contact: Armando Rossetti, MD    Second Contact: Dr. Libby Blanch, DO          Pager Information: First Contact Pager: [phone]   Second Contact Pager: [phone]   SUBJECTIVE   Chief Complaint: Failure to thrive  History of Present Illness: Sherry [name] is a 78 y.o. female with PMH of HTN, CHF, osteoporosis, osteoarthritis, anemia, aortic stenosis, 1st degree AV block, ASCVD, seizures, HLD, and GERD. She was recently discharged from the hospital on 7/31 with home hospice. The husband is at the bedside and provides the history. Since last discharge, the husband reports that she has not been herself and is having increasing difficulty breathing. He has noticed that she is less alert and interactive, often only looking at him and not engaging in conversation. He does report that he understands that the doctors have told him that there is no longer anything that can be done to prolong her life. He is very worried that she is suffering and does not want her to be. He is visibly upset throughout our conversation.   Upon evaluation of the patient, she would awaken to sternal rub however would not make eye contact or respond to questions. She did begin moaning frequently.    ED Course: Labs significant for  CMP: Na 131, K 6.8, CO2 10, BUN 144, Cr 6.24, Ca 7.6, AG 20   Ammonia 66 BNP >4500 Imaging CXR with cardiomegaly and vascular congestion Received Calcium  gluconate, novalog 5 units Consulted IMTS  Meds:  Patient reported:   No outpatient medications have been marked as taking for the 10/03/23 encounter [facility] Encounter).    Past Medical History CHF CKD stage 5 HTN Multiple lacunar  infarcts Osteoporosis Osteoarthritis Anemia Aortic stenosis ASCVD Seizures HLD GERD  Past Surgical History Past Surgical History:  Procedure Laterality Date   ABDOMINAL HYSTERECTOMY     BIOPSY  02/28/2023   Procedure: BIOPSY;  Surgeon: Burnette Fallow, MD;  Location: [facility] ENDOSCOPY;  Service: Gastroenterology;;   COLONOSCOPY WITH PROPOFOL  N/A 03/02/2023   Procedure: COLONOSCOPY WITH PROPOFOL ;  Surgeon: Saintclair Jasper, MD;  Location: [facility] ENDOSCOPY;  Service: Gastroenterology;  Laterality: N/A;   ESOPHAGOGASTRODUODENOSCOPY (EGD) WITH PROPOFOL  N/A 02/28/2023   Procedure: ESOPHAGOGASTRODUODENOSCOPY (EGD) WITH PROPOFOL ;  Surgeon: Burnette Fallow, MD;  Location: [facility] ENDOSCOPY;  Service: Gastroenterology;  Laterality: N/A;   LEFT AND RIGHT HEART CATHETERIZATION WITH CORONARY ANGIOGRAM N/A 05/24/2013   Procedure: LEFT AND RIGHT HEART CATHETERIZATION WITH CORONARY ANGIOGRAM;  Surgeon: Victory LELON Claudene DOUGLAS, MD;  Location: [facility] CATH LAB;  Service: Cardiovascular;  Laterality: N/A;   LEFT HEART CATH N/A 02/01/2020   Procedure: Left Heart Cath;  Surgeon: Cherrie Toribio SAUNDERS, MD;  Location: [facility] INVASIVE CV LAB;  Service: Cardiovascular;  Laterality: N/A;   POLYPECTOMY  03/02/2023   Procedure: POLYPECTOMY;  Surgeon: Saintclair Jasper, MD;  Location: [facility] ENDOSCOPY;  Service: Gastroenterology;;   RIGHT HEART CATH N/A 02/01/2020   Procedure: RIGHT HEART CATH;  Surgeon: Cherrie Toribio SAUNDERS, MD;  Location: [facility] INVASIVE CV LAB;  Service: Cardiovascular;  Laterality: N/A;     Social:  Lives With:husband Occupation: retired Support: husband Level of Function: Dependent PCP:  Myrna Bitters, DO  Substances: -Tobacco: 5pk year hx -Alcohol: no -Recreational Drug: no  Family History:  Family History  Problem Relation Age of Onset   Hypertension Other    Heart disease Mother    Hypertension Mother    Cancer Father    Obesity Son    Asthma Sister    Heart disease Sister    Heart disease Sister       Allergies: Allergies as of 10/03/2023 - Review Complete 10/03/2023  Allergen Reaction Noted   Ibuprofen Other (See Comments) 10/30/2012   Lactose intolerance (gi) Diarrhea 09/03/2022    Review of Systems: A complete ROS was negative except as per HPI.   OBJECTIVE:   Physical Exam: Blood pressure (!) 140/127, pulse 78, temperature 97.8 F (36.6 C), temperature source Axillary, resp. rate (!) 22, SpO2 97%.  Constitutional: peacefully resting female, arouses to sternal rub with frequent moans Cardiovascular: Systolic ejection murmur noted on right 2nd intercostal space Pulmonary/Chest: increased work of breathing with intermittent gasps. Pitting edema noted in all 4 extremities Abdominal: Distended with pitting edema Skin: warm and dry  Labs: CBC    Component Value Date/Time   WBC 3.8 (L) 10/03/2023 2339   RBC 2.82 (L) 10/03/2023 2339   HGB 8.3 (L) 10/03/2023 2339   HGB 9.2 (L) 06/24/2023 1143   HCT 26.3 (L) 10/03/2023 2339   HCT 29.0 (L) 06/24/2023 1143   PLT 217 10/03/2023 2339   PLT 162 06/24/2023 1143   MCV 93.3 10/03/2023 2339   MCV 90 06/24/2023 1143   MCH 29.4 10/03/2023 2339   MCHC 31.6 10/03/2023 2339   RDW 15.7 (H) 10/03/2023 2339   RDW 13.2 06/24/2023 1143   LYMPHSABS 0.4 (L) 09/15/2023 2055   LYMPHSABS 0.5 (L) 06/24/2023 1143   MONOABS 0.3 09/15/2023 2055   EOSABS 0.1 09/15/2023 2055   EOSABS 0.0 06/24/2023 1143   BASOSABS 0.0 09/15/2023 2055   BASOSABS 0.0 06/24/2023 1143     CMP     Component Value Date/Time   NA 131 (L) 10/03/2023 2339   NA 144 06/24/2023 1143   K 6.8 (HH) 10/03/2023 2339   CL 101 10/03/2023 2339   CO2 10 (L) 10/03/2023 2339   GLUCOSE 71 10/03/2023 2339   BUN 144 (H) 10/03/2023 2339   BUN 95 (HH) 06/24/2023 1143   CREATININE 6.24 (H) 10/03/2023 2339   CREATININE 2.09 (H) 06/15/2021 0901   CREATININE 1.56 (H) 03/23/2015 0914   CALCIUM  7.6 (L) 10/03/2023 2339   CALCIUM  6.1 08/30/2022 1514   PROT 6.7 10/03/2023 2339   PROT  7.1 01/26/2021 0735   ALBUMIN 3.0 (L) 10/03/2023 2339   ALBUMIN 3.8 03/20/2023 1043   AST 43 (H) 10/03/2023 2339   AST 25 06/15/2021 0901   ALT 24 10/03/2023 2339   ALT 15 06/15/2021 0901   ALKPHOS 105 10/03/2023 2339   BILITOT 0.8 10/03/2023 2339   BILITOT 0.2 (L) 06/15/2021 0901   GFRNONAA 6 (L) 10/03/2023 2339   GFRNONAA 24 (L) 06/15/2021 0901   GFRNONAA 40 (L) 09/06/2014 1109   GFRAA  02/01/2020 0744    SPECIMEN HEMOLYZED. HEMOLYSIS MAY AFFECT INTEGRITY OF RESULTS.   GFRAA 47 (L) 09/06/2014 1109    Imaging:  DG Chest Portable 1 View Result Date: 10/31/2023 CLINICAL DATA:  Shortness of breath, sepsis EXAM: PORTABLE CHEST 1 VIEW COMPARISON:  09/15/2023 FINDINGS: Cardiomegaly, vascular congestion. Bilateral lower lobe airspace opacities and probable layering effusions.  Findings are similar to prior study. IMPRESSION: Cardiomegaly, vascular congestion. Probable layering bilateral effusions with bibasilar atelectasis or infiltrates. Electronically Signed   By: Franky Crease M.D.   On: 10-Oct-2023 00:12     EKG: personally reviewed my interpretation is atrial fibrillation  ASSESSMENT & PLAN:   Assessment & Plan by Problem: Active Problems:   CHF (congestive heart failure) (HCC)   End of life care   Hospice care patient   Sherry [name] is a 78 y.o. person living with a history of Diastolic Heart Failure, HTN, severe aortic stenosis, ASCVD, 1st degree AV block who is on home hospice and presented from home via EMS with concern from her daughter about increasing difficulty breathing and admitted for uremic encephalopathy on hospital day 0   3:47: After discussion with the family including her husband and daughter, the decision was made to transition Ms. Sliney to comfort measures only.   Anticipate in-hospital death in the next 24 hours  End of life care Hospice Care patient -This patient was recently discharged from the hospital on 7/31 with home hospice. Her husband is  at bed side and reports that the nurses have been coming and helping to take care of the patient, but that over the last 2 weeks, she has been less interactive with him and having more difficulty breathing.  -He does report that multiple doctors have told him that there is nothing that can be done to prolong her life much further.  -A long conversation was had with the husband about the patients poor prognosis and the likelihood of the patient dying while admitted this hospitalization. He verbalized understanding and that he does not want her to suffer, but is not quite ready to make her comfort care and would like to speak to his daughter first. He would like to ensure that she is not in pain.  -The daughter was contacted and a direct conversation was had with her about the patient's imminent death. She verbalized understanding and that she would make her way to the hospital.  -At this time, will hold off on aggressive treatments until a decision is made by the family.  -Will continue pain management  -Consult to palliative medicine -Consult to spiritual pastoral care  Chronic Diastolic HF Aortic stenosis ASCVD -The patient has a longstanding history of diastolic heart failure and severe aortic stenosis. -Per review of cardiology notes, the pt is not a TAVR or CABG candidate.  -She was evaluated by cardiology last admission who documented that any cardiac treatment at this point would be palliative. -On exam, the patient is extremely volume overloaded and anasarcic. She is able to make some urine, but has been making less.  -At this time, the patient is experiencing decompensated HF and would require aggressive diuresis, however will hold off on this for now until care decisions are made with the immediate family.   Uremic Encephalopathy CKD stage 5 -Pt severely volume overloaded on exam with reported decreasing urine output -BUN 144 today -On exam, pt lethargic with twitches noted.  -At this  time, patient would require dialysis, however she has been evaluated in the past and deemed that she a poor candidate due to poor cardiac health.  -Continue with palliative care.  Best practice: Diet: NPO VTE: None IVF: None,None Code: DNR/DNI  Disposition planning: Prior to Admission Living Arrangement: Home, living with husband Anticipated Discharge Location: Anticipated hospital death within the next 24 hours  Dispo: Admit patient to Observation with expected length of  stay less than 2 midnights.  Signed: Myrna Bitters, DO Internal Medicine Resident  Oct 05, 2023, 3:15 AM  On Call pager: [phone]

## 2023-10-20 NOTE — Progress Notes (Signed)
   2023-10-24 1600  Spiritual Encounters  Type of Visit Initial  Care provided to: Pt and family  Referral source Patient request;Family;Clinical staff  Reason for visit End-of-life  OnCall Visit Yes  Spiritual Framework  Presenting Themes Impactful experiences and emotions;Significant life change  Interventions  Spiritual Care Interventions Made Established relationship of care and support;Compassionate presence;Reflective listening;Supported grief process;Encouragement;Prayer  Intervention Outcomes  Outcomes Connection to spiritual care;Awareness around self/spiritual resourses;Awareness of support;Connected to spiritual community  Spiritual Care Plan  Spiritual Care Issues Still Outstanding No further spiritual care needs at this time (see row info)   Chaplain responded to spiritual consult for EOL.  Family open to visit and asks for prayer.  Chaplain complied with prayer and continued to provide words of encouragement and support.  Chaplain spiritual support services remain available as the need arises.

## 2023-10-20 NOTE — ED Notes (Signed)
 Admitting Provider at bedside.

## 2023-10-20 NOTE — Hospital Course (Addendum)
 Dr Trudy asked about potentially having the hospice nurse/liaison help with referral to [facility] place.  Tried to call [facility] and was unsuccessful x2. Called their national number: [phone]. They will try to have the on-call nurse page our pager. Call them back if no pages this am.   Rock Liaison from [facility] [phone]; called her, too, and no luck. Social work may help with this as well.    10-09-23: Lots of family in the room. The are understanding about what is going on.

## 2023-10-20 DEATH — deceased

## 2023-10-27 ENCOUNTER — Encounter (HOSPITAL_COMMUNITY): Admitting: Cardiology

## 2023-11-25 ENCOUNTER — Ambulatory Visit: Admitting: Neurology
